# Patient Record
Sex: Female | Born: 1940 | Race: White | Hispanic: No | Smoking: Never smoker
Health system: Southern US, Community
[De-identification: ages and names within clinical notes are randomized; demographics above are authoritative.]

## PROBLEM LIST (undated history)

## (undated) ENCOUNTER — Inpatient Hospital Stay: Admission: EM | Payer: Self-pay

## (undated) DIAGNOSIS — K5790 Diverticulosis of intestine, part unspecified, without perforation or abscess without bleeding: Secondary | ICD-10-CM

## (undated) DIAGNOSIS — R112 Nausea with vomiting, unspecified: Secondary | ICD-10-CM

## (undated) DIAGNOSIS — K219 Gastro-esophageal reflux disease without esophagitis: Secondary | ICD-10-CM

## (undated) DIAGNOSIS — M1990 Unspecified osteoarthritis, unspecified site: Secondary | ICD-10-CM

## (undated) DIAGNOSIS — C50919 Malignant neoplasm of unspecified site of unspecified female breast: Secondary | ICD-10-CM

## (undated) DIAGNOSIS — E039 Hypothyroidism, unspecified: Secondary | ICD-10-CM

## (undated) DIAGNOSIS — Z5189 Encounter for other specified aftercare: Secondary | ICD-10-CM

## (undated) DIAGNOSIS — I700 Atherosclerosis of aorta: Secondary | ICD-10-CM

## (undated) DIAGNOSIS — C55 Malignant neoplasm of uterus, part unspecified: Secondary | ICD-10-CM

## (undated) DIAGNOSIS — T4145XA Adverse effect of unspecified anesthetic, initial encounter: Secondary | ICD-10-CM

## (undated) DIAGNOSIS — E7800 Pure hypercholesterolemia, unspecified: Secondary | ICD-10-CM

## (undated) DIAGNOSIS — Z800 Family history of malignant neoplasm of digestive organs: Secondary | ICD-10-CM

## (undated) DIAGNOSIS — K449 Diaphragmatic hernia without obstruction or gangrene: Secondary | ICD-10-CM

## (undated) DIAGNOSIS — K222 Esophageal obstruction: Secondary | ICD-10-CM

## (undated) DIAGNOSIS — T8859XA Other complications of anesthesia, initial encounter: Secondary | ICD-10-CM

## (undated) DIAGNOSIS — Z9889 Other specified postprocedural states: Secondary | ICD-10-CM

## (undated) DIAGNOSIS — I10 Essential (primary) hypertension: Secondary | ICD-10-CM

## (undated) DIAGNOSIS — H269 Unspecified cataract: Secondary | ICD-10-CM

## (undated) DIAGNOSIS — K649 Unspecified hemorrhoids: Secondary | ICD-10-CM

## (undated) DIAGNOSIS — E781 Pure hyperglyceridemia: Secondary | ICD-10-CM

## (undated) HISTORY — DX: Encounter for other specified aftercare: Z51.89

## (undated) HISTORY — PX: TOTAL KNEE ARTHROPLASTY: SHX125

## (undated) HISTORY — DX: Diverticulosis of intestine, part unspecified, without perforation or abscess without bleeding: K57.90

## (undated) HISTORY — DX: Essential (primary) hypertension: I10

## (undated) HISTORY — DX: Unspecified osteoarthritis, unspecified site: M19.90

## (undated) HISTORY — DX: Pure hyperglyceridemia: E78.1

## (undated) HISTORY — DX: Family history of malignant neoplasm of digestive organs: Z80.0

## (undated) HISTORY — PX: UPPER GASTROINTESTINAL ENDOSCOPY: SHX188

## (undated) HISTORY — DX: Hypothyroidism, unspecified: E03.9

## (undated) HISTORY — PX: OVARY SURGERY: SHX727

## (undated) HISTORY — DX: Esophageal obstruction: K22.2

## (undated) HISTORY — DX: Unspecified cataract: H26.9

## (undated) HISTORY — DX: Malignant neoplasm of unspecified site of unspecified female breast: C50.919

## (undated) HISTORY — DX: Unspecified hemorrhoids: K64.9

## (undated) HISTORY — DX: Diaphragmatic hernia without obstruction or gangrene: K44.9

## (undated) HISTORY — DX: Atherosclerosis of aorta: I70.0

## (undated) HISTORY — PX: FOOT SURGERY: SHX648

## (undated) HISTORY — PX: ROTATOR CUFF REPAIR: SHX139

## (undated) HISTORY — DX: Pure hypercholesterolemia, unspecified: E78.00

## (undated) HISTORY — DX: Malignant neoplasm of uterus, part unspecified: C55

## (undated) HISTORY — PX: COLONOSCOPY: SHX174

---

## 1980-07-15 HISTORY — PX: SIMPLE MASTECTOMY: SHX2409

## 1980-07-15 HISTORY — PX: MASTECTOMY, RADICAL: SHX710

## 1998-06-13 ENCOUNTER — Other Ambulatory Visit: Admission: RE | Admit: 1998-06-13 | Discharge: 1998-06-13 | Payer: Self-pay | Admitting: Obstetrics & Gynecology

## 1999-01-02 ENCOUNTER — Ambulatory Visit (HOSPITAL_BASED_OUTPATIENT_CLINIC_OR_DEPARTMENT_OTHER): Admission: RE | Admit: 1999-01-02 | Discharge: 1999-01-02 | Payer: Self-pay | Admitting: Plastic Surgery

## 1999-08-27 ENCOUNTER — Other Ambulatory Visit: Admission: RE | Admit: 1999-08-27 | Discharge: 1999-08-27 | Payer: Self-pay | Admitting: Obstetrics & Gynecology

## 1999-11-20 ENCOUNTER — Other Ambulatory Visit: Admission: RE | Admit: 1999-11-20 | Discharge: 1999-11-20 | Payer: Self-pay | Admitting: Obstetrics & Gynecology

## 1999-11-20 ENCOUNTER — Encounter (INDEPENDENT_AMBULATORY_CARE_PROVIDER_SITE_OTHER): Payer: Self-pay | Admitting: Specialist

## 2001-01-30 ENCOUNTER — Inpatient Hospital Stay (HOSPITAL_COMMUNITY): Admission: RE | Admit: 2001-01-30 | Discharge: 2001-02-01 | Payer: Self-pay | Admitting: Specialist

## 2001-04-13 ENCOUNTER — Encounter: Payer: Self-pay | Admitting: Emergency Medicine

## 2001-04-13 ENCOUNTER — Emergency Department (HOSPITAL_COMMUNITY): Admission: EM | Admit: 2001-04-13 | Discharge: 2001-04-13 | Payer: Self-pay | Admitting: Emergency Medicine

## 2001-04-27 ENCOUNTER — Other Ambulatory Visit: Admission: RE | Admit: 2001-04-27 | Discharge: 2001-04-27 | Payer: Self-pay | Admitting: Obstetrics & Gynecology

## 2001-07-31 ENCOUNTER — Encounter: Payer: Self-pay | Admitting: Specialist

## 2001-07-31 ENCOUNTER — Observation Stay (HOSPITAL_COMMUNITY): Admission: RE | Admit: 2001-07-31 | Discharge: 2001-08-01 | Payer: Self-pay | Admitting: Specialist

## 2002-12-01 ENCOUNTER — Other Ambulatory Visit: Admission: RE | Admit: 2002-12-01 | Discharge: 2002-12-01 | Payer: Self-pay | Admitting: Obstetrics & Gynecology

## 2004-02-17 ENCOUNTER — Other Ambulatory Visit: Admission: RE | Admit: 2004-02-17 | Discharge: 2004-02-17 | Payer: Self-pay | Admitting: Obstetrics & Gynecology

## 2006-09-03 ENCOUNTER — Ambulatory Visit: Payer: Self-pay | Admitting: Internal Medicine

## 2006-09-04 ENCOUNTER — Ambulatory Visit: Payer: Self-pay | Admitting: Internal Medicine

## 2006-10-21 ENCOUNTER — Ambulatory Visit: Payer: Self-pay | Admitting: Internal Medicine

## 2006-10-23 ENCOUNTER — Ambulatory Visit (HOSPITAL_COMMUNITY): Admission: RE | Admit: 2006-10-23 | Discharge: 2006-10-23 | Payer: Self-pay | Admitting: Gastroenterology

## 2006-10-30 ENCOUNTER — Ambulatory Visit: Payer: Self-pay | Admitting: Gastroenterology

## 2006-11-05 ENCOUNTER — Encounter: Payer: Self-pay | Admitting: Gastroenterology

## 2007-05-07 ENCOUNTER — Encounter: Payer: Self-pay | Admitting: Gastroenterology

## 2007-05-07 ENCOUNTER — Ambulatory Visit (HOSPITAL_COMMUNITY): Admission: RE | Admit: 2007-05-07 | Discharge: 2007-05-07 | Payer: Self-pay | Admitting: Gastroenterology

## 2007-05-15 ENCOUNTER — Ambulatory Visit: Payer: Self-pay | Admitting: Gastroenterology

## 2007-07-27 ENCOUNTER — Ambulatory Visit: Payer: Self-pay | Admitting: Internal Medicine

## 2007-08-06 ENCOUNTER — Ambulatory Visit: Payer: Self-pay | Admitting: Internal Medicine

## 2007-11-04 ENCOUNTER — Inpatient Hospital Stay (HOSPITAL_COMMUNITY): Admission: RE | Admit: 2007-11-04 | Discharge: 2007-11-08 | Payer: Self-pay | Admitting: Orthopedic Surgery

## 2008-09-27 ENCOUNTER — Telehealth (INDEPENDENT_AMBULATORY_CARE_PROVIDER_SITE_OTHER): Payer: Self-pay | Admitting: *Deleted

## 2008-11-21 ENCOUNTER — Inpatient Hospital Stay (HOSPITAL_COMMUNITY): Admission: RE | Admit: 2008-11-21 | Discharge: 2008-11-24 | Payer: Self-pay | Admitting: Orthopedic Surgery

## 2009-08-22 ENCOUNTER — Telehealth: Payer: Self-pay | Admitting: Gastroenterology

## 2009-10-19 ENCOUNTER — Encounter: Payer: Self-pay | Admitting: Gastroenterology

## 2009-10-19 ENCOUNTER — Telehealth: Payer: Self-pay | Admitting: Gastroenterology

## 2009-10-19 ENCOUNTER — Encounter (INDEPENDENT_AMBULATORY_CARE_PROVIDER_SITE_OTHER): Payer: Self-pay | Admitting: *Deleted

## 2009-10-19 DIAGNOSIS — K319 Disease of stomach and duodenum, unspecified: Secondary | ICD-10-CM | POA: Insufficient documentation

## 2009-10-20 ENCOUNTER — Telehealth (INDEPENDENT_AMBULATORY_CARE_PROVIDER_SITE_OTHER): Payer: Self-pay | Admitting: *Deleted

## 2009-11-16 ENCOUNTER — Ambulatory Visit: Payer: Self-pay | Admitting: Gastroenterology

## 2009-11-16 ENCOUNTER — Ambulatory Visit (HOSPITAL_COMMUNITY): Admission: RE | Admit: 2009-11-16 | Discharge: 2009-11-16 | Payer: Self-pay | Admitting: Gastroenterology

## 2010-08-14 NOTE — Miscellaneous (Signed)
Summary: eus  Clinical Lists Changes  Problems: Added new problem of OTHER SPECIFIED DISORDER OF STOMACH AND DUODENUM (ICD-537.89) Orders: Added new Test order of EUS-Upper (EUS-Upper) - Signed

## 2010-08-14 NOTE — Procedures (Signed)
Summary: eus report   EUS  Procedure date:  05/07/2007  Findings:      Location: [facility]   Patient Name: Vicki Cervantes, Vicki Cervantes MRN:  Procedure Procedures: Panendoscopy with EUSCPT: [id number].  Personnel: Endoscopist: Rachael Fee, MD.  Exam Location: Exam performed in Endoscopy Suite. Outpatient  Patient Consent: Procedure, Alternatives, Risks and Benefits discussed, consent obtained, from patient. Consent was obtained by the RN.  Indications Staging: gastric subepithelial lesion, last seen 4/08 (14.21mm by 11.86mm, round, contiguous with MP layer), here to check for interval growth.  History  Current Medications: Patient is not currently taking Coumadin.  Pre-Exam Physical: Performed May 07, 2007. Cardio-pulmonary exam, Abdominal exam, Mental status exam WNL.  Comments: Pt. history reviewed/updated, physical exam performed prior to initiation of sedation? yes Exam Exam: Images were taken.  Patient: ASA Classification: II. Tolerance: good.  Sedation Meds:  ~OBJECTIVE5Sedation Meds Patient assessed and found to be appropriate for moderate (conscious) sedation. Fentanyl 100 mcg. given IV. Versed 10 mg. given IV.  Monitoring: BP and pulse monitoring done. Oximetry was used. Supplemental O2 given.  EUS Scopes: Radial Echoendoscope used   Comments: Endoscopic findings (limited examination): 1. Normal esophagus. 2. Moderate amount of retained solid food in stomach.  1-2cm round, subepithelial lesion in proximal stomach, normal overlying mucosa.  Otherwise normal stomach. 3. Normal duodenum.  EUS findings: 1. 13.57mm by 13.78mm round, well demarcated, hypoechoic, homogeneous lesion that is contiguous with the muscularis propria layer of the proximal gastric wall.  This corresponds with the subepithelial lesion described above. 2. No perigastric adenopathy. 3. Limited views of pancreas, liver were all normal Assessment Upper GI Abnormal examination, see  findings above.  Comments: 13.2mm by 13.27mm subepithelial lesion that is contiguous with the muscularis propria layer of proximal gastric wall.  This is small and has not significantly changed in 6 months and so FNA was not performed (previously measured 14.72mm by 11.49mm).  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 Events  Unplanned Intervention: No intervention was required.  Unplanned Events: There were no complications. Plans Comments: My office will arrange for repeat EUS in 18 months to check for interval change.  If there is no significant growth, I do not recommend further surveillance.    This report was created from the original endoscopy report, which was reviewed and signed by the above listed endoscopist.

## 2010-08-14 NOTE — Progress Notes (Signed)
   Phone Note Outgoing Call   Summary of Call: Message left for pt. to call back NW:[id number] of EUS scheduled for 11/16/09 and need to know where to mail instructions . Initial call taken by: Teryl Lucy RN,  October 20, 2009 9:17 AM  Follow-up for Phone Call        Instructions mailed to pt. Follow-up by: Teryl Lucy RN,  October 24, 2009 10:25 AM

## 2010-08-14 NOTE — Progress Notes (Signed)
Summary: ready to sch proc   Phone Note Call from Patient Call back at [phone]   Caller: Patient Call For: Shabre Kreher Reason for Call: Talk to Nurse Summary of Call: Patient wants to schedule procedure (EUS) Initial call taken by: Tawni Levy,  October 19, 2009 11:34 AM  Follow-up for Phone Call        Message left for pt. that i will discuss with Dr.Jacob's and we will call her back to schedule   .sign .     Appended Document: ready to sch proc Pt. still in [place] until the end of April.Would like to be scheduled for 11/16/09 so told I will get her scheduled and call her back.  Appended Document: ready to sch proc PT. SCHEDULED FOR UPPER EUS ON 11/16/09.iNSTRUCTIONS DISCUSSED WITH PT. ON THE PHONE AND MAILED TO HER.

## 2010-08-14 NOTE — Procedures (Signed)
Summary: EUS   EUS  Procedure date:  11/05/2006  Findings:      Location: [facility]    EUS  Procedure date:  11/05/2006  Findings:      Location: [facility]   Patient Name: Vicki, Cervantes MRN:  Procedure Procedures: Panendoscopy with EUSCPT: [id number].  Personnel: Endoscopist: Rachael Fee, MD.  Exam Location: Exam performed in Endoscopy Suite. Outpatient  Patient Consent: Procedure, Alternatives, Risks and Benefits discussed, consent obtained, from patient. Consent was obtained by the RN.  Indications Staging: gastric subepithelial lesion, last seen 4/08 (14.54mm by 11.41mm, round, contiguous with MP layer), here to check for interval growth.  History  Current Medications: Patient is not currently taking Coumadin.  Pre-Exam Physical: Performed May 07, 2007. Cardio-pulmonary exam, Abdominal exam, Mental status exam WNL.  Comments: Pt. history reviewed/updated, physical exam performed prior to initiation of sedation? yes Exam Exam: Images were taken.  Patient: ASA Classification: II. Tolerance: good.  Sedation Meds:  ~OBJECTIVE5Sedation Meds Patient assessed and found to be appropriate for moderate (conscious) sedation. Fentanyl 100 mcg. given IV. Versed 10 mg. given IV.  Monitoring: BP and pulse monitoring done. Oximetry was used. Supplemental O2 given.  EUS Scopes: Radial Echoendoscope used   Comments: Endoscopic findings (limited examination): 1. Normal esophagus. 2. Moderate amount of retained solid food in stomach.  1-2cm round, subepithelial lesion in proximal stomach, normal overlying mucosa.  Otherwise normal stomach. 3. Normal duodenum.  EUS findings: 1. 13.78mm by 13.38mm round, well demarcated, hypoechoic, homogeneous lesion that is contiguous with the muscularis propria layer of the proximal gastric wall.  This corresponds with the subepithelial lesion described above. 2. No perigastric adenopathy. 3. Limited views  of pancreas, liver were all normal Assessment Upper GI Abnormal examination, see findings above.  Comments: 13.53mm by 13.18mm subepithelial lesion that is contiguous with the muscularis propria layer of proximal gastric wall.  This is small and has not significantly changed in 6 months and so FNA was not performed (previously measured 14.53mm by 11.70mm).  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 Events  Unplanned Intervention: No intervention was required.  Unplanned Events: There were no complications. Plans Comments: My office will arrange for repeat EUS in 18 months to check for interval change.  If there is no significant growth, I do not recommend further surveillance.   CC: Yancey Flemings, MD   This report was created from the original endoscopy report, which was reviewed and signed by the above listed endoscopist.

## 2010-08-14 NOTE — Procedures (Signed)
Summary: Endoscopic Ultrasound  Patient: Vicki Cervantes Note: All result statuses are Final unless otherwise noted.  Tests: (1) Endoscopic Ultrasound (EUS)  EUS Endoscopic Ultrasound                             DONE     [facility]     [address] 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  The  endoscope was introduced through the     mouth and advanced to the distal stomach.     <<PROCEDUREIMAGES>>           Endoscopic findings:     1. Normal esophagus     2. Medium to large amount of retained solid food in stomach,     patent pylorus     3. Previously noted smooth, round, subepithelial lesion in     proximal stomach was noted, appeared unchanged in size           EUS findings:     1. The subepithelial lesion above corresponded with a discrete,     solid, hypoechoic, homogeneous mass that communicates with the     muscularis propria layer of the proximal gastric wall. This     measured 15.63mm by 12.82mm maximally.     2. There was no perigastric or celiac adenopathy.           Impression:     15.58mm by 12.44mm round, discrete subepithelial lesion that may     have increased slightly in size since last EUS 2 1/2 years ago     (was 13.70mm by 13.39mm). This is either a leiomyoma or small GIST.     At this size (<2cm) surgical resection is  not necessary.  Dr.     Christella Hartigan' office will arrange for repeat EUS in 2 years to check for     further interval growth.     Retained solid food in stomach suggests gastroparesis, however she     has not had any signficant UGI symptoms.           ______________________________     Rachael Fee, MD           cc: Yancey Flemings, MD           n.     Rosalie Doctor:   Rachael Fee at 11/16/2009 09:14 AM           [name], [id number]  Note: An exclamation mark (!) indicates  a result that was not dispersed into the flowsheet. Document Creation Date: 11/16/2009 9:15 AM _______________________________________________________________________  (1) Order result status: Final Collection or observation date-time: 11/16/2009 09:03 Requested date-time:  Receipt date-time:  Reported date-time:  Referring Physician:   Ordering Physician: Rob Bunting [phone]) Specimen Source:  Source: Launa Grill Order Number: [id number] Lab site:   Appended Document: Endoscopic Ultrasound patty, she needs recall upper EUS in 2 years  Appended Document: Endoscopic Ultrasound    Clinical Lists Changes  Observations: Added new observation of EUS: 11/2011 (11/16/2009 9:19)

## 2010-08-14 NOTE — Progress Notes (Signed)
Summary: Schedule EUS   Phone Note Outgoing Call Call back at [facility] Phone [phone]   Call placed by: Harlow Mares CMA Duncan Dull),  August 22, 2009 9:34 AM Call placed to: Patient Summary of Call: patient needs EUS, 60 min upper radial +/- linear ( per paper chart recall ). Left message on patients machine to call back.  Initial call taken by: Harlow Mares CMA Duncan Dull),  August 22, 2009 9:34 AM  Follow-up for Phone Call        Left message on patients machine to call back.  Follow-up by: Harlow Mares CMA Duncan Dull),  August 24, 2009 4:14 PM

## 2010-08-14 NOTE — Letter (Signed)
Summary: EGD Instructions  [facility]  [address] [place], [place] [address]   Phone: [phone]  Fax: [phone]       Vicki Cervantes    Nov 09, 1940    MRN: [id number]       Procedure Day /Date:11/16/2009     Arrival Time:7:30 am     Procedure Time:8:30 am     Location of Procedure:                    _  _ [facility] (4th Floor) _ X _ [facility] ( Outpatient Registration) _  _ [facility] (Short Stay on [address]   PREPARATION FOR ENDOSCOPY/ eus   OnThursday 11/16/09 THE DAY OF THE PROCEDURE:  1.   No solid foods, milk or milk products are allowed after midnight the night before your procedure.  2.   Do not drink anything colored red or purple.  Avoid juices with pulp.  No orange juice.  3.  You may drink clear liquids until4:30 a.m. which is 4 hours before your procedure.                                                                                                CLEAR LIQUIDS INCLUDE: Water Jello Ice Popsicles Tea (sugar ok, no milk/cream) Powdered fruit flavored drinks Coffee (sugar ok, no milk/cream) Gatorade Juice: apple, white grape, white cranberry  Lemonade Clear bullion, consomm, broth Carbonated beverages (any kind) Strained chicken noodle soup Hard Candy   MEDICATION INSTRUCTIONS  Unless otherwise instructed, you should take regular prescription medications with a small sip of water as early as possible the morning of your procedure.       OTHER INSTRUCTIONS  You will need a responsible adult at least 71 years of age to accompany you and drive you home.   This person must remain in the waiting room during your procedure.  Wear loose fitting clothing that is easily removed.  Leave jewelry and other valuables at home.  However, you may wish to bring a book to read or an iPod/MP3 player to listen to music as you wait for your procedure to start.  Remove all body piercing jewelry and leave at  home.  Total time from sign-in until discharge is approximately 2-3 hours.  You should go home directly after your procedure and rest.  You can resume normal activities the day after your procedure.  The day of your procedure you should not:   Drive   Make legal decisions   Operate machinery   Drink alcohol   Return to work  You will receive specific instructions about eating, activities and medications before you leave.    The above instructions have been reviewed and explained to me by  Elnita Maxwell -10/19/09-mailed to pt.

## 2010-10-23 LAB — PROTIME-INR
INR: 1.1 (ref 0.00–1.49)
INR: 1.5 (ref 0.00–1.49)
INR: 1.9 — ABNORMAL HIGH (ref 0.00–1.49)
Prothrombin Time: 14 seconds (ref 11.6–15.2)
Prothrombin Time: 15 seconds (ref 11.6–15.2)

## 2010-10-23 LAB — TYPE AND SCREEN
ABO/RH(D): A POS
Antibody Screen: NEGATIVE

## 2010-10-23 LAB — CBC
HCT: 22.6 % — ABNORMAL LOW (ref 36.0–46.0)
HCT: 24.1 % — ABNORMAL LOW (ref 36.0–46.0)
HCT: 27.3 % — ABNORMAL LOW (ref 36.0–46.0)
HCT: 36.2 % (ref 36.0–46.0)
Hemoglobin: 12.6 g/dL (ref 12.0–15.0)
Hemoglobin: 7.9 g/dL — CL (ref 12.0–15.0)
MCHC: 34.7 g/dL (ref 30.0–36.0)
MCHC: 35.2 g/dL (ref 30.0–36.0)
MCHC: 35.5 g/dL (ref 30.0–36.0)
MCV: 90.5 fL (ref 78.0–100.0)
Platelets: 155 10*3/uL (ref 150–400)
Platelets: 158 10*3/uL (ref 150–400)
Platelets: 166 10*3/uL (ref 150–400)
Platelets: 183 10*3/uL (ref 150–400)
RDW: 12.6 % (ref 11.5–15.5)
RDW: 12.6 % (ref 11.5–15.5)
RDW: 12.8 % (ref 11.5–15.5)
RDW: 12.9 % (ref 11.5–15.5)
RDW: 13 % (ref 11.5–15.5)
WBC: 10 10*3/uL (ref 4.0–10.5)
WBC: 6.8 10*3/uL (ref 4.0–10.5)
WBC: 8.4 10*3/uL (ref 4.0–10.5)

## 2010-10-23 LAB — BASIC METABOLIC PANEL
BUN: 11 mg/dL (ref 6–23)
BUN: 6 mg/dL (ref 6–23)
CO2: 29 mEq/L (ref 19–32)
CO2: 29 mEq/L (ref 19–32)
Calcium: 7.9 mg/dL — ABNORMAL LOW (ref 8.4–10.5)
Chloride: 99 mEq/L (ref 96–112)
Creatinine, Ser: 0.94 mg/dL (ref 0.4–1.2)
Creatinine, Ser: 1.04 mg/dL (ref 0.4–1.2)
GFR calc non Af Amer: 59 mL/min — ABNORMAL LOW (ref >60)
Glucose, Bld: 134 mg/dL — ABNORMAL HIGH (ref 70–99)
Glucose, Bld: 148 mg/dL — ABNORMAL HIGH (ref 70–99)

## 2010-10-23 LAB — URINALYSIS, ROUTINE W REFLEX MICROSCOPIC
Bilirubin Urine: NEGATIVE
Glucose, UA: NEGATIVE mg/dL
Hgb urine dipstick: NEGATIVE
Ketones, ur: NEGATIVE mg/dL
Nitrite: NEGATIVE
Protein, ur: NEGATIVE mg/dL
Specific Gravity, Urine: 1.02 (ref 1.005–1.030)
Urobilinogen, UA: 0.2 mg/dL (ref 0.0–1.0)
pH: 6.5 (ref 5.0–8.0)

## 2010-10-23 LAB — COMPREHENSIVE METABOLIC PANEL
Alkaline Phosphatase: 80 U/L (ref 39–117)
BUN: 25 mg/dL — ABNORMAL HIGH (ref 6–23)
Calcium: 9.7 mg/dL (ref 8.4–10.5)
GFR calc non Af Amer: 50 mL/min — ABNORMAL LOW (ref >60)
Glucose, Bld: 105 mg/dL — ABNORMAL HIGH (ref 70–99)
Total Protein: 7 g/dL (ref 6.0–8.3)

## 2010-10-23 LAB — PREPARE RBC (CROSSMATCH)

## 2010-11-27 NOTE — Op Note (Signed)
NAMEREBACCA, VOTAW NO.:  [id number]   MEDICAL RECORD NO.:  [id number]          PATIENT TYPE:  INP   LOCATION:  0002                         FACILITY:  [facility]   PHYSICIAN:  Ollen Gross, M.D.    DATE OF BIRTH:  13-Oct-1940   DATE OF PROCEDURE:  11/04/2007  DATE OF DISCHARGE:                               OPERATIVE REPORT   PREOPERATIVE DIAGNOSIS:  Osteoarthritis right knee.   POSTOPERATIVE DIAGNOSIS:  Osteoarthritis right knee.   PROCEDURE:  Right total knee arthroplasty.   SURGEON:  Ollen Gross, M.D.   ASSISTANT:  Alexzandrew L. Perkins, P.A.C.   ANESTHESIA:  General with postop Marcaine pain pump.   ESTIMATED BLOOD LOSS:  Minimal.   DRAIN:  None.   TOURNIQUET TIME:  29 minutes at 300 mmHg.   COMPLICATIONS:  None.   CONDITION:  Stable to recovery.   BRIEF CLINICAL NOTE:  [name] is a 70 year old female who has end-  stage arthritis of both knees with the right more symptomatic than the  left.  She has had a previous unicompartmental replacement on the left  which has failed.  She has end-stage arthritis on the right and presents  now for right total knee arthroplasty.   PROCEDURE IN DETAIL:  After the successful administration of general  anesthetic, a tourniquet was placed high on the right thigh and the  right lower extremity prepped and draped in the usual sterile fashion.  The extremity was wrapped in Esmarch, knee flexed and the tourniquet  inflated to 300 mmHg.  A midline incision was made with a 10 blade  through subcutaneous tissue to the level of the extensor mechanism.  A  fresh blade was used to make a medial parapatellar arthrotomy.  Soft  tissue over the proximal medial tibia was subperiosteally elevated to  the joint line with a knife and into the semimembranosus bursa with a  Cobb elevator.  The soft tissue laterally was elevated with attention  being paid to avoid any patellar tendon on the tibial tubercle.  The  patella  subluxed laterally.  The knee flexed 90 degrees, ACL and PCL  removed.  A drill was used to create a starting hole in the distal femur  and the canal was thoroughly irrigated.  The 5 degree right valgus  alignment guide was placed and _referencing off the  posterior condyles  rotation is marked and the block pinned to remove 10 mm off the distal  femur.  Distal femoral resection was made with an oscillating saw.  A  sizing block was placed and size 2.5 was the most appropriate.  Rotation  was marked at the epicondylar access and a size 2.5 cutting block  placed.  The anterior, posterior and chamfer cuts were then made.   The tibia was subluxed forward and the menisci removed.  Extramedullary  tibial alignment guide was placed referencing proximally at the medial  aspect of the tibial tubercle and distally along the second metatarsal  access and tibial crest.  The block is pinned to remove 10 mm of the  nondeficient lateral side.  Tibial  resection was made with an  oscillating saw.  A size 2.5 was also the most appropriate tibial  component and the proximal tibia was prepared with a modular drill and  keel punch for a size 2.5.  Femoral preparation was completed with an  intercondylar cut.   The size 2.5 mobile barring tibial trial, size 2.5 posterior stabilized  femoral trial and a 10 mm posterior stabilized rotating platform insert  trial was placed.  With a 10, she slightly hyperextended, so we went to  a 12.5, which allowed for full extension with excellent varus and valgus  balance, as well as anterior-posterior balance throughout full range of  motion.  The patella was then everted and the thickness measured to be  20 mm.  Free hand resection was taken down to 12 mm, 35 template was  placed, lug holes were drilled, trial patella was placed and it tracked  normally.  Osteophytes were removed off the posterior femur with the  trial in place.  All trials were removed and then the cut  down surfaces  were prepared with pulsatile lavage.  Cement was mixed and once ready  for implantation, the size 2.5 mm mobile barring tibial tray, 2.5  posterior stabilized femur and 35 patella were cemented into place and  the patella was held with a clamp.  Trial 12.5 mm insert was placed,  knee held in full extension and all extruded cement removed.  Once the  cement was fully hardened, then FloSeal was injected on the posterior  capsule and the permanent 12.5 mm posterior stabilized rotating platform  insert was placed into the tibial tray.  FloSeal was injected in the  medial and lateral gutters and suprapatellar area.  A moist sponge was  placed and then tourniquet released for a total time of 29 minutes.  The  sponge was held for 2 minutes and then removed.  Minimal bleeding was  encountered.  That which was encountered was stopped with  electrocautery.  The wound was then further irrigated with saline  solution with pulsatile lavage.  The extensor mechanism was then closed  with interrupted #1 PDS.  Flexion against gravity is 140 degrees.  The  subcu was then closed with interrupted 2-0 Vicryl and subcuticular  running 4-0 Monocryl.  The incision was cleaned and dried and Steri-  Strips and a bulky sterile dressing applied.  She was then awakened and  transported to recovery in stable condition.      Ollen Gross, M.D.  Electronically Signed     FA/MEDQ  D:  11/04/2007  T:  11/04/2007  Job:  [id number]

## 2010-11-27 NOTE — Op Note (Signed)
NAMELAYCI, STENGLEIN                ACCOUNT NO.:  [id number]   MEDICAL RECORD NO.:  [id number]          PATIENT TYPE:  INP   LOCATION:  0003                         FACILITY:  [facility]   PHYSICIAN:  Ollen Gross, M.D.    DATE OF BIRTH:  09-10-1940   DATE OF PROCEDURE:  11/21/2008  DATE OF DISCHARGE:                               OPERATIVE REPORT   PREOPERATIVE DIAGNOSIS:  Failed left knee unicompartmental replacement.   POSTOPERATIVE DIAGNOSIS:  Failed left knee unicompartmental replacement.   PROCEDURES:  Revision of left knee unicompartmental replacement to total  knee arthroplasty.   SURGEON:  F. Aluisio, M.D.   ASSISTANT:  Avel Peace PA-C   ANESTHESIA:  Spinal.   ESTIMATED BLOOD LOSS:  Minimal.   DRAIN:  None.   TOURNIQUET TIME:  34 minutes at 300 mmHg.   COMPLICATIONS:  None.   CONDITION:  Stable to recovery room.   BRIEF CLINICAL NOTE:  Ms. Ziemba is a 70 year old female who has had a  painful left unicompartmental knee replacement for several years now.  Radiographs have shown that the tibial component has tilted and  migrated.  She also has compensatory arthritis which has formed  patellofemoral and lateral compartments.  She presents now for revision  to a total knee arthroplasty.   PROCEDURE IN DETAIL:  After successful administration of spinal  anesthetic, a tourniquet is placed on the left thigh and left lower  extremity is prepped and draped in the usual sterile fashion.  Extremities wrapped in Esmarch, knee flexed, tourniquet inflated to 300  mmHg.  Midline incision is made with 10 blade through subcutaneous  tissue to the level of the extensor mechanism.  A fresh blade is used to  make a medial parapatellar arthrotomy.  It is noted that the femoral  component appears somewhat tilted and is not centered on the medial  condyle.  This also growth of spurs overlying the metallic femoral  component.  The soft tissue of the proximal medial tibia  subperiosteally  elevated to the joint line with the knife into the semimembranosus bursa  with a Cobb elevator.  Soft tissue laterally is elevated with attention  being paid to avoid the patellar tendon on tibial tubercle.  Patella  subluxed laterally, knee flexed 90 degrees and PCL removed.  The ACL was  already gone.  I used a quarter inch osteotome to lift the femoral  component off of the bone.  It was removed very easily.  We then used a  drill  to create a starting hole in the distal femur.  The canal is  thoroughly irrigated and the 5-degree left valgus alignment guide is  placed.  We referenced off the posterior condyles for rotation and  pinned block to remove 10 mm off the distal femur.  Distal femoral  resection is made with an oscillating saw.  A sizing block is placed,  size 2 is most appropriate.  We were careful to utilize the epicondylar  axis for rotation as there was a deficiency in the posterior femur which  would have led to too much external rotation if  we referenced off that.  Using the epicondylar axis for rotation, we marked rotation and the  block is pinned.  The anterior-posterior and chamfer cuts were then made  for a size 2.   The tibia is subluxed forward and the menisci are removed.  The tibial  component is somewhat subsided.  We placed the extramedullary cutting  guide referencing at the medial aspect of the tibial tubercle and  distally along the second metatarsal axis and the tibial crest.  I  placed the block to remove about 2 mm below the subsided tibial  component.  Tibial resection is made with an oscillating saw.  We got  below the component and the bone looked great medially.  Size 2 is the  most appropriate tibial component and we prepared the proximal tibia  with the modular drill and keel punch for the MBT revision tray size 2.  We then did the intercondylar cut on the femur for a size 2 and placed  the size 2 posterior stabilized femur.  I  then placed a 15-mm posterior  stabilized rotating platform insert.  With the 15, full extension is  achieved with excellent varus-valgus and anterior-posterior balance  throughout full range of motion.  The patella is then everted and  thickness measured to be 22 mm.  Freehand resection is taken to 12 mm,  35 template is placed, lug holes are drilled, trial patella is placed  and it tracks normally.  Osteophytes are removed off the posterior femur  with the trial in place.  All trials removed and the cut bone surfaces  are prepared with pulsatile lavage.  Cement is mixed and once ready for  implantation, the size 2 MBT revision tray size 2 posterior stabilized  femur and 35 patella are cemented into place.  The patella is held with  the clamp.  Trial 15-mm inserts placed, knee held in full extension and  all extruded cement removed.  When the cement is fully hardened, then  the permanent 15 mm posterior stabilized rotating platform insert is  placed into the tibial tray.  The wound is copiously irrigated with  saline solution and FloSeal injected on the posterior capsule,  mediolateral gutters and suprapatellar area.  A moist sponge is placed  and tourniquet released for a total time of 34 minutes.  Sponges held  for 2 minutes and then removed.  Minimal bleeding is encountered.  The  bleeding that is encountered is stopped with electrocautery.  Wounds  copiously irrigated with saline solution and then the arthrotomy closed  with interrupted #1 PDS.  Flexion against gravity is about 140 degrees.  Subcu is closed with interrupted 2-0 Vicryl and subcuticular running 4-0  Monocryl.  Incisions cleaned and dried and Steri-Strips and a bulky  sterile dressing applied.  She is then placed into a knee immobilizer,  awakened and transferred to recovery in stable condition.      Ollen Gross, M.D.  Electronically Signed     FA/MEDQ  D:  11/21/2008  T:  11/21/2008  Job:  [id number]

## 2010-11-27 NOTE — H&P (Signed)
NAMEKAETLIN, BULLEN                ACCOUNT NO.:  [id number]   MEDICAL RECORD NO.:  [id number]          PATIENT TYPE:  INP   LOCATION:  NA                           FACILITY:  [facility]   PHYSICIAN:  Ollen Gross, M.D.    DATE OF BIRTH:  15-Oct-1940   DATE OF ADMISSION:  11/21/2008  DATE OF DISCHARGE:                              HISTORY & PHYSICAL   DATE OF OFFICE VISIT HISTORY AND PHYSICAL:  Nov 15, 2008.   CHIEF COMPLAINT:  Right knee pain.   HISTORY OF PRESENT ILLNESS:  The patient is a 70 year old female who has  been seen by Dr. Lequita Halt for ongoing left knee pain.  She has known  arthritis that has been progressive in nature.  She has undergone a  right total knee and is felt to benefit from undergoing a left knee.  The risks and benefits have been discussed, she would like to proceed  with surgery.   ALLERGIES:  No known drug allergies.   CURRENT MEDICATIONS:  Levothroid, Celebrex, Evista, Diovan HCT, Lipitor,  Lexapro, omeprazole.   PAST MEDICAL HISTORY:  1. Breast cancer.  2. Insomnia.  3. Hypertension.  4. Hypercholesterolemia.  5. Reflux disease.  6. History of esophageal strictures.  7. Hypothyroidism.  8. Postmenopausal.   PAST SURGICAL HISTORY:  1. Right total knee April 2009.  2. Ovarian cyst excision.  3. Radical left mastectomy and then a right simple mastectomy.  4. Left knee surgery.  5. Shoulder surgery.  6. EGD with esophageal dilatation.   SOCIAL HISTORY:  Married, nonsmoker.  One glass of wine.  She does have  a caregiver lined up.  Two level home with 2 steps entering.   FAMILY HISTORY:  Mother with hypertension.   REVIEW OF SYSTEMS:  GENERAL:  No fevers, chills, night sweats.  NEURO:  No seizures, syncope, paralysis.  RESPIRATORY:  No shortness breath,  productive cough or hemoptysis.  CARDIOVASCULAR:  No chest pain, angina,  orthopnea.  GI:  No nausea, vomiting, diarrhea or constipation.  GU: No  dysuria, hematuria or discharge.   MUSCULOSKELETAL:  Left knee.   PHYSICAL EXAMINATION:  VITAL SIGNS:  Pulse 68, respirations 14, blood  pressure 138/72.  GENERAL:  A 70 year old white female well-nourished, well-developed, no  acute distress.  She is alert and cooperative, pleasant, an excellent  historian.  HEENT:  Normocephalic, atraumatic.  Pupils are round and reactive.  EOMs  intact.  NECK:  Supple.  CHEST:  Clear.  HEART:  Regular rate and rhythm.  No murmur.  ABDOMEN:  Soft, nontender.  Bowel sounds present.  RECTAL/BREAST/GENITALIA:  Not done, not pertinent to present illness.  EXTREMITIES:  Left knee motor function intact, no effusion, moderate  crepitus.   IMPRESSION:  Osteoarthritis, left knee.   PLAN:  The patient admitted to [facility].  She will undergo a  left total knee arthroplasty.  Surgery will be performed by Ollen Gross.      Alexzandrew L. Perkins, P.A.C.      Ollen Gross, M.D.  Electronically Signed    ALP/MEDQ  D:  11/20/2008  T:  11/21/2008  Job:  [id number]   cc:   Ollen Gross, M.D.  Fax: [phone]   Geoffry Paradise, M.D.  Fax: [phone]

## 2010-11-27 NOTE — Discharge Summary (Signed)
Vicki Cervantes, Vicki Cervantes                ACCOUNT NO.:  [id number]   MEDICAL RECORD NO.:  [id number]          PATIENT TYPE:  INP   LOCATION:  1611                         FACILITY:  [facility]   PHYSICIAN:  Ollen Gross, M.D.    DATE OF BIRTH:  23-Feb-1941   DATE OF ADMISSION:  11/21/2008  DATE OF DISCHARGE:  11/24/2008                               DISCHARGE SUMMARY   ADMITTING DIAGNOSES:  1. 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  2. Breast cancer.  3. Insomnia.  4. Hypertension.  5. Hypercholesterolemia.  6. Reflux disease.  7. History of esophageal strictures.  8. Hypothyroidism.  9. Postmenopausal.   DISCHARGE DIAGNOSES:  1. Failed left unicompartmental replacement with osteoarthritis,      status post revision, conversion of left Uni-Knee over to a left      total knee arthroplasty.  2. Postoperative acute blood loss anemia.  3. Status post transfusion without sequelae.  4. Postoperative hyponatremia, improved.  5. Postoperative hypokalemia, improved.   Remaining discharge diagnoses same as admitting diagnoses.   PROCEDURE:  Nov 21, 2008:  Revision of previous left Uni-Knee,  conversion over to left total knee.  Surgeon Dr. Lequita Halt, assistant Avel Peace, PA-C.  Spinal anesthesia.  Tourniquet time 34 minutes.   CONSULTS:  None.   BRIEF HISTORY:  Ms. Halbleib is a 70 year old female with a painful left  unicompartmental replacement for years now.  Radiographs show tibial  component tilted and migrated, she has also developed some  osteoarthritis in the compartments.  Now presents for total knee  arthroplasty.   LABORATORY DATA:  Preop CBC showed hemoglobin 12.6, hematocrit 36.2,  white cell count 6.5, platelets 219, PT/INR preoperative 14 and 1.1 with  PTT of 26.  Chem panel on admission:  Slightly elevated BUN at 25,  remaining Chem panel within normal limits.  Preoperative UA was  negative.  Serial CBCs were followed, hemoglobin dropped down to 9.7 and  8.3, the lowest it got was a hemoglobin of 7.9 with a crit of 22.6, did  receive 1 unit of blood prior to discharge and we will recheck CBC on an  outpatient basis.  Serial ProTimes followed per Coumadin protocol, last  known PT/INR 22.4/1.9.  Chem panel on admission:  Again, only elevated  BUN at 25.  Serial BMETs were followed, sodium dropped from 139 to 134  and back up to 136, potassium dropped 4.7 to 3.3 back up to 4.1, BUN  came down to normal level at 6.   EKG Nov 14, 2008:  Normal sinus rhythm, normal EKG confirmed by Dr.  Nanetta Batty.   X-rays:  She has two-view chest on July 31, 2001, no active disease.   HOSPITAL COURSE:  The patient admitted to [facility], taken to  the OR, underwent above-stated procedure without complication.  The  patient tolerated procedure well, later transferred to the recovery room  orthopedic floor.  Doing pretty well on the morning of day one, she got  a little bit a rest after surgery.  Her hemoglobin was down to 9.7,  asymptomatic with this  and she did have a drop in her sodium and  potassium which was felt to be due to a delusional component.  We  decreased her fluids and she had excellent output.  Started back on her  home medications.  Started getting up with therapy, walked short  distance on day one and got up to over 130 feet on day two.  Dressing  changed, incision looked good.  Hemoglobin was down a little bit further  8.3, she was asymptomatic with this.  Sodium was back up and potassium  was back up, responded to the decrease and discontinuance of fluids on  day two and also the supplementation of potassium.  She was seen on  rounds on morning of day three.  She had a little bit of dizziness on  the evening of day two after walking so we decided give her 1 unit of  blood since hemoglobin was low at 7.9, otherwise she was meeting her  goals and doing well enough with therapy.  Her sodium was back up and  potassium was back  up the day before.  Gave her 1 unit of blood and  discharged her home later that day.   DISCHARGE PLAN:  Patient discharged home on Nov 24, 2008.   DISCHARGE DIAGNOSES:  Please see above.   DISCHARGE MEDICATIONS:  Percocet, Robaxin, Nu-Iron, Coumadin.   FOLLOW-UP:  In 2 weeks.   DISPOSITION:  Home.   ACTIVITY:  Total knee protocol.  Home health PT and home health nursing.   CONDITION UPON DISCHARGE:  Improved.      Alexzandrew L. Perkins, P.A.C.      Ollen Gross, M.D.  Electronically Signed    ALP/MEDQ  D:  11/24/2008  T:  11/24/2008  Job:  [id number]   cc:   Ollen Gross, M.D.  Fax: [phone]   Geoffry Paradise, M.D.  Fax: [phone]

## 2010-11-30 NOTE — Assessment & Plan Note (Signed)
[facility]                         GASTROENTEROLOGY OFFICE NOTE   SHAUGHNESSY, GETHERS                       MRN:          [id number]  DATE:09/03/2006                            DOB:          [birth year]    REFERRING PHYSICIAN:  Geoffry Paradise, M.D.   OFFICE CONSULTATION NOTE   REASON FOR CONSULTATION:  Dysphagia.   HISTORY:  This is a pleasant, 70 year old white female with a history of  hypertension, hyperlipidemia, hypothyroidism, osteoarthritis, and a  remote history of breast cancer.  She is referred through the courtesy  of Dr. Jacky Kindle regarding worsening dysphagia.  The patient was seen in  this facility on one occasion, March 25, 2002, for screening  colonoscopy.  Examination revealed diverticulosis and dimunitive colon  polyps which were removed and found to be hyperplastic.  She has not  been seen since.  She reports, now, a 1-year history of intermittent  progressive, solid food dysphagia.  This on the background of very minor  occasional indigestion and heartburn.  She has had to regurgitate food  on occasion for relief.  She denies odynophagia, melena, or weight loss.  No abdominal pain or other particular complaints.  About 3 weeks ago,  she was placed on Protonix 40 mg daily.  She is not certain, though, she  feels this may have been somewhat helpful.   PAST MEDICAL HISTORY:  As above.   PAST SURGICAL HISTORY:  Status post left radical mastectomy as well as  right simple mastectomy.  It should be noted that the patient did not  receive radiation therapy.   ALLERGIES:  No known drug allergies.   CURRENT MEDICATIONS:  1. Celebrex 20 mg daily.  2. Levothroid 100 mcg daily.  3. Evista 60 mg daily.  4. Diovan 12.5 mg b.i.d.  5. Lipitor 20 mg daily.  6. Protonix 40 mg daily.   FAMILY HISTORY:  No family history of gastrointestinal malignancy.   SOCIAL HISTORY:  The patient is married with 3 children.  She lives with  her  husband, Rosanne Ashing, who is also a patient.  She does not smoke.  She  occasionally uses alcohol.   REVIEW OF SYSTEMS:  Per diagnostic evaluation form.   PHYSICAL EXAM:  Well-appearing female in no acute distress.  Blood pressure is 128/72.  Heart rate is 68 and regular.  Weight is  141.2 pounds.  She is 5 feet 3-1/2 inches in height.  HEENT:  Sclerae are anicteric.  Conjunctivae are pink.  Oral mucosa is  intact.  There is no adenopathy.  LUNGS:  Clear to auscultation and percussion.  HEART:  Regular without murmur.  ABDOMEN:  Soft, nontender, nondistended with good bowel sounds.  No  organomegaly, mass, or hernia.  EXTREMITIES:  Without edema.   IMPRESSION:  This is a 70 year old female with progressive intermittent  solid food dysphagia.  She most likely has an esophageal stricture  secondary to reflux disease.   RECOMMENDATIONS:  1. Continue Protonix.  2. Schedule upper endoscopy with probable esophageal dilation.  The      nature of the procedure as well as the risks,  benefits, and      alternatives were discussed in detail as well as supplemental      literature on the topic and procedure provided.     Wilhemina Bonito. Marina Goodell, MD  Electronically Signed    JNP/MedQ  DD: 09/03/2006  DT: 09/03/2006  Job #: [id number]   cc:   Geoffry Paradise, M.D.

## 2010-11-30 NOTE — H&P (Signed)
NAMEKATISHA, SHIMIZU NO.:  [id number]   MEDICAL RECORD NO.:  [id number]          PATIENT TYPE:  INP   LOCATION:  1621                         FACILITY:  [facility]   PHYSICIAN:  Ollen Gross, M.D.    DATE OF BIRTH:  Sep 05, 1940   DATE OF ADMISSION:  11/04/2007  DATE OF DISCHARGE:  11/08/2007                              HISTORY & PHYSICAL   CHIEF COMPLAINT:  Right knee pain.   HISTORY OF PRESENT ILLNESS:  The patient is a 70 year old female who has  been seen by Dr. Lequita Halt for ongoing knee pain.  She is known to have  advanced degenerative changes in the right knee. The left knee has an  unfortunate failed unicompartmental replacement. Continues to be  progressive pain and dysfunction and it is felt she would benefit from  undergoing surgical intervention.  Risks and benefits discussed, and the  patient was subsequently admitted to the hospital.  She has been seen  preoperatively by Dr. Jacky Kindle and felt to be medically stable.   ALLERGIES:  No known drug allergies.   CURRENT MEDICATIONS:  Levothyroxine, Celebrex, Evista, Diovan, Lipitor,  Lexapro, Protonix, baby aspirin.   PAST MEDICAL HISTORY:  Breast cancer, insomnia, hypercholesterolemia,  hypertension, reflux disease, esophageal strictures, hypothyroidism,  postmenopausal.   PAST SURGICAL HISTORY:  Ovarian cyst removal, benign.  Radical left  mastectomy and then a right simple mastectomy. Left knee surgery,  shoulder surgery, EGD with esophageal dilatation.   SOCIAL HISTORY:  Married, housewife, nonsmoker.  One drink of alcohol  daily.  Three children.   FAMILY HISTORY:  Mother with hypertension.   REVIEW OF SYSTEMS:  GENERAL:  No fevers, chills, night sweats.  NEURO:  No seizures or paralysis.  Respiratory: No shortness of breath,  productive cough, or hemoptysis.  CARDIOVASCULAR:  No chest pain or  orthopnea. GI: No nausea, vomiting, diarrhea, or constipation.  GU: No  dysuria or hematuria or  discharge.  MUSCULOSKELETAL:  Right knee pain.   PHYSICAL EXAMINATION:  VITAL SIGNS: Pulse 88, respirations 16, and blood  pressure 130/76.  GENERAL:  A 70 year old female, well-nourished, well-developed, in no  acute distress.  She is alert, oriented, and cooperative, very pleasant.  HEENT: Normocephalic, atraumatic.  Pupils are equal, round, and  reactive.  Oropharynx is clear.  EOM's intact.  NECK:  Supple.  No bruits.  CHEST: Clear anterior and posterior chest walls.  HEART:  Regular rate and rhythm.  No murmur, S1-S2 noted.  ABDOMEN: Soft, nontender.  Bowel sounds present.  Rectal, breast, and pelvis exams were not done, not pertinent to present  illness.  EXTREMITIES:  Right knee range of motion 5-1/10, marked crepitus, tender  more medial than lateral.   IMPRESSION:  Osteoarthritis right knee.   PLAN:  The patient was admitted to [facility] to undergo a  right total knee replacement and arthroplasty.  Surgery will be  performed by Dr. Ollen Gross.      Alexzandrew L. Perkins, P.A.C.      Ollen Gross, M.D.  Electronically Signed    ALP/MEDQ  D:  11/09/2007  T:  11/09/2007  Job:  [id number]   cc:   Ollen Gross, M.D.  Fax: [phone]   Wilhemina Bonito. Marina Goodell, MD  [address]  [place]  [place] [address]   Geoffry Paradise, MD  Fax: [phone]

## 2010-11-30 NOTE — Op Note (Signed)
[facility]  Patient:    Vicki Cervantes, Vicki Cervantes                       MRN: [id number] Proc. Date: 01/29/01 Adm. Date:  [id number] Attending:  Montez Morita, Philips John                           Operative Report  PREOPERATIVE DIAGNOSIS:  Rotator cuff tear left shoulder.  POSTOPERATIVE DIAGNOSIS:  Rotator cuff tear left shoulder.  PROCEDURE PERFORMED:  Repair.  ANESTHESIA:  General.  SURGEON:  Philips J. Montez Morita, M.D.  ASSISTANT:  Alexzandrew L. Perkins, P.A.-C.  DESCRIPTION OF PROCEDURE:  After suitable general anesthesia, she is positioned on the shoulder table, prepped, and draped routinely.  An incision is made over the anterior acromion and initially into the deltoid.  The anterior deltoid was removed from the anterior acromion along with the coracoacromial ligament.  Using a bevel saw, the anterior acromion is removed beveling the undersurface of the remaining acromion.  The tear included the supraspinatus and all the infraspinatus with a good bit of retraction. Attempt was made to put one metal fastener, but it would not hold in the bone, so a trough was created, with three drill holes in the trough, and using three interrupted sutures pulling the tendons back into bone.  This was with #1 Ethibond.  Some #1 PDS was then used to oversew the areas side-to-side and to reinforce with soft tissue tendon that was left behind overlapping the other than had been pulled into the trough.  Good repair was felt to have achieved. Three drill holes in the acromion to reattach the deltoid with #1 PDS and a couple of sutures of the same in the deltoid fascia that had been split.  A 2-0 Vicryl and 4-0 Monocryl on the skin.  A pain pump was introduced with 10 cc of 5% plain Marcaine subacromially, and then a pain pump to administer 4 cc an hour for the next four days.  She was placed into an abduction sling and goes to recovery in good condition. DD:  01/29/01 TD:   01/30/01 Job: [id number]

## 2010-11-30 NOTE — H&P (Signed)
[facility]  Patient:    Vicki Cervantes, Vicki Cervantes Visit Number: [id number] MRN: [id number]          Service Type: Attending:  Philips J. Montez Morita, M.D. Dictated by:   Druscilla Brownie. Shela Nevin, P.A. Adm. Date:  07/31/01   CC:         Richard A. Jacky Kindle, M.D.   History and Physical  DATE OF BIRTH:  07-May-2041  CHIEF COMPLAINT:  "Pain in my left knee."  HISTORY OF PRESENT ILLNESS:  This 70 year old white female, a patient of Philips J. Montez Morita, M.D., has been seen for continuing progressive problems concerning her left knee.  She has developed bone on bone medial compartmental arthritis to the left medial knee.  She is a very active lady.  She is having more and more difficulties getting about due to this problem.  After much consideration, it is felt she will benefit with left Repicci unicompartmental knee replacement to the left knee.  This is to be done at [facility].  PAST MEDICAL HISTORY:  The patient has recently undergone left shoulder surgery and has done quite well with that.  She has had breast cancer in the past and has had bilateral mastectomies.  She had knee arthroscopy to the left knee in 1998.  Her only medical problems are hypertension and hypothyroidism. Richard A. Jacky Kindle, M.D., is her family physician.  CURRENT MEDICATIONS: 1. Levothroid 1 mg q.d. 2. Celebrex 200 mg one q.d. 3. Evista 60 mg q.d. 4. Paxil CR 25 mg q.d. 5. Diovan 160/HCT one q.d. 6. Norvasc 5 mg one q.d.  ALLERGIES:  No known drug allergies, but she is sensitive to MORPHINE and ANESTHETIC AGENTS.  SOCIAL HISTORY:  The patient is married.  She has no intake of tobacco.  She has two glasses or wine per week.  FAMILY HISTORY:  Positive for hypertension.  REVIEW OF SYSTEMS:  CNS:  No seizure disorder, paralysis, numbness, or double vision.  Respiratory:  No productive cough.  No hemoptysis.  No shortness of breath.  Cardiovascular:  No chest pain.  No angina.  No  orthopnea. Gastrointestinal:  No nausea, vomiting, melena, or bloody stools. Genitourinary:  No discharge, dysuria, or hematuria.  Musculoskeletal: Primarily as in the present illness.  PHYSICAL EXAMINATION:  An alert, cooperative, friendly, delightful, 70 year old, white female.  VITAL SIGNS:  Blood pressure 130/76, pulse 72, respirations 12.  HEENT:  Normocephalic.  PERRLA.  EOM intact.  The oropharynx is clear.  CHEST:  Clear to auscultation.  No rhonchi.  No rales.  HEART:  Regular rate and rhythm.  No murmurs are heard.  ABDOMEN:  Soft and nontender.  Liver and spleen not felt.  GENITALIA:  Not done, not pertinent to present illness.  RECTAL:  Not done, not pertinent to present illness.  PELVIC:  Not done, not pertinent to present illness.  BREASTS:  Not done, not pertinent to present illness.  EXTREMITIES:  The patient has pain and crepitants with range of motion of the left knee.  ADMISSION DIAGNOSES: 1. Unicompartmental osteoarthritis of the left knee. 2. Hypertension. 3. Hypothyroidism.  PLAN:  The patient will undergo Repicci unicompartmental knee replacement to the left knee.  Should we have any medical problems in the hospital, we will certainly ask Richard A. Jacky Kindle, M.D., to follow along with [place] during this patients hospitalization.  Dr. Jacky Kindle is to see her today for her general physical examination. Dictated by:   Druscilla Brownie. Shela Nevin, P.A. Attending:  Philips J. Montez Morita, M.D.  DD:  07/23/01 TD:  07/23/01 Job: [id number]

## 2010-11-30 NOTE — Discharge Summary (Signed)
NAMEALBERTINA, Vicki Cervantes                ACCOUNT NO.:  [id number]   MEDICAL RECORD NO.:  [id number]          PATIENT TYPE:  INP   LOCATION:  1621                         FACILITY:  [facility]   PHYSICIAN:  Ollen Gross, M.D.    DATE OF BIRTH:  1940-12-15   DATE OF ADMISSION:  11/04/2007  DATE OF DISCHARGE:  11/08/2007                               DISCHARGE SUMMARY   ADMITTING DIAGNOSES:  1. Osteoarthritis right knee.  2. History of breast cancer.  3. Insomnia.  4. Hypercholesterolemia.  5. Hypertension.  6. Reflux disease.  7. History of esophageal strictures.  8. Postmenopausal.  9. Hypothyroidism.   DISCHARGE DIAGNOSES:  1. Osteoarthritis right knee status post right total knee replacement      arthroplasty.  2. Acute blood loss post-op anemia.  3. Status post transfusion without sequelae.  4. Post-op hypokalemia improved.  5. History of breast cancer.  6. Insomnia.  7. Hypercholesterolemia.  8. Hypertension.  9. Reflux disease.  10.History of esophageal strictures.  11.Postmenopausal.  12.Hypothyroidism.   PROCEDURE:  November 04, 2007 right total knee.  Surgeon Dr. Lequita Halt.  Assistant [employer].  Anesthesia general.   CONSULTS:  None.   BRIEF HISTORY:  Ms. Grzesiak is a 70 year old female with end-stage  arthritis of both knees right is more symptomatic than left.  Had a  previous unicompartmental replacement on the left which has failed.  She  has end-stage on the right and wants to proceed with a right total knee.   LABORATORY DATA:  Pre-op CBC showed hemoglobin 12.4, hematocrit 36.2,  white cell count 13.7, platelets 254,000.  Post-op CBC showed hemoglobin  down to 8.7, white count down to 11.8.  Drifted a little bit further  down to 8.2 was transfused blood post surgery.  Hemoglobin back up to  9.7 with a hematocrit 28.0. PT/PTT pre-op 12.9 and 24 respectively.  INR  1.0.  Serial Pro Time  followed.  PT/INR 22.3 and 1.9.  Chem panel on  admission slightly elevated  creatinine pre-op of 1.22.  Remaining Chem  panel all within normal limits.  Serial BMET followed.  Creatinine came  down to normal level 1.03, potassium dropped post-op likely due to  dilutional component from 4.2-3.1 back to 4.3 post-op.  Have pre-op UA  trace leukocyte esterase, 32 white cells, rare bacteria was negative.  Blood group type A+.   Chest x-ray dated January 05, 2008 no evidence of interval change of acute  process.   EKG dated January 05, 2008 normal sinus rhythm, normal EKG.   HOSPITAL COURSE:  The patient admitted to [facility] tolerated  seizure well, later transferred to the orthopedic floor started on PC  and p.o. analgesic pain control following surgery and improved well on  morning of day one.  She had a low pressure postoperatively felt to be  due to her anemia and narcotics.  She was given fluids.  Held her blood  pressure meds.  She was started back on her Protonix due to her reflux  esophagitis, low hemoglobin so we started on iron.  Potassium was also  low,  probably due to dilution.  We gave her some potassium supplements.  Despite the low pressure,  she was actually doing pretty well by day  two.  She was feeling better.  Pressure was up to 96 systolic up from  the 80s systolic.  Hemoglobin was down to 8.29 though.  Due to  hypotension and also for the low hemoglobin it was felt she would  benefit from undergoing transfusion.  She was given 2 units of blood.  She received blood and also did fairly well, walking about 50 feet on  post-op day #2.  Dressing change incision looked good.  DC the PCA.  Encouraged p.o. meds by day three.  Pressure was a little bit better up  to a systolic about 107 advancing with her therapy, just needed one more  day and by post-op day #4 she was walking over 100 feet, tolerating her  meds, doing well, was discharged home.   DISCHARGE/PLAN:  1. Was discharged home on November 08, 2007.  2. Discharge diagnoses please see  above.  3. Discharge meds:  Percocet, Robaxin, Nu-Iron, Coumadin.   DIET:  Low-sodium heart-healthy diet.   ACTIVITY:  Weightbearing as tolerated right leg total knee protocol.  Home health PT.  Home health nursing.  Follow up 2 weeks.   DISPOSITION:  Home.   CONDITION ON DISCHARGE:  Improved.      Alexzandrew L. Perkins, P.A.C.      Ollen Gross, M.D.  Electronically Signed    ALP/MEDQ  D:  12/16/2007  T:  12/16/2007  Job:  [id number]   cc:   Ollen Gross, M.D.  Fax: [phone]   Wilhemina Bonito. Marina Goodell, MD  [address]  [place]  [place] [address]   Geoffry Paradise, M.D.  Fax: [phone]

## 2010-11-30 NOTE — H&P (Signed)
[facility]  Patient:    Vicki Cervantes, Vicki Cervantes                         MRN: [id number] Adm. Date:  01/29/01 Attending:  Philips J. Montez Morita, M.D. Dictator:   Irena Cords, P.A.-C CC:         Richard A. Jacky Kindle, M.D.   History and Physical  DATE OF BIRTH:  1940/09/24  SOCIAL SECURITY NUMBER:  [phone]  CHIEF COMPLAINT:  Left shoulder pain.  HISTORY OF PRESENT ILLNESS:  Vicki Cervantes is a 70 year old left-hand-dominant female who had the onset of left shoulder pain back in December of 2001.  She had no specific injury, just the onset of pain.  The shoulder has continued to give her discomfort since that time.  She was seen by Dr. Michael Litter. Montez Morita initially and evaluated and felt to have some impingement syndrome.  She was given a subacromial cortisone injection in that shoulder which really gave her little relief.  She has been on Celebrex without much relief in regard to her shoulder.  She was sent for an MRI study of that shoulder which revealed an anterior downsloping acromion and an infraspinatus rupture and large tear of the posterior portion of the supraspinatus; there is also the possibility of a flap tear of the superior labrum.  Because of her failure to improve with conservative measures and her positive MRI, it was recommended she may benefit from left shoulder rotator cuff repair.  REVIEW OF SYSTEMS:  Patient denies headaches, diplopia or blurred vision. Denies any dizziness.  No rhinorrhea.  She did have a cold last month but that has since resolved.  No cough, shortness of breath or chest pain.  No abdominal pain, nausea, vomiting, diarrhea or constipation.  No dysuria or hematuria.  No bright red blood per rectum.  Normal bowel function.  No numbness or tingling in her extremities.  PAST MEDICAL HISTORY:  Significant for thyroid disease and a history of breast cancer with a mastectomy in 1983 on the left side with axillary  node dissection.  She had no radiation or chemotherapy afterwards and has been cancer-free since.  Hypertension.  PAST SURGICAL HISTORY:  Significant for the mastectomy on the left, hysterectomy, left knee arthroscopy in 2001, appendectomy and tonsillectomy.  ALLERGIES:  No known drug allergies.  CURRENT MEDICATIONS: 1. Levothroid 1 mg p.o. q.d. 2. Celebrex 200 mg p.o. q.d. 3. Evista 60 mg p.o. q.d. 4. Paxil 10 mg p.o. q.d. 5. Diovan 160 mg p.o. q.d.  SOCIAL HISTORY:  She is married and has three children who are alive and well. She is retired.  No tobacco use.  Drinks about two alcoholic beverages per week.  PRIMARY CARE PHYSICIAN:  Her primary physician is Dr. Gerlene Burdock A. Jacky Kindle.  PHYSICAL EXAMINATION:  VITALS:  Temperature 98.0, pulse 104, respiratory rate 12, blood pressure 130/94.  GENERAL:  This is a well-developed, well-nourished female in no acute distress.  HEENT:  Head is atraumatic, normocephalic.  Oropharynx is clear without redness, exudate or lesions.  Pupils are equal, round and reactive to light and extraocular movements are grossly intact.  NECK:  Supple without no cervical lymphadenopathy.  LUNGS:  Clear to auscultation bilaterally with no wheezes or crackles.  HEART:  Regular rate and rhythm without no murmurs, rubs, or gallops.  ABDOMEN:  Soft, nontender, nondistended with no masses.  No hepatosplenomegaly.  BREASTS:  Not examined as not pertinent to present illness.  GENITOURINARY:  Not examined as not pertinent to present illness.  EXTREMITIES:  Positive impingement.  Weak external rotation of that shoulder. A little discomfort on adduction over the [place] joint.  SKIN:  Intact without rash or lesions.  PERIPHERAL PULSES:  She has 2+ radial pulses in both upper extremities.  NEUROLOGIC:  Motor and sensory are grossly intact in the radial, ulnar, median and axillary nerve distributions.  RADIOGRAPHIC STUDIES:  Left shoulder type 2  acromion.  MRI, as noted in the HPI, demonstrates downsloping acromion and a large tear of the supraspinatus as well as ruptured infraspinatus.  IMPRESSION: 1. Torn rotator cuff, left shoulder. 2. Hypertension. 3. Thyroid disorder. 4. History of breast cancer with left mastectomy in 1983 and axillary node    dissection.  PLAN:  Patient will be admitted on July 18th for repair of a torn rotator cuff of left shoulder by Dr. Montez Morita.  Preoperative labs to be obtained as well as a signed surgical consent.  I answered all questions for the patient today. DD:  01/26/01 TD:  01/26/01 Job: [id number]

## 2010-11-30 NOTE — Op Note (Signed)
[facility]  Patient:    Vicki Cervantes, Vicki Cervantes Visit Number: [id number] MRN: [id number]          Service Type: SUR Location: 4W 0484 01 Attending Physician:  Rocky Crafts Dictated by:   Michael Litter. Montez Morita, M.D. Proc. Date: 07/30/01 Admit Date:  07/31/2001 Discharge Date: 08/01/2001                             Operative Report  This may have been previously dictated.  If so, discard this one.  SURGEON:  Philips J. Montez Morita, M.D.  ASSISTANT:  Georges Lynch. Darrelyn Hillock, M.D.  PREOPERATIVE DIAGNOSIS:  Medial arthritis, left knee.  POSTOPERATIVE DIAGNOSIS:  Medial arthritis, left knee.  OPERATIVE PROCEDURE:  Repicci hemiarthroplasty using a 32 mm x 6.5 mm tibial polyethylene component and a left medial 48 mm tibia.  DESCRIPTION OF PROCEDURE:  After suitable general anesthesia, she was positioned on the operating table using the arthroscopy leg holder with a tourniquet up above it so that the knee could easily be flexed 130 degrees. The end of the table was bent.  After exsanguination of the leg, an upper thigh tourniquet was inflated to 350 mmHg.  A medial peripatella incision was made.  Subcutaneous tissue was opened, and the capsule was opened in line with the incision and then extended about an inch at the proximal aspect into the vastus medialis for easier exposure.  The medial facet of the patella was freed up with soft dissection, and a small portion of the medial facet was removed with a saw.  The medial capsule was freed up and a partial medial meniscectomy carried out.  Following which, a drill hole was placed in the central femur which had considerable wear all the way down to hard bone, so a good bit of the femur had already worn away, and a second drill hole at a 45 degree angle to that in the proximal tibia.  The distraction clamp was applied, and the leg was externally rotated.  Prior to doing about, about 6 mm of the posterior aspect of the  femur was removed with a saw, and the underside of the femur was rounded with a bur.  After the insertion of the distraction device, the tibia was then reamed to accept the medium-sized 32 mm prosthesis, and it was reamed until the trial could fit and lay flat, and a nice peripheral rim was available all around. The reaming part of the meniscus was removed with the Bovie as well. Following this, the distraction device was removed and since the femur had worn down considerably, a great deal was not taken from the distal femur, but a bit more rounding was done.  A slot was cut for the fins, a hole drilled for the central portion of the prosthesis, and the femoral component was inserted followed by the tibia.  A trial reduction revealed excellent stability.  No lift-off inflection and full hyperextension.  At this point, the remaining osteophytes on the upper femur were removed to make sure that the stability was good enough, and we did not have to increase the diameter of the 6.5 mm tibial component, and it was felt to be quite stable.  The cement was then mixed, and 1 g of vancomycin was added into it, and the tibia was cemented in first.  Excess cement removed, the femur second, and then the knee was brought into full extension, which squeezed out more cement  underneath the tibial component.  It was held for about 30 seconds and then let go, and then the excess cement was peeled off.  After it had set, there was a small amount of cement posteromedial which was removed.  There was some on the posterolateral which seemed to be out of contact with anything through full motion, and it was felt best left in.  The wound was irrigated.  Routine wound closure with some PDS capsule and 2-0 Vicryl in the subcu and a running Monocryl, Steri-Strips on the skin.  Tourniquet was up right at two hours, let down prior to closure with the Monocryl.  Wound was closed over a Hemovac.  Then 30 cc of 0.25%  Marcaine with adrenalin were injected throughout the tissue. The posterior capsule was injected prior to the cementing so we could get to that area.  She went to recovery in good condition. Dictated by:   [employer]. Montez Morita, M.D. Attending Physician:  Rocky Crafts DD:  08/04/01 TD:  08/05/01 Job: [id number]

## 2010-11-30 NOTE — Assessment & Plan Note (Signed)
[facility]                         GASTROENTEROLOGY OFFICE NOTE   [name], [name]                       MRN:          [id number]  DATE:10/21/2006                            DOB:          Aug 28, 1940    HISTORY:  Vicki Cervantes presents today for followup.  She is a 70 year old  with hypertension, hyperlipidemia, hypothyroidism, osteoarthritis,  remote breast cancer, and colon polyps who was evaluated September 03, 2006 for dysphagia.  See that dictation for details.  She subsequently  underwent upper endoscopy September 04, 2006.  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  The esophagus was dilated with a 54-French Maloney dilator.  She was  also noticed to have a proximal submucosal nodule for which she is  undergoing endoscopic ultrasound, for further characterization, later  this week.  Post procedure she was placed on daily proton pump inhibitor  therapy.  Currently, she is on Protonix 40 mg daily.  Post procedure she  has done well with no recurrent dysphagia, no reflux symptoms.  We  reviewed her endoscopic findings in detail.   CURRENT MEDICATIONS:  1. Celebrex.  2. Levothroid.  3. Evista.  4. Diovan.  5. Lipitor.  6. Protonix.   PHYSICAL EXAMINATION:  A well-appearing female in no acute distress.  Blood pressure 118/68, heart rate is 80, weight is 139.6 pounds.  Her abdomen was not reexamined.   IMPRESSION:  1. Gastroesophageal reflux disease complicated by peptic stricture,      status post upper endoscopy with esophageal dilation.  Currently      asymptomatic post dilation, on Protonix.  2. Incidental submucosal lesion of the proximal stomach, likely benign      based on size.  For endoscopic ultrasonographic characterization      later this week.  3. History of colon polyps.  Due for followup in September of 2008.  4. Incidental diverticulosis.   RECOMMENDATIONS:  1.  Continue daily proton pump inhibitor therapy.  2. Keep endoscopic ultrasound appointment this week.  3. Contact this office for any recurrent problems with dysphagia.  4. Plan on surveillance colonoscopy later this year.  5. Ongoing general medical care with Dr. Jacky Kindle.     Vicki Cervantes. Vicki Goodell, MD  Electronically Signed    JNP/MedQ  DD: 10/21/2006  DT: 10/21/2006  Job #: [id number]   cc:   Geoffry Paradise, M.D.

## 2011-04-03 ENCOUNTER — Encounter: Payer: Self-pay | Admitting: Cardiovascular Disease

## 2011-04-03 ENCOUNTER — Ambulatory Visit (INDEPENDENT_AMBULATORY_CARE_PROVIDER_SITE_OTHER): Payer: Medicare Other | Admitting: Cardiovascular Disease

## 2011-04-03 VITALS — BP 140/86 | HR 86 | Ht 63.0 in | Wt 137.6 lb

## 2011-04-03 DIAGNOSIS — E785 Hyperlipidemia, unspecified: Secondary | ICD-10-CM | POA: Insufficient documentation

## 2011-04-03 NOTE — Progress Notes (Signed)
[employer] Date of Birth  December 14, 1940 [facility] [address]    [address] [place], [place]  [address] [phone]  Fax  [phone]  History of Present Illness:  70 year old female with a history of hypertension and hyperlipidemia. She's here for further evaluation regarding her hyperlipidemia.  She typically walks quite a bit.  She's been a little and with some foot surgery.  She's been active all of her life.  Only recently has she had cut back her exercise because of her orthopedic knee problems.  She generally eats a healthy diet but admits that she eats out a lot.  She may be getting a little bit of extra salt when she eats out at restaurants.  Current Outpatient Prescriptions  Medication Sig Dispense Refill  . atorvastatin (LIPITOR) 20 MG tablet Take 20 mg by mouth daily.        . celecoxib (CELEBREX) 200 MG capsule Take 200 mg by mouth daily.        . citalopram (CELEXA) 20 MG tablet Take 20 mg by mouth daily.        Marland Kitchen levothyroxine (SYNTHROID, LEVOTHROID) 100 MCG tablet Take 100 mcg by mouth daily.        . pantoprazole (PROTONIX) 40 MG tablet Take 40 mg by mouth daily.        . raloxifene (EVISTA) 60 MG tablet Take 60 mg by mouth daily.        . valsartan-hydrochlorothiazide (DIOVAN-HCT) 160-12.5 MG per tablet Take 1 tablet by mouth 2 (two) times daily.           No Known Allergies  Past Medical History  Diagnosis Date  . Hypertension   . High triglycerides   . Hypercholesteremia     Past Surgical History  Procedure Date  . Ovary surgery 1972-1973  . Mastectomy 1982  . Breast surgery 1982  . Rotator cuff repair   . Total knee arthroplasty 2009-2010    x2  . Foot surgery 2009-2010    x2. Surgery in 2011    History  Smoking status  . Never Smoker   Smokeless tobacco  . Not on file    History  Alcohol Use No    Wine Occas.    History reviewed. No pertinent family history.  Reviw of Systems:  Reviewed in the HPI.  All other systems are  negative.  Physical Exam: BP 140/86  Pulse 86  Ht 5\' 3"  (1.6 m)  Wt 137 lb 9.6 oz (62.415 kg)  BMI 24.37 kg/m2 The patient is alert and oriented x 3.  The mood and affect are normal.   Skin: warm and dry.  Color is normal.    HEENT:   the sclera are nonicteric.  The mucous membranes are moist.  The carotids are 2+ without bruits.  There is no thyromegaly.  There is no JVD.    Lungs: clear.  The chest wall is non tender.    Heart: regular rate with a normal S1 and S2.  There are no murmurs, gallops, or rubs. The PMI is not displaced.     Abdomen: good bowel sounds.  There is no guarding or rebound.  There is no hepatosplenomegaly or tenderness.  There are no masses.   Extremities:  no clubbing, cyanosis, or edema.  The legs are without rashes.  The distal pulses are intact.   Neuro:  Cranial nerves II - XII are intact.  Motor and sensory functions are intact.    The gait is  normal.  ECG: At Dr. Lanell Matar office reveals normal sinus rhythm. She has no ST or T wave changes.  Assessment / Plan:

## 2011-04-03 NOTE — Assessment & Plan Note (Signed)
Did not have her lipid levels. We've asked Dr. Lanell Matar office to fax over.  She stated that her main issue was with high triglyceride levels. We discussed the fact that some degree of this may be due to a family history of high triglyceride levels.  We also discussed the fact that she may be getting a little bit more fat and carbohydrates if she is out at restaurants a regular basis. I've asked her to cut her fat intake and to cut her intake of breath and other starchy foods. I've also asked her to exercise on a regular basis. She clearly is limited by her orthopedic problems but encouraged her to work out on the stationary bike or the elliptical machine which should be easier for her.  We will check her lipids again in 6 months. If her triglyceride levels remain elevated I would start her on Trilipix 135 mg a day or Tricor 145 mg a day.

## 2011-04-09 LAB — BASIC METABOLIC PANEL
BUN: 7
BUN: 8
CO2: 28
CO2: 29
Calcium: 7.7 — ABNORMAL LOW
Chloride: 105
Chloride: 108
Chloride: 99
Creatinine, Ser: 1.03
GFR calc Af Amer: 54 — ABNORMAL LOW
Glucose, Bld: 135 — ABNORMAL HIGH
Glucose, Bld: 149 — ABNORMAL HIGH
Glucose, Bld: 149 — ABNORMAL HIGH
Potassium: 3.1 — ABNORMAL LOW
Potassium: 4.3
Sodium: 135
Sodium: 141

## 2011-04-09 LAB — URINALYSIS, ROUTINE W REFLEX MICROSCOPIC
Bilirubin Urine: NEGATIVE
Glucose, UA: NEGATIVE
Hgb urine dipstick: NEGATIVE
Specific Gravity, Urine: 1.016
Urobilinogen, UA: 0.2
pH: 6.5

## 2011-04-09 LAB — CBC
HCT: 23.4 — ABNORMAL LOW
HCT: 24.9 — ABNORMAL LOW
HCT: 28 — ABNORMAL LOW
HCT: 36.2
Hemoglobin: 12.4
Hemoglobin: 8.2 — ABNORMAL LOW
Hemoglobin: 8.7 — ABNORMAL LOW
MCHC: 34.7
MCHC: 35
MCV: 89.2
MCV: 89.6
MCV: 89.9
Platelets: 139 — ABNORMAL LOW
Platelets: 163
Platelets: 254
RBC: 2.61 — ABNORMAL LOW
RDW: 12.8
RDW: 13.4
WBC: 10.5
WBC: 11.8 — ABNORMAL HIGH
WBC: 13.7 — ABNORMAL HIGH

## 2011-04-09 LAB — PROTIME-INR
INR: 1.9 — ABNORMAL HIGH
Prothrombin Time: 23.4 — ABNORMAL HIGH

## 2011-04-09 LAB — TYPE AND SCREEN

## 2011-04-09 LAB — COMPREHENSIVE METABOLIC PANEL
ALT: 21
Albumin: 3.8
Alkaline Phosphatase: 67
Chloride: 102
Potassium: 4.2
Sodium: 140
Total Bilirubin: 0.7
Total Protein: 6.8

## 2011-04-09 LAB — URINE MICROSCOPIC-ADD ON

## 2011-04-09 LAB — ABO/RH: ABO/RH(D): A POS

## 2011-05-14 ENCOUNTER — Telehealth (HOSPITAL_BASED_OUTPATIENT_CLINIC_OR_DEPARTMENT_OTHER): Payer: Self-pay | Admitting: Emergency Medicine

## 2011-06-12 ENCOUNTER — Other Ambulatory Visit: Payer: Self-pay | Admitting: Dermatology

## 2011-10-03 ENCOUNTER — Telehealth: Payer: Self-pay

## 2011-10-03 NOTE — Telephone Encounter (Signed)
Pt needs a recall EUS for gastric mass radial linear WL Left message on machine to call back

## 2011-10-04 NOTE — Telephone Encounter (Signed)
Left message on machine to call back letter mailed. 

## 2012-03-03 ENCOUNTER — Encounter (HOSPITAL_COMMUNITY): Payer: Self-pay | Admitting: Pharmacy Technician

## 2012-03-03 ENCOUNTER — Other Ambulatory Visit: Payer: Self-pay | Admitting: Orthopedic Surgery

## 2012-03-03 ENCOUNTER — Encounter (HOSPITAL_COMMUNITY): Payer: Self-pay | Admitting: *Deleted

## 2012-03-04 ENCOUNTER — Inpatient Hospital Stay (HOSPITAL_COMMUNITY): Payer: Medicare Other

## 2012-03-04 ENCOUNTER — Inpatient Hospital Stay (HOSPITAL_COMMUNITY)
Admission: RE | Admit: 2012-03-04 | Discharge: 2012-03-09 | DRG: 491 | Disposition: A | Discharge status: Home / Self Care | Payer: Medicare Other | Source: Ambulatory Visit | Attending: Orthopedic Surgery | Admitting: Orthopedic Surgery

## 2012-03-04 ENCOUNTER — Encounter (HOSPITAL_COMMUNITY): Payer: Self-pay | Admitting: Certified Registered Nurse Anesthetist

## 2012-03-04 ENCOUNTER — Inpatient Hospital Stay (HOSPITAL_COMMUNITY): Payer: Medicare Other | Admitting: Certified Registered Nurse Anesthetist

## 2012-03-04 ENCOUNTER — Encounter (HOSPITAL_COMMUNITY)
Admission: RE | Disposition: A | Discharge status: Home / Self Care | Payer: Self-pay | Source: Ambulatory Visit | Attending: Orthopedic Surgery

## 2012-03-04 ENCOUNTER — Encounter (HOSPITAL_COMMUNITY): Payer: Self-pay | Admitting: *Deleted

## 2012-03-04 DIAGNOSIS — M48061 Spinal stenosis, lumbar region without neurogenic claudication: Principal | ICD-10-CM | POA: Diagnosis present

## 2012-03-04 DIAGNOSIS — M5126 Other intervertebral disc displacement, lumbar region: Secondary | ICD-10-CM | POA: Diagnosis present

## 2012-03-04 DIAGNOSIS — Z79899 Other long term (current) drug therapy: Secondary | ICD-10-CM

## 2012-03-04 DIAGNOSIS — E7800 Pure hypercholesterolemia, unspecified: Secondary | ICD-10-CM | POA: Diagnosis present

## 2012-03-04 DIAGNOSIS — I10 Essential (primary) hypertension: Secondary | ICD-10-CM | POA: Diagnosis present

## 2012-03-04 DIAGNOSIS — Z9889 Other specified postprocedural states: Secondary | ICD-10-CM

## 2012-03-04 DIAGNOSIS — K219 Gastro-esophageal reflux disease without esophagitis: Secondary | ICD-10-CM | POA: Diagnosis present

## 2012-03-04 DIAGNOSIS — E781 Pure hyperglyceridemia: Secondary | ICD-10-CM | POA: Diagnosis present

## 2012-03-04 DIAGNOSIS — Z96659 Presence of unspecified artificial knee joint: Secondary | ICD-10-CM

## 2012-03-04 HISTORY — DX: Gastro-esophageal reflux disease without esophagitis: K21.9

## 2012-03-04 HISTORY — DX: Other specified postprocedural states: R11.2

## 2012-03-04 HISTORY — DX: Unspecified osteoarthritis, unspecified site: M19.90

## 2012-03-04 HISTORY — DX: Adverse effect of unspecified anesthetic, initial encounter: T41.45XA

## 2012-03-04 HISTORY — DX: Other complications of anesthesia, initial encounter: T88.59XA

## 2012-03-04 HISTORY — DX: Other specified postprocedural states: Z98.890

## 2012-03-04 LAB — SURGICAL PCR SCREEN: Staphylococcus aureus: POSITIVE — AB

## 2012-03-04 SURGERY — DECOMPRESSIVE LUMBAR LAMINECTOMY LEVEL 2
Anesthesia: General | Site: Back | Wound class: Clean

## 2012-03-04 MED ORDER — THROMBIN 5000 UNITS EX SOLR
CUTANEOUS | Status: DC | PRN
  Administered 2012-03-04: 5000 [IU] via TOPICAL

## 2012-03-04 MED ORDER — DEXAMETHASONE SODIUM PHOSPHATE 10 MG/ML IJ SOLN
INTRAMUSCULAR | Status: DC | PRN
  Administered 2012-03-04: 10 mg via INTRAVENOUS

## 2012-03-04 MED ORDER — LIDOCAINE HCL (CARDIAC) 20 MG/ML IV SOLN
INTRAVENOUS | Status: DC | PRN
  Administered 2012-03-04: 70 mg via INTRAVENOUS

## 2012-03-04 MED ORDER — DEXTROSE IN LACTATED RINGERS 5 % IV SOLN
INTRAVENOUS | Status: DC
  Administered 2012-03-04: 23:00:00 via INTRAVENOUS
  Administered 2012-03-05: 20 mL via INTRAVENOUS
  Administered 2012-03-06: 22:00:00 via INTRAVENOUS

## 2012-03-04 MED ORDER — ADULT MULTIVITAMIN W/MINERALS CH
1.0000 | ORAL_TABLET | Freq: Every day | ORAL | Status: DC
  Administered 2012-03-05 – 2012-03-09 (×5): 1 via ORAL
  Filled 2012-03-04 (×5): qty 1

## 2012-03-04 MED ORDER — LACTATED RINGERS IV SOLN
INTRAVENOUS | Status: DC | PRN
  Administered 2012-03-04 (×2): via INTRAVENOUS

## 2012-03-04 MED ORDER — DIPHENHYDRAMINE HCL 50 MG/ML IJ SOLN: 12.5000 mg | Freq: Four times a day (QID) | INTRAMUSCULAR | Status: DC | PRN

## 2012-03-04 MED ORDER — PHENOL 1.4 % MT LIQD: 1.0000 | OROMUCOSAL | Status: DC | PRN

## 2012-03-04 MED ORDER — SODIUM CHLORIDE 0.9 % IV SOLN: 250.0000 mL | INTRAVENOUS | Status: DC

## 2012-03-04 MED ORDER — PANTOPRAZOLE SODIUM 40 MG PO TBEC
40.0000 mg | DELAYED_RELEASE_TABLET | Freq: Every day | ORAL | Status: DC
  Administered 2012-03-05 – 2012-03-09 (×5): 40 mg via ORAL
  Filled 2012-03-04 (×5): qty 1

## 2012-03-04 MED ORDER — ONDANSETRON HCL 4 MG/2ML IJ SOLN
INTRAMUSCULAR | Status: DC | PRN
  Administered 2012-03-04 (×2): 2 mg via INTRAVENOUS

## 2012-03-04 MED ORDER — IRBESARTAN 150 MG PO TABS
150.0000 mg | ORAL_TABLET | Freq: Two times a day (BID) | ORAL | Status: DC
  Administered 2012-03-04 – 2012-03-09 (×10): 150 mg via ORAL
  Filled 2012-03-04 (×11): qty 1

## 2012-03-04 MED ORDER — HYDROCHLOROTHIAZIDE 12.5 MG PO CAPS
12.5000 mg | ORAL_CAPSULE | Freq: Two times a day (BID) | ORAL | Status: DC
  Administered 2012-03-04 – 2012-03-09 (×9): 12.5 mg via ORAL
  Filled 2012-03-04 (×12): qty 1

## 2012-03-04 MED ORDER — NALOXONE HCL 0.4 MG/ML IJ SOLN: 0.4000 mg | INTRAMUSCULAR | Status: DC | PRN

## 2012-03-04 MED ORDER — RALOXIFENE HCL 60 MG PO TABS
60.0000 mg | ORAL_TABLET | Freq: Every day | ORAL | Status: DC
  Administered 2012-03-04 – 2012-03-09 (×6): 60 mg via ORAL
  Filled 2012-03-04 (×6): qty 1

## 2012-03-04 MED ORDER — SODIUM CHLORIDE 0.9 % IJ SOLN: 9.0000 mL | INTRAMUSCULAR | Status: DC | PRN

## 2012-03-04 MED ORDER — HYDROMORPHONE 0.3 MG/ML IV SOLN
INTRAVENOUS | Status: DC
  Administered 2012-03-04: 19:00:00 via INTRAVENOUS
  Administered 2012-03-04: 2.1 mg via INTRAVENOUS
  Administered 2012-03-05: 2.06 mg via INTRAVENOUS
  Administered 2012-03-05: 0.3 mg via INTRAVENOUS
  Administered 2012-03-05: 0.6 mg via INTRAVENOUS
  Administered 2012-03-05: 2.1 mg via INTRAVENOUS
  Administered 2012-03-05 (×2): 1.5 mg via INTRAVENOUS
  Administered 2012-03-06: 1.2 mg via INTRAVENOUS
  Administered 2012-03-06: 1.8 mg via INTRAVENOUS
  Administered 2012-03-06: 06:00:00 via INTRAVENOUS
  Filled 2012-03-04 (×2): qty 25

## 2012-03-04 MED ORDER — ACETAMINOPHEN 10 MG/ML IV SOLN
INTRAVENOUS | Status: DC | PRN
  Administered 2012-03-04: 1000 mg via INTRAVENOUS

## 2012-03-04 MED ORDER — ONDANSETRON HCL 4 MG/2ML IJ SOLN
4.0000 mg | INTRAMUSCULAR | Status: DC | PRN
  Administered 2012-03-06 – 2012-03-07 (×4): 4 mg via INTRAVENOUS
  Filled 2012-03-04 (×4): qty 2

## 2012-03-04 MED ORDER — SUCCINYLCHOLINE CHLORIDE 20 MG/ML IJ SOLN
INTRAMUSCULAR | Status: DC | PRN
  Administered 2012-03-04: 100 mg via INTRAVENOUS

## 2012-03-04 MED ORDER — CEFAZOLIN SODIUM-DEXTROSE 2-3 GM-% IV SOLR
2.0000 g | Freq: Three times a day (TID) | INTRAVENOUS | Status: AC
  Administered 2012-03-04 – 2012-03-05 (×2): 2 g via INTRAVENOUS
  Filled 2012-03-04 (×2): qty 50

## 2012-03-04 MED ORDER — NEOSTIGMINE METHYLSULFATE 1 MG/ML IJ SOLN
INTRAMUSCULAR | Status: DC | PRN
  Administered 2012-03-04: 4 mg via INTRAVENOUS

## 2012-03-04 MED ORDER — KETOROLAC TROMETHAMINE 30 MG/ML IJ SOLN: 15.0000 mg | Freq: Once | INTRAMUSCULAR | Status: DC | PRN

## 2012-03-04 MED ORDER — VALSARTAN-HYDROCHLOROTHIAZIDE 160-12.5 MG PO TABS: 1.0000 | ORAL_TABLET | Freq: Two times a day (BID) | ORAL | Status: DC

## 2012-03-04 MED ORDER — MENTHOL 3 MG MT LOZG: 1.0000 | LOZENGE | OROMUCOSAL | Status: DC | PRN

## 2012-03-04 MED ORDER — LEVOTHYROXINE SODIUM 100 MCG PO TABS
100.0000 ug | ORAL_TABLET | Freq: Every day | ORAL | Status: DC
  Administered 2012-03-05 – 2012-03-09 (×5): 100 ug via ORAL
  Filled 2012-03-04 (×7): qty 1

## 2012-03-04 MED ORDER — ATORVASTATIN CALCIUM 20 MG PO TABS
20.0000 mg | ORAL_TABLET | Freq: Every evening | ORAL | Status: DC
  Administered 2012-03-04 – 2012-03-08 (×5): 20 mg via ORAL
  Filled 2012-03-04 (×6): qty 1

## 2012-03-04 MED ORDER — PROPOFOL 10 MG/ML IV EMUL
INTRAVENOUS | Status: DC | PRN
  Administered 2012-03-04: 200 mg via INTRAVENOUS

## 2012-03-04 MED ORDER — SODIUM CHLORIDE 0.9 % IJ SOLN: 3.0000 mL | INTRAMUSCULAR | Status: DC | PRN

## 2012-03-04 MED ORDER — MUPIROCIN 2 % EX OINT
TOPICAL_OINTMENT | Freq: Two times a day (BID) | CUTANEOUS | Status: DC
  Administered 2012-03-04: 1 via NASAL
  Filled 2012-03-04: qty 22

## 2012-03-04 MED ORDER — METHOCARBAMOL 500 MG PO TABS
500.0000 mg | ORAL_TABLET | Freq: Four times a day (QID) | ORAL | Status: DC | PRN
  Administered 2012-03-05 – 2012-03-09 (×12): 500 mg via ORAL
  Filled 2012-03-04 (×12): qty 1

## 2012-03-04 MED ORDER — ROCURONIUM BROMIDE 100 MG/10ML IV SOLN
INTRAVENOUS | Status: DC | PRN
  Administered 2012-03-04 (×2): 5 mg via INTRAVENOUS
  Administered 2012-03-04: 35 mg via INTRAVENOUS
  Administered 2012-03-04 (×3): 5 mg via INTRAVENOUS

## 2012-03-04 MED ORDER — METHOCARBAMOL 100 MG/ML IJ SOLN
500.0000 mg | Freq: Four times a day (QID) | INTRAVENOUS | Status: DC | PRN
  Administered 2012-03-04: 500 mg via INTRAVENOUS
  Filled 2012-03-04: qty 5

## 2012-03-04 MED ORDER — SODIUM CHLORIDE 0.9 % IJ SOLN: 3.0000 mL | Freq: Two times a day (BID) | INTRAMUSCULAR | Status: DC

## 2012-03-04 MED ORDER — CEFAZOLIN SODIUM-DEXTROSE 2-3 GM-% IV SOLR
2.0000 g | INTRAVENOUS | Status: AC
  Administered 2012-03-04: 2 g via INTRAVENOUS

## 2012-03-04 MED ORDER — CITALOPRAM HYDROBROMIDE 20 MG PO TABS
20.0000 mg | ORAL_TABLET | Freq: Every day | ORAL | Status: DC
  Administered 2012-03-04 – 2012-03-08 (×5): 20 mg via ORAL
  Filled 2012-03-04 (×6): qty 1

## 2012-03-04 MED ORDER — DIPHENHYDRAMINE HCL 12.5 MG/5ML PO ELIX: 12.5000 mg | ORAL_SOLUTION | Freq: Four times a day (QID) | ORAL | Status: DC | PRN

## 2012-03-04 MED ORDER — GLYCOPYRROLATE 0.2 MG/ML IJ SOLN
INTRAMUSCULAR | Status: DC | PRN
  Administered 2012-03-04: 0.6 mg via INTRAVENOUS

## 2012-03-04 MED ORDER — FENTANYL CITRATE 0.05 MG/ML IJ SOLN
INTRAMUSCULAR | Status: DC | PRN
  Administered 2012-03-04: 25 ug via INTRAVENOUS
  Administered 2012-03-04 (×3): 50 ug via INTRAVENOUS
  Administered 2012-03-04: 100 ug via INTRAVENOUS
  Administered 2012-03-04: 50 ug via INTRAVENOUS
  Administered 2012-03-04: 25 ug via INTRAVENOUS

## 2012-03-04 MED ORDER — PROMETHAZINE HCL 25 MG/ML IJ SOLN: 6.2500 mg | INTRAMUSCULAR | Status: DC | PRN

## 2012-03-04 MED ORDER — HYDROMORPHONE HCL PF 1 MG/ML IJ SOLN: 0.2500 mg | INTRAMUSCULAR | Status: DC | PRN

## 2012-03-04 MED ORDER — POVIDONE-IODINE 7.5 % EX SOLN: Freq: Once | CUTANEOUS | Status: DC

## 2012-03-04 MED ORDER — ONDANSETRON HCL 4 MG/2ML IJ SOLN: 4.0000 mg | Freq: Four times a day (QID) | INTRAMUSCULAR | Status: DC | PRN

## 2012-03-04 SURGICAL SUPPLY — 43 items
APL SKNCLS STERI-STRIP NONHPOA (GAUZE/BANDAGES/DRESSINGS) ×1
BAG SPEC THK2 15X12 ZIP CLS (MISCELLANEOUS) ×1
BAG ZIPLOCK 12X15 (MISCELLANEOUS) ×2 IMPLANT
BENZOIN TINCTURE PRP APPL 2/3 (GAUZE/BANDAGES/DRESSINGS) ×2 IMPLANT
BUR EGG ELITE 4.0 (BURR) ×2 IMPLANT
CLEANER TIP ELECTROSURG 2X2 (MISCELLANEOUS) ×2 IMPLANT
CLOTH BEACON ORANGE TIMEOUT ST (SAFETY) ×2 IMPLANT
CONT SPEC 4OZ CLIKSEAL STRL BL (MISCELLANEOUS) ×2 IMPLANT
DRAIN PENROSE 18X1/4 LTX STRL (WOUND CARE) IMPLANT
DRAPE MICROSCOPE LEICA (MISCELLANEOUS) ×2 IMPLANT
DRAPE POUCH INSTRU U-SHP 10X18 (DRAPES) ×2 IMPLANT
DRAPE SURG 17X11 SM STRL (DRAPES) ×2 IMPLANT
DRSG ADAPTIC 3X8 NADH LF (GAUZE/BANDAGES/DRESSINGS) ×2 IMPLANT
DRSG PAD ABDOMINAL 8X10 ST (GAUZE/BANDAGES/DRESSINGS) ×2 IMPLANT
DURAPREP 26ML APPLICATOR (WOUND CARE) ×2 IMPLANT
ELECT BLADE TIP CTD 4 INCH (ELECTRODE) ×2 IMPLANT
ELECT REM PT RETURN 9FT ADLT (ELECTROSURGICAL) ×2
ELECTRODE REM PT RTRN 9FT ADLT (ELECTROSURGICAL) ×1 IMPLANT
GLOVE BIO SURGEON STRL SZ8 (GLOVE) ×4 IMPLANT
GLOVE ECLIPSE 8.0 STRL XLNG CF (GLOVE) ×2 IMPLANT
GLOVE INDICATOR 8.0 STRL GRN (GLOVE) ×4 IMPLANT
GOWN STRL REIN XL XLG (GOWN DISPOSABLE) ×2 IMPLANT
KIT BASIN OR (CUSTOM PROCEDURE TRAY) ×2 IMPLANT
KIT POSITIONING SURG ANDREWS (MISCELLANEOUS) ×2 IMPLANT
MANIFOLD NEPTUNE II (INSTRUMENTS) ×2 IMPLANT
NEEDLE SPNL 18GX3.5 QUINCKE PK (NEEDLE) ×6 IMPLANT
NS IRRIG 1000ML POUR BTL (IV SOLUTION) ×2 IMPLANT
PATTIES SURGICAL .5 X.5 (GAUZE/BANDAGES/DRESSINGS) ×4 IMPLANT
PATTIES SURGICAL .75X.75 (GAUZE/BANDAGES/DRESSINGS) ×2 IMPLANT
PATTIES SURGICAL 1X1 (DISPOSABLE) IMPLANT
POSITIONER SURGICAL ARM (MISCELLANEOUS) ×2 IMPLANT
SPONGE GAUZE 4X4 12PLY (GAUZE/BANDAGES/DRESSINGS) ×2 IMPLANT
SPONGE LAP 4X18 X RAY DECT (DISPOSABLE) ×4 IMPLANT
SPONGE SURGIFOAM ABS GEL 100 (HEMOSTASIS) ×2 IMPLANT
STAPLER VISISTAT 35W (STAPLE) IMPLANT
SUT VIC AB 1 CT1 27 (SUTURE) ×4
SUT VIC AB 1 CT1 27XBRD ANTBC (SUTURE) ×2 IMPLANT
SUT VIC AB 2-0 CT1 27 (SUTURE) ×4
SUT VIC AB 2-0 CT1 27XBRD (SUTURE) ×2 IMPLANT
TAPE CLOTH SURG 4X10 WHT LF (GAUZE/BANDAGES/DRESSINGS) ×2 IMPLANT
TOWEL OR 17X26 10 PK STRL BLUE (TOWEL DISPOSABLE) ×4 IMPLANT
TRAY LAMINECTOMY (CUSTOM PROCEDURE TRAY) ×2 IMPLANT
WATER STERILE IRR 1500ML POUR (IV SOLUTION) ×2 IMPLANT

## 2012-03-04 NOTE — Transfer of Care (Signed)
Immediate Anesthesia Transfer of Care Note  Patient: Vicki Cervantes  Procedure(s) Performed: Procedure(s) (LRB): DECOMPRESSIVE LUMBAR LAMINECTOMY LEVEL 2 (N/A)  Patient Location: PACU  Anesthesia Type: General  Level of Consciousness: awake, alert  and oriented  Airway & Oxygen Therapy: Patient Spontanous Breathing and Patient connected to face mask oxygen  Post-op Assessment: Report given to PACU RN and Post -op Vital signs reviewed and stable  Post vital signs: Reviewed and stable  Complications: No apparent anesthesia complications

## 2012-03-04 NOTE — Anesthesia Preprocedure Evaluation (Signed)
Anesthesia Evaluation  Patient identified by MRN, date of birth, ID band Patient awake    Reviewed: Allergy & Precautions, H&P , NPO status , Patient's Chart, lab work & pertinent test results  Airway Mallampati: II TM Distance: <3 FB Neck ROM: Full    Dental No notable dental hx.    Pulmonary neg pulmonary ROS,  breath sounds clear to auscultation  Pulmonary exam normal       Cardiovascular hypertension, Pt. on medications Rhythm:Regular Rate:Normal     Neuro/Psych negative neurological ROS  negative psych ROS   GI/Hepatic Neg liver ROS, GERD-  Medicated,  Endo/Other  negative endocrine ROS  Renal/GU negative Renal ROS  negative genitourinary   Musculoskeletal negative musculoskeletal ROS (+)   Abdominal   Peds negative pediatric ROS (+)  Hematology negative hematology ROS (+)   Anesthesia Other Findings   Reproductive/Obstetrics negative OB ROS                           Anesthesia Physical Anesthesia Plan  ASA: II  Anesthesia Plan: General   Post-op Pain Management:    Induction: Intravenous  Airway Management Planned: Oral ETT  Additional Equipment:   Intra-op Plan:   Post-operative Plan: Extubation in OR  Informed Consent: I have reviewed the patients History and Physical, chart, labs and discussed the procedure including the risks, benefits and alternatives for the proposed anesthesia with the patient or authorized representative who has indicated his/her understanding and acceptance.   Dental advisory given  Plan Discussed with: CRNA and Surgeon  Anesthesia Plan Comments:         Anesthesia Quick Evaluation  

## 2012-03-04 NOTE — Preoperative (Signed)
Beta Blockers   Reason not to administer Beta Blockers:Not Applicable 

## 2012-03-04 NOTE — Anesthesia Postprocedure Evaluation (Signed)
  Anesthesia Post-op Note  Patient: Vicki Cervantes  Procedure(s) Performed: Procedure(s) (LRB): DECOMPRESSIVE LUMBAR LAMINECTOMY LEVEL 2 (N/A)  Patient Location: PACU  Anesthesia Type: General  Level of Consciousness: awake and alert   Airway and Oxygen Therapy: Patient Spontanous Breathing  Post-op Pain: mild  Post-op Assessment: Post-op Vital signs reviewed, Patient's Cardiovascular Status Stable, Respiratory Function Stable, Patent Airway and No signs of Nausea or vomiting  Post-op Vital Signs: stable  Complications: No apparent anesthesia complications

## 2012-03-04 NOTE — H&P (Signed)
Vicki Cervantes is an 71 y.o. female.   Chief : low back and rt leg pain HPI: abrupt onset of above with MRI demonstrating extruded disc fragment above L5-S1 with spinal stenosis at  L4-5  Past Medical History  Diagnosis Date  . Hypertension   . High triglycerides   . Hypercholesteremia   . Complication of anesthesia   . PONV (postoperative nausea and vomiting)   . Arthritis   . GERD (gastroesophageal reflux disease)   . Cancer     breast cancer  . Spinal stenosis     Past Surgical History  Procedure Date  . Ovary surgery 1972-1973  . Mastectomy 1982  . Breast surgery 1982  . Rotator cuff repair   . Total knee arthroplasty 2009-2010    x2  . Foot surgery 2009-2010    x2. Surgery in 2011    History reviewed. No pertinent family history. Social History:  reports that she has never smoked. She does not have any smokeless tobacco history on file. She reports that she drinks alcohol. She reports that she does not use illicit drugs.  Allergies: No Known Allergies  Medications Prior to Admission  Medication Sig Dispense Refill  . atorvastatin (LIPITOR) 20 MG tablet Take 20 mg by mouth every evening.       . citalopram (CELEXA) 20 MG tablet Take 20 mg by mouth at bedtime.       Marland Kitchen levothyroxine (SYNTHROID, LEVOTHROID) 100 MCG tablet Take 100 mcg by mouth every morning.       . pantoprazole (PROTONIX) 40 MG tablet Take 40 mg by mouth every morning.       . raloxifene (EVISTA) 60 MG tablet Take 60 mg by mouth daily.        . valsartan-hydrochlorothiazide (DIOVAN-HCT) 160-12.5 MG per tablet Take 1 tablet by mouth 2 (two) times daily.        . celecoxib (CELEBREX) 200 MG capsule Take 200 mg by mouth daily at 12 noon.       . Multiple Vitamin (MULTIVITAMIN WITH MINERALS) TABS Take 1 tablet by mouth daily.        No results found for this or any previous visit (from the past 48 hour(s)). No results found.  ROS  Blood pressure 153/83, pulse 78, temperature 98.5 F (36.9 C),  temperature source Oral, resp. rate 18, height 5\' 1"  (1.549 m), weight 61.236 kg (135 lb), SpO2 97.00%. Physical Exam  Constitutional: She is oriented to person, place, and time. She appears well-developed and well-nourished.  HENT:  Head: Normocephalic and atraumatic.  Right Ear: External ear normal.  Left Ear: External ear normal.  Nose: Nose normal.  Mouth/Throat: Oropharynx is clear and moist.  Eyes: Conjunctivae and EOM are normal. Pupils are equal, round, and reactive to light.  Neck: Normal range of motion. Neck supple.  Cardiovascular: Normal rate, regular rhythm, normal heart sounds and intact distal pulses.   Respiratory: Effort normal and breath sounds normal.  GI: Bowel sounds are normal.  Musculoskeletal: Normal range of motion.  Neurological: She is alert and oriented to person, place, and time. She has normal reflexes.  Skin: Skin is warm and dry.  Psychiatric: She has a normal mood and affect. Her behavior is normal. Judgment and thought content normal.     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

## 2012-03-04 NOTE — Brief Op Note (Signed)
03/04/2012  7:02 PM  PATIENT:  Vicki Cervantes  71 y.o. 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 rt  POST-OPERATIVE DIAGNOSIS:  spinal stenosis and HNP L5-S1 rt  PROCEDURE:  Procedure(s) (LRB): DECOMPRESSIVE LUMBAR LAMINECTOMY LEVEL 2 (N/A)and microdiscectomy L5-S1 rt  SURGEON:  Surgeon(s) and Role:    * Drucilla Schmidt, MD - Primary    * Jacki Cones, MD - Assisting  PHYSICIAN ASSISTANT:   ASSISTANTS: nurse  ANESTHESIA:   general  EBL:     BLOOD ADMINISTERED:none  DRAINS: none   LOCAL MEDICATIONS USED:  NONE  SPECIMEN:  Excision  DISPOSITION OF SPECIMEN:  PATHOLOGY  COUNTS:  YES  TOURNIQUET:  * No tourniquets in log *  DICTATION: .Other Dictation: Dictation Number [id number]  PLAN OF CARE: Admit to inpatient   PATIENT DISPOSITION:  PACU - hemodynamically stable.   Delay start of Pharmacological VTE agent (>24hrs) due to surgical blood loss or risk of bleeding: yes

## 2012-03-05 NOTE — Progress Notes (Signed)
CSW consulted for SNF placement. PN reviewed. Pt is planning to return home following hospital d/c. RNCM will assist with d/c needs. CSW is available to assist if d/c plan changes and SNF placement is needed.  Cori Razor LCSW [phone]

## 2012-03-05 NOTE — Evaluation (Signed)
Occupational Therapy Evaluation Patient Details Name: Vicki Cervantes MRN: [id number] DOB: May 30, 1941 Today's Date: 03/05/2012 Time: 4098-1191 OT Time Calculation (min): 23 min  OT Assessment / Plan / Recommendation Clinical Impression  Pt doing well POD 1 DECOMPRESSIVE LUMBAR LAMINECTOMY LEVEL 2 and microdiscectomy L5-S1. All education completed. Pt will have necessary level of A from family upon d/c and has all necessary DME.    OT Assessment  Patient does not need any further OT services    Follow Up Recommendations  No OT follow up    Barriers to Discharge      Equipment Recommendations  None recommended by OT    Recommendations for Other Services    Frequency       Precautions / Restrictions Precautions Precautions: Back   Pertinent Vitals/Pain Reported 4/10 pain. Repositioned for comfort.    ADL  Grooming: Simulated;Supervision/safety Where Assessed - Grooming: Supported standing Upper Body Bathing: Simulated;Set up Where Assessed - Upper Body Bathing: Unsupported sitting Lower Body Bathing: Simulated;Set up Where Assessed - Lower Body Bathing: Supported sit to stand Upper Body Dressing: Simulated;Set up Where Assessed - Upper Body Dressing: Unsupported sitting Lower Body Dressing: Simulated;Set up Where Assessed - Lower Body Dressing: Supported sit to stand Toilet Transfer: Research scientist (life sciences) Method: Sit to Barista: Raised toilet seat with arms (or 3-in-1 over toilet) Toileting - Clothing Manipulation and Hygiene: Simulated;Supervision/safety Where Assessed - Toileting Clothing Manipulation and Hygiene: Sit to stand from 3-in-1 or toilet Tub/Shower Transfer: Performed;Min guard Tub/Shower Transfer Method: Science writer: Walk in shower Equipment Used: Rolling walker Transfers/Ambulation Related to ADLs: Pt ambulated to the bathroom with supervision. Educated pt how to safely step into  and out of shower. ADL Comments: Pt able to recall 3/3 back precautions. Educated pt how to complete LB ADLs by crossing one ankle over the opposite knee.    OT Diagnosis:    OT Problem List:   OT Treatment Interventions:     OT Goals    Visit Information  Last OT Received On: 03/05/12 Assistance Needed: +1    Subjective Data  Subjective: I do play a little bit of golf. Patient Stated Goal: Walking without pain.   Prior Functioning  Vision/Perception  Home Living Lives With: Spouse Available Help at Discharge: Family;Available 24 hours/day;Friend(s) Type of Home: House Home Access: Stairs to enter [employer] of Steps: 3 Entrance Stairs-Rails: None Home Layout: Two level Alternate Level Stairs-Number of Steps: flight + landing + 5 more Alternate Level Stairs-Rails: Right;Left;Can reach both Bathroom Shower/Tub: Health visitor: Handicapped height Home Adaptive Equipment: Shower chair without back;Walker - rolling;Straight cane;Bedside commode/3-in-1 Prior Function Level of Independence: Independent Able to Take Stairs?: Yes Driving: Yes Vocation: Retired Musician: No difficulties Dominant Hand: Right      Cognition  Overall Cognitive Status: Appears within functional limits for tasks assessed/performed Arousal/Alertness: Awake/alert Orientation Level: Appears intact for tasks assessed Behavior During Session: [facility] for tasks performed    Extremity/Trunk Assessment Right Upper Extremity Assessment RUE ROM/Strength/Tone: [facility] for tasks assessed Left Upper Extremity Assessment LUE ROM/Strength/Tone: WFL for tasks assessed   Mobility Transfers Transfers: Sit to Stand;Stand to Sit Sit to Stand: 5: Supervision;4: Min guard;With upper extremity assist;With armrests;From chair/3-in-1 Stand to Sit: 5: Supervision;With upper extremity assist;To chair/3-in-1;With armrests Details for Transfer Assistance: min VCs for hand  placement.   Exercise    Balance    End of Session OT - End of Session Activity Tolerance: Patient tolerated treatment well Patient left:  in chair;with call bell/phone within reach  GO     Faduma Cho A OTR/L [phone] 03/05/2012, 10:37 AM

## 2012-03-05 NOTE — Evaluation (Signed)
Physical Therapy Evaluation Patient Details Name: Vicki Cervantes MRN: [id number] DOB: March 13, 1941 Today's Date: 03/05/2012 Time: 1478-2956 PT Time Calculation (min): 16 min  PT Assessment / Plan / Recommendation Clinical Impression  Pt s/p central decompressive laminectomy at L4-L5 and L5-S1.  Pt would benefit from acute PT services in order to improve independence with transfers, ambulation, and stairs while maintaining back precautions for safe d/c home with spouse.    PT Assessment  Patient needs continued PT services    Follow Up Recommendations  No PT follow up    Barriers to Discharge        Equipment Recommendations  None recommended by OT;None recommended by PT    Recommendations for Other Services     Frequency Min 5X/week    Precautions / Restrictions Precautions Precautions: Back   Pertinent Vitals/Pain 3/10 back pain, repositioned, PCA encouraged.      Mobility  Bed Mobility Bed Mobility: Rolling Left;Left Sidelying to Sit Rolling Left: 4: Min guard;With rail Left Sidelying to Sit: 3: Mod assist;HOB flat;With rails Details for Bed Mobility Assistance: verbal cues for log roll technique, assist for trunk to rise Transfers Transfers: Stand to Sit;Sit to Stand Sit to Stand: 4: Min guard;From bed Stand to Sit: 4: Min guard;To chair/3-in-1 Details for Transfer Assistance: verbal cues for safe technique with RW Ambulation/Gait Ambulation/Gait Assistance: 4: Min guard Ambulation Distance (Feet): 160 Feet Assistive device: Rolling walker Ambulation/Gait Assistance Details: verbal cues for no twisting and safe use of RW Gait Pattern: Step-through pattern;Decreased stride length    Exercises     PT Diagnosis: Difficulty walking;Acute pain  PT Problem List: Decreased activity tolerance;Decreased mobility;Decreased knowledge of precautions;Pain;Decreased knowledge of use of DME PT Treatment Interventions: DME instruction;Gait training;Stair training;Functional  mobility training;Therapeutic activities;Therapeutic exercise;Patient/family education   PT Goals Acute Rehab PT Goals PT Goal Formulation: With patient Time For Goal Achievement: 03/10/12 Potential to Achieve Goals: Good Pt will go Supine/Side to Sit: with supervision PT Goal: Supine/Side to Sit - Progress: Goal set today Pt will go Sit to Supine/Side: with supervision PT Goal: Sit to Supine/Side - Progress: Goal set today Pt will go Sit to Stand: with supervision PT Goal: Sit to Stand - Progress: Goal set today Pt will go Stand to Sit: with supervision PT Goal: Stand to Sit - Progress: Goal set today Pt will Ambulate: with supervision;>150 feet;with least restrictive assistive device PT Goal: Ambulate - Progress: Goal set today Pt will Go Up / Down Stairs: 3-5 stairs;with least restrictive assistive device;with supervision PT Goal: Up/Down Stairs - Progress: Goal set today Additional Goals Additional Goal #1: Pt will verbalize and demonstrate all back precautions independently. PT Goal: Additional Goal #1 - Progress: Goal set today  Visit Information  Last PT Received On: 03/05/12 Assistance Needed: +1    Subjective Data  Subjective: Will come help me back to bed because I want to use the right technique?   Prior Functioning  Home Living Lives With: Spouse Available Help at Discharge: Family;Available 24 hours/day;Friend(s) Type of Home: House Home Access: Stairs to enter [employer] of Steps: 3 Entrance Stairs-Rails: None Home Layout: Two level Alternate Level Stairs-Number of Steps: flight + landing + 5 more Alternate Level Stairs-Rails: Right;Left;Can reach both Bathroom Shower/Tub: Health visitor: Handicapped height Home Adaptive Equipment: Shower chair without back;Walker - rolling;Straight cane;Bedside commode/3-in-1 Prior Function Level of Independence: Independent Able to Take Stairs?: Yes Driving: Yes Vocation:  Retired Musician: No difficulties Dominant Hand: Right    Cognition  Overall Cognitive Status: Appears within functional limits for tasks assessed/performed Arousal/Alertness: Awake/alert Orientation Level: Appears intact for tasks assessed Behavior During Session: [facility] for tasks performed    Extremity/Trunk Assessment Right Upper Extremity Assessment RUE ROM/Strength/Tone: [facility] for tasks assessed Left Upper Extremity Assessment LUE ROM/Strength/Tone: [facility] for tasks assessed Right Lower Extremity Assessment RLE ROM/Strength/Tone: [facility] for tasks assessed Left Lower Extremity Assessment LLE ROM/Strength/Tone: [facility] for tasks assessed   Balance    End of Session PT - End of Session Activity Tolerance: Patient tolerated treatment well Patient left: in chair;with call bell/phone within reach  GP     Suhey Radford,KATHrine E 03/05/2012, 11:29 AM Pager: [phone]

## 2012-03-05 NOTE — Progress Notes (Signed)
Patient ID: Vicki Cervantes, female   DOB: 1941-07-09, 71 y.o.   MRN: [id number] Good night.  Pre-op sx gone.  No numbness or weakness.  Plan to have PT see today, d/c foley, home tomorrow.

## 2012-03-05 NOTE — Progress Notes (Signed)
Physical Therapy Treatment Note   03/05/12 1500  PT Visit Information  Last PT Received On 03/05/12  Assistance Needed +1  PT Time Calculation  PT Start Time 1438  PT Stop Time 1454  PT Time Calculation (min) 16 min  Subjective Data  Subjective Oh, it's nice to get up and walk again.  Precautions  Precautions Back  Cognition  Overall Cognitive Status Appears within functional limits for tasks assessed/performed  Bed Mobility  Bed Mobility Sit to Sidelying Left  Sit to Sidelying Left 4: Min assist;HOB flat  Details for Bed Mobility Assistance verbal cues for log roll technique, assist for feet up to bed  Transfers  Transfers Stand to Sit;Sit to Stand  Sit to Stand 4: Min guard;With upper extremity assist;From chair/3-in-1  Stand to Sit 4: Min guard;With upper extremity assist;To bed  Details for Transfer Assistance verbal cues for safe technique with RW  Ambulation/Gait  Ambulation/Gait Assistance 4: Min guard  Ambulation Distance (Feet) 220 Feet  Assistive device Rolling walker  Ambulation/Gait Assistance Details pt lightly using RW, discussed possibly not using RW tomorrow  Gait Pattern Step-through pattern;Decreased stride length  PT - End of Session  Activity Tolerance Patient tolerated treatment well  Patient left in bed;with call bell/phone within reach  PT - Assessment/Plan  Comments on Treatment Session Pt doing well with mobility.  Pt agreeable to perform stairs and ambulate prior to d/c tomorrow.  Reviewed back precautions with pt.  PT Plan Discharge plan remains appropriate;Frequency remains appropriate  Follow Up Recommendations No PT follow up  Equipment Recommended None recommended by OT;None recommended by PT  Acute Rehab PT Goals  PT Goal: Sit to Supine/Side - Progress Progressing toward goal  PT Goal: Sit to Stand - Progress Progressing toward goal  PT Goal: Stand to Sit - Progress Progressing toward goal  PT Goal: Ambulate - Progress Progressing toward goal   Additional Goals  PT Goal: Additional Goal #1 - Progress Progressing toward goal  PT General Charges  $$ ACUTE PT VISIT 1 Procedure  PT Treatments  $Gait Training 8-22 mins    Zenovia Jarred, PT Pager: [phone]

## 2012-03-05 NOTE — Plan of Care (Signed)
Problem: Consults Goal: Diagnosis - Spinal Surgery Outcome: Completed/Met Date Met:  03/05/12 Decompressing Lumbar Laminectomy and Microdiscectomy

## 2012-03-05 NOTE — Op Note (Signed)
NAMEGABRIANNA, FASSNACHT                ACCOUNT NO.:  [id number]  MEDICAL RECORD NO.:  [id number]  LOCATION:  [facility]                         FACILITY:  [facility]  PHYSICIAN:  Marlowe Kays, M.D.  DATE OF BIRTH:  [birth year]  DATE OF PROCEDURE:  03/04/2012 DATE OF DISCHARGE:                              OPERATIVE REPORT   PREOPERATIVE DIAGNOSES: 1. 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 2. 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  POSTOPERATIVE DIAGNOSES: 1. 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 2. 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  OPERATION: 1. Central decompressive laminectomy at L4-L5 and L5-S1. 2. Microdiskectomy at L5-S1 right.  SURGEON:  Marlowe Kays, M.D.  ASSISTANT:  Georges Lynch. Darrelyn Hillock, M.D.  ANESTHESIA:  General.  PATHOLOGY AND JUSTIFICATION FOR PROCEDURE:  Severe pain primarily in the right leg demonstrating the above diagnoses.  She has no neurologic deficit.  She is here today for the above-mentioned surgery because of the severe pain.  PROCEDURE IN DETAILS:  Prophylactic antibiotics, satisfied general anesthesia, Foley catheter inserted, prone position on rolls.  Back was prepped with DuraPrep, draped in sterile field.  With three spinal needles and lateral x-ray, we tentatively located the L4-L5 and L5-S1 interspaces.  Time-out was performed.  Midline incision with soft tissue dissected off what appeared [place] to be spinous processes at L4 and L5, which we tagged with Kocher clamps, took a lateral x-ray confirming the locations.  We then continued with dissection and placed self-retaining McCullough retractors.  With double-action rongeur, I removed the spinous processes of L5 and portion of L4, and then followed this by partial removal of neural arches with double-action rongeur, and then we moved to use a microscope for final decompression.  She had significant spinal stenosis L4-L5.  We had a little difficulty finding the disk at L5-S1 because what appeared to be a very thin dura and a good bit  of bony overgrowth laterally on the right.  We ultimately identified the S1 nerve root and carefully mobilized the dura and the nerve root medially. The L5-S1 disk was identified and opened with 15 knife blade.  We were able to remove a moderate amount of medially type of disk material, but pathology appeared to be a combination of disk and osteophyte formation. We removed all disk material obtainable with a combination of nerve hook, Epstein curette, and micropituitary.  We then checked and all foramina were patent to hockey-stick.  Wound was irrigated with sterile saline and then closure performed with interrupted #1 Vicryl in the paralumbar muscle and fascia, 2-0 Vicryl and subcutaneous tissue, staples in the skin.  Betadine, Adaptic, dry sterile dressing were applied.  She was gently rolled on her PACU bed and taken in satisfactory condition with no known complications. Estimated blood loss was 175 mL.  No blood replacement.          ______________________________ Marlowe Kays, M.D.     JA/MEDQ  D:  03/04/2012  T:  03/05/2012  Job:  [id number]

## 2012-03-06 MED ORDER — OXYCODONE HCL 5 MG PO TABS
5.0000 mg | ORAL_TABLET | ORAL | Status: DC | PRN
  Administered 2012-03-06 – 2012-03-09 (×14): 10 mg via ORAL
  Filled 2012-03-06 (×14): qty 2

## 2012-03-06 MED ORDER — OXYCODONE-ACETAMINOPHEN 5-325 MG PO TABS
1.0000 | ORAL_TABLET | ORAL | Status: DC | PRN
  Administered 2012-03-06: 2 via ORAL
  Administered 2012-03-06: 1 via ORAL
  Filled 2012-03-06: qty 2
  Filled 2012-03-06: qty 1

## 2012-03-06 MED ORDER — IBUPROFEN 200 MG PO TABS
200.0000 mg | ORAL_TABLET | ORAL | Status: DC | PRN
  Filled 2012-03-06: qty 1

## 2012-03-06 NOTE — Progress Notes (Signed)
Patient states that the percocet she is currently  taking is not controlling her pain very well. Patient states she lingers around a 7 or 8 out of 10 continuously. Patient is planning for D/C tomorrow and PCA was D/C around noon today. Patient stated she is afraid her pain will not be well controlled upon discharge. On call PA Gill paged with orders to give Oxy IR 5-10 mg q3H PRN for moderate-severe pain. Will continue to monitor patient.

## 2012-03-06 NOTE — Care Management Note (Signed)
    Page 1 of 1   03/06/2012     4:22:59 PM   CARE MANAGEMENT NOTE 03/06/2012  Patient:  [facility]   Account Number:  [id number]  Date Initiated:  03/06/2012  Documentation initiated by:  Colleen Can  Subjective/Objective Assessment:   dx spinal stenosis L4-5, central disc herniation L5-S1  Central decompressive laminectomyL4-5, L5-S1, microdiskectomy     Action/Plan:   CM spoke with patient. Plans are for patient to return to her home where she will have caregiver. She already has DME.  If she needs HH will use [facility] or [facility]. No recommendations for [facility] services at this time   Anticipated DC Date:  03/07/2012   Anticipated DC Plan:  HOME/SELF CARE  In-house referral  NA      DC Planning Services  CM consult      PAC Choice  NA   Choice offered to / List presented to:  C-1 Patient   DME arranged  NA      DME agency  NA        Status of service:  Completed, signed off Medicare Important Message given?  NA - LOS <3 / Initial given by admissions (If response is "NO", the following Medicare IM given date fields will be blank) Date Medicare IM given:   Date Additional Medicare IM given:    Discharge Disposition:    Per UR Regulation:  Reviewed for med. necessity/level of care/duration of stay  If discussed at Long Length of Stay Meetings, dates discussed:    Comments:

## 2012-03-06 NOTE — Progress Notes (Signed)
Physical Therapy Treatment Note   03/06/12 1500  PT Visit Information  Last PT Received On 03/06/12  Assistance Needed +1  PT Time Calculation  PT Start Time 1440  PT Stop Time 1451  PT Time Calculation (min) 11 min  Subjective Data  Subjective Oh good.  I was trying to fall asleep and now I can nap in the bed.  Precautions  Precautions Back  Restrictions  Weight Bearing Restrictions No  Cognition  Overall Cognitive Status Appears within functional limits for tasks assessed/performed  Bed Mobility  Bed Mobility Sit to Supine  Sit to Sidelying Left 4: Min assist;HOB flat (chair armest as nightstand)  Details for Bed Mobility Assistance increased time with increased verbal cues for technique, sister present and also educated in log roll technique, also needed assist for LEs to roll onto back into supine, pt reports spouse will be able to help with bed mobility.  Transfers  Transfers Sit to Stand;Stand Pivot Transfers;Stand to Sit  Sit to Stand 4: Min guard;With upper extremity assist;From chair/3-in-1  Stand to Sit 4: Min guard;With upper extremity assist;To bed  Stand Pivot Transfers 4: Min guard  Details for Transfer Assistance verbal cues for hand placement  PT - End of Session  Activity Tolerance Patient tolerated treatment well  Patient left in bed;with call bell/phone within reach;with family/visitor present  PT - Assessment/Plan  Comments on Treatment Session Pt assisted back to bed and reviewed log roll technique with pt and sister.  PT Plan Discharge plan remains appropriate;Frequency remains appropriate  Follow Up Recommendations No PT follow up  Equipment Recommended None recommended by OT;None recommended by PT  Acute Rehab PT Goals  PT Goal: Sit to Supine/Side - Progress Progressing toward goal  PT Goal: Sit to Stand - Progress Progressing toward goal  PT Goal: Stand to Sit - Progress Progressing toward goal  Additional Goals  PT Goal: Additional Goal #1 -  Progress Progressing toward goal  PT General Charges  $$ ACUTE PT VISIT 1 Procedure  PT Treatments  $Therapeutic Activity 8-22 mins    Zenovia Jarred, PT Pager: [phone]

## 2012-03-06 NOTE — Progress Notes (Signed)
Physical Therapy Treatment Patient Details Name: Vicki Cervantes MRN: [id number] DOB: 1940/11/09 Today's Date: 03/06/2012 Time: 4098-1191 PT Time Calculation (min): 19 min  PT Assessment / Plan / Recommendation Comments on Treatment Session  Pt moving slower today and continues to require assist for bed mobility.  Pt agreeable to attempt stairs this afternoon.    Follow Up Recommendations  No PT follow up    Barriers to Discharge        Equipment Recommendations  None recommended by OT;None recommended by PT    Recommendations for Other Services    Frequency     Plan Discharge plan remains appropriate;Frequency remains appropriate    Precautions / Restrictions Precautions Precautions: Back Restrictions Weight Bearing Restrictions: No   Pertinent Vitals/Pain 3/10 back pain, PCA encouraged, repositioned    Mobility  Bed Mobility Bed Mobility: Rolling Left;Left Sidelying to Sit Rolling Left: 4: Min assist;With rail Left Sidelying to Sit: 3: Mod assist;HOB flat Details for Bed Mobility Assistance: increased time due to reviewing log roll technique and pt not coming completely into sidelying Transfers Transfers: Stand to Sit;Sit to Stand Sit to Stand: 4: Min guard;With upper extremity assist;From bed Stand to Sit: 4: Min guard;With upper extremity assist;To chair/3-in-1 Details for Transfer Assistance: verbal cues for hand placement Ambulation/Gait Ambulation/Gait Assistance: 4: Min guard Ambulation Distance (Feet): 60 Feet Assistive device: Rolling walker Ambulation/Gait Assistance Details: pt reporting not feeling as well today (not pain wise but unable to describe feeling) so maintained use of RW Gait Pattern: Step-through pattern;Decreased stride length Gait velocity: decreased    Exercises     PT Diagnosis:    PT Problem List:   PT Treatment Interventions:     PT Goals Acute Rehab PT Goals PT Goal: Supine/Side to Sit - Progress: Progressing toward goal PT Goal:  Sit to Stand - Progress: Progressing toward goal PT Goal: Stand to Sit - Progress: Progressing toward goal PT Goal: Ambulate - Progress: Progressing toward goal Additional Goals PT Goal: Additional Goal #1 - Progress: Progressing toward goal  Visit Information  Last PT Received On: 03/06/12 Assistance Needed: +1    Subjective Data  Subjective: I'm just afraid I'm going to hurt it. (back)   Cognition  Overall Cognitive Status: Appears within functional limits for tasks assessed/performed    Balance     End of Session PT - End of Session Activity Tolerance: Patient tolerated treatment well Patient left: in chair;with call bell/phone within reach;with family/visitor present   GP     Wayne Brunker,KATHrine E 03/06/2012, 12:17 PM Pager: [phone]

## 2012-03-06 NOTE — Progress Notes (Signed)
Patient ID: Vicki Cervantes, female   DOB: 05/29/1941, 71 y.o.   MRN: [id number] POD 2--she doesn't feel well this am with no specific complaints except rt LBP requiring PCA.  She was up twice yesterday.  Will see how she does as day goes along.

## 2012-03-06 NOTE — Progress Notes (Signed)
Physical Therapy Treatment Note   03/06/12 1300  PT Visit Information  Last PT Received On 03/06/12  Assistance Needed +1  PT Time Calculation  PT Start Time 1304  PT Stop Time 1321  PT Time Calculation (min) 17 min  Subjective Data  Subjective I'll try those stairs if you think I'm ready.  Precautions  Precautions Back  Restrictions  Weight Bearing Restrictions No  Cognition  Overall Cognitive Status Appears within functional limits for tasks assessed/performed  Transfers  Transfers Stand to Sit;Sit to Stand  Sit to Stand 4: Min guard;With upper extremity assist;From chair/3-in-1  Stand to Sit 4: Min guard;With upper extremity assist;To chair/3-in-1  Details for Transfer Assistance verbal cues for hand placement  Ambulation/Gait  Ambulation/Gait Assistance 4: Min guard  Ambulation Distance (Feet) 200 Feet  Assistive device Rolling walker  Ambulation/Gait Assistance Details verbal cues for step length  Gait Pattern Step-through pattern;Decreased stride length  Gait velocity decreased  Stairs Yes  Stairs Assistance 4: Min guard  Stairs Assistance Details (indicate cue type and reason) verbal cues for safe technique, performed once with PT then had spouse perform HHA and performed again  Stair Management Technique Step to pattern;Forwards (1 hha)  Number of Stairs 3   PT - End of Session  Activity Tolerance Patient tolerated treatment well  Patient left in chair;with call bell/phone within reach;with family/visitor present  PT - Assessment/Plan  Comments on Treatment Session Pt performed stairs and ambulated in hallway. Pt requests to remain up in chair but have PT come back to assist with log roll bed mobility later.  PT Plan Discharge plan remains appropriate;Frequency remains appropriate  Follow Up Recommendations No PT follow up  Equipment Recommended None recommended by OT;None recommended by PT  Acute Rehab PT Goals  PT Goal: Supine/Side to Sit - Progress Progressing  toward goal  PT Goal: Sit to Stand - Progress Progressing toward goal  PT Goal: Stand to Sit - Progress Progressing toward goal  PT Goal: Ambulate - Progress Progressing toward goal  PT Goal: Up/Down Stairs - Progress Progressing toward goal  Additional Goals  PT Goal: Additional Goal #1 - Progress Progressing toward goal  PT General Charges  $$ ACUTE PT VISIT 1 Procedure  PT Treatments  $Gait Training 8-22 mins    Zenovia Jarred, PT Pager: [phone]

## 2012-03-07 MED ORDER — HYDROMORPHONE HCL 2 MG PO TABS
2.0000 mg | ORAL_TABLET | ORAL | Status: DC | PRN
  Administered 2012-03-07 – 2012-03-09 (×7): 4 mg via ORAL
  Filled 2012-03-07 (×6): qty 2
  Filled 2012-03-07: qty 1
  Filled 2012-03-07: qty 2

## 2012-03-07 NOTE — Progress Notes (Signed)
Physical Therapy Treatment Patient Details Name: Vicki Cervantes MRN: [id number] DOB: 1940-08-02 Today's Date: 03/07/2012 Time: 4098-1191 PT Time Calculation (min): 18 min  PT Assessment / Plan / Recommendation Comments on Treatment Session  Pt able to recall 2/3 back precautions.  Pt still requiring assist for bed mobility but reports spouse will assist her at home.    Follow Up Recommendations  No PT follow up    Barriers to Discharge        Equipment Recommendations  None recommended by OT;None recommended by PT    Recommendations for Other Services    Frequency     Plan Discharge plan remains appropriate;Frequency remains appropriate    Precautions / Restrictions Precautions Precautions: Back Restrictions Weight Bearing Restrictions: No   Pertinent Vitals/Pain 5/10 back pain, premedicated, repositioned    Mobility  Bed Mobility Rolling Left: 5: Supervision Left Sidelying to Sit: 3: Mod assist;HOB flat;With rails Details for Bed Mobility Assistance: pt continues to require verbal cues and assist to raise trunk from sidelying using log roll technique Transfers Transfers: Sit to Stand;Stand to Sit Sit to Stand: 4: Min guard;With upper extremity assist;From bed Stand to Sit: 4: Min guard;With upper extremity assist;To chair/3-in-1 Details for Transfer Assistance: one verbal cue for hand placement Ambulation/Gait Ambulation/Gait Assistance: 4: Min guard Ambulation Distance (Feet): 180 Feet Assistive device: Rolling walker Ambulation/Gait Assistance Details: verbal cue for small steps with turn (no twisting) Gait Pattern: Step-through pattern;Decreased stride length Gait velocity: decreased    Exercises     PT Diagnosis:    PT Problem List:   PT Treatment Interventions:     PT Goals Acute Rehab PT Goals PT Goal: Supine/Side to Sit - Progress: Progressing toward goal PT Goal: Sit to Stand - Progress: Progressing toward goal PT Goal: Stand to Sit - Progress:  Progressing toward goal PT Goal: Ambulate - Progress: Progressing toward goal Additional Goals PT Goal: Additional Goal #1 - Progress: Progressing toward goal  Visit Information  Last PT Received On: 03/07/12 Assistance Needed: +1    Subjective Data  Subjective: I'm ready for you.   Cognition  Overall Cognitive Status: Appears within functional limits for tasks assessed/performed    Balance     End of Session PT - End of Session Activity Tolerance: Patient tolerated treatment well Patient left: in chair;with call bell/phone within reach;with family/visitor present   GP     Montoya Watkin,KATHrine E 03/07/2012, 11:23 AM Pager: [phone]

## 2012-03-07 NOTE — Progress Notes (Signed)
Orthopedics Progress Note  Subjective: Pt states she feels miserable today due to the pain and pressure in the low back  Denies pain or tingling in the legs  Uncomfortable during exam today  Objective:  Filed Vitals:   03/07/12 0623  BP: 112/67  Pulse: 90  Temp: 98.5 F (36.9 C)  Resp: 16    General: Awake and alert  Musculoskeletal: nv intact distally, dressing change per nursing while up with PT Neurovascularly intact   Assessment/Plan: s/p Procedure(s): DECOMPRESSIVE LUMBAR LAMINECTOMY LEVEL 2  Pain control  PT/OT  D/c planning once pain under better control  Viviann Spare R. Ranell Patrick, MD 03/07/2012 7:33 AM

## 2012-03-08 NOTE — Progress Notes (Signed)
Subjective: 4 Days Post-Op Procedure(s) (LRB): DECOMPRESSIVE LUMBAR LAMINECTOMY LEVEL 2 (N/A) Patient reports pain as severe.  Pt doesn't think she can handle the current level of pain at home.  She has been up with PT.  Denies numbness, tingling or weakness in LEs.  Objective: Vital signs in last 24 hours: Temp:  [98.5 F (36.9 C)-101.1 F (38.4 C)] 98.5 F (36.9 C) (08/25 0510) Pulse Rate:  [54-99] 54  (08/25 1038) Resp:  [16-18] 16  (08/25 0510) BP: (90-104)/(43-64) 90/43 mmHg (08/25 1038) SpO2:  [90 %-96 %] 95 % (08/25 0510)  Intake/Output from previous day: 08/24 0701 - 08/25 0700 In: 860 [P.O.:540; I.V.:320] Out: 2700 [Urine:2700] Intake/Output this shift:   Intact pulses distally  ACtive PF  /DF at ankles and toes.  Feels LT rhoguhtou B LEs.  Wound dressed and dry.  Assessment/Plan: 4 Days Post-Op Procedure(s) (LRB): DECOMPRESSIVE LUMBAR LAMINECTOMY LEVEL 2 (N/A) Up with therapy  Continue oral pain meds.  Wean from IV pain meds.  Hopefully d/c home tomorrow.  Winslow Ederer 03/08/2012, 11:01 AM

## 2012-03-08 NOTE — Progress Notes (Signed)
Bp low, HCTZ held. Dr Victorino Dike aware. Kemya Shed, [employer]

## 2012-03-08 NOTE — Progress Notes (Signed)
Physical Therapy Treatment Patient Details Name: Vicki Cervantes MRN: [id number] DOB: 11-16-1940 Today's Date: 03/08/2012 Time: 8295-6213 PT Time Calculation (min): 24 min  PT Assessment / Plan / Recommendation Comments on Treatment Session  Pt continues to have difficulty with side-lying to sit.  Pt recalled 1/3 back precautions at beginning of session.  Reviewed with pt and husband.  At end of session, pt was able to recall 3/3.  Husband educated on how to A pt with mobility    Follow Up Recommendations  Home health PT    Barriers to Discharge        Equipment Recommendations  None recommended by PT    Recommendations for Other Services    Frequency Min 5X/week   Plan Discharge plan remains appropriate;Frequency remains appropriate    Precautions / Restrictions Precautions Precautions: Back   Pertinent Vitals/Pain Pt pre-medicated. Some grogginess.    Mobility  Bed Mobility Bed Mobility: Supine to Sit;Rolling Left Rolling Left: 5: Supervision Left Sidelying to Sit: HOB flat;With rails;3: Mod assist Details for Bed Mobility Assistance: A to get trunk into upright position Transfers Transfers: Sit to Stand;Stand to Sit Sit to Stand: 5: Supervision;From toilet;From bed;With upper extremity assist Stand to Sit: To chair/3-in-1;To toilet;4: Min guard;With upper extremity assist Ambulation/Gait Ambulation/Gait Assistance: 4: Min guard Ambulation Distance (Feet): 100 Feet Assistive device: Rolling walker Ambulation/Gait Assistance Details: very slow with decreased step length. very guarded Gait Pattern: Decreased step length - left;Decreased step length - right Gait velocity: decreased Stairs: Yes Stairs Assistance: 4: Min assist Stairs Assistance Details (indicate cue type and reason): HHA from husband Stair Management Technique: Step to pattern;Forwards Number of Stairs: 3     Exercises     PT Diagnosis:    PT Problem List:   PT Treatment Interventions:     PT  Goals Acute Rehab PT Goals PT Goal: Supine/Side to Sit - Progress: Progressing toward goal PT Goal: Sit to Stand - Progress: Met PT Goal: Stand to Sit - Progress: Progressing toward goal PT Goal: Ambulate - Progress: Progressing toward goal PT Goal: Up/Down Stairs - Progress: Progressing toward goal Additional Goals Additional Goal #1: Pt will verbalize and demonstrate all back precautions independently. PT Goal: Additional Goal #1 - Progress: Progressing toward goal  Visit Information  Last PT Received On: 03/08/12 Assistance Needed: +1    Subjective Data  Subjective: "I may go home today, but I'm not sure.  Let's walk to the bathroom first."   Cognition  Overall Cognitive Status: Appears within functional limits for tasks assessed/performed Arousal/Alertness: Awake/alert Orientation Level: Appears intact for tasks assessed Behavior During Session: [facility] for tasks performed Cognition - Other Comments: Pt awake, but a little gorggy today.  She thinks from meds.    Balance     End of Session PT - End of Session Activity Tolerance: Patient tolerated treatment well Patient left: in chair;with family/visitor present;with call bell/phone within reach Nurse Communication: Mobility status   GP     [facility] LUBECK 03/08/2012, 10:25 AM

## 2012-03-09 MED ORDER — KETOROLAC TROMETHAMINE 10 MG PO TABS: 10.0000 mg | ORAL_TABLET | Freq: Four times a day (QID) | ORAL | Status: AC | PRN

## 2012-03-09 MED ORDER — METHOCARBAMOL 500 MG PO TABS: 500.0000 mg | ORAL_TABLET | Freq: Four times a day (QID) | ORAL | Status: AC | PRN

## 2012-03-09 MED ORDER — IRBESARTAN 150 MG PO TABS: 150.0000 mg | ORAL_TABLET | Freq: Two times a day (BID) | ORAL | Status: DC

## 2012-03-09 MED ORDER — HYDROCHLOROTHIAZIDE 12.5 MG PO CAPS: 12.5000 mg | ORAL_CAPSULE | Freq: Two times a day (BID) | ORAL | Status: DC

## 2012-03-09 MED ORDER — OXYCODONE-ACETAMINOPHEN 7.5-325 MG PO TABS: 1.0000 | ORAL_TABLET | ORAL | Status: AC | PRN

## 2012-03-09 MED ORDER — METHOCARBAMOL 500 MG PO TABS: 500.0000 mg | ORAL_TABLET | Freq: Four times a day (QID) | ORAL | Status: AC

## 2012-03-09 NOTE — Progress Notes (Signed)
Pt for d/c home today. No IV site noted. Dressing changed to back-CDI. Tegaderm dressing applied to base of incision d/t opened blisters. Dressing supplies provided for home use. Discharge instructions & RX given with verbalized understanding. No changes in am assessments today. Husband at bedside to assist with d/c.

## 2012-03-09 NOTE — Progress Notes (Signed)
Physical Therapy Treatment Patient Details Name: Vicki Cervantes MRN: [id number] DOB: 13-Apr-1941 Today's Date: 03/09/2012 Time: 4098-1191 PT Time Calculation (min): 14 min  PT Assessment / Plan / Recommendation Comments on Treatment Session  Pt ambulated in hallway and able to describe all back precautions correctly.  Pt and spouse had no questions/concerns about d/c home.    Follow Up Recommendations  Home health PT    Barriers to Discharge        Equipment Recommendations  None recommended by PT    Recommendations for Other Services    Frequency     Plan Discharge plan remains appropriate;Frequency remains appropriate    Precautions / Restrictions Precautions Precautions: Back Restrictions Weight Bearing Restrictions: No   Pertinent Vitals/Pain 1/10 back pain with ambulation, premedicated    Mobility  Transfers Transfers: Sit to Stand;Stand to Sit Sit to Stand: 5: Supervision;With upper extremity assist;From chair/3-in-1 Stand to Sit: 5: Supervision;With upper extremity assist;To chair/3-in-1 Details for Transfer Assistance: no verbal cues required, increased time Ambulation/Gait Ambulation/Gait Assistance: 5: Supervision Ambulation Distance (Feet): 200 Feet Assistive device: Rolling walker Ambulation/Gait Assistance Details: encouraged increasing step length and faster pace Gait Pattern: Step-through pattern;Decreased stride length Gait velocity: decreased   Also verbally reviewed log roll technique with pt and spouse.  Exercises     PT Diagnosis:    PT Problem List:   PT Treatment Interventions:     PT Goals Acute Rehab PT Goals PT Goal: Sit to Stand - Progress: Met PT Goal: Stand to Sit - Progress: Met PT Goal: Ambulate - Progress: Met Additional Goals PT Goal: Additional Goal #1 - Progress: Partly met  Visit Information  Last PT Received On: 03/09/12 Assistance Needed: +1    Subjective Data  Subjective: No lifting, bending, twisting.  (husband says,  "no arching, you're making up your own BLT")    Cognition  Overall Cognitive Status: Appears within functional limits for tasks assessed/performed    Balance     End of Session PT - End of Session Activity Tolerance: Patient tolerated treatment well Patient left: in chair;with family/visitor present;with call bell/phone within reach   GP     [facility] E 03/09/2012, 11:57 AM Pager: [phone]

## 2012-03-09 NOTE — Progress Notes (Signed)
Patient ID: Vicki Cervantes, female   DOB: Jul 02, 1941, 71 y.o.   MRN: [id number] POD 5--pain is still an issue but in back only..  She is afebrile and incision is dry.  Will disch on toradol as well as percocet and robaxin.  Inst given.

## 2012-03-09 NOTE — Progress Notes (Signed)
Patient states that the skin over her sacrum is itchy and feels uncomfortable. Upon close inspection, after peeling back the inferior portion of tape on the surgical dressing, there is small blistering of the skin noted.  The blistering extends across sacral area and laterally toward both the mid right and mid left buttocks. Blisters are 1-45mm in diameter, and underlying skin is red and has a tough, callused feel to it.  The blisters directly over the inferior portion of the sacrum are seeping a moderate amount of clear thin drainage. Area cleansed with sterile water and 2x2 gauze. Dried area and applied mepilex over sacrum. Barrier cream applied to lateral aspects of reddened skin. Will continue to monitor and will make day shift aware of wound.

## 2012-03-10 ENCOUNTER — Encounter (HOSPITAL_COMMUNITY): Payer: Self-pay

## 2012-03-12 NOTE — Discharge Summary (Signed)
NAMECARLING, LIBERMAN                ACCOUNT NO.:  [id number]  MEDICAL RECORD NO.:  [id number]  LOCATION:  1614                         FACILITY:  [facility]  PHYSICIAN:  Marlowe Kays, M.D.  DATE OF BIRTH:  08/30/1940  DATE OF ADMISSION:  03/04/2012 DATE OF DISCHARGE:  03/09/2012                              DISCHARGE SUMMARY   ADMITTING DIAGNOSES: 1. Spinal stenosis, L4-5. 2. Herniated nucleus pulposus, L5-S1, right.  DISCHARGE DIAGNOSES: 1. Spinal stenosis, L4-5. 2. Herniated nucleus pulposus, L5-S1, right.  OPERATIONS: 1. Decompressive laminectomy at L4-5 and L5-S1. 2. Microdiskectomy, L5-S1, right.  SUMMARY:  She has developed severe acute pain in her back and right leg with an MRI demonstrating the admission diagnoses.  As soon as surgical arrangements could be accomplished, I performed, assisted by Dr. Darrelyn Hillock, the above-mentioned surgery.  The surgery itself went uneventfully and the following day, her right leg pain had completely relieved, but she had significant back pain over the next few days accompanied by headache and nausea.  She ran no fever.  Ambulation and activity were encouraged by Physical Therapy.  However, she was not doing satisfactorily enough to allow discharge until March 09, 2012, with the limiting factors on discharge being pain control and inability to ambulate well.  She was discharged on Norco 7.5/325 and Robaxin 500. Her incision was dry at the time of discharge, and she was afebrile. She was given postlaminectomy instructions and as long as the wound was dry, she could be showering.  Plan was to have her return to my office in 2 weeks from surgery.  Condition on discharge was stable and improved.          ______________________________ Marlowe Kays, M.D.     JA/MEDQ  D:  03/11/2012  T:  03/12/2012  Job:  [id number]

## 2012-10-20 ENCOUNTER — Encounter: Payer: Self-pay | Admitting: Gastroenterology

## 2012-12-30 ENCOUNTER — Telehealth: Payer: Self-pay | Admitting: Gastroenterology

## 2012-12-30 DIAGNOSIS — D214 Benign neoplasm of connective and other soft tissue of abdomen: Secondary | ICD-10-CM

## 2012-12-31 ENCOUNTER — Other Ambulatory Visit: Payer: Self-pay

## 2012-12-31 DIAGNOSIS — D214 Benign neoplasm of connective and other soft tissue of abdomen: Secondary | ICD-10-CM

## 2012-12-31 NOTE — Telephone Encounter (Signed)
The patient has been notified of this information and all questions answered. EUS 01/07/13 10 am pt meds reviewed and instructed, also mailed instructions and pt will call with any questions

## 2012-12-31 NOTE — Telephone Encounter (Signed)
Left message on machine to call back  

## 2013-01-01 ENCOUNTER — Encounter (HOSPITAL_COMMUNITY): Payer: Self-pay | Admitting: *Deleted

## 2013-01-01 HISTORY — PX: LUMBAR DISC SURGERY: SHX700

## 2013-01-07 ENCOUNTER — Encounter (HOSPITAL_COMMUNITY): Payer: Self-pay | Admitting: Anesthesiology

## 2013-01-07 ENCOUNTER — Ambulatory Visit (HOSPITAL_COMMUNITY)
Admission: RE | Admit: 2013-01-07 | Discharge: 2013-01-07 | Disposition: A | Discharge status: Home / Self Care | Payer: MEDICARE | Source: Ambulatory Visit | Attending: Gastroenterology | Admitting: Gastroenterology

## 2013-01-07 ENCOUNTER — Ambulatory Visit (HOSPITAL_COMMUNITY): Payer: MEDICARE | Admitting: Anesthesiology

## 2013-01-07 ENCOUNTER — Encounter (HOSPITAL_COMMUNITY)
Admission: RE | Disposition: A | Discharge status: Home / Self Care | Payer: Self-pay | Source: Ambulatory Visit | Attending: Gastroenterology

## 2013-01-07 ENCOUNTER — Encounter (HOSPITAL_COMMUNITY): Payer: Self-pay | Admitting: Gastroenterology

## 2013-01-07 DIAGNOSIS — D214 Benign neoplasm of connective and other soft tissue of abdomen: Secondary | ICD-10-CM

## 2013-01-07 DIAGNOSIS — Z96659 Presence of unspecified artificial knee joint: Secondary | ICD-10-CM | POA: Insufficient documentation

## 2013-01-07 DIAGNOSIS — E7800 Pure hypercholesterolemia, unspecified: Secondary | ICD-10-CM | POA: Insufficient documentation

## 2013-01-07 DIAGNOSIS — M4800 Spinal stenosis, site unspecified: Secondary | ICD-10-CM | POA: Insufficient documentation

## 2013-01-07 DIAGNOSIS — Z853 Personal history of malignant neoplasm of breast: Secondary | ICD-10-CM | POA: Insufficient documentation

## 2013-01-07 DIAGNOSIS — E781 Pure hyperglyceridemia: Secondary | ICD-10-CM | POA: Insufficient documentation

## 2013-01-07 DIAGNOSIS — K219 Gastro-esophageal reflux disease without esophagitis: Secondary | ICD-10-CM | POA: Insufficient documentation

## 2013-01-07 DIAGNOSIS — M129 Arthropathy, unspecified: Secondary | ICD-10-CM | POA: Insufficient documentation

## 2013-01-07 DIAGNOSIS — K319 Disease of stomach and duodenum, unspecified: Secondary | ICD-10-CM

## 2013-01-07 DIAGNOSIS — I10 Essential (primary) hypertension: Secondary | ICD-10-CM | POA: Insufficient documentation

## 2013-01-07 HISTORY — PX: EUS: SHX5427

## 2013-01-07 SURGERY — UPPER ENDOSCOPIC ULTRASOUND (EUS) LINEAR
Anesthesia: General

## 2013-01-07 MED ORDER — PROPOFOL INFUSION 10 MG/ML OPTIME
INTRAVENOUS | Status: DC | PRN
  Administered 2013-01-07: 100 ug/kg/min via INTRAVENOUS

## 2013-01-07 MED ORDER — KETAMINE HCL 10 MG/ML IJ SOLN
INTRAMUSCULAR | Status: DC | PRN
  Administered 2013-01-07 (×3): 10 mg via INTRAVENOUS

## 2013-01-07 MED ORDER — MIDAZOLAM HCL 5 MG/5ML IJ SOLN
INTRAMUSCULAR | Status: DC | PRN
  Administered 2013-01-07 (×2): 1 mg via INTRAVENOUS

## 2013-01-07 MED ORDER — BUTAMBEN-TETRACAINE-BENZOCAINE 2-2-14 % EX AERO
INHALATION_SPRAY | CUTANEOUS | Status: DC | PRN
  Administered 2013-01-07: 2 via TOPICAL

## 2013-01-07 MED ORDER — SODIUM CHLORIDE 0.9 % IV SOLN: INTRAVENOUS | Status: DC

## 2013-01-07 MED ORDER — LACTATED RINGERS IV SOLN
INTRAVENOUS | Status: DC
  Administered 2013-01-07: 10:00:00 via INTRAVENOUS

## 2013-01-07 NOTE — Op Note (Signed)
[facility] [address] [place], [address]   ENDOSCOPIC ULTRASOUND PROCEDURE REPORT  PATIENT: Vicki, Cervantes  MR#: #[id number] BIRTHDATE: 1941-06-27  GENDER: Female ENDOSCOPIST: Rachael Fee, MD PROCEDURE DATE:  01/07/2013 PROCEDURE:   Upper EUS ASA CLASS:      Class III INDICATIONS:   15.11mm by 12.29mm round, discrete subepithelial lesion that may have increased slightly in size since last EUS in 2008 (was 13.30mm by 13.81mm). Here for interval EUS . MEDICATIONS: MAC sedation, administered by CRNA  DESCRIPTION OF PROCEDURE:   After the risks benefits and alternatives of the procedure were  explained, informed consent was obtained. The patient was then placed in the left, lateral, decubitus postion and IV sedation was administered. Throughout the procedure, the patients blood pressure, pulse and oxygen saturations were monitored continuously.  Under direct visualization, the 110040  endoscope was introduced through the mouth  and advanced to the second portion of the duodenum .  Water was used as necessary to provide an acoustic interface.  Upon completion of the imaging, water was removed and the patient was sent to the recovery room in satisfactory condition.   Endoscopic findings: 1. Round bulging inward of otherwise normal gastric mucosa in proximal stomach. 2. The examination was otherwise normal  EUS findings: 1. The lesion above corresponded with a round, homogeneous, hypoechoic, well circumscribed mass that clear communicates with the muscularis propria layer of the proximal gastric wall.  The mass was 1.7cm maximally. 2. No perigastric adenopathy 3. 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  I would like to repeat surveillance EUS in 2 years and if at that time there  is no signficant change, then she would need no further surveillance testing.  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   _______________________________ eSigned:  Rachael Fee, MD 01/07/2013 10:25 AM    cc: Yancey Flemings, MD; Geoffry Paradise, MD

## 2013-01-07 NOTE — Anesthesia Postprocedure Evaluation (Signed)
  Anesthesia Post-op Note  Patient: Vicki Cervantes  Procedure(s) Performed: Procedure(s) (LRB): UPPER ENDOSCOPIC ULTRASOUND (EUS) LINEAR (N/A)  Patient Location: PACU  Anesthesia Type: MAC  Level of Consciousness: awake and alert   Airway and Oxygen Therapy: Patient Spontanous Breathing  Post-op Pain: mild  Post-op Assessment: Post-op Vital signs reviewed, Patient's Cardiovascular Status Stable, Respiratory Function Stable, Patent Airway and No signs of Nausea or vomiting  Last Vitals:  Filed Vitals:   01/07/13 1050  BP: 119/73  Pulse:   Temp:   Resp: 13    Post-op Vital Signs: stable   Complications: No apparent anesthesia complications

## 2013-01-07 NOTE — Anesthesia Preprocedure Evaluation (Addendum)
Anesthesia Evaluation  Patient identified by MRN, date of birth, ID band Patient awake    Reviewed: Allergy & Precautions, H&P , NPO status , Patient's Chart, lab work & pertinent test results  History of Anesthesia Complications (+) PONV  Airway Mallampati: II TM Distance: >3 FB Neck ROM: Full    Dental no notable dental hx.    Pulmonary neg pulmonary ROS,  breath sounds clear to auscultation  Pulmonary exam normal       Cardiovascular hypertension, Pt. on medications Rhythm:Regular Rate:Normal     Neuro/Psych negative neurological ROS  negative psych ROS   GI/Hepatic negative GI ROS, Neg liver ROS,   Endo/Other  negative endocrine ROS  Renal/GU negative Renal ROS  negative genitourinary   Musculoskeletal negative musculoskeletal ROS (+)   Abdominal   Peds negative pediatric ROS (+)  Hematology negative hematology ROS (+)   Anesthesia Other Findings   Reproductive/Obstetrics negative OB ROS                          Anesthesia Physical Anesthesia Plan  ASA: II  Anesthesia Plan: MAC   Post-op Pain Management:    Induction:   Airway Management Planned:   Additional Equipment:   Intra-op Plan:   Post-operative Plan:   Informed Consent: I have reviewed the patients History and Physical, chart, labs and discussed the procedure including the risks, benefits and alternatives for the proposed anesthesia with the patient or authorized representative who has indicated his/her understanding and acceptance.   Dental advisory given  Plan Discussed with: CRNA  Anesthesia Plan Comments:        Anesthesia Quick Evaluation

## 2013-01-07 NOTE — H&P (Signed)
  HPI: This is a woman here for surveillance EUS of gastric lesion  2011 EUS: 15.27mm by 12.50mm round, discrete subepithelial lesion that may have increased slightly in size since last EUS 2 1/2 years ago (was 13.25mm by 13.63mm). This is either a leiomyoma or small GIST. At this size (<2cm) surgical resection is not necessary.    Past Medical History  Diagnosis Date  . Hypertension   . High triglycerides   . Hypercholesteremia   . Complication of anesthesia   . PONV (postoperative nausea and vomiting)   . Arthritis   . GERD (gastroesophageal reflux disease)   . Spinal stenosis   . Cancer     '82-left breast cancer-surgery only    Past Surgical History  Procedure Laterality Date  . Ovary surgery  1972-1973  . Breast surgery  1982  . Rotator cuff repair Left   . 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 Foot surgery  2009-2010    x2. Surgery in 2011  . Back surgery  01-01-13    lumbar disc surgery  . Mastectomy  1982    left  . Joint replacement      bil TKA    Current Facility-Administered Medications  Medication Dose Route Frequency Provider Last Rate Last Dose  . 0.9 %  sodium chloride infusion   Intravenous Continuous Rachael Fee, MD      . lactated ringers infusion   Intravenous Continuous Rachael Fee, MD        Allergies as of 12/31/2012  . (No Known Allergies)    History reviewed. No pertinent family history.  History   Social History  . Marital Status: Married    Spouse Name: N/A    Number of Children: N/A  . Years of Education: N/A   Occupational History  . Not on file.   Social History Main Topics  . Smoking status: Never Smoker   . Smokeless tobacco: Not on file  . Alcohol Use: Yes     Comment: Wine Occas.  . Drug Use: No  . Sexually Active: Yes   Other Topics Concern  . Not on file   Social History Narrative  . No narrative on file      Physical Exam: BP 125/66  Pulse 75  Temp(Src) 98.1 F (36.7 C) (Oral)  Resp 14  Ht  5\' 4"  (1.626 m)  Wt 140 lb (63.504 kg)  BMI 24.02 kg/m2  SpO2 95% Constitutional: generally well-appearing Psychiatric: alert and oriented x3 Abdomen: soft, nontender, nondistended, no obvious ascites, no peritoneal signs, normal bowel sounds     Assessment and plan: 72 y.o. female with known gastric lesion  Surveillance EUS today.  If no signficant growth, she will not likely need further surveillance

## 2013-01-07 NOTE — Transfer of Care (Signed)
Immediate Anesthesia Transfer of Care Note  Patient: Vicki Cervantes  Procedure(s) Performed: Procedure(s): UPPER ENDOSCOPIC ULTRASOUND (EUS) LINEAR (N/A)  Patient Location: PACU and Endoscopy Unit  Anesthesia Type:MAC  Level of Consciousness: awake, alert , oriented and patient cooperative  Airway & Oxygen Therapy: Patient Spontanous Breathing, Patient connected to nasal cannula oxygen and patient encouraged to cough.  O2 sat up to 97%  Post-op Assessment: Report given to PACU RN and Post -op Vital signs reviewed and stable  Post vital signs: Reviewed and stable  Complications: No apparent anesthesia complications

## 2013-01-11 ENCOUNTER — Encounter (HOSPITAL_COMMUNITY): Payer: Self-pay | Admitting: Gastroenterology

## 2013-06-17 ENCOUNTER — Ambulatory Visit (INDEPENDENT_AMBULATORY_CARE_PROVIDER_SITE_OTHER): Payer: MEDICARE | Admitting: Podiatrist

## 2013-06-17 ENCOUNTER — Encounter: Payer: Self-pay | Admitting: Podiatrist

## 2013-06-17 VITALS — BP 140/68 | HR 73 | Temp 98.0°F | Resp 16 | Ht 63.0 in | Wt 130.0 lb

## 2013-06-17 DIAGNOSIS — S90122A Contusion of left lesser toe(s) without damage to nail, initial encounter: Secondary | ICD-10-CM

## 2013-06-17 DIAGNOSIS — L02619 Cutaneous abscess of unspecified foot: Secondary | ICD-10-CM

## 2013-06-17 DIAGNOSIS — S90129A Contusion of unspecified lesser toe(s) without damage to nail, initial encounter: Secondary | ICD-10-CM

## 2013-06-17 MED ORDER — CEPHALEXIN 500 MG PO CAPS: 500.0000 mg | ORAL_CAPSULE | Freq: Three times a day (TID) | ORAL | Status: DC

## 2013-06-17 NOTE — Progress Notes (Signed)
   Subjective:    Patient ID: Vicki Cervantes, female    DOB: [birth year], 72 y.o.   MRN: [id number]  Miyuki presents today with concern over a large blister on the top of the left great toe. She states it started 2 days ago and it's become filled with fluid. She states it started as a very small blister but has gotten increasingly larger. She has not tried any homecare and she's not tried to pop the lesion. She does not know why it occurred.  HPI  N blister         L left dorsal 1st toe         D 2 days         O worsening         C large blister with redness surrounding         A began as a small blister         T shower and a bandaid     Review of Systems Negative per patient intake form and per patient    Objective:   Physical Exam GENERAL APPEARANCE: Alert, conversant. Appropriately groomed. No acute distress.  VASCULAR: Pedal pulses palpable and strong bilateral.  Capillary refill time is immediate to all digits,  Proximal to distal cooling it warm to warm.  Digital hair growth is present bilateral  NEUROLOGIC: sensation is intact epicritically and protectively to 5.07 monofilament at 5/5 sites bilateral.  Light touch is intact bilateral, vibratory sensation intact bilateral, achilles tendon reflex is intact bilateral.  MUSCULOSKELETAL: acceptable muscle strength, tone and stability bilateral.  Intrinsic muscluature intact bilateral.  Rectus appearance of foot and digits noted bilateral.   DERMATOLOGIC: A large bulla is located on the dorsal aspect of the left great toe. It appears to be filled with a sterile fluid as there is no sign of pus, or. Purulence    Assessment & Plan:  Bulla left great toe with contusion  Plan: I prepped the skin around the blister with iodine and lanced the blister with a sterile #15 blade. I drained the clear fluid from the blister and applied a compressive dressing. I instructed the patient on home care including use of gauze and bandage. Discussed the  skin was most likely slough off in time and this is normal. If she notices any redness, swelling, or any systemic sign of infection she will call.  Marlowe Aschoff DPM

## 2013-06-17 NOTE — Patient Instructions (Signed)
Soak your left foot in warm epsom salt water.  Wear a gauze wrap around your toe to absorb any drainage.  It should heal up and the light skin will slough off in about 3-7 days.  If you notice any redness, swelling or have any concerns, please call.

## 2013-08-09 ENCOUNTER — Ambulatory Visit
Admission: RE | Admit: 2013-08-09 | Discharge: 2013-08-09 | Disposition: A | Discharge status: Home / Self Care | Payer: Medicare Other | Source: Ambulatory Visit | Attending: Internal Medicine | Admitting: Internal Medicine

## 2013-08-09 ENCOUNTER — Other Ambulatory Visit: Payer: Self-pay | Admitting: Internal Medicine

## 2013-08-09 DIAGNOSIS — M545 Low back pain, unspecified: Secondary | ICD-10-CM

## 2014-01-17 ENCOUNTER — Ambulatory Visit (INDEPENDENT_AMBULATORY_CARE_PROVIDER_SITE_OTHER): Payer: MEDICARE | Admitting: Cardiology

## 2014-01-17 ENCOUNTER — Encounter (HOSPITAL_COMMUNITY): Payer: Self-pay | Admitting: *Deleted

## 2014-01-17 ENCOUNTER — Encounter: Payer: Self-pay | Admitting: Cardiology

## 2014-01-17 VITALS — BP 158/84 | HR 82 | Ht 62.5 in | Wt 138.2 lb

## 2014-01-17 DIAGNOSIS — E785 Hyperlipidemia, unspecified: Secondary | ICD-10-CM

## 2014-01-17 DIAGNOSIS — I10 Essential (primary) hypertension: Secondary | ICD-10-CM

## 2014-01-17 DIAGNOSIS — R079 Chest pain, unspecified: Secondary | ICD-10-CM

## 2014-01-17 NOTE — Patient Instructions (Signed)
We will schedule you for a nuclear stress test   

## 2014-01-18 NOTE — Progress Notes (Signed)
Vicki Cervantes Date of Birth: 1941-03-03 Medical Record #[id number]  History of Present Illness: Vicki Cervantes is a very pleasant 73 yo WF referred by Dr. Reynaldo Minium for evaluation of chest pain. She has a history of HTN and hyperlipidemia. She has no history of cardiac disease. She presents now with a 2 week history of pain in her lower anterior chest, sternum, and epigastric area. Her pain is aggravated by lying down and this typically brings on a sharp, severe pain. It is partially relieved with sitting up. The pain goes away but a tightness persists. Occasionally it will radiate to the back. No recent cough. Pain is nonexertional. She states it is sore to touch. She has been on antireflux therapy. 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 Recent abdominal ultrasound was negative for gallstones. 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 She has been under increased stress recently with the passing of her husband from cancer.     Medication List       This list is accurate as of: 01/17/14 11:59 PM.  Always use your most recent med list.               atorvastatin 20 MG tablet  Commonly known as:  LIPITOR  Take 20 mg by mouth every evening.     calcium-vitamin D 500-200 MG-UNIT per tablet  Take 1 tablet by mouth daily.     celecoxib 200 MG capsule  Commonly known as:  CELEBREX  Take 200 mg by mouth daily at 12 noon.     levothyroxine 100 MCG tablet  Commonly known as:  SYNTHROID, LEVOTHROID  Take 100 mcg by mouth every morning.     multivitamin with minerals Tabs tablet  Take 1 tablet by mouth daily.     pantoprazole 40 MG tablet  Commonly known as:  PROTONIX  Take 40 mg by mouth every morning.     raloxifene 60 MG tablet  Commonly known as:  EVISTA  Take 60 mg by mouth daily.     valsartan-hydrochlorothiazide 160-12.5 MG per tablet  Commonly known as:  DIOVAN-HCT  Take 1 tablet by mouth 2 (two) times  daily.        No Known Allergies  Past Medical History  Diagnosis Date  . Hypertension   . High triglycerides   . Hypercholesteremia   . Complication of anesthesia     nausea  . PONV (postoperative nausea and vomiting)   . Arthritis   . GERD (gastroesophageal reflux disease)   . Spinal stenosis   . Cancer     '82-left breast cancer-surgery only    Past Surgical History  Procedure Laterality Date  . Ovary surgery  1972-1973  . Breast surgery  1982  . Rotator cuff repair Left   . 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 Foot surgery  2009-2010    x2. Surgery in 2011  . Back surgery  01-01-13    lumbar disc surgery  . Mastectomy  1982    left  . Joint replacement      bil TKA  . Eus N/A 01/07/2013    Procedure: UPPER ENDOSCOPIC ULTRASOUND (EUS) LINEAR;  Surgeon: Milus Banister, MD;  Location: [facility];  Service: Endoscopy;  Laterality: N/A;    History   Social History  . Marital Status: Married    Spouse Name: N/A  Number of Children: 3  . Years of Education: N/A   Occupational History  . 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 Smoking status: Never Smoker   . Smokeless tobacco: None  . Alcohol Use: Yes     Comment: Wine Occas.  . Drug Use: No  . Sexual Activity: Yes   Other Topics Concern  . None   Social History Narrative  . None    Family History  Problem Relation Age of Onset  . Heart attack Father   . Heart attack Brother     Review of Systems: As noted in HPI.  All other systems were reviewed and are negative.  Physical Exam: BP 158/84  Pulse 82  Ht 5' 2.5" (1.588 m)  Wt 138 lb 3.2 oz (62.687 kg)  BMI 24.86 kg/m2 Filed Weights   01/17/14 1446  Weight: 138 lb 3.2 oz (62.687 kg)  GENERAL:  Well appearing WF in NAD HEENT:  PERRL, EOMI, sclera are clear. Oropharynx is clear. NECK:  No jugular venous distention, carotid upstroke brisk and symmetric, no bruits, no thyromegaly or adenopathy LUNGS:   Clear to auscultation bilaterally CHEST:  There is some tenderness to palpation along the lower sternum and Xiphoid process.  HEART:  RRR,  PMI not displaced or sustained,S1 and S2 within normal limits, no S3, no S4: no clicks, no rubs, no murmurs ABD:  Soft, nontender. BS +, no masses or bruits. No hepatomegaly, no splenomegaly EXT:  2 + pulses throughout, no edema, no cyanosis no clubbing SKIN:  Warm and dry.  No rashes NEURO:  Alert and oriented x 3. Cranial nerves II through XII intact. PSYCH:  Cognitively intact    LABORATORY DATA: CXR 02/26/13: NAD  Ecg: 01/17/14: NSR rate 68, normal.  Labs 02/19/13: Normal chemistry panel and CBC. Cholesterol- 178, Triglycerides- 187, HDL-54, LDL-87. TSH 0.09.  Assessment / Plan: 1. Atypical chest pain. Patient does have cardiac risk factors of HTN, hyperlipidemia, family history of CAD. Her symptoms are quite atypical. Abdominal ultrasound is negative for acute pathology. Based on prior EGD and the fact that she is on antireflux therapy I think this is unlikely to be GERD. She does have tenderness of the Xiphoid process that could be aggravated by supine position. I think it is important to rule out coronary ischemia given her risk factors. We will schedule her for a Leane Call on July 9. She has had bilateral knee replacements in the past and it was recommended by ortho not to walk on a treadmill. If stress test is negative would consider a trial of anti-inflammatory medication and rest. If positive, she would need cardiac cath.   2. HTN  3. Hyperlipidemia.

## 2014-01-19 ENCOUNTER — Telehealth (HOSPITAL_COMMUNITY): Payer: Self-pay

## 2014-01-19 NOTE — Telephone Encounter (Signed)
Encounter complete. 

## 2014-01-20 ENCOUNTER — Ambulatory Visit (HOSPITAL_COMMUNITY)
Admission: RE | Admit: 2014-01-20 | Discharge: 2014-01-20 | Disposition: A | Discharge status: Home / Self Care | Payer: MEDICARE | Source: Ambulatory Visit | Attending: Cardiology | Admitting: Cardiology

## 2014-01-20 DIAGNOSIS — R5383 Other fatigue: Secondary | ICD-10-CM

## 2014-01-20 DIAGNOSIS — R5381 Other malaise: Secondary | ICD-10-CM | POA: Insufficient documentation

## 2014-01-20 DIAGNOSIS — R002 Palpitations: Secondary | ICD-10-CM | POA: Insufficient documentation

## 2014-01-20 DIAGNOSIS — I10 Essential (primary) hypertension: Secondary | ICD-10-CM | POA: Insufficient documentation

## 2014-01-20 DIAGNOSIS — R079 Chest pain, unspecified: Secondary | ICD-10-CM | POA: Insufficient documentation

## 2014-01-20 DIAGNOSIS — R42 Dizziness and giddiness: Secondary | ICD-10-CM | POA: Insufficient documentation

## 2014-01-20 DIAGNOSIS — E785 Hyperlipidemia, unspecified: Secondary | ICD-10-CM

## 2014-01-20 DIAGNOSIS — Z8249 Family history of ischemic heart disease and other diseases of the circulatory system: Secondary | ICD-10-CM | POA: Insufficient documentation

## 2014-01-20 MED ORDER — TECHNETIUM TC 99M SESTAMIBI GENERIC - CARDIOLITE
9.9000 | Freq: Once | INTRAVENOUS | Status: AC | PRN
  Administered 2014-01-20: 10 via INTRAVENOUS

## 2014-01-20 MED ORDER — REGADENOSON 0.4 MG/5ML IV SOLN
0.4000 mg | Freq: Once | INTRAVENOUS | Status: AC
  Administered 2014-01-20: 0.4 mg via INTRAVENOUS

## 2014-01-20 MED ORDER — AMINOPHYLLINE 25 MG/ML IV SOLN
75.0000 mg | Freq: Once | INTRAVENOUS | Status: AC
  Administered 2014-01-20: 75 mg via INTRAVENOUS

## 2014-01-20 MED ORDER — TECHNETIUM TC 99M SESTAMIBI GENERIC - CARDIOLITE
29.9000 | Freq: Once | INTRAVENOUS | Status: AC | PRN
Start: 2014-01-20 — End: 2014-01-20
  Administered 2014-01-20: 29.9 via INTRAVENOUS

## 2014-01-20 NOTE — Procedures (Addendum)
[facility] [address] [place] [address] [phone]  Cardiology Nuclear Med Study  Vicki Cervantes is a 73 y.o. female     MRN : [id number]     DOB: 06/11/1941  Procedure Date: 01/20/2014  Nuclear Med Background Indication for Stress Test:  Evaluation for Ischemia History:  No prior cardiac or respiratory history reported; No prior NUC MPI for comparison; Cardiac Risk Factors: Family History - CAD, Hypertension and Lipids  Symptoms:  Chest Pain, Fatigue, Light-Headedness and Palpitations   Nuclear Pre-Procedure Caffeine/Decaff Intake:  1:00am NPO After: 11am   IV Site: R Forearm  IV 0.9% NS with Angio Cath:  22g  Chest Size (in):  n/a IV Started by: Rolene Course, RN  Height: 5' 2.5" (1.588 m)  Cup Size: B--Bilateral Breast implants s/p Radical left mastectomy and simple Right mastectomy;No sticks left side.  BMI:  Body mass index is 24.82 kg/(m^2). Weight:  138 lb (62.596 kg)   Tech Comments:  NO sticks Left arm    Nuclear Med Study 1 or 2 day study: 1 day  Stress Test Type:  [place] Provider:  Peter Martinique, MD   Resting Radionuclide: Technetium 68m Sestamibi  Resting Radionuclide Dose: 9.9 mCi   Stress Radionuclide:  Technetium 31m Sestamibi  Stress Radionuclide Dose: 29.9 mCi           Stress Protocol Rest HR: 68 Stress HR:111  Rest BP: 144/83 Stress BP: 160/73  Exercise Time (min): n/a METS: n/a          Dose of Adenosine (mg):  n/a Dose of Lexiscan: 0.4 mg  Dose of Atropine (mg): n/a Dose of Dobutamine: n/a mcg/kg/min (at max HR)  Stress Test Technologist: Mellody Memos, CCT Nuclear Technologist: Imagene Riches, CNMT   Rest Procedure:  Myocardial perfusion imaging was performed at rest 45 minutes following the intravenous administration of Technetium 77m Sestamibi. Stress Procedure:  The patient received IV Lexiscan 0.4 mg over 15-seconds.  Technetium 46m Sestamibi injected Iv at  30-seconds.  Patient experienced shortness of breath and was administered 75 mg of Aminophylline IV at 5 minutes. There were no significant changes with Lexiscan.  Quantitative spect images were obtained after a 45 minute delay.  Transient Ischemic Dilatation (Normal <1.22):  1.52  QGS EDV:  50 ml QGS ESV:  19 ml LV Ejection Fraction: 62%  Rest ECG: NSR - Normal EKG  Stress ECG: No significant change from baseline ECG  QPS Raw Data Images:  Normal; no motion artifact; normal heart/lung ratio. Stress Images:  There is decreased uptake in the anterior wall. Rest Images:  Normal homogeneous uptake in all areas of the myocardium. Subtraction (SDS):  No evidence of ischemia.  Impression Exercise Capacity:  Lexiscan with no exercise. BP Response:  Normal blood pressure response. Clinical Symptoms:  There is dyspnea. ECG Impression:  No significant ECG changes with Lexiscan. Comparison with Prior Nuclear Study: No previous nuclear study performed  Overall Impression:  High risk stress nuclear study with moderate-sized, reversible anterior ischemia - SDS 10. Extent 13%.  LV Wall Motion:  NL LV Function; NL Wall Motion; 62%  [name], MD, [place] Certified in Nuclear Cardiology Attending Cardiologist [place], MD  01/20/2014 4:45 PM

## 2014-01-21 ENCOUNTER — Telehealth: Payer: Self-pay

## 2014-01-21 ENCOUNTER — Other Ambulatory Visit: Payer: Self-pay

## 2014-01-21 ENCOUNTER — Encounter (HOSPITAL_COMMUNITY): Payer: Self-pay

## 2014-01-21 ENCOUNTER — Ambulatory Visit
Admission: RE | Admit: 2014-01-21 | Discharge: 2014-01-21 | Disposition: A | Discharge status: Home / Self Care | Payer: MEDICARE | Source: Ambulatory Visit | Attending: Cardiology | Admitting: Cardiology

## 2014-01-21 ENCOUNTER — Other Ambulatory Visit: Payer: Self-pay | Admitting: Cardiology

## 2014-01-21 ENCOUNTER — Telehealth: Payer: Self-pay | Admitting: Cardiology

## 2014-01-21 DIAGNOSIS — R079 Chest pain, unspecified: Secondary | ICD-10-CM

## 2014-01-21 DIAGNOSIS — R9439 Abnormal result of other cardiovascular function study: Secondary | ICD-10-CM

## 2014-01-21 DIAGNOSIS — I10 Essential (primary) hypertension: Secondary | ICD-10-CM

## 2014-01-21 NOTE — Telephone Encounter (Signed)
Returning your call. °

## 2014-01-21 NOTE — Telephone Encounter (Signed)
Received call from patient myoview results given.Cardiac cath scheduled 01/24/14.Advised to come to office today 01/21/14 to pick up cath instructions and have lab work and chest xray.

## 2014-01-21 NOTE — Telephone Encounter (Signed)
Patient called no answer.Left message on personal voice mail to call me back this morning about myoview.

## 2014-01-21 NOTE — Telephone Encounter (Signed)
Returned call to patient no answer.LMTC. 

## 2014-01-21 NOTE — Telephone Encounter (Signed)
Patient never came to office to pick up cath instructions as planned.Patient called stated she came by office and front desk person sent her down stairs to [employer].Cath instructions given to patient over the phone she verbalized understanding.Patient advised to go to [facility] on [address] this afternoon for a cxr.Follow up cath appointment scheduled with Truitt Merle NP at [facility] office Tue 02/22/14.

## 2014-01-22 LAB — CBC WITH DIFFERENTIAL/PLATELET
BASOS ABS: 0.1 10*3/uL (ref 0.0–0.1)
Basophils Relative: 1 % (ref 0–1)
EOS PCT: 4 % (ref 0–5)
Eosinophils Absolute: 0.3 10*3/uL (ref 0.0–0.7)
HEMATOCRIT: 36.2 % (ref 36.0–46.0)
HEMOGLOBIN: 12.6 g/dL (ref 12.0–15.0)
LYMPHS PCT: 25 % (ref 12–46)
Lymphs Abs: 1.6 10*3/uL (ref 0.7–4.0)
MCH: 29.9 pg (ref 26.0–34.0)
MCHC: 34.8 g/dL (ref 30.0–36.0)
MCV: 86 fL (ref 78.0–100.0)
MONO ABS: 0.3 10*3/uL (ref 0.1–1.0)
MONOS PCT: 5 % (ref 3–12)
NEUTROS ABS: 4.2 10*3/uL (ref 1.7–7.7)
Neutrophils Relative %: 65 % (ref 43–77)
Platelets: 236 10*3/uL (ref 150–400)
RBC: 4.21 MIL/uL (ref 3.87–5.11)
RDW: 13.1 % (ref 11.5–15.5)
WBC: 6.4 10*3/uL (ref 4.0–10.5)

## 2014-01-22 LAB — BASIC METABOLIC PANEL
BUN: 26 mg/dL — ABNORMAL HIGH (ref 6–23)
CO2: 26 meq/L (ref 19–32)
Calcium: 9.5 mg/dL (ref 8.4–10.5)
Chloride: 102 mEq/L (ref 96–112)
Creat: 1.29 mg/dL — ABNORMAL HIGH (ref 0.50–1.10)
GLUCOSE: 100 mg/dL — AB (ref 70–99)
POTASSIUM: 3.9 meq/L (ref 3.5–5.3)
SODIUM: 140 meq/L (ref 135–145)

## 2014-01-22 LAB — PROTIME-INR
INR: 1.06 (ref <1.50)
Prothrombin Time: 13.8 seconds (ref 11.6–15.2)

## 2014-01-24 ENCOUNTER — Encounter (HOSPITAL_COMMUNITY)
Admission: RE | Disposition: A | Discharge status: Home / Self Care | Payer: Self-pay | Source: Ambulatory Visit | Attending: Cardiology

## 2014-01-24 ENCOUNTER — Ambulatory Visit (HOSPITAL_COMMUNITY)
Admission: RE | Admit: 2014-01-24 | Discharge: 2014-01-24 | Disposition: A | Discharge status: Home / Self Care | Payer: MEDICARE | Source: Ambulatory Visit | Attending: Cardiology | Admitting: Cardiology

## 2014-01-24 ENCOUNTER — Telehealth: Payer: Self-pay | Admitting: Cardiology

## 2014-01-24 DIAGNOSIS — R9439 Abnormal result of other cardiovascular function study: Secondary | ICD-10-CM | POA: Insufficient documentation

## 2014-01-24 DIAGNOSIS — R0789 Other chest pain: Secondary | ICD-10-CM | POA: Diagnosis not present

## 2014-01-24 DIAGNOSIS — Z9010 Acquired absence of unspecified breast and nipple: Secondary | ICD-10-CM | POA: Insufficient documentation

## 2014-01-24 DIAGNOSIS — I10 Essential (primary) hypertension: Secondary | ICD-10-CM | POA: Insufficient documentation

## 2014-01-24 DIAGNOSIS — Z853 Personal history of malignant neoplasm of breast: Secondary | ICD-10-CM | POA: Insufficient documentation

## 2014-01-24 DIAGNOSIS — Z96659 Presence of unspecified artificial knee joint: Secondary | ICD-10-CM | POA: Insufficient documentation

## 2014-01-24 DIAGNOSIS — E785 Hyperlipidemia, unspecified: Secondary | ICD-10-CM | POA: Diagnosis not present

## 2014-01-24 DIAGNOSIS — K219 Gastro-esophageal reflux disease without esophagitis: Secondary | ICD-10-CM | POA: Insufficient documentation

## 2014-01-24 DIAGNOSIS — M129 Arthropathy, unspecified: Secondary | ICD-10-CM | POA: Insufficient documentation

## 2014-01-24 DIAGNOSIS — E7800 Pure hypercholesterolemia, unspecified: Secondary | ICD-10-CM | POA: Insufficient documentation

## 2014-01-24 DIAGNOSIS — Z8249 Family history of ischemic heart disease and other diseases of the circulatory system: Secondary | ICD-10-CM | POA: Insufficient documentation

## 2014-01-24 DIAGNOSIS — K7689 Other specified diseases of liver: Secondary | ICD-10-CM | POA: Insufficient documentation

## 2014-01-24 DIAGNOSIS — D175 Benign lipomatous neoplasm of intra-abdominal organs: Secondary | ICD-10-CM | POA: Insufficient documentation

## 2014-01-24 DIAGNOSIS — R079 Chest pain, unspecified: Secondary | ICD-10-CM

## 2014-01-24 HISTORY — PX: LEFT HEART CATHETERIZATION WITH CORONARY ANGIOGRAM: SHX5451

## 2014-01-24 SURGERY — LEFT HEART CATHETERIZATION WITH CORONARY ANGIOGRAM
Anesthesia: LOCAL

## 2014-01-24 MED ORDER — ASPIRIN 81 MG PO CHEW
81.0000 mg | CHEWABLE_TABLET | ORAL | Status: AC
  Administered 2014-01-24: 81 mg via ORAL

## 2014-01-24 MED ORDER — HEPARIN SODIUM (PORCINE) 1000 UNIT/ML IJ SOLN
INTRAMUSCULAR | Status: AC
  Filled 2014-01-24: qty 1

## 2014-01-24 MED ORDER — SODIUM CHLORIDE 0.9 % IV SOLN
INTRAVENOUS | Status: DC
  Administered 2014-01-24: 09:00:00 via INTRAVENOUS

## 2014-01-24 MED ORDER — ASPIRIN 81 MG PO CHEW
CHEWABLE_TABLET | ORAL | Status: DC
Start: 2014-01-24 — End: 2014-01-24
  Filled 2014-01-24: qty 1

## 2014-01-24 MED ORDER — SODIUM CHLORIDE 0.9 % IV SOLN: 250.0000 mL | INTRAVENOUS | Status: DC | PRN

## 2014-01-24 MED ORDER — SODIUM CHLORIDE 0.9 % IV SOLN: INTRAVENOUS | Status: DC

## 2014-01-24 MED ORDER — MIDAZOLAM HCL 2 MG/2ML IJ SOLN
INTRAMUSCULAR | Status: AC
  Filled 2014-01-24: qty 2

## 2014-01-24 MED ORDER — FENTANYL CITRATE 0.05 MG/ML IJ SOLN
INTRAMUSCULAR | Status: AC
  Filled 2014-01-24: qty 2

## 2014-01-24 MED ORDER — SODIUM CHLORIDE 0.9 % IJ SOLN: 3.0000 mL | Freq: Two times a day (BID) | INTRAMUSCULAR | Status: DC

## 2014-01-24 MED ORDER — SODIUM CHLORIDE 0.9 % IJ SOLN: 3.0000 mL | INTRAMUSCULAR | Status: DC | PRN

## 2014-01-24 MED ORDER — HYDRALAZINE HCL 20 MG/ML IJ SOLN
10.0000 mg | Freq: Once | INTRAMUSCULAR | Status: AC
  Administered 2014-01-24: 10 mg via INTRAVENOUS

## 2014-01-24 MED ORDER — HYDRALAZINE HCL 20 MG/ML IJ SOLN
INTRAMUSCULAR | Status: AC
  Filled 2014-01-24: qty 1

## 2014-01-24 NOTE — CV Procedure (Signed)
    Cardiac Catheterization Procedure Note  Name: Vicki Cervantes MRN: [id number] DOB: 09-13-1940  Procedure: Left Heart Cath, Selective Coronary Angiography, LV angiography  Indication: 73 yo WF with history of HTN and hyperlipidemia presents with atypical chest pain. Myoview study is abnormal with anteroapical ischemia.   Procedural Details: The right wrist was prepped, draped, and anesthetized with 1% lidocaine. Using the modified Seldinger technique, a 6 French slender sheath was introduced into the right radial artery. 3 mg of verapamil was administered through the sheath, weight-based unfractionated heparin was administered intravenously. Standard Judkins catheters were used for selective coronary angiography and left ventriculography. Catheter exchanges were performed over an exchange length guidewire. There were no immediate procedural complications. A TR band was used for radial hemostasis at the completion of the procedure.  The patient was transferred to the post catheterization recovery area for further monitoring.  Procedural Findings: Hemodynamics: AO 135/63 mean 97 mm Hg LV 133/9 mm Hg  Coronary angiography: Coronary dominance: right  Left mainstem: Normal   Left anterior descending (LAD): There is focal calcification in the proximal vessel without obstruction. Otherwise normal.  Left circumflex (LCx): Normal  Right coronary artery (RCA): Normal.  Left ventriculography: Left ventricular systolic function is normal, LVEF is estimated at 55-65%, there is no significant mitral regurgitation   Final Conclusions:   1. Normal coronary anatomy 2. Normal LV function.  Recommendations: Medical management  Shermaine Brigham Martinique, [place]  01/24/2014, 1:27 PM

## 2014-01-24 NOTE — Discharge Instructions (Signed)
Radial Site Care °Refer to this sheet in the next few weeks. These instructions provide you with information on caring for yourself after your procedure. Your caregiver may also give you more specific instructions. Your treatment has been planned according to current medical practices, but problems sometimes occur. Call your caregiver if you have any problems or questions after your procedure. °HOME CARE INSTRUCTIONS °· You may shower the day after the procedure. Remove the bandage (dressing) and gently wash the site with plain soap and water. Gently pat the site dry. °· Do not apply powder or lotion to the site. °· Do not submerge the affected site in water for 3 to 5 days. °· Inspect the site at least twice daily. °· Do not flex or bend the affected arm for 24 hours. °· No lifting over 5 pounds (2.3 kg) for 5 days after your procedure. °· Do not drive home if you are discharged the same day of the procedure. Have someone else drive you. °· You may drive 24 hours after the procedure unless otherwise instructed by your caregiver. °· Do not operate machinery or power tools for 24 hours. °· A responsible adult should be with you for the first 24 hours after you arrive home. °What to expect: °· Any bruising will usually fade within 1 to 2 weeks. °· Blood that collects in the tissue (hematoma) may be painful to the touch. It should usually decrease in size and tenderness within 1 to 2 weeks. °SEEK IMMEDIATE MEDICAL CARE IF: °· You have unusual pain at the radial site. °· You have redness, warmth, swelling, or pain at the radial site. °· You have drainage (other than a small amount of blood on the dressing). °· You have chills. °· You have a fever or persistent symptoms for more than 72 hours. °· You have a fever and your symptoms suddenly get worse. °· Your arm becomes pale, cool, tingly, or numb. °· You have heavy bleeding from the site. Hold pressure on the site. °Document Released: 08/03/2010 Document Revised:  09/23/2011 Document Reviewed: 08/03/2010 °ExitCare® Patient Information ©2015 [employer]. This information is not intended to replace advice given to you by your health care provider. Make sure you discuss any questions you have with your health care provider. ° °

## 2014-01-24 NOTE — Interval H&P Note (Signed)
History and Physical Interval Note:  01/24/2014 12:52 PM  Vicki Cervantes  has presented today for surgery, with the diagnosis of abnormal myoview  The various methods of treatment have been discussed with the patient and family. After consideration of risks, benefits and other options for treatment, the patient has consented to  Procedure(s): LEFT HEART CATHETERIZATION WITH CORONARY ANGIOGRAM (N/A) as a surgical intervention .  The patient's history has been reviewed, patient examined, no change in status, stable for surgery.  I have reviewed the patient's chart and labs.  Questions were answered to the patient's satisfaction.   Cath Lab Visit (complete for each Cath Lab visit)  Clinical Evaluation Leading to the Procedure:   ACS: No.  Non-ACS:    Anginal Classification: CCS III  Anti-ischemic medical therapy: Minimal Therapy (1 class of medications)  Non-Invasive Test Results: Intermediate-risk stress test findings: cardiac mortality 1-3%/year  Prior CABG: No previous CABG        [name] [facility] 01/24/2014 12:52 PM

## 2014-01-24 NOTE — Telephone Encounter (Signed)
Returned call to patient she stated she had cardiac cath today.Stated she was doing good,wanting to know about appointment tomorrow.Advised appointment 02/22/14 at 8:30 am with Truitt Merle NP at our [facility].

## 2014-01-24 NOTE — H&P (View-Only) (Signed)
Vicki Cervantes Date of Birth: Jul 02, 1941 Medical Record #[id number]  History of Present Illness: Vicki Cervantes is a very pleasant 73 yo WF referred by Dr. Reynaldo Minium for evaluation of chest pain. She has a history of HTN and hyperlipidemia. She has no history of cardiac disease. She presents now with a 2 week history of pain in her lower anterior chest, sternum, and epigastric area. Her pain is aggravated by lying down and this typically brings on a sharp, severe pain. It is partially relieved with sitting up. The pain goes away but a tightness persists. Occasionally it will radiate to the back. No recent cough. Pain is nonexertional. She states it is sore to touch. She has been on antireflux therapy. 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 Recent abdominal ultrasound was negative for gallstones. 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 She has been under increased stress recently with the passing of her husband from cancer.     Medication List       This list is accurate as of: 01/17/14 11:59 PM.  Always use your most recent med list.               atorvastatin 20 MG tablet  Commonly known as:  LIPITOR  Take 20 mg by mouth every evening.     calcium-vitamin D 500-200 MG-UNIT per tablet  Take 1 tablet by mouth daily.     celecoxib 200 MG capsule  Commonly known as:  CELEBREX  Take 200 mg by mouth daily at 12 noon.     levothyroxine 100 MCG tablet  Commonly known as:  SYNTHROID, LEVOTHROID  Take 100 mcg by mouth every morning.     multivitamin with minerals Tabs tablet  Take 1 tablet by mouth daily.     pantoprazole 40 MG tablet  Commonly known as:  PROTONIX  Take 40 mg by mouth every morning.     raloxifene 60 MG tablet  Commonly known as:  EVISTA  Take 60 mg by mouth daily.     valsartan-hydrochlorothiazide 160-12.5 MG per tablet  Commonly known as:  DIOVAN-HCT  Take 1 tablet by mouth 2 (two) times  daily.        No Known Allergies  Past Medical History  Diagnosis Date  . Hypertension   . High triglycerides   . Hypercholesteremia   . Complication of anesthesia     nausea  . PONV (postoperative nausea and vomiting)   . Arthritis   . GERD (gastroesophageal reflux disease)   . Spinal stenosis   . Cancer     '82-left breast cancer-surgery only    Past Surgical History  Procedure Laterality Date  . Ovary surgery  1972-1973  . Breast surgery  1982  . Rotator cuff repair Left   . 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 Foot surgery  2009-2010    x2. Surgery in 2011  . Back surgery  01-01-13    lumbar disc surgery  . Mastectomy  1982    left  . Joint replacement      bil TKA  . Eus N/A 01/07/2013    Procedure: UPPER ENDOSCOPIC ULTRASOUND (EUS) LINEAR;  Surgeon: Milus Banister, MD;  Location: [facility];  Service: Endoscopy;  Laterality: N/A;    History   Social History  . Marital Status: Married    Spouse Name: N/A  Number of Children: 3  . Years of Education: N/A   Occupational History  . 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 Smoking status: Never Smoker   . Smokeless tobacco: None  . Alcohol Use: Yes     Comment: Wine Occas.  . Drug Use: No  . Sexual Activity: Yes   Other Topics Concern  . None   Social History Narrative  . None    Family History  Problem Relation Age of Onset  . Heart attack Father   . Heart attack Brother     Review of Systems: As noted in HPI.  All other systems were reviewed and are negative.  Physical Exam: BP 158/84  Pulse 82  Ht 5' 2.5" (1.588 m)  Wt 138 lb 3.2 oz (62.687 kg)  BMI 24.86 kg/m2 Filed Weights   01/17/14 1446  Weight: 138 lb 3.2 oz (62.687 kg)  GENERAL:  Well appearing WF in NAD HEENT:  PERRL, EOMI, sclera are clear. Oropharynx is clear. NECK:  No jugular venous distention, carotid upstroke brisk and symmetric, no bruits, no thyromegaly or adenopathy LUNGS:   Clear to auscultation bilaterally CHEST:  There is some tenderness to palpation along the lower sternum and Xiphoid process.  HEART:  RRR,  PMI not displaced or sustained,S1 and S2 within normal limits, no S3, no S4: no clicks, no rubs, no murmurs ABD:  Soft, nontender. BS +, no masses or bruits. No hepatomegaly, no splenomegaly EXT:  2 + pulses throughout, no edema, no cyanosis no clubbing SKIN:  Warm and dry.  No rashes NEURO:  Alert and oriented x 3. Cranial nerves II through XII intact. PSYCH:  Cognitively intact    LABORATORY DATA: CXR 02/26/13: NAD  Ecg: 01/17/14: NSR rate 68, normal.  Labs 02/19/13: Normal chemistry panel and CBC. Cholesterol- 178, Triglycerides- 187, HDL-54, LDL-87. TSH 0.09.  Assessment / Plan: 1. Atypical chest pain. Patient does have cardiac risk factors of HTN, hyperlipidemia, family history of CAD. Her symptoms are quite atypical. Abdominal ultrasound is negative for acute pathology. Based on prior EGD and the fact that she is on antireflux therapy I think this is unlikely to be GERD. She does have tenderness of the Xiphoid process that could be aggravated by supine position. I think it is important to rule out coronary ischemia given her risk factors. We will schedule her for a Leane Call on July 9. She has had bilateral knee replacements in the past and it was recommended by ortho not to walk on a treadmill. If stress test is negative would consider a trial of anti-inflammatory medication and rest. If positive, she would need cardiac cath.   2. HTN  3. Hyperlipidemia.

## 2014-01-24 NOTE — Telephone Encounter (Signed)
Please call,concerning her coming in tomorrow morning.

## 2014-02-21 ENCOUNTER — Encounter: Payer: Self-pay | Admitting: Internal Medicine

## 2014-02-22 ENCOUNTER — Ambulatory Visit: Payer: MEDICARE | Admitting: Nurse Practitioner

## 2014-03-25 ENCOUNTER — Ambulatory Visit: Payer: MEDICARE | Admitting: Nurse Practitioner

## 2014-03-28 ENCOUNTER — Encounter: Payer: Self-pay | Admitting: Internal Medicine

## 2014-03-29 ENCOUNTER — Ambulatory Visit: Payer: MEDICARE | Attending: Internal Medicine | Admitting: Rehabilitative and Restorative Service Providers"

## 2014-03-29 DIAGNOSIS — M25559 Pain in unspecified hip: Secondary | ICD-10-CM | POA: Diagnosis not present

## 2014-03-29 DIAGNOSIS — Z96659 Presence of unspecified artificial knee joint: Secondary | ICD-10-CM | POA: Insufficient documentation

## 2014-03-29 DIAGNOSIS — M6281 Muscle weakness (generalized): Secondary | ICD-10-CM | POA: Insufficient documentation

## 2014-03-29 DIAGNOSIS — R269 Unspecified abnormalities of gait and mobility: Secondary | ICD-10-CM | POA: Diagnosis not present

## 2014-03-29 DIAGNOSIS — Z5189 Encounter for other specified aftercare: Secondary | ICD-10-CM | POA: Insufficient documentation

## 2014-04-01 ENCOUNTER — Ambulatory Visit: Payer: MEDICARE | Admitting: Rehabilitative and Restorative Service Providers"

## 2014-04-01 DIAGNOSIS — Z5189 Encounter for other specified aftercare: Secondary | ICD-10-CM | POA: Diagnosis not present

## 2014-04-06 ENCOUNTER — Ambulatory Visit: Payer: MEDICARE | Admitting: Rehabilitative and Restorative Service Providers"

## 2014-04-06 ENCOUNTER — Other Ambulatory Visit: Payer: Self-pay | Admitting: Internal Medicine

## 2014-04-06 DIAGNOSIS — Z5189 Encounter for other specified aftercare: Secondary | ICD-10-CM | POA: Diagnosis not present

## 2014-04-06 DIAGNOSIS — M48061 Spinal stenosis, lumbar region without neurogenic claudication: Secondary | ICD-10-CM

## 2014-04-08 ENCOUNTER — Encounter: Payer: MEDICARE | Admitting: Rehabilitative and Restorative Service Providers"

## 2014-04-11 ENCOUNTER — Ambulatory Visit: Payer: MEDICARE | Admitting: Rehabilitative and Restorative Service Providers"

## 2014-04-11 DIAGNOSIS — Z5189 Encounter for other specified aftercare: Secondary | ICD-10-CM | POA: Diagnosis not present

## 2014-04-12 ENCOUNTER — Other Ambulatory Visit: Payer: Self-pay | Admitting: Internal Medicine

## 2014-04-12 ENCOUNTER — Ambulatory Visit
Admission: RE | Admit: 2014-04-12 | Discharge: 2014-04-12 | Disposition: A | Discharge status: Home / Self Care | Payer: MEDICARE | Source: Ambulatory Visit | Attending: Internal Medicine | Admitting: Internal Medicine

## 2014-04-12 DIAGNOSIS — M48061 Spinal stenosis, lumbar region without neurogenic claudication: Secondary | ICD-10-CM

## 2014-04-12 DIAGNOSIS — M5126 Other intervertebral disc displacement, lumbar region: Secondary | ICD-10-CM

## 2014-04-12 MED ORDER — TRIAMCINOLONE ACETONIDE 40 MG/ML IJ SUSP (RADIOLOGY)
40.0000 mg | Freq: Once | INTRAMUSCULAR | Status: AC
  Administered 2014-04-12: 40 mg

## 2014-04-12 NOTE — Discharge Instructions (Signed)

## 2014-04-13 ENCOUNTER — Ambulatory Visit (INDEPENDENT_AMBULATORY_CARE_PROVIDER_SITE_OTHER): Payer: MEDICARE | Admitting: Nurse Practitioner

## 2014-04-13 ENCOUNTER — Encounter: Payer: Self-pay | Admitting: Nurse Practitioner

## 2014-04-13 VITALS — BP 150/70 | HR 81 | Ht 62.75 in | Wt 141.0 lb

## 2014-04-13 DIAGNOSIS — Z9889 Other specified postprocedural states: Secondary | ICD-10-CM

## 2014-04-13 NOTE — Patient Instructions (Signed)
Stay on your current medicines  See Dr. Martinique back as needed  Follow up with Dr. Reynaldo Minium  Call the [place] office at [phone] if you have any questions, problems or concerns.

## 2014-04-13 NOTE — Progress Notes (Signed)
Vicki Cervantes Date of Birth: 11/26/1940 Medical Record #[id number]  History of Present Illness: Vicki Cervantes is seen back today for a post cath visit. Seen for Vicki Cervantes.  She has a history of HTN and hyperlipidemia. Does have a fatty liver. Evaluated recently for chest pain. Ended up getting a cardiac cath back in July - this was normal.  Comes back today. Here alone. Doing ok. Husband died 3 months ago. Just started back walking. She has had no more chest pain. Now on PPI BID and doing fine. She has not had her medicines today. BP typically great at home.   Current Outpatient Prescriptions  Medication Sig Dispense Refill  . aspirin EC 81 MG tablet Take 81 mg by mouth daily.      Marland Kitchen atorvastatin (LIPITOR) 20 MG tablet Take 20 mg by mouth every evening.       . Calcium Carbonate-Vitamin D (CALCIUM-VITAMIN D) 500-200 MG-UNIT per tablet Take 1 tablet by mouth daily.      . celecoxib (CELEBREX) 200 MG capsule Take 200 mg by mouth daily.       . citalopram (CELEXA) 20 MG tablet Take 20 mg by mouth daily.      Marland Kitchen levothyroxine (SYNTHROID, LEVOTHROID) 100 MCG tablet Take 100 mcg by mouth daily before breakfast.       . Multiple Vitamin (MULTIVITAMIN WITH MINERALS) TABS Take 1 tablet by mouth daily.      . pantoprazole (PROTONIX) 40 MG tablet Take 40 mg by mouth every morning.       . raloxifene (EVISTA) 60 MG tablet Take 60 mg by mouth daily.        . valsartan-hydrochlorothiazide (DIOVAN-HCT) 320-25 MG per tablet Take 1 tablet by mouth daily.       No current facility-administered medications for this visit.    No Known Allergies  Past Medical History  Diagnosis Date  . Hypertension   . High triglycerides   . Hypercholesteremia   . Complication of anesthesia     nausea  . PONV (postoperative nausea and vomiting)   . Arthritis   . GERD (gastroesophageal reflux disease)   . Spinal stenosis   . Cancer     '82-left breast cancer-surgery only    Past Surgical History  Procedure  Laterality Date  . Ovary surgery  1972-1973  . Breast surgery  1982  . Rotator cuff repair Left   . 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 Foot surgery  2009-2010    x2. Surgery in 2011  . Back surgery  01-01-13    lumbar disc surgery  . Mastectomy  1982    left  . Joint replacement      bil TKA  . Eus N/A 01/07/2013    Procedure: UPPER ENDOSCOPIC ULTRASOUND (EUS) LINEAR;  Surgeon: Vicki Banister, MD;  Location: [facility];  Service: Endoscopy;  Laterality: N/A;    History  Smoking status  . Never Smoker   Smokeless tobacco  . Not on file    History  Alcohol Use  . Yes    Comment: Wine Occas.    Family History  Problem Relation Age of Onset  . Heart attack Father   . Heart attack Brother     Review of Systems: The review of systems is per the HPI.  All other systems were reviewed and are negative.  Physical Exam: BP 150/70  Pulse 81  Ht 5' 2.75" (1.594 m)  Wt 141 lb (63.957  kg)  BMI 25.17 kg/m2  SpO2 96% Patient is very pleasant and in no acute distress. Skin is warm and dry. Color is normal.  HEENT is unremarkable. Normocephalic/atraumatic. PERRL. Sclera are nonicteric. Neck is supple. No masses. No JVD. Lungs are clear. Cardiac exam shows a regular rate and rhythm. Abdomen is soft. Extremities are without edema. Gait and ROM are intact. No gross neurologic deficits noted.  Wt Readings from Last 3 Encounters:  04/13/14 141 lb (63.957 kg)  01/24/14 132 lb (59.875 kg)  01/24/14 132 lb (59.875 kg)    LABORATORY DATA/PROCEDURES:  Lab Results  Component Value Date   WBC 6.4 01/21/2014   HGB 12.6 01/21/2014   HCT 36.2 01/21/2014   PLT 236 01/21/2014   GLUCOSE 100* 01/21/2014   ALT 22 11/14/2008   AST 25 11/14/2008   NA 140 01/21/2014   K 3.9 01/21/2014   CL 102 01/21/2014   CREATININE 1.29* 01/21/2014   BUN 26* 01/21/2014   CO2 26 01/21/2014   INR 1.06 01/21/2014    BNP (last 3 results) No results found for this basename: PROBNP,  in the last 8760  hours  Myoview from July 2015 Overall Impression: High risk stress nuclear study with  moderate-sized, reversible anterior ischemia - SDS 10. Extent  13%.  LV Wall Motion: NL LV Function; NL Wall Motion; 62%  Vicki Casino, MD, [facility] Board Certified in Nuclear Cardiology Attending Cardiologist [place], MD  01/20/2014 4:45 PM   Cardiac Catheterization Procedure Note  Name: Vicki Cervantes  MRN: [id number]  DOB: October 18, 1940  Procedure: Left Heart Cath, Selective Coronary Angiography, LV angiography  Indication: 73 yo WF with history of HTN and hyperlipidemia presents with atypical chest pain. Myoview study is abnormal with anteroapical ischemia.  Procedural Details: The right wrist was prepped, draped, and anesthetized with 1% lidocaine. Using the modified Seldinger technique, a 6 French slender sheath was introduced into the right radial artery. 3 mg of verapamil was administered through the sheath, weight-based unfractionated heparin was administered intravenously. Standard Judkins catheters were used for selective coronary angiography and left ventriculography. Catheter exchanges were performed over an exchange length guidewire. There were no immediate procedural complications. A TR band was used for radial hemostasis at the completion of the procedure. The patient was transferred to the post catheterization recovery area for further monitoring.  Procedural Findings:  Hemodynamics:  AO 135/63 mean 97 mm Hg  LV 133/9 mm Hg  Coronary angiography:  Coronary dominance: right  Left mainstem: Normal  Left anterior descending (LAD): There is focal calcification in the proximal vessel without obstruction. Otherwise normal.  Left circumflex (LCx): Normal  Right coronary artery (RCA): Normal.  Left ventriculography: Left ventricular systolic function is normal, LVEF is estimated at 55-65%, there is no significant mitral regurgitation  Final Conclusions:  1. Normal  coronary anatomy  2. Normal LV function.  Recommendations: Medical management  Vicki Cervantes, [place]  01/24/2014, 1:27 PM    Assessment / Plan: 1. Chest pain - normal cath.  2. HTN -  No medicines today.   3. HLD  4. Situational stress - encouraged to get back to her exercise program.   CV risk factor modification encouraged. See back prn.   Patient is agreeable to this plan and will call if any problems develop in the interim.   Burtis Junes, RN, [place] [address] [place], [place]  [id number] [phone]

## 2014-04-14 ENCOUNTER — Ambulatory Visit: Payer: MEDICARE | Attending: Internal Medicine | Admitting: Rehabilitative and Restorative Service Providers"

## 2014-04-14 DIAGNOSIS — M6281 Muscle weakness (generalized): Secondary | ICD-10-CM | POA: Insufficient documentation

## 2014-04-14 DIAGNOSIS — R269 Unspecified abnormalities of gait and mobility: Secondary | ICD-10-CM | POA: Diagnosis not present

## 2014-04-19 ENCOUNTER — Encounter: Payer: MEDICARE | Admitting: Rehabilitative and Restorative Service Providers"

## 2014-04-27 ENCOUNTER — Ambulatory Visit: Payer: MEDICARE | Admitting: Internal Medicine

## 2014-05-24 ENCOUNTER — Ambulatory Visit: Payer: MEDICARE | Admitting: Internal Medicine

## 2014-05-30 ENCOUNTER — Ambulatory Visit: Payer: MEDICARE | Attending: Internal Medicine | Admitting: Rehabilitative and Restorative Service Providers"

## 2014-05-30 ENCOUNTER — Encounter: Payer: Self-pay | Admitting: Rehabilitative and Restorative Service Providers"

## 2014-05-30 DIAGNOSIS — R269 Unspecified abnormalities of gait and mobility: Secondary | ICD-10-CM | POA: Diagnosis present

## 2014-05-30 NOTE — Therapy (Signed)
Physical Therapy Treatment  Patient Details  Name: Vicki Cervantes MRN: [id number] Date of Birth: [birth year]  Encounter Date: 05/30/2014      PT End of Session - 05/30/14 1022    Visit Number 6   Number of Visits 6   Date for PT Re-Evaluation 05/30/14   PT Start Time 0935   PT Stop Time 1015   PT Time Calculation (min) 40 min   Activity Tolerance Patient tolerated treatment well      Past Medical History  Diagnosis Date  . Hypertension   . High triglycerides   . Hypercholesteremia   . Complication of anesthesia     nausea  . PONV (postoperative nausea and vomiting)   . Arthritis   . GERD (gastroesophageal reflux disease)   . Spinal stenosis   . Cancer     '82-left breast cancer-surgery only  . Osteoarthritis   . Hemorrhoids   . Hiatal hernia   . Esophageal stricture   . Diverticulosis     Past Surgical History  Procedure Laterality Date  . Ovary surgery  1972-1973  . Breast surgery  1982  . Rotator cuff repair Left   . 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 Foot surgery  2009-2010    x2. Surgery in 2011  . Back surgery  01-01-13    lumbar disc surgery  . Mastectomy  1982    left  . Joint replacement      bil TKA  . Eus N/A 01/07/2013    Procedure: UPPER ENDOSCOPIC ULTRASOUND (EUS) LINEAR;  Surgeon: Milus Banister, MD;  Location: [facility];  Service: Endoscopy;  Laterality: N/A;    There were no vitals taken for this visit.  Visit Diagnosis:  Abnormality of gait - Plan: PT plan of care cert/re-cert      Subjective Assessment - 05/30/14 0936    Symptoms Patient went on cruise and returned for last PT session today to check on HEP and modify as needed.   Currently in Pain? No/denies  L hip without pain since injection     THERAPEUTIC EXERCISE: Standing heel cord stretch, sidestepping for hip strengthening performed.  NEUROMUSCULAR RE-EDUCATION: Reviewed all HEP written and progressed foot position on partial heel/toe and speed of head  turns. D/c brandt daroff as no symptoms with sit<>bilateral sidelying and educated patient on when to return to that exercise if symptoms develop in the future.       PT Education - 05/30/14 1022    Education provided Yes   Education Details HEP: modified written HEP with partial heel toe more narrow and faster speed of head turns with partial heel/toe.  Also added sidestepping.   Person(s) Educated Patient   Methods Explanation;Demonstration   Comprehension Verbalized understanding;Returned demonstration           PT Long Term Goals - 05/30/14 1025    PT LONG TERM GOAL #1   Title The patient will verbalize understanding of community wellness opportunities.   Baseline The patient is working out at [employer] and doing HEP + walking.   Status Achieved   PT LONG TERM GOAL #2   Title The patient will maintain single limb stance x 10 seconds bilaterally.   Baseline Goal not tested..patient continuing to work on this task via [place].   Status Deferred   PT LONG TERM GOAL #3   Title The patient will return demo HEP for core strength, L hip stretching/strengthening and high level balance.   Baseline  Patient is compliant with HEP and progressing for further balance challenge.   Status Achieved   PT LONG TERM GOAL #4   Title The patient will verbalize understanding of pain management techniques for L hip.   Baseline This goal achieved through medical intervention of injection with no current pain to manage.  PT added hip strengthening for HEP as long as pt remains pain free with activity.   Status Achieved         Plan - 06/12/14 1023    Clinical Impression Statement The patient has met 2/4 long term goals.  She has HEP for post d/c, is working on single limb stance, is working out at [employer], and hip pain addressed through medical intervention of injection with no pain currently reported.   PT Treatment/Interventions --  d/c today   PT Next Visit Plan d/c today.   Recommended Other Services  none   Consulted and Agree with Plan of Care Patient          G-Codes - Jun 12, 2014 1028    Functional Limitation Self care   Self Care Goal Status [phone]) At least 1 percent but less than 20 percent impaired, limited or restricted   Self Care Discharge Status [phone]) At least 1 percent but less than 20 percent impaired, limited or restricted      Problem List Patient Active Problem List   Diagnosis Date Noted  . HTN (hypertension) 01/17/2014  . Chest pain at rest 01/17/2014  . Hyperlipidemia 04/03/2011  . OTHER SPECIFIED DISORDER OF STOMACH AND DUODENUM 10/19/2009     PHYSICAL THERAPY DISCHARGE SUMMARY  Visits from Start of Care: 6  Current functional level related to goals / functional outcomes: See above goals.   Remaining deficits: None per patient report.    Education / Equipment: HEP, community resources for exercise, fall prevention.   Plan: Patient agrees to discharge.  Patient goals were partially met. Patient is being discharged due to meeting the stated rehab goals.  ???  Thank you for the referral of this patient.                                          Arbor Leer 2014/06/12, 10:33 AM Rudell Cobb, MPT [place] [phone]

## 2014-05-30 NOTE — Patient Instructions (Signed)
   Side-Stepping   Walk to left side with eyes open. Take even steps, leading with same foot. Make sure each foot lifts off the floor. Repeat in opposite direction. Repeat for  10 steps to each side.  1 time per day. Can increase to doing 3-4 sets as long as pain does not return in hip.  Continue other PMH per written handouts.  Copyright  VHI. All rights reserved.

## 2014-06-01 ENCOUNTER — Ambulatory Visit: Payer: MEDICARE | Admitting: Internal Medicine

## 2014-06-06 ENCOUNTER — Ambulatory Visit: Payer: MEDICARE | Admitting: Rehabilitative and Restorative Service Providers"

## 2014-06-13 ENCOUNTER — Encounter: Payer: MEDICARE | Admitting: Rehabilitative and Restorative Service Providers"

## 2014-06-23 ENCOUNTER — Encounter (HOSPITAL_COMMUNITY): Payer: Self-pay | Admitting: Cardiology

## 2014-07-22 ENCOUNTER — Encounter: Payer: Self-pay | Admitting: Internal Medicine

## 2014-07-22 ENCOUNTER — Ambulatory Visit (INDEPENDENT_AMBULATORY_CARE_PROVIDER_SITE_OTHER): Payer: BLUE CROSS/BLUE SHIELD | Admitting: Internal Medicine

## 2014-07-22 VITALS — BP 134/80 | HR 76 | Ht 60.75 in | Wt 142.1 lb

## 2014-07-22 DIAGNOSIS — K222 Esophageal obstruction: Secondary | ICD-10-CM

## 2014-07-22 DIAGNOSIS — R0789 Other chest pain: Secondary | ICD-10-CM

## 2014-07-22 DIAGNOSIS — K219 Gastro-esophageal reflux disease without esophagitis: Secondary | ICD-10-CM

## 2014-07-22 DIAGNOSIS — D214 Benign neoplasm of connective and other soft tissue of abdomen: Secondary | ICD-10-CM

## 2014-07-22 NOTE — Progress Notes (Signed)
HISTORY OF PRESENT ILLNESS:  Vicki Cervantes is a 74 y.o. female with past medical history as listed below. She has been followed in this office for GERD complicated by peptic stricture, incidental submucosal gastric lesion being followed by Dr. Ardis Cervantes via endoscopic ultrasound, and colon cancer screening. The patient presents today regarding previous problems with noncardiac chest pain. She was evaluated by her cardiologist, Dr. Peter Cervantes, 01/18/2014 regarding a several week history of chest pain. Description of the chest pain from his note was "pain in her lower anterior chest, sternum, and epigastric area. Her pain is aggravated by lying down and typically brings on a sharp, severe pain. It is particularly with sitting up. The pain goes away but a tightness persists. Occasionally it will radiate into the back. No recent cough. Pain is nonexertional. She states it is sore to touch.". I read this description to the patient. She concurs. She does tell me, upon questioning, that was a slight pleuritic component. She underwent noninvasive cardiac testing which was equivocal. Subsequent cardiac catheterization revealed no significant coronary artery disease. She had increased her PPI to twice daily for a period time. The chest pain resolved shortly thereafter without recurrence. During her problems with chest pain she was having no active GI symptoms. She is currently taking once daily pantoprazole without reflux symptoms or recurrent dysphagia. Her last upper endoscopy with esophageal dilation was performed February 2008. Her last surveillance endoscopic ultrasound was performed June 2014 with follow-up recommended in 2 years. Her last colonoscopy was performed December 2009 with pandiverticulosis. Follow-up in 10 years recommended. Today she has no active GI complaints. She is leaving for [place] this afternoon  REVIEW OF SYSTEMS:  All non-GI ROS negative except for arthritis, back pain, sleeping  problems  Past Medical History  Diagnosis Date  . Hypertension   . High triglycerides   . Hypercholesteremia   . Complication of anesthesia     nausea  . PONV (postoperative nausea and vomiting)   . Arthritis   . GERD (gastroesophageal reflux disease)   . Spinal stenosis   . Breast cancer     NO STICK OR BLOOD PRESSURE CHECKS ON LEFT, '82-left breast cancer-surgery only  . Osteoarthritis   . Hemorrhoids   . Hiatal hernia   . Esophageal stricture   . Diverticulosis   . Hypothyroidism     Past Surgical History  Procedure Laterality Date  . Ovary surgery  1972-1973  . Mastectomy, radical Left 1982  . Rotator cuff repair Left   . Total knee arthroplasty Bilateral 2009-2010    x2  . Foot surgery Left 2009-2010    x2. Surgery in 2011  . Lumbar disc surgery  01-01-13  . Simple mastectomy Right 1982  . Eus N/A 01/07/2013    Procedure: UPPER ENDOSCOPIC ULTRASOUND (EUS) LINEAR;  Surgeon: Vicki Banister, MD;  Location: [facility];  Service: Endoscopy;  Laterality: N/A;  . Left heart catheterization with coronary angiogram N/A 01/24/2014    Procedure: LEFT HEART CATHETERIZATION WITH CORONARY ANGIOGRAM;  Surgeon: Vicki M Martinique, MD;  Location: [facility] CATH LAB;  Service: Cardiovascular;  Laterality: N/A;    Social History Vicki Cervantes  reports that she has never smoked. She has never used smokeless tobacco. She reports that she drinks alcohol. She reports that she does not use illicit drugs.  family history includes Heart attack in her brother and father; Heart disease in her mother.  No Known Allergies     PHYSICAL EXAMINATION: Vital signs: BP 134/80  mmHg  Pulse 76  Ht 5' 0.75" (1.543 m)  Wt 142 lb 2 oz (64.467 kg)  BMI 27.08 kg/m2  Constitutional: generally well-appearing, no acute distress Psychiatric: alert and oriented x3, cooperative Eyes: extraocular movements intact, anicteric, conjunctiva pink Mouth: oral pharynx moist, no lesions Neck: supple no  lymphadenopathy Cardiovascular: heart regular rate and rhythm, no murmur Lungs: clear to auscultation bilaterally Abdomen: soft, nontender, nondistended, no obvious ascites, no peritoneal signs, normal bowel sounds, no organomegaly Rectal: Omitted Extremities: no lower extremity edema bilaterally Skin: no lesions on visible extremities Neuro: No focal deficits.   ASSESSMENT:  #1. Transient problems with chest pain, without recurrence, as described. Negative cardiac workup. The features are not consistent with a GI etiology. They are consistent with musculoskeletal etiology (which is what Dr. Martinique suspected). #2. GERD with a history of peptic stricture status post dilation 2008. Asymptomatic post dilation on PPI #3. 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 Ardis Cervantes via EUS #5. Gen. medical care with Dr. Reynaldo Cervantes   PLAN:  #1. Continue PPI #2. Surveillance EUS with Dr. Ardis Cervantes around June 2016 #3. Surveillance colonoscopy to be considered around 2019. Interval GI follow-up as needed

## 2014-07-22 NOTE — Patient Instructions (Signed)
Please follow up with Dr. Perry as needed 

## 2014-10-14 DEATH — deceased

## 2014-12-14 ENCOUNTER — Other Ambulatory Visit: Payer: Self-pay | Admitting: Internal Medicine

## 2014-12-14 DIAGNOSIS — M7072 Other bursitis of hip, left hip: Secondary | ICD-10-CM

## 2014-12-15 ENCOUNTER — Ambulatory Visit
Admission: RE | Admit: 2014-12-15 | Discharge: 2014-12-15 | Disposition: A | Discharge status: Home / Self Care | Payer: BLUE CROSS/BLUE SHIELD | Source: Ambulatory Visit | Attending: Internal Medicine | Admitting: Internal Medicine

## 2014-12-15 DIAGNOSIS — M7072 Other bursitis of hip, left hip: Secondary | ICD-10-CM

## 2014-12-15 MED ORDER — IOHEXOL 180 MG/ML  SOLN
1.0000 mL | Freq: Once | INTRAMUSCULAR | Status: AC | PRN
  Administered 2014-12-15: 1 mL via INTRA_ARTICULAR

## 2014-12-15 MED ORDER — METHYLPREDNISOLONE ACETATE 40 MG/ML INJ SUSP (RADIOLOG
120.0000 mg | Freq: Once | INTRAMUSCULAR | Status: AC
  Administered 2014-12-15: 120 mg via INTRA_ARTICULAR

## 2014-12-20 ENCOUNTER — Telehealth: Payer: Self-pay

## 2014-12-20 DIAGNOSIS — K3189 Other diseases of stomach and duodenum: Secondary | ICD-10-CM

## 2014-12-21 NOTE — Telephone Encounter (Signed)
Pt has been scheduled for EUS on 01/05/15 1030 am instructions have been mailed to the pt and a message was left for the pt to get verbal instructions

## 2014-12-22 NOTE — Telephone Encounter (Signed)
EUS scheduled, pt instructed and medications reviewed.  Patient instructions mailed to home.  Patient to call with any questions or concerns.  

## 2014-12-30 ENCOUNTER — Encounter (HOSPITAL_COMMUNITY): Payer: Self-pay | Admitting: *Deleted

## 2015-01-05 ENCOUNTER — Encounter (HOSPITAL_COMMUNITY)
Admission: RE | Disposition: A | Discharge status: Home / Self Care | Payer: Self-pay | Source: Ambulatory Visit | Attending: Gastroenterology

## 2015-01-05 ENCOUNTER — Encounter (HOSPITAL_COMMUNITY): Payer: Self-pay

## 2015-01-05 ENCOUNTER — Ambulatory Visit (HOSPITAL_COMMUNITY): Payer: Medicare (Managed Care) | Admitting: Anesthesiology

## 2015-01-05 ENCOUNTER — Ambulatory Visit (HOSPITAL_COMMUNITY)
Admission: RE | Admit: 2015-01-05 | Discharge: 2015-01-05 | Disposition: A | Discharge status: Home / Self Care | Payer: Medicare (Managed Care) | Source: Ambulatory Visit | Attending: Gastroenterology | Admitting: Gastroenterology

## 2015-01-05 DIAGNOSIS — Z96653 Presence of artificial knee joint, bilateral: Secondary | ICD-10-CM | POA: Diagnosis not present

## 2015-01-05 DIAGNOSIS — K3189 Other diseases of stomach and duodenum: Secondary | ICD-10-CM | POA: Diagnosis not present

## 2015-01-05 DIAGNOSIS — K449 Diaphragmatic hernia without obstruction or gangrene: Secondary | ICD-10-CM | POA: Diagnosis not present

## 2015-01-05 DIAGNOSIS — I10 Essential (primary) hypertension: Secondary | ICD-10-CM | POA: Diagnosis not present

## 2015-01-05 DIAGNOSIS — Z9012 Acquired absence of left breast and nipple: Secondary | ICD-10-CM | POA: Diagnosis not present

## 2015-01-05 DIAGNOSIS — Z79899 Other long term (current) drug therapy: Secondary | ICD-10-CM | POA: Insufficient documentation

## 2015-01-05 DIAGNOSIS — D214 Benign neoplasm of connective and other soft tissue of abdomen: Secondary | ICD-10-CM | POA: Diagnosis not present

## 2015-01-05 DIAGNOSIS — K219 Gastro-esophageal reflux disease without esophagitis: Secondary | ICD-10-CM | POA: Insufficient documentation

## 2015-01-05 DIAGNOSIS — E039 Hypothyroidism, unspecified: Secondary | ICD-10-CM | POA: Insufficient documentation

## 2015-01-05 DIAGNOSIS — Z853 Personal history of malignant neoplasm of breast: Secondary | ICD-10-CM | POA: Diagnosis not present

## 2015-01-05 DIAGNOSIS — M1990 Unspecified osteoarthritis, unspecified site: Secondary | ICD-10-CM | POA: Insufficient documentation

## 2015-01-05 HISTORY — PX: EUS: SHX5427

## 2015-01-05 SURGERY — UPPER ENDOSCOPIC ULTRASOUND (EUS) LINEAR
Anesthesia: Monitor Anesthesia Care

## 2015-01-05 MED ORDER — PROPOFOL 10 MG/ML IV BOLUS
INTRAVENOUS | Status: AC
  Filled 2015-01-05: qty 20

## 2015-01-05 MED ORDER — LIDOCAINE HCL (CARDIAC) 20 MG/ML IV SOLN
INTRAVENOUS | Status: AC
  Filled 2015-01-05: qty 5

## 2015-01-05 MED ORDER — ONDANSETRON HCL 4 MG/2ML IJ SOLN
INTRAMUSCULAR | Status: DC | PRN
  Administered 2015-01-05: 4 mg via INTRAVENOUS

## 2015-01-05 MED ORDER — SODIUM CHLORIDE 0.9 % IV SOLN: INTRAVENOUS | Status: DC

## 2015-01-05 MED ORDER — PROPOFOL 10 MG/ML IV BOLUS
INTRAVENOUS | Status: DC | PRN
  Administered 2015-01-05 (×2): 50 mg via INTRAVENOUS

## 2015-01-05 MED ORDER — LACTATED RINGERS IV SOLN
INTRAVENOUS | Status: DC
  Administered 2015-01-05: 1000 mL via INTRAVENOUS

## 2015-01-05 MED ORDER — LIDOCAINE HCL (CARDIAC) 20 MG/ML IV SOLN
INTRAVENOUS | Status: DC | PRN
  Administered 2015-01-05: 50 mg via INTRAVENOUS

## 2015-01-05 MED ORDER — ONDANSETRON HCL 4 MG/2ML IJ SOLN
INTRAMUSCULAR | Status: AC
Start: 2015-01-05 — End: 2015-01-05
  Filled 2015-01-05: qty 2

## 2015-01-05 MED ORDER — PROPOFOL INFUSION 10 MG/ML OPTIME
INTRAVENOUS | Status: DC | PRN
  Administered 2015-01-05: 120 ug/kg/min via INTRAVENOUS

## 2015-01-05 NOTE — Discharge Instructions (Signed)

## 2015-01-05 NOTE — Anesthesia Postprocedure Evaluation (Signed)
  Anesthesia Post-op Note  Patient: Vicki Cervantes  Procedure(s) Performed: Procedure(s) (LRB): UPPER ENDOSCOPIC ULTRASOUND (EUS) LINEAR (N/A)  Patient Location: PACU  Anesthesia Type: MAC  Level of Consciousness: awake and alert   Airway and Oxygen Therapy: Patient Spontanous Breathing  Post-op Pain: mild  Post-op Assessment: Post-op Vital signs reviewed, Patient's Cardiovascular Status Stable, Respiratory Function Stable, Patent Airway and No signs of Nausea or vomiting  Last Vitals:  Filed Vitals:   01/05/15 1050  BP: 150/63  Pulse: 62  Temp:   Resp: 17    Post-op Vital Signs: stable   Complications: No apparent anesthesia complications

## 2015-01-05 NOTE — Transfer of Care (Signed)
Immediate Anesthesia Transfer of Care Note  Patient: Vicki Cervantes  Procedure(s) Performed: Procedure(s): UPPER ENDOSCOPIC ULTRASOUND (EUS) LINEAR (N/A)  Patient Location: PACU  Anesthesia Type:MAC  Level of Consciousness: awake, alert  and oriented  Airway & Oxygen Therapy: Patient Spontanous Breathing and Patient connected to nasal cannula oxygen  Post-op Assessment: Report given to RN and Post -op Vital signs reviewed and stable  Post vital signs: Reviewed and stable  Last Vitals:  Filed Vitals:   01/05/15 0919  BP: 149/63  Pulse: 66  Temp: 36.7 C  Resp: 12    Complications: No apparent anesthesia complications

## 2015-01-05 NOTE — Op Note (Signed)
[facility] [place], [address]   ENDOSCOPIC ULTRASOUND PROCEDURE REPORT  PATIENT: Vicki Cervantes, Vicki Cervantes  MR#: [id number] BIRTHDATE: [birth year]  GENDER: female ENDOSCOPIST: Milus Banister, MD PROCEDURE DATE:  01/05/2015 PROCEDURE:   Upper EUS ASA CLASS:      Class II INDICATIONS:   1.  gastric submucosal lesion noted 2008, last evaluation 2014 EUS Dr.  Ardis Hughs 25mm maximum dimension. MEDICATIONS: Monitored anesthesia care  DESCRIPTION OF PROCEDURE:   After the risks benefits and alternatives of the procedure were  explained, informed consent was obtained. The patient was then placed in the left, lateral, decubitus postion and IV sedation was administered. Throughout the procedure, the patients blood pressure, pulse and oxygen saturations were monitored continuously.  Under direct visualization, the Pentax Radial EUS [id number]  endoscope was introduced through the mouth  and advanced to the pylorus .  Water was used as necessary to provide an acoustic interface.  Upon completion of the imaging, water was removed and the patient was sent to the recovery room in satisfactory condition.  Endoscopic findings: 1. Small hiatal hernia (1-2cm) 2. Proximal gastric submucosal lesion, this appeared unchanged endoscopically compared to last EGD 2 years ago.  EUS findings: 1. The lesion above corresponded with a 16.59mm hyopechoic, round mass that clearly communicates wthe the muscularis propria layer of the proximal gastric wall. 2. No perigastric adenopathy.  ENDOSCOPIC IMPRESSION: The proximal gastric submucosal lesion has not changed in size in the past 2 years, and not signficantly changed in 8 years since first EUS.  At 1.7cm it does not warrant surgical resection.  RECOMMENDATIONS: Follow clinically.  _______________________________ eSignedMilus Banister, MD 01/05/2015 10:37 AM   CC: Scarlette Shorts, MD

## 2015-01-05 NOTE — Anesthesia Preprocedure Evaluation (Signed)
Anesthesia Evaluation  Patient identified by MRN, date of birth, ID band Patient awake    Reviewed: Allergy & Precautions, NPO status , Patient's Chart, lab work & pertinent test results  History of Anesthesia Complications (+) PONV and history of anesthetic complications  Airway Mallampati: II  TM Distance: >3 FB Neck ROM: Full    Dental no notable dental hx.    Pulmonary neg pulmonary ROS,  breath sounds clear to auscultation  Pulmonary exam normal       Cardiovascular Exercise Tolerance: Good hypertension, Pt. on medications Normal cardiovascular examRhythm:Regular Rate:Normal     Neuro/Psych  Neuromuscular disease negative psych ROS   GI/Hepatic Neg liver ROS, hiatal hernia, GERD-  Medicated,  Endo/Other  Hypothyroidism   Renal/GU negative Renal ROS  negative genitourinary   Musculoskeletal  (+) Arthritis -,   Abdominal   Peds negative pediatric ROS (+)  Hematology negative hematology ROS (+)   Anesthesia Other Findings   Reproductive/Obstetrics negative OB ROS                             Anesthesia Physical Anesthesia Plan  ASA: II  Anesthesia Plan: MAC   Post-op Pain Management:    Induction: Intravenous  Airway Management Planned: Natural Airway  Additional Equipment:   Intra-op Plan:   Post-operative Plan:   Informed Consent: I have reviewed the patients History and Physical, chart, labs and discussed the procedure including the risks, benefits and alternatives for the proposed anesthesia with the patient or authorized representative who has indicated his/her understanding and acceptance.   Dental advisory given  Plan Discussed with: CRNA  Anesthesia Plan Comments:         Anesthesia Quick Evaluation

## 2015-01-05 NOTE — H&P (Signed)
  HPI: This is a woman with gastric mass, here for follow up examination  Chief complaint is gastric mass   Past Medical History  Diagnosis Date  . Hypertension   . High triglycerides   . Hypercholesteremia   . Complication of anesthesia     nausea  . PONV (postoperative nausea and vomiting)   . Arthritis   . GERD (gastroesophageal reflux disease)   . Osteoarthritis   . Hemorrhoids   . Hiatal hernia   . Esophageal stricture   . Diverticulosis   . Hypothyroidism   . Breast cancer      LEFT, '82-left breast cancer-surgery only    Past Surgical History  Procedure Laterality Date  . Ovary surgery  1972-1973  . Mastectomy, radical Left 1982  . Rotator cuff repair Left   . Total knee arthroplasty Bilateral 2009-2010    x2  . Foot surgery Left 2009-2010    x2. Surgery in 2011  . Lumbar disc surgery  01-01-13  . Simple mastectomy Left 1982  . Eus N/A 01/07/2013    Procedure: UPPER ENDOSCOPIC ULTRASOUND (EUS) LINEAR;  Surgeon: Milus Banister, MD;  Location: [facility];  Service: Endoscopy;  Laterality: N/A;  . Left heart catheterization with coronary angiogram N/A 01/24/2014    Procedure: LEFT HEART CATHETERIZATION WITH CORONARY ANGIOGRAM;  Surgeon: Peter M Martinique, MD;  Location: [facility] CATH LAB;  Service: Cardiovascular;  Laterality: N/A;    Current Facility-Administered Medications  Medication Dose Route Frequency Provider Last Rate Last Dose  . 0.9 %  sodium chloride infusion   Intravenous Continuous Milus Banister, MD      . lactated ringers infusion   Intravenous Continuous Milus Banister, MD 10 mL/hr at 01/05/15 0939 1,000 mL at 01/05/15 0939    Allergies as of 12/20/2014  . (No Known Allergies)    Family History  Problem Relation Age of Onset  . Heart attack Father   . Heart attack Brother   . Heart disease Mother     History   Social History  . Marital Status: Widowed    Spouse Name: N/A  . Number of Children: 3  . Years of Education: N/A    Occupational History  . 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 Smoking status: Never Smoker   . Smokeless tobacco: Never Used  . Alcohol Use: 0.0 oz/week    0 Standard drinks or equivalent per week     Comment: Wine Occas.  . Drug Use: No  . Sexual Activity: Yes   Other Topics Concern  . Not on file   Social History Narrative     Physical Exam: BP 149/63 mmHg  Pulse 66  Temp(Src) 98.1 F (36.7 C) (Oral)  Resp 12  Ht 5\' 1"  (1.549 m)  Wt 142 lb (64.411 kg)  BMI 26.84 kg/m2  SpO2 97% Constitutional: generally well-appearing Psychiatric: alert and oriented x3 Abdomen: soft, nontender, nondistended, no obvious ascites, no peritoneal signs, normal bowel sounds   Assessment and plan: 74 y.o. female with subepith mass in stomach  For EUS today   Owens Loffler, MD [facility] 01/05/2015, 9:53 AM

## 2015-01-06 ENCOUNTER — Encounter (HOSPITAL_COMMUNITY): Payer: Self-pay | Admitting: Gastroenterology

## 2015-05-11 DIAGNOSIS — H2512 Age-related nuclear cataract, left eye: Secondary | ICD-10-CM | POA: Diagnosis not present

## 2015-05-11 DIAGNOSIS — H25812 Combined forms of age-related cataract, left eye: Secondary | ICD-10-CM | POA: Diagnosis not present

## 2015-06-06 DIAGNOSIS — Z85828 Personal history of other malignant neoplasm of skin: Secondary | ICD-10-CM | POA: Diagnosis not present

## 2015-06-06 DIAGNOSIS — L812 Freckles: Secondary | ICD-10-CM | POA: Diagnosis not present

## 2015-06-06 DIAGNOSIS — D1801 Hemangioma of skin and subcutaneous tissue: Secondary | ICD-10-CM | POA: Diagnosis not present

## 2015-06-06 DIAGNOSIS — L821 Other seborrheic keratosis: Secondary | ICD-10-CM | POA: Diagnosis not present

## 2015-06-06 DIAGNOSIS — L570 Actinic keratosis: Secondary | ICD-10-CM | POA: Diagnosis not present

## 2015-06-13 DIAGNOSIS — H43813 Vitreous degeneration, bilateral: Secondary | ICD-10-CM | POA: Diagnosis not present

## 2015-06-13 DIAGNOSIS — H33193 Other retinoschisis and retinal cysts, bilateral: Secondary | ICD-10-CM | POA: Diagnosis not present

## 2015-06-13 DIAGNOSIS — M7072 Other bursitis of hip, left hip: Secondary | ICD-10-CM | POA: Diagnosis not present

## 2015-06-13 DIAGNOSIS — M25512 Pain in left shoulder: Secondary | ICD-10-CM | POA: Diagnosis not present

## 2015-06-13 DIAGNOSIS — H35372 Puckering of macula, left eye: Secondary | ICD-10-CM | POA: Diagnosis not present

## 2015-06-15 DIAGNOSIS — L570 Actinic keratosis: Secondary | ICD-10-CM | POA: Diagnosis not present

## 2015-06-15 DIAGNOSIS — Z85828 Personal history of other malignant neoplasm of skin: Secondary | ICD-10-CM | POA: Diagnosis not present

## 2015-09-09 DIAGNOSIS — J019 Acute sinusitis, unspecified: Secondary | ICD-10-CM | POA: Diagnosis not present

## 2015-09-15 DIAGNOSIS — N289 Disorder of kidney and ureter, unspecified: Secondary | ICD-10-CM | POA: Diagnosis not present

## 2015-09-15 DIAGNOSIS — I10 Essential (primary) hypertension: Secondary | ICD-10-CM | POA: Diagnosis not present

## 2015-09-15 DIAGNOSIS — R05 Cough: Secondary | ICD-10-CM | POA: Diagnosis not present

## 2015-09-15 DIAGNOSIS — R270 Ataxia, unspecified: Secondary | ICD-10-CM | POA: Diagnosis not present

## 2015-09-15 DIAGNOSIS — E785 Hyperlipidemia, unspecified: Secondary | ICD-10-CM | POA: Diagnosis not present

## 2015-09-15 DIAGNOSIS — Z6825 Body mass index (BMI) 25.0-25.9, adult: Secondary | ICD-10-CM | POA: Diagnosis not present

## 2015-09-26 DIAGNOSIS — R270 Ataxia, unspecified: Secondary | ICD-10-CM | POA: Diagnosis not present

## 2015-09-27 DIAGNOSIS — Z6825 Body mass index (BMI) 25.0-25.9, adult: Secondary | ICD-10-CM | POA: Diagnosis not present

## 2015-09-27 DIAGNOSIS — J019 Acute sinusitis, unspecified: Secondary | ICD-10-CM | POA: Diagnosis not present

## 2015-10-09 DIAGNOSIS — R69 Illness, unspecified: Secondary | ICD-10-CM | POA: Diagnosis not present

## 2015-10-11 DIAGNOSIS — I10 Essential (primary) hypertension: Secondary | ICD-10-CM | POA: Diagnosis not present

## 2015-10-11 DIAGNOSIS — K649 Unspecified hemorrhoids: Secondary | ICD-10-CM | POA: Diagnosis not present

## 2015-10-11 DIAGNOSIS — Z6825 Body mass index (BMI) 25.0-25.9, adult: Secondary | ICD-10-CM | POA: Diagnosis not present

## 2015-10-11 DIAGNOSIS — C50919 Malignant neoplasm of unspecified site of unspecified female breast: Secondary | ICD-10-CM | POA: Diagnosis not present

## 2015-10-11 DIAGNOSIS — R2689 Other abnormalities of gait and mobility: Secondary | ICD-10-CM | POA: Diagnosis not present

## 2015-10-11 DIAGNOSIS — M5126 Other intervertebral disc displacement, lumbar region: Secondary | ICD-10-CM | POA: Diagnosis not present

## 2015-10-11 DIAGNOSIS — E785 Hyperlipidemia, unspecified: Secondary | ICD-10-CM | POA: Diagnosis not present

## 2015-10-11 DIAGNOSIS — Z1389 Encounter for screening for other disorder: Secondary | ICD-10-CM | POA: Diagnosis not present

## 2015-10-11 DIAGNOSIS — E784 Other hyperlipidemia: Secondary | ICD-10-CM | POA: Diagnosis not present

## 2015-10-11 DIAGNOSIS — K219 Gastro-esophageal reflux disease without esophagitis: Secondary | ICD-10-CM | POA: Diagnosis not present

## 2015-10-11 DIAGNOSIS — Z0000 Encounter for general adult medical examination without abnormal findings: Secondary | ICD-10-CM | POA: Diagnosis not present

## 2015-10-11 DIAGNOSIS — M1990 Unspecified osteoarthritis, unspecified site: Secondary | ICD-10-CM | POA: Diagnosis not present

## 2015-11-08 DIAGNOSIS — M25512 Pain in left shoulder: Secondary | ICD-10-CM | POA: Diagnosis not present

## 2015-11-08 DIAGNOSIS — M7552 Bursitis of left shoulder: Secondary | ICD-10-CM | POA: Diagnosis not present

## 2015-11-10 DIAGNOSIS — M25512 Pain in left shoulder: Secondary | ICD-10-CM | POA: Diagnosis not present

## 2015-11-10 DIAGNOSIS — M75112 Incomplete rotator cuff tear or rupture of left shoulder, not specified as traumatic: Secondary | ICD-10-CM | POA: Diagnosis not present

## 2015-11-10 DIAGNOSIS — Z9889 Other specified postprocedural states: Secondary | ICD-10-CM | POA: Diagnosis not present

## 2015-11-10 DIAGNOSIS — S46112A Strain of muscle, fascia and tendon of long head of biceps, left arm, initial encounter: Secondary | ICD-10-CM | POA: Diagnosis not present

## 2015-11-10 DIAGNOSIS — M19012 Primary osteoarthritis, left shoulder: Secondary | ICD-10-CM | POA: Diagnosis not present

## 2015-11-13 DIAGNOSIS — M75112 Incomplete rotator cuff tear or rupture of left shoulder, not specified as traumatic: Secondary | ICD-10-CM | POA: Diagnosis not present

## 2015-12-14 DIAGNOSIS — H33193 Other retinoschisis and retinal cysts, bilateral: Secondary | ICD-10-CM | POA: Diagnosis not present

## 2015-12-14 DIAGNOSIS — R69 Illness, unspecified: Secondary | ICD-10-CM | POA: Diagnosis not present

## 2015-12-19 DIAGNOSIS — L812 Freckles: Secondary | ICD-10-CM | POA: Diagnosis not present

## 2015-12-19 DIAGNOSIS — L720 Epidermal cyst: Secondary | ICD-10-CM | POA: Diagnosis not present

## 2015-12-19 DIAGNOSIS — D1801 Hemangioma of skin and subcutaneous tissue: Secondary | ICD-10-CM | POA: Diagnosis not present

## 2015-12-19 DIAGNOSIS — L821 Other seborrheic keratosis: Secondary | ICD-10-CM | POA: Diagnosis not present

## 2015-12-19 DIAGNOSIS — D485 Neoplasm of uncertain behavior of skin: Secondary | ICD-10-CM | POA: Diagnosis not present

## 2015-12-19 DIAGNOSIS — Z85828 Personal history of other malignant neoplasm of skin: Secondary | ICD-10-CM | POA: Diagnosis not present

## 2015-12-19 DIAGNOSIS — L570 Actinic keratosis: Secondary | ICD-10-CM | POA: Diagnosis not present

## 2016-01-05 DIAGNOSIS — J029 Acute pharyngitis, unspecified: Secondary | ICD-10-CM | POA: Diagnosis not present

## 2016-01-05 DIAGNOSIS — R05 Cough: Secondary | ICD-10-CM | POA: Diagnosis not present

## 2016-01-08 DIAGNOSIS — L570 Actinic keratosis: Secondary | ICD-10-CM | POA: Diagnosis not present

## 2016-01-08 DIAGNOSIS — M7061 Trochanteric bursitis, right hip: Secondary | ICD-10-CM | POA: Diagnosis not present

## 2016-01-08 DIAGNOSIS — M25512 Pain in left shoulder: Secondary | ICD-10-CM | POA: Diagnosis not present

## 2016-01-08 DIAGNOSIS — Z85828 Personal history of other malignant neoplasm of skin: Secondary | ICD-10-CM | POA: Diagnosis not present

## 2016-03-20 DIAGNOSIS — R69 Illness, unspecified: Secondary | ICD-10-CM | POA: Diagnosis not present

## 2016-04-05 DIAGNOSIS — N390 Urinary tract infection, site not specified: Secondary | ICD-10-CM | POA: Diagnosis not present

## 2016-04-05 DIAGNOSIS — R8290 Unspecified abnormal findings in urine: Secondary | ICD-10-CM | POA: Diagnosis not present

## 2016-04-05 DIAGNOSIS — I10 Essential (primary) hypertension: Secondary | ICD-10-CM | POA: Diagnosis not present

## 2016-04-05 DIAGNOSIS — E784 Other hyperlipidemia: Secondary | ICD-10-CM | POA: Diagnosis not present

## 2016-04-12 DIAGNOSIS — M5126 Other intervertebral disc displacement, lumbar region: Secondary | ICD-10-CM | POA: Diagnosis not present

## 2016-04-12 DIAGNOSIS — K219 Gastro-esophageal reflux disease without esophagitis: Secondary | ICD-10-CM | POA: Diagnosis not present

## 2016-04-12 DIAGNOSIS — Z0000 Encounter for general adult medical examination without abnormal findings: Secondary | ICD-10-CM | POA: Diagnosis not present

## 2016-04-12 DIAGNOSIS — C50919 Malignant neoplasm of unspecified site of unspecified female breast: Secondary | ICD-10-CM | POA: Diagnosis not present

## 2016-04-12 DIAGNOSIS — R2689 Other abnormalities of gait and mobility: Secondary | ICD-10-CM | POA: Diagnosis not present

## 2016-04-12 DIAGNOSIS — E784 Other hyperlipidemia: Secondary | ICD-10-CM | POA: Diagnosis not present

## 2016-04-12 DIAGNOSIS — Z23 Encounter for immunization: Secondary | ICD-10-CM | POA: Diagnosis not present

## 2016-04-12 DIAGNOSIS — M1990 Unspecified osteoarthritis, unspecified site: Secondary | ICD-10-CM | POA: Diagnosis not present

## 2016-04-12 DIAGNOSIS — K649 Unspecified hemorrhoids: Secondary | ICD-10-CM | POA: Diagnosis not present

## 2016-04-12 DIAGNOSIS — I10 Essential (primary) hypertension: Secondary | ICD-10-CM | POA: Diagnosis not present

## 2016-04-29 DIAGNOSIS — M47816 Spondylosis without myelopathy or radiculopathy, lumbar region: Secondary | ICD-10-CM | POA: Diagnosis not present

## 2016-04-29 DIAGNOSIS — M5126 Other intervertebral disc displacement, lumbar region: Secondary | ICD-10-CM | POA: Diagnosis not present

## 2016-04-29 DIAGNOSIS — M4316 Spondylolisthesis, lumbar region: Secondary | ICD-10-CM | POA: Diagnosis not present

## 2016-04-29 DIAGNOSIS — M5136 Other intervertebral disc degeneration, lumbar region: Secondary | ICD-10-CM | POA: Diagnosis not present

## 2016-05-16 DIAGNOSIS — M25512 Pain in left shoulder: Secondary | ICD-10-CM | POA: Diagnosis not present

## 2016-06-10 DIAGNOSIS — L821 Other seborrheic keratosis: Secondary | ICD-10-CM | POA: Diagnosis not present

## 2016-06-10 DIAGNOSIS — L570 Actinic keratosis: Secondary | ICD-10-CM | POA: Diagnosis not present

## 2016-06-10 DIAGNOSIS — Z85828 Personal history of other malignant neoplasm of skin: Secondary | ICD-10-CM | POA: Diagnosis not present

## 2016-06-19 DIAGNOSIS — J209 Acute bronchitis, unspecified: Secondary | ICD-10-CM | POA: Diagnosis not present

## 2016-06-19 DIAGNOSIS — R062 Wheezing: Secondary | ICD-10-CM | POA: Diagnosis not present

## 2016-06-25 DIAGNOSIS — J209 Acute bronchitis, unspecified: Secondary | ICD-10-CM | POA: Diagnosis not present

## 2016-06-25 DIAGNOSIS — R062 Wheezing: Secondary | ICD-10-CM | POA: Diagnosis not present

## 2016-07-30 DIAGNOSIS — Z6825 Body mass index (BMI) 25.0-25.9, adult: Secondary | ICD-10-CM | POA: Diagnosis not present

## 2016-07-30 DIAGNOSIS — I10 Essential (primary) hypertension: Secondary | ICD-10-CM | POA: Diagnosis not present

## 2016-07-30 DIAGNOSIS — K219 Gastro-esophageal reflux disease without esophagitis: Secondary | ICD-10-CM | POA: Diagnosis not present

## 2016-07-30 DIAGNOSIS — E785 Hyperlipidemia, unspecified: Secondary | ICD-10-CM | POA: Diagnosis not present

## 2016-08-09 DIAGNOSIS — Z6825 Body mass index (BMI) 25.0-25.9, adult: Secondary | ICD-10-CM | POA: Diagnosis not present

## 2016-08-09 DIAGNOSIS — I10 Essential (primary) hypertension: Secondary | ICD-10-CM | POA: Diagnosis not present

## 2016-08-09 DIAGNOSIS — J309 Allergic rhinitis, unspecified: Secondary | ICD-10-CM | POA: Diagnosis not present

## 2016-08-09 DIAGNOSIS — R270 Ataxia, unspecified: Secondary | ICD-10-CM | POA: Diagnosis not present

## 2016-08-16 DIAGNOSIS — K219 Gastro-esophageal reflux disease without esophagitis: Secondary | ICD-10-CM | POA: Diagnosis not present

## 2016-08-16 DIAGNOSIS — I10 Essential (primary) hypertension: Secondary | ICD-10-CM | POA: Diagnosis not present

## 2016-08-16 DIAGNOSIS — R739 Hyperglycemia, unspecified: Secondary | ICD-10-CM | POA: Diagnosis not present

## 2016-08-16 DIAGNOSIS — E785 Hyperlipidemia, unspecified: Secondary | ICD-10-CM | POA: Diagnosis not present

## 2016-08-16 DIAGNOSIS — E039 Hypothyroidism, unspecified: Secondary | ICD-10-CM | POA: Diagnosis not present

## 2016-08-23 DIAGNOSIS — N950 Postmenopausal bleeding: Secondary | ICD-10-CM | POA: Diagnosis not present

## 2016-08-23 DIAGNOSIS — R8290 Unspecified abnormal findings in urine: Secondary | ICD-10-CM | POA: Diagnosis not present

## 2016-08-26 DIAGNOSIS — N950 Postmenopausal bleeding: Secondary | ICD-10-CM | POA: Diagnosis not present

## 2016-08-29 ENCOUNTER — Other Ambulatory Visit: Payer: Self-pay | Admitting: Obstetrics & Gynecology

## 2016-08-29 DIAGNOSIS — N950 Postmenopausal bleeding: Secondary | ICD-10-CM | POA: Diagnosis not present

## 2016-08-29 DIAGNOSIS — C541 Malignant neoplasm of endometrium: Secondary | ICD-10-CM | POA: Diagnosis not present

## 2016-09-03 ENCOUNTER — Telehealth: Payer: Self-pay

## 2016-09-03 NOTE — Telephone Encounter (Signed)
Pt was offered next available appointment on 3/13, pt declined, [place]. Appointment scheduled 3/19

## 2016-09-11 DIAGNOSIS — Z1212 Encounter for screening for malignant neoplasm of rectum: Secondary | ICD-10-CM | POA: Diagnosis not present

## 2016-09-24 ENCOUNTER — Ambulatory Visit: Payer: Medicare (Managed Care) | Admitting: Gynecologic Oncology

## 2016-09-30 ENCOUNTER — Encounter: Payer: Self-pay | Admitting: Gynecologic Oncology

## 2016-09-30 ENCOUNTER — Other Ambulatory Visit (HOSPITAL_BASED_OUTPATIENT_CLINIC_OR_DEPARTMENT_OTHER): Payer: Medicare HMO

## 2016-09-30 ENCOUNTER — Ambulatory Visit: Payer: Medicare HMO | Attending: Gynecologic Oncology | Admitting: Gynecologic Oncology

## 2016-09-30 VITALS — BP 155/70 | HR 80 | Temp 98.0°F | Resp 18 | Ht 61.0 in | Wt 138.3 lb

## 2016-09-30 DIAGNOSIS — Z7982 Long term (current) use of aspirin: Secondary | ICD-10-CM | POA: Insufficient documentation

## 2016-09-30 DIAGNOSIS — Z79899 Other long term (current) drug therapy: Secondary | ICD-10-CM | POA: Insufficient documentation

## 2016-09-30 DIAGNOSIS — N950 Postmenopausal bleeding: Secondary | ICD-10-CM | POA: Diagnosis not present

## 2016-09-30 DIAGNOSIS — C541 Malignant neoplasm of endometrium: Secondary | ICD-10-CM | POA: Insufficient documentation

## 2016-09-30 LAB — BUN AND CREATININE (CC13)
BUN: 24.1 mg/dL (ref 7.0–26.0)
CREATININE: 1.3 mg/dL — AB (ref 0.6–1.1)
EGFR: 42 mL/min/{1.73_m2} — ABNORMAL LOW (ref >90)

## 2016-09-30 NOTE — Patient Instructions (Addendum)
Preparing for your Surgery  Plan for surgery on April 10 with Dr. Janie Morning  Planned procedure is robotic hysterectomy with bilateral salpingo-oophorectomy and sentinel node biopsy  Pre-operative Testing -You will receive a phone call from presurgical testing at [facility] to arrange for a pre-operative testing appointment before your surgery.  This appointment normally occurs one to two weeks before your scheduled surgery.   -Bring your insurance card, copy of an advanced directive if applicable, medication list  -At that visit, you will be asked to sign a consent for a possible blood transfusion in case a transfusion becomes necessary during surgery.  The need for a blood transfusion is rare but having consent is a necessary part of your care.     -You should not be taking blood thinners or aspirin at least ten days prior to surgery unless instructed by your surgeon.  Day Before Surgery at [place] will be asked to take in a light diet the day before surgery.  Avoid carbonated beverages.  You will be advised to have nothing to eat or drink after midnight the evening before.     Eat a light diet the day before surgery.  Examples including soups, broths,  toast, yogurt, mashed potatoes.  Things to avoid include carbonated beverages  (fizzy beverages), raw fruits and raw vegetables, or beans.    If your bowels are filled with gas, your surgeon will have difficulty  visualizing your pelvic organs which increases your surgical risks.  Your role in recovery Your role is to become active as soon as directed by your doctor, while still giving yourself time to heal.  Rest when you feel tired. You will be asked to do the following in order to speed your recovery:  - Cough and breathe deeply. This helps toclear and expand your lungs and can prevent pneumonia. You may be given a spirometer to practice deep breathing. A staff member will show you how to use the  spirometer. - Do mild physical activity. Walking or moving your legs help your circulation and body functions return to normal. A staff member will help you when you try to walk and will provide you with simple exercises. Do not try to get up or walk alone the first time. - Actively manage your pain. Managing your pain lets you move in comfort. We will ask you to rate your pain on a scale of zero to 10. It is your responsibility to tell your doctor or nurse where and how much you hurt so your pain can be treated.  Special Considerations -If you are diabetic, you may be placed on insulin after surgery to have closer control over your blood sugars to promote healing and recovery.  This does not mean that you will be discharged on insulin.  If applicable, your oral antidiabetics will be resumed when you are tolerating a solid diet.  -Your final pathology results from surgery should be available by the Friday after surgery and the results will be relayed to you when available.

## 2016-09-30 NOTE — Progress Notes (Signed)
Consult Note: Gyn-Onc  Consult was requested by Dr. Stann Cervantes for the evaluation of Vicki Cervantes 76 y.o. female  CC:  Chief Complaint  Patient presents with  . PMB    Assessment/Plan:  [name]  is a 76 y.o.  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  A detailed discussion was held with the patient and her family with regard to to her endometrial cancer diagnosis. We discussed the standard management options for uterine cancer which includes surgery followed possibly by adjuvant therapy depending on the results of surgery. The options for surgical management include a hysterectomy and removal of the tubes and ovaries possibly with removal of pelvic and para-aortic lymph nodes.If feasible, a minimally invasive approach including a robotic hysterectomy or laparoscopic hysterectomy have benefits including shorter hospital stay, recovery time and better wound healing than with open surgery. The patient has been counseled about these surgical options and the risks of surgery in general including infection, bleeding, damage to surrounding structures (including bowel, bladder, ureters, nerves or vessels), and the postoperative risks of PE/ DVT, and lymphedema. I extensively reviewed the additional risks of robotic hysterectomy including possible need for conversion to open laparotomy.  I discussed positioning during surgery of trendelenberg and risks of minor facial swelling and care we take in preoperative positioning.  After counseling and consideration of her options, she desires to proceed with robotic assisted total hysterectomy with bilateral sapingo-oophorectomy and SLN biopsy.  I discussed that she is likely to require adjuvant therapy post-op due to the high grade nature of her disease.   She will be seen by anesthesia for preoperative clearance and discussion of postoperative pain management.  She was given the opportunity to ask questions, which were answered to her satisfaction, and she  is agreement with the above mentioned plan of care.  I have recommended she stop ASA now preop as this increases her bleeding risk.  We will obtain preoperative CT abd/pelvis/chest to evaluate for metastatic disease.  HPI: Vicki Cervantes is a 76 year old parous woman who is seen in consultation at the request of Dr Vicki Cervantes for high grade serous endometrial cancer.  The patient reports post menopausal spotting since December 2017. She reported this to Dr Vicki Cervantes who performed an [place] which showed a normal sized uterus but with a thickened endometrial lining. Endometrial biopsy was taken on 08/29/16 which showed a high grade endometrial cancer, favor serous morphology.  The patient is otherwise fairly healthy. She has had 2 prior vaginal deliveries. She has a remote history of breast cancer at age 74 but received BRCA testing approximately 5 years ago which was negative. Her cancer was treated with surgery but no radiation or chemotherapy.   She has a history of a laparotomy for endometriosis which involved a unilateral salpingo-oophorectomy and possible partial oophorectomy on the contralateral side (>30 years ago). She is unclear which side was removed.    She takes aspirin 81 mg daily.  Current Meds:  Outpatient Encounter Prescriptions as of 09/30/2016  Medication Sig  . aspirin EC 81 MG tablet Take 81 mg by mouth daily.  Marland Kitchen atorvastatin (LIPITOR) 20 MG tablet Take 20 mg by mouth every evening.   . Calcium Carbonate-Vitamin D (CALCIUM-VITAMIN D) 500-200 MG-UNIT per tablet Take 1 tablet by mouth daily.  . celecoxib (CELEBREX) 200 MG capsule Take 200 mg by mouth daily.   . citalopram (CELEXA) 20 MG tablet Take 20 mg by mouth at bedtime.   Marland Kitchen levothyroxine (SYNTHROID, LEVOTHROID) 100 MCG  tablet Take 100 mcg by mouth daily before breakfast.   . Multiple Vitamin (MULTIVITAMIN WITH MINERALS) TABS Take 1 tablet by mouth daily.  . pantoprazole (PROTONIX) 40 MG tablet Take 40 mg by mouth every morning.   .  raloxifene (EVISTA) 60 MG tablet Take 60 mg by mouth daily.    . valsartan-hydrochlorothiazide (DIOVAN-HCT) 320-25 MG per tablet Take 1 tablet by mouth at bedtime.    No facility-administered encounter medications on file as of 09/30/2016.     Allergy: No Known Allergies  Social Hx:   Social History   Social History  . Marital status: Widowed    Spouse name: N/A  . Number of children: 3  . Years of education: N/A   Occupational History  . 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 Smoking status: Never Smoker  . Smokeless tobacco: Never Used  . Alcohol use 0.0 oz/week     Comment: Wine Occas.  . Drug use: No  . Sexual activity: Yes   Other Topics Concern  . Not on file   Social History Narrative  . No narrative on file    Past Surgical Hx:  Past Surgical History:  Procedure Laterality Date  . EUS N/A 01/07/2013   Procedure: UPPER ENDOSCOPIC ULTRASOUND (EUS) LINEAR;  Surgeon: Milus Banister, MD;  Location: [facility];  Service: Endoscopy;  Laterality: N/A;  . EUS N/A 01/05/2015   Procedure: UPPER ENDOSCOPIC ULTRASOUND (EUS) LINEAR;  Surgeon: Milus Banister, MD;  Location: [facility];  Service: Endoscopy;  Laterality: N/A;  . FOOT SURGERY Left 2009-2010   x2. Surgery in 2011  . LEFT HEART CATHETERIZATION WITH CORONARY ANGIOGRAM N/A 01/24/2014   Procedure: LEFT HEART CATHETERIZATION WITH CORONARY ANGIOGRAM;  Surgeon: Peter M Martinique, MD;  Location: [facility] CATH LAB;  Service: Cardiovascular;  Laterality: N/A;  . LUMBAR [place] SURGERY  01-01-13  . MASTECTOMY, RADICAL Left 1982  . OVARY SURGERY  1972-1973  . ROTATOR CUFF REPAIR Left   . SIMPLE MASTECTOMY Left 1982  . TOTAL KNEE ARTHROPLASTY Bilateral 2009-2010   x2    Past Medical Hx:  Past Medical History:  Diagnosis Date  . Arthritis   . Breast cancer ([place])     LEFT, '82-left breast cancer-surgery only  . Complication of anesthesia    nausea  . Diverticulosis   . Esophageal stricture    . GERD (gastroesophageal reflux disease)   . Hemorrhoids   . Hiatal hernia   . High triglycerides   . Hypercholesteremia   . Hypertension   . Hypothyroidism   . Osteoarthritis   . PONV (postoperative nausea and vomiting)     Past Gynecological History:  G2P2 No LMP recorded. Patient is postmenopausal.  Family Hx:  Family History  Problem Relation Age of Onset  . Heart disease Mother   . Heart attack Father   . Heart attack Brother     Review of Systems:  Constitutional  Feels well,    ENT Normal appearing ears and nares bilaterally Skin/Breast  No rash, sores, jaundice, itching, dryness Cardiovascular  No chest pain, shortness of breath, or edema  Pulmonary  No cough or wheeze.  Gastro Intestinal  No nausea, vomitting, or diarrhoea. No bright red blood per rectum, no abdominal pain, change in bowel movement, or constipation.  Genito Urinary  No frequency, urgency, dysuria, +postmenopausal bleeding Musculo Skeletal  No myalgia, arthralgia, joint swelling or pain  Neurologic  No weakness, numbness, change in gait,  Psychology  No depression, anxiety, insomnia.   Vitals:  Blood pressure (!) 155/70, pulse 80, temperature 98 F (36.7 C), temperature source Oral, resp. rate 18, height '5\' 1"'  (1.549 m), weight 138 lb 4.8 oz (62.7 kg), SpO2 97 %.  Physical Exam: WD in NAD Neck  Supple NROM, without any enlargements.  Lymph Node Survey No cervical supraclavicular or inguinal adenopathy Cardiovascular  Pulse normal rate, regularity and rhythm. S1 and S2 normal.  Lungs  Clear to auscultation bilateraly, without wheezes/crackles/rhonchi. Good air movement.  Skin  No rash/lesions/breakdown  Psychiatry  Alert and oriented to person, place, and time  Abdomen  Normoactive bowel sounds, abdomen soft, non-tender and overweight without evidence of hernia.  Back No CVA tenderness Genito Urinary  Vulva/vagina: Normal external female genitalia.  No lesions. No discharge  or bleeding.  Bladder/urethra:  No lesions or masses, well supported bladder  Vagina: normal  Cervix: Normal appearing, no lesions.  Uterus:  Small, mobile, no parametrial involvement or nodularity.  Adnexa: no palpable masses. Rectal  deferred Extremities  No bilateral cyanosis, clubbing or edema.   Donaciano Eva, MD  09/30/2016, 10:02 AM

## 2016-10-01 ENCOUNTER — Telehealth: Payer: Self-pay

## 2016-10-01 NOTE — Telephone Encounter (Signed)
RN called to inform the patient that her CT Scan was scheduled for 10/03/16 at 1:30pm

## 2016-10-03 ENCOUNTER — Ambulatory Visit (HOSPITAL_COMMUNITY)
Admission: RE | Admit: 2016-10-03 | Discharge: 2016-10-03 | Disposition: A | Discharge status: Home / Self Care | Payer: Medicare HMO | Source: Ambulatory Visit | Attending: Gynecologic Oncology | Admitting: Gynecologic Oncology

## 2016-10-03 ENCOUNTER — Encounter (HOSPITAL_COMMUNITY): Payer: Self-pay

## 2016-10-03 DIAGNOSIS — I700 Atherosclerosis of aorta: Secondary | ICD-10-CM | POA: Diagnosis not present

## 2016-10-03 DIAGNOSIS — K7689 Other specified diseases of liver: Secondary | ICD-10-CM | POA: Insufficient documentation

## 2016-10-03 DIAGNOSIS — C541 Malignant neoplasm of endometrium: Secondary | ICD-10-CM

## 2016-10-03 DIAGNOSIS — I2510 Atherosclerotic heart disease of native coronary artery without angina pectoris: Secondary | ICD-10-CM | POA: Insufficient documentation

## 2016-10-03 DIAGNOSIS — R918 Other nonspecific abnormal finding of lung field: Secondary | ICD-10-CM | POA: Diagnosis not present

## 2016-10-03 DIAGNOSIS — K3189 Other diseases of stomach and duodenum: Secondary | ICD-10-CM | POA: Diagnosis not present

## 2016-10-03 MED ORDER — IOPAMIDOL (ISOVUE-300) INJECTION 61%
INTRAVENOUS | Status: AC
  Administered 2016-10-03: 100 mL
  Filled 2016-10-03: qty 100

## 2016-10-04 NOTE — Telephone Encounter (Signed)
Told ms Deam that Dr. Denman George was at a conference and ou of the office until 10-15-16. She would likely get her results after this time.  Pt verbalized understanding.

## 2016-10-15 ENCOUNTER — Telehealth: Payer: Self-pay | Admitting: Gynecologic Oncology

## 2016-10-15 NOTE — Progress Notes (Signed)
04-12-16 EKG on chart

## 2016-10-15 NOTE — Patient Instructions (Addendum)
Vicki Cervantes  10/15/2016   Your procedure is scheduled on: 10-22-16  Report to [facility] Main  Entrance take [facility]  elevators to 3rd floor to  [place] at [place] AM.  Call this number if you have problems the morning of surgery [phone]   Remember: ONLY 1 PERSON MAY GO WITH YOU TO SHORT STAY TO GET  READY MORNING OF [place].  You may eat a Light Diet the day prior to surgery until Midnight. Examples of a light diet would be Soup, Broth, Toast, Yogurt  and Mashed Potatoes.Avoid carbonated beverages, raw fruits and vegetables and beans.  Then nothing by mouth after midnight   Take these medicines the morning of surgery with A SIP OF WATER: Levothyroxine (Synthroid), Pantoprazole (Protonix)                               You may not have any metal on your body including hair pins and              piercings  Do not wear jewelry, make-up, lotions, powders or perfumes, deodorant             Do not wear nail polish.  Do not shave  48 hours prior to surgery.     Do not bring valuables to the hospital. [place].  Contacts, dentures or bridgework may not be worn into surgery.  Leave suitcase in the car. After surgery it may be brought to your room.       Special Instructions: coughing and deep breathing exercises, leg exercises               Please read over the following fact sheets you were given: _____________________________________________________________________ [facility] - Preparing for Surgery Before surgery, you can play an important role.  Because skin is not sterile, your skin needs to be as free of germs as possible.  You can reduce the number of germs on your skin by washing with CHG (chlorahexidine gluconate) soap before surgery.  CHG is an antiseptic cleaner which kills germs and bonds with the skin to continue killing germs even after washing. Please DO NOT use if you have an allergy to  CHG or antibacterial soaps.  If your skin becomes reddened/irritated stop using the CHG and inform your nurse when you arrive at Short Stay. Do not shave (including legs and underarms) for at least 48 hours prior to the first CHG shower.  You may shave your face/neck. Please follow these instructions carefully:  1.  Shower with CHG Soap the night before surgery and the  morning of Surgery.  2.  If you choose to wash your hair, wash your hair first as usual with your  normal  shampoo.  3.  After you shampoo, rinse your hair and body thoroughly to remove the  shampoo.                           4.  Use CHG as you would any other liquid soap.  You can apply chg directly  to the skin and wash  Gently with a scrungie or clean washcloth.  5.  Apply the CHG Soap to your body ONLY FROM THE NECK DOWN.   Do not use on face/ open                           Wound or open sores. Avoid contact with eyes, ears mouth and genitals (private parts).                       Wash face,  Genitals (private parts) with your normal soap.             6.  Wash thoroughly, paying special attention to the area where your surgery  will be performed.  7.  Thoroughly rinse your body with warm water from the neck down.  8.  DO NOT shower/wash with your normal soap after using and rinsing off  the CHG Soap.                9.  Pat yourself dry with a clean towel.            10.  Wear clean pajamas.            11.  Place clean sheets on your bed the night of your first shower and do not  sleep with pets. Day of Surgery : Do not apply any lotions/deodorants the morning of surgery.  Please wear clean clothes to the hospital/surgery center.  FAILURE TO FOLLOW THESE INSTRUCTIONS MAY RESULT IN THE CANCELLATION OF YOUR SURGERY PATIENT SIGNATURE_________________________________  NURSE SIGNATURE__________________________________  ________________________________________________________________________   Adam Phenix  An incentive spirometer is a tool that can help keep your lungs clear and active. This tool measures how well you are filling your lungs with each breath. Taking long deep breaths may help reverse or decrease the chance of developing breathing (pulmonary) problems (especially infection) following:  A long period of time when you are unable to move or be active. BEFORE THE PROCEDURE   If the spirometer includes an indicator to show your best effort, your nurse or respiratory therapist will set it to a desired goal.  If possible, sit up straight or lean slightly forward. Try not to slouch.  Hold the incentive spirometer in an upright position. INSTRUCTIONS FOR USE  1. Sit on the edge of your bed if possible, or sit up as far as you can in bed or on a chair. 2. Hold the incentive spirometer in an upright position. 3. Breathe out normally. 4. Place the mouthpiece in your mouth and seal your lips tightly around it. 5. Breathe in slowly and as deeply as possible, raising the piston or the ball toward the top of the column. 6. Hold your breath for 3-5 seconds or for as long as possible. Allow the piston or ball to fall to the bottom of the column. 7. Remove the mouthpiece from your mouth and breathe out normally. 8. Rest for a few seconds and repeat Steps 1 through 7 at least 10 times every 1-2 hours when you are awake. Take your time and take a few normal breaths between deep breaths. 9. The spirometer may include an indicator to show your best effort. Use the indicator as a goal to work toward during each repetition. 10. After each set of 10 deep breaths, practice coughing to be sure your lungs are clear. If you have an incision (the cut made at the time of surgery),  support your incision when coughing by placing a pillow or rolled up towels firmly against it. Once you are able to get out of bed, walk around indoors and cough well. You may stop using the incentive spirometer when  instructed by your caregiver.  RISKS AND COMPLICATIONS  Take your time so you do not get dizzy or light-headed.  If you are in pain, you may need to take or ask for pain medication before doing incentive spirometry. It is harder to take a deep breath if you are having pain. AFTER USE  Rest and breathe slowly and easily.  It can be helpful to keep track of a log of your progress. Your caregiver can provide you with a simple table to help with this. If you are using the spirometer at home, follow these instructions: [place] IF:   You are having difficultly using the spirometer.  You have trouble using the spirometer as often as instructed.  Your pain medication is not giving enough relief while using the spirometer.  You develop fever of 100.5 F (38.1 C) or higher. SEEK IMMEDIATE MEDICAL CARE IF:   You cough up bloody sputum that had not been present before.  You develop fever of 102 F (38.9 C) or greater.  You develop worsening pain at or near the incision site. MAKE SURE YOU:   Understand these instructions.  Will watch your condition.  Will get help right away if you are not doing well or get worse. Document Released: 11/11/2006 Document Revised: 09/23/2011 Document Reviewed: 01/12/2007 ExitCare Patient Information 2014 ExitCare, [place].   ________________________________________________________________________  WHAT IS A BLOOD TRANSFUSION? Blood Transfusion Information  A transfusion is the replacement of blood or some of its parts. Blood is made up of multiple cells which provide different functions.  Red blood cells carry oxygen and are used for blood loss replacement.  White blood cells fight against infection.  Platelets control bleeding.  Plasma helps clot blood.  Other blood products are available for specialized needs, such as hemophilia or other clotting disorders. BEFORE THE TRANSFUSION  Who gives blood for transfusions?   Healthy  volunteers who are fully evaluated to make sure their blood is safe. This is blood bank blood. Transfusion therapy is the safest it has ever been in the practice of medicine. Before blood is taken from a donor, a complete history is taken to make sure that person has no history of diseases nor engages in risky social behavior (examples are intravenous drug use or sexual activity with multiple partners). The donor's travel history is screened to minimize risk of transmitting infections, such as malaria. The donated blood is tested for signs of infectious diseases, such as HIV and hepatitis. The blood is then tested to be sure it is compatible with you in order to minimize the chance of a transfusion reaction. If you or a relative donates blood, this is often done in anticipation of surgery and is not appropriate for emergency situations. It takes many days to process the donated blood. RISKS AND COMPLICATIONS Although transfusion therapy is very safe and saves many lives, the main dangers of transfusion include:   Getting an infectious disease.  Developing a transfusion reaction. This is an allergic reaction to something in the blood you were given. Every precaution is taken to prevent this. The decision to have a blood transfusion has been considered carefully by your caregiver before blood is given. Blood is not given unless the benefits outweigh the risks. AFTER THE TRANSFUSION  Right after receiving a blood transfusion, you will usually feel much better and more energetic. This is especially true if your red blood cells have gotten low (anemic). The transfusion raises the level of the red blood cells which carry oxygen, and this usually causes an energy increase.  The nurse administering the transfusion will monitor you carefully for complications. HOME CARE INSTRUCTIONS  No special instructions are needed after a transfusion. You may find your energy is better. Speak with your caregiver about any  limitations on activity for underlying diseases you may have. SEEK MEDICAL CARE IF:   Your condition is not improving after your transfusion.  You develop redness or irritation at the intravenous (IV) site. SEEK IMMEDIATE MEDICAL CARE IF:  Any of the following symptoms occur over the next 12 hours:  Shaking chills.  You have a temperature by mouth above 102 F (38.9 C), not controlled by medicine.  Chest, back, or muscle pain.  People around you feel you are not acting correctly or are confused.  Shortness of breath or difficulty breathing.  Dizziness and fainting.  You get a rash or develop hives.  You have a decrease in urine output.  Your urine turns a dark color or changes to pink, red, or brown. Any of the following symptoms occur over the next 10 days:  You have a temperature by mouth above 102 F (38.9 C), not controlled by medicine.  Shortness of breath.  Weakness after normal activity.  The white part of the eye turns yellow (jaundice).  You have a decrease in the amount of urine or are urinating less often.  Your urine turns a dark color or changes to pink, red, or brown. Document Released: 06/28/2000 Document Revised: 09/23/2011 Document Reviewed: 02/15/2008 [facility] Patient Information 2014 [place], [place].  _______________________________________________________________________

## 2016-10-15 NOTE — Telephone Encounter (Signed)
Called and left message to call back.  I will discuss her CT with her.  It showed no signs of cancer spread, but had several incidental findings with recommendations for follow-up scans to monitor things.  Donaciano Eva, MD

## 2016-10-17 ENCOUNTER — Encounter (HOSPITAL_COMMUNITY)
Admission: RE | Admit: 2016-10-17 | Discharge: 2016-10-17 | Disposition: A | Discharge status: Home / Self Care | Payer: Medicare HMO | Source: Ambulatory Visit | Attending: Gynecologic Oncology | Admitting: Gynecologic Oncology

## 2016-10-17 ENCOUNTER — Encounter (HOSPITAL_COMMUNITY): Payer: Self-pay

## 2016-10-17 DIAGNOSIS — Z01818 Encounter for other preprocedural examination: Secondary | ICD-10-CM | POA: Insufficient documentation

## 2016-10-17 DIAGNOSIS — C541 Malignant neoplasm of endometrium: Secondary | ICD-10-CM | POA: Insufficient documentation

## 2016-10-17 DIAGNOSIS — N950 Postmenopausal bleeding: Secondary | ICD-10-CM | POA: Diagnosis not present

## 2016-10-17 LAB — URINALYSIS, ROUTINE W REFLEX MICROSCOPIC
BACTERIA UA: NONE SEEN
Bilirubin Urine: NEGATIVE
GLUCOSE, UA: NEGATIVE mg/dL
KETONES UR: NEGATIVE mg/dL
Nitrite: NEGATIVE
Protein, ur: NEGATIVE mg/dL
Specific Gravity, Urine: 1.017 (ref 1.005–1.030)
pH: 6 (ref 5.0–8.0)

## 2016-10-17 LAB — COMPREHENSIVE METABOLIC PANEL
ALK PHOS: 72 U/L (ref 38–126)
ALT: 27 U/L (ref 14–54)
AST: 36 U/L (ref 15–41)
Albumin: 4.2 g/dL (ref 3.5–5.0)
Anion gap: 7 (ref 5–15)
BUN: 22 mg/dL — AB (ref 6–20)
CALCIUM: 9.2 mg/dL (ref 8.9–10.3)
CO2: 28 mmol/L (ref 22–32)
Chloride: 103 mmol/L (ref 101–111)
Creatinine, Ser: 1.2 mg/dL — ABNORMAL HIGH (ref 0.44–1.00)
GFR, EST AFRICAN AMERICAN: 50 mL/min — AB (ref >60)
GFR, EST NON AFRICAN AMERICAN: 43 mL/min — AB (ref >60)
GLUCOSE: 103 mg/dL — AB (ref 65–99)
Potassium: 3.8 mmol/L (ref 3.5–5.1)
Sodium: 138 mmol/L (ref 135–145)
TOTAL PROTEIN: 7 g/dL (ref 6.5–8.1)
Total Bilirubin: 0.5 mg/dL (ref 0.3–1.2)

## 2016-10-17 LAB — CBC WITH DIFFERENTIAL/PLATELET
Basophils Absolute: 0.1 10*3/uL (ref 0.0–0.1)
Basophils Relative: 2 %
EOS ABS: 0.2 10*3/uL (ref 0.0–0.7)
EOS PCT: 3 %
HCT: 35.9 % — ABNORMAL LOW (ref 36.0–46.0)
Hemoglobin: 12.5 g/dL (ref 12.0–15.0)
LYMPHS ABS: 2 10*3/uL (ref 0.7–4.0)
LYMPHS PCT: 26 %
MCH: 31.2 pg (ref 26.0–34.0)
MCHC: 34.8 g/dL (ref 30.0–36.0)
MCV: 89.5 fL (ref 78.0–100.0)
MONO ABS: 0.5 10*3/uL (ref 0.1–1.0)
MONOS PCT: 7 %
Neutro Abs: 4.9 10*3/uL (ref 1.7–7.7)
Neutrophils Relative %: 62 %
PLATELETS: 246 10*3/uL (ref 150–400)
RBC: 4.01 MIL/uL (ref 3.87–5.11)
RDW: 12.7 % (ref 11.5–15.5)
WBC: 7.8 10*3/uL (ref 4.0–10.5)

## 2016-10-17 NOTE — Progress Notes (Addendum)
10-17-16 CMP and UA results routed to Dr. Skeet Latch for review.

## 2016-10-21 NOTE — Anesthesia Preprocedure Evaluation (Signed)
Anesthesia Evaluation  Patient identified by MRN, date of birth, ID band Patient awake    Reviewed: Allergy & Precautions, H&P , Patient's Chart, lab work & pertinent test results, reviewed documented beta blocker date and time   Airway Mallampati: II  TM Distance: >3 FB Neck ROM: full    Dental no notable dental hx.    Pulmonary    Pulmonary exam normal breath sounds clear to auscultation       Cardiovascular hypertension,  Rhythm:regular Rate:Normal     Neuro/Psych    GI/Hepatic   Endo/Other    Renal/GU      Musculoskeletal   Abdominal   Peds  Hematology   Anesthesia Other Findings   Reproductive/Obstetrics                             Anesthesia Physical Anesthesia Plan  ASA: II  Anesthesia Plan: General   Post-op Pain Management:    Induction: Intravenous  Airway Management Planned: Oral ETT  Additional Equipment:   Intra-op Plan:   Post-operative Plan: Extubation in OR  Informed Consent: I have reviewed the patients History and Physical, chart, labs and discussed the procedure including the risks, benefits and alternatives for the proposed anesthesia with the patient or authorized representative who has indicated his/her understanding and acceptance.   Dental Advisory Given  Plan Discussed with: CRNA and Surgeon  Anesthesia Plan Comments: (  )        Anesthesia Quick Evaluation

## 2016-10-22 ENCOUNTER — Encounter (HOSPITAL_COMMUNITY): Payer: Self-pay | Admitting: *Deleted

## 2016-10-22 ENCOUNTER — Ambulatory Visit (HOSPITAL_COMMUNITY): Payer: Medicare HMO | Admitting: Anesthesiology

## 2016-10-22 ENCOUNTER — Encounter (HOSPITAL_COMMUNITY)
Admission: RE | Disposition: A | Discharge status: Home / Self Care | Payer: Self-pay | Source: Ambulatory Visit | Attending: Obstetrics & Gynecology

## 2016-10-22 ENCOUNTER — Ambulatory Visit (HOSPITAL_COMMUNITY)
Admission: RE | Admit: 2016-10-22 | Discharge: 2016-10-23 | Disposition: A | Discharge status: Home / Self Care | Payer: Medicare HMO | Source: Ambulatory Visit | Attending: Obstetrics & Gynecology | Admitting: Obstetrics & Gynecology

## 2016-10-22 DIAGNOSIS — Z96653 Presence of artificial knee joint, bilateral: Secondary | ICD-10-CM | POA: Diagnosis not present

## 2016-10-22 DIAGNOSIS — C55 Malignant neoplasm of uterus, part unspecified: Secondary | ICD-10-CM | POA: Diagnosis not present

## 2016-10-22 DIAGNOSIS — N840 Polyp of corpus uteri: Secondary | ICD-10-CM | POA: Diagnosis not present

## 2016-10-22 DIAGNOSIS — Z853 Personal history of malignant neoplasm of breast: Secondary | ICD-10-CM | POA: Insufficient documentation

## 2016-10-22 DIAGNOSIS — M1990 Unspecified osteoarthritis, unspecified site: Secondary | ICD-10-CM | POA: Diagnosis not present

## 2016-10-22 DIAGNOSIS — I10 Essential (primary) hypertension: Secondary | ICD-10-CM | POA: Insufficient documentation

## 2016-10-22 DIAGNOSIS — K668 Other specified disorders of peritoneum: Secondary | ICD-10-CM | POA: Diagnosis not present

## 2016-10-22 DIAGNOSIS — E785 Hyperlipidemia, unspecified: Secondary | ICD-10-CM | POA: Diagnosis not present

## 2016-10-22 DIAGNOSIS — C541 Malignant neoplasm of endometrium: Secondary | ICD-10-CM | POA: Diagnosis not present

## 2016-10-22 DIAGNOSIS — E7800 Pure hypercholesterolemia, unspecified: Secondary | ICD-10-CM | POA: Diagnosis not present

## 2016-10-22 DIAGNOSIS — E781 Pure hyperglyceridemia: Secondary | ICD-10-CM | POA: Diagnosis not present

## 2016-10-22 DIAGNOSIS — N888 Other specified noninflammatory disorders of cervix uteri: Secondary | ICD-10-CM | POA: Insufficient documentation

## 2016-10-22 DIAGNOSIS — Z9012 Acquired absence of left breast and nipple: Secondary | ICD-10-CM | POA: Insufficient documentation

## 2016-10-22 DIAGNOSIS — Z7982 Long term (current) use of aspirin: Secondary | ICD-10-CM | POA: Insufficient documentation

## 2016-10-22 DIAGNOSIS — K219 Gastro-esophageal reflux disease without esophagitis: Secondary | ICD-10-CM | POA: Diagnosis not present

## 2016-10-22 DIAGNOSIS — N800 Endometriosis of uterus: Secondary | ICD-10-CM | POA: Insufficient documentation

## 2016-10-22 DIAGNOSIS — N950 Postmenopausal bleeding: Secondary | ICD-10-CM | POA: Diagnosis not present

## 2016-10-22 DIAGNOSIS — E039 Hypothyroidism, unspecified: Secondary | ICD-10-CM | POA: Diagnosis not present

## 2016-10-22 DIAGNOSIS — Z79899 Other long term (current) drug therapy: Secondary | ICD-10-CM | POA: Insufficient documentation

## 2016-10-22 HISTORY — PX: SENTINEL NODE BIOPSY: SHX6608

## 2016-10-22 HISTORY — PX: ROBOTIC ASSISTED TOTAL HYSTERECTOMY WITH BILATERAL SALPINGO OOPHERECTOMY: SHX6086

## 2016-10-22 LAB — TYPE AND SCREEN
ABO/RH(D): A POS
Antibody Screen: NEGATIVE

## 2016-10-22 SURGERY — HYSTERECTOMY, TOTAL, ROBOT-ASSISTED, LAPAROSCOPIC, WITH BILATERAL SALPINGO-OOPHORECTOMY
Anesthesia: General

## 2016-10-22 MED ORDER — SUCCINYLCHOLINE CHLORIDE 200 MG/10ML IV SOSY
PREFILLED_SYRINGE | INTRAVENOUS | Status: AC
  Filled 2016-10-22: qty 10

## 2016-10-22 MED ORDER — ATORVASTATIN CALCIUM 20 MG PO TABS: 20.0000 mg | ORAL_TABLET | Freq: Every evening | ORAL | Status: DC

## 2016-10-22 MED ORDER — VALSARTAN-HYDROCHLOROTHIAZIDE 320-25 MG PO TABS: 1.0000 | ORAL_TABLET | Freq: Every day | ORAL | Status: DC

## 2016-10-22 MED ORDER — CEFAZOLIN SODIUM-DEXTROSE 2-4 GM/100ML-% IV SOLN
INTRAVENOUS | Status: AC
  Filled 2016-10-22: qty 100

## 2016-10-22 MED ORDER — LIDOCAINE 2% (20 MG/ML) 5 ML SYRINGE
INTRAMUSCULAR | Status: AC
  Filled 2016-10-22: qty 5

## 2016-10-22 MED ORDER — FENTANYL CITRATE (PF) 100 MCG/2ML IJ SOLN
25.0000 ug | INTRAMUSCULAR | Status: DC | PRN
  Administered 2016-10-22: 25 ug via INTRAVENOUS
  Administered 2016-10-22: 50 ug via INTRAVENOUS
  Administered 2016-10-22: 25 ug via INTRAVENOUS
  Administered 2016-10-22: 50 ug via INTRAVENOUS

## 2016-10-22 MED ORDER — STERILE WATER FOR IRRIGATION IR SOLN
Status: DC | PRN
  Administered 2016-10-22: 1000 mL

## 2016-10-22 MED ORDER — CITALOPRAM HYDROBROMIDE 20 MG PO TABS
20.0000 mg | ORAL_TABLET | Freq: Every day | ORAL | Status: DC
  Administered 2016-10-22: 20 mg via ORAL
  Filled 2016-10-22: qty 1

## 2016-10-22 MED ORDER — LIDOCAINE 2% (20 MG/ML) 5 ML SYRINGE
INTRAMUSCULAR | Status: DC | PRN
  Administered 2016-10-22: 100 mg via INTRAVENOUS

## 2016-10-22 MED ORDER — ENOXAPARIN SODIUM 40 MG/0.4ML ~~LOC~~ SOLN
40.0000 mg | SUBCUTANEOUS | Status: DC
  Administered 2016-10-23: 40 mg via SUBCUTANEOUS
  Filled 2016-10-22: qty 0.4

## 2016-10-22 MED ORDER — ROCURONIUM BROMIDE 50 MG/5ML IV SOSY
PREFILLED_SYRINGE | INTRAVENOUS | Status: AC
  Filled 2016-10-22: qty 5

## 2016-10-22 MED ORDER — PHENYLEPHRINE 40 MCG/ML (10ML) SYRINGE FOR IV PUSH (FOR BLOOD PRESSURE SUPPORT)
PREFILLED_SYRINGE | INTRAVENOUS | Status: DC | PRN
  Administered 2016-10-22: 80 ug via INTRAVENOUS

## 2016-10-22 MED ORDER — FENTANYL CITRATE (PF) 100 MCG/2ML IJ SOLN
INTRAMUSCULAR | Status: DC | PRN
  Administered 2016-10-22 (×6): 50 ug via INTRAVENOUS

## 2016-10-22 MED ORDER — FENTANYL CITRATE (PF) 100 MCG/2ML IJ SOLN
INTRAMUSCULAR | Status: AC
  Filled 2016-10-22: qty 2

## 2016-10-22 MED ORDER — DEXAMETHASONE SODIUM PHOSPHATE 10 MG/ML IJ SOLN
INTRAMUSCULAR | Status: DC | PRN
  Administered 2016-10-22: 10 mg via INTRAVENOUS

## 2016-10-22 MED ORDER — TRAMADOL HCL 50 MG PO TABS: 100.0000 mg | ORAL_TABLET | Freq: Four times a day (QID) | ORAL | Status: DC | PRN

## 2016-10-22 MED ORDER — SUGAMMADEX SODIUM 200 MG/2ML IV SOLN
INTRAVENOUS | Status: DC | PRN
  Administered 2016-10-22: 130 mg via INTRAVENOUS

## 2016-10-22 MED ORDER — ASPIRIN EC 81 MG PO TBEC
81.0000 mg | DELAYED_RELEASE_TABLET | Freq: Every day | ORAL | Status: DC
  Administered 2016-10-23: 81 mg via ORAL
  Filled 2016-10-22: qty 1

## 2016-10-22 MED ORDER — SUCCINYLCHOLINE CHLORIDE 200 MG/10ML IV SOSY
PREFILLED_SYRINGE | INTRAVENOUS | Status: DC | PRN
  Administered 2016-10-22: 100 mg via INTRAVENOUS

## 2016-10-22 MED ORDER — DEXAMETHASONE SODIUM PHOSPHATE 10 MG/ML IJ SOLN
INTRAMUSCULAR | Status: AC
  Filled 2016-10-22: qty 1

## 2016-10-22 MED ORDER — OXYCODONE-ACETAMINOPHEN 5-325 MG PO TABS: 1.0000 | ORAL_TABLET | ORAL | Status: DC | PRN

## 2016-10-22 MED ORDER — HYDROCHLOROTHIAZIDE 25 MG PO TABS
25.0000 mg | ORAL_TABLET | Freq: Every day | ORAL | Status: DC
  Administered 2016-10-22: 25 mg via ORAL
  Filled 2016-10-22: qty 1

## 2016-10-22 MED ORDER — LABETALOL HCL 5 MG/ML IV SOLN
INTRAVENOUS | Status: DC | PRN
  Administered 2016-10-22 (×4): 5 mg via INTRAVENOUS

## 2016-10-22 MED ORDER — HYDROMORPHONE HCL 2 MG/ML IJ SOLN
0.2000 mg | INTRAMUSCULAR | Status: DC | PRN
  Administered 2016-10-22: 0.6 mg via INTRAVENOUS
  Filled 2016-10-22: qty 1

## 2016-10-22 MED ORDER — PANTOPRAZOLE SODIUM 40 MG PO TBEC
40.0000 mg | DELAYED_RELEASE_TABLET | Freq: Every morning | ORAL | Status: DC
  Administered 2016-10-23: 40 mg via ORAL
  Filled 2016-10-22: qty 1

## 2016-10-22 MED ORDER — ROCURONIUM BROMIDE 10 MG/ML (PF) SYRINGE
PREFILLED_SYRINGE | INTRAVENOUS | Status: DC | PRN
  Administered 2016-10-22: 10 mg via INTRAVENOUS
  Administered 2016-10-22: 40 mg via INTRAVENOUS
  Administered 2016-10-22: 10 mg via INTRAVENOUS

## 2016-10-22 MED ORDER — LABETALOL HCL 5 MG/ML IV SOLN
INTRAVENOUS | Status: AC
  Filled 2016-10-22: qty 4

## 2016-10-22 MED ORDER — KCL IN DEXTROSE-NACL 20-5-0.45 MEQ/L-%-% IV SOLN
INTRAVENOUS | Status: DC
  Administered 2016-10-22: 50 mL/h via INTRAVENOUS
  Filled 2016-10-22 (×3): qty 1000

## 2016-10-22 MED ORDER — IBUPROFEN 800 MG PO TABS
800.0000 mg | ORAL_TABLET | Freq: Three times a day (TID) | ORAL | Status: DC | PRN
  Administered 2016-10-23: 800 mg via ORAL
  Filled 2016-10-22 (×2): qty 1

## 2016-10-22 MED ORDER — PROPOFOL 10 MG/ML IV BOLUS
INTRAVENOUS | Status: DC | PRN
  Administered 2016-10-22: 5 mg via INTRAVENOUS
  Administered 2016-10-22: 150 mg via INTRAVENOUS

## 2016-10-22 MED ORDER — ONDANSETRON HCL 4 MG/2ML IJ SOLN: 4.0000 mg | Freq: Four times a day (QID) | INTRAMUSCULAR | Status: DC | PRN

## 2016-10-22 MED ORDER — ONDANSETRON HCL 4 MG/2ML IJ SOLN
INTRAMUSCULAR | Status: DC | PRN
  Administered 2016-10-22: 4 mg via INTRAVENOUS

## 2016-10-22 MED ORDER — LACTATED RINGERS IV SOLN
INTRAVENOUS | Status: DC | PRN
  Administered 2016-10-22 (×3): via INTRAVENOUS

## 2016-10-22 MED ORDER — CEFAZOLIN SODIUM-DEXTROSE 2-4 GM/100ML-% IV SOLN
2.0000 g | INTRAVENOUS | Status: AC
  Administered 2016-10-22: 2 g via INTRAVENOUS

## 2016-10-22 MED ORDER — ONDANSETRON HCL 4 MG PO TABS: 4.0000 mg | ORAL_TABLET | Freq: Four times a day (QID) | ORAL | Status: DC | PRN

## 2016-10-22 MED ORDER — IRBESARTAN 300 MG PO TABS
300.0000 mg | ORAL_TABLET | Freq: Every day | ORAL | Status: DC
  Administered 2016-10-22: 300 mg via ORAL
  Filled 2016-10-22: qty 1

## 2016-10-22 MED ORDER — LEVOTHYROXINE SODIUM 100 MCG PO TABS
100.0000 ug | ORAL_TABLET | Freq: Every day | ORAL | Status: DC
  Administered 2016-10-23: 100 ug via ORAL
  Filled 2016-10-22: qty 1

## 2016-10-22 MED ORDER — STERILE WATER FOR INJECTION IJ SOLN
INTRAMUSCULAR | Status: AC
  Filled 2016-10-22: qty 10

## 2016-10-22 MED ORDER — PROPOFOL 10 MG/ML IV BOLUS
INTRAVENOUS | Status: AC
  Filled 2016-10-22: qty 20

## 2016-10-22 MED ORDER — GABAPENTIN 300 MG PO CAPS
600.0000 mg | ORAL_CAPSULE | Freq: Every day | ORAL | Status: AC
  Administered 2016-10-22: 600 mg via ORAL
  Filled 2016-10-22: qty 2

## 2016-10-22 MED ORDER — ONDANSETRON HCL 4 MG/2ML IJ SOLN
INTRAMUSCULAR | Status: AC
  Filled 2016-10-22: qty 2

## 2016-10-22 MED ORDER — ENOXAPARIN SODIUM 40 MG/0.4ML ~~LOC~~ SOLN
40.0000 mg | SUBCUTANEOUS | Status: AC
  Administered 2016-10-22: 40 mg via SUBCUTANEOUS
  Filled 2016-10-22: qty 0.4

## 2016-10-22 MED ORDER — RINGERS IRRIGATION IR SOLN
Status: DC | PRN
  Administered 2016-10-22: 1

## 2016-10-22 SURGICAL SUPPLY — 51 items
ADH SKN CLS APL DERMABOND .7 (GAUZE/BANDAGES/DRESSINGS) ×1
BAG SPEC RTRVL LRG 6X4 10 (ENDOMECHANICALS) ×2
CHLORAPREP W/TINT 26ML (MISCELLANEOUS) ×2 IMPLANT
COVER SURGICAL LIGHT HANDLE (MISCELLANEOUS) ×2 IMPLANT
COVER TIP SHEARS 8 DVNC (MISCELLANEOUS) ×1 IMPLANT
COVER TIP SHEARS 8MM DA VINCI (MISCELLANEOUS) ×1
DERMABOND ADVANCED (GAUZE/BANDAGES/DRESSINGS) ×1
DERMABOND ADVANCED .7 DNX12 (GAUZE/BANDAGES/DRESSINGS) ×1 IMPLANT
DRAPE ARM DVNC X/XI (DISPOSABLE) ×4 IMPLANT
DRAPE COLUMN DVNC XI (DISPOSABLE) ×1 IMPLANT
DRAPE DA VINCI XI ARM (DISPOSABLE) ×4
DRAPE DA VINCI XI COLUMN (DISPOSABLE) ×1
DRAPE SHEET LG 3/4 BI-LAMINATE (DRAPES) ×4 IMPLANT
DRAPE SURG IRRIG POUCH 19X23 (DRAPES) ×2 IMPLANT
ELECT REM PT RETURN 15FT ADLT (MISCELLANEOUS) ×2 IMPLANT
GLOVE BIO SURGEON STRL SZ 6.5 (GLOVE) ×4 IMPLANT
GLOVE BIO SURGEON STRL SZ7.5 (GLOVE) ×6 IMPLANT
GLOVE INDICATOR 8.0 STRL GRN (GLOVE) ×4 IMPLANT
GOWN STRL NON-REIN LRG LVL3 (GOWN DISPOSABLE) ×2 IMPLANT
GOWN STRL REUS W/TWL XL LVL3 (GOWN DISPOSABLE) ×4 IMPLANT
HOLDER FOLEY CATH W/STRAP (MISCELLANEOUS) ×2 IMPLANT
IRRIG SUCT STRYKERFLOW 2 WTIP (MISCELLANEOUS) ×2
IRRIGATION SUCT STRKRFLW 2 WTP (MISCELLANEOUS) ×1 IMPLANT
KIT BASIN OR (CUSTOM PROCEDURE TRAY) ×2 IMPLANT
KIT PROCEDURE DA VINCI SI (MISCELLANEOUS) ×1
KIT PROCEDURE DVNC SI (MISCELLANEOUS) ×1 IMPLANT
MANIPULATOR UTERINE 4.5 ZUMI (MISCELLANEOUS) ×2 IMPLANT
MARKER SKIN DUAL TIP RULER LAB (MISCELLANEOUS) ×2 IMPLANT
OCCLUDER COLPOPNEUMO (BALLOONS) ×2 IMPLANT
PAD POSITIONING PINK XL (MISCELLANEOUS) ×2 IMPLANT
PORT ACCESS TROCAR AIRSEAL 12 (TROCAR) ×1 IMPLANT
PORT ACCESS TROCAR AIRSEAL 5M (TROCAR) ×1
POUCH SPECIMEN RETRIEVAL 10MM (ENDOMECHANICALS) ×4 IMPLANT
SEAL CANN UNIV 5-8 DVNC XI (MISCELLANEOUS) ×4 IMPLANT
SEAL XI 5MM-8MM UNIVERSAL (MISCELLANEOUS) ×4
SET TRI-LUMEN FLTR TB AIRSEAL (TUBING) ×2 IMPLANT
SHEET LAVH (DRAPES) ×2 IMPLANT
SOLUTION ELECTROLUBE (MISCELLANEOUS) ×2 IMPLANT
SUT MNCRL AB 4-0 PS2 18 (SUTURE) ×4 IMPLANT
SUT VIC AB 0 CT1 27 (SUTURE) ×2
SUT VIC AB 0 CT1 27XBRD ANTBC (SUTURE) ×1 IMPLANT
SUT VLOC 180 0 9IN  GS21 (SUTURE)
SUT VLOC 180 0 9IN GS21 (SUTURE) IMPLANT
SYR 50ML LL SCALE MARK (SYRINGE) ×2 IMPLANT
TOWEL OR 17X26 10 PK STRL BLUE (TOWEL DISPOSABLE) ×2 IMPLANT
TOWEL OR NON WOVEN STRL DISP B (DISPOSABLE) ×2 IMPLANT
TRAP SPECIMEN MUCOUS 40CC (MISCELLANEOUS) IMPLANT
TRAY LAPAROSCOPIC (CUSTOM PROCEDURE TRAY) ×2 IMPLANT
TROCAR BLADELESS OPT 5 100 (ENDOMECHANICALS) ×2 IMPLANT
UNDERPAD 30X30 INCONTINENT (UNDERPADS AND DIAPERS) ×2 IMPLANT
WATER STERILE IRR 1500ML POUR (IV SOLUTION) ×4 IMPLANT

## 2016-10-22 NOTE — Interval H&P Note (Signed)
History and Physical Interval Note:  10/22/2016 7:28 AM  Vicki Cervantes  has presented today for surgery, with the diagnosis of POSTMENOPAUSAL BLEEDING, ENDOMETRIAL CANCER  The various methods of treatment have been discussed with the patient and family. After consideration of risks, benefits and other options for treatment, the patient has consented to  Procedure(s): XI ROBOTIC ASSISTED TOTAL HYSTERECTOMY WITH BILATERAL SALPINGO OOPHORECTOMY (N/A) SENTINEL NODE BIOPSY (N/A) as a surgical intervention .  The patient's history has been reviewed, patient examined, no change in status, stable for surgery.  I have reviewed the patient's chart and labs.  Questions were answered to the patient's satisfaction.     [place], [facility]

## 2016-10-22 NOTE — H&P (View-Only) (Signed)
Consult Note: Gyn-Onc  Consult was requested by Dr. Stann Mainland for the evaluation of Vicki Cervantes 76 y.o. female  CC:  Chief Complaint  Patient presents with  . PMB    Assessment/Plan:  Vicki Cervantes  is a 76 y.o. 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  A detailed discussion was held with the patient and her family with regard to to her endometrial cancer diagnosis. We discussed the standard management options for uterine cancer which includes surgery followed possibly by adjuvant therapy depending on the results of surgery. The options for surgical management include a hysterectomy and removal of the tubes and ovaries possibly with removal of pelvic and para-aortic lymph nodes.If feasible, a minimally invasive approach including a robotic hysterectomy or laparoscopic hysterectomy have benefits including shorter hospital stay, recovery time and better wound healing than with open surgery. The patient has been counseled about these surgical options and the risks of surgery in general including infection, bleeding, damage to surrounding structures (including bowel, bladder, ureters, nerves or vessels), and the postoperative risks of PE/ DVT, and lymphedema. I extensively reviewed the additional risks of robotic hysterectomy including possible need for conversion to open laparotomy.  I discussed positioning during surgery of trendelenberg and risks of minor facial swelling and care we take in preoperative positioning.  After counseling and consideration of her options, she desires to proceed with robotic assisted total hysterectomy with bilateral sapingo-oophorectomy and SLN biopsy.  I discussed that she is likely to require adjuvant therapy post-op due to the high grade nature of her disease.   She will be seen by anesthesia for preoperative clearance and discussion of postoperative pain management.  She was given the opportunity to ask questions, which were answered to her satisfaction, and she  is agreement with the above mentioned plan of care.  I have recommended she stop ASA now preop as this increases her bleeding risk.  We will obtain preoperative CT abd/pelvis/chest to evaluate for metastatic disease.  HPI: Vicki Cervantes is a 76 year old parous woman who is seen in consultation at the request of Dr Stann Mainland for high grade serous endometrial cancer.  The patient reports post menopausal spotting since December 2017. She reported this to Dr Stann Mainland who performed an [place] which showed a normal sized uterus but with a thickened endometrial lining. Endometrial biopsy was taken on 08/29/16 which showed a high grade endometrial cancer, favor serous morphology.  The patient is otherwise fairly healthy. She has had 2 prior vaginal deliveries. She has a remote history of breast cancer at age 69 but received BRCA testing approximately 5 years ago which was negative. Her cancer was treated with surgery but no radiation or chemotherapy.   She has a history of a laparotomy for endometriosis which involved a unilateral salpingo-oophorectomy and possible partial oophorectomy on the contralateral side (>30 years ago). She is unclear which side was removed.    She takes aspirin 81 mg daily.  Current Meds:  Outpatient Encounter Prescriptions as of 09/30/2016  Medication Sig  . aspirin EC 81 MG tablet Take 81 mg by mouth daily.  Marland Kitchen atorvastatin (LIPITOR) 20 MG tablet Take 20 mg by mouth every evening.   . Calcium Carbonate-Vitamin D (CALCIUM-VITAMIN D) 500-200 MG-UNIT per tablet Take 1 tablet by mouth daily.  . celecoxib (CELEBREX) 200 MG capsule Take 200 mg by mouth daily.   . citalopram (CELEXA) 20 MG tablet Take 20 mg by mouth at bedtime.   Marland Kitchen levothyroxine (SYNTHROID, LEVOTHROID) 100 MCG  tablet Take 100 mcg by mouth daily before breakfast.   . Multiple Vitamin (MULTIVITAMIN WITH MINERALS) TABS Take 1 tablet by mouth daily.  . pantoprazole (PROTONIX) 40 MG tablet Take 40 mg by mouth every morning.   .  raloxifene (EVISTA) 60 MG tablet Take 60 mg by mouth daily.    . valsartan-hydrochlorothiazide (DIOVAN-HCT) 320-25 MG per tablet Take 1 tablet by mouth at bedtime.    No facility-administered encounter medications on file as of 09/30/2016.     Allergy: No Known Allergies  Social Hx:   Social History   Social History  . Marital status: Widowed    Spouse name: N/A  . Number of children: 3  . Years of education: N/A   Occupational History  . 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 Smoking status: Never Smoker  . Smokeless tobacco: Never Used  . Alcohol use 0.0 oz/week     Comment: Wine Occas.  . Drug use: No  . Sexual activity: Yes   Other Topics Concern  . Not on file   Social History Narrative  . No narrative on file    Past Surgical Hx:  Past Surgical History:  Procedure Laterality Date  . EUS N/A 01/07/2013   Procedure: UPPER ENDOSCOPIC ULTRASOUND (EUS) LINEAR;  Surgeon: Milus Banister, MD;  Location: [facility] ENDOSCOPY;  Service: Endoscopy;  Laterality: N/A;  . EUS N/A 01/05/2015   Procedure: UPPER ENDOSCOPIC ULTRASOUND (EUS) LINEAR;  Surgeon: Milus Banister, MD;  Location: [facility] ENDOSCOPY;  Service: Endoscopy;  Laterality: N/A;  . FOOT SURGERY Left 2009-2010   x2. Surgery in 2011  . LEFT HEART CATHETERIZATION WITH CORONARY ANGIOGRAM N/A 01/24/2014   Procedure: LEFT HEART CATHETERIZATION WITH CORONARY ANGIOGRAM;  Surgeon: Peter M Martinique, MD;  Location: [facility] CATH LAB;  Service: Cardiovascular;  Laterality: N/A;  . LUMBAR [place] SURGERY  01-01-13  . MASTECTOMY, RADICAL Left 1982  . OVARY SURGERY  1972-1973  . ROTATOR CUFF REPAIR Left   . SIMPLE MASTECTOMY Left 1982  . TOTAL KNEE ARTHROPLASTY Bilateral 2009-2010   x2    Past Medical Hx:  Past Medical History:  Diagnosis Date  . Arthritis   . Breast cancer ([place])     LEFT, '82-left breast cancer-surgery only  . Complication of anesthesia    nausea  . Diverticulosis   . Esophageal stricture    . GERD (gastroesophageal reflux disease)   . Hemorrhoids   . Hiatal hernia   . High triglycerides   . Hypercholesteremia   . Hypertension   . Hypothyroidism   . Osteoarthritis   . PONV (postoperative nausea and vomiting)     Past Gynecological History:  G2P2 No LMP recorded. Patient is postmenopausal.  Family Hx:  Family History  Problem Relation Age of Onset  . Heart disease Mother   . Heart attack Father   . Heart attack Brother     Review of Systems:  Constitutional  Feels well,    ENT Normal appearing ears and nares bilaterally Skin/Breast  No rash, sores, jaundice, itching, dryness Cardiovascular  No chest pain, shortness of breath, or edema  Pulmonary  No cough or wheeze.  Gastro Intestinal  No nausea, vomitting, or diarrhoea. No bright red blood per rectum, no abdominal pain, change in bowel movement, or constipation.  Genito Urinary  No frequency, urgency, dysuria, +postmenopausal bleeding Musculo Skeletal  No myalgia, arthralgia, joint swelling or pain  Neurologic  No weakness, numbness, change in gait,  Psychology  No depression, anxiety, insomnia.   Vitals:  Blood pressure (!) 155/70, pulse 80, temperature 98 F (36.7 C), temperature source Oral, resp. rate 18, height '5\' 1"'  (1.549 m), weight 138 lb 4.8 oz (62.7 kg), SpO2 97 %.  Physical Exam: WD in NAD Neck  Supple NROM, without any enlargements.  Lymph Node Survey No cervical supraclavicular or inguinal adenopathy Cardiovascular  Pulse normal rate, regularity and rhythm. S1 and S2 normal.  Lungs  Clear to auscultation bilateraly, without wheezes/crackles/rhonchi. Good air movement.  Skin  No rash/lesions/breakdown  Psychiatry  Alert and oriented to person, place, and time  Abdomen  Normoactive bowel sounds, abdomen soft, non-tender and overweight without evidence of hernia.  Back No CVA tenderness Genito Urinary  Vulva/vagina: Normal external female genitalia.  No lesions. No discharge  or bleeding.  Bladder/urethra:  No lesions or masses, well supported bladder  Vagina: normal  Cervix: Normal appearing, no lesions.  Uterus:  Small, mobile, no parametrial involvement or nodularity.  Adnexa: no palpable masses. Rectal  deferred Extremities  No bilateral cyanosis, clubbing or edema.   Donaciano Eva, MD  09/30/2016, 10:02 AM

## 2016-10-22 NOTE — Anesthesia Procedure Notes (Signed)
Procedure Name: Intubation Date/Time: 10/22/2016 7:38 AM Performed by: Lind Covert Pre-anesthesia Checklist: Patient identified, Emergency Drugs available, Suction available, Patient being monitored and Timeout performed Patient Re-evaluated:Patient Re-evaluated prior to inductionOxygen Delivery Method: Circle system utilized Preoxygenation: Pre-oxygenation with 100% oxygen Intubation Type: IV induction Ventilation: Mask ventilation without difficulty Laryngoscope Size: Mac and 3 Grade View: Grade I Tube type: Oral (intubated per [facility] paramedic student) Tube size: 7.0 mm Number of attempts: 1 Airway Equipment and Method: Stylet Placement Confirmation: ETT inserted through vocal cords under direct vision,  positive ETCO2 and breath sounds checked- equal and bilateral Secured at: 21 cm Tube secured with: Tape Dental Injury: Teeth and Oropharynx as per pre-operative assessment

## 2016-10-22 NOTE — Anesthesia Postprocedure Evaluation (Signed)
Anesthesia Post Note  Patient: Vicki Cervantes  Procedure(s) Performed: Procedure(s) (LRB): XI ROBOTIC ASSISTED TOTAL HYSTERECTOMY WITH RIGHT SALPINGO OOPHORECTOMY; INJECTION OF GREEN DYE WITH EXCISION OF BILATERAL PELVIC LYMPH NODES (N/A) SENTINEL NODE BIOPSY (N/A)  Patient location during evaluation: PACU Anesthesia Type: General Level of consciousness: awake and alert Pain management: pain level controlled Vital Signs Assessment: post-procedure vital signs reviewed and stable Respiratory status: spontaneous breathing, nonlabored ventilation, respiratory function stable and patient connected to nasal cannula oxygen Cardiovascular status: blood pressure returned to baseline and stable Postop Assessment: no signs of nausea or vomiting Anesthetic complications: no       Last Vitals:  Vitals:   10/22/16 1437 10/22/16 1538  BP: (!) 150/72 (!) 145/72  Pulse: (!) 103 (!) 105  Resp: 16 16  Temp: 36.9 C 37.2 C    Last Pain:  Vitals:   10/22/16 1538  TempSrc: Oral  PainSc:                  Riccardo Dubin

## 2016-10-22 NOTE — Op Note (Signed)
OPERATIVE REPORT  10/22/16   Preoperative Diagnosis: Uterine serous cancer  Postoperative Diagnosis:Uterine serous cancer  Procedure(s) Performed: Robotic total laparoscopic hysterectomy, Injection of dye for sentinel LND, Right salpingo oophorectomy,  Bilateral pelvic lymph node dissection, adhesiolysis  Anesthesia: GET  Surgeon: Francetta Found.  Skeet Latch, M.D. PhD  Assistant Surgeon: Lahoma Crocker MD.   Specimens: Uterus cervix, right ovary and tube, bilateral pelvic LN, 78mm cul de sac biopsy  Estimated Blood Loss: 18mL.   Complications:None  Indication for Procedure: This is a 76 y.o. who underwent prior EMB  Demonstrating uterine serous endometrial cancer.  Operative Findings:  7cm uterus, absent left adnexa,  Omentum adherent to the anterior abdominal wall, left pelvic adhesive disease, omentum adherent to the right adnexa adnexa.cul de sac adhesions, 44mm nodule versus scar int he cul de sac. Non mapping of B sentinel PLN  Procedure: Patient was taken to the operating room and placed under general endotracheal anesthesia without any difficulty. She is placed in the dorsal lithotomy position and secured to the operative table over the chest with tape.   The patient was prepped and draped and the uterine manipulator placed within the endometrial cavity. The appropriately sized Koh ring was circumferentially around the cervix. The balloon was placed within the vagina. An OG tube was present and functional. At an area on the left in line with the nipple approximately 2 cm below the ribs the area was infiltrated with 1% lidocaine and a 5 mm Optiview inserted under direct visualization. The abdomen was insufflated to 15 mm of mercury and the pressure never deviated above that throughout the remainder of the procedure. Maximum Trendelenburg positioning was obtained. At approximately 22 cm proximal to the symphysis pubis an incision was made just superior to the umbilicus. Incisions were  made 8cm  and 16 cm lateral to this incision.  A10 mm trocar was inserted in the superior umbilicus incision. Millimeter robotic ports were placed in the other 3 incisions.  The omentum was dissected free from the anterior abdominal wall with cautery.  The left upper quadrant port site was replaced with a 10 mm port. This was all completed under direct visualization. The small and large bowel were reflected as much as possible into the upper abdomen. The robot was docked and instruments placed.  The right round ligament was transected and the ureter was identified. The paravesical and para rectal spaces were dissected.  A SNL was not identified. The left pelvic adhesions were dissected.  Similarly on the left the paravesical and pararectal spaces were developed.  SLN was not identified.  The B common iliac vessels were evaluated as was the presacral space and no SLN were identified.   The right infundibulopelvic ligament was cauterized and transected The retroperitoneal space was entered on the right and the peritoneum incised to the level of the vesicouterine ligament anteriorly. The bladder flap was created using Bovie cautery. The peritoneal dissection was continued inferiorly and across the inferior most aspect of the cervix. In this manner the urethra was deflected inferiorly. The bladder flap was further developed. The uterine vessels on the right were skeletonized ligated and transected.  The left ureter was identified. The left gonadal vessels were cauterized and transected. The broad ligament was skeletonized posteriorly to the level of the cervix and the peritoneum dissected free from the cervix and in this fashion the ureter was deflected inferiorly. The anterior peritoneum was further dissected and the bladder flap appropriately developed. The uterine vessels were skeletonized cauterized and  transected. The balloon and the vagina was then maximally insufflated. A colpotomy incision was made  circumferentially and the uterus cervix ovaries and tubes were delivered from the vagina. The Koh ring was removed and the balloon was replaced.  Right pelvic lymph node dissection was then initiated. The superior vesicle artery was identified and the vesicouterine space developed. The obturator nerve was identified. Nodal tissue was removed within the boundaries of the right genitofemoral nerve the right circumflex vein, the ureter and the superior vesicle artery.  The nodal tissue was placed in an Endo Catch bag. The left pelvic lymph node dissection was then initiated. The superior vesicle artery was identified and the vesicouterine space developed. The obturator nerve was identified. Nodal tissue was removed within the boundaries of the left  genitofemoral nerve the left circumflex vein, the ureter and the superior vesicle artery. The nodal tissue was placed in an Endo Catch bag.    The specimens were removed through the vagina. The vaginal balloon was reinserted.  The pelvis was copiously irrigated and drained and hemostasis was assured. The vaginal cuff was closed with a running 0 Vicryl suture ligature. The needle was removed under direct visualization.  A nodule was appreciated deep in the cul de sac and was removed.  The operative site is once again visualized and hemostasis was assured. The instruments were removed from the abdomen and pelvis and the port sites irrigated. The umbilical fascia was closed with an interrupted 0 Vicryl  suture. The subcutaneous tissue of the umbilical left upper quadrant and right lower quadrant subcutaneous tissues were approximated with a single suture. Skin incisions were closed with a subcuticular suture.  The vaginal vault was cleared with a moist sponge stick.  Sponge, lap and needle counts were correct x 3.    The patient had sequential compression devices and preoperative Lovenox for VTE prophylaxis and will receive Lovenox postoperatively.           Disposition: PACU - hemodynamically stable.         Condition:stable Foley draining clear urine.

## 2016-10-22 NOTE — Transfer of Care (Signed)
Immediate Anesthesia Transfer of Care Note  Patient: Vicki Cervantes  Procedure(s) Performed: Procedure(s): XI ROBOTIC ASSISTED TOTAL HYSTERECTOMY WITH RIGHT SALPINGO OOPHORECTOMY; INJECTION OF GREEN DYE WITH EXCISION OF BILATERAL PELVIC LYMPH NODES (N/A) SENTINEL NODE BIOPSY (N/A)  Patient Location: PACU  Anesthesia Type:General  Level of Consciousness: sedated  Airway & Oxygen Therapy: Patient Spontanous Breathing and Patient connected to face mask oxygen  Post-op Assessment: Report given to RN and Post -op Vital signs reviewed and stable  Post vital signs: Reviewed and stable  Last Vitals:  Vitals:   10/22/16 0541  BP: (!) 155/75  Pulse: 84  Resp: 18  Temp: 36.4 C    Last Pain:  Vitals:   10/22/16 0615  TempSrc:   PainSc: 0-No pain      Patients Stated Pain Goal: 3 (86/16/83 7290)  Complications: No apparent anesthesia complications

## 2016-10-23 DIAGNOSIS — N888 Other specified noninflammatory disorders of cervix uteri: Secondary | ICD-10-CM | POA: Diagnosis not present

## 2016-10-23 DIAGNOSIS — N840 Polyp of corpus uteri: Secondary | ICD-10-CM | POA: Diagnosis not present

## 2016-10-23 DIAGNOSIS — Z7982 Long term (current) use of aspirin: Secondary | ICD-10-CM | POA: Diagnosis not present

## 2016-10-23 DIAGNOSIS — C541 Malignant neoplasm of endometrium: Secondary | ICD-10-CM | POA: Diagnosis not present

## 2016-10-23 DIAGNOSIS — Z9012 Acquired absence of left breast and nipple: Secondary | ICD-10-CM | POA: Diagnosis not present

## 2016-10-23 DIAGNOSIS — Z853 Personal history of malignant neoplasm of breast: Secondary | ICD-10-CM | POA: Diagnosis not present

## 2016-10-23 DIAGNOSIS — Z79899 Other long term (current) drug therapy: Secondary | ICD-10-CM | POA: Diagnosis not present

## 2016-10-23 DIAGNOSIS — M1990 Unspecified osteoarthritis, unspecified site: Secondary | ICD-10-CM | POA: Diagnosis not present

## 2016-10-23 DIAGNOSIS — N800 Endometriosis of uterus: Secondary | ICD-10-CM | POA: Diagnosis not present

## 2016-10-23 DIAGNOSIS — Z96653 Presence of artificial knee joint, bilateral: Secondary | ICD-10-CM | POA: Diagnosis not present

## 2016-10-23 LAB — CBC
HCT: 28.4 % — ABNORMAL LOW (ref 36.0–46.0)
Hemoglobin: 9.8 g/dL — ABNORMAL LOW (ref 12.0–15.0)
MCH: 31.1 pg (ref 26.0–34.0)
MCHC: 34.5 g/dL (ref 30.0–36.0)
MCV: 90.2 fL (ref 78.0–100.0)
PLATELETS: 200 10*3/uL (ref 150–400)
RBC: 3.15 MIL/uL — ABNORMAL LOW (ref 3.87–5.11)
RDW: 12.9 % (ref 11.5–15.5)
WBC: 10.7 10*3/uL — ABNORMAL HIGH (ref 4.0–10.5)

## 2016-10-23 LAB — BASIC METABOLIC PANEL
Anion gap: 10 (ref 5–15)
BUN: 16 mg/dL (ref 6–20)
CALCIUM: 8.1 mg/dL — AB (ref 8.9–10.3)
CHLORIDE: 100 mmol/L — AB (ref 101–111)
CO2: 24 mmol/L (ref 22–32)
CREATININE: 1.18 mg/dL — AB (ref 0.44–1.00)
GFR calc Af Amer: 51 mL/min — ABNORMAL LOW (ref >60)
GFR, EST NON AFRICAN AMERICAN: 44 mL/min — AB (ref >60)
Glucose, Bld: 127 mg/dL — ABNORMAL HIGH (ref 65–99)
Potassium: 3.9 mmol/L (ref 3.5–5.1)
SODIUM: 134 mmol/L — AB (ref 135–145)

## 2016-10-23 MED ORDER — OXYCODONE-ACETAMINOPHEN 5-325 MG PO TABS: 1.0000 | ORAL_TABLET | ORAL | 0 refills | Status: DC | PRN

## 2016-10-23 NOTE — Discharge Instructions (Signed)
° °  Activity: 1. Be up and out of the bed during the day.  Take a nap if needed.  You may walk up steps but be careful and use the hand rail.  Stair climbing will tire you more than you think, you may need to stop part way and rest.   2. No lifting or straining for 6 weeks.  3. No driving for 1-2 weeks.  Do Not drive if you are taking narcotic pain medicine.  4. Shower daily.  Use soap and water on your incision and pat dry; don't rub.   5. No sexual activity and nothing in the vagina for 4 weeks.  Diet: 1. Low sodium Heart Healthy Diet is recommended.  2. It is safe to use a laxative if you have difficulty moving your bowels.   Wound Care: 1. Keep clean and dry.  Shower daily.  Reasons to call the Doctor:   Fever - Oral temperature greater than 100.4 degrees Fahrenheit  Foul-smelling vaginal discharge  Difficulty urinating  Nausea and vomiting  Increased pain at the site of the incision that is unrelieved with pain medicine.  Difficulty breathing with or without chest pain  New calf pain especially if only on one side  Sudden, continuing increased vaginal bleeding with or without clots.   Follow-up: 1. See Everitt Amber in 4 weeks.  Contacts: For questions or concerns you should contact:  Dr. Everitt Amber at [phone]  or at [place]

## 2016-10-23 NOTE — Progress Notes (Signed)
Reviewed d/c instruction with pt and daughter at bedside. IV removed; Rx given. Pt given dose of pain medication to help with transport discomfort. Pt and personal belonging wheeled to front entrance.

## 2016-10-23 NOTE — Discharge Summary (Signed)
Physician Discharge Summary  Patient ID: Vicki Cervantes MRN: [id number] DOB/AGE: March 04, 1941 76 y.o.  Admit date: 10/22/2016 Discharge date: 10/23/2016  Admission Diagnoses: Endometrial cancer Discharge Diagnoses:  Active Problems:   Endometrial cancer [facility])   Discharged Condition: good  Hospital Course: On 10/22/2016, the patient underwent the following: Procedure(s): XI ROBOTIC ASSISTED TOTAL HYSTERECTOMY WITH RIGHT SALPINGO OOPHORECTOMY; INJECTION OF GREEN DYE WITH EXCISION OF BILATERAL PELVIC LYMPH NODES SENTINEL NODE BIOPSY.   The postoperative course was uneventful.  She was discharged to home on postoperative day 1 tolerating a regular diet.  Consults: None  Significant Diagnostic Studies: None  Treatments: surgery: see above  Discharge Exam: Blood pressure 110/61, pulse 72, temperature 98.5 F (36.9 C), temperature source Oral, resp. rate 16, height 5\' 2"  (1.575 m), weight 140 lb (63.5 kg), SpO2 98 %. General appearance: alert Resp: clear to auscultation bilaterally Cardio: regular rate and rhythm, S1, S2 normal, no murmur, click, rub or gallop GI: soft, non-tender; bowel sounds normal; no masses,  no organomegaly Extremities: extremities normal, atraumatic, no cyanosis or edema Incision/Wound: C/D/I  Disposition: 01-Home or Self Care  Discharge Instructions    Call MD for:  extreme fatigue    Complete by:  As directed    Call MD for:  persistant dizziness or light-headedness    Complete by:  As directed    Call MD for:  persistant nausea and vomiting    Complete by:  As directed    Call MD for:  redness, tenderness, or signs of infection (pain, swelling, redness, odor or green/yellow discharge around incision site)    Complete by:  As directed    Call MD for:  severe uncontrolled pain    Complete by:  As directed    Call MD for:  temperature >100.4    Complete by:  As directed    Diet - low sodium heart healthy    Complete by:  As directed    Diet Carb  Modified    Complete by:  As directed    Diet general    Complete by:  As directed    Discharge instructions    Complete by:  As directed    Planning for Recovery and Going Home Your Guide to Gynecologic Surgery    In-Hospital Recovery Plan Team Caring for You After Surgery In addition to the nursing staff on the unit, the gynecological surgery team will care for you. This team is led by your surgeon and includes a resident in his last year of training, as well as other residents, medical students and a physician assistant or nurse practitioner. There will be a physician in the hospital 24 hours a day to tend to your needs. The residents and students report directly to your surgeon, who is the one overseeing all of your care.  Pain Relief After Surgery Your pain will be assessed regularly on a scale from 0 to 10. Pain assessment is  necessary to guide your pain relief. It is essential that you are able to take deep breaths, cough and move. Prevention or early treatment of pain is far more effective than trying to treat severe pain. Therefore, we have devised a specialized regimen to stay ahead of your pain and use almost no narcotics, which can slow down your recovery process. If you have an epidural catheter, you will receive a  constant infusion of pain medication through your epidural. If you need additional pain relief, you will be able to push a button to increase  the medication in your epidural. You will also be given acetaminophen and an ibuprofen-like medication to keep your pain under control.  You can always ask for additional pain pills if you are not comfortable. In most cases an anesthesiologist with expertise in pain management will visit you every day and help design your pain management plan.  One Day After Surgery Focus on drinking and walking. You will start drinking clear liquids after surgery. The intravenous fluids will be stopped, and the catheter may be  removed  from your bladder. We expect you to get out of bed, with the nurses' or assistants' help, sit in a chair for six hours and start to move about in the hallways. You will also meet with a case manager to assess your discharge needs, including home nursing. Your physician may order home care to assist with your transition home.  Home nursing visits, which are intermittent, help you get readjusted to home by teaching treatments, monitoring medications, and performing clinical assessment and reporting back to your physician. Other services may include therapy and medical equipment; private duty services are also available. If you are going "home" to a different address upon discharge, please alert [place]. A Home Care Coordinator can visit with you while in the hospital to discuss your options. If you have questions please speak with your case manager. If you need rehabilitation at a facility, a social worker will assist with this. If you need rehabilitation at a facility, a social worker will assist with this. If your procedure was performed in a minimally invasive fashion, you will be discharged to home if your pain is well controlled and you are tolerating a regular diet.     Two Days After Surgery You will start eating a soft diet and change to a more solid diet as you feel up to it. The catheter from your bladder will be removed, if not already done so. If there is a dressing on your wound, it will be removed. The tubing will be disconnected from your IV. We expect you to be out of bed for the majority of the day and walking at least three times in the hallway, with assistance as needed.  You may be discharged at this point if it is felt you are ready.   Three Days After Surgery You continue to eat your low residue diet. You may be ready to go home if you are drinking enough to keep yourself hydrated, your pain is well controlled, you are not belching or nauseated, you are passing gas and  you are able to get around on your own. However, we will not discharge you from the hospital until we are sure you are ready.  Discharge Discharge time is at 10 a.m. You will need to make arrangements for someone to accompany you home. You will not be released without someone present. Please keep in mind that we strive to get patients discharged as quickly as possible, but there may be delays for a variety of reasons. Complications That May Delay Discharge: ? Nausea and vomiting: It is very common to feel sick after your surgery. We give you medication to reduce this. However, if you do feel sick, you should reduce the amount you are taking by mouth. Small, frequent meals or drinks are best in this  situation. As long as you can drink and keep yourself hydrated, the nausea will likely pass.  Ileus: Following surgery, the bowel can be sluggish, making it difficult for food and gas  to pass through the intestines. This is called an ileus. We have designed our care program to do everything possible to reduce the likelihood of an ileus. If you do develop an ileus, it usually only lasts two to three days. However, it may require a small tube down the nose to decompress the stomach. The best way to avoid an ileus is to reduce the amount of narcotic pain medications, get up as much as possible after your surgery, and stimulate the bowel early after surgery with small amounts of food and liquids.  Wound infection: If a wound infection develops, this usually happens three to ten days after surgery.    Urinary retention: This is if you are unable to urinate after the catheter from your bladder is removed. The catheter may need to be reinserted until you are able to urinate on your own. This can be caused by anesthesia, pain medication and decreased activity.    When you are preparing to go home, you will receive:  Detailed discharge instructions, with information about your operation and  medications    All prescriptions for medications you need at home; prescriptions can be filled while you are in the hospital if you would like    You may be prescribed Lovenox. Lovenox is used to reduce the risk of developing a blood clot after surgery. An appointment to see your surgeon or provider one to two weeks after you leave the hospital for follow-up   After Discharge Once you are discharged: Call us at any time if you are worried about your recovery or if you should have any questions. During regular office hours, (8:30 a.m.-4:30 p.m.), and after hours call [phone].  Call us immediately if:  You have a fever higher than 100.4 degrees.   Your wound is red, more painful or has drainage.    You are nauseated, vomiting or can't keep liquids down.    Your pain is worse and not able to be controlled with the regimen you were sent home with.    If you are bleeding heavily or have a lot of fluid coming from your vagina. If you are on narcotics, the goal is to wean you off of them. If you are running low on supply and need more, call the nurse a few days before you will run out.  It is generally easier to reach someone between 8:30 a.m. - 4:30 p.m., so call early if you think something is not right. A nurse or nurse practitioner is available every day to answer your questions. After hours and on the weekends, the calls go to the resident doctors in the hospital. It may take longer for your phone call to be returned during this time. If you have a true emergency, such as severe abdominal pain, chest pain, shortness of breath or any other acute issues, call 911 and go to the local emergency room. Have them contact our team once you are stable.  Concerns After Discharge Bowel Function Following Your Surgery Your bowels will take several weeks to settle down and may be unpredictable at first. Your bowel movements may become loose, or you may be constipated. For the vast  number of patients, this will get back to normal with time. Make sure you eat nutritious meals, drink plenty of fluids and take regular walks during the first two weeks after your operation. Your Guide to Gynecologic Surgery   Abdominal Pain It is not unusual to suffer gripping pains (colic) during the first  week following removal of a portion of your bowel. This pain usually lasts for a few minutes but goes away between spasms. If you have severe pain lasting more than one to two hours or have a fever and feel generally unwell, you should contact us at  the telephone contact numbers listed at the end of this packet. Hysterectomy: You should have pelvic rest for six (6) weeks or as specified by your doctor after surgery. You should have nothing in the vagina (no tampons, douching, intercourse, etc.,) during this time period. If you have some vaginal spotting, this is normal. If you have heavy bleeding or  a lot of fluid from your vagina, this is NOT normal and you should contact your doctor's office or, if after hours, contact the doctor on call.  Diarrhea: Fiber and Imodium (Loperamide) The first step to improving your frequent or loose stools is to bulk up the stool with fiber. Metamucil is the most common type of fiber that is available at any drug store. Start with 1 teaspoon mixed into food, like yogurt or oatmeal, in the morning and evening. Try not to drink any fluid for one hour after you take the fiber. This will allow the fiber to act like a sponge in your intestines, soaking up all the excess water. Continue this for three to five days. You may increase by 1 teaspoon every three to five days until the desired affect, or you are at 1 tablespoon (3 teaspoons) twice a day. If this doesn't work, you may try over-the-counter Loperamide, which is an antidiarrheal medication. You may take one tablet in the morning and evening or 30 minutes before you typically have diarrhea. You  may take up to eight of these tablets daily. It is best to discuss this with [place] prior to using this medication. If you have continuous diarrhea and abdominal cramping, call [phone].  Foley Catheter Your surgeon may recommend you be discharged home with a foley catheter (bladder catheter) for 1 to 2 weeks. Typically this recommendation will be made for patients undergoing surgery to the lower urinary tract. Before you leave the hospital, your nurse should outfit you with a clip on the inner thigh to secure the catheter to prevent pulling as well as a small bag that can be easily worn on the upper leg under loose fitting pants and skirts. Your nurse will teach you how to exchange the large bag that typically comes with the catheter for the small bag. You may find it convenient to attach the small bag when active during the day and then the large bag when sleeping at night. If there is ever a point when you notice the catheter is not draining urine and youbegin to develop pain behind/above the pubic bone, you should report to the clinic or emergency room immediately as the catheter may be kinked or clogged. Kinking or clogging of the catheter prevents urine from draining from your bladder. Urine will quickly build up in the bladder and can cause severe pain as well as seriously disrupt healing if you have undergone surgery on the lower urinary tract. Additionally, pulling on the catheter can result in displacement of the balloon at the end of the catheter from inside of to outside  of the bladder. This also results in severe pain and can cause bleeding. For this reason, secure the catheter to the clip on your inner thigh at all times as the clip prevents against pulling.  Wound Care For the first  few weeks following surgery, your wound may be slightly red and uncomfortable. You may shower and let the soapy water wash over your incision. Avoid soaking in the tub for one month following surgery  or until the wound is well healed. It will take the wound several months to "soften." It is common to have bumpy areas in the wound near the belly  button and at the ends of the incision.  If you have staples, these should be removed when you are seen by your surgeon at the follow-up appointment. You may have a glue-like material on your incision. Do not pick at this. It will come off over time. It is the surgical glue used in surgery to close your incision. You also have sutures inside of you that will dissolve over time  Post-Surgery Diet Attention to good nutrition after surgery is important to your recovery. If you had no dietary restrictions prior to the surgery, you will have no special dietary restrictions after the surgery. However, consuming enough protein, calories, vitamins and minerals is necessary to support healing. Some patients find their appetite is less than normal after surgery. In this case, frequent small meals throughout the day may help. It is not uncommon to lose 10 to 15 pounds after surgery. However, by the fourth to fifth week, your weight loss should stabilize. It is normal that certain foods taste different and certain smells may make you nauseas. Over time, the amount you can comfortably consume will gradually increase. You should try to eat a balanced diet, which includes:  Foods that are soft, moist, and easy to chew and swallow    Canned or soft-cooked fruits and vegetables   Plenty of soft breads, rice, pasta, potatoes and other starchy foods (lowerfiber  varieties may be tolerated better initially)    High-protein foods and beverages, such as meats, eggs, milk, cottage cheese  or a supplemental nutrition drink like Boost or Ensure    Drink plenty of fluids-at least 8 to 10 cups per day. This includes water,  fruit juice, Gatorade, teas/coffee and milk. Drinking plenty is especially important if you have loose stools (diarrhea).   Avoid drinking  a lot of caffeine, since this may dehydrate you.    Avoid fried, greasy and highly seasoned or spicy foods.    Avoid carbonated beverages in the first couple weeks.    Avoid raw fruits and vegetables.   Hobbies/Activities Walking is encouraged after your surgery. You should plan to undertake regular exercise several times a day and gradually increase this during the four weeks following your operation until you are back to your normal level of activity. You may climb stairs. Don't do any heavy lifting greater than 10 pounds or contact sports for the first month after your surgery. Generally, you can return to hobbies and activities soon after your surgery. This will help you recover. It can take up to two to three months to fully recover. It is not unusual to be fatigued and require an afternoon nap for up to six to eight weeks following surgery. Your body is using this energy to heal your wounds. Set small goals for yourself and try to do a little more each day.  Work It is normal to return to work three to six weeks following your operation. If your job involves heavy manual work, then you should wait six weeks. However, you should check with your employer regarding rules, which may be relevant to your return to work. If you need  a return-to-work form for your employer or disability papers, bring them to your follow-up appointment or fax them to our office at [phone].  Driving You may drive when you are off narcotics and pain-free enough to react quickly with your braking foot. For most patients, this occurs at one to four weeks following surgery.   Write down any questions you may have to ask your care team.  Important Contact Numbers: GYN Oncology Office: [phone]   Discharge wound care:    Complete by:  As directed    Keep clean and dry   Driving Restrictions    Complete by:  As directed    No driving for 1- 2 weeks   Increase activity slowly    Complete by:   As directed    Lifting restrictions    Complete by:  As directed    No lifting > 5 lbs for 6 weeks   May shower / Bathe    Complete by:  As directed    No tub baths for 6 weeks   Sexual Activity Restrictions    Complete by:  As directed    No intercourse for  8 weeks     Allergies as of 10/23/2016   No Known Allergies     Medication List    TAKE these medications   aspirin EC 81 MG tablet Take 81 mg by mouth daily.   atorvastatin 20 MG tablet Commonly known as:  LIPITOR Take 20 mg by mouth every evening.   calcium-vitamin D 500-200 MG-UNIT tablet Take 1 tablet by mouth daily.   celecoxib 200 MG capsule Commonly known as:  CELEBREX Take 200 mg by mouth at bedtime.   citalopram 20 MG tablet Commonly known as:  CELEXA Take 20 mg by mouth at bedtime.   levothyroxine 100 MCG tablet Commonly known as:  SYNTHROID, LEVOTHROID Take 100 mcg by mouth daily before breakfast.   multivitamin with minerals Tabs tablet Take 1 tablet by mouth daily.   oxyCODONE-acetaminophen 5-325 MG tablet Commonly known as:  PERCOCET/ROXICET Take 1-2 tablets by mouth every 4 (four) hours as needed for severe pain.   pantoprazole 40 MG tablet Commonly known as:  PROTONIX Take 40 mg by mouth every morning.   raloxifene 60 MG tablet Commonly known as:  EVISTA Take 60 mg by mouth daily.   valsartan-hydrochlorothiazide 320-25 MG tablet Commonly known as:  DIOVAN-HCT Take 1 tablet by mouth at bedtime.        SignedLahoma Crocker A 10/23/2016, 8:59 AM

## 2016-10-25 ENCOUNTER — Telehealth: Payer: Self-pay | Admitting: *Deleted

## 2016-10-25 NOTE — Telephone Encounter (Signed)
I have called and gave the daughter the post op appt for April 30th. Daughter aware of date/time

## 2016-10-28 ENCOUNTER — Telehealth: Payer: Self-pay | Admitting: Gynecologic Oncology

## 2016-10-28 NOTE — Telephone Encounter (Signed)
Path findings shared.  No adjuvant treatment recommended.  No complaints.

## 2016-10-30 ENCOUNTER — Telehealth: Payer: Self-pay

## 2016-10-30 NOTE — Telephone Encounter (Signed)
Ms Urbas has been experiencing  Some light to clear serous drainage from the Incision near the umbilicus.   Afebrile. No redness at site.  Drainage  is getting on clothing and wetting gauze. Drainage is  not copious amounts of fluid. Told Ms Sylvia that Dr. Denman George stated that this can happen with the incision sites.  Resuturing  The site is not indicated. Pt verbalized understanding.  She is to call if she develops a temperature of 101.5 or greater and or with purulent drainage.

## 2016-11-04 ENCOUNTER — Telehealth: Payer: Self-pay | Admitting: *Deleted

## 2016-11-04 NOTE — Telephone Encounter (Signed)
Patient called wanted an appt, patient having some post op issues. Appt given for April 24th at 3:30 pm

## 2016-11-05 ENCOUNTER — Ambulatory Visit: Payer: Medicare HMO | Attending: Gynecologic Oncology | Admitting: Gynecologic Oncology

## 2016-11-05 ENCOUNTER — Encounter: Payer: Self-pay | Admitting: Gynecologic Oncology

## 2016-11-05 VITALS — BP 158/67 | HR 87 | Temp 97.9°F | Resp 17 | Ht 62.0 in | Wt 140.5 lb

## 2016-11-05 DIAGNOSIS — C541 Malignant neoplasm of endometrium: Secondary | ICD-10-CM

## 2016-11-05 DIAGNOSIS — Z79899 Other long term (current) drug therapy: Secondary | ICD-10-CM | POA: Diagnosis not present

## 2016-11-05 DIAGNOSIS — R102 Pelvic and perineal pain: Secondary | ICD-10-CM | POA: Diagnosis not present

## 2016-11-05 DIAGNOSIS — Z7982 Long term (current) use of aspirin: Secondary | ICD-10-CM | POA: Insufficient documentation

## 2016-11-05 NOTE — Patient Instructions (Signed)
Follow up with Dr. Skeet Latch on August 2,2018 at 1:30 pm. Arrive at 1:15 pm.

## 2016-11-05 NOTE — Progress Notes (Signed)
Consult Note: Gyn-Onc  Consult was requested by Dr. Stann Mainland for the evaluation of Vicki Cervantes 76 y.o. female  CC:  Chief Complaint  Patient presents with  . Endometrial cancer    Assessment/Plan:  Vicki Cervantes  is a 76 y.o.  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  Given the very low risk for recurrence (no myometrial invasion and disease restricted to a polyp) we are not recommending adjuvant therapy in accordance with NCCN guidelines.  I am recommending 3-4 monthly surveillance exams for the next 2 years followed by 6 monthly exams in the subsequent 3 years.  We discussed the possibility for recurrence and the most common symptoms associated with that. She was instructed to inform us if this develops.  Drainage from incision - likely lymph drainage. Has resolved. Reinforced incision with dermabond.  Mons pubic tenderness - likely mild lymphedema. Counseled regarding compression garments.  She will return to see Dr Skeet Latch in 39month.   HPI: Vicki Cervantes who is seen in consultation at the request of Dr WStann Mainlandfor high grade serous endometrial cancer.  The patient reports post menopausal spotting since December 2017. She reported this to Dr WStann Mainlandwho performed an [place] showed a normal sized uterus but with a thickened endometrial lining. Endometrial biopsy was taken on 08/29/16 which showed a high grade endometrial cancer, favor serous morphology.  The patient is otherwise fairly healthy. She has had 2 prior vaginal deliveries. She has a remote history of breast cancer at age [age]but received BRCA testing approximately 5 years ago which was negative. Her cancer was treated with surgery but no radiation or chemotherapy.   She has a history of a laparotomy for endometriosis which involved a unilateral salpingo-oophorectomy and possible partial oophorectomy on the contralateral side (>30 years ago). She is unclear which side was  removed.    She takes aspirin 81 mg daily.  Interval Hx: On 10/22/16 she underwent robotic assisted total hysterectomy, BSO and bilateral pelvic lymphadenectomy with Dr BSkeet Latch  Final pathology revealed a 3cm polyp containing serous carcinoma but with no myometrial invasion, no LVSI and negative nodes.  Postoperatively she has developed leakage of serous fluid from the right lower quadrant incision site. This has stopped draining in the past 24 hours.  She feels generally cold, but denies fevers or chills. She has some mons pubic discomfort.  Current Meds:  Outpatient Encounter Prescriptions as of 11/05/2016  Medication Sig  . aspirin EC 81 MG tablet Take 81 mg by mouth daily.  .Marland Kitchenatorvastatin (LIPITOR) 20 MG tablet Take 20 mg by mouth every evening.   . Calcium Carbonate-Vitamin D (CALCIUM-VITAMIN D) 500-200 MG-UNIT per tablet Take 1 tablet by mouth daily.  . celecoxib (CELEBREX) 200 MG capsule Take 200 mg by mouth at bedtime.   . citalopram (CELEXA) 20 MG tablet Take 20 mg by mouth at bedtime.   .Marland Kitchenlevothyroxine (SYNTHROID, LEVOTHROID) 100 MCG tablet Take 100 mcg by mouth daily before breakfast.   . Multiple Vitamin (MULTIVITAMIN WITH MINERALS) TABS Take 1 tablet by mouth daily.  . pantoprazole (PROTONIX) 40 MG tablet Take 40 mg by mouth every morning.   . raloxifene (EVISTA) 60 MG tablet Take 60 mg by mouth daily.    . valsartan-hydrochlorothiazide (DIOVAN-HCT) 320-25 MG per tablet Take 1 tablet by mouth at bedtime.   . [DISCONTINUED] oxyCODONE-acetaminophen (PERCOCET/ROXICET) 5-325 MG tablet Take 1-2 tablets by mouth every 4 (four) hours  as needed for severe pain.   No facility-administered encounter medications on file as of 11/05/2016.     Allergy: No Known Allergies  Social Hx:   Social History   Social History  . Marital status: Widowed    Spouse name: N/A  . Number of children: 3  . Years of education: N/A   Occupational History  . 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 Smoking status: Never Smoker  . Smokeless tobacco: Never Used  . Alcohol use 0.0 oz/week     Comment: Wine Occas.  . Drug use: No  . Sexual activity: No   Other Topics Concern  . Not on file   Social History Narrative  . No narrative on file    Past Surgical Hx:  Past Surgical History:  Procedure Laterality Date  . EUS N/A 01/07/2013   Procedure: UPPER ENDOSCOPIC ULTRASOUND (EUS) LINEAR;  Surgeon: Milus Banister, MD;  Location: [facility];  Service: Endoscopy;  Laterality: N/A;  . EUS N/A 01/05/2015   Procedure: UPPER ENDOSCOPIC ULTRASOUND (EUS) LINEAR;  Surgeon: Milus Banister, MD;  Location: [facility];  Service: Endoscopy;  Laterality: N/A;  . FOOT SURGERY Left 2009-2010   x2. Surgery in 2011  . LEFT HEART CATHETERIZATION WITH CORONARY ANGIOGRAM N/A 01/24/2014   Procedure: LEFT HEART CATHETERIZATION WITH CORONARY ANGIOGRAM;  Surgeon: Peter M Martinique, MD;  Location: [facility] CATH LAB;  Service: Cardiovascular;  Laterality: N/A;  . LUMBAR [place] SURGERY  01-01-13  . MASTECTOMY, RADICAL Left 1982  . OVARY SURGERY  1972-1973  . ROBOTIC ASSISTED TOTAL HYSTERECTOMY WITH BILATERAL SALPINGO OOPHERECTOMY N/A 10/22/2016   Procedure: XI ROBOTIC ASSISTED TOTAL HYSTERECTOMY WITH RIGHT SALPINGO OOPHORECTOMY; INJECTION OF GREEN DYE WITH EXCISION OF BILATERAL PELVIC LYMPH NODES;  Surgeon: Janie Morning, MD;  Location: [facility];  Service: Gynecology;  Laterality: N/A;  . ROTATOR CUFF REPAIR Left   . SENTINEL NODE BIOPSY N/A 10/22/2016   Procedure: SENTINEL NODE BIOPSY;  Surgeon: Janie Morning, MD;  Location: [facility];  Service: Gynecology;  Laterality: N/A;  . SIMPLE MASTECTOMY Left 1982  . TOTAL KNEE ARTHROPLASTY Bilateral 2009-2010   x2    Past Medical Hx:  Past Medical History:  Diagnosis Date  . Arthritis   . Breast cancer ([place])     LEFT, '82-left breast cancer-surgery only  . Complication of anesthesia    nausea  . Diverticulosis   . Esophageal  stricture   . GERD (gastroesophageal reflux disease)   . Hemorrhoids   . Hiatal hernia   . High triglycerides   . Hypercholesteremia   . Hypertension   . Hypothyroidism   . Osteoarthritis   . PONV (postoperative nausea and vomiting)     Past Gynecological History:  G2P2 No LMP recorded. Patient is postmenopausal.  Family Hx:  Family History  Problem Relation Age of Onset  . Heart disease Mother   . Heart attack Father   . Heart attack Brother     Review of Systems:  Constitutional  Feels well,    ENT Normal appearing ears and nares bilaterally Skin/Breast  No rash, sores, jaundice, itching, dryness Cardiovascular  No chest pain, shortness of breath, or edema  Pulmonary  No cough or wheeze.  Gastro Intestinal  No nausea, vomitting, or diarrhoea. No bright red blood per rectum, no abdominal pain, change in bowel movement, or constipation.  Genito Urinary  No frequency, urgency, dysuria,  Musculo Skeletal  No myalgia, arthralgia, joint swelling or pain  Neurologic  No weakness, numbness, change in gait,  Psychology  No depression, anxiety, insomnia.   Vitals:  Blood pressure (!) 158/67, pulse 87, temperature 97.9 F (36.6 C), temperature source Oral, resp. rate 17, height '5\' 2"'  (1.575 m), weight 140 lb 8 oz (63.7 kg), SpO2 97 %.  Physical Exam: WD in NAD Neck  Supple NROM, without any enlargements.  Lymph Node Survey No cervical supraclavicular or inguinal adenopathy Cardiovascular  Pulse normal rate, regularity and rhythm. S1 and S2 normal.  Lungs  Clear to auscultation bilateraly, without wheezes/crackles/rhonchi. Good air movement.  Skin  No rash/lesions/breakdown  Psychiatry  Alert and oriented to person, place, and time  Abdomen  Normoactive bowel sounds, abdomen soft, non-tender and overweight without evidence of hernia. Incisions all closed, none draining, no erythema. Reinforced right lateral incision with dermabond. Back No CVA tenderness Genito  Urinary  Vulva/vagina: Normal external female genitalia.  No lesions. No discharge or bleeding. Vaginal cuff: smooth, well healed, no lesions. Rectal  deferred Extremities  No bilateral cyanosis, clubbing or edema.   30 minutes of direct face to face counseling time was spent with the patient. This included discussion about prognosis, therapy recommendations and postoperative side effects and are beyond the scope of routine postoperative care.   Donaciano Eva, MD  11/05/2016, 6:36 PM

## 2016-11-11 ENCOUNTER — Ambulatory Visit: Payer: Medicare HMO | Admitting: Gynecologic Oncology

## 2016-11-18 DIAGNOSIS — L570 Actinic keratosis: Secondary | ICD-10-CM | POA: Diagnosis not present

## 2016-11-18 DIAGNOSIS — Z85828 Personal history of other malignant neoplasm of skin: Secondary | ICD-10-CM | POA: Diagnosis not present

## 2016-11-18 DIAGNOSIS — L821 Other seborrheic keratosis: Secondary | ICD-10-CM | POA: Diagnosis not present

## 2016-11-27 DIAGNOSIS — I10 Essential (primary) hypertension: Secondary | ICD-10-CM | POA: Diagnosis not present

## 2016-11-27 DIAGNOSIS — E784 Other hyperlipidemia: Secondary | ICD-10-CM | POA: Diagnosis not present

## 2016-11-27 DIAGNOSIS — M1990 Unspecified osteoarthritis, unspecified site: Secondary | ICD-10-CM | POA: Diagnosis not present

## 2016-11-27 DIAGNOSIS — M5126 Other intervertebral disc displacement, lumbar region: Secondary | ICD-10-CM | POA: Diagnosis not present

## 2016-11-27 DIAGNOSIS — Z1389 Encounter for screening for other disorder: Secondary | ICD-10-CM | POA: Diagnosis not present

## 2016-11-27 DIAGNOSIS — R2689 Other abnormalities of gait and mobility: Secondary | ICD-10-CM | POA: Diagnosis not present

## 2016-11-27 DIAGNOSIS — R5383 Other fatigue: Secondary | ICD-10-CM | POA: Diagnosis not present

## 2016-11-27 DIAGNOSIS — K649 Unspecified hemorrhoids: Secondary | ICD-10-CM | POA: Diagnosis not present

## 2016-11-27 DIAGNOSIS — R6883 Chills (without fever): Secondary | ICD-10-CM | POA: Diagnosis not present

## 2016-11-27 DIAGNOSIS — E041 Nontoxic single thyroid nodule: Secondary | ICD-10-CM | POA: Diagnosis not present

## 2016-12-24 DIAGNOSIS — R69 Illness, unspecified: Secondary | ICD-10-CM | POA: Diagnosis not present

## 2016-12-24 DIAGNOSIS — M25512 Pain in left shoulder: Secondary | ICD-10-CM | POA: Diagnosis not present

## 2016-12-24 DIAGNOSIS — G8929 Other chronic pain: Secondary | ICD-10-CM | POA: Diagnosis not present

## 2017-01-06 ENCOUNTER — Ambulatory Visit: Payer: Medicare HMO | Admitting: Physical Therapy

## 2017-01-21 ENCOUNTER — Ambulatory Visit: Payer: Medicare HMO | Admitting: Physical Therapy

## 2017-01-23 DIAGNOSIS — H2511 Age-related nuclear cataract, right eye: Secondary | ICD-10-CM | POA: Diagnosis not present

## 2017-01-23 DIAGNOSIS — H5201 Hypermetropia, right eye: Secondary | ICD-10-CM | POA: Diagnosis not present

## 2017-01-23 DIAGNOSIS — H33193 Other retinoschisis and retinal cysts, bilateral: Secondary | ICD-10-CM | POA: Diagnosis not present

## 2017-01-24 NOTE — Anesthesia Postprocedure Evaluation (Signed)
Anesthesia Post Note  Patient: Vicki Cervantes  Procedure(s) Performed: Procedure(s) (LRB): XI ROBOTIC ASSISTED TOTAL HYSTERECTOMY WITH RIGHT SALPINGO OOPHORECTOMY; INJECTION OF GREEN DYE WITH EXCISION OF BILATERAL PELVIC LYMPH NODES (N/A) SENTINEL NODE BIOPSY (N/A)     Anesthesia Post Evaluation  Last Vitals:  Vitals:   10/23/16 0547 10/23/16 1017  BP: 110/61 (!) 104/39  Pulse: 72 84  Resp: 16 16  Temp: 36.9 C 36.5 C    Last Pain:  Vitals:   10/23/16 1017  TempSrc: Oral  PainSc:                  Riccardo Dubin

## 2017-01-24 NOTE — Addendum Note (Signed)
Addendum  created 01/24/17 1143 by Lyndle Herrlich, MD   Sign clinical note

## 2017-02-11 DIAGNOSIS — H43813 Vitreous degeneration, bilateral: Secondary | ICD-10-CM | POA: Diagnosis not present

## 2017-02-11 DIAGNOSIS — H33193 Other retinoschisis and retinal cysts, bilateral: Secondary | ICD-10-CM | POA: Diagnosis not present

## 2017-02-11 DIAGNOSIS — D485 Neoplasm of uncertain behavior of skin: Secondary | ICD-10-CM | POA: Diagnosis not present

## 2017-02-11 DIAGNOSIS — Z85828 Personal history of other malignant neoplasm of skin: Secondary | ICD-10-CM | POA: Diagnosis not present

## 2017-02-11 DIAGNOSIS — H35372 Puckering of macula, left eye: Secondary | ICD-10-CM | POA: Diagnosis not present

## 2017-02-13 ENCOUNTER — Other Ambulatory Visit (HOSPITAL_BASED_OUTPATIENT_CLINIC_OR_DEPARTMENT_OTHER): Payer: Medicare HMO

## 2017-02-13 ENCOUNTER — Encounter: Payer: Self-pay | Admitting: Gynecologic Oncology

## 2017-02-13 ENCOUNTER — Ambulatory Visit: Payer: Medicare HMO | Attending: Gynecologic Oncology | Admitting: Gynecologic Oncology

## 2017-02-13 VITALS — BP 150/73 | HR 84 | Temp 98.1°F | Resp 18 | Ht 62.0 in | Wt 138.7 lb

## 2017-02-13 DIAGNOSIS — I2510 Atherosclerotic heart disease of native coronary artery without angina pectoris: Secondary | ICD-10-CM | POA: Diagnosis not present

## 2017-02-13 DIAGNOSIS — I10 Essential (primary) hypertension: Secondary | ICD-10-CM | POA: Diagnosis not present

## 2017-02-13 DIAGNOSIS — E781 Pure hyperglyceridemia: Secondary | ICD-10-CM | POA: Insufficient documentation

## 2017-02-13 DIAGNOSIS — N899 Noninflammatory disorder of vagina, unspecified: Secondary | ICD-10-CM

## 2017-02-13 DIAGNOSIS — Z8542 Personal history of malignant neoplasm of other parts of uterus: Secondary | ICD-10-CM | POA: Diagnosis not present

## 2017-02-13 DIAGNOSIS — N898 Other specified noninflammatory disorders of vagina: Secondary | ICD-10-CM | POA: Insufficient documentation

## 2017-02-13 DIAGNOSIS — Z9012 Acquired absence of left breast and nipple: Secondary | ICD-10-CM | POA: Insufficient documentation

## 2017-02-13 DIAGNOSIS — Z90710 Acquired absence of both cervix and uterus: Secondary | ICD-10-CM | POA: Diagnosis not present

## 2017-02-13 DIAGNOSIS — I700 Atherosclerosis of aorta: Secondary | ICD-10-CM | POA: Insufficient documentation

## 2017-02-13 DIAGNOSIS — Z7982 Long term (current) use of aspirin: Secondary | ICD-10-CM | POA: Insufficient documentation

## 2017-02-13 DIAGNOSIS — R918 Other nonspecific abnormal finding of lung field: Secondary | ICD-10-CM | POA: Insufficient documentation

## 2017-02-13 DIAGNOSIS — K449 Diaphragmatic hernia without obstruction or gangrene: Secondary | ICD-10-CM | POA: Diagnosis not present

## 2017-02-13 DIAGNOSIS — K317 Polyp of stomach and duodenum: Secondary | ICD-10-CM | POA: Diagnosis not present

## 2017-02-13 DIAGNOSIS — E041 Nontoxic single thyroid nodule: Secondary | ICD-10-CM | POA: Insufficient documentation

## 2017-02-13 DIAGNOSIS — Z8249 Family history of ischemic heart disease and other diseases of the circulatory system: Secondary | ICD-10-CM | POA: Insufficient documentation

## 2017-02-13 DIAGNOSIS — Z9889 Other specified postprocedural states: Secondary | ICD-10-CM | POA: Insufficient documentation

## 2017-02-13 DIAGNOSIS — K219 Gastro-esophageal reflux disease without esophagitis: Secondary | ICD-10-CM | POA: Diagnosis not present

## 2017-02-13 DIAGNOSIS — Z96653 Presence of artificial knee joint, bilateral: Secondary | ICD-10-CM | POA: Diagnosis not present

## 2017-02-13 DIAGNOSIS — M1990 Unspecified osteoarthritis, unspecified site: Secondary | ICD-10-CM | POA: Diagnosis not present

## 2017-02-13 DIAGNOSIS — Z853 Personal history of malignant neoplasm of breast: Secondary | ICD-10-CM | POA: Insufficient documentation

## 2017-02-13 DIAGNOSIS — E039 Hypothyroidism, unspecified: Secondary | ICD-10-CM | POA: Insufficient documentation

## 2017-02-13 DIAGNOSIS — E042 Nontoxic multinodular goiter: Secondary | ICD-10-CM | POA: Insufficient documentation

## 2017-02-13 DIAGNOSIS — E7800 Pure hypercholesterolemia, unspecified: Secondary | ICD-10-CM | POA: Insufficient documentation

## 2017-02-13 DIAGNOSIS — C541 Malignant neoplasm of endometrium: Secondary | ICD-10-CM

## 2017-02-13 DIAGNOSIS — Z8540 Personal history of malignant neoplasm of unspecified female genital organ: Secondary | ICD-10-CM | POA: Diagnosis not present

## 2017-02-13 LAB — BASIC METABOLIC PANEL
Anion Gap: 8 mEq/L (ref 3–11)
BUN: 25.2 mg/dL (ref 7.0–26.0)
CALCIUM: 10 mg/dL (ref 8.4–10.4)
CO2: 28 meq/L (ref 22–29)
CREATININE: 1.2 mg/dL — AB (ref 0.6–1.1)
Chloride: 102 mEq/L (ref 98–109)
EGFR: 42 mL/min/{1.73_m2} — ABNORMAL LOW (ref >90)
GLUCOSE: 118 mg/dL (ref 70–140)
Potassium: 4.2 mEq/L (ref 3.5–5.1)
SODIUM: 139 meq/L (ref 136–145)

## 2017-02-13 NOTE — Patient Instructions (Signed)
We will call you with the results of your biopsy from today.  Plan to have a repeat CT of the chest.  Plan to follow up with Dr. Skeet Latch in three months or sooner if needed.

## 2017-02-13 NOTE — Progress Notes (Signed)
GYN ONCOLOGY OFFICE VISIT   Referring MD: Dr. Stann Mainland for the evaluation  PCP Nada Libman  CC:  Chief Complaint  Patient presents with  . Endometrial cancer [facility])    Assessment/Plan:  Vicki Cervantes  is a 76 y.o.  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  Given the very low risk for recurrence (no myometrial invasion and disease restricted to a polyp) we are not recommending adjuvant therapy in accordance with NCCN guidelines.  Recommend 3-4 monthly surveillance exams for the next 2 years followed by 6 monthly exams in the subsequent 3 years.  Vaginal nodule Overgrowth at the vaginal cuff removed and sent to path.  D/Dx granulation tissue versus local recurrence.   Will contact [name] with biopsy results.   Gastric mass. Gastric polyp being followed for  The past 6 years.  Followed by Dr. Massie Bougie  Bilateral pulmonary nodules. Incidental finding of numerous bilateral pulmonary nodules <76m at time of imaging for new diagnosis of serous uterine cancer Repeat CT chest with and without contrast.  Thyroid nodule Identified on pre op imaging F/U with PCP - plan for thyroid ultrasound in 05/2017  She will return to see Dr BSkeet Latchin 339month   HPI: Vicki Cervantes a [age]ear old parous woman who is seen in consultation at the request of Dr WeStann Mainlandor high grade serous endometrial cancer.  The patient reports post menopausal spotting since December 2017. She reported this to Dr WeStann Mainlandho performed an [place] showed a normal sized uterus but with a thickened endometrial lining. Endometrial biopsy was taken on 08/29/16 which showed a high grade endometrial cancer, favor serous morphology.  The patient is otherwise fairly healthy. She has had 2 prior vaginal deliveries. She has a remote history of breast cancer at age [age]ut received BRCA testing approximately 5 years ago which was negative. Her cancer was treated with surgery but no radiation or  chemotherapy.   She has a history of a laparotomy for endometriosis which involved a unilateral salpingo-oophorectomy and possible partial oophorectomy on the contralateral side (>30 years ago). She is unclear which side was removed.    She takes aspirin 81 mg daily.  Interval Hx: CT C/A/P 09/2016 IMPRESSION: 1. Thickening of the endometrial canal up to 19 mm in fundus, presumably corresponding to the patient's reported endometrial carcinoma. 2. Multiple tiny pulmonary nodules scattered throughout the lungs bilaterally measuring 4 mm or less in size. Nodules of this size are typically considered statistically likely benign. In the setting of known primary malignancy, metastatic disease to the lungs is not excluded, but is not strongly favored on today's examination. Attention on followup studies is recommended to ensure the stability or resolution of these nodules. 3. Subcentimeter low-attenuation lesion in the central aspect of segment 8 of the liver is too small to characterize. This is statistically likely a tiny cyst, but warrants attention on follow-up studies to exclude the possibility of a solitary hepatic metastasis. 4. 1.5 x 1.5 x 1.7 cm well-circumscribed lesion in the proximal stomach. This is of uncertain etiology and significance, and could represent a benign gastric polyp, however, further evaluation with nonemergent endoscopy is suggested in the near future for further evaluation. 5. **An incidental finding of potential clinical significance has been found. 1.1 x 1.6 cm thyroid nodule in the inferior aspect of the right lobe of the thyroid gland. Follow-up evaluation with nonemergent thyroid ultrasound is recommended in the near future to better  evaluate this finding. This recommendation follows ACR consensuss guidelines: Managing Incidental Thyroid Nodules Detected on Imaging: White Paper of the ACR Incidental Thyroid Findings Committee. J Am Coll Radiol 2015;12(2):143-150.** 6. Aortic  atherosclerosis, in addition to left anterior descending coronary artery disease. Assessment for potential risk factor modification, dietary therapy or pharmacologic therapy may be warranted, if clinically indicated.   On 10/22/16 she underwent robotic assisted total hysterectomy, BSO and bilateral pelvic lymphadenectomy with Dr Brewster.  Final pathology revealed a 3cm polyp containing serous carcinoma but with no myometrial invasion, no LVSI and negative nodes. Denies vaginal bleeding.  Feels well.  Social Hx:   Social History   Social History  . Marital status: Widowed    Spouse name: N/A  . Number of children: 3  . Years of education: N/A   Occupational History  . 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 Smoking status: Never Smoker  . Smokeless tobacco: Never Used  . Alcohol use 0.0 oz/week     Comment: Wine Occas.  . Drug use: No  . Sexual activity: No   Other Topics Concern  . Not on file   Social History Narrative  . No narrative on file    Past Surgical Hx:  Past Surgical History:  Procedure Laterality Date  . EUS N/A 01/07/2013   Procedure: UPPER ENDOSCOPIC ULTRASOUND (EUS) LINEAR;  Surgeon: Daniel P Jacobs, MD;  Location: [facility];  Service: Endoscopy;  Laterality: N/A;  . EUS N/A 01/05/2015   Procedure: UPPER ENDOSCOPIC ULTRASOUND (EUS) LINEAR;  Surgeon: Daniel P Jacobs, MD;  Location: [facility];  Service: Endoscopy;  Laterality: N/A;  . FOOT SURGERY Left 2009-2010   x2. Surgery in 2011  . LEFT HEART CATHETERIZATION WITH CORONARY ANGIOGRAM N/A 01/24/2014   Procedure: LEFT HEART CATHETERIZATION WITH CORONARY ANGIOGRAM;  Surgeon: Peter M Jordan, MD;  Location: [facility] CATH LAB;  Service: Cardiovascular;  Laterality: N/A;  . LUMBAR [place] SURGERY  01-01-13  . MASTECTOMY, RADICAL Left 1982  . OVARY SURGERY  1972-1973  . ROBOTIC ASSISTED TOTAL HYSTERECTOMY WITH BILATERAL SALPINGO OOPHERECTOMY N/A 10/22/2016   Procedure: XI ROBOTIC  ASSISTED TOTAL HYSTERECTOMY WITH RIGHT SALPINGO OOPHORECTOMY; INJECTION OF GREEN DYE WITH EXCISION OF BILATERAL PELVIC LYMPH NODES;  Surgeon: Wendy Brewster, MD;  Location: [facility];  Service: Gynecology;  Laterality: N/A;  . ROTATOR CUFF REPAIR Left   . SENTINEL NODE BIOPSY N/A 10/22/2016   Procedure: SENTINEL NODE BIOPSY;  Surgeon: Wendy Brewster, MD;  Location: [facility];  Service: Gynecology;  Laterality: N/A;  . SIMPLE MASTECTOMY Left 1982  . TOTAL KNEE ARTHROPLASTY Bilateral 2009-2010   x2    Past Medical Hx:  Past Medical History:  Diagnosis Date  . Arthritis   . Breast cancer ([facility])     LEFT, '82-left breast cancer-surgery only  . Complication of anesthesia    nausea  . Diverticulosis   . Esophageal stricture   . GERD (gastroesophageal reflux disease)   . Hemorrhoids   . Hiatal hernia   . High triglycerides   . Hypercholesteremia   . Hypertension   . Hypothyroidism   . Osteoarthritis   . PONV (postoperative nausea and vomiting)     Past Gynecological History:  G2P2 No LMP recorded. Patient is postmenopausal.  Family Hx:  Family History  Problem Relation Age of Onset  . Heart disease Mother   . Heart attack Father   . Heart attack Brother     Review of Systems:  Constitutional  Feels   well,    ENT Normal appearing ears and nares bilaterally Skin/Breast  No rash, sores, jaundice, itching, dryness Cardiovascular  No chest pain, shortness of breath, or edema  Pulmonary  No cough or wheeze.  Gastro Intestinal  No nausea, vomitting, or diarrhoea. No bright red blood per rectum, no abdominal pain, change in bowel movement, or constipation.  Genito Urinary  No frequency, urgency, dysuria, no vaginal bleeding Musculo Skeletal  No myalgia, arthralgia, joint swelling or pain  Neurologic  No weakness, numbness, change in gait,  Psychology  No depression, anxiety, insomnia.   Vitals:  Blood pressure (!) 150/73, pulse 84, temperature 98.1 F (36.7 C), temperature  source Oral, resp. rate 18, height 5' 2" (1.575 m), weight 138 lb 11.2 oz (62.9 kg), SpO2 97 %.. Body mass index is 25.37 kg/m.   Physical Exam: WD in NAD Neck  Supple NROM, without any enlargements.  Lymph Node Survey No cervical supraclavicular or inguinal adenopathy Cardiovascular  Pulse normal rate, regularity and rhythm. S1 and S2 normal.  Lungs  Clear to auscultation bilateraly, without wheezes/crackles/rhonchi. Good air movement.  Skin  No rash/lesions/breakdown  Psychiatry  Alert and oriented to person, place, and time  Abdomen  Normoactive bowel sounds, abdomen soft, non-tender and overweight without evidence of hernia. Incisions all closed, none draining, no erythema. Reinforced right lateral incision with dermabond. Back No CVA tenderness Genito Urinary  Vulva/vagina: Normal external female genitalia.  No lesions. No discharge or bleeding. Vaginal cuff: 3mm nodules at vaginal apex with vaginal mucosal separation.  Lesions excised and sent to pathology.  Silver nitrate placed.   Rectal  Good tone no masses Extremities  No bilateral cyanosis, clubbing or edema.    BREWSTER, WENDY, MD  02/13/2017, 2:26 PM     

## 2017-02-18 ENCOUNTER — Telehealth: Payer: Self-pay

## 2017-02-18 NOTE — Telephone Encounter (Signed)
-----   Message from Dorothyann Gibbs, NP sent at 02/18/2017 10:45 AM EDT ----- Please let the patient know her biopsy showed granulation (healing) tissue and no cancer was seen.  Thank you ----- Message ----- From: Interface, Lab In [facility] Sent: 02/17/2017  11:49 AM To: Dorothyann Gibbs, NP

## 2017-02-18 NOTE — Telephone Encounter (Signed)
Told Vicki Cervantes the results of the biopsy as noted below by Melissa Cross,NP. Discussed using a swimming pool on vacation post Bx.  Instructed her that she could go into the pool 2 weeks post Bx per Melissa.  Pt verbalized understanding.

## 2017-02-20 ENCOUNTER — Ambulatory Visit (HOSPITAL_COMMUNITY)
Admission: RE | Admit: 2017-02-20 | Discharge: 2017-02-20 | Disposition: A | Discharge status: Home / Self Care | Payer: Medicare HMO | Source: Ambulatory Visit | Attending: Gynecologic Oncology | Admitting: Gynecologic Oncology

## 2017-02-20 DIAGNOSIS — C50919 Malignant neoplasm of unspecified site of unspecified female breast: Secondary | ICD-10-CM | POA: Diagnosis not present

## 2017-02-20 DIAGNOSIS — R918 Other nonspecific abnormal finding of lung field: Secondary | ICD-10-CM | POA: Diagnosis not present

## 2017-02-20 DIAGNOSIS — C541 Malignant neoplasm of endometrium: Secondary | ICD-10-CM | POA: Diagnosis present

## 2017-02-20 MED ORDER — IOPAMIDOL (ISOVUE-300) INJECTION 61%
75.0000 mL | Freq: Once | INTRAVENOUS | Status: AC | PRN
  Administered 2017-02-20: 60 mL via INTRAVENOUS

## 2017-02-20 MED ORDER — IOPAMIDOL (ISOVUE-300) INJECTION 61%
INTRAVENOUS | Status: AC
  Filled 2017-02-20: qty 75

## 2017-02-21 ENCOUNTER — Telehealth: Payer: Self-pay | Admitting: Gynecologic Oncology

## 2017-02-21 NOTE — Telephone Encounter (Signed)
Patient informed of CT chest results.  No concerns voiced.  Advised to call for any needs or concerns.

## 2017-03-25 DIAGNOSIS — D485 Neoplasm of uncertain behavior of skin: Secondary | ICD-10-CM | POA: Diagnosis not present

## 2017-03-25 DIAGNOSIS — Z85828 Personal history of other malignant neoplasm of skin: Secondary | ICD-10-CM | POA: Diagnosis not present

## 2017-03-25 DIAGNOSIS — D044 Carcinoma in situ of skin of scalp and neck: Secondary | ICD-10-CM | POA: Diagnosis not present

## 2017-03-25 DIAGNOSIS — L821 Other seborrheic keratosis: Secondary | ICD-10-CM | POA: Diagnosis not present

## 2017-03-25 DIAGNOSIS — L570 Actinic keratosis: Secondary | ICD-10-CM | POA: Diagnosis not present

## 2017-04-10 DIAGNOSIS — H25811 Combined forms of age-related cataract, right eye: Secondary | ICD-10-CM | POA: Diagnosis not present

## 2017-04-10 DIAGNOSIS — H2511 Age-related nuclear cataract, right eye: Secondary | ICD-10-CM | POA: Diagnosis not present

## 2017-04-25 DIAGNOSIS — Z23 Encounter for immunization: Secondary | ICD-10-CM | POA: Diagnosis not present

## 2017-05-16 DIAGNOSIS — Z961 Presence of intraocular lens: Secondary | ICD-10-CM | POA: Diagnosis not present

## 2017-05-21 DIAGNOSIS — R69 Illness, unspecified: Secondary | ICD-10-CM | POA: Diagnosis not present

## 2017-05-26 DIAGNOSIS — D1801 Hemangioma of skin and subcutaneous tissue: Secondary | ICD-10-CM | POA: Diagnosis not present

## 2017-05-26 DIAGNOSIS — E7849 Other hyperlipidemia: Secondary | ICD-10-CM | POA: Diagnosis not present

## 2017-05-26 DIAGNOSIS — I10 Essential (primary) hypertension: Secondary | ICD-10-CM | POA: Diagnosis not present

## 2017-05-26 DIAGNOSIS — L821 Other seborrheic keratosis: Secondary | ICD-10-CM | POA: Diagnosis not present

## 2017-05-26 DIAGNOSIS — Z85828 Personal history of other malignant neoplasm of skin: Secondary | ICD-10-CM | POA: Diagnosis not present

## 2017-05-26 DIAGNOSIS — R82998 Other abnormal findings in urine: Secondary | ICD-10-CM | POA: Diagnosis not present

## 2017-05-26 DIAGNOSIS — E041 Nontoxic single thyroid nodule: Secondary | ICD-10-CM | POA: Diagnosis not present

## 2017-05-26 DIAGNOSIS — L820 Inflamed seborrheic keratosis: Secondary | ICD-10-CM | POA: Diagnosis not present

## 2017-05-26 DIAGNOSIS — L570 Actinic keratosis: Secondary | ICD-10-CM | POA: Diagnosis not present

## 2017-06-02 DIAGNOSIS — E7849 Other hyperlipidemia: Secondary | ICD-10-CM | POA: Diagnosis not present

## 2017-06-02 DIAGNOSIS — K649 Unspecified hemorrhoids: Secondary | ICD-10-CM | POA: Diagnosis not present

## 2017-06-02 DIAGNOSIS — R2689 Other abnormalities of gait and mobility: Secondary | ICD-10-CM | POA: Diagnosis not present

## 2017-06-02 DIAGNOSIS — I10 Essential (primary) hypertension: Secondary | ICD-10-CM | POA: Diagnosis not present

## 2017-06-02 DIAGNOSIS — Z0000 Encounter for general adult medical examination without abnormal findings: Secondary | ICD-10-CM | POA: Diagnosis not present

## 2017-06-02 DIAGNOSIS — C50919 Malignant neoplasm of unspecified site of unspecified female breast: Secondary | ICD-10-CM | POA: Diagnosis not present

## 2017-06-02 DIAGNOSIS — M5126 Other intervertebral disc displacement, lumbar region: Secondary | ICD-10-CM | POA: Diagnosis not present

## 2017-06-02 DIAGNOSIS — M1990 Unspecified osteoarthritis, unspecified site: Secondary | ICD-10-CM | POA: Diagnosis not present

## 2017-06-02 DIAGNOSIS — E041 Nontoxic single thyroid nodule: Secondary | ICD-10-CM | POA: Diagnosis not present

## 2017-06-02 DIAGNOSIS — K219 Gastro-esophageal reflux disease without esophagitis: Secondary | ICD-10-CM | POA: Diagnosis not present

## 2017-06-09 DIAGNOSIS — M4316 Spondylolisthesis, lumbar region: Secondary | ICD-10-CM | POA: Diagnosis not present

## 2017-06-09 DIAGNOSIS — Z9889 Other specified postprocedural states: Secondary | ICD-10-CM | POA: Diagnosis not present

## 2017-06-12 ENCOUNTER — Encounter: Payer: Self-pay | Admitting: Gynecologic Oncology

## 2017-06-12 ENCOUNTER — Ambulatory Visit: Payer: Medicare HMO | Attending: Gynecologic Oncology | Admitting: Gynecologic Oncology

## 2017-06-12 VITALS — BP 180/76 | HR 74 | Temp 97.9°F | Resp 20 | Ht 60.0 in | Wt 140.7 lb

## 2017-06-12 DIAGNOSIS — E7800 Pure hypercholesterolemia, unspecified: Secondary | ICD-10-CM | POA: Insufficient documentation

## 2017-06-12 DIAGNOSIS — M1990 Unspecified osteoarthritis, unspecified site: Secondary | ICD-10-CM | POA: Diagnosis not present

## 2017-06-12 DIAGNOSIS — Z853 Personal history of malignant neoplasm of breast: Secondary | ICD-10-CM | POA: Diagnosis not present

## 2017-06-12 DIAGNOSIS — I10 Essential (primary) hypertension: Secondary | ICD-10-CM | POA: Diagnosis not present

## 2017-06-12 DIAGNOSIS — Z9012 Acquired absence of left breast and nipple: Secondary | ICD-10-CM | POA: Insufficient documentation

## 2017-06-12 DIAGNOSIS — Z8542 Personal history of malignant neoplasm of other parts of uterus: Secondary | ICD-10-CM | POA: Diagnosis not present

## 2017-06-12 DIAGNOSIS — Z9882 Breast implant status: Secondary | ICD-10-CM | POA: Insufficient documentation

## 2017-06-12 DIAGNOSIS — R918 Other nonspecific abnormal finding of lung field: Secondary | ICD-10-CM | POA: Diagnosis not present

## 2017-06-12 DIAGNOSIS — Z96653 Presence of artificial knee joint, bilateral: Secondary | ICD-10-CM | POA: Diagnosis not present

## 2017-06-12 DIAGNOSIS — E039 Hypothyroidism, unspecified: Secondary | ICD-10-CM | POA: Diagnosis not present

## 2017-06-12 DIAGNOSIS — Z90710 Acquired absence of both cervix and uterus: Secondary | ICD-10-CM | POA: Diagnosis not present

## 2017-06-12 DIAGNOSIS — C541 Malignant neoplasm of endometrium: Secondary | ICD-10-CM | POA: Diagnosis not present

## 2017-06-12 DIAGNOSIS — Z780 Asymptomatic menopausal state: Secondary | ICD-10-CM | POA: Insufficient documentation

## 2017-06-12 DIAGNOSIS — K219 Gastro-esophageal reflux disease without esophagitis: Secondary | ICD-10-CM | POA: Insufficient documentation

## 2017-06-12 DIAGNOSIS — E781 Pure hyperglyceridemia: Secondary | ICD-10-CM | POA: Diagnosis not present

## 2017-06-12 NOTE — Progress Notes (Signed)
GYN ONCOLOGY OFFICE VISIT   Referring MD: Dr. Stann Mainland for the evaluation  PCP Nada Libman  CC:  Chief Complaint  Patient presents with  . Endometrial cancer [facility])    Assessment/Plan:  [name]  is a 76 y.o.  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  Given the very low risk for recurrence (no myometrial invasion and disease restricted to a polyp) we are not recommending adjuvant therapy in accordance with NCCN guidelines.  Recommend 3-4 monthly surveillance exams for the next 2 years followed by 6 monthly exams in the subsequent 3 years.  Underwent BRCA testing without counseling by PCP.  Given h/o breast and serous endometrial cancer would recommend genetic counseling and follow their recommendations regarding additional testing eg for Lynch Syndrome.    Bilateral pulmonary nodules. Incidental finding of numerous bilateral pulmonary nodules <65m at time of imaging for new diagnosis of serous uterine cancer Repeat CT chest with and without contrast 02/2017 with stable findings   She will return to see Dr BSkeet Latchin 334month   Referral to breast surgeon for evaluation of bilateral breast implants.  HPI: Vicki Cervantes a [age]ear old parous woman who is seen in consultation at the request of Dr WeStann Mainlandor high grade serous endometrial cancer.  The patient reports post menopausal spotting since December 2017. She reported this to Dr WeStann Mainlandho performed an [place] showed a normal sized uterus but with a thickened endometrial lining. Endometrial biopsy was taken on 08/29/16 which showed a high grade endometrial cancer, favor serous morphology.  She has a remote history of breast cancer at age [age] received BRCA testing approximately 5 years ago which was negative. Her cancer was treated with surgery but no radiation or chemotherapy.   She has a history of a laparotomy for endometriosis which involved a unilateral salpingo-oophorectomy and possible partial  oophorectomy on the contralateral side (>30 years ago).   Interval Hx: CT C/A/P 09/2016 IMPRESSION: 1. Thickening of the endometrial canal up to 19 mm in fundus, presumably corresponding to the patient's reported endometrial carcinoma. 2. Multiple tiny pulmonary nodules scattered throughout the lungs bilaterally measuring 4 mm or less in size. Nodules of this size are typically considered statistically likely benign. In the setting of known primary malignancy, metastatic disease to the lungs is not excluded, but is not strongly favored on today's examination. Attention on followup studies is recommended to ensure the stability or resolution of these nodules. 3. Subcentimeter low-attenuation lesion in the central aspect of segment 8 of the liver is too small to characterize. This is statistically likely a tiny cyst, but warrants attention on follow-up studies to exclude the possibility of a solitary hepatic metastasis. 4. 1.5 x 1.5 x 1.7 cm well-circumscribed lesion in the proximal stomach. This is of uncertain etiology and significance, and could represent a benign gastric polyp, however, further evaluation with nonemergent endoscopy is suggested in the near future for further evaluation. 5. **An incidental finding of potential clinical significance has been found. 1.1 x 1.6 cm thyroid nodule in the inferior aspect of the right lobe of the thyroid gland. Follow-up evaluation with nonemergent thyroid ultrasound is recommended in the near future to better evaluate this finding. This recommendation follows ACR consensuss guidelines: Managing Incidental Thyroid Nodules Detected on Imaging: White Paper of the ACR Incidental Thyroid Findings Committee. J Am Coll Radiol 2015;12(2):143-150.** 6. Aortic atherosclerosis, in addition to left anterior descending coronary artery disease. Assessment for potential risk factor  modification, dietary therapy or pharmacologic therapy may be warranted, if clinically  indicated.   On 10/22/16 she underwent robotic assisted total hysterectomy, BSO and bilateral pelvic lymphadenectomy with Dr Skeet Latch.  Final pathology revealed a 3cm polyp containing serous carcinoma but with no myometrial invasion, no LVSI and negative nodes.  Vaginal nodule identified 02/2017 and removed Bx 02/2017 Vagina, biopsy, vaginal cuff - BENIGN SQUAMOUS AND GRANULATION TYPE TISSUE. - NO MALIGNANCY IDENTIFIED.  CT Chest/Abd/Pelvis to follow pulmonary nodule and gastric mass IMPRESSION: 1. Stable CT of the chest. Small pulmonary nodules are unchanged when compared with previous exam. 2. No new findings identified. 3. Subcentimeter low-attenuation lesions within the liver are remain too small to characterize but are stable from prior exam. 4. Persistent indeterminate low-attenuation structure within the proximal stomach is unchanged measuring 1.4 cm. Correlation with direct visualization is advised  Underwent BRCA testing.  No evidence of mutation.  Fells well and is without complaints. Social Hx:   Social History   Socioeconomic History  . Marital status: Widowed    Spouse name: Not on file  . Number of children: 3  . Years of education: Not on file  . Highest education level: Not on file  Social Needs  . Financial resource strain: Not on file  . Food insecurity - worry: Not on file  . Food insecurity - inability: Not on file  . Transportation needs - medical: Not on file  . Transportation needs - non-medical: Not on file  Occupational History  . 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 Smoking status: Never Smoker  . Smokeless tobacco: Never Used  Substance and Sexual Activity  . Alcohol use: Yes    Alcohol/week: 0.0 oz    Comment: Wine Occas.  . Drug use: No  . Sexual activity: No  Other Topics Concern  . Not on file  Social History Narrative  . Not on file  Will spend Vicki Cervantes in [place] with her  daughter  Past Surgical Hx:  Past Surgical History:  Procedure Laterality Date  . EUS N/A 01/07/2013   Procedure: UPPER ENDOSCOPIC ULTRASOUND (EUS) LINEAR;  Surgeon: Milus Banister, MD;  Location: [facility];  Service: Endoscopy;  Laterality: N/A;  . EUS N/A 01/05/2015   Procedure: UPPER ENDOSCOPIC ULTRASOUND (EUS) LINEAR;  Surgeon: Milus Banister, MD;  Location: [facility];  Service: Endoscopy;  Laterality: N/A;  . FOOT SURGERY Left 2009-2010   x2. Surgery in 2011  . LEFT HEART CATHETERIZATION WITH CORONARY ANGIOGRAM N/A 01/24/2014   Procedure: LEFT HEART CATHETERIZATION WITH CORONARY ANGIOGRAM;  Surgeon: Peter M Martinique, MD;  Location: [facility] CATH LAB;  Service: Cardiovascular;  Laterality: N/A;  . LUMBAR [place] SURGERY  01-01-13  . MASTECTOMY, RADICAL Left 1982  . OVARY SURGERY  1972-1973  . ROBOTIC ASSISTED TOTAL HYSTERECTOMY WITH BILATERAL SALPINGO OOPHERECTOMY N/A 10/22/2016   Procedure: XI ROBOTIC ASSISTED TOTAL HYSTERECTOMY WITH RIGHT SALPINGO OOPHORECTOMY; INJECTION OF GREEN DYE WITH EXCISION OF BILATERAL PELVIC LYMPH NODES;  Surgeon: Janie Morning, MD;  Location: [facility];  Service: Gynecology;  Laterality: N/A;  . ROTATOR CUFF REPAIR Left   . SENTINEL NODE BIOPSY N/A 10/22/2016   Procedure: SENTINEL NODE BIOPSY;  Surgeon: Janie Morning, MD;  Location: [facility];  Service: Gynecology;  Laterality: N/A;  . SIMPLE MASTECTOMY Left 1982  . TOTAL KNEE ARTHROPLASTY Bilateral 2009-2010   x2    Past Medical Hx:  Past Medical History:  Diagnosis Date  . Arthritis   .  Breast cancer ([place])     LEFT, '82-left breast cancer-surgery only  . Complication of anesthesia    nausea  . Diverticulosis   . Esophageal stricture   . GERD (gastroesophageal reflux disease)   . Hemorrhoids   . Hiatal hernia   . High triglycerides   . Hypercholesteremia   . Hypertension   . Hypothyroidism   . Osteoarthritis   . PONV (postoperative nausea and vomiting)     Past Gynecological History:  G2P2 No LMP  recorded. Patient is postmenopausal.  Family Hx:  Family History  Problem Relation Age of Onset  . Heart disease Mother   . Heart attack Father   . Heart attack Brother     Review of Systems:  Constitutional  Feels well,    ENT Normal appearing ears and nares bilaterally Skin/Breast  No rash, sores, jaundice, itching, dryness Cardiovascular  No chest pain, shortness of breath, or edema  Pulmonary  No cough or wheeze.  Gastro Intestinal  No nausea, vomitting, or diarrhoea. No bright red blood per rectum, no abdominal pain, change in bowel movement, or constipation.  Genito Urinary  No frequency, urgency, dysuria, no vaginal bleeding Musculo Skeletal  No myalgia, arthralgia, joint swelling or pain  Neurologic  No weakness, numbness, change in gait,  Psychology  No depression, anxiety, insomnia.   Vitals:  Blood pressure (!) 180/76, pulse 74, temperature 97.9 F (36.6 C), temperature source Oral, resp. rate 20, height 5' (1.524 m), weight 140 lb 11.2 oz (63.8 kg), SpO2 97 %.. Body mass index is 27.48 kg/m.   Physical Exam: WD in NAD Neck  Supple NROM, without any enlargements.  Lymph Node Survey No cervical supraclavicular or inguinal adenopathy Cardiovascular  Pulse normal rate, regularity and rhythm.  Lungs  Clear to auscultation bilateraly. Good air movement.  Skin  No rash/lesions/breakdown  Psychiatry  Alert and oriented to person, place, and time  Abdomen  Normoactive bowel sounds, abdomen soft, non-tender and overweight without evidence of hernia. Back No CVA tenderness Genito Urinary  Vulva/vagina: Normal external female genitalia.  No lesions. No discharge or bleeding. Vaginal cuff: Atrophic vagina NO masses Rectal  Good tone no masses Extremities  No bilateral cyanosis, clubbing or edema.    Janie Morning, MD  06/12/2017, 10:36 AM

## 2017-06-12 NOTE — Patient Instructions (Signed)
Plan on following up in three months or sooner if needed.  You will receive a phone call from the [facility] with an appointment to meet with the Genetics Counselor.  Recommendations for surgeons to follow your breast is Dr. Rolm Bookbinder, Dr. Mamie Laurel, Dr. Lucia Gaskins at [facility].  Their number is [phone].

## 2017-06-16 ENCOUNTER — Telehealth: Payer: Self-pay | Admitting: *Deleted

## 2017-06-16 NOTE — Addendum Note (Signed)
Addended by: Joylene John D on: 06/16/2017 11:23 AM   Modules accepted: Orders

## 2017-06-16 NOTE — Telephone Encounter (Signed)
Called and left a message for the patient to call the office back. Patient needs to be given the appt for genetics

## 2017-06-17 ENCOUNTER — Telehealth: Payer: Self-pay | Admitting: *Deleted

## 2017-06-17 DIAGNOSIS — H33103 Unspecified retinoschisis, bilateral: Secondary | ICD-10-CM | POA: Diagnosis not present

## 2017-06-17 DIAGNOSIS — H35372 Puckering of macula, left eye: Secondary | ICD-10-CM | POA: Diagnosis not present

## 2017-06-17 DIAGNOSIS — H43813 Vitreous degeneration, bilateral: Secondary | ICD-10-CM | POA: Diagnosis not present

## 2017-06-17 NOTE — Telephone Encounter (Signed)
Called and spoke with the patient gave her the genetics appt for December 10th at 2pm.

## 2017-06-22 ENCOUNTER — Telehealth: Payer: Self-pay | Admitting: Genetic Counselor

## 2017-06-22 NOTE — Telephone Encounter (Signed)
Called the patient to let her know that the cancer center will be closed tomorrow.  We will call her back to r/s her appointment.  Patient voiced her understanding.

## 2017-06-23 ENCOUNTER — Encounter: Payer: Medicare HMO | Admitting: Genetics

## 2017-06-23 ENCOUNTER — Other Ambulatory Visit: Payer: Medicare HMO

## 2017-06-24 ENCOUNTER — Encounter: Payer: Medicare HMO | Admitting: Genetics

## 2017-06-25 ENCOUNTER — Ambulatory Visit (HOSPITAL_BASED_OUTPATIENT_CLINIC_OR_DEPARTMENT_OTHER): Payer: Medicare HMO | Admitting: Genetics

## 2017-06-25 ENCOUNTER — Encounter: Payer: Self-pay | Admitting: Genetics

## 2017-06-25 ENCOUNTER — Other Ambulatory Visit: Payer: Medicare HMO

## 2017-06-25 DIAGNOSIS — C541 Malignant neoplasm of endometrium: Secondary | ICD-10-CM

## 2017-06-25 DIAGNOSIS — Z800 Family history of malignant neoplasm of digestive organs: Secondary | ICD-10-CM | POA: Diagnosis not present

## 2017-06-25 DIAGNOSIS — Z853 Personal history of malignant neoplasm of breast: Secondary | ICD-10-CM

## 2017-06-25 DIAGNOSIS — Z7183 Encounter for nonprocreative genetic counseling: Secondary | ICD-10-CM

## 2017-06-25 NOTE — Progress Notes (Signed)
REFERRING PROVIDER: Dorothyann Gibbs, NP [place], [place] [address]  PRIMARY PROVIDER:  Burnard Bunting, MD  PRIMARY REASON FOR VISIT:  1. Endometrial cancer ([place])   2. History of breast cancer      HISTORY OF PRESENT ILLNESS:   Vicki Cervantes, a 76 y.o. female, was seen for a [facility] cancer genetics consultation at the request of Dr. Reynaldo Minium due to a personal and family history of cancer.  Vicki Cervantes presents to clinic today to discuss the possibility of a hereditary predisposition to cancer, genetic testing, and to further clarify her future cancer risks, as well as potential cancer risks for family members.   In 1982, at the age of 80, Vicki Cervantes was diagnosed with left breast cancer of the This was treated with a left mastectomy.  In 2018 at the age of 70 she developed high grade endometrial cancer.  She had a total hysterectomy with right sapilngo-oophorectomy on 10-22-2016.  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  Her results are unavailable for review at this time.    HORMONAL RISK FACTORS:  First live birth at age 64.  Ovaries intact: no.  Hysterectomy: yes. 10/22/2016 Menopausal status: postmenopausal.  Colonoscopy: yes; 2003 hyperplastic polyp.   Past Medical History:  Diagnosis Date  . Arthritis   . Breast cancer ([place])     LEFT, '82-left breast cancer-surgery only  . Complication of anesthesia    nausea  . Diverticulosis   . Esophageal stricture   . GERD (gastroesophageal reflux disease)   . Hemorrhoids   . Hiatal hernia   . High triglycerides   . Hypercholesteremia   . Hypertension   . Hypothyroidism   . Osteoarthritis   . PONV (postoperative nausea and vomiting)     Past Surgical History:  Procedure Laterality Date  .  EUS N/A 01/07/2013   Procedure: UPPER ENDOSCOPIC ULTRASOUND (EUS) LINEAR;  Surgeon: Milus Banister, MD;  [facility];  Service: Endoscopy;  Laterality: N/A;  . EUS N/A 01/05/2015   Procedure: UPPER ENDOSCOPIC ULTRASOUND (EUS) LINEAR;  Surgeon: Milus Banister, MD;  [facility];  Service: Endoscopy;  Laterality: N/A;  . FOOT SURGERY Left 2009-2010   x2. Surgery in 2011  . LEFT HEART CATHETERIZATION WITH CORONARY ANGIOGRAM N/A 01/24/2014   Procedure: LEFT HEART CATHETERIZATION WITH CORONARY ANGIOGRAM;  Surgeon: Peter M Martinique, MD;  Location: [facility] CATH LAB;  Service: Cardiovascular;  Laterality: N/A;  . LUMBAR [place] SURGERY  01-01-13  . MASTECTOMY, RADICAL Left 1982  . OVARY SURGERY  1972-1973  . ROBOTIC ASSISTED TOTAL HYSTERECTOMY WITH BILATERAL SALPINGO OOPHERECTOMY N/A 10/22/2016   Procedure: XI ROBOTIC ASSISTED TOTAL HYSTERECTOMY WITH RIGHT SALPINGO OOPHORECTOMY; INJECTION OF GREEN DYE WITH EXCISION OF BILATERAL PELVIC LYMPH NODES;  Surgeon: Janie Morning, MD;  [facility];  Service: Gynecology;  Laterality: N/A;  . ROTATOR CUFF REPAIR Left   . SENTINEL NODE BIOPSY N/A 10/22/2016   Procedure: SENTINEL NODE BIOPSY;  Surgeon: Janie Morning, MD;  Location: [facility];  Service: Gynecology;  Laterality: N/A;  . SIMPLE MASTECTOMY Left 1982  . TOTAL KNEE ARTHROPLASTY Bilateral 2009-2010   x2    Social History   Socioeconomic History  . Marital status: Widowed  Spouse name: Not on file  . Number of children: 3  . Years of education: Not on file  . Highest education level: Not on file  Social Needs  . Financial resource strain: Not on file  . Food insecurity - worry: Not on file  . Food insecurity - inability: Not on file  . Transportation needs - medical: Not on file  . Transportation needs - non-medical: Not on file  Occupational History  . 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 Smoking status: Never Smoker  . Smokeless  tobacco: Never Used  Substance and Sexual Activity  . Alcohol use: Yes    Alcohol/week: 0.0 oz    Comment: Wine Occas.  . Drug use: No  . Sexual activity: No  Other Topics Concern  . Not on file  Social History Narrative  . Not on file     FAMILY HISTORY:  We obtained a detailed, 4-generation family history.  Significant diagnoses are listed below:  Family History  Problem Relation Age of Onset  . Heart disease Mother   . Other Mother        blood disease  . Heart attack Father   . Heart attack Brother   . Stomach cancer Maternal Aunt    Vicki Cervantes has a 78 year-old daughter and 2 fraternal twin sons who are 38 with no history of cancer.  Vicki Cervantes has 8 grandchildren with no history of cancer. Vicki Cervantes has a brother who is 34 with no history of cancer.  He has 3 daughters with no history of cancer.  Vicki Cervantes has a fraternal twin sister who has no history of cancer and no children.    Vicki Cervantes father died at 39.  He might have had a heart attack, but she reports that he came back from the war with problems and this might have been the cause of death.  She knows that her father had many siblings, but does not know any information about her paternal aunts/uncles/cousins and grandparents.  There has been no family history of cancer she is aware of, but has no contact with these relatives.   Vicki Cervantes mother died at the age of 82 and had heart disease and a blood disease.  She had 7 siblings.  Vicki Cervantes maternal aunt had stomach cancer.  She does not know any other information about her maternal aunts/uncles/cousins and grandparents.  There is no other history of cancer she is aware of, but does not have contact with these relatives.    Vicki Cervantes is unaware of previous family history of genetic testing for hereditary cancer risks. Patient's maternal ancestors are of [place] descent, and paternal ancestors are of [place] descent. There is no reported Ashkenazi Jewish  ancestry. There is no known consanguinity.  GENETIC COUNSELING ASSESSMENT: Vicki Cervantes is a 76 y.o. 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 We, therefore, discussed and recommended the following at today's visit.   DISCUSSION: We reviewed the characteristics, features and inheritance patterns of hereditary cancer syndromes. We also discussed genetic testing, including the appropriate family members to test, the process of testing, insurance coverage and turn-around-time for results. We discussed the implications of a negative, positive and/or variant of uncertain significant result. We recommended Vicki Cervantes pursue genetic testing for the Multi-Cancer gene panel.   We discussed that only 5-10% of cancers are associated with a Hereditary cancer predisposition syndrome.  One of the most common hereditary cancer syndromes that increases breast cancer risk is called Hereditary Breast and Ovarian Cancer (HBOC) syndrome.  This syndrome is caused by mutations in the BRCA1 and BRCA2 genes.  This syndrome increases an individual's lifetime risk to develop breast, ovarian, pancreatic, and other types of cancer.  There are also many other cancer predisposition syndromes caused by mutations in several other genes.  We briefly discussed that mutations in the Lynch Syndrome genes are associated with endometrial, colon, and other types of cancer.   We discussed that if she is found to have a mutation in one of these genes, it may impact future medical management recommendations such as increased cancer screenings and consideration of risk reducing surgeries.  A positive result could also have implications for the patient's family members.  A Negative result would mean we were unable to identify a hereditary component to her cancer, but does not rule out the possibility of a hereditary basis for her cancer.  There could be mutations that are undetectable by  current technology, or in genes not yet tested or identified to increase cancer risk.    We discussed the potential to find a Variant of Uncertain Significance or VUS.  These are variants that have not yet been identified as pathogenic or benign, and it is unknown if this variant is associated with increased cancer risk or if this is a normal finding.  Most VUS's are reclassified to benign or likely benign.   It should not be used to make medical management decisions. With time, we suspect the lab will determine the significance of any VUS's identified if any.   Based on Vicki Cervantes's personal history of cancer, she meets medical criteria for genetic testing. Despite that she meets criteria, she may still have an out of pocket cost. We discussed that if her out of pocket cost for testing is over $100, the laboratory will call and confirm whether she wants to proceed with testing.  If the out of pocket cost of testing is less than $100 she will be billed by the genetic testing laboratory. Typically patients with medicare insurance have $0 out of pocket cost.   PLAN: After considering the risks, benefits, and limitations, Vicki Cervantes  provided informed consent to pursue genetic testing and the blood sample was sent to [facility] for analysis of the Multi-Cancer Panel. Results should be available within approximately 2-3 weeks' time, at which point they will be disclosed by telephone to Vicki Cervantes, as will any additional recommendations warranted by these results. Vicki Cervantes will receive a summary of her genetic counseling visit and a copy of her results once available. This information will also be available in Epic. We encouraged Ms. Dungee to remain in contact with cancer genetics annually so that we can continuously update the family history and inform her of any changes in cancer genetics and testing that may be of benefit for her family. Ms. Hopple questions were answered to her satisfaction  today. Our contact information was provided should additional questions or concerns arise.  Lastly, we encouraged Ms. Cipriani to remain in contact with cancer genetics annually so that we can continuously update the family history and inform her of any changes in cancer genetics and testing that may be of benefit for this family.   Ms.  Lacross questions were answered to her satisfaction today. Our contact information was provided should additional questions or concerns arise. Thank you for the referral and allowing [place] to  share in the care of your patient.   Tana Felts, MS Genetic Counselor [email or web address] phone: [phone]  The patient was seen for a total of 30 minutes in face-to-face genetic counseling.

## 2017-07-07 ENCOUNTER — Telehealth: Payer: Self-pay | Admitting: Genetics

## 2017-07-07 NOTE — Telephone Encounter (Signed)
Revealed negative genetic testing.  Revealed that a VUS in APC was identified.   This normal result is reassuring and indicates that it is unlikely Vicki Cervantes's cancer is due to a hereditary cause.  It is unlikely that there is an increased risk of another cancer due to a mutation in one of these genes.  However, genetic testing is not perfect, and cannot definitively rule out a hereditary cause.  It will be important for her to keep in contact with genetics to learn if any additional testing may be needed in the future.   Women in the family should still get the breast screening recommended to them and are at some increased risk for breast cancer based on the family history.

## 2017-07-14 ENCOUNTER — Encounter: Payer: Self-pay | Admitting: Genetics

## 2017-07-14 ENCOUNTER — Ambulatory Visit: Payer: Self-pay | Admitting: Genetics

## 2017-07-14 DIAGNOSIS — Z1379 Encounter for other screening for genetic and chromosomal anomalies: Secondary | ICD-10-CM

## 2017-07-14 DIAGNOSIS — Z853 Personal history of malignant neoplasm of breast: Secondary | ICD-10-CM

## 2017-07-14 DIAGNOSIS — C541 Malignant neoplasm of endometrium: Secondary | ICD-10-CM

## 2017-07-14 DIAGNOSIS — Z800 Family history of malignant neoplasm of digestive organs: Secondary | ICD-10-CM

## 2017-07-14 NOTE — Progress Notes (Signed)
HPI: Ms. Prine was previously seen in the [place] clinic on 06/25/2017 due to a personal and family history of cancer and concerns regarding a hereditary predisposition to cancer. Please refer to our prior cancer genetics clinic note for more information regarding Ms. Skop's medical, social and family histories, and our assessment and recommendations, at the time. Ms. Cirillo recent genetic test results were disclosed to her, as well as recommendations warranted by these results. These results and recommendations are discussed in more detail below.  CANCER HISTORY:    Endometrial cancer ([place])   09/30/2016 Initial Diagnosis    Endometrial cancer ([place])      07/04/2017 Genetic Testing    The patient had genetic testing due to a personal history of breast and uterine cancer, and a family history of stomach cancer.  The Multi-Cancer Panel was ordered. The Multi-Cancer Panel offered by [facility] includes sequencing and/or deletion duplication testing of the following 83 genes: ALK, APC, ATM, AXIN2,BAP1,  BARD1, BLM, BMPR1A, BRCA1, BRCA2, BRIP1, CASR, CDC73, CDH1, CDK4, CDKN1B, CDKN1C, CDKN2A (p14ARF), CDKN2A (p16INK4a), CEBPA, CHEK2, CTNNA1, DICER1, DIS3L2, EGFR (c.2369C>T, p.Thr790Met variant only), EPCAM (Deletion/duplication testing only), FH, FLCN, GATA2, GPC3, GREM1 (Promoter region deletion/duplication testing only), HOXB13 (c.251G>A, p.Gly84Glu), HRAS, KIT, MAX, MEN1, MET, MITF (c.952G>A, p.Glu318Lys variant only), MLH1, MSH2, MSH3, MSH6, MUTYH, NBN, NF1, NF2, NTHL1, PALB2, PDGFRA, PHOX2B, PMS2, POLD1, POLE, POT1, PRKAR1A, PTCH1, PTEN, RAD50, RAD51C, RAD51D, RB1, RECQL4, RET, RUNX1, SDHAF2, SDHA (sequence changes only), SDHB, SDHC, SDHD, SMAD4, SMARCA4, SMARCB1, SMARCE1, STK11, SUFU, TERC, TERT, TMEM127, TP53, TSC1, TSC2, VHL, WRN and WT1.   Results: No pathogenic variants were identified.  A variant of uncertain significance in the gene APC was identified.  c.791A>G (p.Gln264Arg).   The date of this test report is 07/04/2017.         FAMILY HISTORY:  We obtained a detailed, 4-generation family history.  Significant diagnoses are listed below: Family History  Problem Relation Age of Onset  . Heart disease Mother   . Other Mother        blood disease  . Heart attack Father   . Heart attack Brother   . Stomach cancer Maternal Aunt    Ms. North has a 76 year-old daughter and 2 fraternal twin sons who are 31 with no history of cancer.  Ms. Hubner has 8 grandchildren with no history of cancer. Ms. Novell has a brother who is 9 with no history of cancer.  He has 3 daughters with no history of cancer.  Ms. Ponti has a fraternal twin sister who has no history of cancer and no children.    Ms. Lapenna father died at 87.  He might have had a heart attack, but she reports that he came back from the war with problems and this might have been the cause of death.  She knows that her father had many siblings, but does not know any information about her paternal aunts/uncles/cousins and grandparents.  There has been no family history of cancer she is aware of, but has no contact with these relatives.   Ms. Barrette mother died at the age of 70 and had heart disease and a blood disease.  She had 7 siblings.  Ms. Mcbrien maternal aunt had stomach cancer.  She does not know any other information about her maternal aunts/uncles/cousins and grandparents.  There is no other history of cancer she is aware of, but does not have contact with these relatives.    Ms. Soulliere is unaware  of previous family history of genetic testing for hereditary cancer risks. Patient's maternal ancestors are of [place] descent, and paternal ancestors are of [place] descent. There is no reported Ashkenazi Jewish ancestry. There is no known consanguinity.  GENETIC TEST RESULTS: Genetic testing performed through [facility]'s Multi-Cancer Panel reported out on 07/04/2017 showed no pathogenic mutations. The  Multi-Cancer Panel offered by [facility] includes sequencing and/or deletion duplication testing of the following 83 genes: ALK, APC, ATM, AXIN2,BAP1,  BARD1, BLM, BMPR1A, BRCA1, BRCA2, BRIP1, CASR, CDC73, CDH1, CDK4, CDKN1B, CDKN1C, CDKN2A (p14ARF), CDKN2A (p16INK4a), CEBPA, CHEK2, CTNNA1, DICER1, DIS3L2, EGFR (c.2369C>T, p.Thr790Met variant only), EPCAM (Deletion/duplication testing only), FH, FLCN, GATA2, GPC3, GREM1 (Promoter region deletion/duplication testing only), HOXB13 (c.251G>A, p.Gly84Glu), HRAS, KIT, MAX, MEN1, MET, MITF (c.952G>A, p.Glu318Lys variant only), MLH1, MSH2, MSH3, MSH6, MUTYH, NBN, NF1, NF2, NTHL1, PALB2, PDGFRA, PHOX2B, PMS2, POLD1, POLE, POT1, PRKAR1A, PTCH1, PTEN, RAD50, RAD51C, RAD51D, RB1, RECQL4, RET, RUNX1, SDHAF2, SDHA (sequence changes only), SDHB, SDHC, SDHD, SMAD4, SMARCA4, SMARCB1, SMARCE1, STK11, SUFU, TERC, TERT, TMEM127, TP53, TSC1, TSC2, VHL, WRN and WT1. .  A variant of uncertain significance (VUS) in a gene called APC was also noted. c.791A>G (p.Gln264Arg)  The test report will be scanned into EPIC and will be located under the Molecular Pathology section of the Results Review tab.A portion of the result report is included below for reference.     We discussed with Ms. Illescas that because current genetic testing is not perfect, it is possible there may be a gene mutation in one of these genes that current testing cannot detect, but that chance is small. We also discussed, that there could be another gene that has not yet been discovered, or that we have not yet tested, that is responsible for the cancer diagnoses in the family. It is also possible there is a hereditary cause for the cancer in the family that Ms. Yiu did not inherit and therefore was not identified in her testing.  Therefore, it is important to remain in touch with cancer genetics in the future so that we can continue to offer Ms. Deupree the most up to date genetic testing.   Regarding the VUS in  APC: At this time, it is unknown if this variant is associated with increased cancer risk or if this is a normal finding, but most variants such as this get reclassified to being inconsequential. It should not be used to make medical management decisions. With time, we suspect the lab will determine the significance of this variant, if any. If we do learn more about it, we will try to contact Ms. Daniel to discuss it further. However, it is important to stay in touch with [place] periodically and keep the address and phone number up to date.  ADDITIONAL GENETIC TESTING: We discussed with Ms. Matarazzo that her genetic testing was fairly extensive.  If there are are genes identified to increase cancer risk that can be analyzed in the future, we would be happy to discuss and coordinate this testing at that time.     CANCER SCREENING RECOMMENDATIONS: This normal result indicates that it is unlikely Ms. Matus has an increased risk of cancer due to a mutation in one of these genes.  However genetic testing is not perfect and cannot definitively rule out a hereditary predisposition to cancer.  It is possible there are mutations undetectable by current technology or in genes not yet tested or discovered to increase cancer risk.   Ms. Finnicum was advised to continue following the  cancer screening guidelines provided by her primary healthcare providers. Other factors such as her personal and family history may still affect her cancer risk.    RECOMMENDATIONS FOR FAMILY MEMBERS: Women in this family might be at some increased risk of developing cancer, over the general population risk, simply due to the family history of cancer. We recommended women in this family have a yearly mammogram beginning at age 31, or 74 years younger than the earliest onset of cancer, an annual clinical breast exam, and perform monthly breast self-exams. Women in this family should also have a gynecological exam as recommended by their primary  provider. All family members should have a colonoscopy by age 1.  All family members should inform their physicians about the family history of cancer so their doctors can make the most appropriate screening recommendations for them.   FOLLOW-UP: Lastly, we discussed with Ms. Jain that cancer genetics is a rapidly advancing field and it is possible that new genetic tests will be appropriate for her and/or her family members in the future. We encouraged her to remain in contact with cancer genetics on an annual basis so we can update her personal and family histories and let her know of advances in cancer genetics that may benefit this family.   Our contact number was provided. Ms. Throckmorton questions were answered to her satisfaction, and she knows she is welcome to call us at anytime with additional questions or concerns.   Ferol Luz, MS Genetic Counselor [email or web address]

## 2017-07-16 DIAGNOSIS — S76019A Strain of muscle, fascia and tendon of unspecified hip, initial encounter: Secondary | ICD-10-CM | POA: Diagnosis not present

## 2017-08-14 ENCOUNTER — Telehealth: Payer: Self-pay | Admitting: *Deleted

## 2017-08-14 NOTE — Telephone Encounter (Signed)
Called and moved the patient's appt on February 28th from 9:15am to 10:30am.

## 2017-08-19 DIAGNOSIS — J309 Allergic rhinitis, unspecified: Secondary | ICD-10-CM | POA: Diagnosis not present

## 2017-08-19 DIAGNOSIS — M810 Age-related osteoporosis without current pathological fracture: Secondary | ICD-10-CM | POA: Diagnosis not present

## 2017-08-19 DIAGNOSIS — L309 Dermatitis, unspecified: Secondary | ICD-10-CM | POA: Diagnosis not present

## 2017-08-19 DIAGNOSIS — E039 Hypothyroidism, unspecified: Secondary | ICD-10-CM | POA: Diagnosis not present

## 2017-08-19 DIAGNOSIS — I10 Essential (primary) hypertension: Secondary | ICD-10-CM | POA: Diagnosis not present

## 2017-08-19 DIAGNOSIS — N183 Chronic kidney disease, stage 3 (moderate): Secondary | ICD-10-CM | POA: Diagnosis not present

## 2017-09-10 ENCOUNTER — Encounter: Payer: Self-pay | Admitting: Internal Medicine

## 2017-09-11 ENCOUNTER — Inpatient Hospital Stay: Payer: Medicare HMO | Attending: Gynecologic Oncology | Admitting: Gynecologic Oncology

## 2017-09-11 ENCOUNTER — Encounter: Payer: Self-pay | Admitting: Gynecologic Oncology

## 2017-09-11 VITALS — BP 140/77 | HR 64 | Temp 98.4°F | Resp 18 | Wt 137.6 lb

## 2017-09-11 DIAGNOSIS — Z780 Asymptomatic menopausal state: Secondary | ICD-10-CM | POA: Diagnosis not present

## 2017-09-11 DIAGNOSIS — Z90710 Acquired absence of both cervix and uterus: Secondary | ICD-10-CM | POA: Diagnosis not present

## 2017-09-11 DIAGNOSIS — I700 Atherosclerosis of aorta: Secondary | ICD-10-CM | POA: Diagnosis not present

## 2017-09-11 DIAGNOSIS — C541 Malignant neoplasm of endometrium: Secondary | ICD-10-CM | POA: Insufficient documentation

## 2017-09-11 DIAGNOSIS — Z85828 Personal history of other malignant neoplasm of skin: Secondary | ICD-10-CM | POA: Diagnosis not present

## 2017-09-11 DIAGNOSIS — Z90722 Acquired absence of ovaries, bilateral: Secondary | ICD-10-CM | POA: Insufficient documentation

## 2017-09-11 DIAGNOSIS — L570 Actinic keratosis: Secondary | ICD-10-CM | POA: Diagnosis not present

## 2017-09-11 DIAGNOSIS — E7800 Pure hypercholesterolemia, unspecified: Secondary | ICD-10-CM

## 2017-09-11 DIAGNOSIS — I10 Essential (primary) hypertension: Secondary | ICD-10-CM

## 2017-09-11 DIAGNOSIS — K219 Gastro-esophageal reflux disease without esophagitis: Secondary | ICD-10-CM | POA: Diagnosis not present

## 2017-09-11 DIAGNOSIS — Z9012 Acquired absence of left breast and nipple: Secondary | ICD-10-CM | POA: Insufficient documentation

## 2017-09-11 DIAGNOSIS — R918 Other nonspecific abnormal finding of lung field: Secondary | ICD-10-CM

## 2017-09-11 DIAGNOSIS — M1990 Unspecified osteoarthritis, unspecified site: Secondary | ICD-10-CM | POA: Diagnosis not present

## 2017-09-11 DIAGNOSIS — E039 Hypothyroidism, unspecified: Secondary | ICD-10-CM

## 2017-09-11 DIAGNOSIS — I2510 Atherosclerotic heart disease of native coronary artery without angina pectoris: Secondary | ICD-10-CM | POA: Diagnosis not present

## 2017-09-11 DIAGNOSIS — D485 Neoplasm of uncertain behavior of skin: Secondary | ICD-10-CM | POA: Diagnosis not present

## 2017-09-11 DIAGNOSIS — Z853 Personal history of malignant neoplasm of breast: Secondary | ICD-10-CM | POA: Diagnosis not present

## 2017-09-11 DIAGNOSIS — Z800 Family history of malignant neoplasm of digestive organs: Secondary | ICD-10-CM | POA: Diagnosis not present

## 2017-09-11 DIAGNOSIS — D044 Carcinoma in situ of skin of scalp and neck: Secondary | ICD-10-CM | POA: Diagnosis not present

## 2017-09-11 NOTE — Patient Instructions (Signed)
Plan on following up in three months with lab work prior.  Please call for any questions or concerns.

## 2017-09-12 ENCOUNTER — Other Ambulatory Visit: Payer: Self-pay | Admitting: Gynecologic Oncology

## 2017-09-12 DIAGNOSIS — Z79810 Long term (current) use of selective estrogen receptor modulators (SERMs): Secondary | ICD-10-CM

## 2017-09-12 NOTE — Progress Notes (Signed)
Labs for [employer] monitoring

## 2017-09-16 NOTE — Progress Notes (Signed)
GYN ONCOLOGY OFFICE VISIT   Referring MD: Dr. Stann Mainland  PCP Vicki Cervantes  CC:  Chief Complaint  Patient presents with  . Endometrial cancer [facility])    Assessment/Plan:  [name]  is a 76 y.o.  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  Given the very low risk for recurrence (no myometrial invasion and disease restricted to a polyp) we are not recommending adjuvant therapy in accordance with NCCN guidelines.  Recommend 3-4 monthly surveillance exams for the next 2 years followed by 6 monthly exams in the subsequent 3 years.  Underwent BRCA testing without counseling by PCP.  Given h/o breast and serous endometrial cancer would recommend genetic counseling and follow their recommendations regarding additional testing eg for Lynch Syndrome.    Bilateral pulmonary nodules. Incidental finding of numerous bilateral pulmonary nodules <17m at time of imaging for new diagnosis of serous uterine cancer Repeat CT chest with and without contrast 02/2017 with stable findings   She will return to see Dr BSkeet Latchin 354month    HPI: Vicki Cervantes a [age]ear old parous woman who is seen in consultation at the request of Dr WeStann Mainlandor high grade serous endometrial cancer.  The patient reports post menopausal spotting since December 2017. She reported this to Dr WeStann Mainlandho performed an [place] showed a normal sized uterus but with a thickened endometrial lining. Endometrial biopsy was taken on 08/29/16 which showed a high grade endometrial cancer, favor serous morphology.  She has a remote history of breast cancer at age [age] received BRCA testing approximately 5 years ago which was negative. Her cancer was treated with surgery but no radiation or chemotherapy.   She has a history of a laparotomy for endometriosis which involved a unilateral salpingo-oophorectomy and possible partial oophorectomy on the contralateral side (>30 years ago).   Interval Hx: CT C/A/P  09/2016 IMPRESSION: 1. Thickening of the endometrial canal up to 19 mm in fundus, presumably corresponding to the patient's reported endometrial carcinoma. 2. Multiple tiny pulmonary nodules scattered throughout the lungs bilaterally measuring 4 mm or less in size. Nodules of this size are typically considered statistically likely benign. In the setting of known primary malignancy, metastatic disease to the lungs is not excluded, but is not strongly favored on today's examination. Attention on followup studies is recommended to ensure the stability or resolution of these nodules. 3. Subcentimeter low-attenuation lesion in the central aspect of segment 8 of the liver is too small to characterize. This is statistically likely a tiny cyst, but warrants attention on follow-up studies to exclude the possibility of a solitary hepatic metastasis. 4. 1.5 x 1.5 x 1.7 cm well-circumscribed lesion in the proximal stomach. This is of uncertain etiology and significance, and could represent a benign gastric polyp, however, further evaluation with nonemergent endoscopy is suggested in the near future for further evaluation. 5. **An incidental finding of potential clinical significance has been found. 1.1 x 1.6 cm thyroid nodule in the inferior aspect of the right lobe of the thyroid gland. Follow-up evaluation with nonemergent thyroid ultrasound is recommended in the near future to better evaluate this finding. This recommendation follows ACR consensuss guidelines: Managing Incidental Thyroid Nodules Detected on Imaging: White Paper of the ACR Incidental Thyroid Findings Committee. J Am Coll Radiol 2015;12(2):143-150.** 6. Aortic atherosclerosis, in addition to left anterior descending coronary artery disease. Assessment for potential risk factor modification, dietary therapy or pharmacologic therapy may be warranted, if clinically indicated.  On 10/22/16 she underwent robotic assisted total hysterectomy, BSO and  bilateral pelvic lymphadenectomy with Dr Skeet Latch.  Final pathology revealed a 3cm polyp containing serous carcinoma but with no myometrial invasion, no LVSI and negative nodes.  Vaginal nodule identified 02/2017 and removed Bx 02/2017 Vagina, biopsy, vaginal cuff - BENIGN SQUAMOUS AND GRANULATION TYPE TISSUE. - NO MALIGNANCY IDENTIFIED.  CT Chest/Abd/Pelvis to follow pulmonary nodule and gastric mass IMPRESSION: 1. Stable CT of the chest. Small pulmonary nodules are unchanged when compared with previous exam. 2. No new findings identified. 3. Subcentimeter low-attenuation lesions within the liver are remain too small to characterize but are stable from prior exam. 4. Persistent indeterminate low-attenuation structure within the proximal stomach is unchanged measuring 1.4 cm. Correlation with direct visualization is advised  Underwent BRCA testing.  No evidence of mutation.  Fells well and is without complaints.  Social Hx:   Social History   Socioeconomic History  . Marital status: Widowed    Spouse name: Not on file  . Number of children: 3  . Years of education: Not on file  . Highest education level: Not on file  Social Needs  . Financial resource strain: Not on file  . Food insecurity - worry: Not on file  . Food insecurity - inability: Not on file  . Transportation needs - medical: Not on file  . Transportation needs - non-medical: Not on file  Occupational History  . 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 Smoking status: Never Smoker  . Smokeless tobacco: Never Used  Substance and Sexual Activity  . Alcohol use: Yes    Alcohol/week: 0.0 oz    Comment: Wine Occas.  . Drug use: No  . Sexual activity: No  Other Topics Concern  . Not on file  Social History Narrative  . Not on file  Spends most of  Her time in [place] with her daughter  Past Surgical Hx:  Past Surgical History:  Procedure Laterality Date  . EUS  N/A 01/07/2013   Procedure: UPPER ENDOSCOPIC ULTRASOUND (EUS) LINEAR;  Surgeon: Milus Banister, MD;  Location: [facility] ENDOSCOPY;  Service: Endoscopy;  Laterality: N/A;  . EUS N/A 01/05/2015   Procedure: UPPER ENDOSCOPIC ULTRASOUND (EUS) LINEAR;  Surgeon: Milus Banister, MD;  Location: [facility];  Service: Endoscopy;  Laterality: N/A;  . FOOT SURGERY Left 2009-2010   x2. Surgery in 2011  . LEFT HEART CATHETERIZATION WITH CORONARY ANGIOGRAM N/A 01/24/2014   Procedure: LEFT HEART CATHETERIZATION WITH CORONARY ANGIOGRAM;  Surgeon: Peter M Martinique, MD;  Location: [facility] C[facility] LAB;  Service: Cardiovascular;  Laterality: N/A;  . LUMBAR [place] SURGERY  01-01-13  . MASTECTOMY, RADICAL Left 1982  . OVARY SURGERY  1972-1973  . ROBOTIC ASSISTED TOTAL HYSTERECTOMY WITH BILATERAL SALPINGO OOPHERECTOMY N/A 10/22/2016   Procedure: XI ROBOTIC ASSISTED TOTAL HYSTERECTOMY WITH RIGHT SALPINGO OOPHORECTOMY; INJECTION OF GREEN DYE WITH EXCISION OF BILATERAL PELVIC LYMPH NODES;  Surgeon: Janie Morning, MD;  Location: [facility]S;  Service: Gynecology;  Laterality: N/A;  . ROTATOR CUFF REPAIR Left   . SENTINEL NODE BIOPSY N/A 10/22/2016   Procedure: SENTINEL NODE BIOPSY;  Surgeon: Janie Morning, MD;  Location: [facility];  Service: Gynecology;  Laterality: N/A;  . SIMPLE MASTECTOMY Left 1982  . TOTAL KNEE ARTHROPLASTY Bilateral 2009-2010   x2    Past Medical Hx:  Past Medical History:  Diagnosis Date  . Arthritis   . Breast cancer ([place])     LEFT, '  82-left breast cancer-surgery only  . Complication of anesthesia    nausea  . Diverticulosis   . Esophageal stricture   . Family history of stomach cancer   . GERD (gastroesophageal reflux disease)   . Hemorrhoids   . Hiatal hernia   . High triglycerides   . Hypercholesteremia   . Hypertension   . Hypothyroidism   . Osteoarthritis   . PONV (postoperative nausea and vomiting)     Past Gynecological History:  G2P2 No LMP recorded. Patient is postmenopausal.  Family Hx:   Family History  Problem Relation Age of Onset  . Heart disease Mother   . Other Mother        blood disease  . Heart attack Father   . Heart attack Brother   . Stomach cancer Maternal Aunt     Review of Systems:  Constitutional  Feels well,    ENT Normal appearing ears and nares bilaterally Skin/Breast  No rash, sores, jaundice, itching, dryness Cardiovascular  No chest pain, shortness of breath, or edema  Pulmonary  No cough or wheeze.  Gastro Intestinal  No nausea, vomitting, or diarrhoea. No bright red blood per rectum, no abdominal pain, change in bowel movement, or constipation.  Genito Urinary  No frequency, urgency, dysuria, no vaginal bleeding Musculo Skeletal  No myalgia, arthralgia, joint swelling or pain  Neurologic  No weakness, numbness, change in gait,  Psychology  No depression, anxiety, insomnia.   Vitals:  Blood pressure 140/77, pulse 64, temperature 98.4 F (36.9 C), temperature source Oral, resp. rate 18, weight 137 lb 9.6 oz (62.4 kg), SpO2 100 %.. Body mass index is 26.87 kg/m.   Physical Exam: WD in NAD Neck  Supple NROM, without any enlargements.  Lymph Node Survey No cervical supraclavicular or inguinal adenopathy Cardiovascular  Pulse normal rate, regularity and rhythm.  Lungs  Clear to auscultation bilateraly. Good air movement.  Skin  No rash/lesions/breakdown  Psychiatry  Alert and oriented to person, place, and time  Abdomen  Normoactive bowel sounds, abdomen soft, non-tender and overweight without evidence of hernia. Back No CVA tenderness Genito Urinary  Vulva/vagina: Normal external female genitalia.  No lesions. No discharge or bleeding. Vaginal cuff: Atrophic vagina. No masses Rectal  Good tone no masses Extremities  No bilateral cyanosis, clubbing or edema.    Janie Morning, MD  09/16/2017, 5:33 PM

## 2017-10-15 DIAGNOSIS — M85852 Other specified disorders of bone density and structure, left thigh: Secondary | ICD-10-CM | POA: Diagnosis not present

## 2017-10-15 DIAGNOSIS — N183 Chronic kidney disease, stage 3 (moderate): Secondary | ICD-10-CM | POA: Diagnosis not present

## 2017-10-16 DIAGNOSIS — N183 Chronic kidney disease, stage 3 (moderate): Secondary | ICD-10-CM | POA: Diagnosis not present

## 2017-10-16 DIAGNOSIS — E039 Hypothyroidism, unspecified: Secondary | ICD-10-CM | POA: Diagnosis not present

## 2017-10-21 DIAGNOSIS — Z6825 Body mass index (BMI) 25.0-25.9, adult: Secondary | ICD-10-CM | POA: Diagnosis not present

## 2017-10-21 DIAGNOSIS — R69 Illness, unspecified: Secondary | ICD-10-CM | POA: Diagnosis not present

## 2017-10-21 DIAGNOSIS — I10 Essential (primary) hypertension: Secondary | ICD-10-CM | POA: Diagnosis not present

## 2017-10-21 DIAGNOSIS — E039 Hypothyroidism, unspecified: Secondary | ICD-10-CM | POA: Diagnosis not present

## 2017-10-21 DIAGNOSIS — E785 Hyperlipidemia, unspecified: Secondary | ICD-10-CM | POA: Diagnosis not present

## 2017-10-21 DIAGNOSIS — Z0000 Encounter for general adult medical examination without abnormal findings: Secondary | ICD-10-CM | POA: Diagnosis not present

## 2017-10-21 DIAGNOSIS — N183 Chronic kidney disease, stage 3 (moderate): Secondary | ICD-10-CM | POA: Diagnosis not present

## 2017-10-21 DIAGNOSIS — R270 Ataxia, unspecified: Secondary | ICD-10-CM | POA: Diagnosis not present

## 2017-10-21 DIAGNOSIS — R739 Hyperglycemia, unspecified: Secondary | ICD-10-CM | POA: Diagnosis not present

## 2017-10-21 DIAGNOSIS — J309 Allergic rhinitis, unspecified: Secondary | ICD-10-CM | POA: Diagnosis not present

## 2017-11-25 ENCOUNTER — Inpatient Hospital Stay: Payer: Medicare HMO | Attending: Gynecologic Oncology

## 2017-11-25 DIAGNOSIS — E039 Hypothyroidism, unspecified: Secondary | ICD-10-CM | POA: Diagnosis not present

## 2017-11-25 DIAGNOSIS — Z79899 Other long term (current) drug therapy: Secondary | ICD-10-CM | POA: Diagnosis not present

## 2017-11-25 DIAGNOSIS — K219 Gastro-esophageal reflux disease without esophagitis: Secondary | ICD-10-CM | POA: Diagnosis not present

## 2017-11-25 DIAGNOSIS — I700 Atherosclerosis of aorta: Secondary | ICD-10-CM | POA: Insufficient documentation

## 2017-11-25 DIAGNOSIS — C541 Malignant neoplasm of endometrium: Secondary | ICD-10-CM | POA: Diagnosis not present

## 2017-11-25 DIAGNOSIS — Z90722 Acquired absence of ovaries, bilateral: Secondary | ICD-10-CM | POA: Diagnosis not present

## 2017-11-25 DIAGNOSIS — I2510 Atherosclerotic heart disease of native coronary artery without angina pectoris: Secondary | ICD-10-CM | POA: Diagnosis not present

## 2017-11-25 DIAGNOSIS — M1990 Unspecified osteoarthritis, unspecified site: Secondary | ICD-10-CM | POA: Diagnosis not present

## 2017-11-25 DIAGNOSIS — Z853 Personal history of malignant neoplasm of breast: Secondary | ICD-10-CM | POA: Insufficient documentation

## 2017-11-25 DIAGNOSIS — E7800 Pure hypercholesterolemia, unspecified: Secondary | ICD-10-CM | POA: Diagnosis not present

## 2017-11-25 DIAGNOSIS — Z79810 Long term (current) use of selective estrogen receptor modulators (SERMs): Secondary | ICD-10-CM

## 2017-11-25 DIAGNOSIS — Z90710 Acquired absence of both cervix and uterus: Secondary | ICD-10-CM | POA: Insufficient documentation

## 2017-11-25 DIAGNOSIS — R918 Other nonspecific abnormal finding of lung field: Secondary | ICD-10-CM | POA: Insufficient documentation

## 2017-11-25 LAB — COMPREHENSIVE METABOLIC PANEL
ALBUMIN: 4.1 g/dL (ref 3.5–5.0)
ALK PHOS: 83 U/L (ref 40–150)
ALT: 23 U/L (ref 0–55)
ANION GAP: 9 (ref 3–11)
AST: 25 U/L (ref 5–34)
BILIRUBIN TOTAL: 0.4 mg/dL (ref 0.2–1.2)
BUN: 22 mg/dL (ref 7–26)
CALCIUM: 10 mg/dL (ref 8.4–10.4)
CO2: 28 mmol/L (ref 22–29)
CREATININE: 1.27 mg/dL — AB (ref 0.60–1.10)
Chloride: 103 mmol/L (ref 98–109)
GFR calc Af Amer: 46 mL/min — ABNORMAL LOW (ref >60)
GFR calc non Af Amer: 40 mL/min — ABNORMAL LOW (ref >60)
GLUCOSE: 103 mg/dL (ref 70–140)
Potassium: 3.6 mmol/L (ref 3.5–5.1)
SODIUM: 140 mmol/L (ref 136–145)
TOTAL PROTEIN: 7.3 g/dL (ref 6.4–8.3)

## 2017-11-25 LAB — CBC WITH DIFFERENTIAL (CANCER CENTER ONLY)
BASOS ABS: 0.1 10*3/uL (ref 0.0–0.1)
BASOS PCT: 1 %
EOS PCT: 3 %
Eosinophils Absolute: 0.2 10*3/uL (ref 0.0–0.5)
HEMATOCRIT: 35.7 % (ref 34.8–46.6)
Hemoglobin: 12.3 g/dL (ref 11.6–15.9)
Lymphocytes Relative: 21 %
Lymphs Abs: 1.4 10*3/uL (ref 0.9–3.3)
MCH: 31.2 pg (ref 25.1–34.0)
MCHC: 34.5 g/dL (ref 31.5–36.0)
MCV: 90.6 fL (ref 79.5–101.0)
MONOS PCT: 8 %
Monocytes Absolute: 0.6 10*3/uL (ref 0.1–0.9)
NEUTROS ABS: 4.8 10*3/uL (ref 1.5–6.5)
Neutrophils Relative %: 67 %
Platelet Count: 235 10*3/uL (ref 145–400)
RBC: 3.94 MIL/uL (ref 3.70–5.45)
RDW: 12.6 % (ref 11.2–14.5)
WBC Count: 7 10*3/uL (ref 3.9–10.3)

## 2017-11-27 ENCOUNTER — Other Ambulatory Visit (HOSPITAL_COMMUNITY)
Admission: RE | Admit: 2017-11-27 | Discharge: 2017-11-27 | Disposition: A | Discharge status: Home / Self Care | Payer: Medicare HMO | Source: Ambulatory Visit | Attending: Gynecologic Oncology | Admitting: Gynecologic Oncology

## 2017-11-27 ENCOUNTER — Inpatient Hospital Stay (HOSPITAL_BASED_OUTPATIENT_CLINIC_OR_DEPARTMENT_OTHER): Payer: Medicare HMO | Admitting: Gynecologic Oncology

## 2017-11-27 ENCOUNTER — Encounter: Payer: Self-pay | Admitting: Gynecologic Oncology

## 2017-11-27 VITALS — BP 144/75 | HR 74 | Temp 98.5°F | Resp 20 | Ht 62.0 in | Wt 139.7 lb

## 2017-11-27 DIAGNOSIS — I2510 Atherosclerotic heart disease of native coronary artery without angina pectoris: Secondary | ICD-10-CM | POA: Diagnosis not present

## 2017-11-27 DIAGNOSIS — Z90710 Acquired absence of both cervix and uterus: Secondary | ICD-10-CM

## 2017-11-27 DIAGNOSIS — Z853 Personal history of malignant neoplasm of breast: Secondary | ICD-10-CM | POA: Diagnosis not present

## 2017-11-27 DIAGNOSIS — M1990 Unspecified osteoarthritis, unspecified site: Secondary | ICD-10-CM | POA: Diagnosis not present

## 2017-11-27 DIAGNOSIS — C541 Malignant neoplasm of endometrium: Secondary | ICD-10-CM

## 2017-11-27 DIAGNOSIS — Z90722 Acquired absence of ovaries, bilateral: Secondary | ICD-10-CM | POA: Diagnosis not present

## 2017-11-27 DIAGNOSIS — E039 Hypothyroidism, unspecified: Secondary | ICD-10-CM | POA: Diagnosis not present

## 2017-11-27 DIAGNOSIS — R918 Other nonspecific abnormal finding of lung field: Secondary | ICD-10-CM

## 2017-11-27 DIAGNOSIS — N888 Other specified noninflammatory disorders of cervix uteri: Secondary | ICD-10-CM | POA: Diagnosis not present

## 2017-11-27 DIAGNOSIS — I700 Atherosclerosis of aorta: Secondary | ICD-10-CM | POA: Diagnosis not present

## 2017-11-27 DIAGNOSIS — E7800 Pure hypercholesterolemia, unspecified: Secondary | ICD-10-CM | POA: Diagnosis not present

## 2017-11-27 NOTE — Patient Instructions (Signed)
1) Plan to have a CT scan of the chest, abdomen, and pelvis.  We will contact you with the results. 2) We are also ordered MSI testing on your original tumor removed back in 2018 to see if there may be a genetic abnormality that contributed to the development of your cancer.  A referral to genetics may be warranted if results are abnormal. 3) Plan to follow up in three months or sooner if needed.  Please call for any questions, concerns, or new symptoms.

## 2017-11-27 NOTE — Progress Notes (Signed)
GYN ONCOLOGY OFFICE VISIT   Referring MD: Dr. Stann Mainland  PCP Nada Libman  CC:  Chief Complaint  Patient presents with  . Endometrial cancer [facility])    Assessment/Plan:  Vicki Cervantes  is a 77 y.o.  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  Given the very low risk for recurrence (no myometrial invasion and disease restricted to a polyp) we are not recommending adjuvant therapy in accordance with NCCN guidelines.  Recommend 3-4 monthly surveillance exams for the next 2 years followed by 6 monthly exams in the subsequent 3 years.  Underwent BRCA testing without counseling by PCP.  Given h/o breast and serous endometrial cancer will check endometrial tumor for MSI status.  If MSI high and there is no M LH promoter methylation will refer for genetic counseling and follow their recommendations regarding additional testing eg for Lynch Syndrome.    Bilateral pulmonary nodules. Incidental finding of numerous bilateral pulmonary nodules <84m at time of imaging for new diagnosis of serous uterine cancer Repeat CT chest with and without contrast 02/2018   She will return to see Dr BSkeet Latchin 362month   HPI: Vicki Cervantes a [age].o.  old parous woman who is seen in consultation at the request of Dr WeStann Mainlandor high grade serous endometrial cancer.  The patient reports post menopausal spotting since December 2017. She reported this to Dr WeStann Mainlandho performed an [place] showed a normal sized uterus but with a thickened endometrial lining. Endometrial biopsy was taken on 08/29/16 which showed a high grade endometrial cancer, favor serous morphology.  She has a remote history of breast cancer at age [age]ut received BRCA testing approximately 5 years ago which was negative. Her cancer was treated with surgery but no radiation or chemotherapy.   She has a history of a laparotomy for endometriosis which involved a unilateral salpingo-oophorectomy and possible partial oophorectomy on  the contralateral side (>30 years ago).   Interval Hx: CT C/A/P 09/2016 IMPRESSION: 1. Thickening of the endometrial canal up to 19 mm in fundus, presumably corresponding to the patient's reported endometrial carcinoma. 2. Multiple tiny pulmonary nodules scattered throughout the lungs bilaterally measuring 4 mm or less in size. Nodules of this size are typically considered statistically likely benign. In the setting of known primary malignancy, metastatic disease to the lungs is not excluded, but is not strongly favored on today's examination. Attention on followup studies is recommended to ensure the stability or resolution of these nodules. 3. Subcentimeter low-attenuation lesion in the central aspect of segment 8 of the liver is too small to characterize. This is statistically likely a tiny cyst, but warrants attention on follow-up studies to exclude the possibility of a solitary hepatic metastasis. 4. 1.5 x 1.5 x 1.7 cm well-circumscribed lesion in the proximal stomach. This is of uncertain etiology and significance, and could represent a benign gastric polyp, however, further evaluation with nonemergent endoscopy is suggested in the near future for further evaluation. 5. **An incidental finding of potential clinical significance has been found. 1.1 x 1.6 cm thyroid nodule in the inferior aspect of the right lobe of the thyroid gland. Follow-up evaluation with nonemergent thyroid ultrasound is recommended in the near future to better evaluate this finding. This recommendation follows ACR consensuss guidelines: Managing Incidental Thyroid Nodules Detected on Imaging: White Paper of the ACR Incidental Thyroid Findings Committee. J Am Coll Radiol 2015;12(2):143-150.** 6. Aortic atherosclerosis, in addition to left anterior descending coronary artery disease. Assessment  for potential risk factor modification, dietary therapy or pharmacologic therapy may be warranted, if clinically indicated.   On  10/22/16 she underwent robotic assisted total hysterectomy, BSO and bilateral pelvic lymphadenectomy with Dr Skeet Latch.  Final pathology revealed a 3cm polyp containing serous carcinoma but with no myometrial invasion, no LVSI and negative nodes.  Vaginal nodule identified 02/2017 and removed Bx 02/2017 Vagina, biopsy, vaginal cuff - BENIGN SQUAMOUS AND GRANULATION TYPE TISSUE. - NO MALIGNANCY IDENTIFIED.  CT Chest/Abd/Pelvis to follow pulmonary nodule and gastric mass IMPRESSION: 1. Stable CT of the chest. Small pulmonary nodules are unchanged when compared with previous exam. 2. No new findings identified. 3. Subcentimeter low-attenuation lesions within the liver are remain too small to characterize but are stable from prior exam. 4. Persistent indeterminate low-attenuation structure within the proximal stomach is unchanged measuring 1.4 cm. Correlation with direct visualization is advised  Underwent BRCA testing.  No evidence of mutation.  Fells well and is without complaints.  Social Hx:   Social History   Socioeconomic History  . Marital status: Widowed    Spouse name: Not on file  . Number of children: 3  . Years of education: Not on file  . Highest education level: Not on file  Occupational History  . 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 Financial resource strain: Not on file  . Food insecurity:    Worry: Not on file    Inability: Not on file  . Transportation needs:    Medical: Not on file    Non-medical: Not on file  Tobacco Use  . Smoking status: Never Smoker  . Smokeless tobacco: Never Used  Substance and Sexual Activity  . Alcohol use: Yes    Alcohol/week: 0.0 oz    Comment: Wine Occas.  . Drug use: No  . Sexual activity: Never  Lifestyle  . Physical activity:    Days per week: Not on file    Minutes per session: Not on file  . Stress: Not on file  Relationships  . Social connections:    Talks on phone: Not  on file    Gets together: Not on file    Attends religious service: Not on file    Active member of club or organization: Not on file    Attends meetings of clubs or organizations: Not on file    Relationship status: Not on file  . Intimate partner violence:    Fear of current or ex partner: Not on file    Emotionally abused: Not on file    Physically abused: Not on file    Forced sexual activity: Not on file  Other Topics Concern  . Not on file  Social History Narrative  . Not on file  Spends most of  Her time in [place] with her daughter  Past Surgical Hx:  Past Surgical History:  Procedure Laterality Date  . EUS N/A 01/07/2013   Procedure: UPPER ENDOSCOPIC ULTRASOUND (EUS) LINEAR;  Surgeon: Milus Banister, MD;  Location: [facility];  Service: Endoscopy;  Laterality: N/A;  . EUS N/A 01/05/2015   Procedure: UPPER ENDOSCOPIC ULTRASOUND (EUS) LINEAR;  Surgeon: Milus Banister, MD;  Location: [facility];  Service: Endoscopy;  Laterality: N/A;  . FOOT SURGERY Left 2009-2010   x2. Surgery in 2011  . LEFT HEART CATHETERIZATION WITH CORONARY ANGIOGRAM N/A 01/24/2014   Procedure: LEFT HEART CATHETERIZATION WITH CORONARY ANGIOGRAM;  Surgeon: Peter M Martinique, MD;  Location: [facility] CATH  LAB;  Service: Cardiovascular;  Laterality: N/A;  . [place] SURGERY  01-01-13  . MASTECTOMY, RADICAL Left 1982  . OVARY SURGERY  1972-1973  . ROBOTIC ASSISTED TOTAL HYSTERECTOMY WITH BILATERAL SALPINGO OOPHERECTOMY N/A 10/22/2016   Procedure: XI ROBOTIC ASSISTED TOTAL HYSTERECTOMY WITH RIGHT SALPINGO OOPHORECTOMY; INJECTION OF GREEN DYE WITH EXCISION OF BILATERAL PELVIC LYMPH NODES;  Surgeon: Janie Morning, MD;  Location: [facility] ORS;  Service: Gynecology;  Laterality: N/A;  . ROTATOR CUFF REPAIR Left   . SENTINEL NODE BIOPSY N/A 10/22/2016   Procedure: SENTINEL NODE BIOPSY;  Surgeon: Janie Morning, MD;  Location: [facility] ORS;  Service: Gynecology;  Laterality: N/A;  . SIMPLE MASTECTOMY Left 1982  . TOTAL KNEE  ARTHROPLASTY Bilateral 2009-2010   x2    Past Medical Hx:  Past Medical History:  Diagnosis Date  . Arthritis   . Breast cancer ([place])     LEFT, '82-left breast cancer-surgery only  . Complication of anesthesia    nausea  . Diverticulosis   . Esophageal stricture   . Family history of stomach cancer   . GERD (gastroesophageal reflux disease)   . Hemorrhoids   . Hiatal hernia   . High triglycerides   . Hypercholesteremia   . Hypertension   . Hypothyroidism   . Osteoarthritis   . 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  Plans to spend the winter at her home in [place]  Past Gynecological History:  G2P2 No LMP recorded. Patient is postmenopausal.  Family Hx:  Family History  Problem Relation Age of Onset  . Heart disease Mother   . Other Mother        blood disease  . Heart attack Father   . Heart attack Brother   . Stomach cancer Maternal Aunt     Review of Systems:  Constitutional  Feels well,    ENT Normal appearing ears and nares bilaterally Skin/Breast  No rash, sores, jaundice, itching, dryness.   Cardiovascular  No chest pain, shortness of breath, or edema  Pulmonary  No cough or wheeze.  Gastro Intestinal  No nausea, vomitting, or diarrhoea. No bright red blood per rectum, no abdominal pain, change in bowel movement, or constipation.  Genito Urinary  No frequency, urgency, dysuria, no vaginal bleeding Musculo Skeletal  No myalgia, arthralgia, joint swelling or pain  Neurologic  No weakness, numbness, change in gait,  Psychology  No depression, anxiety, insomnia.   Vitals:  Blood pressure (!) 144/75, pulse 74, temperature 98.5 F (36.9 C), temperature source Oral, resp. rate 20, height _0  (1.575 m), weight 139 lb 11.2 oz (63.4 kg), SpO2 95 %.. Body mass index is 25.55 kg/m.   Physical Exam: -No change WD in NAD Neck  Supple NROM, without any enlargements.  Lymph Node  Survey No cervical supraclavicular or inguinal adenopathy Cardiovascular  Pulse normal rate, regularity and rhythm.  Lungs  Clear to auscultation bilateraly. Good air movement.  Skin  No rash/lesions/breakdown  Psychiatry  Alert and oriented to person, place, and time  Abdomen  Normoactive bowel sounds, abdomen soft, non-tender and overweight without evidence of hernia. Back No CVA tenderness Genito Urinary  Vulva/vagina: Normal external female genitalia.  No lesions. No discharge or bleeding. Vaginal cuff: Atrophic vagina. No masses Rectal  Good tone no masses Extremities  No bilateral cyanosis, clubbing or edema.    Janie Morning, MD  11/27/2017, 9:55 AM

## 2017-11-28 DIAGNOSIS — H02834 Dermatochalasis of left upper eyelid: Secondary | ICD-10-CM | POA: Diagnosis not present

## 2017-11-28 DIAGNOSIS — H02831 Dermatochalasis of right upper eyelid: Secondary | ICD-10-CM | POA: Diagnosis not present

## 2017-12-03 ENCOUNTER — Encounter (HOSPITAL_COMMUNITY): Payer: Self-pay

## 2017-12-03 ENCOUNTER — Other Ambulatory Visit: Payer: Self-pay | Admitting: Gynecologic Oncology

## 2017-12-03 ENCOUNTER — Encounter: Payer: Self-pay | Admitting: Gynecologic Oncology

## 2017-12-03 ENCOUNTER — Telehealth: Payer: Self-pay | Admitting: Internal Medicine

## 2017-12-03 ENCOUNTER — Ambulatory Visit (HOSPITAL_COMMUNITY)
Admission: RE | Admit: 2017-12-03 | Discharge: 2017-12-03 | Disposition: A | Discharge status: Home / Self Care | Payer: Medicare HMO | Source: Ambulatory Visit | Attending: Gynecologic Oncology | Admitting: Gynecologic Oncology

## 2017-12-03 DIAGNOSIS — I700 Atherosclerosis of aorta: Secondary | ICD-10-CM | POA: Diagnosis not present

## 2017-12-03 DIAGNOSIS — R918 Other nonspecific abnormal finding of lung field: Secondary | ICD-10-CM | POA: Diagnosis present

## 2017-12-03 DIAGNOSIS — K5730 Diverticulosis of large intestine without perforation or abscess without bleeding: Secondary | ICD-10-CM | POA: Insufficient documentation

## 2017-12-03 DIAGNOSIS — K319 Disease of stomach and duodenum, unspecified: Secondary | ICD-10-CM

## 2017-12-03 DIAGNOSIS — R911 Solitary pulmonary nodule: Secondary | ICD-10-CM | POA: Diagnosis not present

## 2017-12-03 DIAGNOSIS — C541 Malignant neoplasm of endometrium: Secondary | ICD-10-CM | POA: Diagnosis not present

## 2017-12-03 MED ORDER — IOPAMIDOL (ISOVUE-300) INJECTION 61%
100.0000 mL | Freq: Once | INTRAVENOUS | Status: AC | PRN
  Administered 2017-12-03: 100 mL via INTRAVENOUS

## 2017-12-03 MED ORDER — IOPAMIDOL (ISOVUE-300) INJECTION 61%
INTRAVENOUS | Status: AC
  Filled 2017-12-03: qty 100

## 2017-12-03 NOTE — Telephone Encounter (Signed)
Pt notified of appointment on 12-05-17

## 2017-12-03 NOTE — Progress Notes (Signed)
CT faxed to Dr. Reynaldo Minium at [phone]

## 2017-12-03 NOTE — Telephone Encounter (Signed)
Pt scheduled to see Ellouise Newer PA 12/05/17@2 :15pm, please notify pt of appt.

## 2017-12-03 NOTE — Progress Notes (Signed)
Discussed CT findings in detail with patient.  Given stomach lesion seen on CT, plan for referral to GI.  Copy of CT also mailed to patient with recommendation to follow up with Dr. Reynaldo Minium as well to see if any further evaluation would be needed in relation to the thyroid nodularity.  CT results also faxed to Dr. Reynaldo Minium.

## 2017-12-04 ENCOUNTER — Telehealth: Payer: Self-pay | Admitting: Gynecologic Oncology

## 2017-12-04 NOTE — Telephone Encounter (Signed)
Returned call to patient.  All questions answered.  She had a question about the lesion noted in the stomach.  No concerns voiced at end of call.  She is to see GI tomorrow.  Advised to call for any needs or concerns.

## 2017-12-05 ENCOUNTER — Encounter: Payer: Self-pay | Admitting: Physician Assistant

## 2017-12-05 ENCOUNTER — Ambulatory Visit: Payer: Medicare HMO | Admitting: Physician Assistant

## 2017-12-05 VITALS — BP 128/72 | HR 100 | Ht 60.75 in | Wt 141.1 lb

## 2017-12-05 DIAGNOSIS — Z86018 Personal history of other benign neoplasm: Secondary | ICD-10-CM

## 2017-12-05 DIAGNOSIS — Z1211 Encounter for screening for malignant neoplasm of colon: Secondary | ICD-10-CM | POA: Diagnosis not present

## 2017-12-05 DIAGNOSIS — Z1212 Encounter for screening for malignant neoplasm of rectum: Secondary | ICD-10-CM | POA: Diagnosis not present

## 2017-12-05 NOTE — Progress Notes (Signed)
Chief Complaint: Gastric tumor, colon cancer screening  HPI:    Vicki Cervantes is a 77 year old female with a past medical history as listed below, who follows with Dr. Henrene Pastor and who was referred to me by Burnard Bunting, MD for a complaint of abnormal imaging of the abdomen.      01/05/2015 upper EUS by Dr. Ardis Hughs for gastric submucosal lesion noted 2008, previous evaluation 2014, at that time the gastric submucosal lesion had not changed in size in the past 2 years and had not significantly changed in 8 years since first EUS and did not warrant surgical resection.    11/25/2017 CBC normal, CMP with a creatinine elevated at 1.27 (patient's baseline).    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    Today, patient presents to clinic and explains that this gastric lesion is not new to her, it has been looked at twice with EUS and has not changed in size to her knowledge, but she was diagnosed with endometrial cancer "an aggressive type" within the past year and this was fully treated.  Due to this her physician is suggesting that she have one further look at her gastric tumor as well as a repeat screening colonoscopy.      Overall the patient is in very good health.    Denies fever, chills, blood in her stool, weight loss, anorexia, nausea, vomiting, heartburn, reflux or abdominal pain.  Past Medical History:  Diagnosis Date  . Arthritis   . Breast cancer ([place])     LEFT, '82-left breast cancer-surgery only  . Complication of anesthesia    nausea  . Diverticulosis   . Esophageal stricture   . Family history of stomach cancer   . GERD (gastroesophageal reflux disease)   . Hemorrhoids   . Hiatal hernia   . High triglycerides   . Hypercholesteremia   . Hypertension   . Hypothyroidism   . Osteoarthritis   . PONV  (postoperative nausea and vomiting)     Past Surgical History:  Procedure Laterality Date  . EUS N/A 01/07/2013   Procedure: UPPER ENDOSCOPIC ULTRASOUND (EUS) LINEAR;  Surgeon: Milus Banister, MD;  [facility];  Service: Endoscopy;  Laterality: N/A;  . EUS N/A 01/05/2015   Procedure: UPPER ENDOSCOPIC ULTRASOUND (EUS) LINEAR;  Surgeon: Milus Banister, MD;  [facility];  Service: Endoscopy;  Laterality: N/A;  . FOOT SURGERY Left 2009-2010   x2. Surgery in 2011  . LEFT HEART CATHETERIZATION WITH CORONARY ANGIOGRAM N/A 01/24/2014   Procedure: LEFT HEART CATHETERIZATION WITH CORONARY ANGIOGRAM;  Surgeon: Peter M Martinique, MD;  Location: [facility] CATH LAB;  Service: Cardiovascular;  Laterality: N/A;  . LUMBAR [place] SURGERY  01-01-13  . MASTECTOMY, RADICAL Left 1982  . OVARY SURGERY  1972-1973  . ROBOTIC ASSISTED TOTAL HYSTERECTOMY WITH BILATERAL SALPINGO OOPHERECTOMY N/A 10/22/2016   Procedure: XI ROBOTIC ASSISTED TOTAL HYSTERECTOMY WITH RIGHT SALPINGO OOPHORECTOMY; INJECTION OF GREEN DYE WITH EXCISION OF BILATERAL PELVIC LYMPH NODES;  Surgeon: Janie Morning, MD;  [facility]: [facility];  Service: Gynecology;  Laterality: N/A;  . ROTATOR CUFF REPAIR Left   . SENTINEL NODE BIOPSY N/A 10/22/2016   Procedure: SENTINEL NODE BIOPSY;  Surgeon: Janie Morning, MD;  [facility]: [facility];  Service: Gynecology;  Laterality: N/A;  . SIMPLE MASTECTOMY Left 1982  . TOTAL KNEE ARTHROPLASTY Bilateral 2009-2010   x2  Current Outpatient Medications  Medication Sig Dispense Refill  . aspirin EC 81 MG tablet Take 81 mg by mouth daily.    Marland Kitchen atorvastatin (LIPITOR) 20 MG tablet Take 20 mg by mouth every evening.     . Calcium Carbonate-Vitamin D (CALCIUM-VITAMIN D) 500-200 MG-UNIT per tablet Take 1 tablet by mouth daily.    . celecoxib (CELEBREX) 200 MG capsule Take 200 mg by mouth at bedtime.     . citalopram (CELEXA) 20 MG tablet Take 20 mg by mouth at bedtime.     Marland Kitchen levothyroxine (SYNTHROID, LEVOTHROID)  100 MCG tablet Take 100 mcg by mouth daily before breakfast.     . losartan-hydrochlorothiazide (HYZAAR) 100-25 MG tablet     . Multiple Vitamin (MULTIVITAMIN WITH MINERALS) TABS Take 1 tablet by mouth daily.    . pantoprazole (PROTONIX) 40 MG tablet Take 40 mg by mouth every morning.     . raloxifene (EVISTA) 60 MG tablet Take 60 mg by mouth daily.      . valsartan-hydrochlorothiazide (DIOVAN-HCT) 320-25 MG per tablet Take 1 tablet by mouth at bedtime.      No current facility-administered medications for this visit.     Allergies as of 12/05/2017  . (No Known Allergies)    Family History  Problem Relation Age of Onset  . Heart disease Mother   . Other Mother        blood disease  . Heart attack Father   . Heart attack Brother   . Stomach cancer Maternal Aunt     Social History   Socioeconomic History  . Marital status: Widowed    Spouse name: Not on file  . Number of children: 3  . Years of education: Not on file  . Highest education level: Not on file  Occupational History  . 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 Financial resource strain: Not on file  . Food insecurity:    Worry: Not on file    Inability: Not on file  . Transportation needs:    Medical: Not on file    Non-medical: Not on file  Tobacco Use  . Smoking status: Never Smoker  . Smokeless tobacco: Never Used  Substance and Sexual Activity  . Alcohol use: Yes    Alcohol/week: 0.0 oz    Comment: Wine Occas.  . Drug use: No  . Sexual activity: Never  Lifestyle  . Physical activity:    Days per week: Not on file    Minutes per session: Not on file  . Stress: Not on file  Relationships  . Social connections:    Talks on phone: Not on file    Gets together: Not on file    Attends religious service: Not on file    Active member of club or organization: Not on file    Attends meetings of clubs or organizations: Not on file    Relationship status: Not on  file  . Intimate partner violence:    Fear of current or ex partner: Not on file    Emotionally abused: Not on file    Physically abused: Not on file    Forced sexual activity: Not on file  Other Topics Concern  . Not on file  Social History Narrative  . Not on file    Review of Systems:    Constitutional: No weight loss, fever or chills Skin: No rash  Cardiovascular: No chest pain Respiratory: No SOB  Gastrointestinal: See  HPI and otherwise negative Genitourinary: No dysuria  Neurological: No headache Musculoskeletal: No new muscle or joint pain Hematologic: No bleeding  Psychiatric: No history of depression or anxiety   Physical Exam:  Vital signs: BP 128/72 (BP Location: Left Arm, Patient Position: Sitting, Cuff Size: Normal)   Pulse 100   Ht 5' 0.75" (1.543 m)   Wt 141 lb 2 oz (64 kg)   BMI 26.89 kg/m    Constitutional:   Pleasant Caucasian female appears to be in NAD, Well developed, Well nourished, alert and cooperative Head:  Normocephalic and atraumatic. Eyes:   PEERL, EOMI. No icterus. Conjunctiva pink. Ears:  Normal auditory acuity. Neck:  Supple Throat: Oral cavity and pharynx without inflammation, swelling or lesion.  Respiratory: Respirations even and unlabored. Lungs clear to auscultation bilaterally.   No wheezes, crackles, or rhonchi.  Cardiovascular: Normal S1, S2. No MRG. Regular rate and rhythm. No peripheral edema, cyanosis or pallor.  Gastrointestinal:  Soft, nondistended, nontender. No rebound or guarding. Normal bowel sounds. No appreciable masses or hepatomegaly. Rectal:  Not performed.  Msk:  Symmetrical without gross deformities. Without edema, no deformity or joint abnormality.  Neurologic:  Alert and  oriented x4;  grossly normal neurologically.  Skin:   Dry and intact without significant lesions or rashes. Psychiatric: Demonstrates good judgement and reason without abnormal affect or behaviors.  MOST RECENT LABS AND IMAGING: CBC      Component Value Date/Time   WBC 7.0 11/25/2017 1300   WBC 10.7 (H) 10/23/2016 0457   RBC 3.94 11/25/2017 1300   HGB 12.3 11/25/2017 1300   HCT 35.7 11/25/2017 1300   PLT 235 11/25/2017 1300   MCV 90.6 11/25/2017 1300   MCH 31.2 11/25/2017 1300   MCHC 34.5 11/25/2017 1300   RDW 12.6 11/25/2017 1300   LYMPHSABS 1.4 11/25/2017 1300   MONOABS 0.6 11/25/2017 1300   EOSABS 0.2 11/25/2017 1300   BASOSABS 0.1 11/25/2017 1300    CMP     Component Value Date/Time   NA 140 11/25/2017 1300   NA 139 02/13/2017 1512   K 3.6 11/25/2017 1300   K 4.2 02/13/2017 1512   CL 103 11/25/2017 1300   CO2 28 11/25/2017 1300   CO2 28 02/13/2017 1512   GLUCOSE 103 11/25/2017 1300   GLUCOSE 118 02/13/2017 1512   BUN 22 11/25/2017 1300   BUN 25.2 02/13/2017 1512   CREATININE 1.27 (H) 11/25/2017 1300   CREATININE 1.2 (H) 02/13/2017 1512   CALCIUM 10.0 11/25/2017 1300   CALCIUM 10.0 02/13/2017 1512   PROT 7.3 11/25/2017 1300   ALBUMIN 4.1 11/25/2017 1300   AST 25 11/25/2017 1300   ALT 23 11/25/2017 1300   ALKPHOS 83 11/25/2017 1300   BILITOT 0.4 11/25/2017 1300   GFRNONAA 40 (L) 11/25/2017 1300   GFRAA 46 (L) 11/25/2017 1300    Assessment: 1.  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  Colorectal cancer screening: Last colonoscopy 2009, no polyps, repeat recommended in 10 years, patient is now over 50 but in very good health and requesting one last colonoscopy  Plan: 1.  Briefly discussed case with Dr. Ardis Hughs.  He will need to review patient's records to decipher whether or not she needs another EUS at this time.  He explained that if she does need an EUS he would  be happy to do her colonoscopy at the same time.  If she does not, colonoscopy will need to be scheduled Dr. Henrene Pastor in [facility]. 2.  Discussed above with patient.  Also took time  to discuss the fact that she is over 79 and screening colonoscopies are not usually recommended unless she is having symptoms, but patient just went through a lot with her endometrial cancer and would like to make sure that everything is okay as she is in very good health otherwise. 3.  We will contact patient after Dr. Ardis Hughs reviews chart to decipher next best steps.  Ellouise Newer, PA-C [facility] 12/05/2017, 2:13 PM  Cc: Burnard Bunting, MD

## 2017-12-05 NOTE — Patient Instructions (Addendum)
We will call you after Dr. Ardis Hughs has reviewed your chart with further instructions.

## 2017-12-05 NOTE — Progress Notes (Signed)
[place] and spoke with patient re: recommendations from Dr. Ardis Hughs. She is reassured. She would like to have colonoscopy though. Will have my nurse work on scheduling this with Dr. Henrene Pastor in the [facility].   Patty-please schedule for colo in [place] with Dr. Henrene Pastor. Patient is expecting call re: details next week.   Thanks, Ellouise Newer, PA-C

## 2017-12-05 NOTE — Progress Notes (Signed)
Small submucosal lesion without significant change since first noted in 2008 (comparing most recent CT to all previous EUS reports).  I don't think there is any need for another endoscopic evaluation of this lesion, would reassure her that it is not changing over 11 year interval and is not related to her endometrial cancer.  thanks

## 2017-12-09 NOTE — Progress Notes (Signed)
Above reviewed

## 2017-12-09 NOTE — Telephone Encounter (Signed)
Author: Levin Erp, PA Service: Gastroenterology Author Type: Physician Assistant  Filed: 12/05/2017 4:02 PM Encounter Date: 12/05/2017 Status: Signed  Editor: Levin Erp, PA (Physician Assistant)       Show:Clear all [x] Manual[] Template[] Copied  Added by: [x] Levin Erp, PA   [] Hover for details   [phone] Called and spoke with patient re: recommendations from Dr. Ardis Hughs. She is reassured. She would like to have colonoscopy though. Will have my nurse work on scheduling this with Dr. Henrene Pastor in the [facility].   Mariann Palo-please schedule for colo in [place] with Dr. Henrene Pastor. Patient is expecting call re: details next week.   Thanks, Ellouise Newer, PA-C

## 2017-12-10 ENCOUNTER — Telehealth: Payer: Self-pay | Admitting: Oncology

## 2017-12-10 DIAGNOSIS — L989 Disorder of the skin and subcutaneous tissue, unspecified: Secondary | ICD-10-CM | POA: Diagnosis not present

## 2017-12-10 DIAGNOSIS — I10 Essential (primary) hypertension: Secondary | ICD-10-CM | POA: Diagnosis not present

## 2017-12-10 DIAGNOSIS — K219 Gastro-esophageal reflux disease without esophagitis: Secondary | ICD-10-CM | POA: Diagnosis not present

## 2017-12-10 DIAGNOSIS — K649 Unspecified hemorrhoids: Secondary | ICD-10-CM | POA: Diagnosis not present

## 2017-12-10 DIAGNOSIS — M1990 Unspecified osteoarthritis, unspecified site: Secondary | ICD-10-CM | POA: Diagnosis not present

## 2017-12-10 DIAGNOSIS — M5126 Other intervertebral disc displacement, lumbar region: Secondary | ICD-10-CM | POA: Diagnosis not present

## 2017-12-10 DIAGNOSIS — E7849 Other hyperlipidemia: Secondary | ICD-10-CM | POA: Diagnosis not present

## 2017-12-10 DIAGNOSIS — R2689 Other abnormalities of gait and mobility: Secondary | ICD-10-CM | POA: Diagnosis not present

## 2017-12-10 DIAGNOSIS — C50919 Malignant neoplasm of unspecified site of unspecified female breast: Secondary | ICD-10-CM | POA: Diagnosis not present

## 2017-12-10 DIAGNOSIS — Z1389 Encounter for screening for other disorder: Secondary | ICD-10-CM | POA: Diagnosis not present

## 2017-12-10 NOTE — Telephone Encounter (Addendum)
Called Vicki Cervantes and notified her of MSI stable results on her pathology from 10/22/16.  She verbalized understanding.

## 2017-12-11 DIAGNOSIS — C44529 Squamous cell carcinoma of skin of other part of trunk: Secondary | ICD-10-CM | POA: Diagnosis not present

## 2017-12-11 DIAGNOSIS — Z85828 Personal history of other malignant neoplasm of skin: Secondary | ICD-10-CM | POA: Diagnosis not present

## 2017-12-11 DIAGNOSIS — D485 Neoplasm of uncertain behavior of skin: Secondary | ICD-10-CM | POA: Diagnosis not present

## 2018-01-04 DIAGNOSIS — R0982 Postnasal drip: Secondary | ICD-10-CM | POA: Diagnosis not present

## 2018-01-04 DIAGNOSIS — J069 Acute upper respiratory infection, unspecified: Secondary | ICD-10-CM | POA: Diagnosis not present

## 2018-01-20 DIAGNOSIS — R69 Illness, unspecified: Secondary | ICD-10-CM | POA: Diagnosis not present

## 2018-01-23 ENCOUNTER — Telehealth: Payer: Self-pay | Admitting: *Deleted

## 2018-01-23 NOTE — Telephone Encounter (Signed)
Called and left the patient a message to to call the office back. Need to move appt on 8/29 from 9:45am to 11:15am. Dr. Skeet Latch will be in the OR in the morning.

## 2018-01-26 DIAGNOSIS — Z85828 Personal history of other malignant neoplasm of skin: Secondary | ICD-10-CM | POA: Diagnosis not present

## 2018-01-26 DIAGNOSIS — C44529 Squamous cell carcinoma of skin of other part of trunk: Secondary | ICD-10-CM | POA: Diagnosis not present

## 2018-01-27 ENCOUNTER — Telehealth: Payer: Self-pay | Admitting: *Deleted

## 2018-01-27 NOTE — Telephone Encounter (Signed)
Called the patient, left a message to call the office back. Need to move her appt on 8/29 from 9:45am to 11:15am.

## 2018-01-28 ENCOUNTER — Telehealth: Payer: Self-pay | Admitting: *Deleted

## 2018-01-28 NOTE — Telephone Encounter (Signed)
Moved the patient's appt from 9:45am to 11:15am on 8/29

## 2018-02-02 DIAGNOSIS — Z6824 Body mass index (BMI) 24.0-24.9, adult: Secondary | ICD-10-CM | POA: Diagnosis not present

## 2018-02-02 DIAGNOSIS — S21109A Unspecified open wound of unspecified front wall of thorax without penetration into thoracic cavity, initial encounter: Secondary | ICD-10-CM | POA: Diagnosis not present

## 2018-02-02 DIAGNOSIS — Z4802 Encounter for removal of sutures: Secondary | ICD-10-CM | POA: Diagnosis not present

## 2018-02-10 DIAGNOSIS — Z85828 Personal history of other malignant neoplasm of skin: Secondary | ICD-10-CM | POA: Diagnosis not present

## 2018-02-10 DIAGNOSIS — D044 Carcinoma in situ of skin of scalp and neck: Secondary | ICD-10-CM | POA: Diagnosis not present

## 2018-02-10 DIAGNOSIS — D485 Neoplasm of uncertain behavior of skin: Secondary | ICD-10-CM | POA: Diagnosis not present

## 2018-02-10 DIAGNOSIS — L570 Actinic keratosis: Secondary | ICD-10-CM | POA: Diagnosis not present

## 2018-03-11 ENCOUNTER — Telehealth: Payer: Self-pay

## 2018-03-11 NOTE — Telephone Encounter (Signed)
Incoming call from pt to reschedule tomorrow's appt with Dr Skeet Latch, verbalized due to taking care of family member in [place].  Appt made for Sept 19th at 1:15 pm arrival- pt agreeable- no other needs per her at this time.

## 2018-03-12 ENCOUNTER — Inpatient Hospital Stay: Payer: Medicare HMO | Admitting: Gynecologic Oncology

## 2018-03-18 ENCOUNTER — Encounter: Payer: Self-pay | Admitting: Internal Medicine

## 2018-03-23 ENCOUNTER — Telehealth: Payer: Self-pay | Admitting: *Deleted

## 2018-03-23 NOTE — Telephone Encounter (Signed)
Open by mistake

## 2018-04-02 ENCOUNTER — Inpatient Hospital Stay: Payer: Medicare HMO | Attending: Gynecologic Oncology | Admitting: Gynecologic Oncology

## 2018-04-02 ENCOUNTER — Encounter: Payer: Self-pay | Admitting: Gynecologic Oncology

## 2018-04-02 DIAGNOSIS — Z90710 Acquired absence of both cervix and uterus: Secondary | ICD-10-CM | POA: Diagnosis not present

## 2018-04-02 DIAGNOSIS — C541 Malignant neoplasm of endometrium: Secondary | ICD-10-CM | POA: Diagnosis not present

## 2018-04-02 DIAGNOSIS — Z90722 Acquired absence of ovaries, bilateral: Secondary | ICD-10-CM

## 2018-04-02 NOTE — Progress Notes (Signed)
GYN ONCOLOGY OFFICE VISIT    PCP Nada Libman  CC:  Chief Complaint  Patient presents with  . Endometrial cancer [facility])    Assessment/Plan:  [name]  is a 77 y.o.  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  Given the very low risk for recurrence (no myometrial invasion and disease restricted to a polyp) we are not recommending adjuvant therapy in accordance with NCCN guidelines.  Recommend 3-4 monthly surveillance exams for the next 2 years followed by 6 monthly exams in the subsequent 3 years.  Underwent BRCA testing without counseling by PCP.  Given h/o breast and serous endometrial cancer will check endometrial tumor for MSI status.  If MSI high and there is no M LH promoter methylation will refer for genetic counseling and follow their recommendations regarding additional testing eg for Lynch Syndrome.    Bilateral pulmonary nodules. Incidental finding of numerous bilateral pulmonary nodules <59m at time of imaging for new diagnosis of serous uterine cancer Repeat CT chest with and without contrast 12//2019 before f/u visit  She will return to see Dr BSkeet Latchin 333month   HPI: Vicki Elizarrarazs a [age].o.  old parous woman who is seen in consultation at the request of Dr WeStann Mainlandor high grade serous endometrial cancer.  The patient reports post menopausal spotting since December 2017. She reported this to Dr WeStann Mainlandho performed an [place] showed a normal sized uterus but with a thickened endometrial lining. Endometrial biopsy was taken on 08/29/16 which showed a high grade endometrial cancer, favor serous morphology.  She has a remote history of breast cancer at age [age]ut received BRCA testing approximately 5 years ago which was negative. Her cancer was treated with surgery but no radiation or chemotherapy.   She has a history of a laparotomy for endometriosis which involved a unilateral salpingo-oophorectomy and possible partial oophorectomy on the  contralateral side (>30 years ago).   Interval Hx: CT C/A/P 09/2016 IMPRESSION: 1. Thickening of the endometrial canal up to 19 mm in fundus, presumably corresponding to the patient's reported endometrial carcinoma. 2. Multiple tiny pulmonary nodules scattered throughout the lungs bilaterally measuring 4 mm or less in size. Nodules of this size are typically considered statistically likely benign. In the setting of known primary malignancy, metastatic disease to the lungs is not excluded, but is not strongly favored on today's examination. Attention on followup studies is recommended to ensure the stability or resolution of these nodules. 3. Subcentimeter low-attenuation lesion in the central aspect of segment 8 of the liver is too small to characterize. This is statistically likely a tiny cyst, but warrants attention on follow-up studies to exclude the possibility of a solitary hepatic metastasis. 4. 1.5 x 1.5 x 1.7 cm well-circumscribed lesion in the proximal stomach. This is of uncertain etiology and significance, and could represent a benign gastric polyp, however, further evaluation with nonemergent endoscopy is suggested in the near future for further evaluation. 5. **An incidental finding of potential clinical significance has been found. 1.1 x 1.6 cm thyroid nodule in the inferior aspect of the right lobe of the thyroid gland. Follow-up evaluation with nonemergent thyroid ultrasound is recommended in the near future to better evaluate this finding. This recommendation follows ACR consensuss guidelines: Managing Incidental Thyroid Nodules Detected on Imaging: White Paper of the ACR Incidental Thyroid Findings Committee. J Am Coll Radiol 2015;12(2):143-150.** 6. Aortic atherosclerosis, in addition to left anterior descending coronary artery disease. Assessment for potential  risk factor modification, dietary therapy or pharmacologic therapy may be warranted, if clinically indicated.   On 10/22/16  she underwent robotic assisted total hysterectomy, BSO and bilateral pelvic lymphadenectomy with Dr Skeet Latch.  Final pathology revealed a 3cm polyp containing serous carcinoma but with no myometrial invasion, no LVSI and negative nodes.  Vaginal nodule identified 02/2017 and removed Bx 02/2017 Vagina, biopsy, vaginal cuff - BENIGN SQUAMOUS AND GRANULATION TYPE TISSUE. - NO MALIGNANCY IDENTIFIED.  CT Chest/Abd/Pelvis to follow pulmonary nodule and gastric mass IMPRESSION: 1. Stable CT of the chest. Small pulmonary nodules are unchanged when compared with previous exam. 2. No new findings identified. 3. Subcentimeter low-attenuation lesions within the liver are remain too small to characterize but are stable from prior exam. 4. Persistent indeterminate low-attenuation structure within the proximal stomach is unchanged measuring 1.4 cm. Correlation with direct visualization is advised  Underwent BRCA testing.  No evidence of mutation.  Fells well and is without complaints.  Social Hx:   Social History   Socioeconomic History  . Marital status: Widowed    Spouse name: Not on file  . Number of children: 3  . Years of education: Not on file  . Highest education level: Not on file  Occupational History  . 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 Financial resource strain: Not on file  . Food insecurity:    Worry: Not on file    Inability: Not on file  . Transportation needs:    Medical: Not on file    Non-medical: Not on file  Tobacco Use  . Smoking status: Never Smoker  . Smokeless tobacco: Never Used  Substance and Sexual Activity  . Alcohol use: Yes    Alcohol/week: 0.0 standard drinks    Comment: Wine Occas.  . Drug use: No  . Sexual activity: Never  Lifestyle  . Physical activity:    Days per week: Not on file    Minutes per session: Not on file  . Stress: Not on file  Relationships  . Social connections:    Talks on  phone: Not on file    Gets together: Not on file    Attends religious service: Not on file    Active member of club or organization: Not on file    Attends meetings of clubs or organizations: Not on file    Relationship status: Not on file  . Intimate partner violence:    Fear of current or ex partner: Not on file    Emotionally abused: Not on file    Physically abused: Not on file    Forced sexual activity: Not on file  Other Topics Concern  . Not on file  Social History Narrative  . Not on file  Spends most of  Her time in [place] with her daughter  Past Surgical Hx:  Past Surgical History:  Procedure Laterality Date  . EUS N/A 01/07/2013   Procedure: UPPER ENDOSCOPIC ULTRASOUND (EUS) LINEAR;  Surgeon: Milus Banister, MD;  Location: [facility] ENDOSCOPY;  Service: Endoscopy;  Laterality: N/A;  . EUS N/A 01/05/2015   Procedure: UPPER ENDOSCOPIC ULTRASOUND (EUS) LINEAR;  Surgeon: Milus Banister, MD;  Location: [facility] ENDOSCOPY;  Service: Endoscopy;  Laterality: N/A;  . FOOT SURGERY Left 2009-2010   x2. Surgery in 2011  . LEFT HEART CATHETERIZATION WITH CORONARY ANGIOGRAM N/A 01/24/2014   Procedure: LEFT HEART CATHETERIZATION WITH CORONARY ANGIOGRAM;  Surgeon: Peter M Martinique, MD;  Location: [facility] CATH LAB;  Service: Cardiovascular;  Laterality: N/A;  . LUMBAR [place] SURGERY  01-01-13  . MASTECTOMY, RADICAL Left 1982  . OVARY SURGERY  1972-1973  . ROBOTIC ASSISTED TOTAL HYSTERECTOMY WITH BILATERAL SALPINGO OOPHERECTOMY N/A 10/22/2016   Procedure: XI ROBOTIC ASSISTED TOTAL HYSTERECTOMY WITH RIGHT SALPINGO OOPHORECTOMY; INJECTION OF GREEN DYE WITH EXCISION OF BILATERAL PELVIC LYMPH NODES;  Surgeon: Janie Morning, MD;  Location: [facility] ORS;  Service: Gynecology;  Laterality: N/A;  . ROTATOR CUFF REPAIR Left   . SENTINEL NODE BIOPSY N/A 10/22/2016   Procedure: SENTINEL NODE BIOPSY;  Surgeon: Janie Morning, MD;  Location: [facility] ORS;  Service: Gynecology;  Laterality: N/A;  . SIMPLE MASTECTOMY Left 1982  .  TOTAL KNEE ARTHROPLASTY Bilateral 2009-2010   x2    Past Medical Hx:  Past Medical History:  Diagnosis Date  . Aortic atherosclerosis ([place])   . Arthritis   . Breast cancer ([place])     LEFT, '82-left breast cancer-surgery only  . Complication of anesthesia    nausea  . Diverticulosis   . Esophageal stricture   . Family history of stomach cancer   . GERD (gastroesophageal reflux disease)   . Hemorrhoids   . Hiatal hernia   . High triglycerides   . Hypercholesteremia   . Hypertension   . Hypothyroidism   . Osteoarthritis   . PONV (postoperative nausea and vomiting)   . Uterine cancer [facility])   Social Hx: Has a daughter who lives in [place] a son who lives in [place] and and another son who lives in [place].  Enjoys being in the lives of her children and grandchildren  Past Gynecological History:  G2P2 No LMP recorded. Patient is postmenopausal.  Family Hx:  Family History  Problem Relation Age of Onset  . Heart disease Mother   . Other Mother        blood disease  . Heart attack Father   . Heart attack Brother   . Stomach cancer Maternal Aunt     Review of Systems:  Constitutional  Feels well,    ENT Normal appearing ears and nares bilaterally Skin/Breast  No rash, sores, jaundice, itching, dryness.   Cardiovascular  No chest pain, shortness of breath, or edema  Pulmonary  No cough or wheeze.  Gastro Intestinal  No nausea, vomitting, or diarrhoea. No bright red blood per rectum, no abdominal pain, change in bowel movement, or constipation.  Genito Urinary  No frequency, urgency, dysuria, no vaginal bleeding Musculo Skeletal  No myalgia, arthralgia, joint swelling or pain  Neurologic  No weakness, numbness, change in gait,  Psychology  No depression, anxiety, insomnia.    Physical Exam: -  WD in NAD Neck  Supple NROM, without any enlargements.  Lymph Node Survey No cervical supraclavicular or inguinal adenopathy Cardiovascular  Pulse normal  rate, regularity and rhythm.  Lungs  Clear to auscultation bilateraly. Good air movement.  Skin  No rash/lesions/breakdown  Psychiatry  Alert and oriented to person, place, and time  Abdomen  Normoactive bowel sounds, abdomen soft, non-tender and overweight without evidence of hernia. Back No CVA tenderness Genito Urinary  Vulva/vagina: Normal external female genitalia.  No lesions. No discharge or bleeding. Vaginal cuff: Atrophic vagina. No masses Rectal  Good tone no masses Extremities  No bilateral cyanosis, clubbing or edema.    Janie Morning, MD  04/02/2018, 4:39 PM

## 2018-04-02 NOTE — Patient Instructions (Signed)
Follow up with Dr. Skeet Latch in 3 months as scheduled. Call the office if you have any questions or concerns prior to December visit. Call [phone].

## 2018-04-06 ENCOUNTER — Other Ambulatory Visit: Payer: Self-pay | Admitting: Gynecologic Oncology

## 2018-04-06 DIAGNOSIS — R911 Solitary pulmonary nodule: Secondary | ICD-10-CM

## 2018-04-07 ENCOUNTER — Telehealth: Payer: Self-pay | Admitting: *Deleted

## 2018-04-07 NOTE — Telephone Encounter (Signed)
Patient called back and was given the date/times for 12/17

## 2018-04-07 NOTE — Telephone Encounter (Signed)
Called and left the patient a message to call the office back. Patient needs to be given the date/time for her labs and scan on 12/17.

## 2018-04-21 DIAGNOSIS — M25512 Pain in left shoulder: Secondary | ICD-10-CM | POA: Diagnosis not present

## 2018-04-21 DIAGNOSIS — M19012 Primary osteoarthritis, left shoulder: Secondary | ICD-10-CM | POA: Diagnosis not present

## 2018-04-22 ENCOUNTER — Ambulatory Visit (AMBULATORY_SURGERY_CENTER): Payer: Self-pay | Admitting: *Deleted

## 2018-04-22 VITALS — Ht 62.5 in | Wt 138.2 lb

## 2018-04-22 DIAGNOSIS — Z1211 Encounter for screening for malignant neoplasm of colon: Secondary | ICD-10-CM

## 2018-04-22 MED ORDER — NA SULFATE-K SULFATE-MG SULF 17.5-3.13-1.6 GM/177ML PO SOLN: 1.0000 | Freq: Once | ORAL | 0 refills | Status: AC

## 2018-04-22 NOTE — Progress Notes (Signed)
No egg or soy allergy known to patient   issues with past sedation with any surgeries  or procedures of PONV , no intubation problems  No diet pills per patient No home 02 use per patient  No blood thinners per patient  Pt denies issues with constipation  No A fib or A flutter  EMMI video sent to pt's e mail - pt declined   

## 2018-04-29 ENCOUNTER — Encounter: Payer: Self-pay | Admitting: Internal Medicine

## 2018-05-06 ENCOUNTER — Encounter: Payer: Medicare HMO | Admitting: Internal Medicine

## 2018-05-06 DIAGNOSIS — Z23 Encounter for immunization: Secondary | ICD-10-CM | POA: Diagnosis not present

## 2018-05-13 ENCOUNTER — Ambulatory Visit (AMBULATORY_SURGERY_CENTER): Payer: Medicare HMO | Admitting: Internal Medicine

## 2018-05-13 ENCOUNTER — Encounter: Payer: Self-pay | Admitting: Internal Medicine

## 2018-05-13 VITALS — BP 154/72 | HR 67 | Temp 97.3°F | Resp 19 | Ht 62.0 in | Wt 141.0 lb

## 2018-05-13 DIAGNOSIS — Z1211 Encounter for screening for malignant neoplasm of colon: Secondary | ICD-10-CM | POA: Diagnosis not present

## 2018-05-13 DIAGNOSIS — D122 Benign neoplasm of ascending colon: Secondary | ICD-10-CM | POA: Diagnosis not present

## 2018-05-13 MED ORDER — SODIUM CHLORIDE 0.9 % IV SOLN: 500.0000 mL | Freq: Once | INTRAVENOUS | Status: DC

## 2018-05-13 NOTE — Progress Notes (Signed)
Called to room to assist during endoscopic procedure.  Patient ID and intended procedure confirmed with present staff. Received instructions for my participation in the procedure from the performing physician.  

## 2018-05-13 NOTE — Patient Instructions (Signed)
Handout given on polyps  YOU HAD AN ENDOSCOPIC PROCEDURE TODAY: Refer to the procedure report and other information in the discharge instructions given to you for any specific questions about what was found during the examination. If this information does not answer your questions, please call [place] office at [phone] to clarify.   YOU SHOULD EXPECT: Some feelings of bloating in the abdomen. Passage of more gas than usual. Walking can help get rid of the air that was put into your GI tract during the procedure and reduce the bloating. If you had a lower endoscopy (such as a colonoscopy or flexible sigmoidoscopy) you may notice spotting of blood in your stool or on the toilet paper. Some abdominal soreness may be present for a day or two, also.  DIET: Your first meal following the procedure should be a light meal and then it is ok to progress to your normal diet. A half-sandwich or bowl of soup is an example of a good first meal. Heavy or fried foods are harder to digest and may make you feel nauseous or bloated. Drink plenty of fluids but you should avoid alcoholic beverages for 24 hours. If you had a esophageal dilation, please see attached instructions for diet.    ACTIVITY: Your care partner should take you home directly after the procedure. You should plan to take it easy, moving slowly for the rest of the day. You can resume normal activity the day after the procedure however YOU SHOULD NOT DRIVE, use power tools, machinery or perform tasks that involve climbing or major physical exertion for 24 hours (because of the sedation medicines used during the test).   SYMPTOMS TO REPORT IMMEDIATELY: A gastroenterologist can be reached at any hour. Please call [phone]  for any of the following symptoms:  Following lower endoscopy (colonoscopy, flexible sigmoidoscopy) Excessive amounts of blood in the stool  Significant tenderness, worsening of abdominal pains  Swelling of the abdomen that is  new, acute  Fever of 100 or higher    FOLLOW UP:  If any biopsies were taken you will be contacted by phone or by letter within the next 1-3 weeks. Call [phone]  if you have not heard about the biopsies in 3 weeks.  Please also call with any specific questions about appointments or follow up tests.  

## 2018-05-13 NOTE — Progress Notes (Signed)
A and O x3. Report to RN. Tolerated MAC anesthesia well.

## 2018-05-13 NOTE — Progress Notes (Signed)
Pt's states no medical or surgical changes since previsit or office visit. 

## 2018-05-13 NOTE — Op Note (Signed)
[place] Patient Name: Vicki Cervantes Procedure Date: 05/13/2018 11:40 AM MRN: [id number] Endoscopist: Docia Chuck. Henrene Pastor , MD Age: 77 Referring MD:  Date of Birth: [birth year] Gender: Female Account #: [id number] Procedure:                Colonoscopy with cold snare polypectomy x 1 Indications:              Screening for colorectal malignant neoplasm. Last                            examination 2009 negative for neoplasia Medicines:                Monitored Anesthesia Care Procedure:                Pre-Anesthesia Assessment:                           - Prior to the procedure, a History and Physical                            was performed, and patient medications and                            allergies were reviewed. The patient's tolerance of                            previous anesthesia was also reviewed. The risks                            and benefits of the procedure and the sedation                            options and risks were discussed with the patient.                            All questions were answered, and informed consent                            was obtained. Prior Anticoagulants: The patient has                            taken no previous anticoagulant or antiplatelet                            agents. ASA Grade Assessment: II - A patient with                            mild systemic disease. After reviewing the risks                            and benefits, the patient was deemed in                            satisfactory condition to undergo the procedure.  After obtaining informed consent, the colonoscope                            was passed under direct vision. Throughout the                            procedure, the patient's blood pressure, pulse, and                            oxygen saturations were monitored continuously. The                            Colonoscope was introduced through the anus and            advanced to the the cecum, identified by                            appendiceal orifice and ileocecal valve. The                            ileocecal valve, appendiceal orifice, and rectum                            were photographed. The quality of the bowel                            preparation was good. The colonoscopy was performed                            without difficulty. The patient tolerated the                            procedure well. The bowel preparation used was                            SUPREP. Scope In: 11:53:19 AM Scope Out: 12:09:35 PM Scope Withdrawal Time: 0 hours 9 minutes 33 seconds  Total Procedure Duration: 0 hours 16 minutes 16 seconds  Findings:                 A 6 mm polyp was found in the ascending colon. The                            polyp was sessile. The polyp was removed with a                            cold snare. Resection and retrieval were complete.                           Multiple small and large-mouthed diverticula were                            found in the entire colon.  The exam was otherwise without abnormality on                            direct and retroflexion views. Complications:            No immediate complications. Estimated blood loss:                            None. Estimated Blood Loss:     Estimated blood loss: none. Impression:               - One 6 mm polyp in the ascending colon, removed                            with a cold snare. Resected and retrieved.                           - Diverticulosis in the entire examined colon.                           - The examination was otherwise normal on direct                            and retroflexion views. Recommendation:           - Repeat colonoscopy is not recommended for                            surveillance.                           - Patient has a contact number available for                            emergencies. The signs and  symptoms of potential                            delayed complications were discussed with the                            patient. Return to normal activities tomorrow.                            Written discharge instructions were provided to the                            patient.                           - Resume previous diet.                           - Continue present medications.                           - Await pathology results. Docia Chuck. Henrene Pastor, MD 05/13/2018 12:13:57 PM This report has been signed electronically.

## 2018-05-14 ENCOUNTER — Telehealth: Payer: Self-pay | Admitting: *Deleted

## 2018-05-14 NOTE — Telephone Encounter (Signed)
  Follow up Call-  Call back number 05/13/2018  Post procedure Call Back phone  # [phone]  Permission to leave phone message Yes  Some recent data might be hidden     Patient questions:  Do you have a fever, pain , or abdominal swelling? No. Pain Score  0 *  Have you tolerated food without any problems? Yes.    Have you been able to return to your normal activities? Yes.    Do you have any questions about your discharge instructions: Diet   No. Medications  No. Follow up visit  No.  Do you have questions or concerns about your Care? No.  Actions: * If pain score is 4 or above: No action needed, pain <4.

## 2018-05-15 ENCOUNTER — Encounter: Payer: Self-pay | Admitting: Internal Medicine

## 2018-05-26 DIAGNOSIS — D485 Neoplasm of uncertain behavior of skin: Secondary | ICD-10-CM | POA: Diagnosis not present

## 2018-05-26 DIAGNOSIS — R69 Illness, unspecified: Secondary | ICD-10-CM | POA: Diagnosis not present

## 2018-05-26 DIAGNOSIS — L821 Other seborrheic keratosis: Secondary | ICD-10-CM | POA: Diagnosis not present

## 2018-05-26 DIAGNOSIS — Z85828 Personal history of other malignant neoplasm of skin: Secondary | ICD-10-CM | POA: Diagnosis not present

## 2018-05-26 DIAGNOSIS — L859 Epidermal thickening, unspecified: Secondary | ICD-10-CM | POA: Diagnosis not present

## 2018-05-26 DIAGNOSIS — L570 Actinic keratosis: Secondary | ICD-10-CM | POA: Diagnosis not present

## 2018-06-05 DIAGNOSIS — M5126 Other intervertebral disc displacement, lumbar region: Secondary | ICD-10-CM | POA: Diagnosis not present

## 2018-06-05 DIAGNOSIS — M5136 Other intervertebral disc degeneration, lumbar region: Secondary | ICD-10-CM | POA: Diagnosis not present

## 2018-06-05 DIAGNOSIS — M4316 Spondylolisthesis, lumbar region: Secondary | ICD-10-CM | POA: Diagnosis not present

## 2018-06-05 DIAGNOSIS — M47816 Spondylosis without myelopathy or radiculopathy, lumbar region: Secondary | ICD-10-CM | POA: Diagnosis not present

## 2018-06-05 DIAGNOSIS — Z9889 Other specified postprocedural states: Secondary | ICD-10-CM | POA: Diagnosis not present

## 2018-06-08 DIAGNOSIS — I10 Essential (primary) hypertension: Secondary | ICD-10-CM | POA: Diagnosis not present

## 2018-06-08 DIAGNOSIS — E041 Nontoxic single thyroid nodule: Secondary | ICD-10-CM | POA: Diagnosis not present

## 2018-06-08 DIAGNOSIS — E7849 Other hyperlipidemia: Secondary | ICD-10-CM | POA: Diagnosis not present

## 2018-06-08 DIAGNOSIS — R82998 Other abnormal findings in urine: Secondary | ICD-10-CM | POA: Diagnosis not present

## 2018-06-15 DIAGNOSIS — E038 Other specified hypothyroidism: Secondary | ICD-10-CM | POA: Diagnosis not present

## 2018-06-15 DIAGNOSIS — Z0000 Encounter for general adult medical examination without abnormal findings: Secondary | ICD-10-CM | POA: Diagnosis not present

## 2018-06-15 DIAGNOSIS — E7849 Other hyperlipidemia: Secondary | ICD-10-CM | POA: Diagnosis not present

## 2018-06-15 DIAGNOSIS — Z23 Encounter for immunization: Secondary | ICD-10-CM | POA: Diagnosis not present

## 2018-06-15 DIAGNOSIS — I10 Essential (primary) hypertension: Secondary | ICD-10-CM | POA: Diagnosis not present

## 2018-06-15 DIAGNOSIS — R197 Diarrhea, unspecified: Secondary | ICD-10-CM | POA: Diagnosis not present

## 2018-06-16 DIAGNOSIS — H35372 Puckering of macula, left eye: Secondary | ICD-10-CM | POA: Diagnosis not present

## 2018-06-16 DIAGNOSIS — H33103 Unspecified retinoschisis, bilateral: Secondary | ICD-10-CM | POA: Diagnosis not present

## 2018-06-16 DIAGNOSIS — H43813 Vitreous degeneration, bilateral: Secondary | ICD-10-CM | POA: Diagnosis not present

## 2018-06-30 ENCOUNTER — Ambulatory Visit (HOSPITAL_COMMUNITY)
Admission: RE | Admit: 2018-06-30 | Discharge: 2018-06-30 | Disposition: A | Discharge status: Home / Self Care | Payer: Medicare HMO | Source: Ambulatory Visit | Attending: Gynecologic Oncology | Admitting: Gynecologic Oncology

## 2018-06-30 ENCOUNTER — Inpatient Hospital Stay: Payer: Medicare HMO | Attending: Gynecologic Oncology

## 2018-06-30 DIAGNOSIS — R911 Solitary pulmonary nodule: Secondary | ICD-10-CM | POA: Insufficient documentation

## 2018-06-30 DIAGNOSIS — R918 Other nonspecific abnormal finding of lung field: Secondary | ICD-10-CM | POA: Insufficient documentation

## 2018-06-30 DIAGNOSIS — C541 Malignant neoplasm of endometrium: Secondary | ICD-10-CM | POA: Insufficient documentation

## 2018-06-30 DIAGNOSIS — Z90710 Acquired absence of both cervix and uterus: Secondary | ICD-10-CM | POA: Diagnosis not present

## 2018-06-30 DIAGNOSIS — Z90722 Acquired absence of ovaries, bilateral: Secondary | ICD-10-CM | POA: Diagnosis not present

## 2018-06-30 LAB — BASIC METABOLIC PANEL
Anion gap: 9 (ref 5–15)
BUN: 24 mg/dL — ABNORMAL HIGH (ref 8–23)
CO2: 27 mmol/L (ref 22–32)
CREATININE: 1.3 mg/dL — AB (ref 0.44–1.00)
Calcium: 9.2 mg/dL (ref 8.9–10.3)
Chloride: 105 mmol/L (ref 98–111)
GFR calc non Af Amer: 39 mL/min — ABNORMAL LOW (ref >60)
GFR, EST AFRICAN AMERICAN: 46 mL/min — AB (ref >60)
Glucose, Bld: 113 mg/dL — ABNORMAL HIGH (ref 70–99)
Potassium: 3.9 mmol/L (ref 3.5–5.1)
SODIUM: 141 mmol/L (ref 135–145)

## 2018-06-30 MED ORDER — SODIUM CHLORIDE (PF) 0.9 % IJ SOLN
INTRAMUSCULAR | Status: AC
  Filled 2018-06-30: qty 50

## 2018-06-30 MED ORDER — IOHEXOL 300 MG/ML  SOLN
75.0000 mL | Freq: Once | INTRAMUSCULAR | Status: AC | PRN
  Administered 2018-06-30: 60 mL via INTRAVENOUS

## 2018-07-01 NOTE — Progress Notes (Signed)
GYN ONCOLOGY OFFICE VISIT  PCP Nada Libman  CC:  Serous endometrial cancer surveillance  Assessment/Plan:  [name]  is a 77 y.o.  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  Given the very low risk for recurrence (no myometrial invasion and disease restricted to a polyp) we are not recommending adjuvant therapy in accordance with NCCN guidelines.  Recommend every 6 monthly exams starting April 2020  Bilateral pulmonary nodules. Incidental finding of numerous bilateral pulmonary nodules <42m at time of imaging for new diagnosis of serous uterine cancer Repeat CT chest with 12//2019 shows stable nodules with no new lesions we will follow these lesions until June 2020  Follow-up with Dr BSkeet Latchin 354month   HPI: Vicki Rappleyes a [age].o.  old parous woman who is seen in consultation at the request of Dr WeStann Mainlandor high grade serous endometrial cancer.  The patient reports post menopausal spotting since December 2017. She reported this to Dr WeStann Mainlandho [place] an [place] showed a normal sized uterus but with a thickened endometrial lining. Endometrial biopsy was taken on 08/29/16 which showed a high grade endometrial cancer, favor serous morphology.  She has a remote history of breast cancer at age [age]ut received BRCA testing approximately 5 years ago which was negative. Her cancer was treated with surgery but no radiation or chemotherapy.   She has a history of a laparotomy for endometriosis which involved a unilateral salpingo-oophorectomy and possible partial oophorectomy on the contralateral side (>30 years ago).   Interval Hx: CT C/A/P 09/2016 IMPRESSION: 1. Thickening of the endometrial canal up to 19 mm in fundus, presumably corresponding to the patient's reported endometrial carcinoma. 2. Multiple tiny pulmonary nodules scattered throughout the lungs bilaterally measuring 4 mm or less in size. Nodules of this size are typically considered  statistically likely benign. In the setting of known primary malignancy, metastatic disease to the lungs is not excluded, but is not strongly favored on today's examination. Attention on followup studies is recommended to ensure the stability or resolution of these nodules. 3. Subcentimeter low-attenuation lesion in the central aspect of segment 8 of the liver is too small to characterize. This is statistically likely a tiny cyst, but warrants attention on follow-up studies to exclude the possibility of a solitary hepatic metastasis. 4. 1.5 x 1.5 x 1.7 cm well-circumscribed lesion in the proximal stomach. This is of uncertain etiology and significance, and could represent a benign gastric polyp, however, further evaluation with nonemergent endoscopy is suggested in the near future for further evaluation. 5. **An incidental finding of potential clinical significance has been found. 1.1 x 1.6 cm thyroid nodule in the inferior aspect of the right lobe of the thyroid gland. Follow-up evaluation with nonemergent thyroid ultrasound is recommended in the near future to better evaluate this finding. This recommendation follows ACR consensuss guidelines: Managing Incidental Thyroid Nodules Detected on Imaging: White Paper of the ACR Incidental Thyroid Findings Committee. J Am Coll Radiol 2015;12(2):143-150.** 6. Aortic atherosclerosis, in addition to left anterior descending coronary artery disease. Assessment for potential risk factor modification, dietary therapy or pharmacologic therapy may be warranted, if clinically indicated.   On 10/22/16 she underwent robotic assisted total hysterectomy, BSO and bilateral pelvic lymphadenectomy with Dr BrSkeet Latch Final pathology revealed a 3cm polyp containing serous carcinoma but with no myometrial invasion, no LVSI and negative nodes.  Vaginal nodule identified 02/2017 and removed Bx 02/2017 Vagina, biopsy, vaginal cuff - BENIGN SQUAMOUS AND GRANULATION  TYPE TISSUE. -  NO MALIGNANCY IDENTIFIED.  CT Chest/Abd/Pelvis to follow pulmonary nodule and gastric mass IMPRESSION: 1. Stable CT of the chest. Small pulmonary nodules are unchanged when compared with previous exam. 2. No new findings identified. 3. Subcentimeter low-attenuation lesions within the liver are remain too small to characterize but are stable from prior exam. 4. Persistent indeterminate low-attenuation structure within the proximal stomach is unchanged measuring 1.4 cm. Correlation with direct visualization is advised   CT 06/2018 Lungs/Pleura: Stable scattered sub-cm pulmonary nodules are again seen bilaterally and are stable compared to previous studies. No new or enlarging pulmonary nodules or masses identified. No evidence of pulmonary infiltrate or pleural effusion.  Underwent BRCA testing.  No evidence of mutation.  Fells well and is without complaints.  Social Hx:   Social History   Socioeconomic History  . Marital status: Widowed    Spouse name: Not on file  . Number of children: 3  . Years of education: Not on file  . Highest education level: Not on file  Occupational History  . 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 Financial resource strain: Not on file  . Food insecurity:    Worry: Not on file    Inability: Not on file  . Transportation needs:    Medical: Not on file    Non-medical: Not on file  Tobacco Use  . Smoking status: Never Smoker  . Smokeless tobacco: Never Used  Substance and Sexual Activity  . Alcohol use: Yes    Alcohol/week: 0.0 standard drinks    Comment: Wine Occas.  . Drug use: No  . Sexual activity: Never  Lifestyle  . Physical activity:    Days per week: Not on file    Minutes per session: Not on file  . Stress: Not on file  Relationships  . Social connections:    Talks on phone: Not on file    Gets together: Not on file    Attends religious service: Not on file    Active member of club  or organization: Not on file    Attends meetings of clubs or organizations: Not on file    Relationship status: Not on file  . Intimate partner violence:    Fear of current or ex partner: Not on file    Emotionally abused: Not on file    Physically abused: Not on file    Forced sexual activity: Not on file  Other Topics Concern  . Not on file  Social History Narrative  . Not on file  Spends most of  Her time in [place] with her daughter  Past Surgical Hx:  Past Surgical History:  Procedure Laterality Date  . COLONOSCOPY    . EUS N/A 01/07/2013   Procedure: UPPER ENDOSCOPIC ULTRASOUND (EUS) LINEAR;  Surgeon: Milus Banister, MD;  Location[facility];  Service: Endoscopy;  Laterality: N/A;  . EUS N/A 01/05/2015   Procedure: UPPER ENDOSCOPIC ULTRASOUND (EUS) LINEAR;  Surgeon: Milus Banister, MD;  Location: [facility];  Service: Endoscopy;  Laterality: N/A;  . FOOT SURGERY Left 2009-2010   x2. Surgery in 2011  . LEFT HEART CATHETERIZATION WITH CORONARY ANGIOGRAM N/A 01/24/2014   Procedure: LEFT HEART CATHETERIZATION WITH CORONARY ANGIOGRAM;  Surgeon: Peter M Martinique, MD;  Location: [facility] CATH LAB;  Service: Cardiovascular;  Laterality: N/A;  . LUMBAR [place] SURGERY  01-01-13  . MASTECTOMY, RADICAL Left 1982  . OVARY SURGERY  1972-1973  .  ROBOTIC ASSISTED TOTAL HYSTERECTOMY WITH BILATERAL SALPINGO OOPHERECTOMY N/A 10/22/2016   Procedure: XI ROBOTIC ASSISTED TOTAL HYSTERECTOMY WITH RIGHT SALPINGO OOPHORECTOMY; INJECTION OF GREEN DYE WITH EXCISION OF BILATERAL PELVIC LYMPH NODES;  Surgeon: Janie Morning, MD;  Location: [facility];  Service: Gynecology;  Laterality: N/A;  . ROTATOR CUFF REPAIR Left   . SENTINEL NODE BIOPSY N/A 10/22/2016   Procedure: SENTINEL NODE BIOPSY;  Surgeon: Janie Morning, MD;  Location: [facility];  Service: Gynecology;  Laterality: N/A;  . SIMPLE MASTECTOMY Left 1982  . TOTAL KNEE ARTHROPLASTY Bilateral 2009-2010   x2  . UPPER GASTROINTESTINAL ENDOSCOPY      Past  Medical Hx:  Past Medical History:  Diagnosis Date  . Aortic atherosclerosis ([place])   . Arthritis   . Blood transfusion without reported diagnosis   . Breast cancer ([place])     LEFT, '82-left breast cancer-surgery only  . Cataract    removed both eyes  . Complication of anesthesia    nausea  . Diverticulosis   . Esophageal stricture   . Family history of stomach cancer   . GERD (gastroesophageal reflux disease)   . Hemorrhoids   . Hiatal hernia   . High triglycerides   . Hypercholesteremia   . Hypertension   . Hypothyroidism   . Osteoarthritis   . PONV (postoperative nausea and vomiting)   . Uterine cancer [facility])   Social Hx: Has a daughter who lives in [place] a son who lives in [place] and and another son who lives in [place].  Enjoys being in the lives of her children and grandchildren  Past Gynecological History:  G2P2 No LMP recorded. Patient is postmenopausal.  Family Hx:  Family History  Problem Relation Age of Onset  . Heart disease Mother   . Other Mother        blood disease  . Heart attack Father   . Colon polyps Sister   . Heart attack Brother   . Colon polyps Brother   . Stomach cancer Maternal Aunt   . Colon cancer Neg Hx   . Esophageal cancer Neg Hx   . Rectal cancer Neg Hx   . Pancreatic cancer Neg Hx     Review of Systems:  Constitutional  Feels well,    ENT Normal appearing ears and nares bilaterally Skin/Breast  No rash, sores, jaundice, itching, dryness.   Cardiovascular  No chest pain, shortness of breath, or edema  Pulmonary  No cough or wheeze.  Gastro Intestinal  No nausea, vomitting, or diarrhoea. No bright red blood per rectum, no abdominal pain, change in bowel movement, or constipation.  Genito Urinary  No frequency, urgency, dysuria, no vaginal bleeding Musculo Skeletal  No myalgia, arthralgia, joint swelling or pain  Neurologic  No weakness, numbness, change in gait,  Psychology  No depression, anxiety, insomnia.     Physical Exam: - BP (!) 153/63 (BP Location: Right Arm, Patient Position: Sitting)   Pulse 71   Temp 98 F (36.7 C) (Oral)   Resp 18   Ht 5' 2.5" (1.588 m)   Wt 139 lb (63 kg)   SpO2 96%   BMI 25.02 kg/m  Wt Readings from Last 3 Encounters:  07/02/18 139 lb (63 kg)  05/13/18 141 lb (64 kg)  04/22/18 138 lb 3.2 oz (62.7 kg)   WD in NAD Neck  Supple NROM, without any enlargements.  Lymph Node Survey No cervical supraclavicular or inguinal adenopathy Cardiovascular  Pulse normal rate, regularity and rhythm.  Lungs  Clear to auscultation bilateraly. Good air movement.  Skin  No rash/lesions/breakdown  Psychiatry  Alert and oriented to person, place, and time  Abdomen  Normoactive bowel sounds, abdomen soft, non-tender without evidence of hernia. Back No CVA tenderness Genito Urinary  Vulva/vagina: Normal external female genitalia.  No lesions. No discharge or bleeding. Vaginal cuff: Atrophic vagina. No masses Rectal  Good tone no masses Extremities  No bilateral cyanosis, clubbing or edema.    Janie Morning, MD

## 2018-07-02 ENCOUNTER — Inpatient Hospital Stay: Payer: Medicare HMO | Admitting: Gynecologic Oncology

## 2018-07-02 ENCOUNTER — Encounter: Payer: Self-pay | Admitting: Gynecologic Oncology

## 2018-07-02 ENCOUNTER — Telehealth: Payer: Self-pay | Admitting: *Deleted

## 2018-07-02 VITALS — BP 153/63 | HR 71 | Temp 98.0°F | Resp 18 | Ht 62.5 in | Wt 139.0 lb

## 2018-07-02 DIAGNOSIS — C541 Malignant neoplasm of endometrium: Secondary | ICD-10-CM

## 2018-07-02 DIAGNOSIS — Z90710 Acquired absence of both cervix and uterus: Secondary | ICD-10-CM | POA: Diagnosis not present

## 2018-07-02 DIAGNOSIS — Z90722 Acquired absence of ovaries, bilateral: Secondary | ICD-10-CM

## 2018-07-02 DIAGNOSIS — R918 Other nonspecific abnormal finding of lung field: Secondary | ICD-10-CM

## 2018-07-02 NOTE — Telephone Encounter (Signed)
Called and moved the patient's appt to earlier today

## 2018-08-18 DIAGNOSIS — E876 Hypokalemia: Secondary | ICD-10-CM | POA: Diagnosis not present

## 2018-08-18 DIAGNOSIS — K219 Gastro-esophageal reflux disease without esophagitis: Secondary | ICD-10-CM | POA: Diagnosis not present

## 2018-08-18 DIAGNOSIS — I200 Unstable angina: Secondary | ICD-10-CM | POA: Diagnosis not present

## 2018-08-18 DIAGNOSIS — R079 Chest pain, unspecified: Secondary | ICD-10-CM | POA: Diagnosis not present

## 2018-08-18 DIAGNOSIS — R1013 Epigastric pain: Secondary | ICD-10-CM | POA: Diagnosis not present

## 2018-08-18 DIAGNOSIS — Z743 Need for continuous supervision: Secondary | ICD-10-CM | POA: Diagnosis not present

## 2018-08-18 DIAGNOSIS — I10 Essential (primary) hypertension: Secondary | ICD-10-CM | POA: Diagnosis not present

## 2018-08-18 DIAGNOSIS — R0789 Other chest pain: Secondary | ICD-10-CM | POA: Diagnosis not present

## 2018-08-18 DIAGNOSIS — R740 Nonspecific elevation of levels of transaminase and lactic acid dehydrogenase [LDH]: Secondary | ICD-10-CM | POA: Diagnosis not present

## 2018-08-18 DIAGNOSIS — N183 Chronic kidney disease, stage 3 (moderate): Secondary | ICD-10-CM | POA: Diagnosis not present

## 2018-08-18 DIAGNOSIS — E039 Hypothyroidism, unspecified: Secondary | ICD-10-CM | POA: Diagnosis not present

## 2018-08-18 DIAGNOSIS — R9431 Abnormal electrocardiogram [ECG] [EKG]: Secondary | ICD-10-CM | POA: Diagnosis not present

## 2018-08-18 DIAGNOSIS — I129 Hypertensive chronic kidney disease with stage 1 through stage 4 chronic kidney disease, or unspecified chronic kidney disease: Secondary | ICD-10-CM | POA: Diagnosis not present

## 2018-08-18 DIAGNOSIS — E785 Hyperlipidemia, unspecified: Secondary | ICD-10-CM | POA: Diagnosis not present

## 2018-08-19 DIAGNOSIS — I10 Essential (primary) hypertension: Secondary | ICD-10-CM | POA: Diagnosis not present

## 2018-08-19 DIAGNOSIS — R9431 Abnormal electrocardiogram [ECG] [EKG]: Secondary | ICD-10-CM | POA: Diagnosis not present

## 2018-08-19 DIAGNOSIS — K760 Fatty (change of) liver, not elsewhere classified: Secondary | ICD-10-CM | POA: Diagnosis not present

## 2018-08-19 DIAGNOSIS — R0789 Other chest pain: Secondary | ICD-10-CM | POA: Diagnosis not present

## 2018-08-19 DIAGNOSIS — R1013 Epigastric pain: Secondary | ICD-10-CM | POA: Diagnosis not present

## 2018-08-19 DIAGNOSIS — E876 Hypokalemia: Secondary | ICD-10-CM | POA: Diagnosis not present

## 2018-08-19 DIAGNOSIS — R079 Chest pain, unspecified: Secondary | ICD-10-CM | POA: Diagnosis not present

## 2018-08-19 DIAGNOSIS — E785 Hyperlipidemia, unspecified: Secondary | ICD-10-CM | POA: Diagnosis not present

## 2018-08-20 DIAGNOSIS — R0789 Other chest pain: Secondary | ICD-10-CM | POA: Diagnosis not present

## 2018-08-20 DIAGNOSIS — I10 Essential (primary) hypertension: Secondary | ICD-10-CM | POA: Diagnosis not present

## 2018-08-20 DIAGNOSIS — E876 Hypokalemia: Secondary | ICD-10-CM | POA: Diagnosis not present

## 2018-08-31 ENCOUNTER — Telehealth: Payer: Self-pay | Admitting: *Deleted

## 2018-08-31 NOTE — Telephone Encounter (Signed)
Called and moved the appt from 3/19 to 3/5

## 2018-09-01 ENCOUNTER — Telehealth: Payer: Self-pay | Admitting: *Deleted

## 2018-09-01 DIAGNOSIS — E876 Hypokalemia: Secondary | ICD-10-CM | POA: Diagnosis not present

## 2018-09-01 DIAGNOSIS — R739 Hyperglycemia, unspecified: Secondary | ICD-10-CM | POA: Diagnosis not present

## 2018-09-01 DIAGNOSIS — K219 Gastro-esophageal reflux disease without esophagitis: Secondary | ICD-10-CM | POA: Diagnosis not present

## 2018-09-01 DIAGNOSIS — E871 Hypo-osmolality and hyponatremia: Secondary | ICD-10-CM | POA: Diagnosis not present

## 2018-09-01 DIAGNOSIS — E039 Hypothyroidism, unspecified: Secondary | ICD-10-CM | POA: Diagnosis not present

## 2018-09-01 DIAGNOSIS — R079 Chest pain, unspecified: Secondary | ICD-10-CM | POA: Diagnosis not present

## 2018-09-01 DIAGNOSIS — E785 Hyperlipidemia, unspecified: Secondary | ICD-10-CM | POA: Diagnosis not present

## 2018-09-01 DIAGNOSIS — N183 Chronic kidney disease, stage 3 (moderate): Secondary | ICD-10-CM | POA: Diagnosis not present

## 2018-09-01 NOTE — Telephone Encounter (Signed)
Return the patient's call from last night. Left a message to call the office back.

## 2018-09-01 NOTE — Telephone Encounter (Signed)
Patient called back and confirmed her appt for 3/5

## 2018-09-09 DIAGNOSIS — K219 Gastro-esophageal reflux disease without esophagitis: Secondary | ICD-10-CM | POA: Diagnosis not present

## 2018-09-09 DIAGNOSIS — R0789 Other chest pain: Secondary | ICD-10-CM | POA: Diagnosis not present

## 2018-09-15 DIAGNOSIS — Z85828 Personal history of other malignant neoplasm of skin: Secondary | ICD-10-CM | POA: Diagnosis not present

## 2018-09-15 DIAGNOSIS — D485 Neoplasm of uncertain behavior of skin: Secondary | ICD-10-CM | POA: Diagnosis not present

## 2018-09-15 DIAGNOSIS — D044 Carcinoma in situ of skin of scalp and neck: Secondary | ICD-10-CM | POA: Diagnosis not present

## 2018-09-15 DIAGNOSIS — L570 Actinic keratosis: Secondary | ICD-10-CM | POA: Diagnosis not present

## 2018-09-17 ENCOUNTER — Encounter: Payer: Self-pay | Admitting: Gynecologic Oncology

## 2018-09-17 ENCOUNTER — Inpatient Hospital Stay: Payer: Medicare HMO | Attending: Gynecologic Oncology | Admitting: Gynecologic Oncology

## 2018-09-17 VITALS — BP 152/66 | HR 70 | Resp 18 | Ht 62.5 in | Wt 137.0 lb

## 2018-09-17 DIAGNOSIS — E039 Hypothyroidism, unspecified: Secondary | ICD-10-CM | POA: Insufficient documentation

## 2018-09-17 DIAGNOSIS — E7800 Pure hypercholesterolemia, unspecified: Secondary | ICD-10-CM | POA: Insufficient documentation

## 2018-09-17 DIAGNOSIS — M1990 Unspecified osteoarthritis, unspecified site: Secondary | ICD-10-CM | POA: Diagnosis not present

## 2018-09-17 DIAGNOSIS — I10 Essential (primary) hypertension: Secondary | ICD-10-CM | POA: Diagnosis not present

## 2018-09-17 DIAGNOSIS — Z90722 Acquired absence of ovaries, bilateral: Secondary | ICD-10-CM | POA: Insufficient documentation

## 2018-09-17 DIAGNOSIS — K219 Gastro-esophageal reflux disease without esophagitis: Secondary | ICD-10-CM | POA: Diagnosis not present

## 2018-09-17 DIAGNOSIS — I700 Atherosclerosis of aorta: Secondary | ICD-10-CM | POA: Insufficient documentation

## 2018-09-17 DIAGNOSIS — R918 Other nonspecific abnormal finding of lung field: Secondary | ICD-10-CM

## 2018-09-17 DIAGNOSIS — C541 Malignant neoplasm of endometrium: Secondary | ICD-10-CM

## 2018-09-17 DIAGNOSIS — R9389 Abnormal findings on diagnostic imaging of other specified body structures: Secondary | ICD-10-CM | POA: Insufficient documentation

## 2018-09-17 DIAGNOSIS — R69 Illness, unspecified: Secondary | ICD-10-CM | POA: Diagnosis not present

## 2018-09-17 DIAGNOSIS — Z853 Personal history of malignant neoplasm of breast: Secondary | ICD-10-CM | POA: Insufficient documentation

## 2018-09-17 DIAGNOSIS — Z90710 Acquired absence of both cervix and uterus: Secondary | ICD-10-CM | POA: Diagnosis not present

## 2018-09-17 DIAGNOSIS — C52 Malignant neoplasm of vagina: Secondary | ICD-10-CM | POA: Diagnosis not present

## 2018-09-17 NOTE — Progress Notes (Signed)
GYN ONCOLOGY OFFICE VISIT  PCP Vicki Cervantes  CC:  Serous endometrial cancer surveillance  Assessment/Plan:  [name]  is a 78 y.o.  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  Given the very low risk for recurrence (no myometrial invasion and disease restricted to a polyp) we are not recommending adjuvant therapy in accordance with NCCN guidelines. Vaginal lesion removed.  If  The path is c/w recurrence will schedule PET and will see in clinic 10/01/18 to dicuss treatment plan  Otherwise if this is granulation tissue f/u in 2 month to evaluate that all has been removed.    Recommend every 6 monthly exams starting April 2020  Bilateral pulmonary nodules. Incidental finding of numerous bilateral pulmonary nodules <90m at time of imaging for new diagnosis of serous uterine cancer Repeat CT chest with 12//2019 shows stable nodules with no new lesions we will follow these lesions until June 2020   HPI: Vicki Armasis a 78y.o.  old parous woman who is seen in consultation at the request of Vicki Vicki Mainlandfor high grade serous endometrial cancer.  The patient reports post menopausal spotting since December 2017. She reported this to Vicki Vicki Mainlandwho performed an [place] showed a normal sized uterus but with a thickened endometrial lining. Endometrial biopsy was taken on 08/29/16 which showed a high grade endometrial cancer, favor serous morphology.  She has a remote history of breast cancer at age [age]but received BRCA testing approximately 5 years ago which was negative. Her cancer was treated with surgery but no radiation or chemotherapy.   She has a history of a laparotomy for endometriosis which involved a unilateral salpingo-oophorectomy and possible partial oophorectomy on the contralateral side (>30 years ago).   Interval Hx: CT C/A/P 09/2016 IMPRESSION: 1. Thickening of the endometrial canal up to 19 mm in fundus, presumably corresponding to the patient's  reported endometrial carcinoma. 2. Multiple tiny pulmonary nodules scattered throughout the lungs bilaterally measuring 4 mm or less in size. Nodules of this size are typically considered statistically likely benign. In the setting of known primary malignancy, metastatic disease to the lungs is not excluded, but is not strongly favored on today's examination. Attention on followup studies is recommended to ensure the stability or resolution of these nodules. 3. Subcentimeter low-attenuation lesion in the central aspect of segment 8 of the liver is too small to characterize. This is statistically likely a tiny cyst, but warrants attention on follow-up studies to exclude the possibility of a solitary hepatic metastasis. 4. 1.5 x 1.5 x 1.7 cm well-circumscribed lesion in the proximal stomach. This is of uncertain etiology and significance, and could represent a benign gastric polyp, however, further evaluation with nonemergent endoscopy is suggested in the near future for further evaluation. 5. **An incidental finding of potential clinical significance has been found. 1.1 x 1.6 cm thyroid nodule in the inferior aspect of the right lobe of the thyroid gland. Follow-up evaluation with nonemergent thyroid ultrasound is recommended in the near future to better evaluate this finding. This recommendation follows ACR consensuss guidelines: Managing Incidental Thyroid Nodules Detected on Imaging: White Paper of the ACR Incidental Thyroid Findings Committee. J Am Coll Radiol 2015;12(2):143-150.** 6. Aortic atherosclerosis, in addition to left anterior descending coronary artery disease. Assessment for potential risk factor modification, dietary therapy or pharmacologic therapy may be warranted, if clinically indicated.   On 10/22/16 she underwent robotic assisted total hysterectomy, BSO and bilateral pelvic lymphadenectomy with Vicki Vicki Cervantes  Final pathology revealed a 3cm polyp containing serous carcinoma but with no  myometrial invasion, no LVSI and negative nodes.  Vaginal nodule identified 02/2017 and removed Bx 02/2017 Vagina, biopsy, vaginal cuff - BENIGN SQUAMOUS AND GRANULATION TYPE TISSUE. - NO MALIGNANCY IDENTIFIED.  CT Chest/Abd/Pelvis to follow pulmonary nodule and gastric mass IMPRESSION: 1. Stable CT of the chest. Small pulmonary nodules are unchanged when compared with previous exam. 2. No new findings identified. 3. Subcentimeter low-attenuation lesions within the liver are remain too small to characterize but are stable from prior exam. 4. Persistent indeterminate low-attenuation structure within the proximal stomach is unchanged measuring 1.4 cm. Correlation with direct visualization is advised   CT 06/2018 Lungs/Pleura: Stable scattered sub-cm pulmonary nodules are again seen bilaterally and are stable compared to previous studies. No new or enlarging pulmonary nodules or masses identified. No evidence of pulmonary infiltrate or pleural effusion.  Underwent BRCA testing.  No evidence of mutation.    Reports vaginal discharge ? Urinary incontinence for a few weeks.   Social Hx:   Social History   Socioeconomic History  . Marital status: Widowed    Spouse name: Not on file  . Number of children: 3  . Years of education: Not on file  . Highest education level: Not on file  Occupational History  . 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 Financial resource strain: Not on file  . Food insecurity:    Worry: Not on file    Inability: Not on file  . Transportation needs:    Medical: Not on file    Non-medical: Not on file  Tobacco Use  . Smoking status: Never Smoker  . Smokeless tobacco: Never Used  Substance and Sexual Activity  . Alcohol use: Yes    Alcohol/week: 0.0 standard drinks    Comment: Wine Occas.  . Drug use: No  . Sexual activity: Never  Lifestyle  . Physical activity:    Days per week: Not on file     Minutes per session: Not on file  . Stress: Not on file  Relationships  . Social connections:    Talks on phone: Not on file    Gets together: Not on file    Attends religious service: Not on file    Active member of club or organization: Not on file    Attends meetings of clubs or organizations: Not on file    Relationship status: Not on file  . Intimate partner violence:    Fear of current or ex partner: Not on file    Emotionally abused: Not on file    Physically abused: Not on file    Forced sexual activity: Not on file  Other Topics Concern  . Not on file  Social History Narrative  . Not on file  Spends most of  Her time in [place] with her daughter  Past Surgical Hx:  Past Surgical History:  Procedure Laterality Date  . COLONOSCOPY    . EUS N/A 01/07/2013   Procedure: UPPER ENDOSCOPIC ULTRASOUND (EUS) LINEAR;  Surgeon: Milus Banister, MD;  [facility];  Service: Endoscopy;  Laterality: N/A;  . EUS N/A 01/05/2015   Procedure: UPPER ENDOSCOPIC ULTRASOUND (EUS) LINEAR;  Surgeon: Milus Banister, MD;  [facility];  Service: Endoscopy;  Laterality: N/A;  . FOOT SURGERY Left 2009-2010   x2. Surgery in 2011  . LEFT HEART CATHETERIZATION WITH CORONARY ANGIOGRAM N/A 01/24/2014   Procedure:  LEFT HEART CATHETERIZATION WITH CORONARY ANGIOGRAM;  Surgeon: Peter M Martinique, MD;  Location[facility] CATH LAB;  Service: Cardiovascular;  Laterality: N/A;  . LUMBAR [place] SURGERY  01-01-13  . MASTECTOMY, RADICAL Left 1982  . OVARY SURGERY  1972-1973  . ROBOTIC ASSISTED TOTAL HYSTERECTOMY WITH BILATERAL SALPINGO OOPHERECTOMY N/A 10/22/2016   Procedure: XI ROBOTIC ASSISTED TOTAL HYSTERECTOMY WITH RIGHT SALPINGO OOPHORECTOMY; INJECTION OF GREEN DYE WITH EXCISION OF BILATERAL PELVIC LYMPH NODES;  Surgeon: Janie Morning, MD;  [facility];  Service: Gynecology;  Laterality: N/A;  . ROTATOR CUFF REPAIR Left   . SENTINEL NODE BIOPSY N/A 10/22/2016   Procedure: SENTINEL NODE  BIOPSY;  Surgeon: Janie Morning, MD;  [facility]: [facility];  Service: Gynecology;  Laterality: N/A;  . SIMPLE MASTECTOMY Left 1982  . TOTAL KNEE ARTHROPLASTY Bilateral 2009-2010   x2  . UPPER GASTROINTESTINAL ENDOSCOPY      Past Medical Hx:  Past Medical History:  Diagnosis Date  . Aortic atherosclerosis ([place])   . Arthritis   . Blood transfusion without reported diagnosis   . Breast cancer ([place])     LEFT, '82-left breast cancer-surgery only  . Cataract    removed both eyes  . Complication of anesthesia    nausea  . Diverticulosis   . Esophageal stricture   . Family history of stomach cancer   . GERD (gastroesophageal reflux disease)   . Hemorrhoids   . Hiatal hernia   . High triglycerides   . Hypercholesteremia   . Hypertension   . Hypothyroidism   . Osteoarthritis   . PONV (postoperative nausea and vomiting)   . Uterine cancer [facility])   Social Hx: Has a daughter who lives in [place] a son who lives in [place] and and another son who lives in [place].  Enjoys being in the lives of her children and grandchildren  Past Gynecological History:  G2P2 No LMP recorded. Patient is postmenopausal.  Family Hx:  Family History  Problem Relation Age of Onset  . Heart disease Mother   . Other Mother        blood disease  . Heart attack Father   . Colon polyps Sister   . Heart attack Brother   . Colon polyps Brother   . Stomach cancer Maternal Aunt   . Colon cancer Neg Hx   . Esophageal cancer Neg Hx   . Rectal cancer Neg Hx   . Pancreatic cancer Neg Hx     Review of Systems:  Constitutional  Feels well,    ENT Normal appearing ears and nares bilaterally Skin/Breast  No rash, sores, jaundice, itching, dryness.   Cardiovascular  No chest pain, shortness of breath, or edema  Pulmonary  No cough or wheeze.  Gastro Intestinal  No nausea, vomitting, or diarrhoea. No bright red blood per rectum, no abdominal pain, change in bowel movement, or constipation.   Genito Urinary  No frequency, urgency, dysuria, no vaginal bleeding Musculo Skeletal  No myalgia, arthralgia, joint swelling or pain  Neurologic  No weakness, numbness, change in gait,  Psychology  No depression, anxiety, insomnia.    Physical Exam: - BP (!) 152/66 (BP Location: Right Arm, Patient Position: Sitting)   Pulse 70   Resp 18   Ht 5' 2.5" (1.588 m)   Wt 137 lb (62.1 kg)   SpO2 97% Comment: room air   BMI 24.66 kg/m  Wt Readings from Last 3 Encounters:  09/17/18 137 lb (62.1 kg)  07/02/18 139 lb (63 kg)  05/13/18 141 lb (64 kg)   WD in NAD Neck  Supple NROM, without any enlargements.  Lymph Node Survey No cervical supraclavicular or inguinal adenopathy HEENT:  Four areas of erosion from removal of cancers of the scalp..  Lungs  Clear to auscultation bilateraly. Good air movement.  Skin  No rash/lesions/breakdown  Psychiatry  Alert and oriented to person, place, and time  Abdomen  Normoactive bowel sounds, abdomen soft, non-tender without evidence of hernia. Back No CVA tenderness Genito Urinary  Vulva/vagina: Normal external female genitalia.  No lesions. No discharge or bleeding. Vaginal cuff: Atrophic vagina. 64m lesion at the left vaginal apex. No cul de sac masses Rectal  Good tone no masses Extremities  No bilateral cyanosis, clubbing or edema.   VAGINAL BIOPSY  Procedure explained to patient.  The vaginal apex area area was prepped with betadine.  .  A biopsy was taken from the vaginal apex, the lesion was removed.  There was minimal bleeding that was  controlled with silver nitrate and pressure for 2 minutes.  . Patient tolerated the procedure well.     WJanie Morning MD

## 2018-09-17 NOTE — Patient Instructions (Signed)
We will call you with the results of the biopsy when available. Follow up with Dr. Skeet Latch on 11-05-18 as scheduled.

## 2018-09-21 ENCOUNTER — Telehealth: Payer: Self-pay | Admitting: *Deleted

## 2018-09-21 ENCOUNTER — Telehealth: Payer: Self-pay | Admitting: Gynecologic Oncology

## 2018-09-21 ENCOUNTER — Other Ambulatory Visit: Payer: Self-pay | Admitting: Gynecologic Oncology

## 2018-09-21 DIAGNOSIS — C541 Malignant neoplasm of endometrium: Secondary | ICD-10-CM

## 2018-09-21 NOTE — Telephone Encounter (Signed)
Attempted to call patient with biopsy results.  Left messages on cell phone and home number asking her to please call the office.

## 2018-09-21 NOTE — Telephone Encounter (Signed)
Patient returned call to the office. Informed of biopsy results along with Dr. Leone Brand recommendations for PET scan then follow up in the office on March 19 to discuss results and next steps.  All questions answered. No concerns voiced. Advised she would be contacted with the date and time for her PET scan.

## 2018-09-21 NOTE — Progress Notes (Signed)
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  PET scan per Dr. Skeet Latch to evaluate for metastatic disease.

## 2018-09-21 NOTE — Telephone Encounter (Signed)
Called and spoke with the patient. Gave her the appt date/time of the PET scan on 3/16 at 10am, arrive at 9:30am; nothing to eat 6 hrs before, water only. Also gave her the follow up appt with Dr. Skeet Latch on 3/19 at 12:45pm

## 2018-09-23 DIAGNOSIS — K3189 Other diseases of stomach and duodenum: Secondary | ICD-10-CM | POA: Diagnosis not present

## 2018-09-23 DIAGNOSIS — K219 Gastro-esophageal reflux disease without esophagitis: Secondary | ICD-10-CM | POA: Diagnosis not present

## 2018-09-23 DIAGNOSIS — K2970 Gastritis, unspecified, without bleeding: Secondary | ICD-10-CM | POA: Diagnosis not present

## 2018-09-23 DIAGNOSIS — K2930 Chronic superficial gastritis without bleeding: Secondary | ICD-10-CM | POA: Diagnosis not present

## 2018-09-23 DIAGNOSIS — R0789 Other chest pain: Secondary | ICD-10-CM | POA: Diagnosis not present

## 2018-09-28 ENCOUNTER — Ambulatory Visit (HOSPITAL_COMMUNITY)
Admission: RE | Admit: 2018-09-28 | Discharge: 2018-09-28 | Disposition: A | Discharge status: Home / Self Care | Payer: Medicare HMO | Source: Ambulatory Visit | Attending: Gynecologic Oncology | Admitting: Gynecologic Oncology

## 2018-09-28 ENCOUNTER — Other Ambulatory Visit: Payer: Self-pay

## 2018-09-28 DIAGNOSIS — C549 Malignant neoplasm of corpus uteri, unspecified: Secondary | ICD-10-CM | POA: Diagnosis not present

## 2018-09-28 DIAGNOSIS — C541 Malignant neoplasm of endometrium: Secondary | ICD-10-CM | POA: Insufficient documentation

## 2018-09-28 LAB — GLUCOSE, CAPILLARY: Glucose-Capillary: 114 mg/dL — ABNORMAL HIGH (ref 70–99)

## 2018-09-28 MED ORDER — FLUDEOXYGLUCOSE F - 18 (FDG) INJECTION
6.6700 | Freq: Once | INTRAVENOUS | Status: AC | PRN
  Administered 2018-09-28: 6.67 via INTRAVENOUS

## 2018-09-29 ENCOUNTER — Telehealth: Payer: Self-pay | Admitting: *Deleted

## 2018-09-29 NOTE — Telephone Encounter (Signed)
Per Dr. Skeet Latch I have scheduled the patient's appt to April 23rd and explained that Dr. Skeet Latch will call her with the scan results

## 2018-09-29 NOTE — Telephone Encounter (Signed)
Called and spoke with the patient regarding her appt for Thursday. Explained that Dr. Skeet Latch will still be seeing patients. Patient was asked if she has been sick (cough/fever/sob) and traveled in the last 2 weeks. Patient stated "I got back from [place] last week.

## 2018-10-01 ENCOUNTER — Inpatient Hospital Stay: Payer: Medicare HMO | Admitting: Gynecologic Oncology

## 2018-10-01 ENCOUNTER — Other Ambulatory Visit: Payer: Self-pay | Admitting: Gynecologic Oncology

## 2018-10-01 ENCOUNTER — Telehealth (HOSPITAL_BASED_OUTPATIENT_CLINIC_OR_DEPARTMENT_OTHER): Payer: Medicare HMO | Admitting: Gynecologic Oncology

## 2018-10-01 ENCOUNTER — Ambulatory Visit: Payer: Medicare HMO | Admitting: Gynecologic Oncology

## 2018-10-01 DIAGNOSIS — C541 Malignant neoplasm of endometrium: Secondary | ICD-10-CM

## 2018-10-01 DIAGNOSIS — R9389 Abnormal findings on diagnostic imaging of other specified body structures: Secondary | ICD-10-CM

## 2018-10-01 DIAGNOSIS — R948 Abnormal results of function studies of other organs and systems: Secondary | ICD-10-CM

## 2018-10-01 NOTE — Progress Notes (Signed)
GYN ONCOLOGY OFFICE VISIT  PCP Nada Libman  CC:  Serous endometrial cancer, vaginal recurrence  Assessment/Plan:  [name]  is a 78 y.o.  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  PET imaging notable for hypermetabolic activity and right axillary lymph nodes.  Of note the breast cancer at age 57 was left-sided and a reducing mastectomy was performed on the right.   Will obtain ultrasound-guided biopsy of the right axillary node.  If the right axillary node is positive for metastatic serous endometrial cancer will treat with carboplatin and taxane chemotherapy with consideration for vaginal cuff brachytherapy.   If the right axillary node is nondiagnostic will refer for axillary node excision  HPI: Vicki Cervantes is a 78 y.o.  old parous woman who is seen in consultation at the request of Dr Stann Mainland for high grade serous endometrial cancer. Oncology History     Vicki Cervantes  has a remote history ofl eft  breast cancer at age 15 but received BRCA testing approximately 5 years ago which was negative. Her cancer was treated with surgery but no radiation or chemotherapy.        Endometrial cancer ([place])   09/30/2016 Initial Diagnosis    Endometrial cancer ([place])    09/2016 Imaging    CT C/A/P 09/2016 IMPRESSION: 1. Thickening of the endometrial canal up to 19 mm in fundus, presumably corresponding to the patient's reported endometrial carcinoma. 2. Multiple tiny pulmonary nodules scattered throughout the lungs bilaterally measuring 4 mm or less in size. Nodules of this size are typically considered statistically likely benign. In the setting of known primary malignancy, metastatic disease to the lungs is not excluded, but is not strongly favored on today's examination. Attention on followup studies is recommended to ensure the stability or resolution of these nodules. 3. Subcentimeter low-attenuation lesion in the central aspect of segment 8 of the liver is  too small to characterize. This is statistically likely a tiny cyst, but warrants attention on follow-up studies to exclude the possibility of a solitary hepatic metastasis. 4. 1.5 x 1.5 x 1.7 cm well-circumscribed lesion in the proximal stomach. This is of uncertain etiology and significance, and could represent a benign gastric polyp, however, further evaluation with nonemergent endoscopy is suggested in the near future for further evaluation. 5. **An incidental finding of potential clinical significance has been found. 1.1 x 1.6 cm thyroid nodule in the inferior aspect of the right lobe of the thyroid gland. Follow-up evaluation with nonemergent thyroid ultrasound is recommended in the near future to better evaluate this finding. This recommendation follows ACR consensuss guidelines: Managing Incidental Thyroid Nodules Detected on Imaging: White Paper of the ACR Incidental Thyroid Findings Committee. J Am Coll Radiol 2015;12(2):143-150.** 6. Aortic atherosclerosis, in addition to left anterior descending coronary artery disease    10/22/2016 Surgery    Robotic assisted total hysterectomy, BSO and bilateral pelvic lymphadenectomy  Final pathology revealed a 3cm polyp containing serous carcinoma but with no myometrial invasion, no LVSI and negative nodes.  Stage IA Uterine serous cancer    07/04/2017 Genetic Testing    The patient had genetic testing due to a personal history of breast and uterine cancer, and a family history of stomach cancer.  The Multi-Cancer Panel was ordered. The Multi-Cancer Panel offered by [facility] includes sequencing and/or deletion duplication testing of the following 83 genes: ALK, APC, ATM, AXIN2,BAP1,  BARD1, BLM, BMPR1A, BRCA1, BRCA2, BRIP1, CASR, CDC73, CDH1, CDK4, CDKN1B, CDKN1C, CDKN2A (p14ARF),  CDKN2A (p16INK4a), CEBPA, CHEK2, CTNNA1, DICER1, DIS3L2, EGFR (c.2369C>T, p.Thr790Met variant only), EPCAM (Deletion/duplication testing only), FH, FLCN, GATA2, GPC3, GREM1  (Promoter region deletion/duplication testing only), HOXB13 (c.251G>A, p.Gly84Glu), HRAS, KIT, MAX, MEN1, MET, MITF (c.952G>A, p.Glu318Lys variant only), MLH1, MSH2, MSH3, MSH6, MUTYH, NBN, NF1, NF2, NTHL1, PALB2, PDGFRA, PHOX2B, PMS2, POLD1, POLE, POT1, PRKAR1A, PTCH1, PTEN, RAD50, RAD51C, RAD51D, RB1, RECQL4, RET, RUNX1, SDHAF2, SDHA (sequence changes only), SDHB, SDHC, SDHD, SMAD4, SMARCA4, SMARCB1, SMARCE1, STK11, SUFU, TERC, TERT, TMEM127, TP53, TSC1, TSC2, VHL, WRN and WT1.   Results: No pathogenic variants were identified.  A variant of uncertain significance in the gene APC was identified.  c.791A>G (p.Gln264Arg).  The date of this test report is 07/04/2017.      Genetic Testing    Patient has genetic testing done for MSI. 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     11/2017 Imaging    CT Chest/Abd/Pelvis to follow pulmonary nodule and gastric mass IMPRESSION: 1. Stable CT of the chest. Small pulmonary nodules are unchanged when compared with previous exam. 2. No new findings identified. 3. Subcentimeter low-attenuation lesions within the liver are remain too small to characterize but are stable from prior exam. 4. Persistent indeterminate low-attenuation structure within the proximal stomach is unchanged measuring 1.4 cm. Correlation with direct visualization is advised    06/2018 Imaging     CT CHEST  06/2018 Lungs/Pleura: Stable scattered sub-cm pulmonary nodules are again seen bilaterally and are stable compared to previous studies. No new or enlarging pulmonary nodules or masses identified. No evidence of pulmonary infiltrate or pleural effusion.    09/17/2018 Relapse/Recurrence    Presented with c/o vagina discharge.  Lesion noted at the left vaginal apex 19m lesion removed Path c/w high grade serous cancer    09/28/2018 Imaging    PET IMPRESSION: 1. Two hypermetabolic axial lymph nodes. Unusual site for metastatic endometrial carcinoma  however the activity is more intense than typically seen in reactive adenopathy. Suggest ultrasound-guided percutaneous biopsy of the larger RIGHT axial lymph node 2. No evidence of local recurrence at the vaginal cuff.  3. No metastatic adenopathy in the abdomen or pelvis. 4. Stable small pulmonary nodules     Social Hx:   Social History   Socioeconomic History  . Marital status: Widowed    Spouse name: Not on file  . Number of children: 3  . Years of education: Not on file  . Highest education level: Not on file  Occupational History  . 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 Financial resource strain: Not on file  . Food insecurity:    Worry: Not on file    Inability: Not on file  . Transportation needs:    Medical: Not on file    Non-medical: Not on file  Tobacco Use  . Smoking status: Never Smoker  . Smokeless tobacco: Never Used  Substance and Sexual Activity  . Alcohol use: Yes    Alcohol/week: 0.0 standard drinks    Comment: Wine Occas.  . Drug use: No  . Sexual activity: Never  Lifestyle  . Physical activity:    Days per week: Not on file    Minutes per session: Not on file  . Stress: Not on file  Relationships  . Social connections:    Talks on phone: Not on file    Gets together: Not on file    Attends religious service: Not on file  Active member of club or organization: Not on file    Attends meetings of clubs or organizations: Not on file    Relationship status: Not on file  . Intimate partner violence:    Fear of current or ex partner: Not on file    Emotionally abused: Not on file    Physically abused: Not on file    Forced sexual activity: Not on file  Other Topics Concern  . Not on file  Social History Narrative  . Not on file  Spends most of  Her time in [place] with her daughter  Past Surgical Hx:  Past Surgical History:  Procedure Laterality Date  . COLONOSCOPY    . EUS N/A 01/07/2013    Procedure: UPPER ENDOSCOPIC ULTRASOUND (EUS) LINEAR;  Surgeon: Milus Banister, MD;  Location: [facility] ENDOSCOPY;  Service: Endoscopy;  Laterality: N/A;  . EUS N/A 01/05/2015   Procedure: UPPER ENDOSCOPIC ULTRASOUND (EUS) LINEAR;  Surgeon: Milus Banister, MD;  Location: [facility] ENDOSCOPY;  Service: Endoscopy;  Laterality: N/A;  . FOOT SURGERY Left 2009-2010   x2. Surgery in 2011  . LEFT HEART CATHETERIZATION WITH CORONARY ANGIOGRAM N/A 01/24/2014   Procedure: LEFT HEART CATHETERIZATION WITH CORONARY ANGIOGRAM;  Surgeon: Peter M Martinique, MD;  Location: [facility] CATH LAB;  Service: Cardiovascular;  Laterality: N/A;  . LUMBAR [place] SURGERY  01-01-13  . MASTECTOMY, RADICAL Left 1982  . OVARY SURGERY  1972-1973  . ROBOTIC ASSISTED TOTAL HYSTERECTOMY WITH BILATERAL SALPINGO OOPHERECTOMY N/A 10/22/2016   Procedure: XI ROBOTIC ASSISTED TOTAL HYSTERECTOMY WITH RIGHT SALPINGO OOPHORECTOMY; INJECTION OF GREEN DYE WITH EXCISION OF BILATERAL PELVIC LYMPH NODES;  Surgeon: Janie Morning, MD;  Location: [facility] ORS;  Service: Gynecology;  Laterality: N/A;  . ROTATOR CUFF REPAIR Left   . SENTINEL NODE BIOPSY N/A 10/22/2016   Procedure: SENTINEL NODE BIOPSY;  Surgeon: Janie Morning, MD;  Location: [facility] ORS;  Service: Gynecology;  Laterality: N/A;  . SIMPLE MASTECTOMY Left 1982  . TOTAL KNEE ARTHROPLASTY Bilateral 2009-2010   x2  . UPPER GASTROINTESTINAL ENDOSCOPY      Past Medical Hx:  Past Medical History:  Diagnosis Date  . Aortic atherosclerosis ([place])   . Arthritis   . Blood transfusion without reported diagnosis   . Breast cancer ([place])     LEFT, '82-left breast cancer-surgery only  . Cataract    removed both eyes  . Complication of anesthesia    nausea  . Diverticulosis   . Esophageal stricture   . Family history of stomach cancer   . GERD (gastroesophageal reflux disease)   . Hemorrhoids   . Hiatal hernia   . High triglycerides   . Hypercholesteremia   . Hypertension   . Hypothyroidism   . Osteoarthritis   .  PONV (postoperative nausea and vomiting)   . Uterine cancer [facility])   Social Hx: Has a daughter who lives in [place] a son who lives in [place] and and another son who lives in [place].  Enjoys being in the lives of her children and grandchildren  Past Gynecological History:  G2P2 No LMP recorded. Patient is postmenopausal.  Family Hx:  Family History  Problem Relation Age of Onset  . Heart disease Mother   . Other Mother        blood disease  . Heart attack Father   . Colon polyps Sister   . Heart attack Brother   . Colon polyps Brother   . Stomach cancer Maternal Aunt   . Colon cancer Neg  Hx   . Esophageal cancer Neg Hx   . Rectal cancer Neg Hx   . Pancreatic cancer Neg Hx     Review of Systems:  Constitutional  Feels well,    ENT Normal appearing ears and nares bilaterally Skin/Breast  No rash, sores, jaundice, itching, dryness.   Cardiovascular  No chest pain, shortness of breath, or edema  Pulmonary  No cough or wheeze.  Gastro Intestinal  No nausea, vomitting, or diarrhoea. No bright red blood per rectum, no abdominal pain, change in bowel movement, or constipation.  Genito Urinary  No frequency, urgency, dysuria, no vaginal bleeding Musculo Skeletal  No myalgia, arthralgia, joint swelling or pain  Neurologic  No weakness, numbness, change in gait,  Psychology  No depression, anxiety, insomnia.    Physical Exam: - There were no vitals taken for this visit. Wt Readings from Last 3 Encounters:  09/17/18 137 lb (62.1 kg)  07/02/18 139 lb (63 kg)  05/13/18 141 lb (64 kg)     Janie Morning, MD

## 2018-10-07 ENCOUNTER — Telehealth: Payer: Self-pay

## 2018-10-07 ENCOUNTER — Other Ambulatory Visit: Payer: Self-pay | Admitting: Radiology

## 2018-10-07 NOTE — Telephone Encounter (Signed)
Incoming call from pt - verifying whether she needs to keep upcoming appt for 10-09-2018 and per Joylene John NP- told pt yes very important she keep appt- pt voiced understanding.  I also let her know she could bring 1 visitor but they would have to remain in lobby area. Pt voiced understanding and no other needs per at this time.

## 2018-10-09 ENCOUNTER — Other Ambulatory Visit: Payer: Self-pay

## 2018-10-09 ENCOUNTER — Ambulatory Visit (HOSPITAL_COMMUNITY)
Admission: RE | Admit: 2018-10-09 | Discharge: 2018-10-09 | Disposition: A | Discharge status: Home / Self Care | Payer: Medicare HMO | Source: Ambulatory Visit | Attending: Gynecologic Oncology | Admitting: Gynecologic Oncology

## 2018-10-09 ENCOUNTER — Ambulatory Visit (HOSPITAL_COMMUNITY)
Admission: RE | Admit: 2018-10-09 | Discharge: 2018-10-09 | Disposition: A | Discharge status: Home / Self Care | Payer: Medicare HMO | Source: Ambulatory Visit

## 2018-10-09 ENCOUNTER — Encounter (HOSPITAL_COMMUNITY): Payer: Self-pay

## 2018-10-09 DIAGNOSIS — R948 Abnormal results of function studies of other organs and systems: Secondary | ICD-10-CM | POA: Insufficient documentation

## 2018-10-09 DIAGNOSIS — Z8542 Personal history of malignant neoplasm of other parts of uterus: Secondary | ICD-10-CM | POA: Diagnosis not present

## 2018-10-09 DIAGNOSIS — Z853 Personal history of malignant neoplasm of breast: Secondary | ICD-10-CM | POA: Insufficient documentation

## 2018-10-09 DIAGNOSIS — R590 Localized enlarged lymph nodes: Secondary | ICD-10-CM | POA: Insufficient documentation

## 2018-10-09 DIAGNOSIS — C773 Secondary and unspecified malignant neoplasm of axilla and upper limb lymph nodes: Secondary | ICD-10-CM | POA: Diagnosis not present

## 2018-10-09 DIAGNOSIS — C541 Malignant neoplasm of endometrium: Secondary | ICD-10-CM

## 2018-10-09 DIAGNOSIS — I898 Other specified noninfective disorders of lymphatic vessels and lymph nodes: Secondary | ICD-10-CM | POA: Diagnosis not present

## 2018-10-09 DIAGNOSIS — C801 Malignant (primary) neoplasm, unspecified: Secondary | ICD-10-CM | POA: Diagnosis not present

## 2018-10-09 LAB — CBC WITH DIFFERENTIAL/PLATELET
Abs Immature Granulocytes: 0.02 10*3/uL (ref 0.00–0.07)
Basophils Absolute: 0.1 10*3/uL (ref 0.0–0.1)
Basophils Relative: 1 %
Eosinophils Absolute: 0.2 10*3/uL (ref 0.0–0.5)
Eosinophils Relative: 3 %
HEMATOCRIT: 34.5 % — AB (ref 36.0–46.0)
Hemoglobin: 11.8 g/dL — ABNORMAL LOW (ref 12.0–15.0)
Immature Granulocytes: 0 %
Lymphocytes Relative: 23 %
Lymphs Abs: 1.3 10*3/uL (ref 0.7–4.0)
MCH: 31.3 pg (ref 26.0–34.0)
MCHC: 34.2 g/dL (ref 30.0–36.0)
MCV: 91.5 fL (ref 80.0–100.0)
Monocytes Absolute: 0.5 10*3/uL (ref 0.1–1.0)
Monocytes Relative: 9 %
NRBC: 0 % (ref 0.0–0.2)
Neutro Abs: 3.7 10*3/uL (ref 1.7–7.7)
Neutrophils Relative %: 64 %
Platelets: 227 10*3/uL (ref 150–400)
RBC: 3.77 MIL/uL — ABNORMAL LOW (ref 3.87–5.11)
RDW: 12 % (ref 11.5–15.5)
WBC: 5.8 10*3/uL (ref 4.0–10.5)

## 2018-10-09 LAB — PROTIME-INR
INR: 1 (ref 0.8–1.2)
Prothrombin Time: 13 seconds (ref 11.4–15.2)

## 2018-10-09 LAB — BASIC METABOLIC PANEL
Anion gap: 13 (ref 5–15)
BUN: 26 mg/dL — ABNORMAL HIGH (ref 8–23)
CO2: 25 mmol/L (ref 22–32)
Calcium: 9.3 mg/dL (ref 8.9–10.3)
Chloride: 100 mmol/L (ref 98–111)
Creatinine, Ser: 1.2 mg/dL — ABNORMAL HIGH (ref 0.44–1.00)
GFR calc Af Amer: 50 mL/min — ABNORMAL LOW (ref >60)
GFR calc non Af Amer: 43 mL/min — ABNORMAL LOW (ref >60)
Glucose, Bld: 106 mg/dL — ABNORMAL HIGH (ref 70–99)
Potassium: 3.3 mmol/L — ABNORMAL LOW (ref 3.5–5.1)
Sodium: 138 mmol/L (ref 135–145)

## 2018-10-09 MED ORDER — MIDAZOLAM HCL 2 MG/2ML IJ SOLN
INTRAMUSCULAR | Status: AC | PRN
  Administered 2018-10-09 (×2): 1 mg via INTRAVENOUS

## 2018-10-09 MED ORDER — LIDOCAINE HCL (PF) 1 % IJ SOLN
INTRAMUSCULAR | Status: AC | PRN
  Administered 2018-10-09: 5 mL

## 2018-10-09 MED ORDER — LIDOCAINE HCL 1 % IJ SOLN
INTRAMUSCULAR | Status: AC
  Filled 2018-10-09: qty 20

## 2018-10-09 MED ORDER — MIDAZOLAM HCL 2 MG/2ML IJ SOLN
INTRAMUSCULAR | Status: AC
  Filled 2018-10-09: qty 2

## 2018-10-09 MED ORDER — FENTANYL CITRATE (PF) 100 MCG/2ML IJ SOLN
INTRAMUSCULAR | Status: AC | PRN
  Administered 2018-10-09: 50 ug via INTRAVENOUS

## 2018-10-09 MED ORDER — FENTANYL CITRATE (PF) 100 MCG/2ML IJ SOLN
INTRAMUSCULAR | Status: AC
  Filled 2018-10-09: qty 2

## 2018-10-09 MED ORDER — SODIUM CHLORIDE 0.9 % IV SOLN
INTRAVENOUS | Status: DC
  Administered 2018-10-09: 12:00:00 via INTRAVENOUS

## 2018-10-09 NOTE — Procedures (Signed)
Interventional Radiology Procedure:   Indications: History of left breast cancer and endometrial cancer.  Suspicious right axillary lymph nodes on PET  Procedure: US guided right axillary lymph node biopsy  Findings: Largest right axillary lymph node was biopsied, 7 cores   Complications: None     EBL: Less than 10 ml  Plan: Discharge to home today.     Candice Tobey R. Anselm Pancoast, MD  Pager: [phone]

## 2018-10-09 NOTE — Discharge Instructions (Signed)
Moderate Conscious Sedation, Adult, Care After °These instructions provide you with information about caring for yourself after your procedure. Your health care provider may also give you more specific instructions. Your treatment has been planned according to current medical practices, but problems sometimes occur. Call your health care provider if you have any problems or questions after your procedure. °What can I expect after the procedure? °After your procedure, it is common: °· To feel sleepy for several hours. °· To feel clumsy and have poor balance for several hours. °· To have poor judgment for several hours. °· To vomit if you eat too soon. °Follow these instructions at home: °For at least 24 hours after the procedure: ° °· Do not: °? Participate in activities where you could fall or become injured. °? Drive. °? Use heavy machinery. °? Drink alcohol. °? Take sleeping pills or medicines that cause drowsiness. °? Make important decisions or sign legal documents. °? Take care of children on your own. °· Rest. °Eating and drinking °· Follow the diet recommended by your health care provider. °· If you vomit: °? Drink water, juice, or soup when you can drink without vomiting. °? Make sure you have little or no nausea before eating solid foods. °General instructions °· Have a responsible adult stay with you until you are awake and alert. °· Take over-the-counter and prescription medicines only as told by your health care provider. °· If you smoke, do not smoke without supervision. °· Keep all follow-up visits as told by your health care provider. This is important. °Contact a health care provider if: °· You keep feeling nauseous or you keep vomiting. °· You feel light-headed. °· You develop a rash. °· You have a fever. °Get help right away if: °· You have trouble breathing. °This information is not intended to replace advice given to you by your health care provider. Make sure you discuss any questions you have  with your health care provider. °Document Released: 04/21/2013 Document Revised: 12/04/2015 Document Reviewed: 10/21/2015 °Elsevier Interactive Patient Education © 2019 [employer] ° ° °You may shower and remove your dressing tomorrow. ° °Needle Biopsy, Care After °These instructions tell you how to care for yourself after your procedure. Your doctor may also give you more specific instructions. Call your doctor if you have any problems or questions. °What can I expect after the procedure? °After the procedure, it is common to have: °· Soreness. °· Bruising. °· Mild pain. °Follow these instructions at home: ° °· Return to your normal activities as told by your doctor. Ask your doctor what activities are safe for you. °· Take over-the-counter and prescription medicines only as told by your doctor. °· Wash your hands with soap and water before you change your bandage (dressing). If you cannot use soap and water, use hand sanitizer. °· Follow instructions from your doctor about: °? How to take care of your puncture site. °? When and how to change your bandage. °? When to remove your bandage. °· Check your puncture site every day for signs of infection. Watch for: °? Redness, swelling, or pain. °? Fluid or blood.  °? Pus or a bad smell. °? Warmth. °· Do not take baths, swim, or use a hot tub until your doctor approves. Ask your doctor if you may take showers. You may only be allowed to take sponge baths. °· Keep all follow-up visits as told by your doctor. This is important. °Contact a doctor if you have: °· A fever. °· Redness, swelling, or   pain at the puncture site, and it lasts longer than a few days. °· Fluid, blood, or pus coming from the puncture site. °· Warmth coming from the puncture site. °Get help right away if: °· You have a lot of bleeding from the puncture site. °Summary °· After the procedure, it is common to have soreness, bruising, or mild pain at the puncture site. °· Check your puncture site every  day for signs of infection, such as redness, swelling, or pain. °· Get help right away if you have severe bleeding from your puncture site. °This information is not intended to replace advice given to you by your health care provider. Make sure you discuss any questions you have with your health care provider. °Document Released: 06/13/2008 Document Revised: 07/14/2017 Document Reviewed: 07/14/2017 °Elsevier Interactive Patient Education © 2019 [employer] ° °

## 2018-10-09 NOTE — Consult Note (Signed)
Chief Complaint: Patient was seen in consultation today for image guided right axillary lymph node biopsy  Referring Physician(s): Cross,Melissa D  Supervising Physician: Markus Daft  Patient Status: [facility] - Out-pt  History of Present Illness: Vicki Cervantes is a 78 y.o. female with remote history of left breast cancer and endometrial cancer diagnosed in 2018, status post total hysterectomy, BSO and bilateral pelvic lymphadenectomy.  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  PET scan on 09/28/2018 revealed:  1. Two hypermetabolic axial lymph nodes. Unusual site for metastatic endometrial carcinoma however the activity is more intense than typically seen in reactive adenopathy. Suggest ultrasound-guided percutaneous biopsy of the larger RIGHT axial lymph node 2. No evidence of local recurrence at the vaginal cuff. 3. No metastatic adenopathy in the abdomen or pelvis. 4. Stable small pulmonary nodules  She presents today for image guided right axillary lymph node biopsy for further evaluation.  Past Medical History:  Diagnosis Date   Aortic atherosclerosis ([place])    Arthritis    Blood transfusion without reported diagnosis    Breast cancer ([place])     LEFT, '82-left breast cancer-surgery only   Cataract    removed both eyes   Complication of anesthesia    nausea   Diverticulosis    Esophageal stricture    Family history of stomach cancer    GERD (gastroesophageal reflux disease)    Hemorrhoids    Hiatal hernia    High triglycerides    Hypercholesteremia    Hypertension    Hypothyroidism    Osteoarthritis    PONV (postoperative nausea and vomiting)    Uterine cancer ([place])     Past Surgical History:  Procedure Laterality Date   COLONOSCOPY     EUS N/A 01/07/2013   Procedure: UPPER ENDOSCOPIC ULTRASOUND (EUS) LINEAR;  Surgeon: Milus Banister, MD;  Location: [facility];  Service: Endoscopy;  Laterality: N/A;    EUS N/A 01/05/2015   Procedure: UPPER ENDOSCOPIC ULTRASOUND (EUS) LINEAR;  Surgeon: Milus Banister, MD;  Location: [facility] ENDOS[facility];  Service: Endoscopy;  Laterality: N/A;   FOOT SURGERY Left 2009-2010   x2. Surgery in 2011   [place] N/A 01/24/2014   Procedure: LEFT HEART CATHETERIZATION WITH CORONARY ANGIOGRAM;  Surgeon: Peter M Martinique, MD;  Location: [facility] CATH LAB;  Service: Cardiovascular;  Laterality: N/A;   LUMBAR DISC SURGERY  01-01-13   MASTECTOMY, RADICAL Left 1982   OVARY SURGERY  1972-1973   ROBOTIC ASSISTED TOTAL HYSTERECTOMY WITH BILATERAL SALPINGO OOPHERECTOMY N/A 10/22/2016   Procedure: XI ROBOTIC ASSISTED TOTAL HYSTERECTOMY WITH RIGHT SALPINGO OOPHORECTOMY; INJECTION OF GREEN DYE WITH EXCISION OF BILATERAL PELVIC LYMPH NODES;  Surgeon: Janie Morning, MD;  Location: [facility] ORS;  Service: Gynecology;  Laterality: N/A;   ROTATOR CUFF REPAIR Left    SENTINEL NODE BIOPSY N/A 10/22/2016   Procedure: SENTINEL NODE BIOPSY;  Surgeon: Janie Morning, MD;  Location: [facility];  Service: Gynecology;  Laterality: N/A;   SIMPLE MASTECTOMY Left 1982   TOTAL KNEE ARTHROPLASTY Bilateral 2009-2010   x2   UPPER GASTROINTESTINAL ENDOSCOPY      Allergies: Patient has no known allergies.  Medications: Prior to Admission medications   Medication Sig Start Date End Date Taking? Authorizing Provider  acetaminophen (TYLENOL) 500 MG tablet Take 1,000 mg by mouth every 6 (six) hours as needed.    [provider]  aspirin EC 81 MG tablet Take 81 mg by mouth daily.    [provider]  atorvastatin (LIPITOR) 20 MG tablet Take 20 mg by mouth every evening.     [provider]  Calcium Carbonate-Vitamin D (CALCIUM-VITAMIN D) 500-200 MG-UNIT per tablet Take 1 tablet by mouth daily.    [provider]  citalopram (CELEXA) 20 MG tablet Take 20 mg by mouth at bedtime.     [provider]  levothyroxine (SYNTHROID,  LEVOTHROID) 100 MCG tablet Take 100 mcg by mouth daily before breakfast.     [provider]  losartan-hydrochlorothiazide (HYZAAR) 100-25 MG tablet  08/11/17   [provider]  metoprolol succinate (TOPROL-XL) 25 MG 24 hr tablet Take 25 mg by mouth daily.    [provider]  Multiple Vitamin (MULTIVITAMIN WITH MINERALS) TABS Take 1 tablet by mouth daily.    [provider]  pantoprazole (PROTONIX) 40 MG tablet Take 40 mg by mouth every morning.     [provider]  valACYclovir (VALTREX) 1000 MG tablet valacyclovir 1 gram tablet    [provider]     Family History  Problem Relation Age of Onset   Heart disease Mother    Other Mother        blood disease   Heart attack Father    Colon polyps Sister    Heart attack Brother    Colon polyps Brother    Stomach cancer Maternal Aunt    Colon cancer Neg Hx    Esophageal cancer Neg Hx    Rectal cancer Neg Hx    Pancreatic cancer Neg Hx     Social History   Socioeconomic History   Marital status: Widowed    Spouse name: Not on file   Number of children: 3   Years of education: Not on file   Highest education level: Not on file  Occupational History   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   Drug use: No   Sexual activity: Never  Lifestyle   Physical activity:    Days per week: Not on file    Minutes per session: Not on file   Stress: Not on file  Relationships   Social connections:    Talks on phone: Not on file    Gets together: Not  on file    Attends religious service: Not on file    Active member of club or organization: Not on file    Attends meetings of clubs or organizations: Not on file    Relationship status: Not on file  Other Topics Concern   Not on file  Social History Narrative   Not on file      Review of Systems denies fever, headache, chest pain, dyspnea, cough, abdominal/back pain, nausea, vomiting or bleeding  Vital Signs: BP (!) 149/66    Pulse 68    Temp 97.8 F (36.6 C) (Oral)    Resp 16    SpO2 95%   Physical Exam awake, alert.  Chest clear to auscultation bilaterally.  Heart with regular  rate and rhythm.  Abdomen soft, positive bowel sounds, nontender.  No significant lower extremity edema.  Imaging: Nm Pet Image Initial (pi) Skull Base To Thigh  Result Date: 09/28/2018 CLINICAL DATA:  Initial treatment strategy for endometrial carcinoma. History of endometrial carcinoma status post hysterectomy. Recent vaginal biopsy (09/17/18) revealed high-grade serous carcinoma consistent recurrence. EXAM: NUCLEAR MEDICINE PET SKULL BASE TO THIGH TECHNIQUE: 6.7 mCi F-18 FDG was injected intravenously. Full-ring PET imaging was performed from the skull base to thigh after the radiotracer. CT data was obtained and used for attenuation correction and anatomic localization. Fasting blood glucose: 114 mg/dl COMPARISON:  06/30/2018, CT chest FINDINGS: Mediastinal blood pool activity: SUV max 2.5 NECK: No hypermetabolic lymph nodes in the neck. Incidental CT findings: none CHEST: Two RIGHT axillary lymph nodes have focal hypermetabolic activity. The more superior node measures 10 mm (image 57/4) with SUV max equal 7.9. This node is similar size to 9 mm on CT 12/03/2017 and increased in size from 2018. More inferior node measures 6 mm (image 63/4) with SUV max equal 3.0. This a smaller node is increased in size from 3 mm on 12/03/2017. Both nodes similar to more recent CT 06/30/2018 No additional hypermetabolic  mediastinal hilar nodes. Incidental CT findings: A small 3 mm RIGHT upper lobe nodule (image 23/3) unchanged. Similar 3 mm nodule in the superior aspect LEFT lower lobe (image 25/8 unchanged. ABDOMEN/PELVIS: No abnormal hypermetabolic activity within the liver, pancreas, adrenal glands, or spleen. No hypermetabolic lymph nodes in the abdomen or pelvis. No hypermetabolic activity within the vaginal cuff. No hypermetabolic pelvic lymph nodes. Incidental CT findings: Post hysterectomy.  Sigmoid diverticulosis. SKELETON: No focal hypermetabolic activity to suggest skeletal metastasis. Incidental CT findings: none IMPRESSION: 1. Two hypermetabolic axial lymph nodes. Unusual site for metastatic endometrial carcinoma however the activity is more intense than typically seen in reactive adenopathy. Suggest ultrasound-guided percutaneous biopsy of the larger RIGHT axial lymph node 2. No evidence of local recurrence at the vaginal cuff. 3. No metastatic adenopathy in the abdomen or pelvis. 4. Stable small pulmonary nodules Electronically Signed   By: Suzy Bouchard M.D.   On: 09/28/2018 14:23    Labs:  CBC: Recent Labs    11/25/17 1300  WBC 7.0  HGB 12.3  HCT 35.7  PLT 235    COAGS: No results for input(s): INR, APTT in the last 8760 hours.  BMP: Recent Labs    11/25/17 1300 06/30/18 1011  NA 140 141  K 3.6 3.9  CL 103 105  CO2 28 27  GLUCOSE 103 113*  BUN 22 24*  CALCIUM 10.0 9.2  CREATININE 1.27* 1.30*  GFRNONAA 40* 39*  GFRAA 46* 46*    LIVER FUNCTION TESTS: Recent Labs    11/25/17 1300  BILITOT 0.4  AST 25  ALT 23  ALKPHOS 83  PROT 7.3  ALBUMIN 4.1    TUMOR MARKERS: No results for input(s): AFPTM, CEA, CA199, CHROMGRNA in the last 8760 hours.  Assessment and Plan:  78 y.o. female with remote history of left breast cancer and endometrial cancer diagnosed in 2018, status post total hysterectomy, BSO and bilateral pelvic lymphadenectomy.  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  PET scan on 09/28/2018 revealed:  1. Two hypermetabolic axial lymph nodes. Unusual site for metastatic endometrial carcinoma however the activity is more intense than typically seen in reactive adenopathy. Suggest ultrasound-guided percutaneous biopsy of the larger RIGHT axial lymph node 2. No evidence of local recurrence at  the vaginal cuff. 3. No metastatic adenopathy in the abdomen or pelvis. 4. Stable small pulmonary nodules  She presents today for image guided right axillary lymph node biopsy for further evaluation.Risks and benefits of procedure was discussed with the patient  including, but not limited to bleeding, infection, damage to adjacent structures or low yield requiring additional tests.  All of the questions were answered and there is agreement to proceed.  Consent signed and in chart.     Thank you for this interesting consult.  I greatly enjoyed meeting LAMIYA NAAS and look forward to participating in their care.  A copy of this report was sent to the requesting provider on this date.  Electronically Signed: D. Rowe Robert, PA-C 10/09/2018, 12:03 PM   I spent a total of 20 minutes  in face to face in clinical consultation, greater than 50% of which was counseling/coordinating care for image guided right axillary lymph node biopsy

## 2018-10-13 ENCOUNTER — Telehealth: Payer: Self-pay | Admitting: Oncology

## 2018-10-13 DIAGNOSIS — C541 Malignant neoplasm of endometrium: Secondary | ICD-10-CM

## 2018-10-13 NOTE — Telephone Encounter (Signed)
Called Vicki Cervantes with appointment to see Dr. Alvy Bimler tomorrow, 10/14/18 at 10 am.  Advised her of the no visitor policy and to arrive 30 minutes early to check in.  She verbalized understanding and agreement.

## 2018-10-14 ENCOUNTER — Other Ambulatory Visit: Payer: Self-pay

## 2018-10-14 ENCOUNTER — Inpatient Hospital Stay: Payer: Medicare HMO

## 2018-10-14 ENCOUNTER — Inpatient Hospital Stay: Payer: Medicare HMO | Attending: Gynecologic Oncology | Admitting: Hematology and Oncology

## 2018-10-14 ENCOUNTER — Encounter: Payer: Self-pay | Admitting: Hematology and Oncology

## 2018-10-14 ENCOUNTER — Other Ambulatory Visit: Payer: Self-pay | Admitting: Hematology and Oncology

## 2018-10-14 ENCOUNTER — Telehealth: Payer: Self-pay | Admitting: Hematology and Oncology

## 2018-10-14 ENCOUNTER — Encounter: Payer: Self-pay | Admitting: Oncology

## 2018-10-14 ENCOUNTER — Telehealth: Payer: Self-pay | Admitting: Oncology

## 2018-10-14 VITALS — BP 143/73 | HR 75 | Temp 98.2°F | Resp 18 | Ht 62.5 in | Wt 135.2 lb

## 2018-10-14 DIAGNOSIS — M1990 Unspecified osteoarthritis, unspecified site: Secondary | ICD-10-CM | POA: Insufficient documentation

## 2018-10-14 DIAGNOSIS — I700 Atherosclerosis of aorta: Secondary | ICD-10-CM | POA: Diagnosis not present

## 2018-10-14 DIAGNOSIS — C541 Malignant neoplasm of endometrium: Secondary | ICD-10-CM | POA: Insufficient documentation

## 2018-10-14 DIAGNOSIS — E7800 Pure hypercholesterolemia, unspecified: Secondary | ICD-10-CM | POA: Diagnosis not present

## 2018-10-14 DIAGNOSIS — C779 Secondary and unspecified malignant neoplasm of lymph node, unspecified: Secondary | ICD-10-CM | POA: Insufficient documentation

## 2018-10-14 DIAGNOSIS — N183 Chronic kidney disease, stage 3 unspecified: Secondary | ICD-10-CM

## 2018-10-14 DIAGNOSIS — Z800 Family history of malignant neoplasm of digestive organs: Secondary | ICD-10-CM | POA: Diagnosis not present

## 2018-10-14 DIAGNOSIS — C773 Secondary and unspecified malignant neoplasm of axilla and upper limb lymph nodes: Secondary | ICD-10-CM

## 2018-10-14 DIAGNOSIS — Z7982 Long term (current) use of aspirin: Secondary | ICD-10-CM | POA: Insufficient documentation

## 2018-10-14 DIAGNOSIS — Z7189 Other specified counseling: Secondary | ICD-10-CM

## 2018-10-14 DIAGNOSIS — Z90710 Acquired absence of both cervix and uterus: Secondary | ICD-10-CM | POA: Diagnosis not present

## 2018-10-14 DIAGNOSIS — Z170 Estrogen receptor positive status [ER+]: Secondary | ICD-10-CM | POA: Insufficient documentation

## 2018-10-14 DIAGNOSIS — I129 Hypertensive chronic kidney disease with stage 1 through stage 4 chronic kidney disease, or unspecified chronic kidney disease: Secondary | ICD-10-CM | POA: Diagnosis not present

## 2018-10-14 DIAGNOSIS — Z853 Personal history of malignant neoplasm of breast: Secondary | ICD-10-CM | POA: Diagnosis not present

## 2018-10-14 DIAGNOSIS — I2510 Atherosclerotic heart disease of native coronary artery without angina pectoris: Secondary | ICD-10-CM | POA: Insufficient documentation

## 2018-10-14 DIAGNOSIS — Z79899 Other long term (current) drug therapy: Secondary | ICD-10-CM | POA: Insufficient documentation

## 2018-10-14 DIAGNOSIS — K219 Gastro-esophageal reflux disease without esophagitis: Secondary | ICD-10-CM | POA: Diagnosis not present

## 2018-10-14 DIAGNOSIS — R918 Other nonspecific abnormal finding of lung field: Secondary | ICD-10-CM | POA: Insufficient documentation

## 2018-10-14 DIAGNOSIS — Z9013 Acquired absence of bilateral breasts and nipples: Secondary | ICD-10-CM | POA: Insufficient documentation

## 2018-10-14 DIAGNOSIS — E039 Hypothyroidism, unspecified: Secondary | ICD-10-CM | POA: Diagnosis not present

## 2018-10-14 DIAGNOSIS — Z5112 Encounter for antineoplastic immunotherapy: Secondary | ICD-10-CM | POA: Diagnosis not present

## 2018-10-14 DIAGNOSIS — Z5111 Encounter for antineoplastic chemotherapy: Secondary | ICD-10-CM | POA: Diagnosis not present

## 2018-10-14 LAB — CBC WITH DIFFERENTIAL (CANCER CENTER ONLY)
Abs Immature Granulocytes: 0.03 10*3/uL (ref 0.00–0.07)
Basophils Absolute: 0.1 10*3/uL (ref 0.0–0.1)
Basophils Relative: 1 %
Eosinophils Absolute: 0.2 10*3/uL (ref 0.0–0.5)
Eosinophils Relative: 3 %
HCT: 35.2 % — ABNORMAL LOW (ref 36.0–46.0)
Hemoglobin: 12.1 g/dL (ref 12.0–15.0)
Immature Granulocytes: 0 %
Lymphocytes Relative: 24 %
Lymphs Abs: 1.8 10*3/uL (ref 0.7–4.0)
MCH: 31.2 pg (ref 26.0–34.0)
MCHC: 34.4 g/dL (ref 30.0–36.0)
MCV: 90.7 fL (ref 80.0–100.0)
Monocytes Absolute: 0.7 10*3/uL (ref 0.1–1.0)
Monocytes Relative: 9 %
Neutro Abs: 4.8 10*3/uL (ref 1.7–7.7)
Neutrophils Relative %: 63 %
Platelet Count: 227 10*3/uL (ref 150–400)
RBC: 3.88 MIL/uL (ref 3.87–5.11)
RDW: 11.9 % (ref 11.5–15.5)
WBC Count: 7.6 10*3/uL (ref 4.0–10.5)
nRBC: 0 % (ref 0.0–0.2)

## 2018-10-14 LAB — CMP (CANCER CENTER ONLY)
ALT: 16 U/L (ref 0–44)
AST: 18 U/L (ref 15–41)
Albumin: 4 g/dL (ref 3.5–5.0)
Alkaline Phosphatase: 84 U/L (ref 38–126)
Anion gap: 12 (ref 5–15)
BUN: 18 mg/dL (ref 8–23)
CO2: 27 mmol/L (ref 22–32)
Calcium: 9.6 mg/dL (ref 8.9–10.3)
Chloride: 102 mmol/L (ref 98–111)
Creatinine: 1.22 mg/dL — ABNORMAL HIGH (ref 0.44–1.00)
GFR, Est AFR Am: 49 mL/min — ABNORMAL LOW (ref >60)
GFR, Estimated: 42 mL/min — ABNORMAL LOW (ref >60)
Glucose, Bld: 110 mg/dL — ABNORMAL HIGH (ref 70–99)
Potassium: 3.9 mmol/L (ref 3.5–5.1)
Sodium: 141 mmol/L (ref 135–145)
Total Bilirubin: 0.3 mg/dL (ref 0.3–1.2)
Total Protein: 7.3 g/dL (ref 6.5–8.1)

## 2018-10-14 MED ORDER — LIDOCAINE-PRILOCAINE 2.5-2.5 % EX CREA: TOPICAL_CREAM | CUTANEOUS | 3 refills | Status: DC

## 2018-10-14 MED ORDER — ONDANSETRON HCL 8 MG PO TABS: 8.0000 mg | ORAL_TABLET | Freq: Three times a day (TID) | ORAL | 1 refills | Status: DC | PRN

## 2018-10-14 MED ORDER — PROCHLORPERAZINE MALEATE 10 MG PO TABS: 10.0000 mg | ORAL_TABLET | Freq: Four times a day (QID) | ORAL | 1 refills | Status: DC | PRN

## 2018-10-14 MED ORDER — DEXAMETHASONE 4 MG PO TABS: ORAL_TABLET | ORAL | 0 refills | Status: DC

## 2018-10-14 NOTE — Assessment & Plan Note (Signed)
She has borderline chronic kidney disease stage III I plan reduced dose carboplatin and AUC of 5 and paclitaxel at 135 mg/m

## 2018-10-14 NOTE — Progress Notes (Signed)
Patient was notified of port placement on 10/19/18 with instructions (NPO after 7 am, arrive at 12 pm, provide a number for a dirver).  She verbalized understanding and agreement.

## 2018-10-14 NOTE — Assessment & Plan Note (Signed)
I do not believe this is related to breast cancer The pathology report is consistent with metastatic uterine cancer

## 2018-10-14 NOTE — Assessment & Plan Note (Signed)
The lymph node is not palpable on exam We will follow with serial imaging study after 3 cycles of treatment

## 2018-10-14 NOTE — Assessment & Plan Note (Addendum)
We discussed the goals of care She have stage IV recurrent, metastatic disease Treatment goal is palliative but due to low disease burden, she can still achieve complete response with aggressive treatment My goal, if her pathology report showed HER-2 negative disease, would be to consider adding bevacizumab after we document response to treatment at cycle 4 and beyond that and to continue on maintenance bevacizumab long-term She will bring a copy of her advance directive in her next visit The patient has expressed desire to be DNR She appointed her daughter as her healthcare power of attorney

## 2018-10-14 NOTE — Telephone Encounter (Signed)
Requested Her2/Neu testing on accession [id number] with Rhonda in [facility]. 

## 2018-10-14 NOTE — Assessment & Plan Note (Signed)
I have reviewed imaging studies with the patient and given her a copy of her biopsy report I will get pathologist to add her to testing to see if she will qualify for trastuzumab I will get her scheduled for chemo education class, blood work, port placement and treatment to start as soon as possible  We reviewed the NCCN guidelines We discussed the role of chemotherapy. The intent is of palliative intent.  We discussed some of the risks, benefits, side-effects of carboplatin & Taxol. Treatment is intravenous, every 3 weeks x 6 cycles  Some of the short term side-effects included, though not limited to, including weight loss, life threatening infections, risk of allergic reactions, need for transfusions of blood products, nausea, vomiting, change in bowel habits, loss of hair, admission to hospital for various reasons, and risks of death.   Long term side-effects are also discussed including risks of infertility, permanent damage to nerve function, hearing loss, chronic fatigue, kidney damage with possibility needing hemodialysis, and rare secondary malignancy including bone marrow disorders.  The patient is aware that the response rates discussed earlier is not guaranteed.  After a long discussion, patient made an informed decision to proceed with the prescribed plan of care.   Patient education material was dispensed. We discussed premedication with dexamethasone before chemotherapy. I do not plan to give her prophylactic G-CSF support Due to reduced renal function, I plan to prescribe carboplatin AUC of 5 and paclitaxel at 135 mg per metered squared to start with I recommend 3 cycles of chemotherapy before repeat imaging study

## 2018-10-14 NOTE — Assessment & Plan Note (Signed)
The lung nodules have been present for many years It is likely benign Observe only

## 2018-10-14 NOTE — Progress Notes (Signed)
START ON PATHWAY REGIMEN - Uterine     A cycle is every 21 days:     Paclitaxel      Carboplatin   **Always confirm dose/schedule in your pharmacy ordering system**  Patient Characteristics: Papillary Serous and Clear Cell Histology, Newly Diagnosed, Resected Histology: Papillary Serous and Clear Cell Histology Therapeutic Status: Newly Diagnosed AJCC T Category: T1 AJCC N Category: N0 AJCC M Category: M1 AJCC 8 Stage Grouping: IVB Surgical Status: Resected Intent of Therapy: Non-Curative / Palliative Intent, Discussed with Patient

## 2018-10-14 NOTE — Progress Notes (Signed)
[place] NOTE  Patient Care Team: Burnard Bunting, [name]  ASSESSMENT & PLAN:  Endometrial cancer [facility]) I have reviewed imaging studies with the patient and given her a copy of her biopsy report I will get pathologist to add her to testing to see if she will qualify for trastuzumab I will get her scheduled for chemo education class, blood work, port placement and treatment to start as soon as possible  We reviewed the NCCN guidelines We discussed the role of chemotherapy. The intent is of palliative intent.  We discussed some of the risks, benefits, side-effects of carboplatin & Taxol. Treatment is intravenous, every 3 weeks x 6 cycles  Some of the short term side-effects included, though not limited to, including weight loss, life threatening infections, risk of allergic reactions, need for transfusions of blood products, nausea, vomiting, change in bowel habits, loss of hair, admission to hospital for various reasons, and risks of death.   Long term side-effects are also discussed including risks of infertility, permanent damage to nerve function, hearing loss, chronic fatigue, kidney damage with possibility needing hemodialysis, and rare secondary malignancy including bone marrow disorders.  The patient is aware that the response rates discussed earlier is not guaranteed.  After a long discussion, patient made an informed decision to proceed with the prescribed plan of care.   Patient education material was dispensed. We discussed premedication with dexamethasone before chemotherapy. 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  Metastasis to lymph nodes (HCC) The lymph node is not palpable on exam We will follow with serial imaging study after 3 cycles of treatment  Pulmonary nodules/lesions, multiple The lung nodules have been present for many years It is likely benign Observe only  CKD (chronic kidney disease), stage III ([place]) She has borderline chronic kidney disease stage III I plan reduced dose carboplatin and AUC of 5 and paclitaxel at 135 mg/m  Goals of care, counseling/discussion We discussed the goals of care She have stage IV recurrent, metastatic disease Treatment goal is palliative but due to low disease burden, she can still achieve complete response with aggressive treatment My goal, if her pathology report showed HER-2 negative disease, would be to consider adding bevacizumab after we document response to treatment at cycle 4 and beyond that and to continue on maintenance bevacizumab long-term She will bring a copy of her advance directive in her next visit The patient has expressed desire to be DNR She appointed her daughter as her healthcare power of attorney   Orders Placed This Encounter  Procedures  . IR IMAGING GUIDED PORT INSERTION    Standing Status:   Future    Standing Expiration Date:   12/14/2019    Order Specific Question:   Reason for Exam (SYMPTOM  OR DIAGNOSIS REQUIRED)    Answer:   need port for chemo 4/8    Order Specific Question:   Preferred Imaging Location?    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 female is here because of because of recent diagnosis of recurrent, metastatic uterine cancer. "Vicki Cervantes" is here by herself but her daughter-in-law, Vicki Cervantes and her daughter, Vicki Cervantes were listening to the conversation via phone  conference She had diagnosis of stage I breast cancer at age of 38 and underwent bilateral mastectomy without adjuvant treatment She had genetic testing done in the past that came back negative She was diagnosed with postmenopausal bleeding in 2018 and underwent evaluation and surgery Due to early stage disease, she did not receive adjuvant treatment With her most recent follow-up, she had abnormal vaginal lesion, biopsy proven to be recurrent uterine cancer. PET CT scan reviewed evidence of lymph node metastasis, biopsy-proven to be metastatic uterine cancer as well She is being referred here for chemotherapy  I have reviewed her chart and materials related to her cancer extensively and collaborated history with the patient. Summary of oncologic history is as follows: Oncology History   Vicki Cervantes  has a remote history of left  breast cancer at age 31 but received BRCA testing approximately 5 years ago which was negative. Her cancer was treated with surgery but no radiation or chemotherapy.   Serous endometrial cancer, MSI stable       Endometrial cancer ([place])   08/29/2016 Pathology Results    Endometrium, biopsy - HIGH GRADE ENDOMETRIAL CARCINOMA, SEE COMMENT. Microscopic Comment The sections show multiple fragments of adenocarcinoma displaying glandular and papillary patterns associated with high grade cytomorphology characterized by nuclear pleomorphism, prominent nucleoli and brisk mitosis. Immunohistochemical stains show that the tumor cells are positive for vimentin, p16, p53 with increased Ki-67 expression. Estrogen and progesterone receptor stains show patchy weak positivity. No significant positivity is seen with CEA. The findings are consistent with high grade endometrial carcinoma and the overall morphology and phenotypic features favor serous carcinoma.    08/29/2016 Initial Diagnosis    She presented with postmenopausal bleeding    10/03/2016 Imaging    CT C/A/P  09/2016 IMPRESSION: 1. Thickening of the endometrial canal up to 19 mm in fundus, presumably corresponding to the patient's reported endometrial carcinoma. 2. Multiple tiny pulmonary nodules scattered throughout the lungs bilaterally measuring 4 mm or less in size. Nodules of this size are typically considered statistically likely benign. In the setting of known primary malignancy, metastatic disease to the lungs is not excluded, but is not strongly favored on today's examination. Attention on followup studies is recommended to ensure the stability or resolution of these nodules. 3. Subcentimeter low-attenuation lesion in the central aspect of segment 8 of the liver is too small to characterize. This is statistically likely a tiny cyst, but warrants attention on follow-up studies to exclude the possibility of a solitary hepatic metastasis. 4. 1.5 x 1.5 x 1.7 cm well-circumscribed lesion in the proximal stomach. This is of uncertain etiology and significance, and could represent a benign gastric polyp, however, further evaluation with nonemergent endoscopy is suggested in the near future for further evaluation. 5. **An incidental finding of potential clinical significance has been found. 1.1 x 1.6 cm thyroid nodule in the inferior aspect of the right lobe of the thyroid gland. Follow-up evaluation with nonemergent thyroid ultrasound is recommended in the near future to better evaluate this finding. This recommendation follows ACR consensuss guidelines: Managing Incidental Thyroid Nodules Detected on Imaging: White Paper of the ACR Incidental Thyroid Findings Committee. J Am Coll Radiol 2015;12(2):143-150.** 6. Aortic atherosclerosis, in addition to left anterior descending coronary  artery disease    10/22/2016 Surgery    Robotic assisted total hysterectomy, BSO and bilateral pelvic lymphadenectomy  Final pathology revealed a 3cm polyp containing serous carcinoma but with no myometrial invasion, no LVSI and  negative nodes.  Stage IA Uterine serous cancer    10/22/2016 Pathology Results    1. Lymph nodes, regional resection, right pelvic - SIX BENIGN LYMPH NODES (0/6). 2. Lymph nodes, regional resection, left pelvic - SEVEN BENIGN LYMPH NODES (0/7). 3. Uterus +/- tubes/ovaries, neoplastic, with right ovary and fallopian tube ENDOMYOMETRIUM - SEROUS CARCINOMA ARISING WITHIN AN ENDOMETRIAL POLYP - NO MYOMETRIAL INVASION IDENTIFIED - ADENOMYOSIS - LEIOMYOMA (1 CM) - SEE ONCOLOGY TABLE AND COMMENT CERVIX - CARCINOMA FOCALLY INVOLVES ENDOCERVICAL GLANDS - NABOTHIAN CYSTS RIGHT ADNEXA - BENIGN OVARY AND FALLOPIAN TUBE - NO CARCINOMA IDENTIFIED 4. Cul-de-sac biopsy - MESOTHELIAL HYPERPLASIA Microscopic Comment 3. ONCOLOGY TABLE-UTERUS, CARCINOMA OR CARCINOSARCOMA Specimen: Uterus, right fallopian tube and ovary Procedure: Total hysterectomy and right salpingo-oophorectomy Lymph node sampling performed: Bilateral pelvic regional resection Specimen integrity: Intact Maximum tumor size: 3 cm (polyp) Histologic type: Serous carcinoma Grade: High grade Myometrial invasion: Not identified Cervical stromal involvement: No, focal endocervical gland involvement Extent of involvement of other organs: Not identified Lymph - vascular invasion: Not identified Peritoneal washings: N/A Lymph nodes: Examined: 0 Sentinel 13 Non-sentinel 13 Total Lymph nodes with metastasis: 0 Isolated tumor cells (< 0.2 mm): 0 Micrometastasis: (> 0.2 mm and < 2.0 mm): 0 Macrometastasis: (> 2.0 mm): 0 Extracapsular extension: N/A Pelvic lymph nodes: 0 involved of 13 lymph nodes. Para-aortic lymph nodes: No para-aortic nodes submitted TNM code: pT1a, pNX FIGO Stage (based on pathologic findings, needs clinical correlation): IA Comment: Immunohistochemistry for cytokeratin AE1/AE3 is performed on all of the lymph nodes (parts 1 & 2) and no metastatic carcinoma is identified.    07/04/2017 Genetic Testing    The  patient had genetic testing due to a personal history of breast and uterine cancer, and a family history of stomach cancer.  The Multi-Cancer Panel was ordered. The Multi-Cancer Panel offered by [facility] includes sequencing and/or deletion duplication testing of the following 83 genes: ALK, APC, ATM, AXIN2,BAP1,  BARD1, BLM, BMPR1A, BRCA1, BRCA2, BRIP1, CASR, CDC73, CDH1, CDK4, CDKN1B, CDKN1C, CDKN2A (p14ARF), CDKN2A (p16INK4a), CEBPA, CHEK2, CTNNA1, DICER1, DIS3L2, EGFR (c.2369C>T, p.Thr790Met variant only), EPCAM (Deletion/duplication testing only), FH, FLCN, GATA2, GPC3, GREM1 (Promoter region deletion/duplication testing only), HOXB13 (c.251G>A, p.Gly84Glu), HRAS, KIT, MAX, MEN1, MET, MITF (c.952G>A, p.Glu318Lys variant only), MLH1, MSH2, MSH3, MSH6, MUTYH, NBN, NF1, NF2, NTHL1, PALB2, PDGFRA, PHOX2B, PMS2, POLD1, POLE, POT1, PRKAR1A, PTCH1, PTEN, RAD50, RAD51C, RAD51D, RB1, RECQL4, RET, RUNX1, SDHAF2, SDHA (sequence changes only), SDHB, SDHC, SDHD, SMAD4, SMARCA4, SMARCB1, SMARCE1, STK11, SUFU, TERC, TERT, TMEM127, TP53, TSC1, TSC2, VHL, WRN and WT1.   Results: No pathogenic variants were identified.  A variant of uncertain significance in the gene APC was identified.  c.791A>G (p.Gln264Arg).  The date of this test report is 07/04/2017.      Genetic Testing    Patient has genetic testing done for MSI. 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     12/03/2017 Imaging    CT Chest/Abd/Pelvis to follow pulmonary nodule and gastric mass IMPRESSION: 1. Stable CT of the chest. Small pulmonary nodules are unchanged when compared with previous exam. 2. No new findings identified. 3. Subcentimeter low-attenuation lesions within the liver are remain too small to characterize but are stable from prior exam. 4. Persistent indeterminate low-attenuation structure within the  proximal stomach is unchanged measuring 1.4 cm. Correlation with direct visualization is advised     06/30/2018 Imaging    CT CHEST Lungs/Pleura: Stable scattered sub-cm pulmonary nodules are again seen bilaterally and are stable compared to previous studies. No new or enlarging pulmonary nodules or masses identified. No evidence of pulmonary infiltrate or pleural effusion.    09/17/2018 Relapse/Recurrence    Presented with c/o vagina discharge.  Lesion noted at the left vaginal apex 24m lesion removed Path c/w high grade serous cancer    09/17/2018 Pathology Results    Vagina, biopsy, left apex - HIGH GRADE SEROUS CARCINOMA.    09/28/2018 PET scan    1. Two hypermetabolic axial lymph nodes. Unusual site for metastatic endometrial carcinoma however the activity is more intense than typically seen in reactive adenopathy. Suggest ultrasound-guided percutaneous biopsy of the larger RIGHT axial lymph node 2. No evidence of local recurrence at the vaginal cuff.  3. No metastatic adenopathy in the abdomen or pelvis. 4. Stable small pulmonary nodules    10/09/2018 Pathology Results    Lymph node, needle/core biopsy, right axilla - METASTATIC CARCINOMA, SEE COMMENT. Microscopic Comment The carcinoma appears high grade. Immunohistochemistry is positive for cytokeratin 7, PAX-8, and ER. Cytokeratin 20, CDX-2, PR, and GATA-3 are negative. The findings along with the history are consistent with a gynecologic primary.     10/09/2018 Procedure    Ultrasound-guided core biopsies of a right axillary lymph node.    10/14/2018 Cancer Staging    Staging form: Corpus Uteri - Carcinoma and Carcinosarcoma, AJCC 8th Edition - Clinical: Stage IVB (cT1, cN0, pM1) - Signed by GHeath Lark MD on 10/14/2018     [place]asis [place] lymph nodes [place]   10/14/2018 Initial Diagnosis    [place]asis [place] lymph nodes ([place]    10/21/2018 -  Chemotherapy    The patient had PALONOSETRON HCL INJECTION 0.25 MG/5ML, 0.25 mg, Intravenous,  Once, 0 of 6 cycles pegfilgrastim-cbqv (UDENYCA) injection 6 mg, 6 mg, Subcutaneous, Once, 0 of 6  cycles CARBOplatin (PARAPLATIN) 310 mg in sodium chloride 0.9 % 100 mL chemo infusion, , Intravenous,  Once, 0 of 6 cycles PACLitaxel (TAXOL) 222 mg in sodium chloride 0.9 % 250 mL chemo infusion (> 861mm2), 135 mg/m2, Intravenous,  Once, 0 of 6 cycles FOSAPREPITANT 150MG + DEXAMETHASONE INFUSION [facility], , Intravenous,  Once, 0 of 6 cycles  for chemotherapy treatment.      MEDICAL HISTORY:  Past Medical History:  Diagnosis Date  . Aortic atherosclerosis ([place]  . Arthritis   . Blood transfusion without reported diagnosis   . Breast cancer ([place]    LEFT, '82-left breast cancer-surgery only  . Cataract    removed both eyes  . Complication of anesthesia    nausea  . Diverticulosis   . Esophageal stricture   . Family history of stomach cancer   . GERD (gastroesophageal reflux disease)   . Hemorrhoids   . Hiatal hernia   . High triglycerides   . Hypercholesteremia   . Hypertension   . Hypothyroidism   . Osteoarthritis   . PONV (postoperative nausea and vomiting)   . [facility] ([facility]    SURGICAL HISTORY: Past Surgical History:  Procedure Laterality Date  . COLONOSCOPY    . EUS N/A 01/07/2013   Procedure: UPPER ENDOSCOPIC ULTRASOUND (EUS) LINEAR;  Surgeon: DaMilus BanisterMD;  Location: [facility] ENDOSCOPY;  Service: Endoscopy;  Laterality: N/A;  . EUS N/A 01/05/2015   Procedure: UPPER ENDOSCOPIC ULTRASOUND (EUS)  LINEAR;  Surgeon: Milus Banister, MD;  Location: [facility];  Service: Endoscopy;  Laterality: N/A;  . FOOT SURGERY Left 2009-2010   x2. Surgery in 2011  . LEFT HEART CATHETERIZATION WITH CORONARY ANGIOGRAM N/A 01/24/2014   Procedure: LEFT HEART CATHETERIZATION WITH CORONARY ANGIOGRAM;  Surgeon: Peter M Martinique, MD;  Location[facility] C[facility] LAB;  Service: Cardiovascular;  Laterality: N/A;  . LUMBAR [place] SURGERY  01-01-13  . MASTECTOMY, RADICAL Left 1982  . OVARY SURGERY  1972-1973  . ROBOTIC ASSISTED TOTAL HYSTERECTOMY WITH BILATERAL SALPINGO OOPHERECTOMY N/A 10/22/2016    Procedure: XI ROBOTIC ASSISTED TOTAL HYSTERECTOMY WITH RIGHT SALPINGO OOPHORECTOMY; INJECTION OF GREEN DYE WITH EXCISION OF BILATERAL PELVIC LYMPH NODES;  Surgeon: Janie Morning, MD;  Location: [facility] ORS;  Service: Gynecology;  Laterality: N/A;  . ROTATOR CUFF REPAIR Left   . SENTINEL NODE BIOPSY N/A 10/22/2016   Procedure: SENTINEL NODE BIOPSY;  Surgeon: Janie Morning, MD;  Location: [facility] ORS;  Service: Gynecology;  Laterality: N/A;  . SIMPLE MASTECTOMY Left 1982  . TOTAL KNEE ARTHROPLASTY Bilateral 2009-2010   x2  . UPPER GASTROINTESTINAL ENDOSCOPY      SOCIAL HISTORY: Social History   Socioeconomic History  . Marital status: Widowed    Spouse name: Not on file  . Number of children: 3  . Years of education: Not on file  . Highest education level: Not on file  Occupational History  . 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 Financial resource strain: Not on file  . Food insecurity:    Worry: Not on file    Inability: Not on file  . Transportation needs:    Medical: Not on file    Non-medical: Not on file  Tobacco Use  . Smoking status: Never Smoker  . Smokeless tobacco: Never Used  Substance and Sexual Activity  . Alcohol use: Yes    Alcohol/week: 3.0 standard drinks    Types: 3 Glasses of wine per week    Comment: Wine Occas.  . Drug use: No  . Sexual activity: Never  Lifestyle  . Physical activity:    Days per week: Not on file    Minutes per session: Not on file  . Stress: Not on file  Relationships  . Social connections:    Talks on phone: Not on file    Gets together: Not on file    Attends religious service: Not on file    Active member of club or organization: Not on file    Attends meetings of clubs or organizations: Not on file    Relationship status: Not on file  . Intimate partner violence:    Fear of current or ex partner: Not on file    Emotionally abused: Not on file    Physically abused: Not on file     Forced sexual activity: Not on file  Other Topics Concern  . Not on file  Social History Narrative  . Not on file    FAMILY HISTORY: Family History  Problem Relation Age of Onset  . Heart disease Mother   . Other Mother        blood disease  . Heart attack Father   . Colon polyps Sister   . Heart attack Brother   . Colon polyps Brother   . Stomach cancer Maternal Aunt   . Colon cancer Neg Hx   . Esophageal cancer Neg Hx   . Rectal cancer Neg Hx   .  Pancreatic cancer Neg Hx     ALLERGIES:  has No Known Allergies.  MEDICATIONS:  Current Outpatient Medications  Medication Sig Dispense Refill  . acetaminophen (TYLENOL) 500 MG tablet Take 1,000 mg by mouth every 6 (six) hours as needed.    Marland Kitchen aspirin EC 81 MG tablet Take 81 mg by mouth daily.    Marland Kitchen atorvastatin (LIPITOR) 20 MG tablet Take 20 mg by mouth every evening.     . Calcium Carbonate-Vitamin D (CALCIUM-VITAMIN D) 500-200 MG-UNIT per tablet Take 1 tablet by mouth daily.    . citalopram (CELEXA) 20 MG tablet Take 20 mg by mouth at bedtime.     Marland Kitchen dexamethasone (DECADRON) 4 MG tablet Take 2 tabs at the night before and 2 tabs the morning of chemotherapy, every 3 weeks, by mouth 24 tablet 0  . levothyroxine (SYNTHROID, LEVOTHROID) 100 MCG tablet Take 100 mcg by mouth daily before breakfast.     . lidocaine-prilocaine (EMLA) cream Apply to affected area once 30 g 3  . losartan-hydrochlorothiazide (HYZAAR) 100-25 MG tablet     . metoprolol succinate (TOPROL-XL) 25 MG 24 hr tablet Take 25 mg by mouth daily.    . Multiple Vitamin (MULTIVITAMIN WITH MINERALS) TABS Take 1 tablet by mouth daily.    . ondansetron (ZOFRAN) 8 MG tablet Take 1 tablet (8 mg total) by mouth every 8 (eight) hours as needed. Start on the third day after chemotherapy. 30 tablet 1  . pantoprazole (PROTONIX) 40 MG tablet Take 40 mg by mouth every morning.     . prochlorperazine (COMPAZINE) 10 MG tablet Take 1 tablet (10 mg total) by mouth every 6 (six) hours as  needed (Nausea or vomiting). 30 tablet 1  . valACYclovir (VALTREX) 1000 MG tablet valacyclovir 1 gram tablet     No current facility-administered medications for this visit.     REVIEW OF SYSTEMS:   Constitutional: Denies fevers, chills or abnormal night sweats Eyes: Denies blurriness of vision, double vision or watery eyes Ears, nose, mouth, throat, and face: Denies mucositis or sore throat Respiratory: Denies cough, dyspnea or wheezes Cardiovascular: Denies palpitation, chest discomfort or lower extremity swelling Gastrointestinal:  Denies nausea, heartburn or change in bowel habits Skin: Denies abnormal skin rashes Lymphatics: Denies new lymphadenopathy or easy bruising Neurological:Denies numbness, tingling or new weaknesses Behavioral/Psych: Mood is stable, no new changes  All other systems were reviewed with the patient and are negative.  PHYSICAL EXAMINATION: ECOG PERFORMANCE STATUS: 0 - Asymptomatic  Vitals:   10/14/18 1008  BP: (!) 143/73  Cervantes: 75  Resp: 18  Temp: 98.2 F (36.8 C)  SpO2: 96%   Filed Weights   10/14/18 1008  Weight: 135 lb 3.2 oz (61.3 kg)    GENERAL:alert, no distress and comfortable SKIN: skin color, texture, turgor are normal, no rashes or significant lesions.  Noted skin discoloration from recent biopsy near the axilla EYES: normal, conjunctiva are pink and non-injected, sclera clear OROPHARYNX:no exudate, no erythema and lips, buccal mucosa, and tongue normal  NECK: supple, thyroid normal size, non-tender, without nodularity LYMPH:  no palpable lymphadenopathy in the cervical, axillary or inguinal LUNGS: clear to auscultation and percussion with normal breathing effort HEART: regular rate & rhythm and no murmurs and no lower extremity edema ABDOMEN:abdomen soft, non-tender and normal bowel sounds Musculoskeletal:no cyanosis of digits and no clubbing  PSYCH: alert & oriented x 3 with fluent speech NEURO: no focal motor/sensory  deficits  LABORATORY DATA:  I have reviewed the data as listed  Lab Results  Component Value Date   WBC 7.6 10/14/2018   HGB 12.1 10/14/2018   HCT 35.2 (L) 10/14/2018   MCV 90.7 10/14/2018   PLT 227 10/14/2018   Recent Labs    11/25/17 1300 06/30/18 1011 10/09/18 1159  NA 140 141 138  K 3.6 3.9 3.3*  CL 103 105 100  CO2 '28 27 25  ' GLUCOSE 103 113* 106*  BUN 22 24* 26*  CREATININE 1.27* 1.30* 1.20*  CALCIUM 10.0 9.2 9.3  GFRNONAA 40* 39* 43*  GFRAA 46* 46* 50*  PROT 7.3  --   --   ALBUMIN 4.1  --   --   AST 25  --   --   ALT 23  --   --   ALKPHOS 83  --   --   BILITOT 0.4  --   --     RADIOGRAPHIC STUDIES: I have personally reviewed the radiological images as listed and agreed with the findings in the report. Nm Pet Image Initial (pi) Skull Base To Thigh  Result Date: 09/28/2018 CLINICAL DATA:  Initial treatment strategy for endometrial carcinoma. History of endometrial carcinoma status post hysterectomy. Recent vaginal biopsy (09/17/18) revealed high-grade serous carcinoma consistent recurrence. EXAM: NUCLEAR MEDICINE PET SKULL BASE TO THIGH TECHNIQUE: 6.7 mCi F-18 FDG was injected intravenously. Full-ring PET imaging was performed from the skull base to thigh after the radiotracer. CT data was obtained and used for attenuation correction and anatomic localization. Fasting blood glucose: 114 mg/dl COMPARISON:  06/30/2018, CT chest FINDINGS: Mediastinal blood pool activity: SUV max 2.5 NECK: No hypermetabolic lymph nodes in the neck. Incidental CT findings: none CHEST: Two RIGHT axillary lymph nodes have focal hypermetabolic activity. The more superior node measures 10 mm (image 57/4) with SUV max equal 7.9. This node is similar size to 9 mm on CT 12/03/2017 and increased in size from 2018. More inferior node measures 6 mm (image 63/4) with SUV max equal 3.0. This a smaller node is increased in size from 3 mm on 12/03/2017. Both nodes similar to more recent CT 06/30/2018 No  additional hypermetabolic mediastinal hilar nodes. Incidental CT findings: A small 3 mm RIGHT upper lobe nodule (image 23/3) unchanged. Similar 3 mm nodule in the superior aspect LEFT lower lobe (image 25/8 unchanged. ABDOMEN/PELVIS: No abnormal hypermetabolic activity within the liver, pancreas, adrenal glands, or spleen. No hypermetabolic lymph nodes in the abdomen or pelvis. No hypermetabolic activity within the vaginal cuff. No hypermetabolic pelvic lymph nodes. Incidental CT findings: Post hysterectomy.  Sigmoid diverticulosis. SKELETON: No focal hypermetabolic activity to suggest skeletal metastasis. Incidental CT findings: none IMPRESSION: 1. Two hypermetabolic axial lymph nodes. Unusual site for metastatic endometrial carcinoma however the activity is more intense than typically seen in reactive adenopathy. Suggest ultrasound-guided percutaneous biopsy of the larger RIGHT axial lymph node 2. No evidence of local recurrence at the vaginal cuff. 3. No metastatic adenopathy in the abdomen or pelvis. 4. Stable small pulmonary nodules Electronically Signed   By: Suzy Bouchard M.D.   On: 09/28/2018 14:23   [place] Core Biopsy (lymph Nodes)  Result Date: 10/09/2018 INDICATION: 78 year old with history of endometrial cancer. Suspicious right axillary lymph nodes on recent PET-CT. EXAM: ULTRASOUND-GUIDED RIGHT AXILLARY LYMPH NODE BIOPSY MEDICATIONS: None. ANESTHESIA/SEDATION: Moderate (conscious) sedation was employed during this procedure. A total of Versed 2.0 mg and Fentanyl 50 mcg was administered intravenously. Moderate Sedation Time: 17 minutes. The patient's level of consciousness and vital signs were monitored continuously by radiology nursing throughout the procedure  under my direct supervision. FLUOROSCOPY TIME:  None COMPLICATIONS: None immediate. PROCEDURE: Informed written consent was obtained from the patient after a thorough discussion of the procedural risks, benefits and alternatives. All questions  were addressed. A timeout was performed prior to the initiation of the procedure. Right axilla was evaluated with ultrasound. Two hypoechoic lymph nodes were identified that correspond with the hypermetabolic lymph nodes on recent PET-CT. The largest lymph node was targeted for biopsy. Right axilla was prepped with chlorhexidine and sterile field was created. Skin was anesthetized with 1% lidocaine. Using ultrasound guidance, 18 gauge core needle was directed into the largest right axillary lymph node. Total of 7 core biopsies were obtained. Specimens placed in saline. Bandage placed over the puncture site. FINDINGS: Hypoechoic lymph nodes in the right axilla. Largest lymph node measures 1.5 x 0.7 x 1.4 cm. This lymph node corresponds with hypermetabolic activity on recent PET-CT. Core biopsy needle was confirmed within the lymph node. No significant bleeding or hematoma formation. IMPRESSION: Ultrasound-guided core biopsies of a right axillary lymph node. Electronically Signed   By: Markus Daft M.D.   On: 10/09/2018 14:47    I spent 60 minutes counseling the patient face to face. The total time spent in the appointment was 80 minutes and more than 50% was on counseling.  All questions were answered. The patient knows to call the clinic with any problems, questions or concerns.  [name], MD 10/14/2018 11:44 AM

## 2018-10-14 NOTE — Telephone Encounter (Signed)
No 4/1 los °

## 2018-10-15 ENCOUNTER — Telehealth: Payer: Self-pay | Admitting: Hematology and Oncology

## 2018-10-15 ENCOUNTER — Other Ambulatory Visit: Payer: Self-pay | Admitting: Hematology and Oncology

## 2018-10-15 NOTE — Telephone Encounter (Signed)
Scheduled appt per 4/1 sch message- unable to reach patient - left message with appt date and time

## 2018-10-16 ENCOUNTER — Other Ambulatory Visit: Payer: Self-pay | Admitting: Physician Assistant

## 2018-10-19 ENCOUNTER — Other Ambulatory Visit: Payer: Self-pay

## 2018-10-19 ENCOUNTER — Ambulatory Visit (HOSPITAL_COMMUNITY)
Admission: RE | Admit: 2018-10-19 | Discharge: 2018-10-19 | Disposition: A | Discharge status: Home / Self Care | Payer: Medicare HMO | Source: Ambulatory Visit | Attending: Hematology and Oncology | Admitting: Hematology and Oncology

## 2018-10-19 ENCOUNTER — Encounter (HOSPITAL_COMMUNITY): Payer: Self-pay

## 2018-10-19 DIAGNOSIS — Z800 Family history of malignant neoplasm of digestive organs: Secondary | ICD-10-CM | POA: Insufficient documentation

## 2018-10-19 DIAGNOSIS — E7800 Pure hypercholesterolemia, unspecified: Secondary | ICD-10-CM | POA: Insufficient documentation

## 2018-10-19 DIAGNOSIS — Z452 Encounter for adjustment and management of vascular access device: Secondary | ICD-10-CM | POA: Diagnosis not present

## 2018-10-19 DIAGNOSIS — Z853 Personal history of malignant neoplasm of breast: Secondary | ICD-10-CM | POA: Insufficient documentation

## 2018-10-19 DIAGNOSIS — Z79899 Other long term (current) drug therapy: Secondary | ICD-10-CM | POA: Diagnosis not present

## 2018-10-19 DIAGNOSIS — M1990 Unspecified osteoarthritis, unspecified site: Secondary | ICD-10-CM | POA: Diagnosis not present

## 2018-10-19 DIAGNOSIS — C773 Secondary and unspecified malignant neoplasm of axilla and upper limb lymph nodes: Secondary | ICD-10-CM | POA: Diagnosis not present

## 2018-10-19 DIAGNOSIS — K219 Gastro-esophageal reflux disease without esophagitis: Secondary | ICD-10-CM | POA: Insufficient documentation

## 2018-10-19 DIAGNOSIS — Z90710 Acquired absence of both cervix and uterus: Secondary | ICD-10-CM | POA: Diagnosis not present

## 2018-10-19 DIAGNOSIS — I10 Essential (primary) hypertension: Secondary | ICD-10-CM | POA: Insufficient documentation

## 2018-10-19 DIAGNOSIS — K449 Diaphragmatic hernia without obstruction or gangrene: Secondary | ICD-10-CM | POA: Insufficient documentation

## 2018-10-19 DIAGNOSIS — E039 Hypothyroidism, unspecified: Secondary | ICD-10-CM | POA: Insufficient documentation

## 2018-10-19 DIAGNOSIS — C541 Malignant neoplasm of endometrium: Secondary | ICD-10-CM | POA: Diagnosis not present

## 2018-10-19 DIAGNOSIS — Z7982 Long term (current) use of aspirin: Secondary | ICD-10-CM | POA: Diagnosis not present

## 2018-10-19 HISTORY — PX: IR IMAGING GUIDED PORT INSERTION: IMG5740

## 2018-10-19 LAB — CBC
HCT: 33.2 % — ABNORMAL LOW (ref 36.0–46.0)
Hemoglobin: 11.3 g/dL — ABNORMAL LOW (ref 12.0–15.0)
MCH: 31 pg (ref 26.0–34.0)
MCHC: 34 g/dL (ref 30.0–36.0)
MCV: 91.2 fL (ref 80.0–100.0)
Platelets: 217 10*3/uL (ref 150–400)
RBC: 3.64 MIL/uL — ABNORMAL LOW (ref 3.87–5.11)
RDW: 11.9 % (ref 11.5–15.5)
WBC: 5.8 10*3/uL (ref 4.0–10.5)
nRBC: 0 % (ref 0.0–0.2)

## 2018-10-19 LAB — PROTIME-INR
INR: 1 (ref 0.8–1.2)
Prothrombin Time: 13.4 seconds (ref 11.4–15.2)

## 2018-10-19 MED ORDER — FENTANYL CITRATE (PF) 100 MCG/2ML IJ SOLN
INTRAMUSCULAR | Status: AC | PRN
  Administered 2018-10-19 (×2): 25 ug via INTRAVENOUS
  Administered 2018-10-19: 50 ug via INTRAVENOUS

## 2018-10-19 MED ORDER — FENTANYL CITRATE (PF) 100 MCG/2ML IJ SOLN
INTRAMUSCULAR | Status: AC
  Filled 2018-10-19: qty 4

## 2018-10-19 MED ORDER — MIDAZOLAM HCL 2 MG/2ML IJ SOLN
INTRAMUSCULAR | Status: AC
  Filled 2018-10-19: qty 4

## 2018-10-19 MED ORDER — CEFAZOLIN SODIUM-DEXTROSE 2-4 GM/100ML-% IV SOLN
INTRAVENOUS | Status: AC
  Filled 2018-10-19: qty 100

## 2018-10-19 MED ORDER — MIDAZOLAM HCL 2 MG/2ML IJ SOLN
INTRAMUSCULAR | Status: AC | PRN
  Administered 2018-10-19 (×3): 1 mg via INTRAVENOUS

## 2018-10-19 MED ORDER — LIDOCAINE HCL 1 % IJ SOLN
INTRAMUSCULAR | Status: AC
  Filled 2018-10-19: qty 20

## 2018-10-19 MED ORDER — HEPARIN SOD (PORK) LOCK FLUSH 100 UNIT/ML IV SOLN
INTRAVENOUS | Status: AC
  Filled 2018-10-19: qty 5

## 2018-10-19 MED ORDER — CEFAZOLIN SODIUM-DEXTROSE 2-4 GM/100ML-% IV SOLN
2.0000 g | Freq: Once | INTRAVENOUS | Status: AC
  Administered 2018-10-19: 2 g via INTRAVENOUS

## 2018-10-19 MED ORDER — SODIUM CHLORIDE 0.9 % IV SOLN
INTRAVENOUS | Status: DC
  Administered 2018-10-19 (×2): via INTRAVENOUS

## 2018-10-19 MED ORDER — LIDOCAINE HCL 1 % IJ SOLN
INTRAMUSCULAR | Status: AC | PRN
  Administered 2018-10-19: 10 mL

## 2018-10-19 NOTE — Discharge Instructions (Signed)
Moderate Conscious Sedation, Adult, Care After These instructions provide you with information about caring for yourself after your procedure. Your health care provider may also give you more specific instructions. Your treatment has been planned according to current medical practices, but problems sometimes occur. Call your health care provider if you have any problems or questions after your procedure. What can I expect after the procedure? After your procedure, it is common:  To feel sleepy for several hours.  To feel clumsy and have poor balance for several hours.  To have poor judgment for several hours.  To vomit if you eat too soon. Follow these instructions at home: For at least 24 hours after the procedure:   Do not: ? Participate in activities where you could fall or become injured. ? Drive. ? Use heavy machinery. ? Drink alcohol. ? Take sleeping pills or medicines that cause drowsiness. ? Make important decisions or sign legal documents. ? Take care of children on your own.  Rest. Eating and drinking  Follow the diet recommended by your health care provider.  If you vomit: ? Drink water, juice, or soup when you can drink without vomiting. ? Make sure you have little or no nausea before eating solid foods. General instructions  Have a responsible adult stay with you until you are awake and alert.  Take over-the-counter and prescription medicines only as told by your health care provider.  If you smoke, do not smoke without supervision.  Keep all follow-up visits as told by your health care provider. This is important. Contact a health care provider if:  You keep feeling nauseous or you keep vomiting.  You feel light-headed.  You develop a rash.  You have a fever. Get help right away if:  You have trouble breathing. This information is not intended to replace advice given to you by your health care provider. Make sure you discuss any questions you have  with your health care provider. Document Released: 04/21/2013 Document Revised: 12/04/2015 Document Reviewed: 10/21/2015 Elsevier Interactive Patient Education  2019 [place] may shower and remove your dressing tomorrow.  DO NOT use EMLA cream for 2 weeks after port placement as this cream will remove the surgical glue on your incision.   Implanted Port Insertion, Care After This sheet gives you information about how to care for yourself after your procedure. Your health care provider may also give you more specific instructions. If you have problems or questions, contact your health care provider. What can I expect after the procedure? After the procedure, it is common to have:  Discomfort at the port insertion site.  Bruising on the skin over the port. This should improve over 3-4 days. Follow these instructions at home: [facility] care  After your port is placed, you will get a manufacturer's information card. The card has information about your port. Keep this card with you at all times.  Take care of the port as told by your health care provider. Ask your health care provider if you or a family member can get training for taking care of the port at home. A home health care nurse may also take care of the port.  Make sure to remember what type of port you have. Incision care      Follow instructions from your health care provider about how to take care of your port insertion site. Make sure you: ? Wash your hands with soap and water before and after you change your bandage (dressing).  If soap and water are not available, use hand sanitizer. ? Change your dressing as told by your health care provider. ? Leave stitches (sutures), skin glue, or adhesive strips in place. These skin closures may need to stay in place for 2 weeks or longer. If adhesive strip edges start to loosen and curl up, you may trim the loose edges. Do not remove adhesive strips completely unless your  health care provider tells you to do that.  Check your port insertion site every day for signs of infection. Check for: ? Redness, swelling, or pain. ? Fluid or blood. ? Warmth. ? Pus or a bad smell. Activity  Return to your normal activities as told by your health care provider. Ask your health care provider what activities are safe for you.  Do not lift anything that is heavier than 10 lb (4.5 kg), or the limit that you are told, until your health care provider says that it is safe. General instructions  Take over-the-counter and prescription medicines only as told by your health care provider.  Do not take baths, swim, or use a hot tub until your health care provider approves. Ask your health care provider if you may take showers. You may only be allowed to take sponge baths.  Do not drive for 24 hours if you were given a sedative during your procedure.  Wear a medical alert bracelet in case of an emergency. This will tell any health care providers that you have a port.  Keep all follow-up visits as told by your health care provider. This is important. Contact a health care provider if:  You cannot flush your port with saline as directed, or you cannot draw blood from the port.  You have a fever or chills.  You have redness, swelling, or pain around your port insertion site.  You have fluid or blood coming from your port insertion site.  Your port insertion site feels warm to the touch.  You have pus or a bad smell coming from the port insertion site. Get help right away if:  You have chest pain or shortness of breath.  You have bleeding from your port that you cannot control. Summary  Take care of the port as told by your health care provider. Keep the manufacturer's information card with you at all times.  Change your dressing as told by your health care provider.  Contact a health care provider if you have a fever or chills or if you have redness, swelling, or  pain around your port insertion site.  Keep all follow-up visits as told by your health care provider. This information is not intended to replace advice given to you by your health care provider. Make sure you discuss any questions you have with your health care provider. Document Released: 04/21/2013 Document Revised: 01/27/2018 Document Reviewed: 01/27/2018 Elsevier Interactive Patient Education  [employer].

## 2018-10-19 NOTE — Procedures (Signed)
Interventional Radiology Procedure Note  Procedure: Single Lumen Power Port Placement    Access:  Right IJ vein.  Findings: Catheter tip positioned at SVC/RA junction. Port is ready for immediate use.   Complications: None  EBL: < 10 mL  Recommendations:  - Ok to shower in 24 hours - Do not submerge for 7 days - Routine line care   Kammi Hechler T. Stephonie Wilcoxen, M.D Pager:  [phone]   

## 2018-10-19 NOTE — H&P (Signed)
Referring Physician(s): [name]  Supervising Physician: Aletta Edouard  Patient Status:  [facility] OP  Chief Complaint:  "I'm getting a port a cath"  Subjective: Patient familiar to IR service from right axillary lymph node biopsy on 10/09/2018.  She has a remote history of left breast cancer and endometrial cancer diagnosed in 2018, status post total hysterectomy, BSO and bilateral pelvic lymphadenectomy.  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 She denies fever, headache, chest pain, dyspnea, cough, abdominal/back pain, nausea, vomiting or bleeding.  Past Medical History:  Diagnosis Date  . Aortic atherosclerosis ([place])   . Arthritis   . Blood transfusion without reported diagnosis   . Breast cancer ([place])     LEFT, '82-left breast cancer-surgery only  . Cataract    removed both eyes  . Complication of anesthesia    nausea  . Diverticulosis   . Esophageal stricture   . Family history of stomach cancer   . GERD (gastroesophageal reflux disease)   . Hemorrhoids   . Hiatal hernia   . High triglycerides   . Hypercholesteremia   . Hypertension   . Hypothyroidism   . Osteoarthritis   . PONV (postoperative nausea and vomiting)   . Uterine cancer [facility])    Past Surgical History:  Procedure Laterality Date  . COLONOSCOPY    . EUS N/A 01/07/2013   Procedure: UPPER ENDOSCOPIC ULTRASOUND (EUS) LINEAR;  Surgeon: Milus Banister, MD;  Location: [facility] ENDOSCOPY;  Service: Endoscopy;  Laterality: N/A;  . EUS N/A 01/05/2015   Procedure: UPPER ENDOSCOPIC ULTRASOUND (EUS) LINEAR;  Surgeon: Milus Banister, MD;  Location: [facility] ENDOSCOPY;  Service: Endoscopy;  Laterality: N/A;  . FOOT SURGERY Left 2009-2010   x2. Surgery in 2011  . LEFT HEART CATHETERIZATION WITH CORONARY ANGIOGRAM N/A 01/24/2014   Procedure: LEFT HEART CATHETERIZATION WITH CORONARY ANGIOGRAM;  Surgeon: Peter M Martinique, MD;   Location[facility] CATH LAB;  Service: Cardiovascular;  Laterality: N/A;  . LUMBAR [place] SURGERY  01-01-13  . MASTECTOMY, RADICAL Left 1982  . OVARY SURGERY  1972-1973  . ROBOTIC ASSISTED TOTAL HYSTERECTOMY WITH BILATERAL SALPINGO OOPHERECTOMY N/A 10/22/2016   Procedure: XI ROBOTIC ASSISTED TOTAL HYSTERECTOMY WITH RIGHT SALPINGO OOPHORECTOMY; INJECTION OF GREEN DYE WITH EXCISION OF BILATERAL PELVIC LYMPH NODES;  Surgeon: Janie Morning, MD;  Location: [facility] ORS;  Service: Gynecology;  Laterality: N/A;  . ROTATOR CUFF REPAIR Left   . SENTINEL NODE BIOPSY N/A 10/22/2016   Procedure: SENTINEL NODE BIOPSY;  Surgeon: Janie Morning, MD;  Location: [facility] ORS;  Service: Gynecology;  Laterality: N/A;  . SIMPLE MASTECTOMY Left 1982  . TOTAL KNEE ARTHROPLASTY Bilateral 2009-2010   x2  . UPPER GASTROINTESTINAL ENDOSCOPY         Allergies: Patient has no known allergies.  Medications: Prior to Admission medications   Medication Sig Start Date End Date Taking? Authorizing Provider  acetaminophen (TYLENOL) 500 MG tablet Take 1,000 mg by mouth every 6 (six) hours as needed.   Yes [provider]  aspirin EC 81 MG tablet Take 81 mg by mouth daily.   Yes [provider]  atorvastatin (LIPITOR) 20 MG tablet Take 20 mg by mouth every evening.    Yes [provider]  Calcium Carbonate-Vitamin D (CALCIUM-VITAMIN D) 500-200 MG-UNIT per tablet Take 1 tablet by mouth daily.   Yes [provider]  citalopram (CELEXA) 20 MG tablet Take 20 mg by mouth at bedtime.  Yes [provider]  dexamethasone (DECADRON) 4 MG tablet Take 2 tabs at the night before and 2 tabs the morning of chemotherapy, every 3 weeks, by mouth 10/14/18  Yes Gorsuch, Ni, MD  levothyroxine (SYNTHROID, LEVOTHROID) 100 MCG tablet Take 100 mcg by mouth daily before breakfast.    Yes [provider]  losartan-hydrochlorothiazide (HYZAAR) 100-25 MG tablet  08/11/17  Yes [provider]  metoprolol  succinate (TOPROL-XL) 25 MG 24 hr tablet Take 25 mg by mouth daily.   Yes [provider]  Multiple Vitamin (MULTIVITAMIN WITH MINERALS) TABS Take 1 tablet by mouth daily.   Yes [provider]  pantoprazole (PROTONIX) 40 MG tablet Take 40 mg by mouth every morning.    Yes [provider]  prochlorperazine (COMPAZINE) 10 MG tablet Take 1 tablet (10 mg total) by mouth every 6 (six) hours as needed (Nausea or vomiting). 10/14/18  Yes Gorsuch, Ernst Spell, MD  valACYclovir (VALTREX) 1000 MG tablet valacyclovir 1 gram tablet   Yes [provider]  lidocaine-prilocaine (EMLA) cream Apply to affected area once 10/14/18   [name], MD  ondansetron (ZOFRAN) 8 MG tablet Take 1 tablet (8 mg total) by mouth every 8 (eight) hours as needed. Start on the third day after chemotherapy. 10/14/18   [name], MD     Vital Signs: Blood pressure 151/76, heart rate 71, temperature 98.5, respirations 18, O2 sat 96% room air Ht 5' 2.5" (1.588 m)   Wt 135 lb 3.2 oz (61.3 kg)   BMI 24.33 kg/m   Physical Exam awake, alert.  Chest clear to auscultation bilaterally.  Heart with regular rate and rhythm.  Abdomen soft, positive bowel sounds, nontender.  No significant lower extremity edema.  Imaging: No results found.  Labs:  CBC: Recent Labs    11/25/17 1300 10/09/18 1159 10/14/18 1131 10/19/18 1230  WBC 7.0 5.8 7.6 5.8  HGB 12.3 11.8* 12.1 11.3*  HCT 35.7 34.5* 35.2* 33.2*  PLT 235 227 227 217    COAGS: Recent Labs    10/09/18 1159 10/19/18 1230  INR 1.0 1.0    BMP: Recent Labs    11/25/17 1300 06/30/18 1011 10/09/18 1159 10/14/18 1131  NA 140 141 138 141  K 3.6 3.9 3.3* 3.9  CL 103 105 100 102  CO2 28 27 25 27   GLUCOSE 103 113* 106* 110*  BUN 22 24* 26* 18  CALCIUM 10.0 9.2 9.3 9.6  CREATININE 1.27* 1.30* 1.20* 1.22*  GFRNONAA 40* 39* 43* 42*  GFRAA 46* 46* 50* 49*    LIVER FUNCTION TESTS: Recent Labs    11/25/17 1300 10/14/18 1131  BILITOT 0.4 0.3   AST 25 18  ALT 23 16  ALKPHOS 83 84  PROT 7.3 7.3  ALBUMIN 4.1 4.0    Assessment and Plan: Pt with history of metastatic endometrial cancer; presents today for Port-A-Cath placement for palliative chemotherapy.Risks and benefits of image guided port-a-catheter placement was discussed with the patient including, but not limited to bleeding, infection, pneumothorax, or fibrin sheath development and need for additional procedures.  All of the patient's questions were answered, patient is agreeable to proceed. Consent signed and in chart.     Electronically Signed: D. Rowe Robert, PA-C 10/19/2018, 1:38 PM   I spent a total of 25 minutes at the the patient's bedside AND on the patient's hospital floor or unit, greater than 50% of which was counseling/coordinating care for port a  cath placement

## 2018-10-20 ENCOUNTER — Telehealth: Payer: Self-pay | Admitting: Oncology

## 2018-10-20 ENCOUNTER — Other Ambulatory Visit: Payer: Self-pay | Admitting: Hematology and Oncology

## 2018-10-20 DIAGNOSIS — C541 Malignant neoplasm of endometrium: Secondary | ICD-10-CM

## 2018-10-20 DIAGNOSIS — Z5111 Encounter for antineoplastic chemotherapy: Secondary | ICD-10-CM | POA: Insufficient documentation

## 2018-10-20 NOTE — Telephone Encounter (Signed)
Called Vicki Cervantes and notified her of appointment for Echo tomorrow at 1 pm.  Advised her to check in at [facility] admitting after chemotherapy.  Also discussed that Dr. Alvy Bimler will see her on 11/10/18 before her next cycle of chemo.  She verbalized understanding and agreement.

## 2018-10-20 NOTE — Telephone Encounter (Signed)
Alvita called and thinks she had an echo done on 08/16/18 at [facility] in [place], [place].  She thinks she has the report and will bring it with her to chemotherapy tomorrow.  Advised her that we can fill out a form and request records from [facility] if it is not the correct report.  She verbalized agreement.

## 2018-10-20 NOTE — Telephone Encounter (Signed)
Called Vicki Cervantes and discussed Her2/Neu positive results and that Dr. Alvy Bimler will be adding [name] to her 2nd cycle of chemotherapy.  Also discussed that she will need an echocardiogram and that we will call her back with the appointment.  We will also schedule an appointment with Dr. Alvy Bimler next Tuesday.  She verbalized agreement and understanding.

## 2018-10-21 ENCOUNTER — Inpatient Hospital Stay: Payer: Medicare HMO

## 2018-10-21 ENCOUNTER — Other Ambulatory Visit: Payer: Self-pay

## 2018-10-21 ENCOUNTER — Other Ambulatory Visit (HOSPITAL_COMMUNITY): Payer: Medicare HMO

## 2018-10-21 ENCOUNTER — Encounter: Payer: Self-pay | Admitting: Hematology and Oncology

## 2018-10-21 VITALS — BP 165/81 | HR 62 | Temp 98.0°F | Resp 18

## 2018-10-21 DIAGNOSIS — C779 Secondary and unspecified malignant neoplasm of lymph node, unspecified: Secondary | ICD-10-CM | POA: Diagnosis not present

## 2018-10-21 DIAGNOSIS — Z853 Personal history of malignant neoplasm of breast: Secondary | ICD-10-CM | POA: Diagnosis not present

## 2018-10-21 DIAGNOSIS — C541 Malignant neoplasm of endometrium: Secondary | ICD-10-CM | POA: Diagnosis not present

## 2018-10-21 DIAGNOSIS — Z5112 Encounter for antineoplastic immunotherapy: Secondary | ICD-10-CM | POA: Diagnosis not present

## 2018-10-21 DIAGNOSIS — Z5111 Encounter for antineoplastic chemotherapy: Secondary | ICD-10-CM | POA: Diagnosis not present

## 2018-10-21 DIAGNOSIS — R918 Other nonspecific abnormal finding of lung field: Secondary | ICD-10-CM | POA: Diagnosis not present

## 2018-10-21 DIAGNOSIS — Z90710 Acquired absence of both cervix and uterus: Secondary | ICD-10-CM | POA: Diagnosis not present

## 2018-10-21 DIAGNOSIS — I129 Hypertensive chronic kidney disease with stage 1 through stage 4 chronic kidney disease, or unspecified chronic kidney disease: Secondary | ICD-10-CM | POA: Diagnosis not present

## 2018-10-21 DIAGNOSIS — C773 Secondary and unspecified malignant neoplasm of axilla and upper limb lymph nodes: Secondary | ICD-10-CM

## 2018-10-21 DIAGNOSIS — Z7189 Other specified counseling: Secondary | ICD-10-CM

## 2018-10-21 DIAGNOSIS — Z9013 Acquired absence of bilateral breasts and nipples: Secondary | ICD-10-CM | POA: Diagnosis not present

## 2018-10-21 DIAGNOSIS — Z170 Estrogen receptor positive status [ER+]: Secondary | ICD-10-CM | POA: Diagnosis not present

## 2018-10-21 MED ORDER — SODIUM CHLORIDE 0.9 % IV SOLN
Freq: Once | INTRAVENOUS | Status: AC
  Administered 2018-10-21: 09:00:00 via INTRAVENOUS
  Filled 2018-10-21: qty 5

## 2018-10-21 MED ORDER — SODIUM CHLORIDE 0.9 % IV SOLN
Freq: Once | INTRAVENOUS | Status: AC
  Administered 2018-10-21: 09:00:00 via INTRAVENOUS
  Filled 2018-10-21: qty 250

## 2018-10-21 MED ORDER — PALONOSETRON HCL INJECTION 0.25 MG/5ML
INTRAVENOUS | Status: AC
  Filled 2018-10-21: qty 5

## 2018-10-21 MED ORDER — SODIUM CHLORIDE 0.9 % IV SOLN
20.0000 mg | Freq: Once | INTRAVENOUS | Status: AC
  Administered 2018-10-21: 20 mg via INTRAVENOUS
  Filled 2018-10-21: qty 2

## 2018-10-21 MED ORDER — PALONOSETRON HCL INJECTION 0.25 MG/5ML
0.2500 mg | Freq: Once | INTRAVENOUS | Status: AC
  Administered 2018-10-21: 0.25 mg via INTRAVENOUS

## 2018-10-21 MED ORDER — DIPHENHYDRAMINE HCL 50 MG/ML IJ SOLN
INTRAMUSCULAR | Status: AC
  Filled 2018-10-21: qty 1

## 2018-10-21 MED ORDER — DIPHENHYDRAMINE HCL 50 MG/ML IJ SOLN
50.0000 mg | Freq: Once | INTRAMUSCULAR | Status: AC
  Administered 2018-10-21: 50 mg via INTRAVENOUS

## 2018-10-21 MED ORDER — SODIUM CHLORIDE 0.9 % IV SOLN
311.5000 mg | Freq: Once | INTRAVENOUS | Status: AC
  Administered 2018-10-21: 310 mg via INTRAVENOUS
  Filled 2018-10-21: qty 31

## 2018-10-21 MED ORDER — HEPARIN SOD (PORK) LOCK FLUSH 100 UNIT/ML IV SOLN
500.0000 [IU] | Freq: Once | INTRAVENOUS | Status: AC | PRN
  Administered 2018-10-21: 500 [IU]
  Filled 2018-10-21: qty 5

## 2018-10-21 MED ORDER — SODIUM CHLORIDE 0.9% FLUSH
10.0000 mL | INTRAVENOUS | Status: DC | PRN
  Administered 2018-10-21: 10 mL
  Filled 2018-10-21: qty 10

## 2018-10-21 MED ORDER — SODIUM CHLORIDE 0.9 % IV SOLN
135.0000 mg/m2 | Freq: Once | INTRAVENOUS | Status: AC
  Administered 2018-10-21: 222 mg via INTRAVENOUS
  Filled 2018-10-21: qty 37

## 2018-10-21 NOTE — Telephone Encounter (Signed)
Faxed authorization form to [place] requesting echocardiogram report to [phone].

## 2018-10-21 NOTE — Progress Notes (Signed)
Went to registration to introduce myself as Arboriculturist and to offer available resources.  Discussed one-time $1000 Radio broadcast assistant to assist with personal expenses while going through treatment.  Gave her my card if interested in applying and for any additional financial questions or concerns.

## 2018-10-21 NOTE — Patient Instructions (Signed)
[facility] Discharge Instructions for Patients Receiving Chemotherapy  Today you received the following chemotherapy agents Taxol; Carboplatin  To help prevent nausea and vomiting after your treatment, we encourage you to take your nausea medication as directed   If you develop nausea and vomiting that is not controlled by your nausea medication, call the clinic.   BELOW ARE SYMPTOMS THAT SHOULD BE REPORTED IMMEDIATELY:  *FEVER GREATER THAN 100.5 F  *CHILLS WITH OR WITHOUT FEVER  NAUSEA AND VOMITING THAT IS NOT CONTROLLED WITH YOUR NAUSEA MEDICATION  *UNUSUAL SHORTNESS OF BREATH  *UNUSUAL BRUISING OR BLEEDING  TENDERNESS IN MOUTH AND THROAT WITH OR WITHOUT PRESENCE OF ULCERS  *URINARY PROBLEMS  *BOWEL PROBLEMS  UNUSUAL RASH Items with * indicate a potential emergency and should be followed up as soon as possible.  Feel free to call the clinic should you have any questions or concerns. The clinic phone number is [phone].  Please show the [facility] at check-in to the Emergency Department and triage nurse.  Paclitaxel injection What is this medicine? PACLITAXEL (PAK li TAX el) is a chemotherapy drug. It targets fast dividing cells, like cancer cells, and causes these cells to die. This medicine is used to treat ovarian cancer, breast cancer, lung cancer, Kaposi's sarcoma, and other cancers. This medicine may be used for other purposes; ask your health care provider or pharmacist if you have questions. COMMON BRAND NAME(S): Onxol, Taxol What should I tell my health care provider before I take this medicine? They need to know if you have any of these conditions: -history of irregular heartbeat -liver disease -low blood counts, like low white cell, platelet, or red cell counts -lung or breathing disease, like asthma -tingling of the fingers or toes, or other nerve disorder -an unusual or allergic reaction to paclitaxel, alcohol, polyoxyethylated  castor oil, other chemotherapy, other medicines, foods, dyes, or preservatives -pregnant or trying to get pregnant -breast-feeding How should I use this medicine? This drug is given as an infusion into a vein. It is administered in a hospital or clinic by a specially trained health care professional. Talk to your pediatrician regarding the use of this medicine in children. Special care may be needed. Overdosage: If you think you have taken too much of this medicine contact a poison control center or emergency room at once. NOTE: This medicine is only for you. Do not share this medicine with others. What if I miss a dose? It is important not to miss your dose. Call your doctor or health care professional if you are unable to keep an appointment. What may interact with this medicine? Do not take this medicine with any of the following medications: -disulfiram -metronidazole This medicine may also interact with the following medications: -antiviral medicines for hepatitis, HIV or AIDS -certain antibiotics like erythromycin and clarithromycin -certain medicines for fungal infections like ketoconazole and itraconazole -certain medicines for seizures like carbamazepine, phenobarbital, phenytoin -gemfibrozil -nefazodone -rifampin -St. John's wort This list may not describe all possible interactions. Give your health care provider a list of all the medicines, herbs, non-prescription drugs, or dietary supplements you use. Also tell them if you smoke, drink alcohol, or use illegal drugs. Some items may interact with your medicine. What should I watch for while using this medicine? Your condition will be monitored carefully while you are receiving this medicine. You will need important blood work done while you are taking this medicine. This medicine can cause serious allergic reactions. To reduce your  risk you will need to take other medicine(s) before treatment with this medicine. If you experience  allergic reactions like skin rash, itching or hives, swelling of the face, lips, or tongue, tell your doctor or health care professional right away. In some cases, you may be given additional medicines to help with side effects. Follow all directions for their use. This drug may make you feel generally unwell. This is not uncommon, as chemotherapy can affect healthy cells as well as cancer cells. Report any side effects. Continue your course of treatment even though you feel ill unless your doctor tells you to stop. Call your doctor or health care professional for advice if you get a fever, chills or sore throat, or other symptoms of a cold or flu. Do not treat yourself. This drug decreases your body's ability to fight infections. Try to avoid being around people who are sick. This medicine may increase your risk to bruise or bleed. Call your doctor or health care professional if you notice any unusual bleeding. Be careful brushing and flossing your teeth or using a toothpick because you may get an infection or bleed more easily. If you have any dental work done, tell your dentist you are receiving this medicine. Avoid taking products that contain aspirin, acetaminophen, ibuprofen, naproxen, or ketoprofen unless instructed by your doctor. These medicines may hide a fever. Do not become pregnant while taking this medicine. Women should inform their doctor if they wish to become pregnant or think they might be pregnant. There is a potential for serious side effects to an unborn child. Talk to your health care professional or pharmacist for more information. Do not breast-feed an infant while taking this medicine. Men are advised not to father a child while receiving this medicine. This product may contain alcohol. Ask your pharmacist or healthcare provider if this medicine contains alcohol. Be sure to tell all healthcare providers you are taking this medicine. Certain medicines, like metronidazole and  disulfiram, can cause an unpleasant reaction when taken with alcohol. The reaction includes flushing, headache, nausea, vomiting, sweating, and increased thirst. The reaction can last from 30 minutes to several hours. What side effects may I notice from receiving this medicine? Side effects that you should report to your doctor or health care professional as soon as possible: -allergic reactions like skin rash, itching or hives, swelling of the face, lips, or tongue -breathing problems -changes in vision -fast, irregular heartbeat -high or low blood pressure -mouth sores -pain, tingling, numbness in the hands or feet -signs of decreased platelets or bleeding - bruising, pinpoint red spots on the skin, black, tarry stools, blood in the urine -signs of decreased red blood cells - unusually weak or tired, feeling faint or lightheaded, falls -signs of infection - fever or chills, cough, sore throat, pain or difficulty passing urine -signs and symptoms of liver injury like dark yellow or brown urine; general ill feeling or flu-like symptoms; light-colored stools; loss of appetite; nausea; right upper belly pain; unusually weak or tired; yellowing of the eyes or skin -swelling of the ankles, feet, hands -unusually slow heartbeat Side effects that usually do not require medical attention (report to your doctor or health care professional if they continue or are bothersome): -diarrhea -hair loss -loss of appetite -muscle or joint pain -nausea, vomiting -pain, redness, or irritation at site where injected -tiredness This list may not describe all possible side effects. Call your doctor for medical advice about side effects. You may report side effects to  FDA at 1-800-FDA-1088. Where should I keep my medicine? This drug is given in a hospital or clinic and will not be stored at home. NOTE: This sheet is a summary. It may not cover all possible information. If you have questions about this medicine,  talk to your doctor, pharmacist, or health care provider.  2019 [employer]/Gold Standard (2017-03-04 13:14:55)   Carboplatin injection What is this medicine? CARBOPLATIN (KAR boe pla tin) is a chemotherapy drug. It targets fast dividing cells, like cancer cells, and causes these cells to die. This medicine is used to treat ovarian cancer and many other cancers. This medicine may be used for other purposes; ask your health care provider or pharmacist if you have questions. COMMON BRAND NAME(S): Paraplatin What should I tell my health care provider before I take this medicine? They need to know if you have any of these conditions: -blood disorders -hearing problems -kidney disease -recent or ongoing radiation therapy -an unusual or allergic reaction to carboplatin, cisplatin, other chemotherapy, other medicines, foods, dyes, or preservatives -pregnant or trying to get pregnant -breast-feeding How should I use this medicine? This drug is usually given as an infusion into a vein. It is administered in a hospital or clinic by a specially trained health care professional. Talk to your pediatrician regarding the use of this medicine in children. Special care may be needed. Overdosage: If you think you have taken too much of this medicine contact a poison control center or emergency room at once. NOTE: This medicine is only for you. Do not share this medicine with others. What if I miss a dose? It is important not to miss a dose. Call your doctor or health care professional if you are unable to keep an appointment. What may interact with this medicine? -medicines for seizures -medicines to increase blood counts like filgrastim, pegfilgrastim, sargramostim -some antibiotics like amikacin, gentamicin, neomycin, streptomycin, tobramycin -vaccines Talk to your doctor or health care professional before taking any of these medicines: -acetaminophen -aspirin -ibuprofen -ketoprofen -naproxen This  list may not describe all possible interactions. Give your health care provider a list of all the medicines, herbs, non-prescription drugs, or dietary supplements you use. Also tell them if you smoke, drink alcohol, or use illegal drugs. Some items may interact with your medicine. What should I watch for while using this medicine? Your condition will be monitored carefully while you are receiving this medicine. You will need important blood work done while you are taking this medicine. This drug may make you feel generally unwell. This is not uncommon, as chemotherapy can affect healthy cells as well as cancer cells. Report any side effects. Continue your course of treatment even though you feel ill unless your doctor tells you to stop. In some cases, you may be given additional medicines to help with side effects. Follow all directions for their use. Call your doctor or health care professional for advice if you get a fever, chills or sore throat, or other symptoms of a cold or flu. Do not treat yourself. This drug decreases your body's ability to fight infections. Try to avoid being around people who are sick. This medicine may increase your risk to bruise or bleed. Call your doctor or health care professional if you notice any unusual bleeding. Be careful brushing and flossing your teeth or using a toothpick because you may get an infection or bleed more easily. If you have any dental work done, tell your dentist you are receiving this medicine. Avoid taking products  that contain aspirin, acetaminophen, ibuprofen, naproxen, or ketoprofen unless instructed by your doctor. These medicines may hide a fever. Do not become pregnant while taking this medicine. Women should inform their doctor if they wish to become pregnant or think they might be pregnant. There is a potential for serious side effects to an unborn child. Talk to your health care professional or pharmacist for more information. Do not  breast-feed an infant while taking this medicine. What side effects may I notice from receiving this medicine? Side effects that you should report to your doctor or health care professional as soon as possible: -allergic reactions like skin rash, itching or hives, swelling of the face, lips, or tongue -signs of infection - fever or chills, cough, sore throat, pain or difficulty passing urine -signs of decreased platelets or bleeding - bruising, pinpoint red spots on the skin, black, tarry stools, nosebleeds -signs of decreased red blood cells - unusually weak or tired, fainting spells, lightheadedness -breathing problems -changes in hearing -changes in vision -chest pain -high blood pressure -low blood counts - This drug may decrease the number of white blood cells, red blood cells and platelets. You may be at increased risk for infections and bleeding. -nausea and vomiting -pain, swelling, redness or irritation at the injection site -pain, tingling, numbness in the hands or feet -problems with balance, talking, walking -trouble passing urine or change in the amount of urine Side effects that usually do not require medical attention (report to your doctor or health care professional if they continue or are bothersome): -hair loss -loss of appetite -metallic taste in the mouth or changes in taste This list may not describe all possible side effects. Call your doctor for medical advice about side effects. You may report side effects to FDA at 1-800-FDA-1088. Where should I keep my medicine? This drug is given in a hospital or clinic and will not be stored at home. NOTE: This sheet is a summary. It may not cover all possible information. If you have questions about this medicine, talk to your doctor, pharmacist, or health care provider.  2019 Elsevier/Gold Standard (2007-10-06 14:38:05)

## 2018-10-21 NOTE — Telephone Encounter (Signed)
Left a message for [facility] to check status of request for echo.

## 2018-10-22 ENCOUNTER — Other Ambulatory Visit: Payer: Self-pay | Admitting: Hematology and Oncology

## 2018-10-22 ENCOUNTER — Encounter: Payer: Self-pay | Admitting: Oncology

## 2018-10-22 ENCOUNTER — Telehealth: Payer: Self-pay | Admitting: Emergency Medicine

## 2018-10-22 NOTE — Telephone Encounter (Signed)
First time chemo f/u call.  Pt denies any questions or concerns at this time.  VU to call back as needed.

## 2018-10-22 NOTE — Progress Notes (Signed)
ON PATHWAY REGIMEN - Uterine  No Change  Continue With Treatment as Ordered.     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

## 2018-10-22 NOTE — Telephone Encounter (Signed)
-----   Message from Priscille Loveless, RN sent at 10/21/2018  8:38 AM EDT ----- Regarding: First Chemo Follow up [place] Follow up Alvy Bimler

## 2018-10-23 ENCOUNTER — Telehealth: Payer: Self-pay | Admitting: Hematology and Oncology

## 2018-10-23 NOTE — Telephone Encounter (Signed)
Scheduled per sch msg. Called and spoke with patient. Confirmed dates and times  °

## 2018-10-26 ENCOUNTER — Telehealth: Payer: Self-pay | Admitting: Oncology

## 2018-10-26 NOTE — Telephone Encounter (Signed)
Called Vicki Cervantes and let her know that Dr. Alvy Bimler has received her ECHO report from [place] and has canceled the order for the ECHO to be done here.  Vicki Cervantes verbalized understanding and agreement.

## 2018-11-05 ENCOUNTER — Ambulatory Visit: Payer: Medicare HMO | Admitting: Gynecologic Oncology

## 2018-11-10 ENCOUNTER — Other Ambulatory Visit: Payer: Self-pay

## 2018-11-10 ENCOUNTER — Inpatient Hospital Stay: Payer: Medicare HMO

## 2018-11-10 ENCOUNTER — Inpatient Hospital Stay (HOSPITAL_BASED_OUTPATIENT_CLINIC_OR_DEPARTMENT_OTHER): Payer: Medicare HMO | Admitting: Hematology and Oncology

## 2018-11-10 VITALS — BP 156/70 | HR 69 | Temp 97.8°F | Resp 18 | Ht 62.5 in | Wt 133.4 lb

## 2018-11-10 DIAGNOSIS — C541 Malignant neoplasm of endometrium: Secondary | ICD-10-CM | POA: Diagnosis not present

## 2018-11-10 DIAGNOSIS — Z7982 Long term (current) use of aspirin: Secondary | ICD-10-CM

## 2018-11-10 DIAGNOSIS — N183 Chronic kidney disease, stage 3 unspecified: Secondary | ICD-10-CM

## 2018-11-10 DIAGNOSIS — Z79899 Other long term (current) drug therapy: Secondary | ICD-10-CM

## 2018-11-10 DIAGNOSIS — I2510 Atherosclerotic heart disease of native coronary artery without angina pectoris: Secondary | ICD-10-CM

## 2018-11-10 DIAGNOSIS — Z170 Estrogen receptor positive status [ER+]: Secondary | ICD-10-CM | POA: Diagnosis not present

## 2018-11-10 DIAGNOSIS — E039 Hypothyroidism, unspecified: Secondary | ICD-10-CM

## 2018-11-10 DIAGNOSIS — I129 Hypertensive chronic kidney disease with stage 1 through stage 4 chronic kidney disease, or unspecified chronic kidney disease: Secondary | ICD-10-CM | POA: Diagnosis not present

## 2018-11-10 DIAGNOSIS — Z800 Family history of malignant neoplasm of digestive organs: Secondary | ICD-10-CM

## 2018-11-10 DIAGNOSIS — C779 Secondary and unspecified malignant neoplasm of lymph node, unspecified: Secondary | ICD-10-CM | POA: Diagnosis not present

## 2018-11-10 DIAGNOSIS — Z90710 Acquired absence of both cervix and uterus: Secondary | ICD-10-CM | POA: Diagnosis not present

## 2018-11-10 DIAGNOSIS — R918 Other nonspecific abnormal finding of lung field: Secondary | ICD-10-CM

## 2018-11-10 DIAGNOSIS — C773 Secondary and unspecified malignant neoplasm of axilla and upper limb lymph nodes: Secondary | ICD-10-CM

## 2018-11-10 DIAGNOSIS — Z9013 Acquired absence of bilateral breasts and nipples: Secondary | ICD-10-CM | POA: Diagnosis not present

## 2018-11-10 DIAGNOSIS — M898X9 Other specified disorders of bone, unspecified site: Secondary | ICD-10-CM | POA: Insufficient documentation

## 2018-11-10 DIAGNOSIS — E7800 Pure hypercholesterolemia, unspecified: Secondary | ICD-10-CM

## 2018-11-10 DIAGNOSIS — I700 Atherosclerosis of aorta: Secondary | ICD-10-CM

## 2018-11-10 DIAGNOSIS — Z853 Personal history of malignant neoplasm of breast: Secondary | ICD-10-CM

## 2018-11-10 DIAGNOSIS — Z5111 Encounter for antineoplastic chemotherapy: Secondary | ICD-10-CM | POA: Diagnosis not present

## 2018-11-10 DIAGNOSIS — K219 Gastro-esophageal reflux disease without esophagitis: Secondary | ICD-10-CM

## 2018-11-10 DIAGNOSIS — Z5112 Encounter for antineoplastic immunotherapy: Secondary | ICD-10-CM | POA: Diagnosis not present

## 2018-11-10 DIAGNOSIS — Z7189 Other specified counseling: Secondary | ICD-10-CM

## 2018-11-10 DIAGNOSIS — M1990 Unspecified osteoarthritis, unspecified site: Secondary | ICD-10-CM

## 2018-11-10 LAB — CBC WITH DIFFERENTIAL (CANCER CENTER ONLY)
Abs Immature Granulocytes: 0.07 10*3/uL (ref 0.00–0.07)
Basophils Absolute: 0.1 10*3/uL (ref 0.0–0.1)
Basophils Relative: 1 %
Eosinophils Absolute: 0.1 10*3/uL (ref 0.0–0.5)
Eosinophils Relative: 2 %
HCT: 33.4 % — ABNORMAL LOW (ref 36.0–46.0)
Hemoglobin: 11.6 g/dL — ABNORMAL LOW (ref 12.0–15.0)
Immature Granulocytes: 1 %
Lymphocytes Relative: 20 %
Lymphs Abs: 1.3 10*3/uL (ref 0.7–4.0)
MCH: 30.9 pg (ref 26.0–34.0)
MCHC: 34.7 g/dL (ref 30.0–36.0)
MCV: 88.8 fL (ref 80.0–100.0)
Monocytes Absolute: 0.7 10*3/uL (ref 0.1–1.0)
Monocytes Relative: 11 %
Neutro Abs: 4.2 10*3/uL (ref 1.7–7.7)
Neutrophils Relative %: 65 %
Platelet Count: 181 10*3/uL (ref 150–400)
RBC: 3.76 MIL/uL — ABNORMAL LOW (ref 3.87–5.11)
RDW: 12.4 % (ref 11.5–15.5)
WBC Count: 6.5 10*3/uL (ref 4.0–10.5)
nRBC: 0 % (ref 0.0–0.2)

## 2018-11-10 LAB — CMP (CANCER CENTER ONLY)
ALT: 17 U/L (ref 0–44)
AST: 23 U/L (ref 15–41)
Albumin: 4 g/dL (ref 3.5–5.0)
Alkaline Phosphatase: 93 U/L (ref 38–126)
Anion gap: 12 (ref 5–15)
BUN: 18 mg/dL (ref 8–23)
CO2: 27 mmol/L (ref 22–32)
Calcium: 9 mg/dL (ref 8.9–10.3)
Chloride: 97 mmol/L — ABNORMAL LOW (ref 98–111)
Creatinine: 1.28 mg/dL — ABNORMAL HIGH (ref 0.44–1.00)
GFR, Est AFR Am: 46 mL/min — ABNORMAL LOW (ref >60)
GFR, Estimated: 40 mL/min — ABNORMAL LOW (ref >60)
Glucose, Bld: 106 mg/dL — ABNORMAL HIGH (ref 70–99)
Potassium: 3.9 mmol/L (ref 3.5–5.1)
Sodium: 136 mmol/L (ref 135–145)
Total Bilirubin: 0.4 mg/dL (ref 0.3–1.2)
Total Protein: 7.1 g/dL (ref 6.5–8.1)

## 2018-11-10 MED ORDER — OXYCODONE HCL 5 MG PO TABS: 5.0000 mg | ORAL_TABLET | Freq: Four times a day (QID) | ORAL | 0 refills | Status: DC | PRN

## 2018-11-11 ENCOUNTER — Other Ambulatory Visit: Payer: Medicare HMO

## 2018-11-11 ENCOUNTER — Ambulatory Visit: Payer: Medicare HMO

## 2018-11-11 ENCOUNTER — Inpatient Hospital Stay: Payer: Medicare HMO

## 2018-11-11 ENCOUNTER — Encounter: Payer: Self-pay | Admitting: Hematology and Oncology

## 2018-11-11 ENCOUNTER — Ambulatory Visit: Payer: Medicare HMO | Admitting: Hematology and Oncology

## 2018-11-11 ENCOUNTER — Other Ambulatory Visit: Payer: Self-pay

## 2018-11-11 VITALS — BP 123/79 | HR 90 | Temp 97.9°F | Resp 18

## 2018-11-11 DIAGNOSIS — Z7189 Other specified counseling: Secondary | ICD-10-CM

## 2018-11-11 DIAGNOSIS — Z9013 Acquired absence of bilateral breasts and nipples: Secondary | ICD-10-CM | POA: Diagnosis not present

## 2018-11-11 DIAGNOSIS — C541 Malignant neoplasm of endometrium: Secondary | ICD-10-CM

## 2018-11-11 DIAGNOSIS — Z170 Estrogen receptor positive status [ER+]: Secondary | ICD-10-CM | POA: Diagnosis not present

## 2018-11-11 DIAGNOSIS — R918 Other nonspecific abnormal finding of lung field: Secondary | ICD-10-CM | POA: Diagnosis not present

## 2018-11-11 DIAGNOSIS — Z5111 Encounter for antineoplastic chemotherapy: Secondary | ICD-10-CM | POA: Diagnosis not present

## 2018-11-11 DIAGNOSIS — Z5112 Encounter for antineoplastic immunotherapy: Secondary | ICD-10-CM | POA: Diagnosis not present

## 2018-11-11 DIAGNOSIS — I129 Hypertensive chronic kidney disease with stage 1 through stage 4 chronic kidney disease, or unspecified chronic kidney disease: Secondary | ICD-10-CM | POA: Diagnosis not present

## 2018-11-11 DIAGNOSIS — C779 Secondary and unspecified malignant neoplasm of lymph node, unspecified: Secondary | ICD-10-CM | POA: Diagnosis not present

## 2018-11-11 DIAGNOSIS — C773 Secondary and unspecified malignant neoplasm of axilla and upper limb lymph nodes: Secondary | ICD-10-CM

## 2018-11-11 DIAGNOSIS — Z90710 Acquired absence of both cervix and uterus: Secondary | ICD-10-CM | POA: Diagnosis not present

## 2018-11-11 DIAGNOSIS — Z853 Personal history of malignant neoplasm of breast: Secondary | ICD-10-CM | POA: Diagnosis not present

## 2018-11-11 MED ORDER — DIPHENHYDRAMINE HCL 50 MG/ML IJ SOLN
50.0000 mg | Freq: Once | INTRAMUSCULAR | Status: AC
  Administered 2018-11-11: 50 mg via INTRAVENOUS

## 2018-11-11 MED ORDER — HEPARIN SOD (PORK) LOCK FLUSH 100 UNIT/ML IV SOLN
500.0000 [IU] | Freq: Once | INTRAVENOUS | Status: AC | PRN
  Administered 2018-11-11: 500 [IU]
  Filled 2018-11-11: qty 5

## 2018-11-11 MED ORDER — TRASTUZUMAB CHEMO 150 MG IV SOLR
450.0000 mg | Freq: Once | INTRAVENOUS | Status: AC
  Administered 2018-11-11: 12:00:00 450 mg via INTRAVENOUS
  Filled 2018-11-11: qty 21.43

## 2018-11-11 MED ORDER — PALONOSETRON HCL INJECTION 0.25 MG/5ML
0.2500 mg | Freq: Once | INTRAVENOUS | Status: AC
  Administered 2018-11-11: 11:00:00 0.25 mg via INTRAVENOUS

## 2018-11-11 MED ORDER — FAMOTIDINE IN NACL 20-0.9 MG/50ML-% IV SOLN
INTRAVENOUS | Status: AC
  Filled 2018-11-11: qty 50

## 2018-11-11 MED ORDER — PALONOSETRON HCL INJECTION 0.25 MG/5ML
INTRAVENOUS | Status: AC
  Filled 2018-11-11: qty 5

## 2018-11-11 MED ORDER — ACETAMINOPHEN 325 MG PO TABS
650.0000 mg | ORAL_TABLET | Freq: Once | ORAL | Status: AC
  Administered 2018-11-11: 650 mg via ORAL

## 2018-11-11 MED ORDER — SODIUM CHLORIDE 0.9 % IV SOLN
Freq: Once | INTRAVENOUS | Status: DC
  Filled 2018-11-11: qty 250

## 2018-11-11 MED ORDER — SODIUM CHLORIDE 0.9 % IV SOLN
Freq: Once | INTRAVENOUS | Status: AC
  Administered 2018-11-11: 11:00:00 via INTRAVENOUS
  Filled 2018-11-11: qty 250

## 2018-11-11 MED ORDER — ACETAMINOPHEN 325 MG PO TABS
ORAL_TABLET | ORAL | Status: AC
  Filled 2018-11-11: qty 2

## 2018-11-11 MED ORDER — FAMOTIDINE IN NACL 20-0.9 MG/50ML-% IV SOLN
20.0000 mg | Freq: Once | INTRAVENOUS | Status: AC
  Administered 2018-11-11: 11:00:00 20 mg via INTRAVENOUS

## 2018-11-11 MED ORDER — DIPHENHYDRAMINE HCL 50 MG/ML IJ SOLN
INTRAMUSCULAR | Status: AC
  Filled 2018-11-11: qty 1

## 2018-11-11 MED ORDER — SODIUM CHLORIDE 0.9 % IV SOLN
135.0000 mg/m2 | Freq: Once | INTRAVENOUS | Status: AC
  Administered 2018-11-11: 222 mg via INTRAVENOUS
  Filled 2018-11-11: qty 37

## 2018-11-11 MED ORDER — SODIUM CHLORIDE 0.9 % IV SOLN
Freq: Once | INTRAVENOUS | Status: AC
  Administered 2018-11-11: 11:00:00 via INTRAVENOUS
  Filled 2018-11-11: qty 5

## 2018-11-11 MED ORDER — SODIUM CHLORIDE 0.9% FLUSH
10.0000 mL | INTRAVENOUS | Status: DC | PRN
  Administered 2018-11-11: 10 mL
  Filled 2018-11-11: qty 10

## 2018-11-11 MED ORDER — SODIUM CHLORIDE 0.9 % IV SOLN
302.5000 mg | Freq: Once | INTRAVENOUS | Status: AC
  Administered 2018-11-11: 300 mg via INTRAVENOUS
  Filled 2018-11-11: qty 30

## 2018-11-11 NOTE — Patient Instructions (Signed)
[facility] Discharge Instructions for Patients Receiving Chemotherapy  Today you received the following chemotherapy agents: [name], Taxol, Carboplatin  To help prevent nausea and vomiting after your treatment, we encourage you to take your nausea medication as directed   If you develop nausea and vomiting that is not controlled by your nausea medication, call the clinic.   BELOW ARE SYMPTOMS THAT SHOULD BE REPORTED IMMEDIATELY:  *FEVER GREATER THAN 100.5 F  *CHILLS WITH OR WITHOUT FEVER  NAUSEA AND VOMITING THAT IS NOT CONTROLLED WITH YOUR NAUSEA MEDICATION  *UNUSUAL SHORTNESS OF BREATH  *UNUSUAL BRUISING OR BLEEDING  TENDERNESS IN MOUTH AND THROAT WITH OR WITHOUT PRESENCE OF ULCERS  *URINARY PROBLEMS  *BOWEL PROBLEMS  UNUSUAL RASH Items with * indicate a potential emergency and should be followed up as soon as possible.  Feel free to call the clinic should you have any questions or concerns. The clinic phone number is [phone].  Please show the [facility] at check-in to the Emergency Department and triage nurse.  Paclitaxel injection What is this medicine? PACLITAXEL (PAK li TAX el) is a chemotherapy drug. It targets fast dividing cells, like cancer cells, and causes these cells to die. This medicine is used to treat ovarian cancer, breast cancer, lung cancer, Kaposi's sarcoma, and other cancers. This medicine may be used for other purposes; ask your health care provider or pharmacist if you have questions. COMMON BRAND NAME(S): Onxol, Taxol What should I tell my health care provider before I take this medicine? They need to know if you have any of these conditions: -history of irregular heartbeat -liver disease -low blood counts, like low white cell, platelet, or red cell counts -lung or breathing disease, like asthma -tingling of the fingers or toes, or other nerve disorder -an unusual or allergic reaction to paclitaxel, alcohol,  polyoxyethylated castor oil, other chemotherapy, other medicines, foods, dyes, or preservatives -pregnant or trying to get pregnant -breast-feeding How should I use this medicine? This drug is given as an infusion into a vein. It is administered in a hospital or clinic by a specially trained health care professional. Talk to your pediatrician regarding the use of this medicine in children. Special care may be needed. Overdosage: If you think you have taken too much of this medicine contact a poison control center or emergency room at once. NOTE: This medicine is only for you. Do not share this medicine with others. What if I miss a dose? It is important not to miss your dose. Call your doctor or health care professional if you are unable to keep an appointment. What may interact with this medicine? Do not take this medicine with any of the following medications: -disulfiram -metronidazole This medicine may also interact with the following medications: -antiviral medicines for hepatitis, HIV or AIDS -certain antibiotics like erythromycin and clarithromycin -certain medicines for fungal infections like ketoconazole and itraconazole -certain medicines for seizures like carbamazepine, phenobarbital, phenytoin -gemfibrozil -nefazodone -rifampin -St. John's wort This list may not describe all possible interactions. Give your health care provider a list of all the medicines, herbs, non-prescription drugs, or dietary supplements you use. Also tell them if you smoke, drink alcohol, or use illegal drugs. Some items may interact with your medicine. What should I watch for while using this medicine? Your condition will be monitored carefully while you are receiving this medicine. You will need important blood work done while you are taking this medicine. This medicine can cause serious allergic reactions. To reduce  your risk you will need to take other medicine(s) before treatment with this medicine.  If you experience allergic reactions like skin rash, itching or hives, swelling of the face, lips, or tongue, tell your doctor or health care professional right away. In some cases, you may be given additional medicines to help with side effects. Follow all directions for their use. This drug may make you feel generally unwell. This is not uncommon, as chemotherapy can affect healthy cells as well as cancer cells. Report any side effects. Continue your course of treatment even though you feel ill unless your doctor tells you to stop. Call your doctor or health care professional for advice if you get a fever, chills or sore throat, or other symptoms of a cold or flu. Do not treat yourself. This drug decreases your body's ability to fight infections. Try to avoid being around people who are sick. This medicine may increase your risk to bruise or bleed. Call your doctor or health care professional if you notice any unusual bleeding. Be careful brushing and flossing your teeth or using a toothpick because you may get an infection or bleed more easily. If you have any dental work done, tell your dentist you are receiving this medicine. Avoid taking products that contain aspirin, acetaminophen, ibuprofen, naproxen, or ketoprofen unless instructed by your doctor. These medicines may hide a fever. Do not become pregnant while taking this medicine. Women should inform their doctor if they wish to become pregnant or think they might be pregnant. There is a potential for serious side effects to an unborn child. Talk to your health care professional or pharmacist for more information. Do not breast-feed an infant while taking this medicine. Men are advised not to father a child while receiving this medicine. This product may contain alcohol. Ask your pharmacist or healthcare provider if this medicine contains alcohol. Be sure to tell all healthcare providers you are taking this medicine. Certain medicines, like  metronidazole and disulfiram, can cause an unpleasant reaction when taken with alcohol. The reaction includes flushing, headache, nausea, vomiting, sweating, and increased thirst. The reaction can last from 30 minutes to several hours. What side effects may I notice from receiving this medicine? Side effects that you should report to your doctor or health care professional as soon as possible: -allergic reactions like skin rash, itching or hives, swelling of the face, lips, or tongue -breathing problems -changes in vision -fast, irregular heartbeat -high or low blood pressure -mouth sores -pain, tingling, numbness in the hands or feet -signs of decreased platelets or bleeding - bruising, pinpoint red spots on the skin, black, tarry stools, blood in the urine -signs of decreased red blood cells - unusually weak or tired, feeling faint or lightheaded, falls -signs of infection - fever or chills, cough, sore throat, pain or difficulty passing urine -signs and symptoms of liver injury like dark yellow or brown urine; general ill feeling or flu-like symptoms; light-colored stools; loss of appetite; nausea; right upper belly pain; unusually weak or tired; yellowing of the eyes or skin -swelling of the ankles, feet, hands -unusually slow heartbeat Side effects that usually do not require medical attention (report to your doctor or health care professional if they continue or are bothersome): -diarrhea -hair loss -loss of appetite -muscle or joint pain -nausea, vomiting -pain, redness, or irritation at site where injected -tiredness This list may not describe all possible side effects. Call your doctor for medical advice about side effects. You may report side effects   to FDA at 1-800-FDA-1088. Where should I keep my medicine? This drug is given in a hospital or clinic and will not be stored at home. NOTE: This sheet is a summary. It may not cover all possible information. If you have questions  about this medicine, talk to your doctor, pharmacist, or health care provider.  2019 [employer] Standard (2017-03-04 13:14:55)   Carboplatin injection What is this medicine? CARBOPLATIN (KAR boe pla tin) is a chemotherapy drug. It targets fast dividing cells, like cancer cells, and causes these cells to die. This medicine is used to treat ovarian cancer and many other cancers. This medicine may be used for other purposes; ask your health care provider or pharmacist if you have questions. COMMON BRAND NAME(S): Paraplatin What should I tell my health care provider before I take this medicine? They need to know if you have any of these conditions: -blood disorders -hearing problems -kidney disease -recent or ongoing radiation therapy -an unusual or allergic reaction to carboplatin, cisplatin, other chemotherapy, other medicines, foods, dyes, or preservatives -pregnant or trying to get pregnant -breast-feeding How should I use this medicine? This drug is usually given as an infusion into a vein. It is administered in a hospital or clinic by a specially trained health care professional. Talk to your pediatrician regarding the use of this medicine in children. Special care may be needed. Overdosage: If you think you have taken too much of this medicine contact a poison control center or emergency room at once. NOTE: This medicine is only for you. Do not share this medicine with others. What if I miss a dose? It is important not to miss a dose. Call your doctor or health care professional if you are unable to keep an appointment. What may interact with this medicine? -medicines for seizures -medicines to increase blood counts like filgrastim, pegfilgrastim, sargramostim -some antibiotics like amikacin, gentamicin, neomycin, streptomycin, tobramycin -vaccines Talk to your doctor or health care professional before taking any of these  medicines: -acetaminophen -aspirin -ibuprofen -ketoprofen -naproxen This list may not describe all possible interactions. Give your health care provider a list of all the medicines, herbs, non-prescription drugs, or dietary supplements you use. Also tell them if you smoke, drink alcohol, or use illegal drugs. Some items may interact with your medicine. What should I watch for while using this medicine? Your condition will be monitored carefully while you are receiving this medicine. You will need important blood work done while you are taking this medicine. This drug may make you feel generally unwell. This is not uncommon, as chemotherapy can affect healthy cells as well as cancer cells. Report any side effects. Continue your course of treatment even though you feel ill unless your doctor tells you to stop. In some cases, you may be given additional medicines to help with side effects. Follow all directions for their use. Call your doctor or health care professional for advice if you get a fever, chills or sore throat, or other symptoms of a cold or flu. Do not treat yourself. This drug decreases your body's ability to fight infections. Try to avoid being around people who are sick. This medicine may increase your risk to bruise or bleed. Call your doctor or health care professional if you notice any unusual bleeding. Be careful brushing and flossing your teeth or using a toothpick because you may get an infection or bleed more easily. If you have any dental work done, tell your dentist you are receiving this medicine. Avoid taking  products that contain aspirin, acetaminophen, ibuprofen, naproxen, or ketoprofen unless instructed by your doctor. These medicines may hide a fever. Do not become pregnant while taking this medicine. Women should inform their doctor if they wish to become pregnant or think they might be pregnant. There is a potential for serious side effects to an unborn child. Talk to  your health care professional or pharmacist for more information. Do not breast-feed an infant while taking this medicine. What side effects may I notice from receiving this medicine? Side effects that you should report to your doctor or health care professional as soon as possible: -allergic reactions like skin rash, itching or hives, swelling of the face, lips, or tongue -signs of infection - fever or chills, cough, sore throat, pain or difficulty passing urine -signs of decreased platelets or bleeding - bruising, pinpoint red spots on the skin, black, tarry stools, nosebleeds -signs of decreased red blood cells - unusually weak or tired, fainting spells, lightheadedness -breathing problems -changes in hearing -changes in vision -chest pain -high blood pressure -low blood counts - This drug may decrease the number of white blood cells, red blood cells and platelets. You may be at increased risk for infections and bleeding. -nausea and vomiting -pain, swelling, redness or irritation at the injection site -pain, tingling, numbness in the hands or feet -problems with balance, talking, walking -trouble passing urine or change in the amount of urine Side effects that usually do not require medical attention (report to your doctor or health care professional if they continue or are bothersome): -hair loss -loss of appetite -metallic taste in the mouth or changes in taste This list may not describe all possible side effects. Call your doctor for medical advice about side effects. You may report side effects to FDA at 1-800-FDA-1088. Where should I keep my medicine? This drug is given in a hospital or clinic and will not be stored at home. NOTE: This sheet is a summary. It may not cover all possible information. If you have questions about this medicine, talk to your doctor, pharmacist, or health care provider.  2019 [employer]/Gold Standard (2007-10-06 14:38:05)

## 2018-11-11 NOTE — Assessment & Plan Note (Signed)
Her renal function is stable Continue to observe and adjust medication as tolerated

## 2018-11-11 NOTE — Assessment & Plan Note (Signed)
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

## 2018-11-11 NOTE — Assessment & Plan Note (Signed)
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

## 2018-11-11 NOTE — Progress Notes (Signed)
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 We were able to find a copy of her baseline echocardiogram from February We discussed the risk, benefits, side effects of [name], in addition to her treatment that she has received with carboplatin and Taxol She is willing to proceed My plan would be 3 cycles of chemotherapy before repeat imaging study for objective assessment of response to therapy So far, she tolerated carboplatin and Taxol well without major side effects except for just minor bone pain from treatment  Bone pain She has significant bone pain from Taxol I recommend over-the-counter analgesics and oxycodone as needed for severe pain We discussed risk of sedation, nausea and constipation with oxycodone We discussed narcotic refill policy  CKD (chronic kidney disease), stage III ([place]) Her renal function is stable Continue to observe and adjust medication as tolerated   No orders of the defined types were placed in this encounter.   INTERVAL HISTORY: Please see below for problem oriented charting. She returns to be seen prior to cycle 2 of chemotherapy With cycle 1, she developed significant bone pain No peripheral neuropathy Denies significant nausea or constipation No recent fever or chills with treatment She is interested to add [name] to her treatment based on recent available data and would like to know more about it  SUMMARY OF ONCOLOGIC HISTORY: Oncology History   Vicki Cervantes  has a remote history of left  breast cancer at age 62 but received BRCA testing approximately 5 years ago which was negative. Her cancer was treated with surgery but no radiation or chemotherapy.   Serous endometrial cancer, MSI stable ER  positive, PR neg, Her2/neu 3+        Endometrial cancer ([place])   08/29/2016 Pathology Results    Endometrium, biopsy - HIGH GRADE ENDOMETRIAL CARCINOMA, SEE COMMENT. Microscopic Comment The sections show multiple fragments of adenocarcinoma displaying glandular and papillary patterns associated with high grade cytomorphology characterized by nuclear pleomorphism, prominent nucleoli and brisk mitosis. Immunohistochemical stains show that the tumor cells are positive for vimentin, p16, p53 with increased Ki-67 expression. Estrogen and progesterone receptor stains show patchy weak positivity. No significant positivity is seen with CEA. The findings are consistent with high grade endometrial carcinoma and the overall morphology and phenotypic features favor serous carcinoma.    08/29/2016 Initial Diagnosis    She presented with postmenopausal bleeding    10/03/2016 Imaging    CT C/A/P 09/2016 IMPRESSION: 1. Thickening of the endometrial canal up to 19 mm in fundus, presumably corresponding to the patient's reported endometrial carcinoma. 2. Multiple tiny pulmonary nodules scattered throughout the lungs bilaterally measuring 4 mm or less in size. Nodules of this size are typically considered statistically likely benign. In the setting of known primary malignancy, metastatic disease to the lungs is not excluded, but is not strongly favored on today's examination. Attention on followup studies is recommended to ensure the stability or resolution of these nodules. 3. Subcentimeter low-attenuation lesion in the central aspect of segment 8 of the liver is too small to characterize. This is statistically likely a tiny cyst, but warrants attention on follow-up studies to exclude the possibility of a solitary hepatic metastasis. 4. 1.5 x 1.5 x 1.7 cm well-circumscribed lesion in the proximal stomach. This is of uncertain etiology and  significance, and could represent a benign gastric polyp, however, further  evaluation with nonemergent endoscopy is suggested in the near future for further evaluation. 5. **An incidental finding of potential clinical significance has been found. 1.1 x 1.6 cm thyroid nodule in the inferior aspect of the right lobe of the thyroid gland. Follow-up evaluation with nonemergent thyroid ultrasound is recommended in the near future to better evaluate this finding. This recommendation follows ACR consensuss guidelines: Managing Incidental Thyroid Nodules Detected on Imaging: White Paper of the ACR Incidental Thyroid Findings Committee. J Am Coll Radiol 2015;12(2):143-150.** 6. Aortic atherosclerosis, in addition to left anterior descending coronary artery disease    10/22/2016 Surgery    Robotic assisted total hysterectomy, BSO and bilateral pelvic lymphadenectomy  Final pathology revealed a 3cm polyp containing serous carcinoma but with no myometrial invasion, no LVSI and negative nodes.  Stage IA Uterine serous cancer    10/22/2016 Pathology Results    1. Lymph nodes, regional resection, right pelvic - SIX BENIGN LYMPH NODES (0/6). 2. Lymph nodes, regional resection, left pelvic - SEVEN BENIGN LYMPH NODES (0/7). 3. Uterus +/- tubes/ovaries, neoplastic, with right ovary and fallopian tube ENDOMYOMETRIUM - SEROUS CARCINOMA ARISING WITHIN AN ENDOMETRIAL POLYP - NO MYOMETRIAL INVASION IDENTIFIED - ADENOMYOSIS - LEIOMYOMA (1 CM) - SEE ONCOLOGY TABLE AND COMMENT CERVIX - CARCINOMA FOCALLY INVOLVES ENDOCERVICAL GLANDS - NABOTHIAN CYSTS RIGHT ADNEXA - BENIGN OVARY AND FALLOPIAN TUBE - NO CARCINOMA IDENTIFIED 4. Cul-de-sac biopsy - MESOTHELIAL HYPERPLASIA Microscopic Comment 3. ONCOLOGY TABLE-UTERUS, CARCINOMA OR CARCINOSARCOMA Specimen: Uterus, right fallopian tube and ovary Procedure: Total hysterectomy and right salpingo-oophorectomy Lymph node sampling performed: Bilateral pelvic regional resection Specimen integrity: Intact Maximum tumor size: 3 cm  (polyp) Histologic type: Serous carcinoma Grade: High grade Myometrial invasion: Not identified Cervical stromal involvement: No, focal endocervical gland involvement Extent of involvement of other organs: Not identified Lymph - vascular invasion: Not identified Peritoneal washings: N/A Lymph nodes: Examined: 0 Sentinel 13 Non-sentinel 13 Total Lymph nodes with metastasis: 0 Isolated tumor cells (< 0.2 mm): 0 Micrometastasis: (> 0.2 mm and < 2.0 mm): 0 Macrometastasis: (> 2.0 mm): 0 Extracapsular extension: N/A Pelvic lymph nodes: 0 involved of 13 lymph nodes. Para-aortic lymph nodes: No para-aortic nodes submitted TNM code: pT1a, pNX FIGO Stage (based on pathologic findings, needs clinical correlation): IA Comment: Immunohistochemistry for cytokeratin AE1/AE3 is performed on all of the lymph nodes (parts 1 & 2) and no metastatic carcinoma is identified.    07/04/2017 Genetic Testing    The patient had genetic testing due to a personal history of breast and uterine cancer, and a family history of stomach cancer.  The Multi-Cancer Panel was ordered. The Multi-Cancer Panel offered by [facility] includes sequencing and/or deletion duplication testing of the following 83 genes: ALK, APC, ATM, AXIN2,BAP1,  BARD1, BLM, BMPR1A, BRCA1, BRCA2, BRIP1, CASR, CDC73, CDH1, CDK4, CDKN1B, CDKN1C, CDKN2A (p14ARF), CDKN2A (p16INK4a), CEBPA, CHEK2, CTNNA1, DICER1, DIS3L2, EGFR (c.2369C>T, p.Thr790Met variant only), EPCAM (Deletion/duplication testing only), FH, FLCN, GATA2, GPC3, GREM1 (Promoter region deletion/duplication testing only), HOXB13 (c.251G>A, p.Gly84Glu), HRAS, KIT, MAX, MEN1, MET, MITF (c.952G>A, p.Glu318Lys variant only), MLH1, MSH2, MSH3, MSH6, MUTYH, NBN, NF1, NF2, NTHL1, PALB2, PDGFRA, PHOX2B, PMS2, POLD1, POLE, POT1, PRKAR1A, PTCH1, PTEN, RAD50, RAD51C, RAD51D, RB1, RECQL4, RET, RUNX1, SDHAF2, SDHA (sequence changes only), SDHB, SDHC, SDHD, SMAD4, SMARCA4, SMARCB1, SMARCE1, STK11, SUFU, TERC,  TERT, TMEM127, TP53, TSC1, TSC2, VHL, WRN and WT1.   Results: No pathogenic variants were identified.  A variant of uncertain significance in the gene APC was  identified.  c.791A>G (p.Gln264Arg).  The date of this test report is 07/04/2017.      Genetic Testing    Patient has genetic testing done for MSI. 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     12/03/2017 Imaging    CT Chest/Abd/Pelvis to follow pulmonary nodule and gastric mass IMPRESSION: 1. Stable CT of the chest. Small pulmonary nodules are unchanged when compared with previous exam. 2. No new findings identified. 3. Subcentimeter low-attenuation lesions within the liver are remain too small to characterize but are stable from prior exam. 4. Persistent indeterminate low-attenuation structure within the proximal stomach is unchanged measuring 1.4 cm. Correlation with direct visualization is advised    06/30/2018 Imaging    CT CHEST Lungs/Pleura: Stable scattered sub-cm pulmonary nodules are again seen bilaterally and are stable compared to previous studies. No new or enlarging pulmonary nodules or masses identified. No evidence of pulmonary infiltrate or pleural effusion.    08/19/2018 Echocardiogram    [place] is done in [place]: EF 60-65%. Mild impaired relaxation. (report scanned)    09/17/2018 Relapse/Recurrence    Presented with c/o vagina discharge.  Lesion noted at the left vaginal apex 26m lesion removed Path c/w high grade serous cancer    09/17/2018 Pathology Results    Vagina, biopsy, left apex - HIGH GRADE SEROUS CARCINOMA.    09/28/2018 PET scan    1. Two hypermetabolic axial lymph nodes. Unusual site for metastatic endometrial carcinoma however the activity is more intense than typically seen in reactive adenopathy. Suggest ultrasound-guided percutaneous biopsy of the larger RIGHT axial lymph node 2. No evidence of local recurrence at the vaginal cuff.  3. No metastatic adenopathy in the  abdomen or pelvis. 4. Stable small pulmonary nodules    10/09/2018 Pathology Results    Lymph node, needle/core biopsy, right axilla - METASTATIC CARCINOMA, SEE COMMENT. Microscopic Comment The carcinoma appears high grade. Immunohistochemistry is positive for cytokeratin 7, PAX-8, and ER. Cytokeratin 20, CDX-2, PR, and GATA-3 are negative. The findings along with the history are consistent with a gynecologic primary.     10/09/2018 Procedure    Ultrasound-guided core biopsies of a right axillary lymph node.    10/14/2018 Cancer Staging    Staging form: Corpus Uteri - Carcinoma and Carcinosarcoma, AJCC 8th Edition - Clinical: Stage IVB (cT1, cN0, pM1) - Signed by GHeath Lark MD on 10/14/2018    10/19/2018 Imaging    Placement of single lumen port a cath via right internal jugular vein. The catheter tip lies at the cavo-atrial junction. A power injectable port a cath was placed and is ready for immediate use.     10/21/2018 -  Chemotherapy    The patient had carboplatin and taxol for treatment    11/11/2018 -  Chemotherapy    The patient had trastuzumab ([name]) 483 mg in sodium chloride 0.9 % 250 mL chemo infusion, 8 mg/kg = 483 mg, Intravenous,  Once, 0 of 5 cycles  for chemotherapy treatment.      [place]   10/14/2018 Initial Diagnosis    [place]    10/21/2018 -  Chemotherapy    The patient had palonosetron (ALOXI) injection 0.25 mg, 0.25 mg, Intravenous,  Once, 1 of 6 cycles Administration: 0.25 mg (10/21/2018) CARBOplatin (PARAPLATIN) 310 mg in sodium chloride 0.9 % 250 mL chemo infusion, 310 mg, Intravenous,  Once, 1 of 6 cycles Administration: 310 mg (10/21/2018) PACLitaxel (TAXOL) 222 mg in sodium  chloride 0.9 % 250 mL chemo infusion (> 48m/m2), 135 mg/m2 = 222 mg, Intravenous,  Once, 1 of 6 cycles Administration: 222 mg (10/21/2018) fosaprepitant (EMEND) 150 mg, dexamethasone (DECADRON) 12 mg in sodium chloride 0.9 % 145 mL IVPB, ,  Intravenous,  Once, 1 of 6 cycles Administration:  (10/21/2018)  for chemotherapy treatment.      REVIEW OF SYSTEMS:   Constitutional: Denies fevers, chills or abnormal weight loss Eyes: Denies blurriness of vision Ears, nose, mouth, throat, and face: Denies mucositis or sore throat Respiratory: Denies cough, dyspnea or wheezes Cardiovascular: Denies palpitation, chest discomfort or lower extremity swelling Gastrointestinal:  Denies nausea, heartburn or change in bowel habits Skin: Denies abnormal skin rashes Lymphatics: Denies new lymphadenopathy or easy bruising Neurological:Denies numbness, tingling or new weaknesses Behavioral/Psych: Mood is stable, no new changes  All other systems were reviewed with the patient and are negative.  I have reviewed the past medical history, past surgical history, social history and family history with the patient and they are unchanged from previous note.  ALLERGIES:  has No Known Allergies.  MEDICATIONS:  Current Outpatient Medications  Medication Sig Dispense Refill  . acetaminophen (TYLENOL) 500 MG tablet Take 1,000 mg by mouth every 6 (six) hours as needed.    .Marland Kitchenaspirin EC 81 MG tablet Take 81 mg by mouth daily.    .Marland Kitchenatorvastatin (LIPITOR) 20 MG tablet Take 20 mg by mouth every evening.     . Calcium Carbonate-Vitamin D (CALCIUM-VITAMIN D) 500-200 MG-UNIT per tablet Take 1 tablet by mouth daily.    . citalopram (CELEXA) 20 MG tablet Take 20 mg by mouth at bedtime.     .Marland Kitchendexamethasone (DECADRON) 4 MG tablet Take 2 tabs at the night before and 2 tabs the morning of chemotherapy, every 3 weeks, by mouth 24 tablet 0  . levothyroxine (SYNTHROID, LEVOTHROID) 100 MCG tablet Take 100 mcg by mouth daily before breakfast.     . lidocaine-prilocaine (EMLA) cream Apply to affected area once 30 g 3  . losartan-hydrochlorothiazide (HYZAAR) 100-25 MG tablet     . metoprolol succinate (TOPROL-XL) 25 MG 24 hr tablet Take 25 mg by mouth daily.    . Multiple  Vitamin (MULTIVITAMIN WITH MINERALS) TABS Take 1 tablet by mouth daily.    . ondansetron (ZOFRAN) 8 MG tablet Take 1 tablet (8 mg total) by mouth every 8 (eight) hours as needed. Start on the third day after chemotherapy. 30 tablet 1  . oxyCODONE (OXY IR/ROXICODONE) 5 MG immediate release tablet Take 1 tablet (5 mg total) by mouth every 6 (six) hours as needed for severe pain. 30 tablet 0  . pantoprazole (PROTONIX) 40 MG tablet Take 40 mg by mouth every morning.     . prochlorperazine (COMPAZINE) 10 MG tablet Take 1 tablet (10 mg total) by mouth every 6 (six) hours as needed (Nausea or vomiting). 30 tablet 1  . valACYclovir (VALTREX) 1000 MG tablet valacyclovir 1 gram tablet     No current facility-administered medications for this visit.     PHYSICAL EXAMINATION: ECOG PERFORMANCE STATUS: 1 - Symptomatic but completely ambulatory  Vitals:   11/10/18 1320  BP: (!) 156/70  Pulse: 69  Resp: 18  Temp: 97.8 F (36.6 C)  SpO2: 95%   Filed Weights   11/10/18 1320  Weight: 133 lb 6.4 oz (60.5 kg)    GENERAL:alert, no distress and comfortable SKIN: skin color, texture, turgor are normal, no rashes or significant lesions EYES: normal, Conjunctiva are pink and  non-injected, sclera clear OROPHARYNX:no exudate, no erythema and lips, buccal mucosa, and tongue normal  NECK: supple, thyroid normal size, non-tender, without nodularity LYMPH:  no palpable lymphadenopathy in the cervical, axillary or inguinal LUNGS: clear to auscultation and percussion with normal breathing effort HEART: regular rate & rhythm and no murmurs and no lower extremity edema ABDOMEN:abdomen soft, non-tender and normal bowel sounds Musculoskeletal:no cyanosis of digits and no clubbing  NEURO: alert & oriented x 3 with fluent speech, no focal motor/sensory deficits  LABORATORY DATA:  I have reviewed the data as listed    Component Value Date/Time   NA 136 11/10/2018 1226   NA 139 02/13/2017 1512   K 3.9 11/10/2018  1226   K 4.2 02/13/2017 1512   CL 97 (L) 11/10/2018 1226   CO2 27 11/10/2018 1226   CO2 28 02/13/2017 1512   GLUCOSE 106 (H) 11/10/2018 1226   GLUCOSE 118 02/13/2017 1512   BUN 18 11/10/2018 1226   BUN 25.2 02/13/2017 1512   CREATININE 1.28 (H) 11/10/2018 1226   CREATININE 1.2 (H) 02/13/2017 1512   CALCIUM 9.0 11/10/2018 1226   CALCIUM 10.0 02/13/2017 1512   PROT 7.1 11/10/2018 1226   ALBUMIN 4.0 11/10/2018 1226   AST 23 11/10/2018 1226   ALT 17 11/10/2018 1226   ALKPHOS 93 11/10/2018 1226   BILITOT 0.4 11/10/2018 1226   GFRNONAA 40 (L) 11/10/2018 1226   GFRAA 46 (L) 11/10/2018 1226    No results found for: SPEP, UPEP  Lab Results  Component Value Date   WBC 6.5 11/10/2018   NEUTROABS 4.2 11/10/2018   HGB 11.6 (L) 11/10/2018   HCT 33.4 (L) 11/10/2018   MCV 88.8 11/10/2018   PLT 181 11/10/2018      Chemistry      Component Value Date/Time   NA 136 11/10/2018 1226   NA 139 02/13/2017 1512   K 3.9 11/10/2018 1226   K 4.2 02/13/2017 1512   CL 97 (L) 11/10/2018 1226   CO2 27 11/10/2018 1226   CO2 28 02/13/2017 1512   BUN 18 11/10/2018 1226   BUN 25.2 02/13/2017 1512   CREATININE 1.28 (H) 11/10/2018 1226   CREATININE 1.2 (H) 02/13/2017 1512      Component Value Date/Time   CALCIUM 9.0 11/10/2018 1226   CALCIUM 10.0 02/13/2017 1512   ALKPHOS 93 11/10/2018 1226   AST 23 11/10/2018 1226   ALT 17 11/10/2018 1226   BILITOT 0.4 11/10/2018 1226       RADIOGRAPHIC STUDIES: I have personally reviewed the radiological images as listed and agreed with the findings in the report. Ir Imaging Guided Port Insertion  Result Date: 10/19/2018 CLINICAL DATA:  Recurrent metastatic endometrial carcinoma and need for porta cath for chemotherapy. EXAM: IMPLANTED PORT A CATH PLACEMENT WITH ULTRASOUND AND FLUOROSCOPIC GUIDANCE ANESTHESIA/SEDATION: 3.0 mg IV Versed; 100 mcg IV Fentanyl Total Moderate Sedation Time:  32 minutes The patient's level of consciousness and physiologic  status were continuously monitored during the procedure by Radiology nursing. Additional Medications: 2 g IV Ancef. FLUOROSCOPY TIME:  30 seconds.  3.6 mGy. PROCEDURE: The procedure, risks, benefits, and alternatives were explained to the patient. Questions regarding the procedure were encouraged and answered. The patient understands and consents to the procedure. A time-out was performed prior to initiating the procedure. Ultrasound was utilized to confirm patency of the right internal jugular vein. The right neck and chest were prepped with chlorhexidine in a sterile fashion, and a sterile drape was applied covering the operative  field. Maximum barrier sterile technique with sterile gowns and gloves were used for the procedure. Local anesthesia was provided with 1% lidocaine. After creating a small venotomy incision, a 21 gauge needle was advanced into the right internal jugular vein under direct, real-time ultrasound guidance. Ultrasound image documentation was performed. After securing guidewire access, an 8 Fr dilator was placed. A J-wire was kinked to measure appropriate catheter length. A subcutaneous port pocket was then created along the upper chest wall utilizing sharp and blunt dissection. Portable cautery was utilized. The pocket was irrigated with sterile saline. A single lumen power injectable port was chosen for placement. The 8 Fr catheter was tunneled from the port pocket site to the venotomy incision. The port was placed in the pocket. External catheter was trimmed to appropriate length based on guidewire measurement. At the venotomy, an 8 Fr peel-away sheath was placed over a guidewire. The catheter was then placed through the sheath and the sheath removed. Final catheter positioning was confirmed and documented with a fluoroscopic spot image. The port was accessed with a needle and aspirated and flushed with heparinized saline. The access needle was removed. The venotomy and port pocket incisions  were closed with subcutaneous 3-0 Monocryl and subcuticular 4-0 Vicryl. Dermabond was applied to both incisions. COMPLICATIONS: COMPLICATIONS None FINDINGS: After catheter placement, the tip lies at the cavo-atrial junction. The catheter aspirates normally and is ready for immediate use. IMPRESSION: Placement of single lumen port a cath via right internal jugular vein. The catheter tip lies at the cavo-atrial junction. A power injectable port a cath was placed and is ready for immediate use. Electronically Signed   By: Aletta Edouard M.D.   On: 10/19/2018 15:37    All questions were answered. The patient knows to call the clinic with any problems, questions or concerns. No barriers to learning was detected.  I spent 25 minutes counseling the patient face to face. The total time spent in the appointment was 40 minutes and more than 50% was on counseling and review of test results  [name], MD 11/11/2018 10:27 AM

## 2018-11-12 ENCOUNTER — Telehealth: Payer: Self-pay

## 2018-11-12 NOTE — Telephone Encounter (Signed)
She called and left a message. She forgot to ask at appt. She is having a hard time sleeping and takes Citalopram at night and has started taking 2 tabs help her sleep. She is requesting some thing to help her sleep or advice. She is having some mild facial flushing today from the steroids.

## 2018-11-12 NOTE — Telephone Encounter (Signed)
1) Nothing to be done about facial flushing with steroids. It should resolve over next few days 2) Citalopram generally does not help with sleep. I recommend trial of Tylenol pm OTC or melatonin. Again, likely due to side-effects of steroids. If sleep not improved by next week, call back again and we can try prescription sleep medicine

## 2018-11-12 NOTE — Telephone Encounter (Signed)
Called and given below message. She verbalized understanding. Instructed to call if sleep issues continue.

## 2018-11-30 DIAGNOSIS — L858 Other specified epidermal thickening: Secondary | ICD-10-CM | POA: Diagnosis not present

## 2018-11-30 DIAGNOSIS — Z85828 Personal history of other malignant neoplasm of skin: Secondary | ICD-10-CM | POA: Diagnosis not present

## 2018-12-01 ENCOUNTER — Other Ambulatory Visit: Payer: Self-pay

## 2018-12-01 ENCOUNTER — Inpatient Hospital Stay (HOSPITAL_BASED_OUTPATIENT_CLINIC_OR_DEPARTMENT_OTHER): Payer: Medicare HMO | Admitting: Hematology and Oncology

## 2018-12-01 ENCOUNTER — Inpatient Hospital Stay: Payer: Medicare HMO | Attending: Gynecologic Oncology

## 2018-12-01 VITALS — BP 140/65 | HR 73 | Temp 98.7°F | Resp 18 | Ht 62.5 in | Wt 134.6 lb

## 2018-12-01 DIAGNOSIS — Z79899 Other long term (current) drug therapy: Secondary | ICD-10-CM | POA: Insufficient documentation

## 2018-12-01 DIAGNOSIS — Z7982 Long term (current) use of aspirin: Secondary | ICD-10-CM | POA: Diagnosis not present

## 2018-12-01 DIAGNOSIS — Z853 Personal history of malignant neoplasm of breast: Secondary | ICD-10-CM | POA: Diagnosis not present

## 2018-12-01 DIAGNOSIS — Z90710 Acquired absence of both cervix and uterus: Secondary | ICD-10-CM | POA: Insufficient documentation

## 2018-12-01 DIAGNOSIS — N183 Chronic kidney disease, stage 3 unspecified: Secondary | ICD-10-CM

## 2018-12-01 DIAGNOSIS — Z5112 Encounter for antineoplastic immunotherapy: Secondary | ICD-10-CM | POA: Insufficient documentation

## 2018-12-01 DIAGNOSIS — R918 Other nonspecific abnormal finding of lung field: Secondary | ICD-10-CM | POA: Insufficient documentation

## 2018-12-01 DIAGNOSIS — Z5111 Encounter for antineoplastic chemotherapy: Secondary | ICD-10-CM | POA: Diagnosis not present

## 2018-12-01 DIAGNOSIS — I700 Atherosclerosis of aorta: Secondary | ICD-10-CM

## 2018-12-01 DIAGNOSIS — I2510 Atherosclerotic heart disease of native coronary artery without angina pectoris: Secondary | ICD-10-CM

## 2018-12-01 DIAGNOSIS — Z170 Estrogen receptor positive status [ER+]: Secondary | ICD-10-CM

## 2018-12-01 DIAGNOSIS — C541 Malignant neoplasm of endometrium: Secondary | ICD-10-CM

## 2018-12-01 DIAGNOSIS — Z7189 Other specified counseling: Secondary | ICD-10-CM

## 2018-12-01 DIAGNOSIS — C779 Secondary and unspecified malignant neoplasm of lymph node, unspecified: Secondary | ICD-10-CM | POA: Insufficient documentation

## 2018-12-01 DIAGNOSIS — C773 Secondary and unspecified malignant neoplasm of axilla and upper limb lymph nodes: Secondary | ICD-10-CM

## 2018-12-01 LAB — CBC WITH DIFFERENTIAL (CANCER CENTER ONLY)
Abs Immature Granulocytes: 0.07 10*3/uL (ref 0.00–0.07)
Basophils Absolute: 0.1 10*3/uL (ref 0.0–0.1)
Basophils Relative: 1 %
Eosinophils Absolute: 0.1 10*3/uL (ref 0.0–0.5)
Eosinophils Relative: 2 %
HCT: 30.3 % — ABNORMAL LOW (ref 36.0–46.0)
Hemoglobin: 10.4 g/dL — ABNORMAL LOW (ref 12.0–15.0)
Immature Granulocytes: 1 %
Lymphocytes Relative: 26 %
Lymphs Abs: 1.5 10*3/uL (ref 0.7–4.0)
MCH: 31.8 pg (ref 26.0–34.0)
MCHC: 34.3 g/dL (ref 30.0–36.0)
MCV: 92.7 fL (ref 80.0–100.0)
Monocytes Absolute: 0.6 10*3/uL (ref 0.1–1.0)
Monocytes Relative: 10 %
Neutro Abs: 3.6 10*3/uL (ref 1.7–7.7)
Neutrophils Relative %: 60 %
Platelet Count: 174 10*3/uL (ref 150–400)
RBC: 3.27 MIL/uL — ABNORMAL LOW (ref 3.87–5.11)
RDW: 13.6 % (ref 11.5–15.5)
WBC Count: 6 10*3/uL (ref 4.0–10.5)
nRBC: 0 % (ref 0.0–0.2)

## 2018-12-01 LAB — CMP (CANCER CENTER ONLY)
ALT: 20 U/L (ref 0–44)
AST: 22 U/L (ref 15–41)
Albumin: 3.8 g/dL (ref 3.5–5.0)
Alkaline Phosphatase: 87 U/L (ref 38–126)
Anion gap: 12 (ref 5–15)
BUN: 21 mg/dL (ref 8–23)
CO2: 26 mmol/L (ref 22–32)
Calcium: 8.7 mg/dL — ABNORMAL LOW (ref 8.9–10.3)
Chloride: 103 mmol/L (ref 98–111)
Creatinine: 1.3 mg/dL — ABNORMAL HIGH (ref 0.44–1.00)
GFR, Est AFR Am: 46 mL/min — ABNORMAL LOW (ref >60)
GFR, Estimated: 39 mL/min — ABNORMAL LOW (ref >60)
Glucose, Bld: 101 mg/dL — ABNORMAL HIGH (ref 70–99)
Potassium: 3.4 mmol/L — ABNORMAL LOW (ref 3.5–5.1)
Sodium: 141 mmol/L (ref 135–145)
Total Bilirubin: 0.3 mg/dL (ref 0.3–1.2)
Total Protein: 6.9 g/dL (ref 6.5–8.1)

## 2018-12-02 ENCOUNTER — Encounter: Payer: Self-pay | Admitting: Hematology and Oncology

## 2018-12-02 ENCOUNTER — Inpatient Hospital Stay: Payer: Medicare HMO

## 2018-12-02 ENCOUNTER — Other Ambulatory Visit: Payer: Self-pay

## 2018-12-02 VITALS — BP 156/77 | HR 92 | Temp 98.1°F | Resp 20

## 2018-12-02 DIAGNOSIS — Z5111 Encounter for antineoplastic chemotherapy: Secondary | ICD-10-CM | POA: Diagnosis not present

## 2018-12-02 DIAGNOSIS — C773 Secondary and unspecified malignant neoplasm of axilla and upper limb lymph nodes: Secondary | ICD-10-CM

## 2018-12-02 DIAGNOSIS — R918 Other nonspecific abnormal finding of lung field: Secondary | ICD-10-CM | POA: Diagnosis not present

## 2018-12-02 DIAGNOSIS — N183 Chronic kidney disease, stage 3 (moderate): Secondary | ICD-10-CM | POA: Diagnosis not present

## 2018-12-02 DIAGNOSIS — Z5112 Encounter for antineoplastic immunotherapy: Secondary | ICD-10-CM | POA: Diagnosis not present

## 2018-12-02 DIAGNOSIS — Z90710 Acquired absence of both cervix and uterus: Secondary | ICD-10-CM | POA: Diagnosis not present

## 2018-12-02 DIAGNOSIS — Z7189 Other specified counseling: Secondary | ICD-10-CM

## 2018-12-02 DIAGNOSIS — Z170 Estrogen receptor positive status [ER+]: Secondary | ICD-10-CM | POA: Diagnosis not present

## 2018-12-02 DIAGNOSIS — I2510 Atherosclerotic heart disease of native coronary artery without angina pectoris: Secondary | ICD-10-CM | POA: Diagnosis not present

## 2018-12-02 DIAGNOSIS — C779 Secondary and unspecified malignant neoplasm of lymph node, unspecified: Secondary | ICD-10-CM | POA: Diagnosis not present

## 2018-12-02 DIAGNOSIS — C541 Malignant neoplasm of endometrium: Secondary | ICD-10-CM | POA: Diagnosis not present

## 2018-12-02 DIAGNOSIS — Z853 Personal history of malignant neoplasm of breast: Secondary | ICD-10-CM | POA: Diagnosis not present

## 2018-12-02 MED ORDER — DIPHENHYDRAMINE HCL 50 MG/ML IJ SOLN
50.0000 mg | Freq: Once | INTRAMUSCULAR | Status: AC
  Administered 2018-12-02: 09:00:00 50 mg via INTRAVENOUS

## 2018-12-02 MED ORDER — TRASTUZUMAB CHEMO 150 MG IV SOLR
6.0000 mg/kg | Freq: Once | INTRAVENOUS | Status: AC
  Administered 2018-12-02: 378 mg via INTRAVENOUS
  Filled 2018-12-02: qty 18

## 2018-12-02 MED ORDER — PALONOSETRON HCL INJECTION 0.25 MG/5ML
INTRAVENOUS | Status: AC
  Filled 2018-12-02: qty 5

## 2018-12-02 MED ORDER — ACETAMINOPHEN 325 MG PO TABS
ORAL_TABLET | ORAL | Status: AC
  Filled 2018-12-02: qty 2

## 2018-12-02 MED ORDER — FAMOTIDINE IN NACL 20-0.9 MG/50ML-% IV SOLN
20.0000 mg | Freq: Once | INTRAVENOUS | Status: AC
  Administered 2018-12-02: 20 mg via INTRAVENOUS

## 2018-12-02 MED ORDER — DIPHENHYDRAMINE HCL 50 MG/ML IJ SOLN
INTRAMUSCULAR | Status: AC
  Filled 2018-12-02: qty 1

## 2018-12-02 MED ORDER — HEPARIN SOD (PORK) LOCK FLUSH 100 UNIT/ML IV SOLN
500.0000 [IU] | Freq: Once | INTRAVENOUS | Status: AC | PRN
  Administered 2018-12-02: 500 [IU]
  Filled 2018-12-02: qty 5

## 2018-12-02 MED ORDER — SODIUM CHLORIDE 0.9 % IV SOLN
135.0000 mg/m2 | Freq: Once | INTRAVENOUS | Status: AC
  Administered 2018-12-02: 222 mg via INTRAVENOUS
  Filled 2018-12-02: qty 37

## 2018-12-02 MED ORDER — SODIUM CHLORIDE 0.9% FLUSH
10.0000 mL | INTRAVENOUS | Status: DC | PRN
  Administered 2018-12-02: 15:00:00 10 mL
  Filled 2018-12-02: qty 10

## 2018-12-02 MED ORDER — ACETAMINOPHEN 325 MG PO TABS
650.0000 mg | ORAL_TABLET | Freq: Once | ORAL | Status: AC
  Administered 2018-12-02: 09:00:00 650 mg via ORAL

## 2018-12-02 MED ORDER — FAMOTIDINE IN NACL 20-0.9 MG/50ML-% IV SOLN
INTRAVENOUS | Status: AC
  Filled 2018-12-02: qty 50

## 2018-12-02 MED ORDER — SODIUM CHLORIDE 0.9 % IV SOLN
Freq: Once | INTRAVENOUS | Status: AC
  Administered 2018-12-02: 09:00:00 via INTRAVENOUS
  Filled 2018-12-02: qty 250

## 2018-12-02 MED ORDER — PALONOSETRON HCL INJECTION 0.25 MG/5ML
0.2500 mg | Freq: Once | INTRAVENOUS | Status: AC
  Administered 2018-12-02: 0.25 mg via INTRAVENOUS

## 2018-12-02 MED ORDER — SODIUM CHLORIDE 0.9 % IV SOLN
Freq: Once | INTRAVENOUS | Status: AC
  Administered 2018-12-02: 09:00:00 via INTRAVENOUS
  Filled 2018-12-02: qty 5

## 2018-12-02 MED ORDER — SODIUM CHLORIDE 0.9 % IV SOLN
300.0000 mg | Freq: Once | INTRAVENOUS | Status: AC
  Administered 2018-12-02: 300 mg via INTRAVENOUS
  Filled 2018-12-02: qty 30

## 2018-12-02 NOTE — Assessment & Plan Note (Signed)
So far, she tolerated chemo well without major side effects We will proceed with treatment without delay I will order a PET CT scan to be done before her next cycle of treatment If she has positive response to therapy We will continue 3 more cycles and I will order a follow-up echocardiogram. She agreed with the plan of care

## 2018-12-02 NOTE — Assessment & Plan Note (Signed)
Renal function is stable.  We will continue to adjust dose of her chemotherapy

## 2018-12-02 NOTE — Assessment & Plan Note (Signed)
She is not symptomatic.  We will reassess with PET CT scan

## 2018-12-02 NOTE — Assessment & Plan Note (Signed)
She is not symptomatic.  We will reassess with PET scan

## 2018-12-02 NOTE — Patient Instructions (Signed)
[place] Discharge Instructions for Patients Receiving Chemotherapy  Today you received the following chemotherapy agents Paclitaxel (TAXOL), Carboplatin (PARAPLATIN) & Trastuzumab ([name]).  To help prevent nausea and vomiting after your treatment, we encourage you to take your nausea medication as prescribed.   If you develop nausea and vomiting that is not controlled by your nausea medication, call the clinic.   BELOW ARE SYMPTOMS THAT SHOULD BE REPORTED IMMEDIATELY:  *FEVER GREATER THAN 100.5 F  *CHILLS WITH OR WITHOUT FEVER  NAUSEA AND VOMITING THAT IS NOT CONTROLLED WITH YOUR NAUSEA MEDICATION  *UNUSUAL SHORTNESS OF BREATH  *UNUSUAL BRUISING OR BLEEDING  TENDERNESS IN MOUTH AND THROAT WITH OR WITHOUT PRESENCE OF ULCERS  *URINARY PROBLEMS  *BOWEL PROBLEMS  UNUSUAL RASH Items with * indicate a potential emergency and should be followed up as soon as possible.  Feel free to call the clinic should you have any questions or concerns. The clinic phone number is [phone].  Please show the [place] at check-in to the Emergency Department and triage nurse.  Coronavirus (COVID-19) Are you at risk?  Are you at risk for the Coronavirus (COVID-19)?  To be considered HIGH RISK for Coronavirus (COVID-19), you have to meet the following criteria:  . Traveled to [place], [place], [place], [place] or [place]; or in the [place] to [place], [place], [place], or [place]; and have fever, cough, and shortness of breath within the last 2 weeks of travel OR . Been in close contact with a person diagnosed with COVID-19 within the last 2 weeks and have fever, cough, and shortness of breath . IF YOU DO NOT MEET THESE CRITERIA, YOU ARE CONSIDERED LOW RISK FOR COVID-19.  What to do if you are HIGH RISK for COVID-19?  Marland Kitchen If you are having a medical emergency, call 911. . Seek medical care right away. Before you go to a doctor's office, urgent care or  emergency department, call ahead and tell them about your recent travel, contact with someone diagnosed with COVID-19, and your symptoms. You should receive instructions from your physician's office regarding next steps of care.  . When you arrive at healthcare provider, tell the healthcare staff immediately you have returned from visiting [place], [place], [place], [place] or [place]; or traveled in the [place] to [place], [place], [place], or [place]; in the last two weeks or you have been in close contact with a person diagnosed with COVID-19 in the last 2 weeks.   . Tell the health care staff about your symptoms: fever, cough and shortness of breath. . After you have been seen by a medical provider, you will be either: o Tested for (COVID-19) and discharged home on quarantine except to seek medical care if symptoms worsen, and asked to  - Stay home and avoid contact with others until you get your results (4-5 days)  - Avoid travel on public transportation if possible (such as bus, train, or airplane) or o Sent to the Emergency Department by EMS for evaluation, COVID-19 testing, and possible admission depending on your condition and test results.  What to do if you are LOW RISK for COVID-19?  Reduce your risk of any infection by using the same precautions used for avoiding the common cold or flu:  Marland Kitchen Wash your hands often with soap and warm water for at least 20 seconds.  If soap and water are not readily available, use an alcohol-based hand sanitizer with at least 60% alcohol.  Marland Kitchen  If coughing or sneezing, cover your mouth and nose by coughing or sneezing into the elbow areas of your shirt or coat, into a tissue or into your sleeve (not your hands). . Avoid shaking hands with others and consider head nods or verbal greetings only. . Avoid touching your eyes, nose, or mouth with unwashed hands.  . Avoid close contact with people who are sick. . Avoid places or events with large numbers  of people in one location, like concerts or sporting events. . Carefully consider travel plans you have or are making. . If you are planning any travel outside or inside the [place], visit the CDC's Travelers' Health webpage for the latest health notices. . If you have some symptoms but not all symptoms, continue to monitor at home and seek medical attention if your symptoms worsen. . If you are having a medical emergency, call 911.   [place] / e-Visit: [email or web address]         [facility]: [facility]: [phone]                   [facility]: [phone]

## 2018-12-02 NOTE — Progress Notes (Signed)
[place] OFFICE PROGRESS NOTE  Patient Care Team: Burnard Bunting, [name]  ASSESSMENT & PLAN:  Endometrial cancer [facility]) So far, she tolerated chemo well without major side effects We will proceed with treatment without delay I will order a PET CT scan to be done before her next cycle of treatment If she has positive response to therapy We will continue 3 more cycles and I will order a follow-up echocardiogram. She agreed with the plan of care  Pulmonary nodules/lesions, multiple She is not symptomatic.  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  We will reassess with PET scan  CKD (chronic kidney disease), stage III ([place]) Renal function is stable.  We will continue to adjust dose of her chemotherapy   Orders Placed This Encounter  Procedures  . NM PET Image Restag (PS) Skull Base To Thigh    Standing Status:   Future    Standing Expiration Date:   12/02/2019    Order Specific Question:   If indicated for the ordered procedure, I authorize the administration of a radiopharmaceutical per Radiology protocol    Answer:   Yes    Order Specific Question:   Preferred imaging location?    Answer:   [facility]    Order Specific Question:   Radiology Contrast Protocol - do NOT remove file path    Answer:   [email or web address]    INTERVAL HISTORY: Please see below for problem oriented charting. 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 Her cancer was treated with surgery but no radiation or chemotherapy.    Serous endometrial cancer, MSI stable ER positive, PR neg, Her2/neu 3+        Endometrial cancer ([place])   08/29/2016 Pathology Results    Endometrium, biopsy - HIGH GRADE ENDOMETRIAL CARCINOMA, SEE COMMENT. Microscopic Comment The sections show multiple fragments of adenocarcinoma displaying glandular and papillary patterns associated with high grade cytomorphology characterized by nuclear pleomorphism, prominent nucleoli and brisk mitosis. Immunohistochemical stains show that the tumor cells are positive for vimentin, p16, p53 with increased Ki-67 expression. Estrogen and progesterone receptor stains show patchy weak positivity. No significant positivity is seen with CEA. The findings are consistent with high grade endometrial carcinoma and the overall morphology and phenotypic features favor serous carcinoma.    08/29/2016 Initial Diagnosis    She presented with postmenopausal bleeding    10/03/2016 Imaging    CT C/A/P 09/2016 IMPRESSION: 1. Thickening of the endometrial canal up to 19 mm in fundus, presumably corresponding to the patient's reported endometrial carcinoma. 2. Multiple tiny pulmonary nodules scattered throughout the lungs bilaterally measuring 4 mm or less in size. Nodules of this size are typically considered statistically likely benign. In the setting of known primary malignancy, metastatic disease to the lungs is not excluded, but is not strongly favored on today's examination. Attention on followup studies is recommended to ensure the stability or resolution of these nodules. 3. Subcentimeter low-attenuation lesion in the central aspect of segment 8 of the liver is too small to characterize. This is statistically likely a  tiny cyst, but warrants attention on follow-up studies to exclude the possibility of a solitary hepatic metastasis. 4. 1.5 x 1.5 x 1.7 cm well-circumscribed lesion in the proximal stomach. This is of uncertain etiology and significance, and could represent  a benign gastric polyp, however, further evaluation with nonemergent endoscopy is suggested in the near future for further evaluation. 5. **An incidental finding of potential clinical significance has been found. 1.1 x 1.6 cm thyroid nodule in the inferior aspect of the right lobe of the thyroid gland. Follow-up evaluation with nonemergent thyroid ultrasound is recommended in the near future to better evaluate this finding. This recommendation follows ACR consensuss guidelines: Managing Incidental Thyroid Nodules Detected on Imaging: White Paper of the ACR Incidental Thyroid Findings Committee. J Am Coll Radiol 2015;12(2):143-150.** 6. Aortic atherosclerosis, in addition to left anterior descending coronary artery disease    10/22/2016 Surgery    Robotic assisted total hysterectomy, BSO and bilateral pelvic lymphadenectomy  Final pathology revealed a 3cm polyp containing serous carcinoma but with no myometrial invasion, no LVSI and negative nodes.  Stage IA Uterine serous cancer    10/22/2016 Pathology Results    1. Lymph nodes, regional resection, right pelvic - SIX BENIGN LYMPH NODES (0/6). 2. Lymph nodes, regional resection, left pelvic - SEVEN BENIGN LYMPH NODES (0/7). 3. Uterus +/- tubes/ovaries, neoplastic, with right ovary and fallopian tube ENDOMYOMETRIUM - SEROUS CARCINOMA ARISING WITHIN AN ENDOMETRIAL POLYP - NO MYOMETRIAL INVASION IDENTIFIED - ADENOMYOSIS - LEIOMYOMA (1 CM) - SEE ONCOLOGY TABLE AND COMMENT CERVIX - CARCINOMA FOCALLY INVOLVES ENDOCERVICAL GLANDS - NABOTHIAN CYSTS RIGHT ADNEXA - BENIGN OVARY AND FALLOPIAN TUBE - NO CARCINOMA IDENTIFIED 4. Cul-de-sac biopsy - MESOTHELIAL HYPERPLASIA Microscopic Comment 3. ONCOLOGY TABLE-UTERUS, CARCINOMA OR CARCINOSARCOMA Specimen: Uterus, right fallopian tube and ovary Procedure: Total hysterectomy and right salpingo-oophorectomy Lymph node sampling performed: Bilateral pelvic regional resection Specimen integrity:  Intact Maximum tumor size: 3 cm (polyp) Histologic type: Serous carcinoma Grade: High grade Myometrial invasion: Not identified Cervical stromal involvement: No, focal endocervical gland involvement Extent of involvement of other organs: Not identified Lymph - vascular invasion: Not identified Peritoneal washings: N/A Lymph nodes: Examined: 0 Sentinel 13 Non-sentinel 13 Total Lymph nodes with metastasis: 0 Isolated tumor cells (< 0.2 mm): 0 Micrometastasis: (> 0.2 mm and < 2.0 mm): 0 Macrometastasis: (> 2.0 mm): 0 Extracapsular extension: N/A Pelvic lymph nodes: 0 involved of 13 lymph nodes. Para-aortic lymph nodes: No para-aortic nodes submitted TNM code: pT1a, pNX FIGO Stage (based on pathologic findings, needs clinical correlation): IA Comment: Immunohistochemistry for cytokeratin AE1/AE3 is performed on all of the lymph nodes (parts 1 & 2) and no metastatic carcinoma is identified.    07/04/2017 Genetic Testing    The patient had genetic testing due to a personal history of breast and uterine cancer, and a family history of stomach cancer.  The Multi-Cancer Panel was ordered. The Multi-Cancer Panel offered by [facility] includes sequencing and/or deletion duplication testing of the following 83 genes: ALK, APC, ATM, AXIN2,BAP1,  BARD1, BLM, BMPR1A, BRCA1, BRCA2, BRIP1, CASR, CDC73, CDH1, CDK4, CDKN1B, CDKN1C, CDKN2A (p14ARF), CDKN2A (p16INK4a), CEBPA, CHEK2, CTNNA1, DICER1, DIS3L2, EGFR (c.2369C>T, p.Thr790Met variant only), EPCAM (Deletion/duplication testing only), FH, FLCN, GATA2, GPC3, GREM1 (Promoter region deletion/duplication testing only), HOXB13 (c.251G>A, p.Gly84Glu), HRAS, KIT, MAX, MEN1, MET, MITF (c.952G>A, p.Glu318Lys variant only), MLH1, MSH2, MSH3, MSH6, MUTYH, NBN, NF1, NF2, NTHL1, PALB2, PDGFRA, PHOX2B, PMS2, POLD1, POLE, POT1, PRKAR1A, PTCH1, PTEN, RAD50, RAD51C, RAD51D, RB1, RECQL4, RET, RUNX1, SDHAF2, SDHA (sequence changes only), SDHB, SDHC, SDHD,  SMAD4, SMARCA4,  SMARCB1, SMARCE1, STK11, SUFU, TERC, TERT, TMEM127, TP53, TSC1, TSC2, VHL, WRN and WT1.   Results: No pathogenic variants were identified.  A variant of uncertain significance in the gene APC was identified.  c.791A>G (p.Gln264Arg).  The date of this test report is 07/04/2017.      Genetic Testing    Patient has genetic testing done for MSI. 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     12/03/2017 Imaging    CT Chest/Abd/Pelvis to follow pulmonary nodule and gastric mass IMPRESSION: 1. Stable CT of the chest. Small pulmonary nodules are unchanged when compared with previous exam. 2. No new findings identified. 3. Subcentimeter low-attenuation lesions within the liver are remain too small to characterize but are stable from prior exam. 4. Persistent indeterminate low-attenuation structure within the proximal stomach is unchanged measuring 1.4 cm. Correlation with direct visualization is advised    06/30/2018 Imaging    CT CHEST Lungs/Pleura: Stable scattered sub-cm pulmonary nodules are again seen bilaterally and are stable compared to previous studies. No new or enlarging pulmonary nodules or masses identified. No evidence of pulmonary infiltrate or pleural effusion.    08/19/2018 Echocardiogram    [place] done in [place]: EF 60-65%. Mild impaired relaxation. (report scanned)    09/17/2018 Relapse/Recurrence    Presented with c/o vagina discharge.  Lesion noted at the left vaginal apex 98m lesion removed Path c/w high grade serous cancer    09/17/2018 Pathology Results    Vagina, biopsy, left apex - HIGH GRADE SEROUS CARCINOMA.    09/28/2018 PET scan    1. Two hypermetabolic axial lymph nodes. Unusual site for metastatic endometrial carcinoma however the activity is more intense than typically seen in reactive adenopathy. Suggest ultrasound-guided percutaneous biopsy of the larger RIGHT axial lymph node 2. No evidence of local recurrence at the vaginal cuff.   3. No metastatic adenopathy in the abdomen or pelvis. 4. Stable small pulmonary nodules    10/09/2018 Pathology Results    Lymph node, needle/core biopsy, right axilla - METASTATIC CARCINOMA, SEE COMMENT. Microscopic Comment The carcinoma appears high grade. Immunohistochemistry is positive for cytokeratin 7, PAX-8, and ER. Cytokeratin 20, CDX-2, PR, and GATA-3 are negative. The findings along with the history are consistent with a gynecologic primary.     10/09/2018 Procedure    Ultrasound-guided core biopsies of a right axillary lymph node.    10/14/2018 Cancer Staging    Staging form: Corpus Uteri - Carcinoma and Carcinosarcoma, AJCC 8th Edition - Clinical: Stage IVB (cT1, cN0, pM1) - Signed by GHeath Lark MD on 10/14/2018    10/19/2018 Imaging    Placement of single lumen port a cath via right internal jugular vein. The catheter tip lies at the cavo-atrial junction. A power injectable port a cath was placed and is ready for immediate use.     10/21/2018 -  Chemotherapy    The patient had carboplatin and taxol for treatment    11/11/2018 -  Chemotherapy    The patient had trastuzumab ([name]) 450 mg in sodium chloride 0.9 % 250 mL chemo infusion, 483 mg, Intravenous,  Once, 2 of 5 cycles Administration: 450 mg (11/11/2018)  for chemotherapy treatment.      [place]   10/14/2018 Initial Diagnosis    [place]    10/21/2018 -  Chemotherapy    The patient had palonosetron (ALOXI) injection 0.25 mg, 0.25 mg, Intravenous,  Once, 3 of 6  cycles Administration: 0.25 mg (10/21/2018), 0.25 mg (11/11/2018) CARBOplatin (PARAPLATIN) 310 mg in sodium chloride 0.9 % 250 mL chemo infusion, 310 mg, Intravenous,  Once, 3 of 6 cycles Administration: 310 mg (10/21/2018), 300 mg (11/11/2018) PACLitaxel (TAXOL) 222 mg in sodium chloride 0.9 % 250 mL chemo infusion (> 79m/m2), 135 mg/m2 = 222 mg, Intravenous,  Once, 3 of 6 cycles Administration: 222 mg (10/21/2018), 222  mg (11/11/2018) fosaprepitant (EMEND) 150 mg, dexamethasone (DECADRON) 12 mg in sodium chloride 0.9 % 145 mL IVPB, , Intravenous,  Once, 3 of 6 cycles Administration:  (10/21/2018),  (11/11/2018)  for chemotherapy treatment.      REVIEW OF SYSTEMS:   Constitutional: Denies fevers, chills or abnormal weight loss Eyes: Denies blurriness of vision Ears, nose, mouth, throat, and face: Denies mucositis or sore throat Respiratory: Denies cough, dyspnea or wheezes Cardiovascular: Denies palpitation, chest discomfort or lower extremity swelling Gastrointestinal:  Denies nausea, heartburn or change in bowel habits Skin: Denies abnormal skin rashes Lymphatics: Denies new lymphadenopathy or easy bruising Neurological:Denies numbness, tingling or new weaknesses Behavioral/Psych: Mood is stable, no new changes  All other systems were reviewed with the patient and are negative.  I have reviewed the past medical history, past surgical history, social history and family history with the patient and they are unchanged from previous note.  ALLERGIES:  has No Known Allergies.  MEDICATIONS:  Current Outpatient Medications  Medication Sig Dispense Refill  . acetaminophen (TYLENOL) 500 MG tablet Take 1,000 mg by mouth every 6 (six) hours as needed.    .Marland Kitchenaspirin EC 81 MG tablet Take 81 mg by mouth daily.    .Marland Kitchenatorvastatin (LIPITOR) 20 MG tablet Take 20 mg by mouth every evening.     . Calcium Carbonate-Vitamin D (CALCIUM-VITAMIN D) 500-200 MG-UNIT per tablet Take 1 tablet by mouth daily.    . citalopram (CELEXA) 20 MG tablet Take 20 mg by mouth at bedtime.     .Marland Kitchendexamethasone (DECADRON) 4 MG tablet Take 2 tabs at the night before and 2 tabs the morning of chemotherapy, every 3 weeks, by mouth 24 tablet 0  . levothyroxine (SYNTHROID, LEVOTHROID) 100 MCG tablet Take 100 mcg by mouth daily before breakfast.     . lidocaine-prilocaine (EMLA) cream Apply to affected area once 30 g 3  .  losartan-hydrochlorothiazide (HYZAAR) 100-25 MG tablet     . metoprolol succinate (TOPROL-XL) 25 MG 24 hr tablet Take 25 mg by mouth daily.    . Multiple Vitamin (MULTIVITAMIN WITH MINERALS) TABS Take 1 tablet by mouth daily.    . ondansetron (ZOFRAN) 8 MG tablet Take 1 tablet (8 mg total) by mouth every 8 (eight) hours as needed. Start on the third day after chemotherapy. 30 tablet 1  . oxyCODONE (OXY IR/ROXICODONE) 5 MG immediate release tablet Take 1 tablet (5 mg total) by mouth every 6 (six) hours as needed for severe pain. 30 tablet 0  . pantoprazole (PROTONIX) 40 MG tablet Take 40 mg by mouth every morning.     . prochlorperazine (COMPAZINE) 10 MG tablet Take 1 tablet (10 mg total) by mouth every 6 (six) hours as needed (Nausea or vomiting). 30 tablet 1  . valACYclovir (VALTREX) 1000 MG tablet valacyclovir 1 gram tablet     No current facility-administered medications for this visit.    Facility-Administered Medications Ordered in Other Visits  Medication Dose Route Frequency Provider Last Rate Last Dose  . CARBOplatin (PARAPLATIN) 300 mg in sodium chloride 0.9 % 250 mL chemo infusion  300 mg Intravenous Once Alvy Bimler, Shontia Gillooly, MD      . heparin lock flush 100 unit/mL  500 Units Intracatheter Once PRN Alvy Bimler, Mackenzy Grumbine, MD      . PACLitaxel (TAXOL) 222 mg in sodium chloride 0.9 % 250 mL chemo infusion (> 42m/m2)  135 mg/m2 (Treatment Plan Recorded) Intravenous Once GAlvy Bimler Xitlalic Maslin, MD 96 mL/hr at 12/02/18 1054 222 mg at 12/02/18 1054  . sodium chloride flush (NS) 0.9 % injection 10 mL  10 mL Intracatheter PRN GAlvy Bimler Zykee Avakian, MD        PHYSICAL EXAMINATION: ECOG PERFORMANCE STATUS: 1 - Symptomatic but completely ambulatory  Vitals:   12/01/18 0839  BP: 140/65  Pulse: 73  Resp: 18  Temp: 98.7 F (37.1 C)  SpO2: 96%   Filed Weights   12/01/18 0839  Weight: 134 lb 9.6 oz (61.1 kg)    GENERAL:alert, no distress and comfortable SKIN: skin color, texture, turgor are normal, no rashes or significant  lesions EYES: normal, Conjunctiva are pink and non-injected, sclera clear OROPHARYNX:no exudate, no erythema and lips, buccal mucosa, and tongue normal  NECK: supple, thyroid normal size, non-tender, without nodularity LYMPH:  no palpable lymphadenopathy in the cervical, axillary or inguinal LUNGS: clear to auscultation and percussion with normal breathing effort HEART: regular rate & rhythm and no murmurs and no lower extremity edema ABDOMEN:abdomen soft, non-tender and normal bowel sounds Musculoskeletal:no cyanosis of digits and no clubbing  NEURO: alert & oriented x 3 with fluent speech, no focal motor/sensory deficits  LABORATORY DATA:  I have reviewed the data as listed    Component Value Date/Time   NA 141 12/01/2018 0824   NA 139 02/13/2017 1512   K 3.4 (L) 12/01/2018 0824   K 4.2 02/13/2017 1512   CL 103 12/01/2018 0824   CO2 26 12/01/2018 0824   CO2 28 02/13/2017 1512   GLUCOSE 101 (H) 12/01/2018 0824   GLUCOSE 118 02/13/2017 1512   BUN 21 12/01/2018 0824   BUN 25.2 02/13/2017 1512   CREATININE 1.30 (H) 12/01/2018 0824   CREATININE 1.2 (H) 02/13/2017 1512   CALCIUM 8.7 (L) 12/01/2018 0824   CALCIUM 10.0 02/13/2017 1512   PROT 6.9 12/01/2018 0824   ALBUMIN 3.8 12/01/2018 0824   AST 22 12/01/2018 0824   ALT 20 12/01/2018 0824   ALKPHOS 87 12/01/2018 0824   BILITOT 0.3 12/01/2018 0824   GFRNONAA 39 (L) 12/01/2018 0824   GFRAA 46 (L) 12/01/2018 0824    No results found for: SPEP, UPEP  Lab Results  Component Value Date   WBC 6.0 12/01/2018   NEUTROABS 3.6 12/01/2018   HGB 10.4 (L) 12/01/2018   HCT 30.3 (L) 12/01/2018   MCV 92.7 12/01/2018   PLT 174 12/01/2018      Chemistry      Component Value Date/Time   NA 141 12/01/2018 0824   NA 139 02/13/2017 1512   K 3.4 (L) 12/01/2018 0824   K 4.2 02/13/2017 1512   CL 103 12/01/2018 0824   CO2 26 12/01/2018 0824   CO2 28 02/13/2017 1512   BUN 21 12/01/2018 0824   BUN 25.2 02/13/2017 1512   CREATININE 1.30  (H) 12/01/2018 0824   CREATININE 1.2 (H) 02/13/2017 1512      Component Value Date/Time   CALCIUM 8.7 (L) 12/01/2018 0824   CALCIUM 10.0 02/13/2017 1512   ALKPHOS 87 12/01/2018 0824   AST 22 12/01/2018 0824   ALT 20 12/01/2018 0824   BILITOT 0.3 12/01/2018 0824  All questions were answered. The patient knows to call the clinic with any problems, questions or concerns. No barriers to learning was detected.  I spent 20 minutes counseling the patient face to face. The total time spent in the appointment was 30 minutes and more than 50% was on counseling and review of test results  [name], MD 12/02/2018 12:37 PM

## 2018-12-16 DIAGNOSIS — C7800 Secondary malignant neoplasm of unspecified lung: Secondary | ICD-10-CM | POA: Diagnosis not present

## 2018-12-16 DIAGNOSIS — Z5111 Encounter for antineoplastic chemotherapy: Secondary | ICD-10-CM | POA: Diagnosis not present

## 2018-12-16 DIAGNOSIS — E785 Hyperlipidemia, unspecified: Secondary | ICD-10-CM | POA: Diagnosis not present

## 2018-12-16 DIAGNOSIS — E041 Nontoxic single thyroid nodule: Secondary | ICD-10-CM | POA: Diagnosis not present

## 2018-12-16 DIAGNOSIS — C541 Malignant neoplasm of endometrium: Secondary | ICD-10-CM | POA: Diagnosis not present

## 2018-12-16 DIAGNOSIS — M5126 Other intervertebral disc displacement, lumbar region: Secondary | ICD-10-CM | POA: Diagnosis not present

## 2018-12-16 DIAGNOSIS — Z1331 Encounter for screening for depression: Secondary | ICD-10-CM | POA: Diagnosis not present

## 2018-12-16 DIAGNOSIS — E039 Hypothyroidism, unspecified: Secondary | ICD-10-CM | POA: Diagnosis not present

## 2018-12-16 DIAGNOSIS — D849 Immunodeficiency, unspecified: Secondary | ICD-10-CM | POA: Diagnosis not present

## 2018-12-16 DIAGNOSIS — C50919 Malignant neoplasm of unspecified site of unspecified female breast: Secondary | ICD-10-CM | POA: Diagnosis not present

## 2018-12-16 DIAGNOSIS — I10 Essential (primary) hypertension: Secondary | ICD-10-CM | POA: Diagnosis not present

## 2018-12-21 ENCOUNTER — Telehealth: Payer: Self-pay | Admitting: *Deleted

## 2018-12-21 ENCOUNTER — Inpatient Hospital Stay: Payer: Medicare HMO | Attending: Gynecologic Oncology

## 2018-12-21 ENCOUNTER — Ambulatory Visit (HOSPITAL_COMMUNITY)
Admission: RE | Admit: 2018-12-21 | Discharge: 2018-12-21 | Disposition: A | Discharge status: Home / Self Care | Payer: Medicare HMO | Source: Ambulatory Visit | Attending: Hematology and Oncology | Admitting: Hematology and Oncology

## 2018-12-21 ENCOUNTER — Other Ambulatory Visit: Payer: Self-pay

## 2018-12-21 DIAGNOSIS — Z90710 Acquired absence of both cervix and uterus: Secondary | ICD-10-CM | POA: Insufficient documentation

## 2018-12-21 DIAGNOSIS — N183 Chronic kidney disease, stage 3 (moderate): Secondary | ICD-10-CM | POA: Insufficient documentation

## 2018-12-21 DIAGNOSIS — Z5112 Encounter for antineoplastic immunotherapy: Secondary | ICD-10-CM | POA: Diagnosis present

## 2018-12-21 DIAGNOSIS — D630 Anemia in neoplastic disease: Secondary | ICD-10-CM | POA: Diagnosis not present

## 2018-12-21 DIAGNOSIS — K5730 Diverticulosis of large intestine without perforation or abscess without bleeding: Secondary | ICD-10-CM | POA: Insufficient documentation

## 2018-12-21 DIAGNOSIS — I700 Atherosclerosis of aorta: Secondary | ICD-10-CM | POA: Insufficient documentation

## 2018-12-21 DIAGNOSIS — C773 Secondary and unspecified malignant neoplasm of axilla and upper limb lymph nodes: Secondary | ICD-10-CM | POA: Diagnosis not present

## 2018-12-21 DIAGNOSIS — Z79899 Other long term (current) drug therapy: Secondary | ICD-10-CM | POA: Insufficient documentation

## 2018-12-21 DIAGNOSIS — C779 Secondary and unspecified malignant neoplasm of lymph node, unspecified: Secondary | ICD-10-CM | POA: Diagnosis not present

## 2018-12-21 DIAGNOSIS — C541 Malignant neoplasm of endometrium: Secondary | ICD-10-CM

## 2018-12-21 DIAGNOSIS — R918 Other nonspecific abnormal finding of lung field: Secondary | ICD-10-CM | POA: Insufficient documentation

## 2018-12-21 DIAGNOSIS — Z7982 Long term (current) use of aspirin: Secondary | ICD-10-CM | POA: Insufficient documentation

## 2018-12-21 DIAGNOSIS — I2510 Atherosclerotic heart disease of native coronary artery without angina pectoris: Secondary | ICD-10-CM | POA: Insufficient documentation

## 2018-12-21 DIAGNOSIS — Z5111 Encounter for antineoplastic chemotherapy: Secondary | ICD-10-CM | POA: Insufficient documentation

## 2018-12-21 DIAGNOSIS — Z7189 Other specified counseling: Secondary | ICD-10-CM

## 2018-12-21 LAB — CBC WITH DIFFERENTIAL (CANCER CENTER ONLY)
Abs Immature Granulocytes: 0.05 10*3/uL (ref 0.00–0.07)
Basophils Absolute: 0.1 10*3/uL (ref 0.0–0.1)
Basophils Relative: 1 %
Eosinophils Absolute: 0.1 10*3/uL (ref 0.0–0.5)
Eosinophils Relative: 2 %
HCT: 27.2 % — ABNORMAL LOW (ref 36.0–46.0)
Hemoglobin: 9.3 g/dL — ABNORMAL LOW (ref 12.0–15.0)
Immature Granulocytes: 1 %
Lymphocytes Relative: 29 %
Lymphs Abs: 1.4 10*3/uL (ref 0.7–4.0)
MCH: 32.3 pg (ref 26.0–34.0)
MCHC: 34.2 g/dL (ref 30.0–36.0)
MCV: 94.4 fL (ref 80.0–100.0)
Monocytes Absolute: 0.6 10*3/uL (ref 0.1–1.0)
Monocytes Relative: 12 %
Neutro Abs: 2.6 10*3/uL (ref 1.7–7.7)
Neutrophils Relative %: 55 %
Platelet Count: 155 10*3/uL (ref 150–400)
RBC: 2.88 MIL/uL — ABNORMAL LOW (ref 3.87–5.11)
RDW: 14.4 % (ref 11.5–15.5)
WBC Count: 4.7 10*3/uL (ref 4.0–10.5)
nRBC: 0 % (ref 0.0–0.2)

## 2018-12-21 LAB — CMP (CANCER CENTER ONLY)
ALT: 15 U/L (ref 0–44)
AST: 21 U/L (ref 15–41)
Albumin: 3.7 g/dL (ref 3.5–5.0)
Alkaline Phosphatase: 88 U/L (ref 38–126)
Anion gap: 14 (ref 5–15)
BUN: 23 mg/dL (ref 8–23)
CO2: 24 mmol/L (ref 22–32)
Calcium: 8.1 mg/dL — ABNORMAL LOW (ref 8.9–10.3)
Chloride: 102 mmol/L (ref 98–111)
Creatinine: 1.25 mg/dL — ABNORMAL HIGH (ref 0.44–1.00)
GFR, Est AFR Am: 48 mL/min — ABNORMAL LOW (ref >60)
GFR, Estimated: 41 mL/min — ABNORMAL LOW (ref >60)
Glucose, Bld: 106 mg/dL — ABNORMAL HIGH (ref 70–99)
Potassium: 3.5 mmol/L (ref 3.5–5.1)
Sodium: 140 mmol/L (ref 135–145)
Total Bilirubin: 0.3 mg/dL (ref 0.3–1.2)
Total Protein: 6.8 g/dL (ref 6.5–8.1)

## 2018-12-21 LAB — GLUCOSE, CAPILLARY: Glucose-Capillary: 104 mg/dL — ABNORMAL HIGH (ref 70–99)

## 2018-12-21 MED ORDER — FLUDEOXYGLUCOSE F - 18 (FDG) INJECTION
6.6900 | Freq: Once | INTRAVENOUS | Status: AC | PRN
  Administered 2018-12-21: 6.69 via INTRAVENOUS

## 2018-12-21 NOTE — Telephone Encounter (Signed)
MR faxed to [employer] - Release:  [id number]

## 2018-12-22 DIAGNOSIS — D630 Anemia in neoplastic disease: Secondary | ICD-10-CM | POA: Insufficient documentation

## 2018-12-22 NOTE — Assessment & Plan Note (Signed)
The axillary lymphadenopathy has resolved but she have new lymphadenopathy near the groin region She is not symptomatic Observe only for now with repeat imaging study in a few months

## 2018-12-22 NOTE — Assessment & Plan Note (Signed)
I have reviewed imaging studies with the patient She has good response to therapy and overall tolerated treatment well except for mild progressive anemia We will proceed with chemotherapy as scheduled I will order repeat echocardiogram to follow. I plan to repeat PET CT scan again in 3 to 6 months.

## 2018-12-22 NOTE — Assessment & Plan Note (Signed)
The pulmonary nodules are stable We will continue to observe with serial imaging study.

## 2018-12-22 NOTE — Assessment & Plan Note (Signed)

## 2018-12-22 NOTE — Assessment & Plan Note (Signed)
Renal function is stable.  We will continue to adjust dose of her chemotherapy

## 2018-12-23 ENCOUNTER — Other Ambulatory Visit: Payer: Self-pay | Admitting: Hematology and Oncology

## 2018-12-23 ENCOUNTER — Inpatient Hospital Stay (HOSPITAL_BASED_OUTPATIENT_CLINIC_OR_DEPARTMENT_OTHER): Payer: Medicare HMO | Admitting: Hematology and Oncology

## 2018-12-23 ENCOUNTER — Encounter: Payer: Self-pay | Admitting: Hematology and Oncology

## 2018-12-23 ENCOUNTER — Other Ambulatory Visit: Payer: Self-pay

## 2018-12-23 ENCOUNTER — Telehealth: Payer: Self-pay

## 2018-12-23 ENCOUNTER — Inpatient Hospital Stay: Payer: Medicare HMO

## 2018-12-23 VITALS — HR 64

## 2018-12-23 VITALS — BP 149/82 | HR 101 | Temp 98.0°F | Resp 16 | Ht 62.5 in | Wt 134.4 lb

## 2018-12-23 DIAGNOSIS — C541 Malignant neoplasm of endometrium: Secondary | ICD-10-CM

## 2018-12-23 DIAGNOSIS — D630 Anemia in neoplastic disease: Secondary | ICD-10-CM | POA: Diagnosis not present

## 2018-12-23 DIAGNOSIS — C773 Secondary and unspecified malignant neoplasm of axilla and upper limb lymph nodes: Secondary | ICD-10-CM

## 2018-12-23 DIAGNOSIS — N183 Chronic kidney disease, stage 3 unspecified: Secondary | ICD-10-CM

## 2018-12-23 DIAGNOSIS — R918 Other nonspecific abnormal finding of lung field: Secondary | ICD-10-CM | POA: Diagnosis not present

## 2018-12-23 DIAGNOSIS — Z7982 Long term (current) use of aspirin: Secondary | ICD-10-CM

## 2018-12-23 DIAGNOSIS — Z5112 Encounter for antineoplastic immunotherapy: Secondary | ICD-10-CM | POA: Diagnosis not present

## 2018-12-23 DIAGNOSIS — Z79899 Other long term (current) drug therapy: Secondary | ICD-10-CM

## 2018-12-23 DIAGNOSIS — Z7189 Other specified counseling: Secondary | ICD-10-CM

## 2018-12-23 DIAGNOSIS — Z5111 Encounter for antineoplastic chemotherapy: Secondary | ICD-10-CM | POA: Diagnosis not present

## 2018-12-23 DIAGNOSIS — Z90710 Acquired absence of both cervix and uterus: Secondary | ICD-10-CM

## 2018-12-23 MED ORDER — FAMOTIDINE IN NACL 20-0.9 MG/50ML-% IV SOLN
INTRAVENOUS | Status: AC
  Filled 2018-12-23: qty 50

## 2018-12-23 MED ORDER — PALONOSETRON HCL INJECTION 0.25 MG/5ML
0.2500 mg | Freq: Once | INTRAVENOUS | Status: AC
  Administered 2018-12-23: 09:00:00 0.25 mg via INTRAVENOUS

## 2018-12-23 MED ORDER — DIPHENHYDRAMINE HCL 50 MG/ML IJ SOLN
50.0000 mg | Freq: Once | INTRAMUSCULAR | Status: AC
  Administered 2018-12-23: 09:00:00 50 mg via INTRAVENOUS

## 2018-12-23 MED ORDER — SODIUM CHLORIDE 0.9 % IV SOLN
Freq: Once | INTRAVENOUS | Status: AC
  Filled 2018-12-23: qty 250

## 2018-12-23 MED ORDER — FAMOTIDINE IN NACL 20-0.9 MG/50ML-% IV SOLN
20.0000 mg | Freq: Once | INTRAVENOUS | Status: AC
  Administered 2018-12-23: 20 mg via INTRAVENOUS

## 2018-12-23 MED ORDER — PALONOSETRON HCL INJECTION 0.25 MG/5ML
INTRAVENOUS | Status: AC
  Filled 2018-12-23: qty 5

## 2018-12-23 MED ORDER — TRASTUZUMAB CHEMO 150 MG IV SOLR
6.0000 mg/kg | Freq: Once | INTRAVENOUS | Status: AC
  Administered 2018-12-23: 378 mg via INTRAVENOUS
  Filled 2018-12-23: qty 18

## 2018-12-23 MED ORDER — HEPARIN SOD (PORK) LOCK FLUSH 100 UNIT/ML IV SOLN
500.0000 [IU] | Freq: Once | INTRAVENOUS | Status: AC | PRN
  Administered 2018-12-23: 500 [IU]
  Filled 2018-12-23: qty 5

## 2018-12-23 MED ORDER — DIPHENHYDRAMINE HCL 50 MG/ML IJ SOLN
INTRAMUSCULAR | Status: AC
  Filled 2018-12-23: qty 1

## 2018-12-23 MED ORDER — SODIUM CHLORIDE 0.9 % IV SOLN
Freq: Once | INTRAVENOUS | Status: AC
  Administered 2018-12-23: 09:00:00 via INTRAVENOUS
  Filled 2018-12-23: qty 250

## 2018-12-23 MED ORDER — ACETAMINOPHEN 325 MG PO TABS
650.0000 mg | ORAL_TABLET | Freq: Once | ORAL | Status: AC
  Administered 2018-12-23: 09:00:00 650 mg via ORAL

## 2018-12-23 MED ORDER — SODIUM CHLORIDE 0.9 % IV SOLN
300.0000 mg | Freq: Once | INTRAVENOUS | Status: AC
  Administered 2018-12-23: 300 mg via INTRAVENOUS
  Filled 2018-12-23: qty 30

## 2018-12-23 MED ORDER — SODIUM CHLORIDE 0.9% FLUSH
10.0000 mL | INTRAVENOUS | Status: DC | PRN
  Administered 2018-12-23: 10 mL
  Filled 2018-12-23: qty 10

## 2018-12-23 MED ORDER — ACETAMINOPHEN 325 MG PO TABS
ORAL_TABLET | ORAL | Status: AC
  Filled 2018-12-23: qty 2

## 2018-12-23 MED ORDER — SODIUM CHLORIDE 0.9 % IV SOLN
135.0000 mg/m2 | Freq: Once | INTRAVENOUS | Status: AC
  Administered 2018-12-23: 222 mg via INTRAVENOUS
  Filled 2018-12-23: qty 37

## 2018-12-23 MED ORDER — SODIUM CHLORIDE 0.9 % IV SOLN
Freq: Once | INTRAVENOUS | Status: AC
  Administered 2018-12-23: 10:00:00 via INTRAVENOUS
  Filled 2018-12-23: qty 5

## 2018-12-23 NOTE — Patient Instructions (Signed)
[facility] Discharge Instructions for Patients Receiving Chemotherapy  Today you received the following chemotherapy agents Paclitaxel (TAXOL), Carboplatin (PARAPLATIN) & Trastuzumab ([name]).  To help prevent nausea and vomiting after your treatment, we encourage you to take your nausea medication as prescribed.   If you develop nausea and vomiting that is not controlled by your nausea medication, call the clinic.   BELOW ARE SYMPTOMS THAT SHOULD BE REPORTED IMMEDIATELY:  *FEVER GREATER THAN 100.5 F  *CHILLS WITH OR WITHOUT FEVER  NAUSEA AND VOMITING THAT IS NOT CONTROLLED WITH YOUR NAUSEA MEDICATION  *UNUSUAL SHORTNESS OF BREATH  *UNUSUAL BRUISING OR BLEEDING  TENDERNESS IN MOUTH AND THROAT WITH OR WITHOUT PRESENCE OF ULCERS  *URINARY PROBLEMS  *BOWEL PROBLEMS  UNUSUAL RASH Items with * indicate a potential emergency and should be followed up as soon as possible.  Feel free to call the clinic should you have any questions or concerns. The clinic phone number is [phone].  Please show the [facility] at check-in to the Emergency Department and triage nurse.  Coronavirus (COVID-19) Are you at risk?  Are you at risk for the Coronavirus (COVID-19)?  To be considered HIGH RISK for Coronavirus (COVID-19), you have to meet the following criteria:  . Traveled to [place], [place], [place], [place] or [place]; or in the [place] to [place], [place], [place], or [place]; and have fever, cough, and shortness of breath within the last 2 weeks of travel OR . Been in close contact with a person diagnosed with COVID-19 within the last 2 weeks and have fever, cough, and shortness of breath . IF YOU DO NOT MEET THESE CRITERIA, YOU ARE CONSIDERED LOW RISK FOR COVID-19.  What to do if you are HIGH RISK for COVID-19?  Marland Kitchen If you are having a medical emergency, call 911. . Seek medical care right away. Before you go to a doctor's office, urgent care or  emergency department, call ahead and tell them about your recent travel, contact with someone diagnosed with COVID-19, and your symptoms. You should receive instructions from your physician's office regarding next steps of care.  . When you arrive at healthcare provider, tell the healthcare staff immediately you have returned from visiting [place], [place], [place], [place] or [place]; or traveled in the [place] to [place], [place], [place], or [place]; in the last two weeks or you have been in close contact with a person diagnosed with COVID-19 in the last 2 weeks.   . Tell the health care staff about your symptoms: fever, cough and shortness of breath. . After you have been seen by a medical provider, you will be either: o Tested for (COVID-19) and discharged home on quarantine except to seek medical care if symptoms worsen, and asked to  - Stay home and avoid contact with others until you get your results (4-5 days)  - Avoid travel on public transportation if possible (such as bus, train, or airplane) or o Sent to the Emergency Department by EMS for evaluation, COVID-19 testing, and possible admission depending on your condition and test results.  What to do if you are LOW RISK for COVID-19?  Reduce your risk of any infection by using the same precautions used for avoiding the common cold or flu:  Marland Kitchen Wash your hands often with soap and warm water for at least 20 seconds.  If soap and water are not readily available, use an alcohol-based hand sanitizer with at least 60% alcohol.  Marland Kitchen  If coughing or sneezing, cover your mouth and nose by coughing or sneezing into the elbow areas of your shirt or coat, into a tissue or into your sleeve (not your hands). . Avoid shaking hands with others and consider head nods or verbal greetings only. . Avoid touching your eyes, nose, or mouth with unwashed hands.  . Avoid close contact with people who are sick. . Avoid places or events with large numbers  of people in one location, like concerts or sporting events. . Carefully consider travel plans you have or are making. . If you are planning any travel outside or inside the [place], visit the CDC's Travelers' Health webpage for the latest health notices. . If you have some symptoms but not all symptoms, continue to monitor at home and seek medical attention if your symptoms worsen. . If you are having a medical emergency, call 911.   [place] / e-Visit: [email or web address]         [facility]: [facility]: [phone]                   [facility]: [phone]

## 2018-12-23 NOTE — Telephone Encounter (Signed)
-----   Message from [name], MD sent at 12/23/2018  8:23 AM EDT ----- Regarding: schedule echo Can be done any day before next treatment in 3 weeks

## 2018-12-23 NOTE — Telephone Encounter (Signed)
Called and given appt date and time of echo on 6/15 at 10 am, arrive at 0945 at [facility]. She verbalized understanding.

## 2018-12-23 NOTE — Progress Notes (Signed)
[name] OFFICE PROGRESS NOTE  Patient Care Team: Burnard Bunting, [name]  ASSESSMENT & PLAN:  Endometrial cancer [facility]) I have reviewed imaging studies with the patient She has good response to therapy and overall tolerated treatment well except for mild progressive anemia We will proceed with chemotherapy as scheduled I will order repeat echocardiogram to follow. I plan to repeat PET CT scan again in 3 to 6 months.  Metastasis to lymph nodes (HCC) The axillary lymphadenopathy has resolved but she have new lymphadenopathy near the groin region She is not symptomatic Observe only for now with repeat imaging study in a few months  CKD (chronic kidney disease), stage III ([place]) Renal function is stable.  We will continue to adjust dose of her chemotherapy  Anemia in neoplastic disease This is likely due to recent treatment. The patient denies recent history of bleeding such as epistaxis, hematuria or hematochezia. She is asymptomatic from the anemia. I will observe for now.  She does not require transfusion now. I will continue the chemotherapy at current dose without dosage adjustment.  If the anemia gets progressive worse in the future, I might have to delay her treatment or adjust the chemotherapy dose.   Pulmonary nodules/lesions, multiple The pulmonary nodules are stable We will continue to observe with serial imaging study.   Orders Placed This Encounter  Procedures  . ECHOCARDIOGRAM COMPLETE    Standing Status:   Future    Standing Expiration Date:   79/03/2020    Order Specific Question:   Where should this test be performed    Answer:   [place]    Order Specific Question:   Perflutren DEFINITY (image enhancing agent) should be administered unless hypersensitivity or allergy exist    Answer:   Administer Perflutren    Order Specific Question:   Reason for exam-Echo    Answer:   Chemotherapy evaluation  v87.41 / v58.11     INTERVAL HISTORY: Please see below for problem oriented charting. She returns for cycle 4 of chemotherapy. Her daughter is also available on the phone to collaborate her history She tolerated her treatment well except for some mild fatigue So far, with no nausea, changes in bowel habits or peripheral neuropathy She denies infection, fever or chills The patient denies any recent signs or symptoms of bleeding such as spontaneous epistaxis, hematuria or hematochezia. She has no signs or symptoms to suggest congestive heart failure  SUMMARY OF ONCOLOGIC HISTORY: Oncology History   Vicki Cervantes  has a remote history of left  breast cancer at age 78 but received BRCA testing approximately 5 years ago which was negative. Her cancer was treated with surgery but no radiation or chemotherapy.   Serous endometrial cancer, MSI stable ER positive, PR neg, Her2/neu 3+        Endometrial cancer ([place])   08/29/2016 Pathology Results    Endometrium, biopsy - HIGH GRADE ENDOMETRIAL CARCINOMA, SEE COMMENT. Microscopic Comment The sections show multiple fragments of adenocarcinoma displaying glandular and papillary patterns associated with high grade cytomorphology characterized by nuclear pleomorphism, prominent nucleoli and brisk mitosis. Immunohistochemical stains show that the tumor cells are positive for vimentin, p16, p53 with increased Ki-67 expression. Estrogen and progesterone receptor stains show patchy weak positivity. No significant positivity is seen with CEA. The findings are consistent with high grade endometrial carcinoma and the overall morphology and phenotypic features favor serous carcinoma.    08/29/2016 Initial Diagnosis    She  presented with postmenopausal bleeding    10/03/2016 Imaging    CT C/A/P 09/2016 IMPRESSION: 1. Thickening of the endometrial canal up to 19 mm in fundus, presumably corresponding to the patient's reported endometrial carcinoma. 2. Multiple tiny  pulmonary nodules scattered throughout the lungs bilaterally measuring 4 mm or less in size. Nodules of this size are typically considered statistically likely benign. In the setting of known primary malignancy, metastatic disease to the lungs is not excluded, but is not strongly favored on today's examination. Attention on followup studies is recommended to ensure the stability or resolution of these nodules. 3. Subcentimeter low-attenuation lesion in the central aspect of segment 8 of the liver is too small to characterize. This is statistically likely a tiny cyst, but warrants attention on follow-up studies to exclude the possibility of a solitary hepatic metastasis. 4. 1.5 x 1.5 x 1.7 cm well-circumscribed lesion in the proximal stomach. This is of uncertain etiology and significance, and could represent a benign gastric polyp, however, further evaluation with nonemergent endoscopy is suggested in the near future for further evaluation. 5. **An incidental finding of potential clinical significance has been found. 1.1 x 1.6 cm thyroid nodule in the inferior aspect of the right lobe of the thyroid gland. Follow-up evaluation with nonemergent thyroid ultrasound is recommended in the near future to better evaluate this finding. This recommendation follows ACR consensuss guidelines: Managing Incidental Thyroid Nodules Detected on Imaging: White Paper of the ACR Incidental Thyroid Findings Committee. J Am Coll Radiol 2015;12(2):143-150.** 6. Aortic atherosclerosis, in addition to left anterior descending coronary artery disease    10/22/2016 Surgery    Robotic assisted total hysterectomy, BSO and bilateral pelvic lymphadenectomy  Final pathology revealed a 3cm polyp containing serous carcinoma but with no myometrial invasion, no LVSI and negative nodes.  Stage IA Uterine serous cancer    10/22/2016 Pathology Results    1. Lymph nodes, regional resection, right pelvic - SIX BENIGN LYMPH NODES (0/6). 2.  Lymph nodes, regional resection, left pelvic - SEVEN BENIGN LYMPH NODES (0/7). 3. Uterus +/- tubes/ovaries, neoplastic, with right ovary and fallopian tube ENDOMYOMETRIUM - SEROUS CARCINOMA ARISING WITHIN AN ENDOMETRIAL POLYP - NO MYOMETRIAL INVASION IDENTIFIED - ADENOMYOSIS - LEIOMYOMA (1 CM) - SEE ONCOLOGY TABLE AND COMMENT CERVIX - CARCINOMA FOCALLY INVOLVES ENDOCERVICAL GLANDS - NABOTHIAN CYSTS RIGHT ADNEXA - BENIGN OVARY AND FALLOPIAN TUBE - NO CARCINOMA IDENTIFIED 4. Cul-de-sac biopsy - MESOTHELIAL HYPERPLASIA Microscopic Comment 3. ONCOLOGY TABLE-UTERUS, CARCINOMA OR CARCINOSARCOMA Specimen: Uterus, right fallopian tube and ovary Procedure: Total hysterectomy and right salpingo-oophorectomy Lymph node sampling performed: Bilateral pelvic regional resection Specimen integrity: Intact Maximum tumor size: 3 cm (polyp) Histologic type: Serous carcinoma Grade: High grade Myometrial invasion: Not identified Cervical stromal involvement: No, focal endocervical gland involvement Extent of involvement of other organs: Not identified Lymph - vascular invasion: Not identified Peritoneal washings: N/A Lymph nodes: Examined: 0 Sentinel 13 Non-sentinel 13 Total Lymph nodes with metastasis: 0 Isolated tumor cells (< 0.2 mm): 0 Micrometastasis: (> 0.2 mm and < 2.0 mm): 0 Macrometastasis: (> 2.0 mm): 0 Extracapsular extension: N/A Pelvic lymph nodes: 0 involved of 13 lymph nodes. Para-aortic lymph nodes: No para-aortic nodes submitted TNM code: pT1a, pNX FIGO Stage (based on pathologic findings, needs clinical correlation): IA Comment: Immunohistochemistry for cytokeratin AE1/AE3 is performed on all of the lymph nodes (parts 1 & 2) and no metastatic carcinoma is identified.    07/04/2017 Genetic Testing    The patient had genetic testing due to a personal history of breast  and uterine cancer, and a family history of stomach cancer.  The Multi-Cancer Panel was ordered. The  Multi-Cancer Panel offered by [facility] includes sequencing and/or deletion duplication testing of the following 83 genes: ALK, APC, ATM, AXIN2,BAP1,  BARD1, BLM, BMPR1A, BRCA1, BRCA2, BRIP1, CASR, CDC73, CDH1, CDK4, CDKN1B, CDKN1C, CDKN2A (p14ARF), CDKN2A (p16INK4a), CEBPA, CHEK2, CTNNA1, DICER1, DIS3L2, EGFR (c.2369C>T, p.Thr790Met variant only), EPCAM (Deletion/duplication testing only), FH, FLCN, GATA2, GPC3, GREM1 (Promoter region deletion/duplication testing only), HOXB13 (c.251G>A, p.Gly84Glu), HRAS, KIT, MAX, MEN1, MET, MITF (c.952G>A, p.Glu318Lys variant only), MLH1, MSH2, MSH3, MSH6, MUTYH, NBN, NF1, NF2, NTHL1, PALB2, PDGFRA, PHOX2B, PMS2, POLD1, POLE, POT1, PRKAR1A, PTCH1, PTEN, RAD50, RAD51C, RAD51D, RB1, RECQL4, RET, RUNX1, SDHAF2, SDHA (sequence changes only), SDHB, SDHC, SDHD, SMAD4, SMARCA4, SMARCB1, SMARCE1, STK11, SUFU, TERC, TERT, TMEM127, TP53, TSC1, TSC2, VHL, WRN and WT1.   Results: No pathogenic variants were identified.  A variant of uncertain significance in the gene APC was identified.  c.791A>G (p.Gln264Arg).  The date of this test report is 07/04/2017.      Genetic Testing    Patient has genetic testing done for MSI. 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     12/03/2017 Imaging    CT Chest/Abd/Pelvis to follow pulmonary nodule and gastric mass IMPRESSION: 1. Stable CT of the chest. Small pulmonary nodules are unchanged when compared with previous exam. 2. No new findings identified. 3. Subcentimeter low-attenuation lesions within the liver are remain too small to characterize but are stable from prior exam. 4. Persistent indeterminate low-attenuation structure within the proximal stomach is unchanged measuring 1.4 cm. Correlation with direct visualization is advised    06/30/2018 Imaging    CT CHEST Lungs/Pleura: Stable scattered sub-cm pulmonary nodules are again seen bilaterally and are stable compared to previous studies. No new  or enlarging pulmonary nodules or masses identified. No evidence of pulmonary infiltrate or pleural effusion.    08/19/2018 Echocardiogram    [place] is done in [place]: EF 60-65%. Mild impaired relaxation. (report scanned)    09/17/2018 Relapse/Recurrence    Presented with c/o vagina discharge.  Lesion noted at the left vaginal apex 43m lesion removed Path c/w high grade serous cancer    09/17/2018 Pathology Results    Vagina, biopsy, left apex - HIGH GRADE SEROUS CARCINOMA.    09/28/2018 PET scan    1. Two hypermetabolic axial lymph nodes. Unusual site for metastatic endometrial carcinoma however the activity is more intense than typically seen in reactive adenopathy. Suggest ultrasound-guided percutaneous biopsy of the larger RIGHT axial lymph node 2. No evidence of local recurrence at the vaginal cuff.  3. No metastatic adenopathy in the abdomen or pelvis. 4. Stable small pulmonary nodules    10/09/2018 Pathology Results    Lymph node, needle/core biopsy, right axilla - METASTATIC CARCINOMA, SEE COMMENT. Microscopic Comment The carcinoma appears high grade. Immunohistochemistry is positive for cytokeratin 7, PAX-8, and ER. Cytokeratin 20, CDX-2, PR, and GATA-3 are negative. The findings along with the history are consistent with a gynecologic primary.     10/09/2018 Procedure    Ultrasound-guided core biopsies of a right axillary lymph node.    10/14/2018 Cancer Staging    Staging form: Corpus Uteri - Carcinoma and Carcinosarcoma, AJCC 8th Edition - Clinical: Stage IVB (cT1, cN0, pM1) - Signed by GHeath Lark MD on 10/14/2018    10/19/2018 Imaging    Placement of single lumen port a cath via right internal jugular vein. The catheter tip lies at the cavo-atrial junction.  A power injectable port a cath was placed and is ready for immediate use.     10/21/2018 -  Chemotherapy    The patient had carboplatin and taxol for treatment    11/11/2018 -  Chemotherapy    The patient had trastuzumab ([name])  450 mg in sodium chloride 0.9 % 250 mL chemo infusion, 483 mg, Intravenous,  Once, 2 of 5 cycles Administration: 450 mg (11/11/2018), 378 mg (12/02/2018)  for chemotherapy treatment.     12/21/2018 PET scan    1. Interval resolution of hypermetabolic right axillary lymph nodes. No metabolic findings highly suspicious for recurrent metastatic disease. 2. New mild hypermetabolism within a borderline prominent portacaval lymph node, nonspecific. While a reactive node is favored, a lymph node metastasis cannot be entirely excluded. Suggest attention to this lymph node on follow-up PET-CT in 3-6 months. 3. Nonspecific new hypermetabolism at the ileocecal valve, more likely physiologic given absence of CT correlate. 4. Scattered subcentimeter pulmonary nodules are all stable and below PET resolution, more likely benign, continued CT surveillance advised. 5. Chronic findings include: Aortic Atherosclerosis (ICD10-I70.0). Marked diffuse colonic diverticulosis. Coronary atherosclerosis.      Metastasis to lymph nodes ([place])   10/14/2018 Initial Diagnosis    Metastasis to lymph nodes ([place])    10/21/2018 -  Chemotherapy    The patient had palonosetron (ALOXI) injection 0.25 mg, 0.25 mg, Intravenous,  Once, 3 of 6 cycles Administration: 0.25 mg (10/21/2018), 0.25 mg (11/11/2018), 0.25 mg (12/02/2018) CARBOplatin (PARAPLATIN) 310 mg in sodium chloride 0.9 % 250 mL chemo infusion, 310 mg, Intravenous,  Once, 3 of 6 cycles Dose modification: 300 mg (original dose 311.5 mg, Cycle 4, Reason: Dose not tolerated) Administration: 310 mg (10/21/2018), 300 mg (11/11/2018), 300 mg (12/02/2018) PACLitaxel (TAXOL) 222 mg in sodium chloride 0.9 % 250 mL chemo infusion (> 49m/m2), 135 mg/m2 = 222 mg, Intravenous,  Once, 3 of 6 cycles Administration: 222 mg (10/21/2018), 222 mg (11/11/2018), 222 mg (12/02/2018) fosaprepitant (EMEND) 150 mg, dexamethasone (DECADRON) 12 mg in sodium chloride 0.9 % 145 mL IVPB, , Intravenous,  Once, 3 of 6  cycles Administration:  (10/21/2018),  (11/11/2018),  (12/02/2018)  for chemotherapy treatment.      REVIEW OF SYSTEMS:   Constitutional: Denies fevers, chills or abnormal weight loss Eyes: Denies blurriness of vision Ears, nose, mouth, throat, and face: Denies mucositis or sore throat Respiratory: Denies cough, dyspnea or wheezes Cardiovascular: Denies palpitation, chest discomfort or lower extremity swelling Gastrointestinal:  Denies nausea, heartburn or change in bowel habits Skin: Denies abnormal skin rashes Lymphatics: Denies new lymphadenopathy or easy bruising Neurological:Denies numbness, tingling or new weaknesses Behavioral/Psych: Mood is stable, no new changes  All other systems were reviewed with the patient and are negative.  I have reviewed the past medical history, past surgical history, social history and family history with the patient and they are unchanged from previous note.  ALLERGIES:  has No Known Allergies.  MEDICATIONS:  Current Outpatient Medications  Medication Sig Dispense Refill  . acetaminophen (TYLENOL) 500 MG tablet Take 1,000 mg by mouth every 6 (six) hours as needed.    .Marland Kitchenaspirin EC 81 MG tablet Take 81 mg by mouth daily.    .Marland Kitchenatorvastatin (LIPITOR) 20 MG tablet Take 20 mg by mouth every evening.     . Calcium Carbonate-Vitamin D (CALCIUM-VITAMIN D) 500-200 MG-UNIT per tablet Take 1 tablet by mouth daily.    . citalopram (CELEXA) 20 MG tablet Take 20 mg by mouth at bedtime.     .Marland Kitchen  dexamethasone (DECADRON) 4 MG tablet Take 2 tabs at the night before and 2 tabs the morning of chemotherapy, every 3 weeks, by mouth 24 tablet 0  . levothyroxine (SYNTHROID, LEVOTHROID) 100 MCG tablet Take 100 mcg by mouth daily before breakfast.     . lidocaine-prilocaine (EMLA) cream Apply to affected area once 30 g 3  . losartan-hydrochlorothiazide (HYZAAR) 100-25 MG tablet     . metoprolol succinate (TOPROL-XL) 25 MG 24 hr tablet Take 25 mg by mouth daily.    . Multiple  Vitamin (MULTIVITAMIN WITH MINERALS) TABS Take 1 tablet by mouth daily.    . ondansetron (ZOFRAN) 8 MG tablet Take 1 tablet (8 mg total) by mouth every 8 (eight) hours as needed. Start on the third day after chemotherapy. 30 tablet 1  . oxyCODONE (OXY IR/ROXICODONE) 5 MG immediate release tablet Take 1 tablet (5 mg total) by mouth every 6 (six) hours as needed for severe pain. 30 tablet 0  . pantoprazole (PROTONIX) 40 MG tablet Take 40 mg by mouth every morning.     . prochlorperazine (COMPAZINE) 10 MG tablet Take 1 tablet (10 mg total) by mouth every 6 (six) hours as needed (Nausea or vomiting). 30 tablet 1  . valACYclovir (VALTREX) 1000 MG tablet valacyclovir 1 gram tablet     No current facility-administered medications for this visit.     PHYSICAL EXAMINATION: ECOG PERFORMANCE STATUS: 1 - Symptomatic but completely ambulatory  Vitals:   12/23/18 0803  BP: (!) 149/82  Pulse: (!) 101  Resp: 16  Temp: 98 F (36.7 C)  SpO2: 96%   Filed Weights   12/23/18 0803  Weight: 134 lb 6.4 oz (61 kg)    GENERAL:alert, no distress and comfortable SKIN: skin color, texture, turgor are normal, no rashes or significant lesions EYES: normal, Conjunctiva are pink and non-injected, sclera clear OROPHARYNX:no exudate, no erythema and lips, buccal mucosa, and tongue normal  NECK: supple, thyroid normal size, non-tender, without nodularity LYMPH:  no palpable lymphadenopathy in the cervical, axillary or inguinal LUNGS: clear to auscultation and percussion with normal breathing effort HEART: regular rate & rhythm and no murmurs and no lower extremity edema ABDOMEN:abdomen soft, non-tender and normal bowel sounds Musculoskeletal:no cyanosis of digits and no clubbing  NEURO: alert & oriented x 3 with fluent speech, no focal motor/sensory deficits  LABORATORY DATA:  I have reviewed the data as listed    Component Value Date/Time   NA 140 12/21/2018 0829   NA 139 02/13/2017 1512   K 3.5 12/21/2018  0829   K 4.2 02/13/2017 1512   CL 102 12/21/2018 0829   CO2 24 12/21/2018 0829   CO2 28 02/13/2017 1512   GLUCOSE 106 (H) 12/21/2018 0829   GLUCOSE 118 02/13/2017 1512   BUN 23 12/21/2018 0829   BUN 25.2 02/13/2017 1512   CREATININE 1.25 (H) 12/21/2018 0829   CREATININE 1.2 (H) 02/13/2017 1512   CALCIUM 8.1 (L) 12/21/2018 0829   CALCIUM 10.0 02/13/2017 1512   PROT 6.8 12/21/2018 0829   ALBUMIN 3.7 12/21/2018 0829   AST 21 12/21/2018 0829   ALT 15 12/21/2018 0829   ALKPHOS 88 12/21/2018 0829   BILITOT 0.3 12/21/2018 0829   GFRNONAA 41 (L) 12/21/2018 0829   GFRAA 48 (L) 12/21/2018 0829    No results found for: SPEP, UPEP  Lab Results  Component Value Date   WBC 4.7 12/21/2018   NEUTROABS 2.6 12/21/2018   HGB 9.3 (L) 12/21/2018   HCT 27.2 (L) 12/21/2018  MCV 94.4 12/21/2018   PLT 155 12/21/2018      Chemistry      Component Value Date/Time   NA 140 12/21/2018 0829   NA 139 02/13/2017 1512   K 3.5 12/21/2018 0829   K 4.2 02/13/2017 1512   CL 102 12/21/2018 0829   CO2 24 12/21/2018 0829   CO2 28 02/13/2017 1512   BUN 23 12/21/2018 0829   BUN 25.2 02/13/2017 1512   CREATININE 1.25 (H) 12/21/2018 0829   CREATININE 1.2 (H) 02/13/2017 1512      Component Value Date/Time   CALCIUM 8.1 (L) 12/21/2018 0829   CALCIUM 10.0 02/13/2017 1512   ALKPHOS 88 12/21/2018 0829   AST 21 12/21/2018 0829   ALT 15 12/21/2018 0829   BILITOT 0.3 12/21/2018 0829       RADIOGRAPHIC STUDIES: I have reviewed multiple imaging studies with the patient and her daughter and took multiple pictures with her permission I have personally reviewed the radiological images as listed and agreed with the findings in the report. Nm Pet Image Restag (ps) Skull Base To Thigh  Result Date: 12/21/2018 CLINICAL DATA:  Subsequent treatment strategy for endometrial carcinoma with recurrence on 09/17/2023 vaginal biopsy. Biopsy-proven metastasis to right axillary lymph node on 10/09/2018 biopsy. Interval  chemotherapy. EXAM: NUCLEAR MEDICINE PET SKULL BASE TO THIGH TECHNIQUE: 6.7 mCi F-18 FDG was injected intravenously. Full-ring PET imaging was performed from the skull base to thigh after the radiotracer. CT data was obtained and used for attenuation correction and anatomic localization. Fasting blood glucose: 104 mg/dl COMPARISON:  09/28/2018 PET-CT. FINDINGS: Mediastinal blood pool activity: SUV max 3.0 Liver activity: SUV max NA NECK: No hypermetabolic lymph nodes in the neck. Incidental CT findings: Right internal jugular Port-A-Cath terminates at the cavoatrial junction. CHEST: No enlarged or hypermetabolic axillary, mediastinal or hilar lymph nodes. Previously visualized hypermetabolic right axillary lymph nodes have resolved. No hypermetabolic pulmonary findings. Incidental CT findings: Left anterior descending coronary atherosclerosis. Atherosclerotic nonaneurysmal thoracic aorta. Intact appearing bilateral breast prostheses. Scattered small solid pulmonary nodules in both lungs measuring up to 4 mm in the right middle lobe (series 8/image 35) are all stable and below resolution. No acute consolidative airspace disease, lung masses or new significant pulmonary nodules. ABDOMEN/PELVIS: No abnormal hypermetabolic activity within the liver, pancreas, adrenal glands, or spleen. Borderline prominent 1.0 cm portacaval lymph node demonstrates new mild hypermetabolism with max SUV 4.2 (series 4/image 91). Otherwise no enlarged or hypermetabolic lymph nodes in the abdomen or pelvis. New nonspecific hypermetabolism at the ileocecal valve without CT correlate. No hypermetabolism at the vaginal cuff or in the deep pelvis. Incidental CT findings: Atherosclerotic nonaneurysmal abdominal aorta. Marked diffuse colonic diverticulosis. Hysterectomy. SKELETON: No focal hypermetabolic activity to suggest skeletal metastasis. Incidental CT findings: none IMPRESSION: 1. Interval resolution of hypermetabolic right axillary lymph  nodes. No metabolic findings highly suspicious for recurrent metastatic disease. 2. New mild hypermetabolism within a borderline prominent portacaval lymph node, nonspecific. While a reactive node is favored, a lymph node metastasis cannot be entirely excluded. Suggest attention to this lymph node on follow-up PET-CT in 3-6 months. 3. Nonspecific new hypermetabolism at the ileocecal valve, more likely physiologic given absence of CT correlate. 4. Scattered subcentimeter pulmonary nodules are all stable and below PET resolution, more likely benign, continued CT surveillance advised. 5. Chronic findings include: Aortic Atherosclerosis (ICD10-I70.0). Marked diffuse colonic diverticulosis. Coronary atherosclerosis. Electronically Signed   By: Ilona Sorrel M.D.   On: 12/21/2018 10:45    All questions were answered.  The patient knows to call the clinic with any problems, questions or concerns. No barriers to learning was detected.  I spent 30 minutes counseling the patient face to face. The total time spent in the appointment was 40 minutes and more than 50% was on counseling and review of test results  [name], MD 12/23/2018 8:28 AM

## 2018-12-24 ENCOUNTER — Telehealth: Payer: Self-pay | Admitting: Hematology and Oncology

## 2018-12-24 NOTE — Telephone Encounter (Signed)
I talk with patient regarding schedule  

## 2018-12-28 ENCOUNTER — Ambulatory Visit (HOSPITAL_COMMUNITY)
Admission: RE | Admit: 2018-12-28 | Discharge: 2018-12-28 | Disposition: A | Discharge status: Home / Self Care | Payer: Medicare HMO | Source: Ambulatory Visit | Attending: Hematology and Oncology | Admitting: Hematology and Oncology

## 2018-12-28 ENCOUNTER — Other Ambulatory Visit: Payer: Self-pay

## 2018-12-28 ENCOUNTER — Telehealth: Payer: Self-pay | Admitting: *Deleted

## 2018-12-28 DIAGNOSIS — I083 Combined rheumatic disorders of mitral, aortic and tricuspid valves: Secondary | ICD-10-CM | POA: Insufficient documentation

## 2018-12-28 DIAGNOSIS — C541 Malignant neoplasm of endometrium: Secondary | ICD-10-CM | POA: Insufficient documentation

## 2018-12-28 DIAGNOSIS — Z853 Personal history of malignant neoplasm of breast: Secondary | ICD-10-CM | POA: Diagnosis not present

## 2018-12-28 DIAGNOSIS — E785 Hyperlipidemia, unspecified: Secondary | ICD-10-CM | POA: Diagnosis not present

## 2018-12-28 DIAGNOSIS — I10 Essential (primary) hypertension: Secondary | ICD-10-CM | POA: Diagnosis not present

## 2018-12-28 NOTE — Telephone Encounter (Signed)
Called and scheduled an appt for the patient to see Dr. Skeet Latch on 7/2

## 2018-12-28 NOTE — Progress Notes (Signed)
  Echocardiogram 2D Echocardiogram has been performed.  Jannett Celestine 12/28/2018, 11:06 AM

## 2019-01-12 ENCOUNTER — Other Ambulatory Visit: Payer: Self-pay

## 2019-01-12 ENCOUNTER — Inpatient Hospital Stay: Payer: Medicare HMO

## 2019-01-12 ENCOUNTER — Inpatient Hospital Stay (HOSPITAL_BASED_OUTPATIENT_CLINIC_OR_DEPARTMENT_OTHER): Payer: Medicare HMO | Admitting: Hematology and Oncology

## 2019-01-12 DIAGNOSIS — C541 Malignant neoplasm of endometrium: Secondary | ICD-10-CM

## 2019-01-12 DIAGNOSIS — Z90710 Acquired absence of both cervix and uterus: Secondary | ICD-10-CM

## 2019-01-12 DIAGNOSIS — C773 Secondary and unspecified malignant neoplasm of axilla and upper limb lymph nodes: Secondary | ICD-10-CM

## 2019-01-12 DIAGNOSIS — Z79899 Other long term (current) drug therapy: Secondary | ICD-10-CM

## 2019-01-12 DIAGNOSIS — D630 Anemia in neoplastic disease: Secondary | ICD-10-CM | POA: Diagnosis not present

## 2019-01-12 DIAGNOSIS — R918 Other nonspecific abnormal finding of lung field: Secondary | ICD-10-CM | POA: Diagnosis not present

## 2019-01-12 DIAGNOSIS — N183 Chronic kidney disease, stage 3 unspecified: Secondary | ICD-10-CM

## 2019-01-12 DIAGNOSIS — Z7982 Long term (current) use of aspirin: Secondary | ICD-10-CM | POA: Diagnosis not present

## 2019-01-12 DIAGNOSIS — Z5112 Encounter for antineoplastic immunotherapy: Secondary | ICD-10-CM | POA: Diagnosis not present

## 2019-01-12 DIAGNOSIS — Z7189 Other specified counseling: Secondary | ICD-10-CM

## 2019-01-12 DIAGNOSIS — Z5111 Encounter for antineoplastic chemotherapy: Secondary | ICD-10-CM | POA: Diagnosis not present

## 2019-01-12 LAB — CMP (CANCER CENTER ONLY)
ALT: 19 U/L (ref 0–44)
AST: 25 U/L (ref 15–41)
Albumin: 4 g/dL (ref 3.5–5.0)
Alkaline Phosphatase: 79 U/L (ref 38–126)
Anion gap: 12 (ref 5–15)
BUN: 21 mg/dL (ref 8–23)
CO2: 27 mmol/L (ref 22–32)
Calcium: 8.6 mg/dL — ABNORMAL LOW (ref 8.9–10.3)
Chloride: 100 mmol/L (ref 98–111)
Creatinine: 1.3 mg/dL — ABNORMAL HIGH (ref 0.44–1.00)
GFR, Est AFR Am: 45 mL/min — ABNORMAL LOW (ref >60)
GFR, Estimated: 39 mL/min — ABNORMAL LOW (ref >60)
Glucose, Bld: 97 mg/dL (ref 70–99)
Potassium: 3.7 mmol/L (ref 3.5–5.1)
Sodium: 139 mmol/L (ref 135–145)
Total Bilirubin: 0.3 mg/dL (ref 0.3–1.2)
Total Protein: 7 g/dL (ref 6.5–8.1)

## 2019-01-12 LAB — CBC WITH DIFFERENTIAL (CANCER CENTER ONLY)
Abs Immature Granulocytes: 0.07 10*3/uL (ref 0.00–0.07)
Basophils Absolute: 0 10*3/uL (ref 0.0–0.1)
Basophils Relative: 1 %
Eosinophils Absolute: 0.1 10*3/uL (ref 0.0–0.5)
Eosinophils Relative: 2 %
HCT: 28.7 % — ABNORMAL LOW (ref 36.0–46.0)
Hemoglobin: 9.9 g/dL — ABNORMAL LOW (ref 12.0–15.0)
Immature Granulocytes: 1 %
Lymphocytes Relative: 31 %
Lymphs Abs: 1.5 10*3/uL (ref 0.7–4.0)
MCH: 33 pg (ref 26.0–34.0)
MCHC: 34.5 g/dL (ref 30.0–36.0)
MCV: 95.7 fL (ref 80.0–100.0)
Monocytes Absolute: 0.6 10*3/uL (ref 0.1–1.0)
Monocytes Relative: 13 %
Neutro Abs: 2.5 10*3/uL (ref 1.7–7.7)
Neutrophils Relative %: 52 %
Platelet Count: 169 10*3/uL (ref 150–400)
RBC: 3 MIL/uL — ABNORMAL LOW (ref 3.87–5.11)
RDW: 14.9 % (ref 11.5–15.5)
WBC Count: 4.8 10*3/uL (ref 4.0–10.5)
nRBC: 0 % (ref 0.0–0.2)

## 2019-01-13 ENCOUNTER — Encounter: Payer: Self-pay | Admitting: Hematology and Oncology

## 2019-01-13 ENCOUNTER — Inpatient Hospital Stay: Payer: Medicare HMO | Attending: Gynecologic Oncology

## 2019-01-13 ENCOUNTER — Other Ambulatory Visit: Payer: Self-pay

## 2019-01-13 VITALS — BP 144/77 | HR 84 | Temp 98.5°F | Resp 18

## 2019-01-13 DIAGNOSIS — Z452 Encounter for adjustment and management of vascular access device: Secondary | ICD-10-CM | POA: Diagnosis not present

## 2019-01-13 DIAGNOSIS — E7800 Pure hypercholesterolemia, unspecified: Secondary | ICD-10-CM | POA: Insufficient documentation

## 2019-01-13 DIAGNOSIS — N183 Chronic kidney disease, stage 3 (moderate): Secondary | ICD-10-CM | POA: Insufficient documentation

## 2019-01-13 DIAGNOSIS — Z90710 Acquired absence of both cervix and uterus: Secondary | ICD-10-CM | POA: Diagnosis not present

## 2019-01-13 DIAGNOSIS — I700 Atherosclerosis of aorta: Secondary | ICD-10-CM | POA: Insufficient documentation

## 2019-01-13 DIAGNOSIS — K219 Gastro-esophageal reflux disease without esophagitis: Secondary | ICD-10-CM | POA: Insufficient documentation

## 2019-01-13 DIAGNOSIS — Z5111 Encounter for antineoplastic chemotherapy: Secondary | ICD-10-CM | POA: Diagnosis present

## 2019-01-13 DIAGNOSIS — Z5112 Encounter for antineoplastic immunotherapy: Secondary | ICD-10-CM | POA: Insufficient documentation

## 2019-01-13 DIAGNOSIS — D61818 Other pancytopenia: Secondary | ICD-10-CM | POA: Diagnosis not present

## 2019-01-13 DIAGNOSIS — Z7189 Other specified counseling: Secondary | ICD-10-CM

## 2019-01-13 DIAGNOSIS — K449 Diaphragmatic hernia without obstruction or gangrene: Secondary | ICD-10-CM | POA: Insufficient documentation

## 2019-01-13 DIAGNOSIS — C541 Malignant neoplasm of endometrium: Secondary | ICD-10-CM | POA: Diagnosis present

## 2019-01-13 DIAGNOSIS — I10 Essential (primary) hypertension: Secondary | ICD-10-CM | POA: Diagnosis not present

## 2019-01-13 DIAGNOSIS — Z853 Personal history of malignant neoplasm of breast: Secondary | ICD-10-CM | POA: Insufficient documentation

## 2019-01-13 DIAGNOSIS — E039 Hypothyroidism, unspecified: Secondary | ICD-10-CM | POA: Diagnosis not present

## 2019-01-13 DIAGNOSIS — M1990 Unspecified osteoarthritis, unspecified site: Secondary | ICD-10-CM | POA: Diagnosis not present

## 2019-01-13 DIAGNOSIS — C773 Secondary and unspecified malignant neoplasm of axilla and upper limb lymph nodes: Secondary | ICD-10-CM | POA: Insufficient documentation

## 2019-01-13 DIAGNOSIS — C7982 Secondary malignant neoplasm of genital organs: Secondary | ICD-10-CM | POA: Diagnosis not present

## 2019-01-13 DIAGNOSIS — Z90722 Acquired absence of ovaries, bilateral: Secondary | ICD-10-CM | POA: Diagnosis not present

## 2019-01-13 DIAGNOSIS — Z7982 Long term (current) use of aspirin: Secondary | ICD-10-CM | POA: Diagnosis not present

## 2019-01-13 MED ORDER — ACETAMINOPHEN 325 MG PO TABS
ORAL_TABLET | ORAL | Status: AC
  Filled 2019-01-13: qty 2

## 2019-01-13 MED ORDER — SODIUM CHLORIDE 0.9 % IV SOLN
Freq: Once | INTRAVENOUS | Status: AC
  Administered 2019-01-13: 09:00:00 via INTRAVENOUS
  Filled 2019-01-13: qty 250

## 2019-01-13 MED ORDER — SODIUM CHLORIDE 0.9 % IV SOLN
135.0000 mg/m2 | Freq: Once | INTRAVENOUS | Status: AC
  Administered 2019-01-13: 222 mg via INTRAVENOUS
  Filled 2019-01-13: qty 37

## 2019-01-13 MED ORDER — PALONOSETRON HCL INJECTION 0.25 MG/5ML
INTRAVENOUS | Status: AC
  Filled 2019-01-13: qty 5

## 2019-01-13 MED ORDER — SODIUM CHLORIDE 0.9 % IV SOLN
Freq: Once | INTRAVENOUS | Status: AC
  Administered 2019-01-13: 10:00:00 via INTRAVENOUS
  Filled 2019-01-13: qty 5

## 2019-01-13 MED ORDER — FAMOTIDINE IN NACL 20-0.9 MG/50ML-% IV SOLN
20.0000 mg | Freq: Once | INTRAVENOUS | Status: AC
  Administered 2019-01-13: 20 mg via INTRAVENOUS

## 2019-01-13 MED ORDER — SODIUM CHLORIDE 0.9% FLUSH
10.0000 mL | INTRAVENOUS | Status: DC | PRN
  Administered 2019-01-13: 10 mL
  Filled 2019-01-13: qty 10

## 2019-01-13 MED ORDER — DIPHENHYDRAMINE HCL 50 MG/ML IJ SOLN
50.0000 mg | Freq: Once | INTRAMUSCULAR | Status: AC
  Administered 2019-01-13: 09:00:00 50 mg via INTRAVENOUS

## 2019-01-13 MED ORDER — HEPARIN SOD (PORK) LOCK FLUSH 100 UNIT/ML IV SOLN
500.0000 [IU] | Freq: Once | INTRAVENOUS | Status: AC | PRN
  Administered 2019-01-13: 500 [IU]
  Filled 2019-01-13: qty 5

## 2019-01-13 MED ORDER — DIPHENHYDRAMINE HCL 50 MG/ML IJ SOLN
INTRAMUSCULAR | Status: AC
  Filled 2019-01-13: qty 1

## 2019-01-13 MED ORDER — SODIUM CHLORIDE 0.9 % IV SOLN
300.0000 mg | Freq: Once | INTRAVENOUS | Status: AC
  Administered 2019-01-13: 15:00:00 300 mg via INTRAVENOUS
  Filled 2019-01-13: qty 30

## 2019-01-13 MED ORDER — TRASTUZUMAB CHEMO 150 MG IV SOLR
6.0000 mg/kg | Freq: Once | INTRAVENOUS | Status: AC
  Administered 2019-01-13: 378 mg via INTRAVENOUS
  Filled 2019-01-13: qty 18

## 2019-01-13 MED ORDER — ACETAMINOPHEN 325 MG PO TABS
650.0000 mg | ORAL_TABLET | Freq: Once | ORAL | Status: AC
  Administered 2019-01-13: 650 mg via ORAL

## 2019-01-13 MED ORDER — PALONOSETRON HCL INJECTION 0.25 MG/5ML
0.2500 mg | Freq: Once | INTRAVENOUS | Status: AC
  Administered 2019-01-13: 0.25 mg via INTRAVENOUS

## 2019-01-13 MED ORDER — FAMOTIDINE IN NACL 20-0.9 MG/50ML-% IV SOLN
INTRAVENOUS | Status: AC
  Filled 2019-01-13: qty 50

## 2019-01-13 NOTE — Patient Instructions (Signed)
[place] Discharge Instructions for Patients Receiving Chemotherapy  Today you received the following chemotherapy agents Trastuzumab ([name]), Paclitaxel (TAXOL) & Carboplatin (PARAPLATIN).  To help prevent nausea and vomiting after your treatment, we encourage you to take your nausea medication as prescribed.   If you develop nausea and vomiting that is not controlled by your nausea medication, call the clinic.   BELOW ARE SYMPTOMS THAT SHOULD BE REPORTED IMMEDIATELY:  *FEVER GREATER THAN 100.5 F  *CHILLS WITH OR WITHOUT FEVER  NAUSEA AND VOMITING THAT IS NOT CONTROLLED WITH YOUR NAUSEA MEDICATION  *UNUSUAL SHORTNESS OF BREATH  *UNUSUAL BRUISING OR BLEEDING  TENDERNESS IN MOUTH AND THROAT WITH OR WITHOUT PRESENCE OF ULCERS  *URINARY PROBLEMS  *BOWEL PROBLEMS  UNUSUAL RASH Items with * indicate a potential emergency and should be followed up as soon as possible.  Feel free to call the clinic should you have any questions or concerns. The clinic phone number is [phone].  Please show the [facility] at check-in to the Emergency Department and triage nurse.  Coronavirus (COVID-19) Are you at risk?  Are you at risk for the Coronavirus (COVID-19)?  To be considered HIGH RISK for Coronavirus (COVID-19), you have to meet the following criteria:  . Traveled to [place], [place], [place], [place] or [place]; or in the [place] to [place], [place], [place], or [place]; and have fever, cough, and shortness of breath within the last 2 weeks of travel OR . Been in close contact with a person diagnosed with COVID-19 within the last 2 weeks and have fever, cough, and shortness of breath . IF YOU DO NOT MEET THESE CRITERIA, YOU ARE CONSIDERED LOW RISK FOR COVID-19.  What to do if you are HIGH RISK for COVID-19?  Marland Kitchen If you are having a medical emergency, call 911. . Seek medical care right away. Before you go to a doctor's office, urgent care or  emergency department, call ahead and tell them about your recent travel, contact with someone diagnosed with COVID-19, and your symptoms. You should receive instructions from your physician's office regarding next steps of care.  . When you arrive at healthcare provider, tell the healthcare staff immediately you have returned from visiting [place], [place], [place], [place] or [place]; or traveled in the [place] to [place], [place], [place], or [place]; in the last two weeks or you have been in close contact with a person diagnosed with COVID-19 in the last 2 weeks.   . Tell the health care staff about your symptoms: fever, cough and shortness of breath. . After you have been seen by a medical provider, you will be either: o Tested for (COVID-19) and discharged home on quarantine except to seek medical care if symptoms worsen, and asked to  - Stay home and avoid contact with others until you get your results (4-5 days)  - Avoid travel on public transportation if possible (such as bus, train, or airplane) or o Sent to the Emergency Department by EMS for evaluation, COVID-19 testing, and possible admission depending on your condition and test results.  What to do if you are LOW RISK for COVID-19?  Reduce your risk of any infection by using the same precautions used for avoiding the common cold or flu:  Marland Kitchen Wash your hands often with soap and warm water for at least 20 seconds.  If soap and water are not readily available, use an alcohol-based hand sanitizer with at least 60% alcohol.  Marland Kitchen  If coughing or sneezing, cover your mouth and nose by coughing or sneezing into the elbow areas of your shirt or coat, into a tissue or into your sleeve (not your hands). . Avoid shaking hands with others and consider head nods or verbal greetings only. . Avoid touching your eyes, nose, or mouth with unwashed hands.  . Avoid close contact with people who are sick. . Avoid places or events with large numbers  of people in one location, like concerts or sporting events. . Carefully consider travel plans you have or are making. . If you are planning any travel outside or inside the [place], visit the CDC's Travelers' Health webpage for the latest health notices. . If you have some symptoms but not all symptoms, continue to monitor at home and seek medical attention if your symptoms worsen. . If you are having a medical emergency, call 911.   [place] / e-Visit: [email or web address]         [facility]: [facility]: [phone]                   [facility]: [phone]

## 2019-01-13 NOTE — Assessment & Plan Note (Signed)
So far, she tolerated chemotherapy very well with minimum side effects apart from anemia only We will repeat another imaging study in September In the meantime, we will continue chemotherapy as scheduled Her recent echocardiogram showed no evidence of cardiomyopathy

## 2019-01-13 NOTE — Assessment & Plan Note (Signed)
Renal function is stable.  We will continue to adjust dose of her chemotherapy as needed

## 2019-01-13 NOTE — Progress Notes (Signed)
Vicki Cervantes OFFICE PROGRESS NOTE  Patient Care Team: Burnard Bunting, [name]  ASSESSMENT & PLAN:  Endometrial cancer [facility]) So far, she tolerated chemotherapy very well with minimum side effects apart from anemia only We will repeat another imaging study in September In the meantime, we will continue chemotherapy as scheduled Her recent echocardiogram showed no evidence of cardiomyopathy  Anemia in neoplastic disease This is likely due to recent treatment. The patient denies recent history of bleeding such as epistaxis, hematuria or hematochezia. She is asymptomatic from the anemia. I will observe for now.  She does not require transfusion now. I will continue the chemotherapy at current dose without dosage adjustment.  If the anemia gets progressive worse in the future, I might have to delay her treatment or adjust the chemotherapy dose.   CKD (chronic kidney disease), stage III ([place]) Renal function is stable.  We will continue to adjust dose of her chemotherapy as needed   No orders of the defined types were placed in this encounter.   INTERVAL HISTORY: Please see below for problem oriented charting. She returns for further follow-up She tolerated recent chemotherapy well No recent infection, fever or chills Denies peripheral neuropathy   SUMMARY OF ONCOLOGIC HISTORY: Oncology History Overview Note  Vicki Cervantes  has a remote history of left  breast cancer at age 59 but received BRCA testing approximately 5 years ago which was negative. Her cancer was treated with surgery but no radiation or chemotherapy.   Serous endometrial cancer, MSI stable ER positive, PR neg, Her2/neu 3+      Endometrial cancer ([place])  08/29/2016 Pathology Results   Endometrium, biopsy - HIGH GRADE ENDOMETRIAL CARCINOMA, SEE COMMENT. Microscopic Comment The sections show multiple fragments of adenocarcinoma displaying glandular and papillary patterns  associated with high grade cytomorphology characterized by nuclear pleomorphism, prominent nucleoli and brisk mitosis. Immunohistochemical stains show that the tumor cells are positive for vimentin, p16, p53 with increased Ki-67 expression. Estrogen and progesterone receptor stains show patchy weak positivity. No significant positivity is seen with CEA. The findings are consistent with high grade endometrial carcinoma and the overall morphology and phenotypic features favor serous carcinoma.   08/29/2016 Initial Diagnosis   She presented with postmenopausal bleeding   10/03/2016 Imaging   CT C/A/P 09/2016 IMPRESSION: 1. Thickening of the endometrial canal up to 19 mm in fundus, presumably corresponding to the patient's reported endometrial carcinoma. 2. Multiple tiny pulmonary nodules scattered throughout the lungs bilaterally measuring 4 mm or less in size. Nodules of this size are typically considered statistically likely benign. In the setting of known primary malignancy, metastatic disease to the lungs is not excluded, but is not strongly favored on today's examination. Attention on followup studies is recommended to ensure the stability or resolution of these nodules. 3. Subcentimeter low-attenuation lesion in the central aspect of segment 8 of the liver is too small to characterize. This is statistically likely a tiny cyst, but warrants attention on follow-up studies to exclude the possibility of a solitary hepatic metastasis. 4. 1.5 x 1.5 x 1.7 cm well-circumscribed lesion in the proximal stomach. This is of uncertain etiology and significance, and could represent a benign gastric polyp, however, further evaluation with nonemergent endoscopy is suggested in the near future for further evaluation. 5. **An incidental finding of potential clinical significance has been found. 1.1 x 1.6 cm thyroid nodule in the inferior aspect of the right lobe of the thyroid gland. Follow-up evaluation with  nonemergent  thyroid ultrasound is recommended in the near future to better evaluate this finding. This recommendation follows ACR consensuss guidelines: Managing Incidental Thyroid Nodules Detected on Imaging: White Paper of the ACR Incidental Thyroid Findings Committee. J Am Coll Radiol 2015;12(2):143-150.** 6. Aortic atherosclerosis, in addition to left anterior descending coronary artery disease   10/22/2016 Surgery   Robotic assisted total hysterectomy, BSO and bilateral pelvic lymphadenectomy  Final pathology revealed a 3cm polyp containing serous carcinoma but with no myometrial invasion, no LVSI and negative nodes.  Stage IA Uterine serous cancer   10/22/2016 Pathology Results   1. Lymph nodes, regional resection, right pelvic - SIX BENIGN LYMPH NODES (0/6). 2. Lymph nodes, regional resection, left pelvic - SEVEN BENIGN LYMPH NODES (0/7). 3. Uterus +/- tubes/ovaries, neoplastic, with right ovary and fallopian tube ENDOMYOMETRIUM - SEROUS CARCINOMA ARISING WITHIN AN ENDOMETRIAL POLYP - NO MYOMETRIAL INVASION IDENTIFIED - ADENOMYOSIS - LEIOMYOMA (1 CM) - SEE ONCOLOGY TABLE AND COMMENT CERVIX - CARCINOMA FOCALLY INVOLVES ENDOCERVICAL GLANDS - NABOTHIAN CYSTS RIGHT ADNEXA - BENIGN OVARY AND FALLOPIAN TUBE - NO CARCINOMA IDENTIFIED 4. Cul-de-sac biopsy - MESOTHELIAL HYPERPLASIA Microscopic Comment 3. ONCOLOGY TABLE-UTERUS, CARCINOMA OR CARCINOSARCOMA Specimen: Uterus, right fallopian tube and ovary Procedure: Total hysterectomy and right salpingo-oophorectomy Lymph node sampling performed: Bilateral pelvic regional resection Specimen integrity: Intact Maximum tumor size: 3 cm (polyp) Histologic type: Serous carcinoma Grade: High grade Myometrial invasion: Not identified Cervical stromal involvement: No, focal endocervical gland involvement Extent of involvement of other organs: Not identified Lymph - vascular invasion: Not identified Peritoneal washings: N/A Lymph nodes: Examined: 0  Sentinel 13 Non-sentinel 13 Total Lymph nodes with metastasis: 0 Isolated tumor cells (< 0.2 mm): 0 Micrometastasis: (> 0.2 mm and < 2.0 mm): 0 Macrometastasis: (> 2.0 mm): 0 Extracapsular extension: N/A Pelvic lymph nodes: 0 involved of 13 lymph nodes. Para-aortic lymph nodes: No para-aortic nodes submitted TNM code: pT1a, pNX FIGO Stage (based on pathologic findings, needs clinical correlation): IA Comment: Immunohistochemistry for cytokeratin AE1/AE3 is performed on all of the lymph nodes (parts 1 & 2) and no metastatic carcinoma is identified.   07/04/2017 Genetic Testing   The patient had genetic testing due to a personal history of breast and uterine cancer, and a family history of stomach cancer.  The Multi-Cancer Panel was ordered. The Multi-Cancer Panel offered by [facility] includes sequencing and/or deletion duplication testing of the following 83 genes: ALK, APC, ATM, AXIN2,BAP1,  BARD1, BLM, BMPR1A, BRCA1, BRCA2, BRIP1, CASR, CDC73, CDH1, CDK4, CDKN1B, CDKN1C, CDKN2A (p14ARF), CDKN2A (p16INK4a), CEBPA, CHEK2, CTNNA1, DICER1, DIS3L2, EGFR (c.2369C>T, p.Thr790Met variant only), EPCAM (Deletion/duplication testing only), FH, FLCN, GATA2, GPC3, GREM1 (Promoter region deletion/duplication testing only), HOXB13 (c.251G>A, p.Gly84Glu), HRAS, KIT, MAX, MEN1, MET, MITF (c.952G>A, p.Glu318Lys variant only), MLH1, MSH2, MSH3, MSH6, MUTYH, NBN, NF1, NF2, NTHL1, PALB2, PDGFRA, PHOX2B, PMS2, POLD1, POLE, POT1, PRKAR1A, PTCH1, PTEN, RAD50, RAD51C, RAD51D, RB1, RECQL4, RET, RUNX1, SDHAF2, SDHA (sequence changes only), SDHB, SDHC, SDHD, SMAD4, SMARCA4, SMARCB1, SMARCE1, STK11, SUFU, TERC, TERT, TMEM127, TP53, TSC1, TSC2, VHL, WRN and WT1.   Results: No pathogenic variants were identified.  A variant of uncertain significance in the gene APC was identified.  c.791A>G (p.Gln264Arg).  The date of this test report is 07/04/2017.     Genetic Testing   Patient has genetic testing done for MSI. Results  revealed patient is MSI stable on surgical pathology from 10/22/2016.    12/03/2017 Imaging   CT Chest/Abd/Pelvis to follow pulmonary nodule and gastric mass IMPRESSION: 1. Stable CT of the  chest. Small pulmonary nodules are unchanged when compared with previous exam. 2. No new findings identified. 3. Subcentimeter low-attenuation lesions within the liver are remain too small to characterize but are stable from prior exam. 4. Persistent indeterminate low-attenuation structure within the proximal stomach is unchanged measuring 1.4 cm. Correlation with direct visualization is advised   06/30/2018 Imaging   CT CHEST Lungs/Pleura: Stable scattered sub-cm pulmonary nodules are again seen bilaterally and are stable compared to previous studies. No new or enlarging pulmonary nodules or masses identified. No evidence of pulmonary infiltrate or pleural effusion.   08/19/2018 Echocardiogram   E[place] is done in [place]: EF 60-65%. Mild impaired relaxation. (report scanned)   09/17/2018 Relapse/Recurrence   Presented with c/o vagina discharge.  Lesion noted at the left vaginal apex 1m lesion removed Path c/w high grade serous cancer   09/17/2018 Pathology Results   Vagina, biopsy, left apex - HIGH GRADE SEROUS CARCINOMA.   09/28/2018 PET scan   1. Two hypermetabolic axial lymph nodes. Unusual site for metastatic endometrial carcinoma however the activity is more intense than typically seen in reactive adenopathy. Suggest ultrasound-guided percutaneous biopsy of the larger RIGHT axial lymph node 2. No evidence of local recurrence at the vaginal cuff.  3. No metastatic adenopathy in the abdomen or pelvis. 4. Stable small pulmonary nodules   10/09/2018 Pathology Results   Lymph node, needle/core biopsy, right axilla - METASTATIC CARCINOMA, SEE COMMENT. Microscopic Comment The carcinoma appears high grade. Immunohistochemistry is positive for cytokeratin 7, PAX-8, and ER. Cytokeratin 20, CDX-2, PR, and GATA-3  are negative. The findings along with the history are consistent with a gynecologic primary.    10/09/2018 Procedure   Ultrasound-guided core biopsies of a right axillary lymph node.   10/14/2018 Cancer Staging   Staging form: Corpus Uteri - Carcinoma and Carcinosarcoma, AJCC 8th Edition - Clinical: Stage IVB (cT1, cN0, pM1) - Signed by GHeath Lark MD on 10/14/2018   10/19/2018 Imaging   Placement of single lumen port a cath via right internal jugular vein. The catheter tip lies at the cavo-atrial junction. A power injectable port a cath was placed and is ready for immediate use.    10/21/2018 -  Chemotherapy   The patient had carboplatin and taxol for treatment   11/11/2018 -  Chemotherapy   The patient had trastuzumab ([name]) 450 mg in sodium chloride 0.9 % 250 mL chemo infusion, 483 mg, Intravenous,  Once, 3 of 5 cycles Administration: 450 mg (11/11/2018), 378 mg (12/02/2018), 378 mg (12/23/2018)  for chemotherapy treatment.    12/21/2018 PET scan   1. Interval resolution of hypermetabolic right axillary lymph nodes. No metabolic findings highly suspicious for recurrent metastatic disease. 2. New mild hypermetabolism within a borderline prominent portacaval lymph node, nonspecific. While a reactive node is favored, a lymph node metastasis cannot be entirely excluded. Suggest attention to this lymph node on follow-up PET-CT in 3-6 months. 3. Nonspecific new hypermetabolism at the ileocecal valve, more likely physiologic given absence of CT correlate. 4. Scattered subcentimeter pulmonary nodules are all stable and below PET resolution, more likely benign, continued CT surveillance advised. 5. Chronic findings include: Aortic Atherosclerosis (ICD10-I70.0). Marked diffuse colonic diverticulosis. Coronary atherosclerosis.    12/28/2018 Echocardiogram   1. The left ventricle has normal systolic function with an ejection fraction of 60-65%. The cavity size was normal. Left ventricular diastolic  Doppler parameters are consistent with impaired relaxation.  2. GLS recorded as -10.7 but LV appears hyperdynamic and tracking of endocardium appears poor.  3. The right ventricle has normal systolic function. The cavity was normal. There is no increase in right ventricular wall thickness.  4. Mild thickening of the mitral valve leaflet.  5. The aortic valve was not well visualized. Mild thickening of the aortic valve. Aortic valve regurgitation is trivial by color flow Doppler.   Metastasis to lymph nodes ([place])  10/14/2018 Initial Diagnosis   Metastasis to lymph nodes ([place])   10/21/2018 -  Chemotherapy   The patient had palonosetron (ALOXI) injection 0.25 mg, 0.25 mg, Intravenous,  Once, 4 of 6 cycles Administration: 0.25 mg (10/21/2018), 0.25 mg (11/11/2018), 0.25 mg (12/02/2018), 0.25 mg (12/23/2018) CARBOplatin (PARAPLATIN) 310 mg in sodium chloride 0.9 % 250 mL chemo infusion, 310 mg, Intravenous,  Once, 4 of 6 cycles Dose modification: 300 mg (original dose 311.5 mg, Cycle 4, Reason: Dose not tolerated) Administration: 310 mg (10/21/2018), 300 mg (11/11/2018), 300 mg (12/02/2018), 300 mg (12/23/2018) PACLitaxel (TAXOL) 222 mg in sodium chloride 0.9 % 250 mL chemo infusion (> 65m/m2), 135 mg/m2 = 222 mg, Intravenous,  Once, 4 of 6 cycles Administration: 222 mg (10/21/2018), 222 mg (11/11/2018), 222 mg (12/02/2018), 222 mg (12/23/2018) fosaprepitant (EMEND) 150 mg, dexamethasone (DECADRON) 12 mg in sodium chloride 0.9 % 145 mL IVPB, , Intravenous,  Once, 4 of 6 cycles Administration:  (10/21/2018),  (11/11/2018),  (12/02/2018),  (12/23/2018)  for chemotherapy treatment.      REVIEW OF SYSTEMS:   Constitutional: Denies fevers, chills or abnormal weight loss Eyes: Denies blurriness of vision Ears, nose, mouth, throat, and face: Denies mucositis or sore throat Respiratory: Denies cough, dyspnea or wheezes Cardiovascular: Denies palpitation, chest discomfort or lower extremity swelling Gastrointestinal:   Denies nausea, heartburn or change in bowel habits Skin: Denies abnormal skin rashes Lymphatics: Denies new lymphadenopathy or easy bruising Neurological:Denies numbness, tingling or new weaknesses Behavioral/Psych: Mood is stable, no new changes  All other systems were reviewed with the patient and are negative.  I have reviewed the past medical history, past surgical history, social history and family history with the patient and they are unchanged from previous note.  ALLERGIES:  has No Known Allergies.  MEDICATIONS:  Current Outpatient Medications  Medication Sig Dispense Refill  . acetaminophen (TYLENOL) 500 MG tablet Take 1,000 mg by mouth every 6 (six) hours as needed.    .Marland Kitchenaspirin EC 81 MG tablet Take 81 mg by mouth daily.    .Marland Kitchenatorvastatin (LIPITOR) 20 MG tablet Take 20 mg by mouth every evening.     . Calcium Carbonate-Vitamin D (CALCIUM-VITAMIN D) 500-200 MG-UNIT per tablet Take 1 tablet by mouth daily.    . citalopram (CELEXA) 20 MG tablet Take 20 mg by mouth at bedtime.     .Marland Kitchendexamethasone (DECADRON) 4 MG tablet Take 2 tabs at the night before and 2 tabs the morning of chemotherapy, every 3 weeks, by mouth 24 tablet 0  . levothyroxine (SYNTHROID, LEVOTHROID) 100 MCG tablet Take 100 mcg by mouth daily before breakfast.     . lidocaine-prilocaine (EMLA) cream Apply to affected area once 30 g 3  . losartan-hydrochlorothiazide (HYZAAR) 100-25 MG tablet     . metoprolol succinate (TOPROL-XL) 25 MG 24 hr tablet Take 25 mg by mouth daily.    . Multiple Vitamin (MULTIVITAMIN WITH MINERALS) TABS Take 1 tablet by mouth daily.    . ondansetron (ZOFRAN) 8 MG tablet Take 1 tablet (8 mg total) by mouth every 8 (eight) hours as needed. Start on the third day after chemotherapy. 30 tablet  1  . oxyCODONE (OXY IR/ROXICODONE) 5 MG immediate release tablet Take 1 tablet (5 mg total) by mouth every 6 (six) hours as needed for severe pain. 30 tablet 0  . pantoprazole (PROTONIX) 40 MG tablet Take 40  mg by mouth every morning.     . prochlorperazine (COMPAZINE) 10 MG tablet Take 1 tablet (10 mg total) by mouth every 6 (six) hours as needed (Nausea or vomiting). 30 tablet 1  . valACYclovir (VALTREX) 1000 MG tablet valacyclovir 1 gram tablet     No current facility-administered medications for this visit.     PHYSICAL EXAMINATION: ECOG PERFORMANCE STATUS: 1 - Symptomatic but completely ambulatory  Vitals:   01/12/19 1014  BP: (!) 145/60  Pulse: 87  Resp: 18  Temp: 98.3 F (36.8 C)  SpO2: 100%   Filed Weights   01/12/19 1014  Weight: 134 lb (60.8 kg)    GENERAL:alert, no distress and comfortable SKIN: skin color, texture, turgor are normal, no rashes or significant lesions EYES: normal, Conjunctiva are pink and non-injected, sclera clear OROPHARYNX:no exudate, no erythema and lips, buccal mucosa, and tongue normal  NECK: supple, thyroid normal size, non-tender, without nodularity LYMPH:  no palpable lymphadenopathy in the cervical, axillary or inguinal LUNGS: clear to auscultation and percussion with normal breathing effort HEART: regular rate & rhythm and no murmurs and no lower extremity edema ABDOMEN:abdomen soft, non-tender and normal bowel sounds Musculoskeletal:no cyanosis of digits and no clubbing  NEURO: alert & oriented x 3 with fluent speech, no focal motor/sensory deficits  LABORATORY DATA:  I have reviewed the data as listed    Component Value Date/Time   NA 139 01/12/2019 0931   NA 139 02/13/2017 1512   K 3.7 01/12/2019 0931   K 4.2 02/13/2017 1512   CL 100 01/12/2019 0931   CO2 27 01/12/2019 0931   CO2 28 02/13/2017 1512   GLUCOSE 97 01/12/2019 0931   GLUCOSE 118 02/13/2017 1512   BUN 21 01/12/2019 0931   BUN 25.2 02/13/2017 1512   CREATININE 1.30 (H) 01/12/2019 0931   CREATININE 1.2 (H) 02/13/2017 1512   CALCIUM 8.6 (L) 01/12/2019 0931   CALCIUM 10.0 02/13/2017 1512   PROT 7.0 01/12/2019 0931   ALBUMIN 4.0 01/12/2019 0931   AST 25 01/12/2019  0931   ALT 19 01/12/2019 0931   ALKPHOS 79 01/12/2019 0931   BILITOT 0.3 01/12/2019 0931   GFRNONAA 39 (L) 01/12/2019 0931   GFRAA 45 (L) 01/12/2019 0931    No results found for: SPEP, UPEP  Lab Results  Component Value Date   WBC 4.8 01/12/2019   NEUTROABS 2.5 01/12/2019   HGB 9.9 (L) 01/12/2019   HCT 28.7 (L) 01/12/2019   MCV 95.7 01/12/2019   PLT 169 01/12/2019      Chemistry      Component Value Date/Time   NA 139 01/12/2019 0931   NA 139 02/13/2017 1512   K 3.7 01/12/2019 0931   K 4.2 02/13/2017 1512   CL 100 01/12/2019 0931   CO2 27 01/12/2019 0931   CO2 28 02/13/2017 1512   BUN 21 01/12/2019 0931   BUN 25.2 02/13/2017 1512   CREATININE 1.30 (H) 01/12/2019 0931   CREATININE 1.2 (H) 02/13/2017 1512      Component Value Date/Time   CALCIUM 8.6 (L) 01/12/2019 0931   CALCIUM 10.0 02/13/2017 1512   ALKPHOS 79 01/12/2019 0931   AST 25 01/12/2019 0931   ALT 19 01/12/2019 0931   BILITOT 0.3 01/12/2019 0931  RADIOGRAPHIC STUDIES: I have personally reviewed the radiological images as listed and agreed with the findings in the report. Nm Pet Image Restag (ps) Skull Base To Thigh  Result Date: 12/21/2018 CLINICAL DATA:  Subsequent treatment strategy for endometrial carcinoma with recurrence on 09/17/2023 vaginal biopsy. Biopsy-proven metastasis to right axillary lymph node on 10/09/2018 biopsy. Interval chemotherapy. EXAM: NUCLEAR MEDICINE PET SKULL BASE TO THIGH TECHNIQUE: 6.7 mCi F-18 FDG was injected intravenously. Full-ring PET imaging was performed from the skull base to thigh after the radiotracer. CT data was obtained and used for attenuation correction and anatomic localization. Fasting blood glucose: 104 mg/dl COMPARISON:  09/28/2018 PET-CT. FINDINGS: Mediastinal blood pool activity: SUV max 3.0 Liver activity: SUV max NA NECK: No hypermetabolic lymph nodes in the neck. Incidental CT findings: Right internal jugular Port-A-Cath terminates at the cavoatrial  junction. CHEST: No enlarged or hypermetabolic axillary, mediastinal or hilar lymph nodes. Previously visualized hypermetabolic right axillary lymph nodes have resolved. No hypermetabolic pulmonary findings. Incidental CT findings: Left anterior descending coronary atherosclerosis. Atherosclerotic nonaneurysmal thoracic aorta. Intact appearing bilateral breast prostheses. Scattered small solid pulmonary nodules in both lungs measuring up to 4 mm in the right middle lobe (series 8/image 35) are all stable and below resolution. No acute consolidative airspace disease, lung masses or new significant pulmonary nodules. ABDOMEN/PELVIS: No abnormal hypermetabolic activity within the liver, pancreas, adrenal glands, or spleen. Borderline prominent 1.0 cm portacaval lymph node demonstrates new mild hypermetabolism with max SUV 4.2 (series 4/image 91). Otherwise no enlarged or hypermetabolic lymph nodes in the abdomen or pelvis. New nonspecific hypermetabolism at the ileocecal valve without CT correlate. No hypermetabolism at the vaginal cuff or in the deep pelvis. Incidental CT findings: Atherosclerotic nonaneurysmal abdominal aorta. Marked diffuse colonic diverticulosis. Hysterectomy. SKELETON: No focal hypermetabolic activity to suggest skeletal metastasis. Incidental CT findings: none IMPRESSION: 1. Interval resolution of hypermetabolic right axillary lymph nodes. No metabolic findings highly suspicious for recurrent metastatic disease. 2. New mild hypermetabolism within a borderline prominent portacaval lymph node, nonspecific. While a reactive node is favored, a lymph node metastasis cannot be entirely excluded. Suggest attention to this lymph node on follow-up PET-CT in 3-6 months. 3. Nonspecific new hypermetabolism at the ileocecal valve, more likely physiologic given absence of CT correlate. 4. Scattered subcentimeter pulmonary nodules are all stable and below PET resolution, more likely benign, continued CT  surveillance advised. 5. Chronic findings include: Aortic Atherosclerosis (ICD10-I70.0). Marked diffuse colonic diverticulosis. Coronary atherosclerosis. Electronically Signed   By: Ilona Sorrel M.D.   On: 12/21/2018 10:45    All questions were answered. The patient knows to call the clinic with any problems, questions or concerns. No barriers to learning was detected.  I spent 15 minutes counseling the patient face to face. The total time spent in the appointment was 20 minutes and more than 50% was on counseling and review of test results  [name], MD 01/13/2019 8:18 AM

## 2019-01-13 NOTE — Assessment & Plan Note (Signed)

## 2019-01-13 NOTE — Progress Notes (Signed)
GYN ONCOLOGY OFFICE VISIT  PCP Nada Libman  Referring Physician    Dr Stann Mainland  CC:  Serous endometrial cancer, vaginal and axillary recurrence  Assessment/Plan:  [name]  is a 78 y.o.  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  PET imaging notable for hypermetabolic activity and right axillary lymph nodes.  Of note the breast cancer at age 62 was left-sided and a reducing mastectomy was performed on the right.   Ultrasound-guided biopsy of the right axillary node was positive for metastatic uterine serous cancer.   Plan to continue Taxol/Carbo [name] for an additional 3 more cycles, then will tx with maintenance herceptin  Follow-up in 3 months  HPI: Vicki Cervantes is a 78 y.o.  old parous woman with  high grade serous endometrial cancer. Oncology History Overview Note  Vicki Cervantes  has a remote history of left  breast cancer at age 52 but received BRCA testing approximately 5 years ago which was negative. Her cancer was treated with surgery but no radiation or chemotherapy.   Serous endometrial cancer, MSI stable ER positive, PR neg, Her2/neu 3+      Endometrial cancer ([place])  08/29/2016 Pathology Results   Endometrium, biopsy - HIGH GRADE ENDOMETRIAL CARCINOMA, SEE COMMENT. Microscopic Comment The sections show multiple fragments of adenocarcinoma displaying glandular and papillary patterns associated with high grade cytomorphology characterized by nuclear pleomorphism, prominent nucleoli and brisk mitosis. Immunohistochemical stains show that the tumor cells are positive for vimentin, p16, p53 with increased Ki-67 expression. Estrogen and progesterone receptor stains show patchy weak positivity. No significant positivity is seen with CEA. The findings are consistent with high grade endometrial carcinoma and the overall morphology and phenotypic features favor serous carcinoma.   08/29/2016 Initial Diagnosis   She presented with postmenopausal  bleeding   10/03/2016 Imaging   CT C/A/P 09/2016 IMPRESSION: 1. Thickening of the endometrial canal up to 19 mm in fundus, presumably corresponding to the patient's reported endometrial carcinoma. 2. Multiple tiny pulmonary nodules scattered throughout the lungs bilaterally measuring 4 mm or less in size. Nodules of this size are typically considered statistically likely benign. In the setting of known primary malignancy, metastatic disease to the lungs is not excluded, but is not strongly favored on today's examination. Attention on followup studies is recommended to ensure the stability or resolution of these nodules. 3. Subcentimeter low-attenuation lesion in the central aspect of segment 8 of the liver is too small to characterize. This is statistically likely a tiny cyst, but warrants attention on follow-up studies to exclude the possibility of a solitary hepatic metastasis. 4. 1.5 x 1.5 x 1.7 cm well-circumscribed lesion in the proximal stomach. This is of uncertain etiology and significance, and could represent a benign gastric polyp, however, further evaluation with nonemergent endoscopy is suggested in the near future for further evaluation. 5. **An incidental finding of potential clinical significance has been found. 1.1 x 1.6 cm thyroid nodule in the inferior aspect of the right lobe of the thyroid gland. Follow-up evaluation with nonemergent thyroid ultrasound is recommended in the near future to better evaluate this finding. This recommendation follows ACR consensuss guidelines: Managing Incidental Thyroid Nodules Detected on Imaging: White Paper of the ACR Incidental Thyroid Findings Committee. J Am Coll Radiol 2015;12(2):143-150.** 6. Aortic atherosclerosis, in addition to left anterior descending coronary artery disease   10/22/2016 Surgery   Robotic assisted total hysterectomy, BSO and bilateral pelvic lymphadenectomy  Final pathology revealed a 3cm polyp containing serous  carcinoma but  with no myometrial invasion, no LVSI and negative nodes.  Stage IA Uterine serous cancer   10/22/2016 Pathology Results   1. Lymph nodes, regional resection, right pelvic - SIX BENIGN LYMPH NODES (0/6). 2. Lymph nodes, regional resection, left pelvic - SEVEN BENIGN LYMPH NODES (0/7). 3. Uterus +/- tubes/ovaries, neoplastic, with right ovary and fallopian tube ENDOMYOMETRIUM - SEROUS CARCINOMA ARISING WITHIN AN ENDOMETRIAL POLYP - NO MYOMETRIAL INVASION IDENTIFIED - ADENOMYOSIS - LEIOMYOMA (1 CM) - SEE ONCOLOGY TABLE AND COMMENT CERVIX - CARCINOMA FOCALLY INVOLVES ENDOCERVICAL GLANDS - NABOTHIAN CYSTS RIGHT ADNEXA - BENIGN OVARY AND FALLOPIAN TUBE - NO CARCINOMA IDENTIFIED 4. Cul-de-sac biopsy - MESOTHELIAL HYPERPLASIA Microscopic Comment 3. ONCOLOGY TABLE-UTERUS, CARCINOMA OR CARCINOSARCOMA Specimen: Uterus, right fallopian tube and ovary Procedure: Total hysterectomy and right salpingo-oophorectomy Lymph node sampling performed: Bilateral pelvic regional resection Specimen integrity: Intact Maximum tumor size: 3 cm (polyp) Histologic type: Serous carcinoma Grade: High grade Myometrial invasion: Not identified Cervical stromal involvement: No, focal endocervical gland involvement Extent of involvement of other organs: Not identified Lymph - vascular invasion: Not identified Peritoneal washings: N/A Lymph nodes: Examined: 0 Sentinel 13 Non-sentinel 13 Total Lymph nodes with metastasis: 0 Isolated tumor cells (< 0.2 mm): 0 Micrometastasis: (> 0.2 mm and < 2.0 mm): 0 Macrometastasis: (> 2.0 mm): 0 Extracapsular extension: N/A Pelvic lymph nodes: 0 involved of 13 lymph nodes. Para-aortic lymph nodes: No para-aortic nodes submitted TNM code: pT1a, pNX FIGO Stage (based on pathologic findings, needs clinical correlation): IA Comment: Immunohistochemistry for cytokeratin AE1/AE3 is performed on all of the lymph nodes (parts 1 & 2) and no metastatic carcinoma is  identified.   07/04/2017 Genetic Testing   The patient had genetic testing due to a personal history of breast and uterine cancer, and a family history of stomach cancer.  The Multi-Cancer Panel was ordered. The Multi-Cancer Panel offered by [facility] includes sequencing and/or deletion duplication testing of the following 83 genes: ALK, APC, ATM, AXIN2,BAP1,  BARD1, BLM, BMPR1A, BRCA1, BRCA2, BRIP1, CASR, CDC73, CDH1, CDK4, CDKN1B, CDKN1C, CDKN2A (p14ARF), CDKN2A (p16INK4a), CEBPA, CHEK2, CTNNA1, DICER1, DIS3L2, EGFR (c.2369C>T, p.Thr790Met variant only), EPCAM (Deletion/duplication testing only), FH, FLCN, GATA2, GPC3, GREM1 (Promoter region deletion/duplication testing only), HOXB13 (c.251G>A, p.Gly84Glu), HRAS, KIT, MAX, MEN1, MET, MITF (c.952G>A, p.Glu318Lys variant only), MLH1, MSH2, MSH3, MSH6, MUTYH, NBN, NF1, NF2, NTHL1, PALB2, PDGFRA, PHOX2B, PMS2, POLD1, POLE, POT1, PRKAR1A, PTCH1, PTEN, RAD50, RAD51C, RAD51D, RB1, RECQL4, RET, RUNX1, SDHAF2, SDHA (sequence changes only), SDHB, SDHC, SDHD, SMAD4, SMARCA4, SMARCB1, SMARCE1, STK11, SUFU, TERC, TERT, TMEM127, TP53, TSC1, TSC2, VHL, WRN and WT1.   Results: No pathogenic variants were identified.  A variant of uncertain significance in the gene APC was identified.  c.791A>G (p.Gln264Arg).  The date of this test report is 07/04/2017.     Genetic Testing   Patient has genetic testing done for MSI. 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    12/03/2017 Imaging   CT Chest/Abd/Pelvis to follow pulmonary nodule and gastric mass IMPRESSION: 1. Stable CT of the chest. Small pulmonary nodules are unchanged when compared with previous exam. 2. No new findings identified. 3. Subcentimeter low-attenuation lesions within the liver are remain too small to characterize but are stable from prior exam. 4. Persistent indeterminate low-attenuation structure within the proximal stomach is unchanged measuring 1.4 cm. Correlation  with direct visualization is advised   06/30/2018 Imaging   CT CHEST Lungs/Pleura: Stable scattered sub-cm pulmonary nodules are again seen bilaterally and are stable compared  to previous studies. No new or enlarging pulmonary nodules or masses identified. No evidence of pulmonary infiltrate or pleural effusion.   08/19/2018 Echocardiogram   [place] is done in [place]: EF 60-65%. Mild impaired relaxation. (report scanned)   09/17/2018 Relapse/Recurrence   Presented with c/o vagina discharge.  Lesion noted at the left vaginal apex 34m lesion removed Path c/w high grade serous cancer   09/17/2018 Pathology Results   Vagina, biopsy, left apex - HIGH GRADE SEROUS CARCINOMA.   09/28/2018 PET scan   1. Two hypermetabolic axial lymph nodes. Unusual site for metastatic endometrial carcinoma however the activity is more intense than typically seen in reactive adenopathy. Suggest ultrasound-guided percutaneous biopsy of the larger RIGHT axial lymph node 2. No evidence of local recurrence at the vaginal cuff.  3. No metastatic adenopathy in the abdomen or pelvis. 4. Stable small pulmonary nodules   10/09/2018 Pathology Results   Lymph node, needle/core biopsy, right axilla - METASTATIC CARCINOMA, SEE COMMENT. Microscopic Comment The carcinoma appears high grade. Immunohistochemistry is positive for cytokeratin 7, PAX-8, and ER. Cytokeratin 20, CDX-2, PR, and GATA-3 are negative. The findings along with the history are consistent with a gynecologic primary.    10/09/2018 Procedure   Ultrasound-guided core biopsies of a right axillary lymph node.   10/14/2018 Cancer Staging   Staging form: Corpus Uteri - Carcinoma and Carcinosarcoma, AJCC 8th Edition - Clinical: Stage IVB (cT1, cN0, pM1) - Signed by GHeath Lark MD on 10/14/2018   10/19/2018 Imaging   Placement of single lumen port a cath via right internal jugular vein. The catheter tip lies at the cavo-atrial junction. A power injectable port a cath was placed  and is ready for immediate use.    10/21/2018 -  Chemotherapy   The patient had carboplatin and taxol for treatment   11/11/2018 -  Chemotherapy   The patient had trastuzumab ([name]) 450 mg in sodium chloride 0.9 % 250 mL chemo infusion, 483 mg, Intravenous,  Once, 4 of 5 cycles Administration: 450 mg (11/11/2018), 378 mg (12/02/2018), 378 mg (12/23/2018), 378 mg (01/13/2019)  for chemotherapy treatment.    12/21/2018 PET scan   1. Interval resolution of hypermetabolic right axillary lymph nodes. No metabolic findings highly suspicious for recurrent metastatic disease. 2. New mild hypermetabolism within a borderline prominent portacaval lymph node, nonspecific. While a reactive node is favored, a lymph node metastasis cannot be entirely excluded. Suggest attention to this lymph node on follow-up PET-CT in 3-6 months. 3. Nonspecific new hypermetabolism at the ileocecal valve, more likely physiologic given absence of CT correlate. 4. Scattered subcentimeter pulmonary nodules are all stable and below PET resolution, more likely benign, continued CT surveillance advised. 5. Chronic findings include: Aortic Atherosclerosis (ICD10-I70.0). Marked diffuse colonic diverticulosis. Coronary atherosclerosis.    12/28/2018 Echocardiogram   1. The left ventricle has normal systolic function with an ejection fraction of 60-65%. The cavity size was normal. Left ventricular diastolic Doppler parameters are consistent with impaired relaxation.  2. GLS recorded as -10.7 but LV appears hyperdynamic and tracking of endocardium appears poor.  3. The right ventricle has normal systolic function. The cavity was normal. There is no increase in right ventricular wall thickness.  4. Mild thickening of the mitral valve leaflet.  5. The aortic valve was not well visualized. Mild thickening of the aortic valve. Aortic valve regurgitation is trivial by color flow Doppler.   Met[place]asis to [place] nodes ([place]  10/14/2018 Initial  Diagnosis   [place] nodes ([place]  10/21/2018 -  Chemotherapy   The patient had palonosetron (ALOXI) injection 0.25 mg, 0.25 mg, Intravenous,  Once, 5 of 6 cycles Administration: 0.25 mg (10/21/2018), 0.25 mg (11/11/2018), 0.25 mg (12/02/2018), 0.25 mg (12/23/2018), 0.25 mg (01/13/2019) CARBOplatin (PARAPLATIN) 310 mg in sodium chloride 0.9 % 250 mL chemo infusion, 310 mg, Intravenous,  Once, 5 of 6 cycles Dose modification: 300 mg (original dose 311.5 mg, Cycle 4, Reason: Dose not tolerated) Administration: 310 mg (10/21/2018), 300 mg (11/11/2018), 300 mg (12/02/2018), 300 mg (12/23/2018), 300 mg (01/13/2019) PACLitaxel (TAXOL) 222 mg in sodium chloride 0.9 % 250 mL chemo infusion (> 60m/m2), 135 mg/m2 = 222 mg, Intravenous,  Once, 5 of 6 cycles Administration: 222 mg (10/21/2018), 222 mg (11/11/2018), 222 mg (12/02/2018), 222 mg (12/23/2018), 222 mg (01/13/2019) fosaprepitant (EMEND) 150 mg, dexamethasone (DECADRON) 12 mg in sodium chloride 0.9 % 145 mL IVPB, , Intravenous,  Once, 5 of 6 cycles Administration:  (10/21/2018),  (11/11/2018),  (12/02/2018),  (12/23/2018),  (01/13/2019)  for chemotherapy treatment.    INTERVAL HX:  PET 6/20 IMPRESSION: 1. Interval resolution of hypermetabolic right axillary lymph nodes. No metabolic findings highly suspicious for recurrent metastatic disease. 2. New mild hypermetabolism within a borderline prominent portacaval lymph node, nonspecific. While a reactive node is favored, a lymph node metastasis cannot be entirely excluded. Suggest attention to this lymph node on follow-up PET-CT in 3-6 months. 3. Nonspecific new hypermetabolism at the ileocecal valve, more likely physiologic given absence of CT correlate. 4. Scattered subcentimeter pulmonary nodules are all stable and below PET resolution, more likely benign, continued CT surveillance advised. 5. Chronic findings include: Aortic Atherosclerosis (ICD10-I70.0). Marked diffuse colonic diverticulosis. Coronary  atherosclerosis  S/P cycle 5 Taxol/Carbo/herceptin 01/13/19.   Social Hx:   Social History   Socioeconomic History  . Marital status: Widowed    Spouse name: Not on file  . Number of children: 3  . Years of education: Not on file  . Highest education level: Not on file  Occupational History  . 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 Financial resource strain: Not on file  . Food insecurity    Worry: Not on file    Inability: Not on file  . Transportation needs    Medical: Not on file    Non-medical: Not on file  Tobacco Use  . Smoking status: Never Smoker  . Smokeless tobacco: Never Used  Substance and Sexual Activity  . Alcohol use: Yes    Alcohol/week: 3.0 standard drinks    Types: 3 Glasses of wine per week    Comment: Wine Occas.  . Drug use: No  . Sexual activity: Never  Lifestyle  . Physical activity    Days per week: Not on file    Minutes per session: Not on file  . Stress: Not on file  Relationships  . Social [place] phone: Not on file    Gets together: Not on file    Attends religious service: Not on file    Active member of club or organization: Not on file    Attends meetings of clubs or organizations: Not on file    Relationship status: Not on file  . Intimate partner violence    Fear of current or ex partner: Not on file    Emotionally abused: Not on file    Physically abused: Not on file    Forced sexual activity: Not on file  Other Topics Concern  .  Not on file  Social History Narrative  . Not on file  Spends most of  Her time in [place] with her daughter  Past Surgical Hx:  Past Surgical History:  Procedure Laterality Date  . COLONOSCOPY    . EUS N/A 01/07/2013   Procedure: UPPER ENDOSCOPIC ULTRASOUND (EUS) LINEAR;  Surgeon: Milus Banister, MD;  Location: [facility] ENDOSCOPY;  Service: Endoscopy;  Laterality: N/A;  . EUS N/A 01/05/2015   Procedure: UPPER ENDOSCOPIC ULTRASOUND (EUS)  LINEAR;  Surgeon: Milus Banister, MD;  Location: [facility] ENDOSCOPY;  Service: Endoscopy;  Laterality: N/A;  . FOOT SURGERY Left 2009-2010   x2. Surgery in 2011  . IR IMAGING GUIDED PORT INSERTION  10/19/2018  . LEFT HEART CATHETERIZATION WITH CORONARY ANGIOGRAM N/A 01/24/2014   Procedure: LEFT HEART CATHETERIZATION WITH CORONARY ANGIOGRAM;  Surgeon: Peter M Martinique, MD;  Location[facility] CATH LAB;  Service: Cardiovascular;  Laterality: N/A;  . LUMBAR [place] SURGERY  01-01-13  . MASTECTOMY, RADICAL Left 1982  . OVARY SURGERY  1972-1973  . ROBOTIC ASSISTED TOTAL HYSTERECTOMY WITH BILATERAL SALPINGO OOPHERECTOMY N/A 10/22/2016   Procedure: XI ROBOTIC ASSISTED TOTAL HYSTERECTOMY WITH RIGHT SALPINGO OOPHORECTOMY; INJECTION OF GREEN DYE WITH EXCISION OF BILATERAL PELVIC LYMPH NODES;  Surgeon: Janie Morning, MD;  Location: [facility] ORS;  Service: Gynecology;  Laterality: N/A;  . ROTATOR CUFF REPAIR Left   . SENTINEL NODE BIOPSY N/A 10/22/2016   Procedure: SENTINEL NODE BIOPSY;  Surgeon: Janie Morning, MD;  Location: [facility] ORS;  Service: Gynecology;  Laterality: N/A;  . SIMPLE MASTECTOMY Left 1982  . TOTAL KNEE ARTHROPLASTY Bilateral 2009-2010   x2  . UPPER GASTROINTESTINAL ENDOSCOPY      Past Medical Hx:  Past Medical History:  Diagnosis Date  . Aortic atherosclerosis ([place])   . Arthritis   . Blood transfusion without reported diagnosis   . Breast cancer ([place])     LEFT, '82-left breast cancer-surgery only  . Cataract    removed both eyes  . Complication of anesthesia    nausea  . Diverticulosis   . Esophageal stricture   . Family history of stomach cancer   . GERD (gastroesophageal reflux disease)   . Hemorrhoids   . Hiatal hernia   . High triglycerides   . Hypercholesteremia   . Hypertension   . Hypothyroidism   . Osteoarthritis   . PONV (postoperative nausea and vomiting)   . Uterine cancer [facility])   Social Hx: Has a daughter who lives in [place] a son who lives in [place] and and another son  who lives in [place].  Enjoys being in the lives of her children and grandchildren  Past Gynecological History:  G2P2 No LMP recorded. Patient is postmenopausal.  Family Hx:  Family History  Problem Relation Age of Onset  . Heart disease Mother   . Other Mother        blood disease  . Heart attack Father   . Colon polyps Sister   . Heart attack Brother   . Colon polyps Brother   . Stomach cancer Maternal Aunt   . Colon cancer Neg Hx   . Esophageal cancer Neg Hx   . Rectal cancer Neg Hx   . Pancreatic cancer Neg Hx     Review of Systems:  Constitutional  Feels well,    ENT Normal appearing ears and nares bilaterally Skin/Breast  No rash, sores, jaundice, itching, dryness.   Cardiovascular  No chest pain, shortness of breath, or edema  Pulmonary  No  cough or wheeze.  Gastro Intestinal  No nausea, vomitting, or diarrhoea. No bright red blood per rectum, no abdominal pain, change in bowel movement, or constipation.  Genito Urinary  No frequency, urgency, dysuria, no vaginal bleeding Musculo Skeletal  No myalgia, arthralgia, joint swelling or pain  Neurologic  No weakness, numbness, change in gait,  Psychology  No depression, anxiety, insomnia.    Physical Exam: - There were no vitals taken for this visit. Wt Readings from Last 3 Encounters:  01/12/19 134 lb (60.8 kg)  12/23/18 134 lb 6.4 oz (61 kg)  12/01/18 134 lb 9.6 oz (61.1 kg)   Physical Exam Constitutional:      Appearance: Normal appearance.  Genitourinary:     Vulva, inguinal canal, urethra, bladder and vagina normal.     Vulval Bartholin's cyst present.     No vulval lesion, tenderness, ulcerations or rash noted.     No posterior fourchette injury present.     No inguinal adenopathy present in the right or left side.    No lesions in the vagina.     Vaginal atrophy, atrophic mucosa and prolapse present.     No vaginal discharge, erythema or bleeding.     Cervix is absent.     Uterus is absent.   HENT:     Head: Normocephalic.  Eyes:     General: No scleral icterus.    Extraocular Movements: Extraocular movements intact.     Pupils: Pupils are equal, round, and reactive to light.  Neck:     Musculoskeletal: Normal range of motion and neck supple.  Cardiovascular:     Rate and Rhythm: Normal rate and regular rhythm.     Pulses: Normal pulses.     Heart sounds: Normal heart sounds.  Pulmonary:     Effort: Pulmonary effort is normal.     Breath sounds: Normal breath sounds.  Abdominal:     General: Abdomen is flat. There is no distension.     Palpations: Abdomen is soft. There is no mass.     Tenderness: There is no abdominal tenderness. There is no right CVA tenderness, left CVA tenderness, guarding or rebound.     Hernia: No hernia is present. There is no hernia in the left inguinal area or right inguinal area.  Lymphadenopathy:     Lower Body: No right inguinal adenopathy. No left inguinal adenopathy.  Neurological:     General: No focal deficit present.     Mental Status: She is alert.  Skin:    General: Skin is warm and dry.  Psychiatric:        Mood and Affect: Mood normal.        Thought Content: Thought content normal.        Judgment: Judgment normal.      Janie Morning, MD

## 2019-01-14 ENCOUNTER — Other Ambulatory Visit: Payer: Self-pay

## 2019-01-14 ENCOUNTER — Inpatient Hospital Stay (HOSPITAL_BASED_OUTPATIENT_CLINIC_OR_DEPARTMENT_OTHER): Payer: Medicare HMO | Admitting: Gynecologic Oncology

## 2019-01-14 VITALS — BP 138/69 | HR 79 | Temp 98.3°F | Resp 18 | Ht 62.5 in | Wt 136.2 lb

## 2019-01-14 DIAGNOSIS — Z853 Personal history of malignant neoplasm of breast: Secondary | ICD-10-CM | POA: Diagnosis not present

## 2019-01-14 DIAGNOSIS — Z90710 Acquired absence of both cervix and uterus: Secondary | ICD-10-CM

## 2019-01-14 DIAGNOSIS — N183 Chronic kidney disease, stage 3 (moderate): Secondary | ICD-10-CM | POA: Diagnosis not present

## 2019-01-14 DIAGNOSIS — Z90722 Acquired absence of ovaries, bilateral: Secondary | ICD-10-CM

## 2019-01-14 DIAGNOSIS — C7982 Secondary malignant neoplasm of genital organs: Secondary | ICD-10-CM

## 2019-01-14 DIAGNOSIS — Z5112 Encounter for antineoplastic immunotherapy: Secondary | ICD-10-CM | POA: Diagnosis not present

## 2019-01-14 DIAGNOSIS — C773 Secondary and unspecified malignant neoplasm of axilla and upper limb lymph nodes: Secondary | ICD-10-CM

## 2019-01-14 DIAGNOSIS — C541 Malignant neoplasm of endometrium: Secondary | ICD-10-CM | POA: Diagnosis not present

## 2019-01-14 DIAGNOSIS — D61818 Other pancytopenia: Secondary | ICD-10-CM | POA: Diagnosis not present

## 2019-01-14 DIAGNOSIS — Z5111 Encounter for antineoplastic chemotherapy: Secondary | ICD-10-CM | POA: Diagnosis not present

## 2019-01-14 NOTE — Patient Instructions (Signed)
Great to see you!  Follow-up in 3 months    Thank you very much [name] for allowing me to provide care for you today.  I appreciate your confidence in choosing our Gynecologic Oncology team.  If you have any questions about your visit today please call our office and we will get back to you as soon as possible.  Please consider using the website Medlineplus.gov as an Geneticist, molecular.   Vicki Found. Donathan Buller MD., PhD Gynecologic Oncology

## 2019-01-29 ENCOUNTER — Telehealth: Payer: Self-pay

## 2019-01-29 NOTE — Telephone Encounter (Signed)
Called and gave pt below msg. Pt verbalizes understanding to wait on vaccine until chemo is complete.

## 2019-01-29 NOTE — Telephone Encounter (Signed)
-----   Message from [name], MD sent at 01/29/2019 11:52 AM EDT ----- Regarding: RE: can she get shingrix vax I am afraid she may not benefit from the vaccine if given while she is on chemo I plan to stop carbo/taxol in September and then switch her to maintenance herceptin only Maybe she can delay her next dose until then ----- Message ----- From: Paulla Dolly, RN Sent: 01/29/2019  11:50 AM EDT To: [name], MD Subject: can she get shingrix vax                       Pt gets chemo next Wed 7/22 and is also due for second Shingrix vax.  Wants to know if OK to get the vax.

## 2019-02-02 ENCOUNTER — Other Ambulatory Visit: Payer: Self-pay

## 2019-02-02 ENCOUNTER — Inpatient Hospital Stay: Payer: Medicare HMO

## 2019-02-02 ENCOUNTER — Inpatient Hospital Stay (HOSPITAL_BASED_OUTPATIENT_CLINIC_OR_DEPARTMENT_OTHER): Payer: Medicare HMO | Admitting: Hematology and Oncology

## 2019-02-02 DIAGNOSIS — Z7189 Other specified counseling: Secondary | ICD-10-CM

## 2019-02-02 DIAGNOSIS — Z79899 Other long term (current) drug therapy: Secondary | ICD-10-CM

## 2019-02-02 DIAGNOSIS — Z853 Personal history of malignant neoplasm of breast: Secondary | ICD-10-CM | POA: Diagnosis not present

## 2019-02-02 DIAGNOSIS — C541 Malignant neoplasm of endometrium: Secondary | ICD-10-CM

## 2019-02-02 DIAGNOSIS — C7982 Secondary malignant neoplasm of genital organs: Secondary | ICD-10-CM | POA: Diagnosis not present

## 2019-02-02 DIAGNOSIS — E039 Hypothyroidism, unspecified: Secondary | ICD-10-CM

## 2019-02-02 DIAGNOSIS — I10 Essential (primary) hypertension: Secondary | ICD-10-CM

## 2019-02-02 DIAGNOSIS — I700 Atherosclerosis of aorta: Secondary | ICD-10-CM

## 2019-02-02 DIAGNOSIS — K219 Gastro-esophageal reflux disease without esophagitis: Secondary | ICD-10-CM

## 2019-02-02 DIAGNOSIS — C773 Secondary and unspecified malignant neoplasm of axilla and upper limb lymph nodes: Secondary | ICD-10-CM

## 2019-02-02 DIAGNOSIS — N183 Chronic kidney disease, stage 3 unspecified: Secondary | ICD-10-CM

## 2019-02-02 DIAGNOSIS — M1990 Unspecified osteoarthritis, unspecified site: Secondary | ICD-10-CM

## 2019-02-02 DIAGNOSIS — K449 Diaphragmatic hernia without obstruction or gangrene: Secondary | ICD-10-CM

## 2019-02-02 DIAGNOSIS — Z90722 Acquired absence of ovaries, bilateral: Secondary | ICD-10-CM | POA: Diagnosis not present

## 2019-02-02 DIAGNOSIS — Z90710 Acquired absence of both cervix and uterus: Secondary | ICD-10-CM

## 2019-02-02 DIAGNOSIS — Z5111 Encounter for antineoplastic chemotherapy: Secondary | ICD-10-CM | POA: Diagnosis not present

## 2019-02-02 DIAGNOSIS — Z7982 Long term (current) use of aspirin: Secondary | ICD-10-CM

## 2019-02-02 DIAGNOSIS — E7800 Pure hypercholesterolemia, unspecified: Secondary | ICD-10-CM

## 2019-02-02 DIAGNOSIS — D61818 Other pancytopenia: Secondary | ICD-10-CM

## 2019-02-02 DIAGNOSIS — Z5112 Encounter for antineoplastic immunotherapy: Secondary | ICD-10-CM | POA: Diagnosis not present

## 2019-02-02 LAB — CMP (CANCER CENTER ONLY)
ALT: 15 U/L (ref 0–44)
AST: 21 U/L (ref 15–41)
Albumin: 3.9 g/dL (ref 3.5–5.0)
Alkaline Phosphatase: 78 U/L (ref 38–126)
Anion gap: 12 (ref 5–15)
BUN: 23 mg/dL (ref 8–23)
CO2: 26 mmol/L (ref 22–32)
Calcium: 9.1 mg/dL (ref 8.9–10.3)
Chloride: 102 mmol/L (ref 98–111)
Creatinine: 1.24 mg/dL — ABNORMAL HIGH (ref 0.44–1.00)
GFR, Est AFR Am: 48 mL/min — ABNORMAL LOW (ref >60)
GFR, Estimated: 41 mL/min — ABNORMAL LOW (ref >60)
Glucose, Bld: 108 mg/dL — ABNORMAL HIGH (ref 70–99)
Potassium: 3.9 mmol/L (ref 3.5–5.1)
Sodium: 140 mmol/L (ref 135–145)
Total Bilirubin: 0.3 mg/dL (ref 0.3–1.2)
Total Protein: 6.9 g/dL (ref 6.5–8.1)

## 2019-02-02 LAB — CBC WITH DIFFERENTIAL (CANCER CENTER ONLY)
Abs Immature Granulocytes: 0.04 10*3/uL (ref 0.00–0.07)
Basophils Absolute: 0.1 10*3/uL (ref 0.0–0.1)
Basophils Relative: 1 %
Eosinophils Absolute: 0.1 10*3/uL (ref 0.0–0.5)
Eosinophils Relative: 2 %
HCT: 26.4 % — ABNORMAL LOW (ref 36.0–46.0)
Hemoglobin: 9.4 g/dL — ABNORMAL LOW (ref 12.0–15.0)
Immature Granulocytes: 1 %
Lymphocytes Relative: 27 %
Lymphs Abs: 1.4 10*3/uL (ref 0.7–4.0)
MCH: 34.7 pg — ABNORMAL HIGH (ref 26.0–34.0)
MCHC: 35.6 g/dL (ref 30.0–36.0)
MCV: 97.4 fL (ref 80.0–100.0)
Monocytes Absolute: 0.7 10*3/uL (ref 0.1–1.0)
Monocytes Relative: 12 %
Neutro Abs: 3 10*3/uL (ref 1.7–7.7)
Neutrophils Relative %: 57 %
Platelet Count: 130 10*3/uL — ABNORMAL LOW (ref 150–400)
RBC: 2.71 MIL/uL — ABNORMAL LOW (ref 3.87–5.11)
RDW: 14.2 % (ref 11.5–15.5)
WBC Count: 5.2 10*3/uL (ref 4.0–10.5)
nRBC: 0 % (ref 0.0–0.2)

## 2019-02-02 MED ORDER — DEXAMETHASONE 4 MG PO TABS: ORAL_TABLET | ORAL | 0 refills | Status: DC

## 2019-02-03 ENCOUNTER — Other Ambulatory Visit: Payer: Self-pay

## 2019-02-03 ENCOUNTER — Encounter: Payer: Self-pay | Admitting: Hematology and Oncology

## 2019-02-03 ENCOUNTER — Inpatient Hospital Stay: Payer: Medicare HMO

## 2019-02-03 VITALS — BP 150/79 | HR 100 | Temp 97.1°F | Resp 16

## 2019-02-03 DIAGNOSIS — C7982 Secondary malignant neoplasm of genital organs: Secondary | ICD-10-CM | POA: Diagnosis not present

## 2019-02-03 DIAGNOSIS — Z5111 Encounter for antineoplastic chemotherapy: Secondary | ICD-10-CM | POA: Diagnosis not present

## 2019-02-03 DIAGNOSIS — C773 Secondary and unspecified malignant neoplasm of axilla and upper limb lymph nodes: Secondary | ICD-10-CM | POA: Diagnosis not present

## 2019-02-03 DIAGNOSIS — C541 Malignant neoplasm of endometrium: Secondary | ICD-10-CM | POA: Diagnosis not present

## 2019-02-03 DIAGNOSIS — D61818 Other pancytopenia: Secondary | ICD-10-CM | POA: Insufficient documentation

## 2019-02-03 DIAGNOSIS — Z90722 Acquired absence of ovaries, bilateral: Secondary | ICD-10-CM | POA: Diagnosis not present

## 2019-02-03 DIAGNOSIS — Z5112 Encounter for antineoplastic immunotherapy: Secondary | ICD-10-CM | POA: Diagnosis not present

## 2019-02-03 DIAGNOSIS — Z90710 Acquired absence of both cervix and uterus: Secondary | ICD-10-CM | POA: Diagnosis not present

## 2019-02-03 DIAGNOSIS — Z7189 Other specified counseling: Secondary | ICD-10-CM

## 2019-02-03 DIAGNOSIS — Z853 Personal history of malignant neoplasm of breast: Secondary | ICD-10-CM | POA: Diagnosis not present

## 2019-02-03 DIAGNOSIS — N183 Chronic kidney disease, stage 3 (moderate): Secondary | ICD-10-CM | POA: Diagnosis not present

## 2019-02-03 MED ORDER — DIPHENHYDRAMINE HCL 50 MG/ML IJ SOLN
INTRAMUSCULAR | Status: AC
  Filled 2019-02-03: qty 1

## 2019-02-03 MED ORDER — ACETAMINOPHEN 325 MG PO TABS
ORAL_TABLET | ORAL | Status: AC
  Filled 2019-02-03: qty 2

## 2019-02-03 MED ORDER — ACETAMINOPHEN 325 MG PO TABS
650.0000 mg | ORAL_TABLET | Freq: Once | ORAL | Status: AC
  Administered 2019-02-03: 650 mg via ORAL

## 2019-02-03 MED ORDER — PALONOSETRON HCL INJECTION 0.25 MG/5ML
INTRAVENOUS | Status: AC
  Filled 2019-02-03: qty 5

## 2019-02-03 MED ORDER — SODIUM CHLORIDE 0.9 % IV SOLN
135.0000 mg/m2 | Freq: Once | INTRAVENOUS | Status: AC
  Administered 2019-02-03: 222 mg via INTRAVENOUS
  Filled 2019-02-03: qty 37

## 2019-02-03 MED ORDER — SODIUM CHLORIDE 0.9 % IV SOLN
Freq: Once | INTRAVENOUS | Status: AC
  Administered 2019-02-03: 09:00:00 via INTRAVENOUS
  Filled 2019-02-03: qty 5

## 2019-02-03 MED ORDER — FAMOTIDINE IN NACL 20-0.9 MG/50ML-% IV SOLN
20.0000 mg | Freq: Once | INTRAVENOUS | Status: AC
  Administered 2019-02-03: 20 mg via INTRAVENOUS

## 2019-02-03 MED ORDER — PALONOSETRON HCL INJECTION 0.25 MG/5ML
0.2500 mg | Freq: Once | INTRAVENOUS | Status: AC
  Administered 2019-02-03: 0.25 mg via INTRAVENOUS

## 2019-02-03 MED ORDER — FAMOTIDINE IN NACL 20-0.9 MG/50ML-% IV SOLN
INTRAVENOUS | Status: AC
  Filled 2019-02-03: qty 50

## 2019-02-03 MED ORDER — SODIUM CHLORIDE 0.9% FLUSH
10.0000 mL | INTRAVENOUS | Status: DC | PRN
  Administered 2019-02-03: 10 mL
  Filled 2019-02-03: qty 10

## 2019-02-03 MED ORDER — SODIUM CHLORIDE 0.9 % IV SOLN
300.0000 mg | Freq: Once | INTRAVENOUS | Status: AC
  Administered 2019-02-03: 300 mg via INTRAVENOUS
  Filled 2019-02-03: qty 30

## 2019-02-03 MED ORDER — TRASTUZUMAB CHEMO 150 MG IV SOLR
6.0000 mg/kg | Freq: Once | INTRAVENOUS | Status: AC
  Administered 2019-02-03: 09:00:00 378 mg via INTRAVENOUS
  Filled 2019-02-03: qty 18

## 2019-02-03 MED ORDER — SODIUM CHLORIDE 0.9 % IV SOLN
Freq: Once | INTRAVENOUS | Status: AC
  Administered 2019-02-03: 09:00:00 via INTRAVENOUS
  Filled 2019-02-03: qty 250

## 2019-02-03 MED ORDER — HEPARIN SOD (PORK) LOCK FLUSH 100 UNIT/ML IV SOLN
500.0000 [IU] | Freq: Once | INTRAVENOUS | Status: AC | PRN
  Administered 2019-02-03: 500 [IU]
  Filled 2019-02-03: qty 5

## 2019-02-03 MED ORDER — DIPHENHYDRAMINE HCL 50 MG/ML IJ SOLN
50.0000 mg | Freq: Once | INTRAMUSCULAR | Status: AC
  Administered 2019-02-03: 09:00:00 50 mg via INTRAVENOUS

## 2019-02-03 NOTE — Assessment & Plan Note (Signed)
This is due to recent treatment She is not symptomatic Observe only 

## 2019-02-03 NOTE — Patient Instructions (Signed)
[facility] Discharge Instructions for Patients Receiving Chemotherapy  Today you received the following chemotherapy agents:  [name], Taxol, and Carboplatin.  To help prevent nausea and vomiting after your treatment, we encourage you to take your nausea medication as directed.   If you develop nausea and vomiting that is not controlled by your nausea medication, call the clinic.   BELOW ARE SYMPTOMS THAT SHOULD BE REPORTED IMMEDIATELY:  *FEVER GREATER THAN 100.5 F  *CHILLS WITH OR WITHOUT FEVER  NAUSEA AND VOMITING THAT IS NOT CONTROLLED WITH YOUR NAUSEA MEDICATION  *UNUSUAL SHORTNESS OF BREATH  *UNUSUAL BRUISING OR BLEEDING  TENDERNESS IN MOUTH AND THROAT WITH OR WITHOUT PRESENCE OF ULCERS  *URINARY PROBLEMS  *BOWEL PROBLEMS  UNUSUAL RASH Items with * indicate a potential emergency and should be followed up as soon as possible.  Feel free to call the clinic should you have any questions or concerns. The clinic phone number is [phone].  Please show the [facility] at check-in to the Emergency Department and triage nurse.

## 2019-02-03 NOTE — Assessment & Plan Note (Signed)
Renal function is stable.  We will continue to adjust dose of her chemotherapy as needed

## 2019-02-03 NOTE — Assessment & Plan Note (Signed)
So far, she tolerated chemotherapy very well with minimum side effects apart from anemia only We will repeat another imaging study in September In the meantime, we will continue chemotherapy as scheduled Her recent echocardiogram showed no evidence of cardiomyopathy

## 2019-02-03 NOTE — Progress Notes (Signed)
[place] OFFICE PROGRESS NOTE  Patient Care Team: Burnard Bunting, [name]  ASSESSMENT & PLAN:  Endometrial cancer [facility]) So far, she tolerated chemotherapy very well with minimum side effects apart from anemia only We will repeat another imaging study in September In the meantime, we will continue chemotherapy as scheduled Her recent echocardiogram showed no evidence of cardiomyopathy  Pancytopenia, acquired ([place]) This is due to recent treatment She is not symptomatic Observe only  CKD (chronic kidney disease), stage III ([place]) Renal function is stable.  We will continue to adjust dose of her chemotherapy as needed   No orders of the defined types were placed in this encounter.   INTERVAL HISTORY: Please see below for problem oriented charting. 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 Her cancer was treated with surgery but no radiation or chemotherapy.   Serous endometrial cancer, MSI stable ER positive, PR neg, Her2/neu 3+      Endometrial cancer ([place])  08/29/2016 Pathology Results   Endometrium, biopsy - HIGH GRADE ENDOMETRIAL CARCINOMA, SEE COMMENT. Microscopic Comment The sections show multiple fragments of adenocarcinoma displaying glandular and papillary patterns associated with high grade cytomorphology characterized by nuclear pleomorphism, prominent nucleoli and brisk mitosis. Immunohistochemical stains show that the tumor cells are positive for vimentin, p16, p53 with increased Ki-67 expression. Estrogen and progesterone receptor stains show patchy weak positivity. No  significant positivity is seen with CEA. The findings are consistent with high grade endometrial carcinoma and the overall morphology and phenotypic features favor serous carcinoma.   08/29/2016 Initial Diagnosis   She presented with postmenopausal bleeding   10/03/2016 Imaging   CT C/A/P 09/2016 IMPRESSION: 1. Thickening of the endometrial canal up to 19 mm in fundus, presumably corresponding to the patient's reported endometrial carcinoma. 2. Multiple tiny pulmonary nodules scattered throughout the lungs bilaterally measuring 4 mm or less in size. Nodules of this size are typically considered statistically likely benign. In the setting of known primary malignancy, metastatic disease to the lungs is not excluded, but is not strongly favored on today's examination. Attention on followup studies is recommended to ensure the stability or resolution of these nodules. 3. Subcentimeter low-attenuation lesion in the central aspect of segment 8 of the liver is too small to characterize. This is statistically likely a tiny cyst, but warrants attention on follow-up studies to exclude the possibility of a solitary hepatic metastasis. 4. 1.5 x 1.5 x 1.7 cm well-circumscribed lesion in the proximal stomach. This is of uncertain etiology and significance, and could represent a benign gastric polyp, however, further evaluation with nonemergent endoscopy is suggested in the near future for further evaluation. 5. **An incidental finding of potential clinical significance has been found. 1.1 x 1.6 cm thyroid nodule in the inferior aspect of the right lobe of the thyroid gland. Follow-up evaluation with nonemergent thyroid ultrasound is recommended in the near future to better evaluate this finding. This recommendation follows ACR consensuss guidelines: Managing Incidental Thyroid Nodules Detected on Imaging: White Paper of the ACR Incidental Thyroid Findings Committee. J Am Coll Radiol 2015;12(2):143-150.** 6. Aortic  atherosclerosis, in addition to left anterior descending coronary  artery disease   10/22/2016 Surgery   Robotic assisted total hysterectomy, BSO and bilateral pelvic lymphadenectomy  Final pathology revealed a 3cm polyp containing serous carcinoma but with no myometrial invasion, no LVSI and negative nodes.  Stage IA Uterine serous cancer   10/22/2016 Pathology Results   1. Lymph nodes, regional resection, right pelvic - SIX BENIGN LYMPH NODES (0/6). 2. Lymph nodes, regional resection, left pelvic - SEVEN BENIGN LYMPH NODES (0/7). 3. Uterus +/- tubes/ovaries, neoplastic, with right ovary and fallopian tube ENDOMYOMETRIUM - SEROUS CARCINOMA ARISING WITHIN AN ENDOMETRIAL POLYP - NO MYOMETRIAL INVASION IDENTIFIED - ADENOMYOSIS - LEIOMYOMA (1 CM) - SEE ONCOLOGY TABLE AND COMMENT CERVIX - CARCINOMA FOCALLY INVOLVES ENDOCERVICAL GLANDS - NABOTHIAN CYSTS RIGHT ADNEXA - BENIGN OVARY AND FALLOPIAN TUBE - NO CARCINOMA IDENTIFIED 4. Cul-de-sac biopsy - MESOTHELIAL HYPERPLASIA Microscopic Comment 3. ONCOLOGY TABLE-UTERUS, CARCINOMA OR CARCINOSARCOMA Specimen: Uterus, right fallopian tube and ovary Procedure: Total hysterectomy and right salpingo-oophorectomy Lymph node sampling performed: Bilateral pelvic regional resection Specimen integrity: Intact Maximum tumor size: 3 cm (polyp) Histologic type: Serous carcinoma Grade: High grade Myometrial invasion: Not identified Cervical stromal involvement: No, focal endocervical gland involvement Extent of involvement of other organs: Not identified Lymph - vascular invasion: Not identified Peritoneal washings: N/A Lymph nodes: Examined: 0 Sentinel 13 Non-sentinel 13 Total Lymph nodes with metastasis: 0 Isolated tumor cells (< 0.2 mm): 0 Micrometastasis: (> 0.2 mm and < 2.0 mm): 0 Macrometastasis: (> 2.0 mm): 0 Extracapsular extension: N/A Pelvic lymph nodes: 0 involved of 13 lymph nodes. Para-aortic lymph nodes: No para-aortic nodes  submitted TNM code: pT1a, pNX FIGO Stage (based on pathologic findings, needs clinical correlation): IA Comment: Immunohistochemistry for cytokeratin AE1/AE3 is performed on all of the lymph nodes (parts 1 & 2) and no metastatic carcinoma is identified.   07/04/2017 Genetic Testing   The patient had genetic testing due to a personal history of breast and uterine cancer, and a family history of stomach cancer.  The Multi-Cancer Panel was ordered. The Multi-Cancer Panel offered by [facility] includes sequencing and/or deletion duplication testing of the following 83 genes: ALK, APC, ATM, AXIN2,BAP1,  BARD1, BLM, BMPR1A, BRCA1, BRCA2, BRIP1, CASR, CDC73, CDH1, CDK4, CDKN1B, CDKN1C, CDKN2A (p14ARF), CDKN2A (p16INK4a), CEBPA, CHEK2, CTNNA1, DICER1, DIS3L2, EGFR (c.2369C>T, p.Thr790Met variant only), EPCAM (Deletion/duplication testing only), FH, FLCN, GATA2, GPC3, GREM1 (Promoter region deletion/duplication testing only), HOXB13 (c.251G>A, p.Gly84Glu), HRAS, KIT, MAX, MEN1, MET, MITF (c.952G>A, p.Glu318Lys variant only), MLH1, MSH2, MSH3, MSH6, MUTYH, NBN, NF1, NF2, NTHL1, PALB2, PDGFRA, PHOX2B, PMS2, POLD1, POLE, POT1, PRKAR1A, PTCH1, PTEN, RAD50, RAD51C, RAD51D, RB1, RECQL4, RET, RUNX1, SDHAF2, SDHA (sequence changes only), SDHB, SDHC, SDHD, SMAD4, SMARCA4, SMARCB1, SMARCE1, STK11, SUFU, TERC, TERT, TMEM127, TP53, TSC1, TSC2, VHL, WRN and WT1.   Results: No pathogenic variants were identified.  A variant of uncertain significance in the gene APC was identified.  c.791A>G (p.Gln264Arg).  The date of this test report is 07/04/2017.     Genetic Testing   Patient has genetic testing done for MSI. 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    12/03/2017 Imaging   CT Chest/Abd/Pelvis to follow pulmonary nodule and gastric mass IMPRESSION: 1. Stable CT of the chest. Small pulmonary nodules are unchanged when compared with previous exam. 2. No new findings identified. 3.  Subcentimeter low-attenuation lesions within the liver are remain too small to characterize but are stable from prior exam. 4. Persistent indeterminate low-attenuation structure within the proximal stomach is unchanged measuring 1.4 cm. Correlation with direct  visualization is advised   06/30/2018 Imaging   CT CHEST Lungs/Pleura: Stable scattered sub-cm pulmonary nodules are again seen bilaterally and are stable compared to previous studies. No new or enlarging pulmonary nodules or masses identified. No evidence of pulmonary infiltrate or pleural effusion.   08/19/2018 Echocardiogram   [place] done in [place]: EF 60-65%. Mild impaired relaxation. (report scanned)   09/17/2018 Relapse/Recurrence   Presented with c/o vagina discharge.  Lesion noted at the left vaginal apex 43m lesion removed Path c/w high grade serous cancer   09/17/2018 Pathology Results   Vagina, biopsy, left apex - HIGH GRADE SEROUS CARCINOMA.   09/28/2018 PET scan   1. Two hypermetabolic axial lymph nodes. Unusual site for metastatic endometrial carcinoma however the activity is more intense than typically seen in reactive adenopathy. Suggest ultrasound-guided percutaneous biopsy of the larger RIGHT axial lymph node 2. No evidence of local recurrence at the vaginal cuff.  3. No metastatic adenopathy in the abdomen or pelvis. 4. Stable small pulmonary nodules   10/09/2018 Pathology Results   Lymph node, needle/core biopsy, right axilla - METASTATIC CARCINOMA, SEE COMMENT. Microscopic Comment The carcinoma appears high grade. Immunohistochemistry is positive for cytokeratin 7, PAX-8, and ER. Cytokeratin 20, CDX-2, PR, and GATA-3 are negative. The findings along with the history are consistent with a gynecologic primary.    10/09/2018 Procedure   Ultrasound-guided core biopsies of a right axillary lymph node.   10/14/2018 Cancer Staging   Staging form: Corpus Uteri - Carcinoma and Carcinosarcoma, AJCC 8th Edition - Clinical: Stage  IVB (cT1, cN0, pM1) - Signed by GHeath Lark MD on 10/14/2018   10/19/2018 Imaging   Placement of single lumen port a cath via right internal jugular vein. The catheter tip lies at the cavo-atrial junction. A power injectable port a cath was placed and is ready for immediate use.    10/21/2018 -  Chemotherapy   The patient had carboplatin and taxol for treatment   11/11/2018 -  Chemotherapy   The patient had trastuzumab ([name]) 450 mg in sodium chloride 0.9 % 250 mL chemo infusion, 483 mg, Intravenous,  Once, 5 of 5 cycles Administration: 450 mg (11/11/2018), 378 mg (12/02/2018), 378 mg (02/03/2019), 378 mg (12/23/2018), 378 mg (01/13/2019)  for chemotherapy treatment.    12/21/2018 PET scan   1. Interval resolution of hypermetabolic right axillary lymph nodes. No metabolic findings highly suspicious for recurrent metastatic disease. 2. New mild hypermetabolism within a borderline prominent portacaval lymph node, nonspecific. While a reactive node is favored, a lymph node metastasis cannot be entirely excluded. Suggest attention to this lymph node on follow-up PET-CT in 3-6 months. 3. Nonspecific new hypermetabolism at the ileocecal valve, more likely physiologic given absence of CT correlate. 4. Scattered subcentimeter pulmonary nodules are all stable and below PET resolution, more likely benign, continued CT surveillance advised. 5. Chronic findings include: Aortic Atherosclerosis (ICD10-I70.0). Marked diffuse colonic diverticulosis. Coronary atherosclerosis.    12/28/2018 Echocardiogram   1. The left ventricle has normal systolic function with an ejection fraction of 60-65%. The cavity size was normal. Left ventricular diastolic Doppler parameters are consistent with impaired relaxation.  2. GLS recorded as -10.7 but LV appears hyperdynamic and tracking of endocardium appears poor.  3. The right ventricle has normal systolic function. The cavity was normal. There is no increase in right ventricular  wall thickness.  4. Mild thickening of the mitral valve leaflet.  5. The aortic valve was not well visualized. Mild thickening of the aortic valve. Aortic  valve regurgitation is trivial by color flow Doppler.   Metastasis to lymph nodes ([place])  10/14/2018 Initial Diagnosis   Metastasis to lymph nodes ([place])   10/21/2018 -  Chemotherapy   The patient had palonosetron (ALOXI) injection 0.25 mg, 0.25 mg, Intravenous,  Once, 6 of 6 cycles Administration: 0.25 mg (10/21/2018), 0.25 mg (11/11/2018), 0.25 mg (12/02/2018), 0.25 mg (12/23/2018), 0.25 mg (01/13/2019), 0.25 mg (02/03/2019) CARBOplatin (PARAPLATIN) 310 mg in sodium chloride 0.9 % 250 mL chemo infusion, 310 mg, Intravenous,  Once, 6 of 6 cycles Dose modification: 300 mg (original dose 311.5 mg, Cycle 4, Reason: Dose not tolerated) Administration: 310 mg (10/21/2018), 300 mg (11/11/2018), 300 mg (12/02/2018), 300 mg (12/23/2018), 300 mg (01/13/2019) PACLitaxel (TAXOL) 222 mg in sodium chloride 0.9 % 250 mL chemo infusion (> 72m/m2), 135 mg/m2 = 222 mg, Intravenous,  Once, 6 of 6 cycles Administration: 222 mg (10/21/2018), 222 mg (11/11/2018), 222 mg (12/02/2018), 222 mg (12/23/2018), 222 mg (01/13/2019), 222 mg (02/03/2019) fosaprepitant (EMEND) 150 mg, dexamethasone (DECADRON) 12 mg in sodium chloride 0.9 % 145 mL IVPB, , Intravenous,  Once, 6 of 6 cycles Administration:  (10/21/2018),  (11/11/2018),  (12/02/2018),  (12/23/2018),  (01/13/2019),  (02/03/2019)  for chemotherapy treatment.      REVIEW OF SYSTEMS:   Constitutional: Denies fevers, chills or abnormal weight loss Eyes: Denies blurriness of vision Ears, nose, mouth, throat, and face: Denies mucositis or sore throat Respiratory: Denies cough, dyspnea or wheezes Cardiovascular: Denies palpitation, chest discomfort or lower extremity swelling Gastrointestinal:  Denies nausea, heartburn or change in bowel habits Skin: Denies abnormal skin rashes Lymphatics: Denies new lymphadenopathy or easy  bruising Neurological:Denies numbness, tingling or new weaknesses Behavioral/Psych: Mood is stable, no new changes  All other systems were reviewed with the patient and are negative.  I have reviewed the past medical history, past surgical history, social history and family history with the patient and they are unchanged from previous note.  ALLERGIES:  has No Known Allergies.  MEDICATIONS:  Current Outpatient Medications  Medication Sig Dispense Refill  . acetaminophen (TYLENOL) 500 MG tablet Take 1,000 mg by mouth every 6 (six) hours as needed.    .Marland Kitchenaspirin EC 81 MG tablet Take 81 mg by mouth daily.    .Marland Kitchenatorvastatin (LIPITOR) 20 MG tablet Take 20 mg by mouth every evening.     . Calcium Carbonate-Vitamin D (CALCIUM-VITAMIN D) 500-200 MG-UNIT per tablet Take 1 tablet by mouth daily.    . citalopram (CELEXA) 20 MG tablet Take 20 mg by mouth at bedtime.     .Marland Kitchendexamethasone (DECADRON) 4 MG tablet Take 2 tabs at the night before and 2 tabs the morning of chemotherapy, every 3 weeks, by mouth 12 tablet 0  . levothyroxine (SYNTHROID, LEVOTHROID) 100 MCG tablet Take 100 mcg by mouth daily before breakfast.     . lidocaine-prilocaine (EMLA) cream Apply to affected area once 30 g 3  . losartan-hydrochlorothiazide (HYZAAR) 100-25 MG tablet     . metoprolol succinate (TOPROL-XL) 25 MG 24 hr tablet Take 25 mg by mouth daily.    . Multiple Vitamin (MULTIVITAMIN WITH MINERALS) TABS Take 1 tablet by mouth daily.    . ondansetron (ZOFRAN) 8 MG tablet Take 1 tablet (8 mg total) by mouth every 8 (eight) hours as needed. Start on the third day after chemotherapy. 30 tablet 1  . oxyCODONE (OXY IR/ROXICODONE) 5 MG immediate release tablet Take 1 tablet (5 mg total) by mouth every 6 (six) hours as needed for severe  pain. 30 tablet 0  . pantoprazole (PROTONIX) 40 MG tablet Take 40 mg by mouth every morning.     . prochlorperazine (COMPAZINE) 10 MG tablet Take 1 tablet (10 mg total) by mouth every 6 (six) hours  as needed (Nausea or vomiting). 30 tablet 1  . valACYclovir (VALTREX) 1000 MG tablet valacyclovir 1 gram tablet     No current facility-administered medications for this visit.    Facility-Administered Medications Ordered in Other Visits  Medication Dose Route Frequency Provider Last Rate Last Dose  . CARBOplatin (PARAPLATIN) 300 mg in sodium chloride 0.9 % 250 mL chemo infusion  300 mg Intravenous Once [name], MD 560 mL/hr at 02/03/19 1319 300 mg at 02/03/19 1319  . heparin lock flush 100 unit/mL  500 Units Intracatheter Once PRN Alvy Bimler, Zahniya Zellars, MD      . sodium chloride flush (NS) 0.9 % injection 10 mL  10 mL Intracatheter PRN Alvy Bimler, Glennice Marcos, MD        PHYSICAL EXAMINATION: ECOG PERFORMANCE STATUS: 1 - Symptomatic but completely ambulatory  Vitals:   02/02/19 0951  BP: (!) 143/63  Pulse: 76  Resp: 18  Temp: 98.7 F (37.1 C)   Filed Weights   02/02/19 0951  Weight: 134 lb 6.4 oz (61 kg)    GENERAL:alert, no distress and comfortable SKIN: skin color, texture, turgor are normal, no rashes or significant lesions EYES: normal, Conjunctiva are pink and non-injected, sclera clear OROPHARYNX:no exudate, no erythema and lips, buccal mucosa, and tongue normal  NECK: supple, thyroid normal size, non-tender, without nodularity LYMPH:  no palpable lymphadenopathy in the cervical, axillary or inguinal LUNGS: clear to auscultation and percussion with normal breathing effort HEART: regular rate & rhythm and no murmurs and no lower extremity edema ABDOMEN:abdomen soft, non-tender and normal bowel sounds Musculoskeletal:no cyanosis of digits and no clubbing  NEURO: alert & oriented x 3 with fluent speech, no focal motor/sensory deficits  LABORATORY DATA:  I have reviewed the data as listed    Component Value Date/Time   NA 140 02/02/2019 0912   NA 139 02/13/2017 1512   K 3.9 02/02/2019 0912   K 4.2 02/13/2017 1512   CL 102 02/02/2019 0912   CO2 26 02/02/2019 0912   CO2 28 02/13/2017  1512   GLUCOSE 108 (H) 02/02/2019 0912   GLUCOSE 118 02/13/2017 1512   BUN 23 02/02/2019 0912   BUN 25.2 02/13/2017 1512   CREATININE 1.24 (H) 02/02/2019 0912   CREATININE 1.2 (H) 02/13/2017 1512   CALCIUM 9.1 02/02/2019 0912   CALCIUM 10.0 02/13/2017 1512   PROT 6.9 02/02/2019 0912   ALBUMIN 3.9 02/02/2019 0912   AST 21 02/02/2019 0912   ALT 15 02/02/2019 0912   ALKPHOS 78 02/02/2019 0912   BILITOT 0.3 02/02/2019 0912   GFRNONAA 41 (L) 02/02/2019 0912   GFRAA 48 (L) 02/02/2019 0912    No results found for: SPEP, UPEP  Lab Results  Component Value Date   WBC 5.2 02/02/2019   NEUTROABS 3.0 02/02/2019   HGB 9.4 (L) 02/02/2019   HCT 26.4 (L) 02/02/2019   MCV 97.4 02/02/2019   PLT 130 (L) 02/02/2019      Chemistry      Component Value Date/Time   NA 140 02/02/2019 0912   NA 139 02/13/2017 1512   K 3.9 02/02/2019 0912   K 4.2 02/13/2017 1512   CL 102 02/02/2019 0912   CO2 26 02/02/2019 0912   CO2 28 02/13/2017 1512   BUN 23 02/02/2019  0912   BUN 25.2 02/13/2017 1512   CREATININE 1.24 (H) 02/02/2019 0912   CREATININE 1.2 (H) 02/13/2017 1512      Component Value Date/Time   CALCIUM 9.1 02/02/2019 0912   CALCIUM 10.0 02/13/2017 1512   ALKPHOS 78 02/02/2019 0912   AST 21 02/02/2019 0912   ALT 15 02/02/2019 0912   BILITOT 0.3 02/02/2019 0912     All questions were answered. The patient knows to call the clinic with any problems, questions or concerns. No barriers to learning was detected.  I spent 15 minutes counseling the patient face to face. The total time spent in the appointment was 20 minutes and more than 50% was on counseling and review of test results  [name], MD 02/03/2019 1:42 PM

## 2019-02-23 ENCOUNTER — Encounter: Payer: Self-pay | Admitting: Hematology and Oncology

## 2019-02-23 ENCOUNTER — Inpatient Hospital Stay: Payer: Medicare HMO | Attending: Gynecologic Oncology

## 2019-02-23 ENCOUNTER — Inpatient Hospital Stay (HOSPITAL_BASED_OUTPATIENT_CLINIC_OR_DEPARTMENT_OTHER): Payer: Medicare HMO | Admitting: Hematology and Oncology

## 2019-02-23 ENCOUNTER — Other Ambulatory Visit: Payer: Self-pay

## 2019-02-23 VITALS — BP 145/77 | HR 89 | Temp 98.0°F | Resp 18 | Ht 62.2 in | Wt 134.4 lb

## 2019-02-23 DIAGNOSIS — Z5112 Encounter for antineoplastic immunotherapy: Secondary | ICD-10-CM | POA: Diagnosis not present

## 2019-02-23 DIAGNOSIS — Z170 Estrogen receptor positive status [ER+]: Secondary | ICD-10-CM | POA: Insufficient documentation

## 2019-02-23 DIAGNOSIS — Z5111 Encounter for antineoplastic chemotherapy: Secondary | ICD-10-CM | POA: Insufficient documentation

## 2019-02-23 DIAGNOSIS — D61818 Other pancytopenia: Secondary | ICD-10-CM | POA: Insufficient documentation

## 2019-02-23 DIAGNOSIS — C773 Secondary and unspecified malignant neoplasm of axilla and upper limb lymph nodes: Secondary | ICD-10-CM

## 2019-02-23 DIAGNOSIS — R918 Other nonspecific abnormal finding of lung field: Secondary | ICD-10-CM

## 2019-02-23 DIAGNOSIS — C541 Malignant neoplasm of endometrium: Secondary | ICD-10-CM | POA: Diagnosis not present

## 2019-02-23 DIAGNOSIS — Z7189 Other specified counseling: Secondary | ICD-10-CM

## 2019-02-23 DIAGNOSIS — Z79899 Other long term (current) drug therapy: Secondary | ICD-10-CM | POA: Diagnosis not present

## 2019-02-23 DIAGNOSIS — Z90710 Acquired absence of both cervix and uterus: Secondary | ICD-10-CM | POA: Diagnosis not present

## 2019-02-23 DIAGNOSIS — N183 Chronic kidney disease, stage 3 unspecified: Secondary | ICD-10-CM

## 2019-02-23 DIAGNOSIS — Z7982 Long term (current) use of aspirin: Secondary | ICD-10-CM | POA: Insufficient documentation

## 2019-02-23 DIAGNOSIS — Z853 Personal history of malignant neoplasm of breast: Secondary | ICD-10-CM | POA: Diagnosis not present

## 2019-02-23 LAB — CBC WITH DIFFERENTIAL (CANCER CENTER ONLY)
Abs Immature Granulocytes: 0.05 10*3/uL (ref 0.00–0.07)
Basophils Absolute: 0 10*3/uL (ref 0.0–0.1)
Basophils Relative: 1 %
Eosinophils Absolute: 0.1 10*3/uL (ref 0.0–0.5)
Eosinophils Relative: 1 %
HCT: 26.5 % — ABNORMAL LOW (ref 36.0–46.0)
Hemoglobin: 9.3 g/dL — ABNORMAL LOW (ref 12.0–15.0)
Immature Granulocytes: 1 %
Lymphocytes Relative: 37 %
Lymphs Abs: 1.6 10*3/uL (ref 0.7–4.0)
MCH: 35.4 pg — ABNORMAL HIGH (ref 26.0–34.0)
MCHC: 35.1 g/dL (ref 30.0–36.0)
MCV: 100.8 fL — ABNORMAL HIGH (ref 80.0–100.0)
Monocytes Absolute: 0.6 10*3/uL (ref 0.1–1.0)
Monocytes Relative: 13 %
Neutro Abs: 2 10*3/uL (ref 1.7–7.7)
Neutrophils Relative %: 47 %
Platelet Count: 125 10*3/uL — ABNORMAL LOW (ref 150–400)
RBC: 2.63 MIL/uL — ABNORMAL LOW (ref 3.87–5.11)
RDW: 13.8 % (ref 11.5–15.5)
WBC Count: 4.4 10*3/uL (ref 4.0–10.5)
nRBC: 0 % (ref 0.0–0.2)

## 2019-02-23 LAB — CMP (CANCER CENTER ONLY)
ALT: 22 U/L (ref 0–44)
AST: 24 U/L (ref 15–41)
Albumin: 4.2 g/dL (ref 3.5–5.0)
Alkaline Phosphatase: 71 U/L (ref 38–126)
Anion gap: 12 (ref 5–15)
BUN: 21 mg/dL (ref 8–23)
CO2: 27 mmol/L (ref 22–32)
Calcium: 8.3 mg/dL — ABNORMAL LOW (ref 8.9–10.3)
Chloride: 101 mmol/L (ref 98–111)
Creatinine: 1.2 mg/dL — ABNORMAL HIGH (ref 0.44–1.00)
GFR, Est AFR Am: 50 mL/min — ABNORMAL LOW (ref >60)
GFR, Estimated: 43 mL/min — ABNORMAL LOW (ref >60)
Glucose, Bld: 118 mg/dL — ABNORMAL HIGH (ref 70–99)
Potassium: 3.3 mmol/L — ABNORMAL LOW (ref 3.5–5.1)
Sodium: 140 mmol/L (ref 135–145)
Total Bilirubin: 0.7 mg/dL (ref 0.3–1.2)
Total Protein: 7.2 g/dL (ref 6.5–8.1)

## 2019-02-23 NOTE — Assessment & Plan Note (Signed)
Renal function is stable.  We will continue to adjust dose of her chemotherapy as needed

## 2019-02-23 NOTE — Assessment & Plan Note (Signed)
She is getting progressively pancytopenic with treatment and she is symptomatic We will proceed with chemotherapy tomorrow as scheduled After that, I plan to repeat PET CT scan early September for objective assessment of response to therapy If she has no evidence of residual disease, we will switch her to maintenance [name] only The patient would like an extra week of break before I see her back and I think it is reasonable

## 2019-02-23 NOTE — Assessment & Plan Note (Signed)
The pulmonary nodules are stable We will continue to observe with serial imaging study.

## 2019-02-23 NOTE — Assessment & Plan Note (Signed)
She has mild progressive pancytopenia due to side effects of treatment She is mildly symptomatic but does not require blood transfusion support After her current treatment, I recommend repeat imaging study as above We will discontinue chemotherapy after tomorrow's dose if she have positive response to therapy

## 2019-02-23 NOTE — Progress Notes (Signed)
[place] OFFICE PROGRESS NOTE  Patient Care Team: Burnard Bunting, [name]  ASSESSMENT & PLAN:  Endometrial cancer [facility]) She is getting progressively pancytopenic with treatment and she is symptomatic We will proceed with chemotherapy tomorrow as scheduled After that, I plan to repeat PET CT scan early September for objective assessment of response to therapy If she has no evidence of residual disease, we will switch her to maintenance [name] only The patient would like an extra week of break before I see her back and I think it is reasonable   Pancytopenia, acquired ([place]) She has mild progressive pancytopenia due to side effects of treatment She is mildly symptomatic but does not require blood transfusion support After her current treatment, I recommend repeat imaging study as above We will discontinue chemotherapy after tomorrow's dose if she have positive response to therapy  CKD (chronic kidney disease), stage III ([place]) Renal function is stable.  We will continue to adjust dose of her chemotherapy as needed  Pulmonary nodules/lesions, multiple The pulmonary nodules are stable We will continue to observe with serial imaging study.   Orders Placed This Encounter  Procedures  . NM PET Image Restag (PS) Skull Base To Thigh    Standing Status:   Future    Standing Expiration Date:   02/23/2020    Order Specific Question:   If indicated for the ordered procedure, I authorize the administration of a radiopharmaceutical per Radiology protocol    Answer:   Yes    Order Specific Question:   Preferred imaging location?    Answer:   [facility]    Order Specific Question:   Radiology Contrast Protocol - do NOT remove file path    Answer:   [email or web address]    INTERVAL HISTORY: Please see below for problem oriented charting. She returns for cycle 7 of chemotherapy She complains of progressive  fatigue with each dose of treatment Denies neuropathy No recent changes in bowel habits Denies recent cough, chest pain or shortness of breath The patient denies any recent signs or symptoms of bleeding such as spontaneous epistaxis, hematuria or hematochezia.   SUMMARY OF ONCOLOGIC HISTORY: Oncology History Overview Note  AEMILIA DEDRICK  has a remote history of left  breast cancer at age 50 but received BRCA testing approximately 5 years ago which was negative. Her cancer was treated with surgery but no radiation or chemotherapy.   Serous endometrial cancer, MSI stable ER positive, PR neg, Her2/neu 3+      Endometrial cancer ([place])  08/29/2016 Pathology Results   Endometrium, biopsy - HIGH GRADE ENDOMETRIAL CARCINOMA, SEE COMMENT. Microscopic Comment The sections show multiple fragments of adenocarcinoma displaying glandular and papillary patterns associated with high grade cytomorphology characterized by nuclear pleomorphism, prominent nucleoli and brisk mitosis. Immunohistochemical stains show that the tumor cells are positive for vimentin, p16, p53 with increased Ki-67 expression. Estrogen and progesterone receptor stains show patchy weak positivity. No significant positivity is seen with CEA. The findings are consistent with high grade endometrial carcinoma and the overall morphology and phenotypic features favor serous carcinoma.   08/29/2016 Initial Diagnosis   She presented with postmenopausal bleeding   10/03/2016 Imaging   CT C/A/P 09/2016 IMPRESSION: 1. Thickening of the endometrial canal up to 19 mm in fundus, presumably corresponding to the patient's reported endometrial carcinoma. 2. Multiple tiny pulmonary nodules scattered throughout the lungs bilaterally measuring 4 mm or less in size. Nodules of this size are typically  considered statistically likely benign. In the setting of known primary malignancy, metastatic disease to the lungs is not excluded, but is not strongly  favored on today's examination. Attention on followup studies is recommended to ensure the stability or resolution of these nodules. 3. Subcentimeter low-attenuation lesion in the central aspect of segment 8 of the liver is too small to characterize. This is statistically likely a tiny cyst, but warrants attention on follow-up studies to exclude the possibility of a solitary hepatic metastasis. 4. 1.5 x 1.5 x 1.7 cm well-circumscribed lesion in the proximal stomach. This is of uncertain etiology and significance, and could represent a benign gastric polyp, however, further evaluation with nonemergent endoscopy is suggested in the near future for further evaluation. 5. **An incidental finding of potential clinical significance has been found. 1.1 x 1.6 cm thyroid nodule in the inferior aspect of the right lobe of the thyroid gland. Follow-up evaluation with nonemergent thyroid ultrasound is recommended in the near future to better evaluate this finding. This recommendation follows ACR consensuss guidelines: Managing Incidental Thyroid Nodules Detected on Imaging: White Paper of the ACR Incidental Thyroid Findings Committee. J Am Coll Radiol 2015;12(2):143-150.** 6. Aortic atherosclerosis, in addition to left anterior descending coronary artery disease   10/22/2016 Surgery   Robotic assisted total hysterectomy, BSO and bilateral pelvic lymphadenectomy  Final pathology revealed a 3cm polyp containing serous carcinoma but with no myometrial invasion, no LVSI and negative nodes.  Stage IA Uterine serous cancer   10/22/2016 Pathology Results   1. Lymph nodes, regional resection, right pelvic - SIX BENIGN LYMPH NODES (0/6). 2. Lymph nodes, regional resection, left pelvic - SEVEN BENIGN LYMPH NODES (0/7). 3. Uterus +/- tubes/ovaries, neoplastic, with right ovary and fallopian tube ENDOMYOMETRIUM - SEROUS CARCINOMA ARISING WITHIN AN ENDOMETRIAL POLYP - NO MYOMETRIAL INVASION IDENTIFIED - ADENOMYOSIS -  LEIOMYOMA (1 CM) - SEE ONCOLOGY TABLE AND COMMENT CERVIX - CARCINOMA FOCALLY INVOLVES ENDOCERVICAL GLANDS - NABOTHIAN CYSTS RIGHT ADNEXA - BENIGN OVARY AND FALLOPIAN TUBE - NO CARCINOMA IDENTIFIED 4. Cul-de-sac biopsy - MESOTHELIAL HYPERPLASIA Microscopic Comment 3. ONCOLOGY TABLE-UTERUS, CARCINOMA OR CARCINOSARCOMA Specimen: Uterus, right fallopian tube and ovary Procedure: Total hysterectomy and right salpingo-oophorectomy Lymph node sampling performed: Bilateral pelvic regional resection Specimen integrity: Intact Maximum tumor size: 3 cm (polyp) Histologic type: Serous carcinoma Grade: High grade Myometrial invasion: Not identified Cervical stromal involvement: No, focal endocervical gland involvement Extent of involvement of other organs: Not identified Lymph - vascular invasion: Not identified Peritoneal washings: N/A Lymph nodes: Examined: 0 Sentinel 13 Non-sentinel 13 Total Lymph nodes with metastasis: 0 Isolated tumor cells (< 0.2 mm): 0 Micrometastasis: (> 0.2 mm and < 2.0 mm): 0 Macrometastasis: (> 2.0 mm): 0 Extracapsular extension: N/A Pelvic lymph nodes: 0 involved of 13 lymph nodes. Para-aortic lymph nodes: No para-aortic nodes submitted TNM code: pT1a, pNX FIGO Stage (based on pathologic findings, needs clinical correlation): IA Comment: Immunohistochemistry for cytokeratin AE1/AE3 is performed on all of the lymph nodes (parts 1 & 2) and no metastatic carcinoma is identified.   07/04/2017 Genetic Testing   The patient had genetic testing due to a personal history of breast and uterine cancer, and a family history of stomach cancer.  The Multi-Cancer Panel was ordered. The Multi-Cancer Panel offered by [facility] includes sequencing and/or deletion duplication testing of the following 83 genes: ALK, APC, ATM, AXIN2,BAP1,  BARD1, BLM, BMPR1A, BRCA1, BRCA2, BRIP1, CASR, CDC73, CDH1, CDK4, CDKN1B, CDKN1C, CDKN2A (p14ARF), CDKN2A (p16INK4a), CEBPA, CHEK2, CTNNA1,  DICER1, DIS3L2, EGFR (c.2369C>T, p.Thr790Met variant only),  EPCAM (Deletion/duplication testing only), FH, FLCN, GATA2, GPC3, GREM1 (Promoter region deletion/duplication testing only), HOXB13 (c.251G>A, p.Gly84Glu), HRAS, KIT, MAX, MEN1, MET, MITF (c.952G>A, p.Glu318Lys variant only), MLH1, MSH2, MSH3, MSH6, MUTYH, NBN, NF1, NF2, NTHL1, PALB2, PDGFRA, PHOX2B, PMS2, POLD1, POLE, POT1, PRKAR1A, PTCH1, PTEN, RAD50, RAD51C, RAD51D, RB1, RECQL4, RET, RUNX1, SDHAF2, SDHA (sequence changes only), SDHB, SDHC, SDHD, SMAD4, SMARCA4, SMARCB1, SMARCE1, STK11, SUFU, TERC, TERT, TMEM127, TP53, TSC1, TSC2, VHL, WRN and WT1.   Results: No pathogenic variants were identified.  A variant of uncertain significance in the gene APC was identified.  c.791A>G (p.Gln264Arg).  The date of this test report is 07/04/2017.     Genetic Testing   Patient has genetic testing done for MSI. 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    12/03/2017 Imaging   CT Chest/Abd/Pelvis to follow pulmonary nodule and gastric mass IMPRESSION: 1. Stable CT of the chest. Small pulmonary nodules are unchanged when compared with previous exam. 2. No new findings identified. 3. Subcentimeter low-attenuation lesions within the liver are remain too small to characterize but are stable from prior exam. 4. Persistent indeterminate low-attenuation structure within the proximal stomach is unchanged measuring 1.4 cm. Correlation with direct visualization is advised   06/30/2018 Imaging   CT CHEST Lungs/Pleura: Stable scattered sub-cm pulmonary nodules are again seen bilaterally and are stable compared to previous studies. No new or enlarging pulmonary nodules or masses identified. No evidence of pulmonary infiltrate or pleural effusion.   08/19/2018 Echocardiogram   [place] done in [place]: EF 60-65%. Mild impaired relaxation. (report scanned)   09/17/2018 Relapse/Recurrence   Presented with c/o vagina discharge.  Lesion  noted at the left vaginal apex 33m lesion removed Path c/w high grade serous cancer   09/17/2018 Pathology Results   Vagina, biopsy, left apex - HIGH GRADE SEROUS CARCINOMA.   09/28/2018 PET scan   1. Two hypermetabolic axial lymph nodes. Unusual site for metastatic endometrial carcinoma however the activity is more intense than typically seen in reactive adenopathy. Suggest ultrasound-guided percutaneous biopsy of the larger RIGHT axial lymph node 2. No evidence of local recurrence at the vaginal cuff.  3. No metastatic adenopathy in the abdomen or pelvis. 4. Stable small pulmonary nodules   10/09/2018 Pathology Results   Lymph node, needle/core biopsy, right axilla - METASTATIC CARCINOMA, SEE COMMENT. Microscopic Comment The carcinoma appears high grade. Immunohistochemistry is positive for cytokeratin 7, PAX-8, and ER. Cytokeratin 20, CDX-2, PR, and GATA-3 are negative. The findings along with the history are consistent with a gynecologic primary.    10/09/2018 Procedure   Ultrasound-guided core biopsies of a right axillary lymph node.   10/14/2018 Cancer Staging   Staging form: Corpus Uteri - Carcinoma and Carcinosarcoma, AJCC 8th Edition - Clinical: Stage IVB (cT1, cN0, pM1) - Signed by GHeath Lark MD on 10/14/2018   10/19/2018 Imaging   Placement of single lumen port a cath via right internal jugular vein. The catheter tip lies at the cavo-atrial junction. A power injectable port a cath was placed and is ready for immediate use.    10/21/2018 -  Chemotherapy   The patient had carboplatin and taxol for treatment   11/11/2018 -  Chemotherapy   The patient had trastuzumab ([name]) 450 mg in sodium chloride 0.9 % 250 mL chemo infusion, 483 mg, Intravenous,  Once, 5 of 7 cycles Administration: 450 mg (11/11/2018), 378 mg (12/02/2018), 378 mg (02/03/2019), 378 mg (12/23/2018), 378 mg (01/13/2019)  for chemotherapy treatment.  12/21/2018 PET scan   1. Interval resolution of hypermetabolic right  axillary lymph nodes. No metabolic findings highly suspicious for recurrent metastatic disease. 2. New mild hypermetabolism within a borderline prominent portacaval lymph node, nonspecific. While a reactive node is favored, a lymph node metastasis cannot be entirely excluded. Suggest attention to this lymph node on follow-up PET-CT in 3-6 months. 3. Nonspecific new hypermetabolism at the ileocecal valve, more likely physiologic given absence of CT correlate. 4. Scattered subcentimeter pulmonary nodules are all stable and below PET resolution, more likely benign, continued CT surveillance advised. 5. Chronic findings include: Aortic Atherosclerosis (ICD10-I70.0). Marked diffuse colonic diverticulosis. Coronary atherosclerosis.    12/28/2018 Echocardiogram   1. The left ventricle has normal systolic function with an ejection fraction of 60-65%. The cavity size was normal. Left ventricular diastolic Doppler parameters are consistent with impaired relaxation.  2. GLS recorded as -10.7 but LV appears hyperdynamic and tracking of endocardium appears poor.  3. The right ventricle has normal systolic function. The cavity was normal. There is no increase in right ventricular wall thickness.  4. Mild thickening of the mitral valve leaflet.  5. The aortic valve was not well visualized. Mild thickening of the aortic valve. Aortic valve regurgitation is trivial by color flow Doppler.   Metastasis to lymph nodes ([place])  10/14/2018 Initial Diagnosis   Metastasis to lymph nodes ([place])   10/21/2018 -  Chemotherapy   The patient had palonosetron (ALOXI) injection 0.25 mg, 0.25 mg, Intravenous,  Once, 6 of 7 cycles Administration: 0.25 mg (10/21/2018), 0.25 mg (11/11/2018), 0.25 mg (12/02/2018), 0.25 mg (12/23/2018), 0.25 mg (01/13/2019), 0.25 mg (02/03/2019) CARBOplatin (PARAPLATIN) 310 mg in sodium chloride 0.9 % 250 mL chemo infusion, 310 mg, Intravenous,  Once, 6 of 7 cycles Dose modification: 300 mg (original dose 311.5  mg, Cycle 4, Reason: Dose not tolerated) Administration: 310 mg (10/21/2018), 300 mg (11/11/2018), 300 mg (12/02/2018), 300 mg (12/23/2018), 300 mg (01/13/2019), 300 mg (02/03/2019) PACLitaxel (TAXOL) 222 mg in sodium chloride 0.9 % 250 mL chemo infusion (> 5m/m2), 135 mg/m2 = 222 mg, Intravenous,  Once, 6 of 7 cycles Administration: 222 mg (10/21/2018), 222 mg (11/11/2018), 222 mg (12/02/2018), 222 mg (12/23/2018), 222 mg (01/13/2019), 222 mg (02/03/2019) fosaprepitant (EMEND) 150 mg, dexamethasone (DECADRON) 12 mg in sodium chloride 0.9 % 145 mL IVPB, , Intravenous,  Once, 6 of 7 cycles Administration:  (10/21/2018),  (11/11/2018),  (12/02/2018),  (12/23/2018),  (01/13/2019),  (02/03/2019)  for chemotherapy treatment.      REVIEW OF SYSTEMS:   Constitutional: Denies fevers, chills or abnormal weight loss Eyes: Denies blurriness of vision Ears, nose, mouth, throat, and face: Denies mucositis or sore throat Respiratory: Denies cough, dyspnea or wheezes Cardiovascular: Denies palpitation, chest discomfort or lower extremity swelling Gastrointestinal:  Denies nausea, heartburn or change in bowel habits Skin: Denies abnormal skin rashes Lymphatics: Denies new lymphadenopathy or easy bruising Neurological:Denies numbness, tingling or new weaknesses Behavioral/Psych: Mood is stable, no new changes  All other systems were reviewed with the patient and are negative.  I have reviewed the past medical history, past surgical history, social history and family history with the patient and they are unchanged from previous note.  ALLERGIES:  has No Known Allergies.  MEDICATIONS:  Current Outpatient Medications  Medication Sig Dispense Refill  . acetaminophen (TYLENOL) 500 MG tablet Take 1,000 mg by mouth every 6 (six) hours as needed.    .Marland Kitchenaspirin EC 81 MG tablet Take 81 mg by mouth daily.    .Marland Kitchenatorvastatin (  LIPITOR) 20 MG tablet Take 20 mg by mouth every evening.     . Calcium Carbonate-Vitamin D (CALCIUM-VITAMIN D)  500-200 MG-UNIT per tablet Take 1 tablet by mouth daily.    . citalopram (CELEXA) 20 MG tablet Take 20 mg by mouth at bedtime.     Marland Kitchen dexamethasone (DECADRON) 4 MG tablet Take 2 tabs at the night before and 2 tabs the morning of chemotherapy, every 3 weeks, by mouth 12 tablet 0  . levothyroxine (SYNTHROID, LEVOTHROID) 100 MCG tablet Take 100 mcg by mouth daily before breakfast.     . lidocaine-prilocaine (EMLA) cream Apply to affected area once 30 g 3  . losartan-hydrochlorothiazide (HYZAAR) 100-25 MG tablet     . metoprolol succinate (TOPROL-XL) 25 MG 24 hr tablet Take 25 mg by mouth daily.    . Multiple Vitamin (MULTIVITAMIN WITH MINERALS) TABS Take 1 tablet by mouth daily.    . ondansetron (ZOFRAN) 8 MG tablet Take 1 tablet (8 mg total) by mouth every 8 (eight) hours as needed. Start on the third day after chemotherapy. 30 tablet 1  . oxyCODONE (OXY IR/ROXICODONE) 5 MG immediate release tablet Take 1 tablet (5 mg total) by mouth every 6 (six) hours as needed for severe pain. 30 tablet 0  . pantoprazole (PROTONIX) 40 MG tablet Take 40 mg by mouth every morning.     . prochlorperazine (COMPAZINE) 10 MG tablet Take 1 tablet (10 mg total) by mouth every 6 (six) hours as needed (Nausea or vomiting). 30 tablet 1  . valACYclovir (VALTREX) 1000 MG tablet valacyclovir 1 gram tablet     No current facility-administered medications for this visit.     PHYSICAL EXAMINATION: ECOG PERFORMANCE STATUS: 1 - Symptomatic but completely ambulatory  Vitals:   02/23/19 0943  BP: (!) 145/77  Pulse: 89  Resp: 18  Temp: 98 F (36.7 C)  SpO2: 98%   Filed Weights   02/23/19 0943  Weight: 134 lb 6.4 oz (61 kg)    GENERAL:alert, no distress and comfortable SKIN: skin color, texture, turgor are normal, no rashes or significant lesions EYES: normal, Conjunctiva are pink and non-injected, sclera clear OROPHARYNX:no exudate, no erythema and lips, buccal mucosa, and tongue normal  NECK: supple, thyroid normal  size, non-tender, without nodularity LYMPH:  no palpable lymphadenopathy in the cervical, axillary or inguinal LUNGS: clear to auscultation and percussion with normal breathing effort HEART: regular rate & rhythm and no murmurs and no lower extremity edema ABDOMEN:abdomen soft, non-tender and normal bowel sounds Musculoskeletal:no cyanosis of digits and no clubbing  NEURO: alert & oriented x 3 with fluent speech, no focal motor/sensory deficits  LABORATORY DATA:  I have reviewed the data as listed    Component Value Date/Time   NA 140 02/23/2019 0912   NA 139 02/13/2017 1512   K 3.3 (L) 02/23/2019 0912   K 4.2 02/13/2017 1512   CL 101 02/23/2019 0912   CO2 27 02/23/2019 0912   CO2 28 02/13/2017 1512   GLUCOSE 118 (H) 02/23/2019 0912   GLUCOSE 118 02/13/2017 1512   BUN 21 02/23/2019 0912   BUN 25.2 02/13/2017 1512   CREATININE 1.20 (H) 02/23/2019 0912   CREATININE 1.2 (H) 02/13/2017 1512   CALCIUM 8.3 (L) 02/23/2019 0912   CALCIUM 10.0 02/13/2017 1512   PROT 7.2 02/23/2019 0912   ALBUMIN 4.2 02/23/2019 0912   AST 24 02/23/2019 0912   ALT 22 02/23/2019 0912   ALKPHOS 71 02/23/2019 0912   BILITOT 0.7 02/23/2019 0912  GFRNONAA 43 (L) 02/23/2019 0912   GFRAA 50 (L) 02/23/2019 0912    No results found for: SPEP, UPEP  Lab Results  Component Value Date   WBC 4.4 02/23/2019   NEUTROABS 2.0 02/23/2019   HGB 9.3 (L) 02/23/2019   HCT 26.5 (L) 02/23/2019   MCV 100.8 (H) 02/23/2019   PLT 125 (L) 02/23/2019      Chemistry      Component Value Date/Time   NA 140 02/23/2019 0912   NA 139 02/13/2017 1512   K 3.3 (L) 02/23/2019 0912   K 4.2 02/13/2017 1512   CL 101 02/23/2019 0912   CO2 27 02/23/2019 0912   CO2 28 02/13/2017 1512   BUN 21 02/23/2019 0912   BUN 25.2 02/13/2017 1512   CREATININE 1.20 (H) 02/23/2019 0912   CREATININE 1.2 (H) 02/13/2017 1512      Component Value Date/Time   CALCIUM 8.3 (L) 02/23/2019 0912   CALCIUM 10.0 02/13/2017 1512   ALKPHOS 71  02/23/2019 0912   AST 24 02/23/2019 0912   ALT 22 02/23/2019 0912   BILITOT 0.7 02/23/2019 0912       All questions were answered. The patient knows to call the clinic with any problems, questions or concerns. No barriers to learning was detected.  I spent 25 minutes counseling the patient face to face. The total time spent in the appointment was 30 minutes and more than 50% was on counseling and review of test results  [name], MD 02/23/2019 5:13 PM

## 2019-02-24 ENCOUNTER — Other Ambulatory Visit: Payer: Self-pay

## 2019-02-24 ENCOUNTER — Inpatient Hospital Stay: Payer: Medicare HMO

## 2019-02-24 VITALS — BP 143/73 | HR 92 | Temp 97.8°F | Resp 18

## 2019-02-24 DIAGNOSIS — Z5112 Encounter for antineoplastic immunotherapy: Secondary | ICD-10-CM | POA: Diagnosis not present

## 2019-02-24 DIAGNOSIS — D61818 Other pancytopenia: Secondary | ICD-10-CM | POA: Diagnosis not present

## 2019-02-24 DIAGNOSIS — Z5111 Encounter for antineoplastic chemotherapy: Secondary | ICD-10-CM | POA: Diagnosis not present

## 2019-02-24 DIAGNOSIS — C541 Malignant neoplasm of endometrium: Secondary | ICD-10-CM | POA: Diagnosis not present

## 2019-02-24 DIAGNOSIS — R918 Other nonspecific abnormal finding of lung field: Secondary | ICD-10-CM | POA: Diagnosis not present

## 2019-02-24 DIAGNOSIS — N183 Chronic kidney disease, stage 3 (moderate): Secondary | ICD-10-CM | POA: Diagnosis not present

## 2019-02-24 DIAGNOSIS — Z170 Estrogen receptor positive status [ER+]: Secondary | ICD-10-CM | POA: Diagnosis not present

## 2019-02-24 DIAGNOSIS — Z79899 Other long term (current) drug therapy: Secondary | ICD-10-CM | POA: Diagnosis not present

## 2019-02-24 DIAGNOSIS — Z7982 Long term (current) use of aspirin: Secondary | ICD-10-CM | POA: Diagnosis not present

## 2019-02-24 DIAGNOSIS — C773 Secondary and unspecified malignant neoplasm of axilla and upper limb lymph nodes: Secondary | ICD-10-CM

## 2019-02-24 DIAGNOSIS — Z853 Personal history of malignant neoplasm of breast: Secondary | ICD-10-CM | POA: Diagnosis not present

## 2019-02-24 DIAGNOSIS — Z7189 Other specified counseling: Secondary | ICD-10-CM

## 2019-02-24 MED ORDER — SODIUM CHLORIDE 0.9 % IV SOLN
Freq: Once | INTRAVENOUS | Status: AC
  Administered 2019-02-24: 09:00:00 via INTRAVENOUS
  Filled 2019-02-24: qty 250

## 2019-02-24 MED ORDER — DIPHENHYDRAMINE HCL 50 MG/ML IJ SOLN
50.0000 mg | Freq: Once | INTRAMUSCULAR | Status: AC
  Administered 2019-02-24: 50 mg via INTRAVENOUS

## 2019-02-24 MED ORDER — PALONOSETRON HCL INJECTION 0.25 MG/5ML
INTRAVENOUS | Status: AC
  Filled 2019-02-24: qty 5

## 2019-02-24 MED ORDER — SODIUM CHLORIDE 0.9 % IV SOLN
300.0000 mg | Freq: Once | INTRAVENOUS | Status: AC
  Administered 2019-02-24: 300 mg via INTRAVENOUS
  Filled 2019-02-24: qty 30

## 2019-02-24 MED ORDER — DIPHENHYDRAMINE HCL 50 MG/ML IJ SOLN
INTRAMUSCULAR | Status: AC
  Filled 2019-02-24: qty 1

## 2019-02-24 MED ORDER — SODIUM CHLORIDE 0.9% FLUSH
10.0000 mL | INTRAVENOUS | Status: DC | PRN
  Administered 2019-02-24: 10 mL
  Filled 2019-02-24: qty 10

## 2019-02-24 MED ORDER — ACETAMINOPHEN 325 MG PO TABS
650.0000 mg | ORAL_TABLET | Freq: Once | ORAL | Status: AC
  Administered 2019-02-24: 650 mg via ORAL

## 2019-02-24 MED ORDER — TRASTUZUMAB CHEMO 150 MG IV SOLR
6.0000 mg/kg | Freq: Once | INTRAVENOUS | Status: AC
  Administered 2019-02-24: 378 mg via INTRAVENOUS
  Filled 2019-02-24: qty 18

## 2019-02-24 MED ORDER — HEPARIN SOD (PORK) LOCK FLUSH 100 UNIT/ML IV SOLN
500.0000 [IU] | Freq: Once | INTRAVENOUS | Status: AC | PRN
  Administered 2019-02-24: 500 [IU]
  Filled 2019-02-24: qty 5

## 2019-02-24 MED ORDER — ACETAMINOPHEN 325 MG PO TABS
ORAL_TABLET | ORAL | Status: AC
  Filled 2019-02-24: qty 2

## 2019-02-24 MED ORDER — FAMOTIDINE IN NACL 20-0.9 MG/50ML-% IV SOLN
INTRAVENOUS | Status: AC
  Filled 2019-02-24: qty 50

## 2019-02-24 MED ORDER — SODIUM CHLORIDE 0.9 % IV SOLN
135.0000 mg/m2 | Freq: Once | INTRAVENOUS | Status: AC
  Administered 2019-02-24: 222 mg via INTRAVENOUS
  Filled 2019-02-24: qty 37

## 2019-02-24 MED ORDER — PALONOSETRON HCL INJECTION 0.25 MG/5ML
0.2500 mg | Freq: Once | INTRAVENOUS | Status: AC
  Administered 2019-02-24: 0.25 mg via INTRAVENOUS

## 2019-02-24 MED ORDER — SODIUM CHLORIDE 0.9 % IV SOLN
Freq: Once | INTRAVENOUS | Status: AC
  Administered 2019-02-24: 10:00:00 via INTRAVENOUS
  Filled 2019-02-24: qty 5

## 2019-02-24 MED ORDER — FAMOTIDINE IN NACL 20-0.9 MG/50ML-% IV SOLN
20.0000 mg | Freq: Once | INTRAVENOUS | Status: AC
  Administered 2019-02-24: 20 mg via INTRAVENOUS

## 2019-02-24 NOTE — Progress Notes (Signed)
Ice applied to hands & feet with initiation of taxol.

## 2019-02-24 NOTE — Patient Instructions (Signed)
[facility] Discharge Instructions for Patients Receiving Chemotherapy  Today you received the following chemotherapy agents:  [name], Taxol, and Carboplatin.  To help prevent nausea and vomiting after your treatment, we encourage you to take your nausea medication as directed.   If you develop nausea and vomiting that is not controlled by your nausea medication, call the clinic.   BELOW ARE SYMPTOMS THAT SHOULD BE REPORTED IMMEDIATELY:  *FEVER GREATER THAN 100.5 F  *CHILLS WITH OR WITHOUT FEVER  NAUSEA AND VOMITING THAT IS NOT CONTROLLED WITH YOUR NAUSEA MEDICATION  *UNUSUAL SHORTNESS OF BREATH  *UNUSUAL BRUISING OR BLEEDING  TENDERNESS IN MOUTH AND THROAT WITH OR WITHOUT PRESENCE OF ULCERS  *URINARY PROBLEMS  *BOWEL PROBLEMS  UNUSUAL RASH Items with * indicate a potential emergency and should be followed up as soon as possible.  Feel free to call the clinic should you have any questions or concerns. The clinic phone number is [phone].  Please show the [facility] [facility] at check-in to the Emergency Department and triage nurse.

## 2019-03-08 NOTE — Progress Notes (Signed)
The following biosimilar Ogivri (trastuzumab-dkst) has been selected for use in this patient.  Demetrius Charity, PharmD, [facility] Pharmac[facility]acy Phone: [phone] 03/08/2019

## 2019-03-23 ENCOUNTER — Ambulatory Visit (HOSPITAL_COMMUNITY)
Admission: RE | Admit: 2019-03-23 | Discharge: 2019-03-23 | Disposition: A | Discharge status: Home / Self Care | Payer: Medicare HMO | Source: Ambulatory Visit | Attending: Hematology and Oncology | Admitting: Hematology and Oncology

## 2019-03-23 ENCOUNTER — Other Ambulatory Visit: Payer: Self-pay

## 2019-03-23 DIAGNOSIS — C541 Malignant neoplasm of endometrium: Secondary | ICD-10-CM | POA: Insufficient documentation

## 2019-03-23 DIAGNOSIS — R918 Other nonspecific abnormal finding of lung field: Secondary | ICD-10-CM | POA: Diagnosis not present

## 2019-03-23 DIAGNOSIS — I2510 Atherosclerotic heart disease of native coronary artery without angina pectoris: Secondary | ICD-10-CM | POA: Insufficient documentation

## 2019-03-23 DIAGNOSIS — I700 Atherosclerosis of aorta: Secondary | ICD-10-CM | POA: Diagnosis not present

## 2019-03-23 LAB — GLUCOSE, CAPILLARY: Glucose-Capillary: 103 mg/dL — ABNORMAL HIGH (ref 70–99)

## 2019-03-23 IMAGING — PT NM PET TUM IMG RESTAG (PS) SKULL BASE T - THIGH
1 series · 4 of 4 positions shown · non-contrast
Comparison: 12/21/2018

CLINICAL DATA: Subsequent treatment strategy for restaging of
endometrial cancer.

EXAM:
NUCLEAR MEDICINE PET SKULL BASE TO THIGH
TECHNIQUE: 6.9 mCi F-18 FDG was injected intravenously. Full-ring PET imaging
was performed from the skull base to thigh after the radiotracer. CT
data was obtained and used for attenuation correction and anatomic
localization.
Fasting blood glucose: 103 mg/dl

[Series 1121: results mm oncology reading · 3.0mm · 1.10mm/px · 4 of 4 slices shown]
[im 1/4]
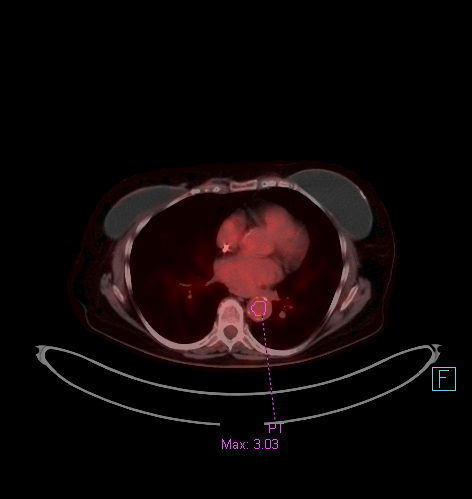
[im 2/4]
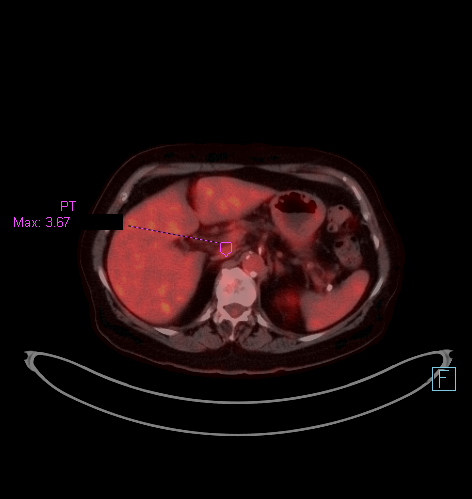
[im 3/4]
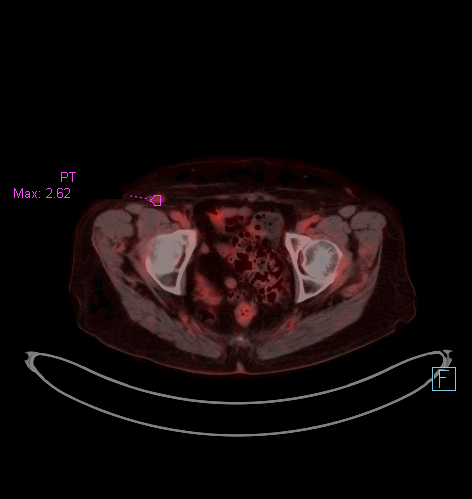
[im 4/4]
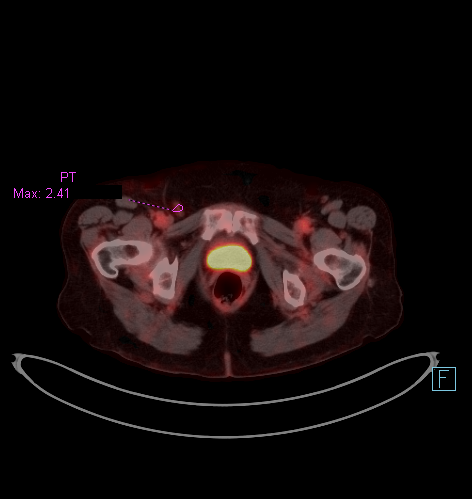

[4 of 4 positions shown; findings below may reference images not displayed]

FINDINGS: Mediastinal blood pool activity: SUV max

Liver activity: SUV max NA

NECK: No areas of abnormal hypermetabolism.

Incidental CT findings: No cervical adenopathy. Left carotid
atherosclerosis.

CHEST: No pulmonary parenchymal or thoracic nodal hypermetabolism.

Incidental CT findings: Right Port-A-Cath tip at high right atrium.
Bilateral breast implants. Left axillary node dissection. Lad
coronary artery atherosclerosis. Scattered small pulmonary nodules
are similar, including in the right middle lobe on 40/8 and 3 mm in
the posterior right upper lobe on [DATE].

ABDOMEN/PELVIS: Portal caval node measures 6 mm and a S.U.V. max of
3.7 on image 94/4. 7 mm and a S.U.V. max of 4.2 on the prior.

Redemonstration of hypermetabolism about the ileocecal valve,
without well-defined mass.

Right inguinal nodes of maximally 7 mm and a S.U.V. max of 2.6 today
versus similar in size and a S.U.V. max of 2.2 on the prior.

Incidental CT findings: Abdominal aortic atherosclerosis. Mild
hepatic steatosis. Normal adrenal glands. Hysterectomy. Scattered
colonic diverticula. Mild pelvic floor laxity.

SKELETON: No abnormal marrow activity.

Incidental CT findings: Remote fracture of the inferior right pubic
ramus. Degenerate changes of the symphysis pubis.
IMPRESSION: 1. No findings of hypermetabolic residual/recurrent or metastatic
disease.
2. Similar low-level hypermetabolism within normal sized portocaval
and right inguinal nodes, favored to be reactive.
3. Ongoing stability of small bilateral pulmonary nodules, favored
to be benign. Below PET resolution.
4. Coronary artery atherosclerosis. Aortic Atherosclerosis
([id number]-BX3.3).

## 2019-03-23 MED ORDER — FLUDEOXYGLUCOSE F - 18 (FDG) INJECTION
6.9000 | Freq: Once | INTRAVENOUS | Status: AC | PRN
  Administered 2019-03-23: 6.9 via INTRAVENOUS

## 2019-03-24 ENCOUNTER — Other Ambulatory Visit: Payer: Self-pay | Admitting: Hematology and Oncology

## 2019-03-24 ENCOUNTER — Inpatient Hospital Stay: Payer: Medicare HMO

## 2019-03-24 ENCOUNTER — Other Ambulatory Visit: Payer: Self-pay

## 2019-03-24 ENCOUNTER — Inpatient Hospital Stay (HOSPITAL_BASED_OUTPATIENT_CLINIC_OR_DEPARTMENT_OTHER): Payer: Medicare HMO | Admitting: Hematology and Oncology

## 2019-03-24 ENCOUNTER — Encounter: Payer: Self-pay | Admitting: Hematology and Oncology

## 2019-03-24 ENCOUNTER — Inpatient Hospital Stay: Payer: Medicare HMO | Attending: Gynecologic Oncology

## 2019-03-24 VITALS — BP 144/61 | HR 77 | Temp 98.0°F | Resp 18 | Ht 62.2 in | Wt 134.4 lb

## 2019-03-24 DIAGNOSIS — C541 Malignant neoplasm of endometrium: Secondary | ICD-10-CM | POA: Insufficient documentation

## 2019-03-24 DIAGNOSIS — Z5111 Encounter for antineoplastic chemotherapy: Secondary | ICD-10-CM

## 2019-03-24 DIAGNOSIS — Z23 Encounter for immunization: Secondary | ICD-10-CM | POA: Insufficient documentation

## 2019-03-24 DIAGNOSIS — C773 Secondary and unspecified malignant neoplasm of axilla and upper limb lymph nodes: Secondary | ICD-10-CM | POA: Diagnosis not present

## 2019-03-24 DIAGNOSIS — R5381 Other malaise: Secondary | ICD-10-CM | POA: Diagnosis not present

## 2019-03-24 DIAGNOSIS — D539 Nutritional anemia, unspecified: Secondary | ICD-10-CM

## 2019-03-24 DIAGNOSIS — D61818 Other pancytopenia: Secondary | ICD-10-CM

## 2019-03-24 DIAGNOSIS — E876 Hypokalemia: Secondary | ICD-10-CM | POA: Diagnosis not present

## 2019-03-24 DIAGNOSIS — Z79899 Other long term (current) drug therapy: Secondary | ICD-10-CM | POA: Diagnosis not present

## 2019-03-24 DIAGNOSIS — Z7982 Long term (current) use of aspirin: Secondary | ICD-10-CM | POA: Insufficient documentation

## 2019-03-24 DIAGNOSIS — R918 Other nonspecific abnormal finding of lung field: Secondary | ICD-10-CM | POA: Insufficient documentation

## 2019-03-24 DIAGNOSIS — R531 Weakness: Secondary | ICD-10-CM | POA: Diagnosis not present

## 2019-03-24 DIAGNOSIS — Z7189 Other specified counseling: Secondary | ICD-10-CM

## 2019-03-24 DIAGNOSIS — Z90710 Acquired absence of both cervix and uterus: Secondary | ICD-10-CM | POA: Diagnosis not present

## 2019-03-24 DIAGNOSIS — Z5112 Encounter for antineoplastic immunotherapy: Secondary | ICD-10-CM | POA: Insufficient documentation

## 2019-03-24 LAB — CBC WITH DIFFERENTIAL (CANCER CENTER ONLY)
Abs Immature Granulocytes: 0.03 10*3/uL (ref 0.00–0.07)
Basophils Absolute: 0 10*3/uL (ref 0.0–0.1)
Basophils Relative: 1 %
Eosinophils Absolute: 0.1 10*3/uL (ref 0.0–0.5)
Eosinophils Relative: 2 %
HCT: 25.8 % — ABNORMAL LOW (ref 36.0–46.0)
Hemoglobin: 9.3 g/dL — ABNORMAL LOW (ref 12.0–15.0)
Immature Granulocytes: 1 %
Lymphocytes Relative: 28 %
Lymphs Abs: 1.2 10*3/uL (ref 0.7–4.0)
MCH: 36.5 pg — ABNORMAL HIGH (ref 26.0–34.0)
MCHC: 36 g/dL (ref 30.0–36.0)
MCV: 101.2 fL — ABNORMAL HIGH (ref 80.0–100.0)
Monocytes Absolute: 0.5 10*3/uL (ref 0.1–1.0)
Monocytes Relative: 12 %
Neutro Abs: 2.5 10*3/uL (ref 1.7–7.7)
Neutrophils Relative %: 56 %
Platelet Count: 123 10*3/uL — ABNORMAL LOW (ref 150–400)
RBC: 2.55 MIL/uL — ABNORMAL LOW (ref 3.87–5.11)
RDW: 12.9 % (ref 11.5–15.5)
WBC Count: 4.4 10*3/uL (ref 4.0–10.5)
nRBC: 0 % (ref 0.0–0.2)

## 2019-03-24 LAB — CMP (CANCER CENTER ONLY)
ALT: 17 U/L (ref 0–44)
AST: 23 U/L (ref 15–41)
Albumin: 4 g/dL (ref 3.5–5.0)
Alkaline Phosphatase: 76 U/L (ref 38–126)
Anion gap: 14 (ref 5–15)
BUN: 19 mg/dL (ref 8–23)
CO2: 24 mmol/L (ref 22–32)
Calcium: 8.3 mg/dL — ABNORMAL LOW (ref 8.9–10.3)
Chloride: 101 mmol/L (ref 98–111)
Creatinine: 1.15 mg/dL — ABNORMAL HIGH (ref 0.44–1.00)
GFR, Est AFR Am: 52 mL/min — ABNORMAL LOW (ref >60)
GFR, Estimated: 45 mL/min — ABNORMAL LOW (ref >60)
Glucose, Bld: 132 mg/dL — ABNORMAL HIGH (ref 70–99)
Potassium: 2.9 mmol/L — CL (ref 3.5–5.1)
Sodium: 139 mmol/L (ref 135–145)
Total Bilirubin: 0.3 mg/dL (ref 0.3–1.2)
Total Protein: 6.7 g/dL (ref 6.5–8.1)

## 2019-03-24 LAB — VITAMIN B12: Vitamin B-12: 686 pg/mL (ref 180–914)

## 2019-03-24 LAB — MAGNESIUM: Magnesium: 0.5 mg/dL — CL (ref 1.7–2.4)

## 2019-03-24 MED ORDER — SODIUM CHLORIDE 0.9 % IV SOLN
Freq: Once | INTRAVENOUS | Status: AC
  Administered 2019-03-24: 10:00:00 via INTRAVENOUS
  Filled 2019-03-24: qty 250

## 2019-03-24 MED ORDER — MAGNESIUM OXIDE 400 (241.3 MG) MG PO TABS: 400.0000 mg | ORAL_TABLET | Freq: Every day | ORAL | 11 refills | Status: DC

## 2019-03-24 MED ORDER — INFLUENZA VAC A&B SA ADJ QUAD 0.5 ML IM PRSY
0.5000 mL | PREFILLED_SYRINGE | Freq: Once | INTRAMUSCULAR | Status: AC
  Administered 2019-03-24: 13:00:00 0.5 mL via INTRAMUSCULAR
  Filled 2019-03-24: qty 0.5

## 2019-03-24 MED ORDER — HEPARIN SOD (PORK) LOCK FLUSH 100 UNIT/ML IV SOLN
500.0000 [IU] | Freq: Once | INTRAVENOUS | Status: AC | PRN
  Administered 2019-03-24: 13:00:00 500 [IU]
  Filled 2019-03-24: qty 5

## 2019-03-24 MED ORDER — SODIUM CHLORIDE 0.9 % IV SOLN
4.0000 g | Freq: Once | INTRAVENOUS | Status: AC
  Administered 2019-03-24: 11:00:00 4 g via INTRAVENOUS
  Filled 2019-03-24: qty 8

## 2019-03-24 MED ORDER — DIPHENHYDRAMINE HCL 25 MG PO CAPS
ORAL_CAPSULE | ORAL | Status: AC
  Filled 2019-03-24: qty 1

## 2019-03-24 MED ORDER — SODIUM CHLORIDE 0.9% FLUSH
10.0000 mL | INTRAVENOUS | Status: DC | PRN
  Administered 2019-03-24: 10 mL
  Filled 2019-03-24: qty 10

## 2019-03-24 MED ORDER — ACETAMINOPHEN 325 MG PO TABS
650.0000 mg | ORAL_TABLET | Freq: Once | ORAL | Status: AC
  Administered 2019-03-24: 650 mg via ORAL

## 2019-03-24 MED ORDER — TRASTUZUMAB-DKST CHEMO 150 MG IV SOLR
6.0000 mg/kg | Freq: Once | INTRAVENOUS | Status: AC
  Administered 2019-03-24: 11:00:00 378 mg via INTRAVENOUS
  Filled 2019-03-24: qty 18

## 2019-03-24 MED ORDER — ACETAMINOPHEN 325 MG PO TABS
ORAL_TABLET | ORAL | Status: AC
  Filled 2019-03-24: qty 2

## 2019-03-24 MED ORDER — DIPHENHYDRAMINE HCL 25 MG PO TABS
25.0000 mg | ORAL_TABLET | Freq: Once | ORAL | Status: AC
  Administered 2019-03-24: 25 mg via ORAL
  Filled 2019-03-24: qty 1

## 2019-03-24 NOTE — Assessment & Plan Note (Signed)
She has significant weakness and debility due to recent chemo I recommend referral to cancer rehab and she agreed

## 2019-03-24 NOTE — Assessment & Plan Note (Signed)
PET CT scan showed no evidence of residual disease We will follow-up with CT imaging in the future Her next imaging study would be end of December I will order echocardiogram next week We will continue [name] maintenance every 3 weeks, indefinitely

## 2019-03-24 NOTE — Assessment & Plan Note (Signed)
She has severe hypomagnesemia and hypokalemia She will receive IV magnesium replacement therapy and oral replacement therapy I recommend potassium rich diet

## 2019-03-24 NOTE — Assessment & Plan Note (Signed)
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

## 2019-03-24 NOTE — Progress Notes (Signed)
[place] OFFICE PROGRESS NOTE  Patient Care Team: Burnard Bunting, [name]  ASSESSMENT & PLAN:  Endometrial cancer [facility]) PET CT scan showed no evidence of residual disease We will follow-up with CT imaging in the future Her next imaging study would be end of December I will order echocardiogram next week We will continue [name] maintenance every 3 weeks, indefinitely  Pancytopenia, acquired ([place]) She has persistent pancytopenia, likely secondary to recent chemotherapy side effects She is not symptomatic Serum vitamin B12 level is adequate  Hypomagnesemia She has severe hypomagnesemia and hypokalemia She will receive IV magnesium replacement therapy and oral replacement therapy I recommend potassium rich diet  Physical debility She has significant weakness and debility due to recent chemo I recommend referral to cancer rehab and she agreed  We discussed the importance of preventive care and reviewed the vaccination programs. She does not have any prior allergic reactions to influenza vaccination. She agrees to proceed with influenza vaccination today and we will administer it today at the clinic.   Orders Placed This Encounter  Procedures  . Vitamin B12    Standing Status:   Future    Number of Occurrences:   1    Standing Expiration Date:   04/27/2020  . Magnesium    Standing Status:   Future    Number of Occurrences:   1    Standing Expiration Date:   03/23/2020  . Magnesium    Standing Status:   Standing    Number of Occurrences:   3    Standing Expiration Date:   03/23/2020  . Ambulatory referral to Physical Therapy    Referral Priority:   Routine    Referral Type:   Physical Medicine    Referral Reason:   Specialty Services Required    Requested Specialty:   Physical Therapy    Number of Visits Requested:   1  . ECHOCARDIOGRAM COMPLETE    Standing Status:   Future    Standing Expiration Date:   06/22/2020     Order Specific Question:   Where should this test be performed    Answer:   [place]    Order Specific Question:   Perflutren DEFINITY (image enhancing agent) should be administered unless hypersensitivity or allergy exist    Answer:   Administer Perflutren    Order Specific Question:   Reason for exam-Echo    Answer:   Chemotherapy evaluation  v87.41 / v58.11    INTERVAL HISTORY: Please see below for problem oriented charting. She returns today to review test results and to continue maintenance treatment She complains of fatigue Denies recent bleeding No recent fever or chills Her appetite is fair Her daughter is also available over the phone to collaborate her history  SUMMARY OF ONCOLOGIC HISTORY: Oncology History Overview Note  Vicki Cervantes  has a remote history of left  breast cancer at age 78 but received BRCA testing approximately 5 years ago which was negative. Her cancer was treated with surgery but no radiation or chemotherapy.   Serous endometrial cancer, MSI stable ER positive, PR neg, Her2/neu 3+      Endometrial cancer ([place])  08/29/2016 Pathology Results   Endometrium, biopsy - HIGH GRADE ENDOMETRIAL CARCINOMA, SEE COMMENT. Microscopic Comment The sections show multiple fragments of adenocarcinoma displaying glandular and papillary patterns associated with high grade cytomorphology characterized by nuclear pleomorphism, prominent nucleoli and brisk mitosis. Immunohistochemical stains show that the tumor cells are positive  for vimentin, p16, p53 with increased Ki-67 expression. Estrogen and progesterone receptor stains show patchy weak positivity. No significant positivity is seen with CEA. The findings are consistent with high grade endometrial carcinoma and the overall morphology and phenotypic features favor serous carcinoma.   08/29/2016 Initial Diagnosis   She presented with postmenopausal bleeding   10/03/2016 Imaging   CT C/A/P 09/2016 IMPRESSION: 1.  Thickening of the endometrial canal up to 19 mm in fundus, presumably corresponding to the patient's reported endometrial carcinoma. 2. Multiple tiny pulmonary nodules scattered throughout the lungs bilaterally measuring 4 mm or less in size. Nodules of this size are typically considered statistically likely benign. In the setting of known primary malignancy, metastatic disease to the lungs is not excluded, but is not strongly favored on today's examination. Attention on followup studies is recommended to ensure the stability or resolution of these nodules. 3. Subcentimeter low-attenuation lesion in the central aspect of segment 8 of the liver is too small to characterize. This is statistically likely a tiny cyst, but warrants attention on follow-up studies to exclude the possibility of a solitary hepatic metastasis. 4. 1.5 x 1.5 x 1.7 cm well-circumscribed lesion in the proximal stomach. This is of uncertain etiology and significance, and could represent a benign gastric polyp, however, further evaluation with nonemergent endoscopy is suggested in the near future for further evaluation. 5. **An incidental finding of potential clinical significance has been found. 1.1 x 1.6 cm thyroid nodule in the inferior aspect of the right lobe of the thyroid gland. Follow-up evaluation with nonemergent thyroid ultrasound is recommended in the near future to better evaluate this finding. This recommendation follows ACR consensuss guidelines: Managing Incidental Thyroid Nodules Detected on Imaging: White Paper of the ACR Incidental Thyroid Findings Committee. J Am Coll Radiol 2015;12(2):143-150.** 6. Aortic atherosclerosis, in addition to left anterior descending coronary artery disease   10/22/2016 Surgery   Robotic assisted total hysterectomy, BSO and bilateral pelvic lymphadenectomy  Final pathology revealed a 3cm polyp containing serous carcinoma but with no myometrial invasion, no LVSI and negative nodes.  Stage  IA Uterine serous cancer   10/22/2016 Pathology Results   1. Lymph nodes, regional resection, right pelvic - SIX BENIGN LYMPH NODES (0/6). 2. Lymph nodes, regional resection, left pelvic - SEVEN BENIGN LYMPH NODES (0/7). 3. Uterus +/- tubes/ovaries, neoplastic, with right ovary and fallopian tube ENDOMYOMETRIUM - SEROUS CARCINOMA ARISING WITHIN AN ENDOMETRIAL POLYP - NO MYOMETRIAL INVASION IDENTIFIED - ADENOMYOSIS - LEIOMYOMA (1 CM) - SEE ONCOLOGY TABLE AND COMMENT CERVIX - CARCINOMA FOCALLY INVOLVES ENDOCERVICAL GLANDS - NABOTHIAN CYSTS RIGHT ADNEXA - BENIGN OVARY AND FALLOPIAN TUBE - NO CARCINOMA IDENTIFIED 4. Cul-de-sac biopsy - MESOTHELIAL HYPERPLASIA Microscopic Comment 3. ONCOLOGY TABLE-UTERUS, CARCINOMA OR CARCINOSARCOMA Specimen: Uterus, right fallopian tube and ovary Procedure: Total hysterectomy and right salpingo-oophorectomy Lymph node sampling performed: Bilateral pelvic regional resection Specimen integrity: Intact Maximum tumor size: 3 cm (polyp) Histologic type: Serous carcinoma Grade: High grade Myometrial invasion: Not identified Cervical stromal involvement: No, focal endocervical gland involvement Extent of involvement of other organs: Not identified Lymph - vascular invasion: Not identified Peritoneal washings: N/A Lymph nodes: Examined: 0 Sentinel 13 Non-sentinel 13 Total Lymph nodes with metastasis: 0 Isolated tumor cells (< 0.2 mm): 0 Micrometastasis: (> 0.2 mm and < 2.0 mm): 0 Macrometastasis: (> 2.0 mm): 0 Extracapsular extension: N/A Pelvic lymph nodes: 0 involved of 13 lymph nodes. Para-aortic lymph nodes: No para-aortic nodes submitted TNM code: pT1a, pNX FIGO Stage (based on pathologic findings, needs clinical correlation):  IA Comment: Immunohistochemistry for cytokeratin AE1/AE3 is performed on all of the lymph nodes (parts 1 & 2) and no metastatic carcinoma is identified.   07/04/2017 Genetic Testing   The patient had genetic testing  due to a personal history of breast and uterine cancer, and a family history of stomach cancer.  The Multi-Cancer Panel was ordered. The Multi-Cancer Panel offered by [facility] includes sequencing and/or deletion duplication testing of the following 83 genes: ALK, APC, ATM, AXIN2,BAP1,  BARD1, BLM, BMPR1A, BRCA1, BRCA2, BRIP1, CASR, CDC73, CDH1, CDK4, CDKN1B, CDKN1C, CDKN2A (p14ARF), CDKN2A (p16INK4a), CEBPA, CHEK2, CTNNA1, DICER1, DIS3L2, EGFR (c.2369C>T, p.Thr790Met variant only), EPCAM (Deletion/duplication testing only), FH, FLCN, GATA2, GPC3, GREM1 (Promoter region deletion/duplication testing only), HOXB13 (c.251G>A, p.Gly84Glu), HRAS, KIT, MAX, MEN1, MET, MITF (c.952G>A, p.Glu318Lys variant only), MLH1, MSH2, MSH3, MSH6, MUTYH, NBN, NF1, NF2, NTHL1, PALB2, PDGFRA, PHOX2B, PMS2, POLD1, POLE, POT1, PRKAR1A, PTCH1, PTEN, RAD50, RAD51C, RAD51D, RB1, RECQL4, RET, RUNX1, SDHAF2, SDHA (sequence changes only), SDHB, SDHC, SDHD, SMAD4, SMARCA4, SMARCB1, SMARCE1, STK11, SUFU, TERC, TERT, TMEM127, TP53, TSC1, TSC2, VHL, WRN and WT1.   Results: No pathogenic variants were identified.  A variant of uncertain significance in the gene APC was identified.  c.791A>G (p.Gln264Arg).  The date of this test report is 07/04/2017.     Genetic Testing   Patient has genetic testing done for MSI. 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    12/03/2017 Imaging   CT Chest/Abd/Pelvis to follow pulmonary nodule and gastric mass IMPRESSION: 1. Stable CT of the chest. Small pulmonary nodules are unchanged when compared with previous exam. 2. No new findings identified. 3. Subcentimeter low-attenuation lesions within the liver are remain too small to characterize but are stable from prior exam. 4. Persistent indeterminate low-attenuation structure within the proximal stomach is unchanged measuring 1.4 cm. Correlation with direct visualization is advised   06/30/2018 Imaging   CT  CHEST Lungs/Pleura: Stable scattered sub-cm pulmonary nodules are again seen bilaterally and are stable compared to previous studies. No new or enlarging pulmonary nodules or masses identified. No evidence of pulmonary infiltrate or pleural effusion.   08/19/2018 Echocardiogram   [place] done in [place]: EF 60-65%. Mild impaired relaxation. (report scanned)   09/17/2018 Relapse/Recurrence   Presented with c/o vagina discharge.  Lesion noted at the left vaginal apex 57m lesion removed Path c/w high grade serous cancer   09/17/2018 Pathology Results   Vagina, biopsy, left apex - HIGH GRADE SEROUS CARCINOMA.   09/28/2018 PET scan   1. Two hypermetabolic axial lymph nodes. Unusual site for metastatic endometrial carcinoma however the activity is more intense than typically seen in reactive adenopathy. Suggest ultrasound-guided percutaneous biopsy of the larger RIGHT axial lymph node 2. No evidence of local recurrence at the vaginal cuff.  3. No metastatic adenopathy in the abdomen or pelvis. 4. Stable small pulmonary nodules   10/09/2018 Pathology Results   Lymph node, needle/core biopsy, right axilla - METASTATIC CARCINOMA, SEE COMMENT. Microscopic Comment The carcinoma appears high grade. Immunohistochemistry is positive for cytokeratin 7, PAX-8, and ER. Cytokeratin 20, CDX-2, PR, and GATA-3 are negative. The findings along with the history are consistent with a gynecologic primary.    10/09/2018 Procedure   Ultrasound-guided core biopsies of a right axillary lymph node.   10/14/2018 Cancer Staging   Staging form: Corpus Uteri - Carcinoma and Carcinosarcoma, AJCC 8th Edition - Clinical: Stage IVB (cT1, cN0, pM1) - Signed by GHeath Lark MD on 10/14/2018   10/19/2018 Imaging  Placement of single lumen port a cath via right internal jugular vein. The catheter tip lies at the cavo-atrial junction. A power injectable port a cath was placed and is ready for immediate use.    10/21/2018 -  Chemotherapy    The patient had carboplatin and taxol for treatment   11/11/2018 -  Chemotherapy   The patient had trastuzumab ([name]) 450 mg in sodium chloride 0.9 % 250 mL chemo infusion, 483 mg, Intravenous,  Once, 6 of 6 cycles Administration: 450 mg (11/11/2018), 378 mg (12/02/2018), 378 mg (02/03/2019), 378 mg (12/23/2018), 378 mg (01/13/2019), 378 mg (02/24/2019) trastuzumab-dkst (OGIVRI) 378 mg in sodium chloride 0.9 % 250 mL chemo infusion, 6 mg/kg = 378 mg (100 % of original dose 6 mg/kg), Intravenous,  Once, 1 of 3 cycles Dose modification: 6 mg/kg (original dose 6 mg/kg, Cycle 7, Reason: Other (see comments)) Administration: 378 mg (03/24/2019)  for chemotherapy treatment.    12/21/2018 PET scan   1. Interval resolution of hypermetabolic right axillary lymph nodes. No metabolic findings highly suspicious for recurrent metastatic disease. 2. New mild hypermetabolism within a borderline prominent portacaval lymph node, nonspecific. While a reactive node is favored, a lymph node metastasis cannot be entirely excluded. Suggest attention to this lymph node on follow-up PET-CT in 3-6 months. 3. Nonspecific new hypermetabolism at the ileocecal valve, more likely physiologic given absence of CT correlate. 4. Scattered subcentimeter pulmonary nodules are all stable and below PET resolution, more likely benign, continued CT surveillance advised. 5. Chronic findings include: Aortic Atherosclerosis (ICD10-I70.0). Marked diffuse colonic diverticulosis. Coronary atherosclerosis.    12/28/2018 Echocardiogram   1. The left ventricle has normal systolic function with an ejection fraction of 60-65%. The cavity size was normal. Left ventricular diastolic Doppler parameters are consistent with impaired relaxation.  2. GLS recorded as -10.7 but LV appears hyperdynamic and tracking of endocardium appears poor.  3. The right ventricle has normal systolic function. The cavity was normal. There is no increase in right ventricular  wall thickness.  4. Mild thickening of the mitral valve leaflet.  5. The aortic valve was not well visualized. Mild thickening of the aortic valve. Aortic valve regurgitation is trivial by color flow Doppler.   03/24/2019 PET scan   1. No findings of hypermetabolic residual/recurrent or metastatic disease. 2. Similar low-level hypermetabolism within normal sized portocaval and right inguinal nodes, favored to be reactive. 3. Ongoing stability of small bilateral pulmonary nodules, favored to be benign. Below PET resolution. 4. Coronary artery atherosclerosis. Aortic Atherosclerosis (ICD10-I70.0).   Metastasis to lymph nodes ([place])  10/14/2018 Initial Diagnosis   Metastasis to lymph nodes ([place])   10/21/2018 -  Chemotherapy   The patient had palonosetron (ALOXI) injection 0.25 mg, 0.25 mg, Intravenous,  Once, 7 of 7 cycles Administration: 0.25 mg (10/21/2018), 0.25 mg (11/11/2018), 0.25 mg (12/02/2018), 0.25 mg (12/23/2018), 0.25 mg (01/13/2019), 0.25 mg (02/03/2019), 0.25 mg (02/24/2019) CARBOplatin (PARAPLATIN) 310 mg in sodium chloride 0.9 % 250 mL chemo infusion, 310 mg, Intravenous,  Once, 7 of 7 cycles Dose modification: 300 mg (original dose 311.5 mg, Cycle 4, Reason: Dose not tolerated) Administration: 310 mg (10/21/2018), 300 mg (11/11/2018), 300 mg (12/02/2018), 300 mg (12/23/2018), 300 mg (01/13/2019), 300 mg (02/03/2019), 300 mg (02/24/2019) PACLitaxel (TAXOL) 222 mg in sodium chloride 0.9 % 250 mL chemo infusion (> 39m/m2), 135 mg/m2 = 222 mg, Intravenous,  Once, 7 of 7 cycles Administration: 222 mg (10/21/2018), 222 mg (11/11/2018), 222 mg (12/02/2018), 222 mg (12/23/2018), 222 mg (01/13/2019), 222 mg (  02/03/2019), 222 mg (02/24/2019) fosaprepitant (EMEND) 150 mg, dexamethasone (DECADRON) 12 mg in sodium chloride 0.9 % 145 mL IVPB, , Intravenous,  Once, 7 of 7 cycles Administration:  (10/21/2018),  (11/11/2018),  (12/02/2018),  (12/23/2018),  (01/13/2019),  (02/03/2019),  (02/24/2019)  for chemotherapy treatment.       REVIEW OF SYSTEMS:   Constitutional: Denies fevers, chills or abnormal weight loss Eyes: Denies blurriness of vision Ears, nose, mouth, throat, and face: Denies mucositis or sore throat Respiratory: Denies cough, dyspnea or wheezes Cardiovascular: Denies palpitation, chest discomfort or lower extremity swelling Gastrointestinal:  Denies nausea, heartburn or change in bowel habits Skin: Denies abnormal skin rashes Lymphatics: Denies new lymphadenopathy or easy bruising Neurological:Denies numbness, tingling or new weaknesses Behavioral/Psych: Mood is stable, no new changes  All other systems were reviewed with the patient and are negative.  I have reviewed the past medical history, past surgical history, social history and family history with the patient and they are unchanged from previous note.  ALLERGIES:  has No Known Allergies.  MEDICATIONS:  Current Outpatient Medications  Medication Sig Dispense Refill  . acetaminophen (TYLENOL) 500 MG tablet Take 1,000 mg by mouth every 6 (six) hours as needed.    Marland Kitchen aspirin EC 81 MG tablet Take 81 mg by mouth daily.    Marland Kitchen atorvastatin (LIPITOR) 20 MG tablet Take 20 mg by mouth every evening.     . Calcium Carbonate-Vitamin D (CALCIUM-VITAMIN D) 500-200 MG-UNIT per tablet Take 1 tablet by mouth daily.    . citalopram (CELEXA) 20 MG tablet Take 20 mg by mouth at bedtime.     Marland Kitchen dexamethasone (DECADRON) 4 MG tablet Take 2 tabs at the night before and 2 tabs the morning of chemotherapy, every 3 weeks, by mouth 12 tablet 0  . levothyroxine (SYNTHROID, LEVOTHROID) 100 MCG tablet Take 100 mcg by mouth daily before breakfast.     . lidocaine-prilocaine (EMLA) cream Apply to affected area once 30 g 3  . losartan-hydrochlorothiazide (HYZAAR) 100-25 MG tablet     . magnesium oxide (MAG-OX) 400 (241.3 Mg) MG tablet Take 1 tablet (400 mg total) by mouth daily. 30 tablet 11  . metoprolol succinate (TOPROL-XL) 25 MG 24 hr tablet Take 25 mg by mouth daily.     . Multiple Vitamin (MULTIVITAMIN WITH MINERALS) TABS Take 1 tablet by mouth daily.    . ondansetron (ZOFRAN) 8 MG tablet Take 1 tablet (8 mg total) by mouth every 8 (eight) hours as needed. Start on the third day after chemotherapy. 30 tablet 1  . oxyCODONE (OXY IR/ROXICODONE) 5 MG immediate release tablet Take 1 tablet (5 mg total) by mouth every 6 (six) hours as needed for severe pain. 30 tablet 0  . pantoprazole (PROTONIX) 40 MG tablet Take 40 mg by mouth every morning.     . prochlorperazine (COMPAZINE) 10 MG tablet Take 1 tablet (10 mg total) by mouth every 6 (six) hours as needed (Nausea or vomiting). 30 tablet 1  . valACYclovir (VALTREX) 1000 MG tablet valacyclovir 1 gram tablet     No current facility-administered medications for this visit.    Facility-Administered Medications Ordered in Other Visits  Medication Dose Route Frequency Provider Last Rate Last Dose  . influenza vaccine adjuvanted (FLUAD) injection 0.5 mL  0.5 mL Intramuscular Once Emelie Newsom, MD      . magnesium sulfate 4 g in sodium chloride 0.9 % 500 mL  4 g Intravenous Once Alvy Bimler, Antwione Picotte, MD 254 mL/hr at 03/24/19 1109 4 g at  03/24/19 1109    PHYSICAL EXAMINATION: ECOG PERFORMANCE STATUS: 1 - Symptomatic but completely ambulatory  Vitals:   03/24/19 0843  BP: (!) 144/61  Pulse: 77  Resp: 18  Temp: 98 F (36.7 C)  SpO2: 98%   Filed Weights   03/24/19 0843  Weight: 134 lb 6.4 oz (61 kg)    GENERAL:alert, no distress and comfortable SKIN: skin color, texture, turgor are normal, no rashes or significant lesions EYES: normal, Conjunctiva are pink and non-injected, sclera clear OROPHARYNX:no exudate, no erythema and lips, buccal mucosa, and tongue normal  NECK: supple, thyroid normal size, non-tender, without nodularity LYMPH:  no palpable lymphadenopathy in the cervical, axillary or inguinal LUNGS: clear to auscultation and percussion with normal breathing effort HEART: regular rate & rhythm and no murmurs  and no lower extremity edema ABDOMEN:abdomen soft, non-tender and normal bowel sounds Musculoskeletal:no cyanosis of digits and no clubbing  NEURO: alert & oriented x 3 with fluent speech, no focal motor/sensory deficits  LABORATORY DATA:  I have reviewed the data as listed    Component Value Date/Time   NA 139 03/24/2019 0815   NA 139 02/13/2017 1512   K 2.9 (LL) 03/24/2019 0815   K 4.2 02/13/2017 1512   CL 101 03/24/2019 0815   CO2 24 03/24/2019 0815   CO2 28 02/13/2017 1512   GLUCOSE 132 (H) 03/24/2019 0815   GLUCOSE 118 02/13/2017 1512   BUN 19 03/24/2019 0815   BUN 25.2 02/13/2017 1512   CREATININE 1.15 (H) 03/24/2019 0815   CREATININE 1.2 (H) 02/13/2017 1512   CALCIUM 8.3 (L) 03/24/2019 0815   CALCIUM 10.0 02/13/2017 1512   PROT 6.7 03/24/2019 0815   ALBUMIN 4.0 03/24/2019 0815   AST 23 03/24/2019 0815   ALT 17 03/24/2019 0815   ALKPHOS 76 03/24/2019 0815   BILITOT 0.3 03/24/2019 0815   GFRNONAA 45 (L) 03/24/2019 0815   GFRAA 52 (L) 03/24/2019 0815    No results found for: SPEP, UPEP  Lab Results  Component Value Date   WBC 4.4 03/24/2019   NEUTROABS 2.5 03/24/2019   HGB 9.3 (L) 03/24/2019   HCT 25.8 (L) 03/24/2019   MCV 101.2 (H) 03/24/2019   PLT 123 (L) 03/24/2019      Chemistry      Component Value Date/Time   NA 139 03/24/2019 0815   NA 139 02/13/2017 1512   K 2.9 (LL) 03/24/2019 0815   K 4.2 02/13/2017 1512   CL 101 03/24/2019 0815   CO2 24 03/24/2019 0815   CO2 28 02/13/2017 1512   BUN 19 03/24/2019 0815   BUN 25.2 02/13/2017 1512   CREATININE 1.15 (H) 03/24/2019 0815   CREATININE 1.2 (H) 02/13/2017 1512      Component Value Date/Time   CALCIUM 8.3 (L) 03/24/2019 0815   CALCIUM 10.0 02/13/2017 1512   ALKPHOS 76 03/24/2019 0815   AST 23 03/24/2019 0815   ALT 17 03/24/2019 0815   BILITOT 0.3 03/24/2019 0815       RADIOGRAPHIC STUDIES: I have reviewed multiple imaging studies with the patient I have personally reviewed the radiological  images as listed and agreed with the findings in the report. Nm Pet Image Restag (ps) Skull Base To Thigh  Result Date: 03/24/2019 CLINICAL DATA:  Subsequent treatment strategy for restaging of endometrial cancer. EXAM: NUCLEAR MEDICINE PET SKULL BASE TO THIGH TECHNIQUE: 6.9 mCi F-18 FDG was injected intravenously. Full-ring PET imaging was performed from the skull base to thigh after the radiotracer. CT data  was obtained and used for attenuation correction and anatomic localization. Fasting blood glucose: 103 mg/dl COMPARISON:  12/21/2018 FINDINGS: Mediastinal blood pool activity: SUV max 3.0 Liver activity: SUV max NA NECK: No areas of abnormal hypermetabolism. Incidental CT findings: No cervical adenopathy. Left carotid atherosclerosis. CHEST: No pulmonary parenchymal or thoracic nodal hypermetabolism. Incidental CT findings: Right Port-A-Cath tip at high right atrium. Bilateral breast implants. Left axillary node dissection. Lad coronary artery atherosclerosis. Scattered small pulmonary nodules are similar, including in the right middle lobe on 40/8 and 3 mm in the posterior right upper lobe on 27/8. ABDOMEN/PELVIS: Portal caval node measures 6 mm and a S.U.V. max of 3.7 on image 94/4. 7 mm and a S.U.V. max of 4.2 on the prior. Redemonstration of hypermetabolism about the ileocecal valve, without well-defined mass. Right inguinal nodes of maximally 7 mm and a S.U.V. max of 2.6 today versus similar in size and a S.U.V. max of 2.2 on the prior. Incidental CT findings: Abdominal aortic atherosclerosis. Mild hepatic steatosis. Normal adrenal glands. Hysterectomy. Scattered colonic diverticula. Mild pelvic floor laxity. SKELETON: No abnormal marrow activity. Incidental CT findings: Remote fracture of the inferior right pubic ramus. Degenerate changes of the symphysis pubis. IMPRESSION: 1. No findings of hypermetabolic residual/recurrent or metastatic disease. 2. Similar low-level hypermetabolism within normal sized  portocaval and right inguinal nodes, favored to be reactive. 3. Ongoing stability of small bilateral pulmonary nodules, favored to be benign. Below PET resolution. 4. Coronary artery atherosclerosis. Aortic Atherosclerosis (ICD10-I70.0). Electronically Signed   By: Abigail Miyamoto M.D.   On: 03/24/2019 09:28    All questions were answered. The patient knows to call the clinic with any problems, questions or concerns. No barriers to learning was detected.  I spent 25 minutes counseling the patient face to face. The total time spent in the appointment was 30 minutes and more than 50% was on counseling and review of test results  [name], MD 03/24/2019 12:34 PM

## 2019-03-24 NOTE — Patient Instructions (Signed)
[facility] Discharge Instructions for Patients Receiving Chemotherapy  Today you received the following chemotherapy agents: trastuzumab Madalyn Rob)  To help prevent nausea and vomiting after your treatment, we encourage you to take your nausea medication as directed.   If you develop nausea and vomiting that is not controlled by your nausea medication, call the clinic.   BELOW ARE SYMPTOMS THAT SHOULD BE REPORTED IMMEDIATELY:  *FEVER GREATER THAN 100.5 F  *CHILLS WITH OR WITHOUT FEVER  NAUSEA AND VOMITING THAT IS NOT CONTROLLED WITH YOUR NAUSEA MEDICATION  *UNUSUAL SHORTNESS OF BREATH  *UNUSUAL BRUISING OR BLEEDING  TENDERNESS IN MOUTH AND THROAT WITH OR WITHOUT PRESENCE OF ULCERS  *URINARY PROBLEMS  *BOWEL PROBLEMS  UNUSUAL RASH Items with * indicate a potential emergency and should be followed up as soon as possible.  Feel free to call the clinic should you have any questions or concerns. The clinic phone number is [phone].  Please show the [place] at check-in to the Emergency Department and triage nurse.  Influenza Virus Vaccine injection (Fluarix) What is this medicine? INFLUENZA VIRUS VACCINE (in floo EN zuh VAHY ruhs vak SEEN) helps to reduce the risk of getting influenza also known as the flu. This medicine may be used for other purposes; ask your health care provider or pharmacist if you have questions. COMMON BRAND NAME(S): Fluarix, Fluzone What should I tell my health care provider before I take this medicine? They need to know if you have any of these conditions: bleeding disorder like hemophilia fever or infection Guillain-Barre syndrome or other neurological problems immune system problems infection with the human immunodeficiency virus (HIV) or AIDS low blood platelet counts multiple sclerosis an unusual or allergic reaction to influenza virus vaccine, eggs, chicken proteins, latex, gentamicin, other medicines, foods, dyes or  preservatives pregnant or trying to get pregnant breast-feeding How should I use this medicine? This vaccine is for injection into a muscle. It is given by a health care professional. A copy of Vaccine Information Statements will be given before each vaccination. Read this sheet carefully each time. The sheet may change frequently. Talk to your pediatrician regarding the use of this medicine in children. Special care may be needed. Overdosage: If you think you have taken too much of this medicine contact a poison control center or emergency room at once. NOTE: This medicine is only for you. Do not share this medicine with others. What if I miss a dose? This does not apply. What may interact with this medicine? chemotherapy or radiation therapy medicines that lower your immune system like etanercept, anakinra, infliximab, and adalimumab medicines that treat or prevent blood clots like warfarin phenytoin steroid medicines like prednisone or cortisone theophylline vaccines This list may not describe all possible interactions. Give your health care provider a list of all the medicines, herbs, non-prescription drugs, or dietary supplements you use. Also tell them if you smoke, drink alcohol, or use illegal drugs. Some items may interact with your medicine. What should I watch for while using this medicine? Report any side effects that do not go away within 3 days to your doctor or health care professional. Call your health care provider if any unusual symptoms occur within 6 weeks of receiving this vaccine. You may still catch the flu, but the illness is not usually as bad. You cannot get the flu from the vaccine. The vaccine will not protect against colds or other illnesses that may cause fever. The vaccine is needed every year. What  side effects may I notice from receiving this medicine? Side effects that you should report to your doctor or health care professional as soon as possible: allergic  reactions like skin rash, itching or hives, swelling of the face, lips, or tongue Side effects that usually do not require medical attention (report to your doctor or health care professional if they continue or are bothersome): fever headache muscle aches and pains pain, tenderness, redness, or swelling at site where injected weak or tired This list may not describe all possible side effects. Call your doctor for medical advice about side effects. You may report side effects to FDA at 1-800-FDA-1088. Where should I keep my medicine? This vaccine is only given in a clinic, pharmacy, doctor's office, or other health care setting and will not be stored at home. NOTE: This sheet is a summary. It may not cover all possible information. If you have questions about this medicine, talk to your doctor, pharmacist, or health care provider.  2020 Elsevier/Gold Standard (2008-01-27 09:30:40)  Magnesium Sulfate injection What is this medicine? MAGNESIUM SULFATE (mag NEE zee um SUL fate) is an electrolyte injection commonly used to treat low magnesium levels in your blood. It is also used to prevent or control seizures in women with preeclampsia or eclampsia. This medicine may be used for other purposes; ask your health care provider or pharmacist if you have questions. What should I tell my health care provider before I take this medicine? They need to know if you have any of these conditions:  heart disease  history of irregular heart beat  kidney disease  an unusual or allergic reaction to magnesium sulfate, medicines, foods, dyes, or preservatives  pregnant or trying to get pregnant  breast-feeding How should I use this medicine? This medicine is for infusion into a vein. It is given by a health care professional in a hospital or clinic setting. Talk to your pediatrician regarding the use of this medicine in children. While this drug may be prescribed for selected conditions, precautions do  apply. Overdosage: If you think you have taken too much of this medicine contact a poison control center or emergency room at once. NOTE: This medicine is only for you. Do not share this medicine with others. What if I miss a dose? This does not apply. What may interact with this medicine? This medicine may interact with the following medications:  certain medicines for anxiety or sleep  certain medicines for seizures like phenobarbital  digoxin  medicines that relax muscles for surgery  narcotic medicines for pain This list may not describe all possible interactions. Give your health care provider a list of all the medicines, herbs, non-prescription drugs, or dietary supplements you use. Also tell them if you smoke, drink alcohol, or use illegal drugs. Some items may interact with your medicine. What should I watch for while using this medicine? Your condition will be monitored carefully while you are receiving this medicine. You may need blood work done while you are receiving this medicine. What side effects may I notice from receiving this medicine? Side effects that you should report to your doctor or health care professional as soon as possible:  allergic reactions like skin rash, itching or hives, swelling of the face, lips, or tongue  facial flushing  muscle weakness  signs and symptoms of low blood pressure like dizziness; feeling faint or lightheaded, falls; unusually weak or tired  signs and symptoms of a dangerous change in heartbeat or heart rhythm like chest  pain; dizziness; fast or irregular heartbeat; palpitations; breathing problems  sweating This list may not describe all possible side effects. Call your doctor for medical advice about side effects. You may report side effects to FDA at 1-800-FDA-1088. Where should I keep my medicine? This drug is given in a hospital or clinic and will not be stored at home. NOTE: This sheet is a summary. It may not cover all  possible information. If you have questions about this medicine, talk to your doctor, pharmacist, or health care provider.  2020 Elsevier/Gold Standard (2016-01-17 12:31:42)

## 2019-03-25 ENCOUNTER — Telehealth: Payer: Self-pay | Admitting: Hematology and Oncology

## 2019-03-25 NOTE — Telephone Encounter (Signed)
I talk with patient regarding schedule  

## 2019-03-26 ENCOUNTER — Telehealth: Payer: Self-pay

## 2019-03-26 ENCOUNTER — Ambulatory Visit (HOSPITAL_COMMUNITY)
Admission: RE | Admit: 2019-03-26 | Discharge: 2019-03-26 | Disposition: A | Discharge status: Home / Self Care | Payer: Medicare HMO | Source: Ambulatory Visit | Attending: Hematology and Oncology | Admitting: Hematology and Oncology

## 2019-03-26 ENCOUNTER — Other Ambulatory Visit: Payer: Self-pay

## 2019-03-26 DIAGNOSIS — E785 Hyperlipidemia, unspecified: Secondary | ICD-10-CM | POA: Insufficient documentation

## 2019-03-26 DIAGNOSIS — D539 Nutritional anemia, unspecified: Secondary | ICD-10-CM | POA: Insufficient documentation

## 2019-03-26 DIAGNOSIS — Z5111 Encounter for antineoplastic chemotherapy: Secondary | ICD-10-CM | POA: Diagnosis not present

## 2019-03-26 DIAGNOSIS — I080 Rheumatic disorders of both mitral and aortic valves: Secondary | ICD-10-CM | POA: Diagnosis not present

## 2019-03-26 DIAGNOSIS — I10 Essential (primary) hypertension: Secondary | ICD-10-CM | POA: Insufficient documentation

## 2019-03-26 DIAGNOSIS — C50919 Malignant neoplasm of unspecified site of unspecified female breast: Secondary | ICD-10-CM | POA: Insufficient documentation

## 2019-03-26 NOTE — Telephone Encounter (Signed)
-----   Message from [name], MD sent at 03/26/2019  1:01 PM EDT ----- Regarding: echo is normal pls let her know

## 2019-03-26 NOTE — Progress Notes (Signed)
  Echocardiogram 2D Echocardiogram has been performed.  Vicki Cervantes 03/26/2019, 11:50 AM

## 2019-03-26 NOTE — Telephone Encounter (Signed)
Called and left below message. Ask her to call the office for questions. ?

## 2019-04-05 ENCOUNTER — Ambulatory Visit: Payer: Medicare HMO | Attending: Hematology and Oncology

## 2019-04-05 ENCOUNTER — Other Ambulatory Visit: Payer: Self-pay

## 2019-04-05 DIAGNOSIS — R2689 Other abnormalities of gait and mobility: Secondary | ICD-10-CM | POA: Diagnosis not present

## 2019-04-05 DIAGNOSIS — R5381 Other malaise: Secondary | ICD-10-CM | POA: Insufficient documentation

## 2019-04-05 DIAGNOSIS — M6281 Muscle weakness (generalized): Secondary | ICD-10-CM | POA: Insufficient documentation

## 2019-04-05 DIAGNOSIS — C541 Malignant neoplasm of endometrium: Secondary | ICD-10-CM | POA: Diagnosis not present

## 2019-04-05 NOTE — Therapy (Signed)
[place], [place], [address] Phone: [phone]   Fax:  [phone]  Physical Therapy Evaluation  Patient Details  Name: Vicki Cervantes MRN: [id number] Date of Birth: Feb 20, 1941 Referring Provider (PT): [name]   Encounter Date: 04/05/2019  PT End of Session - 04/05/19 1227    Visit Number  1    Number of Visits  17    Date for PT Re-Evaluation  06/07/19    PT Start Time  3903    PT Stop Time  1100    PT Time Calculation (min)  45 min    Activity Tolerance  Patient tolerated treatment well    Behavior During Therapy  [facility] for tasks assessed/performed       Past Medical History:  Diagnosis Date  . Aortic atherosclerosis ([place])   . Arthritis   . Blood transfusion without reported diagnosis   . Breast cancer ([place])     LEFT, '82-left breast cancer-surgery only  . Cataract    removed both eyes  . Complication of anesthesia    nausea  . Diverticulosis   . Esophageal stricture   . Family history of stomach cancer   . GERD (gastroesophageal reflux disease)   . Hemorrhoids   . Hiatal hernia   . High triglycerides   . Hypercholesteremia   . Hypertension   . Hypothyroidism   . Osteoarthritis   . PONV (postoperative nausea and vomiting)   . Uterine cancer [facility])     Past Surgical History:  Procedure Laterality Date  . COLONOSCOPY    . EUS N/A 01/07/2013   Procedure: UPPER ENDOSCOPIC ULTRASOUND (EUS) LINEAR;  Surgeon: Milus Banister, MD;  Location: [facility] ENDOSCOPY;  Service: Endoscopy;  Laterality: N/A;  . EUS N/A 01/05/2015   Procedure: UPPER ENDOSCOPIC ULTRASOUND (EUS) LINEAR;  Surgeon: Milus Banister, MD;  Location: [facility] ENDOSCOPY;  Service: Endoscopy;  Laterality: N/A;  . FOOT SURGERY Left 2009-2010   x2. Surgery in 2011  . IR IMAGING GUIDED PORT INSERTION  10/19/2018  . LEFT HEART CATHETERIZATION WITH CORONARY ANGIOGRAM N/A 01/24/2014   Procedure: LEFT HEART CATHETERIZATION WITH CORONARY  ANGIOGRAM;  Surgeon: Peter M Martinique, MD;  Location: [facility] CATH LAB;  Service: Cardiovascular;  Laterality: N/A;  . LUMBAR [place] SURGERY  01-01-13  . MASTECTOMY, RADICAL Left 1982  . OVARY SURGERY  1972-1973  . ROBOTIC ASSISTED TOTAL HYSTERECTOMY WITH BILATERAL SALPINGO OOPHERECTOMY N/A 10/22/2016   Procedure: XI ROBOTIC ASSISTED TOTAL HYSTERECTOMY WITH RIGHT SALPINGO OOPHORECTOMY; INJECTION OF GREEN DYE WITH EXCISION OF BILATERAL PELVIC LYMPH NODES;  Surgeon: Janie Morning, MD;  Location: [facility] ORS;  Service: Gynecology;  Laterality: N/A;  . ROTATOR CUFF REPAIR Left   . SENTINEL NODE BIOPSY N/A 10/22/2016   Procedure: SENTINEL NODE BIOPSY;  Surgeon: Janie Morning, MD;  Location: [facility] ORS;  Service: Gynecology;  Laterality: N/A;  . SIMPLE MASTECTOMY Left 1982  . TOTAL KNEE ARTHROPLASTY Bilateral 2009-2010   x2  . UPPER GASTROINTESTINAL ENDOSCOPY      There were no vitals filed for this visit.   Subjective Assessment - 04/05/19 1025    Subjective  Pt reports that she was noticing that she has a lot of fatigue and her balance has gotten worse over the past few months. Her fatigue has gotten worse since she was taking chemotherapy for her endometrial cancer diagnosis which she finished 3 days ago. Pt has edema in B edema in her LE with the L>R due to 8 screws in  her L ankle.    Pertinent History  Pt has a history of L radical mastectomy when she was 41 and R simple mastectomy. No previous signs of edema in her B arms. Pt had a hysterectomy 2 years ago due to endometrial cancer which returned this year.         [facility] PT Assessment - 04/05/19 0001      Assessment   Medical Diagnosis  Endometrial Cancer    Referring Provider (PT)  ni Gorsuch    Hand Dominance  Left    Next MD Visit  05/2019    Prior Therapy  Pt has had prior phyiscal therapy but not for this issue.       Precautions   Precautions  Other (comment)   Cancer     Balance Screen   Has the patient fallen in the past 6 months  No     Has the patient had a decrease in activity level because of a fear of falling?   Yes    Is the patient reluctant to leave their home because of a fear of falling?   No      Home Environment   Living Environment  Private residence    Living Arrangements  Alone    Available Help at Discharge  Family    Type of [place] Access  Level entry      Prior Function   Level of [place]  Retired    Leisure  I like to walk      Cognition   Overall Cognitive Status  Within Functional Limits for tasks assessed      Observation/Other Assessments   Skin Integrity  Skin warm, dry and intact.       Balance   Balance Assessed  Yes      Standardized Balance Assessment   Standardized Balance Assessment  Berg Balance Test      Berg Balance Test   Sit to Stand  Able to stand  independently using hands  (Pended)     Standing Unsupported  Able to stand safely 2 minutes    Sitting with Back Unsupported but Feet Supported on Floor or Stool  Able to sit safely and securely 2 minutes    Stand to Sit  Controls descent by using hands    Transfers  Able to transfer safely, definite need of hands    Standing Unsupported with Eyes Closed  Able to stand 10 seconds with supervision  (Pended)     Standing Unsupported with Feet Together  Able to place feet together independently and stand for 1 minute with supervision  (Pended)     From Standing, Reach Forward with Outstretched Arm  Reaches forward but needs supervision    From Standing Position, Pick up Object from [place] to pick up shoe safely and easily    From Standing Position, Turn to Look Behind Over each Shoulder  Needs supervision when turning    Turn 360 Degrees  Able to turn 360 degrees safely in 4 seconds or less    Standing Unsupported, Alternately Place Feet on Step/Stool  Able to stand independently and safely and complete 8 steps in 20 seconds  (Pended)     Standing Unsupported, One Foot in Front  Needs  help to step but can hold 15 seconds    Standing on One Leg  Tries to lift leg/unable to hold 3 seconds but remains standing  independently    Total Score  39  (Pended)         LYMPHEDEMA/ONCOLOGY QUESTIONNAIRE - 04/05/19 1038      Treatment   Active Chemotherapy Treatment  No    Past Chemotherapy Treatment  Yes    Date  04/02/19    Current Hormone Treatment  No    Past Hormone Therapy  No      What other symptoms do you have   Are you Having Heaviness or Tightness  No    Are you having Pain  No    Are you having pitting edema  No    Is it Hard or Difficult finding clothes that fit  No    Do you have infections  No    Is there Decreased scar mobility  No      Lymphedema Assessments   Lymphedema Assessments  Upper extremities;Lower extremities      Right Upper Extremity Lymphedema   10 cm Proximal to Olecranon Process  26.2 cm    Olecranon Process  22.3 cm    15 cm Proximal to Ulnar Styloid Process  21 cm    10 cm Proximal to Ulnar Styloid Process  19.5 cm    Just Proximal to Ulnar Styloid Process  14.8 cm    Across Hand at [employer]  17 cm    At [place] of 2nd Digit  7 cm      Left Upper Extremity Lymphedema   10 cm Proximal to Olecranon Process  26.7 cm    Olecranon Process  25 cm    15 cm Proximal to Ulnar Styloid Process  23 cm    10 cm Proximal to Ulnar Styloid Process  20 cm    Just Proximal to Ulnar Styloid Process  15.9 cm    Across Hand at [employer]  17.9 cm    At [place] of 2nd Digit  7 cm      Right Lower Extremity Lymphedema   30 cm Proximal to Floor at Lateral Plantar Foot  34.5 cm    20 cm Proximal to Floor at Lateral Plantar Foot  27._0 cm Proximal to Floor at Lateral Malleoli  20.8 cm    5 cm Proximal to 1st MTP Joint  22.8 cm    Across MTP Joint  21 cm    Around Proximal Great Toe  7.5 cm      Left Lower Extremity Lymphedema   30 cm Proximal to Floor at Lateral Plantar Foot  32.8 cm    20 cm Proximal to Floor at Lateral Plantar Foot   26.5 cm    10 cm Proximal to Floor at Lateral Malleoli  21 cm    5 cm Proximal to 1st MTP Joint  22.4 cm    Across MTP Joint  21.6 cm    Around Proximal Great Toe  8.5 cm             Objective measurements completed on examination: See above findings.                PT Short Term Goals - 04/05/19 1239      PT SHORT TERM GOAL #1   Title  Pt will be able to state precautions and risk factors for lymphedema within 4 weeks in order to decrease risk for lymphedema in the future related to lymphnode removal and surgery.    Baseline  Pt is able to name 2 lymphedema  facts.    Time  4    Period  Weeks    Status  New    Target Date  05/10/19        PT Long Term Goals - 04/05/19 1240      PT LONG TERM GOAL #1   Title  Pt will perform 52/56 on the BERG balance test in order to demonstrate a decrease risk for falls.    Baseline  39/56    Time  8    Period  Weeks    Status  New    Target Date  06/07/19      PT LONG TERM GOAL #2   Title  The patient will maintain single limb stance x 10 seconds bilaterally.    Baseline  Pt is able to hold her RLE up for 5 seconds or less and LLE for 3 seconds.    Time  8    Period  Weeks    Status  New    Target Date  06/07/19      PT LONG TERM GOAL #3   Title  The patient will return demo HEP for core strength, L hip stretching/strengthening and high level balance.    Baseline  Pt is working on HEP at home.    Time  8    Period  Weeks    Status  New    Target Date  06/07/19             Plan - 04/05/19 1227    Clinical Impression Statement  Pt presents to physical therapy following her last session of chemotherapy 3 days ago for diagnosis of serous endometrial cancer, MSI stable, ER positive, PR neg, Her2/neu 3+. This is the second time this cancer has occured. She had a total hysterectomy with oophorectomy in 2018. Pt had a radical mastectomy on the L and a simple mastectomy of the R  over 30 years ago. She reports no  changes in her arm but does have slight swelling in the L compared to the R which is not surprising due to pt history. Pt presents today with impaired balance as demonstrated by 39/56 on the BERG balance and impaired endurance. Pt was previously walking 2 miles but now is ambulating about 10-15 min at a time. Pt will benefit from skilled physical therapy services in order to address the above limitations.    Personal Factors and Comorbidities  Comorbidity 3+    Comorbidities  CKD stage III, hx breast cancer, endometrial cancer    Examination-Activity Limitations  Other;Locomotion Level    Stability/Clinical Decision Making  Stable/Uncomplicated    Clinical Decision Making  Low    Rehab Potential  Good    PT Frequency  2x / week    PT Duration  8 weeks    PT Treatment/Interventions  ADLs/Self Care Home Management;Electrical Stimulation;Gait training;Stair training;Functional mobility training;Therapeutic activities;Neuromuscular re-education;Balance training;Therapeutic exercise;Patient/family education;Manual techniques;Manual lymph drainage;Compression bandaging;Passive range of motion;Taping;Vasopneumatic Device;Joint Manipulations    PT Next Visit Plan  Check vestibular system, provide balance exercises and begin balance programs.    Consulted and Agree with Plan of Care  Patient       Patient will benefit from skilled therapeutic intervention in order to improve the following deficits and impairments:  Abnormal gait, Decreased balance, Decreased endurance, Dizziness, Decreased activity tolerance, Improper body mechanics  Visit Diagnosis: Endometrial cancer ([place])  Other abnormalities of gait and mobility  Physical debility  Muscle weakness (generalized)     Problem List  Patient Active Problem List   Diagnosis Date Noted  . Deficiency anemia 03/24/2019  . Hypokalemia due to inadequate potassium intake 03/24/2019  . Hypomagnesemia 03/24/2019  . Physical debility 03/24/2019  .  Pancytopenia, acquired ([place]) 02/03/2019  . 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 Bone pain 11/10/2018  . Encounter for antineoplastic chemotherapy 10/20/2018  . 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 Goals of care, counseling/discussion 10/14/2018  . CKD (chronic kidney disease), stage III ([place]) 10/14/2018  . Genetic testing 07/14/2017  . History of breast cancer 06/25/2017  . Family history of stomach cancer   . Vaginal mass 02/13/2017  . Thyroid nodule 02/13/2017  . Pulmonary nodules/lesions, multiple 02/13/2017  . Endometrial cancer ([place]) 09/30/2016  . HTN (hypertension) 01/17/2014  . Chest pain at rest 01/17/2014  . Hyperlipidemia 04/03/2011  . OTHER SPECIFIED DISORDER OF STOMACH AND DUODENUM 10/19/2009    Ander Purpura, PT 04/05/2019, 12:43 PM  [place], [place], [address] Phone: [phone]   Fax:  [phone]  Name: DREA JUREWICZ MRN: [id number] Date of Birth: July 29, 1940

## 2019-04-09 ENCOUNTER — Ambulatory Visit: Payer: Medicare HMO

## 2019-04-09 ENCOUNTER — Other Ambulatory Visit: Payer: Self-pay

## 2019-04-09 DIAGNOSIS — C541 Malignant neoplasm of endometrium: Secondary | ICD-10-CM

## 2019-04-09 DIAGNOSIS — R5381 Other malaise: Secondary | ICD-10-CM | POA: Diagnosis not present

## 2019-04-09 DIAGNOSIS — R2689 Other abnormalities of gait and mobility: Secondary | ICD-10-CM | POA: Diagnosis not present

## 2019-04-09 DIAGNOSIS — M6281 Muscle weakness (generalized): Secondary | ICD-10-CM

## 2019-04-09 NOTE — Therapy (Signed)
[place], [place], [address] Phone: [phone]   Fax:  [phone]  Physical Therapy Treatment  Patient Details  Name: Vicki Cervantes MRN: [id number] Date of Birth: Aug 16, 1940 Referring Provider (PT): [name]   Encounter Date: 04/09/2019  PT End of Session - 04/09/19 1106    Visit Number  2    Number of Visits  17    Date for PT Re-Evaluation  05/05/19    PT Start Time  1100    PT Stop Time  1157    PT Time Calculation (min)  57 min    Activity Tolerance  Patient tolerated treatment well    Behavior During Therapy  [facility] for tasks assessed/performed       Past Medical History:  Diagnosis Date  . Aortic atherosclerosis ([place])   . Arthritis   . Blood transfusion without reported diagnosis   . Breast cancer ([place])     LEFT, '82-left breast cancer-surgery only  . Cataract    removed both eyes  . Complication of anesthesia    nausea  . Diverticulosis   . Esophageal stricture   . Family history of stomach cancer   . GERD (gastroesophageal reflux disease)   . Hemorrhoids   . Hiatal hernia   . High triglycerides   . Hypercholesteremia   . Hypertension   . Hypothyroidism   . Osteoarthritis   . PONV (postoperative nausea and vomiting)   . Uterine cancer [facility])     Past Surgical History:  Procedure Laterality Date  . COLONOSCOPY    . EUS N/A 01/07/2013   Procedure: UPPER ENDOSCOPIC ULTRASOUND (EUS) LINEAR;  Surgeon: Milus Banister, MD;  Location: [facility] ENDOSCOPY;  Service: Endoscopy;  Laterality: N/A;  . EUS N/A 01/05/2015   Procedure: UPPER ENDOSCOPIC ULTRASOUND (EUS) LINEAR;  Surgeon: Milus Banister, MD;  Location: [facility] ENDOSCOPY;  Service: Endoscopy;  Laterality: N/A;  . FOOT SURGERY Left 2009-2010   x2. Surgery in 2011  . IR IMAGING GUIDED PORT INSERTION  10/19/2018  . LEFT HEART CATHETERIZATION WITH CORONARY ANGIOGRAM N/A 01/24/2014   Procedure: LEFT HEART CATHETERIZATION WITH CORONARY  ANGIOGRAM;  Surgeon: Peter M Martinique, MD;  Location: [facility] CATH LAB;  Service: Cardiovascular;  Laterality: N/A;  . LUMBAR [place] SURGERY  01-01-13  . MASTECTOMY, RADICAL Left 1982  . OVARY SURGERY  1972-1973  . ROBOTIC ASSISTED TOTAL HYSTERECTOMY WITH BILATERAL SALPINGO OOPHERECTOMY N/A 10/22/2016   Procedure: XI ROBOTIC ASSISTED TOTAL HYSTERECTOMY WITH RIGHT SALPINGO OOPHORECTOMY; INJECTION OF GREEN DYE WITH EXCISION OF BILATERAL PELVIC LYMPH NODES;  Surgeon: Janie Morning, MD;  Location: [facility] ORS;  Service: Gynecology;  Laterality: N/A;  . ROTATOR CUFF REPAIR Left   . SENTINEL NODE BIOPSY N/A 10/22/2016   Procedure: SENTINEL NODE BIOPSY;  Surgeon: Janie Morning, MD;  Location: [facility] ORS;  Service: Gynecology;  Laterality: N/A;  . SIMPLE MASTECTOMY Left 1982  . TOTAL KNEE ARTHROPLASTY Bilateral 2009-2010   x2  . UPPER GASTROINTESTINAL ENDOSCOPY      There were no vitals filed for this visit.  Subjective Assessment - 04/09/19 1103    Subjective  Pt reports that she has felt pretty good after her last session. She denies falls. Pt denies significant changes since her last session. Pt denies vertigo episodes since her last session.    Pertinent History  Pt has a history of L radical mastectomy when she was 41 and R simple mastectomy. No previous signs of edema in her B arms. Pt  had a hysterectomy 2 years ago due to endometrial cancer which returned this year.    Currently in Pain?  No/denies                       [facility] Adult PT Treatment/Exercise - 04/09/19 0001      Balance   Balance Assessed  Yes      Dynamic Standing Balance   Dynamic Standing - Balance Support  Left upper extremity supported    Dynamic Standing - Level of Assistance  5: Stand by assistance    Lateral lean/weight shifting comments:  Hip 3-way with toe tap with unilateral UE support 2x 10 B     Forward lean/weight shifting comments:  Ankle pendulums 10x     Alternating foot traps comments:  Foot taps single leg  working toward alternate on wgt on floor 2x 10 B with intermittent unilateral UE support. VC for slowing down and controlled movements.     Step ups comments:  Step up on foam bad R/L 2x 10 w/Unilateral UE support. Side stepping up onto foam pad 10x B w/1x Ue support then no UE for 9 reps and SBA. Pt educated to take movements slow and to think about foot placement in order to improve balance.     Dynamic Standing - Comments  side stepping 4 laps and tandem 4 laps at plinth table with intermittent UE support. VC for taking time and focusing on stepping for improved balance.       Standardized Balance Assessment   Standardized Balance Assessment  Vestibular Evaluation      Exercises   Exercises  Knee/Hip;Ankle      Knee/Hip Exercises: Standing   Heel Raises  Both;1 set;10 reps    Heel Raises Limitations  VC for glute activation    Other Standing Knee Exercises  Toe raises 2x 10 VC for UE support and slow movement        Visual Fields, Oculomotor Function, and VOR Assessment:  Visual fields Intact   Smooth Pursuits mild nystagmus on the R  Saccades Nystagmus to the R  Convergence WNL  Slow VOR Mild nystagmus on the R  Fast VOR-head thrust Negative   Spontaneous Nystagmus     With fixation Not present  Spontaneous Nystagmus     Frenzels Not performed  Dynamic Visual Acuity Not performed   VOR Cancellation WNL          PT Short Term Goals - 04/05/19 1239      PT SHORT TERM GOAL #1   Title  Pt will be able to state precautions and risk factors for lymphedema within 4 weeks in order to decrease risk for lymphedema in the future related to lymphnode removal and surgery.    Baseline  Pt is able to name 2 lymphedema facts.    Time  4    Period  Weeks    Status  New    Target Date  05/10/19        PT Long Term Goals - 04/05/19 1240      PT LONG TERM GOAL #1   Title  Pt will perform 52/56 on the BERG balance test in order to demonstrate a decrease risk for falls.     Baseline  39/56    Time  8    Period  Weeks    Status  New    Target Date  06/07/19      PT LONG TERM GOAL #2  Title  The patient will maintain single limb stance x 10 seconds bilaterally.    Baseline  Pt is able to hold her RLE up for 5 seconds or less and LLE for 3 seconds.    Time  8    Period  Weeks    Status  New    Target Date  06/07/19      PT LONG TERM GOAL #3   Title  The patient will return demo HEP for core strength, L hip stretching/strengthening and high level balance.    Baseline  Pt is working on HEP at home.    Time  8    Period  Weeks    Status  New    Target Date  06/07/19            Plan - 04/09/19 1119    Clinical Impression Statement  Pt presents to physical therapy this session stating that she has not had any falls or significant episodes of vertigo since her last session. Pt was given balance exercises for home with UE support at the counter. Pt requires SBA for higher level activities especially activities on compliant surfaces due to poor balance. Pt vestibular system was checked this session and was negative for BPPV but does demonstrate difficulty with VOR activities and will benefit from VOR exercises to assist with improving balance. Pt will benefit from continued POC.    Personal Factors and Comorbidities  Comorbidity 3+    Comorbidities  CKD stage III, hx breast cancer, endometrial cancer    Examination-Activity Limitations  Other;Locomotion Level    PT Frequency  2x / week    PT Duration  8 weeks    PT Treatment/Interventions  ADLs/Self Care Home Management;Electrical Stimulation;Gait training;Stair training;Functional mobility training;Therapeutic activities;Neuromuscular re-education;Balance training;Therapeutic exercise;Patient/family education;Manual techniques;Manual lymph drainage;Compression bandaging;Passive range of motion;Taping;Vasopneumatic Device;Joint Manipulations    PT Next Visit Plan  Continue with balance exercises and add VOR  activities. Try foam step over broken up next session and retro gait.    PT Home Exercise Plan  Access Code: [email or web address]    Consulted and Agree with Plan of Care  Patient       Patient will benefit from skilled therapeutic intervention in order to improve the following deficits and impairments:  Abnormal gait, Decreased balance, Decreased endurance, Dizziness, Decreased activity tolerance, Improper body mechanics  Visit Diagnosis: Endometrial cancer ([place])  Other abnormalities of gait and mobility  Physical debility  Muscle weakness (generalized)     Problem List Patient Active Problem List   Diagnosis Date Noted  . Deficiency anemia 03/24/2019  . Hypokalemia due to inadequate potassium intake 03/24/2019  . Hypomagnesemia 03/24/2019  . Physical debility 03/24/2019  . Pancytopenia, acquired ([place]) 02/03/2019  . 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 Bone pain 11/10/2018  . Encounter for antineoplastic chemotherapy 10/20/2018  . 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 Goals of care, counseling/discussion 10/14/2018  . CKD (chronic kidney disease), stage III ([place]) 10/14/2018  . Genetic testing 07/14/2017  . History of breast cancer 06/25/2017  . Family history of stomach cancer   . Vaginal mass 02/13/2017  . Thyroid nodule 02/13/2017  . Pulmonary nodules/lesions, multiple 02/13/2017  . Endometrial cancer ([place]) 09/30/2016  . HTN (hypertension) 01/17/2014  . Chest pain at rest 01/17/2014  . Hyperlipidemia 04/03/2011  . OTHER SPECIFIED DISORDER OF STOMACH AND DUODENUM 10/19/2009    Ander Purpura, PT 04/09/2019, 12:01 PM  [place]  [place], [place], [address] Phone: [phone]   Fax:  [phone]  Name: LAMETRIA BIGALKE MRN: [id number] Date of Birth: [birth year]

## 2019-04-12 DIAGNOSIS — H33103 Unspecified retinoschisis, bilateral: Secondary | ICD-10-CM | POA: Diagnosis not present

## 2019-04-12 DIAGNOSIS — H35372 Puckering of macula, left eye: Secondary | ICD-10-CM | POA: Diagnosis not present

## 2019-04-12 DIAGNOSIS — H59812 Chorioretinal scars after surgery for detachment, left eye: Secondary | ICD-10-CM | POA: Diagnosis not present

## 2019-04-12 DIAGNOSIS — H338 Other retinal detachments: Secondary | ICD-10-CM | POA: Diagnosis not present

## 2019-04-12 DIAGNOSIS — H35363 Drusen (degenerative) of macula, bilateral: Secondary | ICD-10-CM | POA: Diagnosis not present

## 2019-04-12 DIAGNOSIS — H31091 Other chorioretinal scars, right eye: Secondary | ICD-10-CM | POA: Diagnosis not present

## 2019-04-14 ENCOUNTER — Inpatient Hospital Stay: Payer: Medicare HMO

## 2019-04-14 ENCOUNTER — Encounter: Payer: Self-pay | Admitting: Hematology and Oncology

## 2019-04-14 ENCOUNTER — Other Ambulatory Visit: Payer: Self-pay | Admitting: Hematology and Oncology

## 2019-04-14 ENCOUNTER — Inpatient Hospital Stay (HOSPITAL_BASED_OUTPATIENT_CLINIC_OR_DEPARTMENT_OTHER): Payer: Medicare HMO | Admitting: Hematology and Oncology

## 2019-04-14 ENCOUNTER — Other Ambulatory Visit: Payer: Self-pay

## 2019-04-14 DIAGNOSIS — Z5112 Encounter for antineoplastic immunotherapy: Secondary | ICD-10-CM | POA: Diagnosis not present

## 2019-04-14 DIAGNOSIS — C773 Secondary and unspecified malignant neoplasm of axilla and upper limb lymph nodes: Secondary | ICD-10-CM | POA: Diagnosis not present

## 2019-04-14 DIAGNOSIS — Z7189 Other specified counseling: Secondary | ICD-10-CM

## 2019-04-14 DIAGNOSIS — R918 Other nonspecific abnormal finding of lung field: Secondary | ICD-10-CM | POA: Diagnosis not present

## 2019-04-14 DIAGNOSIS — R531 Weakness: Secondary | ICD-10-CM | POA: Diagnosis not present

## 2019-04-14 DIAGNOSIS — Z23 Encounter for immunization: Secondary | ICD-10-CM | POA: Diagnosis not present

## 2019-04-14 DIAGNOSIS — D630 Anemia in neoplastic disease: Secondary | ICD-10-CM

## 2019-04-14 DIAGNOSIS — C541 Malignant neoplasm of endometrium: Secondary | ICD-10-CM

## 2019-04-14 DIAGNOSIS — D61818 Other pancytopenia: Secondary | ICD-10-CM | POA: Diagnosis not present

## 2019-04-14 DIAGNOSIS — E876 Hypokalemia: Secondary | ICD-10-CM | POA: Diagnosis not present

## 2019-04-14 DIAGNOSIS — R5381 Other malaise: Secondary | ICD-10-CM | POA: Diagnosis not present

## 2019-04-14 LAB — CBC WITH DIFFERENTIAL (CANCER CENTER ONLY)
Abs Immature Granulocytes: 0.03 10*3/uL (ref 0.00–0.07)
Basophils Absolute: 0.1 10*3/uL (ref 0.0–0.1)
Basophils Relative: 1 %
Eosinophils Absolute: 0.2 10*3/uL (ref 0.0–0.5)
Eosinophils Relative: 3 %
HCT: 28.9 % — ABNORMAL LOW (ref 36.0–46.0)
Hemoglobin: 10.2 g/dL — ABNORMAL LOW (ref 12.0–15.0)
Immature Granulocytes: 1 %
Lymphocytes Relative: 25 %
Lymphs Abs: 1.6 10*3/uL (ref 0.7–4.0)
MCH: 35.4 pg — ABNORMAL HIGH (ref 26.0–34.0)
MCHC: 35.3 g/dL (ref 30.0–36.0)
MCV: 100.3 fL — ABNORMAL HIGH (ref 80.0–100.0)
Monocytes Absolute: 0.7 10*3/uL (ref 0.1–1.0)
Monocytes Relative: 12 %
Neutro Abs: 3.8 10*3/uL (ref 1.7–7.7)
Neutrophils Relative %: 58 %
Platelet Count: 169 10*3/uL (ref 150–400)
RBC: 2.88 MIL/uL — ABNORMAL LOW (ref 3.87–5.11)
RDW: 11.9 % (ref 11.5–15.5)
WBC Count: 6.3 10*3/uL (ref 4.0–10.5)
nRBC: 0 % (ref 0.0–0.2)

## 2019-04-14 LAB — CMP (CANCER CENTER ONLY)
ALT: 15 U/L (ref 0–44)
AST: 21 U/L (ref 15–41)
Albumin: 4.1 g/dL (ref 3.5–5.0)
Alkaline Phosphatase: 77 U/L (ref 38–126)
Anion gap: 13 (ref 5–15)
BUN: 26 mg/dL — ABNORMAL HIGH (ref 8–23)
CO2: 24 mmol/L (ref 22–32)
Calcium: 9.3 mg/dL (ref 8.9–10.3)
Chloride: 102 mmol/L (ref 98–111)
Creatinine: 1.26 mg/dL — ABNORMAL HIGH (ref 0.44–1.00)
GFR, Est AFR Am: 47 mL/min — ABNORMAL LOW (ref >60)
GFR, Estimated: 40 mL/min — ABNORMAL LOW (ref >60)
Glucose, Bld: 100 mg/dL — ABNORMAL HIGH (ref 70–99)
Potassium: 3.9 mmol/L (ref 3.5–5.1)
Sodium: 139 mmol/L (ref 135–145)
Total Bilirubin: 0.4 mg/dL (ref 0.3–1.2)
Total Protein: 7 g/dL (ref 6.5–8.1)

## 2019-04-14 LAB — MAGNESIUM: Magnesium: 0.8 mg/dL — CL (ref 1.7–2.4)

## 2019-04-14 MED ORDER — TRASTUZUMAB-DKST CHEMO 150 MG IV SOLR
6.0000 mg/kg | Freq: Once | INTRAVENOUS | Status: AC
  Administered 2019-04-14: 378 mg via INTRAVENOUS
  Filled 2019-04-14: qty 18

## 2019-04-14 MED ORDER — SODIUM CHLORIDE 0.9 % IV SOLN: 4.0000 g | Freq: Once | INTRAVENOUS | Status: DC

## 2019-04-14 MED ORDER — SODIUM CHLORIDE 0.9% FLUSH
10.0000 mL | Freq: Once | INTRAVENOUS | Status: AC
  Administered 2019-04-14: 10 mL
  Filled 2019-04-14: qty 10

## 2019-04-14 MED ORDER — ACETAMINOPHEN 325 MG PO TABS
650.0000 mg | ORAL_TABLET | Freq: Once | ORAL | Status: AC
  Administered 2019-04-14: 650 mg via ORAL

## 2019-04-14 MED ORDER — MAGNESIUM SULFATE 4 GM/100ML IV SOLN
4.0000 g | Freq: Once | INTRAVENOUS | Status: AC
  Administered 2019-04-14: 4 g via INTRAVENOUS
  Filled 2019-04-14: qty 100

## 2019-04-14 MED ORDER — ACETAMINOPHEN 325 MG PO TABS
ORAL_TABLET | ORAL | Status: AC
  Filled 2019-04-14: qty 2

## 2019-04-14 MED ORDER — HEPARIN SOD (PORK) LOCK FLUSH 100 UNIT/ML IV SOLN
500.0000 [IU] | Freq: Once | INTRAVENOUS | Status: AC | PRN
  Administered 2019-04-14: 500 [IU]
  Filled 2019-04-14: qty 5

## 2019-04-14 MED ORDER — SODIUM CHLORIDE 0.9% FLUSH
10.0000 mL | INTRAVENOUS | Status: DC | PRN
  Administered 2019-04-14: 10 mL
  Filled 2019-04-14: qty 10

## 2019-04-14 MED ORDER — DIPHENHYDRAMINE HCL 25 MG PO CAPS
ORAL_CAPSULE | ORAL | Status: AC
  Filled 2019-04-14: qty 1

## 2019-04-14 MED ORDER — SODIUM CHLORIDE 0.9 % IV SOLN
Freq: Once | INTRAVENOUS | Status: AC
  Administered 2019-04-14: 10:00:00 via INTRAVENOUS
  Filled 2019-04-14: qty 250

## 2019-04-14 MED ORDER — DIPHENHYDRAMINE HCL 25 MG PO TABS
25.0000 mg | ORAL_TABLET | Freq: Once | ORAL | Status: AC
  Administered 2019-04-14: 25 mg via ORAL
  Filled 2019-04-14: qty 1

## 2019-04-14 NOTE — Progress Notes (Signed)
[name] OFFICE PROGRESS NOTE  Patient Care Team: Burnard Bunting, [name]  ASSESSMENT & PLAN:  Endometrial cancer [facility]) Her recent echocardiogram is within normal limits She have no side effects from recent treatment so far Her blood counts are improving She will continue therapy/rehab for deconditioning We will continue treatment every 3 weeks She will be due for repeat imaging study at the end of the year  Anemia in neoplastic disease Her pancytopenia is resolving Anemia is improving She is not symptomatic Observe only  Hypomagnesemia She has severe hypomagnesemia  She will receive IV magnesium replacement therapy and oral replacement therapy   No orders of the defined types were placed in this encounter.   INTERVAL HISTORY: Please see below for problem oriented charting. She returns for further follow-up She feels well Overall, her stamina is improving Denies recent cough, chest pain no shortness of breath No leg swelling  SUMMARY OF ONCOLOGIC HISTORY: Oncology History Overview Note  Vicki Cervantes  has a remote history of left  breast cancer at age 44 but received BRCA testing approximately 5 years ago which was negative. Her cancer was treated with surgery but no radiation or chemotherapy.   Serous endometrial cancer, MSI stable ER positive, PR neg, Her2/neu 3+      Endometrial cancer ([place])  08/29/2016 Pathology Results   Endometrium, biopsy - HIGH GRADE ENDOMETRIAL CARCINOMA, SEE COMMENT. Microscopic Comment The sections show multiple fragments of adenocarcinoma displaying glandular and papillary patterns associated with high grade cytomorphology characterized by nuclear pleomorphism, prominent nucleoli and brisk mitosis. Immunohistochemical stains show that the tumor cells are positive for vimentin, p16, p53 with increased Ki-67 expression. Estrogen and progesterone receptor stains show patchy weak positivity.  No significant positivity is seen with CEA. The findings are consistent with high grade endometrial carcinoma and the overall morphology and phenotypic features favor serous carcinoma.   08/29/2016 Initial Diagnosis   She presented with postmenopausal bleeding   10/03/2016 Imaging   CT C/A/P 09/2016 IMPRESSION: 1. Thickening of the endometrial canal up to 19 mm in fundus, presumably corresponding to the patient's reported endometrial carcinoma. 2. Multiple tiny pulmonary nodules scattered throughout the lungs bilaterally measuring 4 mm or less in size. Nodules of this size are typically considered statistically likely benign. In the setting of known primary malignancy, metastatic disease to the lungs is not excluded, but is not strongly favored on today's examination. Attention on followup studies is recommended to ensure the stability or resolution of these nodules. 3. Subcentimeter low-attenuation lesion in the central aspect of segment 8 of the liver is too small to characterize. This is statistically likely a tiny cyst, but warrants attention on follow-up studies to exclude the possibility of a solitary hepatic metastasis. 4. 1.5 x 1.5 x 1.7 cm well-circumscribed lesion in the proximal stomach. This is of uncertain etiology and significance, and could represent a benign gastric polyp, however, further evaluation with nonemergent endoscopy is suggested in the near future for further evaluation. 5. **An incidental finding of potential clinical significance has been found. 1.1 x 1.6 cm thyroid nodule in the inferior aspect of the right lobe of the thyroid gland. Follow-up evaluation with nonemergent thyroid ultrasound is recommended in the near future to better evaluate this finding. This recommendation follows ACR consensuss guidelines: Managing Incidental Thyroid Nodules Detected on Imaging: White Paper of the ACR Incidental Thyroid Findings Committee. J Am Coll Radiol 2015;12(2):143-150.** 6. Aortic  atherosclerosis, in addition to left anterior descending  coronary artery disease   10/22/2016 Surgery   Robotic assisted total hysterectomy, BSO and bilateral pelvic lymphadenectomy  Final pathology revealed a 3cm polyp containing serous carcinoma but with no myometrial invasion, no LVSI and negative nodes.  Stage IA Uterine serous cancer   10/22/2016 Pathology Results   1. Lymph nodes, regional resection, right pelvic - SIX BENIGN LYMPH NODES (0/6). 2. Lymph nodes, regional resection, left pelvic - SEVEN BENIGN LYMPH NODES (0/7). 3. Uterus +/- tubes/ovaries, neoplastic, with right ovary and fallopian tube ENDOMYOMETRIUM - SEROUS CARCINOMA ARISING WITHIN AN ENDOMETRIAL POLYP - NO MYOMETRIAL INVASION IDENTIFIED - ADENOMYOSIS - LEIOMYOMA (1 CM) - SEE ONCOLOGY TABLE AND COMMENT CERVIX - CARCINOMA FOCALLY INVOLVES ENDOCERVICAL GLANDS - NABOTHIAN CYSTS RIGHT ADNEXA - BENIGN OVARY AND FALLOPIAN TUBE - NO CARCINOMA IDENTIFIED 4. Cul-de-sac biopsy - MESOTHELIAL HYPERPLASIA Microscopic Comment 3. ONCOLOGY TABLE-UTERUS, CARCINOMA OR CARCINOSARCOMA Specimen: Uterus, right fallopian tube and ovary Procedure: Total hysterectomy and right salpingo-oophorectomy Lymph node sampling performed: Bilateral pelvic regional resection Specimen integrity: Intact Maximum tumor size: 3 cm (polyp) Histologic type: Serous carcinoma Grade: High grade Myometrial invasion: Not identified Cervical stromal involvement: No, focal endocervical gland involvement Extent of involvement of other organs: Not identified Lymph - vascular invasion: Not identified Peritoneal washings: N/A Lymph nodes: Examined: 0 Sentinel 13 Non-sentinel 13 Total Lymph nodes with metastasis: 0 Isolated tumor cells (< 0.2 mm): 0 Micrometastasis: (> 0.2 mm and < 2.0 mm): 0 Macrometastasis: (> 2.0 mm): 0 Extracapsular extension: N/A Pelvic lymph nodes: 0 involved of 13 lymph nodes. Para-aortic lymph nodes: No para-aortic nodes  submitted TNM code: pT1a, pNX FIGO Stage (based on pathologic findings, needs clinical correlation): IA Comment: Immunohistochemistry for cytokeratin AE1/AE3 is performed on all of the lymph nodes (parts 1 & 2) and no metastatic carcinoma is identified.   07/04/2017 Genetic Testing   The patient had genetic testing due to a personal history of breast and uterine cancer, and a family history of stomach cancer.  The Multi-Cancer Panel was ordered. The Multi-Cancer Panel offered by [facility] includes sequencing and/or deletion duplication testing of the following 83 genes: ALK, APC, ATM, AXIN2,BAP1,  BARD1, BLM, BMPR1A, BRCA1, BRCA2, BRIP1, CASR, CDC73, CDH1, CDK4, CDKN1B, CDKN1C, CDKN2A (p14ARF), CDKN2A (p16INK4a), CEBPA, CHEK2, CTNNA1, DICER1, DIS3L2, EGFR (c.2369C>T, p.Thr790Met variant only), EPCAM (Deletion/duplication testing only), FH, FLCN, GATA2, GPC3, GREM1 (Promoter region deletion/duplication testing only), HOXB13 (c.251G>A, p.Gly84Glu), HRAS, KIT, MAX, MEN1, MET, MITF (c.952G>A, p.Glu318Lys variant only), MLH1, MSH2, MSH3, MSH6, MUTYH, NBN, NF1, NF2, NTHL1, PALB2, PDGFRA, PHOX2B, PMS2, POLD1, POLE, POT1, PRKAR1A, PTCH1, PTEN, RAD50, RAD51C, RAD51D, RB1, RECQL4, RET, RUNX1, SDHAF2, SDHA (sequence changes only), SDHB, SDHC, SDHD, SMAD4, SMARCA4, SMARCB1, SMARCE1, STK11, SUFU, TERC, TERT, TMEM127, TP53, TSC1, TSC2, VHL, WRN and WT1.   Results: No pathogenic variants were identified.  A variant of uncertain significance in the gene APC was identified.  c.791A>G (p.Gln264Arg).  The date of this test report is 07/04/2017.     Genetic Testing   Patient has genetic testing done for MSI. 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    12/03/2017 Imaging   CT Chest/Abd/Pelvis to follow pulmonary nodule and gastric mass IMPRESSION: 1. Stable CT of the chest. Small pulmonary nodules are unchanged when compared with previous exam. 2. No new findings identified. 3.  Subcentimeter low-attenuation lesions within the liver are remain too small to characterize but are stable from prior exam. 4. Persistent indeterminate low-attenuation structure within the proximal stomach is unchanged measuring 1.4 cm. Correlation with  direct visualization is advised   06/30/2018 Imaging   CT CHEST Lungs/Pleura: Stable scattered sub-cm pulmonary nodules are again seen bilaterally and are stable compared to previous studies. No new or enlarging pulmonary nodules or masses identified. No evidence of pulmonary infiltrate or pleural effusion.   08/19/2018 Echocardiogram   [place] is done in [place]: EF 60-65%. Mild impaired relaxation. (report scanned)   09/17/2018 Relapse/Recurrence   Presented with c/o vagina discharge.  Lesion noted at the left vaginal apex 70m lesion removed Path c/w high grade serous cancer   09/17/2018 Pathology Results   Vagina, biopsy, left apex - HIGH GRADE SEROUS CARCINOMA.   09/28/2018 PET scan   1. Two hypermetabolic axial lymph nodes. Unusual site for metastatic endometrial carcinoma however the activity is more intense than typically seen in reactive adenopathy. Suggest ultrasound-guided percutaneous biopsy of the larger RIGHT axial lymph node 2. No evidence of local recurrence at the vaginal cuff.  3. No metastatic adenopathy in the abdomen or pelvis. 4. Stable small pulmonary nodules   10/09/2018 Pathology Results   Lymph node, needle/core biopsy, right axilla - METASTATIC CARCINOMA, SEE COMMENT. Microscopic Comment The carcinoma appears high grade. Immunohistochemistry is positive for cytokeratin 7, PAX-8, and ER. Cytokeratin 20, CDX-2, PR, and GATA-3 are negative. The findings along with the history are consistent with a gynecologic primary.    10/09/2018 Procedure   Ultrasound-guided core biopsies of a right axillary lymph node.   10/14/2018 Cancer Staging   Staging form: Corpus Uteri - Carcinoma and Carcinosarcoma, AJCC 8th Edition - Clinical: Stage  IVB (cT1, cN0, pM1) - Signed by GHeath Lark MD on 10/14/2018   10/19/2018 Imaging   Placement of single lumen port a cath via right internal jugular vein. The catheter tip lies at the cavo-atrial junction. A power injectable port a cath was placed and is ready for immediate use.    10/21/2018 -  Chemotherapy   The patient had carboplatin and taxol for treatment   11/11/2018 -  Chemotherapy   The patient had trastuzumab ([name]) 450 mg in sodium chloride 0.9 % 250 mL chemo infusion, 483 mg, Intravenous,  Once, 6 of 6 cycles Administration: 450 mg (11/11/2018), 378 mg (12/02/2018), 378 mg (02/03/2019), 378 mg (12/23/2018), 378 mg (01/13/2019), 378 mg (02/24/2019) trastuzumab-dkst (OGIVRI) 378 mg in sodium chloride 0.9 % 250 mL chemo infusion, 6 mg/kg = 378 mg (100 % of original dose 6 mg/kg), Intravenous,  Once, 2 of 3 cycles Dose modification: 6 mg/kg (original dose 6 mg/kg, Cycle 7, Reason: Other (see comments)) Administration: 378 mg (03/24/2019)  for chemotherapy treatment.    12/21/2018 PET scan   1. Interval resolution of hypermetabolic right axillary lymph nodes. No metabolic findings highly suspicious for recurrent metastatic disease. 2. New mild hypermetabolism within a borderline prominent portacaval lymph node, nonspecific. While a reactive node is favored, a lymph node metastasis cannot be entirely excluded. Suggest attention to this lymph node on follow-up PET-CT in 3-6 months. 3. Nonspecific new hypermetabolism at the ileocecal valve, more likely physiologic given absence of CT correlate. 4. Scattered subcentimeter pulmonary nodules are all stable and below PET resolution, more likely benign, continued CT surveillance advised. 5. Chronic findings include: Aortic Atherosclerosis (ICD10-I70.0). Marked diffuse colonic diverticulosis. Coronary atherosclerosis.    12/28/2018 Echocardiogram   1. The left ventricle has normal systolic function with an ejection fraction of 60-65%. The cavity size was  normal. Left ventricular diastolic Doppler parameters are consistent with impaired relaxation.  2. GLS recorded as -10.7 but LV appears  hyperdynamic and tracking of endocardium appears poor.  3. The right ventricle has normal systolic function. The cavity was normal. There is no increase in right ventricular wall thickness.  4. Mild thickening of the mitral valve leaflet.  5. The aortic valve was not well visualized. Mild thickening of the aortic valve. Aortic valve regurgitation is trivial by color flow Doppler.   03/24/2019 PET scan   1. No findings of hypermetabolic residual/recurrent or metastatic disease. 2. Similar low-level hypermetabolism within normal sized portocaval and right inguinal nodes, favored to be reactive. 3. Ongoing stability of small bilateral pulmonary nodules, favored to be benign. Below PET resolution. 4. Coronary artery atherosclerosis. Aortic Atherosclerosis (ICD10-I70.0).   03/26/2019 Echocardiogram   1. The left ventricle has normal systolic function, with an ejection fraction of 55-60%. The cavity size was normal. Left ventricular diastolic Doppler parameters are consistent with impaired relaxation.  2. The right ventricle has normal systolic function. The cavity was normal.  3. The mitral valve is abnormal. Mild thickening of the mitral valve leaflet. There is mild mitral annular calcification present.  4. The tricuspid valve is grossly normal.  5. The aortic valve is tricuspid. Mild calcification of the aortic valve. No stenosis of the aortic valve.  6. The aorta is normal unless otherwise noted.  7. Normal LV systolic function; grade 1 diastolic dysfunction; ZOX-09.6%.   Metastasis to lymph nodes ([place])  10/14/2018 Initial Diagnosis   Metastasis to lymph nodes ([place])   10/21/2018 - 03/16/2019 Chemotherapy   The patient had palonosetron (ALOXI) injection 0.25 mg, 0.25 mg, Intravenous,  Once, 7 of 7 cycles Administration: 0.25 mg (10/21/2018), 0.25 mg (11/11/2018), 0.25 mg  (12/02/2018), 0.25 mg (12/23/2018), 0.25 mg (01/13/2019), 0.25 mg (02/03/2019), 0.25 mg (02/24/2019) CARBOplatin (PARAPLATIN) 310 mg in sodium chloride 0.9 % 250 mL chemo infusion, 310 mg, Intravenous,  Once, 7 of 7 cycles Dose modification: 300 mg (original dose 311.5 mg, Cycle 4, Reason: Dose not tolerated) Administration: 310 mg (10/21/2018), 300 mg (11/11/2018), 300 mg (12/02/2018), 300 mg (12/23/2018), 300 mg (01/13/2019), 300 mg (02/03/2019), 300 mg (02/24/2019) PACLitaxel (TAXOL) 222 mg in sodium chloride 0.9 % 250 mL chemo infusion (> 16m/m2), 135 mg/m2 = 222 mg, Intravenous,  Once, 7 of 7 cycles Administration: 222 mg (10/21/2018), 222 mg (11/11/2018), 222 mg (12/02/2018), 222 mg (12/23/2018), 222 mg (01/13/2019), 222 mg (02/03/2019), 222 mg (02/24/2019) fosaprepitant (EMEND) 150 mg, dexamethasone (DECADRON) 12 mg in sodium chloride 0.9 % 145 mL IVPB, , Intravenous,  Once, 7 of 7 cycles Administration:  (10/21/2018),  (11/11/2018),  (12/02/2018),  (12/23/2018),  (01/13/2019),  (02/03/2019),  (02/24/2019)  for chemotherapy treatment.      REVIEW OF SYSTEMS:   Constitutional: Denies fevers, chills or abnormal weight loss Eyes: Denies blurriness of vision Ears, nose, mouth, throat, and face: Denies mucositis or sore throat Respiratory: Denies cough, dyspnea or wheezes Cardiovascular: Denies palpitation, chest discomfort or lower extremity swelling Gastrointestinal:  Denies nausea, heartburn or change in bowel habits Skin: Denies abnormal skin rashes Lymphatics: Denies new lymphadenopathy or easy bruising Neurological:Denies numbness, tingling or new weaknesses Behavioral/Psych: Mood is stable, no new changes  All other systems were reviewed with the patient and are negative.  I have reviewed the past medical history, past surgical history, social history and family history with the patient and they are unchanged from previous note.  ALLERGIES:  has No Known Allergies.  MEDICATIONS:  Current Outpatient  Medications  Medication Sig Dispense Refill  . acetaminophen (TYLENOL) 500 MG tablet Take 1,000 mg by  mouth every 6 (six) hours as needed.    Marland Kitchen aspirin EC 81 MG tablet Take 81 mg by mouth daily.    [place] Kitchen atorvastatin (LIPITOR) 20 MG tablet Take 20 mg by mouth every evening.     . Calcium Carbonate-Vitamin D (CALCIUM-VITAMIN D) 500-200 MG-UNIT per tablet Take 1 tablet by mouth daily.    . citalopram (CELEXA) 20 MG tablet Take 20 mg by mouth at bedtime.     Mar[place] Kitchen levothyroxine (SYNTHROID, LEVOTHROID) 100 MCG tablet Take 100 mcg by mouth daily before breakfast.     . lidocaine-prilocaine (EMLA) cream Apply to affected area once 30 g 3  . losartan-hydrochlorothiazide (HYZAAR) 100-25 MG tablet     . magnesium oxide (MAG-OX) 400 (241.3 Mg) MG tablet Take 1 tablet (400 mg total) by mouth daily. 30 tablet 11  . metoprolol succinate (TOPROL-XL) 25 MG 24 hr tablet Take 25 mg by mouth daily.    . Multiple Vitamin (MULTIVITAMIN WITH MINERALS) TABS Take 1 tablet by mouth daily.    . ondansetron (ZOFRAN) 8 MG tablet Take 1 tablet (8 mg total) by mouth every 8 (eight) hours as needed. Start on the third day after chemotherapy. 30 tablet 1  . oxyCODONE (OXY IR/ROXICODONE) 5 MG immediate release tablet Take 1 tablet (5 mg total) by mouth every 6 (six) hours as needed for severe pain. (Patient not taking: Reported on 04/05/2019) 30 tablet 0  . pantoprazole (PROTONIX) 40 MG tablet Take 40 mg by mouth every morning.     . prochlorperazine (COMPAZINE) 10 MG tablet Take 1 tablet (10 mg total) by mouth every 6 (six) hours as needed (Nausea or vomiting). (Patient not taking: Reported on 04/05/2019) 30 tablet 1  . valACYclovir (VALTREX) 1000 MG tablet valacyclovir 1 gram tablet     No current facility-administered medications for this visit.    Facility-Administered Medications Ordered in Other Visits  Medication Dose Route Frequency Provider Last Rate Last Dose  . heparin lock flush 100 unit/mL  500 Units Intracatheter Once  PRN Alvy Bimler, Dinesh Ulysse, MD      . sodium chloride flush (NS) 0.9 % injection 10 mL  10 mL Intracatheter PRN Alvy Bimler, Terri Malerba, MD      . trastuzumab-dkst (OGIVRI) 378 mg in sodium chloride 0.9 % 250 mL chemo infusion  6 mg/kg (Treatment Plan Recorded) Intravenous Once [name], MD        PHYSICAL EXAMINATION: ECOG PERFORMANCE STATUS: 1 - Symptomatic but completely ambulatory  Vitals:   04/14/19 0937  BP: (!) 143/64  Pulse: 78  Resp: 18  Temp: 98 F (36.7 C)  SpO2: 96%   Filed Weights   04/14/19 0937  Weight: 137 lb 3.2 oz (62.2 kg)    GENERAL:alert, no distress and comfortable SKIN: skin color, texture, turgor are normal, no rashes or significant lesions EYES: normal, Conjunctiva are pink and non-injected, sclera clear OROPHARYNX:no exudate, no erythema and lips, buccal mucosa, and tongue normal  NECK: supple, thyroid normal size, non-tender, without nodularity LYMPH:  no palpable lymphadenopathy in the cervical, axillary or inguinal LUNGS: clear to auscultation and percussion with normal breathing effort HEART: regular rate & rhythm and no murmurs and no lower extremity edema ABDOMEN:abdomen soft, non-tender and normal bowel sounds Musculoskeletal:no cyanosis of digits and no clubbing  NEURO: alert & oriented x 3 with fluent speech, no focal motor/sensory deficits  LABORATORY DATA:  I have reviewed the data as listed    Component Value Date/Time   NA 139 04/14/2019 0901   NA 139  02/13/2017 1512   K 3.9 04/14/2019 0901   K 4.2 02/13/2017 1512   CL 102 04/14/2019 0901   CO2 24 04/14/2019 0901   CO2 28 02/13/2017 1512   GLUCOSE 100 (H) 04/14/2019 0901   GLUCOSE 118 02/13/2017 1512   BUN 26 (H) 04/14/2019 0901   BUN 25.2 02/13/2017 1512   CREATININE 1.26 (H) 04/14/2019 0901   CREATININE 1.2 (H) 02/13/2017 1512   CALCIUM 9.3 04/14/2019 0901   CALCIUM 10.0 02/13/2017 1512   PROT 7.0 04/14/2019 0901   ALBUMIN 4.1 04/14/2019 0901   AST 21 04/14/2019 0901   ALT 15 04/14/2019  0901   ALKPHOS 77 04/14/2019 0901   BILITOT 0.4 04/14/2019 0901   GFRNONAA 40 (L) 04/14/2019 0901   GFRAA 47 (L) 04/14/2019 0901    No results found for: SPEP, UPEP  Lab Results  Component Value Date   WBC 6.3 04/14/2019   NEUTROABS 3.8 04/14/2019   HGB 10.2 (L) 04/14/2019   HCT 28.9 (L) 04/14/2019   MCV 100.3 (H) 04/14/2019   PLT 169 04/14/2019      Chemistry      Component Value Date/Time   NA 139 04/14/2019 0901   NA 139 02/13/2017 1512   K 3.9 04/14/2019 0901   K 4.2 02/13/2017 1512   CL 102 04/14/2019 0901   CO2 24 04/14/2019 0901   CO2 28 02/13/2017 1512   BUN 26 (H) 04/14/2019 0901   BUN 25.2 02/13/2017 1512   CREATININE 1.26 (H) 04/14/2019 0901   CREATININE 1.2 (H) 02/13/2017 1512      Component Value Date/Time   CALCIUM 9.3 04/14/2019 0901   CALCIUM 10.0 02/13/2017 1512   ALKPHOS 77 04/14/2019 0901   AST 21 04/14/2019 0901   ALT 15 04/14/2019 0901   BILITOT 0.4 04/14/2019 0901       RADIOGRAPHIC STUDIES: I have personally reviewed the radiological images as listed and agreed with the findings in the report. Nm Pet Image Restag (ps) Skull Base To Thigh  Result Date: 03/24/2019 CLINICAL DATA:  Subsequent treatment strategy for restaging of endometrial cancer. EXAM: NUCLEAR MEDICINE PET SKULL BASE TO THIGH TECHNIQUE: 6.9 mCi F-18 FDG was injected intravenously. Full-ring PET imaging was performed from the skull base to thigh after the radiotracer. CT data was obtained and used for attenuation correction and anatomic localization. Fasting blood glucose: 103 mg/dl COMPARISON:  12/21/2018 FINDINGS: Mediastinal blood pool activity: SUV max 3.0 Liver activity: SUV max NA NECK: No areas of abnormal hypermetabolism. Incidental CT findings: No cervical adenopathy. Left carotid atherosclerosis. CHEST: No pulmonary parenchymal or thoracic nodal hypermetabolism. Incidental CT findings: Right Port-A-Cath tip at high right atrium. Bilateral breast implants. Left axillary node  dissection. Lad coronary artery atherosclerosis. Scattered small pulmonary nodules are similar, including in the right middle lobe on 40/8 and 3 mm in the posterior right upper lobe on 27/8. ABDOMEN/PELVIS: Portal caval node measures 6 mm and a S.U.V. max of 3.7 on image 94/4. 7 mm and a S.U.V. max of 4.2 on the prior. Redemonstration of hypermetabolism about the ileocecal valve, without well-defined mass. Right inguinal nodes of maximally 7 mm and a S.U.V. max of 2.6 today versus similar in size and a S.U.V. max of 2.2 on the prior. Incidental CT findings: Abdominal aortic atherosclerosis. Mild hepatic steatosis. Normal adrenal glands. Hysterectomy. Scattered colonic diverticula. Mild pelvic floor laxity. SKELETON: No abnormal marrow activity. Incidental CT findings: Remote fracture of the inferior right pubic ramus. Degenerate changes of the symphysis pubis. IMPRESSION:  1. No findings of hypermetabolic residual/recurrent or metastatic disease. 2. Similar low-level hypermetabolism within normal sized portocaval and right inguinal nodes, favored to be reactive. 3. Ongoing stability of small bilateral pulmonary nodules, favored to be benign. Below PET resolution. 4. Coronary artery atherosclerosis. Aortic Atherosclerosis (ICD10-I70.0). Electronically Signed   By: Abigail Miyamoto M.D.   On: 03/24/2019 09:28    All questions were answered. The patient knows to call the clinic with any problems, questions or concerns. No barriers to learning was detected.  I spent 15 minutes counseling the patient face to face. The total time spent in the appointment was 20 minutes and more than 50% was on counseling and review of test results  [name], MD 04/14/2019 10:20 AM

## 2019-04-14 NOTE — Assessment & Plan Note (Signed)
Her recent echocardiogram is within normal limits She have no side effects from recent treatment so far Her blood counts are improving She will continue therapy/rehab for deconditioning We will continue treatment every 3 weeks She will be due for repeat imaging study at the end of the year

## 2019-04-14 NOTE — Assessment & Plan Note (Signed)
Her pancytopenia is resolving Anemia is improving She is not symptomatic Observe only

## 2019-04-14 NOTE — Patient Instructions (Addendum)
[facility] Discharge Instructions for Patients Receiving Chemotherapy  Today you received the following chemotherapy agents: trastuzumab.  To help prevent nausea and vomiting after your treatment, we encourage you to take your nausea medication as directed.   If you develop nausea and vomiting that is not controlled by your nausea medication, call the clinic.   BELOW ARE SYMPTOMS THAT SHOULD BE REPORTED IMMEDIATELY:  *FEVER GREATER THAN 100.5 F  *CHILLS WITH OR WITHOUT FEVER  NAUSEA AND VOMITING THAT IS NOT CONTROLLED WITH YOUR NAUSEA MEDICATION  *UNUSUAL SHORTNESS OF BREATH  *UNUSUAL BRUISING OR BLEEDING  TENDERNESS IN MOUTH AND THROAT WITH OR WITHOUT PRESENCE OF ULCERS  *URINARY PROBLEMS  *BOWEL PROBLEMS  UNUSUAL RASH Items with * indicate a potential emergency and should be followed up as soon as possible.  Feel free to call the clinic should you have any questions or concerns. The clinic phone number is [phone].  Please show the [place] at check-in to the Emergency Department and triage nurse.   Hypomagnesemia Hypomagnesemia is a condition in which the level of magnesium in the blood is low. Magnesium is a mineral that is found in many foods. It is used in many different processes in the body. Hypomagnesemia can affect every organ in the body. In severe cases, it can cause life-threatening problems. What are the causes? This condition may be caused by:  Not getting enough magnesium in your diet.  Malnutrition.  Problems with absorbing magnesium from the intestines.  Dehydration.  Alcohol abuse.  Vomiting.  Severe or chronic diarrhea.  Some medicines, including medicines that make you urinate more (diuretics).  Certain diseases, such as kidney disease, diabetes, celiac disease, and overactive thyroid. What are the signs or symptoms? Symptoms of this condition include:  Loss of appetite.  Nausea and  vomiting.  Involuntary shaking or trembling of a body part (tremor).  Muscle weakness.  Tingling in the arms and legs.  Sudden tightening of muscles (muscle spasms).  Confusion.  Psychiatric issues, such as depression, irritability, or psychosis.  A feeling of fluttering of the heart.  Seizures. These symptoms are more severe if magnesium levels drop suddenly. How is this diagnosed? This condition may be diagnosed based on:  Your symptoms and medical history.  A physical exam.  Blood and urine tests. How is this treated? Treatment depends on the cause and the severity of the condition. It may be treated with:  A magnesium supplement. This can be taken in pill form. If the condition is severe, magnesium is usually given through an IV.  Changes to your diet. You may be directed to eat foods that have a lot of magnesium, such as green leafy vegetables, peas, beans, and nuts.  Stopping any intake of alcohol. Follow these instructions at home:      Make sure that your diet includes foods with magnesium. Foods that have a lot of magnesium in them include: ? Green leafy vegetables, such as spinach and broccoli. ? Beans and peas. ? Nuts and seeds, such as almonds and sunflower seeds. ? Whole grains, such as whole grain bread and fortified cereals.  Take magnesium supplements if your health care provider tells you to do that. Take them as directed.  Take over-the-counter and prescription medicines only as told by your health care provider.  Have your magnesium levels monitored as told by your health care provider.  When you are active, drink fluids that contain electrolytes.  Avoid drinking alcohol.  Keep all follow-up  visits as told by your health care provider. This is important. Contact a health care provider if:  You get worse instead of better.  Your symptoms return. Get help right away if you:  Develop severe muscle weakness.  Have trouble  breathing.  Feel that your heart is racing. Summary  Hypomagnesemia is a condition in which the level of magnesium in the blood is low.  Hypomagnesemia can affect every organ in the body.  Treatment may include eating more foods that contain magnesium, taking magnesium supplements, and not drinking alcohol.  Have your magnesium levels monitored as told by your health care provider. This information is not intended to replace advice given to you by your health care provider. Make sure you discuss any questions you have with your health care provider. Document Released: 03/27/2005 Document Revised: 06/13/2017 Document Reviewed: 06/02/2017 Elsevier Patient Education  2020 [employer].

## 2019-04-14 NOTE — Assessment & Plan Note (Signed)
She has severe hypomagnesemia  She will receive IV magnesium replacement therapy and oral replacement therapy

## 2019-04-15 DIAGNOSIS — L570 Actinic keratosis: Secondary | ICD-10-CM | POA: Diagnosis not present

## 2019-04-15 DIAGNOSIS — Z85828 Personal history of other malignant neoplasm of skin: Secondary | ICD-10-CM | POA: Diagnosis not present

## 2019-04-15 DIAGNOSIS — L821 Other seborrheic keratosis: Secondary | ICD-10-CM | POA: Diagnosis not present

## 2019-04-16 ENCOUNTER — Ambulatory Visit: Payer: Medicare HMO | Attending: Hematology and Oncology

## 2019-04-16 ENCOUNTER — Other Ambulatory Visit: Payer: Self-pay

## 2019-04-16 DIAGNOSIS — M6281 Muscle weakness (generalized): Secondary | ICD-10-CM | POA: Diagnosis not present

## 2019-04-16 DIAGNOSIS — R5381 Other malaise: Secondary | ICD-10-CM | POA: Diagnosis not present

## 2019-04-16 DIAGNOSIS — C541 Malignant neoplasm of endometrium: Secondary | ICD-10-CM | POA: Diagnosis not present

## 2019-04-16 DIAGNOSIS — R2689 Other abnormalities of gait and mobility: Secondary | ICD-10-CM | POA: Diagnosis not present

## 2019-04-16 NOTE — Patient Instructions (Signed)
Access Code: [email or web address]  URL: [email or web address]  Date: 04/16/2019  Prepared by: Marmarth with Unilateral Counter Support - 10 reps - 1 sets - 1x daily - 7x weekly Heel Toe Raises with Counter Support - 10 reps - 1 sets - 1x daily - 7x weekly Side Stepping with Counter Support - 4 reps - 1x daily - 7x weekly Tandem Walking with Counter Support - 4 reps - 3 sets - 1x daily - 7x weekly Seated Gaze Stabilization with Head Rotation - 1 reps - 3 sets - 1 minuted timed head rotations resting to your baseline before beginning the next minute hold - 1x daily - 7x weekly Seated Horizontal Saccades - 1 reps - 3 sets - 1 minute then rest and let symptoms completely resolve before beginning the next minute. hold - 1x daily - 7x weekly

## 2019-04-16 NOTE — Therapy (Signed)
[place], [place], [address] Phone: [phone]   Fax:  [phone]  Physical Therapy Treatment  Patient Details  Name: Vicki Cervantes MRN: [id number] Date of Birth: 1941/06/09 Referring Provider (PT): [name]   Encounter Date: 04/16/2019  PT End of Session - 04/16/19 1007    Visit Number  3    Number of Visits  17    Date for PT Re-Evaluation  05/05/19    PT Start Time  1000    PT Stop Time  1100    PT Time Calculation (min)  60 min    Activity Tolerance  Patient tolerated treatment well    Behavior During Therapy  [facility] for tasks assessed/performed       Past Medical History:  Diagnosis Date  . Aortic atherosclerosis ([place])   . Arthritis   . Blood transfusion without reported diagnosis   . Breast cancer ([place])     LEFT, '82-left breast cancer-surgery only  . Cataract    removed both eyes  . Complication of anesthesia    nausea  . Diverticulosis   . Esophageal stricture   . Family history of stomach cancer   . GERD (gastroesophageal reflux disease)   . Hemorrhoids   . Hiatal hernia   . High triglycerides   . Hypercholesteremia   . Hypertension   . Hypothyroidism   . Osteoarthritis   . PONV (postoperative nausea and vomiting)   . Uterine cancer [facility])     Past Surgical History:  Procedure Laterality Date  . COLONOSCOPY    . EUS N/A 01/07/2013   Procedure: UPPER ENDOSCOPIC ULTRASOUND (EUS) LINEAR;  Surgeon: Milus Banister, MD;  Location: [facility] ENDOSCOPY;  Service: Endoscopy;  Laterality: N/A;  . EUS N/A 01/05/2015   Procedure: UPPER ENDOSCOPIC ULTRASOUND (EUS) LINEAR;  Surgeon: Milus Banister, MD;  Location: [facility] ENDOSCOPY;  Service: Endoscopy;  Laterality: N/A;  . FOOT SURGERY Left 2009-2010   x2. Surgery in 2011  . IR IMAGING GUIDED PORT INSERTION  10/19/2018  . LEFT HEART CATHETERIZATION WITH CORONARY ANGIOGRAM N/A 01/24/2014   Procedure: LEFT HEART CATHETERIZATION WITH CORONARY  ANGIOGRAM;  Surgeon: Peter M Martinique, MD;  Location: [facility] CATH LAB;  Service: Cardiovascular;  Laterality: N/A;  . LUMBAR [place] SURGERY  01-01-13  . MASTECTOMY, RADICAL Left 1982  . OVARY SURGERY  1972-1973  . ROBOTIC ASSISTED TOTAL HYSTERECTOMY WITH BILATERAL SALPINGO OOPHERECTOMY N/A 10/22/2016   Procedure: XI ROBOTIC ASSISTED TOTAL HYSTERECTOMY WITH RIGHT SALPINGO OOPHORECTOMY; INJECTION OF GREEN DYE WITH EXCISION OF BILATERAL PELVIC LYMPH NODES;  Surgeon: Janie Morning, MD;  Location: [facility] ORS;  Service: Gynecology;  Laterality: N/A;  . ROTATOR CUFF REPAIR Left   . SENTINEL NODE BIOPSY N/A 10/22/2016   Procedure: SENTINEL NODE BIOPSY;  Surgeon: Janie Morning, MD;  Location: [facility] ORS;  Service: Gynecology;  Laterality: N/A;  . SIMPLE MASTECTOMY Left 1982  . TOTAL KNEE ARTHROPLASTY Bilateral 2009-2010   x2  . UPPER GASTROINTESTINAL ENDOSCOPY      There were no vitals filed for this visit.  Subjective Assessment - 04/16/19 1005    Subjective  Pt states that she is confused by the pendulum exercise. She has been doing her exercises at home. She states that she has been very social this week. Pt denies falls and LOB since her last session.    Pertinent History  Pt has a history of L radical mastectomy when she was 41 and R simple mastectomy. No previous signs of  edema in her B arms. Pt had a hysterectomy 2 years ago due to endometrial cancer which returned this year.    Currently in Pain?  No/denies                       [facility] Adult PT Treatment/Exercise - 04/16/19 0001      Dynamic Standing Balance   Forward lean/weight shifting comments:  Ankle pendulums 10x  w/VC for correct form including slight bend at the knees in order to decrease hamstring stretch.     Step ups comments:  foam step overs using counter support for first 2 then CGA for remaining on BLE performed 8x. Pt is limited by muscle fatigue in the quad. She requires tactile input for improved performance due to poor  proprioception at this time.     Dynamic Standing - Comments  retrogait for 5 laps in the room first lap with counter support. Pt requires VC intermittent to prevent scissoring and SBA/CGA to prevent LOB      Neuro Re-ed    Neuro Re-ed Details   VOR 3x 1 min VC to slow if she has significant increase in symptoms and speed up if no replication of symptoms in order to adequately stimulate the VOR.       Knee/Hip Exercises: Supine   Bridges  Both;Strengthening;15 reps    Bridges Limitations  VC for correct LE alignment and glute squeeze on ascension    Straight Leg Raises  Strengthening;10 reps    Straight Leg Raises Limitations  VC for correct form      Knee/Hip Exercises: Sidelying   Hip ABduction  Strengthening;10 reps;Both    Hip ABduction Limitations  Pt requires tactile cueing and VC in order to prevent compensation due to weakness w/posterior pelvic rotation and LE hip flexion during hip abd.       Ankle Exercises: Standing   Toe Raise  10 reps;2 seconds;Other (comment)   VC for the correct form to decrease compensation w/hip sway            PT Education - 04/16/19 1032    Education Details  Pt educated to add VOR to current HEP at this time. Educated to not start next set with VOR unless symptoms have completely resolved in order to not overstimulate her VOR.    Person(s) Educated  Patient    Methods  Explanation;Demonstration;Verbal cues;Handout    Comprehension  Verbalized understanding;Returned demonstration       PT Short Term Goals - 04/05/19 1239      PT SHORT TERM GOAL #1   Title  Pt will be able to state precautions and risk factors for lymphedema within 4 weeks in order to decrease risk for lymphedema in the future related to lymphnode removal and surgery.    Baseline  Pt is able to name 2 lymphedema facts.    Time  4    Period  Weeks    Status  New    Target Date  05/10/19        PT Long Term Goals - 04/05/19 1240      PT LONG TERM GOAL #1   Title   Pt will perform 52/56 on the BERG balance test in order to demonstrate a decrease risk for falls.    Baseline  39/56    Time  8    Period  Weeks    Status  New    Target Date  06/07/19  PT LONG TERM GOAL #2   Title  The patient will maintain single limb stance x 10 seconds bilaterally.    Baseline  Pt is able to hold her RLE up for 5 seconds or less and LLE for 3 seconds.    Time  8    Period  Weeks    Status  New    Target Date  06/07/19      PT LONG TERM GOAL #3   Title  The patient will return demo HEP for core strength, L hip stretching/strengthening and high level balance.    Baseline  Pt is working on HEP at home.    Time  8    Period  Weeks    Status  New    Target Date  06/07/19            Plan - 04/16/19 1007    Clinical Impression Statement  Pt presents to physical therapy today reporting that she has been performing her exercises at home. She reports difficulty with performing ankle pendulums and was reviewed with good results this session including explanation of how balance strategies and muscles involved. Pt was able to tolerate increase in difficulty with pelvic stablization ther-ex this session with significant weakness noted in her R hip. Balance exercises were increased w/foam step over demonstrating good proprioceptive response with improvement throughout repetitions and less need for CGA. Pt performed VOR and saccade activities this session with slight increase in dizzy symptoms that went down after a short period of time over 3 consecutive repetitions. VOR activities were added to HEP. Pt will benefit from continued POC.    Personal Factors and Comorbidities  Comorbidity 3+    Comorbidities  CKD stage III, hx breast cancer, endometrial cancer    Examination-Activity Limitations  Other;Locomotion Level    PT Frequency  2x / week    PT Duration  8 weeks    PT Treatment/Interventions  ADLs/Self Care Home Management;Electrical Stimulation;Gait training;Stair  training;Functional mobility training;Therapeutic activities;Neuromuscular re-education;Balance training;Therapeutic exercise;Patient/family education;Manual techniques;Manual lymph drainage;Compression bandaging;Passive range of motion;Taping;Vasopneumatic Device;Joint Manipulations    Consulted and Agree with Plan of Care  Patient       Patient will benefit from skilled therapeutic intervention in order to improve the following deficits and impairments:  Abnormal gait, Decreased balance, Decreased endurance, Dizziness, Decreased activity tolerance, Improper body mechanics  Visit Diagnosis: Endometrial cancer ([place])  Other abnormalities of gait and mobility  Physical debility  Muscle weakness (generalized)     Problem List Patient Active Problem List   Diagnosis Date Noted  . Deficiency anemia 03/24/2019  . Hypokalemia due to inadequate potassium intake 03/24/2019  . Hypomagnesemia 03/24/2019  . Physical debility 03/24/2019  . Pancytopenia, acquired ([place]) 02/03/2019  . 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 Bone pain 11/10/2018  . Encounter for antineoplastic chemotherapy 10/20/2018  . 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 Goals of care, counseling/discussion 10/14/2018  . CKD (chronic kidney disease), stage III 10/14/2018  . Genetic testing 07/14/2017  . History of breast cancer 06/25/2017  . Family history of stomach cancer   . Vaginal mass 02/13/2017  . Thyroid nodule 02/13/2017  . Pulmonary nodules/lesions, multiple 02/13/2017  . Endometrial cancer ([place]) 09/30/2016  . HTN (hypertension) 01/17/2014  . Chest pain at rest 01/17/2014  . Hyperlipidemia 04/03/2011  . OTHER SPECIFIED DISORDER OF STOMACH AND DUODENUM 10/19/2009    Vicki Cervantes, PT 04/16/2019, 12:12 PM  Vicki Cervantes  [place], [place], [address] Phone: [phone]   Fax:  [phone]  Name: Vicki Cervantes MRN:  [id number] Date of Birth: September 01, 1940

## 2019-04-23 ENCOUNTER — Ambulatory Visit: Payer: Medicare HMO

## 2019-04-23 ENCOUNTER — Other Ambulatory Visit: Payer: Self-pay

## 2019-04-23 DIAGNOSIS — M6281 Muscle weakness (generalized): Secondary | ICD-10-CM | POA: Diagnosis not present

## 2019-04-23 DIAGNOSIS — R5381 Other malaise: Secondary | ICD-10-CM | POA: Diagnosis not present

## 2019-04-23 DIAGNOSIS — R2689 Other abnormalities of gait and mobility: Secondary | ICD-10-CM

## 2019-04-23 DIAGNOSIS — C541 Malignant neoplasm of endometrium: Secondary | ICD-10-CM | POA: Diagnosis not present

## 2019-04-23 NOTE — Therapy (Signed)
[place], [place], [address] Phone: [phone]   Fax:  [phone]  Physical Therapy Treatment  Patient Details  Name: Vicki Cervantes MRN: [id number] Date of Birth: 1941/03/03 Referring Provider (PT): [name]   Encounter Date: 04/23/2019  PT End of Session - 04/23/19 1001    Visit Number  4    Number of Visits  17    Date for PT Re-Evaluation  05/05/19    PT Start Time  1000    PT Stop Time  1055    PT Time Calculation (min)  55 min    Activity Tolerance  Patient tolerated treatment well    Behavior During Therapy  [facility] for tasks assessed/performed       Past Medical History:  Diagnosis Date  . Aortic atherosclerosis ([place])   . Arthritis   . Blood transfusion without reported diagnosis   . Breast cancer ([place])     LEFT, '82-left breast cancer-surgery only  . Cataract    removed both eyes  . Complication of anesthesia    nausea  . Diverticulosis   . Esophageal stricture   . Family history of stomach cancer   . GERD (gastroesophageal reflux disease)   . Hemorrhoids   . Hiatal hernia   . High triglycerides   . Hypercholesteremia   . Hypertension   . Hypothyroidism   . Osteoarthritis   . PONV (postoperative nausea and vomiting)   . Uterine cancer [facility])     Past Surgical History:  Procedure Laterality Date  . COLONOSCOPY    . EUS N/A 01/07/2013   Procedure: UPPER ENDOSCOPIC ULTRASOUND (EUS) LINEAR;  Surgeon: Milus Banister, MD;  Location: [facility];  Service: Endoscopy;  Laterality: N/A;  . EUS N/A 01/05/2015   Procedure: UPPER ENDOSCOPIC ULTRASOUND (EUS) LINEAR;  Surgeon: Milus Banister, MD;  Location: [facility];  Service: Endoscopy;  Laterality: N/A;  . FOOT SURGERY Left 2009-2010   x2. Surgery in 2011  . IR IMAGING GUIDED PORT INSERTION  10/19/2018  . LEFT HEART CATHETERIZATION WITH CORONARY ANGIOGRAM N/A 01/24/2014   Procedure: LEFT HEART CATHETERIZATION WITH CORONARY  ANGIOGRAM;  Surgeon: Peter M Martinique, MD;  Location: [facility] CATH LAB;  Service: Cardiovascular;  Laterality: N/A;  . LUMBAR [place] SURGERY  01-01-13  . MASTECTOMY, RADICAL Left 1982  . OVARY SURGERY  1972-1973  . ROBOTIC ASSISTED TOTAL HYSTERECTOMY WITH BILATERAL SALPINGO OOPHERECTOMY N/A 10/22/2016   Procedure: XI ROBOTIC ASSISTED TOTAL HYSTERECTOMY WITH RIGHT SALPINGO OOPHORECTOMY; INJECTION OF GREEN DYE WITH EXCISION OF BILATERAL PELVIC LYMPH NODES;  Surgeon: Janie Morning, MD;  [facility]: [facility];  Service: Gynecology;  Laterality: N/A;  . ROTATOR CUFF REPAIR Left   . SENTINEL NODE BIOPSY N/A 10/22/2016   Procedure: SENTINEL NODE BIOPSY;  Surgeon: Janie Morning, MD;  Location: [facility];  Service: Gynecology;  Laterality: N/A;  . SIMPLE MASTECTOMY Left 1982  . TOTAL KNEE ARTHROPLASTY Bilateral 2009-2010   x2  . UPPER GASTROINTESTINAL ENDOSCOPY      There were no vitals filed for this visit.  Subjective Assessment - 04/23/19 1000    Subjective  Pt states that she has been performing her exercises at home and that the VOR activities are getting easy. She states that she went to the grocery store for the first time this week and it went really well.    Pertinent History  Pt has a history of L radical mastectomy when she was 41 and R simple mastectomy. No previous  signs of edema in her B arms. Pt had a hysterectomy 2 years ago due to endometrial cancer which returned this year.    Currently in Pain?  No/denies                       [facility] Adult PT Treatment/Exercise - 04/23/19 0001      Dynamic Standing Balance   Forward lean/weight shifting comments:  Ankle pendulums 10x  significant improvement with fluidity since initial evaluation    Step ups comments:  Foam step overs w/intermittent UE support and SBA w/occasional CGA 10x B VC for focusing on the planted foot in order to improve proprioceptive input.     Dynamic Standing - Comments  side stepping with yellow TB at knees for  increased difficulty. Discussed safety using the band with hand support nearby to decrease risk for falls. Tandem gait w/education on COM over BOS to answer pt questions on wgt distribution at the feet in heels or toes, .       Neuro Re-ed    Neuro Re-ed Details   VOR 3x 1 min VC to increase speed due to less symptoms today. Standing VOR activities initiated with good results pt had no LOB slight increase in symptoms with increased speed that decreased with repetitions.       Knee/Hip Exercises: Standing   Heel Raises  Both;1 set;10 reps    Heel Raises Limitations  VC for getting greater height w/more ROM for challenge    Other Standing Knee Exercises  Toe raises 2x 10 demonstration for correct movement and to decreae compensation with hip sway       Knee/Hip Exercises: Supine   Bridges  Both;Strengthening;15 reps;2 sets    Bridges Limitations  yellow TB at the knees for glute activation, 1x VC for glute activation on ascending.       Knee/Hip Exercises: Sidelying   Hip ABduction  Strengthening;10 reps;Both    Hip ABduction Limitations  Pt requires tactile cueing and VC in order to prevent compensation due to weakness w/posterior pelvic rotation and LE hip flexion during hip abd yellow therband for resistance with clamshell ( 2 sets) and hip abd (1 set)             PT Education - 04/23/19 1017    Education Details  Pt educated to continue with current HEP at this time. Discussed correct way to perform VOR exercise due to pt was performing saccades away from paper instead of VOR and then was performing saccades correctly using sticky notes on the wall. Provided w/yellow theraband to add to side stepping on HEP    Person(s) Educated  Patient    Methods  Explanation;Demonstration    Comprehension  Verbalized understanding;Returned demonstration       PT Short Term Goals - 04/05/19 1239      PT SHORT TERM GOAL #1   Title  Pt will be able to state precautions and risk factors for  lymphedema within 4 weeks in order to decrease risk for lymphedema in the future related to lymphnode removal and surgery.    Baseline  Pt is able to name 2 lymphedema facts.    Time  4    Period  Weeks    Status  New    Target Date  05/10/19        PT Long Term Goals - 04/05/19 1240      PT LONG TERM GOAL #1   Title  Pt will  perform 52/56 on the BERG balance test in order to demonstrate a decrease risk for falls.    Baseline  39/56    Time  8    Period  Weeks    Status  New    Target Date  06/07/19      PT LONG TERM GOAL #2   Title  The patient will maintain single limb stance x 10 seconds bilaterally.    Baseline  Pt is able to hold her RLE up for 5 seconds or less and LLE for 3 seconds.    Time  8    Period  Weeks    Status  New    Target Date  06/07/19      PT LONG TERM GOAL #3   Title  The patient will return demo HEP for core strength, L hip stretching/strengthening and high level balance.    Baseline  Pt is working on HEP at home.    Time  8    Period  Weeks    Status  New    Target Date  06/07/19            Plan - 04/23/19 1002    Clinical Impression Statement  Pt presents to physical therapy today with significant improvements in her balance since her initial evaluation as demonstrated by improved fluidity and no LOB during ankle pendulums, increased ROM w/heel raises/toe raises and less guarding during gait activiites with SBA/occasional CGA and intermittent UE support on counter. Resistance added to side stepping at home due to improved functional strength and side-lying hip strengthening progressed; pt continues to require tactile cueing at pelvis to prevent compensation w/posterior rotation of the pelvis. Pt was able to perform VOR activity with significant decrease in dizziness from her last session. Standing VOR activities initiated and increased speed with good signs of habituation including slight increase in symptoms that decreased with repetitions and no  LOB in standing. Pt will benefit from continued POC.    Personal Factors and Comorbidities  Comorbidity 3+    Comorbidities  CKD stage III, hx breast cancer, endometrial cancer    Examination-Activity Limitations  Other;Locomotion Level    PT Treatment/Interventions  ADLs/Self Care Home Management;Electrical Stimulation;Gait training;Stair training;Functional mobility training;Therapeutic activities;Neuromuscular re-education;Balance training;Therapeutic exercise;Patient/family education;Manual techniques;Manual lymph drainage;Compression bandaging;Passive range of motion;Taping;Vasopneumatic Device;Joint Manipulations    PT Next Visit Plan  Continue with balance exercises and add VOR  activities with ball and on foam pad.    PT Home Exercise Plan  Access Code: [email or web address]    Consulted and Agree with Plan of Care  Patient       Patient will benefit from skilled therapeutic intervention in order to improve the following deficits and impairments:  Abnormal gait, Decreased balance, Decreased endurance, Dizziness, Decreased activity tolerance, Improper body mechanics  Visit Diagnosis: Endometrial cancer ([place])  Other abnormalities of gait and mobility  Physical debility  Muscle weakness (generalized)     Problem List Patient Active Problem List   Diagnosis Date Noted  . Deficiency anemia 03/24/2019  . Hypokalemia due to inadequate potassium intake 03/24/2019  . Hypomagnesemia 03/24/2019  . Physical debility 03/24/2019  . Pancytopenia, acquired ([place]) 02/03/2019  . 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 Bone pain 11/10/2018  . Encounter for antineoplastic chemotherapy 10/20/2018  . 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 Goals of care, counseling/discussion 10/14/2018  . CKD (chronic kidney disease), stage III 10/14/2018  . Genetic testing 07/14/2017  . History of breast cancer 06/25/2017  .  Family history of stomach cancer   . Vaginal mass 02/13/2017  . Thyroid nodule  02/13/2017  . Pulmonary nodules/lesions, multiple 02/13/2017  . Endometrial cancer ([place]) 09/30/2016  . HTN (hypertension) 01/17/2014  . Chest pain at rest 01/17/2014  . Hyperlipidemia 04/03/2011  . OTHER SPECIFIED DISORDER OF STOMACH AND DUODENUM 10/19/2009    Ander Purpura, PT 04/23/2019, 10:58 AM  [place], [place], [address] Phone: [phone]   Fax:  [phone]  Name: KASYN APFELBAUM MRN: [id number] Date of Birth: 01-22-1941

## 2019-04-26 DIAGNOSIS — H26491 Other secondary cataract, right eye: Secondary | ICD-10-CM | POA: Diagnosis not present

## 2019-04-26 DIAGNOSIS — H35372 Puckering of macula, left eye: Secondary | ICD-10-CM | POA: Diagnosis not present

## 2019-04-30 ENCOUNTER — Ambulatory Visit: Payer: Medicare HMO

## 2019-04-30 DIAGNOSIS — R5381 Other malaise: Secondary | ICD-10-CM

## 2019-04-30 DIAGNOSIS — R2689 Other abnormalities of gait and mobility: Secondary | ICD-10-CM

## 2019-04-30 DIAGNOSIS — M6281 Muscle weakness (generalized): Secondary | ICD-10-CM

## 2019-04-30 DIAGNOSIS — C541 Malignant neoplasm of endometrium: Secondary | ICD-10-CM | POA: Diagnosis not present

## 2019-04-30 NOTE — Patient Instructions (Signed)
Access Code: [email or web address]  URL: [email or web address]  Date: 04/30/2019  Prepared by: Tomma Rakers   Exercises Side Stepping with Counter Support - 4 reps - 1x daily - 7x weekly Tandem Walking with Counter Support - 4 reps - 3 sets - 1x daily - 7x weekly Supine Bridge with Resistance Band - 15 reps - 2 sets - 1x daily - 7x weekly Sidelying Hip Abduction - 10 reps - 2 sets - 1x daily - 7x weekly Seated Gaze Stabilization with Head Rotation - 1 reps - 3 sets - 1 minuted timed head rotations resting to your baseline before beginning the next minute hold - 1x daily - 7x weekly Diona Foley Toss with Eye Tracking - 20 reps - 3 sets - 1x daily - 7x weekly

## 2019-04-30 NOTE — Therapy (Signed)
[place], [place], [address] Phone: [phone]   Fax:  [phone]  Physical Therapy Treatment  Patient Details  Name: Vicki Cervantes MRN: [id number] Date of Birth: 09-02-1940 Referring Provider (PT): [name]   Encounter Date: 04/30/2019  PT End of Session - 04/30/19 1010    Visit Number  5    Number of Visits  17    Date for PT Re-Evaluation  05/05/19    PT Start Time  1008    PT Stop Time  1053    PT Time Calculation (min)  45 min    Activity Tolerance  Patient tolerated treatment well    Behavior During Therapy  [facility] for tasks assessed/performed       Past Medical History:  Diagnosis Date  . Aortic atherosclerosis ([place])   . Arthritis   . Blood transfusion without reported diagnosis   . Breast cancer ([place])     LEFT, '82-left breast cancer-surgery only  . Cataract    removed both eyes  . Complication of anesthesia    nausea  . Diverticulosis   . Esophageal stricture   . Family history of stomach cancer   . GERD (gastroesophageal reflux disease)   . Hemorrhoids   . Hiatal hernia   . High triglycerides   . Hypercholesteremia   . Hypertension   . Hypothyroidism   . Osteoarthritis   . PONV (postoperative nausea and vomiting)   . Uterine cancer [facility])     Past Surgical History:  Procedure Laterality Date  . COLONOSCOPY    . EUS N/A 01/07/2013   Procedure: UPPER ENDOSCOPIC ULTRASOUND (EUS) LINEAR;  Surgeon: Milus Banister, MD;  [facility];  Service: Endoscopy;  Laterality: N/A;  . EUS N/A 01/05/2015   Procedure: UPPER ENDOSCOPIC ULTRASOUND (EUS) LINEAR;  Surgeon: Milus Banister, MD;  [facility];  Service: Endoscopy;  Laterality: N/A;  . FOOT SURGERY Left 2009-2010   x2. Surgery in 2011  . IR IMAGING GUIDED PORT INSERTION  10/19/2018  . LEFT HEART CATHETERIZATION WITH CORONARY ANGIOGRAM N/A 01/24/2014   Procedure: LEFT HEART CATHETERIZATION WITH CORONARY  ANGIOGRAM;  Surgeon: Peter M Martinique, MD;  Location: [facility] CATH LAB;  Service: Cardiovascular;  Laterality: N/A;  . LUMBAR [place] SURGERY  01-01-13  . MASTECTOMY, RADICAL Left 1982  . OVARY SURGERY  1972-1973  . ROBOTIC ASSISTED TOTAL HYSTERECTOMY WITH BILATERAL SALPINGO OOPHERECTOMY N/A 10/22/2016   Procedure: XI ROBOTIC ASSISTED TOTAL HYSTERECTOMY WITH RIGHT SALPINGO OOPHORECTOMY; INJECTION OF GREEN DYE WITH EXCISION OF BILATERAL PELVIC LYMPH NODES;  Surgeon: Janie Morning, MD;  [facility];  Service: Gynecology;  Laterality: N/A;  . ROTATOR CUFF REPAIR Left   . SENTINEL NODE BIOPSY N/A 10/22/2016   Procedure: SENTINEL NODE BIOPSY;  Surgeon: Janie Morning, MD;  [facility];  Service: Gynecology;  Laterality: N/A;  . SIMPLE MASTECTOMY Left 1982  . TOTAL KNEE ARTHROPLASTY Bilateral 2009-2010   x2  . UPPER GASTROINTESTINAL ENDOSCOPY      There were no vitals filed for this visit.  Subjective Assessment - 04/30/19 1011    Subjective  Pt reports that her exercises have been going well at home. She states that she has no pain. She is still experiencing trouble performing tandem gait. pt states she is experiencing difficulty w/placement of band during side stepping activity.    Pertinent History  Pt has a history of L radical mastectomy when she was 41 and R simple mastectomy. No previous  signs of edema in her B arms. Pt had a hysterectomy 2 years ago due to endometrial cancer which returned this year.    Currently in Pain?  No/denies                       [facility] Adult PT Treatment/Exercise - 04/30/19 0001      Dynamic Standing Balance   Step ups comments:  sit to stand 2x 10  from slightly elevated surface, VC to prevent knee valgus Bil.       Neuro Re-ed    Neuro Re-ed Details   Ball toss in standing w/therapist throwing ball 2x 20 in corner for safety standing on airex, Ball toss VOR activity standing on airex vertical w/close SBA in [place] for safety pt touched wall 1x  on the L 3x for 20 tosses initially would miss 2-3 then improve demonstrating improved proprioception/VOR. Seated ball bounce with gaze stabilization for 30 seconds with slight increase in vertigo reported that decreased after ~10 seconds. VC to keep hands on thighs during ball activity.       Knee/Hip Exercises: Supine   Bridges  Both;Strengthening;2 sets;15 reps    Bridges Limitations  Green TB at the knee for glute activation. Improved ROM demonstrating improved strength.     Other Supine Knee/Hip Exercises  Supine hip abd 20x VC for foot placement. and slow movement      Knee/Hip Exercises: Sidelying   Hip ABduction  Strengthening;10 reps;Both;2 sets    Hip ABduction Limitations  2 sets Bil. Pt continues to require tactile cueing and VC in order to prevent compensation due to weakness w/posterior pelvic rotation and LE hip flexion             PT Education - 04/30/19 1059    Education Details  Pt educated where to place the band during side stepping activity. Pt will perform her new HEP e-mailed due to printer was not working. Dynamic VOR activities increase and re-iterated education on letting symptoms comletely subside before beginning next repetition.    Person(s) Educated  Patient    Methods  Explanation;Demonstration    Comprehension  Verbalized understanding       PT Short Term Goals - 04/05/19 1239      PT SHORT TERM GOAL #1   Title  Pt will be able to state precautions and risk factors for lymphedema within 4 weeks in order to decrease risk for lymphedema in the future related to lymphnode removal and surgery.    Baseline  Pt is able to name 2 lymphedema facts.    Time  4    Period  Weeks    Status  New    Target Date  05/10/19        PT Long Term Goals - 04/05/19 1240      PT LONG TERM GOAL #1   Title  Pt will perform 52/56 on the BERG balance test in order to demonstrate a decrease risk for falls.    Baseline  39/56    Time  8    Period  Weeks    Status   New    Target Date  06/07/19      PT LONG TERM GOAL #2   Title  The patient will maintain single limb stance x 10 seconds bilaterally.    Baseline  Pt is able to hold her RLE up for 5 seconds or less and LLE for 3 seconds.    Time  8  Period  Weeks    Status  New    Target Date  06/07/19      PT LONG TERM GOAL #3   Title  The patient will return demo HEP for core strength, L hip stretching/strengthening and high level balance.    Baseline  Pt is working on HEP at home.    Time  8    Period  Weeks    Status  New    Target Date  06/07/19            Plan - 04/30/19 1010    Clinical Impression Statement  Pt presents to physical therapy with improved balance since her initial evaluation as demonstrated by ability to stand on airex and catch ball in all directions w/only 1x loss of balance. Pt was able to tolerate an increase in difficulty w/ther-ex this session w/decreased compensation this session. Pt demonstrated continued habituation w/VOR as demonstrated by symptoms of dizziness decreased after 10 seconds following slight increase from VOR activities. Pt HEP was updated for pelvic strengthening this session and dynamic VOR activities in standing. Pt will benefit form continued POC.    Personal Factors and Comorbidities  Comorbidity 3+    Comorbidities  CKD stage III, hx breast cancer, endometrial cancer    Examination-Activity Limitations  Other;Locomotion Level    PT Frequency  2x / week    PT Duration  8 weeks    PT Treatment/Interventions  ADLs/Self Care Home Management;Electrical Stimulation;Gait training;Stair training;Functional mobility training;Therapeutic activities;Neuromuscular re-education;Balance training;Therapeutic exercise;Patient/family education;Manual techniques;Manual lymph drainage;Compression bandaging;Passive range of motion;Taping;Vasopneumatic Device;Joint Manipulations    PT Next Visit Plan  Continue with balance exercises and add VOR  activities with ball  and on foam pad. Progress note next session.    PT Home Exercise Plan  Access Code: [email or web address]    Consulted and Agree with Plan of Care  Patient       Patient will benefit from skilled therapeutic intervention in order to improve the following deficits and impairments:  Abnormal gait, Decreased balance, Decreased endurance, Dizziness, Decreased activity tolerance, Improper body mechanics  Visit Diagnosis: Endometrial cancer ([place])  Muscle weakness (generalized)  Other abnormalities of gait and mobility  Physical debility     Problem List Patient Active Problem List   Diagnosis Date Noted  . Deficiency anemia 03/24/2019  . Hypokalemia due to inadequate potassium intake 03/24/2019  . Hypomagnesemia 03/24/2019  . Physical debility 03/24/2019  . Pancytopenia, acquired ([place]) 02/03/2019  . 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 Bone pain 11/10/2018  . Encounter for antineoplastic chemotherapy 10/20/2018  . 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 Goals of care, counseling/discussion 10/14/2018  . CKD (chronic kidney disease), stage III 10/14/2018  . Genetic testing 07/14/2017  . History of breast cancer 06/25/2017  . Family history of stomach cancer   . Vaginal mass 02/13/2017  . Thyroid nodule 02/13/2017  . Pulmonary nodules/lesions, multiple 02/13/2017  . Endometrial cancer ([place]) 09/30/2016  . HTN (hypertension) 01/17/2014  . Chest pain at rest 01/17/2014  . Hyperlipidemia 04/03/2011  . OTHER SPECIFIED DISORDER OF STOMACH AND DUODENUM 10/19/2009    Ander Purpura, PT 04/30/2019, 12:05 PM  [place], [place], [address] Phone: [phone]   Fax:  [phone]  Name: Vicki Cervantes MRN: [id number] Date of Birth: 01/01/1941

## 2019-05-04 ENCOUNTER — Ambulatory Visit: Payer: Medicare HMO

## 2019-05-04 ENCOUNTER — Other Ambulatory Visit: Payer: Self-pay

## 2019-05-04 ENCOUNTER — Other Ambulatory Visit: Payer: Self-pay | Admitting: Hematology and Oncology

## 2019-05-04 DIAGNOSIS — M6281 Muscle weakness (generalized): Secondary | ICD-10-CM

## 2019-05-04 DIAGNOSIS — C541 Malignant neoplasm of endometrium: Secondary | ICD-10-CM | POA: Diagnosis not present

## 2019-05-04 DIAGNOSIS — R2689 Other abnormalities of gait and mobility: Secondary | ICD-10-CM

## 2019-05-04 DIAGNOSIS — R5381 Other malaise: Secondary | ICD-10-CM | POA: Diagnosis not present

## 2019-05-04 NOTE — Therapy (Addendum)
[place], [place], [address] Phone: [phone]   Fax:  [phone]  Physical Therapy Progress Note  Dates of Service: 04/05/19 initial Evaluation                              05/04/19 Progress Note   Patient Details  Name: RICHLYNN GAN MRN: [id number] Date of Birth: [birth year] Referring Provider (PT): [name]   Encounter Date: 05/04/2019  PT End of Session - 05/04/19 1107    Visit Number  6    Number of Visits  17    Date for PT Re-Evaluation  06/01/19    PT Start Time  1104    PT Stop Time  1157    PT Time Calculation (min)  53 min    Activity Tolerance  Patient tolerated treatment well    Behavior During Therapy  [facility] for tasks assessed/performed       Past Medical History:  Diagnosis Date  . Aortic atherosclerosis ([place])   . Arthritis   . Blood transfusion without reported diagnosis   . Breast cancer ([place])     LEFT, '82-left breast cancer-surgery only  . Cataract    removed both eyes  . Complication of anesthesia    nausea  . Diverticulosis   . Esophageal stricture   . Family history of stomach cancer   . GERD (gastroesophageal reflux disease)   . Hemorrhoids   . Hiatal hernia   . High triglycerides   . Hypercholesteremia   . Hypertension   . Hypothyroidism   . Osteoarthritis   . PONV (postoperative nausea and vomiting)   . Uterine cancer [facility])     Past Surgical History:  Procedure Laterality Date  . COLONOSCOPY    . EUS N/A 01/07/2013   Procedure: UPPER ENDOSCOPIC ULTRASOUND (EUS) LINEAR;  Surgeon: Milus Banister, MD;  Location: [facility] ENDOSCOPY;  Service: Endoscopy;  Laterality: N/A;  . EUS N/A 01/05/2015   Procedure: UPPER ENDOSCOPIC ULTRASOUND (EUS) LINEAR;  Surgeon: Milus Banister, MD;  Location: [facility] ENDOSCOPY;  Service: Endoscopy;  Laterality: N/A;  . FOOT SURGERY Left 2009-2010   x2. Surgery in 2011  . IR IMAGING GUIDED PORT INSERTION  10/19/2018  . LEFT HEART  CATHETERIZATION WITH CORONARY ANGIOGRAM N/A 01/24/2014   Procedure: LEFT HEART CATHETERIZATION WITH CORONARY ANGIOGRAM;  Surgeon: Peter M Martinique, MD;  Location: [facility] CATH LAB;  Service: Cardiovascular;  Laterality: N/A;  . LUMBAR [place] SURGERY  01-01-13  . MASTECTOMY, RADICAL Left 1982  . OVARY SURGERY  1972-1973  . ROBOTIC ASSISTED TOTAL HYSTERECTOMY WITH BILATERAL SALPINGO OOPHERECTOMY N/A 10/22/2016   Procedure: XI ROBOTIC ASSISTED TOTAL HYSTERECTOMY WITH RIGHT SALPINGO OOPHORECTOMY; INJECTION OF GREEN DYE WITH EXCISION OF BILATERAL PELVIC LYMPH NODES;  Surgeon: Janie Morning, MD;  Location: [facility] ORS;  Service: Gynecology;  Laterality: N/A;  . ROTATOR CUFF REPAIR Left   . SENTINEL NODE BIOPSY N/A 10/22/2016   Procedure: SENTINEL NODE BIOPSY;  Surgeon: Janie Morning, MD;  Location: [facility] ORS;  Service: Gynecology;  Laterality: N/A;  . SIMPLE MASTECTOMY Left 1982  . TOTAL KNEE ARTHROPLASTY Bilateral 2009-2010   x2  . UPPER GASTROINTESTINAL ENDOSCOPY      There were no vitals filed for this visit.  Subjective Assessment - 05/04/19 1105    Subjective  Pt reports that she was cooking a lot for family and is a little sore in her calves. She states that other  than that she does not have any pain and has been working on her exercises at home.    Pertinent History  Pt has a history of L radical mastectomy when she was 41 and R simple mastectomy. No previous signs of edema in her B arms. Pt had a hysterectomy 2 years ago due to endometrial cancer which returned this year.    Currently in Pain?  No/denies                       [facility] Adult PT Treatment/Exercise - 05/04/19 0001      Balance   Balance Assessed  Yes      Dynamic Standing Balance   Step ups comments:  sit to stand 2x 10 on airex from slightly elevated surface, VC to prevent knee valgus Bil.       Standardized Balance Assessment   Standardized Balance Assessment  Berg Balance Test      Berg Balance Test   Sit to Stand   Able to stand without using hands and stabilize independently    Standing Unsupported  Able to stand safely 2 minutes    Sitting with Back Unsupported but Feet Supported on Floor or Stool  Able to sit safely and securely 2 minutes    Stand to Sit  Sits safely with minimal use of hands    Transfers  Able to transfer safely, minor use of hands    Standing Unsupported with Eyes Closed  Able to stand 10 seconds safely    Standing Ubsupported with Feet Together  Able to place feet together independently and stand 1 minute safely    From Standing, Reach Forward with Outstretched Arm  Can reach forward >12 cm safely (5")    From Standing Position, Pick up Object from Floor  Able to pick up shoe safely and easily    From Standing Position, Turn to Look Behind Over each Shoulder  Looks behind from both sides and weight shifts well    Turn 360 Degrees  Able to turn 360 degrees safely in 4 seconds or less    Standing Unsupported, Alternately Place Feet on Step/Stool  Able to complete 4 steps without aid or supervision    Standing Unsupported, One Foot in Front  Able to plae foot ahead of the other independently and hold 30 seconds    Standing on One Leg  Able to lift leg independently and hold equal to or more than 3 seconds    Total Score  50      Neuro Re-ed    Neuro Re-ed Details   Stepping up onto BOSU with 3 second hold unilateral UE support and SBA-CGA 10x Bil, Flat side up BOSU lateral wgt shifts/fwd back wgt shifts 20x each direction with SBA-CGA and intermittent UE support. Improvement with continued repetitions demonstrating improved proprioceptive input. VOR eye tracking with tennis ball on airex in corner for safety with supervision 20x improved tracking since last session pt had less anticipation of direction of the ball. Ball bouncing on airex in corner for safety with good accuracy 5x miss with physical therapist assist reaching outside BOS.       Knee/Hip Exercises: Supine   Bridges   Both;Strengthening;2 sets;15 reps    Bridges Limitations  heel closer to glutes with greater ROM than previously VC for keeping knees/feet apart to adequately activate glutes      Knee/Hip Exercises: Sidelying   Hip ABduction  Strengthening;10 reps;Both;2 sets    Hip  ABduction Limitations  2 sets Bil. Pt continues to require tactile cueing and VC in order to prevent compensation due to weakness w/posterior pelvic rotation and LE hip flexion             PT Education - 05/04/19 1154    Education Details  Pt will continue with current HEP. Re-iterated educationon signs/symptoms of lymphedema and risk reduction practices including avoiding tight clothing, soaking in hot tubs and stayingout in hot weather for long periods of time. Discussed heaviness, aching and increased swelling in one or both legs to alert MD.    Person(s) Educated  Patient    Methods  Explanation    Comprehension  Verbalized understanding       PT Short Term Goals - 05/04/19 1108      PT SHORT TERM GOAL #1   Title  Pt will be able to state precautions and risk factors for lymphedema within 4 weeks in order to decrease risk for lymphedema in the future related to lymphnode removal and surgery.    Baseline  Pt remembers what lymphedema is and is able to ask if leggings are an appropriate clothing for lymphedema prevention    Time  4    Period  Weeks    Status  On-going    Target Date  05/10/19        PT Long Term Goals - 05/04/19 1110      PT LONG TERM GOAL #1   Title  Pt will perform 52/56 on the BERG balance test in order to demonstrate a decrease risk for falls.    Baseline  50/56    Time  4    Period  Weeks    Status  On-going    Target Date  06/08/19      PT LONG TERM GOAL #2   Title  The patient will maintain single limb stance x 10 seconds bilaterally.    Baseline  RLE 7 seconds, LLE 2 seconds with multiple attempts    Time  4    Period  Weeks    Status  On-going    Target Date  06/08/19       PT LONG TERM GOAL #3   Title  The patient will return demo HEP for core strength, L hip stretching/strengthening and high level balance.    Baseline  Pt is working on HEP at home.    Time  4    Period  Weeks    Status  On-going    Target Date  06/08/19            Plan - 05/04/19 1107    Clinical Impression Statement  Pt presents to physical therapy this session with significant improvement in her overall balance since her initial evaluation as demonstrated by scoring 50/56 from 39/56 on the BERG balance score bringing her up from a high fall risk to a moderate fall risk. She is continuing to make progress in all of her other goals; re-iterated education on lymphedema due to pt has difficulty reporting signs to look for to alert MD of possible lymphedema. She was able to tolerate a significant increase in difficulty with ther-ex/balance activities this session w/o an increase in pain or LOB. Pt reports decreased dizziness and demontsrated less LOB with VOR activities this session. Pt will benefit from continued physical therapy 2x/week for 4 more weeks in order to address the above limitations.    Personal Factors and Comorbidities  Comorbidity 3+    Comorbidities  CKD stage III, hx breast cancer, endometrial cancer    Examination-Activity Limitations  Other;Locomotion Level    PT Frequency  2x / week    PT Duration  4 weeks    PT Treatment/Interventions  ADLs/Self Care Home Management;Electrical Stimulation;Gait training;Stair training;Functional mobility training;Therapeutic activities;Neuromuscular re-education;Balance training;Therapeutic exercise;Patient/family education;Manual techniques;Manual lymph drainage;Compression bandaging;Passive range of motion;Taping;Vasopneumatic Device;Joint Manipulations    PT Home Exercise Plan  Access Code: [email or web address]    Consulted and Agree with Plan of Care  Patient       Patient will benefit from skilled therapeutic intervention in order to improve  the following deficits and impairments:  Abnormal gait, Decreased balance, Decreased endurance, Dizziness, Decreased activity tolerance, Improper body mechanics  Visit Diagnosis: Endometrial cancer ([place])  Muscle weakness (generalized)  Other abnormalities of gait and mobility  Physical debility     Problem List Patient Active Problem List   Diagnosis Date Noted  . Deficiency anemia 03/24/2019  . Hypokalemia due to inadequate potassium intake 03/24/2019  . Hypomagnesemia 03/24/2019  . Physical debility 03/24/2019  . Pancytopenia, acquired ([place]) 02/03/2019  . 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 Bone pain 11/10/2018  . Encounter for antineoplastic chemotherapy 10/20/2018  . 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 Goals of care, counseling/discussion 10/14/2018  . CKD (chronic kidney disease), stage III 10/14/2018  . Genetic testing 07/14/2017  . History of breast cancer 06/25/2017  . Family history of stomach cancer   . Vaginal mass 02/13/2017  . Thyroid nodule 02/13/2017  . Pulmonary nodules/lesions, multiple 02/13/2017  . Endometrial cancer ([place]) 09/30/2016  . HTN (hypertension) 01/17/2014  . Chest pain at rest 01/17/2014  . Hyperlipidemia 04/03/2011  . OTHER SPECIFIED DISORDER OF STOMACH AND DUODENUM 10/19/2009     Ander Purpura, PT 05/04/2019, 12:02 PM  [place], [place], [address] Phone: [phone]   Fax:  [phone]  Name: KHRYSTINA ALSBROOK MRN: [id number] Date of Birth: 1940/11/07

## 2019-05-04 NOTE — Addendum Note (Signed)
Addended by: Ander Purpura on: 05/04/2019 04:12 PM   Modules accepted: Orders

## 2019-05-05 ENCOUNTER — Inpatient Hospital Stay: Payer: Medicare HMO

## 2019-05-05 ENCOUNTER — Inpatient Hospital Stay (HOSPITAL_BASED_OUTPATIENT_CLINIC_OR_DEPARTMENT_OTHER): Payer: Medicare HMO | Admitting: Hematology and Oncology

## 2019-05-05 ENCOUNTER — Inpatient Hospital Stay: Payer: Medicare HMO | Attending: Gynecologic Oncology

## 2019-05-05 ENCOUNTER — Other Ambulatory Visit: Payer: Self-pay

## 2019-05-05 DIAGNOSIS — Z5112 Encounter for antineoplastic immunotherapy: Secondary | ICD-10-CM | POA: Diagnosis present

## 2019-05-05 DIAGNOSIS — N183 Chronic kidney disease, stage 3 unspecified: Secondary | ICD-10-CM | POA: Insufficient documentation

## 2019-05-05 DIAGNOSIS — Z90722 Acquired absence of ovaries, bilateral: Secondary | ICD-10-CM | POA: Insufficient documentation

## 2019-05-05 DIAGNOSIS — Z9079 Acquired absence of other genital organ(s): Secondary | ICD-10-CM | POA: Diagnosis not present

## 2019-05-05 DIAGNOSIS — Z90710 Acquired absence of both cervix and uterus: Secondary | ICD-10-CM | POA: Diagnosis not present

## 2019-05-05 DIAGNOSIS — C541 Malignant neoplasm of endometrium: Secondary | ICD-10-CM

## 2019-05-05 DIAGNOSIS — D631 Anemia in chronic kidney disease: Secondary | ICD-10-CM | POA: Diagnosis not present

## 2019-05-05 DIAGNOSIS — Z79899 Other long term (current) drug therapy: Secondary | ICD-10-CM | POA: Diagnosis not present

## 2019-05-05 DIAGNOSIS — Z7982 Long term (current) use of aspirin: Secondary | ICD-10-CM | POA: Diagnosis not present

## 2019-05-05 DIAGNOSIS — D630 Anemia in neoplastic disease: Secondary | ICD-10-CM

## 2019-05-05 DIAGNOSIS — Z7189 Other specified counseling: Secondary | ICD-10-CM

## 2019-05-05 DIAGNOSIS — C773 Secondary and unspecified malignant neoplasm of axilla and upper limb lymph nodes: Secondary | ICD-10-CM | POA: Diagnosis not present

## 2019-05-05 DIAGNOSIS — Z170 Estrogen receptor positive status [ER+]: Secondary | ICD-10-CM | POA: Insufficient documentation

## 2019-05-05 DIAGNOSIS — M1990 Unspecified osteoarthritis, unspecified site: Secondary | ICD-10-CM | POA: Insufficient documentation

## 2019-05-05 DIAGNOSIS — E7800 Pure hypercholesterolemia, unspecified: Secondary | ICD-10-CM | POA: Insufficient documentation

## 2019-05-05 DIAGNOSIS — Z9012 Acquired absence of left breast and nipple: Secondary | ICD-10-CM | POA: Insufficient documentation

## 2019-05-05 DIAGNOSIS — R918 Other nonspecific abnormal finding of lung field: Secondary | ICD-10-CM | POA: Insufficient documentation

## 2019-05-05 DIAGNOSIS — I129 Hypertensive chronic kidney disease with stage 1 through stage 4 chronic kidney disease, or unspecified chronic kidney disease: Secondary | ICD-10-CM | POA: Insufficient documentation

## 2019-05-05 DIAGNOSIS — Z853 Personal history of malignant neoplasm of breast: Secondary | ICD-10-CM | POA: Insufficient documentation

## 2019-05-05 DIAGNOSIS — E039 Hypothyroidism, unspecified: Secondary | ICD-10-CM | POA: Insufficient documentation

## 2019-05-05 LAB — CMP (CANCER CENTER ONLY)
ALT: 15 U/L (ref 0–44)
AST: 19 U/L (ref 15–41)
Albumin: 3.9 g/dL (ref 3.5–5.0)
Alkaline Phosphatase: 69 U/L (ref 38–126)
Anion gap: 12 (ref 5–15)
BUN: 24 mg/dL — ABNORMAL HIGH (ref 8–23)
CO2: 26 mmol/L (ref 22–32)
Calcium: 9.4 mg/dL (ref 8.9–10.3)
Chloride: 101 mmol/L (ref 98–111)
Creatinine: 1.13 mg/dL — ABNORMAL HIGH (ref 0.44–1.00)
GFR, Est AFR Am: 54 mL/min — ABNORMAL LOW (ref >60)
GFR, Estimated: 46 mL/min — ABNORMAL LOW (ref >60)
Glucose, Bld: 101 mg/dL — ABNORMAL HIGH (ref 70–99)
Potassium: 3.4 mmol/L — ABNORMAL LOW (ref 3.5–5.1)
Sodium: 139 mmol/L (ref 135–145)
Total Bilirubin: 0.3 mg/dL (ref 0.3–1.2)
Total Protein: 6.6 g/dL (ref 6.5–8.1)

## 2019-05-05 LAB — CBC WITH DIFFERENTIAL (CANCER CENTER ONLY)
Abs Immature Granulocytes: 0.02 10*3/uL (ref 0.00–0.07)
Basophils Absolute: 0.1 10*3/uL (ref 0.0–0.1)
Basophils Relative: 1 %
Eosinophils Absolute: 0.1 10*3/uL (ref 0.0–0.5)
Eosinophils Relative: 3 %
HCT: 28.3 % — ABNORMAL LOW (ref 36.0–46.0)
Hemoglobin: 10 g/dL — ABNORMAL LOW (ref 12.0–15.0)
Immature Granulocytes: 0 %
Lymphocytes Relative: 30 %
Lymphs Abs: 1.5 10*3/uL (ref 0.7–4.0)
MCH: 34.1 pg — ABNORMAL HIGH (ref 26.0–34.0)
MCHC: 35.3 g/dL (ref 30.0–36.0)
MCV: 96.6 fL (ref 80.0–100.0)
Monocytes Absolute: 0.6 10*3/uL (ref 0.1–1.0)
Monocytes Relative: 12 %
Neutro Abs: 2.7 10*3/uL (ref 1.7–7.7)
Neutrophils Relative %: 54 %
Platelet Count: 157 10*3/uL (ref 150–400)
RBC: 2.93 MIL/uL — ABNORMAL LOW (ref 3.87–5.11)
RDW: 11.5 % (ref 11.5–15.5)
WBC Count: 5 10*3/uL (ref 4.0–10.5)
nRBC: 0 % (ref 0.0–0.2)

## 2019-05-05 LAB — MAGNESIUM: Magnesium: 0.9 mg/dL — CL (ref 1.7–2.4)

## 2019-05-05 MED ORDER — ACETAMINOPHEN 325 MG PO TABS
650.0000 mg | ORAL_TABLET | Freq: Once | ORAL | Status: AC
  Administered 2019-05-05: 650 mg via ORAL

## 2019-05-05 MED ORDER — DIPHENHYDRAMINE HCL 25 MG PO CAPS
25.0000 mg | ORAL_CAPSULE | Freq: Once | ORAL | Status: AC
  Administered 2019-05-05: 25 mg via ORAL

## 2019-05-05 MED ORDER — SODIUM CHLORIDE 0.9 % IV SOLN
6.0000 g | Freq: Once | INTRAVENOUS | Status: AC
  Administered 2019-05-05: 6 g via INTRAVENOUS
  Filled 2019-05-05: qty 12

## 2019-05-05 MED ORDER — SODIUM CHLORIDE 0.9% FLUSH
10.0000 mL | INTRAVENOUS | Status: DC | PRN
  Administered 2019-05-05: 10 mL
  Filled 2019-05-05: qty 10

## 2019-05-05 MED ORDER — DIPHENHYDRAMINE HCL 25 MG PO CAPS
ORAL_CAPSULE | ORAL | Status: AC
  Filled 2019-05-05: qty 1

## 2019-05-05 MED ORDER — HEPARIN SOD (PORK) LOCK FLUSH 100 UNIT/ML IV SOLN
500.0000 [IU] | Freq: Once | INTRAVENOUS | Status: AC | PRN
  Administered 2019-05-05: 500 [IU]
  Filled 2019-05-05: qty 5

## 2019-05-05 MED ORDER — ACETAMINOPHEN 325 MG PO TABS
ORAL_TABLET | ORAL | Status: AC
  Filled 2019-05-05: qty 2

## 2019-05-05 MED ORDER — SODIUM CHLORIDE 0.9% FLUSH
10.0000 mL | Freq: Once | INTRAVENOUS | Status: AC
  Administered 2019-05-05: 10 mL
  Filled 2019-05-05: qty 10

## 2019-05-05 MED ORDER — TRASTUZUMAB-DKST CHEMO 150 MG IV SOLR
6.0000 mg/kg | Freq: Once | INTRAVENOUS | Status: AC
  Administered 2019-05-05: 378 mg via INTRAVENOUS
  Filled 2019-05-05: qty 18

## 2019-05-05 MED ORDER — SODIUM CHLORIDE 0.9 % IV SOLN
Freq: Once | INTRAVENOUS | Status: AC
  Administered 2019-05-05: 10:00:00 via INTRAVENOUS
  Filled 2019-05-05: qty 250

## 2019-05-05 NOTE — Patient Instructions (Signed)

## 2019-05-05 NOTE — Patient Instructions (Addendum)
[facility] Discharge Instructions for Patients Receiving Chemotherapy  Today you received the following chemotherapy agents: trastuzumab.  To help prevent nausea and vomiting after your treatment, we encourage you to take your nausea medication as directed.   If you develop nausea and vomiting that is not controlled by your nausea medication, call the clinic.   BELOW ARE SYMPTOMS THAT SHOULD BE REPORTED IMMEDIATELY:  *FEVER GREATER THAN 100.5 F  *CHILLS WITH OR WITHOUT FEVER  NAUSEA AND VOMITING THAT IS NOT CONTROLLED WITH YOUR NAUSEA MEDICATION  *UNUSUAL SHORTNESS OF BREATH  *UNUSUAL BRUISING OR BLEEDING  TENDERNESS IN MOUTH AND THROAT WITH OR WITHOUT PRESENCE OF ULCERS  *URINARY PROBLEMS  *BOWEL PROBLEMS  UNUSUAL RASH Items with * indicate a potential emergency and should be followed up as soon as possible.  Feel free to call the clinic should you have any questions or concerns. The clinic phone number is [phone].  Please show the [facility] at check-in to the Emergency Department and triage nurse.  Hypomagnesemia  Hypomagnesemia is a condition in which the level of magnesium in the blood is low. Magnesium is a mineral that is found in many foods. It is used in many different processes in the body. Hypomagnesemia can affect every organ in the body. In severe cases, it can cause life-threatening problems. What are the causes? This condition may be caused by:  Not getting enough magnesium in your diet.  Malnutrition.  Problems with absorbing magnesium from the intestines.  Dehydration.  Alcohol abuse.  Vomiting.  Severe or chronic diarrhea.  Some medicines, including medicines that make you urinate more (diuretics).  Certain diseases, such as kidney disease, diabetes, celiac disease, and overactive thyroid. What are the signs or symptoms? Symptoms of this condition include:  Loss of appetite.  Nausea and  vomiting.  Involuntary shaking or trembling of a body part (tremor).  Muscle weakness.  Tingling in the arms and legs.  Sudden tightening of muscles (muscle spasms).  Confusion.  Psychiatric issues, such as depression, irritability, or psychosis.  A feeling of fluttering of the heart.  Seizures. These symptoms are more severe if magnesium levels drop suddenly. How is this diagnosed? This condition may be diagnosed based on:  Your symptoms and medical history.  A physical exam.  Blood and urine tests. How is this treated? Treatment depends on the cause and the severity of the condition. It may be treated with:  A magnesium supplement. This can be taken in pill form. If the condition is severe, magnesium is usually given through an IV.  Changes to your diet. You may be directed to eat foods that have a lot of magnesium, such as green leafy vegetables, peas, beans, and nuts.  Stopping any intake of alcohol. Follow these instructions at home:      Make sure that your diet includes foods with magnesium. Foods that have a lot of magnesium in them include: ? Green leafy vegetables, such as spinach and broccoli. ? Beans and peas. ? Nuts and seeds, such as almonds and sunflower seeds. ? Whole grains, such as whole grain bread and fortified cereals.  Take magnesium supplements if your health care provider tells you to do that. Take them as directed.  Take over-the-counter and prescription medicines only as told by your health care provider.  Have your magnesium levels monitored as told by your health care provider.  When you are active, drink fluids that contain electrolytes.  Avoid drinking alcohol.  Keep all follow-up  visits as told by your health care provider. This is important. Contact a health care provider if:  You get worse instead of better.  Your symptoms return. Get help right away if you:  Develop severe muscle weakness.  Have trouble  breathing.  Feel that your heart is racing. Summary  Hypomagnesemia is a condition in which the level of magnesium in the blood is low.  Hypomagnesemia can affect every organ in the body.  Treatment may include eating more foods that contain magnesium, taking magnesium supplements, and not drinking alcohol.  Have your magnesium levels monitored as told by your health care provider. This information is not intended to replace advice given to you by your health care provider. Make sure you discuss any questions you have with your health care provider. Document Released: 03/27/2005 Document Revised: 06/13/2017 Document Reviewed: 06/02/2017 Elsevier Patient Education  2020 [employer].

## 2019-05-06 ENCOUNTER — Telehealth: Payer: Self-pay | Admitting: Hematology and Oncology

## 2019-05-06 ENCOUNTER — Encounter: Payer: Self-pay | Admitting: Hematology and Oncology

## 2019-05-06 NOTE — Progress Notes (Signed)
[place] OFFICE PROGRESS NOTE  Patient Care Team: Burnard Bunting, [name]  ASSESSMENT & PLAN:  Endometrial cancer [facility]) Her recent echocardiogram is within normal limits She have no side effects from recent treatment so far Her blood counts are improving She will continue therapy/rehab for deconditioning We will continue treatment every 3 weeks She will be due for repeat imaging study at the end of the year  CKD (chronic kidney disease), stage III ([place]) Renal function is stable. Monitor closely  Hypomagnesemia She has severe hypomagnesemia  She will receive IV magnesium replacement therapy and oral replacement therapy This is likely residual side effects from prior treatment  Anemia in neoplastic disease She has multifactorial anemia, anemia secondary to prior chemotherapy as well as chronic kidney disease Anemia is improving/stable Recent vitamin B12 level was adequate She is not symptomatic Observe only   Orders Placed This Encounter  Procedures  . Magnesium    Standing Status:   Standing    Number of Occurrences:   8    Standing Expiration Date:   05/04/2020    INTERVAL HISTORY: Please see below for problem oriented charting. She returns for further follow-up She feels well She felt stronger with IV magnesium as well as oral magnesium replacement therapy She is doing well with physical therapy and rehab No side effects from treatment so far  SUMMARY OF ONCOLOGIC HISTORY: Oncology History Overview Note  ALLANNAH KEMPEN  has a remote history of left  breast cancer at age 55 but received BRCA testing approximately 5 years ago which was negative. Her cancer was treated with surgery but no radiation or chemotherapy.   Serous endometrial cancer, MSI stable ER positive, PR neg, Her2/neu 3+      Endometrial cancer ([place])  08/29/2016 Pathology Results   Endometrium, biopsy - HIGH GRADE ENDOMETRIAL CARCINOMA, SEE  COMMENT. Microscopic Comment The sections show multiple fragments of adenocarcinoma displaying glandular and papillary patterns associated with high grade cytomorphology characterized by nuclear pleomorphism, prominent nucleoli and brisk mitosis. Immunohistochemical stains show that the tumor cells are positive for vimentin, p16, p53 with increased Ki-67 expression. Estrogen and progesterone receptor stains show patchy weak positivity. No significant positivity is seen with CEA. The findings are consistent with high grade endometrial carcinoma and the overall morphology and phenotypic features favor serous carcinoma.   08/29/2016 Initial Diagnosis   She presented with postmenopausal bleeding   10/03/2016 Imaging   CT C/A/P 09/2016 IMPRESSION: 1. Thickening of the endometrial canal up to 19 mm in fundus, presumably corresponding to the patient's reported endometrial carcinoma. 2. Multiple tiny pulmonary nodules scattered throughout the lungs bilaterally measuring 4 mm or less in size. Nodules of this size are typically considered statistically likely benign. In the setting of known primary malignancy, metastatic disease to the lungs is not excluded, but is not strongly favored on today's examination. Attention on followup studies is recommended to ensure the stability or resolution of these nodules. 3. Subcentimeter low-attenuation lesion in the central aspect of segment 8 of the liver is too small to characterize. This is statistically likely a tiny cyst, but warrants attention on follow-up studies to exclude the possibility of a solitary hepatic metastasis. 4. 1.5 x 1.5 x 1.7 cm well-circumscribed lesion in the proximal stomach. This is of uncertain etiology and significance, and could represent a benign gastric polyp, however, further evaluation with nonemergent endoscopy is suggested in the near future for further evaluation. 5. **An incidental finding of potential  clinical significance has been found.  1.1 x 1.6 cm thyroid nodule in the inferior aspect of the right lobe of the thyroid gland. Follow-up evaluation with nonemergent thyroid ultrasound is recommended in the near future to better evaluate this finding. This recommendation follows ACR consensuss guidelines: Managing Incidental Thyroid Nodules Detected on Imaging: White Paper of the ACR Incidental Thyroid Findings Committee. J Am Coll Radiol 2015;12(2):143-150.** 6. Aortic atherosclerosis, in addition to left anterior descending coronary artery disease   10/22/2016 Surgery   Robotic assisted total hysterectomy, BSO and bilateral pelvic lymphadenectomy  Final pathology revealed a 3cm polyp containing serous carcinoma but with no myometrial invasion, no LVSI and negative nodes.  Stage IA Uterine serous cancer   10/22/2016 Pathology Results   1. Lymph nodes, regional resection, right pelvic - SIX BENIGN LYMPH NODES (0/6). 2. Lymph nodes, regional resection, left pelvic - SEVEN BENIGN LYMPH NODES (0/7). 3. Uterus +/- tubes/ovaries, neoplastic, with right ovary and fallopian tube ENDOMYOMETRIUM - SEROUS CARCINOMA ARISING WITHIN AN ENDOMETRIAL POLYP - NO MYOMETRIAL INVASION IDENTIFIED - ADENOMYOSIS - LEIOMYOMA (1 CM) - SEE ONCOLOGY TABLE AND COMMENT CERVIX - CARCINOMA FOCALLY INVOLVES ENDOCERVICAL GLANDS - NABOTHIAN CYSTS RIGHT ADNEXA - BENIGN OVARY AND FALLOPIAN TUBE - NO CARCINOMA IDENTIFIED 4. Cul-de-sac biopsy - MESOTHELIAL HYPERPLASIA Microscopic Comment 3. ONCOLOGY TABLE-UTERUS, CARCINOMA OR CARCINOSARCOMA Specimen: Uterus, right fallopian tube and ovary Procedure: Total hysterectomy and right salpingo-oophorectomy Lymph node sampling performed: Bilateral pelvic regional resection Specimen integrity: Intact Maximum tumor size: 3 cm (polyp) Histologic type: Serous carcinoma Grade: High grade Myometrial invasion: Not identified Cervical stromal involvement: No, focal endocervical gland involvement Extent of involvement  of other organs: Not identified Lymph - vascular invasion: Not identified Peritoneal washings: N/A Lymph nodes: Examined: 0 Sentinel 13 Non-sentinel 13 Total Lymph nodes with metastasis: 0 Isolated tumor cells (< 0.2 mm): 0 Micrometastasis: (> 0.2 mm and < 2.0 mm): 0 Macrometastasis: (> 2.0 mm): 0 Extracapsular extension: N/A Pelvic lymph nodes: 0 involved of 13 lymph nodes. Para-aortic lymph nodes: No para-aortic nodes submitted TNM code: pT1a, pNX FIGO Stage (based on pathologic findings, needs clinical correlation): IA Comment: Immunohistochemistry for cytokeratin AE1/AE3 is performed on all of the lymph nodes (parts 1 & 2) and no metastatic carcinoma is identified.   07/04/2017 Genetic Testing   The patient had genetic testing due to a personal history of breast and uterine cancer, and a family history of stomach cancer.  The Multi-Cancer Panel was ordered. The Multi-Cancer Panel offered by [facility] includes sequencing and/or deletion duplication testing of the following 83 genes: ALK, APC, ATM, AXIN2,BAP1,  BARD1, BLM, BMPR1A, BRCA1, BRCA2, BRIP1, CASR, CDC73, CDH1, CDK4, CDKN1B, CDKN1C, CDKN2A (p14ARF), CDKN2A (p16INK4a), CEBPA, CHEK2, CTNNA1, DICER1, DIS3L2, EGFR (c.2369C>T, p.Thr790Met variant only), EPCAM (Deletion/duplication testing only), FH, FLCN, GATA2, GPC3, GREM1 (Promoter region deletion/duplication testing only), HOXB13 (c.251G>A, p.Gly84Glu), HRAS, KIT, MAX, MEN1, MET, MITF (c.952G>A, p.Glu318Lys variant only), MLH1, MSH2, MSH3, MSH6, MUTYH, NBN, NF1, NF2, NTHL1, PALB2, PDGFRA, PHOX2B, PMS2, POLD1, POLE, POT1, PRKAR1A, PTCH1, PTEN, RAD50, RAD51C, RAD51D, RB1, RECQL4, RET, RUNX1, SDHAF2, SDHA (sequence changes only), SDHB, SDHC, SDHD, SMAD4, SMARCA4, SMARCB1, SMARCE1, STK11, SUFU, TERC, TERT, TMEM127, TP53, TSC1, TSC2, VHL, WRN and WT1.   Results: No pathogenic variants were identified.  A variant of uncertain significance in the gene APC was identified.  c.791A>G (p.Gln264Arg).   The date of this test report is 07/04/2017.     Genetic Testing   Patient has genetic testing done for MSI. Results revealed patient is MSI stable on  surgical pathology from 10/22/2016.    12/03/2017 Imaging   CT Chest/Abd/Pelvis to follow pulmonary nodule and gastric mass IMPRESSION: 1. Stable CT of the chest. Small pulmonary nodules are unchanged when compared with previous exam. 2. No new findings identified. 3. Subcentimeter low-attenuation lesions within the liver are remain too small to characterize but are stable from prior exam. 4. Persistent indeterminate low-attenuation structure within the proximal stomach is unchanged measuring 1.4 cm. Correlation with direct visualization is advised   06/30/2018 Imaging   CT CHEST Lungs/Pleura: Stable scattered sub-cm pulmonary nodules are again seen bilaterally and are stable compared to previous studies. No new or enlarging pulmonary nodules or masses identified. No evidence of pulmonary infiltrate or pleural effusion.   08/19/2018 Echocardiogram   ECHO is done in [place]: EF 60-65%. Mild impaired relaxation. (report scanned)   09/17/2018 Relapse/Recurrence   Presented with c/o vagina discharge.  Lesion noted at the left vaginal apex 68m lesion removed Path c/w high grade serous cancer   09/17/2018 Pathology Results   Vagina, biopsy, left apex - HIGH GRADE SEROUS CARCINOMA.   09/28/2018 PET scan   1. Two hypermetabolic axial lymph nodes. Unusual site for metastatic endometrial carcinoma however the activity is more intense than typically seen in reactive adenopathy. Suggest ultrasound-guided percutaneous biopsy of the larger RIGHT axial lymph node 2. No evidence of local recurrence at the vaginal cuff.  3. No metastatic adenopathy in the abdomen or pelvis. 4. Stable small pulmonary nodules   10/09/2018 Pathology Results   Lymph node, needle/core biopsy, right axilla - METASTATIC CARCINOMA, SEE COMMENT. Microscopic Comment The carcinoma  appears high grade. Immunohistochemistry is positive for cytokeratin 7, PAX-8, and ER. Cytokeratin 20, CDX-2, PR, and GATA-3 are negative. The findings along with the history are consistent with a gynecologic primary.    10/09/2018 Procedure   Ultrasound-guided core biopsies of a right axillary lymph node.   10/14/2018 Cancer Staging   Staging form: Corpus Uteri - Carcinoma and Carcinosarcoma, AJCC 8th Edition - Clinical: Stage IVB (cT1, cN0, pM1) - Signed by GHeath Lark MD on 10/14/2018   10/19/2018 Imaging   Placement of single lumen port a cath via right internal jugular vein. The catheter tip lies at the cavo-atrial junction. A power injectable port a cath was placed and is ready for immediate use.    10/21/2018 -  Chemotherapy   The patient had carboplatin and taxol for treatment   11/11/2018 -  Chemotherapy   The patient had trastuzumab ([name]) 450 mg in sodium chloride 0.9 % 250 mL chemo infusion, 483 mg, Intravenous,  Once, 6 of 6 cycles Administration: 450 mg (11/11/2018), 378 mg (12/02/2018), 378 mg (02/03/2019), 378 mg (12/23/2018), 378 mg (01/13/2019), 378 mg (02/24/2019) trastuzumab-dkst (OGIVRI) 378 mg in sodium chloride 0.9 % 250 mL chemo infusion, 6 mg/kg = 378 mg (100 % of original dose 6 mg/kg), Intravenous,  Once, 3 of 6 cycles Dose modification: 6 mg/kg (original dose 6 mg/kg, Cycle 7, Reason: Other (see comments)) Administration: 378 mg (03/24/2019), 378 mg (04/14/2019), 378 mg (05/05/2019)  for chemotherapy treatment.    12/21/2018 PET scan   1. Interval resolution of hypermetabolic right axillary lymph nodes. No metabolic findings highly suspicious for recurrent metastatic disease. 2. New mild hypermetabolism within a borderline prominent portacaval lymph node, nonspecific. While a reactive node is favored, a lymph node metastasis cannot be entirely excluded. Suggest attention to this lymph node on follow-up PET-CT in 3-6 months. 3. Nonspecific new hypermetabolism at the ileocecal  valve, more likely  physiologic given absence of CT correlate. 4. Scattered subcentimeter pulmonary nodules are all stable and below PET resolution, more likely benign, continued CT surveillance advised. 5. Chronic findings include: Aortic Atherosclerosis (ICD10-I70.0). Marked diffuse colonic diverticulosis. Coronary atherosclerosis.    12/28/2018 Echocardiogram   1. The left ventricle has normal systolic function with an ejection fraction of 60-65%. The cavity size was normal. Left ventricular diastolic Doppler parameters are consistent with impaired relaxation.  2. GLS recorded as -10.7 but LV appears hyperdynamic and tracking of endocardium appears poor.  3. The right ventricle has normal systolic function. The cavity was normal. There is no increase in right ventricular wall thickness.  4. Mild thickening of the mitral valve leaflet.  5. The aortic valve was not well visualized. Mild thickening of the aortic valve. Aortic valve regurgitation is trivial by color flow Doppler.   03/24/2019 PET scan   1. No findings of hypermetabolic residual/recurrent or metastatic disease. 2. Similar low-level hypermetabolism within normal sized portocaval and right inguinal nodes, favored to be reactive. 3. Ongoing stability of small bilateral pulmonary nodules, favored to be benign. Below PET resolution. 4. Coronary artery atherosclerosis. Aortic Atherosclerosis (ICD10-I70.0).   03/26/2019 Echocardiogram   1. The left ventricle has normal systolic function, with an ejection fraction of 55-60%. The cavity size was normal. Left ventricular diastolic Doppler parameters are consistent with impaired relaxation.  2. The right ventricle has normal systolic function. The cavity was normal.  3. The mitral valve is abnormal. Mild thickening of the mitral valve leaflet. There is mild mitral annular calcification present.  4. The tricuspid valve is grossly normal.  5. The aortic valve is tricuspid. Mild calcification of  the aortic valve. No stenosis of the aortic valve.  6. The aorta is normal unless otherwise noted.  7. Normal LV systolic function; grade 1 diastolic dysfunction; AYT-01.6%.   Metastasis to lymph nodes ([place])  10/14/2018 Initial Diagnosis   Metastasis to lymph nodes ([place])   10/21/2018 - 03/16/2019 Chemotherapy   The patient had palonosetron (ALOXI) injection 0.25 mg, 0.25 mg, Intravenous,  Once, 7 of 7 cycles Administration: 0.25 mg (10/21/2018), 0.25 mg (11/11/2018), 0.25 mg (12/02/2018), 0.25 mg (12/23/2018), 0.25 mg (01/13/2019), 0.25 mg (02/03/2019), 0.25 mg (02/24/2019) CARBOplatin (PARAPLATIN) 310 mg in sodium chloride 0.9 % 250 mL chemo infusion, 310 mg, Intravenous,  Once, 7 of 7 cycles Dose modification: 300 mg (original dose 311.5 mg, Cycle 4, Reason: Dose not tolerated) Administration: 310 mg (10/21/2018), 300 mg (11/11/2018), 300 mg (12/02/2018), 300 mg (12/23/2018), 300 mg (01/13/2019), 300 mg (02/03/2019), 300 mg (02/24/2019) PACLitaxel (TAXOL) 222 mg in sodium chloride 0.9 % 250 mL chemo infusion (> 68m/m2), 135 mg/m2 = 222 mg, Intravenous,  Once, 7 of 7 cycles Administration: 222 mg (10/21/2018), 222 mg (11/11/2018), 222 mg (12/02/2018), 222 mg (12/23/2018), 222 mg (01/13/2019), 222 mg (02/03/2019), 222 mg (02/24/2019) fosaprepitant (EMEND) 150 mg, dexamethasone (DECADRON) 12 mg in sodium chloride 0.9 % 145 mL IVPB, , Intravenous,  Once, 7 of 7 cycles Administration:  (10/21/2018),  (11/11/2018),  (12/02/2018),  (12/23/2018),  (01/13/2019),  (02/03/2019),  (02/24/2019)  for chemotherapy treatment.      REVIEW OF SYSTEMS:   Constitutional: Denies fevers, chills or abnormal weight loss Eyes: Denies blurriness of vision Ears, nose, mouth, throat, and face: Denies mucositis or sore throat Respiratory: Denies cough, dyspnea or wheezes Cardiovascular: Denies palpitation, chest discomfort or lower extremity swelling Gastrointestinal:  Denies nausea, heartburn or change in bowel habits Skin: Denies abnormal skin  rashes Lymphatics: Denies new lymphadenopathy  or easy bruising Neurological:Denies numbness, tingling or new weaknesses Behavioral/Psych: Mood is stable, no new changes  All other systems were reviewed with the patient and are negative.  I have reviewed the past medical history, past surgical history, social history and family history with the patient and they are unchanged from previous note.  ALLERGIES:  has No Known Allergies.  MEDICATIONS:  Current Outpatient Medications  Medication Sig Dispense Refill  . acetaminophen (TYLENOL) 500 MG tablet Take 1,000 mg by mouth every 6 (six) hours as needed.    Marland Kitchen aspirin EC 81 MG tablet Take 81 mg by mouth daily.    Marland Kitchen atorvastatin (LIPITOR) 20 MG tablet Take 20 mg by mouth every evening.     . Calcium Carbonate-Vitamin D (CALCIUM-VITAMIN D) 500-200 MG-UNIT per tablet Take 1 tablet by mouth daily.    . citalopram (CELEXA) 20 MG tablet Take 20 mg by mouth at bedtime.     Marland Kitchen levothyroxine (SYNTHROID, LEVOTHROID) 100 MCG tablet Take 100 mcg by mouth daily before breakfast.     . lidocaine-prilocaine (EMLA) cream Apply to affected area once 30 g 3  . losartan-hydrochlorothiazide (HYZAAR) 100-25 MG tablet     . magnesium oxide (MAG-OX) 400 (241.3 Mg) MG tablet Take 1 tablet (400 mg total) by mouth daily. 30 tablet 11  . metoprolol succinate (TOPROL-XL) 25 MG 24 hr tablet Take 25 mg by mouth daily.    . Multiple Vitamin (MULTIVITAMIN WITH MINERALS) TABS Take 1 tablet by mouth daily.    . ondansetron (ZOFRAN) 8 MG tablet Take 1 tablet (8 mg total) by mouth every 8 (eight) hours as needed. Start on the third day after chemotherapy. 30 tablet 1  . oxyCODONE (OXY IR/ROXICODONE) 5 MG immediate release tablet Take 1 tablet (5 mg total) by mouth every 6 (six) hours as needed for severe pain. (Patient not taking: Reported on 04/05/2019) 30 tablet 0  . pantoprazole (PROTONIX) 40 MG tablet Take 40 mg by mouth every morning.     . prochlorperazine (COMPAZINE) 10 MG  tablet Take 1 tablet (10 mg total) by mouth every 6 (six) hours as needed (Nausea or vomiting). (Patient not taking: Reported on 04/05/2019) 30 tablet 1  . valACYclovir (VALTREX) 1000 MG tablet valacyclovir 1 gram tablet     No current facility-administered medications for this visit.     PHYSICAL EXAMINATION: ECOG PERFORMANCE STATUS: 1 - Symptomatic but completely ambulatory  Vitals:   05/05/19 1002  BP: (!) 149/72  Pulse: 72  Resp: 17  Temp: 98 F (36.7 C)  SpO2: 98%   Filed Weights   05/05/19 1002  Weight: 133 lb 11.2 oz (60.6 kg)    GENERAL:alert, no distress and comfortable SKIN: skin color, texture, turgor are normal, no rashes or significant lesions EYES: normal, Conjunctiva are pink and non-injected, sclera clear OROPHARYNX:no exudate, no erythema and lips, buccal mucosa, and tongue normal  NECK: supple, thyroid normal size, non-tender, without nodularity LYMPH:  no palpable lymphadenopathy in the cervical, axillary or inguinal LUNGS: clear to auscultation and percussion with normal breathing effort HEART: regular rate & rhythm and no murmurs and no lower extremity edema ABDOMEN:abdomen soft, non-tender and normal bowel sounds Musculoskeletal:no cyanosis of digits and no clubbing  NEURO: alert & oriented x 3 with fluent speech, no focal motor/sensory deficits  LABORATORY DATA:  I have reviewed the data as listed    Component Value Date/Time   NA 139 05/05/2019 0952   NA 139 02/13/2017 1512   K 3.4 (L) 05/05/2019  0952   K 4.2 02/13/2017 1512   CL 101 05/05/2019 0952   CO2 26 05/05/2019 0952   CO2 28 02/13/2017 1512   GLUCOSE 101 (H) 05/05/2019 0952   GLUCOSE 118 02/13/2017 1512   BUN 24 (H) 05/05/2019 0952   BUN 25.2 02/13/2017 1512   CREATININE 1.13 (H) 05/05/2019 0952   CREATININE 1.2 (H) 02/13/2017 1512   CALCIUM 9.4 05/05/2019 0952   CALCIUM 10.0 02/13/2017 1512   PROT 6.6 05/05/2019 0952   ALBUMIN 3.9 05/05/2019 0952   AST 19 05/05/2019 0952   ALT  15 05/05/2019 0952   ALKPHOS 69 05/05/2019 0952   BILITOT 0.3 05/05/2019 0952   GFRNONAA 46 (L) 05/05/2019 0952   GFRAA 54 (L) 05/05/2019 0952    No results found for: SPEP, UPEP  Lab Results  Component Value Date   WBC 5.0 05/05/2019   NEUTROABS 2.7 05/05/2019   HGB 10.0 (L) 05/05/2019   HCT 28.3 (L) 05/05/2019   MCV 96.6 05/05/2019   PLT 157 05/05/2019      Chemistry      Component Value Date/Time   NA 139 05/05/2019 0952   NA 139 02/13/2017 1512   K 3.4 (L) 05/05/2019 0952   K 4.2 02/13/2017 1512   CL 101 05/05/2019 0952   CO2 26 05/05/2019 0952   CO2 28 02/13/2017 1512   BUN 24 (H) 05/05/2019 0952   BUN 25.2 02/13/2017 1512   CREATININE 1.13 (H) 05/05/2019 0952   CREATININE 1.2 (H) 02/13/2017 1512      Component Value Date/Time   CALCIUM 9.4 05/05/2019 0952   CALCIUM 10.0 02/13/2017 1512   ALKPHOS 69 05/05/2019 0952   AST 19 05/05/2019 0952   ALT 15 05/05/2019 0952   BILITOT 0.3 05/05/2019 0952       All questions were answered. The patient knows to call the clinic with any problems, questions or concerns. No barriers to learning was detected.  I spent 15 minutes counseling the patient face to face. The total time spent in the appointment was 20 minutes and more than 50% was on counseling and review of test results  [name], MD 05/06/2019 8:17 AM

## 2019-05-06 NOTE — Assessment & Plan Note (Signed)
Renal function is stable. Monitor closely 

## 2019-05-06 NOTE — Assessment & Plan Note (Signed)
She has severe hypomagnesemia  She will receive IV magnesium replacement therapy and oral replacement therapy This is likely residual side effects from prior treatment

## 2019-05-06 NOTE — Assessment & Plan Note (Signed)
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 

## 2019-05-06 NOTE — Telephone Encounter (Signed)
Scheduled appt per 10/21 sch message - called pt . Unable to reach pt . Left message with appt date and times. Mailed reminder letter

## 2019-05-06 NOTE — Assessment & Plan Note (Signed)
Her recent echocardiogram is within normal limits She have no side effects from recent treatment so far Her blood counts are improving She will continue therapy/rehab for deconditioning We will continue treatment every 3 weeks She will be due for repeat imaging study at the end of the year

## 2019-05-07 ENCOUNTER — Other Ambulatory Visit: Payer: Self-pay

## 2019-05-07 ENCOUNTER — Ambulatory Visit: Payer: Medicare HMO

## 2019-05-07 DIAGNOSIS — C541 Malignant neoplasm of endometrium: Secondary | ICD-10-CM

## 2019-05-07 DIAGNOSIS — M6281 Muscle weakness (generalized): Secondary | ICD-10-CM | POA: Diagnosis not present

## 2019-05-07 DIAGNOSIS — R5381 Other malaise: Secondary | ICD-10-CM | POA: Diagnosis not present

## 2019-05-07 DIAGNOSIS — R2689 Other abnormalities of gait and mobility: Secondary | ICD-10-CM

## 2019-05-07 NOTE — Therapy (Signed)
[place], [place], [address] Phone: [phone]   Fax:  [phone]  Physical Therapy Treatment  Patient Details  Name: Vicki Cervantes MRN: [id number] Date of Birth: [birth year] Referring Provider (PT): [name]   Encounter Date: 05/07/2019  PT End of Session - 05/07/19 1109    Visit Number  7    Number of Visits  17    Date for PT Re-Evaluation  06/01/19    PT Start Time  1105    PT Stop Time  1158    PT Time Calculation (min)  53 min    Activity Tolerance  Patient tolerated treatment well    Behavior During Therapy  [facility] for tasks assessed/performed       Past Medical History:  Diagnosis Date  . Aortic atherosclerosis ([place])   . Arthritis   . Blood transfusion without reported diagnosis   . Breast cancer ([place])     LEFT, '82-left breast cancer-surgery only  . Cataract    removed both eyes  . Complication of anesthesia    nausea  . Diverticulosis   . Esophageal stricture   . Family history of stomach cancer   . GERD (gastroesophageal reflux disease)   . Hemorrhoids   . Hiatal hernia   . High triglycerides   . Hypercholesteremia   . Hypertension   . Hypothyroidism   . Osteoarthritis   . PONV (postoperative nausea and vomiting)   . Uterine cancer [facility])     Past Surgical History:  Procedure Laterality Date  . COLONOSCOPY    . EUS N/A 01/07/2013   Procedure: UPPER ENDOSCOPIC ULTRASOUND (EUS) LINEAR;  Surgeon: Milus Banister, MD;  [facility] ENDOS[facility];  Service: Endoscopy;  Laterality: N/A;  . EUS N/A 01/05/2015   Procedure: UPPER ENDOSCOPIC ULTRASOUND (EUS) LINEAR;  Surgeon: Milus Banister, MD;  Location[facility] ENDOS[facility];  Service: Endoscopy;  Laterality: N/A;  . FOOT SURGERY Left 2009-2010   x2. Surgery in 2011  . IR IMAGING GUIDED PORT INSERTION  10/19/2018  . LEFT HEART CATHETERIZATION WITH CORONARY ANGIOGRAM N/A 01/24/2014   Procedure: LEFT HEART CATHETERIZATION WITH CORONARY  ANGIOGRAM;  Surgeon: Peter M Martinique, MD;  Location: [facility] CATH LAB;  Service: Cardiovascular;  Laterality: N/A;  . LUMBAR [place] SURGERY  01-01-13  . MASTECTOMY, RADICAL Left 1982  . OVARY SURGERY  1972-1973  . ROBOTIC ASSISTED TOTAL HYSTERECTOMY WITH BILATERAL SALPINGO OOPHERECTOMY N/A 10/22/2016   Procedure: XI ROBOTIC ASSISTED TOTAL HYSTERECTOMY WITH RIGHT SALPINGO OOPHORECTOMY; INJECTION OF GREEN DYE WITH EXCISION OF BILATERAL PELVIC LYMPH NODES;  Surgeon: Janie Morning, MD;  [facility]: [facility] ORS;  Service: Gynecology;  Laterality: N/A;  . ROTATOR CUFF REPAIR Left   . SENTINEL NODE BIOPSY N/A 10/22/2016   Procedure: SENTINEL NODE BIOPSY;  Surgeon: Janie Morning, MD;  Location: [facility] ORS;  Service: Gynecology;  Laterality: N/A;  . SIMPLE MASTECTOMY Left 1982  . TOTAL KNEE ARTHROPLASTY Bilateral 2009-2010   x2  . UPPER GASTROINTESTINAL ENDOSCOPY      There were no vitals filed for this visit.  Subjective Assessment - 05/07/19 1106    Subjective  Pt states that she was not doing the exercises in supine since we only had one day between coming to physical therapy. She states that overall she feels pretty.    Pertinent History  Pt has a history of L radical mastectomy when she was 41 and R simple mastectomy. No previous signs of edema in her B arms. Pt had a  hysterectomy 2 years ago due to endometrial cancer which returned this year.    Currently in Pain?  No/denies                       [facility] Adult PT Treatment/Exercise - 05/07/19 0001      Neuro Re-ed    Neuro Re-ed Details   Stepping up onto BOSU w/3 second hold unilateral Ue suppoert and SBA-CGA 10x Bil, Sit to stand from standard chair height 10x red theraband at the knees for tactile input in order to decrease knee valgus on sitting, Flat side up BOSU lateral wgt shifts/fwd/back wgt shifts 20x each direction with SBA/CGA and no UE support demonstrating improvement from her last session. Squats on flat side BOSU w/o UE support, CGA  provided and VC to keep knees apart as well as decrease anterior tibial progression. Stepping up onto BOSU with 3 second hold unilateral intermittent UE support and CGA 10x Bil, VOR tracking in vertical with ball bounce while walking for 2 laps in the hallway followed by ball throwing while walking for 2 laps in the hallways. VC occasionally for tracking pt had difficulty looking down at her hands when the ball was in them. Close supervision/SBA pt tends to reach for ball outside BOS and needs reminder to let that go.        Knee/Hip Exercises: Standing   Lateral Step Up  Right;Left;1 set;10 reps    Lateral Step Up Limitations  VC and demonstration to avoid knee valgus, unilateral UE support.     Other Standing Knee Exercises  Gastroc stretch 2x 30 seconds Bil on 4" step .       Knee/Hip Exercises: Supine   Bridges  Both;Strengthening;2 sets;15 reps    Bridges Limitations  heel closer to glutes with greater ROM than previously VC for keeping knees/feet apart to adequately activate glutes 10x w/feet hip width apart and 10x with feet sider for greater glute activation.     Straight Leg Raises  Strengthening;10 reps    Straight Leg Raises Limitations  Pt demonstrates good form throughout.     Straight Leg Raise with External Rotation  Strengthening;Right;Left;10 reps    Straight Leg Raise with External Rotation Limitations  VC for correct movement including pointing toe out.       Knee/Hip Exercises: Sidelying   Hip ABduction  Strengthening;10 reps;Both;2 sets    Hip ABduction Limitations  2 sets Bil. decreased compensation this session light tactlie cueing at pelvis.              PT Education - 05/07/19 1157    Education Details  Pt will continue with current HEP. Pt was educated to not performing the walking/throwing/bouncing activities together due to risk for falls.    Person(s) Educated  Patient    Methods  Explanation    Comprehension  Verbalized understanding       PT Short  Term Goals - 05/04/19 1108      PT SHORT TERM GOAL #1   Title  Pt will be able to state precautions and risk factors for lymphedema within 4 weeks in order to decrease risk for lymphedema in the future related to lymphnode removal and surgery.    Baseline  Pt remembers what lymphedema is and is able to ask if leggings are an appropriate clothing for lymphedema prevention    Time  4    Period  Weeks    Status  On-going    Target Date  05/10/19        PT Long Term Goals - 05/04/19 1110      PT LONG TERM GOAL #1   Title  Pt will perform 52/56 on the BERG balance test in order to demonstrate a decrease risk for falls.    Baseline  50/56    Time  4    Period  Weeks    Status  On-going    Target Date  06/08/19      PT LONG TERM GOAL #2   Title  The patient will maintain single limb stance x 10 seconds bilaterally.    Baseline  RLE 7 seconds, LLE 2 seconds with multiple attempts    Time  4    Period  Weeks    Status  On-going    Target Date  06/08/19      PT LONG TERM GOAL #3   Title  The patient will return demo HEP for core strength, L hip stretching/strengthening and high level balance.    Baseline  Pt is working on HEP at home.    Time  4    Period  Weeks    Status  On-going    Target Date  06/08/19            Plan - 05/07/19 1107    Clinical Impression Statement  Pt presents to physical therapy this session continuing to improve. She was able to tolerate dynamic VOR activities this session w/throwing/catching ball during ambulation. Close SBA/CGA and education to just let the random balls fall to prevent falls from reaching for objects outside BOS. Discussed how this is also important at home if objects fall. Increased strengthening was performing this session and she was able to tolerate sit<>stand from standard height w/o UE support. Theraband was used as tactile input to decrease knee valgus with good results. Pt will benefit from continued POC.    Personal Factors  and Comorbidities  Comorbidity 3+    Comorbidities  CKD stage III, hx breast cancer, endometrial cancer    Examination-Activity Limitations  Other;Locomotion Level    Stability/Clinical Decision Making  Stable/Uncomplicated    Rehab Potential  Good    PT Frequency  2x / week    PT Duration  4 weeks    PT Treatment/Interventions  ADLs/Self Care Home Management;Electrical Stimulation;Gait training;Stair training;Functional mobility training;Therapeutic activities;Neuromuscular re-education;Balance training;Therapeutic exercise;Patient/family education;Manual techniques;Manual lymph drainage;Compression bandaging;Passive range of motion;Taping;Vasopneumatic Device;Joint Manipulations    PT Next Visit Plan  Continue with VOR and balance activities progress these and strengthening as needed. Next session update HEP    PT Home Exercise Plan  Access Code: [email or web address]    Consulted and Agree with Plan of Care  Patient       Patient will benefit from skilled therapeutic intervention in order to improve the following deficits and impairments:  Abnormal gait, Decreased balance, Decreased endurance, Dizziness, Decreased activity tolerance, Improper body mechanics  Visit Diagnosis: Endometrial cancer ([place])  Muscle weakness (generalized)  Other abnormalities of gait and mobility  Physical debility     Problem List Patient Active Problem List   Diagnosis Date Noted  . Deficiency anemia 03/24/2019  . Hypokalemia due to inadequate potassium intake 03/24/2019  . Hypomagnesemia 03/24/2019  . Physical debility 03/24/2019  . Pancytopenia, acquired ([place]) 02/03/2019  . 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 Bone pain 11/10/2018  . Encounter for antineoplastic chemotherapy 10/20/2018  . 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 Goals of care, counseling/discussion 10/14/2018  .  CKD (chronic kidney disease), stage III 10/14/2018  . Genetic testing 07/14/2017  . History of breast cancer  06/25/2017  . Family history of stomach cancer   . Vaginal mass 02/13/2017  . Thyroid nodule 02/13/2017  . Pulmonary nodules/lesions, multiple 02/13/2017  . Endometrial cancer ([place]) 09/30/2016  . HTN (hypertension) 01/17/2014  . Chest pain at rest 01/17/2014  . Hyperlipidemia 04/03/2011  . OTHER SPECIFIED DISORDER OF STOMACH AND DUODENUM 10/19/2009    Ander Purpura, PT 05/07/2019, 12:01 PM  [place], [place], [address] Phone: [phone]   Fax:  [phone]  Name: Vicki Cervantes MRN: [id number] Date of Birth: [birth year]

## 2019-05-11 ENCOUNTER — Ambulatory Visit: Payer: Medicare HMO

## 2019-05-11 ENCOUNTER — Other Ambulatory Visit: Payer: Self-pay

## 2019-05-11 DIAGNOSIS — R5381 Other malaise: Secondary | ICD-10-CM | POA: Diagnosis not present

## 2019-05-11 DIAGNOSIS — C541 Malignant neoplasm of endometrium: Secondary | ICD-10-CM

## 2019-05-11 DIAGNOSIS — R2689 Other abnormalities of gait and mobility: Secondary | ICD-10-CM

## 2019-05-11 DIAGNOSIS — M6281 Muscle weakness (generalized): Secondary | ICD-10-CM | POA: Diagnosis not present

## 2019-05-11 NOTE — Therapy (Signed)
[place], [place], [address] Phone: [phone]   Fax:  [phone]  Physical Therapy Treatment  Patient Details  Name: Vicki Cervantes MRN: [id number] Date of Birth: 10/21/1940 Referring Provider (PT): [name]   Encounter Date: 05/11/2019  PT End of Session - 05/11/19 1103    Visit Number  8    Number of Visits  17    Date for PT Re-Evaluation  06/01/19    PT Start Time  1100    PT Stop Time  1153    PT Time Calculation (min)  53 min    Activity Tolerance  Patient tolerated treatment well    Behavior During Therapy  [facility] for tasks assessed/performed       Past Medical History:  Diagnosis Date  . Aortic atherosclerosis ([place])   . Arthritis   . Blood transfusion without reported diagnosis   . Breast cancer ([place])     LEFT, '82-left breast cancer-surgery only  . Cataract    removed both eyes  . Complication of anesthesia    nausea  . Diverticulosis   . Esophageal stricture   . Family history of stomach cancer   . GERD (gastroesophageal reflux disease)   . Hemorrhoids   . Hiatal hernia   . High triglycerides   . Hypercholesteremia   . Hypertension   . Hypothyroidism   . Osteoarthritis   . PONV (postoperative nausea and vomiting)   . Uterine cancer [facility])     Past Surgical History:  Procedure Laterality Date  . COLONOSCOPY    . EUS N/A 01/07/2013   Procedure: UPPER ENDOSCOPIC ULTRASOUND (EUS) LINEAR;  Surgeon: Milus Banister, MD;  [facility];  Service: Endoscopy;  Laterality: N/A;  . EUS N/A 01/05/2015   Procedure: UPPER ENDOSCOPIC ULTRASOUND (EUS) LINEAR;  Surgeon: Milus Banister, MD;  [facility];  Service: Endoscopy;  Laterality: N/A;  . FOOT SURGERY Left 2009-2010   x2. Surgery in 2011  . IR IMAGING GUIDED PORT INSERTION  10/19/2018  . LEFT HEART CATHETERIZATION WITH CORONARY ANGIOGRAM N/A 01/24/2014   Procedure: LEFT HEART CATHETERIZATION WITH CORONARY  ANGIOGRAM;  Surgeon: Peter M Martinique, MD;  Location: [facility] CATH LAB;  Service: Cardiovascular;  Laterality: N/A;  . LUMBAR [place] SURGERY  01-01-13  . MASTECTOMY, RADICAL Left 1982  . OVARY SURGERY  1972-1973  . ROBOTIC ASSISTED TOTAL HYSTERECTOMY WITH BILATERAL SALPINGO OOPHERECTOMY N/A 10/22/2016   Procedure: XI ROBOTIC ASSISTED TOTAL HYSTERECTOMY WITH RIGHT SALPINGO OOPHORECTOMY; INJECTION OF GREEN DYE WITH EXCISION OF BILATERAL PELVIC LYMPH NODES;  Surgeon: Janie Morning, MD;  [facility];  Service: Gynecology;  Laterality: N/A;  . ROTATOR CUFF REPAIR Left   . SENTINEL NODE BIOPSY N/A 10/22/2016   Procedure: SENTINEL NODE BIOPSY;  Surgeon: Janie Morning, MD;  [facility];  Service: Gynecology;  Laterality: N/A;  . SIMPLE MASTECTOMY Left 1982  . TOTAL KNEE ARTHROPLASTY Bilateral 2009-2010   x2  . UPPER GASTROINTESTINAL ENDOSCOPY      There were no vitals filed for this visit.  Subjective Assessment - 05/11/19 1103    Subjective  Pt states that she didn't do as many exercises this weekend but she did do some. She states she was a little tired after her last session but overall felt fine.    Pertinent History  Pt has a history of L radical mastectomy when she was 41 and R simple mastectomy. No previous signs of edema in her B arms. Pt  had a hysterectomy 2 years ago due to endometrial cancer which returned this year.    Currently in Pain?  No/denies                       [facility] Adult PT Treatment/Exercise - 05/11/19 0001      Neuro Re-ed    Neuro Re-ed Details   3x20 seconds tandem standing w/intermittent UE support Bil. SLS Bil 5x 10 seconds w/intermittent UE support using raised plinth table for balance. sit to stand 10x onto airex with close SBA, balance R/L in frontal plane and fwd/back in the saggital plane 20x each on flat side of BOSU working on ankle/hip strategy. 10x squat on flat side BOSU w/intermittent UE support and close SBA. Step up into SLS for 1-2 seconds  on rounded side of BOSU 10x Bil with CGA 1x LOB w/Min support to maintain upright stance. 2 laps each of ambulating with head horizontal head turns, vertical head turns and retro gait w/close SBA and VC for slowing or speeding depending on feelings of stability. Discussed if she feels dizzy to let symptoms decrease before starting the next lap.       Knee/Hip Exercises: Standing   Other Standing Knee Exercises  Sit to stand 10x w/band at knees demonstration and tactile cueing for R foot placement for correct form.              PT Education - 05/11/19 1111    Education Details  Access Code: [email or web address], updated HEP discussed getting good at tandem stance before progressing to tandem gait. pt was educated on exericse programs following her discharge from physical therapy including discussed [employer] program and [employer], [employer] (consulting).    Person(s) Educated  Patient    Methods  Explanation;Demonstration;Verbal cues    Comprehension  Verbalized understanding;Returned demonstration       PT Short Term Goals - 05/04/19 1108      PT SHORT TERM GOAL #1   Title  Pt will be able to state precautions and risk factors for lymphedema within 4 weeks in order to decrease risk for lymphedema in the future related to lymphnode removal and surgery.    Baseline  Pt remembers what lymphedema is and is able to ask if leggings are an appropriate clothing for lymphedema prevention    Time  4    Period  Weeks    Status  On-going    Target Date  05/10/19        PT Long Term Goals - 05/04/19 1110      PT LONG TERM GOAL #1   Title  Pt will perform 52/56 on the BERG balance test in order to demonstrate a decrease risk for falls.    Baseline  50/56    Time  4    Period  Weeks    Status  On-going    Target Date  06/08/19      PT LONG TERM GOAL #2   Title  The patient will maintain single limb stance x 10 seconds bilaterally.    Baseline  RLE 7 seconds, LLE 2 seconds with multiple attempts    Time  4     Period  Weeks    Status  On-going    Target Date  06/08/19      PT LONG TERM GOAL #3   Title  The patient will return demo HEP for core strength, L hip stretching/strengthening and high level balance.    Baseline  Pt  is working on HEP at home.    Time  4    Period  Weeks    Status  On-going    Target Date  06/08/19            Plan - 05/11/19 1103    Clinical Impression Statement  Pt presents to physicla therapy continuing to improve with balance deficits as demonstrated by ability to tolerate progression of HEP this session w/dynamic activities. Discussed safety using a narrow hall so she can reach the halls if experiences instability. She reports slight increased in dizziness with VOR gait acitvities that improved with repetitions. She continues to require SBA and occasional CGA during higher level balance activities but is using less UE support. Pt will benefit from continued POC.    Personal Factors and Comorbidities  Comorbidity 3+    Comorbidities  CKD stage III, hx breast cancer, endometrial cancer    Examination-Activity Limitations  Other;Locomotion Level    Stability/Clinical Decision Making  Stable/Uncomplicated    Rehab Potential  Good    PT Frequency  2x / week    PT Duration  4 weeks    PT Treatment/Interventions  ADLs/Self Care Home Management;Electrical Stimulation;Gait training;Stair training;Functional mobility training;Therapeutic activities;Neuromuscular re-education;Balance training;Therapeutic exercise;Patient/family education;Manual techniques;Manual lymph drainage;Compression bandaging;Passive range of motion;Taping;Vasopneumatic Device;Joint Manipulations    PT Next Visit Plan  Continue with VOR and balance activities progress these and strengthening as needed. Assess HEP    PT Home Exercise Plan  Access Code: [email or web address]    Consulted and Agree with Plan of Care  Patient       Patient will benefit from skilled therapeutic intervention in order to improve  the following deficits and impairments:  Abnormal gait, Decreased balance, Decreased endurance, Dizziness, Decreased activity tolerance, Improper body mechanics  Visit Diagnosis: Endometrial cancer ([place])  Muscle weakness (generalized)  Other abnormalities of gait and mobility  Physical debility     Problem List Patient Active Problem List   Diagnosis Date Noted  . Deficiency anemia 03/24/2019  . Hypokalemia due to inadequate potassium intake 03/24/2019  . Hypomagnesemia 03/24/2019  . Physical debility 03/24/2019  . Pancytopenia, acquired ([place]) 02/03/2019  . 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 Bone pain 11/10/2018  . Encounter for antineoplastic chemotherapy 10/20/2018  . 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 Goals of care, counseling/discussion 10/14/2018  . CKD (chronic kidney disease), stage III 10/14/2018  . Genetic testing 07/14/2017  . History of breast cancer 06/25/2017  . Family history of stomach cancer   . Vaginal mass 02/13/2017  . Thyroid nodule 02/13/2017  . Pulmonary nodules/lesions, multiple 02/13/2017  . Endometrial cancer ([place]) 09/30/2016  . HTN (hypertension) 01/17/2014  . Chest pain at rest 01/17/2014  . Hyperlipidemia 04/03/2011  . OTHER SPECIFIED DISORDER OF STOMACH AND DUODENUM 10/19/2009    Ander Purpura, PT 05/11/2019, 11:56 AM  [place], [place], [address] Phone: [phone]   Fax:  [phone]  Name: CHUA SAVOY MRN: [id number] Date of Birth: 03-12-1941

## 2019-05-11 NOTE — Patient Instructions (Signed)
Access Code: [email or web address]  URL: [email or web address]  Date: 05/11/2019  Prepared by: Tomma Rakers   Exercises Standing Tandem Balance with Counter Support - 3 reps - 1 sets - 20 seconds 3x on each side stand nearacounter so you can use your hand if you need it. hold - 1x daily - 7x weekly Standing Single Leg Stance with Counter Support - 5 reps - 1 sets                   - perform 5x on each leg with 10 second holds hold - 1x daily - 7x weekly Sit to Stand with Resistance Around Legs - 10 reps - 1 sets - tiethe band around your knees with feet together then put your feet about hip width apart and keep your knees apart do not let them fall together. hold - 1x daily - 7x weekly Walking with Head Rotation - 1 reps - 1 sets - 2laps in the hall hold - 1x daily - 7x weekly Walking with Head Nod - 1 reps - 1 sets - 2 laps in the hall hold - 1x daily - 7x weekly Backwards Walking - 1 reps - 1 sets - 2laps in the hall hold - 1x daily - 7x weekly

## 2019-05-12 NOTE — Progress Notes (Signed)
GYN ONCOLOGY OFFICE VISIT  PCP Nada Libman  Referring Physician    Dr Stann Mainland  CC:  Serous endometrial cancer, vaginal and axillary recurrence  Assessment/Plan:  [name]  is a 78 y.o.  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  PET imaging notable for hypermetabolic activity and right axillary lymph nodes.  Of note the breast cancer at age 60 was left-sided and a reducing mastectomy was performed on the right.   Ultrasound-guided biopsy of the right axillary node was positive for metastatic uterine serous cancer.  S/p six cycles taxol/carbo herceptin Plan to continue with maintenance herceptin  Follow-up in 3 months  HPI: Vicki Cervantes is a 78 y.o.  old parous woman with  high grade serous endometrial cancer. Oncology History Overview Note  Vicki Cervantes  has a remote history of left  breast cancer at age 52 but received BRCA testing approximately 5 years ago which was negative. Her cancer was treated with surgery but no radiation or chemotherapy.   Serous endometrial cancer, MSI stable ER positive, PR neg, Her2/neu 3+      Endometrial cancer ([place])  08/29/2016 Pathology Results   Endometrium, biopsy - HIGH GRADE ENDOMETRIAL CARCINOMA, SEE COMMENT. Microscopic Comment The sections show multiple fragments of adenocarcinoma displaying glandular and papillary patterns associated with high grade cytomorphology characterized by nuclear pleomorphism, prominent nucleoli and brisk mitosis. Immunohistochemical stains show that the tumor cells are positive for vimentin, p16, p53 with increased Ki-67 expression. Estrogen and progesterone receptor stains show patchy weak positivity. No significant positivity is seen with CEA. The findings are consistent with high grade endometrial carcinoma and the overall morphology and phenotypic features favor serous carcinoma.   08/29/2016 Initial Diagnosis   She presented with postmenopausal bleeding   10/03/2016 Imaging    CT C/A/P 09/2016 IMPRESSION: 1. Thickening of the endometrial canal up to 19 mm in fundus, presumably corresponding to the patient's reported endometrial carcinoma. 2. Multiple tiny pulmonary nodules scattered throughout the lungs bilaterally measuring 4 mm or less in size. Nodules of this size are typically considered statistically likely benign. In the setting of known primary malignancy, metastatic disease to the lungs is not excluded, but is not strongly favored on today's examination. Attention on followup studies is recommended to ensure the stability or resolution of these nodules. 3. Subcentimeter low-attenuation lesion in the central aspect of segment 8 of the liver is too small to characterize. This is statistically likely a tiny cyst, but warrants attention on follow-up studies to exclude the possibility of a solitary hepatic metastasis. 4. 1.5 x 1.5 x 1.7 cm well-circumscribed lesion in the proximal stomach. This is of uncertain etiology and significance, and could represent a benign gastric polyp, however, further evaluation with nonemergent endoscopy is suggested in the near future for further evaluation. 5. **An incidental finding of potential clinical significance has been found. 1.1 x 1.6 cm thyroid nodule in the inferior aspect of the right lobe of the thyroid gland. Follow-up evaluation with nonemergent thyroid ultrasound is recommended in the near future to better evaluate this finding. This recommendation follows ACR consensuss guidelines: Managing Incidental Thyroid Nodules Detected on Imaging: White Paper of the ACR Incidental Thyroid Findings Committee. J Am Coll Radiol 2015;12(2):143-150.** 6. Aortic atherosclerosis, in addition to left anterior descending coronary artery disease   10/22/2016 Surgery   Robotic assisted total hysterectomy, BSO and bilateral pelvic lymphadenectomy  Final pathology revealed a 3cm polyp containing serous carcinoma but with no myometrial invasion, no  LVSI and negative nodes.  Stage IA Uterine serous cancer   10/22/2016 Pathology Results   1. Lymph nodes, regional resection, right pelvic - SIX BENIGN LYMPH NODES (0/6). 2. Lymph nodes, regional resection, left pelvic - SEVEN BENIGN LYMPH NODES (0/7). 3. Uterus +/- tubes/ovaries, neoplastic, with right ovary and fallopian tube ENDOMYOMETRIUM - SEROUS CARCINOMA ARISING WITHIN AN ENDOMETRIAL POLYP - NO MYOMETRIAL INVASION IDENTIFIED - ADENOMYOSIS - LEIOMYOMA (1 CM) - SEE ONCOLOGY TABLE AND COMMENT CERVIX - CARCINOMA FOCALLY INVOLVES ENDOCERVICAL GLANDS - NABOTHIAN CYSTS RIGHT ADNEXA - BENIGN OVARY AND FALLOPIAN TUBE - NO CARCINOMA IDENTIFIED 4. Cul-de-sac biopsy - MESOTHELIAL HYPERPLASIA Microscopic Comment 3. ONCOLOGY TABLE-UTERUS, CARCINOMA OR CARCINOSARCOMA Specimen: Uterus, right fallopian tube and ovary Procedure: Total hysterectomy and right salpingo-oophorectomy Lymph node sampling performed: Bilateral pelvic regional resection Specimen integrity: Intact Maximum tumor size: 3 cm (polyp) Histologic type: Serous carcinoma Grade: High grade Myometrial invasion: Not identified Cervical stromal involvement: No, focal endocervical gland involvement Extent of involvement of other organs: Not identified Lymph - vascular invasion: Not identified Peritoneal washings: N/A Lymph nodes: Examined: 0 Sentinel 13 Non-sentinel 13 Total Lymph nodes with metastasis: 0 Isolated tumor cells (< 0.2 mm): 0 Micrometastasis: (> 0.2 mm and < 2.0 mm): 0 Macrometastasis: (> 2.0 mm): 0 Extracapsular extension: N/A Pelvic lymph nodes: 0 involved of 13 lymph nodes. Para-aortic lymph nodes: No para-aortic nodes submitted TNM code: pT1a, pNX FIGO Stage (based on pathologic findings, needs clinical correlation): IA Comment: Immunohistochemistry for cytokeratin AE1/AE3 is performed on all of the lymph nodes (parts 1 & 2) and no metastatic carcinoma is identified.   07/04/2017 Genetic Testing    The patient had genetic testing due to a personal history of breast and uterine cancer, and a family history of stomach cancer.  The Multi-Cancer Panel was ordered. The Multi-Cancer Panel offered by [facility] includes sequencing and/or deletion duplication testing of the following 83 genes: ALK, APC, ATM, AXIN2,BAP1,  BARD1, BLM, BMPR1A, BRCA1, BRCA2, BRIP1, CASR, CDC73, CDH1, CDK4, CDKN1B, CDKN1C, CDKN2A (p14ARF), CDKN2A (p16INK4a), CEBPA, CHEK2, CTNNA1, DICER1, DIS3L2, EGFR (c.2369C>T, p.Thr790Met variant only), EPCAM (Deletion/duplication testing only), FH, FLCN, GATA2, GPC3, GREM1 (Promoter region deletion/duplication testing only), HOXB13 (c.251G>A, p.Gly84Glu), HRAS, KIT, MAX, MEN1, MET, MITF (c.952G>A, p.Glu318Lys variant only), MLH1, MSH2, MSH3, MSH6, MUTYH, NBN, NF1, NF2, NTHL1, PALB2, PDGFRA, PHOX2B, PMS2, POLD1, POLE, POT1, PRKAR1A, PTCH1, PTEN, RAD50, RAD51C, RAD51D, RB1, RECQL4, RET, RUNX1, SDHAF2, SDHA (sequence changes only), SDHB, SDHC, SDHD, SMAD4, SMARCA4, SMARCB1, SMARCE1, STK11, SUFU, TERC, TERT, TMEM127, TP53, TSC1, TSC2, VHL, WRN and WT1.   Results: No pathogenic variants were identified.  A variant of uncertain significance in the gene APC was identified.  c.791A>G (p.Gln264Arg).  The date of this test report is 07/04/2017.     Genetic Testing   Patient has genetic testing done for MSI. 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    12/03/2017 Imaging   CT Chest/Abd/Pelvis to follow pulmonary nodule and gastric mass IMPRESSION: 1. Stable CT of the chest. Small pulmonary nodules are unchanged when compared with previous exam. 2. No new findings identified. 3. Subcentimeter low-attenuation lesions within the liver are remain too small to characterize but are stable from prior exam. 4. Persistent indeterminate low-attenuation structure within the proximal stomach is unchanged measuring 1.4 cm. Correlation with direct visualization is advised    06/30/2018 Imaging   CT CHEST Lungs/Pleura: Stable scattered sub-cm pulmonary nodules are again seen bilaterally and are stable compared to previous studies. No new or enlarging pulmonary  nodules or masses identified. No evidence of pulmonary infiltrate or pleural effusion.   08/19/2018 Echocardiogram   [place] is done in [place]: EF 60-65%. Mild impaired relaxation. (report scanned)   09/17/2018 Relapse/Recurrence   Presented with c/o vagina discharge.  Lesion noted at the left vaginal apex 28m lesion removed Path c/w high grade serous cancer   09/17/2018 Pathology Results   Vagina, biopsy, left apex - HIGH GRADE SEROUS CARCINOMA.   09/28/2018 PET scan   1. Two hypermetabolic axial lymph nodes. Unusual site for metastatic endometrial carcinoma however the activity is more intense than typically seen in reactive adenopathy. Suggest ultrasound-guided percutaneous biopsy of the larger RIGHT axial lymph node 2. No evidence of local recurrence at the vaginal cuff.  3. No metastatic adenopathy in the abdomen or pelvis. 4. Stable small pulmonary nodules   10/09/2018 Pathology Results   Lymph node, needle/core biopsy, right axilla - METASTATIC CARCINOMA, SEE COMMENT. Microscopic Comment The carcinoma appears high grade. Immunohistochemistry is positive for cytokeratin 7, PAX-8, and ER. Cytokeratin 20, CDX-2, PR, and GATA-3 are negative. The findings along with the history are consistent with a gynecologic primary.    10/09/2018 Procedure   Ultrasound-guided core biopsies of a right axillary lymph node.   10/14/2018 Cancer Staging   Staging form: Corpus Uteri - Carcinoma and Carcinosarcoma, AJCC 8th Edition - Clinical: Stage IVB (cT1, cN0, pM1) - Signed by GHeath Lark MD on 10/14/2018   10/19/2018 Imaging   Placement of single lumen port a cath via right internal jugular vein. The catheter tip lies at the cavo-atrial junction. A power injectable port a cath was placed and is ready for immediate use.     10/21/2018 -  Chemotherapy   The patient had carboplatin and taxol for treatment   11/11/2018 -  Chemotherapy   The patient had trastuzumab ([name]) 450 mg in sodium chloride 0.9 % 250 mL chemo infusion, 483 mg, Intravenous,  Once, 6 of 6 cycles Administration: 450 mg (11/11/2018), 378 mg (12/02/2018), 378 mg (02/03/2019), 378 mg (12/23/2018), 378 mg (01/13/2019), 378 mg (02/24/2019) trastuzumab-dkst (OGIVRI) 378 mg in sodium chloride 0.9 % 250 mL chemo infusion, 6 mg/kg = 378 mg (100 % of original dose 6 mg/kg), Intravenous,  Once, 3 of 6 cycles Dose modification: 6 mg/kg (original dose 6 mg/kg, Cycle 7, Reason: Other (see comments)) Administration: 378 mg (03/24/2019), 378 mg (04/14/2019), 378 mg (05/05/2019)  for chemotherapy treatment.    12/21/2018 PET scan   1. Interval resolution of hypermetabolic right axillary lymph nodes. No metabolic findings highly suspicious for recurrent metastatic disease. 2. New mild hypermetabolism within a borderline prominent portacaval lymph node, nonspecific. While a reactive node is favored, a lymph node metastasis cannot be entirely excluded. Suggest attention to this lymph node on follow-up PET-CT in 3-6 months. 3. Nonspecific new hypermetabolism at the ileocecal valve, more likely physiologic given absence of CT correlate. 4. Scattered subcentimeter pulmonary nodules are all stable and below PET resolution, more likely benign, continued CT surveillance advised. 5. Chronic findings include: Aortic Atherosclerosis (ICD10-I70.0). Marked diffuse colonic diverticulosis. Coronary atherosclerosis.    12/28/2018 Echocardiogram   1. The left ventricle has normal systolic function with an ejection fraction of 60-65%. The cavity size was normal. Left ventricular diastolic Doppler parameters are consistent with impaired relaxation.  2. GLS recorded as -10.7 but LV appears hyperdynamic and tracking of endocardium appears poor.  3. The right ventricle has normal systolic  function. The cavity was normal. There is no increase in right ventricular wall  thickness.  4. Mild thickening of the mitral valve leaflet.  5. The aortic valve was not well visualized. Mild thickening of the aortic valve. Aortic valve regurgitation is trivial by color flow Doppler.   03/23/2019 Imaging   PET 1. No findings of hypermetabolic residual/recurrent or metastatic disease. 2. Similar low-level hypermetabolism within normal sized portocaval and right inguinal nodes, favored to be reactive. 3. Ongoing stability of small bilateral pulmonary nodules, favored to be benign. Below PET resolution. 4. Coronary artery atherosclerosis. Aortic Atherosclerosis   03/26/2019 Echocardiogram   1. The left ventricle has normal systolic function, with an ejection fraction of 55-60%. The cavity size was normal. Left ventricular diastolic Doppler parameters are consistent with impaired relaxation.  2. The right ventricle has normal systolic function. The cavity was normal.  3. The mitral valve is abnormal. Mild thickening of the mitral valve leaflet. There is mild mitral annular calcification present.  4. The tricuspid valve is grossly normal.  5. The aortic valve is tricuspid. Mild calcification of the aortic valve. No stenosis of the aortic valve.  6. The aorta is normal unless otherwise noted.  7. Normal LV systolic function; grade 1 diastolic dysfunction; MVV-61.2%.   Metastasis to lymph nodes ([place])  10/14/2018 Initial Diagnosis   Metastasis to lymph nodes ([place])   10/21/2018 - 03/16/2019 Chemotherapy   The patient had palonosetron (ALOXI) injection 0.25 mg, 0.25 mg, Intravenous,  Once, 7 of 7 cycles Administration: 0.25 mg (10/21/2018), 0.25 mg (11/11/2018), 0.25 mg (12/02/2018), 0.25 mg (12/23/2018), 0.25 mg (01/13/2019), 0.25 mg (02/03/2019), 0.25 mg (02/24/2019) CARBOplatin (PARAPLATIN) 310 mg in sodium chloride 0.9 % 250 mL chemo infusion, 310 mg, Intravenous,  Once, 7 of 7 cycles Dose modification: 300 mg  (original dose 311.5 mg, Cycle 4, Reason: Dose not tolerated) Administration: 310 mg (10/21/2018), 300 mg (11/11/2018), 300 mg (12/02/2018), 300 mg (12/23/2018), 300 mg (01/13/2019), 300 mg (02/03/2019), 300 mg (02/24/2019) PACLitaxel (TAXOL) 222 mg in sodium chloride 0.9 % 250 mL chemo infusion (> 12m/m2), 135 mg/m2 = 222 mg, Intravenous,  Once, 7 of 7 cycles Administration: 222 mg (10/21/2018), 222 mg (11/11/2018), 222 mg (12/02/2018), 222 mg (12/23/2018), 222 mg (01/13/2019), 222 mg (02/03/2019), 222 mg (02/24/2019) fosaprepitant (EMEND) 150 mg, dexamethasone (DECADRON) 12 mg in sodium chloride 0.9 % 145 mL IVPB, , Intravenous,  Once, 7 of 7 cycles Administration:  (10/21/2018),  (11/11/2018),  (12/02/2018),  (12/23/2018),  (01/13/2019),  (02/03/2019),  (02/24/2019)  for chemotherapy treatment.    INTERVAL HX:   Review of Systems  Constitutional: Negative for appetite change, chills, fatigue and fever.  HENT:  Negative.   Eyes: Negative for icterus.  Respiratory: Negative for chest tightness, cough and hemoptysis.   Cardiovascular: Negative.   Gastrointestinal: Negative.  Negative for abdominal distention, abdominal pain, rectal pain and vomiting.  Genitourinary: Negative.    Musculoskeletal: Negative.   Skin: Negative.   Neurological: Negative.   Hematological: Negative.      Social Hx:   Social History   Socioeconomic History  . Marital status: Widowed    Spouse name: Not on file  . Number of children: 3  . Years of education: Not on file  . Highest education level: Not on file  Occupational History  . 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 Financial resource strain: Not on file  . Food insecurity    Worry: Not on file    Inability: Not on file  . Transportation needs    Medical: Not  on file    Non-medical: Not on file  Tobacco Use  . Smoking status: Never Smoker  . Smokeless tobacco: Never Used  Substance and Sexual Activity  . Alcohol use: Yes     Alcohol/week: 3.0 standard drinks    Types: 3 Glasses of wine per week    Comment: Wine Occas.  . Drug use: No  . Sexual activity: Never  Lifestyle  . Physical activity    Days per week: Not on file    Minutes per session: Not on file  . Stress: Not on file  Relationships  . Social Herbalist on phone: Not on file    Gets together: Not on file    Attends religious service: Not on file    Active member of club or organization: Not on file    Attends meetings of clubs or organizations: Not on file    Relationship status: Not on file  . Intimate partner violence    Fear of current or ex partner: Not on file    Emotionally abused: Not on file    Physically abused: Not on file    Forced sexual activity: Not on file  Other Topics Concern  . Not on file  Social History Narrative  . Not on file  Spends most of  Her time in [place] with her daughter  Past Surgical Hx:  Past Surgical History:  Procedure Laterality Date  . COLONOSCOPY    . EUS N/A 01/07/2013   Procedure: UPPER ENDOSCOPIC ULTRASOUND (EUS) LINEAR;  Surgeon: Milus Banister, MD;  Location: [facility] ENDOSCOPY;  Service: Endoscopy;  Laterality: N/A;  . EUS N/A 01/05/2015   Procedure: UPPER ENDOSCOPIC ULTRASOUND (EUS) LINEAR;  Surgeon: Milus Banister, MD;  Location: [facility] ENDOSCOPY;  Service: Endoscopy;  Laterality: N/A;  . FOOT SURGERY Left 2009-2010   x2. Surgery in 2011  . IR IMAGING GUIDED PORT INSERTION  10/19/2018  . LEFT HEART CATHETERIZATION WITH CORONARY ANGIOGRAM N/A 01/24/2014   Procedure: LEFT HEART CATHETERIZATION WITH CORONARY ANGIOGRAM;  Surgeon: Peter M Martinique, MD;  Location: [facility] CATH LAB;  Service: Cardiovascular;  Laterality: N/A;  . LUMBAR [place] SURGERY  01-01-13  . MASTECTOMY, RADICAL Left 1982  . OVARY SURGERY  1972-1973  . ROBOTIC ASSISTED TOTAL HYSTERECTOMY WITH BILATERAL SALPINGO OOPHERECTOMY N/A 10/22/2016   Procedure: XI ROBOTIC ASSISTED TOTAL HYSTERECTOMY WITH RIGHT SALPINGO OOPHORECTOMY;  INJECTION OF GREEN DYE WITH EXCISION OF BILATERAL PELVIC LYMPH NODES;  Surgeon: Janie Morning, MD;  Location: [facility] ORS;  Service: Gynecology;  Laterality: N/A;  . ROTATOR CUFF REPAIR Left   . SENTINEL NODE BIOPSY N/A 10/22/2016   Procedure: SENTINEL NODE BIOPSY;  Surgeon: Janie Morning, MD;  Location: [facility] ORS;  Service: Gynecology;  Laterality: N/A;  . SIMPLE MASTECTOMY Left 1982  . TOTAL KNEE ARTHROPLASTY Bilateral 2009-2010   x2  . UPPER GASTROINTESTINAL ENDOSCOPY      Past Medical Hx:  Past Medical History:  Diagnosis Date  . Aortic atherosclerosis ([place])   . Arthritis   . Blood transfusion without reported diagnosis   . Breast cancer ([place])     LEFT, '82-left breast cancer-surgery only  . Cataract    removed both eyes  . Complication of anesthesia    nausea  . Diverticulosis   . Esophageal stricture   . Family history of stomach cancer   . GERD (gastroesophageal reflux disease)   . Hemorrhoids   . Hiatal hernia   . High triglycerides   . Hypercholesteremia   .  Hypertension   . Hypothyroidism   . Osteoarthritis   . PONV (postoperative nausea and vomiting)   . Uterine cancer [facility])   Social Hx: Has a daughter who lives in [place] a son who lives in [place] and and another son who lives in [place].  Enjoys being in the lives of her children and grandchildren  Past Gynecological History:  G2P2 No LMP recorded. Patient is postmenopausal.  Family Hx:  Family History  Problem Relation Age of Onset  . Heart disease Mother   . Other Mother        blood disease  . Heart attack Father   . Colon polyps Sister   . Heart attack Brother   . Colon polyps Brother   . Stomach cancer Maternal Aunt   . Colon cancer Neg Hx   . Esophageal cancer Neg Hx   . Rectal cancer Neg Hx   . Pancreatic cancer Neg Hx    Physical Exam: - There were no vitals taken for this visit. Wt Readings from Last 3 Encounters:  05/05/19 133 lb 11.2 oz (60.6 kg)  04/14/19 137 lb 3.2 oz (62.2  kg)  03/24/19 134 lb 6.4 oz (61 kg)   Physical Exam Constitutional:      Appearance: Normal appearance.  Genitourinary:     Vulva, inguinal canal, urethra, bladder and vagina normal.     Vulval Bartholin's cyst present.     No vulval lesion, tenderness, ulcerations or rash noted.     No posterior fourchette injury present.     No lesions in the vagina.     Vaginal atrophy, atrophic mucosa and prolapse present.     No vaginal discharge, erythema or bleeding.     Cervix is absent.     Uterus is absent.  HENT:     Head: Normocephalic.  Eyes:     General: No scleral icterus.    Extraocular Movements: Extraocular movements intact.     Pupils: Pupils are equal, round, and reactive to light.  Neck:     Musculoskeletal: Normal range of motion and neck supple.  Cardiovascular:     Rate and Rhythm: Normal rate and regular rhythm.     Pulses: Normal pulses.     Heart sounds: Normal heart sounds.  Pulmonary:     Effort: Pulmonary effort is normal.     Breath sounds: Normal breath sounds.  Abdominal:     General: Abdomen is flat. There is no distension.     Palpations: Abdomen is soft. There is no mass.     Tenderness: There is no abdominal tenderness. There is no right CVA tenderness, left CVA tenderness, guarding or rebound.     Hernia: No hernia is present. There is no hernia in the left inguinal area or right inguinal area.  Lymphadenopathy:     Lower Body: No right inguinal adenopathy. No left inguinal adenopathy.  Neurological:     General: No focal deficit present.     Mental Status: She is alert.  Skin:    General: Skin is warm and dry.  Psychiatric:        Mood and Affect: Mood normal.        Thought Content: Thought content normal.        Judgment: Judgment normal.      Janie Morning, MD

## 2019-05-13 ENCOUNTER — Other Ambulatory Visit: Payer: Self-pay

## 2019-05-13 ENCOUNTER — Inpatient Hospital Stay (HOSPITAL_BASED_OUTPATIENT_CLINIC_OR_DEPARTMENT_OTHER): Payer: Medicare HMO | Admitting: Gynecologic Oncology

## 2019-05-13 DIAGNOSIS — Z79899 Other long term (current) drug therapy: Secondary | ICD-10-CM | POA: Diagnosis not present

## 2019-05-13 DIAGNOSIS — R918 Other nonspecific abnormal finding of lung field: Secondary | ICD-10-CM | POA: Diagnosis not present

## 2019-05-13 DIAGNOSIS — C773 Secondary and unspecified malignant neoplasm of axilla and upper limb lymph nodes: Secondary | ICD-10-CM

## 2019-05-13 DIAGNOSIS — C7982 Secondary malignant neoplasm of genital organs: Secondary | ICD-10-CM | POA: Diagnosis not present

## 2019-05-13 DIAGNOSIS — N183 Chronic kidney disease, stage 3 unspecified: Secondary | ICD-10-CM | POA: Diagnosis not present

## 2019-05-13 DIAGNOSIS — C541 Malignant neoplasm of endometrium: Secondary | ICD-10-CM

## 2019-05-13 DIAGNOSIS — D631 Anemia in chronic kidney disease: Secondary | ICD-10-CM | POA: Diagnosis not present

## 2019-05-13 DIAGNOSIS — Z90710 Acquired absence of both cervix and uterus: Secondary | ICD-10-CM | POA: Diagnosis not present

## 2019-05-13 DIAGNOSIS — Z5112 Encounter for antineoplastic immunotherapy: Secondary | ICD-10-CM | POA: Diagnosis not present

## 2019-05-13 DIAGNOSIS — Z7982 Long term (current) use of aspirin: Secondary | ICD-10-CM | POA: Diagnosis not present

## 2019-05-13 NOTE — Patient Instructions (Signed)
Follow up in 3 months at [facility] Stay well!! Hugs  Thank you very much [name] for allowing me to provide care for you today.  I appreciate your confidence in choosing our Gynecologic Oncology team.  If you have any questions about your visit today please call our office and we will get back to you as soon as possible.  Please consider using the website Medlineplus.gov as an Geneticist, molecular.   Francetta Found. Maximilien Hayashi MD., PhD Gynecologic Oncology

## 2019-05-14 ENCOUNTER — Ambulatory Visit: Payer: Medicare HMO

## 2019-05-14 DIAGNOSIS — R5381 Other malaise: Secondary | ICD-10-CM

## 2019-05-14 DIAGNOSIS — M6281 Muscle weakness (generalized): Secondary | ICD-10-CM | POA: Diagnosis not present

## 2019-05-14 DIAGNOSIS — C541 Malignant neoplasm of endometrium: Secondary | ICD-10-CM

## 2019-05-14 DIAGNOSIS — R2689 Other abnormalities of gait and mobility: Secondary | ICD-10-CM

## 2019-05-14 NOTE — Therapy (Signed)
[place], [place], [address] Phone: [phone]   Fax:  [phone]  Physical Therapy Treatment  Patient Details  Name: Vicki Cervantes MRN: [id number] Date of Birth: 1940-09-26 Referring Provider (PT): [name]   Encounter Date: 05/14/2019  PT End of Session - 05/14/19 1108    Visit Number  9    Number of Visits  17    Date for PT Re-Evaluation  06/01/19    PT Start Time  1107    PT Stop Time  1145    PT Time Calculation (min)  38 min    Activity Tolerance  Patient tolerated treatment well    Behavior During Therapy  [facility] for tasks assessed/performed       Past Medical History:  Diagnosis Date  . Aortic atherosclerosis ([place])   . Arthritis   . Blood transfusion without reported diagnosis   . Breast cancer ([place])     LEFT, '82-left breast cancer-surgery only  . Cataract    removed both eyes  . Complication of anesthesia    nausea  . Diverticulosis   . Esophageal stricture   . Family history of stomach cancer   . GERD (gastroesophageal reflux disease)   . Hemorrhoids   . Hiatal hernia   . High triglycerides   . Hypercholesteremia   . Hypertension   . Hypothyroidism   . Osteoarthritis   . PONV (postoperative nausea and vomiting)   . Uterine cancer [facility])     Past Surgical History:  Procedure Laterality Date  . COLONOSCOPY    . EUS N/A 01/07/2013   Procedure: UPPER ENDOSCOPIC ULTRASOUND (EUS) LINEAR;  Surgeon: Milus Banister, MD;  [facility] ENDOSCOPY;  Service: Endoscopy;  Laterality: N/A;  . EUS N/A 01/05/2015   Procedure: UPPER ENDOSCOPIC ULTRASOUND (EUS) LINEAR;  Surgeon: Milus Banister, MD;  Location: [facility] ENDOSCOPY;  Service: Endoscopy;  Laterality: N/A;  . FOOT SURGERY Left 2009-2010   x2. Surgery in 2011  . IR IMAGING GUIDED PORT INSERTION  10/19/2018  . LEFT HEART CATHETERIZATION WITH CORONARY ANGIOGRAM N/A 01/24/2014   Procedure: LEFT HEART CATHETERIZATION WITH CORONARY  ANGIOGRAM;  Surgeon: Peter M Martinique, MD;  Location: [facility] CATH LAB;  Service: Cardiovascular;  Laterality: N/A;  . LUMBAR [place] SURGERY  01-01-13  . MASTECTOMY, RADICAL Left 1982  . OVARY SURGERY  1972-1973  . ROBOTIC ASSISTED TOTAL HYSTERECTOMY WITH BILATERAL SALPINGO OOPHERECTOMY N/A 10/22/2016   Procedure: XI ROBOTIC ASSISTED TOTAL HYSTERECTOMY WITH RIGHT SALPINGO OOPHORECTOMY; INJECTION OF GREEN DYE WITH EXCISION OF BILATERAL PELVIC LYMPH NODES;  Surgeon: Janie Morning, MD;  Location: [facility] ORS;  Service: Gynecology;  Laterality: N/A;  . ROTATOR CUFF REPAIR Left   . SENTINEL NODE BIOPSY N/A 10/22/2016   Procedure: SENTINEL NODE BIOPSY;  Surgeon: Janie Morning, MD;  Location: [facility] ORS;  Service: Gynecology;  Laterality: N/A;  . SIMPLE MASTECTOMY Left 1982  . TOTAL KNEE ARTHROPLASTY Bilateral 2009-2010   x2  . UPPER GASTROINTESTINAL ENDOSCOPY      There were no vitals filed for this visit.  Subjective Assessment - 05/14/19 1109    Subjective  Pt reports that she was running a little bit late because her son called her and she needs to leave 15 min early. She has been performing her HEp at home and states that walking backwards is hard.    Pertinent History  Pt has a history of L radical mastectomy when she was 41 and R simple mastectomy. No previous signs  of edema in her B arms. Pt had a hysterectomy 2 years ago due to endometrial cancer which returned this year.    Currently in Pain?  No/denies                       [facility] Adult PT Treatment/Exercise - 05/14/19 0001      Neuro Re-ed    Neuro Re-ed Details   foam step over with SBA and unlateral UE support for first few steps then pt improved significantly from initially trying this activity demonstrating improvement in proprioception/balance and strength 10x Bil, SLS on airex 5x 10 seconds w/intermittent UE support, Tandem stance on airex w/SBA and intermittent UE support/VC for glute squeeze,  wgt shifting on flat side BOSu  fwd/back/R/L with close SBA  no UE support and improve mobility VC for more fwd shifting, Squat on flat side BOSU with close SBA and no UE suppose/VC to keep knees apart, Step up with pause on rounded side of BOSU 10x Bil with CGA  and UE support consistent w/LLE and intermittent w/RLE, 4" step up with pause inititally UE assist required VC to slow down and focus on bottom of foot then no UE assist required and SBA.       Knee/Hip Exercises: Standing   Heel Raises  Both;1 set;20 reps    Heel Raises Limitations  VC for glute squeeze    Other Standing Knee Exercises  Sit to stand 15x visual input w/mirror and VC to keep knees apart.     Other Standing Knee Exercises  side stepping 4 laps at counter.              PT Education - 05/14/19 1146    Education Details  Continue with current HEP    Person(s) Educated  Patient    Methods  Explanation    Comprehension  Verbalized understanding       PT Short Term Goals - 05/04/19 1108      PT SHORT TERM GOAL #1   Title  Pt will be able to state precautions and risk factors for lymphedema within 4 weeks in order to decrease risk for lymphedema in the future related to lymphnode removal and surgery.    Baseline  Pt remembers what lymphedema is and is able to ask if leggings are an appropriate clothing for lymphedema prevention    Time  4    Period  Weeks    Status  On-going    Target Date  05/10/19        PT Long Term Goals - 05/04/19 1110      PT LONG TERM GOAL #1   Title  Pt will perform 52/56 on the BERG balance test in order to demonstrate a decrease risk for falls.    Baseline  50/56    Time  4    Period  Weeks    Status  On-going    Target Date  06/08/19      PT LONG TERM GOAL #2   Title  The patient will maintain single limb stance x 10 seconds bilaterally.    Baseline  RLE 7 seconds, LLE 2 seconds with multiple attempts    Time  4    Period  Weeks    Status  On-going    Target Date  06/08/19      PT LONG TERM GOAL #3    Title  The patient will return demo HEP for core strength, L hip stretching/strengthening and  high level balance.    Baseline  Pt is working on HEP at home.    Time  4    Period  Weeks    Status  On-going    Target Date  06/08/19            Plan - 05/14/19 1108    Clinical Impression Statement  Pt continues to improve and tolerate increased difficulty with ther-ex and neuromusuclar rehablitation. Pt continues with decrease awareness of the foot and decreased proprioception that improves significant with VC to pay attention to the bottom of the foot and pressures. Pt requires more UE support with LLE activities than RLE and requires less CGA this session than previously with high level balance activities. Close SBA/SBA is needed due to continued insecurity with high level balance activities. Pt demonstrates improvement in endurance as demonstrated by pt was only able to perform 8x foam step over due to fatigue on Bil LE but is now able to perform multiple step up/over activities on Bil w/o rest breaks, shortness or breathe or impairement in form. Pt will benefit from continued POC.    Personal Factors and Comorbidities  Comorbidity 3+    Comorbidities  CKD stage III, hx breast cancer, endometrial cancer    Examination-Activity Limitations  Other;Locomotion Level    Stability/Clinical Decision Making  Stable/Uncomplicated    Rehab Potential  Good    PT Frequency  2x / week    PT Duration  4 weeks    PT Treatment/Interventions  ADLs/Self Care Home Management;Electrical Stimulation;Gait training;Stair training;Functional mobility training;Therapeutic activities;Neuromuscular re-education;Balance training;Therapeutic exercise;Patient/family education;Manual techniques;Manual lymph drainage;Compression bandaging;Passive range of motion;Taping;Vasopneumatic Device;Joint Manipulations    PT Next Visit Plan  update HEP with more walking activities/balance and add side step ups    PT Home Exercise  Plan  Access Code: [email or web address]    Consulted and Agree with Plan of Care  Patient       Patient will benefit from skilled therapeutic intervention in order to improve the following deficits and impairments:  Abnormal gait, Decreased balance, Decreased endurance, Dizziness, Decreased activity tolerance, Improper body mechanics  Visit Diagnosis: Endometrial cancer ([place])  Muscle weakness (generalized)  Other abnormalities of gait and mobility  Physical debility     Problem List Patient Active Problem List   Diagnosis Date Noted  . Deficiency anemia 03/24/2019  . Hypokalemia due to inadequate potassium intake 03/24/2019  . Hypomagnesemia 03/24/2019  . Physical debility 03/24/2019  . Pancytopenia, acquired ([place]) 02/03/2019  . 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 Bone pain 11/10/2018  . Encounter for antineoplastic chemotherapy 10/20/2018  . 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 Goals of care, counseling/discussion 10/14/2018  . CKD (chronic kidney disease), stage III 10/14/2018  . Genetic testing 07/14/2017  . History of breast cancer 06/25/2017  . Family history of stomach cancer   . Vaginal mass 02/13/2017  . Thyroid nodule 02/13/2017  . Pulmonary nodules/lesions, multiple 02/13/2017  . Endometrial cancer ([place]) 09/30/2016  . HTN (hypertension) 01/17/2014  . Chest pain at rest 01/17/2014  . Hyperlipidemia 04/03/2011  . OTHER SPECIFIED DISORDER OF STOMACH AND DUODENUM 10/19/2009    Ander Purpura, PT 05/14/2019, 11:51 AM  [place], [place], [address] Phone: [phone]   Fax:  [phone]  Name: Vicki Cervantes MRN: [id number] Date of Birth: September 18, 1940

## 2019-05-17 ENCOUNTER — Ambulatory Visit: Payer: Medicare HMO | Attending: Hematology and Oncology

## 2019-05-17 ENCOUNTER — Other Ambulatory Visit: Payer: Self-pay

## 2019-05-17 DIAGNOSIS — M6281 Muscle weakness (generalized): Secondary | ICD-10-CM | POA: Insufficient documentation

## 2019-05-17 DIAGNOSIS — R2689 Other abnormalities of gait and mobility: Secondary | ICD-10-CM | POA: Diagnosis not present

## 2019-05-17 DIAGNOSIS — C541 Malignant neoplasm of endometrium: Secondary | ICD-10-CM | POA: Insufficient documentation

## 2019-05-17 DIAGNOSIS — R5381 Other malaise: Secondary | ICD-10-CM | POA: Diagnosis not present

## 2019-05-17 NOTE — Therapy (Signed)
[place], [place], [address] Phone: [phone]   Fax:  [phone]  Physical Therapy Treatment  Patient Details  Name: Vicki Cervantes MRN: [id number] Date of Birth: 1940/10/27 Referring Provider (PT): [name]   Encounter Date: 05/17/2019  PT End of Session - 05/17/19 1101    Visit Number  10    Number of Visits  17    Date for PT Re-Evaluation  06/01/19    PT Start Time  1100    PT Stop Time  1156    PT Time Calculation (min)  56 min    Activity Tolerance  Patient tolerated treatment well    Behavior During Therapy  [facility] for tasks assessed/performed       Past Medical History:  Diagnosis Date  . Aortic atherosclerosis ([place])   . Arthritis   . Blood transfusion without reported diagnosis   . Breast cancer ([place])     LEFT, '82-left breast cancer-surgery only  . Cataract    removed both eyes  . Complication of anesthesia    nausea  . Diverticulosis   . Esophageal stricture   . Family history of stomach cancer   . GERD (gastroesophageal reflux disease)   . Hemorrhoids   . Hiatal hernia   . High triglycerides   . Hypercholesteremia   . Hypertension   . Hypothyroidism   . Osteoarthritis   . PONV (postoperative nausea and vomiting)   . Uterine cancer [facility])     Past Surgical History:  Procedure Laterality Date  . COLONOSCOPY    . EUS N/A 01/07/2013   Procedure: UPPER ENDOSCOPIC ULTRASOUND (EUS) LINEAR;  Surgeon: Milus Banister, MD;  [facility];  Service: Endoscopy;  Laterality: N/A;  . EUS N/A 01/05/2015   Procedure: UPPER ENDOSCOPIC ULTRASOUND (EUS) LINEAR;  Surgeon: Milus Banister, MD;  [facility]OS[facility];  Service: Endoscopy;  Laterality: N/A;  . FOOT SURGERY Left 2009-2010   x2. Surgery in 2011  . IR IMAGING GUIDED PORT INSERTION  10/19/2018  . LEFT HEART CATHETERIZATION WITH CORONARY ANGIOGRAM N/A 01/24/2014   Procedure: LEFT HEART CATHETERIZATION WITH CORONARY  ANGIOGRAM;  Surgeon: Peter M Martinique, MD;  Location: [facility] CATH LAB;  Service: Cardiovascular;  Laterality: N/A;  . LUMBAR [place] SURGERY  01-01-13  . MASTECTOMY, RADICAL Left 1982  . OVARY SURGERY  1972-1973  . ROBOTIC ASSISTED TOTAL HYSTERECTOMY WITH BILATERAL SALPINGO OOPHERECTOMY N/A 10/22/2016   Procedure: XI ROBOTIC ASSISTED TOTAL HYSTERECTOMY WITH RIGHT SALPINGO OOPHORECTOMY; INJECTION OF GREEN DYE WITH EXCISION OF BILATERAL PELVIC LYMPH NODES;  Surgeon: Janie Morning, MD;  [facility]S;  Service: Gynecology;  Laterality: N/A;  . ROTATOR CUFF REPAIR Left   . SENTINEL NODE BIOPSY N/A 10/22/2016   Procedure: SENTINEL NODE BIOPSY;  Surgeon: Janie Morning, MD;  [facility]S;  Service: Gynecology;  Laterality: N/A;  . SIMPLE MASTECTOMY Left 1982  . TOTAL KNEE ARTHROPLASTY Bilateral 2009-2010   x2  . UPPER GASTROINTESTINAL ENDOSCOPY      There were no vitals filed for this visit.  Subjective Assessment - 05/17/19 1104    Subjective  Pt reports that she is doing well today. She has been working on her exercises at home. Pt reports a little fatigue after her last session with no significant increase in muscle soreness.    Pertinent History  Pt has a history of L radical mastectomy when she was 41 and R simple mastectomy. No previous signs of edema in her B  arms. Pt had a hysterectomy 2 years ago due to endometrial cancer which returned this year.    Currently in Pain?  No/denies                       [facility] Adult PT Treatment/Exercise - 05/17/19 0001      Neuro Re-ed    Neuro Re-ed Details   Flat side of BOSU wgt shifting R/L/Fwd/Back 20x each, Staggered stance w/alt feet fwd 20x each on flat side of BOSU, 10x squat on BOSU w/VC for posterior placement of the hips behind the ankles. 4" step up w/pause on box 2 sets 10 reps Bil w/intermittent UE assist on counter that improved with repetitions greater need for assist on the L than the R, VC to not rush activity and focus on  sensations in the feet in order to improve proprioceptive input. Lateral step down touch with rest on top of the step between reps 10x Bil w/CGA, Side stepping over 4" box with alt leg tap 10x Bil w/SBA-CGA and intermittent UE support on raised plinth table. karoke walking in the hall 1/2 hall length 3 laps R/L w/CGA and intermittent VC for instruction with demonstration initially. SLSairex 5x 10 seconds Bil, 3x 20 seconds tandem stance on airex w/intermittent UE support.        Knee/Hip Exercises: Supine   Bridges  Strengthening;Both;20 reps    Bridges Limitations  tactile cueing for correct foot placement to prevent uneven foot placement and feet hip width apart.     Straight Leg Raises  Strengthening;10 reps;Right;Left;2 sets    Straight Leg Raises Limitations  VC for transverse abdominus activation and toes to nose to keep ankle at 90 degrees throughout movement.     Straight Leg Raise with External Rotation  Strengthening;Right;Left;10 reps;2 sets    Straight Leg Raise with External Rotation Limitations  tactile cueing for external rotation at Bil LE, initially tetany present due to weakness which improved with repetitions demonstrating inrcreased muscle recruitment.              PT Education - 05/17/19 1151    Education Details  Continue with current HEP    Person(s) Educated  Patient    Methods  Explanation    Comprehension  Verbalized understanding       PT Short Term Goals - 05/04/19 1108      PT SHORT TERM GOAL #1   Title  Pt will be able to state precautions and risk factors for lymphedema within 4 weeks in order to decrease risk for lymphedema in the future related to lymphnode removal and surgery.    Baseline  Pt remembers what lymphedema is and is able to ask if leggings are an appropriate clothing for lymphedema prevention    Time  4    Period  Weeks    Status  On-going    Target Date  05/10/19        PT Long Term Goals - 05/04/19 1110      PT LONG TERM GOAL #1    Title  Pt will perform 52/56 on the BERG balance test in order to demonstrate a decrease risk for falls.    Baseline  50/56    Time  4    Period  Weeks    Status  On-going    Target Date  06/08/19      PT LONG TERM GOAL #2   Title  The patient will maintain single limb stance x 10  seconds bilaterally.    Baseline  RLE 7 seconds, LLE 2 seconds with multiple attempts    Time  4    Period  Weeks    Status  On-going    Target Date  06/08/19      PT LONG TERM GOAL #3   Title  The patient will return demo HEP for core strength, L hip stretching/strengthening and high level balance.    Baseline  Pt is working on HEP at home.    Time  4    Period  Weeks    Status  On-going    Target Date  06/08/19            Plan - 05/17/19 1100    Clinical Impression Statement  Pt was able to tolerate an increase in side stepping pelvic stabilization ther-ex this session w/o compensation. Pt requires only SBA on the BOSU for fwd back and R/L wgt shifting. She requires CGA for gait activities and only uses intermittent UE asisst w/balance activities on the airex demonstrating improved ankle/hip strategy for maintaining balance. Pt had difficulty with coordination activities including side step over 4" box and karaoke walking in the hall. Pt will benefit from continued POC.    Personal Factors and Comorbidities  Comorbidity 3+    Comorbidities  CKD stage III, hx breast cancer, endometrial cancer    Stability/Clinical Decision Making  Stable/Uncomplicated    Rehab Potential  Good    PT Frequency  2x / week    PT Duration  4 weeks    PT Treatment/Interventions  ADLs/Self Care Home Management;Electrical Stimulation;Gait training;Stair training;Functional mobility training;Therapeutic activities;Neuromuscular re-education;Balance training;Therapeutic exercise;Patient/family education;Manual techniques;Manual lymph drainage;Compression bandaging;Passive range of motion;Taping;Vasopneumatic Device;Joint  Manipulations    PT Home Exercise Plan  Access Code: [email or web address]    Consulted and Agree with Plan of Care  Patient       Patient will benefit from skilled therapeutic intervention in order to improve the following deficits and impairments:  Abnormal gait, Decreased balance, Decreased endurance, Dizziness, Decreased activity tolerance, Improper body mechanics  Visit Diagnosis: Endometrial cancer ([place])  Muscle weakness (generalized)  Other abnormalities of gait and mobility  Physical debility     Problem List Patient Active Problem List   Diagnosis Date Noted  . Deficiency anemia 03/24/2019  . Hypokalemia due to inadequate potassium intake 03/24/2019  . Hypomagnesemia 03/24/2019  . Physical debility 03/24/2019  . Pancytopenia, acquired ([place]) 02/03/2019  . 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 Bone pain 11/10/2018  . Encounter for antineoplastic chemotherapy 10/20/2018  . 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 Goals of care, counseling/discussion 10/14/2018  . CKD (chronic kidney disease), stage III 10/14/2018  . Genetic testing 07/14/2017  . History of breast cancer 06/25/2017  . Family history of stomach cancer   . Vaginal mass 02/13/2017  . Thyroid nodule 02/13/2017  . Pulmonary nodules/lesions, multiple 02/13/2017  . Endometrial cancer ([place]) 09/30/2016  . HTN (hypertension) 01/17/2014  . Chest pain at rest 01/17/2014  . Hyperlipidemia 04/03/2011  . OTHER SPECIFIED DISORDER OF STOMACH AND DUODENUM 10/19/2009    Ander Purpura, PT 05/17/2019, 11:57 AM  [place], [place], [address] Phone: [phone]   Fax:  [phone]  Name: Vicki Cervantes MRN: [id number] Date of Birth: 08/12/1940

## 2019-05-18 ENCOUNTER — Telehealth: Payer: Self-pay | Admitting: *Deleted

## 2019-05-18 NOTE — Telephone Encounter (Signed)
Called and scheduled the patient for an appt at [facility] with Dr. Skeet Latch. The appt is 08/03/2019 at 8:45 am. Called and gave the patient th appt.

## 2019-05-25 ENCOUNTER — Ambulatory Visit: Payer: Medicare HMO

## 2019-05-25 ENCOUNTER — Other Ambulatory Visit: Payer: Self-pay

## 2019-05-25 DIAGNOSIS — C541 Malignant neoplasm of endometrium: Secondary | ICD-10-CM

## 2019-05-25 DIAGNOSIS — R2689 Other abnormalities of gait and mobility: Secondary | ICD-10-CM | POA: Diagnosis not present

## 2019-05-25 DIAGNOSIS — M6281 Muscle weakness (generalized): Secondary | ICD-10-CM | POA: Diagnosis not present

## 2019-05-25 DIAGNOSIS — R5381 Other malaise: Secondary | ICD-10-CM

## 2019-05-25 NOTE — Patient Instructions (Signed)
Access Code: [email or web address]  URL: [email or web address]  Date: 05/25/2019  Prepared by: Tomma Rakers   Exercises Walking with Head Rotation - 1 reps - 1 sets - 2laps in the hall hold - 1x daily - 7x weekly Walking with Head Nod - 1 reps - 1 sets - 2 laps in the hall hold - 1x daily - 7x weekly Braided Sidestepping - 1 reps - 1 sets - 2 laps in hallway take your time. hold - 1x daily - 7x weekly Step Up - 10 reps - 1 sets - 10 times on each leg, have the railings nearby with your hands over them and try to pause on the leg that you are stepping up with for 1-2 seconds before putting the other foot on the step. hold - 1x daily - 7x weekly Backwards Walking - 1 reps - 1 sets - 2laps in the hall hold - 1x daily - 7x weekly Lateral Step Up - 10 reps - 1 sets - do not pause with this one just step up and down and have the railings nearby for safety. hold - 1x daily - 7x weekly

## 2019-05-25 NOTE — Therapy (Signed)
[place], [place], [address] Phone: [phone]   Fax:  [phone]  Physical Therapy Treatment  Patient Details  Name: Vicki Cervantes MRN: [id number] Date of Birth: [birth year] Referring Provider (PT): [name]   Encounter Date: 05/25/2019  PT End of Session - 05/25/19 1113    Visit Number  11    Number of Visits  17    Date for PT Re-Evaluation  06/01/19    PT Start Time  1108    PT Stop Time  1153    PT Time Calculation (min)  45 min    Activity Tolerance  Patient tolerated treatment well    Behavior During Therapy  [facility] for tasks assessed/performed       Past Medical History:  Diagnosis Date  . Aortic atherosclerosis ([place])   . Arthritis   . Blood transfusion without reported diagnosis   . Breast cancer ([place])     LEFT, '82-left breast cancer-surgery only  . Cataract    removed both eyes  . Complication of anesthesia    nausea  . Diverticulosis   . Esophageal stricture   . Family history of stomach cancer   . GERD (gastroesophageal reflux disease)   . Hemorrhoids   . Hiatal hernia   . High triglycerides   . Hypercholesteremia   . Hypertension   . Hypothyroidism   . Osteoarthritis   . PONV (postoperative nausea and vomiting)   . Uterine cancer [facility])     Past Surgical History:  Procedure Laterality Date  . COLONOSCOPY    . EUS N/A 01/07/2013   Procedure: UPPER ENDOSCOPIC ULTRASOUND (EUS) LINEAR;  Surgeon: Milus Banister, MD;  Location: [facility] ENDOS[facility];  Service: Endoscopy;  Laterality: N/A;  . EUS N/A 01/05/2015   Procedure: UPPER ENDOSCOPIC ULTRASOUND (EUS) LINEAR;  Surgeon: Milus Banister, MD;  Location: [facility];  Service: Endoscopy;  Laterality: N/A;  . FOOT SURGERY Left 2009-2010   x2. Surgery in 2011  . IR IMAGING GUIDED PORT INSERTION  10/19/2018  . LEFT HEART CATHETERIZATION WITH CORONARY ANGIOGRAM N/A 01/24/2014   Procedure: LEFT HEART CATHETERIZATION WITH CORONARY  ANGIOGRAM;  Surgeon: Peter M Martinique, MD;  Location: [facility] CATH LAB;  Service: Cardiovascular;  Laterality: N/A;  . LUMBAR [place] SURGERY  01-01-13  . MASTECTOMY, RADICAL Left 1982  . OVARY SURGERY  1972-1973  . ROBOTIC ASSISTED TOTAL HYSTERECTOMY WITH BILATERAL SALPINGO OOPHERECTOMY N/A 10/22/2016   Procedure: XI ROBOTIC ASSISTED TOTAL HYSTERECTOMY WITH RIGHT SALPINGO OOPHORECTOMY; INJECTION OF GREEN DYE WITH EXCISION OF BILATERAL PELVIC LYMPH NODES;  Surgeon: Janie Morning, MD;  Location: [facility] ORS;  Service: Gynecology;  Laterality: N/A;  . ROTATOR CUFF REPAIR Left   . SENTINEL NODE BIOPSY N/A 10/22/2016   Procedure: SENTINEL NODE BIOPSY;  Surgeon: Janie Morning, MD;  Location: [facility];  Service: Gynecology;  Laterality: N/A;  . SIMPLE MASTECTOMY Left 1982  . TOTAL KNEE ARTHROPLASTY Bilateral 2009-2010   x2  . UPPER GASTROINTESTINAL ENDOSCOPY      There were no vitals filed for this visit.  Subjective Assessment - 05/25/19 1111    Subjective  Pt reports that she is still performing her exercises at home. She states that she feels like her SLS activity fluctuates in how well she does but overall feels that her balance is better.    Pertinent History  Pt has a history of L radical mastectomy when she was 41 and R simple mastectomy. No previous signs of edema in her  B arms. Pt had a hysterectomy 2 years ago due to endometrial cancer which returned this year.    Currently in Pain?  No/denies                       [facility] Adult PT Treatment/Exercise - 05/25/19 0001      Neuro Re-ed    Neuro Re-ed Details   Quick stepping up on 4" step with SBA and no UE support 10x Bil LE, Quick side stepping completely across 4" step w/close SBA no UE support starting slow and increasing speed, tandem stance 1x 20 seconds Bil w/o UE support, Rocking on flat side BOSU side/side and fwd/back with SBA and no UE support, Squats on flat side BOSU 10x with SBA and only 3x UE assist to maintain balance.        Knee/Hip Exercises: Standing   Lateral Step Up  Right;Left;1 set;10 reps    Lateral Step Up Limitations  6 inch step, VC for keeping knee aligned with second toes on stepping up. No pause    Forward Step Up  Right;Left;1 set;10 reps    Forward Step Up Limitations  6" step VC to keep knee aligned with second toe during step up small 1-2 second pause right after stepping up for balance w/intemrittent UE support.     Other Standing Knee Exercises  Heel raises 20x w/glute squeeze and no UE support.       Knee/Hip Exercises: Supine   Bridges  Strengthening;Both;20 reps    Bridges Limitations  VC to squeeze glutes.     Straight Leg Raises  Strengthening;10 reps;Right;Left;2 sets    Straight Leg Raises Limitations  VC for correct movement and for transverse abdominus activation    Straight Leg Raise with External Rotation  Strengthening;Right;Left;10 reps;2 sets    Straight Leg Raise with External Rotation Limitations  VC for correct movement. VC for transverse abdominus activation             PT Education - 05/25/19 1138    Education Details  Access Code: [email or web address], Discussed the importance of safety and having something solid close by such as stair railings when performing exercises on the stairs. As well as taking her time to make sure that she has her balance.    Person(s) Educated  Patient    Methods  Explanation;Demonstration;Verbal cues;Handout    Comprehension  Verbalized understanding;Returned demonstration       PT Short Term Goals - 05/04/19 1108      PT SHORT TERM GOAL #1   Title  Pt will be able to state precautions and risk factors for lymphedema within 4 weeks in order to decrease risk for lymphedema in the future related to lymphnode removal and surgery.    Baseline  Pt remembers what lymphedema is and is able to ask if leggings are an appropriate clothing for lymphedema prevention    Time  4    Period  Weeks    Status  On-going    Target Date  05/10/19        PT  Long Term Goals - 05/04/19 1110      PT LONG TERM GOAL #1   Title  Pt will perform 52/56 on the BERG balance test in order to demonstrate a decrease risk for falls.    Baseline  50/56    Time  4    Period  Weeks    Status  On-going    Target Date  06/08/19  PT LONG TERM GOAL #2   Title  The patient will maintain single limb stance x 10 seconds bilaterally.    Baseline  RLE 7 seconds, LLE 2 seconds with multiple attempts    Time  4    Period  Weeks    Status  On-going    Target Date  06/08/19      PT LONG TERM GOAL #3   Title  The patient will return demo HEP for core strength, L hip stretching/strengthening and high level balance.    Baseline  Pt is working on HEP at home.    Time  4    Period  Weeks    Status  On-going    Target Date  06/08/19            Plan - 05/25/19 1112    Clinical Impression Statement  Pt was able to tolerate an increased difficulty with LE strengthening this session w/o an increase in pain or need from assist for baseline. Pt was able to perform squats on flat side of BOSU w/o CGA this session and only 3x UE support on raised plinth table to regain balance. Pt HEP was updated this session with greater difficulty with LE activities/balance activities and gait activities this session for home. Discussed safety and making sure that she has something nearby for stability if needed. Pt will benefit from continued POC.    Personal Factors and Comorbidities  Comorbidity 3+    Comorbidities  CKD stage III, hx breast cancer, endometrial cancer    Examination-Activity Limitations  Other;Locomotion Level    PT Frequency  2x / week    PT Duration  4 weeks    PT Treatment/Interventions  ADLs/Self Care Home Management;Electrical Stimulation;Gait training;Stair training;Functional mobility training;Therapeutic activities;Neuromuscular re-education;Balance training;Therapeutic exercise;Patient/family education;Manual techniques;Manual lymph drainage;Compression  bandaging;Passive range of motion;Taping;Vasopneumatic Device;Joint Manipulations    PT Next Visit Plan  Continue with increased difficulty w/gait and dynamic balance activities    PT Home Exercise Plan  Access Code: [email or web address]    Consulted and Agree with Plan of Care  Patient       Patient will benefit from skilled therapeutic intervention in order to improve the following deficits and impairments:  Abnormal gait, Decreased balance, Decreased endurance, Dizziness, Decreased activity tolerance, Improper body mechanics  Visit Diagnosis: Endometrial cancer ([place])  Muscle weakness (generalized)  Other abnormalities of gait and mobility  Physical debility     Problem List Patient Active Problem List   Diagnosis Date Noted  . Deficiency anemia 03/24/2019  . Hypokalemia due to inadequate potassium intake 03/24/2019  . Hypomagnesemia 03/24/2019  . Physical debility 03/24/2019  . Pancytopenia, acquired ([place]) 02/03/2019  . 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 Bone pain 11/10/2018  . Encounter for antineoplastic chemotherapy 10/20/2018  . 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 Goals of care, counseling/discussion 10/14/2018  . CKD (chronic kidney disease), stage III 10/14/2018  . Genetic testing 07/14/2017  . History of breast cancer 06/25/2017  . Family history of stomach cancer   . Vaginal mass 02/13/2017  . Thyroid nodule 02/13/2017  . Pulmonary nodules/lesions, multiple 02/13/2017  . Endometrial cancer ([place]) 09/30/2016  . HTN (hypertension) 01/17/2014  . Chest pain at rest 01/17/2014  . Hyperlipidemia 04/03/2011  . OTHER SPECIFIED DISORDER OF STOMACH AND DUODENUM 10/19/2009    Ander Purpura, PT 05/25/2019, 11:55 AM  [place], [place], [address] Phone: [phone]   Fax:  [phone]  Name: Vicki Cervantes MRN: [id number] Date of Birth: 1940-09-10

## 2019-05-26 ENCOUNTER — Inpatient Hospital Stay (HOSPITAL_BASED_OUTPATIENT_CLINIC_OR_DEPARTMENT_OTHER): Payer: Medicare HMO | Admitting: Hematology and Oncology

## 2019-05-26 ENCOUNTER — Inpatient Hospital Stay: Payer: Medicare HMO | Attending: Gynecologic Oncology

## 2019-05-26 ENCOUNTER — Inpatient Hospital Stay: Payer: Medicare HMO

## 2019-05-26 ENCOUNTER — Other Ambulatory Visit: Payer: Self-pay | Admitting: Hematology and Oncology

## 2019-05-26 ENCOUNTER — Other Ambulatory Visit: Payer: Self-pay

## 2019-05-26 ENCOUNTER — Telehealth: Payer: Self-pay | Admitting: *Deleted

## 2019-05-26 ENCOUNTER — Encounter: Payer: Self-pay | Admitting: Hematology and Oncology

## 2019-05-26 DIAGNOSIS — E042 Nontoxic multinodular goiter: Secondary | ICD-10-CM | POA: Diagnosis not present

## 2019-05-26 DIAGNOSIS — Z5112 Encounter for antineoplastic immunotherapy: Secondary | ICD-10-CM | POA: Insufficient documentation

## 2019-05-26 DIAGNOSIS — Z79899 Other long term (current) drug therapy: Secondary | ICD-10-CM | POA: Insufficient documentation

## 2019-05-26 DIAGNOSIS — C541 Malignant neoplasm of endometrium: Secondary | ICD-10-CM | POA: Diagnosis not present

## 2019-05-26 DIAGNOSIS — N189 Chronic kidney disease, unspecified: Secondary | ICD-10-CM | POA: Diagnosis not present

## 2019-05-26 DIAGNOSIS — R918 Other nonspecific abnormal finding of lung field: Secondary | ICD-10-CM | POA: Insufficient documentation

## 2019-05-26 DIAGNOSIS — C773 Secondary and unspecified malignant neoplasm of axilla and upper limb lymph nodes: Secondary | ICD-10-CM

## 2019-05-26 DIAGNOSIS — D630 Anemia in neoplastic disease: Secondary | ICD-10-CM | POA: Diagnosis not present

## 2019-05-26 DIAGNOSIS — Z7982 Long term (current) use of aspirin: Secondary | ICD-10-CM | POA: Diagnosis not present

## 2019-05-26 DIAGNOSIS — D631 Anemia in chronic kidney disease: Secondary | ICD-10-CM | POA: Insufficient documentation

## 2019-05-26 DIAGNOSIS — Z7189 Other specified counseling: Secondary | ICD-10-CM

## 2019-05-26 LAB — CBC WITH DIFFERENTIAL (CANCER CENTER ONLY)
Abs Immature Granulocytes: 0.02 10*3/uL (ref 0.00–0.07)
Basophils Absolute: 0 10*3/uL (ref 0.0–0.1)
Basophils Relative: 1 %
Eosinophils Absolute: 0.1 10*3/uL (ref 0.0–0.5)
Eosinophils Relative: 3 %
HCT: 29.9 % — ABNORMAL LOW (ref 36.0–46.0)
Hemoglobin: 10.6 g/dL — ABNORMAL LOW (ref 12.0–15.0)
Immature Granulocytes: 0 %
Lymphocytes Relative: 28 %
Lymphs Abs: 1.5 10*3/uL (ref 0.7–4.0)
MCH: 33.7 pg (ref 26.0–34.0)
MCHC: 35.5 g/dL (ref 30.0–36.0)
MCV: 94.9 fL (ref 80.0–100.0)
Monocytes Absolute: 0.5 10*3/uL (ref 0.1–1.0)
Monocytes Relative: 10 %
Neutro Abs: 3.1 10*3/uL (ref 1.7–7.7)
Neutrophils Relative %: 58 %
Platelet Count: 171 10*3/uL (ref 150–400)
RBC: 3.15 MIL/uL — ABNORMAL LOW (ref 3.87–5.11)
RDW: 11.5 % (ref 11.5–15.5)
WBC Count: 5.2 10*3/uL (ref 4.0–10.5)
nRBC: 0 % (ref 0.0–0.2)

## 2019-05-26 LAB — CMP (CANCER CENTER ONLY)
ALT: 15 U/L (ref 0–44)
AST: 19 U/L (ref 15–41)
Albumin: 4 g/dL (ref 3.5–5.0)
Alkaline Phosphatase: 72 U/L (ref 38–126)
Anion gap: 12 (ref 5–15)
BUN: 29 mg/dL — ABNORMAL HIGH (ref 8–23)
CO2: 26 mmol/L (ref 22–32)
Calcium: 9.3 mg/dL (ref 8.9–10.3)
Chloride: 99 mmol/L (ref 98–111)
Creatinine: 1.36 mg/dL — ABNORMAL HIGH (ref 0.44–1.00)
GFR, Est AFR Am: 43 mL/min — ABNORMAL LOW (ref >60)
GFR, Estimated: 37 mL/min — ABNORMAL LOW (ref >60)
Glucose, Bld: 117 mg/dL — ABNORMAL HIGH (ref 70–99)
Potassium: 3.3 mmol/L — ABNORMAL LOW (ref 3.5–5.1)
Sodium: 137 mmol/L (ref 135–145)
Total Bilirubin: 0.3 mg/dL (ref 0.3–1.2)
Total Protein: 6.9 g/dL (ref 6.5–8.1)

## 2019-05-26 LAB — MAGNESIUM: Magnesium: 0.8 mg/dL — CL (ref 1.7–2.4)

## 2019-05-26 MED ORDER — SODIUM CHLORIDE 0.9% FLUSH
10.0000 mL | INTRAVENOUS | Status: DC | PRN
  Administered 2019-05-26: 10 mL
  Filled 2019-05-26: qty 10

## 2019-05-26 MED ORDER — SODIUM CHLORIDE 0.9% FLUSH
10.0000 mL | Freq: Once | INTRAVENOUS | Status: AC
  Administered 2019-05-26: 10 mL
  Filled 2019-05-26: qty 10

## 2019-05-26 MED ORDER — TRASTUZUMAB-DKST CHEMO 150 MG IV SOLR
6.0000 mg/kg | Freq: Once | INTRAVENOUS | Status: AC
  Administered 2019-05-26: 378 mg via INTRAVENOUS
  Filled 2019-05-26: qty 18

## 2019-05-26 MED ORDER — HEPARIN SOD (PORK) LOCK FLUSH 100 UNIT/ML IV SOLN
500.0000 [IU] | Freq: Once | INTRAVENOUS | Status: AC | PRN
  Administered 2019-05-26: 500 [IU]
  Filled 2019-05-26: qty 5

## 2019-05-26 MED ORDER — DIPHENHYDRAMINE HCL 25 MG PO TABS
25.0000 mg | ORAL_TABLET | Freq: Once | ORAL | Status: AC
  Administered 2019-05-26: 25 mg via ORAL
  Filled 2019-05-26: qty 1

## 2019-05-26 MED ORDER — SODIUM CHLORIDE 0.9 % IV SOLN
4.0000 g | Freq: Once | INTRAVENOUS | Status: AC
  Administered 2019-05-26: 4 g via INTRAVENOUS
  Filled 2019-05-26: qty 8

## 2019-05-26 MED ORDER — SODIUM CHLORIDE 0.9 % IV SOLN
Freq: Once | INTRAVENOUS | Status: AC
  Administered 2019-05-26: 11:00:00 via INTRAVENOUS
  Filled 2019-05-26: qty 250

## 2019-05-26 MED ORDER — DIPHENHYDRAMINE HCL 25 MG PO CAPS
ORAL_CAPSULE | ORAL | Status: AC
  Filled 2019-05-26: qty 1

## 2019-05-26 MED ORDER — ACETAMINOPHEN 325 MG PO TABS
650.0000 mg | ORAL_TABLET | Freq: Once | ORAL | Status: AC
  Administered 2019-05-26: 650 mg via ORAL

## 2019-05-26 MED ORDER — ACETAMINOPHEN 325 MG PO TABS
ORAL_TABLET | ORAL | Status: AC
  Filled 2019-05-26: qty 2

## 2019-05-26 NOTE — Assessment & Plan Note (Signed)
She have no side effects from recent treatment so far Her blood counts are improving She will continue therapy/rehab for deconditioning We will continue treatment every 3 weeks She will be due for repeat imaging study at the end of the year

## 2019-05-26 NOTE — Assessment & Plan Note (Signed)
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 

## 2019-05-26 NOTE — Patient Instructions (Addendum)
Trastuzumab injection for infusion What is this medicine? TRASTUZUMAB (tras TOO zoo mab) is a monoclonal antibody. It is used to treat breast cancer and stomach cancer. This medicine may be used for other purposes; ask your health care provider or pharmacist if you have questions. COMMON BRAND NAME(S): [name], Herzuma, KANJINTI, Ogivri, Ontruzant, Trazimera What should I tell my health care provider before I take this medicine? They need to know if you have any of these conditions:  heart disease  heart failure  lung or breathing disease, like asthma  an unusual or allergic reaction to trastuzumab, benzyl alcohol, or other medications, foods, dyes, or preservatives  pregnant or trying to get pregnant  breast-feeding How should I use this medicine? This drug is given as an infusion into a vein. It is administered in a hospital or clinic by a specially trained health care professional. Talk to your pediatrician regarding the use of this medicine in children. This medicine is not approved for use in children. Overdosage: If you think you have taken too much of this medicine contact a poison control center or emergency room at once. NOTE: This medicine is only for you. Do not share this medicine with others. What if I miss a dose? It is important not to miss a dose. Call your doctor or health care professional if you are unable to keep an appointment. What may interact with this medicine? This medicine may interact with the following medications:  certain types of chemotherapy, such as daunorubicin, doxorubicin, epirubicin, and idarubicin This list may not describe all possible interactions. Give your health care provider a list of all the medicines, herbs, non-prescription drugs, or dietary supplements you use. Also tell them if you smoke, drink alcohol, or use illegal drugs. Some items may interact with your medicine. What should I watch for while using this medicine? Visit your  doctor for checks on your progress. Report any side effects. Continue your course of treatment even though you feel ill unless your doctor tells you to stop. Call your doctor or health care professional for advice if you get a fever, chills or sore throat, or other symptoms of a cold or flu. Do not treat yourself. Try to avoid being around people who are sick. You may experience fever, chills and shaking during your first infusion. These effects are usually mild and can be treated with other medicines. Report any side effects during the infusion to your health care professional. Fever and chills usually do not happen with later infusions. Do not become pregnant while taking this medicine or for 7 months after stopping it. Women should inform their doctor if they wish to become pregnant or think they might be pregnant. Women of child-bearing potential will need to have a negative pregnancy test before starting this medicine. There is a potential for serious side effects to an unborn child. Talk to your health care professional or pharmacist for more information. Do not breast-feed an infant while taking this medicine or for 7 months after stopping it. Women must use effective birth control with this medicine. What side effects may I notice from receiving this medicine? Side effects that you should report to your doctor or health care professional as soon as possible:  allergic reactions like skin rash, itching or hives, swelling of the face, lips, or tongue  chest pain or palpitations  cough  dizziness  feeling faint or lightheaded, falls  fever  general ill feeling or flu-like symptoms  signs of worsening heart failure like   breathing problems; swelling in your legs and feet  unusually weak or tired Side effects that usually do not require medical attention (report to your doctor or health care professional if they continue or are bothersome):  bone pain  changes in  taste  diarrhea  joint pain  nausea/vomiting  weight loss This list may not describe all possible side effects. Call your doctor for medical advice about side effects. You may report side effects to FDA at 1-800-FDA-1088. Where should I keep my medicine? This drug is given in a hospital or clinic and will not be stored at home. NOTE: This sheet is a summary. It may not cover all possible information. If you have questions about this medicine, talk to your doctor, pharmacist, or health care provider.  2020 Elsevier/Gold Standard (2016-06-25 14:37:52)  Hypomagnesemia Hypomagnesemia is a condition in which the level of magnesium in the blood is low. Magnesium is a mineral that is found in many foods. It is used in many different processes in the body. Hypomagnesemia can affect every organ in the body. In severe cases, it can cause life-threatening problems. What are the causes? This condition may be caused by:  Not getting enough magnesium in your diet.  Malnutrition.  Problems with absorbing magnesium from the intestines.  Dehydration.  Alcohol abuse.  Vomiting.  Severe or chronic diarrhea.  Some medicines, including medicines that make you urinate more (diuretics).  Certain diseases, such as kidney disease, diabetes, celiac disease, and overactive thyroid. What are the signs or symptoms? Symptoms of this condition include:  Loss of appetite.  Nausea and vomiting.  Involuntary shaking or trembling of a body part (tremor).  Muscle weakness.  Tingling in the arms and legs.  Sudden tightening of muscles (muscle spasms).  Confusion.  Psychiatric issues, such as depression, irritability, or psychosis.  A feeling of fluttering of the heart.  Seizures. These symptoms are more severe if magnesium levels drop suddenly. How is this diagnosed? This condition may be diagnosed based on:  Your symptoms and medical history.  A physical exam.  Blood and urine  tests. How is this treated? Treatment depends on the cause and the severity of the condition. It may be treated with:  A magnesium supplement. This can be taken in pill form. If the condition is severe, magnesium is usually given through an IV.  Changes to your diet. You may be directed to eat foods that have a lot of magnesium, such as green leafy vegetables, peas, beans, and nuts.  Stopping any intake of alcohol. Follow these instructions at home:      Make sure that your diet includes foods with magnesium. Foods that have a lot of magnesium in them include: ? Green leafy vegetables, such as spinach and broccoli. ? Beans and peas. ? Nuts and seeds, such as almonds and sunflower seeds. ? Whole grains, such as whole grain bread and fortified cereals.  Take magnesium supplements if your health care provider tells you to do that. Take them as directed.  Take over-the-counter and prescription medicines only as told by your health care provider.  Have your magnesium levels monitored as told by your health care provider.  When you are active, drink fluids that contain electrolytes.  Avoid drinking alcohol.  Keep all follow-up visits as told by your health care provider. This is important. Contact a health care provider if:  You get worse instead of better.  Your symptoms return. Get help right away if you:  Develop severe muscle weakness.  Have trouble breathing.  Feel that your heart is racing. Summary  Hypomagnesemia is a condition in which the level of magnesium in the blood is low.  Hypomagnesemia can affect every organ in the body.  Treatment may include eating more foods that contain magnesium, taking magnesium supplements, and not drinking alcohol.  Have your magnesium levels monitored as told by your health care provider. This information is not intended to replace advice given to you by your health care provider. Make sure you discuss any questions you have  with your health care provider. Document Released: 03/27/2005 Document Revised: 06/13/2017 Document Reviewed: 06/02/2017 Elsevier Patient Education  2020 [place] (COVID-19) Are you at risk?  Are you at risk for the Coronavirus (COVID-19)?  To be considered HIGH RISK for Coronavirus (COVID-19), you have to meet the following criteria:  . Traveled to [place], [place], [place], [place] or [place]; or in the [place] to [place], [place], [place], or [place]; and have fever, cough, and shortness of breath within the last 2 weeks of travel OR . Been in close contact with a person diagnosed with COVID-19 within the last 2 weeks and have fever, cough, and shortness of breath . IF YOU DO NOT MEET THESE CRITERIA, YOU ARE CONSIDERED LOW RISK FOR COVID-19.  What to do if you are HIGH RISK for COVID-19?  Marland Kitchen If you are having a medical emergency, call 911. . Seek medical care right away. Before you go to a doctor's office, urgent care or emergency department, call ahead and tell them about your recent travel, contact with someone diagnosed with COVID-19, and your symptoms. You should receive instructions from your physician's office regarding next steps of care.  . When you arrive at healthcare provider, tell the healthcare staff immediately you have returned from visiting [place], [place], [place], [place] or [place]; or traveled in the [place] to [place], [place], [place], or [place]; in the last two weeks or you have been in close contact with a person diagnosed with COVID-19 in the last 2 weeks.   . Tell the health care staff about your symptoms: fever, cough and shortness of breath. . After you have been seen by a medical provider, you will be either: o Tested for (COVID-19) and discharged home on quarantine except to seek medical care if symptoms worsen, and asked to  - Stay home and avoid contact with others until you get your results (4-5 days)  - Avoid  travel on public transportation if possible (such as bus, train, or airplane) or o Sent to the Emergency Department by EMS for evaluation, COVID-19 testing, and possible admission depending on your condition and test results.  What to do if you are LOW RISK for COVID-19?  Reduce your risk of any infection by using the same precautions used for avoiding the common cold or flu:  Marland Kitchen Wash your hands often with soap and warm water for at least 20 seconds.  If soap and water are not readily available, use an alcohol-based hand sanitizer with at least 60% alcohol.  . If coughing or sneezing, cover your mouth and nose by coughing or sneezing into the elbow areas of your shirt or coat, into a tissue or into your sleeve (not your hands). . Avoid shaking hands with others and consider head nods or verbal greetings only. . Avoid touching your eyes, nose, or mouth with unwashed hands.  . Avoid close contact with people who are sick. Marland Kitchen  Avoid places or events with large numbers of people in one location, like concerts or sporting events. . Carefully consider travel plans you have or are making. . If you are planning any travel outside or inside the [place], visit the CDC's Travelers' Health webpage for the latest health notices. . If you have some symptoms but not all symptoms, continue to monitor at home and seek medical attention if your symptoms worsen. . If you are having a medical emergency, call 911.   [place] / e-Visit: [email or web address]         [facility]: [facility]: [id number]                   [facility]: [phone]

## 2019-05-26 NOTE — Patient Instructions (Addendum)

## 2019-05-26 NOTE — Assessment & Plan Note (Signed)
She has recurrent, severe hypomagnesemia  She will receive IV magnesium replacement therapy and oral replacement therapy This is likely residual side effects from prior treatment We discussed oral magnesium replacement therapy of which she is not consistently taking it due to GI side effects but she will try to change the time she takes the magnesium replacement to see if that would help without major side effects

## 2019-05-26 NOTE — Telephone Encounter (Signed)
Per [facility], called with critical value Magnesium 0.8. Dr. Alvy Bimler made aware.

## 2019-05-26 NOTE — Progress Notes (Signed)
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, anemia secondary to prior chemotherapy as well as chronic kidney disease Anemia is improving/stable Recent vitamin B12 level was adequate She is not symptomatic Observe only  Hypomagnesemia She has recurrent, severe hypomagnesemia  She will receive IV magnesium replacement therapy and oral replacement therapy This is likely residual side effects from prior treatment We discussed oral magnesium replacement therapy of which she is not consistently taking it due to GI side effects but she will try to change the time she takes the magnesium replacement to see if that would help without major side effects   No orders of the defined types were placed in this encounter.   INTERVAL HISTORY: Please see below for problem oriented charting. She returns for [name] treatment She is doing well No recent infusion reaction Denies cough, chest pain or shortness of breath No leg swelling Her appetite is stable She is doing well with physical therapy and rehab She is not taking oral magnesium consistently due to diarrhea  SUMMARY OF ONCOLOGIC HISTORY: Oncology History Overview Note  MAGUADALUPE LATA  has a remote history of left  breast cancer at age 78 but received BRCA testing approximately 5 years ago which was negative. Her cancer was treated with surgery but no radiation or chemotherapy.   Serous endometrial cancer, MSI stable ER positive, PR neg, Her2/neu 3+      Endometrial cancer ([place])  08/29/2016 Pathology  Results   Endometrium, biopsy - HIGH GRADE ENDOMETRIAL CARCINOMA, SEE COMMENT. Microscopic Comment The sections show multiple fragments of adenocarcinoma displaying glandular and papillary patterns associated with high grade cytomorphology characterized by nuclear pleomorphism, prominent nucleoli and brisk mitosis. Immunohistochemical stains show that the tumor cells are positive for vimentin, p16, p53 with increased Ki-67 expression. Estrogen and progesterone receptor stains show patchy weak positivity. No significant positivity is seen with CEA. The findings are consistent with high grade endometrial carcinoma and the overall morphology and phenotypic features favor serous carcinoma.   08/29/2016 Initial Diagnosis   She presented with postmenopausal bleeding   10/03/2016 Imaging   CT C/A/P 09/2016 IMPRESSION: 1. Thickening of the endometrial canal up to 19 mm in fundus, presumably corresponding to the patient's reported endometrial carcinoma. 2. Multiple tiny pulmonary nodules scattered throughout the lungs bilaterally measuring 4 mm or less in size. Nodules of this size are typically considered statistically likely benign. In the setting of known primary malignancy, metastatic disease to the lungs is not excluded, but is not strongly favored on today's examination. Attention on followup studies is recommended to ensure the stability or resolution of these nodules. 3. Subcentimeter low-attenuation lesion in the central aspect of segment 8 of the liver is too small to characterize. This is statistically likely a tiny cyst, but warrants attention on follow-up studies to exclude the possibility of a solitary hepatic metastasis. 4. 1.5 x 1.5 x 1.7 cm well-circumscribed lesion in the proximal stomach. This is of uncertain etiology and significance, and could represent a benign gastric polyp, however, further evaluation with nonemergent endoscopy is suggested in the near future for further  evaluation. 5.  **An incidental finding of potential clinical significance has been found. 1.1 x 1.6 cm thyroid nodule in the inferior aspect of the right lobe of the thyroid gland. Follow-up evaluation with nonemergent thyroid ultrasound is recommended in the near future to better evaluate this finding. This recommendation follows ACR consensuss guidelines: Managing Incidental Thyroid Nodules Detected on Imaging: White Paper of the ACR Incidental Thyroid Findings Committee. J Am Coll Radiol 2015;12(2):143-150.** 6. Aortic atherosclerosis, in addition to left anterior descending coronary artery disease   10/22/2016 Surgery   Robotic assisted total hysterectomy, BSO and bilateral pelvic lymphadenectomy  Final pathology revealed a 3cm polyp containing serous carcinoma but with no myometrial invasion, no LVSI and negative nodes.  Stage IA Uterine serous cancer   10/22/2016 Pathology Results   1. Lymph nodes, regional resection, right pelvic - SIX BENIGN LYMPH NODES (0/6). 2. Lymph nodes, regional resection, left pelvic - SEVEN BENIGN LYMPH NODES (0/7). 3. Uterus +/- tubes/ovaries, neoplastic, with right ovary and fallopian tube ENDOMYOMETRIUM - SEROUS CARCINOMA ARISING WITHIN AN ENDOMETRIAL POLYP - NO MYOMETRIAL INVASION IDENTIFIED - ADENOMYOSIS - LEIOMYOMA (1 CM) - SEE ONCOLOGY TABLE AND COMMENT CERVIX - CARCINOMA FOCALLY INVOLVES ENDOCERVICAL GLANDS - NABOTHIAN CYSTS RIGHT ADNEXA - BENIGN OVARY AND FALLOPIAN TUBE - NO CARCINOMA IDENTIFIED 4. Cul-de-sac biopsy - MESOTHELIAL HYPERPLASIA Microscopic Comment 3. ONCOLOGY TABLE-UTERUS, CARCINOMA OR CARCINOSARCOMA Specimen: Uterus, right fallopian tube and ovary Procedure: Total hysterectomy and right salpingo-oophorectomy Lymph node sampling performed: Bilateral pelvic regional resection Specimen integrity: Intact Maximum tumor size: 3 cm (polyp) Histologic type: Serous carcinoma Grade: High grade Myometrial invasion: Not identified Cervical stromal  involvement: No, focal endocervical gland involvement Extent of involvement of other organs: Not identified Lymph - vascular invasion: Not identified Peritoneal washings: N/A Lymph nodes: Examined: 0 Sentinel 13 Non-sentinel 13 Total Lymph nodes with metastasis: 0 Isolated tumor cells (< 0.2 mm): 0 Micrometastasis: (> 0.2 mm and < 2.0 mm): 0 Macrometastasis: (> 2.0 mm): 0 Extracapsular extension: N/A Pelvic lymph nodes: 0 involved of 13 lymph nodes. Para-aortic lymph nodes: No para-aortic nodes submitted TNM code: pT1a, pNX FIGO Stage (based on pathologic findings, needs clinical correlation): IA Comment: Immunohistochemistry for cytokeratin AE1/AE3 is performed on all of the lymph nodes (parts 1 & 2) and no metastatic carcinoma is identified.   07/04/2017 Genetic Testing   The patient had genetic testing due to a personal history of breast and uterine cancer, and a family history of stomach cancer.  The Multi-Cancer Panel was ordered. The Multi-Cancer Panel offered by [facility] includes sequencing and/or deletion duplication testing of the following 83 genes: ALK, APC, ATM, AXIN2,BAP1,  BARD1, BLM, BMPR1A, BRCA1, BRCA2, BRIP1, CASR, CDC73, CDH1, CDK4, CDKN1B, CDKN1C, CDKN2A (p14ARF), CDKN2A (p16INK4a), CEBPA, CHEK2, CTNNA1, DICER1, DIS3L2, EGFR (c.2369C>T, p.Thr790Met variant only), EPCAM (Deletion/duplication testing only), FH, FLCN, GATA2, GPC3, GREM1 (Promoter region deletion/duplication testing only), HOXB13 (c.251G>A, p.Gly84Glu), HRAS, KIT, MAX, MEN1, MET, MITF (c.952G>A, p.Glu318Lys variant only), MLH1, MSH2, MSH3, MSH6, MUTYH, NBN, NF1, NF2, NTHL1, PALB2, PDGFRA, PHOX2B, PMS2, POLD1, POLE, POT1, PRKAR1A, PTCH1, PTEN, RAD50, RAD51C, RAD51D, RB1, RECQL4, RET, RUNX1, SDHAF2, SDHA (sequence changes only), SDHB, SDHC, SDHD, SMAD4, SMARCA4, SMARCB1, SMARCE1, STK11, SUFU, TERC, TERT, TMEM127, TP53, TSC1, TSC2, VHL, WRN and WT1.   Results: No pathogenic variants were identified.  A variant of  uncertain significance in the gene APC was identified.  c.791A>G (p.Gln264Arg).  The date of this test report is 07/04/2017.     Genetic Testing   Patient has genetic testing done for MSI.  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    12/03/2017 Imaging   CT Chest/Abd/Pelvis to follow pulmonary nodule and gastric mass IMPRESSION: 1. Stable CT of the chest. Small pulmonary nodules are unchanged when compared with previous exam. 2. No new findings identified. 3. Subcentimeter low-attenuation lesions within the liver are remain too small to characterize but are stable from prior exam. 4. Persistent indeterminate low-attenuation structure within the proximal stomach is unchanged measuring 1.4 cm. Correlation with direct visualization is advised   06/30/2018 Imaging   CT CHEST Lungs/Pleura: Stable scattered sub-cm pulmonary nodules are again seen bilaterally and are stable compared to previous studies. No new or enlarging pulmonary nodules or masses identified. No evidence of pulmonary infiltrate or pleural effusion.   08/19/2018 Echocardiogram   E[place] is done in [place]: EF 60-65%. Mild impaired relaxation. (report scanned)   09/17/2018 Relapse/Recurrence   Presented with c/o vagina discharge.  Lesion noted at the left vaginal apex 21m lesion removed Path c/w high grade serous cancer   09/17/2018 Pathology Results   Vagina, biopsy, left apex - HIGH GRADE SEROUS CARCINOMA.   09/28/2018 PET scan   1. Two hypermetabolic axial lymph nodes. Unusual site for metastatic endometrial carcinoma however the activity is more intense than typically seen in reactive adenopathy. Suggest ultrasound-guided percutaneous biopsy of the larger RIGHT axial lymph node 2. No evidence of local recurrence at the vaginal cuff.  3. No metastatic adenopathy in the abdomen or pelvis. 4. Stable small pulmonary nodules   10/09/2018 Pathology Results   Lymph node, needle/core biopsy, right  axilla - METASTATIC CARCINOMA, SEE COMMENT. Microscopic Comment The carcinoma appears high grade. Immunohistochemistry is positive for cytokeratin 7, PAX-8, and ER. Cytokeratin 20, CDX-2, PR, and GATA-3 are negative. The findings along with the history are consistent with a gynecologic primary.    10/09/2018 Procedure   Ultrasound-guided core biopsies of a right axillary lymph node.   10/14/2018 Cancer Staging   Staging form: Corpus Uteri - Carcinoma and Carcinosarcoma, AJCC 8th Edition - Clinical: Stage IVB (cT1, cN0, pM1) - Signed by GHeath Lark MD on 10/14/2018   10/19/2018 Imaging   Placement of single lumen port a cath via right internal jugular vein. The catheter tip lies at the cavo-atrial junction. A power injectable port a cath was placed and is ready for immediate use.    10/21/2018 -  Chemotherapy   The patient had carboplatin and taxol for treatment   11/11/2018 -  Chemotherapy   The patient had trastuzumab ([name]) 450 mg in sodium chloride 0.9 % 250 mL chemo infusion, 483 mg, Intravenous,  Once, 6 of 6 cycles Administration: 450 mg (11/11/2018), 378 mg (12/02/2018), 378 mg (02/03/2019), 378 mg (12/23/2018), 378 mg (01/13/2019), 378 mg (02/24/2019) trastuzumab-dkst (OGIVRI) 378 mg in sodium chloride 0.9 % 250 mL chemo infusion, 6 mg/kg = 378 mg (100 % of original dose 6 mg/kg), Intravenous,  Once, 3 of 6 cycles Dose modification: 6 mg/kg (original dose 6 mg/kg, Cycle 7, Reason: Other (see comments)) Administration: 378 mg (03/24/2019), 378 mg (04/14/2019), 378 mg (05/05/2019)  for chemotherapy treatment.    12/21/2018 PET scan   1. Interval resolution of hypermetabolic right axillary lymph nodes. No metabolic findings highly suspicious for recurrent metastatic disease. 2. New mild hypermetabolism within a borderline prominent portacaval lymph node, nonspecific. While a reactive node is favored, a lymph node metastasis cannot be entirely excluded. Suggest attention to this lymph node on  follow-up PET-CT in 3-6 months. 3. Nonspecific new  hypermetabolism at the ileocecal valve, more likely physiologic given absence of CT correlate. 4. Scattered subcentimeter pulmonary nodules are all stable and below PET resolution, more likely benign, continued CT surveillance advised. 5. Chronic findings include: Aortic Atherosclerosis (ICD10-I70.0). Marked diffuse colonic diverticulosis. Coronary atherosclerosis.    12/28/2018 Echocardiogram   1. The left ventricle has normal systolic function with an ejection fraction of 60-65%. The cavity size was normal. Left ventricular diastolic Doppler parameters are consistent with impaired relaxation.  2. GLS recorded as -10.7 but LV appears hyperdynamic and tracking of endocardium appears poor.  3. The right ventricle has normal systolic function. The cavity was normal. There is no increase in right ventricular wall thickness.  4. Mild thickening of the mitral valve leaflet.  5. The aortic valve was not well visualized. Mild thickening of the aortic valve. Aortic valve regurgitation is trivial by color flow Doppler.   03/23/2019 Imaging   PET 1. No findings of hypermetabolic residual/recurrent or metastatic disease. 2. Similar low-level hypermetabolism within normal sized portocaval and right inguinal nodes, favored to be reactive. 3. Ongoing stability of small bilateral pulmonary nodules, favored to be benign. Below PET resolution. 4. Coronary artery atherosclerosis. Aortic Atherosclerosis   03/26/2019 Echocardiogram   1. The left ventricle has normal systolic function, with an ejection fraction of 55-60%. The cavity size was normal. Left ventricular diastolic Doppler parameters are consistent with impaired relaxation.  2. The right ventricle has normal systolic function. The cavity was normal.  3. The mitral valve is abnormal. Mild thickening of the mitral valve leaflet. There is mild mitral annular calcification present.  4. The tricuspid valve  is grossly normal.  5. The aortic valve is tricuspid. Mild calcification of the aortic valve. No stenosis of the aortic valve.  6. The aorta is normal unless otherwise noted.  7. Normal LV systolic function; grade 1 diastolic dysfunction; XBM-84.1%.   Metastasis to lymph nodes ([place])  10/14/2018 Initial Diagnosis   Metastasis to lymph nodes ([place])   10/21/2018 - 03/16/2019 Chemotherapy   The patient had palonosetron (ALOXI) injection 0.25 mg, 0.25 mg, Intravenous,  Once, 7 of 7 cycles Administration: 0.25 mg (10/21/2018), 0.25 mg (11/11/2018), 0.25 mg (12/02/2018), 0.25 mg (12/23/2018), 0.25 mg (01/13/2019), 0.25 mg (02/03/2019), 0.25 mg (02/24/2019) CARBOplatin (PARAPLATIN) 310 mg in sodium chloride 0.9 % 250 mL chemo infusion, 310 mg, Intravenous,  Once, 7 of 7 cycles Dose modification: 300 mg (original dose 311.5 mg, Cycle 4, Reason: Dose not tolerated) Administration: 310 mg (10/21/2018), 300 mg (11/11/2018), 300 mg (12/02/2018), 300 mg (12/23/2018), 300 mg (01/13/2019), 300 mg (02/03/2019), 300 mg (02/24/2019) PACLitaxel (TAXOL) 222 mg in sodium chloride 0.9 % 250 mL chemo infusion (> 46m/m2), 135 mg/m2 = 222 mg, Intravenous,  Once, 7 of 7 cycles Administration: 222 mg (10/21/2018), 222 mg (11/11/2018), 222 mg (12/02/2018), 222 mg (12/23/2018), 222 mg (01/13/2019), 222 mg (02/03/2019), 222 mg (02/24/2019) fosaprepitant (EMEND) 150 mg, dexamethasone (DECADRON) 12 mg in sodium chloride 0.9 % 145 mL IVPB, , Intravenous,  Once, 7 of 7 cycles Administration:  (10/21/2018),  (11/11/2018),  (12/02/2018),  (12/23/2018),  (01/13/2019),  (02/03/2019),  (02/24/2019)  for chemotherapy treatment.      REVIEW OF SYSTEMS:   Constitutional: Denies fevers, chills or abnormal weight loss Eyes: Denies blurriness of vision Ears, nose, mouth, throat, and face: Denies mucositis or sore throat Respiratory: Denies cough, dyspnea or wheezes Cardiovascular: Denies palpitation, chest discomfort or lower extremity swelling Gastrointestinal:  Denies  nausea, heartburn or change in bowel habits Skin: Denies abnormal  skin rashes Lymphatics: Denies new lymphadenopathy or easy bruising Neurological:Denies numbness, tingling or new weaknesses Behavioral/Psych: Mood is stable, no new changes  All other systems were reviewed with the patient and are negative.  I have reviewed the past medical history, past surgical history, social history and family history with the patient and they are unchanged from previous note.  ALLERGIES:  has No Known Allergies.  MEDICATIONS:  Current Outpatient Medications  Medication Sig Dispense Refill  . acetaminophen (TYLENOL) 500 MG tablet Take 1,000 mg by mouth every 6 (six) hours as needed.    Marland Kitchen aspirin EC 81 MG tablet Take 81 mg by mouth daily.    Marland Kitchen atorvastatin (LIPITOR) 20 MG tablet Take 20 mg by mouth every evening.     . Calcium Carbonate-Vitamin D (CALCIUM-VITAMIN D) 500-200 MG-UNIT per tablet Take 1 tablet by mouth daily.    . citalopram (CELEXA) 20 MG tablet Take 20 mg by mouth at bedtime.     Marland Kitchen levothyroxine (SYNTHROID, LEVOTHROID) 100 MCG tablet Take 100 mcg by mouth daily before breakfast.     . lidocaine-prilocaine (EMLA) cream Apply to affected area once 30 g 3  . losartan-hydrochlorothiazide (HYZAAR) 100-25 MG tablet     . magnesium oxide (MAG-OX) 400 (241.3 Mg) MG tablet Take 1 tablet (400 mg total) by mouth daily. 30 tablet 11  . metoprolol succinate (TOPROL-XL) 25 MG 24 hr tablet Take 25 mg by mouth daily.    . Multiple Vitamin (MULTIVITAMIN WITH MINERALS) TABS Take 1 tablet by mouth daily.    . ondansetron (ZOFRAN) 8 MG tablet Take 1 tablet (8 mg total) by mouth every 8 (eight) hours as needed. Start on the third day after chemotherapy. 30 tablet 1  . pantoprazole (PROTONIX) 40 MG tablet Take 40 mg by mouth every morning.     . valACYclovir (VALTREX) 1000 MG tablet valacyclovir 1 gram tablet     No current facility-administered medications for this visit.     PHYSICAL EXAMINATION: ECOG  PERFORMANCE STATUS: 1 - Symptomatic but completely ambulatory  Vitals:   05/26/19 0956  BP: 130/66  Pulse: 74  Resp: 16  Temp: 98 F (36.7 C)  SpO2: 97%   Filed Weights   05/26/19 0956  Weight: 133 lb 4.8 oz (60.5 kg)    GENERAL:alert, no distress and comfortable SKIN: skin color, texture, turgor are normal, no rashes or significant lesions EYES: normal, Conjunctiva are pink and non-injected, sclera clear OROPHARYNX:no exudate, no erythema and lips, buccal mucosa, and tongue normal  NECK: supple, thyroid normal size, non-tender, without nodularity LYMPH:  no palpable lymphadenopathy in the cervical, axillary or inguinal LUNGS: clear to auscultation and percussion with normal breathing effort HEART: regular rate & rhythm and no murmurs and no lower extremity edema ABDOMEN:abdomen soft, non-tender and normal bowel sounds Musculoskeletal:no cyanosis of digits and no clubbing  NEURO: alert & oriented x 3 with fluent speech, no focal motor/sensory deficits  LABORATORY DATA:  I have reviewed the data as listed    Component Value Date/Time   NA 139 05/05/2019 0952   NA 139 02/13/2017 1512   K 3.4 (L) 05/05/2019 0952   K 4.2 02/13/2017 1512   CL 101 05/05/2019 0952   CO2 26 05/05/2019 0952   CO2 28 02/13/2017 1512   GLUCOSE 101 (H) 05/05/2019 0952   GLUCOSE 118 02/13/2017 1512   BUN 24 (H) 05/05/2019 0952   BUN 25.2 02/13/2017 1512   CREATININE 1.13 (H) 05/05/2019 0952   CREATININE 1.2 (H) 02/13/2017  1512   CALCIUM 9.4 05/05/2019 0952   CALCIUM 10.0 02/13/2017 1512   PROT 6.6 05/05/2019 0952   ALBUMIN 3.9 05/05/2019 0952   AST 19 05/05/2019 0952   ALT 15 05/05/2019 0952   ALKPHOS 69 05/05/2019 0952   BILITOT 0.3 05/05/2019 0952   GFRNONAA 46 (L) 05/05/2019 0952   GFRAA 54 (L) 05/05/2019 0952    No results found for: SPEP, UPEP  Lab Results  Component Value Date   WBC 5.2 05/26/2019   NEUTROABS 3.1 05/26/2019   HGB 10.6 (L) 05/26/2019   HCT 29.9 (L) 05/26/2019    MCV 94.9 05/26/2019   PLT 171 05/26/2019      Chemistry      Component Value Date/Time   NA 139 05/05/2019 0952   NA 139 02/13/2017 1512   K 3.4 (L) 05/05/2019 0952   K 4.2 02/13/2017 1512   CL 101 05/05/2019 0952   CO2 26 05/05/2019 0952   CO2 28 02/13/2017 1512   BUN 24 (H) 05/05/2019 0952   BUN 25.2 02/13/2017 1512   CREATININE 1.13 (H) 05/05/2019 0952   CREATININE 1.2 (H) 02/13/2017 1512      Component Value Date/Time   CALCIUM 9.4 05/05/2019 0952   CALCIUM 10.0 02/13/2017 1512   ALKPHOS 69 05/05/2019 0952   AST 19 05/05/2019 0952   ALT 15 05/05/2019 0952   BILITOT 0.3 05/05/2019 0952      All questions were answered. The patient knows to call the clinic with any problems, questions or concerns. No barriers to learning was detected.  I spent 15 minutes counseling the patient face to face. The total time spent in the appointment was 20 minutes and more than 50% was on counseling and review of test results  [name], MD 05/26/2019 10:02 AM

## 2019-05-27 ENCOUNTER — Ambulatory Visit: Payer: Medicare HMO

## 2019-05-27 DIAGNOSIS — C541 Malignant neoplasm of endometrium: Secondary | ICD-10-CM | POA: Diagnosis not present

## 2019-05-27 DIAGNOSIS — R2689 Other abnormalities of gait and mobility: Secondary | ICD-10-CM | POA: Diagnosis not present

## 2019-05-27 DIAGNOSIS — R5381 Other malaise: Secondary | ICD-10-CM

## 2019-05-27 DIAGNOSIS — M6281 Muscle weakness (generalized): Secondary | ICD-10-CM

## 2019-05-27 NOTE — Therapy (Signed)
[place], [place], [address] Phone: [phone]   Fax:  [phone]  Physical Therapy Treatment  Patient Details  Name: Vicki Cervantes MRN: [id number] Date of Birth: 1940-09-02 Referring Provider (PT): [name]   Encounter Date: 05/27/2019  PT End of Session - 05/27/19 1107    Visit Number  12    Number of Visits  17    Date for PT Re-Evaluation  06/01/19    PT Start Time  1103    PT Stop Time  1156    PT Time Calculation (min)  53 min    Activity Tolerance  Patient tolerated treatment well    Behavior During Therapy  [facility] for tasks assessed/performed       Past Medical History:  Diagnosis Date  . Aortic atherosclerosis ([place])   . Arthritis   . Blood transfusion without reported diagnosis   . Breast cancer ([place])     LEFT, '82-left breast cancer-surgery only  . Cataract    removed both eyes  . Complication of anesthesia    nausea  . Diverticulosis   . Esophageal stricture   . Family history of stomach cancer   . GERD (gastroesophageal reflux disease)   . Hemorrhoids   . Hiatal hernia   . High triglycerides   . Hypercholesteremia   . Hypertension   . Hypothyroidism   . Osteoarthritis   . PONV (postoperative nausea and vomiting)   . Uterine cancer [facility])     Past Surgical History:  Procedure Laterality Date  . COLONOSCOPY    . EUS N/A 01/07/2013   Procedure: UPPER ENDOSCOPIC ULTRASOUND (EUS) LINEAR;  Surgeon: Milus Banister, MD;  Location: [facility] ENDOSCOPY;  Service: Endoscopy;  Laterality: N/A;  . EUS N/A 01/05/2015   Procedure: UPPER ENDOSCOPIC ULTRASOUND (EUS) LINEAR;  Surgeon: Milus Banister, MD;  Location: [facility] ENDOSCOPY;  Service: Endoscopy;  Laterality: N/A;  . FOOT SURGERY Left 2009-2010   x2. Surgery in 2011  . IR IMAGING GUIDED PORT INSERTION  10/19/2018  . LEFT HEART CATHETERIZATION WITH CORONARY ANGIOGRAM N/A 01/24/2014   Procedure: LEFT HEART CATHETERIZATION WITH CORONARY  ANGIOGRAM;  Surgeon: Peter M Martinique, MD;  Location: [facility] CATH LAB;  Service: Cardiovascular;  Laterality: N/A;  . LUMBAR [place] SURGERY  01-01-13  . MASTECTOMY, RADICAL Left 1982  . OVARY SURGERY  1972-1973  . ROBOTIC ASSISTED TOTAL HYSTERECTOMY WITH BILATERAL SALPINGO OOPHERECTOMY N/A 10/22/2016   Procedure: XI ROBOTIC ASSISTED TOTAL HYSTERECTOMY WITH RIGHT SALPINGO OOPHORECTOMY; INJECTION OF GREEN DYE WITH EXCISION OF BILATERAL PELVIC LYMPH NODES;  Surgeon: Janie Morning, MD;  Location: [facility] ORS;  Service: Gynecology;  Laterality: N/A;  . ROTATOR CUFF REPAIR Left   . SENTINEL NODE BIOPSY N/A 10/22/2016   Procedure: SENTINEL NODE BIOPSY;  Surgeon: Janie Morning, MD;  Location: [facility] ORS;  Service: Gynecology;  Laterality: N/A;  . SIMPLE MASTECTOMY Left 1982  . TOTAL KNEE ARTHROPLASTY Bilateral 2009-2010   x2  . UPPER GASTROINTESTINAL ENDOSCOPY      There were no vitals filed for this visit.  Subjective Assessment - 05/27/19 1108    Subjective  Pt states that she had to get her treatment yesterday and the benadryl made her really tired so she was unable to perform her new HEP at this time.    Pertinent History  Pt has a history of L radical mastectomy when she was 41 and R simple mastectomy. No previous signs of edema in her B arms. Pt had a  hysterectomy 2 years ago due to endometrial cancer which returned this year.    Currently in Pain?  No/denies                       [facility] Adult PT Treatment/Exercise - 05/27/19 0001      Neuro Re-ed    Neuro Re-ed Details   gait with arm swing alt/arm leg during gait for 2 laps in the hall, alt arm swing with foot tap on 6" step, Step up on 6" step with pause 2-3 seconds 10x Bil with UE support only on the L side , side step on 6" step with out UE support, standing SLS w/CGA and occasional Min A from physical therapist with swinging alt/arm leg with VC for a focus on the stance foot for proprioceptive input, rocking fwd/back and R/L on the flat  side BOSU w/SBA and squats on flat side BOSU w/o UE supportand with SBA this session.       Knee/Hip Exercises: Seated   Other Seated Knee/Hip Exercises  seated on the swiss ball with pelvic tilt and back arch to activate transverse abdominus and glutes alteranting then R/L with lateral pelvic shift to decrease one plane movement when walking prior to walking activities with alt arm swing.       Knee/Hip Exercises: Supine   Bridges  Strengthening;Both;20 reps    Bridges Limitations  legs wide for increased glute activation this session w/VC for correct foot/knee position.     Straight Leg Raise with External Rotation  Strengthening;Right;Left;10 reps;2 sets    Straight Leg Raise with External Rotation Limitations  pt had good form this session.     Other Supine Knee/Hip Exercises  Modified dead but with alt foot down during other leg movement VC for tight transverse abdominus throughout and for alt legs 20x alternating for 2 sets with occsaional VC for transverse abdominus activation which pt was able to correct.              PT Education - 05/27/19 1158    Education Details  pt will continue with current HEP    Person(s) Educated  Patient    Methods  Explanation    Comprehension  Verbalized understanding       PT Short Term Goals - 05/04/19 1108      PT SHORT TERM GOAL #1   Title  Pt will be able to state precautions and risk factors for lymphedema within 4 weeks in order to decrease risk for lymphedema in the future related to lymphnode removal and surgery.    Baseline  Pt remembers what lymphedema is and is able to ask if leggings are an appropriate clothing for lymphedema prevention    Time  4    Period  Weeks    Status  On-going    Target Date  05/10/19        PT Long Term Goals - 05/04/19 1110      PT LONG TERM GOAL #1   Title  Pt will perform 52/56 on the BERG balance test in order to demonstrate a decrease risk for falls.    Baseline  50/56    Time  4    Period   Weeks    Status  On-going    Target Date  06/08/19      PT LONG TERM GOAL #2   Title  The patient will maintain single limb stance x 10 seconds bilaterally.    Baseline  RLE  7 seconds, LLE 2 seconds with multiple attempts    Time  4    Period  Weeks    Status  On-going    Target Date  06/08/19      PT LONG TERM GOAL #3   Title  The patient will return demo HEP for core strength, L hip stretching/strengthening and high level balance.    Baseline  Pt is working on HEP at home.    Time  4    Period  Weeks    Status  On-going    Target Date  06/08/19            Plan - 05/27/19 1107    Clinical Impression Statement  Pt continues to tolerate an increase in difficulty with gait/balance activities in order to decrease fall risk. She is requiring less and less assist for high level balance activities. Focus this session on alt arm swing with heel strike during gait in order to balance out COM during gait and to decrease stiffness at the trunk/hips during gait for improved balance reactions. Pt will benefit from continued POC.    Personal Factors and Comorbidities  Comorbidity 3+    Comorbidities  CKD stage III, hx breast cancer, endometrial cancer    Examination-Activity Limitations  Other;Locomotion Level    PT Frequency  2x / week    PT Duration  4 weeks    PT Treatment/Interventions  ADLs/Self Care Home Management;Electrical Stimulation;Gait training;Stair training;Functional mobility training;Therapeutic activities;Neuromuscular re-education;Balance training;Therapeutic exercise;Patient/family education;Manual techniques;Manual lymph drainage;Compression bandaging;Passive range of motion;Taping;Vasopneumatic Device;Joint Manipulations    PT Next Visit Plan  Progress note next session, Continue with increased difficulty w/gait and dynamic balance activities    PT Home Exercise Plan  Access Code: [email or web address]       Patient will benefit from skilled therapeutic intervention in order to  improve the following deficits and impairments:  Abnormal gait, Decreased balance, Decreased endurance, Dizziness, Decreased activity tolerance, Improper body mechanics  Visit Diagnosis: Endometrial cancer ([place])  Muscle weakness (generalized)  Other abnormalities of gait and mobility  Physical debility     Problem List Patient Active Problem List   Diagnosis Date Noted  . Deficiency anemia 03/24/2019  . Hypokalemia due to inadequate potassium intake 03/24/2019  . Hypomagnesemia 03/24/2019  . Physical debility 03/24/2019  . Pancytopenia, acquired ([place]) 02/03/2019  . 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 Bone pain 11/10/2018  . Encounter for antineoplastic chemotherapy 10/20/2018  . 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 Goals of care, counseling/discussion 10/14/2018  . CKD (chronic kidney disease), stage III 10/14/2018  . Genetic testing 07/14/2017  . History of breast cancer 06/25/2017  . Family history of stomach cancer   . Vaginal mass 02/13/2017  . Thyroid nodule 02/13/2017  . Pulmonary nodules/lesions, multiple 02/13/2017  . Endometrial cancer ([place]) 09/30/2016  . HTN (hypertension) 01/17/2014  . Chest pain at rest 01/17/2014  . Hyperlipidemia 04/03/2011  . OTHER SPECIFIED DISORDER OF STOMACH AND DUODENUM 10/19/2009    Ander Purpura, PT 05/27/2019, 12:01 PM  [place], [place], [address] Phone: [phone]   Fax:  [phone]  Name: Vicki Cervantes MRN: [id number] Date of Birth: May 10, 1941

## 2019-06-01 ENCOUNTER — Other Ambulatory Visit: Payer: Self-pay

## 2019-06-01 ENCOUNTER — Ambulatory Visit: Payer: Medicare HMO

## 2019-06-01 DIAGNOSIS — C541 Malignant neoplasm of endometrium: Secondary | ICD-10-CM

## 2019-06-01 DIAGNOSIS — R5381 Other malaise: Secondary | ICD-10-CM | POA: Diagnosis not present

## 2019-06-01 DIAGNOSIS — R2689 Other abnormalities of gait and mobility: Secondary | ICD-10-CM | POA: Diagnosis not present

## 2019-06-01 DIAGNOSIS — M6281 Muscle weakness (generalized): Secondary | ICD-10-CM

## 2019-06-01 NOTE — Therapy (Signed)
[place], [place], [address] Phone: [phone]   Fax:  [phone]  Physical Therapy Treatment  Patient Details  Name: Vicki Cervantes MRN: [id number] Date of Birth: 12-Nov-1940 Referring Provider (PT): [name]   Encounter Date: 06/01/2019  PT End of Session - 06/01/19 1059    Visit Number  13    Number of Visits  17    Date for PT Re-Evaluation  06/01/19    PT Start Time  1100    PT Stop Time  1156    PT Time Calculation (min)  56 min    Activity Tolerance  Patient tolerated treatment well    Behavior During Therapy  [facility] for tasks assessed/performed       Past Medical History:  Diagnosis Date  . Aortic atherosclerosis ([place])   . Arthritis   . Blood transfusion without reported diagnosis   . Breast cancer ([place])     LEFT, '82-left breast cancer-surgery only  . Cataract    removed both eyes  . Complication of anesthesia    nausea  . Diverticulosis   . Esophageal stricture   . Family history of stomach cancer   . GERD (gastroesophageal reflux disease)   . Hemorrhoids   . Hiatal hernia   . High triglycerides   . Hypercholesteremia   . Hypertension   . Hypothyroidism   . Osteoarthritis   . PONV (postoperative nausea and vomiting)   . Uterine cancer [facility])     Past Surgical History:  Procedure Laterality Date  . COLONOSCOPY    . EUS N/A 01/07/2013   Procedure: UPPER ENDOSCOPIC ULTRASOUND (EUS) LINEAR;  Surgeon: Milus Banister, MD;  [facility];  Service: Endoscopy;  Laterality: N/A;  . EUS N/A 01/05/2015   Procedure: UPPER ENDOSCOPIC ULTRASOUND (EUS) LINEAR;  Surgeon: Milus Banister, MD;  [facility];  Service: Endoscopy;  Laterality: N/A;  . FOOT SURGERY Left 2009-2010   x2. Surgery in 2011  . IR IMAGING GUIDED PORT INSERTION  10/19/2018  . LEFT HEART CATHETERIZATION WITH CORONARY ANGIOGRAM N/A 01/24/2014   Procedure: LEFT HEART CATHETERIZATION WITH CORONARY  ANGIOGRAM;  Surgeon: Peter M Martinique, MD;  Location: [facility] CATH LAB;  Service: Cardiovascular;  Laterality: N/A;  . LUMBAR [place] SURGERY  01-01-13  . MASTECTOMY, RADICAL Left 1982  . OVARY SURGERY  1972-1973  . ROBOTIC ASSISTED TOTAL HYSTERECTOMY WITH BILATERAL SALPINGO OOPHERECTOMY N/A 10/22/2016   Procedure: XI ROBOTIC ASSISTED TOTAL HYSTERECTOMY WITH RIGHT SALPINGO OOPHORECTOMY; INJECTION OF GREEN DYE WITH EXCISION OF BILATERAL PELVIC LYMPH NODES;  Surgeon: Janie Morning, MD;  [facility];  Service: Gynecology;  Laterality: N/A;  . ROTATOR CUFF REPAIR Left   . SENTINEL NODE BIOPSY N/A 10/22/2016   Procedure: SENTINEL NODE BIOPSY;  Surgeon: Janie Morning, MD;  [facility];  Service: Gynecology;  Laterality: N/A;  . SIMPLE MASTECTOMY Left 1982  . TOTAL KNEE ARTHROPLASTY Bilateral 2009-2010   x2  . UPPER GASTROINTESTINAL ENDOSCOPY      There were no vitals filed for this visit.  Subjective Assessment - 06/01/19 1100    Subjective  Pt states that she has been doing her exercises and feels that she is getting better at standing on one leg but she would like to be better.    Pertinent History  Pt has a history of L radical mastectomy when she was 41 and R simple mastectomy. No previous signs of edema in her B arms. Pt had a hysterectomy  2 years ago due to endometrial cancer which returned this year.    Currently in Pain?  No/denies    Pain Score  0-No pain         [facility] PT Assessment - 06/01/19 0001      Standardized Balance Assessment   Standardized Balance Assessment  Berg Balance Test      Berg Balance Test   Sit to Stand  Able to stand without using hands and stabilize independently    Standing Unsupported  Able to stand safely 2 minutes    Sitting with Back Unsupported but Feet Supported on Floor or Stool  Able to sit safely and securely 2 minutes    Stand to Sit  Sits safely with minimal use of hands    Transfers  Able to transfer safely, minor use of hands    Standing  Unsupported with Eyes Closed  Able to stand 10 seconds with supervision    Standing Unsupported with Feet Together  Able to place feet together independently and stand 1 minute safely    From Standing, Reach Forward with Outstretched Arm  Can reach confidently >25 cm (10")    From Standing Position, Pick up Object from Floor  Able to pick up shoe safely and easily    From Standing Position, Turn to Look Behind Over each Shoulder  Looks behind from both sides and weight shifts well    Turn 360 Degrees  Able to turn 360 degrees safely in 4 seconds or less    Standing Unsupported, Alternately Place Feet on Step/Stool  Able to complete 4 steps without aid or supervision    Standing Unsupported, One Foot in Front  Able to take small step independently and hold 30 seconds    Standing on One Leg  Able to lift leg independently and hold 5-10 seconds    Total Score  50                   [facility] Adult PT Treatment/Exercise - 06/01/19 0001      Neuro Re-ed    Neuro Re-ed Details   standing rhomber on airex 3x 30 seconds, significant loss of balance inititally that improved with repetitions/time and light tactile input form physical therapist, Pt then performed 30 seconds with eyes closed on the solid surface independently for 30 seconds with significant decrease in sway.       Knee/Hip Exercises: Supine   Bridges  Strengthening;Both;20 reps    Bridges Limitations  legs wide for increased glute activation this session w/VC for correct foot/knee position.     Straight Leg Raise with External Rotation  Strengthening;Right;Left;10 reps;2 sets    Other Supine Knee/Hip Exercises  Modified dead but with alt foot down during other leg movement VC for tight transverse abdominus throughout and for alt legs 20x alternating for 1 set, Just LE dead bug with legs in table top position 20x alt                PT Short Term Goals - 06/01/19 1104      PT SHORT TERM GOAL #1   Title  Pt will be able  to state precautions and risk factors for lymphedema within 4 weeks in order to decrease risk for lymphedema in the future related to lymphnode removal and surgery.    Baseline  Pt was able to state that she would elevate her legs to help decrease her risk    Time  2    Period  Weeks    Status  On-going        PT Long Term Goals - 06/01/19 1107      PT LONG TERM GOAL #1   Title  Pt will perform 52/56 on the BERG balance test in order to demonstrate a decrease risk for falls.    Baseline  50/56 pt has difficulty with tandem standing, altnate stepping and standing with eyes closed.    Time  4    Period  Weeks    Status  On-going    Target Date  06/29/19      PT LONG TERM GOAL #2   Baseline  RLE 7 seconds with 1 attempt, LLE 10 seconds continuously with 5 attempts    Time  4    Period  Weeks    Status  New    Target Date  06/29/19      PT LONG TERM GOAL #3   Title  The patient will return demo HEP for core strength, L hip stretching/strengthening and high level balance.    Baseline  Pt is working on HEP at home.    Time  4    Period  Weeks    Status  On-going    Target Date  06/29/19            Plan - 06/01/19 1059    Clinical Impression Statement  Pt presents to physical therapy this session and is continuing to progress though slower than from her initial evaluation to today. She has met most of her goals but is at 50/56 on the BERG keeping her at Moderate fall risk. She lost points mostly with tandem stance due to she used UE support even with cueing to get into full position but is able to perform partial tandem and get into this position w/o UE support. She also had difficulty with standing with eyes closed. She demonstrated significant improvement in kinesthetic awareness following practice on unstable surface with eyes closed to stable surface with eyes closed. Pt significantly improved reaching and single leg stance activities form her last progress note. Pt will  benefit from 1x/week for 4 more weeks of physical therapy to address noted deficits to decrease risk for falls.    Personal Factors and Comorbidities  Comorbidity 3+    Comorbidities  CKD stage III, hx breast cancer, endometrial cancer    Examination-Activity Limitations  Other;Locomotion Level    PT Frequency  1x / week    PT Duration  4 weeks    PT Treatment/Interventions  ADLs/Self Care Home Management;Electrical Stimulation;Gait training;Stair training;Functional mobility training;Therapeutic activities;Neuromuscular re-education;Balance training;Therapeutic exercise;Patient/family education;Manual techniques;Manual lymph drainage;Compression bandaging;Passive range of motion;Taping;Vasopneumatic Device;Joint Manipulations    PT Next Visit Plan  Perform eyes closed activities, progress core. Continue with increased difficulty w/gait and dynamic balance activities    PT Home Exercise Plan  Access Code: [email or web address]    Consulted and Agree with Plan of Care  Patient       Patient will benefit from skilled therapeutic intervention in order to improve the following deficits and impairments:  Abnormal gait, Decreased balance, Decreased endurance, Dizziness, Decreased activity tolerance, Improper body mechanics  Visit Diagnosis: Muscle weakness (generalized)  Endometrial cancer ([facility])  Other abnormalities of gait and mobility  Physical debility     Problem List Patient Active Problem List   Diagnosis Date Noted  . Deficiency anemia 03/24/2019  . Hypokalemia due to inadequate potassium intake 03/24/2019  . Hypomagnesemia 03/24/2019  . Physical debility 03/24/2019  .  Pancytopenia, acquired ([place]) 02/03/2019  . 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 Bone pain 11/10/2018  . Encounter for antineoplastic chemotherapy 10/20/2018  . 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 Goals of care, counseling/discussion 10/14/2018  . CKD (chronic kidney disease), stage III 10/14/2018  . Genetic  testing 07/14/2017  . History of breast cancer 06/25/2017  . Family history of stomach cancer   . Vaginal mass 02/13/2017  . Thyroid nodule 02/13/2017  . Pulmonary nodules/lesions, multiple 02/13/2017  . Endometrial cancer ([place]) 09/30/2016  . HTN (hypertension) 01/17/2014  . Chest pain at rest 01/17/2014  . Hyperlipidemia 04/03/2011  . OTHER SPECIFIED DISORDER OF STOMACH AND DUODENUM 10/19/2009    Ander Purpura, PT 06/01/2019, 12:01 PM  [place], [place], [address] Phone: [phone]   Fax:  [phone]  Name: Vicki Cervantes MRN: [id number] Date of Birth: [birth year]

## 2019-06-03 ENCOUNTER — Telehealth: Payer: Self-pay

## 2019-06-03 DIAGNOSIS — H26491 Other secondary cataract, right eye: Secondary | ICD-10-CM | POA: Diagnosis not present

## 2019-06-03 NOTE — Telephone Encounter (Signed)
Called pt and let her know that she missed her visit at 11 am today.   Tomma Rakers DPT

## 2019-06-15 DIAGNOSIS — E038 Other specified hypothyroidism: Secondary | ICD-10-CM | POA: Diagnosis not present

## 2019-06-15 DIAGNOSIS — E7849 Other hyperlipidemia: Secondary | ICD-10-CM | POA: Diagnosis not present

## 2019-06-16 ENCOUNTER — Encounter: Payer: Self-pay | Admitting: Hematology and Oncology

## 2019-06-16 ENCOUNTER — Inpatient Hospital Stay: Payer: Medicare HMO

## 2019-06-16 ENCOUNTER — Inpatient Hospital Stay (HOSPITAL_BASED_OUTPATIENT_CLINIC_OR_DEPARTMENT_OTHER): Payer: Medicare HMO | Admitting: Hematology and Oncology

## 2019-06-16 ENCOUNTER — Other Ambulatory Visit: Payer: Self-pay

## 2019-06-16 ENCOUNTER — Telehealth: Payer: Self-pay | Admitting: Hematology and Oncology

## 2019-06-16 ENCOUNTER — Inpatient Hospital Stay: Payer: Medicare HMO | Attending: Gynecologic Oncology

## 2019-06-16 VITALS — BP 132/68 | HR 72 | Temp 97.8°F | Resp 18 | Ht 62.2 in | Wt 131.5 lb

## 2019-06-16 DIAGNOSIS — C541 Malignant neoplasm of endometrium: Secondary | ICD-10-CM | POA: Insufficient documentation

## 2019-06-16 DIAGNOSIS — Z9079 Acquired absence of other genital organ(s): Secondary | ICD-10-CM | POA: Diagnosis not present

## 2019-06-16 DIAGNOSIS — Z79899 Other long term (current) drug therapy: Secondary | ICD-10-CM | POA: Insufficient documentation

## 2019-06-16 DIAGNOSIS — Z5112 Encounter for antineoplastic immunotherapy: Secondary | ICD-10-CM | POA: Diagnosis present

## 2019-06-16 DIAGNOSIS — R918 Other nonspecific abnormal finding of lung field: Secondary | ICD-10-CM | POA: Diagnosis not present

## 2019-06-16 DIAGNOSIS — Z5111 Encounter for antineoplastic chemotherapy: Secondary | ICD-10-CM | POA: Diagnosis not present

## 2019-06-16 DIAGNOSIS — E876 Hypokalemia: Secondary | ICD-10-CM

## 2019-06-16 DIAGNOSIS — C773 Secondary and unspecified malignant neoplasm of axilla and upper limb lymph nodes: Secondary | ICD-10-CM | POA: Insufficient documentation

## 2019-06-16 DIAGNOSIS — Z853 Personal history of malignant neoplasm of breast: Secondary | ICD-10-CM | POA: Insufficient documentation

## 2019-06-16 DIAGNOSIS — N183 Chronic kidney disease, stage 3 unspecified: Secondary | ICD-10-CM | POA: Insufficient documentation

## 2019-06-16 DIAGNOSIS — D630 Anemia in neoplastic disease: Secondary | ICD-10-CM

## 2019-06-16 DIAGNOSIS — Z9221 Personal history of antineoplastic chemotherapy: Secondary | ICD-10-CM | POA: Diagnosis not present

## 2019-06-16 DIAGNOSIS — Z90710 Acquired absence of both cervix and uterus: Secondary | ICD-10-CM | POA: Insufficient documentation

## 2019-06-16 DIAGNOSIS — Z90722 Acquired absence of ovaries, bilateral: Secondary | ICD-10-CM | POA: Insufficient documentation

## 2019-06-16 DIAGNOSIS — I10 Essential (primary) hypertension: Secondary | ICD-10-CM | POA: Insufficient documentation

## 2019-06-16 DIAGNOSIS — Z7982 Long term (current) use of aspirin: Secondary | ICD-10-CM | POA: Diagnosis not present

## 2019-06-16 DIAGNOSIS — D631 Anemia in chronic kidney disease: Secondary | ICD-10-CM | POA: Insufficient documentation

## 2019-06-16 DIAGNOSIS — E785 Hyperlipidemia, unspecified: Secondary | ICD-10-CM | POA: Diagnosis not present

## 2019-06-16 DIAGNOSIS — Z7189 Other specified counseling: Secondary | ICD-10-CM

## 2019-06-16 LAB — CBC WITH DIFFERENTIAL (CANCER CENTER ONLY)
Abs Immature Granulocytes: 0.02 10*3/uL (ref 0.00–0.07)
Basophils Absolute: 0.1 10*3/uL (ref 0.0–0.1)
Basophils Relative: 1 %
Eosinophils Absolute: 0.1 10*3/uL (ref 0.0–0.5)
Eosinophils Relative: 2 %
HCT: 31.2 % — ABNORMAL LOW (ref 36.0–46.0)
Hemoglobin: 10.9 g/dL — ABNORMAL LOW (ref 12.0–15.0)
Immature Granulocytes: 0 %
Lymphocytes Relative: 21 %
Lymphs Abs: 1.1 10*3/uL (ref 0.7–4.0)
MCH: 33.1 pg (ref 26.0–34.0)
MCHC: 34.9 g/dL (ref 30.0–36.0)
MCV: 94.8 fL (ref 80.0–100.0)
Monocytes Absolute: 0.5 10*3/uL (ref 0.1–1.0)
Monocytes Relative: 9 %
Neutro Abs: 3.6 10*3/uL (ref 1.7–7.7)
Neutrophils Relative %: 67 %
Platelet Count: 168 10*3/uL (ref 150–400)
RBC: 3.29 MIL/uL — ABNORMAL LOW (ref 3.87–5.11)
RDW: 11.7 % (ref 11.5–15.5)
WBC Count: 5.3 10*3/uL (ref 4.0–10.5)
nRBC: 0 % (ref 0.0–0.2)

## 2019-06-16 LAB — CMP (CANCER CENTER ONLY)
ALT: 18 U/L (ref 0–44)
AST: 23 U/L (ref 15–41)
Albumin: 4.1 g/dL (ref 3.5–5.0)
Alkaline Phosphatase: 79 U/L (ref 38–126)
Anion gap: 15 (ref 5–15)
BUN: 22 mg/dL (ref 8–23)
CO2: 25 mmol/L (ref 22–32)
Calcium: 9.4 mg/dL (ref 8.9–10.3)
Chloride: 99 mmol/L (ref 98–111)
Creatinine: 1.36 mg/dL — ABNORMAL HIGH (ref 0.44–1.00)
GFR, Est AFR Am: 43 mL/min — ABNORMAL LOW (ref >60)
GFR, Estimated: 37 mL/min — ABNORMAL LOW (ref >60)
Glucose, Bld: 125 mg/dL — ABNORMAL HIGH (ref 70–99)
Potassium: 3.1 mmol/L — ABNORMAL LOW (ref 3.5–5.1)
Sodium: 139 mmol/L (ref 135–145)
Total Bilirubin: 0.4 mg/dL (ref 0.3–1.2)
Total Protein: 7 g/dL (ref 6.5–8.1)

## 2019-06-16 LAB — MAGNESIUM: Magnesium: 0.8 mg/dL — CL (ref 1.7–2.4)

## 2019-06-16 MED ORDER — DIPHENHYDRAMINE HCL 25 MG PO CAPS
ORAL_CAPSULE | ORAL | Status: AC
  Filled 2019-06-16: qty 1

## 2019-06-16 MED ORDER — TRASTUZUMAB-DKST CHEMO 150 MG IV SOLR
6.0000 mg/kg | Freq: Once | INTRAVENOUS | Status: AC
  Administered 2019-06-16: 378 mg via INTRAVENOUS
  Filled 2019-06-16: qty 18

## 2019-06-16 MED ORDER — SODIUM CHLORIDE 0.9% FLUSH
10.0000 mL | Freq: Once | INTRAVENOUS | Status: AC
  Administered 2019-06-16: 10 mL
  Filled 2019-06-16: qty 10

## 2019-06-16 MED ORDER — MAGNESIUM SULFATE 2 GM/50ML IV SOLN
2.0000 g | Freq: Once | INTRAVENOUS | Status: AC
  Administered 2019-06-16: 2 g via INTRAVENOUS

## 2019-06-16 MED ORDER — SODIUM CHLORIDE 0.9 % IV SOLN
Freq: Once | INTRAVENOUS | Status: AC
  Administered 2019-06-16: 12:00:00 via INTRAVENOUS
  Filled 2019-06-16: qty 250

## 2019-06-16 MED ORDER — ACETAMINOPHEN 325 MG PO TABS
650.0000 mg | ORAL_TABLET | Freq: Once | ORAL | Status: AC
  Administered 2019-06-16: 650 mg via ORAL

## 2019-06-16 MED ORDER — SODIUM CHLORIDE 0.9% FLUSH
10.0000 mL | INTRAVENOUS | Status: DC | PRN
  Administered 2019-06-16: 10 mL
  Filled 2019-06-16: qty 10

## 2019-06-16 MED ORDER — DIPHENHYDRAMINE HCL 25 MG PO TABS
25.0000 mg | ORAL_TABLET | Freq: Once | ORAL | Status: AC
  Administered 2019-06-16: 25 mg via ORAL
  Filled 2019-06-16: qty 1

## 2019-06-16 MED ORDER — HEPARIN SOD (PORK) LOCK FLUSH 100 UNIT/ML IV SOLN
500.0000 [IU] | Freq: Once | INTRAVENOUS | Status: AC | PRN
  Administered 2019-06-16: 500 [IU]
  Filled 2019-06-16: qty 5

## 2019-06-16 MED ORDER — ACETAMINOPHEN 325 MG PO TABS
ORAL_TABLET | ORAL | Status: AC
  Filled 2019-06-16: qty 2

## 2019-06-16 MED ORDER — MAGNESIUM SULFATE 2 GM/50ML IV SOLN
INTRAVENOUS | Status: AC
  Filled 2019-06-16: qty 50

## 2019-06-16 NOTE — Telephone Encounter (Signed)
Added infusion 12/4 per 12/2 schedule message. Patient taken schedule in infusion.

## 2019-06-16 NOTE — Assessment & Plan Note (Signed)
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 

## 2019-06-16 NOTE — Patient Instructions (Signed)

## 2019-06-16 NOTE — Patient Instructions (Addendum)
[facility] [facility] Discharge Instructions for Patients Receiving Chemotherapy  Today you received the following chemotherapy agents: trastuzumab and magnesium  To help prevent nausea and vomiting after your treatment, we encourage you to take your nausea medication as directed.   If you develop nausea and vomiting that is not controlled by your nausea medication, call the clinic.   BELOW ARE SYMPTOMS THAT SHOULD BE REPORTED IMMEDIATELY:  *FEVER GREATER THAN 100.5 F  *CHILLS WITH OR WITHOUT FEVER  NAUSEA AND VOMITING THAT IS NOT CONTROLLED WITH YOUR NAUSEA MEDICATION  *UNUSUAL SHORTNESS OF BREATH  *UNUSUAL BRUISING OR BLEEDING  TENDERNESS IN MOUTH AND THROAT WITH OR WITHOUT PRESENCE OF ULCERS  *URINARY PROBLEMS  *BOWEL PROBLEMS  UNUSUAL RASH Items with * indicate a potential emergency and should be followed up as soon as possible.  Feel free to call the clinic should you have any questions or concerns. The clinic phone number is [phone].  Please show the [place] at check-in to the Emergency Department and triage nurse.

## 2019-06-16 NOTE — Assessment & Plan Note (Signed)
She have no side effects from recent treatment so far Her blood counts are improving She will continue therapy/rehab for deconditioning We will continue treatment every 3 weeks She will be due for repeat imaging study in 2 weeks, along with repeat echocardiogram

## 2019-06-17 DIAGNOSIS — K219 Gastro-esophageal reflux disease without esophagitis: Secondary | ICD-10-CM | POA: Diagnosis not present

## 2019-06-17 DIAGNOSIS — C7800 Secondary malignant neoplasm of unspecified lung: Secondary | ICD-10-CM | POA: Diagnosis not present

## 2019-06-17 DIAGNOSIS — Z5111 Encounter for antineoplastic chemotherapy: Secondary | ICD-10-CM | POA: Diagnosis not present

## 2019-06-17 DIAGNOSIS — Z0000 Encounter for general adult medical examination without abnormal findings: Secondary | ICD-10-CM | POA: Diagnosis not present

## 2019-06-17 DIAGNOSIS — E039 Hypothyroidism, unspecified: Secondary | ICD-10-CM | POA: Diagnosis not present

## 2019-06-17 DIAGNOSIS — C541 Malignant neoplasm of endometrium: Secondary | ICD-10-CM | POA: Diagnosis not present

## 2019-06-17 DIAGNOSIS — C50919 Malignant neoplasm of unspecified site of unspecified female breast: Secondary | ICD-10-CM | POA: Diagnosis not present

## 2019-06-17 DIAGNOSIS — E785 Hyperlipidemia, unspecified: Secondary | ICD-10-CM | POA: Diagnosis not present

## 2019-06-17 DIAGNOSIS — M1990 Unspecified osteoarthritis, unspecified site: Secondary | ICD-10-CM | POA: Diagnosis not present

## 2019-06-17 DIAGNOSIS — I10 Essential (primary) hypertension: Secondary | ICD-10-CM | POA: Diagnosis not present

## 2019-06-17 DIAGNOSIS — E876 Hypokalemia: Secondary | ICD-10-CM | POA: Insufficient documentation

## 2019-06-17 NOTE — Progress Notes (Signed)
[place] OFFICE PROGRESS NOTE  Patient Care Team: Burnard Bunting, [name]  ASSESSMENT & PLAN:  Endometrial cancer [facility]) She have no side effects from recent treatment so far Her blood counts are improving She will continue therapy/rehab for deconditioning We will continue treatment every 3 weeks She will be due for repeat imaging study in 2 weeks, along with repeat echocardiogram  Anemia in neoplastic disease She has multifactorial anemia, anemia secondary to prior chemotherapy as well as chronic kidney disease Anemia is improving/stable Recent vitamin B12 level was adequate She is not symptomatic Observe only  Hypomagnesemia She continues to have severe hypomagnesemia and magnesium losing state despite aggressive IV magnesium and oral magnesium replacement therapy I recommend further IV magnesium infusion today and at the end of the week So far, she is not symptomatic  CKD (chronic kidney disease), stage III ([place]) Renal function is stable. Monitor closely  Hypokalemia due to excessive renal loss of potassium She has mild hypokalemia secondary to magnesium losing state I will replace magnesium first She will continue on potassium rich diet   Orders Placed This Encounter  Procedures  . CT Chest W Contrast    Standing Status:   Future    Standing Expiration Date:   06/15/2020    Order Specific Question:   If indicated for the ordered procedure, I authorize the administration of contrast media per Radiology protocol    Answer:   Yes    Order Specific Question:   Preferred imaging location?    Answer:   [facility]    Order Specific Question:   Radiology Contrast Protocol - do NOT remove file path    Answer:   [email or web address]  . CT Abdomen Pelvis W Contrast    Standing Status:   Future    Standing Expiration Date:   06/15/2020    Order Specific Question:   If indicated for the ordered  procedure, I authorize the administration of contrast media per Radiology protocol    Answer:   Yes    Order Specific Question:   Preferred imaging location?    Answer:   [facility]    Order Specific Question:   Radiology Contrast Protocol - do NOT remove file path    Answer:   [email or web address]  . ECHOCARDIOGRAM COMPLETE    Standing Status:   Future    Standing Expiration Date:   09/13/2020    Order Specific Question:   Where should this test be performed    Answer:   [place]    Order Specific Question:   Perflutren DEFINITY (image enhancing agent) should be administered unless hypersensitivity or allergy exist    Answer:   Administer Perflutren    Order Specific Question:   Reason for exam-Echo    Answer:   Chemo  V67.2 / Z09    INTERVAL HISTORY: Please see below for problem oriented charting. She returns for further follow-up She is doing well She have no symptoms of abdominal cramping from low magnesium She denies recent diarrhea Overall, she tolerated treatment very well without side effects No recent signs or symptoms of congestive heart failure  SUMMARY OF ONCOLOGIC HISTORY: Oncology History Overview Note  LETITIA SABALA  has a remote history of left  breast cancer at age 25 but received BRCA testing approximately 5 years ago which was negative. Her cancer was treated with surgery but no radiation or chemotherapy.   Serous endometrial cancer, MSI stable ER  positive, PR neg, Her2/neu 3+      Endometrial cancer ([place])  08/29/2016 Pathology Results   Endometrium, biopsy - HIGH GRADE ENDOMETRIAL CARCINOMA, SEE COMMENT. Microscopic Comment The sections show multiple fragments of adenocarcinoma displaying glandular and papillary patterns associated with high grade cytomorphology characterized by nuclear pleomorphism, prominent nucleoli and brisk mitosis. Immunohistochemical stains show that the tumor cells are positive for vimentin, p16,  p53 with increased Ki-67 expression. Estrogen and progesterone receptor stains show patchy weak positivity. No significant positivity is seen with CEA. The findings are consistent with high grade endometrial carcinoma and the overall morphology and phenotypic features favor serous carcinoma.   08/29/2016 Initial Diagnosis   She presented with postmenopausal bleeding   10/03/2016 Imaging   CT C/A/P 09/2016 IMPRESSION: 1. Thickening of the endometrial canal up to 19 mm in fundus, presumably corresponding to the patient's reported endometrial carcinoma. 2. Multiple tiny pulmonary nodules scattered throughout the lungs bilaterally measuring 4 mm or less in size. Nodules of this size are typically considered statistically likely benign. In the setting of known primary malignancy, metastatic disease to the lungs is not excluded, but is not strongly favored on today's examination. Attention on followup studies is recommended to ensure the stability or resolution of these nodules. 3. Subcentimeter low-attenuation lesion in the central aspect of segment 8 of the liver is too small to characterize. This is statistically likely a tiny cyst, but warrants attention on follow-up studies to exclude the possibility of a solitary hepatic metastasis. 4. 1.5 x 1.5 x 1.7 cm well-circumscribed lesion in the proximal stomach. This is of uncertain etiology and significance, and could represent a benign gastric polyp, however, further evaluation with nonemergent endoscopy is suggested in the near future for further evaluation. 5. **An incidental finding of potential clinical significance has been found. 1.1 x 1.6 cm thyroid nodule in the inferior aspect of the right lobe of the thyroid gland. Follow-up evaluation with nonemergent thyroid ultrasound is recommended in the near future to better evaluate this finding. This recommendation follows ACR consensuss guidelines: Managing Incidental Thyroid Nodules Detected on Imaging: White  Paper of the ACR Incidental Thyroid Findings Committee. J Am Coll Radiol 2015;12(2):143-150.** 6. Aortic atherosclerosis, in addition to left anterior descending coronary artery disease   10/22/2016 Surgery   Robotic assisted total hysterectomy, BSO and bilateral pelvic lymphadenectomy  Final pathology revealed a 3cm polyp containing serous carcinoma but with no myometrial invasion, no LVSI and negative nodes.  Stage IA Uterine serous cancer   10/22/2016 Pathology Results   1. Lymph nodes, regional resection, right pelvic - SIX BENIGN LYMPH NODES (0/6). 2. Lymph nodes, regional resection, left pelvic - SEVEN BENIGN LYMPH NODES (0/7). 3. Uterus +/- tubes/ovaries, neoplastic, with right ovary and fallopian tube ENDOMYOMETRIUM - SEROUS CARCINOMA ARISING WITHIN AN ENDOMETRIAL POLYP - NO MYOMETRIAL INVASION IDENTIFIED - ADENOMYOSIS - LEIOMYOMA (1 CM) - SEE ONCOLOGY TABLE AND COMMENT CERVIX - CARCINOMA FOCALLY INVOLVES ENDOCERVICAL GLANDS - NABOTHIAN CYSTS RIGHT ADNEXA - BENIGN OVARY AND FALLOPIAN TUBE - NO CARCINOMA IDENTIFIED 4. Cul-de-sac biopsy - MESOTHELIAL HYPERPLASIA Microscopic Comment 3. ONCOLOGY TABLE-UTERUS, CARCINOMA OR CARCINOSARCOMA Specimen: Uterus, right fallopian tube and ovary Procedure: Total hysterectomy and right salpingo-oophorectomy Lymph node sampling performed: Bilateral pelvic regional resection Specimen integrity: Intact Maximum tumor size: 3 cm (polyp) Histologic type: Serous carcinoma Grade: High grade Myometrial invasion: Not identified Cervical stromal involvement: No, focal endocervical gland involvement Extent of involvement of other organs: Not identified Lymph - vascular invasion: Not identified Peritoneal washings: N/A Lymph nodes:  Examined: 0 Sentinel 13 Non-sentinel 13 Total Lymph nodes with metastasis: 0 Isolated tumor cells (< 0.2 mm): 0 Micrometastasis: (> 0.2 mm and < 2.0 mm): 0 Macrometastasis: (> 2.0 mm): 0 Extracapsular  extension: N/A Pelvic lymph nodes: 0 involved of 13 lymph nodes. Para-aortic lymph nodes: No para-aortic nodes submitted TNM code: pT1a, pNX FIGO Stage (based on pathologic findings, needs clinical correlation): IA Comment: Immunohistochemistry for cytokeratin AE1/AE3 is performed on all of the lymph nodes (parts 1 & 2) and no metastatic carcinoma is identified.   07/04/2017 Genetic Testing   The patient had genetic testing due to a personal history of breast and uterine cancer, and a family history of stomach cancer.  The Multi-Cancer Panel was ordered. The Multi-Cancer Panel offered by [facility] includes sequencing and/or deletion duplication testing of the following 83 genes: ALK, APC, ATM, AXIN2,BAP1,  BARD1, BLM, BMPR1A, BRCA1, BRCA2, BRIP1, CASR, CDC73, CDH1, CDK4, CDKN1B, CDKN1C, CDKN2A (p14ARF), CDKN2A (p16INK4a), CEBPA, CHEK2, CTNNA1, DICER1, DIS3L2, EGFR (c.2369C>T, p.Thr790Met variant only), EPCAM (Deletion/duplication testing only), FH, FLCN, GATA2, GPC3, GREM1 (Promoter region deletion/duplication testing only), HOXB13 (c.251G>A, p.Gly84Glu), HRAS, KIT, MAX, MEN1, MET, MITF (c.952G>A, p.Glu318Lys variant only), MLH1, MSH2, MSH3, MSH6, MUTYH, NBN, NF1, NF2, NTHL1, PALB2, PDGFRA, PHOX2B, PMS2, POLD1, POLE, POT1, PRKAR1A, PTCH1, PTEN, RAD50, RAD51C, RAD51D, RB1, RECQL4, RET, RUNX1, SDHAF2, SDHA (sequence changes only), SDHB, SDHC, SDHD, SMAD4, SMARCA4, SMARCB1, SMARCE1, STK11, SUFU, TERC, TERT, TMEM127, TP53, TSC1, TSC2, VHL, WRN and WT1.   Results: No pathogenic variants were identified.  A variant of uncertain significance in the gene APC was identified.  c.791A>G (p.Gln264Arg).  The date of this test report is 07/04/2017.     Genetic Testing   Patient has genetic testing done for MSI. 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    12/03/2017 Imaging   CT Chest/Abd/Pelvis to follow pulmonary nodule and gastric mass IMPRESSION: 1. Stable CT of the chest.  Small pulmonary nodules are unchanged when compared with previous exam. 2. No new findings identified. 3. Subcentimeter low-attenuation lesions within the liver are remain too small to characterize but are stable from prior exam. 4. Persistent indeterminate low-attenuation structure within the proximal stomach is unchanged measuring 1.4 cm. Correlation with direct visualization is advised   06/30/2018 Imaging   CT CHEST Lungs/Pleura: Stable scattered sub-cm pulmonary nodules are again seen bilaterally and are stable compared to previous studies. No new or enlarging pulmonary nodules or masses identified. No evidence of pulmonary infiltrate or pleural effusion.   08/19/2018 Echocardiogram   [place] is done in [place]: EF 60-65%. Mild impaired relaxation. (report scanned)   09/17/2018 Relapse/Recurrence   Presented with c/o vagina discharge.  Lesion noted at the left vaginal apex 1m lesion removed Path c/w high grade serous cancer   09/17/2018 Pathology Results   Vagina, biopsy, left apex - HIGH GRADE SEROUS CARCINOMA.   09/28/2018 PET scan   1. Two hypermetabolic axial lymph nodes. Unusual site for metastatic endometrial carcinoma however the activity is more intense than typically seen in reactive adenopathy. Suggest ultrasound-guided percutaneous biopsy of the larger RIGHT axial lymph node 2. No evidence of local recurrence at the vaginal cuff.  3. No metastatic adenopathy in the abdomen or pelvis. 4. Stable small pulmonary nodules   10/09/2018 Pathology Results   Lymph node, needle/core biopsy, right axilla - METASTATIC CARCINOMA, SEE COMMENT. Microscopic Comment The carcinoma appears high grade. Immunohistochemistry is positive for cytokeratin 7, PAX-8, and ER. Cytokeratin 20, CDX-2, PR, and GATA-3 are negative. The  findings along with the history are consistent with a gynecologic primary.    10/09/2018 Procedure   Ultrasound-guided core biopsies of a right axillary lymph node.   10/14/2018  Cancer Staging   Staging form: Corpus Uteri - Carcinoma and Carcinosarcoma, AJCC 8th Edition - Clinical: Stage IVB (cT1, cN0, pM1) - Signed by [name], MD on 10/14/2018   10/19/2018 Imaging   Placement of single lumen port a cath via right internal jugular vein. The catheter tip lies at the cavo-atrial junction. A power injectable port a cath was placed and is ready for immediate use.    10/21/2018 -  Chemotherapy   The patient had carboplatin and taxol for treatment   11/11/2018 -  Chemotherapy   The patient had trastuzumab ([name]) 450 mg in sodium chloride 0.9 % 250 mL chemo infusion, 483 mg, Intravenous,  Once, 6 of 6 cycles Administration: 450 mg (11/11/2018), 378 mg (12/02/2018), 378 mg (02/03/2019), 378 mg (12/23/2018), 378 mg (01/13/2019), 378 mg (02/24/2019) trastuzumab-dkst (OGIVRI) 378 mg in sodium chloride 0.9 % 250 mL chemo infusion, 6 mg/kg = 378 mg (100 % of original dose 6 mg/kg), Intravenous,  Once, 5 of 6 cycles Dose modification: 6 mg/kg (original dose 6 mg/kg, Cycle 7, Reason: Other (see comments)) Administration: 378 mg (03/24/2019), 378 mg (04/14/2019), 378 mg (05/05/2019), 378 mg (05/26/2019), 378 mg (06/16/2019)  for chemotherapy treatment.    12/21/2018 PET scan   1. Interval resolution of hypermetabolic right axillary lymph nodes. No metabolic findings highly suspicious for recurrent metastatic disease. 2. New mild hypermetabolism within a borderline prominent portacaval lymph node, nonspecific. While a reactive node is favored, a lymph node metastasis cannot be entirely excluded. Suggest attention to this lymph node on follow-up PET-CT in 3-6 months. 3. Nonspecific new hypermetabolism at the ileocecal valve, more likely physiologic given absence of CT correlate. 4. Scattered subcentimeter pulmonary nodules are all stable and below PET resolution, more likely benign, continued CT surveillance advised. 5. Chronic findings include: Aortic Atherosclerosis (ICD10-I70.0). Marked  diffuse colonic diverticulosis. Coronary atherosclerosis.    12/28/2018 Echocardiogram   1. The left ventricle has normal systolic function with an ejection fraction of 60-65%. The cavity size was normal. Left ventricular diastolic Doppler parameters are consistent with impaired relaxation.  2. GLS recorded as -10.7 but LV appears hyperdynamic and tracking of endocardium appears poor.  3. The right ventricle has normal systolic function. The cavity was normal. There is no increase in right ventricular wall thickness.  4. Mild thickening of the mitral valve leaflet.  5. The aortic valve was not well visualized. Mild thickening of the aortic valve. Aortic valve regurgitation is trivial by color flow Doppler.   03/23/2019 Imaging   PET 1. No findings of hypermetabolic residual/recurrent or metastatic disease. 2. Similar low-level hypermetabolism within normal sized portocaval and right inguinal nodes, favored to be reactive. 3. Ongoing stability of small bilateral pulmonary nodules, favored to be benign. Below PET resolution. 4. Coronary artery atherosclerosis. Aortic Atherosclerosis   03/26/2019 Echocardiogram   1. The left ventricle has normal systolic function, with an ejection fraction of 55-60%. The cavity size was normal. Left ventricular diastolic Doppler parameters are consistent with impaired relaxation.  2. The right ventricle has normal systolic function. The cavity was normal.  3. The mitral valve is abnormal. Mild thickening of the mitral valve leaflet. There is mild mitral annular calcification present.  4. The tricuspid valve is grossly normal.  5. The aortic valve is tricuspid. Mild calcification of the aortic valve. No  stenosis of the aortic valve.  6. The aorta is normal unless otherwise noted.  7. Normal LV systolic function; grade 1 diastolic dysfunction; ZSM-27.0%.   Metastasis to lymph nodes ([place])  10/14/2018 Initial Diagnosis   Metastasis to lymph nodes ([place])    10/21/2018 - 03/16/2019 Chemotherapy   The patient had palonosetron (ALOXI) injection 0.25 mg, 0.25 mg, Intravenous,  Once, 7 of 7 cycles Administration: 0.25 mg (10/21/2018), 0.25 mg (11/11/2018), 0.25 mg (12/02/2018), 0.25 mg (12/23/2018), 0.25 mg (01/13/2019), 0.25 mg (02/03/2019), 0.25 mg (02/24/2019) CARBOplatin (PARAPLATIN) 310 mg in sodium chloride 0.9 % 250 mL chemo infusion, 310 mg, Intravenous,  Once, 7 of 7 cycles Dose modification: 300 mg (original dose 311.5 mg, Cycle 4, Reason: Dose not tolerated) Administration: 310 mg (10/21/2018), 300 mg (11/11/2018), 300 mg (12/02/2018), 300 mg (12/23/2018), 300 mg (01/13/2019), 300 mg (02/03/2019), 300 mg (02/24/2019) PACLitaxel (TAXOL) 222 mg in sodium chloride 0.9 % 250 mL chemo infusion (> 34m/m2), 135 mg/m2 = 222 mg, Intravenous,  Once, 7 of 7 cycles Administration: 222 mg (10/21/2018), 222 mg (11/11/2018), 222 mg (12/02/2018), 222 mg (12/23/2018), 222 mg (01/13/2019), 222 mg (02/03/2019), 222 mg (02/24/2019) fosaprepitant (EMEND) 150 mg, dexamethasone (DECADRON) 12 mg in sodium chloride 0.9 % 145 mL IVPB, , Intravenous,  Once, 7 of 7 cycles Administration:  (10/21/2018),  (11/11/2018),  (12/02/2018),  (12/23/2018),  (01/13/2019),  (02/03/2019),  (02/24/2019)  for chemotherapy treatment.      REVIEW OF SYSTEMS:   Constitutional: Denies fevers, chills or abnormal weight loss Eyes: Denies blurriness of vision Ears, nose, mouth, throat, and face: Denies mucositis or sore throat Respiratory: Denies cough, dyspnea or wheezes Cardiovascular: Denies palpitation, chest discomfort or lower extremity swelling Gastrointestinal:  Denies nausea, heartburn or change in bowel habits Skin: Denies abnormal skin rashes Lymphatics: Denies new lymphadenopathy or easy bruising Neurological:Denies numbness, tingling or new weaknesses Behavioral/Psych: Mood is stable, no new changes  All other systems were reviewed with the patient and are negative.  I have reviewed the past medical history,  past surgical history, social history and family history with the patient and they are unchanged from previous note.  ALLERGIES:  has No Known Allergies.  MEDICATIONS:  Current Outpatient Medications  Medication Sig Dispense Refill  . acetaminophen (TYLENOL) 500 MG tablet Take 1,000 mg by mouth every 6 (six) hours as needed.    .Marland Kitchenaspirin EC 81 MG tablet Take 81 mg by mouth daily.    .Marland Kitchenatorvastatin (LIPITOR) 20 MG tablet Take 20 mg by mouth every evening.     . Calcium Carbonate-Vitamin D (CALCIUM-VITAMIN D) 500-200 MG-UNIT per tablet Take 1 tablet by mouth daily.    . citalopram (CELEXA) 20 MG tablet Take 20 mg by mouth at bedtime.     .Marland Kitchenlevothyroxine (SYNTHROID, LEVOTHROID) 100 MCG tablet Take 100 mcg by mouth daily before breakfast.     . lidocaine-prilocaine (EMLA) cream Apply to affected area once 30 g 3  . losartan-hydrochlorothiazide (HYZAAR) 100-25 MG tablet     . magnesium oxide (MAG-OX) 400 (241.3 Mg) MG tablet Take 1 tablet (400 mg total) by mouth daily. 30 tablet 11  . metoprolol succinate (TOPROL-XL) 25 MG 24 hr tablet Take 25 mg by mouth daily.    . Multiple Vitamin (MULTIVITAMIN WITH MINERALS) TABS Take 1 tablet by mouth daily.    . ondansetron (ZOFRAN) 8 MG tablet Take 1 tablet (8 mg total) by mouth every 8 (eight) hours as needed. Start on the third day after chemotherapy. 30 tablet 1  .  pantoprazole (PROTONIX) 40 MG tablet Take 40 mg by mouth every morning.     . valACYclovir (VALTREX) 1000 MG tablet valacyclovir 1 gram tablet     No current facility-administered medications for this visit.     PHYSICAL EXAMINATION: ECOG PERFORMANCE STATUS: 1 - Symptomatic but completely ambulatory  Vitals:   06/16/19 1046  BP: 132/68  Pulse: 72  Resp: 18  Temp: 97.8 F (36.6 C)  SpO2: 97%   Filed Weights   06/16/19 1046  Weight: 131 lb 8 oz (59.6 kg)    GENERAL:alert, no distress and comfortable SKIN: skin color, texture, turgor are normal, no rashes or significant  lesions EYES: normal, Conjunctiva are pink and non-injected, sclera clear OROPHARYNX:no exudate, no erythema and lips, buccal mucosa, and tongue normal  NECK: supple, thyroid normal size, non-tender, without nodularity LYMPH:  no palpable lymphadenopathy in the cervical, axillary or inguinal LUNGS: clear to auscultation and percussion with normal breathing effort HEART: regular rate & rhythm and no murmurs and no lower extremity edema ABDOMEN:abdomen soft, non-tender and normal bowel sounds Musculoskeletal:no cyanosis of digits and no clubbing  NEURO: alert & oriented x 3 with fluent speech, no focal motor/sensory deficits  LABORATORY DATA:  I have reviewed the data as listed    Component Value Date/Time   NA 139 06/16/2019 1020   NA 139 02/13/2017 1512   K 3.1 (L) 06/16/2019 1020   K 4.2 02/13/2017 1512   CL 99 06/16/2019 1020   CO2 25 06/16/2019 1020   CO2 28 02/13/2017 1512   GLUCOSE 125 (H) 06/16/2019 1020   GLUCOSE 118 02/13/2017 1512   BUN 22 06/16/2019 1020   BUN 25.2 02/13/2017 1512   CREATININE 1.36 (H) 06/16/2019 1020   CREATININE 1.2 (H) 02/13/2017 1512   CALCIUM 9.4 06/16/2019 1020   CALCIUM 10.0 02/13/2017 1512   PROT 7.0 06/16/2019 1020   ALBUMIN 4.1 06/16/2019 1020   AST 23 06/16/2019 1020   ALT 18 06/16/2019 1020   ALKPHOS 79 06/16/2019 1020   BILITOT 0.4 06/16/2019 1020   GFRNONAA 37 (L) 06/16/2019 1020   GFRAA 43 (L) 06/16/2019 1020    No results found for: SPEP, UPEP  Lab Results  Component Value Date   WBC 5.3 06/16/2019   NEUTROABS 3.6 06/16/2019   HGB 10.9 (L) 06/16/2019   HCT 31.2 (L) 06/16/2019   MCV 94.8 06/16/2019   PLT 168 06/16/2019      Chemistry      Component Value Date/Time   NA 139 06/16/2019 1020   NA 139 02/13/2017 1512   K 3.1 (L) 06/16/2019 1020   K 4.2 02/13/2017 1512   CL 99 06/16/2019 1020   CO2 25 06/16/2019 1020   CO2 28 02/13/2017 1512   BUN 22 06/16/2019 1020   BUN 25.2 02/13/2017 1512   CREATININE 1.36 (H)  06/16/2019 1020   CREATININE 1.2 (H) 02/13/2017 1512      Component Value Date/Time   CALCIUM 9.4 06/16/2019 1020   CALCIUM 10.0 02/13/2017 1512   ALKPHOS 79 06/16/2019 1020   AST 23 06/16/2019 1020   ALT 18 06/16/2019 1020   BILITOT 0.4 06/16/2019 1020      All questions were answered. The patient knows to call the clinic with any problems, questions or concerns. No barriers to learning was detected.  I spent 25 minutes counseling the patient face to face. The total time spent in the appointment was 30 minutes and more than 50% was on counseling and review of  test results  [name], MD 06/17/2019 8:06 AM

## 2019-06-17 NOTE — Assessment & Plan Note (Signed)
Renal function is stable. Monitor closely 

## 2019-06-17 NOTE — Assessment & Plan Note (Signed)
She continues to have severe hypomagnesemia and magnesium losing state despite aggressive IV magnesium and oral magnesium replacement therapy I recommend further IV magnesium infusion today and at the end of the week So far, she is not symptomatic

## 2019-06-17 NOTE — Assessment & Plan Note (Signed)
She has mild hypokalemia secondary to magnesium losing state I will replace magnesium first She will continue on potassium rich diet

## 2019-06-18 ENCOUNTER — Inpatient Hospital Stay: Payer: Medicare HMO

## 2019-06-18 ENCOUNTER — Telehealth: Payer: Self-pay | Admitting: Hematology and Oncology

## 2019-06-18 ENCOUNTER — Ambulatory Visit: Payer: Medicare HMO

## 2019-06-18 ENCOUNTER — Other Ambulatory Visit: Payer: Self-pay

## 2019-06-18 VITALS — BP 158/69 | HR 78 | Temp 98.2°F | Resp 17

## 2019-06-18 DIAGNOSIS — Z5112 Encounter for antineoplastic immunotherapy: Secondary | ICD-10-CM | POA: Diagnosis not present

## 2019-06-18 DIAGNOSIS — D631 Anemia in chronic kidney disease: Secondary | ICD-10-CM | POA: Diagnosis not present

## 2019-06-18 DIAGNOSIS — R918 Other nonspecific abnormal finding of lung field: Secondary | ICD-10-CM | POA: Diagnosis not present

## 2019-06-18 DIAGNOSIS — N183 Chronic kidney disease, stage 3 unspecified: Secondary | ICD-10-CM | POA: Diagnosis not present

## 2019-06-18 DIAGNOSIS — Z9221 Personal history of antineoplastic chemotherapy: Secondary | ICD-10-CM | POA: Diagnosis not present

## 2019-06-18 DIAGNOSIS — Z95828 Presence of other vascular implants and grafts: Secondary | ICD-10-CM

## 2019-06-18 DIAGNOSIS — C773 Secondary and unspecified malignant neoplasm of axilla and upper limb lymph nodes: Secondary | ICD-10-CM | POA: Diagnosis not present

## 2019-06-18 DIAGNOSIS — E876 Hypokalemia: Secondary | ICD-10-CM | POA: Diagnosis not present

## 2019-06-18 DIAGNOSIS — C541 Malignant neoplasm of endometrium: Secondary | ICD-10-CM

## 2019-06-18 DIAGNOSIS — E785 Hyperlipidemia, unspecified: Secondary | ICD-10-CM | POA: Diagnosis not present

## 2019-06-18 MED ORDER — SODIUM CHLORIDE 0.9% FLUSH
10.0000 mL | INTRAVENOUS | Status: DC | PRN
  Administered 2019-06-18: 10 mL via INTRAVENOUS
  Filled 2019-06-18: qty 10

## 2019-06-18 MED ORDER — SODIUM CHLORIDE 0.9 % IV SOLN
4.0000 g | Freq: Once | INTRAVENOUS | Status: AC
  Administered 2019-06-18: 4 g via INTRAVENOUS
  Filled 2019-06-18: qty 8

## 2019-06-18 MED ORDER — HEPARIN SOD (PORK) LOCK FLUSH 100 UNIT/ML IV SOLN
500.0000 [IU] | Freq: Once | INTRAVENOUS | Status: AC
  Administered 2019-06-18: 500 [IU] via INTRAVENOUS
  Filled 2019-06-18: qty 5

## 2019-06-18 MED ORDER — SODIUM CHLORIDE 0.9 % IV SOLN
INTRAVENOUS | Status: DC
  Administered 2019-06-18: 15:00:00 via INTRAVENOUS
  Filled 2019-06-18: qty 250

## 2019-06-18 NOTE — Patient Instructions (Signed)
Magnesium Sulfate injection What is this medicine? MAGNESIUM SULFATE (mag NEE zee um SUL fate) is an electrolyte injection commonly used to treat low magnesium levels in your blood. It is also used to prevent or control seizures in women with preeclampsia or eclampsia. This medicine may be used for other purposes; ask your health care provider or pharmacist if you have questions. What should I tell my health care provider before I take this medicine? They need to know if you have any of these conditions:  heart disease  history of irregular heart beat  kidney disease  an unusual or allergic reaction to magnesium sulfate, medicines, foods, dyes, or preservatives  pregnant or trying to get pregnant  breast-feeding How should I use this medicine? This medicine is for infusion into a vein. It is given by a health care professional in a hospital or clinic setting. Talk to your pediatrician regarding the use of this medicine in children. While this drug may be prescribed for selected conditions, precautions do apply. Overdosage: If you think you have taken too much of this medicine contact a poison control center or emergency room at once. NOTE: This medicine is only for you. Do not share this medicine with others. What if I miss a dose? This does not apply. What may interact with this medicine? This medicine may interact with the following medications:  certain medicines for anxiety or sleep  certain medicines for seizures like phenobarbital  digoxin  medicines that relax muscles for surgery  narcotic medicines for pain This list may not describe all possible interactions. Give your health care provider a list of all the medicines, herbs, non-prescription drugs, or dietary supplements you use. Also tell them if you smoke, drink alcohol, or use illegal drugs. Some items may interact with your medicine. What should I watch for while using this medicine? Your condition will be  monitored carefully while you are receiving this medicine. You may need blood work done while you are receiving this medicine. What side effects may I notice from receiving this medicine? Side effects that you should report to your doctor or health care professional as soon as possible:  allergic reactions like skin rash, itching or hives, swelling of the face, lips, or tongue  facial flushing  muscle weakness  signs and symptoms of low blood pressure like dizziness; feeling faint or lightheaded, falls; unusually weak or tired  signs and symptoms of a dangerous change in heartbeat or heart rhythm like chest pain; dizziness; fast or irregular heartbeat; palpitations; breathing problems  sweating This list may not describe all possible side effects. Call your doctor for medical advice about side effects. You may report side effects to FDA at 1-800-FDA-1088. Where should I keep my medicine? This drug is given in a hospital or clinic and will not be stored at home. NOTE: This sheet is a summary. It may not cover all possible information. If you have questions about this medicine, talk to your doctor, pharmacist, or health care provider.  2020 Elsevier/Gold Standard (2016-01-17 12:31:42)  

## 2019-06-18 NOTE — Telephone Encounter (Signed)
Confirmed December/January appointments with patient.  °

## 2019-06-22 ENCOUNTER — Ambulatory Visit: Payer: Medicare HMO | Attending: Hematology and Oncology

## 2019-06-22 ENCOUNTER — Other Ambulatory Visit: Payer: Self-pay

## 2019-06-22 DIAGNOSIS — R2689 Other abnormalities of gait and mobility: Secondary | ICD-10-CM | POA: Diagnosis not present

## 2019-06-22 DIAGNOSIS — M6281 Muscle weakness (generalized): Secondary | ICD-10-CM | POA: Insufficient documentation

## 2019-06-22 DIAGNOSIS — R5381 Other malaise: Secondary | ICD-10-CM | POA: Diagnosis not present

## 2019-06-22 DIAGNOSIS — C541 Malignant neoplasm of endometrium: Secondary | ICD-10-CM | POA: Diagnosis not present

## 2019-06-22 NOTE — Therapy (Signed)
[place], [place], [address] Phone: [phone]   Fax:  [phone]  Physical Therapy Progress Note  Patient Details  Name: Vicki Cervantes MRN: [id number] Date of Birth: [birth year] Referring Provider (PT): [name]   Encounter Date: 06/22/2019  PT End of Session - 06/22/19 1108    Visit Number  14    Number of Visits  17    Date for PT Re-Evaluation  08/15/19    PT Start Time  1105    PT Stop Time  1200    PT Time Calculation (min)  55 min    Activity Tolerance  Patient tolerated treatment well    Behavior During Therapy  [facility] for tasks assessed/performed       Past Medical History:  Diagnosis Date  . Aortic atherosclerosis ([place])   . Arthritis   . Blood transfusion without reported diagnosis   . Breast cancer ([place])     LEFT, '82-left breast cancer-surgery only  . Cataract    removed both eyes  . Complication of anesthesia    nausea  . Diverticulosis   . Esophageal stricture   . Family history of stomach cancer   . GERD (gastroesophageal reflux disease)   . Hemorrhoids   . Hiatal hernia   . High triglycerides   . Hypercholesteremia   . Hypertension   . Hypothyroidism   . Osteoarthritis   . PONV (postoperative nausea and vomiting)   . Uterine cancer [facility])     Past Surgical History:  Procedure Laterality Date  . COLONOSCOPY    . EUS N/A 01/07/2013   Procedure: UPPER ENDOSCOPIC ULTRASOUND (EUS) LINEAR;  Surgeon: Milus Banister, MD;  Location: [facility] ENDOSCOPY;  Service: Endoscopy;  Laterality: N/A;  . EUS N/A 01/05/2015   Procedure: UPPER ENDOSCOPIC ULTRASOUND (EUS) LINEAR;  Surgeon: Milus Banister, MD;  Location: [facility] ENDOSCOPY;  Service: Endoscopy;  Laterality: N/A;  . FOOT SURGERY Left 2009-2010   x2. Surgery in 2011  . IR IMAGING GUIDED PORT INSERTION  10/19/2018  . LEFT HEART CATHETERIZATION WITH CORONARY ANGIOGRAM N/A 01/24/2014   Procedure: LEFT HEART CATHETERIZATION WITH CORONARY  ANGIOGRAM;  Surgeon: Peter M Martinique, MD;  Location: [facility] CATH LAB;  Service: Cardiovascular;  Laterality: N/A;  . LUMBAR [place] SURGERY  01-01-13  . MASTECTOMY, RADICAL Left 1982  . OVARY SURGERY  1972-1973  . ROBOTIC ASSISTED TOTAL HYSTERECTOMY WITH BILATERAL SALPINGO OOPHERECTOMY N/A 10/22/2016   Procedure: XI ROBOTIC ASSISTED TOTAL HYSTERECTOMY WITH RIGHT SALPINGO OOPHORECTOMY; INJECTION OF GREEN DYE WITH EXCISION OF BILATERAL PELVIC LYMPH NODES;  Surgeon: Janie Morning, MD;  Location: [facility] ORS;  Service: Gynecology;  Laterality: N/A;  . ROTATOR CUFF REPAIR Left   . SENTINEL NODE BIOPSY N/A 10/22/2016   Procedure: SENTINEL NODE BIOPSY;  Surgeon: Janie Morning, MD;  Location: [facility] ORS;  Service: Gynecology;  Laterality: N/A;  . SIMPLE MASTECTOMY Left 1982  . TOTAL KNEE ARTHROPLASTY Bilateral 2009-2010   x2  . UPPER GASTROINTESTINAL ENDOSCOPY      There were no vitals filed for this visit.  Subjective Assessment - 06/22/19 1112    Subjective  Pt states that she has not been able to come due to her sister passed away suddenly. She reports that she would like to take a break from therapy and then start back in January at the beginning of the year.    Pertinent History  Pt has a history of L radical mastectomy when she was 41 and R simple  mastectomy. No previous signs of edema in her B arms. Pt had a hysterectomy 2 years ago due to endometrial cancer which returned this year.    Currently in Pain?  No/denies    Pain Score  0-No pain                       [facility] Adult PT Treatment/Exercise - 06/22/19 0001      Exercises   Exercises  Other Exercises    Other Exercises   S/L horizontal abd with 2#wgt for postural/core stabilization 2x 10 on the R and 1x 10 2# on the L 1x 10 1# on the L due to previous shoulder surgery and weakness. VC for scapular activation.       Knee/Hip Exercises: Supine   Bridges  Strengthening;Both;20 reps    Bridges Limitations  feet lined up at pelvis 20x      Straight Leg Raises  Strengthening;Right;Left;2 sets;10 reps    Straight Leg Raises Limitations  Good form no extension lag pt core activation is improving with no fatigue during SLR    Straight Leg Raise with External Rotation  Strengthening;Right;Left;10 reps;2 sets    Straight Leg Raise with External Rotation Limitations  Good form this session no difficulty     Knee Extension  Strengthening;10 reps    Knee Extension Limitations  supine with transverse abdominus bracing, ball squeeze and hook lying pt performed knee extension with demonsration and VC for abdominal bracing throughout movement.     Other Supine Knee/Hip Exercises  Supine transverse abdominus bracing with table top and red theraband at knees, VC and tactile cueing to keep knees apart for pelvic stabilization and heel taps 2 sets of 10 alternating LE.     Other Supine Knee/Hip Exercises  Supine hip abd with red theraband 20x       Knee/Hip Exercises: Sidelying   Clams  S/L clamshell with red theraband, tactile cueing at the foot to prevent compensation with hip abductors or flexion 2x 10 Bil      Ankle Exercises: Aerobic   Stationary Bike  lvl 2 6 minutes pt was slightly short of breathe following bike exercises but recovered in less than 1 minute after termination of activity.              PT Education - 06/22/19 1158    Education Details  Discussed POC with patient. She states that her twin sister just passed away and her cancer treatments are making her tired. She does her exercises at home as she is able. She would like to take a break and work on her exercises at home for now but does want to come back at the beginning of the year to finish physical therapy. She will continue working on her HEP at home until returning.    Person(s) Educated  Patient    Methods  Explanation    Comprehension  Verbalized understanding       PT Short Term Goals - 06/22/19 1111      PT SHORT TERM GOAL #1   Title  Pt will be able  to state precautions and risk factors for lymphedema within 4 weeks in order to decrease risk for lymphedema in the future related to lymphnode removal and surgery.    Target Date  08/15/19        PT Long Term Goals - 06/22/19 1109      PT LONG TERM GOAL #1   Title  Pt will perform  52/56 on the BERG balance test in order to demonstrate a decrease risk for falls.    Time  4    Period  Weeks    Status  On-going    Target Date  08/15/19      PT LONG TERM GOAL #2   Title  The patient will maintain single limb stance x 10 seconds bilaterally.    Time  4    Period  Weeks    Status  On-going    Target Date  08/15/19      PT LONG TERM GOAL #3   Title  The patient will return demo HEP for core strength, L hip stretching/strengthening and high level balance.    Baseline  Pt is working on HEP at home.    Time  4    Period  Weeks    Status  On-going    Target Date  08/15/19            Plan - 06/22/19 1107    Clinical Impression Statement  Pt presents to physicla therapy after a break due to cateract surgery and her sister passing away. She continues at the same level she was at her last progress note staying at a Moderate Risk for falls. Light exercise was performed today due to fatigue with most exercises in supine. Pt was able to tolerate slight increase in difficulty with core stabilization ther-ex. On pt request she will work on her HEP at home and return at the beginning of the new year to contiue to work on balance to decrease risk for falls. Pt will work on her HEP at home during the interim. Pt will benefit from continued physical therapy for 4 more visits 1x/week starting in january after a small break due to personal and health reasons in order to decrease risk for falls.    Personal Factors and Comorbidities  Comorbidity 3+    Comorbidities  CKD stage III, hx breast cancer, endometrial cancer    Examination-Activity Limitations  Other;Locomotion Level    Stability/Clinical  Decision Making  Stable/Uncomplicated    PT Frequency  1x / week    PT Duration  4 weeks    PT Treatment/Interventions  ADLs/Self Care Home Management;Electrical Stimulation;Gait training;Stair training;Functional mobility training;Therapeutic activities;Neuromuscular re-education;Balance training;Therapeutic exercise;Patient/family education;Manual techniques;Manual lymph drainage;Compression bandaging;Passive range of motion;Taping;Vasopneumatic Device;Joint Manipulations    PT Next Visit Plan  Perform eyes closed activities, progress core. Continue with increased difficulty w/gait and dynamic balance activities    PT Home Exercise Plan  Access Code: [email or web address]    Consulted and Agree with Plan of Care  Patient       Patient will benefit from skilled therapeutic intervention in order to improve the following deficits and impairments:  Abnormal gait, Decreased balance, Decreased endurance, Dizziness, Decreased activity tolerance, Improper body mechanics  Visit Diagnosis: Muscle weakness (generalized)  Endometrial cancer ([facility])  Other abnormalities of gait and mobility  Physical debility     Problem List Patient Active Problem List   Diagnosis Date Noted  . Hypokalemia due to excessive renal loss of potassium 06/17/2019  . Deficiency anemia 03/24/2019  . Hypokalemia due to inadequate potassium intake 03/24/2019  . Hypomagnesemia 03/24/2019  . Physical debility 03/24/2019  . Pancytopenia, acquired ([place]) 02/03/2019  . 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 Bone pain 11/10/2018  . Encounter for antineoplastic chemotherapy 10/20/2018  . 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 Goals of care, counseling/discussion 10/14/2018  . CKD (chronic kidney disease), stage  III 10/14/2018  . Genetic testing 07/14/2017  . History of breast cancer 06/25/2017  . Family history of stomach cancer   . Vaginal mass 02/13/2017  . Thyroid nodule 02/13/2017  . Pulmonary nodules/lesions,  multiple 02/13/2017  . Endometrial cancer ([place]) 09/30/2016  . HTN (hypertension) 01/17/2014  . Chest pain at rest 01/17/2014  . Hyperlipidemia 04/03/2011  . OTHER SPECIFIED DISORDER OF STOMACH AND DUODENUM 10/19/2009    Ander Purpura, PT 06/22/2019, 12:06 PM  [place], [place], [address] Phone: [phone]   Fax:  [phone]  Name: WINNELL CALDERARO MRN: [id number] Date of Birth: [birth year]

## 2019-06-30 ENCOUNTER — Ambulatory Visit (HOSPITAL_COMMUNITY)
Admission: RE | Admit: 2019-06-30 | Discharge: 2019-06-30 | Disposition: A | Discharge status: Home / Self Care | Payer: Medicare HMO | Source: Ambulatory Visit | Attending: Hematology and Oncology | Admitting: Hematology and Oncology

## 2019-06-30 ENCOUNTER — Other Ambulatory Visit: Payer: Self-pay

## 2019-06-30 DIAGNOSIS — R918 Other nonspecific abnormal finding of lung field: Secondary | ICD-10-CM | POA: Diagnosis not present

## 2019-06-30 DIAGNOSIS — E785 Hyperlipidemia, unspecified: Secondary | ICD-10-CM | POA: Diagnosis not present

## 2019-06-30 DIAGNOSIS — Z5111 Encounter for antineoplastic chemotherapy: Secondary | ICD-10-CM | POA: Diagnosis not present

## 2019-06-30 DIAGNOSIS — Z01818 Encounter for other preprocedural examination: Secondary | ICD-10-CM | POA: Insufficient documentation

## 2019-06-30 DIAGNOSIS — I10 Essential (primary) hypertension: Secondary | ICD-10-CM | POA: Diagnosis not present

## 2019-06-30 DIAGNOSIS — I700 Atherosclerosis of aorta: Secondary | ICD-10-CM | POA: Diagnosis not present

## 2019-06-30 DIAGNOSIS — I082 Rheumatic disorders of both aortic and tricuspid valves: Secondary | ICD-10-CM | POA: Diagnosis not present

## 2019-06-30 DIAGNOSIS — C541 Malignant neoplasm of endometrium: Secondary | ICD-10-CM | POA: Insufficient documentation

## 2019-06-30 DIAGNOSIS — C50912 Malignant neoplasm of unspecified site of left female breast: Secondary | ICD-10-CM | POA: Diagnosis not present

## 2019-06-30 MED ORDER — IOHEXOL 300 MG/ML  SOLN
75.0000 mL | Freq: Once | INTRAMUSCULAR | Status: AC | PRN
  Administered 2019-06-30: 75 mL via INTRAVENOUS

## 2019-06-30 MED ORDER — SODIUM CHLORIDE (PF) 0.9 % IJ SOLN
INTRAMUSCULAR | Status: AC
  Filled 2019-06-30: qty 50

## 2019-06-30 NOTE — Progress Notes (Signed)
*  PRELIMINARY RESULTS* Echocardiogram 2D Echocardiogram has been performed.  Vicki Cervantes 06/30/2019, 11:17 AM

## 2019-07-01 ENCOUNTER — Encounter: Payer: Self-pay | Admitting: Hematology and Oncology

## 2019-07-01 ENCOUNTER — Inpatient Hospital Stay (HOSPITAL_BASED_OUTPATIENT_CLINIC_OR_DEPARTMENT_OTHER): Payer: Medicare HMO | Admitting: Hematology and Oncology

## 2019-07-01 ENCOUNTER — Other Ambulatory Visit: Payer: Self-pay

## 2019-07-01 DIAGNOSIS — E876 Hypokalemia: Secondary | ICD-10-CM | POA: Diagnosis not present

## 2019-07-01 DIAGNOSIS — E785 Hyperlipidemia, unspecified: Secondary | ICD-10-CM | POA: Diagnosis not present

## 2019-07-01 DIAGNOSIS — C541 Malignant neoplasm of endometrium: Secondary | ICD-10-CM | POA: Diagnosis not present

## 2019-07-01 DIAGNOSIS — Z9221 Personal history of antineoplastic chemotherapy: Secondary | ICD-10-CM | POA: Diagnosis not present

## 2019-07-01 DIAGNOSIS — R918 Other nonspecific abnormal finding of lung field: Secondary | ICD-10-CM | POA: Diagnosis not present

## 2019-07-01 DIAGNOSIS — D630 Anemia in neoplastic disease: Secondary | ICD-10-CM

## 2019-07-01 DIAGNOSIS — C773 Secondary and unspecified malignant neoplasm of axilla and upper limb lymph nodes: Secondary | ICD-10-CM | POA: Diagnosis not present

## 2019-07-01 DIAGNOSIS — N183 Chronic kidney disease, stage 3 unspecified: Secondary | ICD-10-CM | POA: Diagnosis not present

## 2019-07-01 DIAGNOSIS — Z5112 Encounter for antineoplastic immunotherapy: Secondary | ICD-10-CM | POA: Diagnosis not present

## 2019-07-01 DIAGNOSIS — D631 Anemia in chronic kidney disease: Secondary | ICD-10-CM | POA: Diagnosis not present

## 2019-07-01 NOTE — Progress Notes (Signed)
Vicki Cervantes OFFICE PROGRESS NOTE  Patient Care Team: Burnard Bunting, [name]  ASSESSMENT & PLAN:  Endometrial cancer [facility]) I have reviewed multiple CT imaging and previous imaging studies with the patient She had achieved complete response to therapy She has virtually no side effects from treatment so far Echocardiogram is within normal limits We discussed the risk and benefits of continuing [name] as a form of maintenance treatment and she agreed to proceed I will see her every other visit We will continue close monitoring of echocardiogram every 3 months and CT imaging every 3 to 6 months  Hypomagnesemia She continues to have severe hypomagnesemia and magnesium losing state despite aggressive IV magnesium and oral magnesium replacement therapy  So far, she is not symptomatic The cause is unknown at this point  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   INTERVAL HISTORY: Please see below for problem oriented charting. She returns for follow-up to review imaging study She tolerated recent treatment well without side effects She is not symptomatic from recent low magnesium  SUMMARY OF ONCOLOGIC HISTORY: Oncology History Overview Note  Vicki Cervantes  has a remote history of left  breast cancer at age 78 but received BRCA testing approximately 5 years ago which was negative. Her cancer was treated with surgery but no radiation or chemotherapy.   Serous endometrial cancer, MSI stable ER positive, PR neg, Her2/neu 3+      Endometrial cancer ([place])  08/29/2016 Pathology Results   Endometrium, biopsy - HIGH GRADE ENDOMETRIAL CARCINOMA, SEE COMMENT. Microscopic  Comment The sections show multiple fragments of adenocarcinoma displaying glandular and papillary patterns associated with high grade cytomorphology characterized by nuclear pleomorphism, prominent nucleoli and brisk mitosis. Immunohistochemical stains show that the tumor cells are positive for vimentin, p16, p53 with increased Ki-67 expression. Estrogen and progesterone receptor stains show patchy weak positivity. No significant positivity is seen with CEA. The findings are consistent with high grade endometrial carcinoma and the overall morphology and phenotypic features favor serous carcinoma.   08/29/2016 Initial Diagnosis   She presented with postmenopausal bleeding   10/03/2016 Imaging   CT C/A/P 09/2016 IMPRESSION: 1. Thickening of the endometrial canal up to 19 mm in fundus, presumably corresponding to the patient's reported endometrial carcinoma. 2. Multiple tiny pulmonary nodules scattered throughout the lungs bilaterally measuring 4 mm or less in size. Nodules of this size are typically considered statistically likely benign. In the setting of known primary malignancy, metastatic disease to the lungs is not excluded, but is not strongly favored on today's examination. Attention on followup studies is recommended to ensure the stability or resolution of these nodules. 3. Subcentimeter low-attenuation lesion in the central aspect of segment 8 of the liver is too small to characterize. This is statistically likely a tiny cyst, but warrants attention on follow-up studies to exclude the possibility of a solitary hepatic metastasis. 4. 1.5 x 1.5 x 1.7 cm well-circumscribed lesion in the proximal stomach. This is of uncertain etiology and significance, and could represent a benign gastric polyp, however, further evaluation with nonemergent endoscopy is suggested in the near future for further evaluation. 5. **An incidental finding of potential clinical significance has been found. 1.1 x 1.6 cm thyroid  nodule in  the inferior aspect of the right lobe of the thyroid gland. Follow-up evaluation with nonemergent thyroid ultrasound is recommended in the near future to better evaluate this finding. This recommendation follows ACR consensuss guidelines: Managing Incidental Thyroid Nodules Detected on Imaging: White Paper of the ACR Incidental Thyroid Findings Committee. J Am Coll Radiol 2015;12(2):143-150.** 6. Aortic atherosclerosis, in addition to left anterior descending coronary artery disease   10/22/2016 Surgery   Robotic assisted total hysterectomy, BSO and bilateral pelvic lymphadenectomy  Final pathology revealed a 3cm polyp containing serous carcinoma but with no myometrial invasion, no LVSI and negative nodes.  Stage IA Uterine serous cancer   10/22/2016 Pathology Results   1. Lymph nodes, regional resection, right pelvic - SIX BENIGN LYMPH NODES (0/6). 2. Lymph nodes, regional resection, left pelvic - SEVEN BENIGN LYMPH NODES (0/7). 3. Uterus +/- tubes/ovaries, neoplastic, with right ovary and fallopian tube ENDOMYOMETRIUM - SEROUS CARCINOMA ARISING WITHIN AN ENDOMETRIAL POLYP - NO MYOMETRIAL INVASION IDENTIFIED - ADENOMYOSIS - LEIOMYOMA (1 CM) - SEE ONCOLOGY TABLE AND COMMENT CERVIX - CARCINOMA FOCALLY INVOLVES ENDOCERVICAL GLANDS - NABOTHIAN CYSTS RIGHT ADNEXA - BENIGN OVARY AND FALLOPIAN TUBE - NO CARCINOMA IDENTIFIED 4. Cul-de-sac biopsy - MESOTHELIAL HYPERPLASIA Microscopic Comment 3. ONCOLOGY TABLE-UTERUS, CARCINOMA OR CARCINOSARCOMA Specimen: Uterus, right fallopian tube and ovary Procedure: Total hysterectomy and right salpingo-oophorectomy Lymph node sampling performed: Bilateral pelvic regional resection Specimen integrity: Intact Maximum tumor size: 3 cm (polyp) Histologic type: Serous carcinoma Grade: High grade Myometrial invasion: Not identified Cervical stromal involvement: No, focal endocervical gland involvement Extent of involvement of other organs: Not  identified Lymph - vascular invasion: Not identified Peritoneal washings: N/A Lymph nodes: Examined: 0 Sentinel 13 Non-sentinel 13 Total Lymph nodes with metastasis: 0 Isolated tumor cells (< 0.2 mm): 0 Micrometastasis: (> 0.2 mm and < 2.0 mm): 0 Macrometastasis: (> 2.0 mm): 0 Extracapsular extension: N/A Pelvic lymph nodes: 0 involved of 13 lymph nodes. Para-aortic lymph nodes: No para-aortic nodes submitted TNM code: pT1a, pNX FIGO Stage (based on pathologic findings, needs clinical correlation): IA Comment: Immunohistochemistry for cytokeratin AE1/AE3 is performed on all of the lymph nodes (parts 1 & 2) and no metastatic carcinoma is identified.   07/04/2017 Genetic Testing   The patient had genetic testing due to a personal history of breast and uterine cancer, and a family history of stomach cancer.  The Multi-Cancer Panel was ordered. The Multi-Cancer Panel offered by [facility] includes sequencing and/or deletion duplication testing of the following 83 genes: ALK, APC, ATM, AXIN2,BAP1,  BARD1, BLM, BMPR1A, BRCA1, BRCA2, BRIP1, CASR, CDC73, CDH1, CDK4, CDKN1B, CDKN1C, CDKN2A (p14ARF), CDKN2A (p16INK4a), CEBPA, CHEK2, CTNNA1, DICER1, DIS3L2, EGFR (c.2369C>T, p.Thr790Met variant only), EPCAM (Deletion/duplication testing only), FH, FLCN, GATA2, GPC3, GREM1 (Promoter region deletion/duplication testing only), HOXB13 (c.251G>A, p.Gly84Glu), HRAS, KIT, MAX, MEN1, MET, MITF (c.952G>A, p.Glu318Lys variant only), MLH1, MSH2, MSH3, MSH6, MUTYH, NBN, NF1, NF2, NTHL1, PALB2, PDGFRA, PHOX2B, PMS2, POLD1, POLE, POT1, PRKAR1A, PTCH1, PTEN, RAD50, RAD51C, RAD51D, RB1, RECQL4, RET, RUNX1, SDHAF2, SDHA (sequence changes only), SDHB, SDHC, SDHD, SMAD4, SMARCA4, SMARCB1, SMARCE1, STK11, SUFU, TERC, TERT, TMEM127, TP53, TSC1, TSC2, VHL, WRN and WT1.   Results: No pathogenic variants were identified.  A variant of uncertain significance in the gene APC was identified.  c.791A>G (p.Gln264Arg).  The date of this  test report is 07/04/2017.     Genetic Testing   Patient has genetic testing done for MSI. 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    12/03/2017 Imaging   CT Chest/Abd/Pelvis  to follow pulmonary nodule and gastric mass IMPRESSION: 1. Stable CT of the chest. Small pulmonary nodules are unchanged when compared with previous exam. 2. No new findings identified. 3. Subcentimeter low-attenuation lesions within the liver are remain too small to characterize but are stable from prior exam. 4. Persistent indeterminate low-attenuation structure within the proximal stomach is unchanged measuring 1.4 cm. Correlation with direct visualization is advised   06/30/2018 Imaging   CT CHEST Lungs/Pleura: Stable scattered sub-cm pulmonary nodules are again seen bilaterally and are stable compared to previous studies. No new or enlarging pulmonary nodules or masses identified. No evidence of pulmonary infiltrate or pleural effusion.   08/19/2018 Echocardiogram   E[place] is done in [place]: EF 60-65%. Mild impaired relaxation. (report scanned)   09/17/2018 Relapse/Recurrence   Presented with c/o vagina discharge.  Lesion noted at the left vaginal apex 47m lesion removed Path c/w high grade serous cancer   09/17/2018 Pathology Results   Vagina, biopsy, left apex - HIGH GRADE SEROUS CARCINOMA.   09/28/2018 PET scan   1. Two hypermetabolic axial lymph nodes. Unusual site for metastatic endometrial carcinoma however the activity is more intense than typically seen in reactive adenopathy. Suggest ultrasound-guided percutaneous biopsy of the larger RIGHT axial lymph node 2. No evidence of local recurrence at the vaginal cuff.  3. No metastatic adenopathy in the abdomen or pelvis. 4. Stable small pulmonary nodules   10/09/2018 Pathology Results   Lymph node, needle/core biopsy, right axilla - METASTATIC CARCINOMA, SEE COMMENT. Microscopic Comment The carcinoma appears high grade.  Immunohistochemistry is positive for cytokeratin 7, PAX-8, and ER. Cytokeratin 20, CDX-2, PR, and GATA-3 are negative. The findings along with the history are consistent with a gynecologic primary.    10/09/2018 Procedure   Ultrasound-guided core biopsies of a right axillary lymph node.   10/14/2018 Cancer Staging   Staging form: Corpus Uteri - Carcinoma and Carcinosarcoma, AJCC 8th Edition - Clinical: Stage IVB (cT1, cN0, pM1) - Signed by GHeath Lark MD on 10/14/2018   10/19/2018 Imaging   Placement of single lumen port a cath via right internal jugular vein. The catheter tip lies at the cavo-atrial junction. A power injectable port a cath was placed and is ready for immediate use.    10/21/2018 -  Chemotherapy   The patient had carboplatin and taxol for treatment   11/11/2018 -  Chemotherapy   The patient had trastuzumab ([name]) 450 mg in sodium chloride 0.9 % 250 mL chemo infusion, 483 mg, Intravenous,  Once, 6 of 6 cycles Administration: 450 mg (11/11/2018), 378 mg (12/02/2018), 378 mg (02/03/2019), 378 mg (12/23/2018), 378 mg (01/13/2019), 378 mg (02/24/2019) trastuzumab-dkst (OGIVRI) 378 mg in sodium chloride 0.9 % 250 mL chemo infusion, 6 mg/kg = 378 mg (100 % of original dose 6 mg/kg), Intravenous,  Once, 5 of 7 cycles Dose modification: 6 mg/kg (original dose 6 mg/kg, Cycle 7, Reason: Other (see comments)) Administration: 378 mg (03/24/2019), 378 mg (04/14/2019), 378 mg (05/05/2019), 378 mg (05/26/2019), 378 mg (06/16/2019)  for chemotherapy treatment.    12/21/2018 PET scan   1. Interval resolution of hypermetabolic right axillary lymph nodes. No metabolic findings highly suspicious for recurrent metastatic disease. 2. New mild hypermetabolism within a borderline prominent portacaval lymph node, nonspecific. While a reactive node is favored, a lymph node metastasis cannot be entirely excluded. Suggest attention to this lymph node on follow-up PET-CT in 3-6 months. 3. Nonspecific new hypermetabolism  at the ileocecal valve, more likely physiologic given absence of CT correlate. 4.  Scattered subcentimeter pulmonary nodules are all stable and below PET resolution, more likely benign, continued CT surveillance advised. 5. Chronic findings include: Aortic Atherosclerosis (ICD10-I70.0). Marked diffuse colonic diverticulosis. Coronary atherosclerosis.    12/28/2018 Echocardiogram   1. The left ventricle has normal systolic function with an ejection fraction of 60-65%. The cavity size was normal. Left ventricular diastolic Doppler parameters are consistent with impaired relaxation.  2. GLS recorded as -10.7 but LV appears hyperdynamic and tracking of endocardium appears poor.  3. The right ventricle has normal systolic function. The cavity was normal. There is no increase in right ventricular wall thickness.  4. Mild thickening of the mitral valve leaflet.  5. The aortic valve was not well visualized. Mild thickening of the aortic valve. Aortic valve regurgitation is trivial by color flow Doppler.   03/23/2019 Imaging   PET 1. No findings of hypermetabolic residual/recurrent or metastatic disease. 2. Similar low-level hypermetabolism within normal sized portocaval and right inguinal nodes, favored to be reactive. 3. Ongoing stability of small bilateral pulmonary nodules, favored to be benign. Below PET resolution. 4. Coronary artery atherosclerosis. Aortic Atherosclerosis   03/26/2019 Echocardiogram   1. The left ventricle has normal systolic function, with an ejection fraction of 55-60%. The cavity size was normal. Left ventricular diastolic Doppler parameters are consistent with impaired relaxation.  2. The right ventricle has normal systolic function. The cavity was normal.  3. The mitral valve is abnormal. Mild thickening of the mitral valve leaflet. There is mild mitral annular calcification present.  4. The tricuspid valve is grossly normal.  5. The aortic valve is tricuspid. Mild  calcification of the aortic valve. No stenosis of the aortic valve.  6. The aorta is normal unless otherwise noted.  7. Normal LV systolic function; grade 1 diastolic dysfunction; DJT-70.1%.   06/30/2019 Imaging   Chest Impression:   1. No evidence thoracic metastasis. 2. Stable small benign-appearing pulmonary nodules   Abdomen / Pelvis Impression:   1. No evidence of metastatic disease in the abdomen pelvis. 2.  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 3.  Aortic Atherosclerosis (ICD10-I70.0).   06/30/2019 Echocardiogram    1. Left ventricular ejection fraction, by visual estimation, is 60 to 65%. The left ventricle has normal function. There is no left ventricular hypertrophy.  2. Left ventricular diastolic parameters are consistent with Grade I diastolic dysfunction (impaired relaxation).  3. The left ventricle has no regional wall motion abnormalities.  4. Global right ventricle has normal systolic function.The right ventricular size is normal. No increase in right ventricular wall thickness.  5. Left atrial size was mild-moderately dilated.  6. Right atrial size was normal.  7. The mitral valve is normal in structure. Trivial mitral valve regurgitation.  8. The tricuspid valve is normal in structure. Tricuspid valve regurgitation is trivial.  9. The aortic valve is normal in structure. Aortic valve regurgitation is trivial. No evidence of aortic valve sclerosis or stenosis. 10. The pulmonic valve was grossly normal. Pulmonic valve regurgitation is not visualized. 11. Mildly elevated pulmonary artery systolic pressure. 12. The inferior vena cava is normal in size with greater than 50% respiratory variability, suggesting right atrial pressure of 3 mmHg. 13. The average left ventricular global longitudinal strain is -12.5 %. GLS underestimated due to poor endocardial tracking.     Metastasis to lymph nodes ([place])  10/14/2018 Initial Diagnosis   Metastasis to lymph  nodes ([place])   10/21/2018 - 03/16/2019 Chemotherapy   The patient had palonosetron (ALOXI) injection 0.25 mg,  0.25 mg, Intravenous,  Once, 7 of 7 cycles Administration: 0.25 mg (10/21/2018), 0.25 mg (11/11/2018), 0.25 mg (12/02/2018), 0.25 mg (12/23/2018), 0.25 mg (01/13/2019), 0.25 mg (02/03/2019), 0.25 mg (02/24/2019) CARBOplatin (PARAPLATIN) 310 mg in sodium chloride 0.9 % 250 mL chemo infusion, 310 mg, Intravenous,  Once, 7 of 7 cycles Dose modification: 300 mg (original dose 311.5 mg, Cycle 4, Reason: Dose not tolerated) Administration: 310 mg (10/21/2018), 300 mg (11/11/2018), 300 mg (12/02/2018), 300 mg (12/23/2018), 300 mg (01/13/2019), 300 mg (02/03/2019), 300 mg (02/24/2019) PACLitaxel (TAXOL) 222 mg in sodium chloride 0.9 % 250 mL chemo infusion (> 32m/m2), 135 mg/m2 = 222 mg, Intravenous,  Once, 7 of 7 cycles Administration: 222 mg (10/21/2018), 222 mg (11/11/2018), 222 mg (12/02/2018), 222 mg (12/23/2018), 222 mg (01/13/2019), 222 mg (02/03/2019), 222 mg (02/24/2019) fosaprepitant (EMEND) 150 mg, dexamethasone (DECADRON) 12 mg in sodium chloride 0.9 % 145 mL IVPB, , Intravenous,  Once, 7 of 7 cycles Administration:  (10/21/2018),  (11/11/2018),  (12/02/2018),  (12/23/2018),  (01/13/2019),  (02/03/2019),  (02/24/2019)  for chemotherapy treatment.      REVIEW OF SYSTEMS:   Constitutional: Denies fevers, chills or abnormal weight loss Eyes: Denies blurriness of vision Ears, nose, mouth, throat, and face: Denies mucositis or sore throat Respiratory: Denies cough, dyspnea or wheezes Cardiovascular: Denies palpitation, chest discomfort or lower extremity swelling Gastrointestinal:  Denies nausea, heartburn or change in bowel habits Skin: Denies abnormal skin rashes Lymphatics: Denies new lymphadenopathy or easy bruising Neurological:Denies numbness, tingling or new weaknesses Behavioral/Psych: Mood is stable, no new changes  All other systems were reviewed with the patient and are negative.  I have reviewed the past  medical history, past surgical history, social history and family history with the patient and they are unchanged from previous note.  ALLERGIES:  has No Known Allergies.  MEDICATIONS:  Current Outpatient Medications  Medication Sig Dispense Refill  . acetaminophen (TYLENOL) 500 MG tablet Take 1,000 mg by mouth every 6 (six) hours as needed.    .Marland Kitchenaspirin EC 81 MG tablet Take 81 mg by mouth daily.    .Marland Kitchenatorvastatin (LIPITOR) 20 MG tablet Take 20 mg by mouth every evening.     . Calcium Carbonate-Vitamin D (CALCIUM-VITAMIN D) 500-200 MG-UNIT per tablet Take 1 tablet by mouth daily.    . citalopram (CELEXA) 20 MG tablet Take 20 mg by mouth at bedtime.     .Marland Kitchenlevothyroxine (SYNTHROID, LEVOTHROID) 100 MCG tablet Take 100 mcg by mouth daily before breakfast.     . lidocaine-prilocaine (EMLA) cream Apply to affected area once 30 g 3  . losartan-hydrochlorothiazide (HYZAAR) 100-25 MG tablet     . magnesium oxide (MAG-OX) 400 (241.3 Mg) MG tablet Take 1 tablet (400 mg total) by mouth daily. 30 tablet 11  . metoprolol succinate (TOPROL-XL) 25 MG 24 hr tablet Take 25 mg by mouth daily.    . Multiple Vitamin (MULTIVITAMIN WITH MINERALS) TABS Take 1 tablet by mouth daily.    . ondansetron (ZOFRAN) 8 MG tablet Take 1 tablet (8 mg total) by mouth every 8 (eight) hours as needed. Start on the third day after chemotherapy. 30 tablet 1  . pantoprazole (PROTONIX) 40 MG tablet Take 40 mg by mouth every morning.     . valACYclovir (VALTREX) 1000 MG tablet valacyclovir 1 gram tablet     No current facility-administered medications for this visit.    PHYSICAL EXAMINATION: ECOG PERFORMANCE STATUS: 0 - Asymptomatic  Vitals:   07/01/19 1131  BP: 126/63  Pulse:  78  Resp: 16  Temp: 98 F (36.7 C)  SpO2: 97%   Filed Weights   07/01/19 1131  Weight: 130 lb 6.4 oz (59.1 kg)    GENERAL:alert, no distress and comfortable Musculoskeletal:no cyanosis of digits and no clubbing  NEURO: alert & oriented x 3  with fluent speech, no focal motor/sensory deficits  LABORATORY DATA:  I have reviewed the data as listed    Component Value Date/Time   NA 139 06/16/2019 1020   NA 139 02/13/2017 1512   K 3.1 (L) 06/16/2019 1020   K 4.2 02/13/2017 1512   CL 99 06/16/2019 1020   CO2 25 06/16/2019 1020   CO2 28 02/13/2017 1512   GLUCOSE 125 (H) 06/16/2019 1020   GLUCOSE 118 02/13/2017 1512   BUN 22 06/16/2019 1020   BUN 25.2 02/13/2017 1512   CREATININE 1.36 (H) 06/16/2019 1020   CREATININE 1.2 (H) 02/13/2017 1512   CALCIUM 9.4 06/16/2019 1020   CALCIUM 10.0 02/13/2017 1512   PROT 7.0 06/16/2019 1020   ALBUMIN 4.1 06/16/2019 1020   AST 23 06/16/2019 1020   ALT 18 06/16/2019 1020   ALKPHOS 79 06/16/2019 1020   BILITOT 0.4 06/16/2019 1020   GFRNONAA 37 (L) 06/16/2019 1020   GFRAA 43 (L) 06/16/2019 1020    No results found for: SPEP, UPEP  Lab Results  Component Value Date   WBC 5.3 06/16/2019   NEUTROABS 3.6 06/16/2019   HGB 10.9 (L) 06/16/2019   HCT 31.2 (L) 06/16/2019   MCV 94.8 06/16/2019   PLT 168 06/16/2019      Chemistry      Component Value Date/Time   NA 139 06/16/2019 1020   NA 139 02/13/2017 1512   K 3.1 (L) 06/16/2019 1020   K 4.2 02/13/2017 1512   CL 99 06/16/2019 1020   CO2 25 06/16/2019 1020   CO2 28 02/13/2017 1512   BUN 22 06/16/2019 1020   BUN 25.2 02/13/2017 1512   CREATININE 1.36 (H) 06/16/2019 1020   CREATININE 1.2 (H) 02/13/2017 1512      Component Value Date/Time   CALCIUM 9.4 06/16/2019 1020   CALCIUM 10.0 02/13/2017 1512   ALKPHOS 79 06/16/2019 1020   AST 23 06/16/2019 1020   ALT 18 06/16/2019 1020   BILITOT 0.4 06/16/2019 1020       RADIOGRAPHIC STUDIES: I have reviewed multiple imaging studies with the patient I have personally reviewed the radiological images as listed and agreed with the findings in the report. CT Chest W Contrast  Result Date: 06/30/2019 CLINICAL DATA:  Patient diagnosed with LT breast cancer 1982, LT mastectomy with  LN removal; RT breast removed for precaution; Endometrial cancer 3/17; completed hysterectomy; patient completed chemo 10/20; Neoplasm: Endometrial carcinoma, rx monitor or follow up EXAM: CT CHEST, ABDOMEN, AND PELVIS WITH CONTRAST TECHNIQUE: Multidetector CT imaging of the chest, abdomen and pelvis was performed following the standard protocol during bolus administration of intravenous contrast. CONTRAST:  65m OMNIPAQUE IOHEXOL 300 MG/ML  SOLN COMPARISON:  PET-CT 03/23/2019 FINDINGS: CT CHEST FINDINGS Cardiovascular: Port in the anterior chest wall with tip in distal SVC. Mediastinum/Nodes: No axillary supraclavicular adenopathy nodule seen from the thyroid isthmus most concerning 14 mm unchanged no mediastinal hilar adenopathy. No pericardial effusion. Esophagus normal Lungs/Pleura: Small upper lobe nodule in the RIGHT anterior to the fissure (image 46/4) is unchanged. Angular nodule in the RIGHT middle lobe (image 80/4) is also unchanged. Small subpleural nodule in the LEFT lower lobe (image 80/4) is unchanged. No  new or suspicious pulmonary nodules. Musculoskeletal: No aggressive osseous lesion. Bilateral subglandular breast implants CT ABDOMEN AND PELVIS FINDINGS Hepatobiliary: Stable hepatic cyst in the RIGHT hepatic lobe. No suspicious lesion. Gallbladder normal. Pancreas: Pancreas is normal. No ductal dilatation. No pancreatic inflammation. Spleen: Normal spleen Adrenals/urinary tract: Adrenal glands and kidneys are normal. The ureters and bladder normal. Stomach/Bowel: Stomach, small-bowel and cecum are normal. The appendix is not identified but there is no pericecal inflammation to suggest appendicitis. Multiple diverticula of the descending colon and sigmoid colon without acute inflammation. Vascular/Lymphatic: Abdominal aorta is normal caliber with atherosclerotic calcification. There is no retroperitoneal or periportal lymphadenopathy. No pelvic lymphadenopathy. Reproductive: Post hysterectomy. No  evidence of pelvic sidewall nodularity. Other: No peritoneal nodularity. Musculoskeletal: Multiple levels of endplate sclerosis and Schmorl's nodes. No aggressive osseous lesion identified. IMPRESSION: Chest Impression: 1. No evidence thoracic metastasis. 2. Stable small benign-appearing pulmonary nodules Abdomen / Pelvis Impression: 1. No evidence of metastatic disease in the abdomen pelvis. 2.  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 3.  Aortic Atherosclerosis (ICD10-I70.0). Electronically Signed   By: Suzy Bouchard M.D.   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 CONTRAST:  58m OMNIPAQUE IOHEXOL 300 MG/ML  SOLN COMPARISON:  PET-CT 03/23/2019 FINDINGS: CT CHEST FINDINGS Cardiovascular: Port in the anterior chest wall with tip in distal SVC. Mediastinum/Nodes: No axillary supraclavicular adenopathy nodule seen from the thyroid isthmus most concerning 14 mm unchanged no mediastinal hilar adenopathy. No pericardial effusion. Esophagus normal Lungs/Pleura: Small upper lobe nodule in the RIGHT anterior to the fissure (image 46/4) is unchanged. Angular nodule in the RIGHT middle lobe (image 80/4) is also unchanged. Small subpleural nodule in the LEFT lower lobe (image 80/4) is unchanged. No new or suspicious pulmonary nodules. Musculoskeletal: No aggressive osseous lesion. Bilateral subglandular breast implants CT ABDOMEN AND PELVIS FINDINGS Hepatobiliary: Stable hepatic cyst in the RIGHT  hepatic lobe. No suspicious lesion. Gallbladder normal. Pancreas: Pancreas is normal. No ductal dilatation. No pancreatic inflammation. Spleen: Normal spleen Adrenals/urinary tract: Adrenal glands and kidneys are normal. The ureters and bladder normal. Stomach/Bowel: Stomach, small-bowel and cecum are normal. The appendix is not identified but there is no pericecal inflammation to suggest appendicitis. Multiple diverticula of the descending colon and sigmoid colon without acute inflammation. Vascular/Lymphatic: Abdominal aorta is normal caliber with atherosclerotic calcification. There is no retroperitoneal or periportal lymphadenopathy. No pelvic lymphadenopathy. Reproductive: Post hysterectomy. No evidence of pelvic sidewall nodularity. Other: No peritoneal nodularity. Musculoskeletal: Multiple levels of endplate sclerosis and Schmorl's nodes. No aggressive osseous lesion identified. IMPRESSION: Chest Impression: 1. No evidence thoracic metastasis. 2. Stable small benign-appearing pulmonary nodules Abdomen / Pelvis Impression: 1. No evidence of metastatic disease in the abdomen pelvis. 2.  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 3.  Aortic Atherosclerosis (ICD10-I70.0). Electronically Signed   By: SSuzy BouchardM.D.   On: 06/30/2019 14:05   ECHOCARDIOGRAM COMPLETE  Result Date: 06/30/2019   ECHOCARDIOGRAM REPORT   Patient Name:   SFrances MaywoodDate of Exam: 06/30/2019 Medical Rec #:  [id number]     Height:  62.2 in Accession #:    [id number]     Weight:       131.5 lb Date of Birth:  1940/10/04      BSA:          1.60 m Patient Age:    78 years       BP:           158/69 mmHg Patient Gender: F              HR:           78 bpm. Exam Location:  Outpatient Procedure: 2D Echo, Cardiac Doppler and Color Doppler Indications:    Chemo evaluation  History:        Patient has prior history of Echocardiogram examinations, most                 recent 03/26/2019. Risk Factors:Hypertension and  Dyslipidemia.                 Endometrial cancer, breast cancer, chemo.  Sonographer:    Dustin Flock Referring Phys: [phone] Bluma Buresh Ebro  1. Left ventricular ejection fraction, by visual estimation, is 60 to 65%. The left ventricle has normal function. There is no left ventricular hypertrophy.  2. Left ventricular diastolic parameters are consistent with Grade I diastolic dysfunction (impaired relaxation).  3. The left ventricle has no regional wall motion abnormalities.  4. Global right ventricle has normal systolic function.The right ventricular size is normal. No increase in right ventricular wall thickness.  5. Left atrial size was mild-moderately dilated.  6. Right atrial size was normal.  7. The mitral valve is normal in structure. Trivial mitral valve regurgitation.  8. The tricuspid valve is normal in structure. Tricuspid valve regurgitation is trivial.  9. The aortic valve is normal in structure. Aortic valve regurgitation is trivial. No evidence of aortic valve sclerosis or stenosis. 10. The pulmonic valve was grossly normal. Pulmonic valve regurgitation is not visualized. 11. Mildly elevated pulmonary artery systolic pressure. 12. The inferior vena cava is normal in size with greater than 50% respiratory variability, suggesting right atrial pressure of 3 mmHg. 13. The average left ventricular global longitudinal strain is -12.5 %. GLS underestimated due to poor endocardial tracking. FINDINGS  Left Ventricle: Left ventricular ejection fraction, by visual estimation, is 60 to 65%. The left ventricle has normal function. The average left ventricular global longitudinal strain is -12.5 %. The left ventricle has no regional wall motion abnormalities. There is no left ventricular hypertrophy. Left ventricular diastolic parameters are consistent with Grade I diastolic dysfunction (impaired relaxation). Right Ventricle: The right ventricular size is normal. No increase in right ventricular wall  thickness. Global RV systolic function is has normal systolic function. The tricuspid regurgitant velocity is 2.96 m/s, and with an assumed right atrial pressure  of 3 mmHg, the estimated right ventricular systolic pressure is mildly elevated at 38.0 mmHg. Left Atrium: Left atrial size was mild-moderately dilated. Right Atrium: Right atrial size was normal in size Pericardium: There is no evidence of pericardial effusion. Mitral Valve: The mitral valve is normal in structure. Trivial mitral valve regurgitation. Tricuspid Valve: The tricuspid valve is normal in structure. Tricuspid valve regurgitation is trivial. Aortic Valve: The aortic valve is normal in structure. Aortic valve regurgitation is trivial. Aortic regurgitation PHT measures 420 msec. The aortic valve is structurally normal, with no evidence of sclerosis or stenosis. Pulmonic Valve: The pulmonic valve was grossly normal. Pulmonic valve regurgitation is not visualized. Pulmonic  regurgitation is not visualized. Aorta: The aortic root and ascending aorta are structurally normal, with no evidence of dilitation. Venous: The inferior vena cava is normal in size with greater than 50% respiratory variability, suggesting right atrial pressure of 3 mmHg. IAS/Shunts: No atrial level shunt detected by color flow Doppler.  LEFT VENTRICLE PLAX 2D LVIDd:         3.60 cm  Diastology LVIDs:         2.60 cm  LV e' lateral:   5.22 cm/s LV PW:         0.90 cm  LV E/e' lateral: 11.8 LV IVS:        0.90 cm  LV e' medial:    5.66 cm/s LVOT diam:     2.00 cm  LV E/e' medial:  10.9 LV SV:         30 ml LV SV Index:   18.33    2D Longitudinal Strain LVOT Area:     3.14 cm 2D Strain GLS Avg:     -12.5 %  RIGHT VENTRICLE RV Basal diam:  2.40 cm RV S prime:     9.32 cm/s TAPSE (M-mode): 2.6 cm LEFT ATRIUM           Index       RIGHT ATRIUM           Index LA diam:      3.20 cm 2.00 cm/m  RA Area:     10.20 cm LA Vol (A2C): 23.8 ml 14.84 ml/m RA Volume:   22.20 ml  13.85 ml/m  LA Vol (A4C): 49.2 ml 30.69 ml/m  AORTIC VALVE LVOT Vmax:   56.10 cm/s LVOT Vmean:  39.200 cm/s LVOT VTI:    0.131 m AI PHT:      420 msec  AORTA Ao Root diam: 2.90 cm MITRAL VALVE                        TRICUSPID VALVE MV Area (PHT): 3.53 cm             TR Peak grad:   35.0 mmHg MV PHT:        62.35 msec           TR Vmax:        296.00 cm/s MV Decel Time: 215 msec MV E velocity: 61.70 cm/s 103 cm/s  SHUNTS MV A velocity: 77.10 cm/s 70.3 cm/s Systemic VTI:  0.13 m MV E/A ratio:  0.80       1.5       Systemic Diam: 2.00 cm  Glori Bickers MD Electronically signed by Glori Bickers MD Signature Date/Time: 06/30/2019/6:12:25 PM    Final     All questions were answered. The patient knows to call the clinic with any problems, questions or concerns. No barriers to learning was detected.  I spent 15 minutes counseling the patient face to face. The total time spent in the appointment was 20 minutes and more than 50% was on counseling and review of test results  [name], MD 07/01/2019 12:59 PM

## 2019-07-01 NOTE — Assessment & Plan Note (Signed)
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 

## 2019-07-01 NOTE — Assessment & Plan Note (Addendum)
She continues to have severe hypomagnesemia and magnesium losing state despite aggressive IV magnesium and oral magnesium replacement therapy  So far, she is not symptomatic The cause is unknown at this point

## 2019-07-01 NOTE — Assessment & Plan Note (Signed)
I have reviewed multiple CT imaging and previous imaging studies with the patient She had achieved complete response to therapy She has virtually no side effects from treatment so far Echocardiogram is within normal limits We discussed the risk and benefits of continuing [name] as a form of maintenance treatment and she agreed to proceed I will see her every other visit We will continue close monitoring of echocardiogram every 3 months and CT imaging every 3 to 6 months

## 2019-07-06 ENCOUNTER — Inpatient Hospital Stay: Payer: Medicare HMO

## 2019-07-06 ENCOUNTER — Other Ambulatory Visit: Payer: Self-pay

## 2019-07-06 VITALS — BP 109/90 | HR 76 | Temp 98.0°F | Resp 16

## 2019-07-06 DIAGNOSIS — Z7189 Other specified counseling: Secondary | ICD-10-CM

## 2019-07-06 DIAGNOSIS — N183 Chronic kidney disease, stage 3 unspecified: Secondary | ICD-10-CM | POA: Diagnosis not present

## 2019-07-06 DIAGNOSIS — C541 Malignant neoplasm of endometrium: Secondary | ICD-10-CM | POA: Diagnosis not present

## 2019-07-06 DIAGNOSIS — R918 Other nonspecific abnormal finding of lung field: Secondary | ICD-10-CM | POA: Diagnosis not present

## 2019-07-06 DIAGNOSIS — E785 Hyperlipidemia, unspecified: Secondary | ICD-10-CM | POA: Diagnosis not present

## 2019-07-06 DIAGNOSIS — Z5112 Encounter for antineoplastic immunotherapy: Secondary | ICD-10-CM | POA: Diagnosis not present

## 2019-07-06 DIAGNOSIS — C773 Secondary and unspecified malignant neoplasm of axilla and upper limb lymph nodes: Secondary | ICD-10-CM

## 2019-07-06 DIAGNOSIS — E876 Hypokalemia: Secondary | ICD-10-CM | POA: Diagnosis not present

## 2019-07-06 DIAGNOSIS — Z9221 Personal history of antineoplastic chemotherapy: Secondary | ICD-10-CM | POA: Diagnosis not present

## 2019-07-06 DIAGNOSIS — D631 Anemia in chronic kidney disease: Secondary | ICD-10-CM | POA: Diagnosis not present

## 2019-07-06 LAB — CBC WITH DIFFERENTIAL (CANCER CENTER ONLY)
Abs Immature Granulocytes: 0.03 10*3/uL (ref 0.00–0.07)
Basophils Absolute: 0.1 10*3/uL (ref 0.0–0.1)
Basophils Relative: 1 %
Eosinophils Absolute: 0.2 10*3/uL (ref 0.0–0.5)
Eosinophils Relative: 3 %
HCT: 31.7 % — ABNORMAL LOW (ref 36.0–46.0)
Hemoglobin: 11.1 g/dL — ABNORMAL LOW (ref 12.0–15.0)
Immature Granulocytes: 0 %
Lymphocytes Relative: 19 %
Lymphs Abs: 1.4 10*3/uL (ref 0.7–4.0)
MCH: 32.7 pg (ref 26.0–34.0)
MCHC: 35 g/dL (ref 30.0–36.0)
MCV: 93.5 fL (ref 80.0–100.0)
Monocytes Absolute: 0.6 10*3/uL (ref 0.1–1.0)
Monocytes Relative: 9 %
Neutro Abs: 5.1 10*3/uL (ref 1.7–7.7)
Neutrophils Relative %: 68 %
Platelet Count: 190 10*3/uL (ref 150–400)
RBC: 3.39 MIL/uL — ABNORMAL LOW (ref 3.87–5.11)
RDW: 11.7 % (ref 11.5–15.5)
WBC Count: 7.4 10*3/uL (ref 4.0–10.5)
nRBC: 0 % (ref 0.0–0.2)

## 2019-07-06 LAB — CMP (CANCER CENTER ONLY)
ALT: 15 U/L (ref 0–44)
AST: 22 U/L (ref 15–41)
Albumin: 4.2 g/dL (ref 3.5–5.0)
Alkaline Phosphatase: 81 U/L (ref 38–126)
Anion gap: 14 (ref 5–15)
BUN: 30 mg/dL — ABNORMAL HIGH (ref 8–23)
CO2: 25 mmol/L (ref 22–32)
Calcium: 9.2 mg/dL (ref 8.9–10.3)
Chloride: 101 mmol/L (ref 98–111)
Creatinine: 1.39 mg/dL — ABNORMAL HIGH (ref 0.44–1.00)
GFR, Est AFR Am: 42 mL/min — ABNORMAL LOW (ref >60)
GFR, Estimated: 36 mL/min — ABNORMAL LOW (ref >60)
Glucose, Bld: 105 mg/dL — ABNORMAL HIGH (ref 70–99)
Potassium: 3.5 mmol/L (ref 3.5–5.1)
Sodium: 140 mmol/L (ref 135–145)
Total Bilirubin: 0.4 mg/dL (ref 0.3–1.2)
Total Protein: 7.1 g/dL (ref 6.5–8.1)

## 2019-07-06 LAB — MAGNESIUM: Magnesium: 0.8 mg/dL — CL (ref 1.7–2.4)

## 2019-07-06 MED ORDER — ACETAMINOPHEN 325 MG PO TABS
ORAL_TABLET | ORAL | Status: AC
  Filled 2019-07-06: qty 2

## 2019-07-06 MED ORDER — SODIUM CHLORIDE 0.9% FLUSH
10.0000 mL | INTRAVENOUS | Status: DC | PRN
  Administered 2019-07-06: 10 mL
  Filled 2019-07-06: qty 10

## 2019-07-06 MED ORDER — HEPARIN SOD (PORK) LOCK FLUSH 100 UNIT/ML IV SOLN
500.0000 [IU] | Freq: Once | INTRAVENOUS | Status: AC | PRN
  Administered 2019-07-06: 500 [IU]
  Filled 2019-07-06: qty 5

## 2019-07-06 MED ORDER — DIPHENHYDRAMINE HCL 25 MG PO TABS
25.0000 mg | ORAL_TABLET | Freq: Once | ORAL | Status: AC
  Administered 2019-07-06: 15:00:00 25 mg via ORAL
  Filled 2019-07-06: qty 1

## 2019-07-06 MED ORDER — ACETAMINOPHEN 325 MG PO TABS
650.0000 mg | ORAL_TABLET | Freq: Once | ORAL | Status: AC
  Administered 2019-07-06: 650 mg via ORAL

## 2019-07-06 MED ORDER — MAGNESIUM SULFATE 2 GM/50ML IV SOLN
2.0000 g | INTRAVENOUS | Status: AC
  Administered 2019-07-06 (×2): 2 g via INTRAVENOUS

## 2019-07-06 MED ORDER — DIPHENHYDRAMINE HCL 25 MG PO CAPS
ORAL_CAPSULE | ORAL | Status: AC
  Filled 2019-07-06: qty 1

## 2019-07-06 MED ORDER — SODIUM CHLORIDE 0.9% FLUSH
10.0000 mL | Freq: Once | INTRAVENOUS | Status: AC
  Administered 2019-07-06: 10 mL
  Filled 2019-07-06: qty 10

## 2019-07-06 MED ORDER — SODIUM CHLORIDE 0.9 % IV SOLN
Freq: Once | INTRAVENOUS | Status: AC
  Filled 2019-07-06: qty 250

## 2019-07-06 MED ORDER — SODIUM CHLORIDE 0.9 % IV SOLN: 4.0000 g | Freq: Once | INTRAVENOUS | Status: DC

## 2019-07-06 MED ORDER — MAGNESIUM SULFATE 2 GM/50ML IV SOLN
INTRAVENOUS | Status: AC
  Filled 2019-07-06: qty 100

## 2019-07-06 MED ORDER — TRASTUZUMAB-DKST CHEMO 150 MG IV SOLR
6.0000 mg/kg | Freq: Once | INTRAVENOUS | Status: AC
  Administered 2019-07-06: 16:00:00 378 mg via INTRAVENOUS
  Filled 2019-07-06: qty 18

## 2019-07-06 NOTE — Patient Instructions (Signed)

## 2019-07-06 NOTE — Patient Instructions (Signed)
Trastuzumab injection for infusion What is this medicine? TRASTUZUMAB (tras TOO zoo mab) is a monoclonal antibody. It is used to treat breast cancer and stomach cancer. This medicine may be used for other purposes; ask your health care provider or pharmacist if you have questions. COMMON BRAND NAME(S): [name], Herzuma, KANJINTI, Ogivri, Ontruzant, Trazimera What should I tell my health care provider before I take this medicine? They need to know if you have any of these conditions:  heart disease  heart failure  lung or breathing disease, like asthma  an unusual or allergic reaction to trastuzumab, benzyl alcohol, or other medications, foods, dyes, or preservatives  pregnant or trying to get pregnant  breast-feeding How should I use this medicine? This drug is given as an infusion into a vein. It is administered in a hospital or clinic by a specially trained health care professional. Talk to your pediatrician regarding the use of this medicine in children. This medicine is not approved for use in children. Overdosage: If you think you have taken too much of this medicine contact a poison control center or emergency room at once. NOTE: This medicine is only for you. Do not share this medicine with others. What if I miss a dose? It is important not to miss a dose. Call your doctor or health care professional if you are unable to keep an appointment. What may interact with this medicine? This medicine may interact with the following medications:  certain types of chemotherapy, such as daunorubicin, doxorubicin, epirubicin, and idarubicin This list may not describe all possible interactions. Give your health care provider a list of all the medicines, herbs, non-prescription drugs, or dietary supplements you use. Also tell them if you smoke, drink alcohol, or use illegal drugs. Some items may interact with your medicine. What should I watch for while using this medicine? Visit your  doctor for checks on your progress. Report any side effects. Continue your course of treatment even though you feel ill unless your doctor tells you to stop. Call your doctor or health care professional for advice if you get a fever, chills or sore throat, or other symptoms of a cold or flu. Do not treat yourself. Try to avoid being around people who are sick. You may experience fever, chills and shaking during your first infusion. These effects are usually mild and can be treated with other medicines. Report any side effects during the infusion to your health care professional. Fever and chills usually do not happen with later infusions. Do not become pregnant while taking this medicine or for 7 months after stopping it. Women should inform their doctor if they wish to become pregnant or think they might be pregnant. Women of child-bearing potential will need to have a negative pregnancy test before starting this medicine. There is a potential for serious side effects to an unborn child. Talk to your health care professional or pharmacist for more information. Do not breast-feed an infant while taking this medicine or for 7 months after stopping it. Women must use effective birth control with this medicine. What side effects may I notice from receiving this medicine? Side effects that you should report to your doctor or health care professional as soon as possible:  allergic reactions like skin rash, itching or hives, swelling of the face, lips, or tongue  chest pain or palpitations  cough  dizziness  feeling faint or lightheaded, falls  fever  general ill feeling or flu-like symptoms  signs of worsening heart failure like   breathing problems; swelling in your legs and feet  unusually weak or tired Side effects that usually do not require medical attention (report to your doctor or health care professional if they continue or are bothersome):  bone pain  changes in  taste  diarrhea  joint pain  nausea/vomiting  weight loss This list may not describe all possible side effects. Call your doctor for medical advice about side effects. You may report side effects to FDA at 1-800-FDA-1088. Where should I keep my medicine? This drug is given in a hospital or clinic and will not be stored at home. NOTE: This sheet is a summary. It may not cover all possible information. If you have questions about this medicine, talk to your doctor, pharmacist, or health care provider.  2020 Elsevier/Gold Standard (2016-06-25 14:37:52)  Hypomagnesemia Hypomagnesemia is a condition in which the level of magnesium in the blood is low. Magnesium is a mineral that is found in many foods. It is used in many different processes in the body. Hypomagnesemia can affect every organ in the body. In severe cases, it can cause life-threatening problems. What are the causes? This condition may be caused by:  Not getting enough magnesium in your diet.  Malnutrition.  Problems with absorbing magnesium from the intestines.  Dehydration.  Alcohol abuse.  Vomiting.  Severe or chronic diarrhea.  Some medicines, including medicines that make you urinate more (diuretics).  Certain diseases, such as kidney disease, diabetes, celiac disease, and overactive thyroid. What are the signs or symptoms? Symptoms of this condition include:  Loss of appetite.  Nausea and vomiting.  Involuntary shaking or trembling of a body part (tremor).  Muscle weakness.  Tingling in the arms and legs.  Sudden tightening of muscles (muscle spasms).  Confusion.  Psychiatric issues, such as depression, irritability, or psychosis.  A feeling of fluttering of the heart.  Seizures. These symptoms are more severe if magnesium levels drop suddenly. How is this diagnosed? This condition may be diagnosed based on:  Your symptoms and medical history.  A physical exam.  Blood and urine  tests. How is this treated? Treatment depends on the cause and the severity of the condition. It may be treated with:  A magnesium supplement. This can be taken in pill form. If the condition is severe, magnesium is usually given through an IV.  Changes to your diet. You may be directed to eat foods that have a lot of magnesium, such as green leafy vegetables, peas, beans, and nuts.  Stopping any intake of alcohol. Follow these instructions at home:      Make sure that your diet includes foods with magnesium. Foods that have a lot of magnesium in them include: ? Green leafy vegetables, such as spinach and broccoli. ? Beans and peas. ? Nuts and seeds, such as almonds and sunflower seeds. ? Whole grains, such as whole grain bread and fortified cereals.  Take magnesium supplements if your health care provider tells you to do that. Take them as directed.  Take over-the-counter and prescription medicines only as told by your health care provider.  Have your magnesium levels monitored as told by your health care provider.  When you are active, drink fluids that contain electrolytes.  Avoid drinking alcohol.  Keep all follow-up visits as told by your health care provider. This is important. Contact a health care provider if:  You get worse instead of better.  Your symptoms return. Get help right away if you:  Develop severe muscle weakness.  Have trouble breathing.  Feel that your heart is racing. Summary  Hypomagnesemia is a condition in which the level of magnesium in the blood is low.  Hypomagnesemia can affect every organ in the body.  Treatment may include eating more foods that contain magnesium, taking magnesium supplements, and not drinking alcohol.  Have your magnesium levels monitored as told by your health care provider. This information is not intended to replace advice given to you by your health care provider. Make sure you discuss any questions you have  with your health care provider. Document Released: 03/27/2005 Document Revised: 06/13/2017 Document Reviewed: 06/02/2017 Elsevier Patient Education  2020 [place] (COVID-19) Are you at risk?  Are you at risk for the Coronavirus (COVID-19)?  To be considered HIGH RISK for Coronavirus (COVID-19), you have to meet the following criteria:  . Traveled to [place], [place], [place], [place] or [place]; or in the [place] to [place], [place], [place], or [place]; and have fever, cough, and shortness of breath within the last 2 weeks of travel OR . Been in close contact with a person diagnosed with COVID-19 within the last 2 weeks and have fever, cough, and shortness of breath . IF YOU DO NOT MEET THESE CRITERIA, YOU ARE CONSIDERED LOW RISK FOR COVID-19.  What to do if you are HIGH RISK for COVID-19?  Marland Kitchen If you are having a medical emergency, call 911. . Seek medical care right away. Before you go to a doctor's office, urgent care or emergency department, call ahead and tell them about your recent travel, contact with someone diagnosed with COVID-19, and your symptoms. You should receive instructions from your physician's office regarding next steps of care.  . When you arrive at healthcare provider, tell the healthcare staff immediately you have returned from visiting [place], [place], [place], [place] or [place]; or traveled in the [place] to [place], [place], [place], or [place]; in the last two weeks or you have been in close contact with a person diagnosed with COVID-19 in the last 2 weeks.   . Tell the health care staff about your symptoms: fever, cough and shortness of breath. . After you have been seen by a medical provider, you will be either: o Tested for (COVID-19) and discharged home on quarantine except to seek medical care if symptoms worsen, and asked to  - Stay home and avoid contact with others until you get your results (4-5 days)  - Avoid  travel on public transportation if possible (such as bus, train, or airplane) or o Sent to the Emergency Department by EMS for evaluation, COVID-19 testing, and possible admission depending on your condition and test results.  What to do if you are LOW RISK for COVID-19?  Reduce your risk of any infection by using the same precautions used for avoiding the common cold or flu:  Marland Kitchen Wash your hands often with soap and warm water for at least 20 seconds.  If soap and water are not readily available, use an alcohol-based hand sanitizer with at least 60% alcohol.  . If coughing or sneezing, cover your mouth and nose by coughing or sneezing into the elbow areas of your shirt or coat, into a tissue or into your sleeve (not your hands). . Avoid shaking hands with others and consider head nods or verbal greetings only. . Avoid touching your eyes, nose, or mouth with unwashed hands.  . Avoid close contact with people who are sick. Marland Kitchen  Avoid places or events with large numbers of people in one location, like concerts or sporting events. . Carefully consider travel plans you have or are making. . If you are planning any travel outside or inside the [place], visit the CDC's Travelers' Health webpage for the latest health notices. . If you have some symptoms but not all symptoms, continue to monitor at home and seek medical attention if your symptoms worsen. . If you are having a medical emergency, call 911.   [place] / e-Visit: [email or web address]         [facility]: [facility]: [id number]                   [facility]: [phone]

## 2019-07-07 ENCOUNTER — Other Ambulatory Visit: Payer: Medicare HMO

## 2019-07-07 ENCOUNTER — Ambulatory Visit: Payer: Medicare HMO

## 2019-07-22 ENCOUNTER — Telehealth: Payer: Self-pay | Admitting: Oncology

## 2019-07-22 NOTE — Telephone Encounter (Signed)
Called Vicki Cervantes and advised her that she should be able to get the Covid vaccine per Dr. Alvy Bimler.

## 2019-07-27 ENCOUNTER — Other Ambulatory Visit: Payer: Self-pay | Admitting: Hematology and Oncology

## 2019-07-28 ENCOUNTER — Encounter: Payer: Self-pay | Admitting: Hematology and Oncology

## 2019-07-28 ENCOUNTER — Other Ambulatory Visit: Payer: Self-pay

## 2019-07-28 ENCOUNTER — Inpatient Hospital Stay: Payer: Medicare HMO

## 2019-07-28 ENCOUNTER — Inpatient Hospital Stay: Payer: Medicare HMO | Attending: Gynecologic Oncology | Admitting: Hematology and Oncology

## 2019-07-28 ENCOUNTER — Other Ambulatory Visit: Payer: Self-pay | Admitting: Hematology and Oncology

## 2019-07-28 ENCOUNTER — Telehealth: Payer: Self-pay | Admitting: Hematology and Oncology

## 2019-07-28 DIAGNOSIS — N183 Chronic kidney disease, stage 3 unspecified: Secondary | ICD-10-CM

## 2019-07-28 DIAGNOSIS — Z853 Personal history of malignant neoplasm of breast: Secondary | ICD-10-CM | POA: Insufficient documentation

## 2019-07-28 DIAGNOSIS — Z90710 Acquired absence of both cervix and uterus: Secondary | ICD-10-CM | POA: Insufficient documentation

## 2019-07-28 DIAGNOSIS — C541 Malignant neoplasm of endometrium: Secondary | ICD-10-CM

## 2019-07-28 DIAGNOSIS — Z7982 Long term (current) use of aspirin: Secondary | ICD-10-CM | POA: Insufficient documentation

## 2019-07-28 DIAGNOSIS — Z9012 Acquired absence of left breast and nipple: Secondary | ICD-10-CM | POA: Insufficient documentation

## 2019-07-28 DIAGNOSIS — R918 Other nonspecific abnormal finding of lung field: Secondary | ICD-10-CM | POA: Insufficient documentation

## 2019-07-28 DIAGNOSIS — Z5112 Encounter for antineoplastic immunotherapy: Secondary | ICD-10-CM | POA: Diagnosis not present

## 2019-07-28 DIAGNOSIS — D6481 Anemia due to antineoplastic chemotherapy: Secondary | ICD-10-CM | POA: Diagnosis not present

## 2019-07-28 DIAGNOSIS — Z170 Estrogen receptor positive status [ER+]: Secondary | ICD-10-CM | POA: Diagnosis not present

## 2019-07-28 DIAGNOSIS — I700 Atherosclerosis of aorta: Secondary | ICD-10-CM | POA: Diagnosis not present

## 2019-07-28 DIAGNOSIS — D631 Anemia in chronic kidney disease: Secondary | ICD-10-CM | POA: Diagnosis not present

## 2019-07-28 DIAGNOSIS — I129 Hypertensive chronic kidney disease with stage 1 through stage 4 chronic kidney disease, or unspecified chronic kidney disease: Secondary | ICD-10-CM | POA: Insufficient documentation

## 2019-07-28 DIAGNOSIS — Z9079 Acquired absence of other genital organ(s): Secondary | ICD-10-CM | POA: Insufficient documentation

## 2019-07-28 DIAGNOSIS — Z9221 Personal history of antineoplastic chemotherapy: Secondary | ICD-10-CM | POA: Diagnosis not present

## 2019-07-28 DIAGNOSIS — T451X5A Adverse effect of antineoplastic and immunosuppressive drugs, initial encounter: Secondary | ICD-10-CM | POA: Diagnosis not present

## 2019-07-28 DIAGNOSIS — C773 Secondary and unspecified malignant neoplasm of axilla and upper limb lymph nodes: Secondary | ICD-10-CM | POA: Insufficient documentation

## 2019-07-28 DIAGNOSIS — Z90722 Acquired absence of ovaries, bilateral: Secondary | ICD-10-CM | POA: Diagnosis not present

## 2019-07-28 DIAGNOSIS — D630 Anemia in neoplastic disease: Secondary | ICD-10-CM

## 2019-07-28 DIAGNOSIS — Z79899 Other long term (current) drug therapy: Secondary | ICD-10-CM | POA: Diagnosis not present

## 2019-07-28 DIAGNOSIS — Z7189 Other specified counseling: Secondary | ICD-10-CM

## 2019-07-28 LAB — CMP (CANCER CENTER ONLY)
ALT: 13 U/L (ref 0–44)
AST: 17 U/L (ref 15–41)
Albumin: 4 g/dL (ref 3.5–5.0)
Alkaline Phosphatase: 75 U/L (ref 38–126)
Anion gap: 14 (ref 5–15)
BUN: 32 mg/dL — ABNORMAL HIGH (ref 8–23)
CO2: 23 mmol/L (ref 22–32)
Calcium: 8.7 mg/dL — ABNORMAL LOW (ref 8.9–10.3)
Chloride: 101 mmol/L (ref 98–111)
Creatinine: 1.4 mg/dL — ABNORMAL HIGH (ref 0.44–1.00)
GFR, Est AFR Am: 41 mL/min — ABNORMAL LOW (ref >60)
GFR, Estimated: 36 mL/min — ABNORMAL LOW (ref >60)
Glucose, Bld: 96 mg/dL (ref 70–99)
Potassium: 3.3 mmol/L — ABNORMAL LOW (ref 3.5–5.1)
Sodium: 138 mmol/L (ref 135–145)
Total Bilirubin: 0.5 mg/dL (ref 0.3–1.2)
Total Protein: 6.9 g/dL (ref 6.5–8.1)

## 2019-07-28 LAB — CBC WITH DIFFERENTIAL (CANCER CENTER ONLY)
Abs Immature Granulocytes: 0.03 10*3/uL (ref 0.00–0.07)
Basophils Absolute: 0.1 10*3/uL (ref 0.0–0.1)
Basophils Relative: 1 %
Eosinophils Absolute: 0.2 10*3/uL (ref 0.0–0.5)
Eosinophils Relative: 4 %
HCT: 31.1 % — ABNORMAL LOW (ref 36.0–46.0)
Hemoglobin: 10.8 g/dL — ABNORMAL LOW (ref 12.0–15.0)
Immature Granulocytes: 1 %
Lymphocytes Relative: 28 %
Lymphs Abs: 1.7 10*3/uL (ref 0.7–4.0)
MCH: 32.3 pg (ref 26.0–34.0)
MCHC: 34.7 g/dL (ref 30.0–36.0)
MCV: 93.1 fL (ref 80.0–100.0)
Monocytes Absolute: 0.6 10*3/uL (ref 0.1–1.0)
Monocytes Relative: 11 %
Neutro Abs: 3.3 10*3/uL (ref 1.7–7.7)
Neutrophils Relative %: 55 %
Platelet Count: 175 10*3/uL (ref 150–400)
RBC: 3.34 MIL/uL — ABNORMAL LOW (ref 3.87–5.11)
RDW: 12 % (ref 11.5–15.5)
WBC Count: 5.9 10*3/uL (ref 4.0–10.5)
nRBC: 0 % (ref 0.0–0.2)

## 2019-07-28 LAB — MAGNESIUM: Magnesium: 0.8 mg/dL — CL (ref 1.7–2.4)

## 2019-07-28 MED ORDER — ACETAMINOPHEN 325 MG PO TABS
ORAL_TABLET | ORAL | Status: AC
  Filled 2019-07-28: qty 2

## 2019-07-28 MED ORDER — HEPARIN SOD (PORK) LOCK FLUSH 100 UNIT/ML IV SOLN
500.0000 [IU] | Freq: Once | INTRAVENOUS | Status: AC | PRN
  Administered 2019-07-28: 500 [IU]
  Filled 2019-07-28: qty 5

## 2019-07-28 MED ORDER — ACETAMINOPHEN 325 MG PO TABS
650.0000 mg | ORAL_TABLET | Freq: Once | ORAL | Status: AC
  Administered 2019-07-28: 650 mg via ORAL

## 2019-07-28 MED ORDER — SODIUM CHLORIDE 0.9 % IV SOLN
6.0000 g | Freq: Once | INTRAVENOUS | Status: AC
  Administered 2019-07-28: 10:00:00 6 g via INTRAVENOUS
  Filled 2019-07-28: qty 12

## 2019-07-28 MED ORDER — SODIUM CHLORIDE 0.9 % IV SOLN
Freq: Once | INTRAVENOUS | Status: AC
  Filled 2019-07-28: qty 250

## 2019-07-28 MED ORDER — TRASTUZUMAB-DKST CHEMO 150 MG IV SOLR
6.0000 mg/kg | Freq: Once | INTRAVENOUS | Status: AC
  Administered 2019-07-28: 357 mg via INTRAVENOUS
  Filled 2019-07-28: qty 17

## 2019-07-28 MED ORDER — SODIUM CHLORIDE 0.9% FLUSH
10.0000 mL | INTRAVENOUS | Status: DC | PRN
  Administered 2019-07-28: 14:00:00 10 mL
  Filled 2019-07-28: qty 10

## 2019-07-28 MED ORDER — DIPHENHYDRAMINE HCL 25 MG PO CAPS
ORAL_CAPSULE | ORAL | Status: AC
  Filled 2019-07-28: qty 1

## 2019-07-28 MED ORDER — DIPHENHYDRAMINE HCL 25 MG PO CAPS
25.0000 mg | ORAL_CAPSULE | Freq: Once | ORAL | Status: AC
  Administered 2019-07-28: 25 mg via ORAL

## 2019-07-28 MED ORDER — SODIUM CHLORIDE 0.9% FLUSH
10.0000 mL | Freq: Once | INTRAVENOUS | Status: AC
  Administered 2019-07-28: 10 mL
  Filled 2019-07-28: qty 10

## 2019-07-28 NOTE — Patient Instructions (Signed)
Trastuzumab injection for infusion What is this medicine? TRASTUZUMAB (tras TOO zoo mab) is a monoclonal antibody. It is used to treat breast cancer and stomach cancer. This medicine may be used for other purposes; ask your health care provider or pharmacist if you have questions. COMMON BRAND NAME(S): [name], Herzuma, KANJINTI, Ogivri, Ontruzant, Trazimera What should I tell my health care provider before I take this medicine? They need to know if you have any of these conditions:  heart disease  heart failure  lung or breathing disease, like asthma  an unusual or allergic reaction to trastuzumab, benzyl alcohol, or other medications, foods, dyes, or preservatives  pregnant or trying to get pregnant  breast-feeding How should I use this medicine? This drug is given as an infusion into a vein. It is administered in a hospital or clinic by a specially trained health care professional. Talk to your pediatrician regarding the use of this medicine in children. This medicine is not approved for use in children. Overdosage: If you think you have taken too much of this medicine contact a poison control center or emergency room at once. NOTE: This medicine is only for you. Do not share this medicine with others. What if I miss a dose? It is important not to miss a dose. Call your doctor or health care professional if you are unable to keep an appointment. What may interact with this medicine? This medicine may interact with the following medications:  certain types of chemotherapy, such as daunorubicin, doxorubicin, epirubicin, and idarubicin This list may not describe all possible interactions. Give your health care provider a list of all the medicines, herbs, non-prescription drugs, or dietary supplements you use. Also tell them if you smoke, drink alcohol, or use illegal drugs. Some items may interact with your medicine. What should I watch for while using this medicine? Visit your  doctor for checks on your progress. Report any side effects. Continue your course of treatment even though you feel ill unless your doctor tells you to stop. Call your doctor or health care professional for advice if you get a fever, chills or sore throat, or other symptoms of a cold or flu. Do not treat yourself. Try to avoid being around people who are sick. You may experience fever, chills and shaking during your first infusion. These effects are usually mild and can be treated with other medicines. Report any side effects during the infusion to your health care professional. Fever and chills usually do not happen with later infusions. Do not become pregnant while taking this medicine or for 7 months after stopping it. Women should inform their doctor if they wish to become pregnant or think they might be pregnant. Women of child-bearing potential will need to have a negative pregnancy test before starting this medicine. There is a potential for serious side effects to an unborn child. Talk to your health care professional or pharmacist for more information. Do not breast-feed an infant while taking this medicine or for 7 months after stopping it. Women must use effective birth control with this medicine. What side effects may I notice from receiving this medicine? Side effects that you should report to your doctor or health care professional as soon as possible:  allergic reactions like skin rash, itching or hives, swelling of the face, lips, or tongue  chest pain or palpitations  cough  dizziness  feeling faint or lightheaded, falls  fever  general ill feeling or flu-like symptoms  signs of worsening heart failure like   breathing problems; swelling in your legs and feet  unusually weak or tired Side effects that usually do not require medical attention (report to your doctor or health care professional if they continue or are bothersome):  bone pain  changes in  taste  diarrhea  joint pain  nausea/vomiting  weight loss This list may not describe all possible side effects. Call your doctor for medical advice about side effects. You may report side effects to FDA at 1-800-FDA-1088. Where should I keep my medicine? This drug is given in a hospital or clinic and will not be stored at home. NOTE: This sheet is a summary. It may not cover all possible information. If you have questions about this medicine, talk to your doctor, pharmacist, or health care provider.  2020 Elsevier/Gold Standard (2016-06-25 14:37:52)  Hypomagnesemia Hypomagnesemia is a condition in which the level of magnesium in the blood is low. Magnesium is a mineral that is found in many foods. It is used in many different processes in the body. Hypomagnesemia can affect every organ in the body. In severe cases, it can cause life-threatening problems. What are the causes? This condition may be caused by:  Not getting enough magnesium in your diet.  Malnutrition.  Problems with absorbing magnesium from the intestines.  Dehydration.  Alcohol abuse.  Vomiting.  Severe or chronic diarrhea.  Some medicines, including medicines that make you urinate more (diuretics).  Certain diseases, such as kidney disease, diabetes, celiac disease, and overactive thyroid. What are the signs or symptoms? Symptoms of this condition include:  Loss of appetite.  Nausea and vomiting.  Involuntary shaking or trembling of a body part (tremor).  Muscle weakness.  Tingling in the arms and legs.  Sudden tightening of muscles (muscle spasms).  Confusion.  Psychiatric issues, such as depression, irritability, or psychosis.  A feeling of fluttering of the heart.  Seizures. These symptoms are more severe if magnesium levels drop suddenly. How is this diagnosed? This condition may be diagnosed based on:  Your symptoms and medical history.  A physical exam.  Blood and urine  tests. How is this treated? Treatment depends on the cause and the severity of the condition. It may be treated with:  A magnesium supplement. This can be taken in pill form. If the condition is severe, magnesium is usually given through an IV.  Changes to your diet. You may be directed to eat foods that have a lot of magnesium, such as green leafy vegetables, peas, beans, and nuts.  Stopping any intake of alcohol. Follow these instructions at home:      Make sure that your diet includes foods with magnesium. Foods that have a lot of magnesium in them include: ? Green leafy vegetables, such as spinach and broccoli. ? Beans and peas. ? Nuts and seeds, such as almonds and sunflower seeds. ? Whole grains, such as whole grain bread and fortified cereals.  Take magnesium supplements if your health care provider tells you to do that. Take them as directed.  Take over-the-counter and prescription medicines only as told by your health care provider.  Have your magnesium levels monitored as told by your health care provider.  When you are active, drink fluids that contain electrolytes.  Avoid drinking alcohol.  Keep all follow-up visits as told by your health care provider. This is important. Contact a health care provider if:  You get worse instead of better.  Your symptoms return. Get help right away if you:  Develop severe muscle weakness.  Have trouble breathing.  Feel that your heart is racing. Summary  Hypomagnesemia is a condition in which the level of magnesium in the blood is low.  Hypomagnesemia can affect every organ in the body.  Treatment may include eating more foods that contain magnesium, taking magnesium supplements, and not drinking alcohol.  Have your magnesium levels monitored as told by your health care provider. This information is not intended to replace advice given to you by your health care provider. Make sure you discuss any questions you have  with your health care provider. Document Revised: 06/13/2017 Document Reviewed: 06/02/2017 Elsevier Patient Education  2020 [place] (COVID-19) Are you at risk?  Are you at risk for the Coronavirus (COVID-19)?  To be considered HIGH RISK for Coronavirus (COVID-19), you have to meet the following criteria:  . Traveled to [place], [place], [place], [place] or [place]; or in the [place] to [place], [place], [place], or [place]; and have fever, cough, and shortness of breath within the last 2 weeks of travel OR . Been in close contact with a person diagnosed with COVID-19 within the last 2 weeks and have fever, cough, and shortness of breath . IF YOU DO NOT MEET THESE CRITERIA, YOU ARE CONSIDERED LOW RISK FOR COVID-19.  What to do if you are HIGH RISK for COVID-19?  Marland Kitchen If you are having a medical emergency, call 911. . Seek medical care right away. Before you go to a doctor's office, urgent care or emergency department, call ahead and tell them about your recent travel, contact with someone diagnosed with COVID-19, and your symptoms. You should receive instructions from your physician's office regarding next steps of care.  . When you arrive at healthcare provider, tell the healthcare staff immediately you have returned from visiting [place], [place], [place], [place] or [place]; or traveled in the [place] to [place], [place], [place], or [place]; in the last two weeks or you have been in close contact with a person diagnosed with COVID-19 in the last 2 weeks.   . Tell the health care staff about your symptoms: fever, cough and shortness of breath. . After you have been seen by a medical provider, you will be either: o Tested for (COVID-19) and discharged home on quarantine except to seek medical care if symptoms worsen, and asked to  - Stay home and avoid contact with others until you get your results (4-5 days)  - Avoid travel on public transportation if  possible (such as bus, train, or airplane) or o Sent to the Emergency Department by EMS for evaluation, COVID-19 testing, and possible admission depending on your condition and test results.  What to do if you are LOW RISK for COVID-19?  Reduce your risk of any infection by using the same precautions used for avoiding the common cold or flu:  Marland Kitchen Wash your hands often with soap and warm water for at least 20 seconds.  If soap and water are not readily available, use an alcohol-based hand sanitizer with at least 60% alcohol.  . If coughing or sneezing, cover your mouth and nose by coughing or sneezing into the elbow areas of your shirt or coat, into a tissue or into your sleeve (not your hands). . Avoid shaking hands with others and consider head nods or verbal greetings only. . Avoid touching your eyes, nose, or mouth with unwashed hands.  . Avoid close contact with people who are sick. . Avoid places or  events with large numbers of people in one location, like concerts or sporting events. . Carefully consider travel plans you have or are making. . If you are planning any travel outside or inside the [place], visit the CDC's Travelers' Health webpage for the latest health notices. . If you have some symptoms but not all symptoms, continue to monitor at home and seek medical attention if your symptoms worsen. . If you are having a medical emergency, call 911.   [place] / e-Visit: [email or web address]         [facility]: [facility]: [phone]                   [facility]: [phone]

## 2019-07-28 NOTE — Progress Notes (Signed)
[place] OFFICE PROGRESS NOTE  Patient Care Team: Burnard Bunting, [name]  ASSESSMENT & PLAN:  Endometrial cancer [facility]) She has virtually no side effects from treatment so far Echocardiogram is within normal limits We discussed the risk and benefits of continuing [name] as a form of maintenance treatment and she agreed to proceed I will see her every other visit We will continue close monitoring of echocardiogram every 3 months, due in March and CT imaging every 6 months, due in June  CKD (chronic kidney disease), stage III ([place]) Renal function is stable. Monitor closely  Anemia in neoplastic disease She has multifactorial anemia, anemia secondary to prior chemotherapy as well as chronic kidney disease Anemia is improving/stable Recent vitamin B12 level was adequate She is not symptomatic Observe only  Hypomagnesemia She continues to have severe hypomagnesemia and magnesium losing state despite aggressive IV magnesium and oral magnesium replacement therapy  So far, she is not symptomatic The cause is unknown at this point She will continue high-dose IV magnesium replacement with each visit   No orders of the defined types were placed in this encounter.   All questions were answered. The patient knows to call the clinic with any problems, questions or concerns. The total time spent in the appointment was 20 minutes encounter with patients including review of chart and various tests results, discussions about plan of care and coordination of care plan   [name], MD 07/28/2019 9:54 AM  INTERVAL HISTORY: Please see below for problem oriented charting. She returns for treatment and follow-up She denies new lymphadenopathy No infusion reaction No recent signs or symptoms of congestive heart failure such as chest pain or shortness of breath Denies leg cramps or sensation of irregular heartbeat with low magnesium  SUMMARY  OF ONCOLOGIC HISTORY: Oncology History Overview Note  Vicki Cervantes  has a remote history of left  breast cancer at age 79 but received BRCA testing approximately 5 years ago which was negative. Her cancer was treated with surgery but no radiation or chemotherapy.   Serous endometrial cancer, MSI stable ER positive, PR neg, Her2/neu 3+      Endometrial cancer ([place])  08/29/2016 Pathology Results   Endometrium, biopsy - HIGH GRADE ENDOMETRIAL CARCINOMA, SEE COMMENT. Microscopic Comment The sections show multiple fragments of adenocarcinoma displaying glandular and papillary patterns associated with high grade cytomorphology characterized by nuclear pleomorphism, prominent nucleoli and brisk mitosis. Immunohistochemical stains show that the tumor cells are positive for vimentin, p16, p53 with increased Ki-67 expression. Estrogen and progesterone receptor stains show patchy weak positivity. No significant positivity is seen with CEA. The findings are consistent with high grade endometrial carcinoma and the overall morphology and phenotypic features favor serous carcinoma.   08/29/2016 Initial Diagnosis   She presented with postmenopausal bleeding   10/03/2016 Imaging   CT C/A/P 09/2016 IMPRESSION: 1. Thickening of the endometrial canal up to 19 mm in fundus, presumably corresponding to the patient's reported endometrial carcinoma. 2. Multiple tiny pulmonary nodules scattered throughout the lungs bilaterally measuring 4 mm or less in size. Nodules of this size are typically considered statistically likely benign. In the setting of known primary malignancy, metastatic disease to the lungs is not excluded, but is not strongly favored on today's examination. Attention on followup studies is recommended to ensure the stability or resolution of these nodules. 3. Subcentimeter low-attenuation lesion in the central aspect of segment 8 of the liver is too small to characterize. This  is statistically likely a  tiny cyst, but warrants attention on follow-up studies to exclude the possibility of a solitary hepatic metastasis. 4. 1.5 x 1.5 x 1.7 cm well-circumscribed lesion in the proximal stomach. This is of uncertain etiology and significance, and could represent a benign gastric polyp, however, further evaluation with nonemergent endoscopy is suggested in the near future for further evaluation. 5. **An incidental finding of potential clinical significance has been found. 1.1 x 1.6 cm thyroid nodule in the inferior aspect of the right lobe of the thyroid gland. Follow-up evaluation with nonemergent thyroid ultrasound is recommended in the near future to better evaluate this finding. This recommendation follows ACR consensuss guidelines: Managing Incidental Thyroid Nodules Detected on Imaging: White Paper of the ACR Incidental Thyroid Findings Committee. J Am Coll Radiol 2015;12(2):143-150.** 6. Aortic atherosclerosis, in addition to left anterior descending coronary artery disease   10/22/2016 Surgery   Robotic assisted total hysterectomy, BSO and bilateral pelvic lymphadenectomy  Final pathology revealed a 3cm polyp containing serous carcinoma but with no myometrial invasion, no LVSI and negative nodes.  Stage IA Uterine serous cancer   10/22/2016 Pathology Results   1. Lymph nodes, regional resection, right pelvic - SIX BENIGN LYMPH NODES (0/6). 2. Lymph nodes, regional resection, left pelvic - SEVEN BENIGN LYMPH NODES (0/7). 3. Uterus +/- tubes/ovaries, neoplastic, with right ovary and fallopian tube ENDOMYOMETRIUM - SEROUS CARCINOMA ARISING WITHIN AN ENDOMETRIAL POLYP - NO MYOMETRIAL INVASION IDENTIFIED - ADENOMYOSIS - LEIOMYOMA (1 CM) - SEE ONCOLOGY TABLE AND COMMENT CERVIX - CARCINOMA FOCALLY INVOLVES ENDOCERVICAL GLANDS - NABOTHIAN CYSTS RIGHT ADNEXA - BENIGN OVARY AND FALLOPIAN TUBE - NO CARCINOMA IDENTIFIED 4. Cul-de-sac biopsy - MESOTHELIAL HYPERPLASIA Microscopic Comment 3.  ONCOLOGY TABLE-UTERUS, CARCINOMA OR CARCINOSARCOMA Specimen: Uterus, right fallopian tube and ovary Procedure: Total hysterectomy and right salpingo-oophorectomy Lymph node sampling performed: Bilateral pelvic regional resection Specimen integrity: Intact Maximum tumor size: 3 cm (polyp) Histologic type: Serous carcinoma Grade: High grade Myometrial invasion: Not identified Cervical stromal involvement: No, focal endocervical gland involvement Extent of involvement of other organs: Not identified Lymph - vascular invasion: Not identified Peritoneal washings: N/A Lymph nodes: Examined: 0 Sentinel 13 Non-sentinel 13 Total Lymph nodes with metastasis: 0 Isolated tumor cells (< 0.2 mm): 0 Micrometastasis: (> 0.2 mm and < 2.0 mm): 0 Macrometastasis: (> 2.0 mm): 0 Extracapsular extension: N/A Pelvic lymph nodes: 0 involved of 13 lymph nodes. Para-aortic lymph nodes: No para-aortic nodes submitted TNM code: pT1a, pNX FIGO Stage (based on pathologic findings, needs clinical correlation): IA Comment: Immunohistochemistry for cytokeratin AE1/AE3 is performed on all of the lymph nodes (parts 1 & 2) and no metastatic carcinoma is identified.   07/04/2017 Genetic Testing   The patient had genetic testing due to a personal history of breast and uterine cancer, and a family history of stomach cancer.  The Multi-Cancer Panel was ordered. The Multi-Cancer Panel offered by [facility] includes sequencing and/or deletion duplication testing of the following 83 genes: ALK, APC, ATM, AXIN2,BAP1,  BARD1, BLM, BMPR1A, BRCA1, BRCA2, BRIP1, CASR, CDC73, CDH1, CDK4, CDKN1B, CDKN1C, CDKN2A (p14ARF), CDKN2A (p16INK4a), CEBPA, CHEK2, CTNNA1, DICER1, DIS3L2, EGFR (c.2369C>T, p.Thr790Met variant only), EPCAM (Deletion/duplication testing only), FH, FLCN, GATA2, GPC3, GREM1 (Promoter region deletion/duplication testing only), HOXB13 (c.251G>A, p.Gly84Glu), HRAS, KIT, MAX, MEN1, MET, MITF (c.952G>A, p.Glu318Lys variant  only), MLH1, MSH2, MSH3, MSH6, MUTYH, NBN, NF1, NF2, NTHL1, PALB2, PDGFRA, PHOX2B, PMS2, POLD1, POLE, POT1, PRKAR1A, PTCH1, PTEN, RAD50, RAD51C, RAD51D, RB1, RECQL4, RET, RUNX1, SDHAF2, SDHA (sequence changes only), SDHB, SDHC, SDHD, SMAD4,  SMARCA4, SMARCB1, SMARCE1, STK11, SUFU, TERC, TERT, TMEM127, TP53, TSC1, TSC2, VHL, WRN and WT1.   Results: No pathogenic variants were identified.  A variant of uncertain significance in the gene APC was identified.  c.791A>G (p.Gln264Arg).  The date of this test report is 07/04/2017.     Genetic Testing   Patient has genetic testing done for MSI. 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    12/03/2017 Imaging   CT Chest/Abd/Pelvis to follow pulmonary nodule and gastric mass IMPRESSION: 1. Stable CT of the chest. Small pulmonary nodules are unchanged when compared with previous exam. 2. No new findings identified. 3. Subcentimeter low-attenuation lesions within the liver are remain too small to characterize but are stable from prior exam. 4. Persistent indeterminate low-attenuation structure within the proximal stomach is unchanged measuring 1.4 cm. Correlation with direct visualization is advised   06/30/2018 Imaging   CT CHEST Lungs/Pleura: Stable scattered sub-cm pulmonary nodules are again seen bilaterally and are stable compared to previous studies. No new or enlarging pulmonary nodules or masses identified. No evidence of pulmonary infiltrate or pleural effusion.   08/19/2018 Echocardiogram   [place] is done in [place]: EF 60-65%. Mild impaired relaxation. (report scanned)   09/17/2018 Relapse/Recurrence   Presented with c/o vagina discharge.  Lesion noted at the left vaginal apex 22m lesion removed Path c/w high grade serous cancer   09/17/2018 Pathology Results   Vagina, biopsy, left apex - HIGH GRADE SEROUS CARCINOMA.   09/28/2018 PET scan   1. Two hypermetabolic axial lymph nodes. Unusual site for metastatic  endometrial carcinoma however the activity is more intense than typically seen in reactive adenopathy. Suggest ultrasound-guided percutaneous biopsy of the larger RIGHT axial lymph node 2. No evidence of local recurrence at the vaginal cuff.  3. No metastatic adenopathy in the abdomen or pelvis. 4. Stable small pulmonary nodules   10/09/2018 Pathology Results   Lymph node, needle/core biopsy, right axilla - METASTATIC CARCINOMA, SEE COMMENT. Microscopic Comment The carcinoma appears high grade. Immunohistochemistry is positive for cytokeratin 7, PAX-8, and ER. Cytokeratin 20, CDX-2, PR, and GATA-3 are negative. The findings along with the history are consistent with a gynecologic primary.    10/09/2018 Procedure   Ultrasound-guided core biopsies of a right axillary lymph node.   10/14/2018 Cancer Staging   Staging form: Corpus Uteri - Carcinoma and Carcinosarcoma, AJCC 8th Edition - Clinical: Stage IVB (cT1, cN0, pM1) - Signed by GHeath Lark MD on 10/14/2018   10/19/2018 Imaging   Placement of single lumen port a cath via right internal jugular vein. The catheter tip lies at the cavo-atrial junction. A power injectable port a cath was placed and is ready for immediate use.    10/21/2018 -  Chemotherapy   The patient had carboplatin and taxol for treatment   11/11/2018 -  Chemotherapy   The patient had trastuzumab ([name]) 450 mg in sodium chloride 0.9 % 250 mL chemo infusion, 483 mg, Intravenous,  Once, 6 of 6 cycles Administration: 450 mg (11/11/2018), 378 mg (12/02/2018), 378 mg (02/03/2019), 378 mg (12/23/2018), 378 mg (01/13/2019), 378 mg (02/24/2019) trastuzumab-dkst (OGIVRI) 378 mg in sodium chloride 0.9 % 250 mL chemo infusion, 6 mg/kg = 378 mg (100 % of original dose 6 mg/kg), Intravenous,  Once, 7 of 9 cycles Dose modification: 6 mg/kg (original dose 6 mg/kg, Cycle 7, Reason: Other (see comments)) Administration: 378 mg (03/24/2019), 378 mg (04/14/2019), 378 mg (05/05/2019), 378 mg  (05/26/2019), 378 mg (06/16/2019), 378 mg (07/06/2019)  for chemotherapy treatment.    12/21/2018 PET scan   1. Interval resolution of hypermetabolic right axillary lymph nodes. No metabolic findings highly suspicious for recurrent metastatic disease. 2. New mild hypermetabolism within a borderline prominent portacaval lymph node, nonspecific. While a reactive node is favored, a lymph node metastasis cannot be entirely excluded. Suggest attention to this lymph node on follow-up PET-CT in 3-6 months. 3. Nonspecific new hypermetabolism at the ileocecal valve, more likely physiologic given absence of CT correlate. 4. Scattered subcentimeter pulmonary nodules are all stable and below PET resolution, more likely benign, continued CT surveillance advised. 5. Chronic findings include: Aortic Atherosclerosis (ICD10-I70.0). Marked diffuse colonic diverticulosis. Coronary atherosclerosis.    12/28/2018 Echocardiogram   1. The left ventricle has normal systolic function with an ejection fraction of 60-65%. The cavity size was normal. Left ventricular diastolic Doppler parameters are consistent with impaired relaxation.  2. GLS recorded as -10.7 but LV appears hyperdynamic and tracking of endocardium appears poor.  3. The right ventricle has normal systolic function. The cavity was normal. There is no increase in right ventricular wall thickness.  4. Mild thickening of the mitral valve leaflet.  5. The aortic valve was not well visualized. Mild thickening of the aortic valve. Aortic valve regurgitation is trivial by color flow Doppler.   03/23/2019 Imaging   PET 1. No findings of hypermetabolic residual/recurrent or metastatic disease. 2. Similar low-level hypermetabolism within normal sized portocaval and right inguinal nodes, favored to be reactive. 3. Ongoing stability of small bilateral pulmonary nodules, favored to be benign. Below PET resolution. 4. Coronary artery atherosclerosis. Aortic  Atherosclerosis   03/26/2019 Echocardiogram   1. The left ventricle has normal systolic function, with an ejection fraction of 55-60%. The cavity size was normal. Left ventricular diastolic Doppler parameters are consistent with impaired relaxation.  2. The right ventricle has normal systolic function. The cavity was normal.  3. The mitral valve is abnormal. Mild thickening of the mitral valve leaflet. There is mild mitral annular calcification present.  4. The tricuspid valve is grossly normal.  5. The aortic valve is tricuspid. Mild calcification of the aortic valve. No stenosis of the aortic valve.  6. The aorta is normal unless otherwise noted.  7. Normal LV systolic function; grade 1 diastolic dysfunction; BWI-20.3%.   06/30/2019 Imaging   Chest Impression:   1. No evidence thoracic metastasis. 2. Stable small benign-appearing pulmonary nodules   Abdomen / Pelvis Impression:   1. No evidence of metastatic disease in the abdomen pelvis. 2.  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 3.  Aortic Atherosclerosis (ICD10-I70.0).   06/30/2019 Echocardiogram    1. Left ventricular ejection fraction, by visual estimation, is 60 to 65%. The left ventricle has normal function. There is no left ventricular hypertrophy.  2. Left ventricular diastolic parameters are consistent with Grade I diastolic dysfunction (impaired relaxation).  3. The left ventricle has no regional wall motion abnormalities.  4. Global right ventricle has normal systolic function.The right ventricular size is normal. No increase in right ventricular wall thickness.  5. Left atrial size was mild-moderately dilated.  6. Right atrial size was normal.  7. The mitral valve is normal in structure. Trivial mitral valve regurgitation.  8. The tricuspid valve is normal in structure. Tricuspid valve regurgitation is trivial.  9. The aortic valve is normal in structure. Aortic valve regurgitation is trivial. No evidence  of aortic valve sclerosis or stenosis. 10. The pulmonic valve was grossly normal. Pulmonic valve regurgitation is not  visualized. 11. Mildly elevated pulmonary artery systolic pressure. 12. The inferior vena cava is normal in size with greater than 50% respiratory variability, suggesting right atrial pressure of 3 mmHg. 13. The average left ventricular global longitudinal strain is -12.5 %. GLS underestimated due to poor endocardial tracking.     Metastasis to lymph nodes ([place])  10/14/2018 Initial Diagnosis   Metastasis to lymph nodes ([place])   10/21/2018 - 03/16/2019 Chemotherapy   The patient had palonosetron (ALOXI) injection 0.25 mg, 0.25 mg, Intravenous,  Once, 7 of 7 cycles Administration: 0.25 mg (10/21/2018), 0.25 mg (11/11/2018), 0.25 mg (12/02/2018), 0.25 mg (12/23/2018), 0.25 mg (01/13/2019), 0.25 mg (02/03/2019), 0.25 mg (02/24/2019) CARBOplatin (PARAPLATIN) 310 mg in sodium chloride 0.9 % 250 mL chemo infusion, 310 mg, Intravenous,  Once, 7 of 7 cycles Dose modification: 300 mg (original dose 311.5 mg, Cycle 4, Reason: Dose not tolerated) Administration: 310 mg (10/21/2018), 300 mg (11/11/2018), 300 mg (12/02/2018), 300 mg (12/23/2018), 300 mg (01/13/2019), 300 mg (02/03/2019), 300 mg (02/24/2019) PACLitaxel (TAXOL) 222 mg in sodium chloride 0.9 % 250 mL chemo infusion (> '80mg'$ /m2), 135 mg/m2 = 222 mg, Intravenous,  Once, 7 of 7 cycles Administration: 222 mg (10/21/2018), 222 mg (11/11/2018), 222 mg (12/02/2018), 222 mg (12/23/2018), 222 mg (01/13/2019), 222 mg (02/03/2019), 222 mg (02/24/2019) fosaprepitant (EMEND) 150 mg, dexamethasone (DECADRON) 12 mg in sodium chloride 0.9 % 145 mL IVPB, , Intravenous,  Once, 7 of 7 cycles Administration:  (10/21/2018),  (11/11/2018),  (12/02/2018),  (12/23/2018),  (01/13/2019),  (02/03/2019),  (02/24/2019)  for chemotherapy treatment.      REVIEW OF SYSTEMS:   Constitutional: Denies fevers, chills or abnormal weight loss Eyes: Denies blurriness of vision Ears, nose, mouth, throat,  and face: Denies mucositis or sore throat Respiratory: Denies cough, dyspnea or wheezes Cardiovascular: Denies palpitation, chest discomfort or lower extremity swelling Gastrointestinal:  Denies nausea, heartburn or change in bowel habits Skin: Denies abnormal skin rashes Lymphatics: Denies new lymphadenopathy or easy bruising Neurological:Denies numbness, tingling or new weaknesses Behavioral/Psych: Mood is stable, no new changes  All other systems were reviewed with the patient and are negative.  I have reviewed the past medical history, past surgical history, social history and family history with the patient and they are unchanged from previous note.  ALLERGIES:  has No Known Allergies.  MEDICATIONS:  Current Outpatient Medications  Medication Sig Dispense Refill  . acetaminophen (TYLENOL) 500 MG tablet Take 1,000 mg by mouth every 6 (six) hours as needed.    Marland Kitchen aspirin EC 81 MG tablet Take 81 mg by mouth daily.    Marland Kitchen atorvastatin (LIPITOR) 20 MG tablet Take 20 mg by mouth every evening.     . Calcium Carbonate-Vitamin D (CALCIUM-VITAMIN D) 500-200 MG-UNIT per tablet Take 1 tablet by mouth daily.    . citalopram (CELEXA) 20 MG tablet Take 20 mg by mouth at bedtime.     Marland Kitchen levothyroxine (SYNTHROID, LEVOTHROID) 100 MCG tablet Take 100 mcg by mouth daily before breakfast.     . lidocaine-prilocaine (EMLA) cream Apply to affected area once 30 g 3  . losartan-hydrochlorothiazide (HYZAAR) 100-25 MG tablet     . magnesium oxide (MAG-OX) 400 (241.3 Mg) MG tablet Take 1 tablet (400 mg total) by mouth daily. 30 tablet 11  . metoprolol succinate (TOPROL-XL) 25 MG 24 hr tablet Take 25 mg by mouth daily.    . Multiple Vitamin (MULTIVITAMIN WITH MINERALS) TABS Take 1 tablet by mouth daily.    . ondansetron (ZOFRAN) 8 MG tablet Take  1 tablet (8 mg total) by mouth every 8 (eight) hours as needed. Start on the third day after chemotherapy. 30 tablet 1  . pantoprazole (PROTONIX) 40 MG tablet Take 40 mg  by mouth every morning.     . valACYclovir (VALTREX) 1000 MG tablet valacyclovir 1 gram tablet     No current facility-administered medications for this visit.   Facility-Administered Medications Ordered in Other Visits  Medication Dose Route Frequency Provider Last Rate Last Admin  . heparin lock flush 100 unit/mL  500 Units Intracatheter Once PRN Alvy Bimler, Ashlley Booher, MD      . magnesium sulfate 6 g in sodium chloride 0.9 % 500 mL  6 g Intravenous Once Precious Gilchrest, MD      . sodium chloride flush (NS) 0.9 % injection 10 mL  10 mL Intracatheter PRN Lyza Houseworth, MD      . trastuzumab-dkst (OGIVRI) 357 mg in sodium chloride 0.9 % 250 mL chemo infusion  6 mg/kg (Treatment Plan Recorded) Intravenous Once [name], MD        PHYSICAL EXAMINATION: ECOG PERFORMANCE STATUS: 1 - Symptomatic but completely ambulatory  Vitals:   07/28/19 0850  BP: 140/70  Pulse: 68  Resp: 18  Temp: 97.8 F (36.6 C)  SpO2: 97%   Filed Weights   07/28/19 0850  Weight: 131 lb (59.4 kg)    GENERAL:alert, no distress and comfortable SKIN: skin color, texture, turgor are normal, no rashes or significant lesions EYES: normal, Conjunctiva are pink and non-injected, sclera clear OROPHARYNX:no exudate, no erythema and lips, buccal mucosa, and tongue normal  NECK: supple, thyroid normal size, non-tender, without nodularity LYMPH:  no palpable lymphadenopathy in the cervical, axillary or inguinal LUNGS: clear to auscultation and percussion with normal breathing effort HEART: regular rate & rhythm and no murmurs and no lower extremity edema ABDOMEN:abdomen soft, non-tender and normal bowel sounds Musculoskeletal:no cyanosis of digits and no clubbing  NEURO: alert & oriented x 3 with fluent speech, no focal motor/sensory deficits  LABORATORY DATA:  I have reviewed the data as listed    Component Value Date/Time   NA 138 07/28/2019 0845   NA 139 02/13/2017 1512   K 3.3 (L) 07/28/2019 0845   K 4.2 02/13/2017 1512    CL 101 07/28/2019 0845   CO2 23 07/28/2019 0845   CO2 28 02/13/2017 1512   GLUCOSE 96 07/28/2019 0845   GLUCOSE 118 02/13/2017 1512   BUN 32 (H) 07/28/2019 0845   BUN 25.2 02/13/2017 1512   CREATININE 1.40 (H) 07/28/2019 0845   CREATININE 1.2 (H) 02/13/2017 1512   CALCIUM 8.7 (L) 07/28/2019 0845   CALCIUM 10.0 02/13/2017 1512   PROT 6.9 07/28/2019 0845   ALBUMIN 4.0 07/28/2019 0845   AST 17 07/28/2019 0845   ALT 13 07/28/2019 0845   ALKPHOS 75 07/28/2019 0845   BILITOT 0.5 07/28/2019 0845   GFRNONAA 36 (L) 07/28/2019 0845   GFRAA 41 (L) 07/28/2019 0845    No results found for: SPEP, UPEP  Lab Results  Component Value Date   WBC 5.9 07/28/2019   NEUTROABS 3.3 07/28/2019   HGB 10.8 (L) 07/28/2019   HCT 31.1 (L) 07/28/2019   MCV 93.1 07/28/2019   PLT 175 07/28/2019      Chemistry      Component Value Date/Time   NA 138 07/28/2019 0845   NA 139 02/13/2017 1512   K 3.3 (L) 07/28/2019 0845   K 4.2 02/13/2017 1512   CL 101 07/28/2019 0845  CO2 23 07/28/2019 0845   CO2 28 02/13/2017 1512   BUN 32 (H) 07/28/2019 0845   BUN 25.2 02/13/2017 1512   CREATININE 1.40 (H) 07/28/2019 0845   CREATININE 1.2 (H) 02/13/2017 1512      Component Value Date/Time   CALCIUM 8.7 (L) 07/28/2019 0845   CALCIUM 10.0 02/13/2017 1512   ALKPHOS 75 07/28/2019 0845   AST 17 07/28/2019 0845   ALT 13 07/28/2019 0845   BILITOT 0.5 07/28/2019 0845       RADIOGRAPHIC STUDIES: I have personally reviewed the radiological images as listed and agreed with the findings in the report. CT Chest W Contrast  Result Date: 06/30/2019 CLINICAL DATA:  Patient diagnosed with LT breast cancer 1982, LT mastectomy with LN removal; RT breast removed for precaution; Endometrial cancer 3/17; completed hysterectomy; patient completed chemo 10/20; Neoplasm: Endometrial carcinoma, rx monitor or follow up EXAM: CT CHEST, ABDOMEN, AND PELVIS WITH CONTRAST TECHNIQUE: Multidetector CT imaging of the chest, abdomen and  pelvis was performed following the standard protocol during bolus administration of intravenous contrast. CONTRAST:  72m OMNIPAQUE IOHEXOL 300 MG/ML  SOLN COMPARISON:  PET-CT 03/23/2019 FINDINGS: CT CHEST FINDINGS Cardiovascular: Port in the anterior chest wall with tip in distal SVC. Mediastinum/Nodes: No axillary supraclavicular adenopathy nodule seen from the thyroid isthmus most concerning 14 mm unchanged no mediastinal hilar adenopathy. No pericardial effusion. Esophagus normal Lungs/Pleura: Small upper lobe nodule in the RIGHT anterior to the fissure (image 46/4) is unchanged. Angular nodule in the RIGHT middle lobe (image 80/4) is also unchanged. Small subpleural nodule in the LEFT lower lobe (image 80/4) is unchanged. No new or suspicious pulmonary nodules. Musculoskeletal: No aggressive osseous lesion. Bilateral subglandular breast implants CT ABDOMEN AND PELVIS FINDINGS Hepatobiliary: Stable hepatic cyst in the RIGHT hepatic lobe. No suspicious lesion. Gallbladder normal. Pancreas: Pancreas is normal. No ductal dilatation. No pancreatic inflammation. Spleen: Normal spleen Adrenals/urinary tract: Adrenal glands and kidneys are normal. The ureters and bladder normal. Stomach/Bowel: Stomach, small-bowel and cecum are normal. The appendix is not identified but there is no pericecal inflammation to suggest appendicitis. Multiple diverticula of the descending colon and sigmoid colon without acute inflammation. Vascular/Lymphatic: Abdominal aorta is normal caliber with atherosclerotic calcification. There is no retroperitoneal or periportal lymphadenopathy. No pelvic lymphadenopathy. Reproductive: Post hysterectomy. No evidence of pelvic sidewall nodularity. Other: No peritoneal nodularity. Musculoskeletal: Multiple levels of endplate sclerosis and Schmorl's nodes. No aggressive osseous lesion identified. IMPRESSION: Chest Impression: 1. No evidence thoracic metastasis. 2. Stable small benign-appearing pulmonary  nodules Abdomen / Pelvis Impression: 1. No evidence of metastatic disease in the abdomen pelvis. 2.  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 3.  Aortic Atherosclerosis (ICD10-I70.0). Electronically Signed   By: SSuzy BouchardM.D.   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 CONTRAST:  777mOMNIPAQUE IOHEXOL 300 MG/ML  SOLN COMPARISON:  PET-CT 03/23/2019 FINDINGS: CT CHEST FINDINGS Cardiovascular: Port in the anterior chest wall with tip in distal SVC. Mediastinum/Nodes: No axillary supraclavicular adenopathy nodule seen from the thyroid isthmus most concerning 14 mm unchanged no mediastinal hilar adenopathy.  No pericardial effusion. Esophagus normal Lungs/Pleura: Small upper lobe nodule in the RIGHT anterior to the fissure (image 46/4) is unchanged. Angular nodule in the RIGHT middle lobe (image 80/4) is also unchanged. Small subpleural nodule in the LEFT lower lobe (image 80/4) is unchanged. No new or suspicious pulmonary nodules. Musculoskeletal: No aggressive osseous lesion. Bilateral subglandular breast implants CT ABDOMEN AND PELVIS FINDINGS Hepatobiliary: Stable hepatic cyst in the RIGHT hepatic lobe. No suspicious lesion. Gallbladder normal. Pancreas: Pancreas is normal. No ductal dilatation. No pancreatic inflammation. Spleen: Normal spleen Adrenals/urinary tract: Adrenal glands and kidneys are normal. The ureters and bladder normal. Stomach/Bowel: Stomach, small-bowel and cecum  are normal. The appendix is not identified but there is no pericecal inflammation to suggest appendicitis. Multiple diverticula of the descending colon and sigmoid colon without acute inflammation. Vascular/Lymphatic: Abdominal aorta is normal caliber with atherosclerotic calcification. There is no retroperitoneal or periportal lymphadenopathy. No pelvic lymphadenopathy. Reproductive: Post hysterectomy. No evidence of pelvic sidewall nodularity. Other: No peritoneal nodularity. Musculoskeletal: Multiple levels of endplate sclerosis and Schmorl's nodes. No aggressive osseous lesion identified. IMPRESSION: Chest Impression: 1. No evidence thoracic metastasis. 2. Stable small benign-appearing pulmonary nodules Abdomen / Pelvis Impression: 1. No evidence of metastatic disease in the abdomen pelvis. 2.  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 3.  Aortic Atherosclerosis (ICD10-I70.0). Electronically Signed   By: Suzy Bouchard M.D.   On: 06/30/2019 14:05   ECHOCARDIOGRAM COMPLETE  Result Date: 06/30/2019   ECHOCARDIOGRAM REPORT   Patient Name:   Vicki Cervantes Date of Exam: 06/30/2019 Medical Rec #:  [id number]      Height:       62.2 in Accession #:    [id number]     Weight:       131.5 lb Date of Birth:  [birth year]      BSA:          1.60 m Patient Age:    79 years       BP:           158/69 mmHg Patient Gender: F              HR:           78 bpm. Exam Location:  Outpatient Procedure: 2D Echo, Cardiac Doppler and Color Doppler Indications:    Chemo evaluation  History:        Patient has prior history of Echocardiogram examinations, most                 recent 03/26/2019. Risk Factors:Hypertension and Dyslipidemia.                 Endometrial cancer, breast cancer, chemo.  Sonographer:    Dustin Flock Referring Phys: [phone] [place]  1. Left ventricular ejection fraction, by visual estimation, is 60 to 65%. The left ventricle has normal function. There is no left ventricular  hypertrophy.  2. Left ventricular diastolic parameters are consistent with Grade I diastolic dysfunction (impaired relaxation).  3. The left ventricle has no regional wall motion abnormalities.  4. Global right ventricle has normal systolic function.The right ventricular size is normal. No increase in right ventricular wall thickness.  5. Left atrial size was mild-moderately dilated.  6. Right atrial size was normal.  7. The mitral valve is normal in structure. Trivial mitral valve regurgitation.  8. The tricuspid valve is normal in structure. Tricuspid valve regurgitation is trivial.  9. The aortic valve is normal in structure. Aortic valve regurgitation is  trivial. No evidence of aortic valve sclerosis or stenosis. 10. The pulmonic valve was grossly normal. Pulmonic valve regurgitation is not visualized. 11. Mildly elevated pulmonary artery systolic pressure. 12. The inferior vena cava is normal in size with greater than 50% respiratory variability, suggesting right atrial pressure of 3 mmHg. 13. The average left ventricular global longitudinal strain is -12.5 %. GLS underestimated due to poor endocardial tracking. FINDINGS  Left Ventricle: Left ventricular ejection fraction, by visual estimation, is 60 to 65%. The left ventricle has normal function. The average left ventricular global longitudinal strain is -12.5 %. The left ventricle has no regional wall motion abnormalities. There is no left ventricular hypertrophy. Left ventricular diastolic parameters are consistent with Grade I diastolic dysfunction (impaired relaxation). Right Ventricle: The right ventricular size is normal. No increase in right ventricular wall thickness. Global RV systolic function is has normal systolic function. The tricuspid regurgitant velocity is 2.96 m/s, and with an assumed right atrial pressure  of 3 mmHg, the estimated right ventricular systolic pressure is mildly elevated at 38.0 mmHg. Left Atrium: Left atrial size was  mild-moderately dilated. Right Atrium: Right atrial size was normal in size Pericardium: There is no evidence of pericardial effusion. Mitral Valve: The mitral valve is normal in structure. Trivial mitral valve regurgitation. Tricuspid Valve: The tricuspid valve is normal in structure. Tricuspid valve regurgitation is trivial. Aortic Valve: The aortic valve is normal in structure. Aortic valve regurgitation is trivial. Aortic regurgitation PHT measures 420 msec. The aortic valve is structurally normal, with no evidence of sclerosis or stenosis. Pulmonic Valve: The pulmonic valve was grossly normal. Pulmonic valve regurgitation is not visualized. Pulmonic regurgitation is not visualized. Aorta: The aortic root and ascending aorta are structurally normal, with no evidence of dilitation. Venous: The inferior vena cava is normal in size with greater than 50% respiratory variability, suggesting right atrial pressure of 3 mmHg. IAS/Shunts: No atrial level shunt detected by color flow Doppler.  LEFT VENTRICLE PLAX 2D LVIDd:         3.60 cm  Diastology LVIDs:         2.60 cm  LV e' lateral:   5.22 cm/s LV PW:         0.90 cm  LV E/e' lateral: 11.8 LV IVS:        0.90 cm  LV e' medial:    5.66 cm/s LVOT diam:     2.00 cm  LV E/e' medial:  10.9 LV SV:         30 ml LV SV Index:   18.33    2D Longitudinal Strain LVOT Area:     3.14 cm 2D Strain GLS Avg:     -12.5 %  RIGHT VENTRICLE RV Basal diam:  2.40 cm RV S prime:     9.32 cm/s TAPSE (M-mode): 2.6 cm LEFT ATRIUM           Index       RIGHT ATRIUM           Index LA diam:      3.20 cm 2.00 cm/m  RA Area:     10.20 cm LA Vol (A2C): 23.8 ml 14.84 ml/m RA Volume:   22.20 ml  13.85 ml/m LA Vol (A4C): 49.2 ml 30.69 ml/m  AORTIC VALVE LVOT Vmax:   56.10 cm/s LVOT Vmean:  39.200 cm/s LVOT VTI:    0.131 m AI PHT:      420 msec  AORTA Ao Root diam: 2.90 cm MITRAL VALVE  TRICUSPID VALVE MV Area (PHT): 3.53 cm             TR Peak grad:   35.0 mmHg MV PHT:         62.35 msec           TR Vmax:        296.00 cm/s MV Decel Time: 215 msec MV E velocity: 61.70 cm/s 103 cm/s  SHUNTS MV A velocity: 77.10 cm/s 70.3 cm/s Systemic VTI:  0.13 m MV E/A ratio:  0.80       1.5       Systemic Diam: 2.00 cm  Glori Bickers MD Electronically signed by Glori Bickers MD Signature Date/Time: 06/30/2019/6:12:25 PM    Final

## 2019-07-28 NOTE — Assessment & Plan Note (Signed)
Renal function is stable. Monitor closely 

## 2019-07-28 NOTE — Telephone Encounter (Signed)
Scheduled appt per 1/13 sch message - pt to get an updated schedule in chemo . Pt RN aware to give new print out

## 2019-07-28 NOTE — Assessment & Plan Note (Signed)
She has virtually no side effects from treatment so far Echocardiogram is within normal limits We discussed the risk and benefits of continuing [name] as a form of maintenance treatment and she agreed to proceed I will see her every other visit We will continue close monitoring of echocardiogram every 3 months, due in March and CT imaging every 6 months, due in June

## 2019-07-28 NOTE — Assessment & Plan Note (Signed)
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 

## 2019-07-28 NOTE — Assessment & Plan Note (Signed)
She continues to have severe hypomagnesemia and magnesium losing state despite aggressive IV magnesium and oral magnesium replacement therapy  So far, she is not symptomatic The cause is unknown at this point She will continue high-dose IV magnesium replacement with each visit

## 2019-07-28 NOTE — Progress Notes (Signed)
LATE ENTRY- At 9:45 am, Diane from lab called with a critical magnesium level of 0.8.  Walked over to the desk nurse for Dr. Alvy Bimler, Hassan Rowan, to deliver this value and she acknowledged receipt. Gardiner Rhyme

## 2019-07-29 ENCOUNTER — Ambulatory Visit: Payer: Medicare Other | Attending: Internal Medicine

## 2019-07-29 DIAGNOSIS — Z23 Encounter for immunization: Secondary | ICD-10-CM | POA: Insufficient documentation

## 2019-07-29 NOTE — Progress Notes (Signed)
   Covid-19 Vaccination Clinic  Name:  Vicki Cervantes    MRN: [id number] DOB: 1941-01-29  07/29/2019  [name] was observed post Covid-19 immunization for 15 minutes without incidence. She was provided with Vaccine Information Sheet and instruction to access the V-Safe system.   [name] was instructed to call 911 with any severe reactions post vaccine: Marland Kitchen Difficulty breathing  . Swelling of your face and throat  . A fast heartbeat  . A bad rash all over your body  . Dizziness and weakness    Immunizations Administered    Name Date Dose VIS Date Route   Pfizer COVID-19 Vaccine 07/29/2019 10:09 AM 0.3 mL 06/25/2019 Intramuscular   Manufacturer: [employer]   Lot: [id number]   [place]: [id number]

## 2019-07-30 ENCOUNTER — Ambulatory Visit: Payer: Medicare HMO

## 2019-08-03 DIAGNOSIS — C541 Malignant neoplasm of endometrium: Secondary | ICD-10-CM | POA: Diagnosis not present

## 2019-08-16 ENCOUNTER — Ambulatory Visit: Payer: Medicare HMO | Attending: Internal Medicine

## 2019-08-16 ENCOUNTER — Ambulatory Visit: Payer: Medicare HMO

## 2019-08-16 DIAGNOSIS — Z23 Encounter for immunization: Secondary | ICD-10-CM | POA: Insufficient documentation

## 2019-08-16 NOTE — Progress Notes (Signed)
   Covid-19 Vaccination Clinic  Name:  Vicki Cervantes    MRN: [id number] DOB: 23-May-1941  08/16/2019  Vicki Cervantes was observed post Covid-19 immunization for 15 minutes without incidence. She was provided with Vaccine Information Sheet and instruction to access the V-Safe system.   Vicki Cervantes was instructed to call 911 with any severe reactions post vaccine: Marland Kitchen Difficulty breathing  . Swelling of your face and throat  . A fast heartbeat  . A bad rash all over your body  . Dizziness and weakness    Immunizations Administered    Name Date Dose VIS Date Route   Pfizer COVID-19 Vaccine 08/16/2019 11:08 AM 0.3 mL 06/25/2019 Intramuscular   Manufacturer: [place]   Lot: [id number]   [place]: [id number]

## 2019-08-18 ENCOUNTER — Inpatient Hospital Stay: Payer: Medicare HMO

## 2019-08-18 ENCOUNTER — Other Ambulatory Visit: Payer: Self-pay

## 2019-08-18 ENCOUNTER — Inpatient Hospital Stay: Payer: Medicare HMO | Attending: Gynecologic Oncology

## 2019-08-18 VITALS — BP 132/66 | HR 68 | Temp 97.5°F | Resp 18 | Wt 129.8 lb

## 2019-08-18 DIAGNOSIS — N183 Chronic kidney disease, stage 3 unspecified: Secondary | ICD-10-CM | POA: Diagnosis not present

## 2019-08-18 DIAGNOSIS — C541 Malignant neoplasm of endometrium: Secondary | ICD-10-CM | POA: Diagnosis present

## 2019-08-18 DIAGNOSIS — Z79899 Other long term (current) drug therapy: Secondary | ICD-10-CM | POA: Insufficient documentation

## 2019-08-18 DIAGNOSIS — Z5112 Encounter for antineoplastic immunotherapy: Secondary | ICD-10-CM | POA: Insufficient documentation

## 2019-08-18 DIAGNOSIS — D631 Anemia in chronic kidney disease: Secondary | ICD-10-CM | POA: Insufficient documentation

## 2019-08-18 DIAGNOSIS — C773 Secondary and unspecified malignant neoplasm of axilla and upper limb lymph nodes: Secondary | ICD-10-CM | POA: Diagnosis not present

## 2019-08-18 DIAGNOSIS — Z90722 Acquired absence of ovaries, bilateral: Secondary | ICD-10-CM | POA: Diagnosis not present

## 2019-08-18 DIAGNOSIS — Z90710 Acquired absence of both cervix and uterus: Secondary | ICD-10-CM | POA: Insufficient documentation

## 2019-08-18 DIAGNOSIS — Z7982 Long term (current) use of aspirin: Secondary | ICD-10-CM | POA: Diagnosis not present

## 2019-08-18 DIAGNOSIS — Z7189 Other specified counseling: Secondary | ICD-10-CM

## 2019-08-18 DIAGNOSIS — R918 Other nonspecific abnormal finding of lung field: Secondary | ICD-10-CM | POA: Diagnosis not present

## 2019-08-18 DIAGNOSIS — Z9079 Acquired absence of other genital organ(s): Secondary | ICD-10-CM | POA: Insufficient documentation

## 2019-08-18 LAB — CBC WITH DIFFERENTIAL (CANCER CENTER ONLY)
Abs Immature Granulocytes: 0.02 10*3/uL (ref 0.00–0.07)
Basophils Absolute: 0.1 10*3/uL (ref 0.0–0.1)
Basophils Relative: 1 %
Eosinophils Absolute: 0.1 10*3/uL (ref 0.0–0.5)
Eosinophils Relative: 3 %
HCT: 31 % — ABNORMAL LOW (ref 36.0–46.0)
Hemoglobin: 10.8 g/dL — ABNORMAL LOW (ref 12.0–15.0)
Immature Granulocytes: 0 %
Lymphocytes Relative: 21 %
Lymphs Abs: 1 10*3/uL (ref 0.7–4.0)
MCH: 31.9 pg (ref 26.0–34.0)
MCHC: 34.8 g/dL (ref 30.0–36.0)
MCV: 91.4 fL (ref 80.0–100.0)
Monocytes Absolute: 0.6 10*3/uL (ref 0.1–1.0)
Monocytes Relative: 12 %
Neutro Abs: 2.9 10*3/uL (ref 1.7–7.7)
Neutrophils Relative %: 63 %
Platelet Count: 155 10*3/uL (ref 150–400)
RBC: 3.39 MIL/uL — ABNORMAL LOW (ref 3.87–5.11)
RDW: 11.9 % (ref 11.5–15.5)
WBC Count: 4.7 10*3/uL (ref 4.0–10.5)
nRBC: 0 % (ref 0.0–0.2)

## 2019-08-18 LAB — MAGNESIUM: Magnesium: 0.8 mg/dL — CL (ref 1.7–2.4)

## 2019-08-18 LAB — CMP (CANCER CENTER ONLY)
ALT: 14 U/L (ref 0–44)
AST: 21 U/L (ref 15–41)
Albumin: 4 g/dL (ref 3.5–5.0)
Alkaline Phosphatase: 73 U/L (ref 38–126)
Anion gap: 11 (ref 5–15)
BUN: 22 mg/dL (ref 8–23)
CO2: 27 mmol/L (ref 22–32)
Calcium: 9.3 mg/dL (ref 8.9–10.3)
Chloride: 101 mmol/L (ref 98–111)
Creatinine: 1.31 mg/dL — ABNORMAL HIGH (ref 0.44–1.00)
GFR, Est AFR Am: 45 mL/min — ABNORMAL LOW (ref >60)
GFR, Estimated: 39 mL/min — ABNORMAL LOW (ref >60)
Glucose, Bld: 111 mg/dL — ABNORMAL HIGH (ref 70–99)
Potassium: 3.5 mmol/L (ref 3.5–5.1)
Sodium: 139 mmol/L (ref 135–145)
Total Bilirubin: 0.3 mg/dL (ref 0.3–1.2)
Total Protein: 6.9 g/dL (ref 6.5–8.1)

## 2019-08-18 MED ORDER — DIPHENHYDRAMINE HCL 25 MG PO CAPS
25.0000 mg | ORAL_CAPSULE | Freq: Once | ORAL | Status: AC
  Administered 2019-08-18: 25 mg via ORAL

## 2019-08-18 MED ORDER — ACETAMINOPHEN 325 MG PO TABS
650.0000 mg | ORAL_TABLET | Freq: Once | ORAL | Status: AC
  Administered 2019-08-18: 650 mg via ORAL

## 2019-08-18 MED ORDER — TRASTUZUMAB-DKST CHEMO 150 MG IV SOLR
6.0000 mg/kg | Freq: Once | INTRAVENOUS | Status: AC
  Administered 2019-08-18: 357 mg via INTRAVENOUS
  Filled 2019-08-18: qty 17

## 2019-08-18 MED ORDER — DIPHENHYDRAMINE HCL 25 MG PO CAPS
ORAL_CAPSULE | ORAL | Status: AC
  Filled 2019-08-18: qty 1

## 2019-08-18 MED ORDER — HEPARIN SOD (PORK) LOCK FLUSH 100 UNIT/ML IV SOLN
500.0000 [IU] | Freq: Once | INTRAVENOUS | Status: AC | PRN
  Administered 2019-08-18: 500 [IU]
  Filled 2019-08-18: qty 5

## 2019-08-18 MED ORDER — SODIUM CHLORIDE 0.9% FLUSH
10.0000 mL | Freq: Once | INTRAVENOUS | Status: AC
  Administered 2019-08-18: 10 mL
  Filled 2019-08-18: qty 10

## 2019-08-18 MED ORDER — ACETAMINOPHEN 325 MG PO TABS
ORAL_TABLET | ORAL | Status: AC
  Filled 2019-08-18: qty 2

## 2019-08-18 MED ORDER — SODIUM CHLORIDE 0.9 % IV SOLN
6.0000 g | Freq: Once | INTRAVENOUS | Status: AC
  Administered 2019-08-18: 6 g via INTRAVENOUS
  Filled 2019-08-18: qty 12

## 2019-08-18 MED ORDER — SODIUM CHLORIDE 0.9% FLUSH
10.0000 mL | INTRAVENOUS | Status: DC | PRN
  Administered 2019-08-18: 10 mL
  Filled 2019-08-18: qty 10

## 2019-08-18 MED ORDER — SODIUM CHLORIDE 0.9 % IV SOLN
Freq: Once | INTRAVENOUS | Status: AC
  Filled 2019-08-18: qty 250

## 2019-08-18 NOTE — Patient Instructions (Signed)
[facility] Discharge Instructions for Patients Receiving Chemotherapy  Today you received the following chemotherapy agents: trastuzumab and magnesium  To help prevent nausea and vomiting after your treatment, we encourage you to take your nausea medication as directed.   If you develop nausea and vomiting that is not controlled by your nausea medication, call the clinic.   BELOW ARE SYMPTOMS THAT SHOULD BE REPORTED IMMEDIATELY:  *FEVER GREATER THAN 100.5 F  *CHILLS WITH OR WITHOUT FEVER  NAUSEA AND VOMITING THAT IS NOT CONTROLLED WITH YOUR NAUSEA MEDICATION  *UNUSUAL SHORTNESS OF BREATH  *UNUSUAL BRUISING OR BLEEDING  TENDERNESS IN MOUTH AND THROAT WITH OR WITHOUT PRESENCE OF ULCERS  *URINARY PROBLEMS  *BOWEL PROBLEMS  UNUSUAL RASH Items with * indicate a potential emergency and should be followed up as soon as possible.  Feel free to call the clinic should you have any questions or concerns. The clinic phone number is [phone].  Please show the [facility] at check-in to the Emergency Department and triage nurse.

## 2019-08-19 ENCOUNTER — Ambulatory Visit: Payer: Medicare HMO

## 2019-08-19 ENCOUNTER — Other Ambulatory Visit: Payer: Medicare HMO

## 2019-08-24 DIAGNOSIS — Z85828 Personal history of other malignant neoplasm of skin: Secondary | ICD-10-CM | POA: Diagnosis not present

## 2019-08-24 DIAGNOSIS — L570 Actinic keratosis: Secondary | ICD-10-CM | POA: Diagnosis not present

## 2019-08-24 DIAGNOSIS — D485 Neoplasm of uncertain behavior of skin: Secondary | ICD-10-CM | POA: Diagnosis not present

## 2019-08-24 DIAGNOSIS — D044 Carcinoma in situ of skin of scalp and neck: Secondary | ICD-10-CM | POA: Diagnosis not present

## 2019-09-01 DIAGNOSIS — R69 Illness, unspecified: Secondary | ICD-10-CM | POA: Diagnosis not present

## 2019-09-07 ENCOUNTER — Encounter: Payer: Self-pay | Admitting: Hematology and Oncology

## 2019-09-07 ENCOUNTER — Inpatient Hospital Stay (HOSPITAL_BASED_OUTPATIENT_CLINIC_OR_DEPARTMENT_OTHER): Payer: Medicare HMO | Admitting: Hematology and Oncology

## 2019-09-07 ENCOUNTER — Inpatient Hospital Stay: Payer: Medicare HMO

## 2019-09-07 ENCOUNTER — Other Ambulatory Visit: Payer: Self-pay

## 2019-09-07 ENCOUNTER — Inpatient Hospital Stay: Payer: Medicare HMO | Admitting: Hematology and Oncology

## 2019-09-07 ENCOUNTER — Telehealth: Payer: Self-pay

## 2019-09-07 VITALS — BP 129/66 | HR 72 | Temp 97.8°F | Resp 16 | Wt 129.0 lb

## 2019-09-07 DIAGNOSIS — N183 Chronic kidney disease, stage 3 unspecified: Secondary | ICD-10-CM | POA: Diagnosis not present

## 2019-09-07 DIAGNOSIS — R918 Other nonspecific abnormal finding of lung field: Secondary | ICD-10-CM | POA: Diagnosis not present

## 2019-09-07 DIAGNOSIS — C773 Secondary and unspecified malignant neoplasm of axilla and upper limb lymph nodes: Secondary | ICD-10-CM

## 2019-09-07 DIAGNOSIS — Z7189 Other specified counseling: Secondary | ICD-10-CM

## 2019-09-07 DIAGNOSIS — Z5112 Encounter for antineoplastic immunotherapy: Secondary | ICD-10-CM | POA: Diagnosis not present

## 2019-09-07 DIAGNOSIS — C541 Malignant neoplasm of endometrium: Secondary | ICD-10-CM

## 2019-09-07 DIAGNOSIS — D630 Anemia in neoplastic disease: Secondary | ICD-10-CM

## 2019-09-07 DIAGNOSIS — Z90710 Acquired absence of both cervix and uterus: Secondary | ICD-10-CM | POA: Diagnosis not present

## 2019-09-07 DIAGNOSIS — D631 Anemia in chronic kidney disease: Secondary | ICD-10-CM | POA: Diagnosis not present

## 2019-09-07 DIAGNOSIS — Z7982 Long term (current) use of aspirin: Secondary | ICD-10-CM | POA: Diagnosis not present

## 2019-09-07 DIAGNOSIS — Z79899 Other long term (current) drug therapy: Secondary | ICD-10-CM | POA: Diagnosis not present

## 2019-09-07 LAB — CBC WITH DIFFERENTIAL (CANCER CENTER ONLY)
Abs Immature Granulocytes: 0.03 10*3/uL (ref 0.00–0.07)
Basophils Absolute: 0.1 10*3/uL (ref 0.0–0.1)
Basophils Relative: 1 %
Eosinophils Absolute: 0.2 10*3/uL (ref 0.0–0.5)
Eosinophils Relative: 3 %
HCT: 30.6 % — ABNORMAL LOW (ref 36.0–46.0)
Hemoglobin: 10.6 g/dL — ABNORMAL LOW (ref 12.0–15.0)
Immature Granulocytes: 1 %
Lymphocytes Relative: 21 %
Lymphs Abs: 1.4 10*3/uL (ref 0.7–4.0)
MCH: 31.8 pg (ref 26.0–34.0)
MCHC: 34.6 g/dL (ref 30.0–36.0)
MCV: 91.9 fL (ref 80.0–100.0)
Monocytes Absolute: 0.7 10*3/uL (ref 0.1–1.0)
Monocytes Relative: 10 %
Neutro Abs: 4.2 10*3/uL (ref 1.7–7.7)
Neutrophils Relative %: 64 %
Platelet Count: 196 10*3/uL (ref 150–400)
RBC: 3.33 MIL/uL — ABNORMAL LOW (ref 3.87–5.11)
RDW: 11.9 % (ref 11.5–15.5)
WBC Count: 6.5 10*3/uL (ref 4.0–10.5)
nRBC: 0 % (ref 0.0–0.2)

## 2019-09-07 LAB — MAGNESIUM: Magnesium: 0.9 mg/dL — CL (ref 1.7–2.4)

## 2019-09-07 LAB — CMP (CANCER CENTER ONLY)
ALT: 13 U/L (ref 0–44)
AST: 19 U/L (ref 15–41)
Albumin: 4 g/dL (ref 3.5–5.0)
Alkaline Phosphatase: 66 U/L (ref 38–126)
Anion gap: 11 (ref 5–15)
BUN: 29 mg/dL — ABNORMAL HIGH (ref 8–23)
CO2: 26 mmol/L (ref 22–32)
Calcium: 9.2 mg/dL (ref 8.9–10.3)
Chloride: 102 mmol/L (ref 98–111)
Creatinine: 1.4 mg/dL — ABNORMAL HIGH (ref 0.44–1.00)
GFR, Est AFR Am: 41 mL/min — ABNORMAL LOW (ref >60)
GFR, Estimated: 36 mL/min — ABNORMAL LOW (ref >60)
Glucose, Bld: 109 mg/dL — ABNORMAL HIGH (ref 70–99)
Potassium: 3.5 mmol/L (ref 3.5–5.1)
Sodium: 139 mmol/L (ref 135–145)
Total Bilirubin: 0.4 mg/dL (ref 0.3–1.2)
Total Protein: 6.8 g/dL (ref 6.5–8.1)

## 2019-09-07 MED ORDER — SODIUM CHLORIDE 0.9 % IV SOLN
Freq: Once | INTRAVENOUS | Status: AC
  Filled 2019-09-07: qty 250

## 2019-09-07 MED ORDER — SODIUM CHLORIDE 0.9% FLUSH
10.0000 mL | Freq: Once | INTRAVENOUS | Status: AC
  Administered 2019-09-07: 10 mL
  Filled 2019-09-07: qty 10

## 2019-09-07 MED ORDER — ACETAMINOPHEN 325 MG PO TABS
ORAL_TABLET | ORAL | Status: AC
  Filled 2019-09-07: qty 2

## 2019-09-07 MED ORDER — HEPARIN SOD (PORK) LOCK FLUSH 100 UNIT/ML IV SOLN
500.0000 [IU] | Freq: Once | INTRAVENOUS | Status: AC
  Administered 2019-09-07: 500 [IU]
  Filled 2019-09-07: qty 5

## 2019-09-07 MED ORDER — TRASTUZUMAB-DKST CHEMO 150 MG IV SOLR
6.0000 mg/kg | Freq: Once | INTRAVENOUS | Status: AC
  Administered 2019-09-07: 357 mg via INTRAVENOUS
  Filled 2019-09-07: qty 17

## 2019-09-07 MED ORDER — DIPHENHYDRAMINE HCL 25 MG PO TABS
25.0000 mg | ORAL_TABLET | Freq: Once | ORAL | Status: AC
  Administered 2019-09-07: 25 mg via ORAL
  Filled 2019-09-07: qty 1

## 2019-09-07 MED ORDER — DIPHENHYDRAMINE HCL 25 MG PO CAPS
ORAL_CAPSULE | ORAL | Status: AC
  Filled 2019-09-07: qty 1

## 2019-09-07 MED ORDER — SODIUM CHLORIDE 0.9 % IV SOLN
6.0000 g | Freq: Once | INTRAVENOUS | Status: AC
  Administered 2019-09-07: 6 g via INTRAVENOUS
  Filled 2019-09-07: qty 12

## 2019-09-07 MED ORDER — ACETAMINOPHEN 325 MG PO TABS
650.0000 mg | ORAL_TABLET | Freq: Once | ORAL | Status: AC
  Administered 2019-09-07: 650 mg via ORAL

## 2019-09-07 NOTE — Assessment & Plan Note (Signed)
Renal function is stable. Monitor closely 

## 2019-09-07 NOTE — Assessment & Plan Note (Signed)
She continues to have severe hypomagnesemia and magnesium losing state despite aggressive IV magnesium and oral magnesium replacement therapy  So far, she is not symptomatic The cause is unknown at this point She will continue high-dose IV magnesium replacement with each visit

## 2019-09-07 NOTE — Assessment & Plan Note (Signed)
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 

## 2019-09-07 NOTE — Assessment & Plan Note (Signed)
She has virtually no side effects from treatment so far Echocardiogram is within normal limits We discussed the risk and benefits of continuing [name] as a form of maintenance treatment and she agreed to proceed I will see her every other visit I plan to repeat imaging study next month If imaging studies show good response to treatment, I will repeat echocardiogram

## 2019-09-07 NOTE — Telephone Encounter (Signed)
Per MD recommendations - reschedule appointments from 2/23 to 3/2 due to patient not showing.    RN left message with patient to call back re: appointments.

## 2019-09-07 NOTE — Patient Instructions (Signed)
[facility] Discharge Instructions for Patients Receiving Chemotherapy  Today you received the following chemotherapy agents trastuzumab.  To help prevent nausea and vomiting after your treatment, we encourage you to take your nausea medication as directed.    If you develop nausea and vomiting that is not controlled by your nausea medication, call the clinic.   BELOW ARE SYMPTOMS THAT SHOULD BE REPORTED IMMEDIATELY:  *FEVER GREATER THAN 100.5 F  *CHILLS WITH OR WITHOUT FEVER  NAUSEA AND VOMITING THAT IS NOT CONTROLLED WITH YOUR NAUSEA MEDICATION  *UNUSUAL SHORTNESS OF BREATH  *UNUSUAL BRUISING OR BLEEDING  TENDERNESS IN MOUTH AND THROAT WITH OR WITHOUT PRESENCE OF ULCERS  *URINARY PROBLEMS  *BOWEL PROBLEMS  UNUSUAL RASH Items with * indicate a potential emergency and should be followed up as soon as possible.  Feel free to call the clinic should you have any questions or concerns. The clinic phone number is [phone].  Please show the CHEMO ALERT CARD at check-in to the Emergency Department and triage nurse.   

## 2019-09-07 NOTE — Progress Notes (Signed)
[place] OFFICE PROGRESS NOTE  Patient Care Team: Vicki Bunting, [name]  ASSESSMENT & PLAN:  Endometrial cancer [facility]) She has virtually no side effects from treatment so far Echocardiogram is within normal limits We discussed the risk and benefits of continuing [name] as a form of maintenance treatment and she agreed to proceed I will see her every other visit I plan to repeat imaging study next month If imaging studies show good response to treatment, I will repeat echocardiogram  Anemia in neoplastic disease She has multifactorial anemia, anemia secondary to prior chemotherapy as well as chronic kidney disease Anemia is improving/stable Recent vitamin B12 level was adequate She is not symptomatic Observe only  CKD (chronic kidney disease), stage III ([place]) Renal function is stable. Monitor closely  Hypomagnesemia She continues to have severe hypomagnesemia and magnesium losing state despite aggressive IV magnesium and oral magnesium replacement therapy  So far, she is not symptomatic The cause is unknown at this point She will continue high-dose IV magnesium replacement with each visit   Orders Placed This Encounter  Procedures  . CT CHEST W CONTRAST    Standing Status:   Future    Standing Expiration Date:   09/06/2020    Order Specific Question:   If indicated for the ordered procedure, I authorize the administration of contrast media per Radiology protocol    Answer:   Yes    Order Specific Question:   Preferred imaging location?    Answer:   [facility]    Order Specific Question:   Radiology Contrast Protocol - do NOT remove file path    Answer:   \\[email or web address]  . CT ABDOMEN PELVIS W CONTRAST    Standing Status:   Future    Standing Expiration Date:   09/06/2020    Order Specific Question:   If indicated for the ordered procedure, I authorize the administration of contrast  media per Radiology protocol    Answer:   Yes    Order Specific Question:   Preferred imaging location?    Answer:   [facility]    Order Specific Question:   Radiology Contrast Protocol - do NOT remove file path    Answer:   \\[email or web address]    All questions were answered. The patient knows to call the clinic with any problems, questions or concerns. The total time spent in the appointment was 20 minutes encounter with patients including review of chart and various tests results, discussions about plan of care and coordination of care plan   [name], MD 09/07/2019 2:07 PM  INTERVAL HISTORY: Please see below for problem oriented charting. She returns for further follow-up She is doing well No side effects from treatment so far She is not symptomatic from low magnesium Denies abdominal pain, swelling, nausea or recent changes in bowel habits No recent signs or symptoms of congestive heart failure  SUMMARY OF ONCOLOGIC HISTORY: Oncology History Overview Note  Vicki Cervantes  has a remote history of left  breast cancer at age 48 but received BRCA testing approximately 5 years ago which was negative. Her cancer was treated with surgery but no radiation or chemotherapy.   Serous endometrial cancer, MSI stable ER positive, PR neg, Her2/neu 3+      Endometrial cancer ([place])  08/29/2016 Pathology Results   Endometrium, biopsy - HIGH GRADE ENDOMETRIAL CARCINOMA, SEE COMMENT. Microscopic Comment The sections show multiple fragments of adenocarcinoma displaying glandular and papillary patterns  associated with high grade cytomorphology characterized by nuclear pleomorphism, prominent nucleoli and brisk mitosis. Immunohistochemical stains show that the tumor cells are positive for vimentin, p16, p53 with increased Ki-67 expression. Estrogen and progesterone receptor stains show patchy weak positivity. No significant positivity is seen with CEA. The  findings are consistent with high grade endometrial carcinoma and the overall morphology and phenotypic features favor serous carcinoma.   08/29/2016 Initial Diagnosis   She presented with postmenopausal bleeding   10/03/2016 Imaging   CT C/A/P 09/2016 IMPRESSION: 1. Thickening of the endometrial canal up to 19 mm in fundus, presumably corresponding to the patient's reported endometrial carcinoma. 2. Multiple tiny pulmonary nodules scattered throughout the lungs bilaterally measuring 4 mm or less in size. Nodules of this size are typically considered statistically likely benign. In the setting of known primary malignancy, metastatic disease to the lungs is not excluded, but is not strongly favored on today's examination. Attention on followup studies is recommended to ensure the stability or resolution of these nodules. 3. Subcentimeter low-attenuation lesion in the central aspect of segment 8 of the liver is too small to characterize. This is statistically likely a tiny cyst, but warrants attention on follow-up studies to exclude the possibility of a solitary hepatic metastasis. 4. 1.5 x 1.5 x 1.7 cm well-circumscribed lesion in the proximal stomach. This is of uncertain etiology and significance, and could represent a benign gastric polyp, however, further evaluation with nonemergent endoscopy is suggested in the near future for further evaluation. 5. **An incidental finding of potential clinical significance has been found. 1.1 x 1.6 cm thyroid nodule in the inferior aspect of the right lobe of the thyroid gland. Follow-up evaluation with nonemergent thyroid ultrasound is recommended in the near future to better evaluate this finding. This recommendation follows ACR consensuss guidelines: Managing Incidental Thyroid Nodules Detected on Imaging: White Paper of the ACR Incidental Thyroid Findings Committee. J Am Coll Radiol 2015;12(2):143-150.** 6. Aortic atherosclerosis, in addition to left anterior  descending coronary artery disease   10/22/2016 Surgery   Robotic assisted total hysterectomy, BSO and bilateral pelvic lymphadenectomy  Final pathology revealed a 3cm polyp containing serous carcinoma but with no myometrial invasion, no LVSI and negative nodes.  Stage IA Uterine serous cancer   10/22/2016 Pathology Results   1. Lymph nodes, regional resection, right pelvic - SIX BENIGN LYMPH NODES (0/6). 2. Lymph nodes, regional resection, left pelvic - SEVEN BENIGN LYMPH NODES (0/7). 3. Uterus +/- tubes/ovaries, neoplastic, with right ovary and fallopian tube ENDOMYOMETRIUM - SEROUS CARCINOMA ARISING WITHIN AN ENDOMETRIAL POLYP - NO MYOMETRIAL INVASION IDENTIFIED - ADENOMYOSIS - LEIOMYOMA (1 CM) - SEE ONCOLOGY TABLE AND COMMENT CERVIX - CARCINOMA FOCALLY INVOLVES ENDOCERVICAL GLANDS - NABOTHIAN CYSTS RIGHT ADNEXA - BENIGN OVARY AND FALLOPIAN TUBE - NO CARCINOMA IDENTIFIED 4. Cul-de-sac biopsy - MESOTHELIAL HYPERPLASIA Microscopic Comment 3. ONCOLOGY TABLE-UTERUS, CARCINOMA OR CARCINOSARCOMA Specimen: Uterus, right fallopian tube and ovary Procedure: Total hysterectomy and right salpingo-oophorectomy Lymph node sampling performed: Bilateral pelvic regional resection Specimen integrity: Intact Maximum tumor size: 3 cm (polyp) Histologic type: Serous carcinoma Grade: High grade Myometrial invasion: Not identified Cervical stromal involvement: No, focal endocervical gland involvement Extent of involvement of other organs: Not identified Lymph - vascular invasion: Not identified Peritoneal washings: N/A Lymph nodes: Examined: 0 Sentinel 13 Non-sentinel 13 Total Lymph nodes with metastasis: 0 Isolated tumor cells (< 0.2 mm): 0 Micrometastasis: (> 0.2 mm and < 2.0 mm): 0 Macrometastasis: (> 2.0 mm): 0 Extracapsular extension: N/A Pelvic lymph nodes: 0 involved of  13 lymph nodes. Para-aortic lymph nodes: No para-aortic nodes submitted TNM code: pT1a, pNX FIGO Stage  (based on pathologic findings, needs clinical correlation): IA Comment: Immunohistochemistry for cytokeratin AE1/AE3 is performed on all of the lymph nodes (parts 1 & 2) and no metastatic carcinoma is identified.   07/04/2017 Genetic Testing   The patient had genetic testing due to a personal history of breast and uterine cancer, and a family history of stomach cancer.  The Multi-Cancer Panel was ordered. The Multi-Cancer Panel offered by [facility] includes sequencing and/or deletion duplication testing of the following 83 genes: ALK, APC, ATM, AXIN2,BAP1,  BARD1, BLM, BMPR1A, BRCA1, BRCA2, BRIP1, CASR, CDC73, CDH1, CDK4, CDKN1B, CDKN1C, CDKN2A (p14ARF), CDKN2A (p16INK4a), CEBPA, CHEK2, CTNNA1, DICER1, DIS3L2, EGFR (c.2369C>T, p.Thr790Met variant only), EPCAM (Deletion/duplication testing only), FH, FLCN, GATA2, GPC3, GREM1 (Promoter region deletion/duplication testing only), HOXB13 (c.251G>A, p.Gly84Glu), HRAS, KIT, MAX, MEN1, MET, MITF (c.952G>A, p.Glu318Lys variant only), MLH1, MSH2, MSH3, MSH6, MUTYH, NBN, NF1, NF2, NTHL1, PALB2, PDGFRA, PHOX2B, PMS2, POLD1, POLE, POT1, PRKAR1A, PTCH1, PTEN, RAD50, RAD51C, RAD51D, RB1, RECQL4, RET, RUNX1, SDHAF2, SDHA (sequence changes only), SDHB, SDHC, SDHD, SMAD4, SMARCA4, SMARCB1, SMARCE1, STK11, SUFU, TERC, TERT, TMEM127, TP53, TSC1, TSC2, VHL, WRN and WT1.   Results: No pathogenic variants were identified.  A variant of uncertain significance in the gene APC was identified.  c.791A>G (p.Gln264Arg).  The date of this test report is 07/04/2017.     Genetic Testing   Patient has genetic testing done for MSI. 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    12/03/2017 Imaging   CT Chest/Abd/Pelvis to follow pulmonary nodule and gastric mass IMPRESSION: 1. Stable CT of the chest. Small pulmonary nodules are unchanged when compared with previous exam. 2. No new findings identified. 3. Subcentimeter low-attenuation lesions within the  liver are remain too small to characterize but are stable from prior exam. 4. Persistent indeterminate low-attenuation structure within the proximal stomach is unchanged measuring 1.4 cm. Correlation with direct visualization is advised   06/30/2018 Imaging   CT CHEST Lungs/Pleura: Stable scattered sub-cm pulmonary nodules are again seen bilaterally and are stable compared to previous studies. No new or enlarging pulmonary nodules or masses identified. No evidence of pulmonary infiltrate or pleural effusion.   08/19/2018 Echocardiogram   [place] done in [place]: EF 60-65%. Mild impaired relaxation. (report scanned)   09/17/2018 Relapse/Recurrence   Presented with c/o vagina discharge.  Lesion noted at the left vaginal apex 75m lesion removed Path c/w high grade serous cancer   09/17/2018 Pathology Results   Vagina, biopsy, left apex - HIGH GRADE SEROUS CARCINOMA.   09/28/2018 PET scan   1. Two hypermetabolic axial lymph nodes. Unusual site for metastatic endometrial carcinoma however the activity is more intense than typically seen in reactive adenopathy. Suggest ultrasound-guided percutaneous biopsy of the larger RIGHT axial lymph node 2. No evidence of local recurrence at the vaginal cuff.  3. No metastatic adenopathy in the abdomen or pelvis. 4. Stable small pulmonary nodules   10/09/2018 Pathology Results   Lymph node, needle/core biopsy, right axilla - METASTATIC CARCINOMA, SEE COMMENT. Microscopic Comment The carcinoma appears high grade. Immunohistochemistry is positive for cytokeratin 7, PAX-8, and ER. Cytokeratin 20, CDX-2, PR, and GATA-3 are negative. The findings along with the history are consistent with a gynecologic primary.    10/09/2018 Procedure   Ultrasound-guided core biopsies of a right axillary lymph node.   10/14/2018 Cancer Staging   Staging form: Corpus Uteri - Carcinoma and Carcinosarcoma,  AJCC 8th Edition - Clinical: Stage IVB (cT1, cN0, pM1) - Signed by [name], MD  on 10/14/2018   10/19/2018 Imaging   Placement of single lumen port a cath via right internal jugular vein. The catheter tip lies at the cavo-atrial junction. A power injectable port a cath was placed and is ready for immediate use.    10/21/2018 -  Chemotherapy   The patient had carboplatin and taxol for treatment   11/11/2018 -  Chemotherapy   The patient had trastuzumab ([name]) 450 mg in sodium chloride 0.9 % 250 mL chemo infusion, 483 mg, Intravenous,  Once, 6 of 6 cycles Administration: 450 mg (11/11/2018), 378 mg (12/02/2018), 378 mg (02/03/2019), 378 mg (12/23/2018), 378 mg (01/13/2019), 378 mg (02/24/2019) trastuzumab-dkst (OGIVRI) 378 mg in sodium chloride 0.9 % 250 mL chemo infusion, 6 mg/kg = 378 mg (100 % of original dose 6 mg/kg), Intravenous,  Once, 9 of 10 cycles Dose modification: 6 mg/kg (original dose 6 mg/kg, Cycle 7, Reason: Other (see comments)) Administration: 378 mg (03/24/2019), 378 mg (04/14/2019), 378 mg (05/05/2019), 378 mg (05/26/2019), 378 mg (06/16/2019), 378 mg (07/06/2019), 357 mg (07/28/2019), 357 mg (08/18/2019)  for chemotherapy treatment.    12/21/2018 PET scan   1. Interval resolution of hypermetabolic right axillary lymph nodes. No metabolic findings highly suspicious for recurrent metastatic disease. 2. New mild hypermetabolism within a borderline prominent portacaval lymph node, nonspecific. While a reactive node is favored, a lymph node metastasis cannot be entirely excluded. Suggest attention to this lymph node on follow-up PET-CT in 3-6 months. 3. Nonspecific new hypermetabolism at the ileocecal valve, more likely physiologic given absence of CT correlate. 4. Scattered subcentimeter pulmonary nodules are all stable and below PET resolution, more likely benign, continued CT surveillance advised. 5. Chronic findings include: Aortic Atherosclerosis (ICD10-I70.0). Marked diffuse colonic diverticulosis. Coronary atherosclerosis.    12/28/2018 Echocardiogram   1. The left  ventricle has normal systolic function with an ejection fraction of 60-65%. The cavity size was normal. Left ventricular diastolic Doppler parameters are consistent with impaired relaxation.  2. GLS recorded as -10.7 but LV appears hyperdynamic and tracking of endocardium appears poor.  3. The right ventricle has normal systolic function. The cavity was normal. There is no increase in right ventricular wall thickness.  4. Mild thickening of the mitral valve leaflet.  5. The aortic valve was not well visualized. Mild thickening of the aortic valve. Aortic valve regurgitation is trivial by color flow Doppler.   03/23/2019 Imaging   PET 1. No findings of hypermetabolic residual/recurrent or metastatic disease. 2. Similar low-level hypermetabolism within normal sized portocaval and right inguinal nodes, favored to be reactive. 3. Ongoing stability of small bilateral pulmonary nodules, favored to be benign. Below PET resolution. 4. Coronary artery atherosclerosis. Aortic Atherosclerosis   03/26/2019 Echocardiogram   1. The left ventricle has normal systolic function, with an ejection fraction of 55-60%. The cavity size was normal. Left ventricular diastolic Doppler parameters are consistent with impaired relaxation.  2. The right ventricle has normal systolic function. The cavity was normal.  3. The mitral valve is abnormal. Mild thickening of the mitral valve leaflet. There is mild mitral annular calcification present.  4. The tricuspid valve is grossly normal.  5. The aortic valve is tricuspid. Mild calcification of the aortic valve. No stenosis of the aortic valve.  6. The aorta is normal unless otherwise noted.  7. Normal LV systolic function; grade 1 diastolic dysfunction; ZOX-09.6%.   06/30/2019 Imaging   Chest Impression:  1. No evidence thoracic metastasis. 2. Stable small benign-appearing pulmonary nodules   Abdomen / Pelvis Impression:   1. No evidence of metastatic disease in the  abdomen pelvis. 2.  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 3.  Aortic Atherosclerosis (ICD10-I70.0).   06/30/2019 Echocardiogram    1. Left ventricular ejection fraction, by visual estimation, is 60 to 65%. The left ventricle has normal function. There is no left ventricular hypertrophy.  2. Left ventricular diastolic parameters are consistent with Grade I diastolic dysfunction (impaired relaxation).  3. The left ventricle has no regional wall motion abnormalities.  4. Global right ventricle has normal systolic function.The right ventricular size is normal. No increase in right ventricular wall thickness.  5. Left atrial size was mild-moderately dilated.  6. Right atrial size was normal.  7. The mitral valve is normal in structure. Trivial mitral valve regurgitation.  8. The tricuspid valve is normal in structure. Tricuspid valve regurgitation is trivial.  9. The aortic valve is normal in structure. Aortic valve regurgitation is trivial. No evidence of aortic valve sclerosis or stenosis. 10. The pulmonic valve was grossly normal. Pulmonic valve regurgitation is not visualized. 11. Mildly elevated pulmonary artery systolic pressure. 12. The inferior vena cava is normal in size with greater than 50% respiratory variability, suggesting right atrial pressure of 3 mmHg. 13. The average left ventricular global longitudinal strain is -12.5 %. GLS underestimated due to poor endocardial tracking.     Metastasis to lymph nodes ([place])  10/14/2018 Initial Diagnosis   Metastasis to lymph nodes ([place])   10/21/2018 - 03/16/2019 Chemotherapy   The patient had palonosetron (ALOXI) injection 0.25 mg, 0.25 mg, Intravenous,  Once, 7 of 7 cycles Administration: 0.25 mg (10/21/2018), 0.25 mg (11/11/2018), 0.25 mg (12/02/2018), 0.25 mg (12/23/2018), 0.25 mg (01/13/2019), 0.25 mg (02/03/2019), 0.25 mg (02/24/2019) CARBOplatin (PARAPLATIN) 310 mg in sodium chloride 0.9 % 250 mL chemo infusion, 310 mg, Intravenous,   Once, 7 of 7 cycles Dose modification: 300 mg (original dose 311.5 mg, Cycle 4, Reason: Dose not tolerated) Administration: 310 mg (10/21/2018), 300 mg (11/11/2018), 300 mg (12/02/2018), 300 mg (12/23/2018), 300 mg (01/13/2019), 300 mg (02/03/2019), 300 mg (02/24/2019) PACLitaxel (TAXOL) 222 mg in sodium chloride 0.9 % 250 mL chemo infusion (> 59m/m2), 135 mg/m2 = 222 mg, Intravenous,  Once, 7 of 7 cycles Administration: 222 mg (10/21/2018), 222 mg (11/11/2018), 222 mg (12/02/2018), 222 mg (12/23/2018), 222 mg (01/13/2019), 222 mg (02/03/2019), 222 mg (02/24/2019) fosaprepitant (EMEND) 150 mg, dexamethasone (DECADRON) 12 mg in sodium chloride 0.9 % 145 mL IVPB, , Intravenous,  Once, 7 of 7 cycles Administration:  (10/21/2018),  (11/11/2018),  (12/02/2018),  (12/23/2018),  (01/13/2019),  (02/03/2019),  (02/24/2019)  for chemotherapy treatment.      REVIEW OF SYSTEMS:   Constitutional: Denies fevers, chills or abnormal weight loss Eyes: Denies blurriness of vision Ears, nose, mouth, throat, and face: Denies mucositis or sore throat Respiratory: Denies cough, dyspnea or wheezes Cardiovascular: Denies palpitation, chest discomfort or lower extremity swelling Gastrointestinal:  Denies nausea, heartburn or change in bowel habits Skin: Denies abnormal skin rashes Lymphatics: Denies new lymphadenopathy or easy bruising Neurological:Denies numbness, tingling or new weaknesses Behavioral/Psych: Mood is stable, no new changes  All other systems were reviewed with the patient and are negative.  I have reviewed the past medical history, past surgical history, social history and family history with the patient and they are unchanged from previous note.  ALLERGIES:  has No Known Allergies.  MEDICATIONS:  Current Outpatient Medications  Medication Sig Dispense Refill  . acetaminophen (TYLENOL) 500 MG tablet Take 1,000 mg by mouth every 6 (six) hours as needed.    Marland Kitchen aspirin EC 81 MG tablet Take 81 mg by mouth daily.    Marland Kitchen  atorvastatin (LIPITOR) 20 MG tablet Take 20 mg by mouth every evening.     . Calcium Carbonate-Vitamin D (CALCIUM-VITAMIN D) 500-200 MG-UNIT per tablet Take 1 tablet by mouth daily.    . citalopram (CELEXA) 20 MG tablet Take 20 mg by mouth at bedtime.     . gabapentin (NEURONTIN) 600 MG tablet Take 600 mg by mouth at bedtime as needed.    Marland Kitchen levothyroxine (SYNTHROID, LEVOTHROID) 100 MCG tablet Take 100 mcg by mouth daily before breakfast.     . lidocaine-prilocaine (EMLA) cream Apply to affected area once 30 g 3  . losartan-hydrochlorothiazide (HYZAAR) 100-25 MG tablet     . magnesium oxide (MAG-OX) 400 (241.3 Mg) MG tablet Take 1 tablet (400 mg total) by mouth daily. 30 tablet 11  . metoprolol succinate (TOPROL-XL) 25 MG 24 hr tablet Take 25 mg by mouth daily.    . Multiple Vitamin (MULTIVITAMIN WITH MINERALS) TABS Take 1 tablet by mouth daily.    . ondansetron (ZOFRAN) 8 MG tablet Take 1 tablet (8 mg total) by mouth every 8 (eight) hours as needed. Start on the third day after chemotherapy. 30 tablet 1  . pantoprazole (PROTONIX) 40 MG tablet Take 40 mg by mouth every morning.     . valACYclovir (VALTREX) 1000 MG tablet valacyclovir 1 gram tablet     No current facility-administered medications for this visit.   Facility-Administered Medications Ordered in Other Visits  Medication Dose Route Frequency Provider Last Rate Last Admin  . magnesium sulfate 6 g in sodium chloride 0.9 % 500 mL  6 g Intravenous Once [name], MD 171 mL/hr at 09/07/19 1331 6 g at 09/07/19 1331    PHYSICAL EXAMINATION: ECOG PERFORMANCE STATUS: 0 - Asymptomatic GENERAL:alert, no distress and comfortable SKIN: skin color, texture, turgor are normal, no rashes or significant lesions EYES: normal, Conjunctiva are pink and non-injected, sclera clear OROPHARYNX:no exudate, no erythema and lips, buccal mucosa, and tongue normal  NECK: supple, thyroid normal size, non-tender, without nodularity LYMPH:  no palpable  lymphadenopathy in the cervical, axillary or inguinal LUNGS: clear to auscultation and percussion with normal breathing effort HEART: regular rate & rhythm and no murmurs and no lower extremity edema ABDOMEN:abdomen soft, non-tender and normal bowel sounds Musculoskeletal:no cyanosis of digits and no clubbing  NEURO: alert & oriented x 3 with fluent speech, no focal motor/sensory deficits  LABORATORY DATA:  I have reviewed the data as listed    Component Value Date/Time   NA 139 09/07/2019 1210   NA 139 02/13/2017 1512   K 3.5 09/07/2019 1210   K 4.2 02/13/2017 1512   CL 102 09/07/2019 1210   CO2 26 09/07/2019 1210   CO2 28 02/13/2017 1512   GLUCOSE 109 (H) 09/07/2019 1210   GLUCOSE 118 02/13/2017 1512   BUN 29 (H) 09/07/2019 1210   BUN 25.2 02/13/2017 1512   CREATININE 1.40 (H) 09/07/2019 1210   CREATININE 1.2 (H) 02/13/2017 1512   CALCIUM 9.2 09/07/2019 1210   CALCIUM 10.0 02/13/2017 1512   PROT 6.8 09/07/2019 1210   ALBUMIN 4.0 09/07/2019 1210   AST 19 09/07/2019 1210   ALT 13 09/07/2019 1210   ALKPHOS 66 09/07/2019 1210   BILITOT 0.4 09/07/2019 1210   GFRNONAA 36 (L)  09/07/2019 1210   GFRAA 41 (L) 09/07/2019 1210    No results found for: SPEP, UPEP  Lab Results  Component Value Date   WBC 6.5 09/07/2019   NEUTROABS 4.2 09/07/2019   HGB 10.6 (L) 09/07/2019   HCT 30.6 (L) 09/07/2019   MCV 91.9 09/07/2019   PLT 196 09/07/2019      Chemistry      Component Value Date/Time   NA 139 09/07/2019 1210   NA 139 02/13/2017 1512   K 3.5 09/07/2019 1210   K 4.2 02/13/2017 1512   CL 102 09/07/2019 1210   CO2 26 09/07/2019 1210   CO2 28 02/13/2017 1512   BUN 29 (H) 09/07/2019 1210   BUN 25.2 02/13/2017 1512   CREATININE 1.40 (H) 09/07/2019 1210   CREATININE 1.2 (H) 02/13/2017 1512      Component Value Date/Time   CALCIUM 9.2 09/07/2019 1210   CALCIUM 10.0 02/13/2017 1512   ALKPHOS 66 09/07/2019 1210   AST 19 09/07/2019 1210   ALT 13 09/07/2019 1210   BILITOT  0.4 09/07/2019 1210

## 2019-09-09 ENCOUNTER — Telehealth: Payer: Self-pay | Admitting: Hematology and Oncology

## 2019-09-09 ENCOUNTER — Other Ambulatory Visit: Payer: Medicare HMO

## 2019-09-09 ENCOUNTER — Ambulatory Visit: Payer: Medicare HMO

## 2019-09-09 ENCOUNTER — Ambulatory Visit: Payer: Medicare HMO | Admitting: Hematology and Oncology

## 2019-09-09 NOTE — Telephone Encounter (Signed)
Scheduled appt per 2/23 sch message - unable to reach pt.  Left message and mailed letter.

## 2019-09-15 DIAGNOSIS — K219 Gastro-esophageal reflux disease without esophagitis: Secondary | ICD-10-CM | POA: Diagnosis not present

## 2019-09-15 DIAGNOSIS — M810 Age-related osteoporosis without current pathological fracture: Secondary | ICD-10-CM | POA: Diagnosis not present

## 2019-09-15 DIAGNOSIS — Z0000 Encounter for general adult medical examination without abnormal findings: Secondary | ICD-10-CM | POA: Diagnosis not present

## 2019-09-15 DIAGNOSIS — I10 Essential (primary) hypertension: Secondary | ICD-10-CM | POA: Diagnosis not present

## 2019-09-15 DIAGNOSIS — J309 Allergic rhinitis, unspecified: Secondary | ICD-10-CM | POA: Diagnosis not present

## 2019-09-15 DIAGNOSIS — E785 Hyperlipidemia, unspecified: Secondary | ICD-10-CM | POA: Diagnosis not present

## 2019-09-24 ENCOUNTER — Telehealth: Payer: Self-pay

## 2019-09-24 NOTE — Telephone Encounter (Signed)
Called and left a message asking her to call the office back today.

## 2019-09-24 NOTE — Telephone Encounter (Signed)
Called back after she left a message. Told her appts are correct for lab/flush prior to CT. Insurance has approved CT scans for Monday. Ask her to call the office back if needed.

## 2019-09-27 ENCOUNTER — Other Ambulatory Visit: Payer: Self-pay

## 2019-09-27 ENCOUNTER — Inpatient Hospital Stay: Payer: Medicare HMO

## 2019-09-27 ENCOUNTER — Inpatient Hospital Stay: Payer: Medicare HMO | Attending: Gynecologic Oncology

## 2019-09-27 ENCOUNTER — Ambulatory Visit (HOSPITAL_COMMUNITY): Payer: Medicare HMO

## 2019-09-27 ENCOUNTER — Ambulatory Visit (HOSPITAL_COMMUNITY)
Admission: RE | Admit: 2019-09-27 | Discharge: 2019-09-27 | Disposition: A | Discharge status: Home / Self Care | Payer: Medicare HMO | Source: Ambulatory Visit | Attending: Hematology and Oncology | Admitting: Hematology and Oncology

## 2019-09-27 DIAGNOSIS — C541 Malignant neoplasm of endometrium: Secondary | ICD-10-CM | POA: Diagnosis present

## 2019-09-27 DIAGNOSIS — Z79899 Other long term (current) drug therapy: Secondary | ICD-10-CM | POA: Diagnosis not present

## 2019-09-27 DIAGNOSIS — Z5112 Encounter for antineoplastic immunotherapy: Secondary | ICD-10-CM | POA: Insufficient documentation

## 2019-09-27 DIAGNOSIS — Z9221 Personal history of antineoplastic chemotherapy: Secondary | ICD-10-CM | POA: Insufficient documentation

## 2019-09-27 DIAGNOSIS — Z7189 Other specified counseling: Secondary | ICD-10-CM

## 2019-09-27 DIAGNOSIS — C539 Malignant neoplasm of cervix uteri, unspecified: Secondary | ICD-10-CM | POA: Diagnosis not present

## 2019-09-27 DIAGNOSIS — Z7982 Long term (current) use of aspirin: Secondary | ICD-10-CM | POA: Insufficient documentation

## 2019-09-27 DIAGNOSIS — C773 Secondary and unspecified malignant neoplasm of axilla and upper limb lymph nodes: Secondary | ICD-10-CM | POA: Insufficient documentation

## 2019-09-27 DIAGNOSIS — Z9079 Acquired absence of other genital organ(s): Secondary | ICD-10-CM | POA: Diagnosis not present

## 2019-09-27 DIAGNOSIS — Z90710 Acquired absence of both cervix and uterus: Secondary | ICD-10-CM | POA: Diagnosis not present

## 2019-09-27 DIAGNOSIS — N183 Chronic kidney disease, stage 3 unspecified: Secondary | ICD-10-CM | POA: Diagnosis not present

## 2019-09-27 DIAGNOSIS — R918 Other nonspecific abnormal finding of lung field: Secondary | ICD-10-CM | POA: Diagnosis not present

## 2019-09-27 DIAGNOSIS — Z90722 Acquired absence of ovaries, bilateral: Secondary | ICD-10-CM | POA: Insufficient documentation

## 2019-09-27 DIAGNOSIS — D631 Anemia in chronic kidney disease: Secondary | ICD-10-CM | POA: Insufficient documentation

## 2019-09-27 DIAGNOSIS — C641 Malignant neoplasm of right kidney, except renal pelvis: Secondary | ICD-10-CM | POA: Insufficient documentation

## 2019-09-27 LAB — CBC WITH DIFFERENTIAL (CANCER CENTER ONLY)
Abs Immature Granulocytes: 0.03 10*3/uL (ref 0.00–0.07)
Basophils Absolute: 0.1 10*3/uL (ref 0.0–0.1)
Basophils Relative: 1 %
Eosinophils Absolute: 0.2 10*3/uL (ref 0.0–0.5)
Eosinophils Relative: 4 %
HCT: 32.4 % — ABNORMAL LOW (ref 36.0–46.0)
Hemoglobin: 11.6 g/dL — ABNORMAL LOW (ref 12.0–15.0)
Immature Granulocytes: 1 %
Lymphocytes Relative: 29 %
Lymphs Abs: 1.6 10*3/uL (ref 0.7–4.0)
MCH: 32.5 pg (ref 26.0–34.0)
MCHC: 35.8 g/dL (ref 30.0–36.0)
MCV: 90.8 fL (ref 80.0–100.0)
Monocytes Absolute: 0.6 10*3/uL (ref 0.1–1.0)
Monocytes Relative: 10 %
Neutro Abs: 3.1 10*3/uL (ref 1.7–7.7)
Neutrophils Relative %: 55 %
Platelet Count: 204 10*3/uL (ref 150–400)
RBC: 3.57 MIL/uL — ABNORMAL LOW (ref 3.87–5.11)
RDW: 11.9 % (ref 11.5–15.5)
WBC Count: 5.6 10*3/uL (ref 4.0–10.5)
nRBC: 0 % (ref 0.0–0.2)

## 2019-09-27 LAB — CMP (CANCER CENTER ONLY)
ALT: 14 U/L (ref 0–44)
AST: 21 U/L (ref 15–41)
Albumin: 4.2 g/dL (ref 3.5–5.0)
Alkaline Phosphatase: 70 U/L (ref 38–126)
Anion gap: 15 (ref 5–15)
BUN: 28 mg/dL — ABNORMAL HIGH (ref 8–23)
CO2: 25 mmol/L (ref 22–32)
Calcium: 9.3 mg/dL (ref 8.9–10.3)
Chloride: 96 mmol/L — ABNORMAL LOW (ref 98–111)
Creatinine: 1.27 mg/dL — ABNORMAL HIGH (ref 0.44–1.00)
GFR, Est AFR Am: 46 mL/min — ABNORMAL LOW (ref >60)
GFR, Estimated: 40 mL/min — ABNORMAL LOW (ref >60)
Glucose, Bld: 96 mg/dL (ref 70–99)
Potassium: 3.4 mmol/L — ABNORMAL LOW (ref 3.5–5.1)
Sodium: 136 mmol/L (ref 135–145)
Total Bilirubin: 0.4 mg/dL (ref 0.3–1.2)
Total Protein: 7.3 g/dL (ref 6.5–8.1)

## 2019-09-27 LAB — MAGNESIUM: Magnesium: 0.8 mg/dL — CL (ref 1.7–2.4)

## 2019-09-27 MED ORDER — IOHEXOL 300 MG/ML  SOLN
75.0000 mL | Freq: Once | INTRAMUSCULAR | Status: AC | PRN
  Administered 2019-09-27: 75 mL via INTRAVENOUS

## 2019-09-27 MED ORDER — SODIUM CHLORIDE (PF) 0.9 % IJ SOLN
INTRAMUSCULAR | Status: AC
  Filled 2019-09-27: qty 50

## 2019-09-27 MED ORDER — HEPARIN SOD (PORK) LOCK FLUSH 100 UNIT/ML IV SOLN
INTRAVENOUS | Status: AC
  Filled 2019-09-27: qty 5

## 2019-09-27 MED ORDER — HEPARIN SOD (PORK) LOCK FLUSH 100 UNIT/ML IV SOLN: 500.0000 [IU] | Freq: Once | INTRAVENOUS | Status: DC

## 2019-09-27 NOTE — Patient Instructions (Signed)

## 2019-09-28 ENCOUNTER — Ambulatory Visit: Payer: Medicare HMO | Admitting: Hematology and Oncology

## 2019-09-28 ENCOUNTER — Other Ambulatory Visit: Payer: Self-pay

## 2019-09-28 ENCOUNTER — Encounter: Payer: Self-pay | Admitting: Hematology and Oncology

## 2019-09-28 ENCOUNTER — Inpatient Hospital Stay: Payer: Medicare HMO

## 2019-09-28 ENCOUNTER — Telehealth: Payer: Self-pay

## 2019-09-28 ENCOUNTER — Inpatient Hospital Stay: Payer: Medicare HMO | Admitting: Hematology and Oncology

## 2019-09-28 VITALS — BP 138/69 | HR 69 | Temp 98.2°F | Resp 18 | Ht 62.2 in | Wt 131.2 lb

## 2019-09-28 DIAGNOSIS — Z5111 Encounter for antineoplastic chemotherapy: Secondary | ICD-10-CM

## 2019-09-28 DIAGNOSIS — C541 Malignant neoplasm of endometrium: Secondary | ICD-10-CM

## 2019-09-28 DIAGNOSIS — Z5112 Encounter for antineoplastic immunotherapy: Secondary | ICD-10-CM | POA: Diagnosis not present

## 2019-09-28 DIAGNOSIS — D630 Anemia in neoplastic disease: Secondary | ICD-10-CM | POA: Diagnosis not present

## 2019-09-28 DIAGNOSIS — N183 Chronic kidney disease, stage 3 unspecified: Secondary | ICD-10-CM | POA: Diagnosis not present

## 2019-09-28 MED ORDER — DIPHENHYDRAMINE HCL 25 MG PO CAPS
25.0000 mg | ORAL_CAPSULE | Freq: Once | ORAL | Status: AC
  Administered 2019-09-28: 25 mg via ORAL

## 2019-09-28 MED ORDER — SODIUM CHLORIDE 0.9 % IV SOLN
6.0000 g | Freq: Once | INTRAVENOUS | Status: AC
  Administered 2019-09-28: 6 g via INTRAVENOUS
  Filled 2019-09-28: qty 12

## 2019-09-28 MED ORDER — TRASTUZUMAB-DKST CHEMO 150 MG IV SOLR
6.0000 mg/kg | Freq: Once | INTRAVENOUS | Status: AC
  Administered 2019-09-28: 357 mg via INTRAVENOUS
  Filled 2019-09-28: qty 17

## 2019-09-28 MED ORDER — ACETAMINOPHEN 325 MG PO TABS
650.0000 mg | ORAL_TABLET | Freq: Once | ORAL | Status: AC
Start: 2019-09-28 — End: 2019-09-28
  Administered 2019-09-28: 650 mg via ORAL

## 2019-09-28 MED ORDER — SODIUM CHLORIDE 0.9% FLUSH
10.0000 mL | INTRAVENOUS | Status: DC | PRN
  Administered 2019-09-28: 10 mL
  Filled 2019-09-28: qty 10

## 2019-09-28 MED ORDER — HEPARIN SOD (PORK) LOCK FLUSH 100 UNIT/ML IV SOLN
500.0000 [IU] | Freq: Once | INTRAVENOUS | Status: AC | PRN
  Administered 2019-09-28: 500 [IU]
  Filled 2019-09-28: qty 5

## 2019-09-28 MED ORDER — DIPHENHYDRAMINE HCL 25 MG PO CAPS
ORAL_CAPSULE | ORAL | Status: AC
  Filled 2019-09-28: qty 1

## 2019-09-28 MED ORDER — ACETAMINOPHEN 325 MG PO TABS
ORAL_TABLET | ORAL | Status: AC
  Filled 2019-09-28: qty 2

## 2019-09-28 MED ORDER — SODIUM CHLORIDE 0.9 % IV SOLN
Freq: Once | INTRAVENOUS | Status: AC
Start: 2019-09-28 — End: 2019-09-28
  Filled 2019-09-28: qty 250

## 2019-09-28 NOTE — Assessment & Plan Note (Signed)
The cause of the low magnesium is unknown [name] is not known to cause low magnesium We discussed nephrology referral and she agreed She will receive IV magnesium replacement today

## 2019-09-28 NOTE — Assessment & Plan Note (Signed)
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 

## 2019-09-28 NOTE — Assessment & Plan Note (Signed)
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

## 2019-09-28 NOTE — Patient Instructions (Signed)
[facility] Discharge Instructions for Patients Receiving Chemotherapy  Today you received the following chemotherapy agents: trastuzumab (Ogivri).  To help prevent nausea and vomiting after your treatment, we encourage you to take your nausea medication as directed.   If you develop nausea and vomiting that is not controlled by your nausea medication, call the clinic.   BELOW ARE SYMPTOMS THAT SHOULD BE REPORTED IMMEDIATELY:  *FEVER GREATER THAN 100.5 F  *CHILLS WITH OR WITHOUT FEVER  NAUSEA AND VOMITING THAT IS NOT CONTROLLED WITH YOUR NAUSEA MEDICATION  *UNUSUAL SHORTNESS OF BREATH  *UNUSUAL BRUISING OR BLEEDING  TENDERNESS IN MOUTH AND THROAT WITH OR WITHOUT PRESENCE OF ULCERS  *URINARY PROBLEMS  *BOWEL PROBLEMS  UNUSUAL RASH Items with * indicate a potential emergency and should be followed up as soon as possible.  Feel free to call the clinic should you have any questions or concerns. The clinic phone number is [phone].  Please show the [facility] at check-in to the Emergency Department and triage nurse.  Hypomagnesemia Hypomagnesemia is a condition in which the level of magnesium in the blood is low. Magnesium is a mineral that is found in many foods. It is used in many different processes in the body. Hypomagnesemia can affect every organ in the body. In severe cases, it can cause life-threatening problems. What are the causes? This condition may be caused by:  Not getting enough magnesium in your diet.  Malnutrition.  Problems with absorbing magnesium from the intestines.  Dehydration.  Alcohol abuse.  Vomiting.  Severe or chronic diarrhea.  Some medicines, including medicines that make you urinate more (diuretics).  Certain diseases, such as kidney disease, diabetes, celiac disease, and overactive thyroid. What are the signs or symptoms? Symptoms of this condition include:  Loss of appetite.  Nausea and vomiting.   Involuntary shaking or trembling of a body part (tremor).  Muscle weakness.  Tingling in the arms and legs.  Sudden tightening of muscles (muscle spasms).  Confusion.  Psychiatric issues, such as depression, irritability, or psychosis.  A feeling of fluttering of the heart.  Seizures. These symptoms are more severe if magnesium levels drop suddenly. How is this diagnosed? This condition may be diagnosed based on:  Your symptoms and medical history.  A physical exam.  Blood and urine tests. How is this treated? Treatment depends on the cause and the severity of the condition. It may be treated with:  A magnesium supplement. This can be taken in pill form. If the condition is severe, magnesium is usually given through an IV.  Changes to your diet. You may be directed to eat foods that have a lot of magnesium, such as green leafy vegetables, peas, beans, and nuts.  Stopping any intake of alcohol. Follow these instructions at home:      Make sure that your diet includes foods with magnesium. Foods that have a lot of magnesium in them include: ? Green leafy vegetables, such as spinach and broccoli. ? Beans and peas. ? Nuts and seeds, such as almonds and sunflower seeds. ? Whole grains, such as whole grain bread and fortified cereals.  Take magnesium supplements if your health care provider tells you to do that. Take them as directed.  Take over-the-counter and prescription medicines only as told by your health care provider.  Have your magnesium levels monitored as told by your health care provider.  When you are active, drink fluids that contain electrolytes.  Avoid drinking alcohol.  Keep all follow-up  visits as told by your health care provider. This is important. Contact a health care provider if:  You get worse instead of better.  Your symptoms return. Get help right away if you:  Develop severe muscle weakness.  Have trouble breathing.  Feel that your  heart is racing. Summary  Hypomagnesemia is a condition in which the level of magnesium in the blood is low.  Hypomagnesemia can affect every organ in the body.  Treatment may include eating more foods that contain magnesium, taking magnesium supplements, and not drinking alcohol.  Have your magnesium levels monitored as told by your health care provider. This information is not intended to replace advice given to you by your health care provider. Make sure you discuss any questions you have with your health care provider. Document Revised: 06/13/2017 Document Reviewed: 06/02/2017 Elsevier Patient Education  2020 [employer].

## 2019-09-28 NOTE — Assessment & Plan Note (Signed)
Renal function is stable. Monitor closely We discussed nephrology consult and she agreed to proceed

## 2019-09-28 NOTE — Progress Notes (Signed)
V[facility] Cerv[facility] OFFICE PROGRESS NOTE  Patient Care Team: Burnard Bunting, [name]  ASSESSMENT & PLAN:  Endometrial cancer [facility]) I have reviewed multiple CT imaging studies with the patient and her daughter She has 1 new lung nodule seen We will follow-up with repeat CT scan in 6 months, around September of this year In the meantime, we will continue [name] treatment indefinitely She is due for echocardiogram and I will try to schedule her echo this week before she leaves town for [place]  Anemia in neoplastic disease She has multifactorial anemia, anemia secondary to prior chemotherapy as well as chronic kidney disease Anemia is improving/stable Recent vitamin B12 level was adequate She is not symptomatic Observe only  CKD (chronic kidney disease), stage III ([place]) Renal function is stable. Monitor closely We discussed nephrology consult and she agreed to proceed  Hypomagnesemia The cause of the low magnesium is unknown [name] is not known to cause low magnesium We discussed nephrology referral and she agreed She will receive IV magnesium replacement today   Orders Placed This Encounter  Procedures  . Ambulatory referral to Nephrology    Referral Priority:   Routine    Referral Type:   Consultation    Referral Reason:   Specialty Services Required    Referred to Provider:   Rosita Fire, MD    Requested Specialty:   Nephrology    Number of Visits Requested:   1  . ECHOCARDIOGRAM COMPLETE    Standing Status:   Future    Standing Expiration Date:   12/28/2020    Order Specific Question:   Where should this test be performed    Answer:   [facility]    Order Specific Question:   Perflutren DEFINITY (image enhancing agent) should be administered unless hypersensitivity or allergy exist    Answer:   Administer Perflutren    Order Specific Question:   Reason for exam-Echo    Answer:   Chemotherapy evaluation  v87.41 /  v58.11    All questions were answered. The patient knows to call the clinic with any problems, questions or concerns. The total time spent in the appointment was 30 minutes encounter with patients including review of chart and various tests results, discussions about plan of care and coordination of care plan   [name], MD 09/28/2019 4:34 PM  INTERVAL HISTORY: Please see below for problem oriented charting. She returns for treatment and follow-up on imaging studies Her daughter, Juliann Pulse is also available over the phone She is doing well No recent side effects from [name] such as shortness of breath, fluid retention or signs or symptoms of congestive heart failure Her appetite is good No new concerns  SUMMARY OF ONCOLOGIC HISTORY: Oncology History Overview Note  Vicki Cervantes  has a remote history of left  breast cancer at age 61 but received BRCA testing approximately 5 years ago which was negative. Her cancer was treated with surgery but no radiation or chemotherapy.   Serous endometrial cancer, MSI stable ER positive, PR neg, Her2/neu 3+      Endometrial cancer ([place])  08/29/2016 Pathology Results   Endometrium, biopsy - HIGH GRADE ENDOMETRIAL CARCINOMA, SEE COMMENT. Microscopic Comment The sections show multiple fragments of adenocarcinoma displaying glandular and papillary patterns associated with high grade cytomorphology characterized by nuclear pleomorphism, prominent nucleoli and brisk mitosis. Immunohistochemical stains show that the tumor cells are positive for vimentin, p16, p53 with increased Ki-67 expression. Estrogen and progesterone  receptor stains show patchy weak positivity. No significant positivity is seen with CEA. The findings are consistent with high grade endometrial carcinoma and the overall morphology and phenotypic features favor serous carcinoma.   08/29/2016 Initial Diagnosis   She presented with postmenopausal bleeding   10/03/2016 Imaging   CT C/A/P  09/2016 IMPRESSION: 1. Thickening of the endometrial canal up to 19 mm in fundus, presumably corresponding to the patient's reported endometrial carcinoma. 2. Multiple tiny pulmonary nodules scattered throughout the lungs bilaterally measuring 4 mm or less in size. Nodules of this size are typically considered statistically likely benign. In the setting of known primary malignancy, metastatic disease to the lungs is not excluded, but is not strongly favored on today's examination. Attention on followup studies is recommended to ensure the stability or resolution of these nodules. 3. Subcentimeter low-attenuation lesion in the central aspect of segment 8 of the liver is too small to characterize. This is statistically likely a tiny cyst, but warrants attention on follow-up studies to exclude the possibility of a solitary hepatic metastasis. 4. 1.5 x 1.5 x 1.7 cm well-circumscribed lesion in the proximal stomach. This is of uncertain etiology and significance, and could represent a benign gastric polyp, however, further evaluation with nonemergent endoscopy is suggested in the near future for further evaluation. 5. **An incidental finding of potential clinical significance has been found. 1.1 x 1.6 cm thyroid nodule in the inferior aspect of the right lobe of the thyroid gland. Follow-up evaluation with nonemergent thyroid ultrasound is recommended in the near future to better evaluate this finding. This recommendation follows ACR consensuss guidelines: Managing Incidental Thyroid Nodules Detected on Imaging: White Paper of the ACR Incidental Thyroid Findings Committee. J Am Coll Radiol 2015;12(2):143-150.** 6. Aortic atherosclerosis, in addition to left anterior descending coronary artery disease   10/22/2016 Surgery   Robotic assisted total hysterectomy, BSO and bilateral pelvic lymphadenectomy  Final pathology revealed a 3cm polyp containing serous carcinoma but with no myometrial invasion, no LVSI and  negative nodes.  Stage IA Uterine serous cancer   10/22/2016 Pathology Results   1. Lymph nodes, regional resection, right pelvic - SIX BENIGN LYMPH NODES (0/6). 2. Lymph nodes, regional resection, left pelvic - SEVEN BENIGN LYMPH NODES (0/7). 3. Uterus +/- tubes/ovaries, neoplastic, with right ovary and fallopian tube ENDOMYOMETRIUM - SEROUS CARCINOMA ARISING WITHIN AN ENDOMETRIAL POLYP - NO MYOMETRIAL INVASION IDENTIFIED - ADENOMYOSIS - LEIOMYOMA (1 CM) - SEE ONCOLOGY TABLE AND COMMENT CERVIX - CARCINOMA FOCALLY INVOLVES ENDOCERVICAL GLANDS - NABOTHIAN CYSTS RIGHT ADNEXA - BENIGN OVARY AND FALLOPIAN TUBE - NO CARCINOMA IDENTIFIED 4. Cul-de-sac biopsy - MESOTHELIAL HYPERPLASIA Microscopic Comment 3. ONCOLOGY TABLE-UTERUS, CARCINOMA OR CARCINOSARCOMA Specimen: Uterus, right fallopian tube and ovary Procedure: Total hysterectomy and right salpingo-oophorectomy Lymph node sampling performed: Bilateral pelvic regional resection Specimen integrity: Intact Maximum tumor size: 3 cm (polyp) Histologic type: Serous carcinoma Grade: High grade Myometrial invasion: Not identified Cervical stromal involvement: No, focal endocervical gland involvement Extent of involvement of other organs: Not identified Lymph - vascular invasion: Not identified Peritoneal washings: N/A Lymph nodes: Examined: 0 Sentinel 13 Non-sentinel 13 Total Lymph nodes with metastasis: 0 Isolated tumor cells (< 0.2 mm): 0 Micrometastasis: (> 0.2 mm and < 2.0 mm): 0 Macrometastasis: (> 2.0 mm): 0 Extracapsular extension: N/A Pelvic lymph nodes: 0 involved of 13 lymph nodes. Para-aortic lymph nodes: No para-aortic nodes submitted TNM code: pT1a, pNX FIGO Stage (based on pathologic findings, needs clinical correlation): IA Comment: Immunohistochemistry for cytokeratin AE1/AE3 is performed on all of  the lymph nodes (parts 1 & 2) and no metastatic carcinoma is identified.   07/04/2017 Genetic Testing   The  patient had genetic testing due to a personal history of breast and uterine cancer, and a family history of stomach cancer.  The Multi-Cancer Panel was ordered. The Multi-Cancer Panel offered by [facility] includes sequencing and/or deletion duplication testing of the following 83 genes: ALK, APC, ATM, AXIN2,BAP1,  BARD1, BLM, BMPR1A, BRCA1, BRCA2, BRIP1, CASR, CDC73, CDH1, CDK4, CDKN1B, CDKN1C, CDKN2A (p14ARF), CDKN2A (p16INK4a), CEBPA, CHEK2, CTNNA1, DICER1, DIS3L2, EGFR (c.2369C>T, p.Thr790Met variant only), EPCAM (Deletion/duplication testing only), FH, FLCN, GATA2, GPC3, GREM1 (Promoter region deletion/duplication testing only), HOXB13 (c.251G>A, p.Gly84Glu), HRAS, KIT, MAX, MEN1, MET, MITF (c.952G>A, p.Glu318Lys variant only), MLH1, MSH2, MSH3, MSH6, MUTYH, NBN, NF1, NF2, NTHL1, PALB2, PDGFRA, PHOX2B, PMS2, POLD1, POLE, POT1, PRKAR1A, PTCH1, PTEN, RAD50, RAD51C, RAD51D, RB1, RECQL4, RET, RUNX1, SDHAF2, SDHA (sequence changes only), SDHB, SDHC, SDHD, SMAD4, SMARCA4, SMARCB1, SMARCE1, STK11, SUFU, TERC, TERT, TMEM127, TP53, TSC1, TSC2, VHL, WRN and WT1.   Results: No pathogenic variants were identified.  A variant of uncertain significance in the gene APC was identified.  c.791A>G (p.Gln264Arg).  The date of this test report is 07/04/2017.     Genetic Testing   Patient has genetic testing done for MSI. 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    12/03/2017 Imaging   CT Chest/Abd/Pelvis to follow pulmonary nodule and gastric mass IMPRESSION: 1. Stable CT of the chest. Small pulmonary nodules are unchanged when compared with previous exam. 2. No new findings identified. 3. Subcentimeter low-attenuation lesions within the liver are remain too small to characterize but are stable from prior exam. 4. Persistent indeterminate low-attenuation structure within the proximal stomach is unchanged measuring 1.4 cm. Correlation with direct visualization is advised    06/30/2018 Imaging   CT CHEST Lungs/Pleura: Stable scattered sub-cm pulmonary nodules are again seen bilaterally and are stable compared to previous studies. No new or enlarging pulmonary nodules or masses identified. No evidence of pulmonary infiltrate or pleural effusion.   08/19/2018 Echocardiogram   E[place] is done in [place]: EF 60-65%. Mild impaired relaxation. (report scanned)   09/17/2018 Relapse/Recurrence   Presented with c/o vagina discharge.  Lesion noted at the left vaginal apex 51m lesion removed Path c/w high grade serous cancer   09/17/2018 Pathology Results   Vagina, biopsy, left apex - HIGH GRADE SEROUS CARCINOMA.   09/28/2018 PET scan   1. Two hypermetabolic axial lymph nodes. Unusual site for metastatic endometrial carcinoma however the activity is more intense than typically seen in reactive adenopathy. Suggest ultrasound-guided percutaneous biopsy of the larger RIGHT axial lymph node 2. No evidence of local recurrence at the vaginal cuff.  3. No metastatic adenopathy in the abdomen or pelvis. 4. Stable small pulmonary nodules   10/09/2018 Pathology Results   Lymph node, needle/core biopsy, right axilla - METASTATIC CARCINOMA, SEE COMMENT. Microscopic Comment The carcinoma appears high grade. Immunohistochemistry is positive for cytokeratin 7, PAX-8, and ER. Cytokeratin 20, CDX-2, PR, and GATA-3 are negative. The findings along with the history are consistent with a gynecologic primary.    10/09/2018 Procedure   Ultrasound-guided core biopsies of a right axillary lymph node.   10/14/2018 Cancer Staging   Staging form: Corpus Uteri - Carcinoma and Carcinosarcoma, AJCC 8th Edition - Clinical: Stage IVB (cT1, cN0, pM1) - Signed by GHeath Lark MD on 10/14/2018   10/19/2018 Imaging   Placement of single lumen port a cath via right internal  jugular vein. The catheter tip lies at the cavo-atrial junction. A power injectable port a cath was placed and is ready for immediate use.     10/21/2018 -  Chemotherapy   The patient had carboplatin and taxol for treatment   11/11/2018 -  Chemotherapy   The patient had trastuzumab ([name]) 450 mg in sodium chloride 0.9 % 250 mL chemo infusion, 483 mg, Intravenous,  Once, 6 of 6 cycles Administration: 450 mg (11/11/2018), 378 mg (12/02/2018), 378 mg (02/03/2019), 378 mg (12/23/2018), 378 mg (01/13/2019), 378 mg (02/24/2019) trastuzumab-dkst (OGIVRI) 378 mg in sodium chloride 0.9 % 250 mL chemo infusion, 6 mg/kg = 378 mg (100 % of original dose 6 mg/kg), Intravenous,  Once, 10 of 11 cycles Dose modification: 6 mg/kg (original dose 6 mg/kg, Cycle 7, Reason: Other (see comments)) Administration: 378 mg (03/24/2019), 378 mg (04/14/2019), 378 mg (05/05/2019), 378 mg (05/26/2019), 378 mg (06/16/2019), 378 mg (07/06/2019), 357 mg (07/28/2019), 357 mg (08/18/2019), 357 mg (09/07/2019), 357 mg (09/28/2019)  for chemotherapy treatment.    12/21/2018 PET scan   1. Interval resolution of hypermetabolic right axillary lymph nodes. No metabolic findings highly suspicious for recurrent metastatic disease. 2. New mild hypermetabolism within a borderline prominent portacaval lymph node, nonspecific. While a reactive node is favored, a lymph node metastasis cannot be entirely excluded. Suggest attention to this lymph node on follow-up PET-CT in 3-6 months. 3. Nonspecific new hypermetabolism at the ileocecal valve, more likely physiologic given absence of CT correlate. 4. Scattered subcentimeter pulmonary nodules are all stable and below PET resolution, more likely benign, continued CT surveillance advised. 5. Chronic findings include: Aortic Atherosclerosis (ICD10-I70.0). Marked diffuse colonic diverticulosis. Coronary atherosclerosis.    12/28/2018 Echocardiogram   1. The left ventricle has normal systolic function with an ejection fraction of 60-65%. The cavity size was normal. Left ventricular diastolic Doppler parameters are consistent with impaired relaxation.   2. GLS recorded as -10.7 but LV appears hyperdynamic and tracking of endocardium appears poor.  3. The right ventricle has normal systolic function. The cavity was normal. There is no increase in right ventricular wall thickness.  4. Mild thickening of the mitral valve leaflet.  5. The aortic valve was not well visualized. Mild thickening of the aortic valve. Aortic valve regurgitation is trivial by color flow Doppler.   03/23/2019 Imaging   PET 1. No findings of hypermetabolic residual/recurrent or metastatic disease. 2. Similar low-level hypermetabolism within normal sized portocaval and right inguinal nodes, favored to be reactive. 3. Ongoing stability of small bilateral pulmonary nodules, favored to be benign. Below PET resolution. 4. Coronary artery atherosclerosis. Aortic Atherosclerosis   03/26/2019 Echocardiogram   1. The left ventricle has normal systolic function, with an ejection fraction of 55-60%. The cavity size was normal. Left ventricular diastolic Doppler parameters are consistent with impaired relaxation.  2. The right ventricle has normal systolic function. The cavity was normal.  3. The mitral valve is abnormal. Mild thickening of the mitral valve leaflet. There is mild mitral annular calcification present.  4. The tricuspid valve is grossly normal.  5. The aortic valve is tricuspid. Mild calcification of the aortic valve. No stenosis of the aortic valve.  6. The aorta is normal unless otherwise noted.  7. Normal LV systolic function; grade 1 diastolic dysfunction; FBP-10.2%.   06/30/2019 Imaging   Chest Impression:   1. No evidence thoracic metastasis. 2. Stable small benign-appearing pulmonary nodules   Abdomen / Pelvis Impression:   1. No evidence of metastatic disease in  the abdomen pelvis. 2.  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 3.  Aortic Atherosclerosis (ICD10-I70.0).   06/30/2019 Echocardiogram    1. Left ventricular ejection fraction,  by visual estimation, is 60 to 65%. The left ventricle has normal function. There is no left ventricular hypertrophy.  2. Left ventricular diastolic parameters are consistent with Grade I diastolic dysfunction (impaired relaxation).  3. The left ventricle has no regional wall motion abnormalities.  4. Global right ventricle has normal systolic function.The right ventricular size is normal. No increase in right ventricular wall thickness.  5. Left atrial size was mild-moderately dilated.  6. Right atrial size was normal.  7. The mitral valve is normal in structure. Trivial mitral valve regurgitation.  8. The tricuspid valve is normal in structure. Tricuspid valve regurgitation is trivial.  9. The aortic valve is normal in structure. Aortic valve regurgitation is trivial. No evidence of aortic valve sclerosis or stenosis. 10. The pulmonic valve was grossly normal. Pulmonic valve regurgitation is not visualized. 11. Mildly elevated pulmonary artery systolic pressure. 12. The inferior vena cava is normal in size with greater than 50% respiratory variability, suggesting right atrial pressure of 3 mmHg. 13. The average left ventricular global longitudinal strain is -12.5 %. GLS underestimated due to poor endocardial tracking.     09/27/2019 Imaging   1. Multiple small, nonspecific pulmonary nodules are again noted. The previously noted lung nodules are unchanged in size from previous exam. There is a new lung nodule within the medial right lower lobe which is nonspecific measure 4 mm. Attention at follow-up imaging is recommended. 2. No evidence of metastatic disease within the abdomen or pelvis. 3. Coronary artery calcifications.  The 4.  Aortic Atherosclerosis (ICD10-I70.0).   Metastasis to lymph nodes ([place])  10/14/2018 Initial Diagnosis   Metastasis to lymph nodes ([place])   10/21/2018 - 03/16/2019 Chemotherapy   The patient had palonosetron (ALOXI) injection 0.25 mg, 0.25 mg, Intravenous,  Once, 7 of 7  cycles Administration: 0.25 mg (10/21/2018), 0.25 mg (11/11/2018), 0.25 mg (12/02/2018), 0.25 mg (12/23/2018), 0.25 mg (01/13/2019), 0.25 mg (02/03/2019), 0.25 mg (02/24/2019) CARBOplatin (PARAPLATIN) 310 mg in sodium chloride 0.9 % 250 mL chemo infusion, 310 mg, Intravenous,  Once, 7 of 7 cycles Dose modification: 300 mg (original dose 311.5 mg, Cycle 4, Reason: Dose not tolerated) Administration: 310 mg (10/21/2018), 300 mg (11/11/2018), 300 mg (12/02/2018), 300 mg (12/23/2018), 300 mg (01/13/2019), 300 mg (02/03/2019), 300 mg (02/24/2019) PACLitaxel (TAXOL) 222 mg in sodium chloride 0.9 % 250 mL chemo infusion (> 10m/m2), 135 mg/m2 = 222 mg, Intravenous,  Once, 7 of 7 cycles Administration: 222 mg (10/21/2018), 222 mg (11/11/2018), 222 mg (12/02/2018), 222 mg (12/23/2018), 222 mg (01/13/2019), 222 mg (02/03/2019), 222 mg (02/24/2019) fosaprepitant (EMEND) 150 mg, dexamethasone (DECADRON) 12 mg in sodium chloride 0.9 % 145 mL IVPB, , Intravenous,  Once, 7 of 7 cycles Administration:  (10/21/2018),  (11/11/2018),  (12/02/2018),  (12/23/2018),  (01/13/2019),  (02/03/2019),  (02/24/2019)  for chemotherapy treatment.      REVIEW OF SYSTEMS:   Constitutional: Denies fevers, chills or abnormal weight loss Eyes: Denies blurriness of vision Ears, nose, mouth, throat, and face: Denies mucositis or sore throat Respiratory: Denies cough, dyspnea or wheezes Cardiovascular: Denies palpitation, chest discomfort or lower extremity swelling Gastrointestinal:  Denies nausea, heartburn or change in bowel habits Skin: Denies abnormal skin rashes Lymphatics: Denies new lymphadenopathy or easy bruising Neurological:Denies numbness, tingling or new weaknesses Behavioral/Psych: Mood is stable, no new changes  All other systems were  reviewed with the patient and are negative.  I have reviewed the past medical history, past surgical history, social history and family history with the patient and they are unchanged from previous note.  ALLERGIES:   has No Known Allergies.  MEDICATIONS:  Current Outpatient Medications  Medication Sig Dispense Refill  . acetaminophen (TYLENOL) 500 MG tablet Take 1,000 mg by mouth every 6 (six) hours as needed.    Marland Kitchen aspirin EC 81 MG tablet Take 81 mg by mouth daily.    Marland Kitchen atorvastatin (LIPITOR) 20 MG tablet Take 20 mg by mouth every evening.     . Calcium Carbonate-Vitamin D (CALCIUM-VITAMIN D) 500-200 MG-UNIT per tablet Take 1 tablet by mouth daily.    . citalopram (CELEXA) 20 MG tablet Take 20 mg by mouth at bedtime.     . gabapentin (NEURONTIN) 600 MG tablet Take 600 mg by mouth at bedtime as needed.    Marland Kitchen levothyroxine (SYNTHROID, LEVOTHROID) 100 MCG tablet Take 100 mcg by mouth daily before breakfast.     . lidocaine-prilocaine (EMLA) cream Apply to affected area once 30 g 3  . losartan-hydrochlorothiazide (HYZAAR) 100-25 MG tablet     . magnesium oxide (MAG-OX) 400 (241.3 Mg) MG tablet Take 1 tablet (400 mg total) by mouth daily. 30 tablet 11  . metoprolol succinate (TOPROL-XL) 25 MG 24 hr tablet Take 25 mg by mouth daily.    . Multiple Vitamin (MULTIVITAMIN WITH MINERALS) TABS Take 1 tablet by mouth daily.    . ondansetron (ZOFRAN) 8 MG tablet Take 1 tablet (8 mg total) by mouth every 8 (eight) hours as needed. Start on the third day after chemotherapy. 30 tablet 1  . pantoprazole (PROTONIX) 40 MG tablet Take 40 mg by mouth every morning.     . valACYclovir (VALTREX) 1000 MG tablet valacyclovir 1 gram tablet     No current facility-administered medications for this visit.   Facility-Administered Medications Ordered in Other Visits  Medication Dose Route Frequency Provider Last Rate Last Admin  . sodium chloride flush (NS) 0.9 % injection 10 mL  10 mL Intracatheter PRN Alvy Bimler, Treson Laura, MD   10 mL at 09/28/19 1613    PHYSICAL EXAMINATION: ECOG PERFORMANCE STATUS: 1 - Symptomatic but completely ambulatory  Vitals:   09/28/19 1131  BP: 138/69  Pulse: 69  Resp: 18  Temp: 98.2 F (36.8 C)  SpO2:  98%   Filed Weights   09/28/19 1131  Weight: 131 lb 3.2 oz (59.5 kg)    GENERAL:alert, no distress and comfortable Musculoskeletal:no cyanosis of digits and no clubbing  NEURO: alert & oriented x 3 with fluent speech, no focal motor/sensory deficits  LABORATORY DATA:  I have reviewed the data as listed    Component Value Date/Time   NA 136 09/27/2019 1048   NA 139 02/13/2017 1512   K 3.4 (L) 09/27/2019 1048   K 4.2 02/13/2017 1512   CL 96 (L) 09/27/2019 1048   CO2 25 09/27/2019 1048   CO2 28 02/13/2017 1512   GLUCOSE 96 09/27/2019 1048   GLUCOSE 118 02/13/2017 1512   BUN 28 (H) 09/27/2019 1048   BUN 25.2 02/13/2017 1512   CREATININE 1.27 (H) 09/27/2019 1048   CREATININE 1.2 (H) 02/13/2017 1512   CALCIUM 9.3 09/27/2019 1048   CALCIUM 10.0 02/13/2017 1512   PROT 7.3 09/27/2019 1048   ALBUMIN 4.2 09/27/2019 1048   AST 21 09/27/2019 1048   ALT 14 09/27/2019 1048   ALKPHOS 70 09/27/2019 1048   BILITOT 0.4  09/27/2019 1048   GFRNONAA 40 (L) 09/27/2019 1048   GFRAA 46 (L) 09/27/2019 1048    No results found for: SPEP, UPEP  Lab Results  Component Value Date   WBC 5.6 09/27/2019   NEUTROABS 3.1 09/27/2019   HGB 11.6 (L) 09/27/2019   HCT 32.4 (L) 09/27/2019   MCV 90.8 09/27/2019   PLT 204 09/27/2019      Chemistry      Component Value Date/Time   NA 136 09/27/2019 1048   NA 139 02/13/2017 1512   K 3.4 (L) 09/27/2019 1048   K 4.2 02/13/2017 1512   CL 96 (L) 09/27/2019 1048   CO2 25 09/27/2019 1048   CO2 28 02/13/2017 1512   BUN 28 (H) 09/27/2019 1048   BUN 25.2 02/13/2017 1512   CREATININE 1.27 (H) 09/27/2019 1048   CREATININE 1.2 (H) 02/13/2017 1512      Component Value Date/Time   CALCIUM 9.3 09/27/2019 1048   CALCIUM 10.0 02/13/2017 1512   ALKPHOS 70 09/27/2019 1048   AST 21 09/27/2019 1048   ALT 14 09/27/2019 1048   BILITOT 0.4 09/27/2019 1048       RADIOGRAPHIC STUDIES: I have reviewed multiple imaging studies with the patient I have personally  reviewed the radiological images as listed and agreed with the findings in the report. CT CHEST W CONTRAST  Result Date: 09/27/2019 CLINICAL DATA:  Uterine/cervical cancer.  Restaging. EXAM: CT CHEST, ABDOMEN, AND PELVIS WITH CONTRAST TECHNIQUE: Multidetector CT imaging of the chest, abdomen and pelvis was performed following the standard protocol during bolus administration of intravenous contrast. CONTRAST:  30m OMNIPAQUE IOHEXOL 300 MG/ML  SOLN COMPARISON:  06/30/2019 FINDINGS: CT CHEST FINDINGS Cardiovascular: The heart size appears normal. No pericardial effusion identified. Aortic atherosclerosis. Lad coronary artery calcification. Mediastinum/Nodes: Unchanged low-attenuation thyroid nodules measuring up to 9 mm. The trachea appears patent and is midline. Normal appearance of the esophagus. No mediastinal, hilar, axillary, or supraclavicular adenopathy. Lungs/Pleura: No pleural effusion identified. Multiple scattered tiny lung nodules are again noted in both lungs: -Unchanged 3 mm posterior right upper lobe lung nodule, image 50/6. -Anterior right middle lobe lung nodule is unchanged measuring 5 mm, image 83/6. -3 mm left lower lobe lung nodule is stable, image 81/6. -2 mm posterolateral left upper lobe lung nodule is unchanged, image 74/6. -4 mm right lower lobe lung nodule is new from previous exam, image 112/6. Musculoskeletal: No chest wall mass or suspicious bone lesions identified. CT ABDOMEN PELVIS FINDINGS Hepatobiliary: Unchanged 8 mm low-density structure within segment 8, image 48/2. Too small to reliably characterize. No new suspicious liver abnormality. Gallbladder unremarkable. No biliary dilatation. Pancreas: Unremarkable. No pancreatic ductal dilatation or surrounding inflammatory changes. Spleen: Normal in size without focal abnormality. Adrenals/Urinary Tract: Normal appearance of the adrenal glands. No kidney mass or hydronephrosis identified. Urinary bladder is unremarkable.  Stomach/Bowel: Stomach is nondistended. No bowel wall thickening, inflammation or distension. Scattered colonic diverticula noted without acute inflammation. Vascular/Lymphatic: Aortic atherosclerosis. No aneurysm. No abdominopelvic adenopathy identified. No pelvic or inguinal adenopathy. Reproductive: Status post hysterectomy. No adnexal masses. Other: No ascites or focal fluid collections. No peritoneal nodules or mass. Musculoskeletal: No acute or significant osseous findings. 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 Anterolisthesis of L4 on L5, and L5 on S1 noted. Bilateral facet hypertrophy and degenerative change. IMPRESSION: 1. Multiple small, nonspecific pulmonary nodules are again noted. The previously noted lung nodules are unchanged in size from previous exam. There is a new lung nodule  within the medial right lower lobe which is nonspecific measure 4 mm. Attention at follow-up imaging is recommended. 2. No evidence of metastatic disease within the abdomen or pelvis. 3. Coronary artery calcifications.  The 4.  Aortic Atherosclerosis (ICD10-I70.0). Electronically Signed   By: Kerby Moors M.D.   On: 09/27/2019 13:16   CT ABDOMEN PELVIS W CONTRAST  Result Date: 09/27/2019 CLINICAL DATA:  Uterine/cervical cancer.  Restaging. EXAM: CT CHEST, ABDOMEN, AND PELVIS WITH CONTRAST TECHNIQUE: Multidetector CT imaging of the chest, abdomen and pelvis was performed following the standard protocol during bolus administration of intravenous contrast. CONTRAST:  2m OMNIPAQUE IOHEXOL 300 MG/ML  SOLN COMPARISON:  06/30/2019 FINDINGS: CT CHEST FINDINGS Cardiovascular: The heart size appears normal. No pericardial effusion identified. Aortic atherosclerosis. Lad coronary artery calcification. Mediastinum/Nodes: Unchanged low-attenuation thyroid nodules measuring up to 9 mm. The trachea appears patent and is midline. Normal appearance of the esophagus. No mediastinal, hilar,  axillary, or supraclavicular adenopathy. Lungs/Pleura: No pleural effusion identified. Multiple scattered tiny lung nodules are again noted in both lungs: -Unchanged 3 mm posterior right upper lobe lung nodule, image 50/6. -Anterior right middle lobe lung nodule is unchanged measuring 5 mm, image 83/6. -3 mm left lower lobe lung nodule is stable, image 81/6. -2 mm posterolateral left upper lobe lung nodule is unchanged, image 74/6. -4 mm right lower lobe lung nodule is new from previous exam, image 112/6. Musculoskeletal: No chest wall mass or suspicious bone lesions identified. CT ABDOMEN PELVIS FINDINGS Hepatobiliary: Unchanged 8 mm low-density structure within segment 8, image 48/2. Too small to reliably characterize. No new suspicious liver abnormality. Gallbladder unremarkable. No biliary dilatation. Pancreas: Unremarkable. No pancreatic ductal dilatation or surrounding inflammatory changes. Spleen: Normal in size without focal abnormality. Adrenals/Urinary Tract: Normal appearance of the adrenal glands. No kidney mass or hydronephrosis identified. Urinary bladder is unremarkable. Stomach/Bowel: Stomach is nondistended. No bowel wall thickening, inflammation or distension. Scattered colonic diverticula noted without acute inflammation. Vascular/Lymphatic: Aortic atherosclerosis. No aneurysm. No abdominopelvic adenopathy identified. No pelvic or inguinal adenopathy. Reproductive: Status post hysterectomy. No adnexal masses. Other: No ascites or focal fluid collections. No peritoneal nodules or mass. Musculoskeletal: No acute or significant osseous findings. 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 Anterolisthesis of L4 on L5, and L5 on S1 noted. Bilateral facet hypertrophy and degenerative change. IMPRESSION: 1. Multiple small, nonspecific pulmonary nodules are again noted. The previously noted lung nodules are unchanged in size from previous exam. There is a new lung nodule  within the medial right lower lobe which is nonspecific measure 4 mm. Attention at follow-up imaging is recommended. 2. No evidence of metastatic disease within the abdomen or pelvis. 3. Coronary artery calcifications.  The 4.  Aortic Atherosclerosis (ICD10-I70.0). Electronically Signed   By: TKerby MoorsM.D.   On: 09/27/2019 13:16

## 2019-09-28 NOTE — Telephone Encounter (Signed)
Given echo appt for 3/18 at 1100, instructed to arrive at 1045. She verbalized understanding.

## 2019-09-29 ENCOUNTER — Telehealth: Payer: Self-pay | Admitting: Hematology and Oncology

## 2019-09-29 DIAGNOSIS — M79642 Pain in left hand: Secondary | ICD-10-CM | POA: Diagnosis not present

## 2019-09-29 DIAGNOSIS — M25512 Pain in left shoulder: Secondary | ICD-10-CM | POA: Diagnosis not present

## 2019-09-29 DIAGNOSIS — M19012 Primary osteoarthritis, left shoulder: Secondary | ICD-10-CM | POA: Diagnosis not present

## 2019-09-29 DIAGNOSIS — M79672 Pain in left foot: Secondary | ICD-10-CM | POA: Diagnosis not present

## 2019-09-29 DIAGNOSIS — M79671 Pain in right foot: Secondary | ICD-10-CM | POA: Diagnosis not present

## 2019-09-29 NOTE — Telephone Encounter (Signed)
Scheduled per 3/16 sch msg. Called and spoke with pt, confirmed 4/6 appt

## 2019-09-30 ENCOUNTER — Other Ambulatory Visit: Payer: Self-pay

## 2019-09-30 ENCOUNTER — Ambulatory Visit (HOSPITAL_COMMUNITY)
Admission: RE | Admit: 2019-09-30 | Discharge: 2019-09-30 | Disposition: A | Discharge status: Home / Self Care | Payer: Medicare HMO | Source: Ambulatory Visit | Attending: Hematology and Oncology | Admitting: Hematology and Oncology

## 2019-09-30 DIAGNOSIS — Z79899 Other long term (current) drug therapy: Secondary | ICD-10-CM | POA: Insufficient documentation

## 2019-09-30 DIAGNOSIS — I119 Hypertensive heart disease without heart failure: Secondary | ICD-10-CM | POA: Diagnosis not present

## 2019-09-30 DIAGNOSIS — Z5181 Encounter for therapeutic drug level monitoring: Secondary | ICD-10-CM | POA: Insufficient documentation

## 2019-09-30 DIAGNOSIS — Z853 Personal history of malignant neoplasm of breast: Secondary | ICD-10-CM | POA: Diagnosis not present

## 2019-09-30 DIAGNOSIS — K219 Gastro-esophageal reflux disease without esophagitis: Secondary | ICD-10-CM | POA: Diagnosis not present

## 2019-09-30 DIAGNOSIS — Z5111 Encounter for antineoplastic chemotherapy: Secondary | ICD-10-CM | POA: Diagnosis not present

## 2019-09-30 DIAGNOSIS — E785 Hyperlipidemia, unspecified: Secondary | ICD-10-CM | POA: Insufficient documentation

## 2019-09-30 NOTE — Progress Notes (Signed)
  Echocardiogram 2D Echocardiogram has been performed.  Vicki Cervantes M 09/30/2019, 11:38 AM

## 2019-10-01 ENCOUNTER — Telehealth: Payer: Self-pay

## 2019-10-01 NOTE — Telephone Encounter (Signed)
Called Dr. Carolin Sicks office and left referral information on voicemail. Faxed referral information to [phone]. Received confirmation.

## 2019-10-04 ENCOUNTER — Telehealth: Payer: Self-pay

## 2019-10-04 NOTE — Telephone Encounter (Signed)
Called and left below message. Ask her to call the office for questions. ?

## 2019-10-04 NOTE — Telephone Encounter (Signed)
-----   Message from [name], MD sent at 10/04/2019  8:42 AM EDT ----- Regarding: pls let her know [facility] is ok

## 2019-10-13 NOTE — Progress Notes (Signed)
Pharmacist Chemotherapy Monitoring - Follow Up Assessment    I verify that I have reviewed each item in the below checklist:  . Regimen for the patient is scheduled for the appropriate day and plan matches scheduled date. Marland Kitchen Appropriate non-routine labs are ordered dependent on drug ordered. . If applicable, additional medications reviewed and ordered per protocol based on lifetime cumulative doses and/or treatment regimen.   Plan for follow-up and/or issues identified: No . I-vent associated with next due treatment: No . MD and/or nursing notified: No  Britt Boozer 10/13/2019 11:30 AM

## 2019-10-19 ENCOUNTER — Other Ambulatory Visit: Payer: Self-pay

## 2019-10-19 ENCOUNTER — Inpatient Hospital Stay: Payer: Medicare HMO | Attending: Gynecologic Oncology

## 2019-10-19 ENCOUNTER — Inpatient Hospital Stay: Payer: Medicare HMO

## 2019-10-19 ENCOUNTER — Inpatient Hospital Stay: Payer: Medicare HMO | Admitting: Hematology and Oncology

## 2019-10-19 ENCOUNTER — Other Ambulatory Visit: Payer: Self-pay | Admitting: Hematology and Oncology

## 2019-10-19 ENCOUNTER — Telehealth: Payer: Self-pay

## 2019-10-19 ENCOUNTER — Encounter: Payer: Self-pay | Admitting: Hematology and Oncology

## 2019-10-19 DIAGNOSIS — R918 Other nonspecific abnormal finding of lung field: Secondary | ICD-10-CM | POA: Diagnosis not present

## 2019-10-19 DIAGNOSIS — C541 Malignant neoplasm of endometrium: Secondary | ICD-10-CM

## 2019-10-19 DIAGNOSIS — Z7189 Other specified counseling: Secondary | ICD-10-CM

## 2019-10-19 DIAGNOSIS — N183 Chronic kidney disease, stage 3 unspecified: Secondary | ICD-10-CM | POA: Insufficient documentation

## 2019-10-19 DIAGNOSIS — C773 Secondary and unspecified malignant neoplasm of axilla and upper limb lymph nodes: Secondary | ICD-10-CM

## 2019-10-19 DIAGNOSIS — Z5112 Encounter for antineoplastic immunotherapy: Secondary | ICD-10-CM | POA: Diagnosis present

## 2019-10-19 DIAGNOSIS — E042 Nontoxic multinodular goiter: Secondary | ICD-10-CM | POA: Insufficient documentation

## 2019-10-19 DIAGNOSIS — Z90710 Acquired absence of both cervix and uterus: Secondary | ICD-10-CM | POA: Insufficient documentation

## 2019-10-19 DIAGNOSIS — D630 Anemia in neoplastic disease: Secondary | ICD-10-CM | POA: Diagnosis not present

## 2019-10-19 DIAGNOSIS — E785 Hyperlipidemia, unspecified: Secondary | ICD-10-CM | POA: Diagnosis not present

## 2019-10-19 DIAGNOSIS — Z9079 Acquired absence of other genital organ(s): Secondary | ICD-10-CM | POA: Insufficient documentation

## 2019-10-19 DIAGNOSIS — Z90722 Acquired absence of ovaries, bilateral: Secondary | ICD-10-CM | POA: Insufficient documentation

## 2019-10-19 DIAGNOSIS — I119 Hypertensive heart disease without heart failure: Secondary | ICD-10-CM | POA: Insufficient documentation

## 2019-10-19 DIAGNOSIS — Z7982 Long term (current) use of aspirin: Secondary | ICD-10-CM | POA: Insufficient documentation

## 2019-10-19 DIAGNOSIS — Z170 Estrogen receptor positive status [ER+]: Secondary | ICD-10-CM | POA: Diagnosis not present

## 2019-10-19 DIAGNOSIS — D631 Anemia in chronic kidney disease: Secondary | ICD-10-CM | POA: Diagnosis not present

## 2019-10-19 DIAGNOSIS — Z79899 Other long term (current) drug therapy: Secondary | ICD-10-CM | POA: Diagnosis not present

## 2019-10-19 LAB — CMP (CANCER CENTER ONLY)
ALT: 15 U/L (ref 0–44)
AST: 20 U/L (ref 15–41)
Albumin: 3.8 g/dL (ref 3.5–5.0)
Alkaline Phosphatase: 72 U/L (ref 38–126)
Anion gap: 11 (ref 5–15)
BUN: 26 mg/dL — ABNORMAL HIGH (ref 8–23)
CO2: 25 mmol/L (ref 22–32)
Calcium: 8.8 mg/dL — ABNORMAL LOW (ref 8.9–10.3)
Chloride: 99 mmol/L (ref 98–111)
Creatinine: 1.24 mg/dL — ABNORMAL HIGH (ref 0.44–1.00)
GFR, Est AFR Am: 48 mL/min — ABNORMAL LOW (ref >60)
GFR, Estimated: 41 mL/min — ABNORMAL LOW (ref >60)
Glucose, Bld: 98 mg/dL (ref 70–99)
Potassium: 3.6 mmol/L (ref 3.5–5.1)
Sodium: 135 mmol/L (ref 135–145)
Total Bilirubin: 0.4 mg/dL (ref 0.3–1.2)
Total Protein: 6.7 g/dL (ref 6.5–8.1)

## 2019-10-19 LAB — CBC WITH DIFFERENTIAL (CANCER CENTER ONLY)
Abs Immature Granulocytes: 0.03 10*3/uL (ref 0.00–0.07)
Basophils Absolute: 0.1 10*3/uL (ref 0.0–0.1)
Basophils Relative: 2 %
Eosinophils Absolute: 0.2 10*3/uL (ref 0.0–0.5)
Eosinophils Relative: 3 %
HCT: 30.6 % — ABNORMAL LOW (ref 36.0–46.0)
Hemoglobin: 10.5 g/dL — ABNORMAL LOW (ref 12.0–15.0)
Immature Granulocytes: 1 %
Lymphocytes Relative: 24 %
Lymphs Abs: 1.3 10*3/uL (ref 0.7–4.0)
MCH: 31.7 pg (ref 26.0–34.0)
MCHC: 34.3 g/dL (ref 30.0–36.0)
MCV: 92.4 fL (ref 80.0–100.0)
Monocytes Absolute: 0.5 10*3/uL (ref 0.1–1.0)
Monocytes Relative: 10 %
Neutro Abs: 3.3 10*3/uL (ref 1.7–7.7)
Neutrophils Relative %: 60 %
Platelet Count: 173 10*3/uL (ref 150–400)
RBC: 3.31 MIL/uL — ABNORMAL LOW (ref 3.87–5.11)
RDW: 12.1 % (ref 11.5–15.5)
WBC Count: 5.4 10*3/uL (ref 4.0–10.5)
nRBC: 0 % (ref 0.0–0.2)

## 2019-10-19 LAB — MAGNESIUM: Magnesium: 0.9 mg/dL — CL (ref 1.7–2.4)

## 2019-10-19 MED ORDER — TRASTUZUMAB-DKST CHEMO 150 MG IV SOLR
6.0000 mg/kg | Freq: Once | INTRAVENOUS | Status: AC
  Administered 2019-10-19: 357 mg via INTRAVENOUS
  Filled 2019-10-19: qty 17

## 2019-10-19 MED ORDER — HEPARIN SOD (PORK) LOCK FLUSH 100 UNIT/ML IV SOLN
500.0000 [IU] | Freq: Once | INTRAVENOUS | Status: AC | PRN
  Administered 2019-10-19: 500 [IU]
  Filled 2019-10-19: qty 5

## 2019-10-19 MED ORDER — SODIUM CHLORIDE 0.9% FLUSH
10.0000 mL | Freq: Once | INTRAVENOUS | Status: AC
  Administered 2019-10-19: 10 mL
  Filled 2019-10-19: qty 10

## 2019-10-19 MED ORDER — DIPHENHYDRAMINE HCL 25 MG PO CAPS
ORAL_CAPSULE | ORAL | Status: AC
  Filled 2019-10-19: qty 1

## 2019-10-19 MED ORDER — ACETAMINOPHEN 325 MG PO TABS
ORAL_TABLET | ORAL | Status: AC
  Filled 2019-10-19: qty 2

## 2019-10-19 MED ORDER — SODIUM CHLORIDE 0.9 % IV SOLN
6.0000 g | Freq: Once | INTRAVENOUS | Status: AC
  Administered 2019-10-19: 6 g via INTRAVENOUS
  Filled 2019-10-19: qty 12

## 2019-10-19 MED ORDER — SODIUM CHLORIDE 0.9% FLUSH
10.0000 mL | INTRAVENOUS | Status: DC | PRN
  Administered 2019-10-19: 10 mL
  Filled 2019-10-19: qty 10

## 2019-10-19 MED ORDER — SODIUM CHLORIDE 0.9 % IV SOLN
Freq: Once | INTRAVENOUS | Status: AC
  Filled 2019-10-19: qty 250

## 2019-10-19 MED ORDER — DIPHENHYDRAMINE HCL 25 MG PO CAPS
25.0000 mg | ORAL_CAPSULE | Freq: Once | ORAL | Status: AC
  Administered 2019-10-19: 11:00:00 25 mg via ORAL

## 2019-10-19 MED ORDER — ACETAMINOPHEN 325 MG PO TABS
650.0000 mg | ORAL_TABLET | Freq: Once | ORAL | Status: AC
  Administered 2019-10-19: 650 mg via ORAL

## 2019-10-19 NOTE — Telephone Encounter (Signed)
Called [facility] to check on referral. They will be calling patient to schedule appt in July, 2021.

## 2019-10-19 NOTE — Progress Notes (Signed)
[place] OFFICE PROGRESS NOTE  Patient Care Team: Burnard Bunting, [name]  ASSESSMENT & PLAN:  Endometrial cancer [facility]) We will follow-up with repeat CT scan in a few months, around September of this year In the meantime, we will continue [name] treatment indefinitely   Hypomagnesemia The cause of the low magnesium is unknown [name] is not known to cause low magnesium We discussed nephrology referral and she agreed She will receive IV magnesium replacement today  CKD (chronic kidney disease), stage III ([place]) Renal function is stable. Monitor closely We discussed nephrology consult and she agreed to proceed  Anemia in neoplastic disease She has multifactorial anemia, anemia secondary to prior chemotherapy as well as chronic kidney disease Anemia is improving/stable Recent vitamin B12 level was adequate She is not symptomatic Observe only   Orders Placed This Encounter  Procedures  . Magnesium    Standing Status:   Standing    Number of Occurrences:   22    Standing Expiration Date:   10/18/2020    All questions were answered. The patient knows to call the clinic with any problems, questions or concerns. The total time spent in the appointment was 20 minutes encounter with patients including review of chart and various tests results, discussions about plan of care and coordination of care plan   [name], MD 10/19/2019 3:50 PM  INTERVAL HISTORY: Please see below for problem oriented charting. She returns for treatment and follow-up She is doing well She had excellent time with family in [place] No recent signs or symptoms of congestive heart failure She has chronic mild bilateral lower extremity edema, stable No recent new lymphadenopathy  SUMMARY OF ONCOLOGIC HISTORY: Oncology History Overview Note  AANSHI Cervantes  has a remote history of left  breast cancer at age 79 but received BRCA testing approximately 5  years ago which was negative. Her cancer was treated with surgery but no radiation or chemotherapy.   Serous endometrial cancer, MSI stable ER positive, PR neg, Her2/neu 3+      Endometrial cancer ([place])  08/29/2016 Pathology Results   Endometrium, biopsy - HIGH GRADE ENDOMETRIAL CARCINOMA, SEE COMMENT. Microscopic Comment The sections show multiple fragments of adenocarcinoma displaying glandular and papillary patterns associated with high grade cytomorphology characterized by nuclear pleomorphism, prominent nucleoli and brisk mitosis. Immunohistochemical stains show that the tumor cells are positive for vimentin, p16, p53 with increased Ki-67 expression. Estrogen and progesterone receptor stains show patchy weak positivity. No significant positivity is seen with CEA. The findings are consistent with high grade endometrial carcinoma and the overall morphology and phenotypic features favor serous carcinoma.   08/29/2016 Initial Diagnosis   She presented with postmenopausal bleeding   10/03/2016 Imaging   CT C/A/P 09/2016 IMPRESSION: 1. Thickening of the endometrial canal up to 19 mm in fundus, presumably corresponding to the patient's reported endometrial carcinoma. 2. Multiple tiny pulmonary nodules scattered throughout the lungs bilaterally measuring 4 mm or less in size. Nodules of this size are typically considered statistically likely benign. In the setting of known primary malignancy, metastatic disease to the lungs is not excluded, but is not strongly favored on today's examination. Attention on followup studies is recommended to ensure the stability or resolution of these nodules. 3. Subcentimeter low-attenuation lesion in the central aspect of segment 8 of the liver is too small to characterize. This is statistically likely a tiny cyst, but warrants attention on follow-up studies to exclude the possibility of a  solitary hepatic metastasis. 4. 1.5 x 1.5 x 1.7 cm well-circumscribed lesion in  the proximal stomach. This is of uncertain etiology and significance, and could represent a benign gastric polyp, however, further evaluation with nonemergent endoscopy is suggested in the near future for further evaluation. 5. **An incidental finding of potential clinical significance has been found. 1.1 x 1.6 cm thyroid nodule in the inferior aspect of the right lobe of the thyroid gland. Follow-up evaluation with nonemergent thyroid ultrasound is recommended in the near future to better evaluate this finding. This recommendation follows ACR consensuss guidelines: Managing Incidental Thyroid Nodules Detected on Imaging: White Paper of the ACR Incidental Thyroid Findings Committee. J Am Coll Radiol 2015;12(2):143-150.** 6. Aortic atherosclerosis, in addition to left anterior descending coronary artery disease   10/22/2016 Surgery   Robotic assisted total hysterectomy, BSO and bilateral pelvic lymphadenectomy  Final pathology revealed a 3cm polyp containing serous carcinoma but with no myometrial invasion, no LVSI and negative nodes.  Stage IA Uterine serous cancer   10/22/2016 Pathology Results   1. Lymph nodes, regional resection, right pelvic - SIX BENIGN LYMPH NODES (0/6). 2. Lymph nodes, regional resection, left pelvic - SEVEN BENIGN LYMPH NODES (0/7). 3. Uterus +/- tubes/ovaries, neoplastic, with right ovary and fallopian tube ENDOMYOMETRIUM - SEROUS CARCINOMA ARISING WITHIN AN ENDOMETRIAL POLYP - NO MYOMETRIAL INVASION IDENTIFIED - ADENOMYOSIS - LEIOMYOMA (1 CM) - SEE ONCOLOGY TABLE AND COMMENT CERVIX - CARCINOMA FOCALLY INVOLVES ENDOCERVICAL GLANDS - NABOTHIAN CYSTS RIGHT ADNEXA - BENIGN OVARY AND FALLOPIAN TUBE - NO CARCINOMA IDENTIFIED 4. Cul-de-sac biopsy - MESOTHELIAL HYPERPLASIA Microscopic Comment 3. ONCOLOGY TABLE-UTERUS, CARCINOMA OR CARCINOSARCOMA Specimen: Uterus, right fallopian tube and ovary Procedure: Total hysterectomy and right salpingo-oophorectomy Lymph node  sampling performed: Bilateral pelvic regional resection Specimen integrity: Intact Maximum tumor size: 3 cm (polyp) Histologic type: Serous carcinoma Grade: High grade Myometrial invasion: Not identified Cervical stromal involvement: No, focal endocervical gland involvement Extent of involvement of other organs: Not identified Lymph - vascular invasion: Not identified Peritoneal washings: N/A Lymph nodes: Examined: 0 Sentinel 13 Non-sentinel 13 Total Lymph nodes with metastasis: 0 Isolated tumor cells (< 0.2 mm): 0 Micrometastasis: (> 0.2 mm and < 2.0 mm): 0 Macrometastasis: (> 2.0 mm): 0 Extracapsular extension: N/A Pelvic lymph nodes: 0 involved of 13 lymph nodes. Para-aortic lymph nodes: No para-aortic nodes submitted TNM code: pT1a, pNX FIGO Stage (based on pathologic findings, needs clinical correlation): IA Comment: Immunohistochemistry for cytokeratin AE1/AE3 is performed on all of the lymph nodes (parts 1 & 2) and no metastatic carcinoma is identified.   07/04/2017 Genetic Testing   The patient had genetic testing due to a personal history of breast and uterine cancer, and a family history of stomach cancer.  The Multi-Cancer Panel was ordered. The Multi-Cancer Panel offered by [facility] includes sequencing and/or deletion duplication testing of the following 83 genes: ALK, APC, ATM, AXIN2,BAP1,  BARD1, BLM, BMPR1A, BRCA1, BRCA2, BRIP1, CASR, CDC73, CDH1, CDK4, CDKN1B, CDKN1C, CDKN2A (p14ARF), CDKN2A (p16INK4a), CEBPA, CHEK2, CTNNA1, DICER1, DIS3L2, EGFR (c.2369C>T, p.Thr790Met variant only), EPCAM (Deletion/duplication testing only), FH, FLCN, GATA2, GPC3, GREM1 (Promoter region deletion/duplication testing only), HOXB13 (c.251G>A, p.Gly84Glu), HRAS, KIT, MAX, MEN1, MET, MITF (c.952G>A, p.Glu318Lys variant only), MLH1, MSH2, MSH3, MSH6, MUTYH, NBN, NF1, NF2, NTHL1, PALB2, PDGFRA, PHOX2B, PMS2, POLD1, POLE, POT1, PRKAR1A, PTCH1, PTEN, RAD50, RAD51C, RAD51D, RB1, RECQL4, RET, RUNX1,  SDHAF2, SDHA (sequence changes only), SDHB, SDHC, SDHD, SMAD4, SMARCA4, SMARCB1, SMARCE1, STK11, SUFU, TERC, TERT, TMEM127, TP53, TSC1, TSC2, VHL, WRN and WT1.   Results:  No pathogenic variants were identified.  A variant of uncertain significance in the gene APC was identified.  c.791A>G (p.Gln264Arg).  The date of this test report is 07/04/2017.     Genetic Testing   Patient has genetic testing done for MSI. 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    12/03/2017 Imaging   CT Chest/Abd/Pelvis to follow pulmonary nodule and gastric mass IMPRESSION: 1. Stable CT of the chest. Small pulmonary nodules are unchanged when compared with previous exam. 2. No new findings identified. 3. Subcentimeter low-attenuation lesions within the liver are remain too small to characterize but are stable from prior exam. 4. Persistent indeterminate low-attenuation structure within the proximal stomach is unchanged measuring 1.4 cm. Correlation with direct visualization is advised   06/30/2018 Imaging   CT CHEST Lungs/Pleura: Stable scattered sub-cm pulmonary nodules are again seen bilaterally and are stable compared to previous studies. No new or enlarging pulmonary nodules or masses identified. No evidence of pulmonary infiltrate or pleural effusion.   08/19/2018 Echocardiogram   [place] is done in [place]: EF 60-65%. Mild impaired relaxation. (report scanned)   09/17/2018 Relapse/Recurrence   Presented with c/o vagina discharge.  Lesion noted at the left vaginal apex 82m lesion removed Path c/w high grade serous cancer   09/17/2018 Pathology Results   Vagina, biopsy, left apex - HIGH GRADE SEROUS CARCINOMA.   09/28/2018 PET scan   1. Two hypermetabolic axial lymph nodes. Unusual site for metastatic endometrial carcinoma however the activity is more intense than typically seen in reactive adenopathy. Suggest ultrasound-guided percutaneous biopsy of the larger RIGHT axial lymph  node 2. No evidence of local recurrence at the vaginal cuff.  3. No metastatic adenopathy in the abdomen or pelvis. 4. Stable small pulmonary nodules   10/09/2018 Pathology Results   Lymph node, needle/core biopsy, right axilla - METASTATIC CARCINOMA, SEE COMMENT. Microscopic Comment The carcinoma appears high grade. Immunohistochemistry is positive for cytokeratin 7, PAX-8, and ER. Cytokeratin 20, CDX-2, PR, and GATA-3 are negative. The findings along with the history are consistent with a gynecologic primary.    10/09/2018 Procedure   Ultrasound-guided core biopsies of a right axillary lymph node.   10/14/2018 Cancer Staging   Staging form: Corpus Uteri - Carcinoma and Carcinosarcoma, AJCC 8th Edition - Clinical: Stage IVB (cT1, cN0, pM1) - Signed by GHeath Lark MD on 10/14/2018   10/19/2018 Imaging   Placement of single lumen port a cath via right internal jugular vein. The catheter tip lies at the cavo-atrial junction. A power injectable port a cath was placed and is ready for immediate use.    10/21/2018 -  Chemotherapy   The patient had carboplatin and taxol for treatment   11/11/2018 -  Chemotherapy   The patient had trastuzumab ([name]) 450 mg in sodium chloride 0.9 % 250 mL chemo infusion, 483 mg, Intravenous,  Once, 6 of 6 cycles Administration: 450 mg (11/11/2018), 378 mg (12/02/2018), 378 mg (02/03/2019), 378 mg (12/23/2018), 378 mg (01/13/2019), 378 mg (02/24/2019) trastuzumab-dkst (OGIVRI) 378 mg in sodium chloride 0.9 % 250 mL chemo infusion, 6 mg/kg = 378 mg (100 % of original dose 6 mg/kg), Intravenous,  Once, 11 of 13 cycles Dose modification: 6 mg/kg (original dose 6 mg/kg, Cycle 7, Reason: Other (see comments)) Administration: 378 mg (03/24/2019), 378 mg (04/14/2019), 378 mg (05/05/2019), 378 mg (05/26/2019), 378 mg (06/16/2019), 378 mg (07/06/2019), 357 mg (07/28/2019), 357 mg (08/18/2019), 357 mg (09/07/2019), 357 mg (09/28/2019), 357 mg (10/19/2019)  for chemotherapy treatment.  12/21/2018 PET scan   1. Interval resolution of hypermetabolic right axillary lymph nodes. No metabolic findings highly suspicious for recurrent metastatic disease. 2. New mild hypermetabolism within a borderline prominent portacaval lymph node, nonspecific. While a reactive node is favored, a lymph node metastasis cannot be entirely excluded. Suggest attention to this lymph node on follow-up PET-CT in 3-6 months. 3. Nonspecific new hypermetabolism at the ileocecal valve, more likely physiologic given absence of CT correlate. 4. Scattered subcentimeter pulmonary nodules are all stable and below PET resolution, more likely benign, continued CT surveillance advised. 5. Chronic findings include: Aortic Atherosclerosis (ICD10-I70.0). Marked diffuse colonic diverticulosis. Coronary atherosclerosis.    12/28/2018 Echocardiogram   1. The left ventricle has normal systolic function with an ejection fraction of 60-65%. The cavity size was normal. Left ventricular diastolic Doppler parameters are consistent with impaired relaxation.  2. GLS recorded as -10.7 but LV appears hyperdynamic and tracking of endocardium appears poor.  3. The right ventricle has normal systolic function. The cavity was normal. There is no increase in right ventricular wall thickness.  4. Mild thickening of the mitral valve leaflet.  5. The aortic valve was not well visualized. Mild thickening of the aortic valve. Aortic valve regurgitation is trivial by color flow Doppler.   03/23/2019 Imaging   PET 1. No findings of hypermetabolic residual/recurrent or metastatic disease. 2. Similar low-level hypermetabolism within normal sized portocaval and right inguinal nodes, favored to be reactive. 3. Ongoing stability of small bilateral pulmonary nodules, favored to be benign. Below PET resolution. 4. Coronary artery atherosclerosis. Aortic Atherosclerosis   03/26/2019 Echocardiogram   1. The left ventricle has normal systolic  function, with an ejection fraction of 55-60%. The cavity size was normal. Left ventricular diastolic Doppler parameters are consistent with impaired relaxation.  2. The right ventricle has normal systolic function. The cavity was normal.  3. The mitral valve is abnormal. Mild thickening of the mitral valve leaflet. There is mild mitral annular calcification present.  4. The tricuspid valve is grossly normal.  5. The aortic valve is tricuspid. Mild calcification of the aortic valve. No stenosis of the aortic valve.  6. The aorta is normal unless otherwise noted.  7. Normal LV systolic function; grade 1 diastolic dysfunction; QJF-35.4%.   06/30/2019 Imaging   Chest Impression:   1. No evidence thoracic metastasis. 2. Stable small benign-appearing pulmonary nodules   Abdomen / Pelvis Impression:   1. No evidence of metastatic disease in the abdomen pelvis. 2.  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 3.  Aortic Atherosclerosis (ICD10-I70.0).   06/30/2019 Echocardiogram    1. Left ventricular ejection fraction, by visual estimation, is 60 to 65%. The left ventricle has normal function. There is no left ventricular hypertrophy.  2. Left ventricular diastolic parameters are consistent with Grade I diastolic dysfunction (impaired relaxation).  3. The left ventricle has no regional wall motion abnormalities.  4. Global right ventricle has normal systolic function.The right ventricular size is normal. No increase in right ventricular wall thickness.  5. Left atrial size was mild-moderately dilated.  6. Right atrial size was normal.  7. The mitral valve is normal in structure. Trivial mitral valve regurgitation.  8. The tricuspid valve is normal in structure. Tricuspid valve regurgitation is trivial.  9. The aortic valve is normal in structure. Aortic valve regurgitation is trivial. No evidence of aortic valve sclerosis or stenosis. 10. The pulmonic valve was grossly normal.  Pulmonic valve regurgitation is not visualized. 11. Mildly elevated pulmonary artery  systolic pressure. 12. The inferior vena cava is normal in size with greater than 50% respiratory variability, suggesting right atrial pressure of 3 mmHg. 13. The average left ventricular global longitudinal strain is -12.5 %. GLS underestimated due to poor endocardial tracking.     09/27/2019 Imaging   1. Multiple small, nonspecific pulmonary nodules are again noted. The previously noted lung nodules are unchanged in size from previous exam. There is a new lung nodule within the medial right lower lobe which is nonspecific measure 4 mm. Attention at follow-up imaging is recommended. 2. No evidence of metastatic disease within the abdomen or pelvis. 3. Coronary artery calcifications.  The 4.  Aortic Atherosclerosis (ICD10-I70.0).   09/30/2019 Echocardiogram    1. Left ventricular ejection fraction, by estimation, is 60 to 65%. The left ventricle has normal function. The left ventricle has no regional wall motion abnormalities. Left ventricular diastolic parameters are consistent with Grade I diastolic dysfunction (impaired relaxation).  2. Right ventricular systolic function is normal. The right ventricular size is normal. There is normal pulmonary artery systolic pressure. The estimated right ventricular systolic pressure is 03.4 mmHg.  3. The mitral valve is normal in structure. No evidence of mitral valve regurgitation. No evidence of mitral stenosis.  4. The aortic valve is normal in structure. Aortic valve regurgitation is trivial. No aortic stenosis is present.  5. The inferior vena cava is normal in size with greater than 50% respiratory variability, suggesting right atrial pressure of 3 mmHg.     Metastasis to lymph nodes ([place])  10/14/2018 Initial Diagnosis   Metastasis to lymph nodes ([place])   10/21/2018 - 03/16/2019 Chemotherapy   The patient had palonosetron (ALOXI) injection 0.25 mg, 0.25 mg, Intravenous,   Once, 7 of 7 cycles Administration: 0.25 mg (10/21/2018), 0.25 mg (11/11/2018), 0.25 mg (12/02/2018), 0.25 mg (12/23/2018), 0.25 mg (01/13/2019), 0.25 mg (02/03/2019), 0.25 mg (02/24/2019) CARBOplatin (PARAPLATIN) 310 mg in sodium chloride 0.9 % 250 mL chemo infusion, 310 mg, Intravenous,  Once, 7 of 7 cycles Dose modification: 300 mg (original dose 311.5 mg, Cycle 4, Reason: Dose not tolerated) Administration: 310 mg (10/21/2018), 300 mg (11/11/2018), 300 mg (12/02/2018), 300 mg (12/23/2018), 300 mg (01/13/2019), 300 mg (02/03/2019), 300 mg (02/24/2019) PACLitaxel (TAXOL) 222 mg in sodium chloride 0.9 % 250 mL chemo infusion (> 35m/m2), 135 mg/m2 = 222 mg, Intravenous,  Once, 7 of 7 cycles Administration: 222 mg (10/21/2018), 222 mg (11/11/2018), 222 mg (12/02/2018), 222 mg (12/23/2018), 222 mg (01/13/2019), 222 mg (02/03/2019), 222 mg (02/24/2019) fosaprepitant (EMEND) 150 mg, dexamethasone (DECADRON) 12 mg in sodium chloride 0.9 % 145 mL IVPB, , Intravenous,  Once, 7 of 7 cycles Administration:  (10/21/2018),  (11/11/2018),  (12/02/2018),  (12/23/2018),  (01/13/2019),  (02/03/2019),  (02/24/2019)  for chemotherapy treatment.      REVIEW OF SYSTEMS:   Constitutional: Denies fevers, chills or abnormal weight loss Eyes: Denies blurriness of vision Ears, nose, mouth, throat, and face: Denies mucositis or sore throat Respiratory: Denies cough, dyspnea or wheezes Cardiovascular: Denies palpitation, chest discomfort  Gastrointestinal:  Denies nausea, heartburn or change in bowel habits Skin: Denies abnormal skin rashes Lymphatics: Denies new lymphadenopathy or easy bruising Neurological:Denies numbness, tingling or new weaknesses Behavioral/Psych: Mood is stable, no new changes  All other systems were reviewed with the patient and are negative.  I have reviewed the past medical history, past surgical history, social history and family history with the patient and they are unchanged from previous note.  ALLERGIES:  has No Known  Allergies.  MEDICATIONS:  Current Outpatient Medications  Medication Sig Dispense Refill  . acetaminophen (TYLENOL) 500 MG tablet Take 1,000 mg by mouth every 6 (six) hours as needed.    Marland Kitchen aspirin EC 81 MG tablet Take 81 mg by mouth daily.    Marland Kitchen atorvastatin (LIPITOR) 20 MG tablet Take 20 mg by mouth every evening.     . Calcium Carbonate-Vitamin D (CALCIUM-VITAMIN D) 500-200 MG-UNIT per tablet Take 1 tablet by mouth daily.    . citalopram (CELEXA) 20 MG tablet Take 20 mg by mouth at bedtime.     . gabapentin (NEURONTIN) 600 MG tablet Take 600 mg by mouth at bedtime as needed.    Marland Kitchen levothyroxine (SYNTHROID, LEVOTHROID) 100 MCG tablet Take 100 mcg by mouth daily before breakfast.     . lidocaine-prilocaine (EMLA) cream Apply to affected area once 30 g 3  . losartan-hydrochlorothiazide (HYZAAR) 100-25 MG tablet     . magnesium oxide (MAG-OX) 400 (241.3 Mg) MG tablet Take 1 tablet (400 mg total) by mouth daily. 30 tablet 11  . metoprolol succinate (TOPROL-XL) 25 MG 24 hr tablet Take 25 mg by mouth daily.    . Multiple Vitamin (MULTIVITAMIN WITH MINERALS) TABS Take 1 tablet by mouth daily.    . ondansetron (ZOFRAN) 8 MG tablet Take 1 tablet (8 mg total) by mouth every 8 (eight) hours as needed. Start on the third day after chemotherapy. 30 tablet 1  . pantoprazole (PROTONIX) 40 MG tablet Take 40 mg by mouth every morning.     . valACYclovir (VALTREX) 1000 MG tablet valacyclovir 1 gram tablet     No current facility-administered medications for this visit.   Facility-Administered Medications Ordered in Other Visits  Medication Dose Route Frequency Provider Last Rate Last Admin  . sodium chloride flush (NS) 0.9 % injection 10 mL  10 mL Intracatheter PRN Alvy Bimler, Maurina Fawaz, MD   10 mL at 10/19/19 1501    PHYSICAL EXAMINATION: ECOG PERFORMANCE STATUS: 1 - Symptomatic but completely ambulatory  Vitals:   10/19/19 1037  BP: (!) 158/65  Pulse: 66  Resp: 18  Temp: 98.3 F (36.8 C)  SpO2: 99%    Filed Weights   10/19/19 1037  Weight: 133 lb (60.3 kg)    GENERAL:alert, no distress and comfortable SKIN: skin color, texture, turgor are normal, no rashes or significant lesions EYES: normal, Conjunctiva are pink and non-injected, sclera clear OROPHARYNX:no exudate, no erythema and lips, buccal mucosa, and tongue normal  NECK: supple, thyroid normal size, non-tender, without nodularity LYMPH:  no palpable lymphadenopathy in the cervical, axillary or inguinal LUNGS: clear to auscultation and percussion with normal breathing effort HEART: regular rate & rhythm and no murmurs with mild bilateral lower extremity edema ABDOMEN:abdomen soft, non-tender and normal bowel sounds Musculoskeletal:no cyanosis of digits and no clubbing  NEURO: alert & oriented x 3 with fluent speech, no focal motor/sensory deficits  LABORATORY DATA:  I have reviewed the data as listed    Component Value Date/Time   NA 135 10/19/2019 1010   NA 139 02/13/2017 1512   K 3.6 10/19/2019 1010   K 4.2 02/13/2017 1512   CL 99 10/19/2019 1010   CO2 25 10/19/2019 1010   CO2 28 02/13/2017 1512   GLUCOSE 98 10/19/2019 1010   GLUCOSE 118 02/13/2017 1512   BUN 26 (H) 10/19/2019 1010   BUN 25.2 02/13/2017 1512   CREATININE 1.24 (H) 10/19/2019 1010   CREATININE 1.2 (H) 02/13/2017 1512   CALCIUM 8.8 (L) 10/19/2019 1010  CALCIUM 10.0 02/13/2017 1512   PROT 6.7 10/19/2019 1010   ALBUMIN 3.8 10/19/2019 1010   AST 20 10/19/2019 1010   ALT 15 10/19/2019 1010   ALKPHOS 72 10/19/2019 1010   BILITOT 0.4 10/19/2019 1010   GFRNONAA 41 (L) 10/19/2019 1010   GFRAA 48 (L) 10/19/2019 1010    No results found for: SPEP, UPEP  Lab Results  Component Value Date   WBC 5.4 10/19/2019   NEUTROABS 3.3 10/19/2019   HGB 10.5 (L) 10/19/2019   HCT 30.6 (L) 10/19/2019   MCV 92.4 10/19/2019   PLT 173 10/19/2019      Chemistry      Component Value Date/Time   NA 135 10/19/2019 1010   NA 139 02/13/2017 1512   K 3.6  10/19/2019 1010   K 4.2 02/13/2017 1512   CL 99 10/19/2019 1010   CO2 25 10/19/2019 1010   CO2 28 02/13/2017 1512   BUN 26 (H) 10/19/2019 1010   BUN 25.2 02/13/2017 1512   CREATININE 1.24 (H) 10/19/2019 1010   CREATININE 1.2 (H) 02/13/2017 1512      Component Value Date/Time   CALCIUM 8.8 (L) 10/19/2019 1010   CALCIUM 10.0 02/13/2017 1512   ALKPHOS 72 10/19/2019 1010   AST 20 10/19/2019 1010   ALT 15 10/19/2019 1010   BILITOT 0.4 10/19/2019 1010       RADIOGRAPHIC STUDIES: I have personally reviewed the radiological images as listed and agreed with the findings in the report. CT CHEST W CONTRAST  Result Date: 09/27/2019 CLINICAL DATA:  Uterine/cervical cancer.  Restaging. EXAM: CT CHEST, ABDOMEN, AND PELVIS WITH CONTRAST TECHNIQUE: Multidetector CT imaging of the chest, abdomen and pelvis was performed following the standard protocol during bolus administration of intravenous contrast. CONTRAST:  46m OMNIPAQUE IOHEXOL 300 MG/ML  SOLN COMPARISON:  06/30/2019 FINDINGS: CT CHEST FINDINGS Cardiovascular: The heart size appears normal. No pericardial effusion identified. Aortic atherosclerosis. Lad coronary artery calcification. Mediastinum/Nodes: Unchanged low-attenuation thyroid nodules measuring up to 9 mm. The trachea appears patent and is midline. Normal appearance of the esophagus. No mediastinal, hilar, axillary, or supraclavicular adenopathy. Lungs/Pleura: No pleural effusion identified. Multiple scattered tiny lung nodules are again noted in both lungs: -Unchanged 3 mm posterior right upper lobe lung nodule, image 50/6. -Anterior right middle lobe lung nodule is unchanged measuring 5 mm, image 83/6. -3 mm left lower lobe lung nodule is stable, image 81/6. -2 mm posterolateral left upper lobe lung nodule is unchanged, image 74/6. -4 mm right lower lobe lung nodule is new from previous exam, image 112/6. Musculoskeletal: No chest wall mass or suspicious bone lesions identified. CT ABDOMEN  PELVIS FINDINGS Hepatobiliary: Unchanged 8 mm low-density structure within segment 8, image 48/2. Too small to reliably characterize. No new suspicious liver abnormality. Gallbladder unremarkable. No biliary dilatation. Pancreas: Unremarkable. No pancreatic ductal dilatation or surrounding inflammatory changes. Spleen: Normal in size without focal abnormality. Adrenals/Urinary Tract: Normal appearance of the adrenal glands. No kidney mass or hydronephrosis identified. Urinary bladder is unremarkable. Stomach/Bowel: Stomach is nondistended. No bowel wall thickening, inflammation or distension. Scattered colonic diverticula noted without acute inflammation. Vascular/Lymphatic: Aortic atherosclerosis. No aneurysm. No abdominopelvic adenopathy identified. No pelvic or inguinal adenopathy. Reproductive: Status post hysterectomy. No adnexal masses. Other: No ascites or focal fluid collections. No peritoneal nodules or mass. Musculoskeletal: No acute or significant osseous findings. 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 Anterolisthesis of L4 on L5, and L5 on S1 noted. Bilateral facet hypertrophy and degenerative change.  IMPRESSION: 1. Multiple small, nonspecific pulmonary nodules are again noted. The previously noted lung nodules are unchanged in size from previous exam. There is a new lung nodule within the medial right lower lobe which is nonspecific measure 4 mm. Attention at follow-up imaging is recommended. 2. No evidence of metastatic disease within the abdomen or pelvis. 3. Coronary artery calcifications.  The 4.  Aortic Atherosclerosis (ICD10-I70.0). Electronically Signed   By: Kerby Moors M.D.   On: 09/27/2019 13:16   CT ABDOMEN PELVIS W CONTRAST  Result Date: 09/27/2019 CLINICAL DATA:  Uterine/cervical cancer.  Restaging. EXAM: CT CHEST, ABDOMEN, AND PELVIS WITH CONTRAST TECHNIQUE: Multidetector CT imaging of the chest, abdomen and pelvis was performed following the  standard protocol during bolus administration of intravenous contrast. CONTRAST:  26m OMNIPAQUE IOHEXOL 300 MG/ML  SOLN COMPARISON:  06/30/2019 FINDINGS: CT CHEST FINDINGS Cardiovascular: The heart size appears normal. No pericardial effusion identified. Aortic atherosclerosis. Lad coronary artery calcification. Mediastinum/Nodes: Unchanged low-attenuation thyroid nodules measuring up to 9 mm. The trachea appears patent and is midline. Normal appearance of the esophagus. No mediastinal, hilar, axillary, or supraclavicular adenopathy. Lungs/Pleura: No pleural effusion identified. Multiple scattered tiny lung nodules are again noted in both lungs: -Unchanged 3 mm posterior right upper lobe lung nodule, image 50/6. -Anterior right middle lobe lung nodule is unchanged measuring 5 mm, image 83/6. -3 mm left lower lobe lung nodule is stable, image 81/6. -2 mm posterolateral left upper lobe lung nodule is unchanged, image 74/6. -4 mm right lower lobe lung nodule is new from previous exam, image 112/6. Musculoskeletal: No chest wall mass or suspicious bone lesions identified. CT ABDOMEN PELVIS FINDINGS Hepatobiliary: Unchanged 8 mm low-density structure within segment 8, image 48/2. Too small to reliably characterize. No new suspicious liver abnormality. Gallbladder unremarkable. No biliary dilatation. Pancreas: Unremarkable. No pancreatic ductal dilatation or surrounding inflammatory changes. Spleen: Normal in size without focal abnormality. Adrenals/Urinary Tract: Normal appearance of the adrenal glands. No kidney mass or hydronephrosis identified. Urinary bladder is unremarkable. Stomach/Bowel: Stomach is nondistended. No bowel wall thickening, inflammation or distension. Scattered colonic diverticula noted without acute inflammation. Vascular/Lymphatic: Aortic atherosclerosis. No aneurysm. No abdominopelvic adenopathy identified. No pelvic or inguinal adenopathy. Reproductive: Status post hysterectomy. No adnexal  masses. Other: No ascites or focal fluid collections. No peritoneal nodules or mass. Musculoskeletal: No acute or significant osseous findings. 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 Anterolisthesis of L4 on L5, and L5 on S1 noted. Bilateral facet hypertrophy and degenerative change. IMPRESSION: 1. Multiple small, nonspecific pulmonary nodules are again noted. The previously noted lung nodules are unchanged in size from previous exam. There is a new lung nodule within the medial right lower lobe which is nonspecific measure 4 mm. Attention at follow-up imaging is recommended. 2. No evidence of metastatic disease within the abdomen or pelvis. 3. Coronary artery calcifications.  The 4.  Aortic Atherosclerosis (ICD10-I70.0). Electronically Signed   By: TKerby MoorsM.D.   On: 09/27/2019 13:16   ECHOCARDIOGRAM COMPLETE  Result Date: 10/01/2019    ECHOCARDIOGRAM REPORT   Patient Name:   SFrances MaywoodDate of Exam: 09/30/2019 Medical Rec #:  [id number]     Height:       62.2 in Accession #:    [id number]    Weight:       131.2 lb Date of Birth:  [birth year]     BSA:          1.602 m Patient Age:  78 years       BP:           138/69 mmHg Patient Gender: F              HR:           76 bpm. Exam Location:  Outpatient Procedure: 2D Echo and Strain Analysis Indications:    Chemotherapy evaluation v87.41 / v58.11  History:        Patient has prior history of Echocardiogram examinations, most                 recent 06/30/2019. Risk Factors:Hypertension and Dyslipidemia.                 GERD. Breast Cancer.  Sonographer:    Darlina Sicilian RDCS Referring Phys: [id number] Laniah Grimm [place]  1. Left ventricular ejection fraction, by estimation, is 60 to 65%. The left ventricle has normal function. The left ventricle has no regional wall motion abnormalities. Left ventricular diastolic parameters are consistent with Grade I diastolic dysfunction (impaired relaxation).  2. Right  ventricular systolic function is normal. The right ventricular size is normal. There is normal pulmonary artery systolic pressure. The estimated right ventricular systolic pressure is 81.8 mmHg.  3. The mitral valve is normal in structure. No evidence of mitral valve regurgitation. No evidence of mitral stenosis.  4. The aortic valve is normal in structure. Aortic valve regurgitation is trivial. No aortic stenosis is present.  5. The inferior vena cava is normal in size with greater than 50% respiratory variability, suggesting right atrial pressure of 3 mmHg. FINDINGS  Left Ventricle: Global longitudinal strain performed but not reported due to suboptimal tracking. Left ventricular ejection fraction, by estimation, is 60 to 65%. The left ventricle has normal function. The left ventricle has no regional wall motion abnormalities. The left ventricular internal cavity size was normal in size. There is borderline asymmetric left ventricular hypertrophy of the basal-septal segment. Left ventricular diastolic parameters are consistent with Grade I diastolic dysfunction (impaired relaxation). Right Ventricle: The right ventricular size is normal. No increase in right ventricular wall thickness. Right ventricular systolic function is normal. There is normal pulmonary artery systolic pressure. The tricuspid regurgitant velocity is 2.30 m/s, and  with an assumed right atrial pressure of 3 mmHg, the estimated right ventricular systolic pressure is 40.3 mmHg. Left Atrium: Left atrial size was normal in size. Right Atrium: Right atrial size was normal in size. Pericardium: There is no evidence of pericardial effusion. Mitral Valve: The mitral valve is normal in structure. Normal mobility of the mitral valve leaflets. No evidence of mitral valve regurgitation. No evidence of mitral valve stenosis. Tricuspid Valve: The tricuspid valve is normal in structure. Tricuspid valve regurgitation is trivial. No evidence of tricuspid  stenosis. Aortic Valve: The aortic valve is normal in structure. Aortic valve regurgitation is trivial. No aortic stenosis is present. Pulmonic Valve: The pulmonic valve was normal in structure. Pulmonic valve regurgitation is not visualized. No evidence of pulmonic stenosis. Aorta: The aortic root is normal in size and structure. Venous: The inferior vena cava is normal in size with greater than 50% respiratory variability, suggesting right atrial pressure of 3 mmHg. IAS/Shunts: No atrial level shunt detected by color flow Doppler.  LEFT VENTRICLE PLAX 2D LVIDd:         3.40 cm  Diastology LVIDs:         2.70 cm  LV e' lateral:   6.96 cm/s LV PW:  0.80 cm  LV E/e' lateral: 11.7 LV IVS:        1.00 cm  LV e' medial:    6.42 cm/s LVOT diam:     1.70 cm  LV E/e' medial:  12.7 LV SV:         47 LV SV Index:   29 LVOT Area:     2.27 cm  RIGHT VENTRICLE RV S prime:     11.30 cm/s TAPSE (M-mode): 2.3 cm LEFT ATRIUM             Index       RIGHT ATRIUM           Index LA diam:        3.30 cm 2.06 cm/m  RA Area:     11.20 cm LA Vol (A2C):   61.4 ml 38.33 ml/m RA Volume:   23.50 ml  14.67 ml/m LA Vol (A4C):   31.9 ml 19.92 ml/m LA Biplane Vol: 45.0 ml 28.09 ml/m  AORTIC VALVE LVOT Vmax:   99.90 cm/s LVOT Vmean:  67.900 cm/s LVOT VTI:    0.208 m  AORTA Ao Root diam: 2.80 cm MITRAL VALVE               TRICUSPID VALVE MV Area (PHT): 3.54 cm    TR Peak grad:   21.2 mmHg MV Decel Time: 214 msec    TR Vmax:        230.00 cm/s MV E velocity: 81.40 cm/s MV A velocity: 90.80 cm/s  SHUNTS MV E/A ratio:  0.90        Systemic VTI:  0.21 m                            Systemic Diam: 1.70 cm Cherlynn Kaiser MD Electronically signed by Cherlynn Kaiser MD Signature Date/Time: 10/01/2019/6:32:59 PM    Final

## 2019-10-19 NOTE — Assessment & Plan Note (Signed)
We will follow-up with repeat CT scan in a few months, around September of this year In the meantime, we will continue [name] treatment indefinitely

## 2019-10-19 NOTE — Assessment & Plan Note (Signed)
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 

## 2019-10-19 NOTE — Progress Notes (Signed)
Reported lab of Magnesium 0.9 to Dr. Alvy Bimler.

## 2019-10-19 NOTE — Telephone Encounter (Signed)
Ok They told the patient last week they never received the referral Please let her know, she is still in infusion room

## 2019-10-19 NOTE — Telephone Encounter (Signed)
Critical Value:  Magnesium 0.9  Hassan Rowan in Dr. Calton Dach office notified.

## 2019-10-19 NOTE — Patient Instructions (Signed)
[facility] Discharge Instructions for Patients Receiving Chemotherapy  Today you received the following chemotherapy agents: trastuzumab (Ogivri).  To help prevent nausea and vomiting after your treatment, we encourage you to take your nausea medication as directed.   If you develop nausea and vomiting that is not controlled by your nausea medication, call the clinic.   BELOW ARE SYMPTOMS THAT SHOULD BE REPORTED IMMEDIATELY:  *FEVER GREATER THAN 100.5 F  *CHILLS WITH OR WITHOUT FEVER  NAUSEA AND VOMITING THAT IS NOT CONTROLLED WITH YOUR NAUSEA MEDICATION  *UNUSUAL SHORTNESS OF BREATH  *UNUSUAL BRUISING OR BLEEDING  TENDERNESS IN MOUTH AND THROAT WITH OR WITHOUT PRESENCE OF ULCERS  *URINARY PROBLEMS  *BOWEL PROBLEMS  UNUSUAL RASH Items with * indicate a potential emergency and should be followed up as soon as possible.  Feel free to call the clinic should you have any questions or concerns. The clinic phone number is [phone].  Please show the [facility] at check-in to the Emergency Department and triage nurse.  Hypomagnesemia Hypomagnesemia is a condition in which the level of magnesium in the blood is low. Magnesium is a mineral that is found in many foods. It is used in many different processes in the body. Hypomagnesemia can affect every organ in the body. In severe cases, it can cause life-threatening problems. What are the causes? This condition may be caused by:  Not getting enough magnesium in your diet.  Malnutrition.  Problems with absorbing magnesium from the intestines.  Dehydration.  Alcohol abuse.  Vomiting.  Severe or chronic diarrhea.  Some medicines, including medicines that make you urinate more (diuretics).  Certain diseases, such as kidney disease, diabetes, celiac disease, and overactive thyroid. What are the signs or symptoms? Symptoms of this condition include:  Loss of appetite.  Nausea and  vomiting.  Involuntary shaking or trembling of a body part (tremor).  Muscle weakness.  Tingling in the arms and legs.  Sudden tightening of muscles (muscle spasms).  Confusion.  Psychiatric issues, such as depression, irritability, or psychosis.  A feeling of fluttering of the heart.  Seizures. These symptoms are more severe if magnesium levels drop suddenly. How is this diagnosed? This condition may be diagnosed based on:  Your symptoms and medical history.  A physical exam.  Blood and urine tests. How is this treated? Treatment depends on the cause and the severity of the condition. It may be treated with:  A magnesium supplement. This can be taken in pill form. If the condition is severe, magnesium is usually given through an IV.  Changes to your diet. You may be directed to eat foods that have a lot of magnesium, such as green leafy vegetables, peas, beans, and nuts.  Stopping any intake of alcohol. Follow these instructions at home:      Make sure that your diet includes foods with magnesium. Foods that have a lot of magnesium in them include: ? Green leafy vegetables, such as spinach and broccoli. ? Beans and peas. ? Nuts and seeds, such as almonds and sunflower seeds. ? Whole grains, such as whole grain bread and fortified cereals.  Take magnesium supplements if your health care provider tells you to do that. Take them as directed.  Take over-the-counter and prescription medicines only as told by your health care provider.  Have your magnesium levels monitored as told by your health care provider.  When you are active, drink fluids that contain electrolytes.  Avoid drinking alcohol.  Keep all follow-up  visits as told by your health care provider. This is important. Contact a health care provider if:  You get worse instead of better.  Your symptoms return. Get help right away if you:  Develop severe muscle weakness.  Have trouble  breathing.  Feel that your heart is racing. Summary  Hypomagnesemia is a condition in which the level of magnesium in the blood is low.  Hypomagnesemia can affect every organ in the body.  Treatment may include eating more foods that contain magnesium, taking magnesium supplements, and not drinking alcohol.  Have your magnesium levels monitored as told by your health care provider. This information is not intended to replace advice given to you by your health care provider. Make sure you discuss any questions you have with your health care provider. Document Revised: 06/13/2017 Document Reviewed: 06/02/2017 Elsevier Patient Education  2020 [employer].

## 2019-10-19 NOTE — Assessment & Plan Note (Signed)
Renal function is stable. Monitor closely We discussed nephrology consult and she agreed to proceed

## 2019-10-19 NOTE — Patient Instructions (Signed)

## 2019-10-19 NOTE — Telephone Encounter (Signed)
Called and given below message about appt in July. Told her [facility] would call her to schedule. She verbalized understanding.

## 2019-10-19 NOTE — Assessment & Plan Note (Signed)
The cause of the low magnesium is unknown [name] is not known to cause low magnesium We discussed nephrology referral and she agreed She will receive IV magnesium replacement today

## 2019-10-22 ENCOUNTER — Telehealth: Payer: Self-pay | Admitting: Hematology and Oncology

## 2019-10-22 NOTE — Telephone Encounter (Signed)
Scheduled appt per 4/6 sch message - pt is aware of appt date and time

## 2019-11-03 NOTE — Progress Notes (Signed)
Pharmacist Chemotherapy Monitoring - Follow Up Assessment    I verify that I have reviewed each item in the below checklist:  . Regimen for the patient is scheduled for the appropriate day and plan matches scheduled date. Marland Kitchen Appropriate non-routine labs are ordered dependent on drug ordered. . If applicable, additional medications reviewed and ordered per protocol based on lifetime cumulative doses and/or treatment regimen.   Plan for follow-up and/or issues identified: No . I-vent associated with next due treatment: No . MD and/or nursing notified: No  Romualdo Bolk [facility] 11/03/2019 11:36 AM

## 2019-11-04 DIAGNOSIS — Z96652 Presence of left artificial knee joint: Secondary | ICD-10-CM | POA: Diagnosis not present

## 2019-11-04 DIAGNOSIS — M25562 Pain in left knee: Secondary | ICD-10-CM | POA: Diagnosis not present

## 2019-11-09 ENCOUNTER — Inpatient Hospital Stay: Payer: Medicare HMO

## 2019-11-09 ENCOUNTER — Other Ambulatory Visit: Payer: Self-pay

## 2019-11-09 VITALS — BP 148/66 | HR 64 | Temp 98.0°F | Resp 18 | Wt 132.0 lb

## 2019-11-09 DIAGNOSIS — Z5112 Encounter for antineoplastic immunotherapy: Secondary | ICD-10-CM | POA: Diagnosis not present

## 2019-11-09 DIAGNOSIS — C541 Malignant neoplasm of endometrium: Secondary | ICD-10-CM

## 2019-11-09 DIAGNOSIS — Z7189 Other specified counseling: Secondary | ICD-10-CM

## 2019-11-09 DIAGNOSIS — C773 Secondary and unspecified malignant neoplasm of axilla and upper limb lymph nodes: Secondary | ICD-10-CM

## 2019-11-09 LAB — CBC WITH DIFFERENTIAL (CANCER CENTER ONLY)
Abs Immature Granulocytes: 0.04 10*3/uL (ref 0.00–0.07)
Basophils Absolute: 0.1 10*3/uL (ref 0.0–0.1)
Basophils Relative: 2 %
Eosinophils Absolute: 0.2 10*3/uL (ref 0.0–0.5)
Eosinophils Relative: 3 %
HCT: 29.3 % — ABNORMAL LOW (ref 36.0–46.0)
Hemoglobin: 10.1 g/dL — ABNORMAL LOW (ref 12.0–15.0)
Immature Granulocytes: 1 %
Lymphocytes Relative: 27 %
Lymphs Abs: 1.5 10*3/uL (ref 0.7–4.0)
MCH: 31.8 pg (ref 26.0–34.0)
MCHC: 34.5 g/dL (ref 30.0–36.0)
MCV: 92.1 fL (ref 80.0–100.0)
Monocytes Absolute: 0.6 10*3/uL (ref 0.1–1.0)
Monocytes Relative: 10 %
Neutro Abs: 3.1 10*3/uL (ref 1.7–7.7)
Neutrophils Relative %: 57 %
Platelet Count: 196 10*3/uL (ref 150–400)
RBC: 3.18 MIL/uL — ABNORMAL LOW (ref 3.87–5.11)
RDW: 11.9 % (ref 11.5–15.5)
WBC Count: 5.5 10*3/uL (ref 4.0–10.5)
nRBC: 0 % (ref 0.0–0.2)

## 2019-11-09 LAB — CMP (CANCER CENTER ONLY)
ALT: 16 U/L (ref 0–44)
AST: 21 U/L (ref 15–41)
Albumin: 3.7 g/dL (ref 3.5–5.0)
Alkaline Phosphatase: 65 U/L (ref 38–126)
Anion gap: 12 (ref 5–15)
BUN: 19 mg/dL (ref 8–23)
CO2: 26 mmol/L (ref 22–32)
Calcium: 8.7 mg/dL — ABNORMAL LOW (ref 8.9–10.3)
Chloride: 100 mmol/L (ref 98–111)
Creatinine: 1.1 mg/dL — ABNORMAL HIGH (ref 0.44–1.00)
GFR, Est AFR Am: 55 mL/min — ABNORMAL LOW (ref >60)
GFR, Estimated: 48 mL/min — ABNORMAL LOW (ref >60)
Glucose, Bld: 93 mg/dL (ref 70–99)
Potassium: 3.7 mmol/L (ref 3.5–5.1)
Sodium: 138 mmol/L (ref 135–145)
Total Bilirubin: 0.3 mg/dL (ref 0.3–1.2)
Total Protein: 6.4 g/dL — ABNORMAL LOW (ref 6.5–8.1)

## 2019-11-09 LAB — MAGNESIUM: Magnesium: 0.8 mg/dL — CL (ref 1.7–2.4)

## 2019-11-09 MED ORDER — DIPHENHYDRAMINE HCL 25 MG PO CAPS
ORAL_CAPSULE | ORAL | Status: AC
  Filled 2019-11-09: qty 1

## 2019-11-09 MED ORDER — SODIUM CHLORIDE 0.9 % IV SOLN
Freq: Once | INTRAVENOUS | Status: AC
  Filled 2019-11-09: qty 250

## 2019-11-09 MED ORDER — HEPARIN SOD (PORK) LOCK FLUSH 100 UNIT/ML IV SOLN
500.0000 [IU] | Freq: Once | INTRAVENOUS | Status: AC | PRN
  Administered 2019-11-09: 500 [IU]
  Filled 2019-11-09: qty 5

## 2019-11-09 MED ORDER — SODIUM CHLORIDE 0.9% FLUSH
10.0000 mL | INTRAVENOUS | Status: DC | PRN
  Administered 2019-11-09: 10 mL
  Filled 2019-11-09: qty 10

## 2019-11-09 MED ORDER — ACETAMINOPHEN 325 MG PO TABS
650.0000 mg | ORAL_TABLET | Freq: Once | ORAL | Status: AC
  Administered 2019-11-09: 650 mg via ORAL

## 2019-11-09 MED ORDER — SODIUM CHLORIDE 0.9 % IV SOLN
6.0000 g | Freq: Once | INTRAVENOUS | Status: AC
  Administered 2019-11-09: 6 g via INTRAVENOUS
  Filled 2019-11-09: qty 10

## 2019-11-09 MED ORDER — SODIUM CHLORIDE 0.9% FLUSH
10.0000 mL | Freq: Once | INTRAVENOUS | Status: AC
  Administered 2019-11-09: 10 mL
  Filled 2019-11-09: qty 10

## 2019-11-09 MED ORDER — DIPHENHYDRAMINE HCL 25 MG PO TABS
25.0000 mg | ORAL_TABLET | Freq: Once | ORAL | Status: AC
  Administered 2019-11-09: 25 mg via ORAL
  Filled 2019-11-09: qty 1

## 2019-11-09 MED ORDER — TRASTUZUMAB-DKST CHEMO 150 MG IV SOLR
6.0000 mg/kg | Freq: Once | INTRAVENOUS | Status: AC
  Administered 2019-11-09: 357 mg via INTRAVENOUS
  Filled 2019-11-09: qty 17

## 2019-11-09 MED ORDER — ACETAMINOPHEN 325 MG PO TABS
ORAL_TABLET | ORAL | Status: AC
  Filled 2019-11-09: qty 2

## 2019-11-09 NOTE — Patient Instructions (Signed)
[facility] Discharge Instructions for Patients Receiving Chemotherapy  Today you received the following chemotherapy agents: trastuzumab (Ogivri).  To help prevent nausea and vomiting after your treatment, we encourage you to take your nausea medication as directed.   If you develop nausea and vomiting that is not controlled by your nausea medication, call the clinic.   BELOW ARE SYMPTOMS THAT SHOULD BE REPORTED IMMEDIATELY:  *FEVER GREATER THAN 100.5 F  *CHILLS WITH OR WITHOUT FEVER  NAUSEA AND VOMITING THAT IS NOT CONTROLLED WITH YOUR NAUSEA MEDICATION  *UNUSUAL SHORTNESS OF BREATH  *UNUSUAL BRUISING OR BLEEDING  TENDERNESS IN MOUTH AND THROAT WITH OR WITHOUT PRESENCE OF ULCERS  *URINARY PROBLEMS  *BOWEL PROBLEMS  UNUSUAL RASH Items with * indicate a potential emergency and should be followed up as soon as possible.  Feel free to call the clinic should you have any questions or concerns. The clinic phone number is [phone].  Please show the [facility] at check-in to the Emergency Department and triage nurse.  Hypomagnesemia Hypomagnesemia is a condition in which the level of magnesium in the blood is low. Magnesium is a mineral that is found in many foods. It is used in many different processes in the body. Hypomagnesemia can affect every organ in the body. In severe cases, it can cause life-threatening problems. What are the causes? This condition may be caused by:  Not getting enough magnesium in your diet.  Malnutrition.  Problems with absorbing magnesium from the intestines.  Dehydration.  Alcohol abuse.  Vomiting.  Severe or chronic diarrhea.  Some medicines, including medicines that make you urinate more (diuretics).  Certain diseases, such as kidney disease, diabetes, celiac disease, and overactive thyroid. What are the signs or symptoms? Symptoms of this condition include:  Loss of appetite.  Nausea and  vomiting.  Involuntary shaking or trembling of a body part (tremor).  Muscle weakness.  Tingling in the arms and legs.  Sudden tightening of muscles (muscle spasms).  Confusion.  Psychiatric issues, such as depression, irritability, or psychosis.  A feeling of fluttering of the heart.  Seizures. These symptoms are more severe if magnesium levels drop suddenly. How is this diagnosed? This condition may be diagnosed based on:  Your symptoms and medical history.  A physical exam.  Blood and urine tests. How is this treated? Treatment depends on the cause and the severity of the condition. It may be treated with:  A magnesium supplement. This can be taken in pill form. If the condition is severe, magnesium is usually given through an IV.  Changes to your diet. You may be directed to eat foods that have a lot of magnesium, such as green leafy vegetables, peas, beans, and nuts.  Stopping any intake of alcohol. Follow these instructions at home:      Make sure that your diet includes foods with magnesium. Foods that have a lot of magnesium in them include: ? Green leafy vegetables, such as spinach and broccoli. ? Beans and peas. ? Nuts and seeds, such as almonds and sunflower seeds. ? Whole grains, such as whole grain bread and fortified cereals.  Take magnesium supplements if your health care provider tells you to do that. Take them as directed.  Take over-the-counter and prescription medicines only as told by your health care provider.  Have your magnesium levels monitored as told by your health care provider.  When you are active, drink fluids that contain electrolytes.  Avoid drinking alcohol.  Keep all follow-up  visits as told by your health care provider. This is important. Contact a health care provider if:  You get worse instead of better.  Your symptoms return. Get help right away if you:  Develop severe muscle weakness.  Have trouble  breathing.  Feel that your heart is racing. Summary  Hypomagnesemia is a condition in which the level of magnesium in the blood is low.  Hypomagnesemia can affect every organ in the body.  Treatment may include eating more foods that contain magnesium, taking magnesium supplements, and not drinking alcohol.  Have your magnesium levels monitored as told by your health care provider. This information is not intended to replace advice given to you by your health care provider. Make sure you discuss any questions you have with your health care provider. Document Revised: 06/13/2017 Document Reviewed: 06/02/2017 Elsevier Patient Education  2020 [employer].

## 2019-11-10 DIAGNOSIS — L570 Actinic keratosis: Secondary | ICD-10-CM | POA: Diagnosis not present

## 2019-11-10 DIAGNOSIS — M19071 Primary osteoarthritis, right ankle and foot: Secondary | ICD-10-CM | POA: Diagnosis not present

## 2019-11-10 DIAGNOSIS — M79671 Pain in right foot: Secondary | ICD-10-CM | POA: Diagnosis not present

## 2019-11-10 DIAGNOSIS — L308 Other specified dermatitis: Secondary | ICD-10-CM | POA: Diagnosis not present

## 2019-11-10 DIAGNOSIS — Z85828 Personal history of other malignant neoplasm of skin: Secondary | ICD-10-CM | POA: Diagnosis not present

## 2019-11-11 DIAGNOSIS — I2510 Atherosclerotic heart disease of native coronary artery without angina pectoris: Secondary | ICD-10-CM | POA: Diagnosis not present

## 2019-11-11 DIAGNOSIS — M1990 Unspecified osteoarthritis, unspecified site: Secondary | ICD-10-CM | POA: Diagnosis not present

## 2019-11-11 DIAGNOSIS — E039 Hypothyroidism, unspecified: Secondary | ICD-10-CM | POA: Diagnosis not present

## 2019-11-11 DIAGNOSIS — M810 Age-related osteoporosis without current pathological fracture: Secondary | ICD-10-CM | POA: Diagnosis not present

## 2019-11-11 DIAGNOSIS — G8929 Other chronic pain: Secondary | ICD-10-CM | POA: Diagnosis not present

## 2019-11-11 DIAGNOSIS — I10 Essential (primary) hypertension: Secondary | ICD-10-CM | POA: Diagnosis not present

## 2019-11-11 DIAGNOSIS — B009 Herpesviral infection, unspecified: Secondary | ICD-10-CM | POA: Diagnosis not present

## 2019-11-11 DIAGNOSIS — K219 Gastro-esophageal reflux disease without esophagitis: Secondary | ICD-10-CM | POA: Diagnosis not present

## 2019-11-11 DIAGNOSIS — C541 Malignant neoplasm of endometrium: Secondary | ICD-10-CM | POA: Diagnosis not present

## 2019-11-11 DIAGNOSIS — E785 Hyperlipidemia, unspecified: Secondary | ICD-10-CM | POA: Diagnosis not present

## 2019-11-24 NOTE — Progress Notes (Signed)

## 2019-11-30 ENCOUNTER — Inpatient Hospital Stay: Payer: Medicare HMO | Attending: Gynecologic Oncology

## 2019-11-30 ENCOUNTER — Other Ambulatory Visit: Payer: Self-pay

## 2019-11-30 ENCOUNTER — Encounter: Payer: Self-pay | Admitting: Hematology and Oncology

## 2019-11-30 ENCOUNTER — Inpatient Hospital Stay: Payer: Medicare HMO | Admitting: Hematology and Oncology

## 2019-11-30 ENCOUNTER — Inpatient Hospital Stay: Payer: Medicare HMO

## 2019-11-30 VITALS — BP 156/56 | HR 72 | Temp 98.3°F | Resp 18 | Ht 62.2 in | Wt 130.8 lb

## 2019-11-30 VITALS — BP 459/64 | HR 64

## 2019-11-30 DIAGNOSIS — C541 Malignant neoplasm of endometrium: Secondary | ICD-10-CM | POA: Insufficient documentation

## 2019-11-30 DIAGNOSIS — Z9079 Acquired absence of other genital organ(s): Secondary | ICD-10-CM | POA: Diagnosis not present

## 2019-11-30 DIAGNOSIS — R918 Other nonspecific abnormal finding of lung field: Secondary | ICD-10-CM | POA: Insufficient documentation

## 2019-11-30 DIAGNOSIS — N183 Chronic kidney disease, stage 3 unspecified: Secondary | ICD-10-CM | POA: Insufficient documentation

## 2019-11-30 DIAGNOSIS — Z170 Estrogen receptor positive status [ER+]: Secondary | ICD-10-CM | POA: Diagnosis not present

## 2019-11-30 DIAGNOSIS — Z853 Personal history of malignant neoplasm of breast: Secondary | ICD-10-CM | POA: Insufficient documentation

## 2019-11-30 DIAGNOSIS — Z79899 Other long term (current) drug therapy: Secondary | ICD-10-CM | POA: Insufficient documentation

## 2019-11-30 DIAGNOSIS — Z90710 Acquired absence of both cervix and uterus: Secondary | ICD-10-CM | POA: Diagnosis not present

## 2019-11-30 DIAGNOSIS — Z5112 Encounter for antineoplastic immunotherapy: Secondary | ICD-10-CM | POA: Diagnosis present

## 2019-11-30 DIAGNOSIS — Z7982 Long term (current) use of aspirin: Secondary | ICD-10-CM | POA: Diagnosis not present

## 2019-11-30 DIAGNOSIS — Z7189 Other specified counseling: Secondary | ICD-10-CM

## 2019-11-30 DIAGNOSIS — Z9221 Personal history of antineoplastic chemotherapy: Secondary | ICD-10-CM | POA: Insufficient documentation

## 2019-11-30 DIAGNOSIS — Z90722 Acquired absence of ovaries, bilateral: Secondary | ICD-10-CM | POA: Diagnosis not present

## 2019-11-30 DIAGNOSIS — C773 Secondary and unspecified malignant neoplasm of axilla and upper limb lymph nodes: Secondary | ICD-10-CM

## 2019-11-30 DIAGNOSIS — C779 Secondary and unspecified malignant neoplasm of lymph node, unspecified: Secondary | ICD-10-CM | POA: Insufficient documentation

## 2019-11-30 DIAGNOSIS — Z5111 Encounter for antineoplastic chemotherapy: Secondary | ICD-10-CM | POA: Diagnosis not present

## 2019-11-30 LAB — CBC WITH DIFFERENTIAL (CANCER CENTER ONLY)
Abs Immature Granulocytes: 0.04 10*3/uL (ref 0.00–0.07)
Basophils Absolute: 0.1 10*3/uL (ref 0.0–0.1)
Basophils Relative: 1 %
Eosinophils Absolute: 0.2 10*3/uL (ref 0.0–0.5)
Eosinophils Relative: 3 %
HCT: 33.4 % — ABNORMAL LOW (ref 36.0–46.0)
Hemoglobin: 11.5 g/dL — ABNORMAL LOW (ref 12.0–15.0)
Immature Granulocytes: 1 %
Lymphocytes Relative: 17 %
Lymphs Abs: 1.2 10*3/uL (ref 0.7–4.0)
MCH: 31.8 pg (ref 26.0–34.0)
MCHC: 34.4 g/dL (ref 30.0–36.0)
MCV: 92.3 fL (ref 80.0–100.0)
Monocytes Absolute: 0.7 10*3/uL (ref 0.1–1.0)
Monocytes Relative: 9 %
Neutro Abs: 5.1 10*3/uL (ref 1.7–7.7)
Neutrophils Relative %: 69 %
Platelet Count: 234 10*3/uL (ref 150–400)
RBC: 3.62 MIL/uL — ABNORMAL LOW (ref 3.87–5.11)
RDW: 12 % (ref 11.5–15.5)
WBC Count: 7.3 10*3/uL (ref 4.0–10.5)
nRBC: 0 % (ref 0.0–0.2)

## 2019-11-30 LAB — CMP (CANCER CENTER ONLY)
ALT: 14 U/L (ref 0–44)
AST: 20 U/L (ref 15–41)
Albumin: 4 g/dL (ref 3.5–5.0)
Alkaline Phosphatase: 74 U/L (ref 38–126)
Anion gap: 13 (ref 5–15)
BUN: 27 mg/dL — ABNORMAL HIGH (ref 8–23)
CO2: 26 mmol/L (ref 22–32)
Calcium: 9.2 mg/dL (ref 8.9–10.3)
Chloride: 100 mmol/L (ref 98–111)
Creatinine: 1.31 mg/dL — ABNORMAL HIGH (ref 0.44–1.00)
GFR, Est AFR Am: 45 mL/min — ABNORMAL LOW (ref >60)
GFR, Estimated: 39 mL/min — ABNORMAL LOW (ref >60)
Glucose, Bld: 105 mg/dL — ABNORMAL HIGH (ref 70–99)
Potassium: 3.8 mmol/L (ref 3.5–5.1)
Sodium: 139 mmol/L (ref 135–145)
Total Bilirubin: 0.4 mg/dL (ref 0.3–1.2)
Total Protein: 7.2 g/dL (ref 6.5–8.1)

## 2019-11-30 LAB — MAGNESIUM: Magnesium: 0.9 mg/dL — CL (ref 1.7–2.4)

## 2019-11-30 MED ORDER — SODIUM CHLORIDE 0.9% FLUSH
10.0000 mL | INTRAVENOUS | Status: DC | PRN
  Administered 2019-11-30: 10 mL
  Filled 2019-11-30: qty 10

## 2019-11-30 MED ORDER — SODIUM CHLORIDE 0.9 % IV SOLN
6.0000 g | Freq: Once | INTRAVENOUS | Status: AC
  Administered 2019-11-30: 6 g via INTRAVENOUS
  Filled 2019-11-30: qty 12

## 2019-11-30 MED ORDER — ACETAMINOPHEN 325 MG PO TABS
650.0000 mg | ORAL_TABLET | Freq: Once | ORAL | Status: AC
  Administered 2019-11-30: 650 mg via ORAL

## 2019-11-30 MED ORDER — DIPHENHYDRAMINE HCL 25 MG PO TABS
25.0000 mg | ORAL_TABLET | Freq: Once | ORAL | Status: AC
  Administered 2019-11-30: 25 mg via ORAL
  Filled 2019-11-30: qty 1

## 2019-11-30 MED ORDER — ACETAMINOPHEN 325 MG PO TABS
ORAL_TABLET | ORAL | Status: AC
  Filled 2019-11-30: qty 2

## 2019-11-30 MED ORDER — HEPARIN SOD (PORK) LOCK FLUSH 100 UNIT/ML IV SOLN
500.0000 [IU] | Freq: Once | INTRAVENOUS | Status: AC | PRN
  Administered 2019-11-30: 500 [IU]
  Filled 2019-11-30: qty 5

## 2019-11-30 MED ORDER — TRASTUZUMAB-DKST CHEMO 150 MG IV SOLR
6.0000 mg/kg | Freq: Once | INTRAVENOUS | Status: AC
  Administered 2019-11-30: 357 mg via INTRAVENOUS
  Filled 2019-11-30: qty 17

## 2019-11-30 MED ORDER — SODIUM CHLORIDE 0.9 % IV SOLN
Freq: Once | INTRAVENOUS | Status: AC
  Filled 2019-11-30: qty 250

## 2019-11-30 MED ORDER — DIPHENHYDRAMINE HCL 25 MG PO CAPS
ORAL_CAPSULE | ORAL | Status: AC
  Filled 2019-11-30: qty 1

## 2019-11-30 MED ORDER — SODIUM CHLORIDE 0.9% FLUSH
10.0000 mL | Freq: Once | INTRAVENOUS | Status: AC
  Administered 2019-11-30: 10 mL
  Filled 2019-11-30: qty 10

## 2019-11-30 NOTE — Assessment & Plan Note (Signed)
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

## 2019-11-30 NOTE — Progress Notes (Signed)
Reported Magnesium 0.9 to Dr. Alvy Bimler.

## 2019-11-30 NOTE — Progress Notes (Signed)
Per MD Alvy Bimler, ok to proceed with Trastuzumab and IV magnesium today with lab results

## 2019-11-30 NOTE — Progress Notes (Signed)
[name] OFFICE PROGRESS NOTE  Patient Care Team: Burnard Bunting, [name]  ASSESSMENT & PLAN:  Endometrial cancer [facility]) She is doing well, no side-effects from treatment Exam is benign We will follow-up with repeat CT scan in a few months, around September of this year In the meantime, we will continue [name] treatment indefinitely   Hypomagnesemia The cause of the low magnesium is unknown [name] is not known to cause low magnesium We discussed nephrology referral and she agreed, appointment pending She will receive IV magnesium replacement today  CKD (chronic kidney disease), stage III ([place]) Renal function is stable. Monitor closely We discussed nephrology consult and she agreed to proceed   Orders Placed This Encounter  Procedures  . ECHOCARDIOGRAM COMPLETE    Standing Status:   Future    Standing Expiration Date:   03/01/2021    Order Specific Question:   Where should this test be performed    Answer:   [facility]    Order Specific Question:   Perflutren DEFINITY (image enhancing agent) should be administered unless hypersensitivity or allergy exist    Answer:   Administer Perflutren    Order Specific Question:   Reason for exam-Echo    Answer:   Chemotherapy evaluation  v87.41 / v58.11    All questions were answered. The patient knows to call the clinic with any problems, questions or concerns. The total time spent in the appointment was 20 minutes encounter with patients including review of chart and various tests results, discussions about plan of care and coordination of care plan   Vicki Lark, MD 11/30/2019 7:13 PM  INTERVAL HISTORY: Please see below for problem oriented charting.  She returns for treatment and follow-up She is doing well She had excellent time with family in [place] No recent signs or symptoms of congestive heart failure She has chronic mild bilateral lower extremity edema, stable No  recent new lymphadenopathy She is scheduled to see nephrologist in July  SUMMARY OF ONCOLOGIC HISTORY: Oncology History Overview Note  Vicki Cervantes  has a remote history of left  breast cancer at age 21 but received BRCA testing approximately 5 years ago which was negative. Her cancer was treated with surgery but no radiation or chemotherapy.   Serous endometrial cancer, MSI stable ER positive, PR neg, Her2/neu 3+      Endometrial cancer ([place])  08/29/2016 Pathology Results   Endometrium, biopsy - HIGH GRADE ENDOMETRIAL CARCINOMA, SEE COMMENT. Microscopic Comment The sections show multiple fragments of adenocarcinoma displaying glandular and papillary patterns associated with high grade cytomorphology characterized by nuclear pleomorphism, prominent nucleoli and brisk mitosis. Immunohistochemical stains show that the tumor cells are positive for vimentin, p16, p53 with increased Ki-67 expression. Estrogen and progesterone receptor stains show patchy weak positivity. No significant positivity is seen with CEA. The findings are consistent with high grade endometrial carcinoma and the overall morphology and phenotypic features favor serous carcinoma.   08/29/2016 Initial Diagnosis   She presented with postmenopausal bleeding   10/03/2016 Imaging   CT C/A/P 09/2016 IMPRESSION: 1. Thickening of the endometrial canal up to 19 mm in fundus, presumably corresponding to the patient's reported endometrial carcinoma. 2. Multiple tiny pulmonary nodules scattered throughout the lungs bilaterally measuring 4 mm or less in size. Nodules of this size are typically considered statistically likely benign. In the setting of known primary malignancy, metastatic disease to the lungs is not excluded, but is not strongly favored on today's examination.  Attention on followup studies is recommended to ensure the stability or resolution of these nodules. 3. Subcentimeter low-attenuation lesion in the central aspect of  segment 8 of the liver is too small to characterize. This is statistically likely a tiny cyst, but warrants attention on follow-up studies to exclude the possibility of a solitary hepatic metastasis. 4. 1.5 x 1.5 x 1.7 cm well-circumscribed lesion in the proximal stomach. This is of uncertain etiology and significance, and could represent a benign gastric polyp, however, further evaluation with nonemergent endoscopy is suggested in the near future for further evaluation. 5. **An incidental finding of potential clinical significance has been found. 1.1 x 1.6 cm thyroid nodule in the inferior aspect of the right lobe of the thyroid gland. Follow-up evaluation with nonemergent thyroid ultrasound is recommended in the near future to better evaluate this finding. This recommendation follows ACR consensuss guidelines: Managing Incidental Thyroid Nodules Detected on Imaging: White Paper of the ACR Incidental Thyroid Findings Committee. J Am Coll Radiol 2015;12(2):143-150.** 6. Aortic atherosclerosis, in addition to left anterior descending coronary artery disease   10/22/2016 Surgery   Robotic assisted total hysterectomy, BSO and bilateral pelvic lymphadenectomy  Final pathology revealed a 3cm polyp containing serous carcinoma but with no myometrial invasion, no LVSI and negative nodes.  Stage IA Uterine serous cancer   10/22/2016 Pathology Results   1. Lymph nodes, regional resection, right pelvic - SIX BENIGN LYMPH NODES (0/6). 2. Lymph nodes, regional resection, left pelvic - SEVEN BENIGN LYMPH NODES (0/7). 3. Uterus +/- tubes/ovaries, neoplastic, with right ovary and fallopian tube ENDOMYOMETRIUM - SEROUS CARCINOMA ARISING WITHIN AN ENDOMETRIAL POLYP - NO MYOMETRIAL INVASION IDENTIFIED - ADENOMYOSIS - LEIOMYOMA (1 CM) - SEE ONCOLOGY TABLE AND COMMENT CERVIX - CARCINOMA FOCALLY INVOLVES ENDOCERVICAL GLANDS - NABOTHIAN CYSTS RIGHT ADNEXA - BENIGN OVARY AND FALLOPIAN TUBE - NO CARCINOMA  IDENTIFIED 4. Cul-de-sac biopsy - MESOTHELIAL HYPERPLASIA Microscopic Comment 3. ONCOLOGY TABLE-UTERUS, CARCINOMA OR CARCINOSARCOMA Specimen: Uterus, right fallopian tube and ovary Procedure: Total hysterectomy and right salpingo-oophorectomy Lymph node sampling performed: Bilateral pelvic regional resection Specimen integrity: Intact Maximum tumor size: 3 cm (polyp) Histologic type: Serous carcinoma Grade: High grade Myometrial invasion: Not identified Cervical stromal involvement: No, focal endocervical gland involvement Extent of involvement of other organs: Not identified Lymph - vascular invasion: Not identified Peritoneal washings: N/A Lymph nodes: Examined: 0 Sentinel 13 Non-sentinel 13 Total Lymph nodes with metastasis: 0 Isolated tumor cells (< 0.2 mm): 0 Micrometastasis: (> 0.2 mm and < 2.0 mm): 0 Macrometastasis: (> 2.0 mm): 0 Extracapsular extension: N/A Pelvic lymph nodes: 0 involved of 13 lymph nodes. Para-aortic lymph nodes: No para-aortic nodes submitted TNM code: pT1a, pNX FIGO Stage (based on pathologic findings, needs clinical correlation): IA Comment: Immunohistochemistry for cytokeratin AE1/AE3 is performed on all of the lymph nodes (parts 1 & 2) and no metastatic carcinoma is identified.   07/04/2017 Genetic Testing   The patient had genetic testing due to a personal history of breast and uterine cancer, and a family history of stomach cancer.  The Multi-Cancer Panel was ordered. The Multi-Cancer Panel offered by [facility] includes sequencing and/or deletion duplication testing of the following 83 genes: ALK, APC, ATM, AXIN2,BAP1,  BARD1, BLM, BMPR1A, BRCA1, BRCA2, BRIP1, CASR, CDC73, CDH1, CDK4, CDKN1B, CDKN1C, CDKN2A (p14ARF), CDKN2A (p16INK4a), CEBPA, CHEK2, CTNNA1, DICER1, DIS3L2, EGFR (c.2369C>T, p.Thr790Met variant only), EPCAM (Deletion/duplication testing only), FH, FLCN, GATA2, GPC3, GREM1 (Promoter region deletion/duplication testing only), HOXB13  (c.251G>A, p.Gly84Glu), HRAS, KIT, MAX, MEN1, MET, MITF (c.952G>A, p.Glu318Lys variant only),  MLH1, MSH2, MSH3, MSH6, MUTYH, NBN, NF1, NF2, NTHL1, PALB2, PDGFRA, PHOX2B, PMS2, POLD1, POLE, POT1, PRKAR1A, PTCH1, PTEN, RAD50, RAD51C, RAD51D, RB1, RECQL4, RET, RUNX1, SDHAF2, SDHA (sequence changes only), SDHB, SDHC, SDHD, SMAD4, SMARCA4, SMARCB1, SMARCE1, STK11, SUFU, TERC, TERT, TMEM127, TP53, TSC1, TSC2, VHL, WRN and WT1.   Results: No pathogenic variants were identified.  A variant of uncertain significance in the gene APC was identified.  c.791A>G (p.Gln264Arg).  The date of this test report is 07/04/2017.     Genetic Testing   Patient has genetic testing done for MSI. 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    12/03/2017 Imaging   CT Chest/Abd/Pelvis to follow pulmonary nodule and gastric mass IMPRESSION: 1. Stable CT of the chest. Small pulmonary nodules are unchanged when compared with previous exam. 2. No new findings identified. 3. Subcentimeter low-attenuation lesions within the liver are remain too small to characterize but are stable from prior exam. 4. Persistent indeterminate low-attenuation structure within the proximal stomach is unchanged measuring 1.4 cm. Correlation with direct visualization is advised   06/30/2018 Imaging   CT CHEST Lungs/Pleura: Stable scattered sub-cm pulmonary nodules are again seen bilaterally and are stable compared to previous studies. No new or enlarging pulmonary nodules or masses identified. No evidence of pulmonary infiltrate or pleural effusion.   08/19/2018 Echocardiogram   [place] is done in [place]: EF 60-65%. Mild impaired relaxation. (report scanned)   09/17/2018 Relapse/Recurrence   Presented with c/o vagina discharge.  Lesion noted at the left vaginal apex 4m lesion removed Path c/w high grade serous cancer   09/17/2018 Pathology Results   Vagina, biopsy, left apex - HIGH GRADE SEROUS CARCINOMA.   09/28/2018  PET scan   1. Two hypermetabolic axial lymph nodes. Unusual site for metastatic endometrial carcinoma however the activity is more intense than typically seen in reactive adenopathy. Suggest ultrasound-guided percutaneous biopsy of the larger RIGHT axial lymph node 2. No evidence of local recurrence at the vaginal cuff.  3. No metastatic adenopathy in the abdomen or pelvis. 4. Stable small pulmonary nodules   10/09/2018 Pathology Results   Lymph node, needle/core biopsy, right axilla - METASTATIC CARCINOMA, SEE COMMENT. Microscopic Comment The carcinoma appears high grade. Immunohistochemistry is positive for cytokeratin 7, PAX-8, and ER. Cytokeratin 20, CDX-2, PR, and GATA-3 are negative. The findings along with the history are consistent with a gynecologic primary.    10/09/2018 Procedure   Ultrasound-guided core biopsies of a right axillary lymph node.   10/14/2018 Cancer Staging   Staging form: Corpus Uteri - Carcinoma and Carcinosarcoma, AJCC 8th Edition - Clinical: Stage IVB (cT1, cN0, pM1) - Signed by GHeath Lark MD on 10/14/2018   10/19/2018 Imaging   Placement of single lumen port a cath via right internal jugular vein. The catheter tip lies at the cavo-atrial junction. A power injectable port a cath was placed and is ready for immediate use.    10/21/2018 -  Chemotherapy   The patient had carboplatin and taxol for treatment   11/11/2018 -  Chemotherapy   The patient had trastuzumab ([name]) 450 mg in sodium chloride 0.9 % 250 mL chemo infusion, 483 mg, Intravenous,  Once, 6 of 6 cycles Administration: 450 mg (11/11/2018), 378 mg (12/02/2018), 378 mg (02/03/2019), 378 mg (12/23/2018), 378 mg (01/13/2019), 378 mg (02/24/2019) trastuzumab-dkst (OGIVRI) 378 mg in sodium chloride 0.9 % 250 mL chemo infusion, 6 mg/kg = 378 mg (100 % of original dose 6 mg/kg), Intravenous,  Once, 13 of 15  cycles Dose modification: 6 mg/kg (original dose 6 mg/kg, Cycle 7, Reason: Other (see  comments)) Administration: 378 mg (03/24/2019), 378 mg (04/14/2019), 378 mg (05/05/2019), 378 mg (05/26/2019), 378 mg (06/16/2019), 378 mg (07/06/2019), 357 mg (07/28/2019), 357 mg (08/18/2019), 357 mg (09/07/2019), 357 mg (09/28/2019), 357 mg (10/19/2019), 357 mg (11/09/2019), 357 mg (11/30/2019)  for chemotherapy treatment.    12/21/2018 PET scan   1. Interval resolution of hypermetabolic right axillary lymph nodes. No metabolic findings highly suspicious for recurrent metastatic disease. 2. New mild hypermetabolism within a borderline prominent portacaval lymph node, nonspecific. While a reactive node is favored, a lymph node metastasis cannot be entirely excluded. Suggest attention to this lymph node on follow-up PET-CT in 3-6 months. 3. Nonspecific new hypermetabolism at the ileocecal valve, more likely physiologic given absence of CT correlate. 4. Scattered subcentimeter pulmonary nodules are all stable and below PET resolution, more likely benign, continued CT surveillance advised. 5. Chronic findings include: Aortic Atherosclerosis (ICD10-I70.0). Marked diffuse colonic diverticulosis. Coronary atherosclerosis.    12/28/2018 Echocardiogram   1. The left ventricle has normal systolic function with an ejection fraction of 60-65%. The cavity size was normal. Left ventricular diastolic Doppler parameters are consistent with impaired relaxation.  2. GLS recorded as -10.7 but LV appears hyperdynamic and tracking of endocardium appears poor.  3. The right ventricle has normal systolic function. The cavity was normal. There is no increase in right ventricular wall thickness.  4. Mild thickening of the mitral valve leaflet.  5. The aortic valve was not well visualized. Mild thickening of the aortic valve. Aortic valve regurgitation is trivial by color flow Doppler.   03/23/2019 Imaging   PET 1. No findings of hypermetabolic residual/recurrent or metastatic disease. 2. Similar low-level hypermetabolism within  normal sized portocaval and right inguinal nodes, favored to be reactive. 3. Ongoing stability of small bilateral pulmonary nodules, favored to be benign. Below PET resolution. 4. Coronary artery atherosclerosis. Aortic Atherosclerosis   03/26/2019 Echocardiogram   1. The left ventricle has normal systolic function, with an ejection fraction of 55-60%. The cavity size was normal. Left ventricular diastolic Doppler parameters are consistent with impaired relaxation.  2. The right ventricle has normal systolic function. The cavity was normal.  3. The mitral valve is abnormal. Mild thickening of the mitral valve leaflet. There is mild mitral annular calcification present.  4. The tricuspid valve is grossly normal.  5. The aortic valve is tricuspid. Mild calcification of the aortic valve. No stenosis of the aortic valve.  6. The aorta is normal unless otherwise noted.  7. Normal LV systolic function; grade 1 diastolic dysfunction; UYQ-03.4%.   06/30/2019 Imaging   Chest Impression:   1. No evidence thoracic metastasis. 2. Stable small benign-appearing pulmonary nodules   Abdomen / Pelvis Impression:   1. No evidence of metastatic disease in the abdomen pelvis. 2.  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 3.  Aortic Atherosclerosis (ICD10-I70.0).   06/30/2019 Echocardiogram    1. Left ventricular ejection fraction, by visual estimation, is 60 to 65%. The left ventricle has normal function. There is no left ventricular hypertrophy.  2. Left ventricular diastolic parameters are consistent with Grade I diastolic dysfunction (impaired relaxation).  3. The left ventricle has no regional wall motion abnormalities.  4. Global right ventricle has normal systolic function.The right ventricular size is normal. No increase in right ventricular wall thickness.  5. Left atrial size was mild-moderately dilated.  6. Right atrial size was normal.  7. The mitral valve  is normal in structure.  Trivial mitral valve regurgitation.  8. The tricuspid valve is normal in structure. Tricuspid valve regurgitation is trivial.  9. The aortic valve is normal in structure. Aortic valve regurgitation is trivial. No evidence of aortic valve sclerosis or stenosis. 10. The pulmonic valve was grossly normal. Pulmonic valve regurgitation is not visualized. 11. Mildly elevated pulmonary artery systolic pressure. 12. The inferior vena cava is normal in size with greater than 50% respiratory variability, suggesting right atrial pressure of 3 mmHg. 13. The average left ventricular global longitudinal strain is -12.5 %. GLS underestimated due to poor endocardial tracking.     09/27/2019 Imaging   1. Multiple small, nonspecific pulmonary nodules are again noted. The previously noted lung nodules are unchanged in size from previous exam. There is a new lung nodule within the medial right lower lobe which is nonspecific measure 4 mm. Attention at follow-up imaging is recommended. 2. No evidence of metastatic disease within the abdomen or pelvis. 3. Coronary artery calcifications.  The 4.  Aortic Atherosclerosis (ICD10-I70.0).   09/30/2019 Echocardiogram    1. Left ventricular ejection fraction, by estimation, is 60 to 65%. The left ventricle has normal function. The left ventricle has no regional wall motion abnormalities. Left ventricular diastolic parameters are consistent with Grade I diastolic dysfunction (impaired relaxation).  2. Right ventricular systolic function is normal. The right ventricular size is normal. There is normal pulmonary artery systolic pressure. The estimated right ventricular systolic pressure is 16.1 mmHg.  3. The mitral valve is normal in structure. No evidence of mitral valve regurgitation. No evidence of mitral stenosis.  4. The aortic valve is normal in structure. Aortic valve regurgitation is trivial. No aortic stenosis is present.  5. The inferior vena cava is normal in size with  greater than 50% respiratory variability, suggesting right atrial pressure of 3 mmHg.     Metastasis to lymph nodes ([place])  10/14/2018 Initial Diagnosis   Metastasis to lymph nodes ([place])   10/21/2018 - 03/16/2019 Chemotherapy   The patient had palonosetron (ALOXI) injection 0.25 mg, 0.25 mg, Intravenous,  Once, 7 of 7 cycles Administration: 0.25 mg (10/21/2018), 0.25 mg (11/11/2018), 0.25 mg (12/02/2018), 0.25 mg (12/23/2018), 0.25 mg (01/13/2019), 0.25 mg (02/03/2019), 0.25 mg (02/24/2019) CARBOplatin (PARAPLATIN) 310 mg in sodium chloride 0.9 % 250 mL chemo infusion, 310 mg, Intravenous,  Once, 7 of 7 cycles Dose modification: 300 mg (original dose 311.5 mg, Cycle 4, Reason: Dose not tolerated) Administration: 310 mg (10/21/2018), 300 mg (11/11/2018), 300 mg (12/02/2018), 300 mg (12/23/2018), 300 mg (01/13/2019), 300 mg (02/03/2019), 300 mg (02/24/2019) PACLitaxel (TAXOL) 222 mg in sodium chloride 0.9 % 250 mL chemo infusion (> 37m/m2), 135 mg/m2 = 222 mg, Intravenous,  Once, 7 of 7 cycles Administration: 222 mg (10/21/2018), 222 mg (11/11/2018), 222 mg (12/02/2018), 222 mg (12/23/2018), 222 mg (01/13/2019), 222 mg (02/03/2019), 222 mg (02/24/2019) fosaprepitant (EMEND) 150 mg, dexamethasone (DECADRON) 12 mg in sodium chloride 0.9 % 145 mL IVPB, , Intravenous,  Once, 7 of 7 cycles Administration:  (10/21/2018),  (11/11/2018),  (12/02/2018),  (12/23/2018),  (01/13/2019),  (02/03/2019),  (02/24/2019)  for chemotherapy treatment.      REVIEW OF SYSTEMS:   Constitutional: Denies fevers, chills or abnormal weight loss Eyes: Denies blurriness of vision Ears, nose, mouth, throat, and face: Denies mucositis or sore throat Respiratory: Denies cough, dyspnea or wheezes Cardiovascular: Denies palpitation, chest discomfort or lower extremity swelling Gastrointestinal:  Denies nausea, heartburn or change in bowel habits Skin: Denies abnormal skin rashes  Lymphatics: Denies new lymphadenopathy or easy bruising Neurological:Denies numbness,  tingling or new weaknesses Behavioral/Psych: Mood is stable, no new changes  All other systems were reviewed with the patient and are negative.  I have reviewed the past medical history, past surgical history, social history and family history with the patient and they are unchanged from previous note.  ALLERGIES:  has No Known Allergies.  MEDICATIONS:  Current Outpatient Medications  Medication Sig Dispense Refill  . acetaminophen (TYLENOL) 500 MG tablet Take 1,000 mg by mouth every 6 (six) hours as needed.    Marland Kitchen aspirin EC 81 MG tablet Take 81 mg by mouth daily.    Marland Kitchen atorvastatin (LIPITOR) 20 MG tablet Take 20 mg by mouth every evening.     . Calcium Carbonate-Vitamin D (CALCIUM-VITAMIN D) 500-200 MG-UNIT per tablet Take 1 tablet by mouth daily.    . citalopram (CELEXA) 20 MG tablet Take 20 mg by mouth at bedtime.     . gabapentin (NEURONTIN) 600 MG tablet Take 600 mg by mouth at bedtime as needed.    Marland Kitchen levothyroxine (SYNTHROID, LEVOTHROID) 100 MCG tablet Take 100 mcg by mouth daily before breakfast.     . lidocaine-prilocaine (EMLA) cream Apply to affected area once 30 g 3  . losartan-hydrochlorothiazide (HYZAAR) 100-25 MG tablet     . magnesium oxide (MAG-OX) 400 (241.3 Mg) MG tablet Take 1 tablet (400 mg total) by mouth daily. 30 tablet 11  . metoprolol succinate (TOPROL-XL) 25 MG 24 hr tablet Take 25 mg by mouth daily.    . Multiple Vitamin (MULTIVITAMIN WITH MINERALS) TABS Take 1 tablet by mouth daily.    . ondansetron (ZOFRAN) 8 MG tablet Take 1 tablet (8 mg total) by mouth every 8 (eight) hours as needed. Start on the third day after chemotherapy. 30 tablet 1  . pantoprazole (PROTONIX) 40 MG tablet Take 40 mg by mouth every morning.     . valACYclovir (VALTREX) 1000 MG tablet valacyclovir 1 gram tablet     No current facility-administered medications for this visit.   Facility-Administered Medications Ordered in Other Visits  Medication Dose Route Frequency Provider Last Rate  Last Admin  . sodium chloride flush (NS) 0.9 % injection 10 mL  10 mL Intracatheter PRN Alvy Bimler, Aalijah Lanphere, MD   10 mL at 11/30/19 1600    PHYSICAL EXAMINATION: ECOG PERFORMANCE STATUS: 0 - Asymptomatic  Vitals:   11/30/19 1106  BP: (!) 156/56  Pulse: 72  Resp: 18  Temp: 98.3 F (36.8 C)  SpO2: 98%   Filed Weights   11/30/19 1106  Weight: 130 lb 12.8 oz (59.3 kg)    GENERAL:alert, no distress and comfortable SKIN: skin color, texture, turgor are normal, no rashes or significant lesions EYES: normal, Conjunctiva are pink and non-injected, sclera clear OROPHARYNX:no exudate, no erythema and lips, buccal mucosa, and tongue normal  NECK: supple, thyroid normal size, non-tender, without nodularity LYMPH:  no palpable lymphadenopathy in the cervical, axillary or inguinal LUNGS: clear to auscultation and percussion with normal breathing effort HEART: regular rate & rhythm and no murmurs and no lower extremity edema ABDOMEN:abdomen soft, non-tender and normal bowel sounds Musculoskeletal:no cyanosis of digits and no clubbing  NEURO: alert & oriented x 3 with fluent speech, no focal motor/sensory deficits  LABORATORY DATA:  I have reviewed the data as listed    Component Value Date/Time   NA 139 11/30/2019 1030   NA 139 02/13/2017 1512   K 3.8 11/30/2019 1030   K 4.2 02/13/2017 1512  CL 100 11/30/2019 1030   CO2 26 11/30/2019 1030   CO2 28 02/13/2017 1512   GLUCOSE 105 (H) 11/30/2019 1030   GLUCOSE 118 02/13/2017 1512   BUN 27 (H) 11/30/2019 1030   BUN 25.2 02/13/2017 1512   CREATININE 1.31 (H) 11/30/2019 1030   CREATININE 1.2 (H) 02/13/2017 1512   CALCIUM 9.2 11/30/2019 1030   CALCIUM 10.0 02/13/2017 1512   PROT 7.2 11/30/2019 1030   ALBUMIN 4.0 11/30/2019 1030   AST 20 11/30/2019 1030   ALT 14 11/30/2019 1030   ALKPHOS 74 11/30/2019 1030   BILITOT 0.4 11/30/2019 1030   GFRNONAA 39 (L) 11/30/2019 1030   GFRAA 45 (L) 11/30/2019 1030    No results found for: SPEP,  UPEP  Lab Results  Component Value Date   WBC 7.3 11/30/2019   NEUTROABS 5.1 11/30/2019   HGB 11.5 (L) 11/30/2019   HCT 33.4 (L) 11/30/2019   MCV 92.3 11/30/2019   PLT 234 11/30/2019      Chemistry      Component Value Date/Time   NA 139 11/30/2019 1030   NA 139 02/13/2017 1512   K 3.8 11/30/2019 1030   K 4.2 02/13/2017 1512   CL 100 11/30/2019 1030   CO2 26 11/30/2019 1030   CO2 28 02/13/2017 1512   BUN 27 (H) 11/30/2019 1030   BUN 25.2 02/13/2017 1512   CREATININE 1.31 (H) 11/30/2019 1030   CREATININE 1.2 (H) 02/13/2017 1512      Component Value Date/Time   CALCIUM 9.2 11/30/2019 1030   CALCIUM 10.0 02/13/2017 1512   ALKPHOS 74 11/30/2019 1030   AST 20 11/30/2019 1030   ALT 14 11/30/2019 1030   BILITOT 0.4 11/30/2019 1030

## 2019-11-30 NOTE — Patient Instructions (Signed)

## 2019-11-30 NOTE — Patient Instructions (Signed)
[facility] Discharge Instructions for Patients Receiving Chemotherapy  Today you received the following chemotherapy agents Trastuzumab  To help prevent nausea and vomiting after your treatment, we encourage you to take your nausea medication as directed.    If you develop nausea and vomiting that is not controlled by your nausea medication, call the clinic.   BELOW ARE SYMPTOMS THAT SHOULD BE REPORTED IMMEDIATELY:  *FEVER GREATER THAN 100.5 F  *CHILLS WITH OR WITHOUT FEVER  NAUSEA AND VOMITING THAT IS NOT CONTROLLED WITH YOUR NAUSEA MEDICATION  *UNUSUAL SHORTNESS OF BREATH  *UNUSUAL BRUISING OR BLEEDING  TENDERNESS IN MOUTH AND THROAT WITH OR WITHOUT PRESENCE OF ULCERS  *URINARY PROBLEMS  *BOWEL PROBLEMS  UNUSUAL RASH Items with * indicate a potential emergency and should be followed up as soon as possible.  Feel free to call the clinic should you have any questions or concerns. The clinic phone number is [phone].  Please show the CHEMO ALERT CARD at check-in to the Emergency Department and triage nurse.   Hypomagnesemia Hypomagnesemia is a condition in which the level of magnesium in the blood is low. Magnesium is a mineral that is found in many foods. It is used in many different processes in the body. Hypomagnesemia can affect every organ in the body. In severe cases, it can cause life-threatening problems. What are the causes? This condition may be caused by:  Not getting enough magnesium in your diet.  Malnutrition.  Problems with absorbing magnesium from the intestines.  Dehydration.  Alcohol abuse.  Vomiting.  Severe or chronic diarrhea.  Some medicines, including medicines that make you urinate more (diuretics).  Certain diseases, such as kidney disease, diabetes, celiac disease, and overactive thyroid. What are the signs or symptoms? Symptoms of this condition include:  Loss of appetite.  Nausea and vomiting.  Involuntary  shaking or trembling of a body part (tremor).  Muscle weakness.  Tingling in the arms and legs.  Sudden tightening of muscles (muscle spasms).  Confusion.  Psychiatric issues, such as depression, irritability, or psychosis.  A feeling of fluttering of the heart.  Seizures. These symptoms are more severe if magnesium levels drop suddenly. How is this diagnosed? This condition may be diagnosed based on:  Your symptoms and medical history.  A physical exam.  Blood and urine tests. How is this treated? Treatment depends on the cause and the severity of the condition. It may be treated with:  A magnesium supplement. This can be taken in pill form. If the condition is severe, magnesium is usually given through an IV.  Changes to your diet. You may be directed to eat foods that have a lot of magnesium, such as green leafy vegetables, peas, beans, and nuts.  Stopping any intake of alcohol. Follow these instructions at home:      Make sure that your diet includes foods with magnesium. Foods that have a lot of magnesium in them include: ? Green leafy vegetables, such as spinach and broccoli. ? Beans and peas. ? Nuts and seeds, such as almonds and sunflower seeds. ? Whole grains, such as whole grain bread and fortified cereals.  Take magnesium supplements if your health care provider tells you to do that. Take them as directed.  Take over-the-counter and prescription medicines only as told by your health care provider.  Have your magnesium levels monitored as told by your health care provider.  When you are active, drink fluids that contain electrolytes.  Avoid drinking alcohol.  Keep all   follow-up visits as told by your health care provider. This is important. Contact a health care provider if:  You get worse instead of better.  Your symptoms return. Get help right away if you:  Develop severe muscle weakness.  Have trouble breathing.  Feel that your heart is  racing. Summary  Hypomagnesemia is a condition in which the level of magnesium in the blood is low.  Hypomagnesemia can affect every organ in the body.  Treatment may include eating more foods that contain magnesium, taking magnesium supplements, and not drinking alcohol.  Have your magnesium levels monitored as told by your health care provider. This information is not intended to replace advice given to you by your health care provider. Make sure you discuss any questions you have with your health care provider. Document Revised: 06/13/2017 Document Reviewed: 06/02/2017 Elsevier Patient Education  2020 [employer].

## 2019-11-30 NOTE — Assessment & Plan Note (Signed)
The cause of the low magnesium is unknown [name] is not known to cause low magnesium We discussed nephrology referral and she agreed, appointment pending She will receive IV magnesium replacement today 

## 2019-11-30 NOTE — Assessment & Plan Note (Signed)
Renal function is stable. Monitor closely We discussed nephrology consult and she agreed to proceed

## 2019-12-01 ENCOUNTER — Telehealth: Payer: Self-pay | Admitting: Hematology and Oncology

## 2019-12-01 NOTE — Telephone Encounter (Signed)
Scheduled per 5/18 sch message. Pt aware of appts on 6/8. Noted to give pt updated calendar.

## 2019-12-14 ENCOUNTER — Telehealth: Payer: Self-pay

## 2019-12-14 ENCOUNTER — Telehealth: Payer: Self-pay | Admitting: Hematology and Oncology

## 2019-12-14 NOTE — Telephone Encounter (Signed)
Pt called to ask if she could have her infusion schedule changed on 12/21/19 to an earlier appt d/t grand daughter's graduation being this day in the afternoon. Per Charge RN, Lauren it is OK to change appt to infusion at 0900 12/21/19. Pt understands she needs to get here at 0800 for registration for 0830 lab, 0845 flush and 0900 infusion.Message was sent to scheduling to change this. Pt verbalized thanks and understanding.

## 2019-12-14 NOTE — Telephone Encounter (Signed)
Scheduled appt per 6/1 sch message - pt is aware of appts

## 2019-12-16 NOTE — Progress Notes (Signed)
Pharmacist Chemotherapy Monitoring - Follow Up Assessment    I verify that I have reviewed each item in the below checklist:  . Regimen for the patient is scheduled for the appropriate day and plan matches scheduled date. Marland Kitchen Appropriate non-routine labs are ordered dependent on drug ordered. . If applicable, additional medications reviewed and ordered per protocol based on lifetime cumulative doses and/or treatment regimen.   Plan for follow-up and/or issues identified: No . I-vent associated with next due treatment: No . MD and/or nursing notified: No  Vicki Cervantes D 12/16/2019 8:02 AM

## 2019-12-21 ENCOUNTER — Ambulatory Visit: Payer: Medicare HMO

## 2019-12-21 ENCOUNTER — Other Ambulatory Visit: Payer: Self-pay

## 2019-12-21 ENCOUNTER — Inpatient Hospital Stay: Payer: Medicare HMO

## 2019-12-21 ENCOUNTER — Other Ambulatory Visit: Payer: Medicare HMO

## 2019-12-21 ENCOUNTER — Inpatient Hospital Stay: Payer: Medicare HMO | Attending: Gynecologic Oncology

## 2019-12-21 ENCOUNTER — Other Ambulatory Visit: Payer: Self-pay | Admitting: Hematology and Oncology

## 2019-12-21 VITALS — BP 129/57 | HR 72 | Temp 97.8°F | Resp 17

## 2019-12-21 DIAGNOSIS — Z5112 Encounter for antineoplastic immunotherapy: Secondary | ICD-10-CM | POA: Diagnosis present

## 2019-12-21 DIAGNOSIS — C541 Malignant neoplasm of endometrium: Secondary | ICD-10-CM

## 2019-12-21 DIAGNOSIS — C779 Secondary and unspecified malignant neoplasm of lymph node, unspecified: Secondary | ICD-10-CM | POA: Diagnosis present

## 2019-12-21 LAB — CBC WITH DIFFERENTIAL (CANCER CENTER ONLY)
Abs Immature Granulocytes: 0.02 10*3/uL (ref 0.00–0.07)
Basophils Absolute: 0.1 10*3/uL (ref 0.0–0.1)
Basophils Relative: 1 %
Eosinophils Absolute: 0.2 10*3/uL (ref 0.0–0.5)
Eosinophils Relative: 5 %
HCT: 32.3 % — ABNORMAL LOW (ref 36.0–46.0)
Hemoglobin: 11.1 g/dL — ABNORMAL LOW (ref 12.0–15.0)
Immature Granulocytes: 0 %
Lymphocytes Relative: 30 %
Lymphs Abs: 1.5 10*3/uL (ref 0.7–4.0)
MCH: 31.3 pg (ref 26.0–34.0)
MCHC: 34.4 g/dL (ref 30.0–36.0)
MCV: 91 fL (ref 80.0–100.0)
Monocytes Absolute: 0.6 10*3/uL (ref 0.1–1.0)
Monocytes Relative: 12 %
Neutro Abs: 2.6 10*3/uL (ref 1.7–7.7)
Neutrophils Relative %: 52 %
Platelet Count: 191 10*3/uL (ref 150–400)
RBC: 3.55 MIL/uL — ABNORMAL LOW (ref 3.87–5.11)
RDW: 11.8 % (ref 11.5–15.5)
WBC Count: 5 10*3/uL (ref 4.0–10.5)
nRBC: 0 % (ref 0.0–0.2)

## 2019-12-21 LAB — CMP (CANCER CENTER ONLY)
ALT: 13 U/L (ref 0–44)
AST: 19 U/L (ref 15–41)
Albumin: 3.8 g/dL (ref 3.5–5.0)
Alkaline Phosphatase: 76 U/L (ref 38–126)
Anion gap: 14 (ref 5–15)
BUN: 23 mg/dL (ref 8–23)
CO2: 24 mmol/L (ref 22–32)
Calcium: 9.4 mg/dL (ref 8.9–10.3)
Chloride: 101 mmol/L (ref 98–111)
Creatinine: 1.37 mg/dL — ABNORMAL HIGH (ref 0.44–1.00)
GFR, Est AFR Am: 42 mL/min — ABNORMAL LOW (ref >60)
GFR, Estimated: 37 mL/min — ABNORMAL LOW (ref >60)
Glucose, Bld: 117 mg/dL — ABNORMAL HIGH (ref 70–99)
Potassium: 3.4 mmol/L — ABNORMAL LOW (ref 3.5–5.1)
Sodium: 139 mmol/L (ref 135–145)
Total Bilirubin: 0.4 mg/dL (ref 0.3–1.2)
Total Protein: 6.9 g/dL (ref 6.5–8.1)

## 2019-12-21 LAB — MAGNESIUM: Magnesium: 0.8 mg/dL — CL (ref 1.7–2.4)

## 2019-12-21 MED ORDER — SODIUM CHLORIDE 0.9 % IV SOLN
6.0000 g | Freq: Once | INTRAVENOUS | Status: DC
  Filled 2019-12-21: qty 12

## 2019-12-21 MED ORDER — DIPHENHYDRAMINE HCL 25 MG PO CAPS
ORAL_CAPSULE | ORAL | Status: AC
  Filled 2019-12-21: qty 1

## 2019-12-21 MED ORDER — SODIUM CHLORIDE 0.9% FLUSH
10.0000 mL | INTRAVENOUS | Status: DC | PRN
  Administered 2019-12-21: 10 mL
  Filled 2019-12-21: qty 10

## 2019-12-21 MED ORDER — SODIUM CHLORIDE 0.9 % IV SOLN
Freq: Once | INTRAVENOUS | Status: AC
  Filled 2019-12-21: qty 250

## 2019-12-21 MED ORDER — SODIUM CHLORIDE 0.9% FLUSH
10.0000 mL | Freq: Once | INTRAVENOUS | Status: AC
  Administered 2019-12-21: 10 mL
  Filled 2019-12-21: qty 10

## 2019-12-21 MED ORDER — DIPHENHYDRAMINE HCL 25 MG PO TABS
25.0000 mg | ORAL_TABLET | Freq: Once | ORAL | Status: AC
  Administered 2019-12-21: 25 mg via ORAL
  Filled 2019-12-21: qty 1

## 2019-12-21 MED ORDER — ACETAMINOPHEN 325 MG PO TABS
650.0000 mg | ORAL_TABLET | Freq: Once | ORAL | Status: AC
  Administered 2019-12-21: 650 mg via ORAL

## 2019-12-21 MED ORDER — ACETAMINOPHEN 325 MG PO TABS
ORAL_TABLET | ORAL | Status: AC
  Filled 2019-12-21: qty 2

## 2019-12-21 MED ORDER — TRASTUZUMAB-DKST CHEMO 150 MG IV SOLR
6.0000 mg/kg | Freq: Once | INTRAVENOUS | Status: AC
  Administered 2019-12-21: 357 mg via INTRAVENOUS
  Filled 2019-12-21: qty 17

## 2019-12-21 MED ORDER — SODIUM CHLORIDE 0.9 % IV SOLN
6.0000 g | Freq: Once | INTRAVENOUS | Status: AC
  Administered 2019-12-21: 6 g via INTRAVENOUS
  Filled 2019-12-21: qty 12

## 2019-12-21 MED ORDER — HEPARIN SOD (PORK) LOCK FLUSH 100 UNIT/ML IV SOLN
500.0000 [IU] | Freq: Once | INTRAVENOUS | Status: AC | PRN
  Administered 2019-12-21: 500 [IU]
  Filled 2019-12-21: qty 5

## 2019-12-21 NOTE — Patient Instructions (Signed)
[facility] Discharge Instructions for Patients Receiving Chemotherapy  Today you received the following chemotherapy agents Trastuzumab  To help prevent nausea and vomiting after your treatment, we encourage you to take your nausea medication as directed.    If you develop nausea and vomiting that is not controlled by your nausea medication, call the clinic.   BELOW ARE SYMPTOMS THAT SHOULD BE REPORTED IMMEDIATELY:  *FEVER GREATER THAN 100.5 F  *CHILLS WITH OR WITHOUT FEVER  NAUSEA AND VOMITING THAT IS NOT CONTROLLED WITH YOUR NAUSEA MEDICATION  *UNUSUAL SHORTNESS OF BREATH  *UNUSUAL BRUISING OR BLEEDING  TENDERNESS IN MOUTH AND THROAT WITH OR WITHOUT PRESENCE OF ULCERS  *URINARY PROBLEMS  *BOWEL PROBLEMS  UNUSUAL RASH Items with * indicate a potential emergency and should be followed up as soon as possible.  Feel free to call the clinic should you have any questions or concerns. The clinic phone number is [phone].  Please show the [place] at check-in to the Emergency Department and triage nurse.   Hypomagnesemia Hypomagnesemia is a condition in which the level of magnesium in the blood is low. Magnesium is a mineral that is found in many foods. It is used in many different processes in the body. Hypomagnesemia can affect every organ in the body. In severe cases, it can cause life-threatening problems. What are the causes? This condition may be caused by:  Not getting enough magnesium in your diet.  Malnutrition.  Problems with absorbing magnesium from the intestines.  Dehydration.  Alcohol abuse.  Vomiting.  Severe or chronic diarrhea.  Some medicines, including medicines that make you urinate more (diuretics).  Certain diseases, such as kidney disease, diabetes, celiac disease, and overactive thyroid. What are the signs or symptoms? Symptoms of this condition include:  Loss of appetite.  Nausea and vomiting.  Involuntary  shaking or trembling of a body part (tremor).  Muscle weakness.  Tingling in the arms and legs.  Sudden tightening of muscles (muscle spasms).  Confusion.  Psychiatric issues, such as depression, irritability, or psychosis.  A feeling of fluttering of the heart.  Seizures. These symptoms are more severe if magnesium levels drop suddenly. How is this diagnosed? This condition may be diagnosed based on:  Your symptoms and medical history.  A physical exam.  Blood and urine tests. How is this treated? Treatment depends on the cause and the severity of the condition. It may be treated with:  A magnesium supplement. This can be taken in pill form. If the condition is severe, magnesium is usually given through an IV.  Changes to your diet. You may be directed to eat foods that have a lot of magnesium, such as green leafy vegetables, peas, beans, and nuts.  Stopping any intake of alcohol. Follow these instructions at home:      Make sure that your diet includes foods with magnesium. Foods that have a lot of magnesium in them include: ? Green leafy vegetables, such as spinach and broccoli. ? Beans and peas. ? Nuts and seeds, such as almonds and sunflower seeds. ? Whole grains, such as whole grain bread and fortified cereals.  Take magnesium supplements if your health care provider tells you to do that. Take them as directed.  Take over-the-counter and prescription medicines only as told by your health care provider.  Have your magnesium levels monitored as told by your health care provider.  When you are active, drink fluids that contain electrolytes.  Avoid drinking alcohol.  Keep all  follow-up visits as told by your health care provider. This is important. Contact a health care provider if:  You get worse instead of better.  Your symptoms return. Get help right away if you:  Develop severe muscle weakness.  Have trouble breathing.  Feel that your heart is  racing. Summary  Hypomagnesemia is a condition in which the level of magnesium in the blood is low.  Hypomagnesemia can affect every organ in the body.  Treatment may include eating more foods that contain magnesium, taking magnesium supplements, and not drinking alcohol.  Have your magnesium levels monitored as told by your health care provider. This information is not intended to replace advice given to you by your health care provider. Make sure you discuss any questions you have with your health care provider. Document Revised: 06/13/2017 Document Reviewed: 06/02/2017 Elsevier Patient Education  2020 [employer].

## 2019-12-23 ENCOUNTER — Telehealth: Payer: Self-pay

## 2019-12-23 NOTE — Telephone Encounter (Signed)
TC from Pt stating she would like to see if she could change her appointment for Infusion because she has a family vacation scheduled starting June 27 and will be gone for a week. Discussed with Dr. Alvy Bimler who stated Pt. Can push her treatment back but will also have to reschedule her Echo. Left Pt a VM message to return the call to see what she wanted to do.

## 2019-12-24 ENCOUNTER — Telehealth: Payer: Self-pay

## 2019-12-24 DIAGNOSIS — C541 Malignant neoplasm of endometrium: Secondary | ICD-10-CM | POA: Diagnosis not present

## 2019-12-24 NOTE — Telephone Encounter (Signed)
She called and left a message regarding leaving on vacation on 6/27 for 1 week.  Rescheduled echo appt to 6/25 at 10 am, arrive at 0945. Sent scheduling message to move all 6/29 appts to 7/8 per Dr. Alvy Bimler.  Called back and left the above message. Ask her to call for questions.

## 2019-12-27 ENCOUNTER — Telehealth: Payer: Self-pay | Admitting: Hematology and Oncology

## 2019-12-27 NOTE — Telephone Encounter (Signed)
R/s per 6/11 sch message. Pt aware of appts r/s from 6/29 to 7/8.

## 2020-01-05 DIAGNOSIS — R69 Illness, unspecified: Secondary | ICD-10-CM | POA: Diagnosis not present

## 2020-01-06 DIAGNOSIS — Z85828 Personal history of other malignant neoplasm of skin: Secondary | ICD-10-CM | POA: Diagnosis not present

## 2020-01-06 DIAGNOSIS — L131 Subcorneal pustular dermatitis: Secondary | ICD-10-CM | POA: Diagnosis not present

## 2020-01-06 DIAGNOSIS — L570 Actinic keratosis: Secondary | ICD-10-CM | POA: Diagnosis not present

## 2020-01-07 ENCOUNTER — Ambulatory Visit (HOSPITAL_COMMUNITY)
Admission: RE | Admit: 2020-01-07 | Discharge: 2020-01-07 | Disposition: A | Discharge status: Home / Self Care | Payer: Medicare HMO | Source: Ambulatory Visit | Attending: Hematology and Oncology | Admitting: Hematology and Oncology

## 2020-01-07 ENCOUNTER — Other Ambulatory Visit: Payer: Self-pay

## 2020-01-07 DIAGNOSIS — E785 Hyperlipidemia, unspecified: Secondary | ICD-10-CM | POA: Diagnosis not present

## 2020-01-07 DIAGNOSIS — I10 Essential (primary) hypertension: Secondary | ICD-10-CM | POA: Insufficient documentation

## 2020-01-07 DIAGNOSIS — Z853 Personal history of malignant neoplasm of breast: Secondary | ICD-10-CM | POA: Insufficient documentation

## 2020-01-07 DIAGNOSIS — I351 Nonrheumatic aortic (valve) insufficiency: Secondary | ICD-10-CM | POA: Insufficient documentation

## 2020-01-07 DIAGNOSIS — C541 Malignant neoplasm of endometrium: Secondary | ICD-10-CM | POA: Insufficient documentation

## 2020-01-07 DIAGNOSIS — Z5111 Encounter for antineoplastic chemotherapy: Secondary | ICD-10-CM | POA: Diagnosis not present

## 2020-01-07 NOTE — Progress Notes (Signed)
  Echocardiogram 2D Echocardiogram has been performed.  Vicki Cervantes 01/07/2020, 11:12 AM

## 2020-01-10 ENCOUNTER — Other Ambulatory Visit (HOSPITAL_COMMUNITY): Payer: Medicare HMO

## 2020-01-11 ENCOUNTER — Ambulatory Visit: Payer: Medicare HMO | Admitting: Hematology and Oncology

## 2020-01-11 ENCOUNTER — Other Ambulatory Visit: Payer: Medicare HMO

## 2020-01-11 ENCOUNTER — Ambulatory Visit: Payer: Medicare HMO

## 2020-01-20 ENCOUNTER — Encounter: Payer: Self-pay | Admitting: Hematology and Oncology

## 2020-01-20 ENCOUNTER — Inpatient Hospital Stay: Payer: Medicare HMO

## 2020-01-20 ENCOUNTER — Telehealth: Payer: Self-pay | Admitting: Hematology and Oncology

## 2020-01-20 ENCOUNTER — Inpatient Hospital Stay: Payer: Medicare HMO | Admitting: Hematology and Oncology

## 2020-01-20 ENCOUNTER — Inpatient Hospital Stay: Payer: Medicare HMO | Attending: Gynecologic Oncology

## 2020-01-20 ENCOUNTER — Other Ambulatory Visit: Payer: Self-pay

## 2020-01-20 DIAGNOSIS — Z90710 Acquired absence of both cervix and uterus: Secondary | ICD-10-CM | POA: Diagnosis not present

## 2020-01-20 DIAGNOSIS — Z79899 Other long term (current) drug therapy: Secondary | ICD-10-CM | POA: Insufficient documentation

## 2020-01-20 DIAGNOSIS — Z853 Personal history of malignant neoplasm of breast: Secondary | ICD-10-CM | POA: Diagnosis not present

## 2020-01-20 DIAGNOSIS — D630 Anemia in neoplastic disease: Secondary | ICD-10-CM | POA: Diagnosis not present

## 2020-01-20 DIAGNOSIS — C541 Malignant neoplasm of endometrium: Secondary | ICD-10-CM

## 2020-01-20 DIAGNOSIS — Z7982 Long term (current) use of aspirin: Secondary | ICD-10-CM | POA: Insufficient documentation

## 2020-01-20 DIAGNOSIS — C779 Secondary and unspecified malignant neoplasm of lymph node, unspecified: Secondary | ICD-10-CM | POA: Insufficient documentation

## 2020-01-20 DIAGNOSIS — Z5112 Encounter for antineoplastic immunotherapy: Secondary | ICD-10-CM | POA: Diagnosis not present

## 2020-01-20 DIAGNOSIS — D631 Anemia in chronic kidney disease: Secondary | ICD-10-CM | POA: Insufficient documentation

## 2020-01-20 DIAGNOSIS — N761 Subacute and chronic vaginitis: Secondary | ICD-10-CM

## 2020-01-20 DIAGNOSIS — N760 Acute vaginitis: Secondary | ICD-10-CM | POA: Insufficient documentation

## 2020-01-20 DIAGNOSIS — N183 Chronic kidney disease, stage 3 unspecified: Secondary | ICD-10-CM | POA: Diagnosis not present

## 2020-01-20 LAB — COMPREHENSIVE METABOLIC PANEL
ALT: 19 U/L (ref 0–44)
AST: 24 U/L (ref 15–41)
Albumin: 3.8 g/dL (ref 3.5–5.0)
Alkaline Phosphatase: 73 U/L (ref 38–126)
Anion gap: 12 (ref 5–15)
BUN: 23 mg/dL (ref 8–23)
CO2: 25 mmol/L (ref 22–32)
Calcium: 8.9 mg/dL (ref 8.9–10.3)
Chloride: 102 mmol/L (ref 98–111)
Creatinine, Ser: 1.3 mg/dL — ABNORMAL HIGH (ref 0.44–1.00)
GFR calc Af Amer: 45 mL/min — ABNORMAL LOW (ref >60)
GFR calc non Af Amer: 39 mL/min — ABNORMAL LOW (ref >60)
Glucose, Bld: 102 mg/dL — ABNORMAL HIGH (ref 70–99)
Potassium: 3.8 mmol/L (ref 3.5–5.1)
Sodium: 139 mmol/L (ref 135–145)
Total Bilirubin: 0.4 mg/dL (ref 0.3–1.2)
Total Protein: 6.8 g/dL (ref 6.5–8.1)

## 2020-01-20 LAB — CBC WITH DIFFERENTIAL/PLATELET
Abs Immature Granulocytes: 0.02 10*3/uL (ref 0.00–0.07)
Basophils Absolute: 0.1 10*3/uL (ref 0.0–0.1)
Basophils Relative: 1 %
Eosinophils Absolute: 0.2 10*3/uL (ref 0.0–0.5)
Eosinophils Relative: 5 %
HCT: 31.6 % — ABNORMAL LOW (ref 36.0–46.0)
Hemoglobin: 10.8 g/dL — ABNORMAL LOW (ref 12.0–15.0)
Immature Granulocytes: 0 %
Lymphocytes Relative: 28 %
Lymphs Abs: 1.4 10*3/uL (ref 0.7–4.0)
MCH: 31.5 pg (ref 26.0–34.0)
MCHC: 34.2 g/dL (ref 30.0–36.0)
MCV: 92.1 fL (ref 80.0–100.0)
Monocytes Absolute: 0.6 10*3/uL (ref 0.1–1.0)
Monocytes Relative: 12 %
Neutro Abs: 2.7 10*3/uL (ref 1.7–7.7)
Neutrophils Relative %: 54 %
Platelets: 191 10*3/uL (ref 150–400)
RBC: 3.43 MIL/uL — ABNORMAL LOW (ref 3.87–5.11)
RDW: 12 % (ref 11.5–15.5)
WBC: 5 10*3/uL (ref 4.0–10.5)
nRBC: 0 % (ref 0.0–0.2)

## 2020-01-20 LAB — MAGNESIUM: Magnesium: 0.9 mg/dL — CL (ref 1.7–2.4)

## 2020-01-20 MED ORDER — DIPHENHYDRAMINE HCL 25 MG PO TABS
25.0000 mg | ORAL_TABLET | Freq: Once | ORAL | Status: AC
  Administered 2020-01-20: 25 mg via ORAL
  Filled 2020-01-20: qty 1

## 2020-01-20 MED ORDER — ACETAMINOPHEN 325 MG PO TABS
650.0000 mg | ORAL_TABLET | Freq: Once | ORAL | Status: AC
  Administered 2020-01-20: 650 mg via ORAL

## 2020-01-20 MED ORDER — SODIUM CHLORIDE 0.9% FLUSH
10.0000 mL | Freq: Once | INTRAVENOUS | Status: AC
  Administered 2020-01-20: 10 mL
  Filled 2020-01-20: qty 10

## 2020-01-20 MED ORDER — HEPARIN SOD (PORK) LOCK FLUSH 100 UNIT/ML IV SOLN
500.0000 [IU] | Freq: Once | INTRAVENOUS | Status: AC
  Administered 2020-01-20: 500 [IU]
  Filled 2020-01-20: qty 5

## 2020-01-20 MED ORDER — TRASTUZUMAB-DKST CHEMO 150 MG IV SOLR
6.0000 mg/kg | Freq: Once | INTRAVENOUS | Status: AC
  Administered 2020-01-20: 357 mg via INTRAVENOUS
  Filled 2020-01-20: qty 17

## 2020-01-20 MED ORDER — ESTRADIOL 0.1 MG/GM VA CREA
1.0000 | TOPICAL_CREAM | VAGINAL | 12 refills | Status: DC
Start: 2020-01-21 — End: 2021-04-24

## 2020-01-20 MED ORDER — SODIUM CHLORIDE 0.9 % IV SOLN
Freq: Once | INTRAVENOUS | Status: AC
  Filled 2020-01-20: qty 250

## 2020-01-20 MED ORDER — SODIUM CHLORIDE 0.9 % IV SOLN
6.0000 g | Freq: Once | INTRAVENOUS | Status: AC
  Administered 2020-01-20: 6 g via INTRAVENOUS
  Filled 2020-01-20: qty 12

## 2020-01-20 NOTE — Progress Notes (Signed)
[place] OFFICE PROGRESS NOTE  Patient Care Team: Burnard Bunting, [name]  ASSESSMENT & PLAN:  Endometrial cancer [facility]) She is doing well, no side-effects from treatment Exam is benign We will follow-up with repeat CT scan in a few months, around September of this year In the meantime, we will continue [name] treatment indefinitely Recent echocardiogram showed preserved ejection fraction  Hypomagnesemia The cause of the low magnesium is unknown [name] is not known to cause low magnesium We discussed nephrology referral and she agreed, appointment pending She will receive IV magnesium replacement today  CKD (chronic kidney disease), stage III ([place]) Renal function is stable. Monitor closely Nephrology consult is pending  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   All questions were answered. The patient knows to call the clinic with any problems, questions or concerns. The total time spent in the appointment was 20 minutes encounter with patients including review of chart and various tests results, discussions about plan of care and coordination of care plan   [name], MD 01/20/2020 10:45 AM  INTERVAL HISTORY: Please see below for problem oriented charting. She returns for treatment and follow-up She denies side effects from treatment Nephrology consult is still pending She is enjoying visits with her family No new lumps or bumps No recent infection, fever or chills No signs or symptoms of congestive heart failure SUMMARY OF ONCOLOGIC HISTORY: Oncology History Overview Note  ARDYTHE KLUTE  has a remote history of left  breast cancer at age 60 but received  BRCA testing approximately 5 years ago which was negative. Her cancer was treated with surgery but no radiation or chemotherapy.   Serous endometrial cancer, MSI stable ER positive, PR neg, Her2/neu 3+      Endometrial cancer ([place])  08/29/2016 Pathology Results   Endometrium, biopsy - HIGH GRADE ENDOMETRIAL CARCINOMA, SEE COMMENT. Microscopic Comment The sections show multiple fragments of adenocarcinoma displaying glandular and papillary patterns associated with high grade cytomorphology characterized by nuclear pleomorphism, prominent nucleoli and brisk mitosis. Immunohistochemical stains show that the tumor cells are positive for vimentin, p16, p53 with increased Ki-67 expression. Estrogen and progesterone receptor stains show patchy weak positivity. No significant positivity is seen with CEA. The findings are consistent with high grade endometrial carcinoma and the overall morphology and phenotypic features favor serous carcinoma.   08/29/2016 Initial Diagnosis   She presented with postmenopausal bleeding   10/03/2016 Imaging   CT C/A/P 09/2016 IMPRESSION: 1. Thickening of the endometrial canal up to 19 mm in fundus, presumably corresponding to the patient's reported endometrial carcinoma. 2. Multiple tiny pulmonary nodules scattered throughout the lungs bilaterally measuring 4 mm or less in size. Nodules of this size are typically considered statistically likely benign. In the setting of known primary malignancy, metastatic disease to the lungs is not excluded, but is not strongly favored on today's examination. Attention on followup studies is recommended to ensure the stability or resolution of these nodules. 3. Subcentimeter low-attenuation lesion in the central aspect of segment 8 of the liver is too small to characterize. This is statistically likely a tiny cyst, but warrants attention on follow-up studies to exclude the possibility of a solitary hepatic metastasis. 4. 1.5 x 1.5 x 1.7 cm  well-circumscribed lesion in the proximal stomach. This is of uncertain etiology and significance, and could represent a benign gastric polyp, however, further evaluation with nonemergent endoscopy is suggested in the near future for further evaluation. 5. **An incidental finding of potential clinical significance has been found. 1.1 x 1.6 cm thyroid nodule in the inferior aspect of the right lobe of the thyroid gland. Follow-up evaluation with nonemergent thyroid ultrasound is recommended in the near future to better evaluate this finding. This recommendation follows ACR consensuss guidelines: Managing Incidental Thyroid Nodules Detected on Imaging: White Paper of the ACR Incidental Thyroid Findings Committee. J Am Coll Radiol 2015;12(2):143-150.** 6. Aortic atherosclerosis, in addition to left anterior descending coronary artery disease   10/22/2016 Surgery   Robotic assisted total hysterectomy, BSO and bilateral pelvic lymphadenectomy  Final pathology revealed a 3cm polyp containing serous carcinoma but with no myometrial invasion, no LVSI and negative nodes.  Stage IA Uterine serous cancer   10/22/2016 Pathology Results   1. Lymph nodes, regional resection, right pelvic - SIX BENIGN LYMPH NODES (0/6). 2. Lymph nodes, regional resection, left pelvic - SEVEN BENIGN LYMPH NODES (0/7). 3. Uterus +/- tubes/ovaries, neoplastic, with right ovary and fallopian tube ENDOMYOMETRIUM - SEROUS CARCINOMA ARISING WITHIN AN ENDOMETRIAL POLYP - NO MYOMETRIAL INVASION IDENTIFIED - ADENOMYOSIS - LEIOMYOMA (1 CM) - SEE ONCOLOGY TABLE AND COMMENT CERVIX - CARCINOMA FOCALLY INVOLVES ENDOCERVICAL GLANDS - NABOTHIAN CYSTS RIGHT ADNEXA - BENIGN OVARY AND FALLOPIAN TUBE - NO CARCINOMA IDENTIFIED 4. Cul-de-sac biopsy - MESOTHELIAL HYPERPLASIA Microscopic Comment 3. ONCOLOGY TABLE-UTERUS, CARCINOMA OR CARCINOSARCOMA Specimen: Uterus, right fallopian tube and ovary Procedure: Total hysterectomy and right  salpingo-oophorectomy Lymph node sampling performed: Bilateral pelvic regional resection Specimen integrity: Intact Maximum tumor size: 3 cm (polyp) Histologic type: Serous carcinoma Grade: High grade Myometrial invasion: Not identified Cervical stromal involvement: No, focal endocervical gland involvement Extent of involvement of other organs: Not identified Lymph - vascular invasion: Not identified Peritoneal washings: N/A Lymph nodes: Examined: 0 Sentinel 13 Non-sentinel 13 Total Lymph nodes with metastasis: 0 Isolated tumor cells (< 0.2 mm): 0 Micrometastasis: (> 0.2 mm and < 2.0 mm): 0 Macrometastasis: (> 2.0 mm): 0 Extracapsular extension: N/A Pelvic lymph nodes: 0 involved of 13 lymph nodes. Para-aortic lymph nodes: No para-aortic nodes submitted TNM code: pT1a, pNX FIGO Stage (based on pathologic findings, needs clinical correlation): IA Comment: Immunohistochemistry for cytokeratin AE1/AE3 is performed on all of the lymph nodes (parts 1 & 2) and no metastatic carcinoma is identified.   07/04/2017 Genetic Testing   The patient had genetic testing due to a personal history of breast and uterine cancer, and a family history of stomach cancer.  The Multi-Cancer Panel was ordered. The Multi-Cancer Panel offered by [facility] includes sequencing and/or deletion duplication testing of the following 83 genes: ALK, APC, ATM, AXIN2,BAP1,  BARD1, BLM, BMPR1A, BRCA1, BRCA2, BRIP1, CASR, CDC73, CDH1, CDK4, CDKN1B, CDKN1C, CDKN2A (p14ARF), CDKN2A (p16INK4a), CEBPA, CHEK2, CTNNA1, DICER1, DIS3L2, EGFR (c.2369C>T, p.Thr790Met variant only), EPCAM (Deletion/duplication testing only), FH, FLCN, GATA2, GPC3, GREM1 (Promoter region deletion/duplication testing only), HOXB13 (c.251G>A, p.Gly84Glu), HRAS, KIT, MAX, MEN1, MET, MITF (c.952G>A, p.Glu318Lys variant only), MLH1, MSH2, MSH3, MSH6, MUTYH, NBN, NF1, NF2, NTHL1, PALB2, PDGFRA, PHOX2B, PMS2, POLD1, POLE, POT1, PRKAR1A, PTCH1, PTEN, RAD50, RAD51C,  RAD51D, RB1, RECQL4, RET, RUNX1, SDHAF2, SDHA (sequence changes only), SDHB, SDHC, SDHD, SMAD4, SMARCA4, SMARCB1, SMARCE1, STK11, SUFU, TERC, TERT, TMEM127, TP53, TSC1, TSC2, VHL, WRN and WT1.   Results: No pathogenic variants were identified.  A variant of uncertain significance  in the gene APC was identified.  c.791A>G (p.Gln264Arg).  The date of this test report is 07/04/2017.     Genetic Testing   Patient has genetic testing done for MSI. 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    12/03/2017 Imaging   CT Chest/Abd/Pelvis to follow pulmonary nodule and gastric mass IMPRESSION: 1. Stable CT of the chest. Small pulmonary nodules are unchanged when compared with previous exam. 2. No new findings identified. 3. Subcentimeter low-attenuation lesions within the liver are remain too small to characterize but are stable from prior exam. 4. Persistent indeterminate low-attenuation structure within the proximal stomach is unchanged measuring 1.4 cm. Correlation with direct visualization is advised   06/30/2018 Imaging   CT CHEST Lungs/Pleura: Stable scattered sub-cm pulmonary nodules are again seen bilaterally and are stable compared to previous studies. No new or enlarging pulmonary nodules or masses identified. No evidence of pulmonary infiltrate or pleural effusion.   08/19/2018 Echocardiogram   E[place] is done in [place]: EF 60-65%. Mild impaired relaxation. (report scanned)   09/17/2018 Relapse/Recurrence   Presented with c/o vagina discharge.  Lesion noted at the left vaginal apex 44m lesion removed Path c/w high grade serous cancer   09/17/2018 Pathology Results   Vagina, biopsy, left apex - HIGH GRADE SEROUS CARCINOMA.   09/28/2018 PET scan   1. Two hypermetabolic axial lymph nodes. Unusual site for metastatic endometrial carcinoma however the activity is more intense than typically seen in reactive adenopathy. Suggest ultrasound-guided percutaneous biopsy of  the larger RIGHT axial lymph node 2. No evidence of local recurrence at the vaginal cuff.  3. No metastatic adenopathy in the abdomen or pelvis. 4. Stable small pulmonary nodules   10/09/2018 Pathology Results   Lymph node, needle/core biopsy, right axilla - METASTATIC CARCINOMA, SEE COMMENT. Microscopic Comment The carcinoma appears high grade. Immunohistochemistry is positive for cytokeratin 7, PAX-8, and ER. Cytokeratin 20, CDX-2, PR, and GATA-3 are negative. The findings along with the history are consistent with a gynecologic primary.    10/09/2018 Procedure   Ultrasound-guided core biopsies of a right axillary lymph node.   10/14/2018 Cancer Staging   Staging form: Corpus Uteri - Carcinoma and Carcinosarcoma, AJCC 8th Edition - Clinical: Stage IVB (cT1, cN0, pM1) - Signed by GHeath Lark MD on 10/14/2018   10/19/2018 Imaging   Placement of single lumen port a cath via right internal jugular vein. The catheter tip lies at the cavo-atrial junction. A power injectable port a cath was placed and is ready for immediate use.    10/21/2018 -  Chemotherapy   The patient had carboplatin and taxol for treatment   11/11/2018 -  Chemotherapy   The patient had trastuzumab ([name]) 450 mg in sodium chloride 0.9 % 250 mL chemo infusion, 483 mg, Intravenous,  Once, 6 of 6 cycles Administration: 450 mg (11/11/2018), 378 mg (12/02/2018), 378 mg (02/03/2019), 378 mg (12/23/2018), 378 mg (01/13/2019), 378 mg (02/24/2019) trastuzumab-dkst (OGIVRI) 378 mg in sodium chloride 0.9 % 250 mL chemo infusion, 6 mg/kg = 378 mg (100 % of original dose 6 mg/kg), Intravenous,  Once, 14 of 17 cycles Dose modification: 6 mg/kg (original dose 6 mg/kg, Cycle 7, Reason: Other (see comments)) Administration: 378 mg (03/24/2019), 378 mg (04/14/2019), 378 mg (05/05/2019), 378 mg (05/26/2019), 378 mg (06/16/2019), 378 mg (07/06/2019), 357 mg (07/28/2019), 357 mg (08/18/2019), 357 mg (09/07/2019), 357 mg (09/28/2019), 357 mg (10/19/2019), 357 mg  (11/09/2019), 357 mg (11/30/2019), 357 mg (12/21/2019)  for chemotherapy treatment.  12/21/2018 PET scan   1. Interval resolution of hypermetabolic right axillary lymph nodes. No metabolic findings highly suspicious for recurrent metastatic disease. 2. New mild hypermetabolism within a borderline prominent portacaval lymph node, nonspecific. While a reactive node is favored, a lymph node metastasis cannot be entirely excluded. Suggest attention to this lymph node on follow-up PET-CT in 3-6 months. 3. Nonspecific new hypermetabolism at the ileocecal valve, more likely physiologic given absence of CT correlate. 4. Scattered subcentimeter pulmonary nodules are all stable and below PET resolution, more likely benign, continued CT surveillance advised. 5. Chronic findings include: Aortic Atherosclerosis (ICD10-I70.0). Marked diffuse colonic diverticulosis. Coronary atherosclerosis.    12/28/2018 Echocardiogram   1. The left ventricle has normal systolic function with an ejection fraction of 60-65%. The cavity size was normal. Left ventricular diastolic Doppler parameters are consistent with impaired relaxation.  2. GLS recorded as -10.7 but LV appears hyperdynamic and tracking of endocardium appears poor.  3. The right ventricle has normal systolic function. The cavity was normal. There is no increase in right ventricular wall thickness.  4. Mild thickening of the mitral valve leaflet.  5. The aortic valve was not well visualized. Mild thickening of the aortic valve. Aortic valve regurgitation is trivial by color flow Doppler.   03/23/2019 Imaging   PET 1. No findings of hypermetabolic residual/recurrent or metastatic disease. 2. Similar low-level hypermetabolism within normal sized portocaval and right inguinal nodes, favored to be reactive. 3. Ongoing stability of small bilateral pulmonary nodules, favored to be benign. Below PET resolution. 4. Coronary artery atherosclerosis. Aortic  Atherosclerosis   03/26/2019 Echocardiogram   1. The left ventricle has normal systolic function, with an ejection fraction of 55-60%. The cavity size was normal. Left ventricular diastolic Doppler parameters are consistent with impaired relaxation.  2. The right ventricle has normal systolic function. The cavity was normal.  3. The mitral valve is abnormal. Mild thickening of the mitral valve leaflet. There is mild mitral annular calcification present.  4. The tricuspid valve is grossly normal.  5. The aortic valve is tricuspid. Mild calcification of the aortic valve. No stenosis of the aortic valve.  6. The aorta is normal unless otherwise noted.  7. Normal LV systolic function; grade 1 diastolic dysfunction; YME-15.8%.   06/30/2019 Imaging   Chest Impression:   1. No evidence thoracic metastasis. 2. Stable small benign-appearing pulmonary nodules   Abdomen / Pelvis Impression:   1. No evidence of metastatic disease in the abdomen pelvis. 2.  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 3.  Aortic Atherosclerosis (ICD10-I70.0).   06/30/2019 Echocardiogram    1. Left ventricular ejection fraction, by visual estimation, is 60 to 65%. The left ventricle has normal function. There is no left ventricular hypertrophy.  2. Left ventricular diastolic parameters are consistent with Grade I diastolic dysfunction (impaired relaxation).  3. The left ventricle has no regional wall motion abnormalities.  4. Global right ventricle has normal systolic function.The right ventricular size is normal. No increase in right ventricular wall thickness.  5. Left atrial size was mild-moderately dilated.  6. Right atrial size was normal.  7. The mitral valve is normal in structure. Trivial mitral valve regurgitation.  8. The tricuspid valve is normal in structure. Tricuspid valve regurgitation is trivial.  9. The aortic valve is normal in structure. Aortic valve regurgitation is trivial. No evidence  of aortic valve sclerosis or stenosis. 10. The pulmonic valve was grossly normal. Pulmonic valve regurgitation is not visualized. 11. Mildly elevated pulmonary artery  systolic pressure. 12. The inferior vena cava is normal in size with greater than 50% respiratory variability, suggesting right atrial pressure of 3 mmHg. 13. The average left ventricular global longitudinal strain is -12.5 %. GLS underestimated due to poor endocardial tracking.     09/27/2019 Imaging   1. Multiple small, nonspecific pulmonary nodules are again noted. The previously noted lung nodules are unchanged in size from previous exam. There is a new lung nodule within the medial right lower lobe which is nonspecific measure 4 mm. Attention at follow-up imaging is recommended. 2. No evidence of metastatic disease within the abdomen or pelvis. 3. Coronary artery calcifications.  The 4.  Aortic Atherosclerosis (ICD10-I70.0).   09/30/2019 Echocardiogram    1. Left ventricular ejection fraction, by estimation, is 60 to 65%. The left ventricle has normal function. The left ventricle has no regional wall motion abnormalities. Left ventricular diastolic parameters are consistent with Grade I diastolic dysfunction (impaired relaxation).  2. Right ventricular systolic function is normal. The right ventricular size is normal. There is normal pulmonary artery systolic pressure. The estimated right ventricular systolic pressure is 97.5 mmHg.  3. The mitral valve is normal in structure. No evidence of mitral valve regurgitation. No evidence of mitral stenosis.  4. The aortic valve is normal in structure. Aortic valve regurgitation is trivial. No aortic stenosis is present.  5. The inferior vena cava is normal in size with greater than 50% respiratory variability, suggesting right atrial pressure of 3 mmHg.     01/07/2020 Echocardiogram    1. Normal LV function; grade 1 diastolic dysfunction; mild AI; GLS -21%.  2. Left ventricular  ejection fraction, by estimation, is 55 to 60%. The left ventricle has normal function. The left ventricle has no regional wall motion abnormalities. Left ventricular diastolic parameters are consistent with Grade I diastolic dysfunction (impaired relaxation).  3. Right ventricular systolic function is normal. The right ventricular size is normal.  4. The mitral valve is normal in structure. Trivial mitral valve regurgitation. No evidence of mitral stenosis.  5. The aortic valve is tricuspid. Aortic valve regurgitation is mild. Mild aortic valve sclerosis is present, with no evidence of aortic valve stenosis.  6. The inferior vena cava is normal in size with greater than 50% respiratory variability, suggesting right atrial pressure of 3 mmHg.     Metastasis to lymph nodes ([place])  10/14/2018 Initial Diagnosis   Metastasis to lymph nodes ([place])   10/21/2018 - 03/16/2019 Chemotherapy   The patient had palonosetron (ALOXI) injection 0.25 mg, 0.25 mg, Intravenous,  Once, 7 of 7 cycles Administration: 0.25 mg (10/21/2018), 0.25 mg (11/11/2018), 0.25 mg (12/02/2018), 0.25 mg (12/23/2018), 0.25 mg (01/13/2019), 0.25 mg (02/03/2019), 0.25 mg (02/24/2019) CARBOplatin (PARAPLATIN) 310 mg in sodium chloride 0.9 % 250 mL chemo infusion, 310 mg, Intravenous,  Once, 7 of 7 cycles Dose modification: 300 mg (original dose 311.5 mg, Cycle 4, Reason: Dose not tolerated) Administration: 310 mg (10/21/2018), 300 mg (11/11/2018), 300 mg (12/02/2018), 300 mg (12/23/2018), 300 mg (01/13/2019), 300 mg (02/03/2019), 300 mg (02/24/2019) PACLitaxel (TAXOL) 222 mg in sodium chloride 0.9 % 250 mL chemo infusion (> 24m/m2), 135 mg/m2 = 222 mg, Intravenous,  Once, 7 of 7 cycles Administration: 222 mg (10/21/2018), 222 mg (11/11/2018), 222 mg (12/02/2018), 222 mg (12/23/2018), 222 mg (01/13/2019), 222 mg (02/03/2019), 222 mg (02/24/2019) fosaprepitant (EMEND) 150 mg, dexamethasone (DECADRON) 12 mg in sodium chloride 0.9 % 145 mL IVPB, , Intravenous,  Once, 7 of 7  cycles Administration:  (10/21/2018),  (  11/11/2018),  (12/02/2018),  (12/23/2018),  (01/13/2019),  (02/03/2019),  (02/24/2019)  for chemotherapy treatment.      REVIEW OF SYSTEMS:   Constitutional: Denies fevers, chills or abnormal weight loss Eyes: Denies blurriness of vision Ears, nose, mouth, throat, and face: Denies mucositis or sore throat Respiratory: Denies cough, dyspnea or wheezes Cardiovascular: Denies palpitation, chest discomfort or lower extremity swelling Gastrointestinal:  Denies nausea, heartburn or change in bowel habits Skin: Denies abnormal skin rashes Lymphatics: Denies new lymphadenopathy or easy bruising Neurological:Denies numbness, tingling or new weaknesses Behavioral/Psych: Mood is stable, no new changes  All other systems were reviewed with the patient and are negative.  I have reviewed the past medical history, past surgical history, social history and family history with the patient and they are unchanged from previous note.  ALLERGIES:  has No Known Allergies.  MEDICATIONS:  Current Outpatient Medications  Medication Sig Dispense Refill  . acetaminophen (TYLENOL) 500 MG tablet Take 1,000 mg by mouth every 6 (six) hours as needed.    Marland Kitchen aspirin EC 81 MG tablet Take 81 mg by mouth daily.    Marland Kitchen atorvastatin (LIPITOR) 20 MG tablet Take 20 mg by mouth every evening.     . Calcium Carbonate-Vitamin D (CALCIUM-VITAMIN D) 500-200 MG-UNIT per tablet Take 1 tablet by mouth daily.    . citalopram (CELEXA) 20 MG tablet Take 20 mg by mouth at bedtime.     Derrill Memo ON 01/21/2020] estradiol (ESTRACE VAGINAL) 0.1 MG/GM vaginal cream Place 1 Applicatorful vaginally 3 (three) times a week. 42.5 g 12  . gabapentin (NEURONTIN) 600 MG tablet Take 600 mg by mouth at bedtime as needed.    Marland Kitchen levothyroxine (SYNTHROID, LEVOTHROID) 100 MCG tablet Take 100 mcg by mouth daily before breakfast.     . lidocaine-prilocaine (EMLA) cream Apply to affected area once 30 g 3  .  losartan-hydrochlorothiazide (HYZAAR) 100-25 MG tablet     . magnesium oxide (MAG-OX) 400 (241.3 Mg) MG tablet Take 1 tablet (400 mg total) by mouth daily. 30 tablet 11  . metoprolol succinate (TOPROL-XL) 25 MG 24 hr tablet Take 25 mg by mouth daily.    . Multiple Vitamin (MULTIVITAMIN WITH MINERALS) TABS Take 1 tablet by mouth daily.    . ondansetron (ZOFRAN) 8 MG tablet Take 1 tablet (8 mg total) by mouth every 8 (eight) hours as needed. Start on the third day after chemotherapy. 30 tablet 1  . pantoprazole (PROTONIX) 40 MG tablet Take 40 mg by mouth every morning.     . valACYclovir (VALTREX) 1000 MG tablet valacyclovir 1 gram tablet     No current facility-administered medications for this visit.   Facility-Administered Medications Ordered in Other Visits  Medication Dose Route Frequency Provider Last Rate Last Admin  . heparin lock flush 100 unit/mL  500 Units Intracatheter Once Yarelin Reichardt, MD      . magnesium sulfate 6 g in sodium chloride 0.9 % 500 mL  6 g Intravenous Once Marvella Jenning, MD      . sodium chloride flush (NS) 0.9 % injection 10 mL  10 mL Intracatheter Once Alvy Bimler, Shamaria Kavan, MD        PHYSICAL EXAMINATION: ECOG PERFORMANCE STATUS: 0 - Asymptomatic  Vitals:   01/20/20 0947  BP: 136/70  Pulse: 60  Resp: 18  Temp: 98.1 F (36.7 C)  SpO2: 98%   Filed Weights   01/20/20 0947  Weight: 132 lb 12.8 oz (60.2 kg)    GENERAL:alert, no distress and comfortable SKIN: skin  color, texture, turgor are normal, no rashes or significant lesions EYES: normal, Conjunctiva are pink and non-injected, sclera clear OROPHARYNX:no exudate, no erythema and lips, buccal mucosa, and tongue normal  NECK: supple, thyroid normal size, non-tender, without nodularity LYMPH:  no palpable lymphadenopathy in the cervical, axillary or inguinal LUNGS: clear to auscultation and percussion with normal breathing effort HEART: regular rate & rhythm and no murmurs and no lower extremity  edema ABDOMEN:abdomen soft, non-tender and normal bowel sounds Musculoskeletal:no cyanosis of digits and no clubbing  NEURO: alert & oriented x 3 with fluent speech, no focal motor/sensory deficits  LABORATORY DATA:  I have reviewed the data as listed    Component Value Date/Time   NA 139 01/20/2020 0926   NA 139 02/13/2017 1512   K 3.8 01/20/2020 0926   K 4.2 02/13/2017 1512   CL 102 01/20/2020 0926   CO2 25 01/20/2020 0926   CO2 28 02/13/2017 1512   GLUCOSE 102 (H) 01/20/2020 0926   GLUCOSE 118 02/13/2017 1512   BUN 23 01/20/2020 0926   BUN 25.2 02/13/2017 1512   CREATININE 1.30 (H) 01/20/2020 0926   CREATININE 1.37 (H) 12/21/2019 0850   CREATININE 1.2 (H) 02/13/2017 1512   CALCIUM 8.9 01/20/2020 0926   CALCIUM 10.0 02/13/2017 1512   PROT 6.8 01/20/2020 0926   ALBUMIN 3.8 01/20/2020 0926   AST 24 01/20/2020 0926   AST 19 12/21/2019 0850   ALT 19 01/20/2020 0926   ALT 13 12/21/2019 0850   ALKPHOS 73 01/20/2020 0926   BILITOT 0.4 01/20/2020 0926   BILITOT 0.4 12/21/2019 0850   GFRNONAA 39 (L) 01/20/2020 0926   GFRNONAA 37 (L) 12/21/2019 0850   GFRAA 45 (L) 01/20/2020 0926   GFRAA 42 (L) 12/21/2019 0850    No results found for: SPEP, UPEP  Lab Results  Component Value Date   WBC 5.0 01/20/2020   NEUTROABS 2.7 01/20/2020   HGB 10.8 (L) 01/20/2020   HCT 31.6 (L) 01/20/2020   MCV 92.1 01/20/2020   PLT 191 01/20/2020      Chemistry      Component Value Date/Time   NA 139 01/20/2020 0926   NA 139 02/13/2017 1512   K 3.8 01/20/2020 0926   K 4.2 02/13/2017 1512   CL 102 01/20/2020 0926   CO2 25 01/20/2020 0926   CO2 28 02/13/2017 1512   BUN 23 01/20/2020 0926   BUN 25.2 02/13/2017 1512   CREATININE 1.30 (H) 01/20/2020 0926   CREATININE 1.37 (H) 12/21/2019 0850   CREATININE 1.2 (H) 02/13/2017 1512      Component Value Date/Time   CALCIUM 8.9 01/20/2020 0926   CALCIUM 10.0 02/13/2017 1512   ALKPHOS 73 01/20/2020 0926   AST 24 01/20/2020 0926   AST 19  12/21/2019 0850   ALT 19 01/20/2020 0926   ALT 13 12/21/2019 0850   BILITOT 0.4 01/20/2020 0926   BILITOT 0.4 12/21/2019 0850       RADIOGRAPHIC STUDIES: I have personally reviewed the radiological images as listed and agreed with the findings in the report. ECHOCARDIOGRAM COMPLETE  Result Date: 01/07/2020    ECHOCARDIOGRAM REPORT   Patient Name:   [name] Date of Exam: 01/07/2020 Medical Rec #:  [id number]      Height:       62.2 in Accession #:    [id number]     Weight:       130.8 lb Date of Birth:  07/05/1941      BSA:  1.600 m Patient Age:    28 years       BP:           147/78 mmHg Patient Gender: F              HR:           56 bpm. Exam Location:  Outpatient Procedure: 2D Echo, Cardiac Doppler, Color Doppler, 3D Echo and Strain Analysis Indications:    Z51.11 Encounter for antineoplastic chemotheraphy  History:        Patient has prior history of Echocardiogram examinations, most                 recent 09/30/2019. Risk Factors:Hypertension and Dyslipidemia.                 History of Breast cancer. Metastatic cancer.  Sonographer:    Roseanna Rainbow Referring Phys: [id number] Lailany Enoch [place]  1. Normal LV function; grade 1 diastolic dysfunction; mild AI; GLS -21%.  2. Left ventricular ejection fraction, by estimation, is 55 to 60%. The left ventricle has normal function. The left ventricle has no regional wall motion abnormalities. Left ventricular diastolic parameters are consistent with Grade I diastolic dysfunction (impaired relaxation).  3. Right ventricular systolic function is normal. The right ventricular size is normal.  4. The mitral valve is normal in structure. Trivial mitral valve regurgitation. No evidence of mitral stenosis.  5. The aortic valve is tricuspid. Aortic valve regurgitation is mild. Mild aortic valve sclerosis is present, with no evidence of aortic valve stenosis.  6. The inferior vena cava is normal in size with greater than 50% respiratory variability,  suggesting right atrial pressure of 3 mmHg. FINDINGS  Left Ventricle: Left ventricular ejection fraction, by estimation, is 55 to 60%. The left ventricle has normal function. The left ventricle has no regional wall motion abnormalities. The left ventricular internal cavity size was normal in size. There is  no left ventricular hypertrophy. Left ventricular diastolic parameters are consistent with Grade I diastolic dysfunction (impaired relaxation). Right Ventricle: The right ventricular size is normal.Right ventricular systolic function is normal. Left Atrium: Left atrial size was normal in size. Right Atrium: Right atrial size was normal in size. Pericardium: There is no evidence of pericardial effusion. Mitral Valve: The mitral valve is normal in structure. Normal mobility of the mitral valve leaflets. Trivial mitral valve regurgitation. No evidence of mitral valve stenosis. Tricuspid Valve: The tricuspid valve is normal in structure. Tricuspid valve regurgitation is trivial. No evidence of tricuspid stenosis. Aortic Valve: The aortic valve is tricuspid. Aortic valve regurgitation is mild. Aortic regurgitation PHT measures 591 msec. Mild aortic valve sclerosis is present, with no evidence of aortic valve stenosis. Pulmonic Valve: The pulmonic valve was normal in structure. Pulmonic valve regurgitation is not visualized. No evidence of pulmonic stenosis. Aorta: The aortic root is normal in size and structure. Venous: The inferior vena cava is normal in size with greater than 50% respiratory variability, suggesting right atrial pressure of 3 mmHg. IAS/Shunts: No atrial level shunt detected by color flow Doppler. Additional Comments: Normal LV function; grade 1 diastolic dysfunction; mild AI; GLS -21%.  LEFT VENTRICLE PLAX 2D LVIDd:         3.00 cm     Diastology LVIDs:         1.90 cm     LV e' lateral:   8.05 cm/s LV PW:         1.30 cm     LV E/e'  lateral: 9.5 LV IVS:        1.30 cm     LV e' medial:    5.98 cm/s  LVOT diam:     1.90 cm     LV E/e' medial:  12.8 LV SV:         57 LV SV Index:   36 LVOT Area:     2.84 cm  LV Volumes (MOD) LV vol d, MOD A2C: 59.1 ml LV vol d, MOD A4C: 63.7 ml LV vol s, MOD A2C: 22.7 ml LV vol s, MOD A4C: 27.3 ml LV SV MOD A2C:     36.4 ml LV SV MOD A4C:     63.7 ml LV SV MOD BP:      35.1 ml RIGHT VENTRICLE            IVC RV S prime:     8.81 cm/s  IVC diam: 1.10 cm TAPSE (M-mode): 2.1 cm LEFT ATRIUM             Index       RIGHT ATRIUM          Index LA diam:        4.10 cm 2.56 cm/m  RA Area:     9.07 cm LA Vol (A2C):   36.9 ml 23.07 ml/m RA Volume:   17.50 ml 10.94 ml/m LA Vol (A4C):   22.2 ml 13.88 ml/m LA Biplane Vol: 29.1 ml 18.19 ml/m  AORTIC VALVE LVOT Vmax:   82.50 cm/s LVOT Vmean:  55.900 cm/s LVOT VTI:    0.202 m AI PHT:      591 msec  AORTA Ao Root diam: 3.00 cm MITRAL VALVE MV Area (PHT): 3.03 cm    SHUNTS MV Decel Time: 250 msec    Systemic VTI:  0.20 m MV E velocity: 76.70 cm/s  Systemic Diam: 1.90 cm MV A velocity: 77.10 cm/s MV E/A ratio:  0.99 Kirk Ruths MD Electronically signed by Kirk Ruths MD Signature Date/Time: 01/07/2020/11:21:17 AM    Final

## 2020-01-20 NOTE — Telephone Encounter (Signed)
Scheduled appts per 7/8 sch msg. Pt declined print out of AVS and stated she would refer to mychart.

## 2020-01-20 NOTE — Patient Instructions (Signed)

## 2020-01-20 NOTE — Assessment & Plan Note (Addendum)
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

## 2020-01-20 NOTE — Assessment & Plan Note (Signed)
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 

## 2020-01-20 NOTE — Progress Notes (Signed)
Reported magnesium 0.9 to Dr. Alvy Bimler.

## 2020-01-20 NOTE — Assessment & Plan Note (Signed)
The cause of the low magnesium is unknown [name] is not known to cause low magnesium We discussed nephrology referral and she agreed, appointment pending She will receive IV magnesium replacement today

## 2020-01-20 NOTE — Assessment & Plan Note (Signed)
Renal function is stable. Monitor closely Nephrology consult is pending

## 2020-01-31 DIAGNOSIS — M1990 Unspecified osteoarthritis, unspecified site: Secondary | ICD-10-CM | POA: Diagnosis not present

## 2020-01-31 DIAGNOSIS — C541 Malignant neoplasm of endometrium: Secondary | ICD-10-CM | POA: Diagnosis not present

## 2020-01-31 DIAGNOSIS — E039 Hypothyroidism, unspecified: Secondary | ICD-10-CM | POA: Diagnosis not present

## 2020-01-31 DIAGNOSIS — Z5111 Encounter for antineoplastic chemotherapy: Secondary | ICD-10-CM | POA: Diagnosis not present

## 2020-01-31 DIAGNOSIS — C7800 Secondary malignant neoplasm of unspecified lung: Secondary | ICD-10-CM | POA: Diagnosis not present

## 2020-01-31 DIAGNOSIS — D849 Immunodeficiency, unspecified: Secondary | ICD-10-CM | POA: Diagnosis not present

## 2020-01-31 DIAGNOSIS — C50919 Malignant neoplasm of unspecified site of unspecified female breast: Secondary | ICD-10-CM | POA: Diagnosis not present

## 2020-01-31 DIAGNOSIS — I10 Essential (primary) hypertension: Secondary | ICD-10-CM | POA: Diagnosis not present

## 2020-02-14 DIAGNOSIS — N1832 Chronic kidney disease, stage 3b: Secondary | ICD-10-CM | POA: Diagnosis not present

## 2020-02-14 DIAGNOSIS — D630 Anemia in neoplastic disease: Secondary | ICD-10-CM | POA: Diagnosis not present

## 2020-02-14 DIAGNOSIS — R799 Abnormal finding of blood chemistry, unspecified: Secondary | ICD-10-CM | POA: Diagnosis not present

## 2020-02-14 DIAGNOSIS — N189 Chronic kidney disease, unspecified: Secondary | ICD-10-CM | POA: Diagnosis not present

## 2020-02-14 DIAGNOSIS — C541 Malignant neoplasm of endometrium: Secondary | ICD-10-CM | POA: Diagnosis not present

## 2020-02-14 DIAGNOSIS — I129 Hypertensive chronic kidney disease with stage 1 through stage 4 chronic kidney disease, or unspecified chronic kidney disease: Secondary | ICD-10-CM | POA: Diagnosis not present

## 2020-02-15 ENCOUNTER — Inpatient Hospital Stay: Payer: Medicare HMO

## 2020-02-15 ENCOUNTER — Other Ambulatory Visit: Payer: Self-pay

## 2020-02-15 ENCOUNTER — Telehealth: Payer: Self-pay

## 2020-02-15 ENCOUNTER — Inpatient Hospital Stay: Payer: Medicare HMO | Attending: Gynecologic Oncology

## 2020-02-15 VITALS — BP 169/74 | HR 62 | Temp 97.9°F | Resp 18

## 2020-02-15 DIAGNOSIS — Z79899 Other long term (current) drug therapy: Secondary | ICD-10-CM | POA: Diagnosis not present

## 2020-02-15 DIAGNOSIS — Z5112 Encounter for antineoplastic immunotherapy: Secondary | ICD-10-CM | POA: Insufficient documentation

## 2020-02-15 DIAGNOSIS — C779 Secondary and unspecified malignant neoplasm of lymph node, unspecified: Secondary | ICD-10-CM | POA: Insufficient documentation

## 2020-02-15 DIAGNOSIS — N183 Chronic kidney disease, stage 3 unspecified: Secondary | ICD-10-CM | POA: Insufficient documentation

## 2020-02-15 DIAGNOSIS — Z90710 Acquired absence of both cervix and uterus: Secondary | ICD-10-CM | POA: Diagnosis not present

## 2020-02-15 DIAGNOSIS — E042 Nontoxic multinodular goiter: Secondary | ICD-10-CM | POA: Diagnosis not present

## 2020-02-15 DIAGNOSIS — D631 Anemia in chronic kidney disease: Secondary | ICD-10-CM | POA: Diagnosis not present

## 2020-02-15 DIAGNOSIS — Z7982 Long term (current) use of aspirin: Secondary | ICD-10-CM | POA: Insufficient documentation

## 2020-02-15 DIAGNOSIS — C541 Malignant neoplasm of endometrium: Secondary | ICD-10-CM

## 2020-02-15 LAB — CBC WITH DIFFERENTIAL/PLATELET
Abs Immature Granulocytes: 0.03 10*3/uL (ref 0.00–0.07)
Basophils Absolute: 0.1 10*3/uL (ref 0.0–0.1)
Basophils Relative: 1 %
Eosinophils Absolute: 0.2 10*3/uL (ref 0.0–0.5)
Eosinophils Relative: 4 %
HCT: 31.8 % — ABNORMAL LOW (ref 36.0–46.0)
Hemoglobin: 11 g/dL — ABNORMAL LOW (ref 12.0–15.0)
Immature Granulocytes: 1 %
Lymphocytes Relative: 32 %
Lymphs Abs: 1.9 10*3/uL (ref 0.7–4.0)
MCH: 31.1 pg (ref 26.0–34.0)
MCHC: 34.6 g/dL (ref 30.0–36.0)
MCV: 89.8 fL (ref 80.0–100.0)
Monocytes Absolute: 0.6 10*3/uL (ref 0.1–1.0)
Monocytes Relative: 10 %
Neutro Abs: 3.1 10*3/uL (ref 1.7–7.7)
Neutrophils Relative %: 52 %
Platelets: 190 10*3/uL (ref 150–400)
RBC: 3.54 MIL/uL — ABNORMAL LOW (ref 3.87–5.11)
RDW: 12 % (ref 11.5–15.5)
WBC: 5.9 10*3/uL (ref 4.0–10.5)
nRBC: 0 % (ref 0.0–0.2)

## 2020-02-15 LAB — COMPREHENSIVE METABOLIC PANEL
ALT: 13 U/L (ref 0–44)
AST: 19 U/L (ref 15–41)
Albumin: 3.8 g/dL (ref 3.5–5.0)
Alkaline Phosphatase: 69 U/L (ref 38–126)
Anion gap: 11 (ref 5–15)
BUN: 29 mg/dL — ABNORMAL HIGH (ref 8–23)
CO2: 24 mmol/L (ref 22–32)
Calcium: 9.7 mg/dL (ref 8.9–10.3)
Chloride: 102 mmol/L (ref 98–111)
Creatinine, Ser: 1.22 mg/dL — ABNORMAL HIGH (ref 0.44–1.00)
GFR calc Af Amer: 48 mL/min — ABNORMAL LOW (ref >60)
GFR calc non Af Amer: 42 mL/min — ABNORMAL LOW (ref >60)
Glucose, Bld: 95 mg/dL (ref 70–99)
Potassium: 3.5 mmol/L (ref 3.5–5.1)
Sodium: 137 mmol/L (ref 135–145)
Total Bilirubin: 0.3 mg/dL (ref 0.3–1.2)
Total Protein: 6.7 g/dL (ref 6.5–8.1)

## 2020-02-15 LAB — MAGNESIUM: Magnesium: 1 mg/dL — CL (ref 1.7–2.4)

## 2020-02-15 MED ORDER — SODIUM CHLORIDE 0.9% FLUSH
10.0000 mL | INTRAVENOUS | Status: DC | PRN
  Administered 2020-02-15: 10 mL
  Filled 2020-02-15: qty 10

## 2020-02-15 MED ORDER — HEPARIN SOD (PORK) LOCK FLUSH 100 UNIT/ML IV SOLN
500.0000 [IU] | Freq: Once | INTRAVENOUS | Status: AC | PRN
  Administered 2020-02-15: 500 [IU]
  Filled 2020-02-15: qty 5

## 2020-02-15 MED ORDER — SODIUM CHLORIDE 0.9 % IV SOLN: 6.0000 g | Freq: Once | INTRAVENOUS | Status: DC

## 2020-02-15 MED ORDER — TRASTUZUMAB-DKST CHEMO 150 MG IV SOLR
6.0000 mg/kg | Freq: Once | INTRAVENOUS | Status: AC
  Administered 2020-02-15: 357 mg via INTRAVENOUS
  Filled 2020-02-15: qty 17

## 2020-02-15 MED ORDER — DIPHENHYDRAMINE HCL 25 MG PO CAPS
ORAL_CAPSULE | ORAL | Status: AC
  Filled 2020-02-15: qty 1

## 2020-02-15 MED ORDER — ACETAMINOPHEN 325 MG PO TABS
ORAL_TABLET | ORAL | Status: AC
  Filled 2020-02-15: qty 2

## 2020-02-15 MED ORDER — SODIUM CHLORIDE 0.9 % IV SOLN
Freq: Once | INTRAVENOUS | Status: AC
  Filled 2020-02-15: qty 250

## 2020-02-15 MED ORDER — SODIUM CHLORIDE 0.9% FLUSH
10.0000 mL | Freq: Once | INTRAVENOUS | Status: AC
  Administered 2020-02-15: 10 mL
  Filled 2020-02-15: qty 10

## 2020-02-15 MED ORDER — ACETAMINOPHEN 325 MG PO TABS
650.0000 mg | ORAL_TABLET | Freq: Once | ORAL | Status: AC
  Administered 2020-02-15: 650 mg via ORAL

## 2020-02-15 MED ORDER — DIPHENHYDRAMINE HCL 25 MG PO TABS
25.0000 mg | ORAL_TABLET | Freq: Once | ORAL | Status: AC
  Administered 2020-02-15: 25 mg via ORAL
  Filled 2020-02-15: qty 1

## 2020-02-15 NOTE — Patient Instructions (Signed)
[facility] Discharge Instructions for Patients Receiving Chemotherapy  Today you received the following chemotherapy agents Trastuzumab  To help prevent nausea and vomiting after your treatment, we encourage you to take your nausea medication as directed.    If you develop nausea and vomiting that is not controlled by your nausea medication, call the clinic.   BELOW ARE SYMPTOMS THAT SHOULD BE REPORTED IMMEDIATELY:  *FEVER GREATER THAN 100.5 F  *CHILLS WITH OR WITHOUT FEVER  NAUSEA AND VOMITING THAT IS NOT CONTROLLED WITH YOUR NAUSEA MEDICATION  *UNUSUAL SHORTNESS OF BREATH  *UNUSUAL BRUISING OR BLEEDING  TENDERNESS IN MOUTH AND THROAT WITH OR WITHOUT PRESENCE OF ULCERS  *URINARY PROBLEMS  *BOWEL PROBLEMS  UNUSUAL RASH Items with * indicate a potential emergency and should be followed up as soon as possible.  Feel free to call the clinic should you have any questions or concerns. The clinic phone number is [phone].  Please show the [facility] at check-in to the Emergency Department and triage nurse.   Hypomagnesemia Hypomagnesemia is a condition in which the level of magnesium in the blood is low. Magnesium is a mineral that is found in many foods. It is used in many different processes in the body. Hypomagnesemia can affect every organ in the body. In severe cases, it can cause life-threatening problems. What are the causes? This condition may be caused by:  Not getting enough magnesium in your diet.  Malnutrition.  Problems with absorbing magnesium from the intestines.  Dehydration.  Alcohol abuse.  Vomiting.  Severe or chronic diarrhea.  Some medicines, including medicines that make you urinate more (diuretics).  Certain diseases, such as kidney disease, diabetes, celiac disease, and overactive thyroid. What are the signs or symptoms? Symptoms of this condition include:  Loss of appetite.  Nausea and vomiting.  Involuntary  shaking or trembling of a body part (tremor).  Muscle weakness.  Tingling in the arms and legs.  Sudden tightening of muscles (muscle spasms).  Confusion.  Psychiatric issues, such as depression, irritability, or psychosis.  A feeling of fluttering of the heart.  Seizures. These symptoms are more severe if magnesium levels drop suddenly. How is this diagnosed? This condition may be diagnosed based on:  Your symptoms and medical history.  A physical exam.  Blood and urine tests. How is this treated? Treatment depends on the cause and the severity of the condition. It may be treated with:  A magnesium supplement. This can be taken in pill form. If the condition is severe, magnesium is usually given through an IV.  Changes to your diet. You may be directed to eat foods that have a lot of magnesium, such as green leafy vegetables, peas, beans, and nuts.  Stopping any intake of alcohol. Follow these instructions at home:      Make sure that your diet includes foods with magnesium. Foods that have a lot of magnesium in them include: ? Green leafy vegetables, such as spinach and broccoli. ? Beans and peas. ? Nuts and seeds, such as almonds and sunflower seeds. ? Whole grains, such as whole grain bread and fortified cereals.  Take magnesium supplements if your health care provider tells you to do that. Take them as directed.  Take over-the-counter and prescription medicines only as told by your health care provider.  Have your magnesium levels monitored as told by your health care provider.  When you are active, drink fluids that contain electrolytes.  Avoid drinking alcohol.  Keep all  follow-up visits as told by your health care provider. This is important. Contact a health care provider if:  You get worse instead of better.  Your symptoms return. Get help right away if you:  Develop severe muscle weakness.  Have trouble breathing.  Feel that your heart is  racing. Summary  Hypomagnesemia is a condition in which the level of magnesium in the blood is low.  Hypomagnesemia can affect every organ in the body.  Treatment may include eating more foods that contain magnesium, taking magnesium supplements, and not drinking alcohol.  Have your magnesium levels monitored as told by your health care provider. This information is not intended to replace advice given to you by your health care provider. Make sure you discuss any questions you have with your health care provider. Document Revised: 06/13/2017 Document Reviewed: 06/02/2017 Elsevier Patient Education  2020 [employer].

## 2020-02-15 NOTE — Progress Notes (Signed)
Reported critical Magnesium of 1. To Dr. Alvy Bimler.

## 2020-02-15 NOTE — Progress Notes (Signed)
Per Dr. Alvy Bimler, patient will not receive magnesium today

## 2020-02-15 NOTE — Telephone Encounter (Signed)
Critical Value: Magnesium 1.0 Harrel Lemon, RN notified

## 2020-02-16 DIAGNOSIS — Z85828 Personal history of other malignant neoplasm of skin: Secondary | ICD-10-CM | POA: Diagnosis not present

## 2020-02-16 DIAGNOSIS — L570 Actinic keratosis: Secondary | ICD-10-CM | POA: Diagnosis not present

## 2020-02-17 DIAGNOSIS — N1832 Chronic kidney disease, stage 3b: Secondary | ICD-10-CM | POA: Diagnosis not present

## 2020-03-07 ENCOUNTER — Inpatient Hospital Stay: Payer: Medicare HMO | Admitting: Hematology and Oncology

## 2020-03-07 ENCOUNTER — Inpatient Hospital Stay: Payer: Medicare HMO

## 2020-03-07 ENCOUNTER — Other Ambulatory Visit: Payer: Self-pay

## 2020-03-07 ENCOUNTER — Encounter: Payer: Self-pay | Admitting: Hematology and Oncology

## 2020-03-07 VITALS — BP 139/72 | HR 65 | Temp 98.0°F | Resp 18 | Ht 62.2 in | Wt 133.0 lb

## 2020-03-07 DIAGNOSIS — Z299 Encounter for prophylactic measures, unspecified: Secondary | ICD-10-CM

## 2020-03-07 DIAGNOSIS — N183 Chronic kidney disease, stage 3 unspecified: Secondary | ICD-10-CM

## 2020-03-07 DIAGNOSIS — C541 Malignant neoplasm of endometrium: Secondary | ICD-10-CM

## 2020-03-07 DIAGNOSIS — D630 Anemia in neoplastic disease: Secondary | ICD-10-CM | POA: Diagnosis not present

## 2020-03-07 DIAGNOSIS — Z5112 Encounter for antineoplastic immunotherapy: Secondary | ICD-10-CM | POA: Diagnosis not present

## 2020-03-07 LAB — COMPREHENSIVE METABOLIC PANEL
ALT: 9 U/L (ref 0–44)
AST: 17 U/L (ref 15–41)
Albumin: 3.7 g/dL (ref 3.5–5.0)
Alkaline Phosphatase: 67 U/L (ref 38–126)
Anion gap: 10 (ref 5–15)
BUN: 32 mg/dL — ABNORMAL HIGH (ref 8–23)
CO2: 26 mmol/L (ref 22–32)
Calcium: 9.7 mg/dL (ref 8.9–10.3)
Chloride: 102 mmol/L (ref 98–111)
Creatinine, Ser: 1.44 mg/dL — ABNORMAL HIGH (ref 0.44–1.00)
GFR calc Af Amer: 40 mL/min — ABNORMAL LOW (ref >60)
GFR calc non Af Amer: 34 mL/min — ABNORMAL LOW (ref >60)
Glucose, Bld: 96 mg/dL (ref 70–99)
Potassium: 3.9 mmol/L (ref 3.5–5.1)
Sodium: 138 mmol/L (ref 135–145)
Total Bilirubin: 0.3 mg/dL (ref 0.3–1.2)
Total Protein: 6.7 g/dL (ref 6.5–8.1)

## 2020-03-07 LAB — CBC WITH DIFFERENTIAL/PLATELET
Abs Immature Granulocytes: 0.04 10*3/uL (ref 0.00–0.07)
Basophils Absolute: 0.1 10*3/uL (ref 0.0–0.1)
Basophils Relative: 2 %
Eosinophils Absolute: 0.2 10*3/uL (ref 0.0–0.5)
Eosinophils Relative: 3 %
HCT: 31.1 % — ABNORMAL LOW (ref 36.0–46.0)
Hemoglobin: 10.7 g/dL — ABNORMAL LOW (ref 12.0–15.0)
Immature Granulocytes: 1 %
Lymphocytes Relative: 26 %
Lymphs Abs: 1.9 10*3/uL (ref 0.7–4.0)
MCH: 31.2 pg (ref 26.0–34.0)
MCHC: 34.4 g/dL (ref 30.0–36.0)
MCV: 90.7 fL (ref 80.0–100.0)
Monocytes Absolute: 0.6 10*3/uL (ref 0.1–1.0)
Monocytes Relative: 9 %
Neutro Abs: 4.2 10*3/uL (ref 1.7–7.7)
Neutrophils Relative %: 59 %
Platelets: 223 10*3/uL (ref 150–400)
RBC: 3.43 MIL/uL — ABNORMAL LOW (ref 3.87–5.11)
RDW: 11.8 % (ref 11.5–15.5)
WBC: 7.1 10*3/uL (ref 4.0–10.5)
nRBC: 0 % (ref 0.0–0.2)

## 2020-03-07 MED ORDER — DIPHENHYDRAMINE HCL 25 MG PO TABS
25.0000 mg | ORAL_TABLET | Freq: Once | ORAL | Status: AC
  Administered 2020-03-07: 25 mg via ORAL
  Filled 2020-03-07: qty 1

## 2020-03-07 MED ORDER — ACETAMINOPHEN 325 MG PO TABS
ORAL_TABLET | ORAL | Status: AC
  Filled 2020-03-07: qty 2

## 2020-03-07 MED ORDER — TRASTUZUMAB-DKST CHEMO 150 MG IV SOLR
6.0000 mg/kg | Freq: Once | INTRAVENOUS | Status: AC
  Administered 2020-03-07: 357 mg via INTRAVENOUS
  Filled 2020-03-07: qty 17

## 2020-03-07 MED ORDER — ACETAMINOPHEN 325 MG PO TABS
650.0000 mg | ORAL_TABLET | Freq: Once | ORAL | Status: AC
  Administered 2020-03-07: 650 mg via ORAL

## 2020-03-07 MED ORDER — SODIUM CHLORIDE 0.9% FLUSH
10.0000 mL | Freq: Once | INTRAVENOUS | Status: AC
  Administered 2020-03-07: 10 mL
  Filled 2020-03-07: qty 10

## 2020-03-07 MED ORDER — DIPHENHYDRAMINE HCL 25 MG PO CAPS
ORAL_CAPSULE | ORAL | Status: AC
  Filled 2020-03-07: qty 1

## 2020-03-07 MED ORDER — HEPARIN SOD (PORK) LOCK FLUSH 100 UNIT/ML IV SOLN
500.0000 [IU] | Freq: Once | INTRAVENOUS | Status: AC | PRN
  Administered 2020-03-07: 500 [IU]
  Filled 2020-03-07: qty 5

## 2020-03-07 MED ORDER — SODIUM CHLORIDE 0.9 % IV SOLN
Freq: Once | INTRAVENOUS | Status: AC
  Filled 2020-03-07: qty 250

## 2020-03-07 MED ORDER — SODIUM CHLORIDE 0.9% FLUSH
10.0000 mL | INTRAVENOUS | Status: DC | PRN
  Administered 2020-03-07: 10 mL
  Filled 2020-03-07: qty 10

## 2020-03-07 NOTE — Progress Notes (Signed)
Vicki Cervantes OFFICE PROGRESS NOTE  Patient Care Team: Burnard Bunting, [name]  ASSESSMENT & PLAN:  Endometrial cancer [facility]) She is doing well, no side-effects from treatment Exam is benign We will follow-up with repeat CT scan next month In the meantime, we will continue [name] treatment indefinitely Recent echocardiogram showed preserved ejection fraction, I will order repeat echocardiogram end of next month  Anemia in neoplastic disease She has multifactorial anemia, anemia secondary to prior chemotherapy as well as chronic kidney disease Anemia is improving/stable Recent vitamin B12 level was adequate She is not symptomatic Observe only  CKD (chronic kidney disease), stage III ([place]) Renal function is stable. Monitor closely She was seen by nephrologist recently  Hypomagnesemia She has been getting chronic IV magnesium replacement therapy According to the patient, her nephrologist felt that it is not necessary at this point As long as her potassium level is good, she does not need IV magnesium replacement  Preventive measure We discussed the importance of social distancing even when an individual is fully vaccinated She is interested for the booster vaccine but we do not give out more than a booster vaccine now I recommend she gets it from her local pharmacy She will be eligible for influenza vaccination next month   Orders Placed This Encounter  Procedures  . CT ABDOMEN PELVIS W CONTRAST    Standing Status:   Future    Standing Expiration Date:   03/07/2021    Order Specific Question:   If indicated for the ordered procedure, I authorize the administration of contrast media per Radiology protocol    Answer:   Yes    Order Specific Question:   Preferred imaging location?    Answer:   [facility]    Order Specific Question:   Radiology Contrast Protocol - do NOT remove file path    Answer:    _[email or web address]    All questions were answered. The patient knows to call the clinic with any problems, questions or concerns. The total time spent in the appointment was 30 minutes encounter with patients including review of chart and various tests results, discussions about plan of care and coordination of care plan   Vicki Lark, MD 03/07/2020 10:15 AM  INTERVAL HISTORY: Please see below for problem oriented charting. She returns for further follow-up She has seen a nephrologist recently She was told she does not need IV magnesium replacement She was placed on oral magnesium recently but broke out in a rash She also have some nonspecific symptoms especially in regards to reflux symptoms Most of her symptoms have resolved today Her skin rash has resolved She denies side effects of treatment so far No recent cough, chest pain or shortness of breath No recent infusion reaction Denies recent infection No new lymphadenopathy  SUMMARY OF ONCOLOGIC HISTORY: Oncology History Overview Note  Vicki Cervantes  has a remote history of left  breast cancer at age 72 but received BRCA testing approximately 5 years ago which was negative. Her cancer was treated with surgery but no radiation or chemotherapy.   Serous endometrial cancer, MSI stable ER positive, PR neg, Her2/neu 3+      Endometrial cancer ([place])  08/29/2016 Pathology Results   Endometrium, biopsy - HIGH GRADE ENDOMETRIAL CARCINOMA, SEE COMMENT. Microscopic Comment The sections show multiple fragments of adenocarcinoma displaying glandular and papillary patterns associated with high grade cytomorphology characterized by nuclear pleomorphism, prominent nucleoli and brisk mitosis. Immunohistochemical stains show that  the tumor cells are positive for vimentin, p16, p53 with increased Ki-67 expression. Estrogen and progesterone receptor stains show patchy weak positivity. No significant positivity is seen with  CEA. The findings are consistent with high grade endometrial carcinoma and the overall morphology and phenotypic features favor serous carcinoma.   08/29/2016 Initial Diagnosis   She presented with postmenopausal bleeding   10/03/2016 Imaging   CT C/A/P 09/2016 IMPRESSION: 1. Thickening of the endometrial canal up to 19 mm in fundus, presumably corresponding to the patient's reported endometrial carcinoma. 2. Multiple tiny pulmonary nodules scattered throughout the lungs bilaterally measuring 4 mm or less in size. Nodules of this size are typically considered statistically likely benign. In the setting of known primary malignancy, metastatic disease to the lungs is not excluded, but is not strongly favored on today's examination. Attention on followup studies is recommended to ensure the stability or resolution of these nodules. 3. Subcentimeter low-attenuation lesion in the central aspect of segment 8 of the liver is too small to characterize. This is statistically likely a tiny cyst, but warrants attention on follow-up studies to exclude the possibility of a solitary hepatic metastasis. 4. 1.5 x 1.5 x 1.7 cm well-circumscribed lesion in the proximal stomach. This is of uncertain etiology and significance, and could represent a benign gastric polyp, however, further evaluation with nonemergent endoscopy is suggested in the near future for further evaluation. 5. **An incidental finding of potential clinical significance has been found. 1.1 x 1.6 cm thyroid nodule in the inferior aspect of the right lobe of the thyroid gland. Follow-up evaluation with nonemergent thyroid ultrasound is recommended in the near future to better evaluate this finding. This recommendation follows ACR consensuss guidelines: Managing Incidental Thyroid Nodules Detected on Imaging: White Paper of the ACR Incidental Thyroid Findings Committee. J Am Coll Radiol 2015;12(2):143-150.** 6. Aortic atherosclerosis, in addition to left  anterior descending coronary artery disease   10/22/2016 Surgery   Robotic assisted total hysterectomy, BSO and bilateral pelvic lymphadenectomy  Final pathology revealed a 3cm polyp containing serous carcinoma but with no myometrial invasion, no LVSI and negative nodes.  Stage IA Uterine serous cancer   10/22/2016 Pathology Results   1. Lymph nodes, regional resection, right pelvic - SIX BENIGN LYMPH NODES (0/6). 2. Lymph nodes, regional resection, left pelvic - SEVEN BENIGN LYMPH NODES (0/7). 3. Uterus +/- tubes/ovaries, neoplastic, with right ovary and fallopian tube ENDOMYOMETRIUM - SEROUS CARCINOMA ARISING WITHIN AN ENDOMETRIAL POLYP - NO MYOMETRIAL INVASION IDENTIFIED - ADENOMYOSIS - LEIOMYOMA (1 CM) - SEE ONCOLOGY TABLE AND COMMENT CERVIX - CARCINOMA FOCALLY INVOLVES ENDOCERVICAL GLANDS - NABOTHIAN CYSTS RIGHT ADNEXA - BENIGN OVARY AND FALLOPIAN TUBE - NO CARCINOMA IDENTIFIED 4. Cul-de-sac biopsy - MESOTHELIAL HYPERPLASIA Microscopic Comment 3. ONCOLOGY TABLE-UTERUS, CARCINOMA OR CARCINOSARCOMA Specimen: Uterus, right fallopian tube and ovary Procedure: Total hysterectomy and right salpingo-oophorectomy Lymph node sampling performed: Bilateral pelvic regional resection Specimen integrity: Intact Maximum tumor size: 3 cm (polyp) Histologic type: Serous carcinoma Grade: High grade Myometrial invasion: Not identified Cervical stromal involvement: No, focal endocervical gland involvement Extent of involvement of other organs: Not identified Lymph - vascular invasion: Not identified Peritoneal washings: N/A Lymph nodes: Examined: 0 Sentinel 13 Non-sentinel 13 Total Lymph nodes with metastasis: 0 Isolated tumor cells (< 0.2 mm): 0 Micrometastasis: (> 0.2 mm and < 2.0 mm): 0 Macrometastasis: (> 2.0 mm): 0 Extracapsular extension: N/A Pelvic lymph nodes: 0 involved of 13 lymph nodes. Para-aortic lymph nodes: No para-aortic nodes submitted TNM code: pT1a, pNX FIGO  Stage (based  on pathologic findings, needs clinical correlation): IA Comment: Immunohistochemistry for cytokeratin AE1/AE3 is performed on all of the lymph nodes (parts 1 & 2) and no metastatic carcinoma is identified.   07/04/2017 Genetic Testing   The patient had genetic testing due to a personal history of breast and uterine cancer, and a family history of stomach cancer.  The Multi-Cancer Panel was ordered. The Multi-Cancer Panel offered by [facility] includes sequencing and/or deletion duplication testing of the following 83 genes: ALK, APC, ATM, AXIN2,BAP1,  BARD1, BLM, BMPR1A, BRCA1, BRCA2, BRIP1, CASR, CDC73, CDH1, CDK4, CDKN1B, CDKN1C, CDKN2A (p14ARF), CDKN2A (p16INK4a), CEBPA, CHEK2, CTNNA1, DICER1, DIS3L2, EGFR (c.2369C>T, p.Thr790Met variant only), EPCAM (Deletion/duplication testing only), FH, FLCN, GATA2, GPC3, GREM1 (Promoter region deletion/duplication testing only), HOXB13 (c.251G>A, p.Gly84Glu), HRAS, KIT, MAX, MEN1, MET, MITF (c.952G>A, p.Glu318Lys variant only), MLH1, MSH2, MSH3, MSH6, MUTYH, NBN, NF1, NF2, NTHL1, PALB2, PDGFRA, PHOX2B, PMS2, POLD1, POLE, POT1, PRKAR1A, PTCH1, PTEN, RAD50, RAD51C, RAD51D, RB1, RECQL4, RET, RUNX1, SDHAF2, SDHA (sequence changes only), SDHB, SDHC, SDHD, SMAD4, SMARCA4, SMARCB1, SMARCE1, STK11, SUFU, TERC, TERT, TMEM127, TP53, TSC1, TSC2, VHL, WRN and WT1.   Results: No pathogenic variants were identified.  A variant of uncertain significance in the gene APC was identified.  c.791A>G (p.Gln264Arg).  The date of this test report is 07/04/2017.     Genetic Testing   Patient has genetic testing done for MSI. 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    12/03/2017 Imaging   CT Chest/Abd/Pelvis to follow pulmonary nodule and gastric mass IMPRESSION: 1. Stable CT of the chest. Small pulmonary nodules are unchanged when compared with previous exam. 2. No new findings identified. 3. Subcentimeter low-attenuation lesions  within the liver are remain too small to characterize but are stable from prior exam. 4. Persistent indeterminate low-attenuation structure within the proximal stomach is unchanged measuring 1.4 cm. Correlation with direct visualization is advised   06/30/2018 Imaging   CT CHEST Lungs/Pleura: Stable scattered sub-cm pulmonary nodules are again seen bilaterally and are stable compared to previous studies. No new or enlarging pulmonary nodules or masses identified. No evidence of pulmonary infiltrate or pleural effusion.   08/19/2018 Echocardiogram   [place] is done in [place]: EF 60-65%. Mild impaired relaxation. (report scanned)   09/17/2018 Relapse/Recurrence   Presented with c/o vagina discharge.  Lesion noted at the left vaginal apex 89m lesion removed Path c/w high grade serous cancer   09/17/2018 Pathology Results   Vagina, biopsy, left apex - HIGH GRADE SEROUS CARCINOMA.   09/28/2018 PET scan   1. Two hypermetabolic axial lymph nodes. Unusual site for metastatic endometrial carcinoma however the activity is more intense than typically seen in reactive adenopathy. Suggest ultrasound-guided percutaneous biopsy of the larger RIGHT axial lymph node 2. No evidence of local recurrence at the vaginal cuff.  3. No metastatic adenopathy in the abdomen or pelvis. 4. Stable small pulmonary nodules   10/09/2018 Pathology Results   Lymph node, needle/core biopsy, right axilla - METASTATIC CARCINOMA, SEE COMMENT. Microscopic Comment The carcinoma appears high grade. Immunohistochemistry is positive for cytokeratin 7, PAX-8, and ER. Cytokeratin 20, CDX-2, PR, and GATA-3 are negative. The findings along with the history are consistent with a gynecologic primary.    10/09/2018 Procedure   Ultrasound-guided core biopsies of a right axillary lymph node.   10/14/2018 Cancer Staging   Staging form: Corpus Uteri - Carcinoma and Carcinosarcoma, AJCC 8th Edition - Clinical: Stage IVB (cT1, cN0, pM1) - Signed by  GHeath Lark MD on  10/14/2018   10/19/2018 Imaging   Placement of single lumen port a cath via right internal jugular vein. The catheter tip lies at the cavo-atrial junction. A power injectable port a cath was placed and is ready for immediate use.    10/21/2018 -  Chemotherapy   The patient had carboplatin and taxol for treatment   11/11/2018 -  Chemotherapy   The patient had trastuzumab ([name]) 450 mg in sodium chloride 0.9 % 250 mL chemo infusion, 483 mg, Intravenous,  Once, 6 of 6 cycles Administration: 450 mg (11/11/2018), 378 mg (12/02/2018), 378 mg (02/03/2019), 378 mg (12/23/2018), 378 mg (01/13/2019), 378 mg (02/24/2019) trastuzumab-dkst (OGIVRI) 378 mg in sodium chloride 0.9 % 250 mL chemo infusion, 6 mg/kg = 378 mg (100 % of original dose 6 mg/kg), Intravenous,  Once, 16 of 20 cycles Dose modification: 6 mg/kg (original dose 6 mg/kg, Cycle 7, Reason: Other (see comments)) Administration: 378 mg (03/24/2019), 378 mg (04/14/2019), 378 mg (05/05/2019), 378 mg (05/26/2019), 378 mg (06/16/2019), 378 mg (07/06/2019), 357 mg (07/28/2019), 357 mg (08/18/2019), 357 mg (09/07/2019), 357 mg (09/28/2019), 357 mg (10/19/2019), 357 mg (11/09/2019), 357 mg (11/30/2019), 357 mg (12/21/2019), 357 mg (01/20/2020), 357 mg (02/15/2020)  for chemotherapy treatment.    12/21/2018 PET scan   1. Interval resolution of hypermetabolic right axillary lymph nodes. No metabolic findings highly suspicious for recurrent metastatic disease. 2. New mild hypermetabolism within a borderline prominent portacaval lymph node, nonspecific. While a reactive node is favored, a lymph node metastasis cannot be entirely excluded. Suggest attention to this lymph node on follow-up PET-CT in 3-6 months. 3. Nonspecific new hypermetabolism at the ileocecal valve, more likely physiologic given absence of CT correlate. 4. Scattered subcentimeter pulmonary nodules are all stable and below PET resolution, more likely benign, continued CT surveillance advised. 5.  Chronic findings include: Aortic Atherosclerosis (ICD10-I70.0). Marked diffuse colonic diverticulosis. Coronary atherosclerosis.    12/28/2018 Echocardiogram   1. The left ventricle has normal systolic function with an ejection fraction of 60-65%. The cavity size was normal. Left ventricular diastolic Doppler parameters are consistent with impaired relaxation.  2. GLS recorded as -10.7 but LV appears hyperdynamic and tracking of endocardium appears poor.  3. The right ventricle has normal systolic function. The cavity was normal. There is no increase in right ventricular wall thickness.  4. Mild thickening of the mitral valve leaflet.  5. The aortic valve was not well visualized. Mild thickening of the aortic valve. Aortic valve regurgitation is trivial by color flow Doppler.   03/23/2019 Imaging   PET 1. No findings of hypermetabolic residual/recurrent or metastatic disease. 2. Similar low-level hypermetabolism within normal sized portocaval and right inguinal nodes, favored to be reactive. 3. Ongoing stability of small bilateral pulmonary nodules, favored to be benign. Below PET resolution. 4. Coronary artery atherosclerosis. Aortic Atherosclerosis   03/26/2019 Echocardiogram   1. The left ventricle has normal systolic function, with an ejection fraction of 55-60%. The cavity size was normal. Left ventricular diastolic Doppler parameters are consistent with impaired relaxation.  2. The right ventricle has normal systolic function. The cavity was normal.  3. The mitral valve is abnormal. Mild thickening of the mitral valve leaflet. There is mild mitral annular calcification present.  4. The tricuspid valve is grossly normal.  5. The aortic valve is tricuspid. Mild calcification of the aortic valve. No stenosis of the aortic valve.  6. The aorta is normal unless otherwise noted.  7. Normal LV systolic function; grade 1 diastolic dysfunction; NAT-55.7%.  06/30/2019 Imaging   Chest  Impression:   1. No evidence thoracic metastasis. 2. Stable small benign-appearing pulmonary nodules   Abdomen / Pelvis Impression:   1. No evidence of metastatic disease in the abdomen pelvis. 2.  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 3.  Aortic Atherosclerosis (ICD10-I70.0).   06/30/2019 Echocardiogram    1. Left ventricular ejection fraction, by visual estimation, is 60 to 65%. The left ventricle has normal function. There is no left ventricular hypertrophy.  2. Left ventricular diastolic parameters are consistent with Grade I diastolic dysfunction (impaired relaxation).  3. The left ventricle has no regional wall motion abnormalities.  4. Global right ventricle has normal systolic function.The right ventricular size is normal. No increase in right ventricular wall thickness.  5. Left atrial size was mild-moderately dilated.  6. Right atrial size was normal.  7. The mitral valve is normal in structure. Trivial mitral valve regurgitation.  8. The tricuspid valve is normal in structure. Tricuspid valve regurgitation is trivial.  9. The aortic valve is normal in structure. Aortic valve regurgitation is trivial. No evidence of aortic valve sclerosis or stenosis. 10. The pulmonic valve was grossly normal. Pulmonic valve regurgitation is not visualized. 11. Mildly elevated pulmonary artery systolic pressure. 12. The inferior vena cava is normal in size with greater than 50% respiratory variability, suggesting right atrial pressure of 3 mmHg. 13. The average left ventricular global longitudinal strain is -12.5 %. GLS underestimated due to poor endocardial tracking.     09/27/2019 Imaging   1. Multiple small, nonspecific pulmonary nodules are again noted. The previously noted lung nodules are unchanged in size from previous exam. There is a new lung nodule within the medial right lower lobe which is nonspecific measure 4 mm. Attention at follow-up imaging is recommended. 2. No  evidence of metastatic disease within the abdomen or pelvis. 3. Coronary artery calcifications.  The 4.  Aortic Atherosclerosis (ICD10-I70.0).   09/30/2019 Echocardiogram    1. Left ventricular ejection fraction, by estimation, is 60 to 65%. The left ventricle has normal function. The left ventricle has no regional wall motion abnormalities. Left ventricular diastolic parameters are consistent with Grade I diastolic dysfunction (impaired relaxation).  2. Right ventricular systolic function is normal. The right ventricular size is normal. There is normal pulmonary artery systolic pressure. The estimated right ventricular systolic pressure is 01.0 mmHg.  3. The mitral valve is normal in structure. No evidence of mitral valve regurgitation. No evidence of mitral stenosis.  4. The aortic valve is normal in structure. Aortic valve regurgitation is trivial. No aortic stenosis is present.  5. The inferior vena cava is normal in size with greater than 50% respiratory variability, suggesting right atrial pressure of 3 mmHg.     01/07/2020 Echocardiogram    1. Normal LV function; grade 1 diastolic dysfunction; mild AI; GLS -21%.  2. Left ventricular ejection fraction, by estimation, is 55 to 60%. The left ventricle has normal function. The left ventricle has no regional wall motion abnormalities. Left ventricular diastolic parameters are consistent with Grade I diastolic dysfunction (impaired relaxation).  3. Right ventricular systolic function is normal. The right ventricular size is normal.  4. The mitral valve is normal in structure. Trivial mitral valve regurgitation. No evidence of mitral stenosis.  5. The aortic valve is tricuspid. Aortic valve regurgitation is mild. Mild aortic valve sclerosis is present, with no evidence of aortic valve stenosis.  6. The inferior vena cava is normal in size with greater than 50%  respiratory variability, suggesting right atrial pressure of 3 mmHg.     Metastasis to  lymph nodes ([place])  10/14/2018 Initial Diagnosis   Metastasis to lymph nodes ([place])   10/21/2018 - 03/16/2019 Chemotherapy   The patient had palonosetron (ALOXI) injection 0.25 mg, 0.25 mg, Intravenous,  Once, 7 of 7 cycles Administration: 0.25 mg (10/21/2018), 0.25 mg (11/11/2018), 0.25 mg (12/02/2018), 0.25 mg (12/23/2018), 0.25 mg (01/13/2019), 0.25 mg (02/03/2019), 0.25 mg (02/24/2019) CARBOplatin (PARAPLATIN) 310 mg in sodium chloride 0.9 % 250 mL chemo infusion, 310 mg, Intravenous,  Once, 7 of 7 cycles Dose modification: 300 mg (original dose 311.5 mg, Cycle 4, Reason: Dose not tolerated) Administration: 310 mg (10/21/2018), 300 mg (11/11/2018), 300 mg (12/02/2018), 300 mg (12/23/2018), 300 mg (01/13/2019), 300 mg (02/03/2019), 300 mg (02/24/2019) PACLitaxel (TAXOL) 222 mg in sodium chloride 0.9 % 250 mL chemo infusion (> 23m/m2), 135 mg/m2 = 222 mg, Intravenous,  Once, 7 of 7 cycles Administration: 222 mg (10/21/2018), 222 mg (11/11/2018), 222 mg (12/02/2018), 222 mg (12/23/2018), 222 mg (01/13/2019), 222 mg (02/03/2019), 222 mg (02/24/2019) fosaprepitant (EMEND) 150 mg, dexamethasone (DECADRON) 12 mg in sodium chloride 0.9 % 145 mL IVPB, , Intravenous,  Once, 7 of 7 cycles Administration:  (10/21/2018),  (11/11/2018),  (12/02/2018),  (12/23/2018),  (01/13/2019),  (02/03/2019),  (02/24/2019)  for chemotherapy treatment.      REVIEW OF SYSTEMS:   Constitutional: Denies fevers, chills or abnormal weight loss Eyes: Denies blurriness of vision Ears, nose, mouth, throat, and face: Denies mucositis or sore throat Respiratory: Denies cough, dyspnea or wheezes Cardiovascular: Denies palpitation, chest discomfort or lower extremity swelling Gastrointestinal:  Denies nausea, heartburn or change in bowel habits Skin: Denies abnormal skin rashes Lymphatics: Denies new lymphadenopathy or easy bruising Neurological:Denies numbness, tingling or new weaknesses Behavioral/Psych: Mood is stable, no new changes  All other systems were  reviewed with the patient and are negative.  I have reviewed the past medical history, past surgical history, social history and family history with the patient and they are unchanged from previous note.  ALLERGIES:  has No Known Allergies.  MEDICATIONS:  Current Outpatient Medications  Medication Sig Dispense Refill  . acetaminophen (TYLENOL) 500 MG tablet Take 1,000 mg by mouth every 6 (six) hours as needed.    .Marland Kitchenaspirin EC 81 MG tablet Take 81 mg by mouth daily.    .Marland Kitchenatorvastatin (LIPITOR) 20 MG tablet Take 20 mg by mouth every evening.     . Calcium Carbonate-Vitamin D (CALCIUM-VITAMIN D) 500-200 MG-UNIT per tablet Take 1 tablet by mouth daily.    . citalopram (CELEXA) 20 MG tablet Take 20 mg by mouth at bedtime.     .Marland Kitchenestradiol (ESTRACE VAGINAL) 0.1 MG/GM vaginal cream Place 1 Applicatorful vaginally 3 (three) times a week. 42.5 g 12  . gabapentin (NEURONTIN) 600 MG tablet Take 600 mg by mouth at bedtime as needed.    .Marland Kitchenlevothyroxine (SYNTHROID, LEVOTHROID) 100 MCG tablet Take 100 mcg by mouth daily before breakfast.     . lidocaine-prilocaine (EMLA) cream Apply to affected area once 30 g 3  . losartan-hydrochlorothiazide (HYZAAR) 100-25 MG tablet     . magnesium oxide (MAG-OX) 400 (241.3 Mg) MG tablet Take 1 tablet (400 mg total) by mouth daily. 30 tablet 11  . metoprolol succinate (TOPROL-XL) 25 MG 24 hr tablet Take 25 mg by mouth daily.    . Multiple Vitamin (MULTIVITAMIN WITH MINERALS) TABS Take 1 tablet by mouth daily.    . ondansetron (ZOFRAN) 8 MG tablet  Take 1 tablet (8 mg total) by mouth every 8 (eight) hours as needed. Start on the third day after chemotherapy. 30 tablet 1  . pantoprazole (PROTONIX) 40 MG tablet Take 40 mg by mouth every morning.  (Patient not taking: Reported on 02/15/2020)    . valACYclovir (VALTREX) 1000 MG tablet valacyclovir 1 gram tablet     No current facility-administered medications for this visit.    PHYSICAL EXAMINATION: ECOG PERFORMANCE STATUS:  1 - Symptomatic but completely ambulatory  Vitals:   03/07/20 0949  BP: 139/72  Pulse: 65  Resp: 18  Temp: 98 F (36.7 C)  SpO2: 98%   Filed Weights   03/07/20 0949  Weight: 133 lb (60.3 kg)    GENERAL:alert, no distress and comfortable SKIN: skin color, texture, turgor are normal, no rashes or significant lesions EYES: normal, Conjunctiva are pink and non-injected, sclera clear OROPHARYNX:no exudate, no erythema and lips, buccal mucosa, and tongue normal  NECK: supple, thyroid normal size, non-tender, without nodularity LYMPH:  no palpable lymphadenopathy in the cervical, axillary or inguinal LUNGS: clear to auscultation and percussion with normal breathing effort HEART: regular rate & rhythm and no murmurs and no lower extremity edema ABDOMEN:abdomen soft, non-tender and normal bowel sounds Musculoskeletal:no cyanosis of digits and no clubbing  NEURO: alert & oriented x 3 with fluent speech, no focal motor/sensory deficits  LABORATORY DATA:  I have reviewed the data as listed    Component Value Date/Time   NA 138 03/07/2020 0930   NA 139 02/13/2017 1512   K 3.9 03/07/2020 0930   K 4.2 02/13/2017 1512   CL 102 03/07/2020 0930   CO2 26 03/07/2020 0930   CO2 28 02/13/2017 1512   GLUCOSE 96 03/07/2020 0930   GLUCOSE 118 02/13/2017 1512   BUN 32 (H) 03/07/2020 0930   BUN 25.2 02/13/2017 1512   CREATININE 1.44 (H) 03/07/2020 0930   CREATININE 1.37 (H) 12/21/2019 0850   CREATININE 1.2 (H) 02/13/2017 1512   CALCIUM 9.7 03/07/2020 0930   CALCIUM 10.0 02/13/2017 1512   PROT 6.7 03/07/2020 0930   ALBUMIN 3.7 03/07/2020 0930   AST 17 03/07/2020 0930   AST 19 12/21/2019 0850   ALT 9 03/07/2020 0930   ALT 13 12/21/2019 0850   ALKPHOS 67 03/07/2020 0930   BILITOT 0.3 03/07/2020 0930   BILITOT 0.4 12/21/2019 0850   GFRNONAA 34 (L) 03/07/2020 0930   GFRNONAA 37 (L) 12/21/2019 0850   GFRAA 40 (L) 03/07/2020 0930   GFRAA 42 (L) 12/21/2019 0850    No results found for:  SPEP, UPEP  Lab Results  Component Value Date   WBC 7.1 03/07/2020   NEUTROABS 4.2 03/07/2020   HGB 10.7 (L) 03/07/2020   HCT 31.1 (L) 03/07/2020   MCV 90.7 03/07/2020   PLT 223 03/07/2020      Chemistry      Component Value Date/Time   NA 138 03/07/2020 0930   NA 139 02/13/2017 1512   K 3.9 03/07/2020 0930   K 4.2 02/13/2017 1512   CL 102 03/07/2020 0930   CO2 26 03/07/2020 0930   CO2 28 02/13/2017 1512   BUN 32 (H) 03/07/2020 0930   BUN 25.2 02/13/2017 1512   CREATININE 1.44 (H) 03/07/2020 0930   CREATININE 1.37 (H) 12/21/2019 0850   CREATININE 1.2 (H) 02/13/2017 1512      Component Value Date/Time   CALCIUM 9.7 03/07/2020 0930   CALCIUM 10.0 02/13/2017 1512   ALKPHOS 67 03/07/2020 0930   AST  17 03/07/2020 0930   AST 19 12/21/2019 0850   ALT 9 03/07/2020 0930   ALT 13 12/21/2019 0850   BILITOT 0.3 03/07/2020 0930   BILITOT 0.4 12/21/2019 0850

## 2020-03-07 NOTE — Assessment & Plan Note (Signed)
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

## 2020-03-07 NOTE — Patient Instructions (Signed)
[facility] °Discharge Instructions for Patients Receiving Chemotherapy ° °Today you received the following chemotherapy agents Trastuzumab ° °To help prevent nausea and vomiting after your treatment, we encourage you to take your nausea medication as directed. °  °If you develop nausea and vomiting that is not controlled by your nausea medication, call the clinic.  ° °BELOW ARE SYMPTOMS THAT SHOULD BE REPORTED IMMEDIATELY: °· *FEVER GREATER THAN 100.5 F °· *CHILLS WITH OR WITHOUT FEVER °· NAUSEA AND VOMITING THAT IS NOT CONTROLLED WITH YOUR NAUSEA MEDICATION °· *UNUSUAL SHORTNESS OF BREATH °· *UNUSUAL BRUISING OR BLEEDING °· TENDERNESS IN MOUTH AND THROAT WITH OR WITHOUT PRESENCE OF ULCERS °· *URINARY PROBLEMS °· *BOWEL PROBLEMS °· UNUSUAL RASH °Items with * indicate a potential emergency and should be followed up as soon as possible. ° °Feel free to call the clinic should you have any questions or concerns. The clinic phone number is [phone]. ° °Please show the CHEMO ALERT CARD at check-in to the Emergency Department and triage nurse. ° ° °

## 2020-03-07 NOTE — Assessment & Plan Note (Signed)
Renal function is stable. Monitor closely She was seen by nephrologist recently 

## 2020-03-07 NOTE — Assessment & Plan Note (Signed)
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 

## 2020-03-07 NOTE — Assessment & Plan Note (Signed)
She has been getting chronic IV magnesium replacement therapy According to the patient, her nephrologist felt that it is not necessary at this point As long as her potassium level is good, she does not need IV magnesium replacement

## 2020-03-07 NOTE — Assessment & Plan Note (Signed)
We discussed the importance of social distancing even when an individual is fully vaccinated She is interested for the booster vaccine but we do not give out more than a booster vaccine now I recommend she gets it from her local pharmacy She will be eligible for influenza vaccination next month

## 2020-03-21 DIAGNOSIS — R69 Illness, unspecified: Secondary | ICD-10-CM | POA: Diagnosis not present

## 2020-03-23 DIAGNOSIS — H35372 Puckering of macula, left eye: Secondary | ICD-10-CM | POA: Diagnosis not present

## 2020-03-23 DIAGNOSIS — H3581 Retinal edema: Secondary | ICD-10-CM | POA: Diagnosis not present

## 2020-03-23 DIAGNOSIS — H338 Other retinal detachments: Secondary | ICD-10-CM | POA: Diagnosis not present

## 2020-03-23 DIAGNOSIS — H59812 Chorioretinal scars after surgery for detachment, left eye: Secondary | ICD-10-CM | POA: Diagnosis not present

## 2020-03-23 DIAGNOSIS — H35363 Drusen (degenerative) of macula, bilateral: Secondary | ICD-10-CM | POA: Diagnosis not present

## 2020-03-27 ENCOUNTER — Encounter (HOSPITAL_COMMUNITY): Payer: Self-pay

## 2020-03-27 ENCOUNTER — Ambulatory Visit (HOSPITAL_COMMUNITY)
Admission: RE | Admit: 2020-03-27 | Discharge: 2020-03-27 | Disposition: A | Discharge status: Home / Self Care | Payer: Medicare HMO | Source: Ambulatory Visit | Attending: Hematology and Oncology | Admitting: Hematology and Oncology

## 2020-03-27 ENCOUNTER — Other Ambulatory Visit: Payer: Self-pay

## 2020-03-27 DIAGNOSIS — C541 Malignant neoplasm of endometrium: Secondary | ICD-10-CM | POA: Insufficient documentation

## 2020-03-27 DIAGNOSIS — K7689 Other specified diseases of liver: Secondary | ICD-10-CM | POA: Diagnosis not present

## 2020-03-27 DIAGNOSIS — I700 Atherosclerosis of aorta: Secondary | ICD-10-CM | POA: Diagnosis not present

## 2020-03-27 DIAGNOSIS — K5730 Diverticulosis of large intestine without perforation or abscess without bleeding: Secondary | ICD-10-CM | POA: Diagnosis not present

## 2020-03-27 MED ORDER — IOHEXOL 300 MG/ML  SOLN
100.0000 mL | Freq: Once | INTRAMUSCULAR | Status: AC | PRN
  Administered 2020-03-27: 75 mL via INTRAVENOUS

## 2020-03-28 ENCOUNTER — Other Ambulatory Visit: Payer: Self-pay

## 2020-03-28 ENCOUNTER — Encounter: Payer: Self-pay | Admitting: Hematology and Oncology

## 2020-03-28 ENCOUNTER — Inpatient Hospital Stay: Payer: Medicare HMO | Attending: Gynecologic Oncology

## 2020-03-28 ENCOUNTER — Inpatient Hospital Stay: Payer: Medicare HMO

## 2020-03-28 ENCOUNTER — Telehealth: Payer: Self-pay

## 2020-03-28 ENCOUNTER — Inpatient Hospital Stay: Payer: Medicare HMO | Admitting: Hematology and Oncology

## 2020-03-28 VITALS — BP 149/62 | HR 73 | Resp 18 | Ht 62.2 in | Wt 135.8 lb

## 2020-03-28 DIAGNOSIS — C541 Malignant neoplasm of endometrium: Secondary | ICD-10-CM

## 2020-03-28 DIAGNOSIS — Z90710 Acquired absence of both cervix and uterus: Secondary | ICD-10-CM | POA: Diagnosis not present

## 2020-03-28 DIAGNOSIS — Z79899 Other long term (current) drug therapy: Secondary | ICD-10-CM | POA: Diagnosis not present

## 2020-03-28 DIAGNOSIS — N183 Chronic kidney disease, stage 3 unspecified: Secondary | ICD-10-CM

## 2020-03-28 DIAGNOSIS — D631 Anemia in chronic kidney disease: Secondary | ICD-10-CM | POA: Diagnosis not present

## 2020-03-28 DIAGNOSIS — Z5112 Encounter for antineoplastic immunotherapy: Secondary | ICD-10-CM | POA: Diagnosis not present

## 2020-03-28 DIAGNOSIS — Z7982 Long term (current) use of aspirin: Secondary | ICD-10-CM | POA: Insufficient documentation

## 2020-03-28 DIAGNOSIS — Z5111 Encounter for antineoplastic chemotherapy: Secondary | ICD-10-CM | POA: Diagnosis not present

## 2020-03-28 DIAGNOSIS — D630 Anemia in neoplastic disease: Secondary | ICD-10-CM

## 2020-03-28 DIAGNOSIS — C779 Secondary and unspecified malignant neoplasm of lymph node, unspecified: Secondary | ICD-10-CM | POA: Diagnosis present

## 2020-03-28 DIAGNOSIS — E876 Hypokalemia: Secondary | ICD-10-CM | POA: Diagnosis not present

## 2020-03-28 LAB — CBC WITH DIFFERENTIAL/PLATELET
Abs Immature Granulocytes: 0.04 10*3/uL (ref 0.00–0.07)
Basophils Absolute: 0.1 10*3/uL (ref 0.0–0.1)
Basophils Relative: 1 %
Eosinophils Absolute: 0.3 10*3/uL (ref 0.0–0.5)
Eosinophils Relative: 6 %
HCT: 30.8 % — ABNORMAL LOW (ref 36.0–46.0)
Hemoglobin: 10.6 g/dL — ABNORMAL LOW (ref 12.0–15.0)
Immature Granulocytes: 1 %
Lymphocytes Relative: 25 %
Lymphs Abs: 1.3 10*3/uL (ref 0.7–4.0)
MCH: 30.9 pg (ref 26.0–34.0)
MCHC: 34.4 g/dL (ref 30.0–36.0)
MCV: 89.8 fL (ref 80.0–100.0)
Monocytes Absolute: 0.5 10*3/uL (ref 0.1–1.0)
Monocytes Relative: 9 %
Neutro Abs: 3 10*3/uL (ref 1.7–7.7)
Neutrophils Relative %: 58 %
Platelets: 190 10*3/uL (ref 150–400)
RBC: 3.43 MIL/uL — ABNORMAL LOW (ref 3.87–5.11)
RDW: 12.3 % (ref 11.5–15.5)
WBC: 5.3 10*3/uL (ref 4.0–10.5)
nRBC: 0 % (ref 0.0–0.2)

## 2020-03-28 LAB — COMPREHENSIVE METABOLIC PANEL
ALT: 13 U/L (ref 0–44)
AST: 19 U/L (ref 15–41)
Albumin: 3.6 g/dL (ref 3.5–5.0)
Alkaline Phosphatase: 68 U/L (ref 38–126)
Anion gap: 11 (ref 5–15)
BUN: 19 mg/dL (ref 8–23)
CO2: 24 mmol/L (ref 22–32)
Calcium: 8.8 mg/dL — ABNORMAL LOW (ref 8.9–10.3)
Chloride: 103 mmol/L (ref 98–111)
Creatinine, Ser: 1.26 mg/dL — ABNORMAL HIGH (ref 0.44–1.00)
GFR calc Af Amer: 47 mL/min — ABNORMAL LOW (ref >60)
GFR calc non Af Amer: 40 mL/min — ABNORMAL LOW (ref >60)
Glucose, Bld: 106 mg/dL — ABNORMAL HIGH (ref 70–99)
Potassium: 3.3 mmol/L — ABNORMAL LOW (ref 3.5–5.1)
Sodium: 138 mmol/L (ref 135–145)
Total Bilirubin: 0.3 mg/dL (ref 0.3–1.2)
Total Protein: 6.6 g/dL (ref 6.5–8.1)

## 2020-03-28 MED ORDER — DIPHENHYDRAMINE HCL 25 MG PO TABS
25.0000 mg | ORAL_TABLET | Freq: Once | ORAL | Status: AC
  Administered 2020-03-28: 25 mg via ORAL
  Filled 2020-03-28: qty 1

## 2020-03-28 MED ORDER — SODIUM CHLORIDE 0.9 % IV SOLN
Freq: Once | INTRAVENOUS | Status: AC
  Filled 2020-03-28: qty 250

## 2020-03-28 MED ORDER — DIPHENHYDRAMINE HCL 25 MG PO CAPS
ORAL_CAPSULE | ORAL | Status: AC
  Filled 2020-03-28: qty 1

## 2020-03-28 MED ORDER — TRASTUZUMAB-DKST CHEMO 150 MG IV SOLR
6.0000 mg/kg | Freq: Once | INTRAVENOUS | Status: AC
  Administered 2020-03-28: 357 mg via INTRAVENOUS
  Filled 2020-03-28: qty 17

## 2020-03-28 MED ORDER — ALTEPLASE 2 MG IJ SOLR
INTRAMUSCULAR | Status: AC
  Filled 2020-03-28: qty 2

## 2020-03-28 MED ORDER — ALTEPLASE 2 MG IJ SOLR
2.0000 mg | Freq: Once | INTRAMUSCULAR | Status: AC
  Administered 2020-03-28: 2 mg
  Filled 2020-03-28: qty 2

## 2020-03-28 MED ORDER — ACETAMINOPHEN 325 MG PO TABS
ORAL_TABLET | ORAL | Status: AC
  Filled 2020-03-28: qty 2

## 2020-03-28 MED ORDER — HEPARIN SOD (PORK) LOCK FLUSH 100 UNIT/ML IV SOLN
500.0000 [IU] | Freq: Once | INTRAVENOUS | Status: AC | PRN
  Administered 2020-03-28: 500 [IU]
  Filled 2020-03-28: qty 5

## 2020-03-28 MED ORDER — ACETAMINOPHEN 325 MG PO TABS
650.0000 mg | ORAL_TABLET | Freq: Once | ORAL | Status: AC
  Administered 2020-03-28: 650 mg via ORAL

## 2020-03-28 MED ORDER — SODIUM CHLORIDE 0.9% FLUSH
10.0000 mL | Freq: Once | INTRAVENOUS | Status: AC
  Administered 2020-03-28: 10 mL
  Filled 2020-03-28: qty 10

## 2020-03-28 MED ORDER — SODIUM CHLORIDE 0.9% FLUSH
10.0000 mL | INTRAVENOUS | Status: DC | PRN
  Administered 2020-03-28: 10 mL
  Filled 2020-03-28: qty 10

## 2020-03-28 NOTE — Telephone Encounter (Signed)
-----   Message from [name], MD sent at 03/28/2020 10:22 AM EDT ----- Regarding: pls schedule echo for 10/4

## 2020-03-28 NOTE — Progress Notes (Signed)
CATHFLO  Given by Dondra Prader, RN at [phone]. I drew labs peripherally.

## 2020-03-28 NOTE — Assessment & Plan Note (Signed)
Renal function is stable. Monitor closely She was seen by nephrologist recently 

## 2020-03-28 NOTE — Progress Notes (Signed)
[place] OFFICE PROGRESS NOTE  Patient Care Team: Vicki Bunting, [name]  ASSESSMENT & PLAN:  Endometrial cancer [facility]) I have reviewed CT imaging with the patient She has excellent response to therapy The plan will be to continue [name] indefinitely I will order echocardiogram before her treatment in October I recommend CT imaging every 6 months, next due around March 2022  CKD (chronic kidney disease), stage III ([place]) Renal function is stable. Monitor closely She was seen by nephrologist recently  Anemia in neoplastic disease She has multifactorial anemia, anemia secondary to prior chemotherapy as well as chronic kidney disease Anemia is improving/stable Recent vitamin B12 level was adequate She is not symptomatic Observe only  Hypomagnesemia She has history of hypomagnesemia We have stopped giving her IV magnesium for a while She is mildly hypokalemic today I plan to resume checking her magnesium level in her next visit and replace as needed   Orders Placed This Encounter  Procedures  . Magnesium    Standing Status:   Standing    Number of Occurrences:   2    Standing Expiration Date:   03/28/2021  . ECHOCARDIOGRAM COMPLETE    Standing Status:   Future    Standing Expiration Date:   03/28/2021    Order Specific Question:   Where should this test be performed    Answer:   [place]    Order Specific Question:   Perflutren DEFINITY (image enhancing agent) should be administered unless hypersensitivity or allergy exist    Answer:   Administer Perflutren    Order Specific Question:   Reason for exam-Echo    Answer:   Chemo  V67.2 / Z09    All questions were answered. The patient knows to call the clinic with any problems, questions or concerns. The total time spent in the appointment was 20 minutes encounter with patients including review of chart and various tests results, discussions about plan of care and  coordination of care plan   Vicki Lark, MD 03/28/2020 10:25 AM  INTERVAL HISTORY: Please see below for problem oriented charting. She returns for further follow-up She is doing well No side effects from [name] No recent infection, fever or chills  SUMMARY OF ONCOLOGIC HISTORY: Oncology History Overview Note  Vicki Cervantes  has a remote history of left  breast cancer at age 73 but received BRCA testing approximately 5 years ago which was negative. Her cancer was treated with surgery but no radiation or chemotherapy.   Serous endometrial cancer, MSI stable ER positive, PR neg, Her2/neu 3+      Endometrial cancer ([place])  08/29/2016 Pathology Results   Endometrium, biopsy - HIGH GRADE ENDOMETRIAL CARCINOMA, SEE COMMENT. Microscopic Comment The sections show multiple fragments of adenocarcinoma displaying glandular and papillary patterns associated with high grade cytomorphology characterized by nuclear pleomorphism, prominent nucleoli and brisk mitosis. Immunohistochemical stains show that the tumor cells are positive for vimentin, p16, p53 with increased Ki-67 expression. Estrogen and progesterone receptor stains show patchy weak positivity. No significant positivity is seen with CEA. The findings are consistent with high grade endometrial carcinoma and the overall morphology and phenotypic features favor serous carcinoma.   08/29/2016 Initial Diagnosis   She presented with postmenopausal bleeding   10/03/2016 Imaging   CT C/A/P 09/2016 IMPRESSION: 1. Thickening of the endometrial canal up to 19 mm in fundus, presumably corresponding to the patient's reported endometrial carcinoma. 2. Multiple tiny pulmonary nodules scattered throughout the lungs  bilaterally measuring 4 mm or less in size. Nodules of this size are typically considered statistically likely benign. In the setting of known primary malignancy, metastatic disease to the lungs is not excluded, but is not strongly favored on  today's examination. Attention on followup studies is recommended to ensure the stability or resolution of these nodules. 3. Subcentimeter low-attenuation lesion in the central aspect of segment 8 of the liver is too small to characterize. This is statistically likely a tiny cyst, but warrants attention on follow-up studies to exclude the possibility of a solitary hepatic metastasis. 4. 1.5 x 1.5 x 1.7 cm well-circumscribed lesion in the proximal stomach. This is of uncertain etiology and significance, and could represent a benign gastric polyp, however, further evaluation with nonemergent endoscopy is suggested in the near future for further evaluation. 5. **An incidental finding of potential clinical significance has been found. 1.1 x 1.6 cm thyroid nodule in the inferior aspect of the right lobe of the thyroid gland. Follow-up evaluation with nonemergent thyroid ultrasound is recommended in the near future to better evaluate this finding. This recommendation follows ACR consensuss guidelines: Managing Incidental Thyroid Nodules Detected on Imaging: White Paper of the ACR Incidental Thyroid Findings Committee. J Am Coll Radiol 2015;12(2):143-150.** 6. Aortic atherosclerosis, in addition to left anterior descending coronary artery disease   10/22/2016 Surgery   Robotic assisted total hysterectomy, BSO and bilateral pelvic lymphadenectomy  Final pathology revealed a 3cm polyp containing serous carcinoma but with no myometrial invasion, no LVSI and negative nodes.  Stage IA Uterine serous cancer   10/22/2016 Pathology Results   1. Lymph nodes, regional resection, right pelvic - SIX BENIGN LYMPH NODES (0/6). 2. Lymph nodes, regional resection, left pelvic - SEVEN BENIGN LYMPH NODES (0/7). 3. Uterus +/- tubes/ovaries, neoplastic, with right ovary and fallopian tube ENDOMYOMETRIUM - SEROUS CARCINOMA ARISING WITHIN AN ENDOMETRIAL POLYP - NO MYOMETRIAL INVASION IDENTIFIED - ADENOMYOSIS - LEIOMYOMA (1  CM) - SEE ONCOLOGY TABLE AND COMMENT CERVIX - CARCINOMA FOCALLY INVOLVES ENDOCERVICAL GLANDS - NABOTHIAN CYSTS RIGHT ADNEXA - BENIGN OVARY AND FALLOPIAN TUBE - NO CARCINOMA IDENTIFIED 4. Cul-de-sac biopsy - MESOTHELIAL HYPERPLASIA Microscopic Comment 3. ONCOLOGY TABLE-UTERUS, CARCINOMA OR CARCINOSARCOMA Specimen: Uterus, right fallopian tube and ovary Procedure: Total hysterectomy and right salpingo-oophorectomy Lymph node sampling performed: Bilateral pelvic regional resection Specimen integrity: Intact Maximum tumor size: 3 cm (polyp) Histologic type: Serous carcinoma Grade: High grade Myometrial invasion: Not identified Cervical stromal involvement: No, focal endocervical gland involvement Extent of involvement of other organs: Not identified Lymph - vascular invasion: Not identified Peritoneal washings: N/A Lymph nodes: Examined: 0 Sentinel 13 Non-sentinel 13 Total Lymph nodes with metastasis: 0 Isolated tumor cells (< 0.2 mm): 0 Micrometastasis: (> 0.2 mm and < 2.0 mm): 0 Macrometastasis: (> 2.0 mm): 0 Extracapsular extension: N/A Pelvic lymph nodes: 0 involved of 13 lymph nodes. Para-aortic lymph nodes: No para-aortic nodes submitted TNM code: pT1a, pNX FIGO Stage (based on pathologic findings, needs clinical correlation): IA Comment: Immunohistochemistry for cytokeratin AE1/AE3 is performed on all of the lymph nodes (parts 1 & 2) and no metastatic carcinoma is identified.   07/04/2017 Genetic Testing   The patient had genetic testing due to a personal history of breast and uterine cancer, and a family history of stomach cancer.  The Multi-Cancer Panel was ordered. The Multi-Cancer Panel offered by [facility] includes sequencing and/or deletion duplication testing of the following 83 genes: ALK, APC, ATM, AXIN2,BAP1,  BARD1, BLM, BMPR1A, BRCA1, BRCA2, BRIP1, CASR, CDC73, CDH1, CDK4, CDKN1B, CDKN1C, CDKN2A (  p14ARF), CDKN2A (p16INK4a), CEBPA, CHEK2, CTNNA1, DICER1, DIS3L2,  EGFR (c.2369C>T, p.Thr790Met variant only), EPCAM (Deletion/duplication testing only), FH, FLCN, GATA2, GPC3, GREM1 (Promoter region deletion/duplication testing only), HOXB13 (c.251G>A, p.Gly84Glu), HRAS, KIT, MAX, MEN1, MET, MITF (c.952G>A, p.Glu318Lys variant only), MLH1, MSH2, MSH3, MSH6, MUTYH, NBN, NF1, NF2, NTHL1, PALB2, PDGFRA, PHOX2B, PMS2, POLD1, POLE, POT1, PRKAR1A, PTCH1, PTEN, RAD50, RAD51C, RAD51D, RB1, RECQL4, RET, RUNX1, SDHAF2, SDHA (sequence changes only), SDHB, SDHC, SDHD, SMAD4, SMARCA4, SMARCB1, SMARCE1, STK11, SUFU, TERC, TERT, TMEM127, TP53, TSC1, TSC2, VHL, WRN and WT1.   Results: No pathogenic variants were identified.  A variant of uncertain significance in the gene APC was identified.  c.791A>G (p.Gln264Arg).  The date of this test report is 07/04/2017.     Genetic Testing   Patient has genetic testing done for MSI. 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    12/03/2017 Imaging   CT Chest/Abd/Pelvis to follow pulmonary nodule and gastric mass IMPRESSION: 1. Stable CT of the chest. Small pulmonary nodules are unchanged when compared with previous exam. 2. No new findings identified. 3. Subcentimeter low-attenuation lesions within the liver are remain too small to characterize but are stable from prior exam. 4. Persistent indeterminate low-attenuation structure within the proximal stomach is unchanged measuring 1.4 cm. Correlation with direct visualization is advised   06/30/2018 Imaging   CT CHEST Lungs/Pleura: Stable scattered sub-cm pulmonary nodules are again seen bilaterally and are stable compared to previous studies. No new or enlarging pulmonary nodules or masses identified. No evidence of pulmonary infiltrate or pleural effusion.   08/19/2018 Echocardiogram   [place] done in [place]: EF 60-65%. Mild impaired relaxation. (report scanned)   09/17/2018 Relapse/Recurrence   Presented with c/o vagina discharge.  Lesion noted at the left  vaginal apex 34m lesion removed Path c/w high grade serous cancer   09/17/2018 Pathology Results   Vagina, biopsy, left apex - HIGH GRADE SEROUS CARCINOMA.   09/28/2018 PET scan   1. Two hypermetabolic axial lymph nodes. Unusual site for metastatic endometrial carcinoma however the activity is more intense than typically seen in reactive adenopathy. Suggest ultrasound-guided percutaneous biopsy of the larger RIGHT axial lymph node 2. No evidence of local recurrence at the vaginal cuff.  3. No metastatic adenopathy in the abdomen or pelvis. 4. Stable small pulmonary nodules   10/09/2018 Pathology Results   Lymph node, needle/core biopsy, right axilla - METASTATIC CARCINOMA, SEE COMMENT. Microscopic Comment The carcinoma appears high grade. Immunohistochemistry is positive for cytokeratin 7, PAX-8, and ER. Cytokeratin 20, CDX-2, PR, and GATA-3 are negative. The findings along with the history are consistent with a gynecologic primary.    10/09/2018 Procedure   Ultrasound-guided core biopsies of a right axillary lymph node.   10/14/2018 Cancer Staging   Staging form: Corpus Uteri - Carcinoma and Carcinosarcoma, AJCC 8th Edition - Clinical: Stage IVB (cT1, cN0, pM1) - Signed by GHeath Lark MD on 10/14/2018   10/19/2018 Imaging   Placement of single lumen port a cath via right internal jugular vein. The catheter tip lies at the cavo-atrial junction. A power injectable port a cath was placed and is ready for immediate use.    10/21/2018 -  Chemotherapy   The patient had carboplatin and taxol for treatment   11/11/2018 -  Chemotherapy   The patient had trastuzumab ([name]) 450 mg in sodium chloride 0.9 % 250 mL chemo infusion, 483 mg, Intravenous,  Once, 6 of 6 cycles Administration: 450 mg (11/11/2018), 378 mg (12/02/2018), 378 mg (  02/03/2019), 378 mg (12/23/2018), 378 mg (01/13/2019), 378 mg (02/24/2019) trastuzumab-dkst (OGIVRI) 378 mg in sodium chloride 0.9 % 250 mL chemo infusion, 6 mg/kg = 378 mg  (100 % of original dose 6 mg/kg), Intravenous,  Once, 18 of 20 cycles Dose modification: 6 mg/kg (original dose 6 mg/kg, Cycle 7, Reason: Other (see comments)) Administration: 378 mg (03/24/2019), 378 mg (04/14/2019), 378 mg (05/05/2019), 378 mg (05/26/2019), 378 mg (06/16/2019), 378 mg (07/06/2019), 357 mg (07/28/2019), 357 mg (08/18/2019), 357 mg (09/07/2019), 357 mg (09/28/2019), 357 mg (10/19/2019), 357 mg (11/09/2019), 357 mg (11/30/2019), 357 mg (12/21/2019), 357 mg (01/20/2020), 357 mg (02/15/2020), 357 mg (03/07/2020)  for chemotherapy treatment.    12/21/2018 PET scan   1. Interval resolution of hypermetabolic right axillary lymph nodes. No metabolic findings highly suspicious for recurrent metastatic disease. 2. New mild hypermetabolism within a borderline prominent portacaval lymph node, nonspecific. While a reactive node is favored, a lymph node metastasis cannot be entirely excluded. Suggest attention to this lymph node on follow-up PET-CT in 3-6 months. 3. Nonspecific new hypermetabolism at the ileocecal valve, more likely physiologic given absence of CT correlate. 4. Scattered subcentimeter pulmonary nodules are all stable and below PET resolution, more likely benign, continued CT surveillance advised. 5. Chronic findings include: Aortic Atherosclerosis (ICD10-I70.0). Marked diffuse colonic diverticulosis. Coronary atherosclerosis.    12/28/2018 Echocardiogram   1. The left ventricle has normal systolic function with an ejection fraction of 60-65%. The cavity size was normal. Left ventricular diastolic Doppler parameters are consistent with impaired relaxation.  2. GLS recorded as -10.7 but LV appears hyperdynamic and tracking of endocardium appears poor.  3. The right ventricle has normal systolic function. The cavity was normal. There is no increase in right ventricular wall thickness.  4. Mild thickening of the mitral valve leaflet.  5. The aortic valve was not well visualized. Mild thickening of the  aortic valve. Aortic valve regurgitation is trivial by color flow Doppler.   03/23/2019 Imaging   PET 1. No findings of hypermetabolic residual/recurrent or metastatic disease. 2. Similar low-level hypermetabolism within normal sized portocaval and right inguinal nodes, favored to be reactive. 3. Ongoing stability of small bilateral pulmonary nodules, favored to be benign. Below PET resolution. 4. Coronary artery atherosclerosis. Aortic Atherosclerosis   03/26/2019 Echocardiogram   1. The left ventricle has normal systolic function, with an ejection fraction of 55-60%. The cavity size was normal. Left ventricular diastolic Doppler parameters are consistent with impaired relaxation.  2. The right ventricle has normal systolic function. The cavity was normal.  3. The mitral valve is abnormal. Mild thickening of the mitral valve leaflet. There is mild mitral annular calcification present.  4. The tricuspid valve is grossly normal.  5. The aortic valve is tricuspid. Mild calcification of the aortic valve. No stenosis of the aortic valve.  6. The aorta is normal unless otherwise noted.  7. Normal LV systolic function; grade 1 diastolic dysfunction; ONG-29.5%.   06/30/2019 Imaging   Chest Impression:   1. No evidence thoracic metastasis. 2. Stable small benign-appearing pulmonary nodules   Abdomen / Pelvis Impression:   1. No evidence of metastatic disease in the abdomen pelvis. 2.  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 3.  Aortic Atherosclerosis (ICD10-I70.0).   06/30/2019 Echocardiogram    1. Left ventricular ejection fraction, by visual estimation, is 60 to 65%. The left ventricle has normal function. There is no left ventricular hypertrophy.  2. Left ventricular diastolic parameters are consistent with Grade I diastolic dysfunction (impaired  relaxation).  3. The left ventricle has no regional wall motion abnormalities.  4. Global right ventricle has normal systolic  function.The right ventricular size is normal. No increase in right ventricular wall thickness.  5. Left atrial size was mild-moderately dilated.  6. Right atrial size was normal.  7. The mitral valve is normal in structure. Trivial mitral valve regurgitation.  8. The tricuspid valve is normal in structure. Tricuspid valve regurgitation is trivial.  9. The aortic valve is normal in structure. Aortic valve regurgitation is trivial. No evidence of aortic valve sclerosis or stenosis. 10. The pulmonic valve was grossly normal. Pulmonic valve regurgitation is not visualized. 11. Mildly elevated pulmonary artery systolic pressure. 12. The inferior vena cava is normal in size with greater than 50% respiratory variability, suggesting right atrial pressure of 3 mmHg. 13. The average left ventricular global longitudinal strain is -12.5 %. GLS underestimated due to poor endocardial tracking.     09/27/2019 Imaging   1. Multiple small, nonspecific pulmonary nodules are again noted. The previously noted lung nodules are unchanged in size from previous exam. There is a new lung nodule within the medial right lower lobe which is nonspecific measure 4 mm. Attention at follow-up imaging is recommended. 2. No evidence of metastatic disease within the abdomen or pelvis. 3. Coronary artery calcifications.  The 4.  Aortic Atherosclerosis (ICD10-I70.0).   09/30/2019 Echocardiogram    1. Left ventricular ejection fraction, by estimation, is 60 to 65%. The left ventricle has normal function. The left ventricle has no regional wall motion abnormalities. Left ventricular diastolic parameters are consistent with Grade I diastolic dysfunction (impaired relaxation).  2. Right ventricular systolic function is normal. The right ventricular size is normal. There is normal pulmonary artery systolic pressure. The estimated right ventricular systolic pressure is 56.9 mmHg.  3. The mitral valve is normal in structure. No evidence of  mitral valve regurgitation. No evidence of mitral stenosis.  4. The aortic valve is normal in structure. Aortic valve regurgitation is trivial. No aortic stenosis is present.  5. The inferior vena cava is normal in size with greater than 50% respiratory variability, suggesting right atrial pressure of 3 mmHg.     01/07/2020 Echocardiogram    1. Normal LV function; grade 1 diastolic dysfunction; mild AI; GLS -21%.  2. Left ventricular ejection fraction, by estimation, is 55 to 60%. The left ventricle has normal function. The left ventricle has no regional wall motion abnormalities. Left ventricular diastolic parameters are consistent with Grade I diastolic dysfunction (impaired relaxation).  3. Right ventricular systolic function is normal. The right ventricular size is normal.  4. The mitral valve is normal in structure. Trivial mitral valve regurgitation. No evidence of mitral stenosis.  5. The aortic valve is tricuspid. Aortic valve regurgitation is mild. Mild aortic valve sclerosis is present, with no evidence of aortic valve stenosis.  6. The inferior vena cava is normal in size with greater than 50% respiratory variability, suggesting right atrial pressure of 3 mmHg.     03/27/2020 Imaging   1. Status post hysterectomy. No evidence of recurrent or metastatic disease in the abdomen or pelvis. 2. Multiple small pulmonary nodules in the lung bases are unchanged to the extent that they are included on current examination and remain most likely sequelae of prior infection or inflammation. Attention on follow-up. 3. Aortic Atherosclerosis (ICD10-I70.0).   Metastasis to lymph nodes ([place])  10/14/2018 Initial Diagnosis   Metastasis to lymph nodes ([place])   10/21/2018 - 03/16/2019 Chemotherapy  The patient had palonosetron (ALOXI) injection 0.25 mg, 0.25 mg, Intravenous,  Once, 7 of 7 cycles Administration: 0.25 mg (10/21/2018), 0.25 mg (11/11/2018), 0.25 mg (12/02/2018), 0.25 mg (12/23/2018), 0.25 mg  (01/13/2019), 0.25 mg (02/03/2019), 0.25 mg (02/24/2019) CARBOplatin (PARAPLATIN) 310 mg in sodium chloride 0.9 % 250 mL chemo infusion, 310 mg, Intravenous,  Once, 7 of 7 cycles Dose modification: 300 mg (original dose 311.5 mg, Cycle 4, Reason: Dose not tolerated) Administration: 310 mg (10/21/2018), 300 mg (11/11/2018), 300 mg (12/02/2018), 300 mg (12/23/2018), 300 mg (01/13/2019), 300 mg (02/03/2019), 300 mg (02/24/2019) PACLitaxel (TAXOL) 222 mg in sodium chloride 0.9 % 250 mL chemo infusion (> 65m/m2), 135 mg/m2 = 222 mg, Intravenous,  Once, 7 of 7 cycles Administration: 222 mg (10/21/2018), 222 mg (11/11/2018), 222 mg (12/02/2018), 222 mg (12/23/2018), 222 mg (01/13/2019), 222 mg (02/03/2019), 222 mg (02/24/2019) fosaprepitant (EMEND) 150 mg, dexamethasone (DECADRON) 12 mg in sodium chloride 0.9 % 145 mL IVPB, , Intravenous,  Once, 7 of 7 cycles Administration:  (10/21/2018),  (11/11/2018),  (12/02/2018),  (12/23/2018),  (01/13/2019),  (02/03/2019),  (02/24/2019)  for chemotherapy treatment.      REVIEW OF SYSTEMS:   Constitutional: Denies fevers, chills or abnormal weight loss Eyes: Denies blurriness of vision Ears, nose, mouth, throat, and face: Denies mucositis or sore throat Respiratory: Denies cough, dyspnea or wheezes Cardiovascular: Denies palpitation, chest discomfort or lower extremity swelling Gastrointestinal:  Denies nausea, heartburn or change in bowel habits Skin: Denies abnormal skin rashes Lymphatics: Denies new lymphadenopathy or easy bruising Neurological:Denies numbness, tingling or new weaknesses Behavioral/Psych: Mood is stable, no new changes  All other systems were reviewed with the patient and are negative.  I have reviewed the past medical history, past surgical history, social history and family history with the patient and they are unchanged from previous note.  ALLERGIES:  has No Known Allergies.  MEDICATIONS:  Current Outpatient Medications  Medication Sig Dispense Refill  .  acetaminophen (TYLENOL) 500 MG tablet Take 1,000 mg by mouth every 6 (six) hours as needed.    .Marland Kitchenaspirin EC 81 MG tablet Take 81 mg by mouth daily.    .Marland Kitchenatorvastatin (LIPITOR) 20 MG tablet Take 20 mg by mouth every evening.     . Calcium Carbonate-Vitamin D (CALCIUM-VITAMIN D) 500-200 MG-UNIT per tablet Take 1 tablet by mouth daily.    . citalopram (CELEXA) 20 MG tablet Take 20 mg by mouth at bedtime.     .Marland Kitchenestradiol (ESTRACE VAGINAL) 0.1 MG/GM vaginal cream Place 1 Applicatorful vaginally 3 (three) times a week. 42.5 g 12  . gabapentin (NEURONTIN) 600 MG tablet Take 600 mg by mouth at bedtime as needed.    .Marland Kitchenlevothyroxine (SYNTHROID, LEVOTHROID) 100 MCG tablet Take 100 mcg by mouth daily before breakfast.     . lidocaine-prilocaine (EMLA) cream Apply to affected area once 30 g 3  . losartan-hydrochlorothiazide (HYZAAR) 100-25 MG tablet     . magnesium oxide (MAG-OX) 400 (241.3 Mg) MG tablet Take 1 tablet (400 mg total) by mouth daily. 30 tablet 11  . metoprolol succinate (TOPROL-XL) 25 MG 24 hr tablet Take 25 mg by mouth daily.    . Multiple Vitamin (MULTIVITAMIN WITH MINERALS) TABS Take 1 tablet by mouth daily.    . ondansetron (ZOFRAN) 8 MG tablet Take 1 tablet (8 mg total) by mouth every 8 (eight) hours as needed. Start on the third day after chemotherapy. 30 tablet 1  . pantoprazole (PROTONIX) 40 MG tablet Take 40 mg by mouth every  morning.  (Patient not taking: Reported on 02/15/2020)    . valACYclovir (VALTREX) 1000 MG tablet valacyclovir 1 gram tablet     No current facility-administered medications for this visit.   Facility-Administered Medications Ordered in Other Visits  Medication Dose Route Frequency Provider Last Rate Last Admin  . 0.9 %  sodium chloride infusion   Intravenous Once Alvy Bimler, Abigaile Rossie, MD      . acetaminophen (TYLENOL) tablet 650 mg  650 mg Oral Once Alvy Bimler, Sharunda Salmon, MD      . diphenhydrAMINE (BENADRYL) tablet 25 mg  25 mg Oral Once Alvy Bimler, Taggert Bozzi, MD      . heparin lock flush  100 unit/mL  500 Units Intracatheter Once PRN Alvy Bimler, Gabrial Domine, MD      . sodium chloride flush (NS) 0.9 % injection 10 mL  10 mL Intracatheter PRN Briton Sellman, MD      . trastuzumab-dkst (OGIVRI) 357 mg in sodium chloride 0.9 % 250 mL chemo infusion  6 mg/kg (Treatment Plan Recorded) Intravenous Once Alvy Bimler, Donalda Job, MD        PHYSICAL EXAMINATION: ECOG PERFORMANCE STATUS: 1 - Symptomatic but completely ambulatory  Vitals:   03/28/20 0952  BP: (!) 149/62  Pulse: 73  Resp: 18  SpO2: 99%   Filed Weights   03/28/20 0952  Weight: 135 lb 12.8 oz (61.6 kg)    GENERAL:alert, no distress and comfortable NEURO: alert & oriented x 3 with fluent speech, no focal motor/sensory deficits  LABORATORY DATA:  I have reviewed the data as listed    Component Value Date/Time   NA 138 03/28/2020 0933   NA 139 02/13/2017 1512   K 3.3 (L) 03/28/2020 0933   K 4.2 02/13/2017 1512   CL 103 03/28/2020 0933   CO2 24 03/28/2020 0933   CO2 28 02/13/2017 1512   GLUCOSE 106 (H) 03/28/2020 0933   GLUCOSE 118 02/13/2017 1512   BUN 19 03/28/2020 0933   BUN 25.2 02/13/2017 1512   CREATININE 1.26 (H) 03/28/2020 0933   CREATININE 1.37 (H) 12/21/2019 0850   CREATININE 1.2 (H) 02/13/2017 1512   CALCIUM 8.8 (L) 03/28/2020 0933   CALCIUM 10.0 02/13/2017 1512   PROT 6.6 03/28/2020 0933   ALBUMIN 3.6 03/28/2020 0933   AST 19 03/28/2020 0933   AST 19 12/21/2019 0850   ALT 13 03/28/2020 0933   ALT 13 12/21/2019 0850   ALKPHOS 68 03/28/2020 0933   BILITOT 0.3 03/28/2020 0933   BILITOT 0.4 12/21/2019 0850   GFRNONAA 40 (L) 03/28/2020 0933   GFRNONAA 37 (L) 12/21/2019 0850   GFRAA 47 (L) 03/28/2020 0933   GFRAA 42 (L) 12/21/2019 0850    No results found for: SPEP, UPEP  Lab Results  Component Value Date   WBC 5.3 03/28/2020   NEUTROABS 3.0 03/28/2020   HGB 10.6 (L) 03/28/2020   HCT 30.8 (L) 03/28/2020   MCV 89.8 03/28/2020   PLT 190 03/28/2020      Chemistry      Component Value Date/Time   NA 138  03/28/2020 0933   NA 139 02/13/2017 1512   K 3.3 (L) 03/28/2020 0933   K 4.2 02/13/2017 1512   CL 103 03/28/2020 0933   CO2 24 03/28/2020 0933   CO2 28 02/13/2017 1512   BUN 19 03/28/2020 0933   BUN 25.2 02/13/2017 1512   CREATININE 1.26 (H) 03/28/2020 0933   CREATININE 1.37 (H) 12/21/2019 0850   CREATININE 1.2 (H) 02/13/2017 1512      Component Value Date/Time  CALCIUM 8.8 (L) 03/28/2020 0933   CALCIUM 10.0 02/13/2017 1512   ALKPHOS 68 03/28/2020 0933   AST 19 03/28/2020 0933   AST 19 12/21/2019 0850   ALT 13 03/28/2020 0933   ALT 13 12/21/2019 0850   BILITOT 0.3 03/28/2020 0933   BILITOT 0.4 12/21/2019 0850       RADIOGRAPHIC STUDIES: I have reviewed CT imaging with the patient I have personally reviewed the radiological images as listed and agreed with the findings in the report. CT ABDOMEN PELVIS W CONTRAST  Result Date: 03/27/2020 CLINICAL DATA:  Endometrial cancer, s/p hysterectomy, chemotherapy complete, immunotherapy EXAM: CT ABDOMEN AND PELVIS WITH CONTRAST TECHNIQUE: Multidetector CT imaging of the abdomen and pelvis was performed using the standard protocol following bolus administration of intravenous contrast. CONTRAST:  37m OMNIPAQUE IOHEXOL 300 MG/ML SOLN, additional oral enteric contrast COMPARISON:  09/27/2019 FINDINGS: Lower chest: No acute abnormality. Mild, bandlike scarring of bilateral lung bases. Multiple small pulmonary nodules in the included lung bases are unchanged for example a 3 mm nodule of the medial right lower lobe (series 4, image 28). Hepatobiliary: Unchanged subcentimeter low-attenuation lesion of the central right lobe of the liver measuring 8 mm (series 2, image 14). No gallstones, gallbladder wall thickening, or biliary dilatation. Pancreas: Unremarkable. No pancreatic ductal dilatation or surrounding inflammatory changes. Spleen: Normal in size without significant abnormality. Adrenals/Urinary Tract: Adrenal glands are unremarkable. Kidneys are  normal, without renal calculi, solid lesion, or hydronephrosis. Bladder is unremarkable. Stomach/Bowel: Stomach is within normal limits. Appendix appears normal. No evidence of bowel wall thickening, distention, or inflammatory changes. Sigmoid diverticulosis. Vascular/Lymphatic: Aortic atherosclerosis. No enlarged abdominal or pelvic lymph nodes. Reproductive: Status post hysterectomy. Other: No abdominal wall hernia or abnormality. No abdominopelvic ascites. Musculoskeletal: No acute or significant osseous findings. IMPRESSION: 1. Status post hysterectomy. No evidence of recurrent or metastatic disease in the abdomen or pelvis. 2. Multiple small pulmonary nodules in the lung bases are unchanged to the extent that they are included on current examination and remain most likely sequelae of prior infection or inflammation. Attention on follow-up. 3. Aortic Atherosclerosis (ICD10-I70.0). Electronically Signed   By: AEddie CandleM.D.   On: 03/27/2020 09:49

## 2020-03-28 NOTE — Assessment & Plan Note (Signed)
I have reviewed CT imaging with the patient She has excellent response to therapy The plan will be to continue [name] indefinitely I will order echocardiogram before her treatment in October I recommend CT imaging every 6 months, next due around March 2022

## 2020-03-28 NOTE — Assessment & Plan Note (Signed)
She has history of hypomagnesemia We have stopped giving her IV magnesium for a while She is mildly hypokalemic today I plan to resume checking her magnesium level in her next visit and replace as needed

## 2020-03-28 NOTE — Assessment & Plan Note (Signed)
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 

## 2020-03-28 NOTE — Patient Instructions (Signed)

## 2020-03-28 NOTE — Patient Instructions (Signed)
[facility] °Discharge Instructions for Patients Receiving Chemotherapy ° °Today you received the following chemotherapy agents Trastuzumab ° °To help prevent nausea and vomiting after your treatment, we encourage you to take your nausea medication as directed. °  °If you develop nausea and vomiting that is not controlled by your nausea medication, call the clinic.  ° °BELOW ARE SYMPTOMS THAT SHOULD BE REPORTED IMMEDIATELY: °· *FEVER GREATER THAN 100.5 F °· *CHILLS WITH OR WITHOUT FEVER °· NAUSEA AND VOMITING THAT IS NOT CONTROLLED WITH YOUR NAUSEA MEDICATION °· *UNUSUAL SHORTNESS OF BREATH °· *UNUSUAL BRUISING OR BLEEDING °· TENDERNESS IN MOUTH AND THROAT WITH OR WITHOUT PRESENCE OF ULCERS °· *URINARY PROBLEMS °· *BOWEL PROBLEMS °· UNUSUAL RASH °Items with * indicate a potential emergency and should be followed up as soon as possible. ° °Feel free to call the clinic should you have any questions or concerns. The clinic phone number is [phone]. ° °Please show the CHEMO ALERT CARD at check-in to the Emergency Department and triage nurse. ° ° °

## 2020-03-28 NOTE — Telephone Encounter (Signed)
Called and left a message on voicemail with echo appt details for 10/4 at 9 am, arrive at 8:45 for echo.

## 2020-04-10 DIAGNOSIS — H33311 Horseshoe tear of retina without detachment, right eye: Secondary | ICD-10-CM | POA: Diagnosis not present

## 2020-04-13 DIAGNOSIS — C541 Malignant neoplasm of endometrium: Secondary | ICD-10-CM | POA: Diagnosis not present

## 2020-04-17 ENCOUNTER — Ambulatory Visit (HOSPITAL_COMMUNITY)
Admission: RE | Admit: 2020-04-17 | Discharge: 2020-04-17 | Disposition: A | Discharge status: Home / Self Care | Payer: Medicare HMO | Source: Ambulatory Visit | Attending: Hematology and Oncology | Admitting: Hematology and Oncology

## 2020-04-17 ENCOUNTER — Other Ambulatory Visit: Payer: Self-pay

## 2020-04-17 DIAGNOSIS — Z5111 Encounter for antineoplastic chemotherapy: Secondary | ICD-10-CM | POA: Insufficient documentation

## 2020-04-17 DIAGNOSIS — C50919 Malignant neoplasm of unspecified site of unspecified female breast: Secondary | ICD-10-CM | POA: Diagnosis not present

## 2020-04-17 DIAGNOSIS — I10 Essential (primary) hypertension: Secondary | ICD-10-CM | POA: Diagnosis not present

## 2020-04-17 DIAGNOSIS — I351 Nonrheumatic aortic (valve) insufficiency: Secondary | ICD-10-CM | POA: Diagnosis not present

## 2020-04-17 DIAGNOSIS — E785 Hyperlipidemia, unspecified: Secondary | ICD-10-CM | POA: Insufficient documentation

## 2020-04-17 DIAGNOSIS — Z0189 Encounter for other specified special examinations: Secondary | ICD-10-CM | POA: Diagnosis not present

## 2020-04-17 LAB — ECHOCARDIOGRAM COMPLETE
Area-P 1/2: 2.95 cm2
Calc EF: 61.2 %
S' Lateral: 2.1 cm
Single Plane A2C EF: 65.1 %
Single Plane A4C EF: 61.1 %

## 2020-04-17 NOTE — Progress Notes (Signed)
  Echocardiogram 2D Echocardiogram has been performed.  Vicki Cervantes 04/17/2020, 9:58 AM

## 2020-04-18 ENCOUNTER — Inpatient Hospital Stay: Payer: Medicare HMO | Attending: Gynecologic Oncology

## 2020-04-18 ENCOUNTER — Inpatient Hospital Stay: Payer: Medicare HMO

## 2020-04-18 ENCOUNTER — Other Ambulatory Visit: Payer: Self-pay

## 2020-04-18 VITALS — BP 131/66 | HR 69 | Temp 97.8°F | Resp 16 | Wt 133.5 lb

## 2020-04-18 DIAGNOSIS — C779 Secondary and unspecified malignant neoplasm of lymph node, unspecified: Secondary | ICD-10-CM | POA: Insufficient documentation

## 2020-04-18 DIAGNOSIS — N183 Chronic kidney disease, stage 3 unspecified: Secondary | ICD-10-CM | POA: Diagnosis not present

## 2020-04-18 DIAGNOSIS — Z90722 Acquired absence of ovaries, bilateral: Secondary | ICD-10-CM | POA: Insufficient documentation

## 2020-04-18 DIAGNOSIS — C541 Malignant neoplasm of endometrium: Secondary | ICD-10-CM

## 2020-04-18 DIAGNOSIS — Z170 Estrogen receptor positive status [ER+]: Secondary | ICD-10-CM | POA: Insufficient documentation

## 2020-04-18 DIAGNOSIS — Z79899 Other long term (current) drug therapy: Secondary | ICD-10-CM | POA: Insufficient documentation

## 2020-04-18 DIAGNOSIS — Z9079 Acquired absence of other genital organ(s): Secondary | ICD-10-CM | POA: Diagnosis not present

## 2020-04-18 DIAGNOSIS — Z5112 Encounter for antineoplastic immunotherapy: Secondary | ICD-10-CM | POA: Insufficient documentation

## 2020-04-18 DIAGNOSIS — Z90710 Acquired absence of both cervix and uterus: Secondary | ICD-10-CM | POA: Insufficient documentation

## 2020-04-18 DIAGNOSIS — E876 Hypokalemia: Secondary | ICD-10-CM | POA: Diagnosis not present

## 2020-04-18 DIAGNOSIS — D631 Anemia in chronic kidney disease: Secondary | ICD-10-CM | POA: Insufficient documentation

## 2020-04-18 DIAGNOSIS — Z7982 Long term (current) use of aspirin: Secondary | ICD-10-CM | POA: Insufficient documentation

## 2020-04-18 DIAGNOSIS — E042 Nontoxic multinodular goiter: Secondary | ICD-10-CM | POA: Diagnosis not present

## 2020-04-18 LAB — COMPREHENSIVE METABOLIC PANEL
ALT: 14 U/L (ref 0–44)
AST: 18 U/L (ref 15–41)
Albumin: 3.8 g/dL (ref 3.5–5.0)
Alkaline Phosphatase: 68 U/L (ref 38–126)
Anion gap: 6 (ref 5–15)
BUN: 23 mg/dL (ref 8–23)
CO2: 28 mmol/L (ref 22–32)
Calcium: 9.6 mg/dL (ref 8.9–10.3)
Chloride: 102 mmol/L (ref 98–111)
Creatinine, Ser: 1.32 mg/dL — ABNORMAL HIGH (ref 0.44–1.00)
GFR calc non Af Amer: 38 mL/min — ABNORMAL LOW (ref >60)
Glucose, Bld: 105 mg/dL — ABNORMAL HIGH (ref 70–99)
Potassium: 3.5 mmol/L (ref 3.5–5.1)
Sodium: 136 mmol/L (ref 135–145)
Total Bilirubin: 0.4 mg/dL (ref 0.3–1.2)
Total Protein: 6.8 g/dL (ref 6.5–8.1)

## 2020-04-18 LAB — CBC WITH DIFFERENTIAL/PLATELET
Abs Immature Granulocytes: 0.03 10*3/uL (ref 0.00–0.07)
Basophils Absolute: 0 10*3/uL (ref 0.0–0.1)
Basophils Relative: 1 %
Eosinophils Absolute: 0.2 10*3/uL (ref 0.0–0.5)
Eosinophils Relative: 4 %
HCT: 31.1 % — ABNORMAL LOW (ref 36.0–46.0)
Hemoglobin: 10.8 g/dL — ABNORMAL LOW (ref 12.0–15.0)
Immature Granulocytes: 1 %
Lymphocytes Relative: 20 %
Lymphs Abs: 1.2 10*3/uL (ref 0.7–4.0)
MCH: 31.3 pg (ref 26.0–34.0)
MCHC: 34.7 g/dL (ref 30.0–36.0)
MCV: 90.1 fL (ref 80.0–100.0)
Monocytes Absolute: 0.6 10*3/uL (ref 0.1–1.0)
Monocytes Relative: 10 %
Neutro Abs: 3.9 10*3/uL (ref 1.7–7.7)
Neutrophils Relative %: 64 %
Platelets: 180 10*3/uL (ref 150–400)
RBC: 3.45 MIL/uL — ABNORMAL LOW (ref 3.87–5.11)
RDW: 12.4 % (ref 11.5–15.5)
WBC: 5.9 10*3/uL (ref 4.0–10.5)
nRBC: 0 % (ref 0.0–0.2)

## 2020-04-18 LAB — MAGNESIUM: Magnesium: 0.8 mg/dL — CL (ref 1.7–2.4)

## 2020-04-18 MED ORDER — DIPHENHYDRAMINE HCL 25 MG PO CAPS
ORAL_CAPSULE | ORAL | Status: AC
  Filled 2020-04-18: qty 1

## 2020-04-18 MED ORDER — SODIUM CHLORIDE 0.9% FLUSH
10.0000 mL | INTRAVENOUS | Status: DC | PRN
  Administered 2020-04-18: 10 mL
  Filled 2020-04-18: qty 10

## 2020-04-18 MED ORDER — HEPARIN SOD (PORK) LOCK FLUSH 100 UNIT/ML IV SOLN
500.0000 [IU] | Freq: Once | INTRAVENOUS | Status: AC | PRN
  Administered 2020-04-18: 500 [IU]
  Filled 2020-04-18: qty 5

## 2020-04-18 MED ORDER — SODIUM CHLORIDE 0.9% FLUSH
10.0000 mL | Freq: Once | INTRAVENOUS | Status: AC
  Administered 2020-04-18: 10 mL
  Filled 2020-04-18: qty 10

## 2020-04-18 MED ORDER — ACETAMINOPHEN 325 MG PO TABS
650.0000 mg | ORAL_TABLET | Freq: Once | ORAL | Status: AC
  Administered 2020-04-18: 650 mg via ORAL

## 2020-04-18 MED ORDER — TRASTUZUMAB-DKST CHEMO 150 MG IV SOLR
6.0000 mg/kg | Freq: Once | INTRAVENOUS | Status: AC
  Administered 2020-04-18: 357 mg via INTRAVENOUS
  Filled 2020-04-18: qty 17

## 2020-04-18 MED ORDER — DIPHENHYDRAMINE HCL 25 MG PO TABS
25.0000 mg | ORAL_TABLET | Freq: Once | ORAL | Status: AC
  Administered 2020-04-18: 25 mg via ORAL
  Filled 2020-04-18: qty 1

## 2020-04-18 MED ORDER — ACETAMINOPHEN 325 MG PO TABS
ORAL_TABLET | ORAL | Status: AC
  Filled 2020-04-18: qty 2

## 2020-04-18 MED ORDER — SODIUM CHLORIDE 0.9 % IV SOLN
Freq: Once | INTRAVENOUS | Status: AC
  Filled 2020-04-18: qty 250

## 2020-04-18 NOTE — Patient Instructions (Signed)

## 2020-04-18 NOTE — Progress Notes (Signed)
Laurence Compton, RN called critical Mag 0.8. Dr Alvy Bimler made aware and states, "no need to treat".

## 2020-04-18 NOTE — Patient Instructions (Signed)
[facility] Discharge Instructions for Patients Receiving Chemotherapy  Today you received the following chemotherapy agents trastuzumab.  To help prevent nausea and vomiting after your treatment, we encourage you to take your nausea medication as directed.    If you develop nausea and vomiting that is not controlled by your nausea medication, call the clinic.   BELOW ARE SYMPTOMS THAT SHOULD BE REPORTED IMMEDIATELY:  *FEVER GREATER THAN 100.5 F  *CHILLS WITH OR WITHOUT FEVER  NAUSEA AND VOMITING THAT IS NOT CONTROLLED WITH YOUR NAUSEA MEDICATION  *UNUSUAL SHORTNESS OF BREATH  *UNUSUAL BRUISING OR BLEEDING  TENDERNESS IN MOUTH AND THROAT WITH OR WITHOUT PRESENCE OF ULCERS  *URINARY PROBLEMS  *BOWEL PROBLEMS  UNUSUAL RASH Items with * indicate a potential emergency and should be followed up as soon as possible.  Feel free to call the clinic should you have any questions or concerns. The clinic phone number is [phone].  Please show the CHEMO ALERT CARD at check-in to the Emergency Department and triage nurse.   

## 2020-05-09 ENCOUNTER — Encounter: Payer: Self-pay | Admitting: Hematology and Oncology

## 2020-05-09 ENCOUNTER — Inpatient Hospital Stay: Payer: Medicare HMO

## 2020-05-09 ENCOUNTER — Inpatient Hospital Stay: Payer: Medicare HMO | Admitting: Hematology and Oncology

## 2020-05-09 ENCOUNTER — Other Ambulatory Visit: Payer: Self-pay | Admitting: Hematology and Oncology

## 2020-05-09 ENCOUNTER — Other Ambulatory Visit: Payer: Self-pay

## 2020-05-09 DIAGNOSIS — C773 Secondary and unspecified malignant neoplasm of axilla and upper limb lymph nodes: Secondary | ICD-10-CM | POA: Diagnosis not present

## 2020-05-09 DIAGNOSIS — Z5112 Encounter for antineoplastic immunotherapy: Secondary | ICD-10-CM | POA: Diagnosis not present

## 2020-05-09 DIAGNOSIS — N183 Chronic kidney disease, stage 3 unspecified: Secondary | ICD-10-CM | POA: Diagnosis not present

## 2020-05-09 DIAGNOSIS — C541 Malignant neoplasm of endometrium: Secondary | ICD-10-CM

## 2020-05-09 DIAGNOSIS — D630 Anemia in neoplastic disease: Secondary | ICD-10-CM | POA: Diagnosis not present

## 2020-05-09 DIAGNOSIS — E876 Hypokalemia: Secondary | ICD-10-CM

## 2020-05-09 DIAGNOSIS — Z7189 Other specified counseling: Secondary | ICD-10-CM | POA: Diagnosis not present

## 2020-05-09 LAB — CBC WITH DIFFERENTIAL/PLATELET
Abs Immature Granulocytes: 0.03 10*3/uL (ref 0.00–0.07)
Basophils Absolute: 0.1 10*3/uL (ref 0.0–0.1)
Basophils Relative: 1 %
Eosinophils Absolute: 0.2 10*3/uL (ref 0.0–0.5)
Eosinophils Relative: 4 %
HCT: 30.2 % — ABNORMAL LOW (ref 36.0–46.0)
Hemoglobin: 10.7 g/dL — ABNORMAL LOW (ref 12.0–15.0)
Immature Granulocytes: 1 %
Lymphocytes Relative: 22 %
Lymphs Abs: 1.3 10*3/uL (ref 0.7–4.0)
MCH: 31.8 pg (ref 26.0–34.0)
MCHC: 35.4 g/dL (ref 30.0–36.0)
MCV: 89.9 fL (ref 80.0–100.0)
Monocytes Absolute: 0.5 10*3/uL (ref 0.1–1.0)
Monocytes Relative: 8 %
Neutro Abs: 3.7 10*3/uL (ref 1.7–7.7)
Neutrophils Relative %: 64 %
Platelets: 200 10*3/uL (ref 150–400)
RBC: 3.36 MIL/uL — ABNORMAL LOW (ref 3.87–5.11)
RDW: 12.2 % (ref 11.5–15.5)
WBC: 5.7 10*3/uL (ref 4.0–10.5)
nRBC: 0 % (ref 0.0–0.2)

## 2020-05-09 LAB — COMPREHENSIVE METABOLIC PANEL
ALT: 15 U/L (ref 0–44)
AST: 19 U/L (ref 15–41)
Albumin: 3.9 g/dL (ref 3.5–5.0)
Alkaline Phosphatase: 67 U/L (ref 38–126)
Anion gap: 9 (ref 5–15)
BUN: 28 mg/dL — ABNORMAL HIGH (ref 8–23)
CO2: 25 mmol/L (ref 22–32)
Calcium: 9.6 mg/dL (ref 8.9–10.3)
Chloride: 102 mmol/L (ref 98–111)
Creatinine, Ser: 1.41 mg/dL — ABNORMAL HIGH (ref 0.44–1.00)
GFR, Estimated: 38 mL/min — ABNORMAL LOW (ref >60)
Glucose, Bld: 119 mg/dL — ABNORMAL HIGH (ref 70–99)
Potassium: 3.2 mmol/L — ABNORMAL LOW (ref 3.5–5.1)
Sodium: 136 mmol/L (ref 135–145)
Total Bilirubin: 0.5 mg/dL (ref 0.3–1.2)
Total Protein: 6.7 g/dL (ref 6.5–8.1)

## 2020-05-09 LAB — MAGNESIUM: Magnesium: 0.8 mg/dL — CL (ref 1.7–2.4)

## 2020-05-09 MED ORDER — TRASTUZUMAB-DKST CHEMO 150 MG IV SOLR
6.0000 mg/kg | Freq: Once | INTRAVENOUS | Status: AC
  Administered 2020-05-09: 357 mg via INTRAVENOUS
  Filled 2020-05-09: qty 17

## 2020-05-09 MED ORDER — MAGNESIUM SULFATE 2 GM/50ML IV SOLN
INTRAVENOUS | Status: AC
  Filled 2020-05-09: qty 50

## 2020-05-09 MED ORDER — LIDOCAINE-PRILOCAINE 2.5-2.5 % EX CREA: TOPICAL_CREAM | CUTANEOUS | 3 refills | Status: DC

## 2020-05-09 MED ORDER — ACETAMINOPHEN 325 MG PO TABS
ORAL_TABLET | ORAL | Status: AC
  Filled 2020-05-09: qty 2

## 2020-05-09 MED ORDER — DIPHENHYDRAMINE HCL 25 MG PO TABS
25.0000 mg | ORAL_TABLET | Freq: Once | ORAL | Status: AC
  Administered 2020-05-09: 25 mg via ORAL
  Filled 2020-05-09: qty 1

## 2020-05-09 MED ORDER — MAGNESIUM SULFATE 2 GM/50ML IV SOLN
2.0000 g | Freq: Once | INTRAVENOUS | Status: AC
  Administered 2020-05-09: 2 g via INTRAVENOUS

## 2020-05-09 MED ORDER — SODIUM CHLORIDE 0.9 % IV SOLN
Freq: Once | INTRAVENOUS | Status: AC
  Filled 2020-05-09: qty 250

## 2020-05-09 MED ORDER — ACETAMINOPHEN 325 MG PO TABS
650.0000 mg | ORAL_TABLET | Freq: Once | ORAL | Status: AC
  Administered 2020-05-09: 650 mg via ORAL

## 2020-05-09 MED ORDER — HEPARIN SOD (PORK) LOCK FLUSH 100 UNIT/ML IV SOLN
500.0000 [IU] | Freq: Once | INTRAVENOUS | Status: DC | PRN
  Filled 2020-05-09: qty 5

## 2020-05-09 MED ORDER — SODIUM CHLORIDE 0.9% FLUSH
10.0000 mL | Freq: Once | INTRAVENOUS | Status: AC
  Administered 2020-05-09: 10 mL
  Filled 2020-05-09: qty 10

## 2020-05-09 MED ORDER — SODIUM CHLORIDE 0.9% FLUSH
10.0000 mL | INTRAVENOUS | Status: DC | PRN
  Filled 2020-05-09: qty 10

## 2020-05-09 MED ORDER — DIPHENHYDRAMINE HCL 25 MG PO CAPS
ORAL_CAPSULE | ORAL | Status: AC
  Filled 2020-05-09: qty 1

## 2020-05-09 NOTE — Assessment & Plan Note (Signed)
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 

## 2020-05-09 NOTE — Progress Notes (Signed)
Reported critical magnesium 0.8 to Dr. Alvy Bimler.

## 2020-05-09 NOTE — Assessment & Plan Note (Addendum)
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  Per recommendation, she will be closely followed at [facility]

## 2020-05-09 NOTE — Assessment & Plan Note (Signed)
Her hypokalemia is related to low magnesium I do not plan to replace her potassium, rather, we will give her IV magnesium today and her potassium level should improve

## 2020-05-09 NOTE — Progress Notes (Signed)
[facility] OFFICE PROGRESS NOTE  Patient Care Team: Burnard Bunting, [name]  ASSESSMENT & PLAN:  Endometrial cancer [facility]) Her last CT imaging showed excellent response to therapy The plan will be to continue [name] indefinitely I recommend CT imaging every 6 months, next due around March 2022 She will be due for echocardiogram early next year She saw GYN surgeon recently.  Per recommendation, she will be closely followed at [facility]  CKD (chronic kidney disease), stage III ([place]) Renal function is stable. Monitor closely She was seen by nephrologist recently  Anemia in neoplastic disease She has multifactorial anemia, anemia secondary to prior chemotherapy as well as chronic kidney disease Anemia is improving/stable Recent vitamin B12 level was adequate She is not symptomatic Observe only  Hypomagnesemia She has history of hypomagnesemia We have stopped giving her IV magnesium for a while She is mildly hypokalemic today I recommend we proceed with magnesium infusion today  Hypokalemia due to excessive renal loss of potassium Her hypokalemia is related to low magnesium I do not plan to replace her potassium, rather, we will give her IV magnesium today and her potassium level should improve   No orders of the defined types were placed in this encounter.   All questions were answered. The patient knows to call the clinic with any problems, questions or concerns. The total time spent in the appointment was 30 minutes encounter with patients including review of chart and various tests results, discussions about plan of care and coordination of care plan   [name], MD 05/09/2020 3:38 PM  INTERVAL HISTORY: Please see below for problem oriented charting. She returns for treatment and follow-up She tolerated single agent [name] well Denies recent cough, chest pain or shortness of breath No leg swelling No recent infection,  fever or chills No new lymphadenopathy  SUMMARY OF ONCOLOGIC HISTORY: Oncology History Overview Note  XAVIA KNISKERN  has a remote history of left  breast cancer at age 27 but received BRCA testing approximately 5 years ago which was negative. Her cancer was treated with surgery but no radiation or chemotherapy.   Serous endometrial cancer, MSI stable ER positive, PR neg, Her2/neu 3+      Endometrial cancer ([place])  08/29/2016 Pathology Results   Endometrium, biopsy - HIGH GRADE ENDOMETRIAL CARCINOMA, SEE COMMENT. Microscopic Comment The sections show multiple fragments of adenocarcinoma displaying glandular and papillary patterns associated with high grade cytomorphology characterized by nuclear pleomorphism, prominent nucleoli and brisk mitosis. Immunohistochemical stains show that the tumor cells are positive for vimentin, p16, p53 with increased Ki-67 expression. Estrogen and progesterone receptor stains show patchy weak positivity. No significant positivity is seen with CEA. The findings are consistent with high grade endometrial carcinoma and the overall morphology and phenotypic features favor serous carcinoma.   08/29/2016 Initial Diagnosis   She presented with postmenopausal bleeding   10/03/2016 Imaging   CT C/A/P 09/2016 IMPRESSION: 1. Thickening of the endometrial canal up to 19 mm in fundus, presumably corresponding to the patient's reported endometrial carcinoma. 2. Multiple tiny pulmonary nodules scattered throughout the lungs bilaterally measuring 4 mm or less in size. Nodules of this size are typically considered statistically likely benign. In the setting of known primary malignancy, metastatic disease to the lungs is not excluded, but is not strongly favored on today's examination. Attention on followup studies is recommended to ensure the stability or resolution of these nodules. 3. Subcentimeter low-attenuation lesion in the central aspect of segment  8 of the liver is too  small to characterize. This is statistically likely a tiny cyst, but warrants attention on follow-up studies to exclude the possibility of a solitary hepatic metastasis. 4. 1.5 x 1.5 x 1.7 cm well-circumscribed lesion in the proximal stomach. This is of uncertain etiology and significance, and could represent a benign gastric polyp, however, further evaluation with nonemergent endoscopy is suggested in the near future for further evaluation. 5. **An incidental finding of potential clinical significance has been found. 1.1 x 1.6 cm thyroid nodule in the inferior aspect of the right lobe of the thyroid gland. Follow-up evaluation with nonemergent thyroid ultrasound is recommended in the near future to better evaluate this finding. This recommendation follows ACR consensuss guidelines: Managing Incidental Thyroid Nodules Detected on Imaging: White Paper of the ACR Incidental Thyroid Findings Committee. J Am Coll Radiol 2015;12(2):143-150.** 6. Aortic atherosclerosis, in addition to left anterior descending coronary artery disease   10/22/2016 Surgery   Robotic assisted total hysterectomy, BSO and bilateral pelvic lymphadenectomy  Final pathology revealed a 3cm polyp containing serous carcinoma but with no myometrial invasion, no LVSI and negative nodes.  Stage IA Uterine serous cancer   10/22/2016 Pathology Results   1. Lymph nodes, regional resection, right pelvic - SIX BENIGN LYMPH NODES (0/6). 2. Lymph nodes, regional resection, left pelvic - SEVEN BENIGN LYMPH NODES (0/7). 3. Uterus +/- tubes/ovaries, neoplastic, with right ovary and fallopian tube ENDOMYOMETRIUM - SEROUS CARCINOMA ARISING WITHIN AN ENDOMETRIAL POLYP - NO MYOMETRIAL INVASION IDENTIFIED - ADENOMYOSIS - LEIOMYOMA (1 CM) - SEE ONCOLOGY TABLE AND COMMENT CERVIX - CARCINOMA FOCALLY INVOLVES ENDOCERVICAL GLANDS - NABOTHIAN CYSTS RIGHT ADNEXA - BENIGN OVARY AND FALLOPIAN TUBE - NO CARCINOMA IDENTIFIED 4. Cul-de-sac biopsy -  MESOTHELIAL HYPERPLASIA Microscopic Comment 3. ONCOLOGY TABLE-UTERUS, CARCINOMA OR CARCINOSARCOMA Specimen: Uterus, right fallopian tube and ovary Procedure: Total hysterectomy and right salpingo-oophorectomy Lymph node sampling performed: Bilateral pelvic regional resection Specimen integrity: Intact Maximum tumor size: 3 cm (polyp) Histologic type: Serous carcinoma Grade: High grade Myometrial invasion: Not identified Cervical stromal involvement: No, focal endocervical gland involvement Extent of involvement of other organs: Not identified Lymph - vascular invasion: Not identified Peritoneal washings: N/A Lymph nodes: Examined: 0 Sentinel 13 Non-sentinel 13 Total Lymph nodes with metastasis: 0 Isolated tumor cells (< 0.2 mm): 0 Micrometastasis: (> 0.2 mm and < 2.0 mm): 0 Macrometastasis: (> 2.0 mm): 0 Extracapsular extension: N/A Pelvic lymph nodes: 0 involved of 13 lymph nodes. Para-aortic lymph nodes: No para-aortic nodes submitted TNM code: pT1a, pNX FIGO Stage (based on pathologic findings, needs clinical correlation): IA Comment: Immunohistochemistry for cytokeratin AE1/AE3 is performed on all of the lymph nodes (parts 1 & 2) and no metastatic carcinoma is identified.   07/04/2017 Genetic Testing   The patient had genetic testing due to a personal history of breast and uterine cancer, and a family history of stomach cancer.  The Multi-Cancer Panel was ordered. The Multi-Cancer Panel offered by [facility] includes sequencing and/or deletion duplication testing of the following 83 genes: ALK, APC, ATM, AXIN2,BAP1,  BARD1, BLM, BMPR1A, BRCA1, BRCA2, BRIP1, CASR, CDC73, CDH1, CDK4, CDKN1B, CDKN1C, CDKN2A (p14ARF), CDKN2A (p16INK4a), CEBPA, CHEK2, CTNNA1, DICER1, DIS3L2, EGFR (c.2369C>T, p.Thr790Met variant only), EPCAM (Deletion/duplication testing only), FH, FLCN, GATA2, GPC3, GREM1 (Promoter region deletion/duplication testing only), HOXB13 (c.251G>A, p.Gly84Glu), HRAS, KIT, MAX,  MEN1, MET, MITF (c.952G>A, p.Glu318Lys variant only), MLH1, MSH2, MSH3, MSH6, MUTYH, NBN, NF1, NF2, NTHL1, PALB2, PDGFRA, PHOX2B, PMS2, POLD1, POLE, POT1, PRKAR1A, PTCH1, PTEN, RAD50, RAD51C, RAD51D, RB1, RECQL4, RET,  RUNX1, SDHAF2, SDHA (sequence changes only), SDHB, SDHC, SDHD, SMAD4, SMARCA4, SMARCB1, SMARCE1, STK11, SUFU, TERC, TERT, TMEM127, TP53, TSC1, TSC2, VHL, WRN and WT1.   Results: No pathogenic variants were identified.  A variant of uncertain significance in the gene APC was identified.  c.791A>G (p.Gln264Arg).  The date of this test report is 07/04/2017.     Genetic Testing   Patient has genetic testing done for MSI. 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    12/03/2017 Imaging   CT Chest/Abd/Pelvis to follow pulmonary nodule and gastric mass IMPRESSION: 1. Stable CT of the chest. Small pulmonary nodules are unchanged when compared with previous exam. 2. No new findings identified. 3. Subcentimeter low-attenuation lesions within the liver are remain too small to characterize but are stable from prior exam. 4. Persistent indeterminate low-attenuation structure within the proximal stomach is unchanged measuring 1.4 cm. Correlation with direct visualization is advised   06/30/2018 Imaging   CT CHEST Lungs/Pleura: Stable scattered sub-cm pulmonary nodules are again seen bilaterally and are stable compared to previous studies. No new or enlarging pulmonary nodules or masses identified. No evidence of pulmonary infiltrate or pleural effusion.   08/19/2018 Echocardiogram   [place] is done in [place]: EF 60-65%. Mild impaired relaxation. (report scanned)   09/17/2018 Relapse/Recurrence   Presented with c/o vagina discharge.  Lesion noted at the left vaginal apex 47m lesion removed Path c/w high grade serous cancer   09/17/2018 Pathology Results   Vagina, biopsy, left apex - HIGH GRADE SEROUS CARCINOMA.   09/28/2018 PET scan   1. Two hypermetabolic axial  lymph nodes. Unusual site for metastatic endometrial carcinoma however the activity is more intense than typically seen in reactive adenopathy. Suggest ultrasound-guided percutaneous biopsy of the larger RIGHT axial lymph node 2. No evidence of local recurrence at the vaginal cuff.  3. No metastatic adenopathy in the abdomen or pelvis. 4. Stable small pulmonary nodules   10/09/2018 Pathology Results   Lymph node, needle/core biopsy, right axilla - METASTATIC CARCINOMA, SEE COMMENT. Microscopic Comment The carcinoma appears high grade. Immunohistochemistry is positive for cytokeratin 7, PAX-8, and ER. Cytokeratin 20, CDX-2, PR, and GATA-3 are negative. The findings along with the history are consistent with a gynecologic primary.    10/09/2018 Procedure   Ultrasound-guided core biopsies of a right axillary lymph node.   10/14/2018 Cancer Staging   Staging form: Corpus Uteri - Carcinoma and Carcinosarcoma, AJCC 8th Edition - Clinical: Stage IVB (cT1, cN0, pM1) - Signed by GHeath Lark MD on 10/14/2018   10/19/2018 Imaging   Placement of single lumen port a cath via right internal jugular vein. The catheter tip lies at the cavo-atrial junction. A power injectable port a cath was placed and is ready for immediate use.    10/21/2018 -  Chemotherapy   The patient had carboplatin and taxol for treatment   11/11/2018 -  Chemotherapy   The patient had trastuzumab ([name]) 450 mg in sodium chloride 0.9 % 250 mL chemo infusion, 483 mg, Intravenous,  Once, 6 of 6 cycles Administration: 450 mg (11/11/2018), 378 mg (12/02/2018), 378 mg (02/03/2019), 378 mg (12/23/2018), 378 mg (01/13/2019), 378 mg (02/24/2019) trastuzumab-dkst (OGIVRI) 378 mg in sodium chloride 0.9 % 250 mL chemo infusion, 6 mg/kg = 378 mg (100 % of original dose 6 mg/kg), Intravenous,  Once, 20 of 23 cycles Dose modification: 6 mg/kg (original dose 6 mg/kg, Cycle 7, Reason: Other (see comments)) Administration: 378 mg (03/24/2019), 378 mg  (04/14/2019), 378 mg (  05/05/2019), 378 mg (05/26/2019), 378 mg (06/16/2019), 378 mg (07/06/2019), 357 mg (07/28/2019), 357 mg (08/18/2019), 357 mg (09/07/2019), 357 mg (09/28/2019), 357 mg (10/19/2019), 357 mg (11/09/2019), 357 mg (11/30/2019), 357 mg (12/21/2019), 357 mg (01/20/2020), 357 mg (02/15/2020), 357 mg (03/07/2020), 357 mg (03/28/2020), 357 mg (04/18/2020), 357 mg (05/09/2020)  for chemotherapy treatment.    12/21/2018 PET scan   1. Interval resolution of hypermetabolic right axillary lymph nodes. No metabolic findings highly suspicious for recurrent metastatic disease. 2. New mild hypermetabolism within a borderline prominent portacaval lymph node, nonspecific. While a reactive node is favored, a lymph node metastasis cannot be entirely excluded. Suggest attention to this lymph node on follow-up PET-CT in 3-6 months. 3. Nonspecific new hypermetabolism at the ileocecal valve, more likely physiologic given absence of CT correlate. 4. Scattered subcentimeter pulmonary nodules are all stable and below PET resolution, more likely benign, continued CT surveillance advised. 5. Chronic findings include: Aortic Atherosclerosis (ICD10-I70.0). Marked diffuse colonic diverticulosis. Coronary atherosclerosis.    12/28/2018 Echocardiogram   1. The left ventricle has normal systolic function with an ejection fraction of 60-65%. The cavity size was normal. Left ventricular diastolic Doppler parameters are consistent with impaired relaxation.  2. GLS recorded as -10.7 but LV appears hyperdynamic and tracking of endocardium appears poor.  3. The right ventricle has normal systolic function. The cavity was normal. There is no increase in right ventricular wall thickness.  4. Mild thickening of the mitral valve leaflet.  5. The aortic valve was not well visualized. Mild thickening of the aortic valve. Aortic valve regurgitation is trivial by color flow Doppler.   03/23/2019 Imaging   PET 1. No findings of hypermetabolic  residual/recurrent or metastatic disease. 2. Similar low-level hypermetabolism within normal sized portocaval and right inguinal nodes, favored to be reactive. 3. Ongoing stability of small bilateral pulmonary nodules, favored to be benign. Below PET resolution. 4. Coronary artery atherosclerosis. Aortic Atherosclerosis   03/26/2019 Echocardiogram   1. The left ventricle has normal systolic function, with an ejection fraction of 55-60%. The cavity size was normal. Left ventricular diastolic Doppler parameters are consistent with impaired relaxation.  2. The right ventricle has normal systolic function. The cavity was normal.  3. The mitral valve is abnormal. Mild thickening of the mitral valve leaflet. There is mild mitral annular calcification present.  4. The tricuspid valve is grossly normal.  5. The aortic valve is tricuspid. Mild calcification of the aortic valve. No stenosis of the aortic valve.  6. The aorta is normal unless otherwise noted.  7. Normal LV systolic function; grade 1 diastolic dysfunction; ZJI-96.7%.   06/30/2019 Imaging   Chest Impression:   1. No evidence thoracic metastasis. 2. Stable small benign-appearing pulmonary nodules   Abdomen / Pelvis Impression:   1. No evidence of metastatic disease in the abdomen pelvis. 2.  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 3.  Aortic Atherosclerosis (ICD10-I70.0).   06/30/2019 Echocardiogram    1. Left ventricular ejection fraction, by visual estimation, is 60 to 65%. The left ventricle has normal function. There is no left ventricular hypertrophy.  2. Left ventricular diastolic parameters are consistent with Grade I diastolic dysfunction (impaired relaxation).  3. The left ventricle has no regional wall motion abnormalities.  4. Global right ventricle has normal systolic function.The right ventricular size is normal. No increase in right ventricular wall thickness.  5. Left atrial size was mild-moderately  dilated.  6. Right atrial size was normal.  7. The mitral valve is normal in  structure. Trivial mitral valve regurgitation.  8. The tricuspid valve is normal in structure. Tricuspid valve regurgitation is trivial.  9. The aortic valve is normal in structure. Aortic valve regurgitation is trivial. No evidence of aortic valve sclerosis or stenosis. 10. The pulmonic valve was grossly normal. Pulmonic valve regurgitation is not visualized. 11. Mildly elevated pulmonary artery systolic pressure. 12. The inferior vena cava is normal in size with greater than 50% respiratory variability, suggesting right atrial pressure of 3 mmHg. 13. The average left ventricular global longitudinal strain is -12.5 %. GLS underestimated due to poor endocardial tracking.     09/27/2019 Imaging   1. Multiple small, nonspecific pulmonary nodules are again noted. The previously noted lung nodules are unchanged in size from previous exam. There is a new lung nodule within the medial right lower lobe which is nonspecific measure 4 mm. Attention at follow-up imaging is recommended. 2. No evidence of metastatic disease within the abdomen or pelvis. 3. Coronary artery calcifications.  The 4.  Aortic Atherosclerosis (ICD10-I70.0).   09/30/2019 Echocardiogram    1. Left ventricular ejection fraction, by estimation, is 60 to 65%. The left ventricle has normal function. The left ventricle has no regional wall motion abnormalities. Left ventricular diastolic parameters are consistent with Grade I diastolic dysfunction (impaired relaxation).  2. Right ventricular systolic function is normal. The right ventricular size is normal. There is normal pulmonary artery systolic pressure. The estimated right ventricular systolic pressure is 86.5 mmHg.  3. The mitral valve is normal in structure. No evidence of mitral valve regurgitation. No evidence of mitral stenosis.  4. The aortic valve is normal in structure. Aortic valve regurgitation is  trivial. No aortic stenosis is present.  5. The inferior vena cava is normal in size with greater than 50% respiratory variability, suggesting right atrial pressure of 3 mmHg.     01/07/2020 Echocardiogram    1. Normal LV function; grade 1 diastolic dysfunction; mild AI; GLS -21%.  2. Left ventricular ejection fraction, by estimation, is 55 to 60%. The left ventricle has normal function. The left ventricle has no regional wall motion abnormalities. Left ventricular diastolic parameters are consistent with Grade I diastolic dysfunction (impaired relaxation).  3. Right ventricular systolic function is normal. The right ventricular size is normal.  4. The mitral valve is normal in structure. Trivial mitral valve regurgitation. No evidence of mitral stenosis.  5. The aortic valve is tricuspid. Aortic valve regurgitation is mild. Mild aortic valve sclerosis is present, with no evidence of aortic valve stenosis.  6. The inferior vena cava is normal in size with greater than 50% respiratory variability, suggesting right atrial pressure of 3 mmHg.     03/27/2020 Imaging   1. Status post hysterectomy. No evidence of recurrent or metastatic disease in the abdomen or pelvis. 2. Multiple small pulmonary nodules in the lung bases are unchanged to the extent that they are included on current examination and remain most likely sequelae of prior infection or inflammation. Attention on follow-up. 3. Aortic Atherosclerosis (ICD10-I70.0).   04/17/2020 Echocardiogram   1. Normal LVEF, normal and unchanged GLS: -20.2%.  2. Left ventricular ejection fraction, by estimation, is 60 to 65%. The left ventricle has normal function. The left ventricle has no regional wall motion abnormalities. There is mild concentric left ventricular hypertrophy. Left ventricular diastolic parameters are consistent with Grade I diastolic dysfunction (impaired relaxation). The average left ventricular global longitudinal strain is -20.2 %. The  global longitudinal strain is normal.  3. Right ventricular  systolic function is normal. The right ventricular size is normal.  4. The mitral valve is normal in structure. Mild mitral valve regurgitation. No evidence of mitral stenosis.  5. The aortic valve is normal in structure. There is mild calcification of the aortic valve. There is mild thickening of the aortic valve. Aortic valve regurgitation is mild. No aortic stenosis is present.  6. The inferior vena cava is normal in size with greater than 50% respiratory variability, suggesting right atrial pressure of 3 mmHg.   Metastasis to lymph nodes ([place])  10/14/2018 Initial Diagnosis   Metastasis to lymph nodes ([place])   10/21/2018 - 02/24/2019 Chemotherapy   The patient had palonosetron (ALOXI) injection 0.25 mg, 0.25 mg, Intravenous,  Once, 7 of 7 cycles Administration: 0.25 mg (10/21/2018), 0.25 mg (11/11/2018), 0.25 mg (12/02/2018), 0.25 mg (12/23/2018), 0.25 mg (01/13/2019), 0.25 mg (02/03/2019), 0.25 mg (02/24/2019) CARBOplatin (PARAPLATIN) 310 mg in sodium chloride 0.9 % 250 mL chemo infusion, 310 mg, Intravenous,  Once, 7 of 7 cycles Dose modification: 300 mg (original dose 311.5 mg, Cycle 4, Reason: Dose not tolerated) Administration: 310 mg (10/21/2018), 300 mg (11/11/2018), 300 mg (12/02/2018), 300 mg (12/23/2018), 300 mg (01/13/2019), 300 mg (02/03/2019), 300 mg (02/24/2019) PACLitaxel (TAXOL) 222 mg in sodium chloride 0.9 % 250 mL chemo infusion (> 51m/m2), 135 mg/m2 = 222 mg, Intravenous,  Once, 7 of 7 cycles Administration: 222 mg (10/21/2018), 222 mg (11/11/2018), 222 mg (12/02/2018), 222 mg (12/23/2018), 222 mg (01/13/2019), 222 mg (02/03/2019), 222 mg (02/24/2019) fosaprepitant (EMEND) 150 mg, dexamethasone (DECADRON) 12 mg in sodium chloride 0.9 % 145 mL IVPB, , Intravenous,  Once, 7 of 7 cycles Administration:  (10/21/2018),  (11/11/2018),  (12/02/2018),  (12/23/2018),  (01/13/2019),  (02/03/2019),  (02/24/2019)  for chemotherapy treatment.      REVIEW OF  SYSTEMS:   Constitutional: Denies fevers, chills or abnormal weight loss Eyes: Denies blurriness of vision Ears, nose, mouth, throat, and face: Denies mucositis or sore throat Respiratory: Denies cough, dyspnea or wheezes Cardiovascular: Denies palpitation, chest discomfort or lower extremity swelling Gastrointestinal:  Denies nausea, heartburn or change in bowel habits Skin: Denies abnormal skin rashes Lymphatics: Denies new lymphadenopathy or easy bruising Neurological:Denies numbness, tingling or new weaknesses Behavioral/Psych: Mood is stable, no new changes  All other systems were reviewed with the patient and are negative.  I have reviewed the past medical history, past surgical history, social history and family history with the patient and they are unchanged from previous note.  ALLERGIES:  has No Known Allergies.  MEDICATIONS:  Current Outpatient Medications  Medication Sig Dispense Refill  . acetaminophen (TYLENOL) 500 MG tablet Take 1,000 mg by mouth every 6 (six) hours as needed.    .Marland Kitchenaspirin EC 81 MG tablet Take 81 mg by mouth daily.    .Marland Kitchenatorvastatin (LIPITOR) 20 MG tablet Take 20 mg by mouth every evening.     . Calcium Carbonate-Vitamin D (CALCIUM-VITAMIN D) 500-200 MG-UNIT per tablet Take 1 tablet by mouth daily.    . citalopram (CELEXA) 20 MG tablet Take 20 mg by mouth at bedtime.     .Marland Kitchenestradiol (ESTRACE VAGINAL) 0.1 MG/GM vaginal cream Place 1 Applicatorful vaginally 3 (three) times a week. 42.5 g 12  . gabapentin (NEURONTIN) 600 MG tablet Take 600 mg by mouth at bedtime as needed.    .Marland Kitchenlevothyroxine (SYNTHROID, LEVOTHROID) 100 MCG tablet Take 100 mcg by mouth daily before breakfast.     . lidocaine-prilocaine (EMLA) cream Apply to affected area once 30 g  3  . losartan-hydrochlorothiazide (HYZAAR) 100-25 MG tablet     . magnesium oxide (MAG-OX) 400 (241.3 Mg) MG tablet Take 1 tablet (400 mg total) by mouth daily. 30 tablet 11  . metoprolol succinate (TOPROL-XL) 25  MG 24 hr tablet Take 25 mg by mouth daily.    . Multiple Vitamin (MULTIVITAMIN WITH MINERALS) TABS Take 1 tablet by mouth daily.    . ondansetron (ZOFRAN) 8 MG tablet Take 1 tablet (8 mg total) by mouth every 8 (eight) hours as needed. Start on the third day after chemotherapy. 30 tablet 1  . pantoprazole (PROTONIX) 40 MG tablet Take 40 mg by mouth every morning.  (Patient not taking: Reported on 02/15/2020)    . valACYclovir (VALTREX) 1000 MG tablet valacyclovir 1 gram tablet     No current facility-administered medications for this visit.   Facility-Administered Medications Ordered in Other Visits  Medication Dose Route Frequency Provider Last Rate Last Admin  . heparin lock flush 100 unit/mL  500 Units Intracatheter Once PRN Alvy Bimler, Ceriah Kohler, MD      . sodium chloride flush (NS) 0.9 % injection 10 mL  10 mL Intracatheter PRN Alvy Bimler, Sergio Zawislak, MD        PHYSICAL EXAMINATION: ECOG PERFORMANCE STATUS: 1 - Symptomatic but completely ambulatory  Vitals:   05/09/20 1210  BP: (!) 119/43  Pulse: 74  Resp: 18  Temp: 98.1 F (36.7 C)  SpO2: 95%   Filed Weights   05/09/20 1210  Weight: 138 lb 6.4 oz (62.8 kg)    GENERAL:alert, no distress and comfortable SKIN: skin color, texture, turgor are normal, no rashes or significant lesions EYES: normal, Conjunctiva are pink and non-injected, sclera clear OROPHARYNX:no exudate, no erythema and lips, buccal mucosa, and tongue normal  NECK: supple, thyroid normal size, non-tender, without nodularity LYMPH:  no palpable lymphadenopathy in the cervical, axillary or inguinal LUNGS: clear to auscultation and percussion with normal breathing effort HEART: regular rate & rhythm and no murmurs and no lower extremity edema ABDOMEN:abdomen soft, non-tender and normal bowel sounds Musculoskeletal:no cyanosis of digits and no clubbing  NEURO: alert & oriented x 3 with fluent speech, no focal motor/sensory deficits  LABORATORY DATA:  I have reviewed the data as  listed    Component Value Date/Time   NA 136 05/09/2020 1140   NA 139 02/13/2017 1512   K 3.2 (L) 05/09/2020 1140   K 4.2 02/13/2017 1512   CL 102 05/09/2020 1140   CO2 25 05/09/2020 1140   CO2 28 02/13/2017 1512   GLUCOSE 119 (H) 05/09/2020 1140   GLUCOSE 118 02/13/2017 1512   BUN 28 (H) 05/09/2020 1140   BUN 25.2 02/13/2017 1512   CREATININE 1.41 (H) 05/09/2020 1140   CREATININE 1.37 (H) 12/21/2019 0850   CREATININE 1.2 (H) 02/13/2017 1512   CALCIUM 9.6 05/09/2020 1140   CALCIUM 10.0 02/13/2017 1512   PROT 6.7 05/09/2020 1140   ALBUMIN 3.9 05/09/2020 1140   AST 19 05/09/2020 1140   AST 19 12/21/2019 0850   ALT 15 05/09/2020 1140   ALT 13 12/21/2019 0850   ALKPHOS 67 05/09/2020 1140   BILITOT 0.5 05/09/2020 1140   BILITOT 0.4 12/21/2019 0850   GFRNONAA 38 (L) 05/09/2020 1140   GFRNONAA 37 (L) 12/21/2019 0850   GFRAA 47 (L) 03/28/2020 0933   GFRAA 42 (L) 12/21/2019 0850    No results found for: SPEP, UPEP  Lab Results  Component Value Date   WBC 5.7 05/09/2020   NEUTROABS 3.7 05/09/2020  HGB 10.7 (L) 05/09/2020   HCT 30.2 (L) 05/09/2020   MCV 89.9 05/09/2020   PLT 200 05/09/2020      Chemistry      Component Value Date/Time   NA 136 05/09/2020 1140   NA 139 02/13/2017 1512   K 3.2 (L) 05/09/2020 1140   K 4.2 02/13/2017 1512   CL 102 05/09/2020 1140   CO2 25 05/09/2020 1140   CO2 28 02/13/2017 1512   BUN 28 (H) 05/09/2020 1140   BUN 25.2 02/13/2017 1512   CREATININE 1.41 (H) 05/09/2020 1140   CREATININE 1.37 (H) 12/21/2019 0850   CREATININE 1.2 (H) 02/13/2017 1512      Component Value Date/Time   CALCIUM 9.6 05/09/2020 1140   CALCIUM 10.0 02/13/2017 1512   ALKPHOS 67 05/09/2020 1140   AST 19 05/09/2020 1140   AST 19 12/21/2019 0850   ALT 15 05/09/2020 1140   ALT 13 12/21/2019 0850   BILITOT 0.5 05/09/2020 1140   BILITOT 0.4 12/21/2019 0850       RADIOGRAPHIC STUDIES: I have personally reviewed the radiological images as listed and agreed  with the findings in the report. ECHOCARDIOGRAM COMPLETE  Result Date: 04/17/2020    ECHOCARDIOGRAM REPORT   Patient Name:   [name] Date of Exam: 04/17/2020 Medical Rec #:  [id number]      Height:       62.2 in Accession #:    [id number]     Weight:       135.8 lb Date of Birth:  Aug 20, 1940      BSA:          1.625 m Patient Age:    79 years       BP:           227/76 mmHg Patient Gender: F              HR:           70 bpm. Exam Location:  Outpatient Procedure: 2D Echo, 3D Echo, Cardiac Doppler, Color Doppler and Strain Analysis Indications:    Z51.11 Encounter for antineoplastic chemotheraphy  History:        Patient has prior history of Echocardiogram examinations, most                 recent 01/07/2020. Signs/Symptoms:Chest Pain; Risk                 Factors:Hypertension and Dyslipidemia. Breast cancer. Chemo.  Sonographer:    Roseanna Rainbow Referring Phys: [id number] NI [place]  Sonographer Comments: Image acquisition challenging due to breast implants. Global longitudinal strain was attempted. IMPRESSIONS  1. Normal LVEF, normal and unchanged GLS: -20.2%.  2. Left ventricular ejection fraction, by estimation, is 60 to 65%. The left ventricle has normal function. The left ventricle has no regional wall motion abnormalities. There is mild concentric left ventricular hypertrophy. Left ventricular diastolic parameters are consistent with Grade I diastolic dysfunction (impaired relaxation). The average left ventricular global longitudinal strain is -20.2 %. The global longitudinal strain is normal.  3. Right ventricular systolic function is normal. The right ventricular size is normal.  4. The mitral valve is normal in structure. Mild mitral valve regurgitation. No evidence of mitral stenosis.  5. The aortic valve is normal in structure. There is mild calcification of the aortic valve. There is mild thickening of the aortic valve. Aortic valve regurgitation is mild. No aortic stenosis is present.  6. The inferior  vena cava is normal in  size with greater than 50% respiratory variability, suggesting right atrial pressure of 3 mmHg. FINDINGS  Left Ventricle: Left ventricular ejection fraction, by estimation, is 60 to 65%. The left ventricle has normal function. The left ventricle has no regional wall motion abnormalities. The average left ventricular global longitudinal strain is -20.2 %. The global longitudinal strain is normal. The left ventricular internal cavity size was normal in size. There is mild concentric left ventricular hypertrophy. Left ventricular diastolic parameters are consistent with Grade I diastolic dysfunction (impaired relaxation). Normal left ventricular filling pressure. Right Ventricle: The right ventricular size is normal. No increase in right ventricular wall thickness. Right ventricular systolic function is normal. Left Atrium: Left atrial size was normal in size. Right Atrium: Right atrial size was normal in size. Pericardium: There is no evidence of pericardial effusion. Mitral Valve: The mitral valve is normal in structure. Mild mitral valve regurgitation. No evidence of mitral valve stenosis. Tricuspid Valve: The tricuspid valve is normal in structure. Tricuspid valve regurgitation is mild . No evidence of tricuspid stenosis. Aortic Valve: The aortic valve is normal in structure. There is mild calcification of the aortic valve. There is mild thickening of the aortic valve. Aortic valve regurgitation is mild. No aortic stenosis is present. Pulmonic Valve: The pulmonic valve was normal in structure. Pulmonic valve regurgitation is not visualized. No evidence of pulmonic stenosis. Aorta: The aortic root is normal in size and structure. Venous: The inferior vena cava is normal in size with greater than 50% respiratory variability, suggesting right atrial pressure of 3 mmHg. IAS/Shunts: No atrial level shunt detected by color flow Doppler.  LEFT VENTRICLE PLAX 2D LVIDd:         3.00 cm     Diastology  LVIDs:         2.10 cm     LV e' medial:    5.22 cm/s LV PW:         1.30 cm     LV E/e' medial:  11.1 LV IVS:        1.30 cm     LV e' lateral:   7.94 cm/s LVOT diam:     1.80 cm     LV E/e' lateral: 7.3 LV SV:         38 LV SV Index:   23          2D Longitudinal Strain LVOT Area:     2.54 cm    2D Strain GLS Avg:     -20.2 %  LV Volumes (MOD) LV vol d, MOD A2C: 71.4 ml LV vol d, MOD A4C: 51.2 ml LV vol s, MOD A2C: 24.9 ml LV vol s, MOD A4C: 19.9 ml LV SV MOD A2C:     46.5 ml LV SV MOD A4C:     51.2 ml LV SV MOD BP:      37.2 ml RIGHT VENTRICLE            IVC RV S prime:     9.79 cm/s  IVC diam: 0.80 cm TAPSE (M-mode): 1.6 cm LEFT ATRIUM             Index       RIGHT ATRIUM           Index LA diam:        4.10 cm 2.52 cm/m  RA Area:     12.30 cm LA Vol (A2C):   32.5 ml 20.00 ml/m RA Volume:   24.20 ml  14.89 ml/m  LA Vol (A4C):   20.6 ml 12.67 ml/m LA Biplane Vol: 26.6 ml 16.37 ml/m  AORTIC VALVE LVOT Vmax:   73.10 cm/s LVOT Vmean:  42.000 cm/s LVOT VTI:    0.150 m  AORTA Ao Root diam: 3.20 cm MITRAL VALVE MV Area (PHT): 2.95 cm    SHUNTS MV Decel Time: 257 msec    Systemic VTI:  0.15 m MV E velocity: 57.80 cm/s  Systemic Diam: 1.80 cm MV A velocity: 67.30 cm/s MV E/A ratio:  0.86 Ena Dawley MD Electronically signed by Ena Dawley MD Signature Date/Time: 04/17/2020/10:39:26 AM    Final

## 2020-05-09 NOTE — Patient Instructions (Signed)
[facility] Discharge Instructions for Patients Receiving Chemotherapy  Today you received the following chemotherapy agents: trastuzumab  To help prevent nausea and vomiting after your treatment, we encourage you to take your nausea medication as directed.    If you develop nausea and vomiting that is not controlled by your nausea medication, call the clinic.   BELOW ARE SYMPTOMS THAT SHOULD BE REPORTED IMMEDIATELY:  *FEVER GREATER THAN 100.5 F  *CHILLS WITH OR WITHOUT FEVER  NAUSEA AND VOMITING THAT IS NOT CONTROLLED WITH YOUR NAUSEA MEDICATION  *UNUSUAL SHORTNESS OF BREATH  *UNUSUAL BRUISING OR BLEEDING  TENDERNESS IN MOUTH AND THROAT WITH OR WITHOUT PRESENCE OF ULCERS  *URINARY PROBLEMS  *BOWEL PROBLEMS  UNUSUAL RASH Items with * indicate a potential emergency and should be followed up as soon as possible.  Feel free to call the clinic should you have any questions or concerns. The clinic phone number is [phone].  Please show the [place] at check-in to the Emergency Department and triage nurse.   Hypomagnesemia Hypomagnesemia is a condition in which the level of magnesium in the blood is low. Magnesium is a mineral that is found in many foods. It is used in many different processes in the body. Hypomagnesemia can affect every organ in the body. In severe cases, it can cause life-threatening problems. What are the causes? This condition may be caused by:  Not getting enough magnesium in your diet.  Malnutrition.  Problems with absorbing magnesium from the intestines.  Dehydration.  Alcohol abuse.  Vomiting.  Severe or chronic diarrhea.  Some medicines, including medicines that make you urinate more (diuretics).  Certain diseases, such as kidney disease, diabetes, celiac disease, and overactive thyroid. What are the signs or symptoms? Symptoms of this condition include:  Loss of appetite.  Nausea and  vomiting.  Involuntary shaking or trembling of a body part (tremor).  Muscle weakness.  Tingling in the arms and legs.  Sudden tightening of muscles (muscle spasms).  Confusion.  Psychiatric issues, such as depression, irritability, or psychosis.  A feeling of fluttering of the heart.  Seizures. These symptoms are more severe if magnesium levels drop suddenly. How is this diagnosed? This condition may be diagnosed based on:  Your symptoms and medical history.  A physical exam.  Blood and urine tests. How is this treated? Treatment depends on the cause and the severity of the condition. It may be treated with:  A magnesium supplement. This can be taken in pill form. If the condition is severe, magnesium is usually given through an IV.  Changes to your diet. You may be directed to eat foods that have a lot of magnesium, such as green leafy vegetables, peas, beans, and nuts.  Stopping any intake of alcohol. Follow these instructions at home:      Make sure that your diet includes foods with magnesium. Foods that have a lot of magnesium in them include: ? Green leafy vegetables, such as spinach and broccoli. ? Beans and peas. ? Nuts and seeds, such as almonds and sunflower seeds. ? Whole grains, such as whole grain bread and fortified cereals.  Take magnesium supplements if your health care provider tells you to do that. Take them as directed.  Take over-the-counter and prescription medicines only as told by your health care provider.  Have your magnesium levels monitored as told by your health care provider.  When you are active, drink fluids that contain electrolytes.  Avoid drinking alcohol.  Keep all  follow-up visits as told by your health care provider. This is important. Contact a health care provider if:  You get worse instead of better.  Your symptoms return. Get help right away if you:  Develop severe muscle weakness.  Have trouble  breathing.  Feel that your heart is racing. Summary  Hypomagnesemia is a condition in which the level of magnesium in the blood is low.  Hypomagnesemia can affect every organ in the body.  Treatment may include eating more foods that contain magnesium, taking magnesium supplements, and not drinking alcohol.  Have your magnesium levels monitored as told by your health care provider. This information is not intended to replace advice given to you by your health care provider. Make sure you discuss any questions you have with your health care provider. Document Revised: 06/13/2017 Document Reviewed: 06/02/2017 Elsevier Patient Education  2020 [employer].

## 2020-05-09 NOTE — Assessment & Plan Note (Signed)
Renal function is stable. Monitor closely She was seen by nephrologist recently 

## 2020-05-09 NOTE — Assessment & Plan Note (Signed)
She has history of hypomagnesemia We have stopped giving her IV magnesium for a while She is mildly hypokalemic today I recommend we proceed with magnesium infusion today

## 2020-05-16 DIAGNOSIS — R69 Illness, unspecified: Secondary | ICD-10-CM | POA: Diagnosis not present

## 2020-05-17 DIAGNOSIS — N1832 Chronic kidney disease, stage 3b: Secondary | ICD-10-CM | POA: Diagnosis not present

## 2020-05-18 DIAGNOSIS — L718 Other rosacea: Secondary | ICD-10-CM | POA: Diagnosis not present

## 2020-05-18 DIAGNOSIS — Z85828 Personal history of other malignant neoplasm of skin: Secondary | ICD-10-CM | POA: Diagnosis not present

## 2020-05-18 DIAGNOSIS — D485 Neoplasm of uncertain behavior of skin: Secondary | ICD-10-CM | POA: Diagnosis not present

## 2020-05-18 DIAGNOSIS — L570 Actinic keratosis: Secondary | ICD-10-CM | POA: Diagnosis not present

## 2020-05-18 DIAGNOSIS — D044 Carcinoma in situ of skin of scalp and neck: Secondary | ICD-10-CM | POA: Diagnosis not present

## 2020-05-22 ENCOUNTER — Telehealth: Payer: Self-pay | Admitting: Hematology and Oncology

## 2020-05-22 ENCOUNTER — Telehealth: Payer: Self-pay

## 2020-05-22 NOTE — Telephone Encounter (Signed)
Called and given below message. She verbalized understanding. Scheduling message sent to move appt as requested.

## 2020-05-22 NOTE — Telephone Encounter (Signed)
1) ok to move appt if available 2) Continue mask and hand washing to her best ability if she were to fly

## 2020-05-22 NOTE — Telephone Encounter (Signed)
She called and left a message. She is asking she move 11/16 appts to 11/15? She has been meaning to ask. Do you think it is safe to fly?

## 2020-05-22 NOTE — Telephone Encounter (Signed)
Called pt per 11/8 sch msg - no answer. Left message for patient to call back to reschedule appt.

## 2020-05-25 DIAGNOSIS — E785 Hyperlipidemia, unspecified: Secondary | ICD-10-CM | POA: Diagnosis not present

## 2020-05-25 DIAGNOSIS — N1832 Chronic kidney disease, stage 3b: Secondary | ICD-10-CM | POA: Diagnosis not present

## 2020-05-25 DIAGNOSIS — I129 Hypertensive chronic kidney disease with stage 1 through stage 4 chronic kidney disease, or unspecified chronic kidney disease: Secondary | ICD-10-CM | POA: Diagnosis not present

## 2020-05-25 DIAGNOSIS — C541 Malignant neoplasm of endometrium: Secondary | ICD-10-CM | POA: Diagnosis not present

## 2020-05-25 DIAGNOSIS — D472 Monoclonal gammopathy: Secondary | ICD-10-CM | POA: Diagnosis not present

## 2020-05-29 ENCOUNTER — Telehealth: Payer: Self-pay

## 2020-05-29 ENCOUNTER — Other Ambulatory Visit: Payer: Self-pay

## 2020-05-29 ENCOUNTER — Inpatient Hospital Stay: Payer: Medicare HMO

## 2020-05-29 ENCOUNTER — Inpatient Hospital Stay: Payer: Medicare HMO | Attending: Gynecologic Oncology

## 2020-05-29 ENCOUNTER — Other Ambulatory Visit: Payer: Self-pay | Admitting: Hematology and Oncology

## 2020-05-29 VITALS — BP 139/57 | HR 69 | Temp 97.7°F | Resp 18 | Wt 130.8 lb

## 2020-05-29 DIAGNOSIS — E876 Hypokalemia: Secondary | ICD-10-CM | POA: Diagnosis not present

## 2020-05-29 DIAGNOSIS — Z5112 Encounter for antineoplastic immunotherapy: Secondary | ICD-10-CM | POA: Diagnosis present

## 2020-05-29 DIAGNOSIS — C541 Malignant neoplasm of endometrium: Secondary | ICD-10-CM

## 2020-05-29 DIAGNOSIS — C779 Secondary and unspecified malignant neoplasm of lymph node, unspecified: Secondary | ICD-10-CM | POA: Insufficient documentation

## 2020-05-29 LAB — COMPREHENSIVE METABOLIC PANEL WITH GFR
ALT: 15 U/L (ref 0–44)
AST: 20 U/L (ref 15–41)
Albumin: 3.9 g/dL (ref 3.5–5.0)
Alkaline Phosphatase: 71 U/L (ref 38–126)
Anion gap: 10 (ref 5–15)
BUN: 24 mg/dL — ABNORMAL HIGH (ref 8–23)
CO2: 26 mmol/L (ref 22–32)
Calcium: 9.3 mg/dL (ref 8.9–10.3)
Chloride: 102 mmol/L (ref 98–111)
Creatinine, Ser: 1.47 mg/dL — ABNORMAL HIGH (ref 0.44–1.00)
GFR, Estimated: 36 mL/min — ABNORMAL LOW
Glucose, Bld: 131 mg/dL — ABNORMAL HIGH (ref 70–99)
Potassium: 3.3 mmol/L — ABNORMAL LOW (ref 3.5–5.1)
Sodium: 138 mmol/L (ref 135–145)
Total Bilirubin: 0.5 mg/dL (ref 0.3–1.2)
Total Protein: 6.9 g/dL (ref 6.5–8.1)

## 2020-05-29 LAB — CBC WITH DIFFERENTIAL/PLATELET
Abs Immature Granulocytes: 0.03 K/uL (ref 0.00–0.07)
Basophils Absolute: 0.1 K/uL (ref 0.0–0.1)
Basophils Relative: 1 %
Eosinophils Absolute: 0.2 K/uL (ref 0.0–0.5)
Eosinophils Relative: 2 %
HCT: 30.4 % — ABNORMAL LOW (ref 36.0–46.0)
Hemoglobin: 10.7 g/dL — ABNORMAL LOW (ref 12.0–15.0)
Immature Granulocytes: 1 %
Lymphocytes Relative: 21 %
Lymphs Abs: 1.3 K/uL (ref 0.7–4.0)
MCH: 31.9 pg (ref 26.0–34.0)
MCHC: 35.2 g/dL (ref 30.0–36.0)
MCV: 90.7 fL (ref 80.0–100.0)
Monocytes Absolute: 0.5 K/uL (ref 0.1–1.0)
Monocytes Relative: 9 %
Neutro Abs: 4.2 K/uL (ref 1.7–7.7)
Neutrophils Relative %: 66 %
Platelets: 191 K/uL (ref 150–400)
RBC: 3.35 MIL/uL — ABNORMAL LOW (ref 3.87–5.11)
RDW: 11.9 % (ref 11.5–15.5)
WBC: 6.3 K/uL (ref 4.0–10.5)
nRBC: 0 % (ref 0.0–0.2)

## 2020-05-29 LAB — MAGNESIUM: Magnesium: 0.9 mg/dL — CL (ref 1.7–2.4)

## 2020-05-29 MED ORDER — DIPHENHYDRAMINE HCL 25 MG PO CAPS
ORAL_CAPSULE | ORAL | Status: AC
  Filled 2020-05-29: qty 1

## 2020-05-29 MED ORDER — ACETAMINOPHEN 325 MG PO TABS
ORAL_TABLET | ORAL | Status: AC
  Filled 2020-05-29: qty 2

## 2020-05-29 MED ORDER — SODIUM CHLORIDE 0.9 % IV SOLN
Freq: Once | INTRAVENOUS | Status: AC
  Filled 2020-05-29: qty 250

## 2020-05-29 MED ORDER — DIPHENHYDRAMINE HCL 25 MG PO TABS
25.0000 mg | ORAL_TABLET | Freq: Once | ORAL | Status: AC
  Administered 2020-05-29: 25 mg via ORAL
  Filled 2020-05-29: qty 1

## 2020-05-29 MED ORDER — ACETAMINOPHEN 325 MG PO TABS
650.0000 mg | ORAL_TABLET | Freq: Once | ORAL | Status: AC
  Administered 2020-05-29: 650 mg via ORAL

## 2020-05-29 MED ORDER — SODIUM CHLORIDE 0.9% FLUSH
10.0000 mL | INTRAVENOUS | Status: DC | PRN
  Administered 2020-05-29: 10 mL
  Filled 2020-05-29: qty 10

## 2020-05-29 MED ORDER — TRASTUZUMAB-DKST CHEMO 150 MG IV SOLR
6.0000 mg/kg | Freq: Once | INTRAVENOUS | Status: AC
  Administered 2020-05-29: 357 mg via INTRAVENOUS
  Filled 2020-05-29: qty 17

## 2020-05-29 MED ORDER — MAGNESIUM SULFATE 2 GM/50ML IV SOLN
INTRAVENOUS | Status: AC
  Filled 2020-05-29: qty 50

## 2020-05-29 MED ORDER — MAGNESIUM SULFATE 2 GM/50ML IV SOLN
2.0000 g | Freq: Once | INTRAVENOUS | Status: AC
  Administered 2020-05-29: 2 g via INTRAVENOUS

## 2020-05-29 MED ORDER — SODIUM CHLORIDE 0.9% FLUSH
10.0000 mL | Freq: Once | INTRAVENOUS | Status: AC
  Administered 2020-05-29: 10 mL
  Filled 2020-05-29: qty 10

## 2020-05-29 MED ORDER — HEPARIN SOD (PORK) LOCK FLUSH 100 UNIT/ML IV SOLN
500.0000 [IU] | Freq: Once | INTRAVENOUS | Status: AC | PRN
  Administered 2020-05-29: 500 [IU]
  Filled 2020-05-29: qty 5

## 2020-05-29 NOTE — Telephone Encounter (Signed)
CRITICAL VALUE STICKER  CRITICAL VALUE: Magnesium = 0.9  RECEIVER (on-site recipient of call): Yetta Glassman, [place] NOTIFIED: 05/29/20 at 1:50pm  MESSENGER (representative from lab): Ulice Dash  MD NOTIFIED: Dr. Alvy Bimler  TIME OF NOTIFICATION: 05/29/20 at 155:pm  RESPONSE: Per RN Harrel Lemon, orders submitted for IV Magnesium by provider.

## 2020-05-29 NOTE — Patient Instructions (Signed)

## 2020-05-29 NOTE — Patient Instructions (Signed)
[facility] Discharge Instructions for Patients Receiving Chemotherapy  Today you received the following chemotherapy agents trastuzumab.  To help prevent nausea and vomiting after your treatment, we encourage you to take your nausea medication as directed.    If you develop nausea and vomiting that is not controlled by your nausea medication, call the clinic.   BELOW ARE SYMPTOMS THAT SHOULD BE REPORTED IMMEDIATELY:  *FEVER GREATER THAN 100.5 F  *CHILLS WITH OR WITHOUT FEVER  NAUSEA AND VOMITING THAT IS NOT CONTROLLED WITH YOUR NAUSEA MEDICATION  *UNUSUAL SHORTNESS OF BREATH  *UNUSUAL BRUISING OR BLEEDING  TENDERNESS IN MOUTH AND THROAT WITH OR WITHOUT PRESENCE OF ULCERS  *URINARY PROBLEMS  *BOWEL PROBLEMS  UNUSUAL RASH Items with * indicate a potential emergency and should be followed up as soon as possible.  Feel free to call the clinic should you have any questions or concerns. The clinic phone number is [phone].  Please show the CHEMO ALERT CARD at check-in to the Emergency Department and triage nurse.   

## 2020-05-29 NOTE — Progress Notes (Signed)
Reported critical magnesium 0.9 to Dr. Alvy Bimler.

## 2020-05-30 ENCOUNTER — Inpatient Hospital Stay: Payer: Medicare HMO

## 2020-06-20 ENCOUNTER — Inpatient Hospital Stay: Payer: Medicare HMO | Admitting: Hematology and Oncology

## 2020-06-20 ENCOUNTER — Other Ambulatory Visit: Payer: Self-pay

## 2020-06-20 ENCOUNTER — Inpatient Hospital Stay: Payer: Medicare HMO

## 2020-06-20 ENCOUNTER — Telehealth: Payer: Self-pay

## 2020-06-20 ENCOUNTER — Inpatient Hospital Stay: Payer: Medicare HMO | Attending: Gynecologic Oncology

## 2020-06-20 DIAGNOSIS — Z853 Personal history of malignant neoplasm of breast: Secondary | ICD-10-CM | POA: Insufficient documentation

## 2020-06-20 DIAGNOSIS — Z9221 Personal history of antineoplastic chemotherapy: Secondary | ICD-10-CM | POA: Insufficient documentation

## 2020-06-20 DIAGNOSIS — Z90722 Acquired absence of ovaries, bilateral: Secondary | ICD-10-CM | POA: Insufficient documentation

## 2020-06-20 DIAGNOSIS — D630 Anemia in neoplastic disease: Secondary | ICD-10-CM | POA: Diagnosis not present

## 2020-06-20 DIAGNOSIS — R918 Other nonspecific abnormal finding of lung field: Secondary | ICD-10-CM | POA: Insufficient documentation

## 2020-06-20 DIAGNOSIS — N183 Chronic kidney disease, stage 3 unspecified: Secondary | ICD-10-CM | POA: Insufficient documentation

## 2020-06-20 DIAGNOSIS — Z90710 Acquired absence of both cervix and uterus: Secondary | ICD-10-CM | POA: Diagnosis not present

## 2020-06-20 DIAGNOSIS — Z5112 Encounter for antineoplastic immunotherapy: Secondary | ICD-10-CM | POA: Diagnosis not present

## 2020-06-20 DIAGNOSIS — C541 Malignant neoplasm of endometrium: Secondary | ICD-10-CM

## 2020-06-20 DIAGNOSIS — Z7982 Long term (current) use of aspirin: Secondary | ICD-10-CM | POA: Diagnosis not present

## 2020-06-20 DIAGNOSIS — Z9079 Acquired absence of other genital organ(s): Secondary | ICD-10-CM | POA: Insufficient documentation

## 2020-06-20 DIAGNOSIS — C779 Secondary and unspecified malignant neoplasm of lymph node, unspecified: Secondary | ICD-10-CM | POA: Insufficient documentation

## 2020-06-20 DIAGNOSIS — D631 Anemia in chronic kidney disease: Secondary | ICD-10-CM | POA: Diagnosis not present

## 2020-06-20 DIAGNOSIS — Z79899 Other long term (current) drug therapy: Secondary | ICD-10-CM | POA: Diagnosis not present

## 2020-06-20 LAB — CBC WITH DIFFERENTIAL/PLATELET
Abs Immature Granulocytes: 0.03 10*3/uL (ref 0.00–0.07)
Basophils Absolute: 0.1 10*3/uL (ref 0.0–0.1)
Basophils Relative: 1 %
Eosinophils Absolute: 0.2 10*3/uL (ref 0.0–0.5)
Eosinophils Relative: 4 %
HCT: 31.8 % — ABNORMAL LOW (ref 36.0–46.0)
Hemoglobin: 11.1 g/dL — ABNORMAL LOW (ref 12.0–15.0)
Immature Granulocytes: 1 %
Lymphocytes Relative: 20 %
Lymphs Abs: 1.3 10*3/uL (ref 0.7–4.0)
MCH: 32 pg (ref 26.0–34.0)
MCHC: 34.9 g/dL (ref 30.0–36.0)
MCV: 91.6 fL (ref 80.0–100.0)
Monocytes Absolute: 0.7 10*3/uL (ref 0.1–1.0)
Monocytes Relative: 11 %
Neutro Abs: 4.1 10*3/uL (ref 1.7–7.7)
Neutrophils Relative %: 63 %
Platelets: 216 10*3/uL (ref 150–400)
RBC: 3.47 MIL/uL — ABNORMAL LOW (ref 3.87–5.11)
RDW: 11.9 % (ref 11.5–15.5)
WBC: 6.4 10*3/uL (ref 4.0–10.5)
nRBC: 0 % (ref 0.0–0.2)

## 2020-06-20 LAB — COMPREHENSIVE METABOLIC PANEL
ALT: 15 U/L (ref 0–44)
AST: 19 U/L (ref 15–41)
Albumin: 3.8 g/dL (ref 3.5–5.0)
Alkaline Phosphatase: 72 U/L (ref 38–126)
Anion gap: 10 (ref 5–15)
BUN: 22 mg/dL (ref 8–23)
CO2: 26 mmol/L (ref 22–32)
Calcium: 9.4 mg/dL (ref 8.9–10.3)
Chloride: 103 mmol/L (ref 98–111)
Creatinine, Ser: 1.47 mg/dL — ABNORMAL HIGH (ref 0.44–1.00)
GFR, Estimated: 36 mL/min — ABNORMAL LOW (ref >60)
Glucose, Bld: 99 mg/dL (ref 70–99)
Potassium: 4 mmol/L (ref 3.5–5.1)
Sodium: 139 mmol/L (ref 135–145)
Total Bilirubin: 0.4 mg/dL (ref 0.3–1.2)
Total Protein: 7 g/dL (ref 6.5–8.1)

## 2020-06-20 LAB — MAGNESIUM: Magnesium: 0.9 mg/dL — CL (ref 1.7–2.4)

## 2020-06-20 MED ORDER — SODIUM CHLORIDE 0.9 % IV SOLN
Freq: Once | INTRAVENOUS | Status: AC
  Filled 2020-06-20: qty 250

## 2020-06-20 MED ORDER — SODIUM CHLORIDE 0.9% FLUSH
10.0000 mL | INTRAVENOUS | Status: DC | PRN
  Administered 2020-06-20: 10 mL
  Filled 2020-06-20: qty 10

## 2020-06-20 MED ORDER — DIPHENHYDRAMINE HCL 25 MG PO CAPS
ORAL_CAPSULE | ORAL | Status: AC
  Filled 2020-06-20: qty 1

## 2020-06-20 MED ORDER — DIPHENHYDRAMINE HCL 25 MG PO TABS
25.0000 mg | ORAL_TABLET | Freq: Once | ORAL | Status: AC
  Administered 2020-06-20: 25 mg via ORAL
  Filled 2020-06-20: qty 1

## 2020-06-20 MED ORDER — TRASTUZUMAB-DKST CHEMO 150 MG IV SOLR
6.0000 mg/kg | Freq: Once | INTRAVENOUS | Status: AC
  Administered 2020-06-20: 357 mg via INTRAVENOUS
  Filled 2020-06-20: qty 17

## 2020-06-20 MED ORDER — HEPARIN SOD (PORK) LOCK FLUSH 100 UNIT/ML IV SOLN
500.0000 [IU] | Freq: Once | INTRAVENOUS | Status: AC | PRN
  Administered 2020-06-20: 500 [IU]
  Filled 2020-06-20: qty 5

## 2020-06-20 MED ORDER — ACETAMINOPHEN 325 MG PO TABS
ORAL_TABLET | ORAL | Status: AC
  Filled 2020-06-20: qty 2

## 2020-06-20 MED ORDER — ACETAMINOPHEN 325 MG PO TABS
650.0000 mg | ORAL_TABLET | Freq: Once | ORAL | Status: AC
  Administered 2020-06-20: 650 mg via ORAL

## 2020-06-20 NOTE — Telephone Encounter (Signed)
CRITICAL VALUE STICKER  CRITICAL VALUE: Magnesium = 0.9  RECEIVER (on-site recipient of call): Yetta Glassman, CMA  DATE & TIME NOTIFIED: 06/20/20 at 11:25am  MESSENGER (representative from lab): Rosann Auerbach  MD NOTIFIED: Dr. Alvy Bimler  TIME OF NOTIFICATION:   RESPONSE: Notification given to Hassan Rowan, RN for follow-up with the doctor.

## 2020-06-20 NOTE — Patient Instructions (Signed)
[facility] Discharge Instructions for Patients Receiving Chemotherapy  Today you received the following chemotherapy agents trastuzumab.  To help prevent nausea and vomiting after your treatment, we encourage you to take your nausea medication as directed.    If you develop nausea and vomiting that is not controlled by your nausea medication, call the clinic.   BELOW ARE SYMPTOMS THAT SHOULD BE REPORTED IMMEDIATELY:  *FEVER GREATER THAN 100.5 F  *CHILLS WITH OR WITHOUT FEVER  NAUSEA AND VOMITING THAT IS NOT CONTROLLED WITH YOUR NAUSEA MEDICATION  *UNUSUAL SHORTNESS OF BREATH  *UNUSUAL BRUISING OR BLEEDING  TENDERNESS IN MOUTH AND THROAT WITH OR WITHOUT PRESENCE OF ULCERS  *URINARY PROBLEMS  *BOWEL PROBLEMS  UNUSUAL RASH Items with * indicate a potential emergency and should be followed up as soon as possible.  Feel free to call the clinic should you have any questions or concerns. The clinic phone number is [phone].  Please show the CHEMO ALERT CARD at check-in to the Emergency Department and triage nurse.   

## 2020-06-20 NOTE — Progress Notes (Signed)
Patient completed infusion without incident. In no visible distress at time of discharge. Ambulated out of cancer center. AVS provided.  

## 2020-06-20 NOTE — Patient Instructions (Signed)

## 2020-06-21 ENCOUNTER — Encounter: Payer: Self-pay | Admitting: Hematology and Oncology

## 2020-06-21 DIAGNOSIS — E039 Hypothyroidism, unspecified: Secondary | ICD-10-CM | POA: Diagnosis not present

## 2020-06-21 DIAGNOSIS — H524 Presbyopia: Secondary | ICD-10-CM | POA: Diagnosis not present

## 2020-06-21 DIAGNOSIS — E785 Hyperlipidemia, unspecified: Secondary | ICD-10-CM | POA: Diagnosis not present

## 2020-06-21 DIAGNOSIS — H02831 Dermatochalasis of right upper eyelid: Secondary | ICD-10-CM | POA: Diagnosis not present

## 2020-06-21 DIAGNOSIS — H02834 Dermatochalasis of left upper eyelid: Secondary | ICD-10-CM | POA: Diagnosis not present

## 2020-06-21 DIAGNOSIS — Z961 Presence of intraocular lens: Secondary | ICD-10-CM | POA: Diagnosis not present

## 2020-06-21 NOTE — Progress Notes (Signed)
[place] OFFICE PROGRESS NOTE  Patient Care Team: Burnard Bunting, [name]  ASSESSMENT & PLAN:  Endometrial cancer [facility]) Her last CT imaging showed excellent response to therapy The plan will be to continue [name] indefinitely I recommend CT imaging every 6 months, next due around March 2022 She will be due for echocardiogram early next year She saw GYN surgeon recently.  Per recommendation, she will be closely followed at [facility]  CKD (chronic kidney disease), stage III ([place]) Renal function is stable. Monitor closely She was seen by nephrologist recently  Hypomagnesemia She has history of hypomagnesemia She is not symptomatic She will get IV magnesium if she has coexisting hypokalemia  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   All questions were answered. The patient knows to call the clinic with any problems, questions or concerns. The total time spent in the appointment was 20 minutes encounter with patients including review of chart and various tests results, discussions about plan of care and coordination of care plan   [name], MD 12/80/2021 9:27 AM  INTERVAL HISTORY: Please see below for problem oriented charting. She returns for further follow-up She is doing well No new lymphadenopathy Denies infusion reactions No recent signs or symptoms of congestive heart failure such as chest pain or shortness of breath  SUMMARY OF ONCOLOGIC HISTORY: Oncology History Overview Note  Vicki Cervantes  has a remote history of left  breast cancer at age 79 but received BRCA testing approximately 5 years ago which was negative. Her cancer was treated with surgery but no  radiation or chemotherapy.   Serous endometrial cancer, MSI stable ER positive, PR neg, Her2/neu 3+      Endometrial cancer ([place])  08/29/2016 Pathology Results   Endometrium, biopsy - HIGH GRADE ENDOMETRIAL CARCINOMA, SEE COMMENT. Microscopic Comment The sections show multiple fragments of adenocarcinoma displaying glandular and papillary patterns associated with high grade cytomorphology characterized by nuclear pleomorphism, prominent nucleoli and brisk mitosis. Immunohistochemical stains show that the tumor cells are positive for vimentin, p16, p53 with increased Ki-67 expression. Estrogen and progesterone receptor stains show patchy weak positivity. No significant positivity is seen with CEA. The findings are consistent with high grade endometrial carcinoma and the overall morphology and phenotypic features favor serous carcinoma.   08/29/2016 Initial Diagnosis   She presented with postmenopausal bleeding   10/03/2016 Imaging   CT C/A/P 09/2016 IMPRESSION: 1. Thickening of the endometrial canal up to 19 mm in fundus, presumably corresponding to the patient's reported endometrial carcinoma. 2. Multiple tiny pulmonary nodules scattered throughout the lungs bilaterally measuring 4 mm or less in size. Nodules of this size are typically considered statistically likely benign. In the setting of known primary malignancy, metastatic disease to the lungs is not excluded, but is not strongly favored on today's examination. Attention on followup studies is recommended to ensure the stability or resolution of these nodules. 3. Subcentimeter low-attenuation lesion in the central aspect of segment 8 of the liver is too small to characterize. This is statistically likely a tiny cyst, but warrants attention on follow-up studies to exclude the possibility of a solitary hepatic metastasis. 4. 1.5 x 1.5 x 1.7 cm well-circumscribed lesion in the proximal stomach. This is of uncertain  etiology and significance,  and could represent a benign gastric polyp, however, further evaluation with nonemergent endoscopy is suggested in the near future for further evaluation. 5. **An incidental finding of potential clinical significance has been found. 1.1 x 1.6 cm thyroid nodule in the inferior aspect of the right lobe of the thyroid gland. Follow-up evaluation with nonemergent thyroid ultrasound is recommended in the near future to better evaluate this finding. This recommendation follows ACR consensuss guidelines: Managing Incidental Thyroid Nodules Detected on Imaging: White Paper of the ACR Incidental Thyroid Findings Committee. J Am Coll Radiol 2015;12(2):143-150.** 6. Aortic atherosclerosis, in addition to left anterior descending coronary artery disease   10/22/2016 Surgery   Robotic assisted total hysterectomy, BSO and bilateral pelvic lymphadenectomy  Final pathology revealed a 3cm polyp containing serous carcinoma but with no myometrial invasion, no LVSI and negative nodes.  Stage IA Uterine serous cancer   10/22/2016 Pathology Results   1. Lymph nodes, regional resection, right pelvic - SIX BENIGN LYMPH NODES (0/6). 2. Lymph nodes, regional resection, left pelvic - SEVEN BENIGN LYMPH NODES (0/7). 3. Uterus +/- tubes/ovaries, neoplastic, with right ovary and fallopian tube ENDOMYOMETRIUM - SEROUS CARCINOMA ARISING WITHIN AN ENDOMETRIAL POLYP - NO MYOMETRIAL INVASION IDENTIFIED - ADENOMYOSIS - LEIOMYOMA (1 CM) - SEE ONCOLOGY TABLE AND COMMENT CERVIX - CARCINOMA FOCALLY INVOLVES ENDOCERVICAL GLANDS - NABOTHIAN CYSTS RIGHT ADNEXA - BENIGN OVARY AND FALLOPIAN TUBE - NO CARCINOMA IDENTIFIED 4. Cul-de-sac biopsy - MESOTHELIAL HYPERPLASIA Microscopic Comment 3. ONCOLOGY TABLE-UTERUS, CARCINOMA OR CARCINOSARCOMA Specimen: Uterus, right fallopian tube and ovary Procedure: Total hysterectomy and right salpingo-oophorectomy Lymph node sampling performed: Bilateral pelvic regional resection Specimen  integrity: Intact Maximum tumor size: 3 cm (polyp) Histologic type: Serous carcinoma Grade: High grade Myometrial invasion: Not identified Cervical stromal involvement: No, focal endocervical gland involvement Extent of involvement of other organs: Not identified Lymph - vascular invasion: Not identified Peritoneal washings: N/A Lymph nodes: Examined: 0 Sentinel 13 Non-sentinel 13 Total Lymph nodes with metastasis: 0 Isolated tumor cells (< 0.2 mm): 0 Micrometastasis: (> 0.2 mm and < 2.0 mm): 0 Macrometastasis: (> 2.0 mm): 0 Extracapsular extension: N/A Pelvic lymph nodes: 0 involved of 13 lymph nodes. Para-aortic lymph nodes: No para-aortic nodes submitted TNM code: pT1a, pNX FIGO Stage (based on pathologic findings, needs clinical correlation): IA Comment: Immunohistochemistry for cytokeratin AE1/AE3 is performed on all of the lymph nodes (parts 1 & 2) and no metastatic carcinoma is identified.   07/04/2017 Genetic Testing   The patient had genetic testing due to a personal history of breast and uterine cancer, and a family history of stomach cancer.  The Multi-Cancer Panel was ordered. The Multi-Cancer Panel offered by [facility] includes sequencing and/or deletion duplication testing of the following 83 genes: ALK, APC, ATM, AXIN2,BAP1,  BARD1, BLM, BMPR1A, BRCA1, BRCA2, BRIP1, CASR, CDC73, CDH1, CDK4, CDKN1B, CDKN1C, CDKN2A (p14ARF), CDKN2A (p16INK4a), CEBPA, CHEK2, CTNNA1, DICER1, DIS3L2, EGFR (c.2369C>T, p.Thr790Met variant only), EPCAM (Deletion/duplication testing only), FH, FLCN, GATA2, GPC3, GREM1 (Promoter region deletion/duplication testing only), HOXB13 (c.251G>A, p.Gly84Glu), HRAS, KIT, MAX, MEN1, MET, MITF (c.952G>A, p.Glu318Lys variant only), MLH1, MSH2, MSH3, MSH6, MUTYH, NBN, NF1, NF2, NTHL1, PALB2, PDGFRA, PHOX2B, PMS2, POLD1, POLE, POT1, PRKAR1A, PTCH1, PTEN, RAD50, RAD51C, RAD51D, RB1, RECQL4, RET, RUNX1, SDHAF2, SDHA (sequence changes only), SDHB, SDHC, SDHD, SMAD4,  SMARCA4, SMARCB1, SMARCE1, STK11, SUFU, TERC, TERT, TMEM127, TP53, TSC1, TSC2, VHL, WRN and WT1.   Results: No pathogenic variants were identified.  A variant of uncertain significance in the gene APC was identified.  c.791A>G (p.Gln264Arg).  The date of this test report is 07/04/2017.     Genetic Testing   Patient has genetic testing done for MSI. 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    12/03/2017 Imaging   CT Chest/Abd/Pelvis to follow pulmonary nodule and gastric mass IMPRESSION: 1. Stable CT of the chest. Small pulmonary nodules are unchanged when compared with previous exam. 2. No new findings identified. 3. Subcentimeter low-attenuation lesions within the liver are remain too small to characterize but are stable from prior exam. 4. Persistent indeterminate low-attenuation structure within the proximal stomach is unchanged measuring 1.4 cm. Correlation with direct visualization is advised   06/30/2018 Imaging   CT CHEST Lungs/Pleura: Stable scattered sub-cm pulmonary nodules are again seen bilaterally and are stable compared to previous studies. No new or enlarging pulmonary nodules or masses identified. No evidence of pulmonary infiltrate or pleural effusion.   08/19/2018 Echocardiogram   [place] is done in [place]: EF 60-65%. Mild impaired relaxation. (report scanned)   09/17/2018 Relapse/Recurrence   Presented with c/o vagina discharge.  Lesion noted at the left vaginal apex 76m lesion removed Path c/w high grade serous cancer   09/17/2018 Pathology Results   Vagina, biopsy, left apex - HIGH GRADE SEROUS CARCINOMA.   09/28/2018 PET scan   1. Two hypermetabolic axial lymph nodes. Unusual site for metastatic endometrial carcinoma however the activity is more intense than typically seen in reactive adenopathy. Suggest ultrasound-guided percutaneous biopsy of the larger RIGHT axial lymph node 2. No evidence of local recurrence at the vaginal cuff.  3.  No metastatic adenopathy in the abdomen or pelvis. 4. Stable small pulmonary nodules   10/09/2018 Pathology Results   Lymph node, needle/core biopsy, right axilla - METASTATIC CARCINOMA, SEE COMMENT. Microscopic Comment The carcinoma appears high grade. Immunohistochemistry is positive for cytokeratin 7, PAX-8, and ER. Cytokeratin 20, CDX-2, PR, and GATA-3 are negative. The findings along with the history are consistent with a gynecologic primary.    10/09/2018 Procedure   Ultrasound-guided core biopsies of a right axillary lymph node.   10/14/2018 Cancer Staging   Staging form: Corpus Uteri - Carcinoma and Carcinosarcoma, AJCC 8th Edition - Clinical: Stage IVB (cT1, cN0, pM1) - Signed by GHeath Lark MD on 10/14/2018   10/19/2018 Imaging   Placement of single lumen port a cath via right internal jugular vein. The catheter tip lies at the cavo-atrial junction. A power injectable port a cath was placed and is ready for immediate use.    10/21/2018 -  Chemotherapy   The patient had carboplatin and taxol for treatment   11/11/2018 -  Chemotherapy   The patient had trastuzumab ([name]) 450 mg in sodium chloride 0.9 % 250 mL chemo infusion, 483 mg, Intravenous,  Once, 6 of 6 cycles Administration: 450 mg (11/11/2018), 378 mg (12/02/2018), 378 mg (02/03/2019), 378 mg (12/23/2018), 378 mg (01/13/2019), 378 mg (02/24/2019) trastuzumab-dkst (OGIVRI) 378 mg in sodium chloride 0.9 % 250 mL chemo infusion, 6 mg/kg = 378 mg (100 % of original dose 6 mg/kg), Intravenous,  Once, 22 of 23 cycles Dose modification: 6 mg/kg (original dose 6 mg/kg, Cycle 7, Reason: Other (see comments)) Administration: 378 mg (03/24/2019), 378 mg (04/14/2019), 378 mg (05/05/2019), 378 mg (05/26/2019), 378 mg (06/16/2019), 378 mg (07/06/2019), 357 mg (07/28/2019), 357 mg (08/18/2019), 357 mg (09/07/2019), 357 mg (09/28/2019), 357 mg (10/19/2019), 357 mg (11/09/2019), 357 mg (11/30/2019), 357 mg (12/21/2019), 357 mg (01/20/2020), 357 mg (02/15/2020), 357 mg  (03/07/2020), 357 mg (03/28/2020), 357 mg (04/18/2020),  357 mg (05/09/2020), 357 mg (05/29/2020), 357 mg (06/20/2020)  for chemotherapy treatment.    12/21/2018 PET scan   1. Interval resolution of hypermetabolic right axillary lymph nodes. No metabolic findings highly suspicious for recurrent metastatic disease. 2. New mild hypermetabolism within a borderline prominent portacaval lymph node, nonspecific. While a reactive node is favored, a lymph node metastasis cannot be entirely excluded. Suggest attention to this lymph node on follow-up PET-CT in 3-6 months. 3. Nonspecific new hypermetabolism at the ileocecal valve, more likely physiologic given absence of CT correlate. 4. Scattered subcentimeter pulmonary nodules are all stable and below PET resolution, more likely benign, continued CT surveillance advised. 5. Chronic findings include: Aortic Atherosclerosis (ICD10-I70.0). Marked diffuse colonic diverticulosis. Coronary atherosclerosis.    12/28/2018 Echocardiogram   1. The left ventricle has normal systolic function with an ejection fraction of 60-65%. The cavity size was normal. Left ventricular diastolic Doppler parameters are consistent with impaired relaxation.  2. GLS recorded as -10.7 but LV appears hyperdynamic and tracking of endocardium appears poor.  3. The right ventricle has normal systolic function. The cavity was normal. There is no increase in right ventricular wall thickness.  4. Mild thickening of the mitral valve leaflet.  5. The aortic valve was not well visualized. Mild thickening of the aortic valve. Aortic valve regurgitation is trivial by color flow Doppler.   03/23/2019 Imaging   PET 1. No findings of hypermetabolic residual/recurrent or metastatic disease. 2. Similar low-level hypermetabolism within normal sized portocaval and right inguinal nodes, favored to be reactive. 3. Ongoing stability of small bilateral pulmonary nodules, favored to be benign. Below PET  resolution. 4. Coronary artery atherosclerosis. Aortic Atherosclerosis   03/26/2019 Echocardiogram   1. The left ventricle has normal systolic function, with an ejection fraction of 55-60%. The cavity size was normal. Left ventricular diastolic Doppler parameters are consistent with impaired relaxation.  2. The right ventricle has normal systolic function. The cavity was normal.  3. The mitral valve is abnormal. Mild thickening of the mitral valve leaflet. There is mild mitral annular calcification present.  4. The tricuspid valve is grossly normal.  5. The aortic valve is tricuspid. Mild calcification of the aortic valve. No stenosis of the aortic valve.  6. The aorta is normal unless otherwise noted.  7. Normal LV systolic function; grade 1 diastolic dysfunction; HDQ-22.2%.   06/30/2019 Imaging   Chest Impression:   1. No evidence thoracic metastasis. 2. Stable small benign-appearing pulmonary nodules   Abdomen / Pelvis Impression:   1. No evidence of metastatic disease in the abdomen pelvis. 2.  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 3.  Aortic Atherosclerosis (ICD10-I70.0).   06/30/2019 Echocardiogram    1. Left ventricular ejection fraction, by visual estimation, is 60 to 65%. The left ventricle has normal function. There is no left ventricular hypertrophy.  2. Left ventricular diastolic parameters are consistent with Grade I diastolic dysfunction (impaired relaxation).  3. The left ventricle has no regional wall motion abnormalities.  4. Global right ventricle has normal systolic function.The right ventricular size is normal. No increase in right ventricular wall thickness.  5. Left atrial size was mild-moderately dilated.  6. Right atrial size was normal.  7. The mitral valve is normal in structure. Trivial mitral valve regurgitation.  8. The tricuspid valve is normal in structure. Tricuspid valve regurgitation is trivial.  9. The aortic valve is normal in  structure. Aortic valve regurgitation is trivial. No evidence of aortic valve sclerosis or stenosis. 10. The  pulmonic valve was grossly normal. Pulmonic valve regurgitation is not visualized. 11. Mildly elevated pulmonary artery systolic pressure. 12. The inferior vena cava is normal in size with greater than 50% respiratory variability, suggesting right atrial pressure of 3 mmHg. 13. The average left ventricular global longitudinal strain is -12.5 %. GLS underestimated due to poor endocardial tracking.     09/27/2019 Imaging   1. Multiple small, nonspecific pulmonary nodules are again noted. The previously noted lung nodules are unchanged in size from previous exam. There is a new lung nodule within the medial right lower lobe which is nonspecific measure 4 mm. Attention at follow-up imaging is recommended. 2. No evidence of metastatic disease within the abdomen or pelvis. 3. Coronary artery calcifications.  The 4.  Aortic Atherosclerosis (ICD10-I70.0).   09/30/2019 Echocardiogram    1. Left ventricular ejection fraction, by estimation, is 60 to 65%. The left ventricle has normal function. The left ventricle has no regional wall motion abnormalities. Left ventricular diastolic parameters are consistent with Grade I diastolic dysfunction (impaired relaxation).  2. Right ventricular systolic function is normal. The right ventricular size is normal. There is normal pulmonary artery systolic pressure. The estimated right ventricular systolic pressure is 67.1 mmHg.  3. The mitral valve is normal in structure. No evidence of mitral valve regurgitation. No evidence of mitral stenosis.  4. The aortic valve is normal in structure. Aortic valve regurgitation is trivial. No aortic stenosis is present.  5. The inferior vena cava is normal in size with greater than 50% respiratory variability, suggesting right atrial pressure of 3 mmHg.     01/07/2020 Echocardiogram    1. Normal LV function; grade 1 diastolic  dysfunction; mild AI; GLS -21%.  2. Left ventricular ejection fraction, by estimation, is 55 to 60%. The left ventricle has normal function. The left ventricle has no regional wall motion abnormalities. Left ventricular diastolic parameters are consistent with Grade I diastolic dysfunction (impaired relaxation).  3. Right ventricular systolic function is normal. The right ventricular size is normal.  4. The mitral valve is normal in structure. Trivial mitral valve regurgitation. No evidence of mitral stenosis.  5. The aortic valve is tricuspid. Aortic valve regurgitation is mild. Mild aortic valve sclerosis is present, with no evidence of aortic valve stenosis.  6. The inferior vena cava is normal in size with greater than 50% respiratory variability, suggesting right atrial pressure of 3 mmHg.     03/27/2020 Imaging   1. Status post hysterectomy. No evidence of recurrent or metastatic disease in the abdomen or pelvis. 2. Multiple small pulmonary nodules in the lung bases are unchanged to the extent that they are included on current examination and remain most likely sequelae of prior infection or inflammation. Attention on follow-up. 3. Aortic Atherosclerosis (ICD10-I70.0).   04/17/2020 Echocardiogram   1. Normal LVEF, normal and unchanged GLS: -20.2%.  2. Left ventricular ejection fraction, by estimation, is 60 to 65%. The left ventricle has normal function. The left ventricle has no regional wall motion abnormalities. There is mild concentric left ventricular hypertrophy. Left ventricular diastolic parameters are consistent with Grade I diastolic dysfunction (impaired relaxation). The average left ventricular global longitudinal strain is -20.2 %. The global longitudinal strain is normal.  3. Right ventricular systolic function is normal. The right ventricular size is normal.  4. The mitral valve is normal in structure. Mild mitral valve regurgitation. No evidence of mitral stenosis.  5. The  aortic valve is normal in structure. There is mild calcification of the  aortic valve. There is mild thickening of the aortic valve. Aortic valve regurgitation is mild. No aortic stenosis is present.  6. The inferior vena cava is normal in size with greater than 50% respiratory variability, suggesting right atrial pressure of 3 mmHg.   Metastasis to lymph nodes ([place])  10/14/2018 Initial Diagnosis   Metastasis to lymph nodes ([place])   10/21/2018 - 02/24/2019 Chemotherapy   The patient had palonosetron (ALOXI) injection 0.25 mg, 0.25 mg, Intravenous,  Once, 7 of 7 cycles Administration: 0.25 mg (10/21/2018), 0.25 mg (11/11/2018), 0.25 mg (12/02/2018), 0.25 mg (12/23/2018), 0.25 mg (01/13/2019), 0.25 mg (02/03/2019), 0.25 mg (02/24/2019) CARBOplatin (PARAPLATIN) 310 mg in sodium chloride 0.9 % 250 mL chemo infusion, 310 mg, Intravenous,  Once, 7 of 7 cycles Dose modification: 300 mg (original dose 311.5 mg, Cycle 4, Reason: Dose not tolerated) Administration: 310 mg (10/21/2018), 300 mg (11/11/2018), 300 mg (12/02/2018), 300 mg (12/23/2018), 300 mg (01/13/2019), 300 mg (02/03/2019), 300 mg (02/24/2019) PACLitaxel (TAXOL) 222 mg in sodium chloride 0.9 % 250 mL chemo infusion (> 50m/m2), 135 mg/m2 = 222 mg, Intravenous,  Once, 7 of 7 cycles Administration: 222 mg (10/21/2018), 222 mg (11/11/2018), 222 mg (12/02/2018), 222 mg (12/23/2018), 222 mg (01/13/2019), 222 mg (02/03/2019), 222 mg (02/24/2019) fosaprepitant (EMEND) 150 mg, dexamethasone (DECADRON) 12 mg in sodium chloride 0.9 % 145 mL IVPB, , Intravenous,  Once, 7 of 7 cycles Administration:  (10/21/2018),  (11/11/2018),  (12/02/2018),  (12/23/2018),  (01/13/2019),  (02/03/2019),  (02/24/2019)  for chemotherapy treatment.      REVIEW OF SYSTEMS:   Constitutional: Denies fevers, chills or abnormal weight loss Eyes: Denies blurriness of vision Ears, nose, mouth, throat, and face: Denies mucositis or sore throat Respiratory: Denies cough, dyspnea or wheezes Cardiovascular: Denies  palpitation, chest discomfort or lower extremity swelling Gastrointestinal:  Denies nausea, heartburn or change in bowel habits Skin: Denies abnormal skin rashes Lymphatics: Denies new lymphadenopathy or easy bruising Neurological:Denies numbness, tingling or new weaknesses Behavioral/Psych: Mood is stable, no new changes  All other systems were reviewed with the patient and are negative.  I have reviewed the past medical history, past surgical history, social history and family history with the patient and they are unchanged from previous note.  ALLERGIES:  has No Known Allergies.  MEDICATIONS:  Current Outpatient Medications  Medication Sig Dispense Refill  . acetaminophen (TYLENOL) 500 MG tablet Take 1,000 mg by mouth every 6 (six) hours as needed.    .Marland Kitchenaspirin EC 81 MG tablet Take 81 mg by mouth daily.    .Marland Kitchenatorvastatin (LIPITOR) 20 MG tablet Take 20 mg by mouth every evening.     . Calcium Carbonate-Vitamin D (CALCIUM-VITAMIN D) 500-200 MG-UNIT per tablet Take 1 tablet by mouth daily.    . citalopram (CELEXA) 20 MG tablet Take 20 mg by mouth at bedtime.     .Marland Kitchenestradiol (ESTRACE VAGINAL) 0.1 MG/GM vaginal cream Place 1 Applicatorful vaginally 3 (three) times a week. 42.5 g 12  . gabapentin (NEURONTIN) 600 MG tablet Take 600 mg by mouth at bedtime as needed.    .Marland Kitchenlevothyroxine (SYNTHROID, LEVOTHROID) 100 MCG tablet Take 100 mcg by mouth daily before breakfast.     . lidocaine-prilocaine (EMLA) cream Apply to affected area once 30 g 3  . losartan-hydrochlorothiazide (HYZAAR) 100-25 MG tablet     . magnesium oxide (MAG-OX) 400 (241.3 Mg) MG tablet Take 1 tablet (400 mg total) by mouth daily. 30 tablet 11  . metoprolol succinate (TOPROL-XL) 25 MG 24 hr tablet  Take 25 mg by mouth daily.    . Multiple Vitamin (MULTIVITAMIN WITH MINERALS) TABS Take 1 tablet by mouth daily.    . ondansetron (ZOFRAN) 8 MG tablet Take 1 tablet (8 mg total) by mouth every 8 (eight) hours as needed. Start on the  third day after chemotherapy. 30 tablet 1  . pantoprazole (PROTONIX) 40 MG tablet Take 40 mg by mouth every morning.  (Patient not taking: Reported on 02/15/2020)    . valACYclovir (VALTREX) 1000 MG tablet valacyclovir 1 gram tablet     No current facility-administered medications for this visit.    PHYSICAL EXAMINATION: ECOG PERFORMANCE STATUS: 1 - Symptomatic but completely ambulatory  Vitals:   06/20/20 1117  BP: (!) 143/66  Pulse: 68  Resp: 18  Temp: 98 F (36.7 C)  SpO2: 99%   Filed Weights   06/20/20 1117  Weight: 134 lb 9.6 oz (61.1 kg)    GENERAL:alert, no distress and comfortable SKIN: skin color, texture, turgor are normal, no rashes or significant lesions EYES: normal, Conjunctiva are pink and non-injected, sclera clear OROPHARYNX:no exudate, no erythema and lips, buccal mucosa, and tongue normal  NECK: supple, thyroid normal size, non-tender, without nodularity LYMPH:  no palpable lymphadenopathy in the cervical, axillary or inguinal LUNGS: clear to auscultation and percussion with normal breathing effort HEART: regular rate & rhythm and no murmurs and no lower extremity edema ABDOMEN:abdomen soft, non-tender and normal bowel sounds Musculoskeletal:no cyanosis of digits and no clubbing  NEURO: alert & oriented x 3 with fluent speech, no focal motor/sensory deficits  LABORATORY DATA:  I have reviewed the data as listed    Component Value Date/Time   NA 139 06/20/2020 1035   NA 139 02/13/2017 1512   K 4.0 06/20/2020 1035   K 4.2 02/13/2017 1512   CL 103 06/20/2020 1035   CO2 26 06/20/2020 1035   CO2 28 02/13/2017 1512   GLUCOSE 99 06/20/2020 1035   GLUCOSE 118 02/13/2017 1512   BUN 22 06/20/2020 1035   BUN 25.2 02/13/2017 1512   CREATININE 1.47 (H) 06/20/2020 1035   CREATININE 1.37 (H) 12/21/2019 0850   CREATININE 1.2 (H) 02/13/2017 1512   CALCIUM 9.4 06/20/2020 1035   CALCIUM 10.0 02/13/2017 1512   PROT 7.0 06/20/2020 1035   ALBUMIN 3.8 06/20/2020  1035   AST 19 06/20/2020 1035   AST 19 12/21/2019 0850   ALT 15 06/20/2020 1035   ALT 13 12/21/2019 0850   ALKPHOS 72 06/20/2020 1035   BILITOT 0.4 06/20/2020 1035   BILITOT 0.4 12/21/2019 0850   GFRNONAA 36 (L) 06/20/2020 1035   GFRNONAA 37 (L) 12/21/2019 0850   GFRAA 47 (L) 03/28/2020 0933   GFRAA 42 (L) 12/21/2019 0850    No results found for: SPEP, UPEP  Lab Results  Component Value Date   WBC 6.4 06/20/2020   NEUTROABS 4.1 06/20/2020   HGB 11.1 (L) 06/20/2020   HCT 31.8 (L) 06/20/2020   MCV 91.6 06/20/2020   PLT 216 06/20/2020      Chemistry      Component Value Date/Time   NA 139 06/20/2020 1035   NA 139 02/13/2017 1512   K 4.0 06/20/2020 1035   K 4.2 02/13/2017 1512   CL 103 06/20/2020 1035   CO2 26 06/20/2020 1035   CO2 28 02/13/2017 1512   BUN 22 06/20/2020 1035   BUN 25.2 02/13/2017 1512   CREATININE 1.47 (H) 06/20/2020 1035   CREATININE 1.37 (H) 12/21/2019 0850   CREATININE 1.2 (  H) 02/13/2017 1512      Component Value Date/Time   CALCIUM 9.4 06/20/2020 1035   CALCIUM 10.0 02/13/2017 1512   ALKPHOS 72 06/20/2020 1035   AST 19 06/20/2020 1035   AST 19 12/21/2019 0850   ALT 15 06/20/2020 1035   ALT 13 12/21/2019 0850   BILITOT 0.4 06/20/2020 1035   BILITOT 0.4 12/21/2019 0850

## 2020-06-21 NOTE — Assessment & Plan Note (Signed)
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  Per recommendation, she will be closely followed at [facility]

## 2020-06-21 NOTE — Assessment & Plan Note (Signed)
She has history of hypomagnesemia She is not symptomatic She will get IV magnesium if she has coexisting hypokalemia

## 2020-06-21 NOTE — Assessment & Plan Note (Signed)
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 

## 2020-06-21 NOTE — Assessment & Plan Note (Signed)
Renal function is stable. Monitor closely She was seen by nephrologist recently 

## 2020-06-22 DIAGNOSIS — C50919 Malignant neoplasm of unspecified site of unspecified female breast: Secondary | ICD-10-CM | POA: Diagnosis not present

## 2020-06-22 DIAGNOSIS — K219 Gastro-esophageal reflux disease without esophagitis: Secondary | ICD-10-CM | POA: Diagnosis not present

## 2020-06-22 DIAGNOSIS — Z0000 Encounter for general adult medical examination without abnormal findings: Secondary | ICD-10-CM | POA: Diagnosis not present

## 2020-06-22 DIAGNOSIS — C541 Malignant neoplasm of endometrium: Secondary | ICD-10-CM | POA: Diagnosis not present

## 2020-06-22 DIAGNOSIS — M1990 Unspecified osteoarthritis, unspecified site: Secondary | ICD-10-CM | POA: Diagnosis not present

## 2020-06-22 DIAGNOSIS — E041 Nontoxic single thyroid nodule: Secondary | ICD-10-CM | POA: Diagnosis not present

## 2020-06-22 DIAGNOSIS — E039 Hypothyroidism, unspecified: Secondary | ICD-10-CM | POA: Diagnosis not present

## 2020-06-22 DIAGNOSIS — D849 Immunodeficiency, unspecified: Secondary | ICD-10-CM | POA: Diagnosis not present

## 2020-06-22 DIAGNOSIS — I10 Essential (primary) hypertension: Secondary | ICD-10-CM | POA: Diagnosis not present

## 2020-06-22 DIAGNOSIS — E785 Hyperlipidemia, unspecified: Secondary | ICD-10-CM | POA: Diagnosis not present

## 2020-06-22 DIAGNOSIS — M65332 Trigger finger, left middle finger: Secondary | ICD-10-CM | POA: Diagnosis not present

## 2020-06-23 DIAGNOSIS — Z1212 Encounter for screening for malignant neoplasm of rectum: Secondary | ICD-10-CM | POA: Diagnosis not present

## 2020-06-23 DIAGNOSIS — R82998 Other abnormal findings in urine: Secondary | ICD-10-CM | POA: Diagnosis not present

## 2020-06-23 DIAGNOSIS — I10 Essential (primary) hypertension: Secondary | ICD-10-CM | POA: Diagnosis not present

## 2020-07-18 ENCOUNTER — Inpatient Hospital Stay: Payer: Medicare HMO | Attending: Gynecologic Oncology

## 2020-07-18 ENCOUNTER — Inpatient Hospital Stay: Payer: Medicare HMO

## 2020-07-18 ENCOUNTER — Other Ambulatory Visit: Payer: Self-pay

## 2020-07-18 VITALS — BP 126/68 | HR 70 | Temp 97.8°F | Resp 18 | Wt 138.0 lb

## 2020-07-18 DIAGNOSIS — C541 Malignant neoplasm of endometrium: Secondary | ICD-10-CM

## 2020-07-18 DIAGNOSIS — N183 Chronic kidney disease, stage 3 unspecified: Secondary | ICD-10-CM | POA: Insufficient documentation

## 2020-07-18 DIAGNOSIS — Z79899 Other long term (current) drug therapy: Secondary | ICD-10-CM | POA: Diagnosis not present

## 2020-07-18 DIAGNOSIS — Z5112 Encounter for antineoplastic immunotherapy: Secondary | ICD-10-CM | POA: Diagnosis present

## 2020-07-18 DIAGNOSIS — C779 Secondary and unspecified malignant neoplasm of lymph node, unspecified: Secondary | ICD-10-CM | POA: Insufficient documentation

## 2020-07-18 DIAGNOSIS — Z7982 Long term (current) use of aspirin: Secondary | ICD-10-CM | POA: Insufficient documentation

## 2020-07-18 DIAGNOSIS — Z90710 Acquired absence of both cervix and uterus: Secondary | ICD-10-CM | POA: Insufficient documentation

## 2020-07-18 DIAGNOSIS — D631 Anemia in chronic kidney disease: Secondary | ICD-10-CM | POA: Insufficient documentation

## 2020-07-18 LAB — COMPREHENSIVE METABOLIC PANEL
ALT: 16 U/L (ref 0–44)
AST: 20 U/L (ref 15–41)
Albumin: 3.6 g/dL (ref 3.5–5.0)
Alkaline Phosphatase: 75 U/L (ref 38–126)
Anion gap: 10 (ref 5–15)
BUN: 31 mg/dL — ABNORMAL HIGH (ref 8–23)
CO2: 27 mmol/L (ref 22–32)
Calcium: 8.5 mg/dL — ABNORMAL LOW (ref 8.9–10.3)
Chloride: 104 mmol/L (ref 98–111)
Creatinine, Ser: 1.45 mg/dL — ABNORMAL HIGH (ref 0.44–1.00)
GFR, Estimated: 36 mL/min — ABNORMAL LOW (ref >60)
Glucose, Bld: 97 mg/dL (ref 70–99)
Potassium: 3.5 mmol/L (ref 3.5–5.1)
Sodium: 141 mmol/L (ref 135–145)
Total Bilirubin: 0.4 mg/dL (ref 0.3–1.2)
Total Protein: 6.6 g/dL (ref 6.5–8.1)

## 2020-07-18 LAB — CBC WITH DIFFERENTIAL/PLATELET
Abs Immature Granulocytes: 0.03 10*3/uL (ref 0.00–0.07)
Basophils Absolute: 0 10*3/uL (ref 0.0–0.1)
Basophils Relative: 1 %
Eosinophils Absolute: 0.2 10*3/uL (ref 0.0–0.5)
Eosinophils Relative: 4 %
HCT: 30 % — ABNORMAL LOW (ref 36.0–46.0)
Hemoglobin: 10.4 g/dL — ABNORMAL LOW (ref 12.0–15.0)
Immature Granulocytes: 1 %
Lymphocytes Relative: 32 %
Lymphs Abs: 1.6 10*3/uL (ref 0.7–4.0)
MCH: 31.3 pg (ref 26.0–34.0)
MCHC: 34.7 g/dL (ref 30.0–36.0)
MCV: 90.4 fL (ref 80.0–100.0)
Monocytes Absolute: 0.4 10*3/uL (ref 0.1–1.0)
Monocytes Relative: 8 %
Neutro Abs: 2.8 10*3/uL (ref 1.7–7.7)
Neutrophils Relative %: 54 %
Platelets: 180 10*3/uL (ref 150–400)
RBC: 3.32 MIL/uL — ABNORMAL LOW (ref 3.87–5.11)
RDW: 11.8 % (ref 11.5–15.5)
WBC: 5.1 10*3/uL (ref 4.0–10.5)
nRBC: 0 % (ref 0.0–0.2)

## 2020-07-18 LAB — MAGNESIUM: Magnesium: 0.7 mg/dL — CL (ref 1.7–2.4)

## 2020-07-18 MED ORDER — ACETAMINOPHEN 325 MG PO TABS
650.0000 mg | ORAL_TABLET | Freq: Once | ORAL | Status: AC
  Administered 2020-07-18: 650 mg via ORAL

## 2020-07-18 MED ORDER — SODIUM CHLORIDE 0.9% FLUSH
10.0000 mL | INTRAVENOUS | Status: DC | PRN
  Administered 2020-07-18: 10 mL
  Filled 2020-07-18: qty 10

## 2020-07-18 MED ORDER — DIPHENHYDRAMINE HCL 25 MG PO CAPS
ORAL_CAPSULE | ORAL | Status: AC
  Filled 2020-07-18: qty 1

## 2020-07-18 MED ORDER — DIPHENHYDRAMINE HCL 25 MG PO TABS
25.0000 mg | ORAL_TABLET | Freq: Once | ORAL | Status: AC
  Administered 2020-07-18: 25 mg via ORAL
  Filled 2020-07-18: qty 1

## 2020-07-18 MED ORDER — ACETAMINOPHEN 325 MG PO TABS
ORAL_TABLET | ORAL | Status: AC
  Filled 2020-07-18: qty 2

## 2020-07-18 MED ORDER — HEPARIN SOD (PORK) LOCK FLUSH 100 UNIT/ML IV SOLN
500.0000 [IU] | Freq: Once | INTRAVENOUS | Status: AC | PRN
  Administered 2020-07-18: 500 [IU]
  Filled 2020-07-18: qty 5

## 2020-07-18 MED ORDER — SODIUM CHLORIDE 0.9% FLUSH
10.0000 mL | Freq: Once | INTRAVENOUS | Status: AC
  Administered 2020-07-18: 10 mL
  Filled 2020-07-18: qty 10

## 2020-07-18 MED ORDER — TRASTUZUMAB-DKST CHEMO 150 MG IV SOLR
6.0000 mg/kg | Freq: Once | INTRAVENOUS | Status: AC
  Administered 2020-07-18: 357 mg via INTRAVENOUS
  Filled 2020-07-18: qty 17

## 2020-07-18 MED ORDER — SODIUM CHLORIDE 0.9 % IV SOLN
Freq: Once | INTRAVENOUS | Status: AC
  Filled 2020-07-18: qty 250

## 2020-07-18 NOTE — Patient Instructions (Signed)
[facility] Discharge Instructions for Patients Receiving Chemotherapy  Today you received the following chemotherapy agents trastuzumab.  To help prevent nausea and vomiting after your treatment, we encourage you to take your nausea medication as directed.    If you develop nausea and vomiting that is not controlled by your nausea medication, call the clinic.   BELOW ARE SYMPTOMS THAT SHOULD BE REPORTED IMMEDIATELY:  *FEVER GREATER THAN 100.5 F  *CHILLS WITH OR WITHOUT FEVER  NAUSEA AND VOMITING THAT IS NOT CONTROLLED WITH YOUR NAUSEA MEDICATION  *UNUSUAL SHORTNESS OF BREATH  *UNUSUAL BRUISING OR BLEEDING  TENDERNESS IN MOUTH AND THROAT WITH OR WITHOUT PRESENCE OF ULCERS  *URINARY PROBLEMS  *BOWEL PROBLEMS  UNUSUAL RASH Items with * indicate a potential emergency and should be followed up as soon as possible.  Feel free to call the clinic should you have any questions or concerns. The clinic phone number is [phone].  Please show the CHEMO ALERT CARD at check-in to the Emergency Department and triage nurse.   

## 2020-07-18 NOTE — Progress Notes (Signed)
Reported critical magnesium 0.7 to Dr. Bertis Ruddy.

## 2020-07-25 ENCOUNTER — Other Ambulatory Visit: Payer: Self-pay | Admitting: Hematology and Oncology

## 2020-07-25 ENCOUNTER — Telehealth: Payer: Self-pay

## 2020-07-25 DIAGNOSIS — C541 Malignant neoplasm of endometrium: Secondary | ICD-10-CM

## 2020-07-25 DIAGNOSIS — Z5111 Encounter for antineoplastic chemotherapy: Secondary | ICD-10-CM

## 2020-07-25 NOTE — Telephone Encounter (Signed)
I sent new scheduling msg Please schedule echo next week

## 2020-07-25 NOTE — Telephone Encounter (Signed)
She called and left a message. She is checking on appt for infusion. She is supposed to have infusion on 1/24. She thought she was to have a scan and ekg/echo?

## 2020-07-25 NOTE — Telephone Encounter (Signed)
Called and given appt for echo for 1/19 at 10 am, arrive a 0945 to [facility]. Given message from Dr. Alvy Bimler that scheduler will call her. She verbalized understanding.

## 2020-07-27 ENCOUNTER — Telehealth: Payer: Self-pay | Admitting: Hematology and Oncology

## 2020-07-27 NOTE — Telephone Encounter (Signed)
Scheduled appt per 1/11 sch msg - left message for pt with appt date and time

## 2020-08-02 ENCOUNTER — Ambulatory Visit (HOSPITAL_COMMUNITY)
Admission: RE | Admit: 2020-08-02 | Discharge: 2020-08-02 | Disposition: A | Discharge status: Home / Self Care | Payer: Medicare HMO | Source: Ambulatory Visit | Attending: Hematology and Oncology | Admitting: Hematology and Oncology

## 2020-08-02 ENCOUNTER — Other Ambulatory Visit: Payer: Self-pay

## 2020-08-02 DIAGNOSIS — Z0189 Encounter for other specified special examinations: Secondary | ICD-10-CM

## 2020-08-02 DIAGNOSIS — Z5111 Encounter for antineoplastic chemotherapy: Secondary | ICD-10-CM | POA: Diagnosis not present

## 2020-08-02 DIAGNOSIS — I10 Essential (primary) hypertension: Secondary | ICD-10-CM | POA: Insufficient documentation

## 2020-08-02 DIAGNOSIS — C541 Malignant neoplasm of endometrium: Secondary | ICD-10-CM | POA: Insufficient documentation

## 2020-08-02 DIAGNOSIS — I351 Nonrheumatic aortic (valve) insufficiency: Secondary | ICD-10-CM | POA: Insufficient documentation

## 2020-08-02 LAB — ECHOCARDIOGRAM COMPLETE
Area-P 1/2: 2.66 cm2
P 1/2 time: 1124 msec
S' Lateral: 3.1 cm

## 2020-08-02 NOTE — Progress Notes (Signed)
  Echocardiogram 2D Echocardiogram has been performed.  JASDEEP DEJARNETT 08/02/2020, 11:27 AM

## 2020-08-03 ENCOUNTER — Telehealth: Payer: Self-pay

## 2020-08-03 NOTE — Telephone Encounter (Signed)
-----   Message from [name], MD sent at 08/03/2020  8:34 AM EST ----- Regarding: pls let her know echo is ok

## 2020-08-03 NOTE — Telephone Encounter (Signed)
Called and given below message. She verbalized understanding. 

## 2020-08-08 ENCOUNTER — Other Ambulatory Visit: Payer: Self-pay

## 2020-08-08 ENCOUNTER — Inpatient Hospital Stay: Payer: Medicare HMO | Admitting: Hematology and Oncology

## 2020-08-08 ENCOUNTER — Inpatient Hospital Stay: Payer: Medicare HMO

## 2020-08-08 ENCOUNTER — Encounter: Payer: Self-pay | Admitting: Hematology and Oncology

## 2020-08-08 ENCOUNTER — Other Ambulatory Visit: Payer: Self-pay | Admitting: Hematology and Oncology

## 2020-08-08 VITALS — BP 142/75 | HR 75 | Temp 97.4°F | Resp 18 | Ht 62.2 in | Wt 134.4 lb

## 2020-08-08 DIAGNOSIS — C773 Secondary and unspecified malignant neoplasm of axilla and upper limb lymph nodes: Secondary | ICD-10-CM | POA: Diagnosis not present

## 2020-08-08 DIAGNOSIS — Z79899 Other long term (current) drug therapy: Secondary | ICD-10-CM | POA: Diagnosis not present

## 2020-08-08 DIAGNOSIS — D631 Anemia in chronic kidney disease: Secondary | ICD-10-CM | POA: Diagnosis not present

## 2020-08-08 DIAGNOSIS — Z7982 Long term (current) use of aspirin: Secondary | ICD-10-CM | POA: Diagnosis not present

## 2020-08-08 DIAGNOSIS — C541 Malignant neoplasm of endometrium: Secondary | ICD-10-CM

## 2020-08-08 DIAGNOSIS — C779 Secondary and unspecified malignant neoplasm of lymph node, unspecified: Secondary | ICD-10-CM | POA: Diagnosis not present

## 2020-08-08 DIAGNOSIS — N183 Chronic kidney disease, stage 3 unspecified: Secondary | ICD-10-CM

## 2020-08-08 DIAGNOSIS — Z5112 Encounter for antineoplastic immunotherapy: Secondary | ICD-10-CM | POA: Diagnosis not present

## 2020-08-08 DIAGNOSIS — D630 Anemia in neoplastic disease: Secondary | ICD-10-CM

## 2020-08-08 DIAGNOSIS — Z90710 Acquired absence of both cervix and uterus: Secondary | ICD-10-CM | POA: Diagnosis not present

## 2020-08-08 LAB — CBC WITH DIFFERENTIAL/PLATELET
Abs Immature Granulocytes: 0.04 10*3/uL (ref 0.00–0.07)
Basophils Absolute: 0.1 10*3/uL (ref 0.0–0.1)
Basophils Relative: 1 %
Eosinophils Absolute: 0.3 10*3/uL (ref 0.0–0.5)
Eosinophils Relative: 4 %
HCT: 33.1 % — ABNORMAL LOW (ref 36.0–46.0)
Hemoglobin: 11.5 g/dL — ABNORMAL LOW (ref 12.0–15.0)
Immature Granulocytes: 1 %
Lymphocytes Relative: 25 %
Lymphs Abs: 1.7 10*3/uL (ref 0.7–4.0)
MCH: 31.5 pg (ref 26.0–34.0)
MCHC: 34.7 g/dL (ref 30.0–36.0)
MCV: 90.7 fL (ref 80.0–100.0)
Monocytes Absolute: 0.5 10*3/uL (ref 0.1–1.0)
Monocytes Relative: 8 %
Neutro Abs: 4.3 10*3/uL (ref 1.7–7.7)
Neutrophils Relative %: 61 %
Platelets: 209 10*3/uL (ref 150–400)
RBC: 3.65 MIL/uL — ABNORMAL LOW (ref 3.87–5.11)
RDW: 11.9 % (ref 11.5–15.5)
WBC: 6.8 10*3/uL (ref 4.0–10.5)
nRBC: 0 % (ref 0.0–0.2)

## 2020-08-08 LAB — COMPREHENSIVE METABOLIC PANEL
ALT: 14 U/L (ref 0–44)
AST: 21 U/L (ref 15–41)
Albumin: 4 g/dL (ref 3.5–5.0)
Alkaline Phosphatase: 72 U/L (ref 38–126)
Anion gap: 13 (ref 5–15)
BUN: 31 mg/dL — ABNORMAL HIGH (ref 8–23)
CO2: 27 mmol/L (ref 22–32)
Calcium: 9.2 mg/dL (ref 8.9–10.3)
Chloride: 100 mmol/L (ref 98–111)
Creatinine, Ser: 1.58 mg/dL — ABNORMAL HIGH (ref 0.44–1.00)
GFR, Estimated: 33 mL/min — ABNORMAL LOW (ref >60)
Glucose, Bld: 104 mg/dL — ABNORMAL HIGH (ref 70–99)
Potassium: 3.8 mmol/L (ref 3.5–5.1)
Sodium: 140 mmol/L (ref 135–145)
Total Bilirubin: 0.5 mg/dL (ref 0.3–1.2)
Total Protein: 7.1 g/dL (ref 6.5–8.1)

## 2020-08-08 MED ORDER — ACETAMINOPHEN 325 MG PO TABS
650.0000 mg | ORAL_TABLET | Freq: Once | ORAL | Status: AC
  Administered 2020-08-08: 650 mg via ORAL

## 2020-08-08 MED ORDER — ALTEPLASE 2 MG IJ SOLR
INTRAMUSCULAR | Status: AC
  Filled 2020-08-08: qty 2

## 2020-08-08 MED ORDER — DIPHENHYDRAMINE HCL 25 MG PO CAPS
ORAL_CAPSULE | ORAL | Status: AC
  Filled 2020-08-08: qty 1

## 2020-08-08 MED ORDER — SODIUM CHLORIDE 0.9% FLUSH
10.0000 mL | Freq: Once | INTRAVENOUS | Status: AC
  Administered 2020-08-08: 10 mL
  Filled 2020-08-08: qty 10

## 2020-08-08 MED ORDER — ACETAMINOPHEN 325 MG PO TABS
ORAL_TABLET | ORAL | Status: AC
  Filled 2020-08-08: qty 2

## 2020-08-08 MED ORDER — SODIUM CHLORIDE 0.9 % IV SOLN
Freq: Once | INTRAVENOUS | Status: AC
  Filled 2020-08-08: qty 250

## 2020-08-08 MED ORDER — HEPARIN SOD (PORK) LOCK FLUSH 100 UNIT/ML IV SOLN
500.0000 [IU] | Freq: Once | INTRAVENOUS | Status: AC | PRN
  Administered 2020-08-08: 500 [IU]
  Filled 2020-08-08: qty 5

## 2020-08-08 MED ORDER — TRASTUZUMAB-DKST CHEMO 150 MG IV SOLR
6.0000 mg/kg | Freq: Once | INTRAVENOUS | Status: AC
  Administered 2020-08-08: 357 mg via INTRAVENOUS
  Filled 2020-08-08: qty 17

## 2020-08-08 MED ORDER — ALTEPLASE 2 MG IJ SOLR
2.0000 mg | Freq: Once | INTRAMUSCULAR | Status: AC
  Administered 2020-08-08: 2 mg
  Filled 2020-08-08: qty 2

## 2020-08-08 MED ORDER — SODIUM CHLORIDE 0.9% FLUSH
10.0000 mL | INTRAVENOUS | Status: DC | PRN
  Administered 2020-08-08: 10 mL
  Filled 2020-08-08: qty 10

## 2020-08-08 MED ORDER — DIPHENHYDRAMINE HCL 25 MG PO CAPS
25.0000 mg | ORAL_CAPSULE | Freq: Once | ORAL | Status: AC
  Administered 2020-08-08: 25 mg via ORAL

## 2020-08-08 NOTE — Progress Notes (Signed)
Unable to get blood return from port. cathflo administered by Delle Reining RN @1230 . Patient sent back to lab to get blood from arm

## 2020-08-08 NOTE — Assessment & Plan Note (Signed)
Her last CT imaging showed excellent response to therapy I recommend CT imaging every 6 months, next due around March 2022 Repeat recent echocardiogram is within normal limits The plan would be to continue [name] indefinitely She saw GYN surgeon recently.  Per recommendation, she will be closely followed at [facility]

## 2020-08-08 NOTE — Assessment & Plan Note (Signed)
Renal function is stable. Monitor closely She was seen by nephrologist recently 

## 2020-08-08 NOTE — Progress Notes (Signed)
[place] OFFICE PROGRESS NOTE  Patient Care Team: Burnard Bunting, [name]  ASSESSMENT & PLAN:  Endometrial cancer [facility]) Her last CT imaging showed excellent response to therapy I recommend CT imaging every 6 months, next due around March 2022 Repeat recent echocardiogram is within normal limits The plan would be to continue [name] indefinitely She saw GYN surgeon recently.  Per recommendation, she will be closely followed at [facility]  CKD (chronic kidney disease), stage III ([place]) Renal function is stable. Monitor closely She was seen by nephrologist recently  Anemia in neoplastic disease She has multifactorial anemia, anemia secondary to prior chemotherapy as well as chronic kidney disease Anemia is improving/stable Recent vitamin B12 level was adequate She is not symptomatic Observe only   Orders Placed This Encounter  Procedures  . CT CHEST ABDOMEN PELVIS W CONTRAST    Standing Status:   Future    Standing Expiration Date:   08/08/2021    Order Specific Question:   Preferred imaging location?    Answer:   [facility]    Order Specific Question:   Radiology Contrast Protocol - do NOT remove file path    Answer:   [email or web address]    All questions were answered. The patient knows to call the clinic with any problems, questions or concerns. The total time spent in the appointment was 20 minutes encounter with patients including review of chart and various tests results, discussions about plan of care and coordination of care plan   [name], MD 08/08/2020 2:48 PM  INTERVAL HISTORY: Please see below for problem oriented charting. 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 Her cancer was treated with surgery but no radiation or chemotherapy.   Serous endometrial cancer, MSI stable ER positive, PR neg, Her2/neu 3+      Endometrial cancer ([place])  08/29/2016 Pathology Results   Endometrium, biopsy - HIGH GRADE ENDOMETRIAL CARCINOMA, SEE COMMENT. Microscopic Comment The sections show multiple fragments of adenocarcinoma displaying glandular and papillary patterns associated with high grade cytomorphology characterized by nuclear pleomorphism, prominent nucleoli and brisk mitosis. Immunohistochemical stains show that the tumor cells are positive for vimentin, p16, p53 with increased Ki-67 expression. Estrogen and progesterone receptor stains show patchy weak positivity. No significant positivity is seen with CEA. The findings are consistent with high grade endometrial carcinoma and the overall morphology and phenotypic features favor serous carcinoma.   08/29/2016 Initial Diagnosis   She presented with postmenopausal bleeding   10/03/2016 Imaging   CT C/A/P 09/2016 IMPRESSION: 1. Thickening of the endometrial canal up to 19 mm in fundus, presumably corresponding to the patient's reported endometrial carcinoma. 2. Multiple tiny pulmonary nodules scattered throughout the lungs bilaterally measuring 4 mm or less in size. Nodules of this size are typically considered statistically likely benign. In the setting of known primary malignancy, metastatic disease to the lungs is not excluded, but is not strongly favored on today's examination. Attention on followup studies is recommended to ensure the stability or resolution of these nodules. 3. Subcentimeter low-attenuation lesion in the central aspect of segment 8 of the liver is too small to characterize. This  is statistically likely a tiny cyst, but warrants attention on follow-up studies to  exclude the possibility of a solitary hepatic metastasis. 4. 1.5 x 1.5 x 1.7 cm well-circumscribed lesion in the proximal stomach. This is of uncertain etiology and significance, and could represent a benign gastric polyp, however, further evaluation with nonemergent endoscopy is suggested in the near future for further evaluation. 5. **An incidental finding of potential clinical significance has been found. 1.1 x 1.6 cm thyroid nodule in the inferior aspect of the right lobe of the thyroid gland. Follow-up evaluation with nonemergent thyroid ultrasound is recommended in the near future to better evaluate this finding. This recommendation follows ACR consensuss guidelines: Managing Incidental Thyroid Nodules Detected on Imaging: White Paper of the ACR Incidental Thyroid Findings Committee. J Am Coll Radiol 2015;12(2):143-150.** 6. Aortic atherosclerosis, in addition to left anterior descending coronary artery disease   10/22/2016 Surgery   Robotic assisted total hysterectomy, BSO and bilateral pelvic lymphadenectomy  Final pathology revealed a 3cm polyp containing serous carcinoma but with no myometrial invasion, no LVSI and negative nodes.  Stage IA Uterine serous cancer   10/22/2016 Pathology Results   1. Lymph nodes, regional resection, right pelvic - SIX BENIGN LYMPH NODES (0/6). 2. Lymph nodes, regional resection, left pelvic - SEVEN BENIGN LYMPH NODES (0/7). 3. Uterus +/- tubes/ovaries, neoplastic, with right ovary and fallopian tube ENDOMYOMETRIUM - SEROUS CARCINOMA ARISING WITHIN AN ENDOMETRIAL POLYP - NO MYOMETRIAL INVASION IDENTIFIED - ADENOMYOSIS - LEIOMYOMA (1 CM) - SEE ONCOLOGY TABLE AND COMMENT CERVIX - CARCINOMA FOCALLY INVOLVES ENDOCERVICAL GLANDS - NABOTHIAN CYSTS RIGHT ADNEXA - BENIGN OVARY AND FALLOPIAN TUBE - NO CARCINOMA IDENTIFIED 4. Cul-de-sac biopsy - MESOTHELIAL HYPERPLASIA Microscopic Comment 3. ONCOLOGY TABLE-UTERUS, CARCINOMA OR CARCINOSARCOMA Specimen:  Uterus, right fallopian tube and ovary Procedure: Total hysterectomy and right salpingo-oophorectomy Lymph node sampling performed: Bilateral pelvic regional resection Specimen integrity: Intact Maximum tumor size: 3 cm (polyp) Histologic type: Serous carcinoma Grade: High grade Myometrial invasion: Not identified Cervical stromal involvement: No, focal endocervical gland involvement Extent of involvement of other organs: Not identified Lymph - vascular invasion: Not identified Peritoneal washings: N/A Lymph nodes: Examined: 0 Sentinel 13 Non-sentinel 13 Total Lymph nodes with metastasis: 0 Isolated tumor cells (< 0.2 mm): 0 Micrometastasis: (> 0.2 mm and < 2.0 mm): 0 Macrometastasis: (> 2.0 mm): 0 Extracapsular extension: N/A Pelvic lymph nodes: 0 involved of 13 lymph nodes. Para-aortic lymph nodes: No para-aortic nodes submitted TNM code: pT1a, pNX FIGO Stage (based on pathologic findings, needs clinical correlation): IA Comment: Immunohistochemistry for cytokeratin AE1/AE3 is performed on all of the lymph nodes (parts 1 & 2) and no metastatic carcinoma is identified.   07/04/2017 Genetic Testing   The patient had genetic testing due to a personal history of breast and uterine cancer, and a family history of stomach cancer.  The Multi-Cancer Panel was ordered. The Multi-Cancer Panel offered by [facility] includes sequencing and/or deletion duplication testing of the following 83 genes: ALK, APC, ATM, AXIN2,BAP1,  BARD1, BLM, BMPR1A, BRCA1, BRCA2, BRIP1, CASR, CDC73, CDH1, CDK4, CDKN1B, CDKN1C, CDKN2A (p14ARF), CDKN2A (p16INK4a), CEBPA, CHEK2, CTNNA1, DICER1, DIS3L2, EGFR (c.2369C>T, p.Thr790Met variant only), EPCAM (Deletion/duplication testing only), FH, FLCN, GATA2, GPC3, GREM1 (Promoter region deletion/duplication testing only), HOXB13 (c.251G>A, p.Gly84Glu), HRAS, KIT, MAX, MEN1, MET, MITF (c.952G>A, p.Glu318Lys variant only), MLH1, MSH2, MSH3, MSH6, MUTYH, NBN, NF1, NF2, NTHL1, PALB2,  PDGFRA, PHOX2B, PMS2, POLD1, POLE, POT1, PRKAR1A, PTCH1, PTEN, RAD50, RAD51C, RAD51D, RB1, RECQL4, RET, RUNX1, SDHAF2, SDHA (sequence changes only), SDHB, SDHC, SDHD, SMAD4,  SMARCA4, SMARCB1, SMARCE1, STK11, SUFU, TERC, TERT, TMEM127, TP53, TSC1, TSC2, VHL, WRN and WT1.   Results: No pathogenic variants were identified.  A variant of uncertain significance in the gene APC was identified.  c.791A>G (p.Gln264Arg).  The date of this test report is 07/04/2017.     Genetic Testing   Patient has genetic testing done for MSI. 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    12/03/2017 Imaging   CT Chest/Abd/Pelvis to follow pulmonary nodule and gastric mass IMPRESSION: 1. Stable CT of the chest. Small pulmonary nodules are unchanged when compared with previous exam. 2. No new findings identified. 3. Subcentimeter low-attenuation lesions within the liver are remain too small to characterize but are stable from prior exam. 4. Persistent indeterminate low-attenuation structure within the proximal stomach is unchanged measuring 1.4 cm. Correlation with direct visualization is advised   06/30/2018 Imaging   CT CHEST Lungs/Pleura: Stable scattered sub-cm pulmonary nodules are again seen bilaterally and are stable compared to previous studies. No new or enlarging pulmonary nodules or masses identified. No evidence of pulmonary infiltrate or pleural effusion.   08/19/2018 Echocardiogram   [place] is done in [place]: EF 60-65%. Mild impaired relaxation. (report scanned)   09/17/2018 Relapse/Recurrence   Presented with c/o vagina discharge.  Lesion noted at the left vaginal apex 94m lesion removed Path c/w high grade serous cancer   09/17/2018 Pathology Results   Vagina, biopsy, left apex - HIGH GRADE SEROUS CARCINOMA.   09/28/2018 PET scan   1. Two hypermetabolic axial lymph nodes. Unusual site for metastatic endometrial carcinoma however the activity is more intense than typically  seen in reactive adenopathy. Suggest ultrasound-guided percutaneous biopsy of the larger RIGHT axial lymph node 2. No evidence of local recurrence at the vaginal cuff.  3. No metastatic adenopathy in the abdomen or pelvis. 4. Stable small pulmonary nodules   10/09/2018 Pathology Results   Lymph node, needle/core biopsy, right axilla - METASTATIC CARCINOMA, SEE COMMENT. Microscopic Comment The carcinoma appears high grade. Immunohistochemistry is positive for cytokeratin 7, PAX-8, and ER. Cytokeratin 20, CDX-2, PR, and GATA-3 are negative. The findings along with the history are consistent with a gynecologic primary.    10/09/2018 Procedure   Ultrasound-guided core biopsies of a right axillary lymph node.   10/14/2018 Cancer Staging   Staging form: Corpus Uteri - Carcinoma and Carcinosarcoma, AJCC 8th Edition - Clinical: Stage IVB (cT1, cN0, pM1) - Signed by GHeath Lark MD on 10/14/2018   10/19/2018 Imaging   Placement of single lumen port a cath via right internal jugular vein. The catheter tip lies at the cavo-atrial junction. A power injectable port a cath was placed and is ready for immediate use.    10/21/2018 -  Chemotherapy   The patient had carboplatin and taxol for treatment   11/11/2018 -  Chemotherapy      Patient is on Antibody Plan: BREAST OGIVRI Q21D    12/21/2018 PET scan   1. Interval resolution of hypermetabolic right axillary lymph nodes. No metabolic findings highly suspicious for recurrent metastatic disease. 2. New mild hypermetabolism within a borderline prominent portacaval lymph node, nonspecific. While a reactive node is favored, a lymph node metastasis cannot be entirely excluded. Suggest attention to this lymph node on follow-up PET-CT in 3-6 months. 3. Nonspecific new hypermetabolism at the ileocecal valve, more likely physiologic given absence of CT correlate. 4. Scattered subcentimeter pulmonary nodules are all stable and below PET resolution, more likely benign,  continued CT surveillance  advised. 5. Chronic findings include: Aortic Atherosclerosis (ICD10-I70.0). Marked diffuse colonic diverticulosis. Coronary atherosclerosis.    12/28/2018 Echocardiogram   1. The left ventricle has normal systolic function with an ejection fraction of 60-65%. The cavity size was normal. Left ventricular diastolic Doppler parameters are consistent with impaired relaxation.  2. GLS recorded as -10.7 but LV appears hyperdynamic and tracking of endocardium appears poor.  3. The right ventricle has normal systolic function. The cavity was normal. There is no increase in right ventricular wall thickness.  4. Mild thickening of the mitral valve leaflet.  5. The aortic valve was not well visualized. Mild thickening of the aortic valve. Aortic valve regurgitation is trivial by color flow Doppler.   03/23/2019 Imaging   PET 1. No findings of hypermetabolic residual/recurrent or metastatic disease. 2. Similar low-level hypermetabolism within normal sized portocaval and right inguinal nodes, favored to be reactive. 3. Ongoing stability of small bilateral pulmonary nodules, favored to be benign. Below PET resolution. 4. Coronary artery atherosclerosis. Aortic Atherosclerosis   03/26/2019 Echocardiogram   1. The left ventricle has normal systolic function, with an ejection fraction of 55-60%. The cavity size was normal. Left ventricular diastolic Doppler parameters are consistent with impaired relaxation.  2. The right ventricle has normal systolic function. The cavity was normal.  3. The mitral valve is abnormal. Mild thickening of the mitral valve leaflet. There is mild mitral annular calcification present.  4. The tricuspid valve is grossly normal.  5. The aortic valve is tricuspid. Mild calcification of the aortic valve. No stenosis of the aortic valve.  6. The aorta is normal unless otherwise noted.  7. Normal LV systolic function; grade 1 diastolic dysfunction;  ZSM-27.0%.   06/30/2019 Imaging   Chest Impression:   1. No evidence thoracic metastasis. 2. Stable small benign-appearing pulmonary nodules   Abdomen / Pelvis Impression:   1. No evidence of metastatic disease in the abdomen pelvis. 2.  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 3.  Aortic Atherosclerosis (ICD10-I70.0).   06/30/2019 Echocardiogram    1. Left ventricular ejection fraction, by visual estimation, is 60 to 65%. The left ventricle has normal function. There is no left ventricular hypertrophy.  2. Left ventricular diastolic parameters are consistent with Grade I diastolic dysfunction (impaired relaxation).  3. The left ventricle has no regional wall motion abnormalities.  4. Global right ventricle has normal systolic function.The right ventricular size is normal. No increase in right ventricular wall thickness.  5. Left atrial size was mild-moderately dilated.  6. Right atrial size was normal.  7. The mitral valve is normal in structure. Trivial mitral valve regurgitation.  8. The tricuspid valve is normal in structure. Tricuspid valve regurgitation is trivial.  9. The aortic valve is normal in structure. Aortic valve regurgitation is trivial. No evidence of aortic valve sclerosis or stenosis. 10. The pulmonic valve was grossly normal. Pulmonic valve regurgitation is not visualized. 11. Mildly elevated pulmonary artery systolic pressure. 12. The inferior vena cava is normal in size with greater than 50% respiratory variability, suggesting right atrial pressure of 3 mmHg. 13. The average left ventricular global longitudinal strain is -12.5 %. GLS underestimated due to poor endocardial tracking.     09/27/2019 Imaging   1. Multiple small, nonspecific pulmonary nodules are again noted. The previously noted lung nodules are unchanged in size from previous exam. There is a new lung nodule within the medial right lower lobe which is nonspecific measure 4 mm. Attention  at follow-up imaging is recommended.  2. No evidence of metastatic disease within the abdomen or pelvis. 3. Coronary artery calcifications.  The 4.  Aortic Atherosclerosis (ICD10-I70.0).   09/30/2019 Echocardiogram    1. Left ventricular ejection fraction, by estimation, is 60 to 65%. The left ventricle has normal function. The left ventricle has no regional wall motion abnormalities. Left ventricular diastolic parameters are consistent with Grade I diastolic dysfunction (impaired relaxation).  2. Right ventricular systolic function is normal. The right ventricular size is normal. There is normal pulmonary artery systolic pressure. The estimated right ventricular systolic pressure is 46.2 mmHg.  3. The mitral valve is normal in structure. No evidence of mitral valve regurgitation. No evidence of mitral stenosis.  4. The aortic valve is normal in structure. Aortic valve regurgitation is trivial. No aortic stenosis is present.  5. The inferior vena cava is normal in size with greater than 50% respiratory variability, suggesting right atrial pressure of 3 mmHg.     01/07/2020 Echocardiogram    1. Normal LV function; grade 1 diastolic dysfunction; mild AI; GLS -21%.  2. Left ventricular ejection fraction, by estimation, is 55 to 60%. The left ventricle has normal function. The left ventricle has no regional wall motion abnormalities. Left ventricular diastolic parameters are consistent with Grade I diastolic dysfunction (impaired relaxation).  3. Right ventricular systolic function is normal. The right ventricular size is normal.  4. The mitral valve is normal in structure. Trivial mitral valve regurgitation. No evidence of mitral stenosis.  5. The aortic valve is tricuspid. Aortic valve regurgitation is mild. Mild aortic valve sclerosis is present, with no evidence of aortic valve stenosis.  6. The inferior vena cava is normal in size with greater than 50% respiratory variability, suggesting right  atrial pressure of 3 mmHg.     03/27/2020 Imaging   1. Status post hysterectomy. No evidence of recurrent or metastatic disease in the abdomen or pelvis. 2. Multiple small pulmonary nodules in the lung bases are unchanged to the extent that they are included on current examination and remain most likely sequelae of prior infection or inflammation. Attention on follow-up. 3. Aortic Atherosclerosis (ICD10-I70.0).   04/17/2020 Echocardiogram   1. Normal LVEF, normal and unchanged GLS: -20.2%.  2. Left ventricular ejection fraction, by estimation, is 60 to 65%. The left ventricle has normal function. The left ventricle has no regional wall motion abnormalities. There is mild concentric left ventricular hypertrophy. Left ventricular diastolic parameters are consistent with Grade I diastolic dysfunction (impaired relaxation). The average left ventricular global longitudinal strain is -20.2 %. The global longitudinal strain is normal.  3. Right ventricular systolic function is normal. The right ventricular size is normal.  4. The mitral valve is normal in structure. Mild mitral valve regurgitation. No evidence of mitral stenosis.  5. The aortic valve is normal in structure. There is mild calcification of the aortic valve. There is mild thickening of the aortic valve. Aortic valve regurgitation is mild. No aortic stenosis is present.  6. The inferior vena cava is normal in size with greater than 50% respiratory variability, suggesting right atrial pressure of 3 mmHg.   08/02/2020 Echocardiogram   1. Left ventricular ejection fraction, by estimation, is 55 to 60%. The left ventricle has normal function. The left ventricle has no regional wall motion abnormalities. Left ventricular diastolic parameters are consistent with Grade I diastolic dysfunction (impaired relaxation). The average left ventricular global longitudinal strain is -27.0 %. The global longitudinal strain is normal.  2. Right ventricular  systolic function is  normal. The right ventricular size is normal. Tricuspid regurgitation signal is inadequate for assessing PA pressure.  3. The mitral valve is grossly normal. Trivial mitral valve regurgitation. No evidence of mitral stenosis.  4. The aortic valve is tricuspid. Aortic valve regurgitation is mild. No aortic stenosis is present.  5. The inferior vena cava is normal in size with greater than 50% respiratory variability, suggesting right atrial pressure of 3 mmHg.   Metastasis to lymph nodes ([place])  10/14/2018 Initial Diagnosis   Metastasis to lymph nodes ([place])   10/21/2018 - 02/24/2019 Chemotherapy   The patient had palonosetron (ALOXI) injection 0.25 mg, 0.25 mg, Intravenous,  Once, 7 of 7 cycles Administration: 0.25 mg (10/21/2018), 0.25 mg (11/11/2018), 0.25 mg (12/02/2018), 0.25 mg (12/23/2018), 0.25 mg (01/13/2019), 0.25 mg (02/03/2019), 0.25 mg (02/24/2019) CARBOplatin (PARAPLATIN) 310 mg in sodium chloride 0.9 % 250 mL chemo infusion, 310 mg, Intravenous,  Once, 7 of 7 cycles Dose modification: 300 mg (original dose 311.5 mg, Cycle 4, Reason: Dose not tolerated) Administration: 310 mg (10/21/2018), 300 mg (11/11/2018), 300 mg (12/02/2018), 300 mg (12/23/2018), 300 mg (01/13/2019), 300 mg (02/03/2019), 300 mg (02/24/2019) PACLitaxel (TAXOL) 222 mg in sodium chloride 0.9 % 250 mL chemo infusion (> 31m/m2), 135 mg/m2 = 222 mg, Intravenous,  Once, 7 of 7 cycles Administration: 222 mg (10/21/2018), 222 mg (11/11/2018), 222 mg (12/02/2018), 222 mg (12/23/2018), 222 mg (01/13/2019), 222 mg (02/03/2019), 222 mg (02/24/2019) fosaprepitant (EMEND) 150 mg, dexamethasone (DECADRON) 12 mg in sodium chloride 0.9 % 145 mL IVPB, , Intravenous,  Once, 7 of 7 cycles Administration:  (10/21/2018),  (11/11/2018),  (12/02/2018),  (12/23/2018),  (01/13/2019),  (02/03/2019),  (02/24/2019)  for chemotherapy treatment.      REVIEW OF SYSTEMS:   Constitutional: Denies fevers, chills or abnormal weight loss Eyes: Denies blurriness of  vision Ears, nose, mouth, throat, and face: Denies mucositis or sore throat Respiratory: Denies cough, dyspnea or wheezes Cardiovascular: Denies palpitation, chest discomfort or lower extremity swelling Gastrointestinal:  Denies nausea, heartburn or change in bowel habits Skin: Denies abnormal skin rashes Lymphatics: Denies new lymphadenopathy or easy bruising Neurological:Denies numbness, tingling or new weaknesses Behavioral/Psych: Mood is stable, no new changes  All other systems were reviewed with the patient and are negative.  I have reviewed the past medical history, past surgical history, social history and family history with the patient and they are unchanged from previous note.  ALLERGIES:  has No Known Allergies.  MEDICATIONS:  Current Outpatient Medications  Medication Sig Dispense Refill  . acetaminophen (TYLENOL) 500 MG tablet Take 1,000 mg by mouth every 6 (six) hours as needed.    .Marland Kitchenaspirin EC 81 MG tablet Take 81 mg by mouth daily.    .Marland Kitchenatorvastatin (LIPITOR) 20 MG tablet Take 20 mg by mouth every evening.     . Calcium Carbonate-Vitamin D (CALCIUM-VITAMIN D) 500-200 MG-UNIT per tablet Take 1 tablet by mouth daily.    . citalopram (CELEXA) 20 MG tablet Take 20 mg by mouth at bedtime.     .Marland Kitchenestradiol (ESTRACE VAGINAL) 0.1 MG/GM vaginal cream Place 1 Applicatorful vaginally 3 (three) times a week. 42.5 g 12  . gabapentin (NEURONTIN) 600 MG tablet Take 600 mg by mouth at bedtime as needed.    .Marland Kitchenlevothyroxine (SYNTHROID, LEVOTHROID) 100 MCG tablet Take 100 mcg by mouth daily before breakfast.     . lidocaine-prilocaine (EMLA) cream Apply to affected area once 30 g 3  . losartan-hydrochlorothiazide (HYZAAR) 100-25 MG tablet     .  magnesium oxide (MAG-OX) 400 (241.3 Mg) MG tablet Take 1 tablet (400 mg total) by mouth daily. 30 tablet 11  . metoprolol succinate (TOPROL-XL) 25 MG 24 hr tablet Take 25 mg by mouth daily.    . Multiple Vitamin (MULTIVITAMIN WITH MINERALS) TABS  Take 1 tablet by mouth daily.    . ondansetron (ZOFRAN) 8 MG tablet Take 1 tablet (8 mg total) by mouth every 8 (eight) hours as needed. Start on the third day after chemotherapy. 30 tablet 1  . pantoprazole (PROTONIX) 40 MG tablet Take 40 mg by mouth every morning.  (Patient not taking: Reported on 02/15/2020)    . valACYclovir (VALTREX) 1000 MG tablet valacyclovir 1 gram tablet     No current facility-administered medications for this visit.   Facility-Administered Medications Ordered in Other Visits  Medication Dose Route Frequency Provider Last Rate Last Admin  . heparin lock flush 100 unit/mL  500 Units Intracatheter Once PRN Alvy Bimler, Alisha Bacus, MD      . sodium chloride flush (NS) 0.9 % injection 10 mL  10 mL Intracatheter PRN Lamondre Wesche, MD      . trastuzumab-dkst (OGIVRI) 357 mg in sodium chloride 0.9 % 250 mL chemo infusion  6 mg/kg (Treatment Plan Recorded) Intravenous Once Alvy Bimler, Jenica Costilow, MD        PHYSICAL EXAMINATION: ECOG PERFORMANCE STATUS: 0 - Asymptomatic  Vitals:   08/08/20 1246  BP: (!) 142/75  Pulse: 75  Resp: 18  Temp: (!) 97.4 F (36.3 C)  SpO2: 93%   Filed Weights   08/08/20 1246  Weight: 134 lb 6.4 oz (61 kg)    GENERAL:alert, no distress and comfortable SKIN: skin color, texture, turgor are normal, no rashes or significant lesions EYES: normal, Conjunctiva are pink and non-injected, sclera clear OROPHARYNX:no exudate, no erythema and lips, buccal mucosa, and tongue normal  NECK: supple, thyroid normal size, non-tender, without nodularity LYMPH:  no palpable lymphadenopathy in the cervical, axillary or inguinal LUNGS: clear to auscultation and percussion with normal breathing effort HEART: regular rate & rhythm and no murmurs and no lower extremity edema ABDOMEN:abdomen soft, non-tender and normal bowel sounds Musculoskeletal:no cyanosis of digits and no clubbing  NEURO: alert & oriented x 3 with fluent speech, no focal motor/sensory deficits  LABORATORY DATA:   I have reviewed the data as listed    Component Value Date/Time   NA 140 08/08/2020 1236   NA 139 02/13/2017 1512   K 3.8 08/08/2020 1236   K 4.2 02/13/2017 1512   CL 100 08/08/2020 1236   CO2 27 08/08/2020 1236   CO2 28 02/13/2017 1512   GLUCOSE 104 (H) 08/08/2020 1236   GLUCOSE 118 02/13/2017 1512   BUN 31 (H) 08/08/2020 1236   BUN 25.2 02/13/2017 1512   CREATININE 1.58 (H) 08/08/2020 1236   CREATININE 1.37 (H) 12/21/2019 0850   CREATININE 1.2 (H) 02/13/2017 1512   CALCIUM 9.2 08/08/2020 1236   CALCIUM 10.0 02/13/2017 1512   PROT 7.1 08/08/2020 1236   ALBUMIN 4.0 08/08/2020 1236   AST 21 08/08/2020 1236   AST 19 12/21/2019 0850   ALT 14 08/08/2020 1236   ALT 13 12/21/2019 0850   ALKPHOS 72 08/08/2020 1236   BILITOT 0.5 08/08/2020 1236   BILITOT 0.4 12/21/2019 0850   GFRNONAA 33 (L) 08/08/2020 1236   GFRNONAA 37 (L) 12/21/2019 0850   GFRAA 47 (L) 03/28/2020 0933   GFRAA 42 (L) 12/21/2019 0850    No results found for: SPEP, UPEP  Lab Results  Component Value Date   WBC 6.8 08/08/2020   NEUTROABS 4.3 08/08/2020   HGB 11.5 (L) 08/08/2020   HCT 33.1 (L) 08/08/2020   MCV 90.7 08/08/2020   PLT 209 08/08/2020      Chemistry      Component Value Date/Time   NA 140 08/08/2020 1236   NA 139 02/13/2017 1512   K 3.8 08/08/2020 1236   K 4.2 02/13/2017 1512   CL 100 08/08/2020 1236   CO2 27 08/08/2020 1236   CO2 28 02/13/2017 1512   BUN 31 (H) 08/08/2020 1236   BUN 25.2 02/13/2017 1512   CREATININE 1.58 (H) 08/08/2020 1236   CREATININE 1.37 (H) 12/21/2019 0850   CREATININE 1.2 (H) 02/13/2017 1512      Component Value Date/Time   CALCIUM 9.2 08/08/2020 1236   CALCIUM 10.0 02/13/2017 1512   ALKPHOS 72 08/08/2020 1236   AST 21 08/08/2020 1236   AST 19 12/21/2019 0850   ALT 14 08/08/2020 1236   ALT 13 12/21/2019 0850   BILITOT 0.5 08/08/2020 1236   BILITOT 0.4 12/21/2019 0850       RADIOGRAPHIC STUDIES: I have personally reviewed the radiological images  as listed and agreed with the findings in the report. ECHOCARDIOGRAM COMPLETE  Result Date: 08/02/2020    ECHOCARDIOGRAM REPORT   Patient Name:   [name] Date of Exam: 08/02/2020 Medical Rec #:  [id number]      Height:       62.2 in Accession #:    [id number]     Weight:       138.0 lb Date of Birth:  1940/11/03      BSA:          1.637 m Patient Age:    80 years       BP:           176/83 mmHg Patient Gender: F              HR:           67 bpm. Exam Location:  Outpatient Procedure: 2D Echo, Cardiac Doppler and Color Doppler Indications:    Chemo Z09  History:        Patient has prior history of Echocardiogram examinations, most                 recent 04/17/2020. Risk Factors:Hypertension.  Sonographer:    Bernadene Person RDCS Referring Phys: [id number] Shalisa Mcquade Bartley  1. Left ventricular ejection fraction, by estimation, is 55 to 60%. The left ventricle has normal function. The left ventricle has no regional wall motion abnormalities. Left ventricular diastolic parameters are consistent with Grade I diastolic dysfunction (impaired relaxation). The average left ventricular global longitudinal strain is -27.0 %. The global longitudinal strain is normal.  2. Right ventricular systolic function is normal. The right ventricular size is normal. Tricuspid regurgitation signal is inadequate for assessing PA pressure.  3. The mitral valve is grossly normal. Trivial mitral valve regurgitation. No evidence of mitral stenosis.  4. The aortic valve is tricuspid. Aortic valve regurgitation is mild. No aortic stenosis is present.  5. The inferior vena cava is normal in size with greater than 50% respiratory variability, suggesting right atrial pressure of 3 mmHg. Comparison(s): No significant change from prior study. LVEF remains normal ~60%. Strain remains normal. FINDINGS  Left Ventricle: Left ventricular ejection fraction, by estimation, is 55 to 60%. The left ventricle has normal function. The left ventricle has  no regional wall motion  abnormalities. The average left ventricular global longitudinal strain is -27.0 %. The global longitudinal strain is normal. The left ventricular internal cavity size was normal in size. There is no left ventricular hypertrophy. Left ventricular diastolic parameters are consistent with Grade I diastolic dysfunction (impaired relaxation). Right Ventricle: The right ventricular size is normal. No increase in right ventricular wall thickness. Right ventricular systolic function is normal. Tricuspid regurgitation signal is inadequate for assessing PA pressure. Left Atrium: Left atrial size was normal in size. Right Atrium: Right atrial size was normal in size. Pericardium: Trivial pericardial effusion is present. Presence of pericardial fat pad. Mitral Valve: The mitral valve is grossly normal. Trivial mitral valve regurgitation. No evidence of mitral valve stenosis. Tricuspid Valve: The tricuspid valve is grossly normal. Tricuspid valve regurgitation is not demonstrated. No evidence of tricuspid stenosis. Aortic Valve: The aortic valve is tricuspid. Aortic valve regurgitation is mild. Aortic regurgitation PHT measures 1124 msec. No aortic stenosis is present. Pulmonic Valve: The pulmonic valve was grossly normal. Pulmonic valve regurgitation is trivial. No evidence of pulmonic stenosis. Aorta: The aortic root is normal in size and structure. Venous: The inferior vena cava is normal in size with greater than 50% respiratory variability, suggesting right atrial pressure of 3 mmHg. IAS/Shunts: The atrial septum is grossly normal.  LEFT VENTRICLE PLAX 2D LVIDd:         4.20 cm  Diastology LVIDs:         3.10 cm  LV e' medial:    6.31 cm/s LV PW:         0.80 cm  LV E/e' medial:  8.7 LV IVS:        0.70 cm  LV e' lateral:   7.10 cm/s LVOT diam:     2.00 cm  LV E/e' lateral: 7.7 LV SV:         60 LV SV Index:   37       2D Longitudinal Strain LVOT Area:     3.14 cm 2D Strain GLS Avg:     -27.0 %   RIGHT VENTRICLE RV S prime:     9.20 cm/s TAPSE (M-mode): 2.2 cm LEFT ATRIUM             Index       RIGHT ATRIUM           Index LA diam:        2.90 cm 1.77 cm/m  RA Area:     12.90 cm LA Vol (A2C):   58.2 ml 35.56 ml/m RA Volume:   29.80 ml  18.21 ml/m LA Vol (A4C):   60.5 ml 36.97 ml/m LA Biplane Vol: 60.1 ml 36.72 ml/m  AORTIC VALVE LVOT Vmax:   82.00 cm/s LVOT Vmean:  60.100 cm/s LVOT VTI:    0.192 m AI PHT:      1124 msec  AORTA Ao Root diam: 3.00 cm MITRAL VALVE MV Area (PHT): 2.66 cm    SHUNTS MV Decel Time: 285 msec    Systemic VTI:  0.19 m MV E velocity: 54.80 cm/s  Systemic Diam: 2.00 cm MV A velocity: 81.40 cm/s MV E/A ratio:  0.67 Eleonore Chiquito MD Electronically signed by Eleonore Chiquito MD Signature Date/Time: 08/02/2020/5:35:50 PM    Final

## 2020-08-08 NOTE — Patient Instructions (Signed)
[facility] Discharge Instructions for Patients Receiving Chemotherapy  Today you received the following chemotherapy agents trastuzumab.  To help prevent nausea and vomiting after your treatment, we encourage you to take your nausea medication as directed.    If you develop nausea and vomiting that is not controlled by your nausea medication, call the clinic.   BELOW ARE SYMPTOMS THAT SHOULD BE REPORTED IMMEDIATELY:  *FEVER GREATER THAN 100.5 F  *CHILLS WITH OR WITHOUT FEVER  NAUSEA AND VOMITING THAT IS NOT CONTROLLED WITH YOUR NAUSEA MEDICATION  *UNUSUAL SHORTNESS OF BREATH  *UNUSUAL BRUISING OR BLEEDING  TENDERNESS IN MOUTH AND THROAT WITH OR WITHOUT PRESENCE OF ULCERS  *URINARY PROBLEMS  *BOWEL PROBLEMS  UNUSUAL RASH Items with * indicate a potential emergency and should be followed up as soon as possible.  Feel free to call the clinic should you have any questions or concerns. The clinic phone number is [phone].  Please show the CHEMO ALERT CARD at check-in to the Emergency Department and triage nurse.   

## 2020-08-08 NOTE — Assessment & Plan Note (Signed)
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 

## 2020-08-09 ENCOUNTER — Telehealth: Payer: Self-pay | Admitting: Hematology and Oncology

## 2020-08-09 DIAGNOSIS — L570 Actinic keratosis: Secondary | ICD-10-CM | POA: Diagnosis not present

## 2020-08-09 DIAGNOSIS — Z85828 Personal history of other malignant neoplasm of skin: Secondary | ICD-10-CM | POA: Diagnosis not present

## 2020-08-09 NOTE — Telephone Encounter (Signed)
Scheduled appts per 1/25 sch msg. Left voicemail with appt dates and times.

## 2020-08-22 DIAGNOSIS — R739 Hyperglycemia, unspecified: Secondary | ICD-10-CM | POA: Diagnosis not present

## 2020-08-22 DIAGNOSIS — E039 Hypothyroidism, unspecified: Secondary | ICD-10-CM | POA: Diagnosis not present

## 2020-08-24 DIAGNOSIS — K219 Gastro-esophageal reflux disease without esophagitis: Secondary | ICD-10-CM | POA: Diagnosis not present

## 2020-08-24 DIAGNOSIS — R739 Hyperglycemia, unspecified: Secondary | ICD-10-CM | POA: Diagnosis not present

## 2020-08-24 DIAGNOSIS — N1832 Chronic kidney disease, stage 3b: Secondary | ICD-10-CM | POA: Diagnosis not present

## 2020-08-24 DIAGNOSIS — R69 Illness, unspecified: Secondary | ICD-10-CM | POA: Diagnosis not present

## 2020-08-24 DIAGNOSIS — M8580 Other specified disorders of bone density and structure, unspecified site: Secondary | ICD-10-CM | POA: Diagnosis not present

## 2020-08-24 DIAGNOSIS — J309 Allergic rhinitis, unspecified: Secondary | ICD-10-CM | POA: Diagnosis not present

## 2020-08-24 DIAGNOSIS — E039 Hypothyroidism, unspecified: Secondary | ICD-10-CM | POA: Diagnosis not present

## 2020-08-24 DIAGNOSIS — I10 Essential (primary) hypertension: Secondary | ICD-10-CM | POA: Diagnosis not present

## 2020-08-24 DIAGNOSIS — E785 Hyperlipidemia, unspecified: Secondary | ICD-10-CM | POA: Diagnosis not present

## 2020-08-29 ENCOUNTER — Inpatient Hospital Stay: Payer: Medicare HMO

## 2020-08-29 ENCOUNTER — Other Ambulatory Visit: Payer: Self-pay

## 2020-08-29 ENCOUNTER — Inpatient Hospital Stay: Payer: Medicare HMO | Attending: Gynecologic Oncology

## 2020-08-29 VITALS — BP 132/61 | HR 71 | Temp 98.2°F | Resp 18

## 2020-08-29 DIAGNOSIS — Z90722 Acquired absence of ovaries, bilateral: Secondary | ICD-10-CM | POA: Insufficient documentation

## 2020-08-29 DIAGNOSIS — C541 Malignant neoplasm of endometrium: Secondary | ICD-10-CM

## 2020-08-29 DIAGNOSIS — Z5112 Encounter for antineoplastic immunotherapy: Secondary | ICD-10-CM | POA: Insufficient documentation

## 2020-08-29 DIAGNOSIS — Z79899 Other long term (current) drug therapy: Secondary | ICD-10-CM | POA: Diagnosis not present

## 2020-08-29 DIAGNOSIS — Z9079 Acquired absence of other genital organ(s): Secondary | ICD-10-CM | POA: Insufficient documentation

## 2020-08-29 DIAGNOSIS — T451X5D Adverse effect of antineoplastic and immunosuppressive drugs, subsequent encounter: Secondary | ICD-10-CM | POA: Diagnosis not present

## 2020-08-29 DIAGNOSIS — Z90710 Acquired absence of both cervix and uterus: Secondary | ICD-10-CM | POA: Insufficient documentation

## 2020-08-29 DIAGNOSIS — Z7982 Long term (current) use of aspirin: Secondary | ICD-10-CM | POA: Insufficient documentation

## 2020-08-29 DIAGNOSIS — N183 Chronic kidney disease, stage 3 unspecified: Secondary | ICD-10-CM | POA: Diagnosis not present

## 2020-08-29 DIAGNOSIS — D6481 Anemia due to antineoplastic chemotherapy: Secondary | ICD-10-CM | POA: Insufficient documentation

## 2020-08-29 DIAGNOSIS — C779 Secondary and unspecified malignant neoplasm of lymph node, unspecified: Secondary | ICD-10-CM | POA: Diagnosis present

## 2020-08-29 LAB — COMPREHENSIVE METABOLIC PANEL
ALT: 14 U/L (ref 0–44)
AST: 20 U/L (ref 15–41)
Albumin: 3.8 g/dL (ref 3.5–5.0)
Alkaline Phosphatase: 63 U/L (ref 38–126)
Anion gap: 9 (ref 5–15)
BUN: 25 mg/dL — ABNORMAL HIGH (ref 8–23)
CO2: 25 mmol/L (ref 22–32)
Calcium: 8.7 mg/dL — ABNORMAL LOW (ref 8.9–10.3)
Chloride: 103 mmol/L (ref 98–111)
Creatinine, Ser: 1.59 mg/dL — ABNORMAL HIGH (ref 0.44–1.00)
GFR, Estimated: 33 mL/min — ABNORMAL LOW (ref >60)
Glucose, Bld: 86 mg/dL (ref 70–99)
Potassium: 4.1 mmol/L (ref 3.5–5.1)
Sodium: 137 mmol/L (ref 135–145)
Total Bilirubin: 0.3 mg/dL (ref 0.3–1.2)
Total Protein: 6.9 g/dL (ref 6.5–8.1)

## 2020-08-29 LAB — CBC WITH DIFFERENTIAL/PLATELET
Abs Immature Granulocytes: 0.06 10*3/uL (ref 0.00–0.07)
Basophils Absolute: 0.1 10*3/uL (ref 0.0–0.1)
Basophils Relative: 1 %
Eosinophils Absolute: 0.2 10*3/uL (ref 0.0–0.5)
Eosinophils Relative: 3 %
HCT: 30.6 % — ABNORMAL LOW (ref 36.0–46.0)
Hemoglobin: 10.5 g/dL — ABNORMAL LOW (ref 12.0–15.0)
Immature Granulocytes: 1 %
Lymphocytes Relative: 20 %
Lymphs Abs: 1.7 10*3/uL (ref 0.7–4.0)
MCH: 31.3 pg (ref 26.0–34.0)
MCHC: 34.3 g/dL (ref 30.0–36.0)
MCV: 91.3 fL (ref 80.0–100.0)
Monocytes Absolute: 0.7 10*3/uL (ref 0.1–1.0)
Monocytes Relative: 9 %
Neutro Abs: 5.6 10*3/uL (ref 1.7–7.7)
Neutrophils Relative %: 66 %
Platelets: 209 10*3/uL (ref 150–400)
RBC: 3.35 MIL/uL — ABNORMAL LOW (ref 3.87–5.11)
RDW: 12.3 % (ref 11.5–15.5)
WBC: 8.4 10*3/uL (ref 4.0–10.5)
nRBC: 0 % (ref 0.0–0.2)

## 2020-08-29 MED ORDER — SODIUM CHLORIDE 0.9% FLUSH
10.0000 mL | INTRAVENOUS | Status: DC | PRN
  Administered 2020-08-29: 10 mL
  Filled 2020-08-29: qty 10

## 2020-08-29 MED ORDER — TRASTUZUMAB-DKST CHEMO 150 MG IV SOLR
6.0000 mg/kg | Freq: Once | INTRAVENOUS | Status: AC
  Administered 2020-08-29: 357 mg via INTRAVENOUS
  Filled 2020-08-29: qty 17

## 2020-08-29 MED ORDER — SODIUM CHLORIDE 0.9% FLUSH
10.0000 mL | Freq: Once | INTRAVENOUS | Status: AC
  Administered 2020-08-29: 10 mL
  Filled 2020-08-29: qty 10

## 2020-08-29 MED ORDER — SODIUM CHLORIDE 0.9 % IV SOLN
Freq: Once | INTRAVENOUS | Status: AC
  Filled 2020-08-29: qty 250

## 2020-08-29 MED ORDER — DIPHENHYDRAMINE HCL 25 MG PO TABS
25.0000 mg | ORAL_TABLET | Freq: Once | ORAL | Status: AC
  Administered 2020-08-29: 25 mg via ORAL
  Filled 2020-08-29: qty 1

## 2020-08-29 MED ORDER — HEPARIN SOD (PORK) LOCK FLUSH 100 UNIT/ML IV SOLN
500.0000 [IU] | Freq: Once | INTRAVENOUS | Status: AC | PRN
  Administered 2020-08-29: 500 [IU]
  Filled 2020-08-29: qty 5

## 2020-08-29 MED ORDER — ACETAMINOPHEN 325 MG PO TABS
650.0000 mg | ORAL_TABLET | Freq: Once | ORAL | Status: AC
  Administered 2020-08-29: 650 mg via ORAL

## 2020-08-29 NOTE — Patient Instructions (Signed)

## 2020-08-29 NOTE — Patient Instructions (Signed)
[facility] Discharge Instructions for Patients Receiving Chemotherapy  Today you received the following chemotherapy agents trastuzumab.  To help prevent nausea and vomiting after your treatment, we encourage you to take your nausea medication as directed.    If you develop nausea and vomiting that is not controlled by your nausea medication, call the clinic.   BELOW ARE SYMPTOMS THAT SHOULD BE REPORTED IMMEDIATELY:  *FEVER GREATER THAN 100.5 F  *CHILLS WITH OR WITHOUT FEVER  NAUSEA AND VOMITING THAT IS NOT CONTROLLED WITH YOUR NAUSEA MEDICATION  *UNUSUAL SHORTNESS OF BREATH  *UNUSUAL BRUISING OR BLEEDING  TENDERNESS IN MOUTH AND THROAT WITH OR WITHOUT PRESENCE OF ULCERS  *URINARY PROBLEMS  *BOWEL PROBLEMS  UNUSUAL RASH Items with * indicate a potential emergency and should be followed up as soon as possible.  Feel free to call the clinic should you have any questions or concerns. The clinic phone number is [phone].  Please show the CHEMO ALERT CARD at check-in to the Emergency Department and triage nurse.   

## 2020-08-31 DIAGNOSIS — C541 Malignant neoplasm of endometrium: Secondary | ICD-10-CM | POA: Diagnosis not present

## 2020-09-15 ENCOUNTER — Ambulatory Visit (HOSPITAL_COMMUNITY): Payer: Medicare HMO

## 2020-09-18 ENCOUNTER — Ambulatory Visit (HOSPITAL_COMMUNITY)
Admission: RE | Admit: 2020-09-18 | Discharge: 2020-09-18 | Disposition: A | Discharge status: Home / Self Care | Payer: Medicare HMO | Source: Ambulatory Visit | Attending: Hematology and Oncology | Admitting: Hematology and Oncology

## 2020-09-18 ENCOUNTER — Inpatient Hospital Stay: Payer: Medicare HMO | Attending: Gynecologic Oncology

## 2020-09-18 ENCOUNTER — Other Ambulatory Visit: Payer: Self-pay

## 2020-09-18 DIAGNOSIS — C779 Secondary and unspecified malignant neoplasm of lymph node, unspecified: Secondary | ICD-10-CM | POA: Diagnosis present

## 2020-09-18 DIAGNOSIS — I313 Pericardial effusion (noninflammatory): Secondary | ICD-10-CM | POA: Diagnosis not present

## 2020-09-18 DIAGNOSIS — C541 Malignant neoplasm of endometrium: Secondary | ICD-10-CM

## 2020-09-18 DIAGNOSIS — D631 Anemia in chronic kidney disease: Secondary | ICD-10-CM | POA: Insufficient documentation

## 2020-09-18 DIAGNOSIS — Z853 Personal history of malignant neoplasm of breast: Secondary | ICD-10-CM | POA: Insufficient documentation

## 2020-09-18 DIAGNOSIS — K3189 Other diseases of stomach and duodenum: Secondary | ICD-10-CM | POA: Diagnosis not present

## 2020-09-18 DIAGNOSIS — Z8542 Personal history of malignant neoplasm of other parts of uterus: Secondary | ICD-10-CM | POA: Diagnosis not present

## 2020-09-18 DIAGNOSIS — K760 Fatty (change of) liver, not elsewhere classified: Secondary | ICD-10-CM | POA: Insufficient documentation

## 2020-09-18 DIAGNOSIS — K7689 Other specified diseases of liver: Secondary | ICD-10-CM | POA: Diagnosis not present

## 2020-09-18 DIAGNOSIS — I2510 Atherosclerotic heart disease of native coronary artery without angina pectoris: Secondary | ICD-10-CM | POA: Diagnosis not present

## 2020-09-18 DIAGNOSIS — C773 Secondary and unspecified malignant neoplasm of axilla and upper limb lymph nodes: Secondary | ICD-10-CM | POA: Insufficient documentation

## 2020-09-18 DIAGNOSIS — N183 Chronic kidney disease, stage 3 unspecified: Secondary | ICD-10-CM | POA: Diagnosis not present

## 2020-09-18 DIAGNOSIS — Z90722 Acquired absence of ovaries, bilateral: Secondary | ICD-10-CM | POA: Insufficient documentation

## 2020-09-18 DIAGNOSIS — Z90710 Acquired absence of both cervix and uterus: Secondary | ICD-10-CM | POA: Insufficient documentation

## 2020-09-18 DIAGNOSIS — Z7982 Long term (current) use of aspirin: Secondary | ICD-10-CM | POA: Diagnosis not present

## 2020-09-18 DIAGNOSIS — K5730 Diverticulosis of large intestine without perforation or abscess without bleeding: Secondary | ICD-10-CM | POA: Diagnosis not present

## 2020-09-18 DIAGNOSIS — R918 Other nonspecific abnormal finding of lung field: Secondary | ICD-10-CM | POA: Diagnosis not present

## 2020-09-18 DIAGNOSIS — Z9079 Acquired absence of other genital organ(s): Secondary | ICD-10-CM | POA: Insufficient documentation

## 2020-09-18 DIAGNOSIS — Z5112 Encounter for antineoplastic immunotherapy: Secondary | ICD-10-CM | POA: Diagnosis present

## 2020-09-18 DIAGNOSIS — J9811 Atelectasis: Secondary | ICD-10-CM | POA: Diagnosis not present

## 2020-09-18 DIAGNOSIS — Z79899 Other long term (current) drug therapy: Secondary | ICD-10-CM | POA: Diagnosis not present

## 2020-09-18 LAB — CBC WITH DIFFERENTIAL/PLATELET
Abs Immature Granulocytes: 0.03 10*3/uL (ref 0.00–0.07)
Basophils Absolute: 0.1 10*3/uL (ref 0.0–0.1)
Basophils Relative: 1 %
Eosinophils Absolute: 0.2 10*3/uL (ref 0.0–0.5)
Eosinophils Relative: 2 %
HCT: 32.1 % — ABNORMAL LOW (ref 36.0–46.0)
Hemoglobin: 11.2 g/dL — ABNORMAL LOW (ref 12.0–15.0)
Immature Granulocytes: 0 %
Lymphocytes Relative: 20 %
Lymphs Abs: 1.6 10*3/uL (ref 0.7–4.0)
MCH: 31.7 pg (ref 26.0–34.0)
MCHC: 34.9 g/dL (ref 30.0–36.0)
MCV: 90.9 fL (ref 80.0–100.0)
Monocytes Absolute: 0.6 10*3/uL (ref 0.1–1.0)
Monocytes Relative: 7 %
Neutro Abs: 5.4 10*3/uL (ref 1.7–7.7)
Neutrophils Relative %: 70 %
Platelets: 259 10*3/uL (ref 150–400)
RBC: 3.53 MIL/uL — ABNORMAL LOW (ref 3.87–5.11)
RDW: 12 % (ref 11.5–15.5)
WBC: 7.8 10*3/uL (ref 4.0–10.5)
nRBC: 0 % (ref 0.0–0.2)

## 2020-09-18 LAB — COMPREHENSIVE METABOLIC PANEL
ALT: 13 U/L (ref 0–44)
AST: 19 U/L (ref 15–41)
Albumin: 4 g/dL (ref 3.5–5.0)
Alkaline Phosphatase: 70 U/L (ref 38–126)
Anion gap: 9 (ref 5–15)
BUN: 24 mg/dL — ABNORMAL HIGH (ref 8–23)
CO2: 28 mmol/L (ref 22–32)
Calcium: 9.2 mg/dL (ref 8.9–10.3)
Chloride: 97 mmol/L — ABNORMAL LOW (ref 98–111)
Creatinine, Ser: 1.33 mg/dL — ABNORMAL HIGH (ref 0.44–1.00)
GFR, Estimated: 40 mL/min — ABNORMAL LOW (ref >60)
Glucose, Bld: 113 mg/dL — ABNORMAL HIGH (ref 70–99)
Potassium: 3.9 mmol/L (ref 3.5–5.1)
Sodium: 134 mmol/L — ABNORMAL LOW (ref 135–145)
Total Bilirubin: 0.5 mg/dL (ref 0.3–1.2)
Total Protein: 7.2 g/dL (ref 6.5–8.1)

## 2020-09-18 MED ORDER — IOHEXOL 300 MG/ML  SOLN
75.0000 mL | Freq: Once | INTRAMUSCULAR | Status: AC | PRN
  Administered 2020-09-18: 75 mL via INTRAVENOUS

## 2020-09-19 ENCOUNTER — Inpatient Hospital Stay: Payer: Medicare HMO | Admitting: Hematology and Oncology

## 2020-09-19 ENCOUNTER — Inpatient Hospital Stay: Payer: Medicare HMO

## 2020-09-19 ENCOUNTER — Telehealth: Payer: Self-pay | Admitting: Hematology and Oncology

## 2020-09-19 ENCOUNTER — Telehealth: Payer: Self-pay

## 2020-09-19 ENCOUNTER — Other Ambulatory Visit: Payer: Self-pay

## 2020-09-19 DIAGNOSIS — K760 Fatty (change of) liver, not elsewhere classified: Secondary | ICD-10-CM | POA: Diagnosis not present

## 2020-09-19 DIAGNOSIS — Z79899 Other long term (current) drug therapy: Secondary | ICD-10-CM | POA: Diagnosis not present

## 2020-09-19 DIAGNOSIS — R918 Other nonspecific abnormal finding of lung field: Secondary | ICD-10-CM

## 2020-09-19 DIAGNOSIS — D630 Anemia in neoplastic disease: Secondary | ICD-10-CM | POA: Diagnosis not present

## 2020-09-19 DIAGNOSIS — C541 Malignant neoplasm of endometrium: Secondary | ICD-10-CM

## 2020-09-19 DIAGNOSIS — Z853 Personal history of malignant neoplasm of breast: Secondary | ICD-10-CM

## 2020-09-19 DIAGNOSIS — N183 Chronic kidney disease, stage 3 unspecified: Secondary | ICD-10-CM | POA: Diagnosis not present

## 2020-09-19 DIAGNOSIS — Z5112 Encounter for antineoplastic immunotherapy: Secondary | ICD-10-CM | POA: Diagnosis not present

## 2020-09-19 DIAGNOSIS — Z7982 Long term (current) use of aspirin: Secondary | ICD-10-CM | POA: Diagnosis not present

## 2020-09-19 DIAGNOSIS — D631 Anemia in chronic kidney disease: Secondary | ICD-10-CM | POA: Diagnosis not present

## 2020-09-19 DIAGNOSIS — C779 Secondary and unspecified malignant neoplasm of lymph node, unspecified: Secondary | ICD-10-CM | POA: Diagnosis not present

## 2020-09-19 MED ORDER — SODIUM CHLORIDE 0.9% FLUSH
10.0000 mL | Freq: Once | INTRAVENOUS | Status: AC
  Administered 2020-09-19: 10 mL
  Filled 2020-09-19: qty 10

## 2020-09-19 MED ORDER — DIPHENHYDRAMINE HCL 25 MG PO CAPS
ORAL_CAPSULE | ORAL | Status: AC
  Filled 2020-09-19: qty 2

## 2020-09-19 MED ORDER — TRASTUZUMAB-DKST CHEMO 150 MG IV SOLR
6.0000 mg/kg | Freq: Once | INTRAVENOUS | Status: AC
  Administered 2020-09-19: 357 mg via INTRAVENOUS
  Filled 2020-09-19: qty 17

## 2020-09-19 MED ORDER — SODIUM CHLORIDE 0.9 % IV SOLN
Freq: Once | INTRAVENOUS | Status: AC
  Filled 2020-09-19: qty 250

## 2020-09-19 MED ORDER — ACETAMINOPHEN 325 MG PO TABS
ORAL_TABLET | ORAL | Status: AC
  Filled 2020-09-19: qty 2

## 2020-09-19 MED ORDER — ACETAMINOPHEN 325 MG PO TABS
650.0000 mg | ORAL_TABLET | Freq: Once | ORAL | Status: AC
  Administered 2020-09-19: 650 mg via ORAL

## 2020-09-19 MED ORDER — DIPHENHYDRAMINE HCL 25 MG PO TABS
25.0000 mg | ORAL_TABLET | Freq: Once | ORAL | Status: AC
  Administered 2020-09-19: 25 mg via ORAL
  Filled 2020-09-19: qty 1

## 2020-09-19 NOTE — Progress Notes (Signed)
[name] OFFICE PROGRESS NOTE  Patient Care Team: Vicki Bunting, [name]  ASSESSMENT & PLAN:  Endometrial cancer [facility]) I have reviewed multiple imaging studies with the patient She has no signs of cancer recurrence I recommend continued imaging study every 6 months, next would be due around September She will be due for echocardiogram in April  CKD (chronic kidney disease), stage III ([place]) Renal function is stable. Monitor closely She was seen by nephrologist recently  History of breast cancer CT imaging show no signs of cancer recurrence Observe closely for now  Pulmonary nodules/lesions, multiple Her lung nodules are stable She is not symptomatic Observe with serial imaging studies  Anemia in neoplastic disease She has multifactorial anemia, anemia secondary to prior chemotherapy as well as chronic kidney disease Anemia is improving/stable Recent vitamin B12 level was adequate She is not symptomatic Observe only  Hepatic steatosis We discussed the importance of lifestyle modification and dietary changes   Orders Placed This Encounter  Procedures  . ECHOCARDIOGRAM COMPLETE    Standing Status:   Future    Standing Expiration Date:   09/19/2021    Order Specific Question:   Where should this test be performed    Answer:   [facility]    Order Specific Question:   Perflutren DEFINITY (image enhancing agent) should be administered unless hypersensitivity or allergy exist    Answer:   Administer Perflutren    Order Specific Question:   Reason for exam-Echo    Answer:   Chemo  [id number]    All questions were answered. The patient knows to call the clinic with any problems, questions or concerns. The total time spent in the appointment was 30 minutes encounter with patients including review of chart and various tests results, discussions about plan of care and coordination of care plan   Vicki Lark, MD 09/19/2020 1:24  PM  INTERVAL HISTORY: Please see below for problem oriented charting. She feels well She denies side effects from [name] No recent signs or symptoms of congestive heart failure Her appetite is fair  SUMMARY OF ONCOLOGIC HISTORY: Oncology History Overview Note  Vicki Cervantes  has a remote history of left  breast cancer at age 80 but received BRCA testing approximately 5 years ago which was negative. Her cancer was treated with surgery but no radiation or chemotherapy.   Serous endometrial cancer, MSI stable ER positive, PR neg, Her2/neu 3+      Endometrial cancer ([place])  08/29/2016 Pathology Results   Endometrium, biopsy - HIGH GRADE ENDOMETRIAL CARCINOMA, SEE COMMENT. Microscopic Comment The sections show multiple fragments of adenocarcinoma displaying glandular and papillary patterns associated with high grade cytomorphology characterized by nuclear pleomorphism, prominent nucleoli and brisk mitosis. Immunohistochemical stains show that the tumor cells are positive for vimentin, p16, p53 with increased Ki-67 expression. Estrogen and progesterone receptor stains show patchy weak positivity. No significant positivity is seen with CEA. The findings are consistent with high grade endometrial carcinoma and the overall morphology and phenotypic features favor serous carcinoma.   08/29/2016 Initial Diagnosis   She presented with postmenopausal bleeding   10/03/2016 Imaging   CT C/A/P 09/2016 IMPRESSION: 1. Thickening of the endometrial canal up to 19 mm in fundus, presumably corresponding to the patient's reported endometrial carcinoma. 2. Multiple tiny pulmonary nodules scattered throughout the lungs bilaterally measuring 4 mm or less in size. Nodules of this size are typically considered statistically likely benign. In the setting of known primary  malignancy, metastatic disease to the lungs is not excluded, but is not strongly favored on today's examination. Attention on followup studies  is recommended to ensure the stability or resolution of these nodules. 3. Subcentimeter low-attenuation lesion in the central aspect of segment 8 of the liver is too small to characterize. This is statistically likely a tiny cyst, but warrants attention on follow-up studies to exclude the possibility of a solitary hepatic metastasis. 4. 1.5 x 1.5 x 1.7 cm well-circumscribed lesion in the proximal stomach. This is of uncertain etiology and significance, and could represent a benign gastric polyp, however, further evaluation with nonemergent endoscopy is suggested in the near future for further evaluation. 5. **An incidental finding of potential clinical significance has been found. 1.1 x 1.6 cm thyroid nodule in the inferior aspect of the right lobe of the thyroid gland. Follow-up evaluation with nonemergent thyroid ultrasound is recommended in the near future to better evaluate this finding. This recommendation follows ACR consensuss guidelines: Managing Incidental Thyroid Nodules Detected on Imaging: White Paper of the ACR Incidental Thyroid Findings Committee. J Am Coll Radiol 2015;12(2):143-150.** 6. Aortic atherosclerosis, in addition to left anterior descending coronary artery disease   10/22/2016 Surgery   Robotic assisted total hysterectomy, BSO and bilateral pelvic lymphadenectomy  Final pathology revealed a 3cm polyp containing serous carcinoma but with no myometrial invasion, no LVSI and negative nodes.  Stage IA Uterine serous cancer   10/22/2016 Pathology Results   1. Lymph nodes, regional resection, right pelvic - SIX BENIGN LYMPH NODES (0/6). 2. Lymph nodes, regional resection, left pelvic - SEVEN BENIGN LYMPH NODES (0/7). 3. Uterus +/- tubes/ovaries, neoplastic, with right ovary and fallopian tube ENDOMYOMETRIUM - SEROUS CARCINOMA ARISING WITHIN AN ENDOMETRIAL POLYP - NO MYOMETRIAL INVASION IDENTIFIED - ADENOMYOSIS - LEIOMYOMA (1 CM) - SEE ONCOLOGY TABLE AND COMMENT CERVIX -  CARCINOMA FOCALLY INVOLVES ENDOCERVICAL GLANDS - NABOTHIAN CYSTS RIGHT ADNEXA - BENIGN OVARY AND FALLOPIAN TUBE - NO CARCINOMA IDENTIFIED 4. Cul-de-sac biopsy - MESOTHELIAL HYPERPLASIA Microscopic Comment 3. ONCOLOGY TABLE-UTERUS, CARCINOMA OR CARCINOSARCOMA Specimen: Uterus, right fallopian tube and ovary Procedure: Total hysterectomy and right salpingo-oophorectomy Lymph node sampling performed: Bilateral pelvic regional resection Specimen integrity: Intact Maximum tumor size: 3 cm (polyp) Histologic type: Serous carcinoma Grade: High grade Myometrial invasion: Not identified Cervical stromal involvement: No, focal endocervical gland involvement Extent of involvement of other organs: Not identified Lymph - vascular invasion: Not identified Peritoneal washings: N/A Lymph nodes: Examined: 0 Sentinel 13 Non-sentinel 13 Total Lymph nodes with metastasis: 0 Isolated tumor cells (< 0.2 mm): 0 Micrometastasis: (> 0.2 mm and < 2.0 mm): 0 Macrometastasis: (> 2.0 mm): 0 Extracapsular extension: N/A Pelvic lymph nodes: 0 involved of 13 lymph nodes. Para-aortic lymph nodes: No para-aortic nodes submitted TNM code: pT1a, pNX FIGO Stage (based on pathologic findings, needs clinical correlation): IA Comment: Immunohistochemistry for cytokeratin AE1/AE3 is performed on all of the lymph nodes (parts 1 & 2) and no metastatic carcinoma is identified.   07/04/2017 Genetic Testing   The patient had genetic testing due to a personal history of breast and uterine cancer, and a family history of stomach cancer.  The Multi-Cancer Panel was ordered. The Multi-Cancer Panel offered by [facility] includes sequencing and/or deletion duplication testing of the following 83 genes: ALK, APC, ATM, AXIN2,BAP1,  BARD1, BLM, BMPR1A, BRCA1, BRCA2, BRIP1, CASR, CDC73, CDH1, CDK4, CDKN1B, CDKN1C, CDKN2A (p14ARF), CDKN2A (p16INK4a), CEBPA, CHEK2, CTNNA1, DICER1, DIS3L2, EGFR (c.2369C>T, p.Thr790Met variant only), EPCAM  (Deletion/duplication testing only), FH, FLCN, GATA2, GPC3, GREM1 (Promoter  region deletion/duplication testing only), HOXB13 (c.251G>A, p.Gly84Glu), HRAS, KIT, MAX, MEN1, MET, MITF (c.952G>A, p.Glu318Lys variant only), MLH1, MSH2, MSH3, MSH6, MUTYH, NBN, NF1, NF2, NTHL1, PALB2, PDGFRA, PHOX2B, PMS2, POLD1, POLE, POT1, PRKAR1A, PTCH1, PTEN, RAD50, RAD51C, RAD51D, RB1, RECQL4, RET, RUNX1, SDHAF2, SDHA (sequence changes only), SDHB, SDHC, SDHD, SMAD4, SMARCA4, SMARCB1, SMARCE1, STK11, SUFU, TERC, TERT, TMEM127, TP53, TSC1, TSC2, VHL, WRN and WT1.   Results: No pathogenic variants were identified.  A variant of uncertain significance in the gene APC was identified.  c.791A>G (p.Gln264Arg).  The date of this test report is 07/04/2017.     Genetic Testing   Patient has genetic testing done for MSI. 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    12/03/2017 Imaging   CT Chest/Abd/Pelvis to follow pulmonary nodule and gastric mass IMPRESSION: 1. Stable CT of the chest. Small pulmonary nodules are unchanged when compared with previous exam. 2. No new findings identified. 3. Subcentimeter low-attenuation lesions within the liver are remain too small to characterize but are stable from prior exam. 4. Persistent indeterminate low-attenuation structure within the proximal stomach is unchanged measuring 1.4 cm. Correlation with direct visualization is advised   06/30/2018 Imaging   CT CHEST Lungs/Pleura: Stable scattered sub-cm pulmonary nodules are again seen bilaterally and are stable compared to previous studies. No new or enlarging pulmonary nodules or masses identified. No evidence of pulmonary infiltrate or pleural effusion.   08/19/2018 Echocardiogram   [place] is done in [place]: EF 60-65%. Mild impaired relaxation. (report scanned)   09/17/2018 Relapse/Recurrence   Presented with c/o vagina discharge.  Lesion noted at the left vaginal apex 13mm lesion removed Path c/w high  grade serous cancer   09/17/2018 Pathology Results   Vagina, biopsy, left apex - HIGH GRADE SEROUS CARCINOMA.   09/28/2018 PET scan   1. Two hypermetabolic axial lymph nodes. Unusual site for metastatic endometrial carcinoma however the activity is more intense than typically seen in reactive adenopathy. Suggest ultrasound-guided percutaneous biopsy of the larger RIGHT axial lymph node 2. No evidence of local recurrence at the vaginal cuff.  3. No metastatic adenopathy in the abdomen or pelvis. 4. Stable small pulmonary nodules   10/09/2018 Pathology Results   Lymph node, needle/core biopsy, right axilla - METASTATIC CARCINOMA, SEE COMMENT. Microscopic Comment The carcinoma appears high grade. Immunohistochemistry is positive for cytokeratin 7, PAX-8, and ER. Cytokeratin 20, CDX-2, PR, and GATA-3 are negative. The findings along with the history are consistent with a gynecologic primary.    10/09/2018 Procedure   Ultrasound-guided core biopsies of a right axillary lymph node.   10/14/2018 Cancer Staging   Staging form: Corpus Uteri - Carcinoma and Carcinosarcoma, AJCC 8th Edition - Clinical: Stage IVB (cT1, cN0, pM1) - Signed by Vicki Lark, MD on 10/14/2018   10/19/2018 Imaging   Placement of single lumen port a cath via right internal jugular vein. The catheter tip lies at the cavo-atrial junction. A power injectable port a cath was placed and is ready for immediate use.    10/21/2018 -  Chemotherapy   The patient had carboplatin and taxol for treatment   11/11/2018 -  Chemotherapy      Patient is on Antibody Plan: BREAST OGIVRI Q21D    12/21/2018 PET scan   1. Interval resolution of hypermetabolic right axillary lymph nodes. No metabolic findings highly suspicious for recurrent metastatic disease. 2. New mild hypermetabolism within a borderline prominent portacaval lymph node, nonspecific. While a reactive node is favored, a lymph node  metastasis cannot be entirely excluded. Suggest  attention to this lymph node on follow-up PET-CT in 3-6 months. 3. Nonspecific new hypermetabolism at the ileocecal valve, more likely physiologic given absence of CT correlate. 4. Scattered subcentimeter pulmonary nodules are all stable and below PET resolution, more likely benign, continued CT surveillance advised. 5. Chronic findings include: Aortic Atherosclerosis (ICD10-I70.0). Marked diffuse colonic diverticulosis. Coronary atherosclerosis.    12/28/2018 Echocardiogram   1. The left ventricle has normal systolic function with an ejection fraction of 60-65%. The cavity size was normal. Left ventricular diastolic Doppler parameters are consistent with impaired relaxation.  2. GLS recorded as -10.7 but LV appears hyperdynamic and tracking of endocardium appears poor.  3. The right ventricle has normal systolic function. The cavity was normal. There is no increase in right ventricular wall thickness.  4. Mild thickening of the mitral valve leaflet.  5. The aortic valve was not well visualized. Mild thickening of the aortic valve. Aortic valve regurgitation is trivial by color flow Doppler.   03/23/2019 Imaging   PET 1. No findings of hypermetabolic residual/recurrent or metastatic disease. 2. Similar low-level hypermetabolism within normal sized portocaval and right inguinal nodes, favored to be reactive. 3. Ongoing stability of small bilateral pulmonary nodules, favored to be benign. Below PET resolution. 4. Coronary artery atherosclerosis. Aortic Atherosclerosis   03/26/2019 Echocardiogram   1. The left ventricle has normal systolic function, with an ejection fraction of 55-60%. The cavity size was normal. Left ventricular diastolic Doppler parameters are consistent with impaired relaxation.  2. The right ventricle has normal systolic function. The cavity was normal.  3. The mitral valve is abnormal. Mild thickening of the mitral valve leaflet. There is mild mitral annular calcification  present.  4. The tricuspid valve is grossly normal.  5. The aortic valve is tricuspid. Mild calcification of the aortic valve. No stenosis of the aortic valve.  6. The aorta is normal unless otherwise noted.  7. Normal LV systolic function; grade 1 diastolic dysfunction; JAS-50.5%.   06/30/2019 Imaging   Chest Impression:   1. No evidence thoracic metastasis. 2. Stable small benign-appearing pulmonary nodules   Abdomen / Pelvis Impression:   1. No evidence of metastatic disease in the abdomen pelvis. 2.  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 3.  Aortic Atherosclerosis (ICD10-I70.0).   06/30/2019 Echocardiogram    1. Left ventricular ejection fraction, by visual estimation, is 60 to 65%. The left ventricle has normal function. There is no left ventricular hypertrophy.  2. Left ventricular diastolic parameters are consistent with Grade I diastolic dysfunction (impaired relaxation).  3. The left ventricle has no regional wall motion abnormalities.  4. Global right ventricle has normal systolic function.The right ventricular size is normal. No increase in right ventricular wall thickness.  5. Left atrial size was mild-moderately dilated.  6. Right atrial size was normal.  7. The mitral valve is normal in structure. Trivial mitral valve regurgitation.  8. The tricuspid valve is normal in structure. Tricuspid valve regurgitation is trivial.  9. The aortic valve is normal in structure. Aortic valve regurgitation is trivial. No evidence of aortic valve sclerosis or stenosis. 10. The pulmonic valve was grossly normal. Pulmonic valve regurgitation is not visualized. 11. Mildly elevated pulmonary artery systolic pressure. 12. The inferior vena cava is normal in size with greater than 50% respiratory variability, suggesting right atrial pressure of 3 mmHg. 13. The average left ventricular global longitudinal strain is -12.5 %. GLS underestimated due to poor endocardial tracking.  09/27/2019 Imaging   1. Multiple small, nonspecific pulmonary nodules are again noted. The previously noted lung nodules are unchanged in size from previous exam. There is a new lung nodule within the medial right lower lobe which is nonspecific measure 4 mm. Attention at follow-up imaging is recommended. 2. No evidence of metastatic disease within the abdomen or pelvis. 3. Coronary artery calcifications.  The 4.  Aortic Atherosclerosis (ICD10-I70.0).   09/30/2019 Echocardiogram    1. Left ventricular ejection fraction, by estimation, is 60 to 65%. The left ventricle has normal function. The left ventricle has no regional wall motion abnormalities. Left ventricular diastolic parameters are consistent with Grade I diastolic dysfunction (impaired relaxation).  2. Right ventricular systolic function is normal. The right ventricular size is normal. There is normal pulmonary artery systolic pressure. The estimated right ventricular systolic pressure is 97.3 mmHg.  3. The mitral valve is normal in structure. No evidence of mitral valve regurgitation. No evidence of mitral stenosis.  4. The aortic valve is normal in structure. Aortic valve regurgitation is trivial. No aortic stenosis is present.  5. The inferior vena cava is normal in size with greater than 50% respiratory variability, suggesting right atrial pressure of 3 mmHg.     01/07/2020 Echocardiogram    1. Normal LV function; grade 1 diastolic dysfunction; mild AI; GLS -21%.  2. Left ventricular ejection fraction, by estimation, is 55 to 60%. The left ventricle has normal function. The left ventricle has no regional wall motion abnormalities. Left ventricular diastolic parameters are consistent with Grade I diastolic dysfunction (impaired relaxation).  3. Right ventricular systolic function is normal. The right ventricular size is normal.  4. The mitral valve is normal in structure. Trivial mitral valve regurgitation. No evidence of mitral  stenosis.  5. The aortic valve is tricuspid. Aortic valve regurgitation is mild. Mild aortic valve sclerosis is present, with no evidence of aortic valve stenosis.  6. The inferior vena cava is normal in size with greater than 50% respiratory variability, suggesting right atrial pressure of 3 mmHg.     03/27/2020 Imaging   1. Status post hysterectomy. No evidence of recurrent or metastatic disease in the abdomen or pelvis. 2. Multiple small pulmonary nodules in the lung bases are unchanged to the extent that they are included on current examination and remain most likely sequelae of prior infection or inflammation. Attention on follow-up. 3. Aortic Atherosclerosis (ICD10-I70.0).   04/17/2020 Echocardiogram   1. Normal LVEF, normal and unchanged GLS: -20.2%.  2. Left ventricular ejection fraction, by estimation, is 60 to 65%. The left ventricle has normal function. The left ventricle has no regional wall motion abnormalities. There is mild concentric left ventricular hypertrophy. Left ventricular diastolic parameters are consistent with Grade I diastolic dysfunction (impaired relaxation). The average left ventricular global longitudinal strain is -20.2 %. The global longitudinal strain is normal.  3. Right ventricular systolic function is normal. The right ventricular size is normal.  4. The mitral valve is normal in structure. Mild mitral valve regurgitation. No evidence of mitral stenosis.  5. The aortic valve is normal in structure. There is mild calcification of the aortic valve. There is mild thickening of the aortic valve. Aortic valve regurgitation is mild. No aortic stenosis is present.  6. The inferior vena cava is normal in size with greater than 50% respiratory variability, suggesting right atrial pressure of 3 mmHg.   08/02/2020 Echocardiogram   1. Left ventricular ejection fraction, by estimation, is 55 to 60%. The left ventricle  has normal function. The left ventricle has no regional  wall motion abnormalities. Left ventricular diastolic parameters are consistent with Grade I diastolic dysfunction (impaired relaxation). The average left ventricular global longitudinal strain is -27.0 %. The global longitudinal strain is normal.  2. Right ventricular systolic function is normal. The right ventricular size is normal. Tricuspid regurgitation signal is inadequate for assessing PA pressure.  3. The mitral valve is grossly normal. Trivial mitral valve regurgitation. No evidence of mitral stenosis.  4. The aortic valve is tricuspid. Aortic valve regurgitation is mild. No aortic stenosis is present.  5. The inferior vena cava is normal in size with greater than 50% respiratory variability, suggesting right atrial pressure of 3 mmHg.   09/19/2020 Imaging   1. Status post hysterectomy. No evidence of recurrent or new metastatic disease within the chest, abdomen, or pelvis. 2. Scattered bilateral tiny pulmonary nodules, unchanged from prior studies and most likely sequela of prior infection or inflammation, recommend attention on follow-up imaging. No new suspicious pulmonary nodules or masses. 3. Subcutaneous edema and small hematoma overlying the posterior gluteal musculature, recommend correlation with recent history of trauma. 4. Mild low-density wall thickening of the gastric antrum, which may represent gastritis. 5. Hepatic steatosis. 6. Extensive sigmoid colonic diverticulosis without findings of acute diverticulitis. 7. Aortic atherosclerosis.  Aortic Atherosclerosis (ICD10-I70.0).   Metastasis to lymph nodes ([place])  10/14/2018 Initial Diagnosis   Metastasis to lymph nodes ([place])   10/21/2018 - 02/24/2019 Chemotherapy   The patient had palonosetron (ALOXI) injection 0.25 mg, 0.25 mg, Intravenous,  Once, 7 of 7 cycles Administration: 0.25 mg (10/21/2018), 0.25 mg (11/11/2018), 0.25 mg (12/02/2018), 0.25 mg (12/23/2018), 0.25 mg (01/13/2019), 0.25 mg (02/03/2019), 0.25 mg (02/24/2019) CARBOplatin  (PARAPLATIN) 310 mg in sodium chloride 0.9 % 250 mL chemo infusion, 310 mg, Intravenous,  Once, 7 of 7 cycles Dose modification: 300 mg (original dose 311.5 mg, Cycle 4, Reason: Dose not tolerated) Administration: 310 mg (10/21/2018), 300 mg (11/11/2018), 300 mg (12/02/2018), 300 mg (12/23/2018), 300 mg (01/13/2019), 300 mg (02/03/2019), 300 mg (02/24/2019) PACLitaxel (TAXOL) 222 mg in sodium chloride 0.9 % 250 mL chemo infusion (> $RemoveBef'80mg'DSsykVyMbb$ /m2), 135 mg/m2 = 222 mg, Intravenous,  Once, 7 of 7 cycles Administration: 222 mg (10/21/2018), 222 mg (11/11/2018), 222 mg (12/02/2018), 222 mg (12/23/2018), 222 mg (01/13/2019), 222 mg (02/03/2019), 222 mg (02/24/2019) fosaprepitant (EMEND) 150 mg, dexamethasone (DECADRON) 12 mg in sodium chloride 0.9 % 145 mL IVPB, , Intravenous,  Once, 7 of 7 cycles Administration:  (10/21/2018),  (11/11/2018),  (12/02/2018),  (12/23/2018),  (01/13/2019),  (02/03/2019),  (02/24/2019)  for chemotherapy treatment.      REVIEW OF SYSTEMS:   Constitutional: Denies fevers, chills or abnormal weight loss Eyes: Denies blurriness of vision Ears, nose, mouth, throat, and face: Denies mucositis or sore throat Respiratory: Denies cough, dyspnea or wheezes Cardiovascular: Denies palpitation, chest discomfort or lower extremity swelling Gastrointestinal:  Denies nausea, heartburn or change in bowel habits Skin: Denies abnormal skin rashes Lymphatics: Denies new lymphadenopathy or easy bruising Neurological:Denies numbness, tingling or new weaknesses Behavioral/Psych: Mood is stable, no new changes  All other systems were reviewed with the patient and are negative.  I have reviewed the past medical history, past surgical history, social history and family history with the patient and they are unchanged from previous note.  ALLERGIES:  has No Known Allergies.  MEDICATIONS:  Current Outpatient Medications  Medication Sig Dispense Refill  . acetaminophen (TYLENOL) 500 MG tablet Take 1,000 mg by mouth every 6  (six)  hours as needed.    Marland Kitchen aspirin EC 81 MG tablet Take 81 mg by mouth daily.    Marland Kitchen atorvastatin (LIPITOR) 20 MG tablet Take 20 mg by mouth every evening.     . Calcium Carbonate-Vitamin D (CALCIUM-VITAMIN D) 500-200 MG-UNIT per tablet Take 1 tablet by mouth daily.    . citalopram (CELEXA) 20 MG tablet Take 20 mg by mouth at bedtime.     Marland Kitchen estradiol (ESTRACE VAGINAL) 0.1 MG/GM vaginal cream Place 1 Applicatorful vaginally 3 (three) times a week. 42.5 g 12  . gabapentin (NEURONTIN) 600 MG tablet Take 600 mg by mouth at bedtime as needed.    Marland Kitchen levothyroxine (SYNTHROID, LEVOTHROID) 100 MCG tablet Take 100 mcg by mouth daily before breakfast.     . lidocaine-prilocaine (EMLA) cream Apply to affected area once 30 g 3  . losartan-hydrochlorothiazide (HYZAAR) 100-25 MG tablet     . magnesium oxide (MAG-OX) 400 (241.3 Mg) MG tablet Take 1 tablet (400 mg total) by mouth daily. 30 tablet 11  . metoprolol succinate (TOPROL-XL) 25 MG 24 hr tablet Take 25 mg by mouth daily.    . Multiple Vitamin (MULTIVITAMIN WITH MINERALS) TABS Take 1 tablet by mouth daily.    . ondansetron (ZOFRAN) 8 MG tablet Take 1 tablet (8 mg total) by mouth every 8 (eight) hours as needed. Start on the third day after chemotherapy. 30 tablet 1  . pantoprazole (PROTONIX) 40 MG tablet Take 40 mg by mouth every morning.  (Patient not taking: Reported on 02/15/2020)    . valACYclovir (VALTREX) 1000 MG tablet valacyclovir 1 gram tablet     No current facility-administered medications for this visit.   Facility-Administered Medications Ordered in Other Visits  Medication Dose Route Frequency Roselin Wiemann Last Rate Last Admin  . acetaminophen (TYLENOL) tablet 650 mg  650 mg Oral Once Alvy Bimler, Ni, MD      . diphenhydrAMINE (BENADRYL) tablet 25 mg  25 mg Oral Once Alvy Bimler, Ni, MD      . trastuzumab-dkst (OGIVRI) 357 mg in sodium chloride 0.9 % 250 mL chemo infusion  6 mg/kg (Treatment Plan Recorded) Intravenous Once Vicki Lark, MD         PHYSICAL EXAMINATION: ECOG PERFORMANCE STATUS: 1 - Symptomatic but completely ambulatory  Vitals:   09/19/20 1239  BP: (!) 163/66  Pulse: 74  Resp: 18  Temp: 97.9 F (36.6 C)  SpO2: 97%   Filed Weights   09/19/20 1239  Weight: 136 lb 12.8 oz (62.1 kg)    GENERAL:alert, no distress and comfortable Musculoskeletal:no cyanosis of digits and no clubbing  NEURO: alert & oriented x 3 with fluent speech, no focal motor/sensory deficits  LABORATORY DATA:  I have reviewed the data as listed    Component Value Date/Time   NA 134 (L) 09/18/2020 1446   NA 139 02/13/2017 1512   K 3.9 09/18/2020 1446   K 4.2 02/13/2017 1512   CL 97 (L) 09/18/2020 1446   CO2 28 09/18/2020 1446   CO2 28 02/13/2017 1512   GLUCOSE 113 (H) 09/18/2020 1446   GLUCOSE 118 02/13/2017 1512   BUN 24 (H) 09/18/2020 1446   BUN 25.2 02/13/2017 1512   CREATININE 1.33 (H) 09/18/2020 1446   CREATININE 1.37 (H) 12/21/2019 0850   CREATININE 1.2 (H) 02/13/2017 1512   CALCIUM 9.2 09/18/2020 1446   CALCIUM 10.0 02/13/2017 1512   PROT 7.2 09/18/2020 1446   ALBUMIN 4.0 09/18/2020 1446   AST 19 09/18/2020 1446   AST 19 12/21/2019  0850   ALT 13 09/18/2020 1446   ALT 13 12/21/2019 0850   ALKPHOS 70 09/18/2020 1446   BILITOT 0.5 09/18/2020 1446   BILITOT 0.4 12/21/2019 0850   GFRNONAA 40 (L) 09/18/2020 1446   GFRNONAA 37 (L) 12/21/2019 0850   GFRAA 47 (L) 03/28/2020 0933   GFRAA 42 (L) 12/21/2019 0850    No results found for: SPEP, UPEP  Lab Results  Component Value Date   WBC 7.8 09/18/2020   NEUTROABS 5.4 09/18/2020   HGB 11.2 (L) 09/18/2020   HCT 32.1 (L) 09/18/2020   MCV 90.9 09/18/2020   PLT 259 09/18/2020      Chemistry      Component Value Date/Time   NA 134 (L) 09/18/2020 1446   NA 139 02/13/2017 1512   K 3.9 09/18/2020 1446   K 4.2 02/13/2017 1512   CL 97 (L) 09/18/2020 1446   CO2 28 09/18/2020 1446   CO2 28 02/13/2017 1512   BUN 24 (H) 09/18/2020 1446   BUN 25.2 02/13/2017 1512    CREATININE 1.33 (H) 09/18/2020 1446   CREATININE 1.37 (H) 12/21/2019 0850   CREATININE 1.2 (H) 02/13/2017 1512      Component Value Date/Time   CALCIUM 9.2 09/18/2020 1446   CALCIUM 10.0 02/13/2017 1512   ALKPHOS 70 09/18/2020 1446   AST 19 09/18/2020 1446   AST 19 12/21/2019 0850   ALT 13 09/18/2020 1446   ALT 13 12/21/2019 0850   BILITOT 0.5 09/18/2020 1446   BILITOT 0.4 12/21/2019 0850       RADIOGRAPHIC STUDIES: I have reviewed multiple imaging studies with the patient I have personally reviewed the radiological images as listed and agreed with the findings in the report. CT CHEST ABDOMEN PELVIS W CONTRAST  Result Date: 09/19/2020 CLINICAL DATA:  History of endometrial cancer status post hysterectomy, chemotherapy complete immunotherapy EXAM: CT CHEST, ABDOMEN, AND PELVIS WITH CONTRAST TECHNIQUE: Multidetector CT imaging of the chest, abdomen and pelvis was performed following the standard protocol during bolus administration of intravenous contrast. CONTRAST:  41mL OMNIPAQUE IOHEXOL 300 MG/ML  SOLN COMPARISON:  Multiple priors including most recent CT March 27, 2020 and CT chest September 27, 2019 FINDINGS: CT CHEST FINDINGS Cardiovascular: Aortic atherosclerosis. No thoracic aortic aneurysm. No central. Coronary calcifications. Normal size heart. Pericardial effusion. Mediastinum/Nodes: Multiple hypoattenuating thyroid nodules measuring up to 1 cm are unchanged from prior. She does not know how you or to Not clinically significant; no follow-up imaging recommended (ref: J Am Coll Radiol. 2015 Feb;12(2): 143-50).No mediastinal, hilar or axillary lymphadenopathy. Esophagus and trachea are grossly unremarkable. Lungs/Pleura: No pleural effusion. No pneumothorax. Scattered subsegmental atelectasis. Scattered bilateral tiny pulmonary nodules are again visualized. No new suspicious pulmonary nodules or masses. For reference: -posterior right upper lobe pulmonary nodule measures 3 mm on image  52/4, unchanged -anterior right lower lobe nodule measures 5 mm on image 86/4, unchanged. -left lower lobe nodule measures 2 mm on image 78/4, stable -Posterolateral left upper lobe nodule measures 2 mm on image 72/4, unchanged. -right lower lobe lung nodule measuring 4 mm on image 111/4, unchanged. Musculoskeletal: Bilateral breast prostheses. Surgical clips in the left axilla multilevel degenerative change of the spine. No suspicious lytic or blastic lesion of bone. CT ABDOMEN PELVIS FINDINGS Hepatobiliary: Diffusely decreased echogenicity of the hepatic parenchyma. Unchanged subcentimeter hypoattenuating lesion in the central right lobe of the liver measuring 8 mm on image 48/2. Gallbladder is unremarkable. No biliary ductal dilation. Pancreas: Unremarkable Spleen: Unremarkable Adrenals/Urinary Tract: Bilateral adrenal glands  are unremarkable. No hydronephrosis. No suspicious renal masses. No filling defect visualized within the opacified portions of the collecting systems and ureters on delayed imaging. Urinary bladder is grossly unremarkable for degree of distension. Stomach/Bowel: Stomach is predominantly decompressed. There is mild low-density wall thickening of the gastric antrum. Normal positioning of the duodenum/ligament of [place]. No suspicious small bowel wall thickening or dilation. Extensive sigmoid colonic diverticulosis without findings of acute diverticulitis. Vascular/Lymphatic: Aortic atherosclerosis. No enlarged abdominal or pelvic lymph nodes. Reproductive: Status post hysterectomy. No adnexal masses. Other: No abdominopelvic ascites. Subcutaneous edema and small hematoma overlying the posterior gluteal musculature on image 84/2. Musculoskeletal: Multilevel degenerative change of the spine. No acute osseous abnormality. No suspicious lytic or blastic lesion of bone. IMPRESSION: 1. Status post hysterectomy. No evidence of recurrent or new metastatic disease within the chest, abdomen, or pelvis.  2. Scattered bilateral tiny pulmonary nodules, unchanged from prior studies and most likely sequela of prior infection or inflammation, recommend attention on follow-up imaging. No new suspicious pulmonary nodules or masses. 3. Subcutaneous edema and small hematoma overlying the posterior gluteal musculature, recommend correlation with recent history of trauma. 4. Mild low-density wall thickening of the gastric antrum, which may represent gastritis. 5. Hepatic steatosis. 6. Extensive sigmoid colonic diverticulosis without findings of acute diverticulitis. 7. Aortic atherosclerosis.  Aortic Atherosclerosis (ICD10-I70.0). Electronically Signed   By: Dahlia Bailiff MD   On: 09/19/2020 08:22

## 2020-09-19 NOTE — Assessment & Plan Note (Signed)
CT imaging show no signs of cancer recurrence Observe closely for now

## 2020-09-19 NOTE — Assessment & Plan Note (Signed)
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 

## 2020-09-19 NOTE — Telephone Encounter (Signed)
Scheduled appts per 3/8 sch msg. Pt stated she would refer to mychart for AVS and appts.

## 2020-09-19 NOTE — Assessment & Plan Note (Signed)
I have reviewed multiple imaging studies with the patient She has no signs of cancer recurrence I recommend continued imaging study every 6 months, next would be due around September She will be due for echocardiogram in April

## 2020-09-19 NOTE — Assessment & Plan Note (Signed)
Her lung nodules are stable She is not symptomatic Observe with serial imaging studies

## 2020-09-19 NOTE — Telephone Encounter (Signed)
Called and left a message. Ask her to call the office back if needed. Echo is scheduled for 4/18 at 10 am, arrive at 0945 for appt at [facility].

## 2020-09-19 NOTE — Assessment & Plan Note (Signed)
Renal function is stable. Monitor closely She was seen by nephrologist recently 

## 2020-09-19 NOTE — Telephone Encounter (Signed)
-----   Message from [name], MD sent at 09/19/2020  1:25 PM EST ----- Regarding: pls schedule echo for 4/18

## 2020-09-19 NOTE — Assessment & Plan Note (Signed)
We discussed the importance of lifestyle modification and dietary changes

## 2020-10-10 ENCOUNTER — Inpatient Hospital Stay: Payer: Medicare HMO

## 2020-10-10 ENCOUNTER — Other Ambulatory Visit: Payer: Self-pay

## 2020-10-10 VITALS — BP 156/63 | HR 69 | Temp 97.6°F | Resp 18 | Wt 135.8 lb

## 2020-10-10 DIAGNOSIS — C779 Secondary and unspecified malignant neoplasm of lymph node, unspecified: Secondary | ICD-10-CM | POA: Diagnosis not present

## 2020-10-10 DIAGNOSIS — Z79899 Other long term (current) drug therapy: Secondary | ICD-10-CM | POA: Diagnosis not present

## 2020-10-10 DIAGNOSIS — C541 Malignant neoplasm of endometrium: Secondary | ICD-10-CM

## 2020-10-10 DIAGNOSIS — D631 Anemia in chronic kidney disease: Secondary | ICD-10-CM | POA: Diagnosis not present

## 2020-10-10 DIAGNOSIS — Z7982 Long term (current) use of aspirin: Secondary | ICD-10-CM | POA: Diagnosis not present

## 2020-10-10 DIAGNOSIS — Z5112 Encounter for antineoplastic immunotherapy: Secondary | ICD-10-CM | POA: Diagnosis not present

## 2020-10-10 DIAGNOSIS — R918 Other nonspecific abnormal finding of lung field: Secondary | ICD-10-CM | POA: Diagnosis not present

## 2020-10-10 DIAGNOSIS — K760 Fatty (change of) liver, not elsewhere classified: Secondary | ICD-10-CM | POA: Diagnosis not present

## 2020-10-10 DIAGNOSIS — N183 Chronic kidney disease, stage 3 unspecified: Secondary | ICD-10-CM | POA: Diagnosis not present

## 2020-10-10 DIAGNOSIS — Z853 Personal history of malignant neoplasm of breast: Secondary | ICD-10-CM | POA: Diagnosis not present

## 2020-10-10 LAB — CBC WITH DIFFERENTIAL/PLATELET
Abs Immature Granulocytes: 0.02 10*3/uL (ref 0.00–0.07)
Basophils Absolute: 0.1 10*3/uL (ref 0.0–0.1)
Basophils Relative: 1 %
Eosinophils Absolute: 0.2 10*3/uL (ref 0.0–0.5)
Eosinophils Relative: 3 %
HCT: 30.7 % — ABNORMAL LOW (ref 36.0–46.0)
Hemoglobin: 10.7 g/dL — ABNORMAL LOW (ref 12.0–15.0)
Immature Granulocytes: 0 %
Lymphocytes Relative: 20 %
Lymphs Abs: 1.3 10*3/uL (ref 0.7–4.0)
MCH: 31.8 pg (ref 26.0–34.0)
MCHC: 34.9 g/dL (ref 30.0–36.0)
MCV: 91.1 fL (ref 80.0–100.0)
Monocytes Absolute: 0.6 10*3/uL (ref 0.1–1.0)
Monocytes Relative: 9 %
Neutro Abs: 4.3 10*3/uL (ref 1.7–7.7)
Neutrophils Relative %: 67 %
Platelets: 223 10*3/uL (ref 150–400)
RBC: 3.37 MIL/uL — ABNORMAL LOW (ref 3.87–5.11)
RDW: 12 % (ref 11.5–15.5)
WBC: 6.5 10*3/uL (ref 4.0–10.5)
nRBC: 0 % (ref 0.0–0.2)

## 2020-10-10 LAB — COMPREHENSIVE METABOLIC PANEL
ALT: 13 U/L (ref 0–44)
AST: 20 U/L (ref 15–41)
Albumin: 4 g/dL (ref 3.5–5.0)
Alkaline Phosphatase: 71 U/L (ref 38–126)
Anion gap: 11 (ref 5–15)
BUN: 23 mg/dL (ref 8–23)
CO2: 25 mmol/L (ref 22–32)
Calcium: 8.9 mg/dL (ref 8.9–10.3)
Chloride: 103 mmol/L (ref 98–111)
Creatinine, Ser: 1.27 mg/dL — ABNORMAL HIGH (ref 0.44–1.00)
GFR, Estimated: 43 mL/min — ABNORMAL LOW (ref >60)
Glucose, Bld: 103 mg/dL — ABNORMAL HIGH (ref 70–99)
Potassium: 4.1 mmol/L (ref 3.5–5.1)
Sodium: 139 mmol/L (ref 135–145)
Total Bilirubin: 0.4 mg/dL (ref 0.3–1.2)
Total Protein: 6.9 g/dL (ref 6.5–8.1)

## 2020-10-10 MED ORDER — ACETAMINOPHEN 325 MG PO TABS
650.0000 mg | ORAL_TABLET | Freq: Once | ORAL | Status: AC
  Administered 2020-10-10: 650 mg via ORAL

## 2020-10-10 MED ORDER — ACETAMINOPHEN 325 MG PO TABS
ORAL_TABLET | ORAL | Status: AC
  Filled 2020-10-10: qty 2

## 2020-10-10 MED ORDER — DIPHENHYDRAMINE HCL 25 MG PO CAPS
ORAL_CAPSULE | ORAL | Status: AC
  Filled 2020-10-10: qty 1

## 2020-10-10 MED ORDER — SODIUM CHLORIDE 0.9% FLUSH
10.0000 mL | INTRAVENOUS | Status: DC | PRN
  Administered 2020-10-10: 10 mL
  Filled 2020-10-10: qty 10

## 2020-10-10 MED ORDER — HEPARIN SOD (PORK) LOCK FLUSH 100 UNIT/ML IV SOLN
500.0000 [IU] | Freq: Once | INTRAVENOUS | Status: AC | PRN
  Administered 2020-10-10: 500 [IU]
  Filled 2020-10-10: qty 5

## 2020-10-10 MED ORDER — SODIUM CHLORIDE 0.9 % IV SOLN
Freq: Once | INTRAVENOUS | Status: AC
  Filled 2020-10-10: qty 250

## 2020-10-10 MED ORDER — TRASTUZUMAB-DKST CHEMO 150 MG IV SOLR
6.0000 mg/kg | Freq: Once | INTRAVENOUS | Status: AC
  Administered 2020-10-10: 357 mg via INTRAVENOUS
  Filled 2020-10-10: qty 17

## 2020-10-10 MED ORDER — DIPHENHYDRAMINE HCL 25 MG PO TABS
25.0000 mg | ORAL_TABLET | Freq: Once | ORAL | Status: AC
  Administered 2020-10-10: 25 mg via ORAL
  Filled 2020-10-10: qty 1

## 2020-10-10 MED ORDER — SODIUM CHLORIDE 0.9% FLUSH
10.0000 mL | Freq: Once | INTRAVENOUS | Status: AC
  Administered 2020-10-10: 10 mL
  Filled 2020-10-10: qty 10

## 2020-10-10 NOTE — Patient Instructions (Signed)
Implanted Port Insertion, Care After This sheet gives you information about how to care for yourself after your procedure. Your health care provider may also give you more specific instructions. If you have problems or questions, contact your health care provider. What can I expect after the procedure? After the procedure, it is common to have:  Discomfort at the port insertion site.  Bruising on the skin over the port. This should improve over 3-4 days. Follow these instructions at home: Port care  After your port is placed, you will get a manufacturer's information card. The card has information about your port. Keep this card with you at all times.  Take care of the port as told by your health care provider. Ask your health care provider if you or a family member can get training for taking care of the port at home. A home health care nurse may also take care of the port.  Make sure to remember what type of port you have. Incision care  Follow instructions from your health care provider about how to take care of your port insertion site. Make sure you: ? Wash your hands with soap and water before and after you change your bandage (dressing). If soap and water are not available, use hand sanitizer. ? Change your dressing as told by your health care provider. ? Leave stitches (sutures), skin glue, or adhesive strips in place. These skin closures may need to stay in place for 2 weeks or longer. If adhesive strip edges start to loosen and curl up, you may trim the loose edges. Do not remove adhesive strips completely unless your health care provider tells you to do that.  Check your port insertion site every day for signs of infection. Check for: ? Redness, swelling, or pain. ? Fluid or blood. ? Warmth. ? Pus or a bad smell.      Activity  Return to your normal activities as told by your health care provider. Ask your health care provider what activities are safe for you.  Do not  lift anything that is heavier than 10 lb (4.5 kg), or the limit that you are told, until your health care provider says that it is safe. General instructions  Take over-the-counter and prescription medicines only as told by your health care provider.  Do not take baths, swim, or use a hot tub until your health care provider approves. Ask your health care provider if you may take showers. You may only be allowed to take sponge baths.  Do not drive for 24 hours if you were given a sedative during your procedure.  Wear a medical alert bracelet in case of an emergency. This will tell any health care providers that you have a port.  Keep all follow-up visits as told by your health care provider. This is important. Contact a health care provider if:  You cannot flush your port with saline as directed, or you cannot draw blood from the port.  You have a fever or chills.  You have redness, swelling, or pain around your port insertion site.  You have fluid or blood coming from your port insertion site.  Your port insertion site feels warm to the touch.  You have pus or a bad smell coming from the port insertion site. Get help right away if:  You have chest pain or shortness of breath.  You have bleeding from your port that you cannot control. Summary  Take care of the port as told by your   health care provider. Keep the manufacturer's information card with you at all times.  Change your dressing as told by your health care provider.  Contact a health care provider if you have a fever or chills or if you have redness, swelling, or pain around your port insertion site.  Keep all follow-up visits as told by your health care provider. This information is not intended to replace advice given to you by your health care provider. Make sure you discuss any questions you have with your health care provider. Document Revised: 01/27/2018 Document Reviewed: 01/27/2018 Elsevier Patient Education   2021 [employer] Inc.  

## 2020-10-10 NOTE — Patient Instructions (Signed)
[facility] Discharge Instructions for Patients Receiving Chemotherapy  Today you received the following chemotherapy agents trastuzumab.  To help prevent nausea and vomiting after your treatment, we encourage you to take your nausea medication as directed.    If you develop nausea and vomiting that is not controlled by your nausea medication, call the clinic.   BELOW ARE SYMPTOMS THAT SHOULD BE REPORTED IMMEDIATELY:  *FEVER GREATER THAN 100.5 F  *CHILLS WITH OR WITHOUT FEVER  NAUSEA AND VOMITING THAT IS NOT CONTROLLED WITH YOUR NAUSEA MEDICATION  *UNUSUAL SHORTNESS OF BREATH  *UNUSUAL BRUISING OR BLEEDING  TENDERNESS IN MOUTH AND THROAT WITH OR WITHOUT PRESENCE OF ULCERS  *URINARY PROBLEMS  *BOWEL PROBLEMS  UNUSUAL RASH Items with * indicate a potential emergency and should be followed up as soon as possible.  Feel free to call the clinic should you have any questions or concerns. The clinic phone number is [phone].  Please show the CHEMO ALERT CARD at check-in to the Emergency Department and triage nurse.   

## 2020-10-11 DIAGNOSIS — Z85828 Personal history of other malignant neoplasm of skin: Secondary | ICD-10-CM | POA: Diagnosis not present

## 2020-10-11 DIAGNOSIS — L570 Actinic keratosis: Secondary | ICD-10-CM | POA: Diagnosis not present

## 2020-10-13 DIAGNOSIS — M85852 Other specified disorders of bone density and structure, left thigh: Secondary | ICD-10-CM | POA: Diagnosis not present

## 2020-10-18 DIAGNOSIS — J309 Allergic rhinitis, unspecified: Secondary | ICD-10-CM | POA: Diagnosis not present

## 2020-10-18 DIAGNOSIS — Z0000 Encounter for general adult medical examination without abnormal findings: Secondary | ICD-10-CM | POA: Diagnosis not present

## 2020-10-18 DIAGNOSIS — M8580 Other specified disorders of bone density and structure, unspecified site: Secondary | ICD-10-CM | POA: Diagnosis not present

## 2020-10-18 DIAGNOSIS — K219 Gastro-esophageal reflux disease without esophagitis: Secondary | ICD-10-CM | POA: Diagnosis not present

## 2020-10-18 DIAGNOSIS — B009 Herpesviral infection, unspecified: Secondary | ICD-10-CM | POA: Diagnosis not present

## 2020-10-18 DIAGNOSIS — I10 Essential (primary) hypertension: Secondary | ICD-10-CM | POA: Diagnosis not present

## 2020-10-20 ENCOUNTER — Telehealth: Payer: Self-pay

## 2020-10-20 NOTE — Telephone Encounter (Signed)
Returned call regarding moving Echo appt to after treatment on 4/19. Okayed with Dr. Alvy Bimler. Moved appt to 4/22, she has a scheduling conflict and will call herself and reschedule.

## 2020-10-24 ENCOUNTER — Other Ambulatory Visit: Payer: Self-pay | Admitting: Hematology and Oncology

## 2020-10-30 ENCOUNTER — Other Ambulatory Visit (HOSPITAL_COMMUNITY): Payer: Medicare HMO

## 2020-10-31 ENCOUNTER — Inpatient Hospital Stay: Payer: Medicare HMO | Admitting: Hematology and Oncology

## 2020-10-31 ENCOUNTER — Encounter: Payer: Self-pay | Admitting: Hematology and Oncology

## 2020-10-31 ENCOUNTER — Inpatient Hospital Stay: Payer: Medicare HMO | Attending: Gynecologic Oncology

## 2020-10-31 ENCOUNTER — Inpatient Hospital Stay: Payer: Medicare HMO

## 2020-10-31 ENCOUNTER — Other Ambulatory Visit: Payer: Self-pay

## 2020-10-31 VITALS — BP 173/65

## 2020-10-31 DIAGNOSIS — D631 Anemia in chronic kidney disease: Secondary | ICD-10-CM | POA: Insufficient documentation

## 2020-10-31 DIAGNOSIS — C541 Malignant neoplasm of endometrium: Secondary | ICD-10-CM | POA: Diagnosis not present

## 2020-10-31 DIAGNOSIS — C779 Secondary and unspecified malignant neoplasm of lymph node, unspecified: Secondary | ICD-10-CM | POA: Diagnosis present

## 2020-10-31 DIAGNOSIS — E042 Nontoxic multinodular goiter: Secondary | ICD-10-CM | POA: Insufficient documentation

## 2020-10-31 DIAGNOSIS — M8580 Other specified disorders of bone density and structure, unspecified site: Secondary | ICD-10-CM | POA: Diagnosis not present

## 2020-10-31 DIAGNOSIS — Z5112 Encounter for antineoplastic immunotherapy: Secondary | ICD-10-CM | POA: Insufficient documentation

## 2020-10-31 DIAGNOSIS — Z7982 Long term (current) use of aspirin: Secondary | ICD-10-CM | POA: Insufficient documentation

## 2020-10-31 DIAGNOSIS — N183 Chronic kidney disease, stage 3 unspecified: Secondary | ICD-10-CM

## 2020-10-31 DIAGNOSIS — Z170 Estrogen receptor positive status [ER+]: Secondary | ICD-10-CM | POA: Insufficient documentation

## 2020-10-31 DIAGNOSIS — Z79899 Other long term (current) drug therapy: Secondary | ICD-10-CM | POA: Diagnosis not present

## 2020-10-31 DIAGNOSIS — D630 Anemia in neoplastic disease: Secondary | ICD-10-CM

## 2020-10-31 LAB — COMPREHENSIVE METABOLIC PANEL
ALT: 13 U/L (ref 0–44)
AST: 21 U/L (ref 15–41)
Albumin: 3.9 g/dL (ref 3.5–5.0)
Alkaline Phosphatase: 70 U/L (ref 38–126)
Anion gap: 13 (ref 5–15)
BUN: 22 mg/dL (ref 8–23)
CO2: 26 mmol/L (ref 22–32)
Calcium: 9 mg/dL (ref 8.9–10.3)
Chloride: 99 mmol/L (ref 98–111)
Creatinine, Ser: 1.27 mg/dL — ABNORMAL HIGH (ref 0.44–1.00)
GFR, Estimated: 43 mL/min — ABNORMAL LOW (ref >60)
Glucose, Bld: 94 mg/dL (ref 70–99)
Potassium: 3.9 mmol/L (ref 3.5–5.1)
Sodium: 138 mmol/L (ref 135–145)
Total Bilirubin: 0.5 mg/dL (ref 0.3–1.2)
Total Protein: 6.8 g/dL (ref 6.5–8.1)

## 2020-10-31 LAB — CBC WITH DIFFERENTIAL/PLATELET
Abs Immature Granulocytes: 0.03 10*3/uL (ref 0.00–0.07)
Basophils Absolute: 0.1 10*3/uL (ref 0.0–0.1)
Basophils Relative: 1 %
Eosinophils Absolute: 0.3 10*3/uL (ref 0.0–0.5)
Eosinophils Relative: 3 %
HCT: 30.3 % — ABNORMAL LOW (ref 36.0–46.0)
Hemoglobin: 10.7 g/dL — ABNORMAL LOW (ref 12.0–15.0)
Immature Granulocytes: 0 %
Lymphocytes Relative: 16 %
Lymphs Abs: 1.2 10*3/uL (ref 0.7–4.0)
MCH: 31.8 pg (ref 26.0–34.0)
MCHC: 35.3 g/dL (ref 30.0–36.0)
MCV: 90.2 fL (ref 80.0–100.0)
Monocytes Absolute: 0.6 10*3/uL (ref 0.1–1.0)
Monocytes Relative: 8 %
Neutro Abs: 5.3 10*3/uL (ref 1.7–7.7)
Neutrophils Relative %: 72 %
Platelets: 215 10*3/uL (ref 150–400)
RBC: 3.36 MIL/uL — ABNORMAL LOW (ref 3.87–5.11)
RDW: 11.8 % (ref 11.5–15.5)
WBC: 7.4 10*3/uL (ref 4.0–10.5)
nRBC: 0 % (ref 0.0–0.2)

## 2020-10-31 MED ORDER — ACETAMINOPHEN 325 MG PO TABS
ORAL_TABLET | ORAL | Status: AC
  Filled 2020-10-31: qty 2

## 2020-10-31 MED ORDER — TRASTUZUMAB-ANNS CHEMO 150 MG IV SOLR
6.0000 mg/kg | Freq: Once | INTRAVENOUS | Status: AC
  Administered 2020-10-31: 357 mg via INTRAVENOUS
  Filled 2020-10-31: qty 17

## 2020-10-31 MED ORDER — DIPHENHYDRAMINE HCL 25 MG PO TABS
25.0000 mg | ORAL_TABLET | Freq: Once | ORAL | Status: AC
  Administered 2020-10-31: 25 mg via ORAL
  Filled 2020-10-31: qty 1

## 2020-10-31 MED ORDER — SODIUM CHLORIDE 0.9% FLUSH
10.0000 mL | INTRAVENOUS | Status: DC | PRN
  Administered 2020-10-31: 10 mL
  Filled 2020-10-31: qty 10

## 2020-10-31 MED ORDER — HEPARIN SOD (PORK) LOCK FLUSH 100 UNIT/ML IV SOLN
500.0000 [IU] | Freq: Once | INTRAVENOUS | Status: AC | PRN
  Administered 2020-10-31: 500 [IU]
  Filled 2020-10-31: qty 5

## 2020-10-31 MED ORDER — SODIUM CHLORIDE 0.9% FLUSH
10.0000 mL | Freq: Once | INTRAVENOUS | Status: AC
  Administered 2020-10-31: 10 mL
  Filled 2020-10-31: qty 10

## 2020-10-31 MED ORDER — DIPHENHYDRAMINE HCL 25 MG PO CAPS
ORAL_CAPSULE | ORAL | Status: AC
  Filled 2020-10-31: qty 1

## 2020-10-31 MED ORDER — ACETAMINOPHEN 325 MG PO TABS
650.0000 mg | ORAL_TABLET | Freq: Once | ORAL | Status: AC
  Administered 2020-10-31: 650 mg via ORAL

## 2020-10-31 MED ORDER — SODIUM CHLORIDE 0.9 % IV SOLN
Freq: Once | INTRAVENOUS | Status: AC
  Filled 2020-10-31: qty 250

## 2020-10-31 NOTE — Patient Instructions (Signed)
Implanted Port Insertion, Care After This sheet gives you information about how to care for yourself after your procedure. Your health care provider may also give you more specific instructions. If you have problems or questions, contact your health care provider. What can I expect after the procedure? After the procedure, it is common to have:  Discomfort at the port insertion site.  Bruising on the skin over the port. This should improve over 3-4 days. Follow these instructions at home: Port care  After your port is placed, you will get a manufacturer's information card. The card has information about your port. Keep this card with you at all times.  Take care of the port as told by your health care provider. Ask your health care provider if you or a family member can get training for taking care of the port at home. A home health care nurse may also take care of the port.  Make sure to remember what type of port you have. Incision care  Follow instructions from your health care provider about how to take care of your port insertion site. Make sure you: ? Wash your hands with soap and water before and after you change your bandage (dressing). If soap and water are not available, use hand sanitizer. ? Change your dressing as told by your health care provider. ? Leave stitches (sutures), skin glue, or adhesive strips in place. These skin closures may need to stay in place for 2 weeks or longer. If adhesive strip edges start to loosen and curl up, you may trim the loose edges. Do not remove adhesive strips completely unless your health care provider tells you to do that.  Check your port insertion site every day for signs of infection. Check for: ? Redness, swelling, or pain. ? Fluid or blood. ? Warmth. ? Pus or a bad smell.      Activity  Return to your normal activities as told by your health care provider. Ask your health care provider what activities are safe for you.  Do not  lift anything that is heavier than 10 lb (4.5 kg), or the limit that you are told, until your health care provider says that it is safe. General instructions  Take over-the-counter and prescription medicines only as told by your health care provider.  Do not take baths, swim, or use a hot tub until your health care provider approves. Ask your health care provider if you may take showers. You may only be allowed to take sponge baths.  Do not drive for 24 hours if you were given a sedative during your procedure.  Wear a medical alert bracelet in case of an emergency. This will tell any health care providers that you have a port.  Keep all follow-up visits as told by your health care provider. This is important. Contact a health care provider if:  You cannot flush your port with saline as directed, or you cannot draw blood from the port.  You have a fever or chills.  You have redness, swelling, or pain around your port insertion site.  You have fluid or blood coming from your port insertion site.  Your port insertion site feels warm to the touch.  You have pus or a bad smell coming from the port insertion site. Get help right away if:  You have chest pain or shortness of breath.  You have bleeding from your port that you cannot control. Summary  Take care of the port as told by your   health care provider. Keep the manufacturer's information card with you at all times.  Change your dressing as told by your health care provider.  Contact a health care provider if you have a fever or chills or if you have redness, swelling, or pain around your port insertion site.  Keep all follow-up visits as told by your health care provider. This information is not intended to replace advice given to you by your health care provider. Make sure you discuss any questions you have with your health care provider. Document Revised: 01/27/2018 Document Reviewed: 01/27/2018 Elsevier Patient Education   2021 [employer] Inc.  

## 2020-10-31 NOTE — Assessment & Plan Note (Signed)
Renal function is stable. Monitor closely She was seen by nephrologist recently 

## 2020-10-31 NOTE — Assessment & Plan Note (Signed)
She tolerated single agent trastuzumab well I explained to the patient the rationale behind changing to different brand She has no signs of cancer recurrence I recommend continued imaging study every 6 months, next would be due around September

## 2020-10-31 NOTE — Assessment & Plan Note (Signed)
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 

## 2020-10-31 NOTE — Assessment & Plan Note (Signed)
She was diagnosed with osteopenia I recommend maximizing vitamin D intake and weightbearing exercise We discussed the risk and benefits of restarting Evista but for now, she will try to do it in a conservative manner

## 2020-10-31 NOTE — Patient Instructions (Signed)
[facility] Discharge Instructions for Patients Receiving Chemotherapy  Today you received the following immunotherapy agent: Trastuzumab Calla Kicks)  To help prevent nausea and vomiting after your treatment, we encourage you to take your nausea medication as directed by your MD.   If you develop nausea and vomiting that is not controlled by your nausea medication, call the clinic.   BELOW ARE SYMPTOMS THAT SHOULD BE REPORTED IMMEDIATELY:  *FEVER GREATER THAN 100.5 F  *CHILLS WITH OR WITHOUT FEVER  NAUSEA AND VOMITING THAT IS NOT CONTROLLED WITH YOUR NAUSEA MEDICATION  *UNUSUAL SHORTNESS OF BREATH  *UNUSUAL BRUISING OR BLEEDING  TENDERNESS IN MOUTH AND THROAT WITH OR WITHOUT PRESENCE OF ULCERS  *URINARY PROBLEMS  *BOWEL PROBLEMS  UNUSUAL RASH Items with * indicate a potential emergency and should be followed up as soon as possible.  Feel free to call the clinic should you have any questions or concerns. The clinic phone number is [phone].  Please show the [place] at check-in to the Emergency Department and triage nurse.

## 2020-10-31 NOTE — Progress Notes (Signed)
[place] OFFICE PROGRESS NOTE  Patient Care Team: Burnard Bunting, [name]  ASSESSMENT & PLAN:  Endometrial cancer [facility]) She tolerated single agent trastuzumab well I explained to the patient the rationale behind changing to different brand She has no signs of cancer recurrence I recommend continued imaging study every 6 months, next would be due around September   CKD (chronic kidney disease), stage III ([place]) Renal function is stable. Monitor closely She was seen by nephrologist recently  Anemia in neoplastic disease She has multifactorial anemia, anemia secondary to prior chemotherapy as well as chronic kidney disease Anemia is improving/stable Recent vitamin B12 level was adequate She is not symptomatic Observe only  Osteopenia She was diagnosed with osteopenia I recommend maximizing vitamin D intake and weightbearing exercise We discussed the risk and benefits of restarting Evista but for now, she will try to do it in a conservative manner   No orders of the defined types were placed in this encounter.   All questions were answered. The patient knows to call the clinic with any problems, questions or concerns. The total time spent in the appointment was 20 minutes encounter with patients including review of chart and various tests results, discussions about plan of care and coordination of care plan   [name], MD 10/31/2020 1:31 PM  INTERVAL HISTORY: Please see below for problem oriented charting. She returns for further follow-up She has no clinical signs or symptoms of congestive heart failure No new lymphadenopathy Overall, she is doing well with excellent quality of life She had bone density done recently in [place] and was told she had osteopenia She is wondering whether she should restart Evista  SUMMARY OF ONCOLOGIC HISTORY: Oncology History Overview Note  Vicki Cervantes  has a remote history of left   breast cancer at age 66 but received BRCA testing approximately 5 years ago which was negative. Her cancer was treated with surgery but no radiation or chemotherapy.   Serous endometrial cancer, MSI stable ER positive, PR neg, Her2/neu 3+      Endometrial cancer ([place])  08/29/2016 Pathology Results   Endometrium, biopsy - HIGH GRADE ENDOMETRIAL CARCINOMA, SEE COMMENT. Microscopic Comment The sections show multiple fragments of adenocarcinoma displaying glandular and papillary patterns associated with high grade cytomorphology characterized by nuclear pleomorphism, prominent nucleoli and brisk mitosis. Immunohistochemical stains show that the tumor cells are positive for vimentin, p16, p53 with increased Ki-67 expression. Estrogen and progesterone receptor stains show patchy weak positivity. No significant positivity is seen with CEA. The findings are consistent with high grade endometrial carcinoma and the overall morphology and phenotypic features favor serous carcinoma.   08/29/2016 Initial Diagnosis   She presented with postmenopausal bleeding   10/03/2016 Imaging   CT C/A/P 09/2016 IMPRESSION: 1. Thickening of the endometrial canal up to 19 mm in fundus, presumably corresponding to the patient's reported endometrial carcinoma. 2. Multiple tiny pulmonary nodules scattered throughout the lungs bilaterally measuring 4 mm or less in size. Nodules of this size are typically considered statistically likely benign. In the setting of known primary malignancy, metastatic disease to the lungs is not excluded, but is not strongly favored on today's examination. Attention on followup studies is recommended to ensure the stability or resolution of these nodules. 3. Subcentimeter low-attenuation lesion in the central aspect of segment 8 of the liver is too small to characterize. This is statistically likely a tiny cyst, but warrants attention on follow-up studies to exclude the  possibility of a solitary  hepatic metastasis. 4. 1.5 x 1.5 x 1.7 cm well-circumscribed lesion in the proximal stomach. This is of uncertain etiology and significance, and could represent a benign gastric polyp, however, further evaluation with nonemergent endoscopy is suggested in the near future for further evaluation. 5. **An incidental finding of potential clinical significance has been found. 1.1 x 1.6 cm thyroid nodule in the inferior aspect of the right lobe of the thyroid gland. Follow-up evaluation with nonemergent thyroid ultrasound is recommended in the near future to better evaluate this finding. This recommendation follows ACR consensuss guidelines: Managing Incidental Thyroid Nodules Detected on Imaging: White Paper of the ACR Incidental Thyroid Findings Committee. J Am Coll Radiol 2015;12(2):143-150.** 6. Aortic atherosclerosis, in addition to left anterior descending coronary artery disease   10/22/2016 Surgery   Robotic assisted total hysterectomy, BSO and bilateral pelvic lymphadenectomy  Final pathology revealed a 3cm polyp containing serous carcinoma but with no myometrial invasion, no LVSI and negative nodes.  Stage IA Uterine serous cancer   10/22/2016 Pathology Results   1. Lymph nodes, regional resection, right pelvic - SIX BENIGN LYMPH NODES (0/6). 2. Lymph nodes, regional resection, left pelvic - SEVEN BENIGN LYMPH NODES (0/7). 3. Uterus +/- tubes/ovaries, neoplastic, with right ovary and fallopian tube ENDOMYOMETRIUM - SEROUS CARCINOMA ARISING WITHIN AN ENDOMETRIAL POLYP - NO MYOMETRIAL INVASION IDENTIFIED - ADENOMYOSIS - LEIOMYOMA (1 CM) - SEE ONCOLOGY TABLE AND COMMENT CERVIX - CARCINOMA FOCALLY INVOLVES ENDOCERVICAL GLANDS - NABOTHIAN CYSTS RIGHT ADNEXA - BENIGN OVARY AND FALLOPIAN TUBE - NO CARCINOMA IDENTIFIED 4. Cul-de-sac biopsy - MESOTHELIAL HYPERPLASIA Microscopic Comment 3. ONCOLOGY TABLE-UTERUS, CARCINOMA OR CARCINOSARCOMA Specimen: Uterus, right fallopian tube and  ovary Procedure: Total hysterectomy and right salpingo-oophorectomy Lymph node sampling performed: Bilateral pelvic regional resection Specimen integrity: Intact Maximum tumor size: 3 cm (polyp) Histologic type: Serous carcinoma Grade: High grade Myometrial invasion: Not identified Cervical stromal involvement: No, focal endocervical gland involvement Extent of involvement of other organs: Not identified Lymph - vascular invasion: Not identified Peritoneal washings: N/A Lymph nodes: Examined: 0 Sentinel 13 Non-sentinel 13 Total Lymph nodes with metastasis: 0 Isolated tumor cells (< 0.2 mm): 0 Micrometastasis: (> 0.2 mm and < 2.0 mm): 0 Macrometastasis: (> 2.0 mm): 0 Extracapsular extension: N/A Pelvic lymph nodes: 0 involved of 13 lymph nodes. Para-aortic lymph nodes: No para-aortic nodes submitted TNM code: pT1a, pNX FIGO Stage (based on pathologic findings, needs clinical correlation): IA Comment: Immunohistochemistry for cytokeratin AE1/AE3 is performed on all of the lymph nodes (parts 1 & 2) and no metastatic carcinoma is identified.   07/04/2017 Genetic Testing   The patient had genetic testing due to a personal history of breast and uterine cancer, and a family history of stomach cancer.  The Multi-Cancer Panel was ordered. The Multi-Cancer Panel offered by [facility] includes sequencing and/or deletion duplication testing of the following 83 genes: ALK, APC, ATM, AXIN2,BAP1,  BARD1, BLM, BMPR1A, BRCA1, BRCA2, BRIP1, CASR, CDC73, CDH1, CDK4, CDKN1B, CDKN1C, CDKN2A (p14ARF), CDKN2A (p16INK4a), CEBPA, CHEK2, CTNNA1, DICER1, DIS3L2, EGFR (c.2369C>T, p.Thr790Met variant only), EPCAM (Deletion/duplication testing only), FH, FLCN, GATA2, GPC3, GREM1 (Promoter region deletion/duplication testing only), HOXB13 (c.251G>A, p.Gly84Glu), HRAS, KIT, MAX, MEN1, MET, MITF (c.952G>A, p.Glu318Lys variant only), MLH1, MSH2, MSH3, MSH6, MUTYH, NBN, NF1, NF2, NTHL1, PALB2, PDGFRA, PHOX2B, PMS2, POLD1,  POLE, POT1, PRKAR1A, PTCH1, PTEN, RAD50, RAD51C, RAD51D, RB1, RECQL4, RET, RUNX1, SDHAF2, SDHA (sequence changes only), SDHB, SDHC, SDHD, SMAD4, SMARCA4, SMARCB1, SMARCE1, STK11, SUFU, TERC, TERT, TMEM127, TP53, TSC1, TSC2, VHL, WRN and WT1.  Results: No pathogenic variants were identified.  A variant of uncertain significance in the gene APC was identified.  c.791A>G (p.Gln264Arg).  The date of this test report is 07/04/2017.     Genetic Testing   Patient has genetic testing done for MSI. 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    12/03/2017 Imaging   CT Chest/Abd/Pelvis to follow pulmonary nodule and gastric mass IMPRESSION: 1. Stable CT of the chest. Small pulmonary nodules are unchanged when compared with previous exam. 2. No new findings identified. 3. Subcentimeter low-attenuation lesions within the liver are remain too small to characterize but are stable from prior exam. 4. Persistent indeterminate low-attenuation structure within the proximal stomach is unchanged measuring 1.4 cm. Correlation with direct visualization is advised   06/30/2018 Imaging   CT CHEST Lungs/Pleura: Stable scattered sub-cm pulmonary nodules are again seen bilaterally and are stable compared to previous studies. No new or enlarging pulmonary nodules or masses identified. No evidence of pulmonary infiltrate or pleural effusion.   08/19/2018 Echocardiogram   ECHO is done in [place]: EF 60-65%. Mild impaired relaxation. (report scanned)   09/17/2018 Relapse/Recurrence   Presented with c/o vagina discharge.  Lesion noted at the left vaginal apex 25m lesion removed Path c/w high grade serous cancer   09/17/2018 Pathology Results   Vagina, biopsy, left apex - HIGH GRADE SEROUS CARCINOMA.   09/28/2018 PET scan   1. Two hypermetabolic axial lymph nodes. Unusual site for metastatic endometrial carcinoma however the activity is more intense than typically seen in reactive adenopathy.  Suggest ultrasound-guided percutaneous biopsy of the larger RIGHT axial lymph node 2. No evidence of local recurrence at the vaginal cuff.  3. No metastatic adenopathy in the abdomen or pelvis. 4. Stable small pulmonary nodules   10/09/2018 Pathology Results   Lymph node, needle/core biopsy, right axilla - METASTATIC CARCINOMA, SEE COMMENT. Microscopic Comment The carcinoma appears high grade. Immunohistochemistry is positive for cytokeratin 7, PAX-8, and ER. Cytokeratin 20, CDX-2, PR, and GATA-3 are negative. The findings along with the history are consistent with a gynecologic primary.    10/09/2018 Procedure   Ultrasound-guided core biopsies of a right axillary lymph node.   10/14/2018 Cancer Staging   Staging form: Corpus Uteri - Carcinoma and Carcinosarcoma, AJCC 8th Edition - Clinical: Stage IVB (cT1, cN0, pM1) - Signed by GHeath Lark MD on 10/14/2018   10/19/2018 Imaging   Placement of single lumen port a cath via right internal jugular vein. The catheter tip lies at the cavo-atrial junction. A power injectable port a cath was placed and is ready for immediate use.    10/21/2018 -  Chemotherapy   The patient had carboplatin and taxol for treatment   11/11/2018 -  Chemotherapy      Patient is on Antibody Plan: BREAST OGIVRI Q21D    12/21/2018 PET scan   1. Interval resolution of hypermetabolic right axillary lymph nodes. No metabolic findings highly suspicious for recurrent metastatic disease. 2. New mild hypermetabolism within a borderline prominent portacaval lymph node, nonspecific. While a reactive node is favored, a lymph node metastasis cannot be entirely excluded. Suggest attention to this lymph node on follow-up PET-CT in 3-6 months. 3. Nonspecific new hypermetabolism at the ileocecal valve, more likely physiologic given absence of CT correlate. 4. Scattered subcentimeter pulmonary nodules are all stable and below PET resolution, more likely benign, continued CT surveillance  advised. 5. Chronic findings include: Aortic Atherosclerosis (ICD10-I70.0). Marked diffuse colonic diverticulosis. Coronary atherosclerosis.  12/28/2018 Echocardiogram   1. The left ventricle has normal systolic function with an ejection fraction of 60-65%. The cavity size was normal. Left ventricular diastolic Doppler parameters are consistent with impaired relaxation.  2. GLS recorded as -10.7 but LV appears hyperdynamic and tracking of endocardium appears poor.  3. The right ventricle has normal systolic function. The cavity was normal. There is no increase in right ventricular wall thickness.  4. Mild thickening of the mitral valve leaflet.  5. The aortic valve was not well visualized. Mild thickening of the aortic valve. Aortic valve regurgitation is trivial by color flow Doppler.   03/23/2019 Imaging   PET 1. No findings of hypermetabolic residual/recurrent or metastatic disease. 2. Similar low-level hypermetabolism within normal sized portocaval and right inguinal nodes, favored to be reactive. 3. Ongoing stability of small bilateral pulmonary nodules, favored to be benign. Below PET resolution. 4. Coronary artery atherosclerosis. Aortic Atherosclerosis   03/26/2019 Echocardiogram   1. The left ventricle has normal systolic function, with an ejection fraction of 55-60%. The cavity size was normal. Left ventricular diastolic Doppler parameters are consistent with impaired relaxation.  2. The right ventricle has normal systolic function. The cavity was normal.  3. The mitral valve is abnormal. Mild thickening of the mitral valve leaflet. There is mild mitral annular calcification present.  4. The tricuspid valve is grossly normal.  5. The aortic valve is tricuspid. Mild calcification of the aortic valve. No stenosis of the aortic valve.  6. The aorta is normal unless otherwise noted.  7. Normal LV systolic function; grade 1 diastolic dysfunction; WCB-76.2%.   06/30/2019 Imaging    Chest Impression:   1. No evidence thoracic metastasis. 2. Stable small benign-appearing pulmonary nodules   Abdomen / Pelvis Impression:   1. No evidence of metastatic disease in the abdomen pelvis. 2.  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 3.  Aortic Atherosclerosis (ICD10-I70.0).   06/30/2019 Echocardiogram    1. Left ventricular ejection fraction, by visual estimation, is 60 to 65%. The left ventricle has normal function. There is no left ventricular hypertrophy.  2. Left ventricular diastolic parameters are consistent with Grade I diastolic dysfunction (impaired relaxation).  3. The left ventricle has no regional wall motion abnormalities.  4. Global right ventricle has normal systolic function.The right ventricular size is normal. No increase in right ventricular wall thickness.  5. Left atrial size was mild-moderately dilated.  6. Right atrial size was normal.  7. The mitral valve is normal in structure. Trivial mitral valve regurgitation.  8. The tricuspid valve is normal in structure. Tricuspid valve regurgitation is trivial.  9. The aortic valve is normal in structure. Aortic valve regurgitation is trivial. No evidence of aortic valve sclerosis or stenosis. 10. The pulmonic valve was grossly normal. Pulmonic valve regurgitation is not visualized. 11. Mildly elevated pulmonary artery systolic pressure. 12. The inferior vena cava is normal in size with greater than 50% respiratory variability, suggesting right atrial pressure of 3 mmHg. 13. The average left ventricular global longitudinal strain is -12.5 %. GLS underestimated due to poor endocardial tracking.     09/27/2019 Imaging   1. Multiple small, nonspecific pulmonary nodules are again noted. The previously noted lung nodules are unchanged in size from previous exam. There is a new lung nodule within the medial right lower lobe which is nonspecific measure 4 mm. Attention at follow-up imaging is  recommended. 2. No evidence of metastatic disease within the abdomen or pelvis. 3. Coronary artery calcifications.  The  4.  Aortic Atherosclerosis (ICD10-I70.0).   09/30/2019 Echocardiogram    1. Left ventricular ejection fraction, by estimation, is 60 to 65%. The left ventricle has normal function. The left ventricle has no regional wall motion abnormalities. Left ventricular diastolic parameters are consistent with Grade I diastolic dysfunction (impaired relaxation).  2. Right ventricular systolic function is normal. The right ventricular size is normal. There is normal pulmonary artery systolic pressure. The estimated right ventricular systolic pressure is 16.1 mmHg.  3. The mitral valve is normal in structure. No evidence of mitral valve regurgitation. No evidence of mitral stenosis.  4. The aortic valve is normal in structure. Aortic valve regurgitation is trivial. No aortic stenosis is present.  5. The inferior vena cava is normal in size with greater than 50% respiratory variability, suggesting right atrial pressure of 3 mmHg.     01/07/2020 Echocardiogram    1. Normal LV function; grade 1 diastolic dysfunction; mild AI; GLS -21%.  2. Left ventricular ejection fraction, by estimation, is 55 to 60%. The left ventricle has normal function. The left ventricle has no regional wall motion abnormalities. Left ventricular diastolic parameters are consistent with Grade I diastolic dysfunction (impaired relaxation).  3. Right ventricular systolic function is normal. The right ventricular size is normal.  4. The mitral valve is normal in structure. Trivial mitral valve regurgitation. No evidence of mitral stenosis.  5. The aortic valve is tricuspid. Aortic valve regurgitation is mild. Mild aortic valve sclerosis is present, with no evidence of aortic valve stenosis.  6. The inferior vena cava is normal in size with greater than 50% respiratory variability, suggesting right atrial pressure of 3 mmHg.      03/27/2020 Imaging   1. Status post hysterectomy. No evidence of recurrent or metastatic disease in the abdomen or pelvis. 2. Multiple small pulmonary nodules in the lung bases are unchanged to the extent that they are included on current examination and remain most likely sequelae of prior infection or inflammation. Attention on follow-up. 3. Aortic Atherosclerosis (ICD10-I70.0).   04/17/2020 Echocardiogram   1. Normal LVEF, normal and unchanged GLS: -20.2%.  2. Left ventricular ejection fraction, by estimation, is 60 to 65%. The left ventricle has normal function. The left ventricle has no regional wall motion abnormalities. There is mild concentric left ventricular hypertrophy. Left ventricular diastolic parameters are consistent with Grade I diastolic dysfunction (impaired relaxation). The average left ventricular global longitudinal strain is -20.2 %. The global longitudinal strain is normal.  3. Right ventricular systolic function is normal. The right ventricular size is normal.  4. The mitral valve is normal in structure. Mild mitral valve regurgitation. No evidence of mitral stenosis.  5. The aortic valve is normal in structure. There is mild calcification of the aortic valve. There is mild thickening of the aortic valve. Aortic valve regurgitation is mild. No aortic stenosis is present.  6. The inferior vena cava is normal in size with greater than 50% respiratory variability, suggesting right atrial pressure of 3 mmHg.   08/02/2020 Echocardiogram   1. Left ventricular ejection fraction, by estimation, is 55 to 60%. The left ventricle has normal function. The left ventricle has no regional wall motion abnormalities. Left ventricular diastolic parameters are consistent with Grade I diastolic dysfunction (impaired relaxation). The average left ventricular global longitudinal strain is -27.0 %. The global longitudinal strain is normal.  2. Right ventricular systolic function is normal. The  right ventricular size is normal. Tricuspid regurgitation signal is inadequate for assessing PA pressure.  3. The mitral valve is grossly normal. Trivial mitral valve regurgitation. No evidence of mitral stenosis.  4. The aortic valve is tricuspid. Aortic valve regurgitation is mild. No aortic stenosis is present.  5. The inferior vena cava is normal in size with greater than 50% respiratory variability, suggesting right atrial pressure of 3 mmHg.   09/19/2020 Imaging   1. Status post hysterectomy. No evidence of recurrent or new metastatic disease within the chest, abdomen, or pelvis. 2. Scattered bilateral tiny pulmonary nodules, unchanged from prior studies and most likely sequela of prior infection or inflammation, recommend attention on follow-up imaging. No new suspicious pulmonary nodules or masses. 3. Subcutaneous edema and small hematoma overlying the posterior gluteal musculature, recommend correlation with recent history of trauma. 4. Mild low-density wall thickening of the gastric antrum, which may represent gastritis. 5. Hepatic steatosis. 6. Extensive sigmoid colonic diverticulosis without findings of acute diverticulitis. 7. Aortic atherosclerosis.  Aortic Atherosclerosis (ICD10-I70.0).   Metastasis to lymph nodes ([place])  10/14/2018 Initial Diagnosis   Metastasis to lymph nodes ([place])   10/21/2018 - 02/24/2019 Chemotherapy   The patient had palonosetron (ALOXI) injection 0.25 mg, 0.25 mg, Intravenous,  Once, 7 of 7 cycles Administration: 0.25 mg (10/21/2018), 0.25 mg (11/11/2018), 0.25 mg (12/02/2018), 0.25 mg (12/23/2018), 0.25 mg (01/13/2019), 0.25 mg (02/03/2019), 0.25 mg (02/24/2019) CARBOplatin (PARAPLATIN) 310 mg in sodium chloride 0.9 % 250 mL chemo infusion, 310 mg, Intravenous,  Once, 7 of 7 cycles Dose modification: 300 mg (original dose 311.5 mg, Cycle 4, Reason: Dose not tolerated) Administration: 310 mg (10/21/2018), 300 mg (11/11/2018), 300 mg (12/02/2018), 300 mg (12/23/2018), 300 mg  (01/13/2019), 300 mg (02/03/2019), 300 mg (02/24/2019) PACLitaxel (TAXOL) 222 mg in sodium chloride 0.9 % 250 mL chemo infusion (> 41m/m2), 135 mg/m2 = 222 mg, Intravenous,  Once, 7 of 7 cycles Administration: 222 mg (10/21/2018), 222 mg (11/11/2018), 222 mg (12/02/2018), 222 mg (12/23/2018), 222 mg (01/13/2019), 222 mg (02/03/2019), 222 mg (02/24/2019) fosaprepitant (EMEND) 150 mg, dexamethasone (DECADRON) 12 mg in sodium chloride 0.9 % 145 mL IVPB, , Intravenous,  Once, 7 of 7 cycles Administration:  (10/21/2018),  (11/11/2018),  (12/02/2018),  (12/23/2018),  (01/13/2019),  (02/03/2019),  (02/24/2019)  for chemotherapy treatment.      REVIEW OF SYSTEMS:   Constitutional: Denies fevers, chills or abnormal weight loss Eyes: Denies blurriness of vision Ears, nose, mouth, throat, and face: Denies mucositis or sore throat Respiratory: Denies cough, dyspnea or wheezes Cardiovascular: Denies palpitation, chest discomfort or lower extremity swelling Gastrointestinal:  Denies nausea, heartburn or change in bowel habits Skin: Denies abnormal skin rashes Lymphatics: Denies new lymphadenopathy or easy bruising Neurological:Denies numbness, tingling or new weaknesses Behavioral/Psych: Mood is stable, no new changes  All other systems were reviewed with the patient and are negative.  I have reviewed the past medical history, past surgical history, social history and family history with the patient and they are unchanged from previous note.  ALLERGIES:  has No Known Allergies.  MEDICATIONS:  Current Outpatient Medications  Medication Sig Dispense Refill  . acetaminophen (TYLENOL) 500 MG tablet Take 1,000 mg by mouth every 6 (six) hours as needed.    .Marland Kitchenaspirin EC 81 MG tablet Take 81 mg by mouth daily.    .Marland Kitchenatorvastatin (LIPITOR) 20 MG tablet Take 20 mg by mouth every evening.     . Calcium Carbonate-Vitamin D (CALCIUM-VITAMIN D) 500-200 MG-UNIT per tablet Take 1 tablet by mouth daily.    . citalopram (CELEXA) 20 MG  tablet Take 20 mg  by mouth at bedtime.     Marland Kitchen estradiol (ESTRACE VAGINAL) 0.1 MG/GM vaginal cream Place 1 Applicatorful vaginally 3 (three) times a week. 42.5 g 12  . gabapentin (NEURONTIN) 600 MG tablet Take 600 mg by mouth at bedtime as needed.    Marland Kitchen levothyroxine (SYNTHROID, LEVOTHROID) 100 MCG tablet Take 100 mcg by mouth daily before breakfast.     . lidocaine-prilocaine (EMLA) cream Apply to affected area once 30 g 3  . losartan-hydrochlorothiazide (HYZAAR) 100-25 MG tablet     . magnesium oxide (MAG-OX) 400 (241.3 Mg) MG tablet Take 1 tablet (400 mg total) by mouth daily. 30 tablet 11  . metoprolol succinate (TOPROL-XL) 25 MG 24 hr tablet Take 25 mg by mouth daily.    . Multiple Vitamin (MULTIVITAMIN WITH MINERALS) TABS Take 1 tablet by mouth daily.    . ondansetron (ZOFRAN) 8 MG tablet Take 1 tablet (8 mg total) by mouth every 8 (eight) hours as needed. Start on the third day after chemotherapy. 30 tablet 1  . pantoprazole (PROTONIX) 40 MG tablet Take 40 mg by mouth every morning.  (Patient not taking: Reported on 02/15/2020)    . valACYclovir (VALTREX) 1000 MG tablet valacyclovir 1 gram tablet     No current facility-administered medications for this visit.    PHYSICAL EXAMINATION: ECOG PERFORMANCE STATUS: 0 - Asymptomatic  Vitals:   10/31/20 1306  BP: (!) 177/66  Pulse: 65  Resp: 18  Temp: 98.1 F (36.7 C)  SpO2: 97%   Filed Weights   10/31/20 1306  Weight: 136 lb 3.2 oz (61.8 kg)    GENERAL:alert, no distress and comfortable SKIN: skin color, texture, turgor are normal, no rashes or significant lesions EYES: normal, Conjunctiva are pink and non-injected, sclera clear OROPHARYNX:no exudate, no erythema and lips, buccal mucosa, and tongue normal  NECK: supple, thyroid normal size, non-tender, without nodularity LYMPH:  no palpable lymphadenopathy in the cervical, axillary or inguinal LUNGS: clear to auscultation and percussion with normal breathing effort HEART: regular  rate & rhythm and no murmurs and no lower extremity edema ABDOMEN:abdomen soft, non-tender and normal bowel sounds Musculoskeletal:no cyanosis of digits and no clubbing  NEURO: alert & oriented x 3 with fluent speech, no focal motor/sensory deficits  LABORATORY DATA:  I have reviewed the data as listed    Component Value Date/Time   NA 138 10/31/2020 1240   NA 139 02/13/2017 1512   K 3.9 10/31/2020 1240   K 4.2 02/13/2017 1512   CL 99 10/31/2020 1240   CO2 26 10/31/2020 1240   CO2 28 02/13/2017 1512   GLUCOSE 94 10/31/2020 1240   GLUCOSE 118 02/13/2017 1512   BUN 22 10/31/2020 1240   BUN 25.2 02/13/2017 1512   CREATININE 1.27 (H) 10/31/2020 1240   CREATININE 1.37 (H) 12/21/2019 0850   CREATININE 1.2 (H) 02/13/2017 1512   CALCIUM 9.0 10/31/2020 1240   CALCIUM 10.0 02/13/2017 1512   PROT 6.8 10/31/2020 1240   ALBUMIN 3.9 10/31/2020 1240   AST 21 10/31/2020 1240   AST 19 12/21/2019 0850   ALT 13 10/31/2020 1240   ALT 13 12/21/2019 0850   ALKPHOS 70 10/31/2020 1240   BILITOT 0.5 10/31/2020 1240   BILITOT 0.4 12/21/2019 0850   GFRNONAA 43 (L) 10/31/2020 1240   GFRNONAA 37 (L) 12/21/2019 0850   GFRAA 47 (L) 03/28/2020 0933   GFRAA 42 (L) 12/21/2019 0850    No results found for: SPEP, UPEP  Lab Results  Component Value Date  WBC 7.4 10/31/2020   NEUTROABS 5.3 10/31/2020   HGB 10.7 (L) 10/31/2020   HCT 30.3 (L) 10/31/2020   MCV 90.2 10/31/2020   PLT 215 10/31/2020      Chemistry      Component Value Date/Time   NA 138 10/31/2020 1240   NA 139 02/13/2017 1512   K 3.9 10/31/2020 1240   K 4.2 02/13/2017 1512   CL 99 10/31/2020 1240   CO2 26 10/31/2020 1240   CO2 28 02/13/2017 1512   BUN 22 10/31/2020 1240   BUN 25.2 02/13/2017 1512   CREATININE 1.27 (H) 10/31/2020 1240   CREATININE 1.37 (H) 12/21/2019 0850   CREATININE 1.2 (H) 02/13/2017 1512      Component Value Date/Time   CALCIUM 9.0 10/31/2020 1240   CALCIUM 10.0 02/13/2017 1512   ALKPHOS 70 10/31/2020  1240   AST 21 10/31/2020 1240   AST 19 12/21/2019 0850   ALT 13 10/31/2020 1240   ALT 13 12/21/2019 0850   BILITOT 0.5 10/31/2020 1240   BILITOT 0.4 12/21/2019 0850

## 2020-11-03 ENCOUNTER — Other Ambulatory Visit (HOSPITAL_COMMUNITY): Payer: Medicare HMO

## 2020-11-20 ENCOUNTER — Other Ambulatory Visit: Payer: Self-pay

## 2020-11-20 ENCOUNTER — Ambulatory Visit (HOSPITAL_COMMUNITY)
Admission: RE | Admit: 2020-11-20 | Discharge: 2020-11-20 | Disposition: A | Discharge status: Home / Self Care | Payer: Medicare HMO | Source: Ambulatory Visit | Attending: Hematology and Oncology | Admitting: Hematology and Oncology

## 2020-11-20 DIAGNOSIS — Z01818 Encounter for other preprocedural examination: Secondary | ICD-10-CM | POA: Diagnosis not present

## 2020-11-20 DIAGNOSIS — Z0189 Encounter for other specified special examinations: Secondary | ICD-10-CM | POA: Diagnosis not present

## 2020-11-20 DIAGNOSIS — E785 Hyperlipidemia, unspecified: Secondary | ICD-10-CM | POA: Diagnosis not present

## 2020-11-20 DIAGNOSIS — C541 Malignant neoplasm of endometrium: Secondary | ICD-10-CM | POA: Insufficient documentation

## 2020-11-20 DIAGNOSIS — I351 Nonrheumatic aortic (valve) insufficiency: Secondary | ICD-10-CM | POA: Diagnosis not present

## 2020-11-20 DIAGNOSIS — I10 Essential (primary) hypertension: Secondary | ICD-10-CM | POA: Insufficient documentation

## 2020-11-20 LAB — ECHOCARDIOGRAM COMPLETE
AR max vel: 1.82 cm2
AV Area VTI: 1.78 cm2
AV Area mean vel: 1.74 cm2
AV Mean grad: 3 mmHg
AV Peak grad: 5.2 mmHg
Ao pk vel: 1.14 m/s
Area-P 1/2: 3.31 cm2
Calc EF: 62.3 %
P 1/2 time: 535 msec
S' Lateral: 2.1 cm
Single Plane A2C EF: 54 %
Single Plane A4C EF: 68 %

## 2020-11-20 NOTE — Progress Notes (Signed)
*  PRELIMINARY RESULTS* Echocardiogram 2D Echocardiogram has been performed with strain imaging.  Luisa Hart RDCS 11/20/2020, 10:59 AM

## 2020-11-21 ENCOUNTER — Inpatient Hospital Stay: Payer: Medicare HMO | Attending: Gynecologic Oncology

## 2020-11-21 ENCOUNTER — Inpatient Hospital Stay: Payer: Medicare HMO

## 2020-11-21 VITALS — BP 159/64 | HR 66 | Temp 97.7°F | Resp 18

## 2020-11-21 DIAGNOSIS — C541 Malignant neoplasm of endometrium: Secondary | ICD-10-CM

## 2020-11-21 DIAGNOSIS — N183 Chronic kidney disease, stage 3 unspecified: Secondary | ICD-10-CM | POA: Insufficient documentation

## 2020-11-21 DIAGNOSIS — Z79899 Other long term (current) drug therapy: Secondary | ICD-10-CM | POA: Diagnosis not present

## 2020-11-21 DIAGNOSIS — Z7982 Long term (current) use of aspirin: Secondary | ICD-10-CM | POA: Insufficient documentation

## 2020-11-21 DIAGNOSIS — Z5112 Encounter for antineoplastic immunotherapy: Secondary | ICD-10-CM | POA: Diagnosis present

## 2020-11-21 DIAGNOSIS — D631 Anemia in chronic kidney disease: Secondary | ICD-10-CM | POA: Insufficient documentation

## 2020-11-21 DIAGNOSIS — M8580 Other specified disorders of bone density and structure, unspecified site: Secondary | ICD-10-CM | POA: Diagnosis not present

## 2020-11-21 DIAGNOSIS — C779 Secondary and unspecified malignant neoplasm of lymph node, unspecified: Secondary | ICD-10-CM | POA: Diagnosis present

## 2020-11-21 LAB — CBC WITH DIFFERENTIAL/PLATELET
Abs Immature Granulocytes: 0.03 10*3/uL (ref 0.00–0.07)
Basophils Absolute: 0.1 10*3/uL (ref 0.0–0.1)
Basophils Relative: 2 %
Eosinophils Absolute: 0.2 10*3/uL (ref 0.0–0.5)
Eosinophils Relative: 3 %
HCT: 33.1 % — ABNORMAL LOW (ref 36.0–46.0)
Hemoglobin: 11.5 g/dL — ABNORMAL LOW (ref 12.0–15.0)
Immature Granulocytes: 0 %
Lymphocytes Relative: 23 %
Lymphs Abs: 1.7 10*3/uL (ref 0.7–4.0)
MCH: 31.4 pg (ref 26.0–34.0)
MCHC: 34.7 g/dL (ref 30.0–36.0)
MCV: 90.4 fL (ref 80.0–100.0)
Monocytes Absolute: 0.6 10*3/uL (ref 0.1–1.0)
Monocytes Relative: 8 %
Neutro Abs: 4.6 10*3/uL (ref 1.7–7.7)
Neutrophils Relative %: 64 %
Platelets: 230 10*3/uL (ref 150–400)
RBC: 3.66 MIL/uL — ABNORMAL LOW (ref 3.87–5.11)
RDW: 11.9 % (ref 11.5–15.5)
WBC: 7.2 10*3/uL (ref 4.0–10.5)
nRBC: 0 % (ref 0.0–0.2)

## 2020-11-21 LAB — COMPREHENSIVE METABOLIC PANEL
ALT: 14 U/L (ref 0–44)
AST: 21 U/L (ref 15–41)
Albumin: 4.1 g/dL (ref 3.5–5.0)
Alkaline Phosphatase: 69 U/L (ref 38–126)
Anion gap: 12 (ref 5–15)
BUN: 30 mg/dL — ABNORMAL HIGH (ref 8–23)
CO2: 28 mmol/L (ref 22–32)
Calcium: 9.7 mg/dL (ref 8.9–10.3)
Chloride: 101 mmol/L (ref 98–111)
Creatinine, Ser: 1.52 mg/dL — ABNORMAL HIGH (ref 0.44–1.00)
GFR, Estimated: 34 mL/min — ABNORMAL LOW (ref >60)
Glucose, Bld: 89 mg/dL (ref 70–99)
Potassium: 3.6 mmol/L (ref 3.5–5.1)
Sodium: 141 mmol/L (ref 135–145)
Total Bilirubin: 0.3 mg/dL (ref 0.3–1.2)
Total Protein: 7.3 g/dL (ref 6.5–8.1)

## 2020-11-21 MED ORDER — ACETAMINOPHEN 325 MG PO TABS
ORAL_TABLET | ORAL | Status: AC
  Filled 2020-11-21: qty 2

## 2020-11-21 MED ORDER — ACETAMINOPHEN 325 MG PO TABS
650.0000 mg | ORAL_TABLET | Freq: Once | ORAL | Status: AC
Start: 2020-11-21 — End: 2020-11-21
  Administered 2020-11-21: 650 mg via ORAL

## 2020-11-21 MED ORDER — DIPHENHYDRAMINE HCL 25 MG PO TABS
25.0000 mg | ORAL_TABLET | Freq: Once | ORAL | Status: AC
  Administered 2020-11-21: 25 mg via ORAL
  Filled 2020-11-21: qty 1

## 2020-11-21 MED ORDER — DIPHENHYDRAMINE HCL 25 MG PO CAPS
ORAL_CAPSULE | ORAL | Status: AC
  Filled 2020-11-21: qty 1

## 2020-11-21 MED ORDER — SODIUM CHLORIDE 0.9 % IV SOLN
Freq: Once | INTRAVENOUS | Status: AC
  Filled 2020-11-21: qty 250

## 2020-11-21 MED ORDER — TRASTUZUMAB-ANNS CHEMO 150 MG IV SOLR
6.0000 mg/kg | Freq: Once | INTRAVENOUS | Status: AC
Start: 2020-11-21 — End: 2020-11-21
  Administered 2020-11-21: 357 mg via INTRAVENOUS
  Filled 2020-11-21: qty 17

## 2020-11-21 MED ORDER — SODIUM CHLORIDE 0.9% FLUSH
10.0000 mL | Freq: Once | INTRAVENOUS | Status: AC
  Administered 2020-11-21: 10 mL
  Filled 2020-11-21: qty 10

## 2020-11-21 MED ORDER — SODIUM CHLORIDE 0.9% FLUSH
10.0000 mL | INTRAVENOUS | Status: DC | PRN
  Administered 2020-11-21: 10 mL
  Filled 2020-11-21: qty 10

## 2020-11-21 MED ORDER — HEPARIN SOD (PORK) LOCK FLUSH 100 UNIT/ML IV SOLN
500.0000 [IU] | Freq: Once | INTRAVENOUS | Status: AC | PRN
Start: 2020-11-21 — End: 2020-11-21
  Administered 2020-11-21: 500 [IU]
  Filled 2020-11-21: qty 5

## 2020-11-21 NOTE — Patient Instructions (Signed)
[facility]  Discharge Instructions: °Thank you for choosing [facility] to provide your oncology and hematology care.  ° °If you have a lab appointment with the Cancer Center, please go directly to the Cancer Center and check in at the registration area. °  °Wear comfortable clothing and clothing appropriate for easy access to any Portacath or PICC line.  ° °We strive to give you quality time with your provider. You may need to reschedule your appointment if you arrive late (15 or more minutes).  Arriving late affects you and other patients whose appointments are after yours.  Also, if you miss three or more appointments without notifying the office, you may be dismissed from the clinic at the provider’s discretion.    °  °For prescription refill requests, have your pharmacy contact our office and allow 72 hours for refills to be completed.   ° °Today you received the following chemotherapy and/or immunotherapy agents: Kanjinti °  °To help prevent nausea and vomiting after your treatment, we encourage you to take your nausea medication as directed. ° °BELOW ARE SYMPTOMS THAT SHOULD BE REPORTED IMMEDIATELY: °*FEVER GREATER THAN 100.4 F (38 °C) OR HIGHER °*CHILLS OR SWEATING °*NAUSEA AND VOMITING THAT IS NOT CONTROLLED WITH YOUR NAUSEA MEDICATION °*UNUSUAL SHORTNESS OF BREATH °*UNUSUAL BRUISING OR BLEEDING °*URINARY PROBLEMS (pain or burning when urinating, or frequent urination) °*BOWEL PROBLEMS (unusual diarrhea, constipation, pain near the anus) °TENDERNESS IN MOUTH AND THROAT WITH OR WITHOUT PRESENCE OF ULCERS (sore throat, sores in mouth, or a toothache) °UNUSUAL RASH, SWELLING OR PAIN  °UNUSUAL VAGINAL DISCHARGE OR ITCHING  ° °Items with * indicate a potential emergency and should be followed up as soon as possible or go to the Emergency Department if any problems should occur. ° °Please show the CHEMOTHERAPY ALERT CARD or IMMUNOTHERAPY ALERT CARD at check-in to the  Emergency Department and triage nurse. ° °Should you have questions after your visit or need to cancel or reschedule your appointment, please contact [facility]  Dept: [phone]  and follow the prompts.  Office hours are 8:00 a.m. to 4:30 p.m. Monday - Friday. Please note that voicemails left after 4:00 p.m. may not be returned until the following business day.  We are closed weekends and major holidays. You have access to a nurse at all times for urgent questions. Please call the main number to the clinic Dept: [phone] and follow the prompts. ° ° °For any non-urgent questions, you may also contact your provider using MyChart. We now offer e-Visits for anyone 18 and older to request care online for non-urgent symptoms. For details visit [email or web address]. °  °Also download the MyChart app! Go to the app store, search "MyChart", open the app, select [facility], and log in with your MyChart username and password. ° °Due to Covid, a mask is required upon entering the hospital/clinic. If you do not have a mask, one will be given to you upon arrival. For doctor visits, patients may have 1 support person aged 18 or older with them. For treatment visits, patients cannot have anyone with them due to current Covid guidelines and our immunocompromised population.  ° °

## 2020-11-21 NOTE — Progress Notes (Signed)
Okay to proceed with treatment today per Dr. Alvy Bimler. Encouraged patient to increase oral fluid intake.

## 2020-12-12 ENCOUNTER — Inpatient Hospital Stay: Payer: Medicare HMO

## 2020-12-12 ENCOUNTER — Other Ambulatory Visit: Payer: Self-pay

## 2020-12-12 ENCOUNTER — Encounter: Payer: Self-pay | Admitting: Hematology and Oncology

## 2020-12-12 ENCOUNTER — Inpatient Hospital Stay: Payer: Medicare HMO | Admitting: Hematology and Oncology

## 2020-12-12 VITALS — Temp 98.2°F

## 2020-12-12 DIAGNOSIS — Z5112 Encounter for antineoplastic immunotherapy: Secondary | ICD-10-CM | POA: Diagnosis not present

## 2020-12-12 DIAGNOSIS — N183 Chronic kidney disease, stage 3 unspecified: Secondary | ICD-10-CM | POA: Diagnosis not present

## 2020-12-12 DIAGNOSIS — I10 Essential (primary) hypertension: Secondary | ICD-10-CM

## 2020-12-12 DIAGNOSIS — C779 Secondary and unspecified malignant neoplasm of lymph node, unspecified: Secondary | ICD-10-CM | POA: Diagnosis not present

## 2020-12-12 DIAGNOSIS — C541 Malignant neoplasm of endometrium: Secondary | ICD-10-CM

## 2020-12-12 DIAGNOSIS — Z79899 Other long term (current) drug therapy: Secondary | ICD-10-CM | POA: Diagnosis not present

## 2020-12-12 DIAGNOSIS — D631 Anemia in chronic kidney disease: Secondary | ICD-10-CM | POA: Diagnosis not present

## 2020-12-12 DIAGNOSIS — Z7982 Long term (current) use of aspirin: Secondary | ICD-10-CM | POA: Diagnosis not present

## 2020-12-12 DIAGNOSIS — M8580 Other specified disorders of bone density and structure, unspecified site: Secondary | ICD-10-CM | POA: Diagnosis not present

## 2020-12-12 LAB — CBC WITH DIFFERENTIAL/PLATELET
Abs Immature Granulocytes: 0.04 10*3/uL (ref 0.00–0.07)
Basophils Absolute: 0.1 10*3/uL (ref 0.0–0.1)
Basophils Relative: 2 %
Eosinophils Absolute: 0.2 10*3/uL (ref 0.0–0.5)
Eosinophils Relative: 3 %
HCT: 30.9 % — ABNORMAL LOW (ref 36.0–46.0)
Hemoglobin: 10.8 g/dL — ABNORMAL LOW (ref 12.0–15.0)
Immature Granulocytes: 1 %
Lymphocytes Relative: 21 %
Lymphs Abs: 1.4 10*3/uL (ref 0.7–4.0)
MCH: 31.4 pg (ref 26.0–34.0)
MCHC: 35 g/dL (ref 30.0–36.0)
MCV: 89.8 fL (ref 80.0–100.0)
Monocytes Absolute: 0.6 10*3/uL (ref 0.1–1.0)
Monocytes Relative: 8 %
Neutro Abs: 4.5 10*3/uL (ref 1.7–7.7)
Neutrophils Relative %: 65 %
Platelets: 217 10*3/uL (ref 150–400)
RBC: 3.44 MIL/uL — ABNORMAL LOW (ref 3.87–5.11)
RDW: 11.9 % (ref 11.5–15.5)
WBC: 6.8 10*3/uL (ref 4.0–10.5)
nRBC: 0 % (ref 0.0–0.2)

## 2020-12-12 LAB — COMPREHENSIVE METABOLIC PANEL
ALT: 14 U/L (ref 0–44)
AST: 21 U/L (ref 15–41)
Albumin: 3.8 g/dL (ref 3.5–5.0)
Alkaline Phosphatase: 64 U/L (ref 38–126)
Anion gap: 13 (ref 5–15)
BUN: 27 mg/dL — ABNORMAL HIGH (ref 8–23)
CO2: 25 mmol/L (ref 22–32)
Calcium: 9.3 mg/dL (ref 8.9–10.3)
Chloride: 103 mmol/L (ref 98–111)
Creatinine, Ser: 1.33 mg/dL — ABNORMAL HIGH (ref 0.44–1.00)
GFR, Estimated: 40 mL/min — ABNORMAL LOW (ref >60)
Glucose, Bld: 102 mg/dL — ABNORMAL HIGH (ref 70–99)
Potassium: 3.8 mmol/L (ref 3.5–5.1)
Sodium: 141 mmol/L (ref 135–145)
Total Bilirubin: 0.4 mg/dL (ref 0.3–1.2)
Total Protein: 6.7 g/dL (ref 6.5–8.1)

## 2020-12-12 MED ORDER — DIPHENHYDRAMINE HCL 25 MG PO CAPS
ORAL_CAPSULE | ORAL | Status: AC
  Filled 2020-12-12: qty 1

## 2020-12-12 MED ORDER — DIPHENHYDRAMINE HCL 25 MG PO TABS
25.0000 mg | ORAL_TABLET | Freq: Once | ORAL | Status: AC
  Administered 2020-12-12: 25 mg via ORAL
  Filled 2020-12-12: qty 1

## 2020-12-12 MED ORDER — SODIUM CHLORIDE 0.9 % IV SOLN
Freq: Once | INTRAVENOUS | Status: AC
  Filled 2020-12-12: qty 250

## 2020-12-12 MED ORDER — SODIUM CHLORIDE 0.9% FLUSH
10.0000 mL | Freq: Once | INTRAVENOUS | Status: AC
  Administered 2020-12-12: 10 mL
  Filled 2020-12-12: qty 10

## 2020-12-12 MED ORDER — ACETAMINOPHEN 325 MG PO TABS
650.0000 mg | ORAL_TABLET | Freq: Once | ORAL | Status: AC
  Administered 2020-12-12: 650 mg via ORAL

## 2020-12-12 MED ORDER — TRASTUZUMAB-ANNS CHEMO 150 MG IV SOLR
6.0000 mg/kg | Freq: Once | INTRAVENOUS | Status: AC
  Administered 2020-12-12: 378 mg via INTRAVENOUS
  Filled 2020-12-12: qty 18

## 2020-12-12 MED ORDER — ACETAMINOPHEN 325 MG PO TABS
ORAL_TABLET | ORAL | Status: AC
  Filled 2020-12-12: qty 2

## 2020-12-12 MED ORDER — HEPARIN SOD (PORK) LOCK FLUSH 100 UNIT/ML IV SOLN
500.0000 [IU] | Freq: Once | INTRAVENOUS | Status: AC | PRN
  Administered 2020-12-12: 500 [IU]
  Filled 2020-12-12: qty 5

## 2020-12-12 MED ORDER — SODIUM CHLORIDE 0.9% FLUSH
10.0000 mL | INTRAVENOUS | Status: DC | PRN
  Administered 2020-12-12: 10 mL
  Filled 2020-12-12: qty 10

## 2020-12-12 NOTE — Patient Instructions (Signed)

## 2020-12-12 NOTE — Assessment & Plan Note (Addendum)
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

## 2020-12-12 NOTE — Progress Notes (Signed)
[facility] PROGRESS NOTE  Patient Care Team: Vicki Bunting, [name]  ASSESSMENT & PLAN:  Endometrial cancer [facility]) She tolerated single agent trastuzumab well She has no signs of cancer recurrence I recommend continued imaging study every 6 months, next would be due around September I plan to continue echocardiogram monitoring every 3 months, next due in August   CKD (chronic kidney disease), stage III ([place]) Renal function is stable. Monitor closely She was seen by nephrologist recently  Essential hypertension She is noted to have consistent high blood pressure According to the patient, she does not check her blood pressure regularly and does not on the blood pressure machine Her recent echocardiogram show changes consistent with uncontrolled hypertension I recommend she start checking her blood pressure twice daily and to review with her primary care doctor about blood pressure management I told her uncontrolled hypertension can cause chronic renal failure   No orders of the defined types were placed in this encounter.   All questions were answered. The patient knows to call the clinic with any problems, questions or concerns. The total time spent in the appointment was 20 minutes encounter with patients including review of chart and various tests results, discussions about plan of care and coordination of care plan   Vicki Lark, MD 12/12/2020 3:06 PM  INTERVAL HISTORY: Please see below for problem oriented charting. 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 Her cancer was treated with surgery but no radiation or chemotherapy.   Serous endometrial cancer, MSI stable ER positive, PR neg, Her2/neu 3+      Endometrial cancer ([place])  08/29/2016 Pathology Results   Endometrium, biopsy - HIGH GRADE ENDOMETRIAL CARCINOMA, SEE COMMENT. Microscopic Comment The sections show multiple fragments of adenocarcinoma displaying glandular and papillary patterns associated with high grade cytomorphology characterized by nuclear pleomorphism, prominent nucleoli and brisk mitosis. Immunohistochemical stains show that the tumor cells are positive for vimentin, p16, p53 with increased Ki-67 expression. Estrogen and progesterone receptor stains show patchy weak positivity. No significant positivity is seen with CEA. The findings are consistent with high grade endometrial carcinoma and the overall morphology and phenotypic features favor serous carcinoma.   08/29/2016 Initial Diagnosis   She presented with postmenopausal bleeding   10/03/2016 Imaging   CT C/A/P 09/2016 IMPRESSION: 1. Thickening of the endometrial canal up to 19 mm in fundus, presumably corresponding to the patient's reported endometrial carcinoma. 2. Multiple tiny pulmonary nodules scattered throughout the lungs bilaterally measuring 4 mm or less in size. Nodules of this size are typically considered statistically likely benign. In the setting of known primary malignancy, metastatic disease to the lungs is not excluded, but is not strongly favored on today's examination. Attention on followup studies is recommended to ensure the stability or resolution of these nodules. 3. Subcentimeter low-attenuation lesion in the central aspect of segment 8 of the liver is too small to characterize. This is statistically likely a tiny cyst, but warrants attention on follow-up studies to exclude the possibility of a solitary hepatic metastasis. 4. 1.5 x 1.5 x 1.7 cm well-circumscribed lesion  in the  proximal stomach. This is of uncertain etiology and significance, and could represent a benign gastric polyp, however, further evaluation with nonemergent endoscopy is suggested in the near future for further evaluation. 5. **An incidental finding of potential clinical significance has been found. 1.1 x 1.6 cm thyroid nodule in the inferior aspect of the right lobe of the thyroid gland. Follow-up evaluation with nonemergent thyroid ultrasound is recommended in the near future to better evaluate this finding. This recommendation follows ACR consensuss guidelines: Managing Incidental Thyroid Nodules Detected on Imaging: White Paper of the ACR Incidental Thyroid Findings Committee. J Am Coll Radiol 2015;12(2):143-150.** 6. Aortic atherosclerosis, in addition to left anterior descending coronary artery disease   10/22/2016 Surgery   Robotic assisted total hysterectomy, BSO and bilateral pelvic lymphadenectomy  Final pathology revealed a 3cm polyp containing serous carcinoma but with no myometrial invasion, no LVSI and negative nodes.  Stage IA Uterine serous cancer   10/22/2016 Pathology Results   1. Lymph nodes, regional resection, right pelvic - SIX BENIGN LYMPH NODES (0/6). 2. Lymph nodes, regional resection, left pelvic - SEVEN BENIGN LYMPH NODES (0/7). 3. Uterus +/- tubes/ovaries, neoplastic, with right ovary and fallopian tube ENDOMYOMETRIUM - SEROUS CARCINOMA ARISING WITHIN AN ENDOMETRIAL POLYP - NO MYOMETRIAL INVASION IDENTIFIED - ADENOMYOSIS - LEIOMYOMA (1 CM) - SEE ONCOLOGY TABLE AND COMMENT CERVIX - CARCINOMA FOCALLY INVOLVES ENDOCERVICAL GLANDS - NABOTHIAN CYSTS RIGHT ADNEXA - BENIGN OVARY AND FALLOPIAN TUBE - NO CARCINOMA IDENTIFIED 4. Cul-de-sac biopsy - MESOTHELIAL HYPERPLASIA Microscopic Comment 3. ONCOLOGY TABLE-UTERUS, CARCINOMA OR CARCINOSARCOMA Specimen: Uterus, right fallopian tube and ovary Procedure: Total hysterectomy and right salpingo-oophorectomy Lymph node  sampling performed: Bilateral pelvic regional resection Specimen integrity: Intact Maximum tumor size: 3 cm (polyp) Histologic type: Serous carcinoma Grade: High grade Myometrial invasion: Not identified Cervical stromal involvement: No, focal endocervical gland involvement Extent of involvement of other organs: Not identified Lymph - vascular invasion: Not identified Peritoneal washings: N/A Lymph nodes: Examined: 0 Sentinel 13 Non-sentinel 13 Total Lymph nodes with metastasis: 0 Isolated tumor cells (< 0.2 mm): 0 Micrometastasis: (> 0.2 mm and < 2.0 mm): 0 Macrometastasis: (> 2.0 mm): 0 Extracapsular extension: N/A Pelvic lymph nodes: 0 involved of 13 lymph nodes. Para-aortic lymph nodes: No para-aortic nodes submitted TNM code: pT1a, pNX FIGO Stage (based on pathologic findings, needs clinical correlation): IA Comment: Immunohistochemistry for cytokeratin AE1/AE3 is performed on all of the lymph nodes (parts 1 & 2) and no metastatic carcinoma is identified.   07/04/2017 Genetic Testing   The patient had genetic testing due to a personal history of breast and uterine cancer, and a family history of stomach cancer.  The Multi-Cancer Panel was ordered. The Multi-Cancer Panel offered by [facility] includes sequencing and/or deletion duplication testing of the following 83 genes: ALK, APC, ATM, AXIN2,BAP1,  BARD1, BLM, BMPR1A, BRCA1, BRCA2, BRIP1, CASR, CDC73, CDH1, CDK4, CDKN1B, CDKN1C, CDKN2A (p14ARF), CDKN2A (p16INK4a), CEBPA, CHEK2, CTNNA1, DICER1, DIS3L2, EGFR (c.2369C>T, p.Thr790Met variant only), EPCAM (Deletion/duplication testing only), FH, FLCN, GATA2, GPC3, GREM1 (Promoter region deletion/duplication testing only), HOXB13 (c.251G>A, p.Gly84Glu), HRAS, KIT, MAX, MEN1, MET, MITF (c.952G>A, p.Glu318Lys variant only), MLH1, MSH2, MSH3, MSH6, MUTYH, NBN, NF1, NF2, NTHL1, PALB2, PDGFRA, PHOX2B, PMS2, POLD1, POLE, POT1, PRKAR1A, PTCH1, PTEN, RAD50, RAD51C, RAD51D, RB1, RECQL4, RET, RUNX1,  SDHAF2, SDHA (sequence changes only), SDHB, SDHC, SDHD, SMAD4, SMARCA4, SMARCB1, SMARCE1, STK11, SUFU, TERC, TERT, TMEM127, TP53, TSC1, TSC2, VHL, WRN and WT1.   Results: No pathogenic variants were identified.  A variant of uncertain significance in  the gene APC was identified.  c.791A>G (p.Gln264Arg).  The date of this test report is 07/04/2017.     Genetic Testing   Patient has genetic testing done for MSI. 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    12/03/2017 Imaging   CT Chest/Abd/Pelvis to follow pulmonary nodule and gastric mass IMPRESSION: 1. Stable CT of the chest. Small pulmonary nodules are unchanged when compared with previous exam. 2. No new findings identified. 3. Subcentimeter low-attenuation lesions within the liver are remain too small to characterize but are stable from prior exam. 4. Persistent indeterminate low-attenuation structure within the proximal stomach is unchanged measuring 1.4 cm. Correlation with direct visualization is advised   06/30/2018 Imaging   CT CHEST Lungs/Pleura: Stable scattered sub-cm pulmonary nodules are again seen bilaterally and are stable compared to previous studies. No new or enlarging pulmonary nodules or masses identified. No evidence of pulmonary infiltrate or pleural effusion.   08/19/2018 Echocardiogram   ECHO is done in [place]: EF 60-65%. Mild impaired relaxation. (report scanned)   09/17/2018 Relapse/Recurrence   Presented with c/o vagina discharge.  Lesion noted at the left vaginal apex 61m lesion removed Path c/w high grade serous cancer   09/17/2018 Pathology Results   Vagina, biopsy, left apex - HIGH GRADE SEROUS CARCINOMA.   09/28/2018 PET scan   1. Two hypermetabolic axial lymph nodes. Unusual site for metastatic endometrial carcinoma however the activity is more intense than typically seen in reactive adenopathy. Suggest ultrasound-guided percutaneous biopsy of the larger RIGHT axial lymph  node 2. No evidence of local recurrence at the vaginal cuff.  3. No metastatic adenopathy in the abdomen or pelvis. 4. Stable small pulmonary nodules   10/09/2018 Pathology Results   Lymph node, needle/core biopsy, right axilla - METASTATIC CARCINOMA, SEE COMMENT. Microscopic Comment The carcinoma appears high grade. Immunohistochemistry is positive for cytokeratin 7, PAX-8, and ER. Cytokeratin 20, CDX-2, PR, and GATA-3 are negative. The findings along with the history are consistent with a gynecologic primary.    10/09/2018 Procedure   Ultrasound-guided core biopsies of a right axillary lymph node.   10/14/2018 Cancer Staging   Staging form: Corpus Uteri - Carcinoma and Carcinosarcoma, AJCC 8th Edition - Clinical: Stage IVB (cT1, cN0, pM1) - Signed by GHeath Lark MD on 10/14/2018   10/19/2018 Imaging   Placement of single lumen port a cath via right internal jugular vein. The catheter tip lies at the cavo-atrial junction. A power injectable port a cath was placed and is ready for immediate use.    10/21/2018 -  Chemotherapy   The patient had carboplatin and taxol for treatment   11/11/2018 -  Chemotherapy      Patient is on Antibody Plan: BREAST TRASTUZUMAB Q21D    12/21/2018 PET scan   1. Interval resolution of hypermetabolic right axillary lymph nodes. No metabolic findings highly suspicious for recurrent metastatic disease. 2. New mild hypermetabolism within a borderline prominent portacaval lymph node, nonspecific. While a reactive node is favored, a lymph node metastasis cannot be entirely excluded. Suggest attention to this lymph node on follow-up PET-CT in 3-6 months. 3. Nonspecific new hypermetabolism at the ileocecal valve, more likely physiologic given absence of CT correlate. 4. Scattered subcentimeter pulmonary nodules are all stable and below PET resolution, more likely benign, continued CT surveillance advised. 5. Chronic findings include: Aortic Atherosclerosis  (ICD10-I70.0). Marked diffuse colonic diverticulosis. Coronary atherosclerosis.    12/28/2018 Echocardiogram   1. The left ventricle has normal systolic function with  an ejection fraction of 60-65%. The cavity size was normal. Left ventricular diastolic Doppler parameters are consistent with impaired relaxation.  2. GLS recorded as -10.7 but LV appears hyperdynamic and tracking of endocardium appears poor.  3. The right ventricle has normal systolic function. The cavity was normal. There is no increase in right ventricular wall thickness.  4. Mild thickening of the mitral valve leaflet.  5. The aortic valve was not well visualized. Mild thickening of the aortic valve. Aortic valve regurgitation is trivial by color flow Doppler.   03/23/2019 Imaging   PET 1. No findings of hypermetabolic residual/recurrent or metastatic disease. 2. Similar low-level hypermetabolism within normal sized portocaval and right inguinal nodes, favored to be reactive. 3. Ongoing stability of small bilateral pulmonary nodules, favored to be benign. Below PET resolution. 4. Coronary artery atherosclerosis. Aortic Atherosclerosis   03/26/2019 Echocardiogram   1. The left ventricle has normal systolic function, with an ejection fraction of 55-60%. The cavity size was normal. Left ventricular diastolic Doppler parameters are consistent with impaired relaxation.  2. The right ventricle has normal systolic function. The cavity was normal.  3. The mitral valve is abnormal. Mild thickening of the mitral valve leaflet. There is mild mitral annular calcification present.  4. The tricuspid valve is grossly normal.  5. The aortic valve is tricuspid. Mild calcification of the aortic valve. No stenosis of the aortic valve.  6. The aorta is normal unless otherwise noted.  7. Normal LV systolic function; grade 1 diastolic dysfunction; HQU-04.7%.   06/30/2019 Imaging   Chest Impression:   1. No evidence thoracic metastasis. 2.  Stable small benign-appearing pulmonary nodules   Abdomen / Pelvis Impression:   1. No evidence of metastatic disease in the abdomen pelvis. 2.  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 3.  Aortic Atherosclerosis (ICD10-I70.0).   06/30/2019 Echocardiogram    1. Left ventricular ejection fraction, by visual estimation, is 60 to 65%. The left ventricle has normal function. There is no left ventricular hypertrophy.  2. Left ventricular diastolic parameters are consistent with Grade I diastolic dysfunction (impaired relaxation).  3. The left ventricle has no regional wall motion abnormalities.  4. Global right ventricle has normal systolic function.The right ventricular size is normal. No increase in right ventricular wall thickness.  5. Left atrial size was mild-moderately dilated.  6. Right atrial size was normal.  7. The mitral valve is normal in structure. Trivial mitral valve regurgitation.  8. The tricuspid valve is normal in structure. Tricuspid valve regurgitation is trivial.  9. The aortic valve is normal in structure. Aortic valve regurgitation is trivial. No evidence of aortic valve sclerosis or stenosis. 10. The pulmonic valve was grossly normal. Pulmonic valve regurgitation is not visualized. 11. Mildly elevated pulmonary artery systolic pressure. 12. The inferior vena cava is normal in size with greater than 50% respiratory variability, suggesting right atrial pressure of 3 mmHg. 13. The average left ventricular global longitudinal strain is -12.5 %. GLS underestimated due to poor endocardial tracking.     09/27/2019 Imaging   1. Multiple small, nonspecific pulmonary nodules are again noted. The previously noted lung nodules are unchanged in size from previous exam. There is a new lung nodule within the medial right lower lobe which is nonspecific measure 4 mm. Attention at follow-up imaging is recommended. 2. No evidence of metastatic disease within the abdomen or  pelvis. 3. Coronary artery calcifications.  The 4.  Aortic Atherosclerosis (ICD10-I70.0).   09/30/2019 Echocardiogram    1.  Left ventricular ejection fraction, by estimation, is 60 to 65%. The left ventricle has normal function. The left ventricle has no regional wall motion abnormalities. Left ventricular diastolic parameters are consistent with Grade I diastolic dysfunction (impaired relaxation).  2. Right ventricular systolic function is normal. The right ventricular size is normal. There is normal pulmonary artery systolic pressure. The estimated right ventricular systolic pressure is 00.7 mmHg.  3. The mitral valve is normal in structure. No evidence of mitral valve regurgitation. No evidence of mitral stenosis.  4. The aortic valve is normal in structure. Aortic valve regurgitation is trivial. No aortic stenosis is present.  5. The inferior vena cava is normal in size with greater than 50% respiratory variability, suggesting right atrial pressure of 3 mmHg.     01/07/2020 Echocardiogram    1. Normal LV function; grade 1 diastolic dysfunction; mild AI; GLS -21%.  2. Left ventricular ejection fraction, by estimation, is 55 to 60%. The left ventricle has normal function. The left ventricle has no regional wall motion abnormalities. Left ventricular diastolic parameters are consistent with Grade I diastolic dysfunction (impaired relaxation).  3. Right ventricular systolic function is normal. The right ventricular size is normal.  4. The mitral valve is normal in structure. Trivial mitral valve regurgitation. No evidence of mitral stenosis.  5. The aortic valve is tricuspid. Aortic valve regurgitation is mild. Mild aortic valve sclerosis is present, with no evidence of aortic valve stenosis.  6. The inferior vena cava is normal in size with greater than 50% respiratory variability, suggesting right atrial pressure of 3 mmHg.     03/27/2020 Imaging   1. Status post hysterectomy. No evidence of  recurrent or metastatic disease in the abdomen or pelvis. 2. Multiple small pulmonary nodules in the lung bases are unchanged to the extent that they are included on current examination and remain most likely sequelae of prior infection or inflammation. Attention on follow-up. 3. Aortic Atherosclerosis (ICD10-I70.0).   04/17/2020 Echocardiogram   1. Normal LVEF, normal and unchanged GLS: -20.2%.  2. Left ventricular ejection fraction, by estimation, is 60 to 65%. The left ventricle has normal function. The left ventricle has no regional wall motion abnormalities. There is mild concentric left ventricular hypertrophy. Left ventricular diastolic parameters are consistent with Grade I diastolic dysfunction (impaired relaxation). The average left ventricular global longitudinal strain is -20.2 %. The global longitudinal strain is normal.  3. Right ventricular systolic function is normal. The right ventricular size is normal.  4. The mitral valve is normal in structure. Mild mitral valve regurgitation. No evidence of mitral stenosis.  5. The aortic valve is normal in structure. There is mild calcification of the aortic valve. There is mild thickening of the aortic valve. Aortic valve regurgitation is mild. No aortic stenosis is present.  6. The inferior vena cava is normal in size with greater than 50% respiratory variability, suggesting right atrial pressure of 3 mmHg.   08/02/2020 Echocardiogram   1. Left ventricular ejection fraction, by estimation, is 55 to 60%. The left ventricle has normal function. The left ventricle has no regional wall motion abnormalities. Left ventricular diastolic parameters are consistent with Grade I diastolic dysfunction (impaired relaxation). The average left ventricular global longitudinal strain is -27.0 %. The global longitudinal strain is normal.  2. Right ventricular systolic function is normal. The right ventricular size is normal. Tricuspid regurgitation signal is  inadequate for assessing PA pressure.  3. The mitral valve is grossly normal. Trivial mitral valve regurgitation. No evidence  of mitral stenosis.  4. The aortic valve is tricuspid. Aortic valve regurgitation is mild. No aortic stenosis is present.  5. The inferior vena cava is normal in size with greater than 50% respiratory variability, suggesting right atrial pressure of 3 mmHg.   09/19/2020 Imaging   1. Status post hysterectomy. No evidence of recurrent or new metastatic disease within the chest, abdomen, or pelvis. 2. Scattered bilateral tiny pulmonary nodules, unchanged from prior studies and most likely sequela of prior infection or inflammation, recommend attention on follow-up imaging. No new suspicious pulmonary nodules or masses. 3. Subcutaneous edema and small hematoma overlying the posterior gluteal musculature, recommend correlation with recent history of trauma. 4. Mild low-density wall thickening of the gastric antrum, which may represent gastritis. 5. Hepatic steatosis. 6. Extensive sigmoid colonic diverticulosis without findings of acute diverticulitis. 7. Aortic atherosclerosis.  Aortic Atherosclerosis (ICD10-I70.0).   11/20/2020 Echocardiogram    1. Compared to echo from Jan 2022, Global longitudinal strain is less negative. (Previously -27%). Left ventricular ejection fraction, by estimation, is 60 to 65%. The left ventricle has normal function. The left ventricle has no regional wall motion abnormalities. There is mild left ventricular hypertrophy. Left ventricular diastolic parameters are indeterminate.  2. Right ventricular systolic function is normal. The right ventricular size is normal.  3. The mitral valve is normal in structure. Trivial mitral valve regurgitation.  4. Aortic valve regurgitation is mild. Mild aortic valve sclerosis is present, with no evidence of aortic valve stenosis.     Metastasis to lymph nodes ([place])  10/14/2018 Initial Diagnosis   Metastasis to lymph  nodes ([place])   10/21/2018 - 02/24/2019 Chemotherapy   The patient had palonosetron (ALOXI) injection 0.25 mg, 0.25 mg, Intravenous,  Once, 7 of 7 cycles Administration: 0.25 mg (10/21/2018), 0.25 mg (11/11/2018), 0.25 mg (12/02/2018), 0.25 mg (12/23/2018), 0.25 mg (01/13/2019), 0.25 mg (02/03/2019), 0.25 mg (02/24/2019) CARBOplatin (PARAPLATIN) 310 mg in sodium chloride 0.9 % 250 mL chemo infusion, 310 mg, Intravenous,  Once, 7 of 7 cycles Dose modification: 300 mg (original dose 311.5 mg, Cycle 4, Reason: Dose not tolerated) Administration: 310 mg (10/21/2018), 300 mg (11/11/2018), 300 mg (12/02/2018), 300 mg (12/23/2018), 300 mg (01/13/2019), 300 mg (02/03/2019), 300 mg (02/24/2019) PACLitaxel (TAXOL) 222 mg in sodium chloride 0.9 % 250 mL chemo infusion (> 25m/m2), 135 mg/m2 = 222 mg, Intravenous,  Once, 7 of 7 cycles Administration: 222 mg (10/21/2018), 222 mg (11/11/2018), 222 mg (12/02/2018), 222 mg (12/23/2018), 222 mg (01/13/2019), 222 mg (02/03/2019), 222 mg (02/24/2019) fosaprepitant (EMEND) 150 mg, dexamethasone (DECADRON) 12 mg in sodium chloride 0.9 % 145 mL IVPB, , Intravenous,  Once, 7 of 7 cycles Administration:  (10/21/2018),  (11/11/2018),  (12/02/2018),  (12/23/2018),  (01/13/2019),  (02/03/2019),  (02/24/2019)  for chemotherapy treatment.      REVIEW OF SYSTEMS:   Constitutional: Denies fevers, chills or abnormal weight loss Eyes: Denies blurriness of vision Ears, nose, mouth, throat, and face: Denies mucositis or sore throat Respiratory: Denies cough, dyspnea or wheezes Cardiovascular: Denies palpitation, chest discomfort or lower extremity swelling Gastrointestinal:  Denies nausea, heartburn or change in bowel habits Skin: Denies abnormal skin rashes Lymphatics: Denies new lymphadenopathy or easy bruising Neurological:Denies numbness, tingling or new weaknesses Behavioral/Psych: Mood is stable, no new changes  All other systems were reviewed with the patient and are negative.  I have reviewed the past  medical history, past surgical history, social history and family history with the patient and they are unchanged from previous note.  ALLERGIES:  has  No Known Allergies.  MEDICATIONS:  Current Outpatient Medications  Medication Sig Dispense Refill  . acetaminophen (TYLENOL) 500 MG tablet Take 1,000 mg by mouth every 6 (six) hours as needed.    Marland Kitchen aspirin EC 81 MG tablet Take 81 mg by mouth daily.    Marland Kitchen atorvastatin (LIPITOR) 20 MG tablet Take 20 mg by mouth every evening.     . Calcium Carbonate-Vitamin D (CALCIUM-VITAMIN D) 500-200 MG-UNIT per tablet Take 1 tablet by mouth daily.    . citalopram (CELEXA) 20 MG tablet Take 20 mg by mouth at bedtime.     Marland Kitchen estradiol (ESTRACE VAGINAL) 0.1 MG/GM vaginal cream Place 1 Applicatorful vaginally 3 (three) times a week. 42.5 g 12  . gabapentin (NEURONTIN) 600 MG tablet Take 600 mg by mouth at bedtime as needed.    Marland Kitchen levothyroxine (SYNTHROID, LEVOTHROID) 100 MCG tablet Take 100 mcg by mouth daily before breakfast.     . lidocaine-prilocaine (EMLA) cream Apply to affected area once 30 g 3  . losartan-hydrochlorothiazide (HYZAAR) 100-25 MG tablet     . magnesium oxide (MAG-OX) 400 (241.3 Mg) MG tablet Take 1 tablet (400 mg total) by mouth daily. 30 tablet 11  . metoprolol succinate (TOPROL-XL) 25 MG 24 hr tablet Take 25 mg by mouth daily.    . Multiple Vitamin (MULTIVITAMIN WITH MINERALS) TABS Take 1 tablet by mouth daily.    . ondansetron (ZOFRAN) 8 MG tablet Take 1 tablet (8 mg total) by mouth every 8 (eight) hours as needed. Start on the third day after chemotherapy. 30 tablet 1  . pantoprazole (PROTONIX) 40 MG tablet Take 40 mg by mouth every morning.  (Patient not taking: Reported on 02/15/2020)    . valACYclovir (VALTREX) 1000 MG tablet valacyclovir 1 gram tablet     No current facility-administered medications for this visit.   Facility-Administered Medications Ordered in Other Visits  Medication Dose Route Frequency Provider Last Rate Last Admin   . sodium chloride flush (NS) 0.9 % injection 10 mL  10 mL Intracatheter PRN Alvy Bimler, Truong Delcastillo, MD   10 mL at 12/12/20 1327    PHYSICAL EXAMINATION: ECOG PERFORMANCE STATUS: 0 - Asymptomatic  Vitals:   12/12/20 1128  BP: (!) 154/62  Pulse: 67  Resp: 18  SpO2: 95%   Filed Weights   12/12/20 1128  Weight: 136 lb (61.7 kg)    GENERAL:alert, no distress and comfortable SKIN: skin color, texture, turgor are normal, no rashes or significant lesions EYES: normal, Conjunctiva are pink and non-injected, sclera clear OROPHARYNX:no exudate, no erythema and lips, buccal mucosa, and tongue normal  NECK: supple, thyroid normal size, non-tender, without nodularity LYMPH:  no palpable lymphadenopathy in the cervical, axillary or inguinal LUNGS: clear to auscultation and percussion with normal breathing effort HEART: regular rate & rhythm and no murmurs and no lower extremity edema ABDOMEN:abdomen soft, non-tender and normal bowel sounds Musculoskeletal:no cyanosis of digits and no clubbing  NEURO: alert & oriented x 3 with fluent speech, no focal motor/sensory deficits  LABORATORY DATA:  I have reviewed the data as listed    Component Value Date/Time   NA 141 12/12/2020 1104   NA 139 02/13/2017 1512   K 3.8 12/12/2020 1104   K 4.2 02/13/2017 1512   CL 103 12/12/2020 1104   CO2 25 12/12/2020 1104   CO2 28 02/13/2017 1512   GLUCOSE 102 (H) 12/12/2020 1104   GLUCOSE 118 02/13/2017 1512   BUN 27 (H) 12/12/2020 1104   BUN 25.2 02/13/2017 1512  CREATININE 1.33 (H) 12/12/2020 1104   CREATININE 1.37 (H) 12/21/2019 0850   CREATININE 1.2 (H) 02/13/2017 1512   CALCIUM 9.3 12/12/2020 1104   CALCIUM 10.0 02/13/2017 1512   PROT 6.7 12/12/2020 1104   ALBUMIN 3.8 12/12/2020 1104   AST 21 12/12/2020 1104   AST 19 12/21/2019 0850   ALT 14 12/12/2020 1104   ALT 13 12/21/2019 0850   ALKPHOS 64 12/12/2020 1104   BILITOT 0.4 12/12/2020 1104   BILITOT 0.4 12/21/2019 0850   GFRNONAA 40 (L) 12/12/2020  1104   GFRNONAA 37 (L) 12/21/2019 0850   GFRAA 47 (L) 03/28/2020 0933   GFRAA 42 (L) 12/21/2019 0850    No results found for: SPEP, UPEP  Lab Results  Component Value Date   WBC 6.8 12/12/2020   NEUTROABS 4.5 12/12/2020   HGB 10.8 (L) 12/12/2020   HCT 30.9 (L) 12/12/2020   MCV 89.8 12/12/2020   PLT 217 12/12/2020      Chemistry      Component Value Date/Time   NA 141 12/12/2020 1104   NA 139 02/13/2017 1512   K 3.8 12/12/2020 1104   K 4.2 02/13/2017 1512   CL 103 12/12/2020 1104   CO2 25 12/12/2020 1104   CO2 28 02/13/2017 1512   BUN 27 (H) 12/12/2020 1104   BUN 25.2 02/13/2017 1512   CREATININE 1.33 (H) 12/12/2020 1104   CREATININE 1.37 (H) 12/21/2019 0850   CREATININE 1.2 (H) 02/13/2017 1512      Component Value Date/Time   CALCIUM 9.3 12/12/2020 1104   CALCIUM 10.0 02/13/2017 1512   ALKPHOS 64 12/12/2020 1104   AST 21 12/12/2020 1104   AST 19 12/21/2019 0850   ALT 14 12/12/2020 1104   ALT 13 12/21/2019 0850   BILITOT 0.4 12/12/2020 1104   BILITOT 0.4 12/21/2019 0850       RADIOGRAPHIC STUDIES: I have personally reviewed the radiological images as listed and agreed with the findings in the report. ECHOCARDIOGRAM COMPLETE  Result Date: 11/20/2020    ECHOCARDIOGRAM REPORT   Patient Name:   Vicki Cervantes Date of Exam: 11/20/2020 Medical Rec #:  [id number]      Height:       62.2 in Accession #:    [id number]     Weight:       136.2 lb Date of Birth:  May 01, 1941      BSA:          1.627 m Patient Age:    34 years       BP:           111/61 mmHg Patient Gender: F              HR:           66 bpm. Exam Location:  Inpatient Procedure: 2D Echo, Cardiac Doppler, Color Doppler and Strain Analysis Indications:    Chemo  History:        Patient has prior history of Echocardiogram examinations, most                 recent 08/02/2020. Risk Factors:Hypertension and Dyslipidemia.  Sonographer:    [place] Referring Phys: [id number] Armondo Cech Piru  1. Compared to echo  from Jan 2022, Global longitudinal strain is less negative. (Previously -27%). Left ventricular ejection fraction, by estimation, is 60 to 65%. The left ventricle has normal function. The left ventricle has no regional wall motion abnormalities. There is mild left ventricular hypertrophy. Left ventricular  diastolic parameters are indeterminate.  2. Right ventricular systolic function is normal. The right ventricular size is normal.  3. The mitral valve is normal in structure. Trivial mitral valve regurgitation.  4. Aortic valve regurgitation is mild. Mild aortic valve sclerosis is present, with no evidence of aortic valve stenosis. FINDINGS  Left Ventricle: Compared to echo from Jan 2022, Global longitudinal strain is less negative. (Previously -27%). Left ventricular ejection fraction, by estimation, is 60 to 65%. The left ventricle has normal function. The left ventricle has no regional wall motion abnormalities. The left ventricular internal cavity size was normal in size. There is mild left ventricular hypertrophy. Left ventricular diastolic parameters are indeterminate. Right Ventricle: The right ventricular size is normal. Right vetricular wall thickness was not assessed. Right ventricular systolic function is normal. Left Atrium: Left atrial size was normal in size. Right Atrium: Right atrial size was normal in size. Pericardium: There is no evidence of pericardial effusion. Mitral Valve: The mitral valve is normal in structure. Trivial mitral valve regurgitation. Tricuspid Valve: The tricuspid valve is normal in structure. Tricuspid valve regurgitation is trivial. Aortic Valve: Aortic valve regurgitation is mild. Aortic regurgitation PHT measures 535 msec. Mild aortic valve sclerosis is present, with no evidence of aortic valve stenosis. Aortic valve mean gradient measures 3.0 mmHg. Aortic valve peak gradient measures 5.2 mmHg. Aortic valve area, by VTI measures 1.78 cm. Pulmonic Valve: The pulmonic valve  was normal in structure. Pulmonic valve regurgitation is trivial. Aorta: The aortic root and ascending aorta are structurally normal, with no evidence of dilitation. IAS/Shunts: No atrial level shunt detected by color flow Doppler.  LEFT VENTRICLE PLAX 2D LVIDd:         3.60 cm     Diastology LVIDs:         2.10 cm     LV e' medial:    4.79 cm/s LV PW:         1.20 cm     LV E/e' medial:  12.5 LV IVS:        1.30 cm     LV e' lateral:   5.98 cm/s LVOT diam:     1.90 cm     LV E/e' lateral: 10.0 LV SV:         46 LV SV Index:   28          2D Longitudinal Strain LVOT Area:     2.84 cm    2D Strain GLS Avg:     -14.2 %  LV Volumes (MOD) LV vol d, MOD A2C: 28.7 ml LV vol d, MOD A4C: 69.7 ml LV vol s, MOD A2C: 13.2 ml LV vol s, MOD A4C: 22.3 ml LV SV MOD A2C:     15.5 ml LV SV MOD A4C:     69.7 ml LV SV MOD BP:      29.0 ml RIGHT VENTRICLE RV S prime:     8.38 cm/s TAPSE (M-mode): 1.9 cm LEFT ATRIUM             Index       RIGHT ATRIUM          Index LA diam:        3.40 cm 2.09 cm/m  RA Area:     9.01 cm LA Vol (A2C):   34.8 ml 21.38 ml/m RA Volume:   17.50 ml 10.75 ml/m LA Vol (A4C):   17.4 ml 10.69 ml/m LA Biplane Vol: 25.6 ml 15.73 ml/m  AORTIC VALVE  PULMONIC VALVE AV Area (Vmax):    1.82 cm    PV Vmax:       0.76 m/s AV Area (Vmean):   1.74 cm    PV Vmean:      58.600 cm/s AV Area (VTI):     1.78 cm    PV VTI:        0.169 m AV Vmax:           114.00 cm/s PV Peak grad:  2.3 mmHg AV Vmean:          83.800 cm/s PV Mean grad:  1.0 mmHg AV VTI:            0.256 m AV Peak Grad:      5.2 mmHg AV Mean Grad:      3.0 mmHg LVOT Vmax:         73.10 cm/s LVOT Vmean:        51.400 cm/s LVOT VTI:          0.161 m LVOT/AV VTI ratio: 0.63 AI PHT:            535 msec  AORTA Ao Root diam: 3.10 cm Ao Asc diam:  3.20 cm MITRAL VALVE               TRICUSPID VALVE MV Area (PHT): 3.31 cm    TR Peak grad:   23.6 mmHg MV Decel Time: 229 msec    TR Vmax:        243.00 cm/s MV E velocity: 60.00 cm/s MV A  velocity: 88.70 cm/s  SHUNTS MV E/A ratio:  0.68        Systemic VTI:  0.16 m                            Systemic Diam: 1.90 cm Dorris Carnes MD Electronically signed by Dorris Carnes MD Signature Date/Time: 11/20/2020/6:06:48 PM    Final

## 2020-12-12 NOTE — Assessment & Plan Note (Signed)
Renal function is stable. Monitor closely She was seen by nephrologist recently 

## 2020-12-12 NOTE — Patient Instructions (Signed)
[facility]  Discharge Instructions: °Thank you for choosing [facility] to provide your oncology and hematology care.  ° °If you have a lab appointment with the Cancer Center, please go directly to the Cancer Center and check in at the registration area. °  °Wear comfortable clothing and clothing appropriate for easy access to any Portacath or PICC line.  ° °We strive to give you quality time with your provider. You may need to reschedule your appointment if you arrive late (15 or more minutes).  Arriving late affects you and other patients whose appointments are after yours.  Also, if you miss three or more appointments without notifying the office, you may be dismissed from the clinic at the provider’s discretion.    °  °For prescription refill requests, have your pharmacy contact our office and allow 72 hours for refills to be completed.   ° °Today you received the following chemotherapy and/or immunotherapy agents: Kanjinti °  °To help prevent nausea and vomiting after your treatment, we encourage you to take your nausea medication as directed. ° °BELOW ARE SYMPTOMS THAT SHOULD BE REPORTED IMMEDIATELY: °*FEVER GREATER THAN 100.4 F (38 °C) OR HIGHER °*CHILLS OR SWEATING °*NAUSEA AND VOMITING THAT IS NOT CONTROLLED WITH YOUR NAUSEA MEDICATION °*UNUSUAL SHORTNESS OF BREATH °*UNUSUAL BRUISING OR BLEEDING °*URINARY PROBLEMS (pain or burning when urinating, or frequent urination) °*BOWEL PROBLEMS (unusual diarrhea, constipation, pain near the anus) °TENDERNESS IN MOUTH AND THROAT WITH OR WITHOUT PRESENCE OF ULCERS (sore throat, sores in mouth, or a toothache) °UNUSUAL RASH, SWELLING OR PAIN  °UNUSUAL VAGINAL DISCHARGE OR ITCHING  ° °Items with * indicate a potential emergency and should be followed up as soon as possible or go to the Emergency Department if any problems should occur. ° °Please show the CHEMOTHERAPY ALERT CARD or IMMUNOTHERAPY ALERT CARD at check-in to the  Emergency Department and triage nurse. ° °Should you have questions after your visit or need to cancel or reschedule your appointment, please contact [facility]  Dept: [phone]  and follow the prompts.  Office hours are 8:00 a.m. to 4:30 p.m. Monday - Friday. Please note that voicemails left after 4:00 p.m. may not be returned until the following business day.  We are closed weekends and major holidays. You have access to a nurse at all times for urgent questions. Please call the main number to the clinic Dept: [phone] and follow the prompts. ° ° °For any non-urgent questions, you may also contact your provider using MyChart. We now offer e-Visits for anyone 18 and older to request care online for non-urgent symptoms. For details visit [email or web address]. °  °Also download the MyChart app! Go to the app store, search "MyChart", open the app, select , and log in with your MyChart username and password. ° °Due to Covid, a mask is required upon entering the hospital/clinic. If you do not have a mask, one will be given to you upon arrival. For doctor visits, patients may have 1 support person aged 18 or older with them. For treatment visits, patients cannot have anyone with them due to current Covid guidelines and our immunocompromised population.  ° °

## 2020-12-12 NOTE — Assessment & Plan Note (Signed)
She is noted to have consistent high blood pressure According to the patient, she does not check her blood pressure regularly and does not on the blood pressure machine Her recent echocardiogram show changes consistent with uncontrolled hypertension I recommend she start checking her blood pressure twice daily and to review with her primary care doctor about blood pressure management I told her uncontrolled hypertension can cause chronic renal failure

## 2020-12-14 ENCOUNTER — Telehealth: Payer: Self-pay | Admitting: Hematology and Oncology

## 2020-12-14 ENCOUNTER — Other Ambulatory Visit: Payer: Self-pay | Admitting: Hematology and Oncology

## 2020-12-14 DIAGNOSIS — C541 Malignant neoplasm of endometrium: Secondary | ICD-10-CM | POA: Diagnosis not present

## 2020-12-14 NOTE — Telephone Encounter (Signed)
Scheduled appointment per 05/31 sch msg. Patient will receive calender.

## 2020-12-20 DIAGNOSIS — H338 Other retinal detachments: Secondary | ICD-10-CM | POA: Diagnosis not present

## 2020-12-20 DIAGNOSIS — E039 Hypothyroidism, unspecified: Secondary | ICD-10-CM | POA: Diagnosis not present

## 2020-12-20 DIAGNOSIS — H26492 Other secondary cataract, left eye: Secondary | ICD-10-CM | POA: Diagnosis not present

## 2020-12-20 DIAGNOSIS — G479 Sleep disorder, unspecified: Secondary | ICD-10-CM | POA: Diagnosis not present

## 2020-12-20 DIAGNOSIS — H59812 Chorioretinal scars after surgery for detachment, left eye: Secondary | ICD-10-CM | POA: Diagnosis not present

## 2020-12-20 DIAGNOSIS — D849 Immunodeficiency, unspecified: Secondary | ICD-10-CM | POA: Diagnosis not present

## 2020-12-20 DIAGNOSIS — C7800 Secondary malignant neoplasm of unspecified lung: Secondary | ICD-10-CM | POA: Diagnosis not present

## 2020-12-20 DIAGNOSIS — I10 Essential (primary) hypertension: Secondary | ICD-10-CM | POA: Diagnosis not present

## 2020-12-20 DIAGNOSIS — E663 Overweight: Secondary | ICD-10-CM | POA: Diagnosis not present

## 2020-12-20 DIAGNOSIS — K219 Gastro-esophageal reflux disease without esophagitis: Secondary | ICD-10-CM | POA: Diagnosis not present

## 2020-12-20 DIAGNOSIS — Z961 Presence of intraocular lens: Secondary | ICD-10-CM | POA: Diagnosis not present

## 2020-12-20 DIAGNOSIS — H35372 Puckering of macula, left eye: Secondary | ICD-10-CM | POA: Diagnosis not present

## 2020-12-20 DIAGNOSIS — M1990 Unspecified osteoarthritis, unspecified site: Secondary | ICD-10-CM | POA: Diagnosis not present

## 2020-12-20 DIAGNOSIS — H33103 Unspecified retinoschisis, bilateral: Secondary | ICD-10-CM | POA: Diagnosis not present

## 2021-01-01 DIAGNOSIS — H1032 Unspecified acute conjunctivitis, left eye: Secondary | ICD-10-CM | POA: Diagnosis not present

## 2021-01-02 ENCOUNTER — Ambulatory Visit: Payer: Medicare HMO

## 2021-01-02 ENCOUNTER — Other Ambulatory Visit: Payer: Medicare HMO

## 2021-01-03 ENCOUNTER — Inpatient Hospital Stay: Payer: Medicare HMO

## 2021-01-03 ENCOUNTER — Other Ambulatory Visit: Payer: Self-pay

## 2021-01-03 ENCOUNTER — Inpatient Hospital Stay: Payer: Medicare HMO | Attending: Gynecologic Oncology

## 2021-01-03 VITALS — BP 143/67 | HR 91 | Temp 98.0°F | Resp 18 | Wt 136.8 lb

## 2021-01-03 DIAGNOSIS — C541 Malignant neoplasm of endometrium: Secondary | ICD-10-CM | POA: Diagnosis present

## 2021-01-03 DIAGNOSIS — C779 Secondary and unspecified malignant neoplasm of lymph node, unspecified: Secondary | ICD-10-CM | POA: Insufficient documentation

## 2021-01-03 DIAGNOSIS — Z5112 Encounter for antineoplastic immunotherapy: Secondary | ICD-10-CM | POA: Insufficient documentation

## 2021-01-03 LAB — COMPREHENSIVE METABOLIC PANEL
ALT: 19 U/L (ref 0–44)
AST: 26 U/L (ref 15–41)
Albumin: 3.9 g/dL (ref 3.5–5.0)
Alkaline Phosphatase: 68 U/L (ref 38–126)
Anion gap: 14 (ref 5–15)
BUN: 23 mg/dL (ref 8–23)
CO2: 24 mmol/L (ref 22–32)
Calcium: 9.7 mg/dL (ref 8.9–10.3)
Chloride: 103 mmol/L (ref 98–111)
Creatinine, Ser: 1.37 mg/dL — ABNORMAL HIGH (ref 0.44–1.00)
GFR, Estimated: 39 mL/min — ABNORMAL LOW (ref >60)
Glucose, Bld: 116 mg/dL — ABNORMAL HIGH (ref 70–99)
Potassium: 3.8 mmol/L (ref 3.5–5.1)
Sodium: 141 mmol/L (ref 135–145)
Total Bilirubin: 0.4 mg/dL (ref 0.3–1.2)
Total Protein: 7.1 g/dL (ref 6.5–8.1)

## 2021-01-03 LAB — CBC WITH DIFFERENTIAL/PLATELET
Abs Immature Granulocytes: 0.03 10*3/uL (ref 0.00–0.07)
Basophils Absolute: 0.1 10*3/uL (ref 0.0–0.1)
Basophils Relative: 1 %
Eosinophils Absolute: 0.3 10*3/uL (ref 0.0–0.5)
Eosinophils Relative: 4 %
HCT: 31.4 % — ABNORMAL LOW (ref 36.0–46.0)
Hemoglobin: 11.5 g/dL — ABNORMAL LOW (ref 12.0–15.0)
Immature Granulocytes: 0 %
Lymphocytes Relative: 26 %
Lymphs Abs: 2 10*3/uL (ref 0.7–4.0)
MCH: 32.4 pg (ref 26.0–34.0)
MCHC: 36.6 g/dL — ABNORMAL HIGH (ref 30.0–36.0)
MCV: 88.5 fL (ref 80.0–100.0)
Monocytes Absolute: 0.6 10*3/uL (ref 0.1–1.0)
Monocytes Relative: 8 %
Neutro Abs: 4.6 10*3/uL (ref 1.7–7.7)
Neutrophils Relative %: 61 %
Platelets: 234 10*3/uL (ref 150–400)
RBC: 3.55 MIL/uL — ABNORMAL LOW (ref 3.87–5.11)
RDW: 11.7 % (ref 11.5–15.5)
WBC: 7.6 10*3/uL (ref 4.0–10.5)
nRBC: 0 % (ref 0.0–0.2)

## 2021-01-03 MED ORDER — SODIUM CHLORIDE 0.9% FLUSH
10.0000 mL | INTRAVENOUS | Status: DC | PRN
  Administered 2021-01-03: 10 mL
  Filled 2021-01-03: qty 10

## 2021-01-03 MED ORDER — DIPHENHYDRAMINE HCL 25 MG PO CAPS
ORAL_CAPSULE | ORAL | Status: AC
  Filled 2021-01-03: qty 1

## 2021-01-03 MED ORDER — ACETAMINOPHEN 325 MG PO TABS
ORAL_TABLET | ORAL | Status: AC
  Filled 2021-01-03: qty 2

## 2021-01-03 MED ORDER — SODIUM CHLORIDE 0.9 % IV SOLN
Freq: Once | INTRAVENOUS | Status: AC
  Filled 2021-01-03: qty 250

## 2021-01-03 MED ORDER — ACETAMINOPHEN 325 MG PO TABS
650.0000 mg | ORAL_TABLET | Freq: Once | ORAL | Status: AC
  Administered 2021-01-03: 650 mg via ORAL

## 2021-01-03 MED ORDER — DIPHENHYDRAMINE HCL 25 MG PO TABS
25.0000 mg | ORAL_TABLET | Freq: Once | ORAL | Status: AC
  Administered 2021-01-03: 25 mg via ORAL
  Filled 2021-01-03: qty 1

## 2021-01-03 MED ORDER — HEPARIN SOD (PORK) LOCK FLUSH 100 UNIT/ML IV SOLN
500.0000 [IU] | Freq: Once | INTRAVENOUS | Status: AC | PRN
  Administered 2021-01-03: 500 [IU]
  Filled 2021-01-03: qty 5

## 2021-01-03 MED ORDER — SODIUM CHLORIDE 0.9% FLUSH
10.0000 mL | Freq: Once | INTRAVENOUS | Status: AC
Start: 2021-01-03 — End: 2021-01-03
  Administered 2021-01-03: 10 mL
  Filled 2021-01-03: qty 10

## 2021-01-03 MED ORDER — TRASTUZUMAB-ANNS CHEMO 150 MG IV SOLR
6.0000 mg/kg | Freq: Once | INTRAVENOUS | Status: AC
  Administered 2021-01-03: 378 mg via INTRAVENOUS
  Filled 2021-01-03: qty 18

## 2021-01-03 NOTE — Patient Instructions (Signed)
[facility]  Discharge Instructions: Thank you for choosing [facility] to provide your oncology and hematology care.   If you have a lab appointment with the Cancer Center, please go directly to the Cancer Center and check in at the registration area.   Wear comfortable clothing and clothing appropriate for easy access to any Portacath or PICC line.   We strive to give you quality time with your provider. You may need to reschedule your appointment if you arrive late (15 or more minutes).  Arriving late affects you and other patients whose appointments are after yours.  Also, if you miss three or more appointments without notifying the office, you may be dismissed from the clinic at the provider's discretion.      For prescription refill requests, have your pharmacy contact our office and allow 72 hours for refills to be completed.    Today you received the following chemotherapy and/or immunotherapy agents Kanjinti      To help prevent nausea and vomiting after your treatment, we encourage you to take your nausea medication as directed.  BELOW ARE SYMPTOMS THAT SHOULD BE REPORTED IMMEDIATELY: *FEVER GREATER THAN 100.4 F (38 C) OR HIGHER *CHILLS OR SWEATING *NAUSEA AND VOMITING THAT IS NOT CONTROLLED WITH YOUR NAUSEA MEDICATION *UNUSUAL SHORTNESS OF BREATH *UNUSUAL BRUISING OR BLEEDING *URINARY PROBLEMS (pain or burning when urinating, or frequent urination) *BOWEL PROBLEMS (unusual diarrhea, constipation, pain near the anus) TENDERNESS IN MOUTH AND THROAT WITH OR WITHOUT PRESENCE OF ULCERS (sore throat, sores in mouth, or a toothache) UNUSUAL RASH, SWELLING OR PAIN  UNUSUAL VAGINAL DISCHARGE OR ITCHING   Items with * indicate a potential emergency and should be followed up as soon as possible or go to the Emergency Department if any problems should occur.  Please show the CHEMOTHERAPY ALERT CARD or IMMUNOTHERAPY ALERT CARD at check-in to  the Emergency Department and triage nurse.  Should you have questions after your visit or need to cancel or reschedule your appointment, please contact [facility]  Dept: [phone]  and follow the prompts.  Office hours are 8:00 a.m. to 4:30 p.m. Monday - Friday. Please note that voicemails left after 4:00 p.m. may not be returned until the following business day.  We are closed weekends and major holidays. You have access to a nurse at all times for urgent questions. Please call the main number to the clinic Dept: [phone] and follow the prompts.   For any non-urgent questions, you may also contact your provider using MyChart. We now offer e-Visits for anyone 18 and older to request care online for non-urgent symptoms. For details visit [email or web address].   Also download the MyChart app! Go to the app store, search "MyChart", open the app, select [facility], and log in with your MyChart username and password.  Due to Covid, a mask is required upon entering the hospital/clinic. If you do not have a mask, one will be given to you upon arrival. For doctor visits, patients may have 1 support person aged 18 or older with them. For treatment visits, patients cannot have anyone with them due to current Covid guidelines and our immunocompromised population.   Trastuzumab injection for infusion What is this medication? TRASTUZUMAB (tras TOO zoo mab) is a monoclonal antibody. It is used to treat breast cancer and stomach cancer. This medicine may be used for other purposes; ask your health care provider or pharmacist if you have questions. COMMON BRAND NAME(S):   [name], Herzuma, KANJINTI, Ogivri, Ontruzant, Trazimera What should I tell my care team before I take this medication? They need to know if you have any of these conditions: heart disease heart failure lung or breathing disease, like asthma an unusual or allergic reaction to trastuzumab, benzyl  alcohol, or other medications, foods, dyes, or preservatives pregnant or trying to get pregnant breast-feeding How should I use this medication? This drug is given as an infusion into a vein. It is administered in a hospital or clinic by a specially trained health care professional. Talk to your pediatrician regarding the use of this medicine in children. This medicine is not approved for use in children. Overdosage: If you think you have taken too much of this medicine contact a poison control center or emergency room at once. NOTE: This medicine is only for you. Do not share this medicine with others. What if I miss a dose? It is important not to miss a dose. Call your doctor or health care professional if you are unable to keep an appointment. What may interact with this medication? This medicine may interact with the following medications: certain types of chemotherapy, such as daunorubicin, doxorubicin, epirubicin, and idarubicin This list may not describe all possible interactions. Give your health care provider a list of all the medicines, herbs, non-prescription drugs, or dietary supplements you use. Also tell them if you smoke, drink alcohol, or use illegal drugs. Some items may interact with your medicine. What should I watch for while using this medication? Visit your doctor for checks on your progress. Report any side effects. Continue your course of treatment even though you feel ill unless your doctor tells you to stop. Call your doctor or health care professional for advice if you get a fever, chills or sore throat, or other symptoms of a cold or flu. Do not treat yourself. Try to avoid being around people who are sick. You may experience fever, chills and shaking during your first infusion. These effects are usually mild and can be treated with other medicines. Report any side effects during the infusion to your health care professional. Fever and chills usually do not happen with  later infusions. Do not become pregnant while taking this medicine or for 7 months after stopping it. Women should inform their doctor if they wish to become pregnant or think they might be pregnant. Women of child-bearing potential will need to have a negative pregnancy test before starting this medicine. There is a potential for serious side effects to an unborn child. Talk to your health care professional or pharmacist for more information. Do not breast-feed an infant while taking this medicine or for 7 months after stopping it. Women must use effective birth control with this medicine. What side effects may I notice from receiving this medication? Side effects that you should report to your doctor or health care professional as soon as possible: allergic reactions like skin rash, itching or hives, swelling of the face, lips, or tongue chest pain or palpitations cough dizziness feeling faint or lightheaded, falls fever general ill feeling or flu-like symptoms signs of worsening heart failure like breathing problems; swelling in your legs and feet unusually weak or tired Side effects that usually do not require medical attention (report to your doctor or health care professional if they continue or are bothersome): bone pain changes in taste diarrhea joint pain nausea/vomiting weight loss This list may not describe all possible side effects. Call your doctor for medical advice about side effects.   You may report side effects to FDA at 1-800-FDA-1088. Where should I keep my medication? This drug is given in a hospital or clinic and will not be stored at home. NOTE: This sheet is a summary. It may not cover all possible information. If you have questions about this medicine, talk to your doctor, pharmacist, or health care provider.  2022 Elsevier/Gold Standard (2016-06-25 14:37:52)   

## 2021-01-11 DIAGNOSIS — L821 Other seborrheic keratosis: Secondary | ICD-10-CM | POA: Diagnosis not present

## 2021-01-11 DIAGNOSIS — L570 Actinic keratosis: Secondary | ICD-10-CM | POA: Diagnosis not present

## 2021-01-11 DIAGNOSIS — Z85828 Personal history of other malignant neoplasm of skin: Secondary | ICD-10-CM | POA: Diagnosis not present

## 2021-01-12 ENCOUNTER — Encounter: Payer: Self-pay | Admitting: Hematology and Oncology

## 2021-01-18 DIAGNOSIS — N1832 Chronic kidney disease, stage 3b: Secondary | ICD-10-CM | POA: Diagnosis not present

## 2021-01-18 DIAGNOSIS — I129 Hypertensive chronic kidney disease with stage 1 through stage 4 chronic kidney disease, or unspecified chronic kidney disease: Secondary | ICD-10-CM | POA: Diagnosis not present

## 2021-01-23 ENCOUNTER — Inpatient Hospital Stay: Payer: Medicare HMO | Admitting: Hematology and Oncology

## 2021-01-23 ENCOUNTER — Other Ambulatory Visit: Payer: Self-pay

## 2021-01-23 ENCOUNTER — Inpatient Hospital Stay: Payer: Medicare HMO | Attending: Gynecologic Oncology

## 2021-01-23 ENCOUNTER — Encounter: Payer: Self-pay | Admitting: Hematology and Oncology

## 2021-01-23 ENCOUNTER — Telehealth: Payer: Self-pay

## 2021-01-23 ENCOUNTER — Inpatient Hospital Stay: Payer: Medicare HMO

## 2021-01-23 VITALS — BP 117/57 | HR 72 | Temp 97.4°F | Resp 18 | Ht 62.2 in | Wt 135.2 lb

## 2021-01-23 DIAGNOSIS — Z90710 Acquired absence of both cervix and uterus: Secondary | ICD-10-CM | POA: Insufficient documentation

## 2021-01-23 DIAGNOSIS — D539 Nutritional anemia, unspecified: Secondary | ICD-10-CM | POA: Diagnosis not present

## 2021-01-23 DIAGNOSIS — Z5112 Encounter for antineoplastic immunotherapy: Secondary | ICD-10-CM | POA: Diagnosis present

## 2021-01-23 DIAGNOSIS — R918 Other nonspecific abnormal finding of lung field: Secondary | ICD-10-CM

## 2021-01-23 DIAGNOSIS — C779 Secondary and unspecified malignant neoplasm of lymph node, unspecified: Secondary | ICD-10-CM | POA: Diagnosis present

## 2021-01-23 DIAGNOSIS — C773 Secondary and unspecified malignant neoplasm of axilla and upper limb lymph nodes: Secondary | ICD-10-CM

## 2021-01-23 DIAGNOSIS — D631 Anemia in chronic kidney disease: Secondary | ICD-10-CM | POA: Diagnosis not present

## 2021-01-23 DIAGNOSIS — I10 Essential (primary) hypertension: Secondary | ICD-10-CM

## 2021-01-23 DIAGNOSIS — N183 Chronic kidney disease, stage 3 unspecified: Secondary | ICD-10-CM | POA: Diagnosis not present

## 2021-01-23 DIAGNOSIS — C541 Malignant neoplasm of endometrium: Secondary | ICD-10-CM

## 2021-01-23 DIAGNOSIS — Z7982 Long term (current) use of aspirin: Secondary | ICD-10-CM | POA: Insufficient documentation

## 2021-01-23 DIAGNOSIS — Z79899 Other long term (current) drug therapy: Secondary | ICD-10-CM | POA: Diagnosis not present

## 2021-01-23 LAB — CBC WITH DIFFERENTIAL/PLATELET
Abs Immature Granulocytes: 0.03 10*3/uL (ref 0.00–0.07)
Basophils Absolute: 0.1 10*3/uL (ref 0.0–0.1)
Basophils Relative: 1 %
Eosinophils Absolute: 0.2 10*3/uL (ref 0.0–0.5)
Eosinophils Relative: 3 %
HCT: 29.4 % — ABNORMAL LOW (ref 36.0–46.0)
Hemoglobin: 10.7 g/dL — ABNORMAL LOW (ref 12.0–15.0)
Immature Granulocytes: 0 %
Lymphocytes Relative: 18 %
Lymphs Abs: 1.3 10*3/uL (ref 0.7–4.0)
MCH: 31.8 pg (ref 26.0–34.0)
MCHC: 36.4 g/dL — ABNORMAL HIGH (ref 30.0–36.0)
MCV: 87.2 fL (ref 80.0–100.0)
Monocytes Absolute: 0.6 10*3/uL (ref 0.1–1.0)
Monocytes Relative: 8 %
Neutro Abs: 5.1 10*3/uL (ref 1.7–7.7)
Neutrophils Relative %: 70 %
Platelets: 228 10*3/uL (ref 150–400)
RBC: 3.37 MIL/uL — ABNORMAL LOW (ref 3.87–5.11)
RDW: 12 % (ref 11.5–15.5)
WBC: 7.4 10*3/uL (ref 4.0–10.5)
nRBC: 0 % (ref 0.0–0.2)

## 2021-01-23 LAB — COMPREHENSIVE METABOLIC PANEL
ALT: 18 U/L (ref 0–44)
AST: 22 U/L (ref 15–41)
Albumin: 3.8 g/dL (ref 3.5–5.0)
Alkaline Phosphatase: 67 U/L (ref 38–126)
Anion gap: 12 (ref 5–15)
BUN: 21 mg/dL (ref 8–23)
CO2: 27 mmol/L (ref 22–32)
Calcium: 9.6 mg/dL (ref 8.9–10.3)
Chloride: 99 mmol/L (ref 98–111)
Creatinine, Ser: 1.37 mg/dL — ABNORMAL HIGH (ref 0.44–1.00)
GFR, Estimated: 39 mL/min — ABNORMAL LOW (ref >60)
Glucose, Bld: 123 mg/dL — ABNORMAL HIGH (ref 70–99)
Potassium: 3.9 mmol/L (ref 3.5–5.1)
Sodium: 138 mmol/L (ref 135–145)
Total Bilirubin: 0.4 mg/dL (ref 0.3–1.2)
Total Protein: 7 g/dL (ref 6.5–8.1)

## 2021-01-23 MED ORDER — SODIUM CHLORIDE 0.9 % IV SOLN
Freq: Once | INTRAVENOUS | Status: AC
  Filled 2021-01-23: qty 250

## 2021-01-23 MED ORDER — TRASTUZUMAB-ANNS CHEMO 150 MG IV SOLR
6.0000 mg/kg | Freq: Once | INTRAVENOUS | Status: AC
  Administered 2021-01-23: 378 mg via INTRAVENOUS
  Filled 2021-01-23: qty 18

## 2021-01-23 MED ORDER — DIPHENHYDRAMINE HCL 25 MG PO CAPS
25.0000 mg | ORAL_CAPSULE | Freq: Once | ORAL | Status: AC
  Administered 2021-01-23: 25 mg via ORAL

## 2021-01-23 MED ORDER — DIPHENHYDRAMINE HCL 25 MG PO CAPS
ORAL_CAPSULE | ORAL | Status: AC
  Filled 2021-01-23: qty 1

## 2021-01-23 MED ORDER — ACETAMINOPHEN 325 MG PO TABS
ORAL_TABLET | ORAL | Status: AC
  Filled 2021-01-23: qty 2

## 2021-01-23 MED ORDER — SODIUM CHLORIDE 0.9% FLUSH
10.0000 mL | INTRAVENOUS | Status: DC | PRN
  Administered 2021-01-23: 10 mL
  Filled 2021-01-23: qty 10

## 2021-01-23 MED ORDER — SODIUM CHLORIDE 0.9% FLUSH
10.0000 mL | Freq: Once | INTRAVENOUS | Status: AC
Start: 2021-01-23 — End: 2021-01-23
  Administered 2021-01-23: 10 mL
  Filled 2021-01-23: qty 10

## 2021-01-23 MED ORDER — HEPARIN SOD (PORK) LOCK FLUSH 100 UNIT/ML IV SOLN
500.0000 [IU] | Freq: Once | INTRAVENOUS | Status: AC | PRN
  Administered 2021-01-23: 500 [IU]
  Filled 2021-01-23: qty 5

## 2021-01-23 MED ORDER — ACETAMINOPHEN 325 MG PO TABS
650.0000 mg | ORAL_TABLET | Freq: Once | ORAL | Status: AC
  Administered 2021-01-23: 650 mg via ORAL

## 2021-01-23 NOTE — Patient Instructions (Signed)
[facility]  Discharge Instructions: Thank you for choosing [facility] to provide your oncology and hematology care.   If you have a lab appointment with the Cancer Center, please go directly to the Cancer Center and check in at the registration area.   Wear comfortable clothing and clothing appropriate for easy access to any Portacath or PICC line.   We strive to give you quality time with your provider. You may need to reschedule your appointment if you arrive late (15 or more minutes).  Arriving late affects you and other patients whose appointments are after yours.  Also, if you miss three or more appointments without notifying the office, you may be dismissed from the clinic at the provider's discretion.      For prescription refill requests, have your pharmacy contact our office and allow 72 hours for refills to be completed.    Today you received the following chemotherapy and/or immunotherapy agents Kanjinti      To help prevent nausea and vomiting after your treatment, we encourage you to take your nausea medication as directed.  BELOW ARE SYMPTOMS THAT SHOULD BE REPORTED IMMEDIATELY: *FEVER GREATER THAN 100.4 F (38 C) OR HIGHER *CHILLS OR SWEATING *NAUSEA AND VOMITING THAT IS NOT CONTROLLED WITH YOUR NAUSEA MEDICATION *UNUSUAL SHORTNESS OF BREATH *UNUSUAL BRUISING OR BLEEDING *URINARY PROBLEMS (pain or burning when urinating, or frequent urination) *BOWEL PROBLEMS (unusual diarrhea, constipation, pain near the anus) TENDERNESS IN MOUTH AND THROAT WITH OR WITHOUT PRESENCE OF ULCERS (sore throat, sores in mouth, or a toothache) UNUSUAL RASH, SWELLING OR PAIN  UNUSUAL VAGINAL DISCHARGE OR ITCHING   Items with * indicate a potential emergency and should be followed up as soon as possible or go to the Emergency Department if any problems should occur.  Please show the CHEMOTHERAPY ALERT CARD or IMMUNOTHERAPY ALERT CARD at check-in to  the Emergency Department and triage nurse.  Should you have questions after your visit or need to cancel or reschedule your appointment, please contact [facility]  Dept: [phone]  and follow the prompts.  Office hours are 8:00 a.m. to 4:30 p.m. Monday - Friday. Please note that voicemails left after 4:00 p.m. may not be returned until the following business day.  We are closed weekends and major holidays. You have access to a nurse at all times for urgent questions. Please call the main number to the clinic Dept: [phone] and follow the prompts.   For any non-urgent questions, you may also contact your provider using MyChart. We now offer e-Visits for anyone 18 and older to request care online for non-urgent symptoms. For details visit [email or web address].   Also download the MyChart app! Go to the app store, search "MyChart", open the app, select [facility], and log in with your MyChart username and password.  Due to Covid, a mask is required upon entering the hospital/clinic. If you do not have a mask, one will be given to you upon arrival. For doctor visits, patients may have 1 support person aged 18 or older with them. For treatment visits, patients cannot have anyone with them due to current Covid guidelines and our immunocompromised population.   Trastuzumab injection for infusion What is this medication? TRASTUZUMAB (tras TOO zoo mab) is a monoclonal antibody. It is used to treat breast cancer and stomach cancer. This medicine may be used for other purposes; ask your health care provider or pharmacist if you have questions. COMMON BRAND NAME(S):   [name], Herzuma, KANJINTI, Ogivri, Ontruzant, Trazimera What should I tell my care team before I take this medication? They need to know if you have any of these conditions: heart disease heart failure lung or breathing disease, like asthma an unusual or allergic reaction to trastuzumab, benzyl  alcohol, or other medications, foods, dyes, or preservatives pregnant or trying to get pregnant breast-feeding How should I use this medication? This drug is given as an infusion into a vein. It is administered in a hospital or clinic by a specially trained health care professional. Talk to your pediatrician regarding the use of this medicine in children. This medicine is not approved for use in children. Overdosage: If you think you have taken too much of this medicine contact a poison control center or emergency room at once. NOTE: This medicine is only for you. Do not share this medicine with others. What if I miss a dose? It is important not to miss a dose. Call your doctor or health care professional if you are unable to keep an appointment. What may interact with this medication? This medicine may interact with the following medications: certain types of chemotherapy, such as daunorubicin, doxorubicin, epirubicin, and idarubicin This list may not describe all possible interactions. Give your health care provider a list of all the medicines, herbs, non-prescription drugs, or dietary supplements you use. Also tell them if you smoke, drink alcohol, or use illegal drugs. Some items may interact with your medicine. What should I watch for while using this medication? Visit your doctor for checks on your progress. Report any side effects. Continue your course of treatment even though you feel ill unless your doctor tells you to stop. Call your doctor or health care professional for advice if you get a fever, chills or sore throat, or other symptoms of a cold or flu. Do not treat yourself. Try to avoid being around people who are sick. You may experience fever, chills and shaking during your first infusion. These effects are usually mild and can be treated with other medicines. Report any side effects during the infusion to your health care professional. Fever and chills usually do not happen with  later infusions. Do not become pregnant while taking this medicine or for 7 months after stopping it. Women should inform their doctor if they wish to become pregnant or think they might be pregnant. Women of child-bearing potential will need to have a negative pregnancy test before starting this medicine. There is a potential for serious side effects to an unborn child. Talk to your health care professional or pharmacist for more information. Do not breast-feed an infant while taking this medicine or for 7 months after stopping it. Women must use effective birth control with this medicine. What side effects may I notice from receiving this medication? Side effects that you should report to your doctor or health care professional as soon as possible: allergic reactions like skin rash, itching or hives, swelling of the face, lips, or tongue chest pain or palpitations cough dizziness feeling faint or lightheaded, falls fever general ill feeling or flu-like symptoms signs of worsening heart failure like breathing problems; swelling in your legs and feet unusually weak or tired Side effects that usually do not require medical attention (report to your doctor or health care professional if they continue or are bothersome): bone pain changes in taste diarrhea joint pain nausea/vomiting weight loss This list may not describe all possible side effects. Call your doctor for medical advice about side effects.   You may report side effects to FDA at 1-800-FDA-1088. Where should I keep my medication? This drug is given in a hospital or clinic and will not be stored at home. NOTE: This sheet is a summary. It may not cover all possible information. If you have questions about this medicine, talk to your doctor, pharmacist, or health care provider.  2022 [employer]/Gold Standard (2016-06-25 14:37:52)   

## 2021-01-23 NOTE — Assessment & Plan Note (Signed)
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

## 2021-01-23 NOTE — Assessment & Plan Note (Signed)
Renal function is stable. Monitor closely She was seen by nephrologist recently 

## 2021-01-23 NOTE — Patient Instructions (Signed)

## 2021-01-23 NOTE — Assessment & Plan Note (Signed)
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 

## 2021-01-23 NOTE — Progress Notes (Signed)
[place] OFFICE PROGRESS NOTE  Patient Care Team: Burnard Bunting, [name]  ASSESSMENT & PLAN:  Endometrial cancer [facility]) She tolerated single agent trastuzumab well She has no signs of cancer recurrence I recommend continued imaging study every 6 months, next would be due around September I plan to continue echocardiogram monitoring every 3 months, next due in August-September With stability of her imaging study which showed no reoccurrence in her pelvic region, apparent only CT imaging of the chest without contrast in her next visit   CKD (chronic kidney disease), stage III ([place]) Renal function is stable. Monitor closely She was seen by nephrologist recently  Deficiency anemia She has multifactorial anemia, anemia secondary to prior chemotherapy as well as chronic kidney disease Anemia is improving/stable Recent vitamin B12 level was adequate She is not symptomatic Observe only  Orders Placed This Encounter  Procedures   CT CHEST WO CONTRAST    Standing Status:   Future    Standing Expiration Date:   01/23/2022    Order Specific Question:   Preferred imaging location?    Answer:   [facility]    Order Specific Question:   Radiology Contrast Protocol - do NOT remove file path    Answer:   [email or web address]   ECHOCARDIOGRAM COMPLETE    Standing Status:   Future    Standing Expiration Date:   01/23/2022    Order Specific Question:   Where should this test be performed    Answer:   [facility]    Order Specific Question:   Perflutren DEFINITY (image enhancing agent) should be administered unless hypersensitivity or allergy exist    Answer:   Administer Perflutren    Order Specific Question:   Reason for exam-Echo    Answer:   Chemo  [id number]    All questions were answered. The patient knows to call the clinic with any problems, questions or concerns. The total time spent in the  appointment was 20 minutes encounter with patients including review of chart and various tests results, discussions about plan of care and coordination of care plan   [name], MD 01/23/2021 12:46 PM  INTERVAL HISTORY: Please see below for problem oriented charting. She returns for [name] infusion She is doing well No side effects from treatment Denies leg swelling, chest pain or shortness of breath No new lymphadenopathy  SUMMARY OF ONCOLOGIC HISTORY: Oncology History Overview Note  Vicki Cervantes  has a remote history of left  breast cancer at age 80 but received BRCA testing approximately 5 years ago which was negative. Her cancer was treated with surgery but no radiation or chemotherapy.   Serous endometrial cancer, MSI stable ER positive, PR neg, Her2/neu 3+       Endometrial cancer ([place])  08/29/2016 Pathology Results   Endometrium, biopsy - HIGH GRADE ENDOMETRIAL CARCINOMA, SEE COMMENT. Microscopic Comment The sections show multiple fragments of adenocarcinoma displaying glandular and papillary patterns associated with high grade cytomorphology characterized by nuclear pleomorphism, prominent nucleoli and brisk mitosis. Immunohistochemical stains show that the tumor cells are positive for vimentin, p16, p53 with increased Ki-67 expression. Estrogen and progesterone receptor stains show patchy weak positivity. No significant positivity is seen with CEA. The findings are consistent with high grade endometrial carcinoma and the overall morphology and phenotypic features favor serous carcinoma.    08/29/2016 Initial Diagnosis   She presented with postmenopausal bleeding    10/03/2016 Imaging   CT C/A/P 09/2016 IMPRESSION:  1. Thickening of the endometrial canal up to 19 mm in fundus, presumably corresponding to the patient's reported endometrial carcinoma. 2. Multiple tiny pulmonary nodules scattered throughout the lungs bilaterally measuring 4 mm or less in size. Nodules of  this size are typically considered statistically likely benign. In the setting of known primary malignancy, metastatic disease to the lungs is not excluded, but is not strongly favored on today's examination. Attention on followup studies is recommended to ensure the stability or resolution of these nodules. 3. Subcentimeter low-attenuation lesion in the central aspect of segment 8 of the liver is too small to characterize. This is statistically likely a tiny cyst, but warrants attention on follow-up studies to exclude the possibility of a solitary hepatic metastasis. 4. 1.5 x 1.5 x 1.7 cm well-circumscribed lesion in the proximal stomach. This is of uncertain etiology and significance, and could represent a benign gastric polyp, however, further evaluation with nonemergent endoscopy is suggested in the near future for further evaluation. 5. **An incidental finding of potential clinical significance has been found. 1.1 x 1.6 cm thyroid nodule in the inferior aspect of the right lobe of the thyroid gland. Follow-up evaluation with nonemergent thyroid ultrasound is recommended in the near future to better evaluate this finding. This recommendation follows ACR consensuss guidelines: Managing Incidental Thyroid Nodules Detected on Imaging: White Paper of the ACR Incidental Thyroid Findings Committee. J Am Coll Radiol 2015;12(2):143-150.** 6. Aortic atherosclerosis, in addition to left anterior descending coronary artery disease    10/22/2016 Surgery   Robotic assisted total hysterectomy, BSO and bilateral pelvic lymphadenectomy  Final pathology revealed a 3cm polyp containing serous carcinoma but with no myometrial invasion, no LVSI and negative nodes.  Stage IA Uterine serous cancer    10/22/2016 Pathology Results   1. Lymph nodes, regional resection, right pelvic - SIX BENIGN LYMPH NODES (0/6). 2. Lymph nodes, regional resection, left pelvic - SEVEN BENIGN LYMPH NODES (0/7). 3. Uterus +/-  tubes/ovaries, neoplastic, with right ovary and fallopian tube ENDOMYOMETRIUM - SEROUS CARCINOMA ARISING WITHIN AN ENDOMETRIAL POLYP - NO MYOMETRIAL INVASION IDENTIFIED - ADENOMYOSIS - LEIOMYOMA (1 CM) - SEE ONCOLOGY TABLE AND COMMENT CERVIX - CARCINOMA FOCALLY INVOLVES ENDOCERVICAL GLANDS - NABOTHIAN CYSTS RIGHT ADNEXA - BENIGN OVARY AND FALLOPIAN TUBE - NO CARCINOMA IDENTIFIED 4. Cul-de-sac biopsy - MESOTHELIAL HYPERPLASIA Microscopic Comment 3. ONCOLOGY TABLE-UTERUS, CARCINOMA OR CARCINOSARCOMA Specimen: Uterus, right fallopian tube and ovary Procedure: Total hysterectomy and right salpingo-oophorectomy Lymph node sampling performed: Bilateral pelvic regional resection Specimen integrity: Intact Maximum tumor size: 3 cm (polyp) Histologic type: Serous carcinoma Grade: High grade Myometrial invasion: Not identified Cervical stromal involvement: No, focal endocervical gland involvement Extent of involvement of other organs: Not identified Lymph - vascular invasion: Not identified Peritoneal washings: N/A Lymph nodes: Examined: 0 Sentinel 13 Non-sentinel 13 Total Lymph nodes with metastasis: 0 Isolated tumor cells (< 0.2 mm): 0 Micrometastasis: (> 0.2 mm and < 2.0 mm): 0 Macrometastasis: (> 2.0 mm): 0 Extracapsular extension: N/A Pelvic lymph nodes: 0 involved of 13 lymph nodes. Para-aortic lymph nodes: No para-aortic nodes submitted TNM code: pT1a, pNX FIGO Stage (based on pathologic findings, needs clinical correlation): IA Comment: Immunohistochemistry for cytokeratin AE1/AE3 is performed on all of the lymph nodes (parts 1 & 2) and no metastatic carcinoma is identified.    07/04/2017 Genetic Testing   The patient had genetic testing due to a personal history of breast and uterine cancer, and a family history of stomach cancer.  The Multi-Cancer Panel was ordered. The Multi-Cancer Panel  offered by [facility] includes sequencing and/or deletion duplication testing of the  following 83 genes: ALK, APC, ATM, AXIN2,BAP1,  BARD1, BLM, BMPR1A, BRCA1, BRCA2, BRIP1, CASR, CDC73, CDH1, CDK4, CDKN1B, CDKN1C, CDKN2A (p14ARF), CDKN2A (p16INK4a), CEBPA, CHEK2, CTNNA1, DICER1, DIS3L2, EGFR (c.2369C>T, p.Thr790Met variant only), EPCAM (Deletion/duplication testing only), FH, FLCN, GATA2, GPC3, GREM1 (Promoter region deletion/duplication testing only), HOXB13 (c.251G>A, p.Gly84Glu), HRAS, KIT, MAX, MEN1, MET, MITF (c.952G>A, p.Glu318Lys variant only), MLH1, MSH2, MSH3, MSH6, MUTYH, NBN, NF1, NF2, NTHL1, PALB2, PDGFRA, PHOX2B, PMS2, POLD1, POLE, POT1, PRKAR1A, PTCH1, PTEN, RAD50, RAD51C, RAD51D, RB1, RECQL4, RET, RUNX1, SDHAF2, SDHA (sequence changes only), SDHB, SDHC, SDHD, SMAD4, SMARCA4, SMARCB1, SMARCE1, STK11, SUFU, TERC, TERT, TMEM127, TP53, TSC1, TSC2, VHL, WRN and WT1.   Results: No pathogenic variants were identified.  A variant of uncertain significance in the gene APC was identified.  c.791A>G (p.Gln264Arg).  The date of this test report is 07/04/2017.      Genetic Testing   Patient has genetic testing done for MSI. 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     12/03/2017 Imaging   CT Chest/Abd/Pelvis to follow pulmonary nodule and gastric mass IMPRESSION: 1. Stable CT of the chest. Small pulmonary nodules are unchanged when compared with previous exam. 2. No new findings identified. 3. Subcentimeter low-attenuation lesions within the liver are remain too small to characterize but are stable from prior exam. 4. Persistent indeterminate low-attenuation structure within the proximal stomach is unchanged measuring 1.4 cm. Correlation with direct visualization is advised    06/30/2018 Imaging   CT CHEST Lungs/Pleura: Stable scattered sub-cm pulmonary nodules are again seen bilaterally and are stable compared to previous studies. No new or enlarging pulmonary nodules or masses identified. No evidence of pulmonary infiltrate or pleural  effusion.    08/19/2018 Echocardiogram   [place] done in [place]: EF 60-65%. Mild impaired relaxation. (report scanned)    09/17/2018 Relapse/Recurrence   Presented with c/o vagina discharge.  Lesion noted at the left vaginal apex 78m lesion removed Path c/w high grade serous cancer    09/17/2018 Pathology Results   Vagina, biopsy, left apex - HIGH GRADE SEROUS CARCINOMA.    09/28/2018 PET scan   1. Two hypermetabolic axial lymph nodes. Unusual site for metastatic endometrial carcinoma however the activity is more intense than typically seen in reactive adenopathy. Suggest ultrasound-guided percutaneous biopsy of the larger RIGHT axial lymph node 2. No evidence of local recurrence at the vaginal cuff.  3. No metastatic adenopathy in the abdomen or pelvis. 4. Stable small pulmonary nodules    10/09/2018 Pathology Results   Lymph node, needle/core biopsy, right axilla - METASTATIC CARCINOMA, SEE COMMENT. Microscopic Comment The carcinoma appears high grade. Immunohistochemistry is positive for cytokeratin 7, PAX-8, and ER. Cytokeratin 20, CDX-2, PR, and GATA-3 are negative. The findings along with the history are consistent with a gynecologic primary.     10/09/2018 Procedure   Ultrasound-guided core biopsies of a right axillary lymph node.    10/14/2018 Cancer Staging   Staging form: Corpus Uteri - Carcinoma and Carcinosarcoma, AJCC 8th Edition - Clinical: Stage IVB (cT1, cN0, pM1) - Signed by GHeath Lark MD on 10/14/2018    10/19/2018 Imaging   Placement of single lumen port a cath via right internal jugular vein. The catheter tip lies at the cavo-atrial junction. A power injectable port a cath was placed and is ready for immediate use.     10/21/2018 -  Chemotherapy   The patient had carboplatin and taxol  for treatment    11/11/2018 -  Chemotherapy      Patient is on Antibody Plan: BREAST TRASTUZUMAB Q21D     12/21/2018 PET scan   1. Interval resolution of hypermetabolic right  axillary lymph nodes. No metabolic findings highly suspicious for recurrent metastatic disease. 2. New mild hypermetabolism within a borderline prominent portacaval lymph node, nonspecific. While a reactive node is favored, a lymph node metastasis cannot be entirely excluded. Suggest attention to this lymph node on follow-up PET-CT in 3-6 months. 3. Nonspecific new hypermetabolism at the ileocecal valve, more likely physiologic given absence of CT correlate. 4. Scattered subcentimeter pulmonary nodules are all stable and below PET resolution, more likely benign, continued CT surveillance advised. 5. Chronic findings include: Aortic Atherosclerosis (ICD10-I70.0). Marked diffuse colonic diverticulosis. Coronary atherosclerosis.      12/28/2018 Echocardiogram   1. The left ventricle has normal systolic function with an ejection fraction of 60-65%. The cavity size was normal. Left ventricular diastolic Doppler parameters are consistent with impaired relaxation.  2. GLS recorded as -10.7 but LV appears hyperdynamic and tracking of endocardium appears poor.  3. The right ventricle has normal systolic function. The cavity was normal. There is no increase in right ventricular wall thickness.  4. Mild thickening of the mitral valve leaflet.  5. The aortic valve was not well visualized. Mild thickening of the aortic valve. Aortic valve regurgitation is trivial by color flow Doppler.   03/23/2019 Imaging   PET 1. No findings of hypermetabolic residual/recurrent or metastatic disease. 2. Similar low-level hypermetabolism within normal sized portocaval and right inguinal nodes, favored to be reactive. 3. Ongoing stability of small bilateral pulmonary nodules, favored to be benign. Below PET resolution. 4. Coronary artery atherosclerosis. Aortic Atherosclerosis   03/26/2019 Echocardiogram   1. The left ventricle has normal systolic function, with an ejection fraction of 55-60%. The cavity size was normal.  Left ventricular diastolic Doppler parameters are consistent with impaired relaxation.  2. The right ventricle has normal systolic function. The cavity was normal.  3. The mitral valve is abnormal. Mild thickening of the mitral valve leaflet. There is mild mitral annular calcification present.  4. The tricuspid valve is grossly normal.  5. The aortic valve is tricuspid. Mild calcification of the aortic valve. No stenosis of the aortic valve.  6. The aorta is normal unless otherwise noted.  7. Normal LV systolic function; grade 1 diastolic dysfunction; OEU-23.5%.   06/30/2019 Imaging   Chest Impression:   1. No evidence thoracic metastasis. 2. Stable small benign-appearing pulmonary nodules   Abdomen / Pelvis Impression:   1. No evidence of metastatic disease in the abdomen pelvis. 2.  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 3.  Aortic Atherosclerosis (ICD10-I70.0).   06/30/2019 Echocardiogram    1. Left ventricular ejection fraction, by visual estimation, is 60 to 65%. The left ventricle has normal function. There is no left ventricular hypertrophy.  2. Left ventricular diastolic parameters are consistent with Grade I diastolic dysfunction (impaired relaxation).  3. The left ventricle has no regional wall motion abnormalities.  4. Global right ventricle has normal systolic function.The right ventricular size is normal. No increase in right ventricular wall thickness.  5. Left atrial size was mild-moderately dilated.  6. Right atrial size was normal.  7. The mitral valve is normal in structure. Trivial mitral valve regurgitation.  8. The tricuspid valve is normal in structure. Tricuspid valve regurgitation is trivial.  9. The aortic valve is normal in structure. Aortic valve regurgitation  is trivial. No evidence of aortic valve sclerosis or stenosis. 10. The pulmonic valve was grossly normal. Pulmonic valve regurgitation is not visualized. 11. Mildly elevated pulmonary  artery systolic pressure. 12. The inferior vena cava is normal in size with greater than 50% respiratory variability, suggesting right atrial pressure of 3 mmHg. 13. The average left ventricular global longitudinal strain is -12.5 %. GLS underestimated due to poor endocardial tracking.     09/27/2019 Imaging   1. Multiple small, nonspecific pulmonary nodules are again noted. The previously noted lung nodules are unchanged in size from previous exam. There is a new lung nodule within the medial right lower lobe which is nonspecific measure 4 mm. Attention at follow-up imaging is recommended. 2. No evidence of metastatic disease within the abdomen or pelvis. 3. Coronary artery calcifications.  The 4.  Aortic Atherosclerosis (ICD10-I70.0).   09/30/2019 Echocardiogram    1. Left ventricular ejection fraction, by estimation, is 60 to 65%. The left ventricle has normal function. The left ventricle has no regional wall motion abnormalities. Left ventricular diastolic parameters are consistent with Grade I diastolic dysfunction (impaired relaxation).  2. Right ventricular systolic function is normal. The right ventricular size is normal. There is normal pulmonary artery systolic pressure. The estimated right ventricular systolic pressure is 44.0 mmHg.  3. The mitral valve is normal in structure. No evidence of mitral valve regurgitation. No evidence of mitral stenosis.  4. The aortic valve is normal in structure. Aortic valve regurgitation is trivial. No aortic stenosis is present.  5. The inferior vena cava is normal in size with greater than 50% respiratory variability, suggesting right atrial pressure of 3 mmHg.     01/07/2020 Echocardiogram    1. Normal LV function; grade 1 diastolic dysfunction; mild AI; GLS -21%.  2. Left ventricular ejection fraction, by estimation, is 55 to 60%. The left ventricle has normal function. The left ventricle has no regional wall motion abnormalities. Left ventricular  diastolic parameters are consistent with Grade I diastolic dysfunction (impaired relaxation).  3. Right ventricular systolic function is normal. The right ventricular size is normal.  4. The mitral valve is normal in structure. Trivial mitral valve regurgitation. No evidence of mitral stenosis.  5. The aortic valve is tricuspid. Aortic valve regurgitation is mild. Mild aortic valve sclerosis is present, with no evidence of aortic valve stenosis.  6. The inferior vena cava is normal in size with greater than 50% respiratory variability, suggesting right atrial pressure of 3 mmHg.     03/27/2020 Imaging   1. Status post hysterectomy. No evidence of recurrent or metastatic disease in the abdomen or pelvis. 2. Multiple small pulmonary nodules in the lung bases are unchanged to the extent that they are included on current examination and remain most likely sequelae of prior infection or inflammation. Attention on follow-up. 3. Aortic Atherosclerosis (ICD10-I70.0).   04/17/2020 Echocardiogram   1. Normal LVEF, normal and unchanged GLS: -20.2%.  2. Left ventricular ejection fraction, by estimation, is 60 to 65%. The left ventricle has normal function. The left ventricle has no regional wall motion abnormalities. There is mild concentric left ventricular hypertrophy. Left ventricular diastolic parameters are consistent with Grade I diastolic dysfunction (impaired relaxation). The average left ventricular global longitudinal strain is -20.2 %. The global longitudinal strain is normal.  3. Right ventricular systolic function is normal. The right ventricular size is normal.  4. The mitral valve is normal in structure. Mild mitral valve regurgitation. No evidence of mitral stenosis.  5. The  aortic valve is normal in structure. There is mild calcification of the aortic valve. There is mild thickening of the aortic valve. Aortic valve regurgitation is mild. No aortic stenosis is present.  6. The inferior vena cava  is normal in size with greater than 50% respiratory variability, suggesting right atrial pressure of 3 mmHg.   08/02/2020 Echocardiogram   1. Left ventricular ejection fraction, by estimation, is 55 to 60%. The left ventricle has normal function. The left ventricle has no regional wall motion abnormalities. Left ventricular diastolic parameters are consistent with Grade I diastolic dysfunction (impaired relaxation). The average left ventricular global longitudinal strain is -27.0 %. The global longitudinal strain is normal.  2. Right ventricular systolic function is normal. The right ventricular size is normal. Tricuspid regurgitation signal is inadequate for assessing PA pressure.  3. The mitral valve is grossly normal. Trivial mitral valve regurgitation. No evidence of mitral stenosis.  4. The aortic valve is tricuspid. Aortic valve regurgitation is mild. No aortic stenosis is present.  5. The inferior vena cava is normal in size with greater than 50% respiratory variability, suggesting right atrial pressure of 3 mmHg.   09/19/2020 Imaging   1. Status post hysterectomy. No evidence of recurrent or new metastatic disease within the chest, abdomen, or pelvis. 2. Scattered bilateral tiny pulmonary nodules, unchanged from prior studies and most likely sequela of prior infection or inflammation, recommend attention on follow-up imaging. No new suspicious pulmonary nodules or masses. 3. Subcutaneous edema and small hematoma overlying the posterior gluteal musculature, recommend correlation with recent history of trauma. 4. Mild low-density wall thickening of the gastric antrum, which may represent gastritis. 5. Hepatic steatosis. 6. Extensive sigmoid colonic diverticulosis without findings of acute diverticulitis. 7. Aortic atherosclerosis.  Aortic Atherosclerosis (ICD10-I70.0).   11/20/2020 Echocardiogram    1. Compared to echo from Jan 2022, Global longitudinal strain is less negative. (Previously -27%).  Left ventricular ejection fraction, by estimation, is 60 to 65%. The left ventricle has normal function. The left ventricle has no regional wall motion abnormalities. There is mild left ventricular hypertrophy. Left ventricular diastolic parameters are indeterminate.  2. Right ventricular systolic function is normal. The right ventricular size is normal.  3. The mitral valve is normal in structure. Trivial mitral valve regurgitation.  4. Aortic valve regurgitation is mild. Mild aortic valve sclerosis is present, with no evidence of aortic valve stenosis.     Metastasis to lymph nodes ([place])  10/14/2018 Initial Diagnosis   Metastasis to lymph nodes ([place])    10/21/2018 - 02/24/2019 Chemotherapy   The patient had palonosetron (ALOXI) injection 0.25 mg, 0.25 mg, Intravenous,  Once, 7 of 7 cycles Administration: 0.25 mg (10/21/2018), 0.25 mg (11/11/2018), 0.25 mg (12/02/2018), 0.25 mg (12/23/2018), 0.25 mg (01/13/2019), 0.25 mg (02/03/2019), 0.25 mg (02/24/2019) CARBOplatin (PARAPLATIN) 310 mg in sodium chloride 0.9 % 250 mL chemo infusion, 310 mg, Intravenous,  Once, 7 of 7 cycles Dose modification: 300 mg (original dose 311.5 mg, Cycle 4, Reason: Dose not tolerated) Administration: 310 mg (10/21/2018), 300 mg (11/11/2018), 300 mg (12/02/2018), 300 mg (12/23/2018), 300 mg (01/13/2019), 300 mg (02/03/2019), 300 mg (02/24/2019) PACLitaxel (TAXOL) 222 mg in sodium chloride 0.9 % 250 mL chemo infusion (> 31m/m2), 135 mg/m2 = 222 mg, Intravenous,  Once, 7 of 7 cycles Administration: 222 mg (10/21/2018), 222 mg (11/11/2018), 222 mg (12/02/2018), 222 mg (12/23/2018), 222 mg (01/13/2019), 222 mg (02/03/2019), 222 mg (02/24/2019) fosaprepitant (EMEND) 150 mg, dexamethasone (DECADRON) 12 mg in sodium chloride 0.9 % 145 mL  IVPB, , Intravenous,  Once, 7 of 7 cycles Administration:  (10/21/2018),  (11/11/2018),  (12/02/2018),  (12/23/2018),  (01/13/2019),  (02/03/2019),  (02/24/2019)   for chemotherapy treatment.       REVIEW OF SYSTEMS:    Constitutional: Denies fevers, chills or abnormal weight loss Eyes: Denies blurriness of vision Ears, nose, mouth, throat, and face: Denies mucositis or sore throat Respiratory: Denies cough, dyspnea or wheezes Cardiovascular: Denies palpitation, chest discomfort or lower extremity swelling Gastrointestinal:  Denies nausea, heartburn or change in bowel habits Skin: Denies abnormal skin rashes Lymphatics: Denies new lymphadenopathy or easy bruising Neurological:Denies numbness, tingling or new weaknesses Behavioral/Psych: Mood is stable, no new changes  All other systems were reviewed with the patient and are negative.  I have reviewed the past medical history, past surgical history, social history and family history with the patient and they are unchanged from previous note.  ALLERGIES:  has No Known Allergies.  MEDICATIONS:  Current Outpatient Medications  Medication Sig Dispense Refill   acetaminophen (TYLENOL) 500 MG tablet Take 1,000 mg by mouth every 6 (six) hours as needed.     aspirin EC 81 MG tablet Take 81 mg by mouth daily.     atorvastatin (LIPITOR) 20 MG tablet Take 20 mg by mouth every evening.      Calcium Carbonate-Vitamin D (CALCIUM-VITAMIN D) 500-200 MG-UNIT per tablet Take 1 tablet by mouth daily.     citalopram (CELEXA) 20 MG tablet Take 20 mg by mouth at bedtime.      estradiol (ESTRACE VAGINAL) 0.1 MG/GM vaginal cream Place 1 Applicatorful vaginally 3 (three) times a week. 42.5 g 12   gabapentin (NEURONTIN) 600 MG tablet Take 600 mg by mouth at bedtime as needed.     levothyroxine (SYNTHROID, LEVOTHROID) 100 MCG tablet Take 100 mcg by mouth daily before breakfast.      lidocaine-prilocaine (EMLA) cream Apply to affected area once 30 g 3   losartan-hydrochlorothiazide (HYZAAR) 100-25 MG tablet      magnesium oxide (MAG-OX) 400 (241.3 Mg) MG tablet Take 1 tablet (400 mg total) by mouth daily. 30 tablet 11   metoprolol succinate (TOPROL-XL) 25 MG 24 hr tablet Take 25  mg by mouth daily.     Multiple Vitamin (MULTIVITAMIN WITH MINERALS) TABS Take 1 tablet by mouth daily.     ondansetron (ZOFRAN) 8 MG tablet Take 1 tablet (8 mg total) by mouth every 8 (eight) hours as needed. Start on the third day after chemotherapy. 30 tablet 1   pantoprazole (PROTONIX) 40 MG tablet Take 40 mg by mouth every morning.  (Patient not taking: Reported on 02/15/2020)     valACYclovir (VALTREX) 1000 MG tablet valacyclovir 1 gram tablet     No current facility-administered medications for this visit.   Facility-Administered Medications Ordered in Other Visits  Medication Dose Route Frequency Provider Last Rate Last Admin   heparin lock flush 100 unit/mL  500 Units Intracatheter Once PRN Alvy Bimler, Rielly Corlett, MD       sodium chloride flush (NS) 0.9 % injection 10 mL  10 mL Intracatheter PRN Alvy Bimler, Nelia Rogoff, MD       trastuzumab-anns (KANJINTI) 378 mg in sodium chloride 0.9 % 250 mL chemo infusion  6 mg/kg (Treatment Plan Recorded) Intravenous Once [name], MD        PHYSICAL EXAMINATION: ECOG PERFORMANCE STATUS: 0 - Asymptomatic  Vitals:   01/23/21 1213  BP: (!) 117/57  Pulse: 72  Resp: 18  Temp: (!) 97.4 F (36.3 C)  SpO2:  96%   Filed Weights   01/23/21 1213  Weight: 135 lb 3.2 oz (61.3 kg)    GENERAL:alert, no distress and comfortable SKIN: skin color, texture, turgor are normal, no rashes or significant lesions EYES: normal, Conjunctiva are pink and non-injected, sclera clear OROPHARYNX:no exudate, no erythema and lips, buccal mucosa, and tongue normal  NECK: supple, thyroid normal size, non-tender, without nodularity LYMPH:  no palpable lymphadenopathy in the cervical, axillary or inguinal LUNGS: clear to auscultation and percussion with normal breathing effort HEART: regular rate & rhythm and no murmurs and no lower extremity edema ABDOMEN:abdomen soft, non-tender and normal bowel sounds Musculoskeletal:no cyanosis of digits and no clubbing  NEURO: alert & oriented x 3  with fluent speech, no focal motor/sensory deficits  LABORATORY DATA:  I have reviewed the data as listed    Component Value Date/Time   NA 138 01/23/2021 1152   NA 139 02/13/2017 1512   K 3.9 01/23/2021 1152   K 4.2 02/13/2017 1512   CL 99 01/23/2021 1152   CO2 27 01/23/2021 1152   CO2 28 02/13/2017 1512   GLUCOSE 123 (H) 01/23/2021 1152   GLUCOSE 118 02/13/2017 1512   BUN 21 01/23/2021 1152   BUN 25.2 02/13/2017 1512   CREATININE 1.37 (H) 01/23/2021 1152   CREATININE 1.37 (H) 12/21/2019 0850   CREATININE 1.2 (H) 02/13/2017 1512   CALCIUM 9.6 01/23/2021 1152   CALCIUM 10.0 02/13/2017 1512   PROT 7.0 01/23/2021 1152   ALBUMIN 3.8 01/23/2021 1152   AST 22 01/23/2021 1152   AST 19 12/21/2019 0850   ALT 18 01/23/2021 1152   ALT 13 12/21/2019 0850   ALKPHOS 67 01/23/2021 1152   BILITOT 0.4 01/23/2021 1152   BILITOT 0.4 12/21/2019 0850   GFRNONAA 39 (L) 01/23/2021 1152   GFRNONAA 37 (L) 12/21/2019 0850   GFRAA 47 (L) 03/28/2020 0933   GFRAA 42 (L) 12/21/2019 0850    No results found for: SPEP, UPEP  Lab Results  Component Value Date   WBC 7.4 01/23/2021   NEUTROABS 5.1 01/23/2021   HGB 10.7 (L) 01/23/2021   HCT 29.4 (L) 01/23/2021   MCV 87.2 01/23/2021   PLT 228 01/23/2021      Chemistry      Component Value Date/Time   NA 138 01/23/2021 1152   NA 139 02/13/2017 1512   K 3.9 01/23/2021 1152   K 4.2 02/13/2017 1512   CL 99 01/23/2021 1152   CO2 27 01/23/2021 1152   CO2 28 02/13/2017 1512   BUN 21 01/23/2021 1152   BUN 25.2 02/13/2017 1512   CREATININE 1.37 (H) 01/23/2021 1152   CREATININE 1.37 (H) 12/21/2019 0850   CREATININE 1.2 (H) 02/13/2017 1512      Component Value Date/Time   CALCIUM 9.6 01/23/2021 1152   CALCIUM 10.0 02/13/2017 1512   ALKPHOS 67 01/23/2021 1152   AST 22 01/23/2021 1152   AST 19 12/21/2019 0850   ALT 18 01/23/2021 1152   ALT 13 12/21/2019 0850   BILITOT 0.4 01/23/2021 1152   BILITOT 0.4 12/21/2019 0850

## 2021-01-25 ENCOUNTER — Telehealth: Payer: Self-pay

## 2021-01-25 NOTE — Telephone Encounter (Signed)
-----   Message from Jolaine Click, RN sent at 01/23/2021  3:38 PM EDT -----  ----- Message ----- From: [name], MD Sent: 01/23/2021  12:45 PM EDT To: Jolaine Click, RN  Non urgent, please help schedule ECHO end of AUgust or early sept Thanks

## 2021-01-25 NOTE — Telephone Encounter (Signed)
Patient scheduled for echocardiogram for 8/29 @ 10am.    Left message for patient to call back to confirm appointment information.

## 2021-02-11 DIAGNOSIS — I10 Essential (primary) hypertension: Secondary | ICD-10-CM | POA: Diagnosis not present

## 2021-02-11 DIAGNOSIS — E039 Hypothyroidism, unspecified: Secondary | ICD-10-CM | POA: Diagnosis not present

## 2021-02-11 DIAGNOSIS — K219 Gastro-esophageal reflux disease without esophagitis: Secondary | ICD-10-CM | POA: Diagnosis not present

## 2021-02-11 DIAGNOSIS — E785 Hyperlipidemia, unspecified: Secondary | ICD-10-CM | POA: Diagnosis not present

## 2021-02-13 ENCOUNTER — Other Ambulatory Visit: Payer: Self-pay

## 2021-02-13 ENCOUNTER — Inpatient Hospital Stay: Payer: Medicare HMO

## 2021-02-13 ENCOUNTER — Inpatient Hospital Stay: Payer: Medicare HMO | Attending: Gynecologic Oncology

## 2021-02-13 VITALS — BP 160/67 | HR 68 | Temp 97.9°F | Resp 17 | Wt 133.5 lb

## 2021-02-13 DIAGNOSIS — Z79899 Other long term (current) drug therapy: Secondary | ICD-10-CM | POA: Insufficient documentation

## 2021-02-13 DIAGNOSIS — D631 Anemia in chronic kidney disease: Secondary | ICD-10-CM | POA: Insufficient documentation

## 2021-02-13 DIAGNOSIS — C541 Malignant neoplasm of endometrium: Secondary | ICD-10-CM

## 2021-02-13 DIAGNOSIS — Z90710 Acquired absence of both cervix and uterus: Secondary | ICD-10-CM | POA: Insufficient documentation

## 2021-02-13 DIAGNOSIS — Z7982 Long term (current) use of aspirin: Secondary | ICD-10-CM | POA: Diagnosis not present

## 2021-02-13 DIAGNOSIS — N189 Chronic kidney disease, unspecified: Secondary | ICD-10-CM | POA: Diagnosis not present

## 2021-02-13 DIAGNOSIS — Z5112 Encounter for antineoplastic immunotherapy: Secondary | ICD-10-CM | POA: Diagnosis not present

## 2021-02-13 DIAGNOSIS — C779 Secondary and unspecified malignant neoplasm of lymph node, unspecified: Secondary | ICD-10-CM | POA: Insufficient documentation

## 2021-02-13 LAB — COMPREHENSIVE METABOLIC PANEL
ALT: 17 U/L (ref 0–44)
AST: 20 U/L (ref 15–41)
Albumin: 3.9 g/dL (ref 3.5–5.0)
Alkaline Phosphatase: 61 U/L (ref 38–126)
Anion gap: 8 (ref 5–15)
BUN: 23 mg/dL (ref 8–23)
CO2: 28 mmol/L (ref 22–32)
Calcium: 10 mg/dL (ref 8.9–10.3)
Chloride: 102 mmol/L (ref 98–111)
Creatinine, Ser: 1.36 mg/dL — ABNORMAL HIGH (ref 0.44–1.00)
GFR, Estimated: 39 mL/min — ABNORMAL LOW (ref >60)
Glucose, Bld: 96 mg/dL (ref 70–99)
Potassium: 4 mmol/L (ref 3.5–5.1)
Sodium: 138 mmol/L (ref 135–145)
Total Bilirubin: 0.4 mg/dL (ref 0.3–1.2)
Total Protein: 6.8 g/dL (ref 6.5–8.1)

## 2021-02-13 LAB — CBC WITH DIFFERENTIAL/PLATELET
Abs Immature Granulocytes: 0.03 10*3/uL (ref 0.00–0.07)
Basophils Absolute: 0.1 10*3/uL (ref 0.0–0.1)
Basophils Relative: 1 %
Eosinophils Absolute: 0.2 10*3/uL (ref 0.0–0.5)
Eosinophils Relative: 4 %
HCT: 28 % — ABNORMAL LOW (ref 36.0–46.0)
Hemoglobin: 10.1 g/dL — ABNORMAL LOW (ref 12.0–15.0)
Immature Granulocytes: 1 %
Lymphocytes Relative: 22 %
Lymphs Abs: 1.3 10*3/uL (ref 0.7–4.0)
MCH: 31.5 pg (ref 26.0–34.0)
MCHC: 36.1 g/dL — ABNORMAL HIGH (ref 30.0–36.0)
MCV: 87.2 fL (ref 80.0–100.0)
Monocytes Absolute: 0.5 10*3/uL (ref 0.1–1.0)
Monocytes Relative: 9 %
Neutro Abs: 3.9 10*3/uL (ref 1.7–7.7)
Neutrophils Relative %: 63 %
Platelets: 220 10*3/uL (ref 150–400)
RBC: 3.21 MIL/uL — ABNORMAL LOW (ref 3.87–5.11)
RDW: 11.9 % (ref 11.5–15.5)
WBC: 6.1 10*3/uL (ref 4.0–10.5)
nRBC: 0 % (ref 0.0–0.2)

## 2021-02-13 MED ORDER — HEPARIN SOD (PORK) LOCK FLUSH 100 UNIT/ML IV SOLN
500.0000 [IU] | Freq: Once | INTRAVENOUS | Status: AC | PRN
  Administered 2021-02-13: 500 [IU]
  Filled 2021-02-13: qty 5

## 2021-02-13 MED ORDER — SODIUM CHLORIDE 0.9 % IV SOLN
Freq: Once | INTRAVENOUS | Status: AC
  Filled 2021-02-13: qty 250

## 2021-02-13 MED ORDER — ACETAMINOPHEN 325 MG PO TABS
ORAL_TABLET | ORAL | Status: AC
  Filled 2021-02-13: qty 2

## 2021-02-13 MED ORDER — TRASTUZUMAB-ANNS CHEMO 150 MG IV SOLR
6.0000 mg/kg | Freq: Once | INTRAVENOUS | Status: AC
  Administered 2021-02-13: 378 mg via INTRAVENOUS
  Filled 2021-02-13: qty 18

## 2021-02-13 MED ORDER — DIPHENHYDRAMINE HCL 25 MG PO TABS
25.0000 mg | ORAL_TABLET | Freq: Once | ORAL | Status: AC
  Administered 2021-02-13: 25 mg via ORAL
  Filled 2021-02-13: qty 1

## 2021-02-13 MED ORDER — SODIUM CHLORIDE 0.9% FLUSH
10.0000 mL | INTRAVENOUS | Status: DC | PRN
  Administered 2021-02-13: 10 mL
  Filled 2021-02-13: qty 10

## 2021-02-13 MED ORDER — DIPHENHYDRAMINE HCL 25 MG PO CAPS
ORAL_CAPSULE | ORAL | Status: AC
  Filled 2021-02-13: qty 1

## 2021-02-13 MED ORDER — SODIUM CHLORIDE 0.9% FLUSH
10.0000 mL | Freq: Once | INTRAVENOUS | Status: AC
  Administered 2021-02-13: 10 mL
  Filled 2021-02-13: qty 10

## 2021-02-13 MED ORDER — ACETAMINOPHEN 325 MG PO TABS
650.0000 mg | ORAL_TABLET | Freq: Once | ORAL | Status: AC
  Administered 2021-02-13: 650 mg via ORAL

## 2021-02-14 DIAGNOSIS — E785 Hyperlipidemia, unspecified: Secondary | ICD-10-CM | POA: Diagnosis not present

## 2021-02-14 DIAGNOSIS — B349 Viral infection, unspecified: Secondary | ICD-10-CM | POA: Diagnosis not present

## 2021-02-14 DIAGNOSIS — E039 Hypothyroidism, unspecified: Secondary | ICD-10-CM | POA: Diagnosis not present

## 2021-02-14 DIAGNOSIS — C50919 Malignant neoplasm of unspecified site of unspecified female breast: Secondary | ICD-10-CM | POA: Diagnosis not present

## 2021-02-14 DIAGNOSIS — I10 Essential (primary) hypertension: Secondary | ICD-10-CM | POA: Diagnosis not present

## 2021-02-14 DIAGNOSIS — K219 Gastro-esophageal reflux disease without esophagitis: Secondary | ICD-10-CM | POA: Diagnosis not present

## 2021-02-14 DIAGNOSIS — N1832 Chronic kidney disease, stage 3b: Secondary | ICD-10-CM | POA: Diagnosis not present

## 2021-03-12 ENCOUNTER — Ambulatory Visit (HOSPITAL_COMMUNITY)
Admission: RE | Admit: 2021-03-12 | Discharge: 2021-03-12 | Disposition: A | Discharge status: Home / Self Care | Payer: Medicare HMO | Source: Ambulatory Visit | Attending: Hematology and Oncology | Admitting: Hematology and Oncology

## 2021-03-12 ENCOUNTER — Other Ambulatory Visit: Payer: Self-pay

## 2021-03-12 DIAGNOSIS — C541 Malignant neoplasm of endometrium: Secondary | ICD-10-CM

## 2021-03-12 DIAGNOSIS — I10 Essential (primary) hypertension: Secondary | ICD-10-CM

## 2021-03-12 DIAGNOSIS — C50919 Malignant neoplasm of unspecified site of unspecified female breast: Secondary | ICD-10-CM | POA: Diagnosis not present

## 2021-03-12 DIAGNOSIS — Z0189 Encounter for other specified special examinations: Secondary | ICD-10-CM

## 2021-03-12 DIAGNOSIS — Z01810 Encounter for preprocedural cardiovascular examination: Secondary | ICD-10-CM | POA: Diagnosis not present

## 2021-03-12 DIAGNOSIS — E785 Hyperlipidemia, unspecified: Secondary | ICD-10-CM | POA: Diagnosis not present

## 2021-03-12 DIAGNOSIS — R918 Other nonspecific abnormal finding of lung field: Secondary | ICD-10-CM | POA: Diagnosis not present

## 2021-03-12 LAB — ECHOCARDIOGRAM COMPLETE
Area-P 1/2: 3.37 cm2
Calc EF: 63.6 %
P 1/2 time: 403 msec
S' Lateral: 2.3 cm
Single Plane A2C EF: 63.6 %
Single Plane A4C EF: 64.4 %

## 2021-03-12 NOTE — Progress Notes (Signed)
  Echocardiogram 2D Echocardiogram has been performed.  Vicki Cervantes 03/12/2021, 11:23 AM

## 2021-03-13 ENCOUNTER — Inpatient Hospital Stay: Payer: Medicare HMO

## 2021-03-13 VITALS — BP 159/59 | HR 65 | Temp 98.6°F | Resp 16 | Wt 133.2 lb

## 2021-03-13 DIAGNOSIS — C779 Secondary and unspecified malignant neoplasm of lymph node, unspecified: Secondary | ICD-10-CM | POA: Diagnosis not present

## 2021-03-13 DIAGNOSIS — C541 Malignant neoplasm of endometrium: Secondary | ICD-10-CM | POA: Diagnosis not present

## 2021-03-13 DIAGNOSIS — Z90710 Acquired absence of both cervix and uterus: Secondary | ICD-10-CM | POA: Diagnosis not present

## 2021-03-13 DIAGNOSIS — D631 Anemia in chronic kidney disease: Secondary | ICD-10-CM | POA: Diagnosis not present

## 2021-03-13 DIAGNOSIS — N189 Chronic kidney disease, unspecified: Secondary | ICD-10-CM | POA: Diagnosis not present

## 2021-03-13 DIAGNOSIS — Z7982 Long term (current) use of aspirin: Secondary | ICD-10-CM | POA: Diagnosis not present

## 2021-03-13 DIAGNOSIS — Z79899 Other long term (current) drug therapy: Secondary | ICD-10-CM | POA: Diagnosis not present

## 2021-03-13 DIAGNOSIS — Z5112 Encounter for antineoplastic immunotherapy: Secondary | ICD-10-CM | POA: Diagnosis not present

## 2021-03-13 LAB — CBC WITH DIFFERENTIAL/PLATELET
Abs Immature Granulocytes: 0.03 10*3/uL (ref 0.00–0.07)
Basophils Absolute: 0.1 10*3/uL (ref 0.0–0.1)
Basophils Relative: 2 %
Eosinophils Absolute: 0.3 10*3/uL (ref 0.0–0.5)
Eosinophils Relative: 5 %
HCT: 30.1 % — ABNORMAL LOW (ref 36.0–46.0)
Hemoglobin: 10.7 g/dL — ABNORMAL LOW (ref 12.0–15.0)
Immature Granulocytes: 1 %
Lymphocytes Relative: 27 %
Lymphs Abs: 1.6 10*3/uL (ref 0.7–4.0)
MCH: 31.8 pg (ref 26.0–34.0)
MCHC: 35.5 g/dL (ref 30.0–36.0)
MCV: 89.6 fL (ref 80.0–100.0)
Monocytes Absolute: 0.6 10*3/uL (ref 0.1–1.0)
Monocytes Relative: 9 %
Neutro Abs: 3.4 10*3/uL (ref 1.7–7.7)
Neutrophils Relative %: 56 %
Platelets: 211 10*3/uL (ref 150–400)
RBC: 3.36 MIL/uL — ABNORMAL LOW (ref 3.87–5.11)
RDW: 12 % (ref 11.5–15.5)
WBC: 6.1 10*3/uL (ref 4.0–10.5)
nRBC: 0 % (ref 0.0–0.2)

## 2021-03-13 LAB — COMPREHENSIVE METABOLIC PANEL
ALT: 15 U/L (ref 0–44)
AST: 19 U/L (ref 15–41)
Albumin: 3.9 g/dL (ref 3.5–5.0)
Alkaline Phosphatase: 67 U/L (ref 38–126)
Anion gap: 10 (ref 5–15)
BUN: 25 mg/dL — ABNORMAL HIGH (ref 8–23)
CO2: 26 mmol/L (ref 22–32)
Calcium: 9.6 mg/dL (ref 8.9–10.3)
Chloride: 104 mmol/L (ref 98–111)
Creatinine, Ser: 1.33 mg/dL — ABNORMAL HIGH (ref 0.44–1.00)
GFR, Estimated: 40 mL/min — ABNORMAL LOW (ref >60)
Glucose, Bld: 95 mg/dL (ref 70–99)
Potassium: 4.1 mmol/L (ref 3.5–5.1)
Sodium: 140 mmol/L (ref 135–145)
Total Bilirubin: 0.4 mg/dL (ref 0.3–1.2)
Total Protein: 6.9 g/dL (ref 6.5–8.1)

## 2021-03-13 MED ORDER — SODIUM CHLORIDE 0.9 % IV SOLN: Freq: Once | INTRAVENOUS | Status: AC

## 2021-03-13 MED ORDER — TRASTUZUMAB-ANNS CHEMO 150 MG IV SOLR
6.0000 mg/kg | Freq: Once | INTRAVENOUS | Status: AC
  Administered 2021-03-13: 378 mg via INTRAVENOUS
  Filled 2021-03-13: qty 18

## 2021-03-13 MED ORDER — SODIUM CHLORIDE 0.9% FLUSH
10.0000 mL | Freq: Once | INTRAVENOUS | Status: AC
  Administered 2021-03-13: 10 mL

## 2021-03-13 MED ORDER — SODIUM CHLORIDE 0.9% FLUSH
10.0000 mL | INTRAVENOUS | Status: DC | PRN
  Administered 2021-03-13: 10 mL

## 2021-03-13 MED ORDER — HEPARIN SOD (PORK) LOCK FLUSH 100 UNIT/ML IV SOLN
500.0000 [IU] | Freq: Once | INTRAVENOUS | Status: AC | PRN
  Administered 2021-03-13: 500 [IU]

## 2021-03-13 MED ORDER — DIPHENHYDRAMINE HCL 25 MG PO TABS
25.0000 mg | ORAL_TABLET | Freq: Once | ORAL | Status: AC
  Administered 2021-03-13: 25 mg via ORAL
  Filled 2021-03-13: qty 1

## 2021-03-13 MED ORDER — ACETAMINOPHEN 325 MG PO TABS
650.0000 mg | ORAL_TABLET | Freq: Once | ORAL | Status: AC
  Administered 2021-03-13: 650 mg via ORAL
  Filled 2021-03-13: qty 2

## 2021-03-13 MED ORDER — DIPHENHYDRAMINE HCL 25 MG PO CAPS
ORAL_CAPSULE | ORAL | Status: AC
  Filled 2021-03-13: qty 1

## 2021-03-13 NOTE — Patient Instructions (Signed)
[facility]  Discharge Instructions: Thank you for choosing [facility] to provide your oncology and hematology care.   If you have a lab appointment with the Cancer Center, please go directly to the Cancer Center and check in at the registration area.   Wear comfortable clothing and clothing appropriate for easy access to any Portacath or PICC line.   We strive to give you quality time with your provider. You may need to reschedule your appointment if you arrive late (15 or more minutes).  Arriving late affects you and other patients whose appointments are after yours.  Also, if you miss three or more appointments without notifying the office, you may be dismissed from the clinic at the provider's discretion.      For prescription refill requests, have your pharmacy contact our office and allow 72 hours for refills to be completed.    Today you received the following chemotherapy and/or immunotherapy agents trastuzumab      To help prevent nausea and vomiting after your treatment, we encourage you to take your nausea medication as directed.  BELOW ARE SYMPTOMS THAT SHOULD BE REPORTED IMMEDIATELY: *FEVER GREATER THAN 100.4 F (38 C) OR HIGHER *CHILLS OR SWEATING *NAUSEA AND VOMITING THAT IS NOT CONTROLLED WITH YOUR NAUSEA MEDICATION *UNUSUAL SHORTNESS OF BREATH *UNUSUAL BRUISING OR BLEEDING *URINARY PROBLEMS (pain or burning when urinating, or frequent urination) *BOWEL PROBLEMS (unusual diarrhea, constipation, pain near the anus) TENDERNESS IN MOUTH AND THROAT WITH OR WITHOUT PRESENCE OF ULCERS (sore throat, sores in mouth, or a toothache) UNUSUAL RASH, SWELLING OR PAIN  UNUSUAL VAGINAL DISCHARGE OR ITCHING   Items with * indicate a potential emergency and should be followed up as soon as possible or go to the Emergency Department if any problems should occur.  Please show the CHEMOTHERAPY ALERT CARD or IMMUNOTHERAPY ALERT CARD at check-in to  the Emergency Department and triage nurse.  Should you have questions after your visit or need to cancel or reschedule your appointment, please contact [facility]  Dept: [phone]  and follow the prompts.  Office hours are 8:00 a.m. to 4:30 p.m. Monday - Friday. Please note that voicemails left after 4:00 p.m. may not be returned until the following business day.  We are closed weekends and major holidays. You have access to a nurse at all times for urgent questions. Please call the main number to the clinic Dept: [phone] and follow the prompts.   For any non-urgent questions, you may also contact your provider using MyChart. We now offer e-Visits for anyone 18 and older to request care online for non-urgent symptoms. For details visit [email or web address].   Also download the MyChart app! Go to the app store, search "MyChart", open the app, select [facility], and log in with your MyChart username and password.  Due to Covid, a mask is required upon entering the hospital/clinic. If you do not have a mask, one will be given to you upon arrival. For doctor visits, patients may have 1 support person aged 18 or older with them. For treatment visits, patients cannot have anyone with them due to current Covid guidelines and our immunocompromised population.   

## 2021-03-14 DIAGNOSIS — E039 Hypothyroidism, unspecified: Secondary | ICD-10-CM | POA: Diagnosis not present

## 2021-03-14 DIAGNOSIS — E785 Hyperlipidemia, unspecified: Secondary | ICD-10-CM | POA: Diagnosis not present

## 2021-03-14 DIAGNOSIS — K219 Gastro-esophageal reflux disease without esophagitis: Secondary | ICD-10-CM | POA: Diagnosis not present

## 2021-03-14 DIAGNOSIS — I10 Essential (primary) hypertension: Secondary | ICD-10-CM | POA: Diagnosis not present

## 2021-03-30 ENCOUNTER — Other Ambulatory Visit: Payer: Self-pay

## 2021-03-30 ENCOUNTER — Ambulatory Visit (HOSPITAL_COMMUNITY)
Admission: RE | Admit: 2021-03-30 | Discharge: 2021-03-30 | Disposition: A | Discharge status: Home / Self Care | Payer: Medicare HMO | Source: Ambulatory Visit | Attending: Hematology and Oncology | Admitting: Hematology and Oncology

## 2021-03-30 DIAGNOSIS — R911 Solitary pulmonary nodule: Secondary | ICD-10-CM | POA: Diagnosis not present

## 2021-03-30 DIAGNOSIS — C773 Secondary and unspecified malignant neoplasm of axilla and upper limb lymph nodes: Secondary | ICD-10-CM | POA: Diagnosis not present

## 2021-03-30 DIAGNOSIS — C541 Malignant neoplasm of endometrium: Secondary | ICD-10-CM

## 2021-03-30 DIAGNOSIS — R918 Other nonspecific abnormal finding of lung field: Secondary | ICD-10-CM | POA: Diagnosis not present

## 2021-03-30 DIAGNOSIS — I700 Atherosclerosis of aorta: Secondary | ICD-10-CM | POA: Diagnosis not present

## 2021-04-02 ENCOUNTER — Inpatient Hospital Stay: Payer: Medicare HMO | Attending: Gynecologic Oncology

## 2021-04-02 ENCOUNTER — Inpatient Hospital Stay: Payer: Medicare HMO

## 2021-04-02 ENCOUNTER — Encounter: Payer: Self-pay | Admitting: Hematology and Oncology

## 2021-04-02 ENCOUNTER — Other Ambulatory Visit: Payer: Self-pay

## 2021-04-02 ENCOUNTER — Inpatient Hospital Stay (HOSPITAL_BASED_OUTPATIENT_CLINIC_OR_DEPARTMENT_OTHER): Payer: Medicare HMO | Admitting: Hematology and Oncology

## 2021-04-02 DIAGNOSIS — C779 Secondary and unspecified malignant neoplasm of lymph node, unspecified: Secondary | ICD-10-CM | POA: Insufficient documentation

## 2021-04-02 DIAGNOSIS — N183 Chronic kidney disease, stage 3 unspecified: Secondary | ICD-10-CM | POA: Diagnosis not present

## 2021-04-02 DIAGNOSIS — Z9013 Acquired absence of bilateral breasts and nipples: Secondary | ICD-10-CM | POA: Insufficient documentation

## 2021-04-02 DIAGNOSIS — C541 Malignant neoplasm of endometrium: Secondary | ICD-10-CM | POA: Insufficient documentation

## 2021-04-02 DIAGNOSIS — Z853 Personal history of malignant neoplasm of breast: Secondary | ICD-10-CM | POA: Insufficient documentation

## 2021-04-02 DIAGNOSIS — D631 Anemia in chronic kidney disease: Secondary | ICD-10-CM | POA: Insufficient documentation

## 2021-04-02 DIAGNOSIS — Z90710 Acquired absence of both cervix and uterus: Secondary | ICD-10-CM | POA: Insufficient documentation

## 2021-04-02 DIAGNOSIS — Z7982 Long term (current) use of aspirin: Secondary | ICD-10-CM | POA: Insufficient documentation

## 2021-04-02 DIAGNOSIS — D539 Nutritional anemia, unspecified: Secondary | ICD-10-CM | POA: Diagnosis not present

## 2021-04-02 DIAGNOSIS — Z79899 Other long term (current) drug therapy: Secondary | ICD-10-CM | POA: Diagnosis not present

## 2021-04-02 DIAGNOSIS — Z5112 Encounter for antineoplastic immunotherapy: Secondary | ICD-10-CM | POA: Insufficient documentation

## 2021-04-02 LAB — CBC WITH DIFFERENTIAL/PLATELET
Abs Immature Granulocytes: 0.02 10*3/uL (ref 0.00–0.07)
Basophils Absolute: 0.1 10*3/uL (ref 0.0–0.1)
Basophils Relative: 1 %
Eosinophils Absolute: 0.2 10*3/uL (ref 0.0–0.5)
Eosinophils Relative: 4 %
HCT: 29.8 % — ABNORMAL LOW (ref 36.0–46.0)
Hemoglobin: 10.7 g/dL — ABNORMAL LOW (ref 12.0–15.0)
Immature Granulocytes: 0 %
Lymphocytes Relative: 28 %
Lymphs Abs: 1.5 10*3/uL (ref 0.7–4.0)
MCH: 32 pg (ref 26.0–34.0)
MCHC: 35.9 g/dL (ref 30.0–36.0)
MCV: 89.2 fL (ref 80.0–100.0)
Monocytes Absolute: 0.4 10*3/uL (ref 0.1–1.0)
Monocytes Relative: 8 %
Neutro Abs: 3.3 10*3/uL (ref 1.7–7.7)
Neutrophils Relative %: 59 %
Platelets: 232 10*3/uL (ref 150–400)
RBC: 3.34 MIL/uL — ABNORMAL LOW (ref 3.87–5.11)
RDW: 12 % (ref 11.5–15.5)
WBC: 5.6 10*3/uL (ref 4.0–10.5)
nRBC: 0 % (ref 0.0–0.2)

## 2021-04-02 LAB — COMPREHENSIVE METABOLIC PANEL
ALT: 14 U/L (ref 0–44)
AST: 17 U/L (ref 15–41)
Albumin: 4 g/dL (ref 3.5–5.0)
Alkaline Phosphatase: 67 U/L (ref 38–126)
Anion gap: 11 (ref 5–15)
BUN: 32 mg/dL — ABNORMAL HIGH (ref 8–23)
CO2: 25 mmol/L (ref 22–32)
Calcium: 10.6 mg/dL — ABNORMAL HIGH (ref 8.9–10.3)
Chloride: 105 mmol/L (ref 98–111)
Creatinine, Ser: 1.39 mg/dL — ABNORMAL HIGH (ref 0.44–1.00)
GFR, Estimated: 38 mL/min — ABNORMAL LOW (ref >60)
Glucose, Bld: 103 mg/dL — ABNORMAL HIGH (ref 70–99)
Potassium: 3.7 mmol/L (ref 3.5–5.1)
Sodium: 141 mmol/L (ref 135–145)
Total Bilirubin: 0.4 mg/dL (ref 0.3–1.2)
Total Protein: 7 g/dL (ref 6.5–8.1)

## 2021-04-02 MED ORDER — ACETAMINOPHEN 325 MG PO TABS
650.0000 mg | ORAL_TABLET | Freq: Once | ORAL | Status: AC
  Administered 2021-04-02: 650 mg via ORAL
  Filled 2021-04-02: qty 2

## 2021-04-02 MED ORDER — SODIUM CHLORIDE 0.9 % IV SOLN
Freq: Once | INTRAVENOUS | Status: AC
Start: 2021-04-02 — End: 2021-04-02

## 2021-04-02 MED ORDER — DIPHENHYDRAMINE HCL 25 MG PO CAPS
25.0000 mg | ORAL_CAPSULE | Freq: Once | ORAL | Status: AC
  Administered 2021-04-02: 25 mg via ORAL
  Filled 2021-04-02: qty 1

## 2021-04-02 MED ORDER — HEPARIN SOD (PORK) LOCK FLUSH 100 UNIT/ML IV SOLN
500.0000 [IU] | Freq: Once | INTRAVENOUS | Status: AC | PRN
  Administered 2021-04-02: 500 [IU]

## 2021-04-02 MED ORDER — TRASTUZUMAB-ANNS CHEMO 150 MG IV SOLR
6.0000 mg/kg | Freq: Once | INTRAVENOUS | Status: AC
  Administered 2021-04-02: 378 mg via INTRAVENOUS
  Filled 2021-04-02: qty 18

## 2021-04-02 MED ORDER — DIPHENHYDRAMINE HCL 25 MG PO CAPS
ORAL_CAPSULE | ORAL | Status: AC
  Filled 2021-04-02: qty 1

## 2021-04-02 MED ORDER — SODIUM CHLORIDE 0.9% FLUSH
10.0000 mL | Freq: Once | INTRAVENOUS | Status: AC
  Administered 2021-04-02: 10 mL

## 2021-04-02 MED ORDER — SODIUM CHLORIDE 0.9% FLUSH
10.0000 mL | INTRAVENOUS | Status: DC | PRN
  Administered 2021-04-02: 10 mL

## 2021-04-02 NOTE — Assessment & Plan Note (Signed)
Renal function is stable. Monitor closely She was seen by nephrologist recently 

## 2021-04-02 NOTE — Assessment & Plan Note (Signed)
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

## 2021-04-02 NOTE — Progress Notes (Signed)
[name] OFFICE PROGRESS NOTE  Patient Care Team: Burnard Bunting, [name]  ASSESSMENT & PLAN:  Endometrial cancer [name] She tolerated single agent trastuzumab well She has no signs of cancer recurrence I have reviewed CT imaging with the patient which is stable Her next CT imaging will be in 6 months, March 2023 I plan to continue echocardiogram monitoring every 3 months, next due in November  CKD (chronic kidney disease), stage III ([place]) Renal function is stable. Monitor closely She was seen by nephrologist recently  Deficiency anemia She has multifactorial anemia, anemia secondary to prior chemotherapy as well as chronic kidney disease Anemia is improving/stable Recent vitamin B12 level was adequate She is not symptomatic Observe only  No orders of the defined types were placed in this encounter.   All questions were answered. The patient knows to call the clinic with any problems, questions or concerns. The total time spent in the appointment was 20 minutes encounter with patients including review of chart and various tests results, discussions about plan of care and coordination of care plan   [name], MD 04/02/2021 11:31 AM  INTERVAL HISTORY: Please see below for problem oriented charting. she returns for treatment follow-up on [name] for recurrent metastatic uterine cancer She is doing well Denies recent signs or symptoms of congestive heart failure No recent infection  REVIEW OF SYSTEMS:   Constitutional: Denies fevers, chills or abnormal weight loss Eyes: Denies blurriness of vision Ears, nose, mouth, throat, and face: Denies mucositis or sore throat Respiratory: Denies cough, dyspnea or wheezes Cardiovascular: Denies palpitation, chest discomfort or lower extremity swelling Gastrointestinal:  Denies nausea, heartburn or change in bowel habits Skin: Denies abnormal skin rashes Lymphatics: Denies new  lymphadenopathy or easy bruising Neurological:Denies numbness, tingling or new weaknesses Behavioral/Psych: Mood is stable, no new changes  All other systems were reviewed with the patient and are negative.  I have reviewed the past medical history, past surgical history, social history and family history with the patient and they are unchanged from previous note.  ALLERGIES:  has No Known Allergies.  MEDICATIONS:  Current Outpatient Medications  Medication Sig Dispense Refill   acetaminophen (TYLENOL) 500 MG tablet Take 1,000 mg by mouth every 6 (six) hours as needed.     aspirin EC 81 MG tablet Take 81 mg by mouth daily.     atorvastatin (LIPITOR) 20 MG tablet Take 20 mg by mouth every evening.      Calcium Carbonate-Vitamin D (CALCIUM-VITAMIN D) 500-200 MG-UNIT per tablet Take 1 tablet by mouth daily.     citalopram (CELEXA) 20 MG tablet Take 20 mg by mouth at bedtime.      estradiol (ESTRACE VAGINAL) 0.1 MG/GM vaginal cream Place 1 Applicatorful vaginally 3 (three) times a week. 42.5 g 12   gabapentin (NEURONTIN) 600 MG tablet Take 600 mg by mouth at bedtime as needed.     levothyroxine (SYNTHROID, LEVOTHROID) 100 MCG tablet Take 100 mcg by mouth daily before breakfast.      lidocaine-prilocaine (EMLA) cream Apply to affected area once 30 g 3   losartan-hydrochlorothiazide (HYZAAR) 100-25 MG tablet      magnesium oxide (MAG-OX) 400 (241.3 Mg) MG tablet Take 1 tablet (400 mg total) by mouth daily. 30 tablet 11   metoprolol succinate (TOPROL-XL) 25 MG 24 hr tablet Take 25 mg by mouth daily.     Multiple Vitamin (MULTIVITAMIN WITH MINERALS) TABS Take 1 tablet by mouth daily.  ondansetron (ZOFRAN) 8 MG tablet Take 1 tablet (8 mg total) by mouth every 8 (eight) hours as needed. Start on the third day after chemotherapy. 30 tablet 1   pantoprazole (PROTONIX) 40 MG tablet Take 40 mg by mouth every morning.  (Patient not taking: Reported on 02/15/2020)     valACYclovir (VALTREX) 1000 MG  tablet valacyclovir 1 gram tablet     No current facility-administered medications for this visit.   Facility-Administered Medications Ordered in Other Visits  Medication Dose Route Frequency Provider Last Rate Last Admin   acetaminophen (TYLENOL) tablet 650 mg  650 mg Oral Once Alvy Bimler, Christian Treadway, MD       diphenhydrAMINE (BENADRYL) tablet 25 mg  25 mg Oral Once Alvy Bimler, Ravin Bendall, MD       heparin lock flush 100 unit/mL  500 Units Intracatheter Once PRN Alvy Bimler, Farley Crooker, MD       sodium chloride flush (NS) 0.9 % injection 10 mL  10 mL Intracatheter PRN Alvy Bimler, Sharifa Bucholz, MD       trastuzumab-anns (KANJINTI) 378 mg in sodium chloride 0.9 % 250 mL chemo infusion  6 mg/kg (Treatment Plan Recorded) Intravenous Once [name], MD        SUMMARY OF ONCOLOGIC HISTORY: Oncology History Overview Note  Vicki Cervantes  has a remote history of left  breast cancer at age 69 but received BRCA testing approximately 5 years ago which was negative. Her cancer was treated with surgery but no radiation or chemotherapy.   Serous endometrial cancer, MSI stable ER positive, PR neg, Her2/neu 3+      Endometrial cancer ([place])  08/29/2016 Pathology Results   Endometrium, biopsy - HIGH GRADE ENDOMETRIAL CARCINOMA, SEE COMMENT. Microscopic Comment The sections show multiple fragments of adenocarcinoma displaying glandular and papillary patterns associated with high grade cytomorphology characterized by nuclear pleomorphism, prominent nucleoli and brisk mitosis. Immunohistochemical stains show that the tumor cells are positive for vimentin, p16, p53 with increased Ki-67 expression. Estrogen and progesterone receptor stains show patchy weak positivity. No significant positivity is seen with CEA. The findings are consistent with high grade endometrial carcinoma and the overall morphology and phenotypic features favor serous carcinoma.   08/29/2016 Initial Diagnosis   She presented with postmenopausal bleeding   10/03/2016 Imaging   CT  C/A/P 09/2016 IMPRESSION: 1. Thickening of the endometrial canal up to 19 mm in fundus, presumably corresponding to the patient's reported endometrial carcinoma. 2. Multiple tiny pulmonary nodules scattered throughout the lungs bilaterally measuring 4 mm or less in size. Nodules of this size are typically considered statistically likely benign. In the setting of known primary malignancy, metastatic disease to the lungs is not excluded, but is not strongly favored on today's examination. Attention on followup studies is recommended to ensure the stability or resolution of these nodules. 3. Subcentimeter low-attenuation lesion in the central aspect of segment 8 of the liver is too small to characterize. This is statistically likely a tiny cyst, but warrants attention on follow-up studies to exclude the possibility of a solitary hepatic metastasis. 4. 1.5 x 1.5 x 1.7 cm well-circumscribed lesion in the proximal stomach. This is of uncertain etiology and significance, and could represent a benign gastric polyp, however, further evaluation with nonemergent endoscopy is suggested in the near future for further evaluation. 5. **An incidental finding of potential clinical significance has been found. 1.1 x 1.6 cm thyroid nodule in the inferior aspect of the right lobe of the thyroid gland. Follow-up evaluation with nonemergent thyroid ultrasound is recommended in the  near future to better evaluate this finding. This recommendation follows ACR consensuss guidelines: Managing Incidental Thyroid Nodules Detected on Imaging: White Paper of the ACR Incidental Thyroid Findings Committee. J Am Coll Radiol 2015;12(2):143-150.** 6. Aortic atherosclerosis, in addition to left anterior descending coronary artery disease   10/22/2016 Surgery   Robotic assisted total hysterectomy, BSO and bilateral pelvic lymphadenectomy  Final pathology revealed a 3cm polyp containing serous carcinoma but with no myometrial invasion, no LVSI  and negative nodes.  Stage IA Uterine serous cancer   10/22/2016 Pathology Results   1. Lymph nodes, regional resection, right pelvic - SIX BENIGN LYMPH NODES (0/6). 2. Lymph nodes, regional resection, left pelvic - SEVEN BENIGN LYMPH NODES (0/7). 3. Uterus +/- tubes/ovaries, neoplastic, with right ovary and fallopian tube ENDOMYOMETRIUM - SEROUS CARCINOMA ARISING WITHIN AN ENDOMETRIAL POLYP - NO MYOMETRIAL INVASION IDENTIFIED - ADENOMYOSIS - LEIOMYOMA (1 CM) - SEE ONCOLOGY TABLE AND COMMENT CERVIX - CARCINOMA FOCALLY INVOLVES ENDOCERVICAL GLANDS - NABOTHIAN CYSTS RIGHT ADNEXA - BENIGN OVARY AND FALLOPIAN TUBE - NO CARCINOMA IDENTIFIED 4. Cul-de-sac biopsy - MESOTHELIAL HYPERPLASIA Microscopic Comment 3. ONCOLOGY TABLE-UTERUS, CARCINOMA OR CARCINOSARCOMA Specimen: Uterus, right fallopian tube and ovary Procedure: Total hysterectomy and right salpingo-oophorectomy Lymph node sampling performed: Bilateral pelvic regional resection Specimen integrity: Intact Maximum tumor size: 3 cm (polyp) Histologic type: Serous carcinoma Grade: High grade Myometrial invasion: Not identified Cervical stromal involvement: No, focal endocervical gland involvement Extent of involvement of other organs: Not identified Lymph - vascular invasion: Not identified Peritoneal washings: N/A Lymph nodes: Examined: 0 Sentinel 13 Non-sentinel 13 Total Lymph nodes with metastasis: 0 Isolated tumor cells (< 0.2 mm): 0 Micrometastasis: (> 0.2 mm and < 2.0 mm): 0 Macrometastasis: (> 2.0 mm): 0 Extracapsular extension: N/A Pelvic lymph nodes: 0 involved of 13 lymph nodes. Para-aortic lymph nodes: No para-aortic nodes submitted TNM code: pT1a, pNX FIGO Stage (based on pathologic findings, needs clinical correlation): IA Comment: Immunohistochemistry for cytokeratin AE1/AE3 is performed on all of the lymph nodes (parts 1 & 2) and no metastatic carcinoma is identified.   07/04/2017 Genetic Testing   The  patient had genetic testing due to a personal history of breast and uterine cancer, and a family history of stomach cancer.  The Multi-Cancer Panel was ordered. The Multi-Cancer Panel offered by [facility] includes sequencing and/or deletion duplication testing of the following 83 genes: ALK, APC, ATM, AXIN2,BAP1,  BARD1, BLM, BMPR1A, BRCA1, BRCA2, BRIP1, CASR, CDC73, CDH1, CDK4, CDKN1B, CDKN1C, CDKN2A (p14ARF), CDKN2A (p16INK4a), CEBPA, CHEK2, CTNNA1, DICER1, DIS3L2, EGFR (c.2369C>T, p.Thr790Met variant only), EPCAM (Deletion/duplication testing only), FH, FLCN, GATA2, GPC3, GREM1 (Promoter region deletion/duplication testing only), HOXB13 (c.251G>A, p.Gly84Glu), HRAS, KIT, MAX, MEN1, MET, MITF (c.952G>A, p.Glu318Lys variant only), MLH1, MSH2, MSH3, MSH6, MUTYH, NBN, NF1, NF2, NTHL1, PALB2, PDGFRA, PHOX2B, PMS2, POLD1, POLE, POT1, PRKAR1A, PTCH1, PTEN, RAD50, RAD51C, RAD51D, RB1, RECQL4, RET, RUNX1, SDHAF2, SDHA (sequence changes only), SDHB, SDHC, SDHD, SMAD4, SMARCA4, SMARCB1, SMARCE1, STK11, SUFU, TERC, TERT, TMEM127, TP53, TSC1, TSC2, VHL, WRN and WT1.   Results: No pathogenic variants were identified.  A variant of uncertain significance in the gene APC was identified.  c.791A>G (p.Gln264Arg).  The date of this test report is 07/04/2017.     Genetic Testing   Patient has genetic testing done for MSI. 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    12/03/2017 Imaging   CT Chest/Abd/Pelvis to follow pulmonary nodule and gastric mass IMPRESSION: 1. Stable CT of the chest. Small pulmonary nodules are unchanged when compared  with previous exam. 2. No new findings identified. 3. Subcentimeter low-attenuation lesions within the liver are remain too small to characterize but are stable from prior exam. 4. Persistent indeterminate low-attenuation structure within the proximal stomach is unchanged measuring 1.4 cm. Correlation with direct visualization is advised    06/30/2018 Imaging   CT CHEST Lungs/Pleura: Stable scattered sub-cm pulmonary nodules are again seen bilaterally and are stable compared to previous studies. No new or enlarging pulmonary nodules or masses identified. No evidence of pulmonary infiltrate or pleural effusion.   08/19/2018 Echocardiogram   [place] is done in [place]: EF 60-65%. Mild impaired relaxation. (report scanned)   09/17/2018 Relapse/Recurrence   Presented with c/o vagina discharge.  Lesion noted at the left vaginal apex 43mm lesion removed Path c/w high grade serous cancer   09/17/2018 Pathology Results   Vagina, biopsy, left apex - HIGH GRADE SEROUS CARCINOMA.   09/28/2018 PET scan   1. Two hypermetabolic axial lymph nodes. Unusual site for metastatic endometrial carcinoma however the activity is more intense than typically seen in reactive adenopathy. Suggest ultrasound-guided percutaneous biopsy of the larger RIGHT axial lymph node 2. No evidence of local recurrence at the vaginal cuff.  3. No metastatic adenopathy in the abdomen or pelvis. 4. Stable small pulmonary nodules   10/09/2018 Pathology Results   Lymph node, needle/core biopsy, right axilla - METASTATIC CARCINOMA, SEE COMMENT. Microscopic Comment The carcinoma appears high grade. Immunohistochemistry is positive for cytokeratin 7, PAX-8, and ER. Cytokeratin 20, CDX-2, PR, and GATA-3 are negative. The findings along with the history are consistent with a gynecologic primary.    10/09/2018 Procedure   Ultrasound-guided core biopsies of a right axillary lymph node.   10/14/2018 Cancer Staging   Staging form: Corpus Uteri - Carcinoma and Carcinosarcoma, AJCC 8th Edition - Clinical: Stage IVB (cT1, cN0, pM1) - Signed by [name], MD on 10/14/2018   10/19/2018 Imaging   Placement of single lumen port a cath via right internal jugular vein. The catheter tip lies at the cavo-atrial junction. A power injectable port a cath was placed and is ready for immediate use.     10/21/2018 -  Chemotherapy   The patient had carboplatin and taxol for treatment   11/11/2018 -  Chemotherapy      Patient is on Antibody Plan: BREAST TRASTUZUMAB Q21D     12/21/2018 PET scan   1. Interval resolution of hypermetabolic right axillary lymph nodes. No metabolic findings highly suspicious for recurrent metastatic disease. 2. New mild hypermetabolism within a borderline prominent portacaval lymph node, nonspecific. While a reactive node is favored, a lymph node metastasis cannot be entirely excluded. Suggest attention to this lymph node on follow-up PET-CT in 3-6 months. 3. Nonspecific new hypermetabolism at the ileocecal valve, more likely physiologic given absence of CT correlate. 4. Scattered subcentimeter pulmonary nodules are all stable and below PET resolution, more likely benign, continued CT surveillance advised. 5. Chronic findings include: Aortic Atherosclerosis (ICD10-I70.0). Marked diffuse colonic diverticulosis. Coronary atherosclerosis.     12/28/2018 Echocardiogram   1. The left ventricle has normal systolic function with an ejection fraction of 60-65%. The cavity size was normal. Left ventricular diastolic Doppler parameters are consistent with impaired relaxation.  2. GLS recorded as -10.7 but LV appears hyperdynamic and tracking of endocardium appears poor.  3. The right ventricle has normal systolic function. The cavity was normal. There is no increase in right ventricular wall thickness.  4. Mild thickening of the mitral valve leaflet.  5. The aortic  valve was not well visualized. Mild thickening of the aortic valve. Aortic valve regurgitation is trivial by color flow Doppler.   03/23/2019 Imaging   PET 1. No findings of hypermetabolic residual/recurrent or metastatic disease. 2. Similar low-level hypermetabolism within normal sized portocaval and right inguinal nodes, favored to be reactive. 3. Ongoing stability of small bilateral pulmonary nodules,  favored to be benign. Below PET resolution. 4. Coronary artery atherosclerosis. Aortic Atherosclerosis   03/26/2019 Echocardiogram   1. The left ventricle has normal systolic function, with an ejection fraction of 55-60%. The cavity size was normal. Left ventricular diastolic Doppler parameters are consistent with impaired relaxation.  2. The right ventricle has normal systolic function. The cavity was normal.  3. The mitral valve is abnormal. Mild thickening of the mitral valve leaflet. There is mild mitral annular calcification present.  4. The tricuspid valve is grossly normal.  5. The aortic valve is tricuspid. Mild calcification of the aortic valve. No stenosis of the aortic valve.  6. The aorta is normal unless otherwise noted.  7. Normal LV systolic function; grade 1 diastolic dysfunction; QIH-47.4%.   06/30/2019 Imaging   Chest Impression:   1. No evidence thoracic metastasis. 2. Stable small benign-appearing pulmonary nodules   Abdomen / Pelvis Impression:   1. No evidence of metastatic disease in the abdomen pelvis. 2.  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 3.  Aortic Atherosclerosis (ICD10-I70.0).   06/30/2019 Echocardiogram    1. Left ventricular ejection fraction, by visual estimation, is 60 to 65%. The left ventricle has normal function. There is no left ventricular hypertrophy.  2. Left ventricular diastolic parameters are consistent with Grade I diastolic dysfunction (impaired relaxation).  3. The left ventricle has no regional wall motion abnormalities.  4. Global right ventricle has normal systolic function.The right ventricular size is normal. No increase in right ventricular wall thickness.  5. Left atrial size was mild-moderately dilated.  6. Right atrial size was normal.  7. The mitral valve is normal in structure. Trivial mitral valve regurgitation.  8. The tricuspid valve is normal in structure. Tricuspid valve regurgitation is trivial.  9.  The aortic valve is normal in structure. Aortic valve regurgitation is trivial. No evidence of aortic valve sclerosis or stenosis. 10. The pulmonic valve was grossly normal. Pulmonic valve regurgitation is not visualized. 11. Mildly elevated pulmonary artery systolic pressure. 12. The inferior vena cava is normal in size with greater than 50% respiratory variability, suggesting right atrial pressure of 3 mmHg. 13. The average left ventricular global longitudinal strain is -12.5 %. GLS underestimated due to poor endocardial tracking.     09/27/2019 Imaging   1. Multiple small, nonspecific pulmonary nodules are again noted. The previously noted lung nodules are unchanged in size from previous exam. There is a new lung nodule within the medial right lower lobe which is nonspecific measure 4 mm. Attention at follow-up imaging is recommended. 2. No evidence of metastatic disease within the abdomen or pelvis. 3. Coronary artery calcifications.  The 4.  Aortic Atherosclerosis (ICD10-I70.0).   09/30/2019 Echocardiogram    1. Left ventricular ejection fraction, by estimation, is 60 to 65%. The left ventricle has normal function. The left ventricle has no regional wall motion abnormalities. Left ventricular diastolic parameters are consistent with Grade I diastolic dysfunction (impaired relaxation).  2. Right ventricular systolic function is normal. The right ventricular size is normal. There is normal pulmonary artery systolic pressure. The estimated right ventricular systolic pressure is 25.9 mmHg.  3. The  mitral valve is normal in structure. No evidence of mitral valve regurgitation. No evidence of mitral stenosis.  4. The aortic valve is normal in structure. Aortic valve regurgitation is trivial. No aortic stenosis is present.  5. The inferior vena cava is normal in size with greater than 50% respiratory variability, suggesting right atrial pressure of 3 mmHg.     01/07/2020 Echocardiogram    1. Normal  LV function; grade 1 diastolic dysfunction; mild AI; GLS -21%.  2. Left ventricular ejection fraction, by estimation, is 55 to 60%. The left ventricle has normal function. The left ventricle has no regional wall motion abnormalities. Left ventricular diastolic parameters are consistent with Grade I diastolic dysfunction (impaired relaxation).  3. Right ventricular systolic function is normal. The right ventricular size is normal.  4. The mitral valve is normal in structure. Trivial mitral valve regurgitation. No evidence of mitral stenosis.  5. The aortic valve is tricuspid. Aortic valve regurgitation is mild. Mild aortic valve sclerosis is present, with no evidence of aortic valve stenosis.  6. The inferior vena cava is normal in size with greater than 50% respiratory variability, suggesting right atrial pressure of 3 mmHg.     03/27/2020 Imaging   1. Status post hysterectomy. No evidence of recurrent or metastatic disease in the abdomen or pelvis. 2. Multiple small pulmonary nodules in the lung bases are unchanged to the extent that they are included on current examination and remain most likely sequelae of prior infection or inflammation. Attention on follow-up. 3. Aortic Atherosclerosis (ICD10-I70.0).   04/17/2020 Echocardiogram   1. Normal LVEF, normal and unchanged GLS: -20.2%.  2. Left ventricular ejection fraction, by estimation, is 60 to 65%. The left ventricle has normal function. The left ventricle has no regional wall motion abnormalities. There is mild concentric left ventricular hypertrophy. Left ventricular diastolic parameters are consistent with Grade I diastolic dysfunction (impaired relaxation). The average left ventricular global longitudinal strain is -20.2 %. The global longitudinal strain is normal.  3. Right ventricular systolic function is normal. The right ventricular size is normal.  4. The mitral valve is normal in structure. Mild mitral valve regurgitation. No evidence of  mitral stenosis.  5. The aortic valve is normal in structure. There is mild calcification of the aortic valve. There is mild thickening of the aortic valve. Aortic valve regurgitation is mild. No aortic stenosis is present.  6. The inferior vena cava is normal in size with greater than 50% respiratory variability, suggesting right atrial pressure of 3 mmHg.   08/02/2020 Echocardiogram   1. Left ventricular ejection fraction, by estimation, is 55 to 60%. The left ventricle has normal function. The left ventricle has no regional wall motion abnormalities. Left ventricular diastolic parameters are consistent with Grade I diastolic dysfunction (impaired relaxation). The average left ventricular global longitudinal strain is -27.0 %. The global longitudinal strain is normal.  2. Right ventricular systolic function is normal. The right ventricular size is normal. Tricuspid regurgitation signal is inadequate for assessing PA pressure.  3. The mitral valve is grossly normal. Trivial mitral valve regurgitation. No evidence of mitral stenosis.  4. The aortic valve is tricuspid. Aortic valve regurgitation is mild. No aortic stenosis is present.  5. The inferior vena cava is normal in size with greater than 50% respiratory variability, suggesting right atrial pressure of 3 mmHg.   09/19/2020 Imaging   1. Status post hysterectomy. No evidence of recurrent or new metastatic disease within the chest, abdomen, or pelvis. 2. Scattered bilateral tiny  pulmonary nodules, unchanged from prior studies and most likely sequela of prior infection or inflammation, recommend attention on follow-up imaging. No new suspicious pulmonary nodules or masses. 3. Subcutaneous edema and small hematoma overlying the posterior gluteal musculature, recommend correlation with recent history of trauma. 4. Mild low-density wall thickening of the gastric antrum, which may represent gastritis. 5. Hepatic steatosis. 6. Extensive sigmoid colonic  diverticulosis without findings of acute diverticulitis. 7. Aortic atherosclerosis.  Aortic Atherosclerosis (ICD10-I70.0).   11/20/2020 Echocardiogram    1. Compared to echo from Jan 2022, Global longitudinal strain is less negative. (Previously -27%). Left ventricular ejection fraction, by estimation, is 60 to 65%. The left ventricle has normal function. The left ventricle has no regional wall motion abnormalities. There is mild left ventricular hypertrophy. Left ventricular diastolic parameters are indeterminate.  2. Right ventricular systolic function is normal. The right ventricular size is normal.  3. The mitral valve is normal in structure. Trivial mitral valve regurgitation.  4. Aortic valve regurgitation is mild. Mild aortic valve sclerosis is present, with no evidence of aortic valve stenosis.     03/12/2021 Echocardiogram   1. Left ventricular ejection fraction, by estimation, is 60 to 65%. Left ventricular ejection fraction by 2D MOD biplane is 63.6 %. The left ventricle has normal function. The left ventricle has no regional wall motion abnormalities. There is mild left ventricular hypertrophy. Left ventricular diastolic parameters are consistent with Grade I diastolic dysfunction (impaired relaxation). The average left ventricular global longitudinal strain is -20.0 %. The global longitudinal strain is normal.  2. Right ventricular systolic function is normal. The right ventricular size is normal. There is normal pulmonary artery systolic pressure. The estimated right ventricular systolic pressure is 16.9 mmHg.  3. The mitral valve is normal in structure. No evidence of mitral valve regurgitation. No evidence of mitral stenosis.  4. The aortic valve is tricuspid. Aortic valve regurgitation is trivial. No aortic stenosis is present. Aortic regurgitation PHT measures 403 msec.  5. The inferior vena cava is normal in size with greater than 50% respiratory variability, suggesting right atrial  pressure of 3 mmHg.   04/02/2021 Imaging   Stable small pulmonary nodules scattered throughout the chest, unchanged, without new or suspicious pulmonary nodule.   Three-vessel coronary artery disease.   Aortic Atherosclerosis (ICD10-I70.0).   Metastasis to lymph nodes ([place])  10/14/2018 Initial Diagnosis   Metastasis to lymph nodes ([place])   10/21/2018 - 02/24/2019 Chemotherapy   The patient had palonosetron (ALOXI) injection 0.25 mg, 0.25 mg, Intravenous,  Once, 7 of 7 cycles Administration: 0.25 mg (10/21/2018), 0.25 mg (11/11/2018), 0.25 mg (12/02/2018), 0.25 mg (12/23/2018), 0.25 mg (01/13/2019), 0.25 mg (02/03/2019), 0.25 mg (02/24/2019) CARBOplatin (PARAPLATIN) 310 mg in sodium chloride 0.9 % 250 mL chemo infusion, 310 mg, Intravenous,  Once, 7 of 7 cycles Dose modification: 300 mg (original dose 311.5 mg, Cycle 4, Reason: Dose not tolerated) Administration: 310 mg (10/21/2018), 300 mg (11/11/2018), 300 mg (12/02/2018), 300 mg (12/23/2018), 300 mg (01/13/2019), 300 mg (02/03/2019), 300 mg (02/24/2019) PACLitaxel (TAXOL) 222 mg in sodium chloride 0.9 % 250 mL chemo infusion (> $RemoveBef'80mg'FPHzsMNmuS$ /m2), 135 mg/m2 = 222 mg, Intravenous,  Once, 7 of 7 cycles Administration: 222 mg (10/21/2018), 222 mg (11/11/2018), 222 mg (12/02/2018), 222 mg (12/23/2018), 222 mg (01/13/2019), 222 mg (02/03/2019), 222 mg (02/24/2019) fosaprepitant (EMEND) 150 mg, dexamethasone (DECADRON) 12 mg in sodium chloride 0.9 % 145 mL IVPB, , Intravenous,  Once, 7 of 7 cycles Administration:  (10/21/2018),  (11/11/2018),  (12/02/2018),  (12/23/2018),  (  01/13/2019),  (02/03/2019),  (02/24/2019)   for chemotherapy treatment.       PHYSICAL EXAMINATION: ECOG PERFORMANCE STATUS: 0 - Asymptomatic  Vitals:   04/02/21 1059  BP: 139/60  Pulse: 65  Resp: 18  SpO2: 98%   Filed Weights   04/02/21 1059  Weight: 132 lb (59.9 kg)    GENERAL:alert, no distress and comfortable NEURO: alert & oriented x 3 with fluent speech, no focal motor/sensory deficits  LABORATORY  DATA:  I have reviewed the data as listed    Component Value Date/Time   NA 141 04/02/2021 1027   NA 139 02/13/2017 1512   K 3.7 04/02/2021 1027   K 4.2 02/13/2017 1512   CL 105 04/02/2021 1027   CO2 25 04/02/2021 1027   CO2 28 02/13/2017 1512   GLUCOSE 103 (H) 04/02/2021 1027   GLUCOSE 118 02/13/2017 1512   BUN 32 (H) 04/02/2021 1027   BUN 25.2 02/13/2017 1512   CREATININE 1.39 (H) 04/02/2021 1027   CREATININE 1.37 (H) 12/21/2019 0850   CREATININE 1.2 (H) 02/13/2017 1512   CALCIUM 10.6 (H) 04/02/2021 1027   CALCIUM 10.0 02/13/2017 1512   PROT 7.0 04/02/2021 1027   ALBUMIN 4.0 04/02/2021 1027   AST 17 04/02/2021 1027   AST 19 12/21/2019 0850   ALT 14 04/02/2021 1027   ALT 13 12/21/2019 0850   ALKPHOS 67 04/02/2021 1027   BILITOT 0.4 04/02/2021 1027   BILITOT 0.4 12/21/2019 0850   GFRNONAA 38 (L) 04/02/2021 1027   GFRNONAA 37 (L) 12/21/2019 0850   GFRAA 47 (L) 03/28/2020 0933   GFRAA 42 (L) 12/21/2019 0850    No results found for: SPEP, UPEP  Lab Results  Component Value Date   WBC 5.6 04/02/2021   NEUTROABS 3.3 04/02/2021   HGB 10.7 (L) 04/02/2021   HCT 29.8 (L) 04/02/2021   MCV 89.2 04/02/2021   PLT 232 04/02/2021      Chemistry      Component Value Date/Time   NA 141 04/02/2021 1027   NA 139 02/13/2017 1512   K 3.7 04/02/2021 1027   K 4.2 02/13/2017 1512   CL 105 04/02/2021 1027   CO2 25 04/02/2021 1027   CO2 28 02/13/2017 1512   BUN 32 (H) 04/02/2021 1027   BUN 25.2 02/13/2017 1512   CREATININE 1.39 (H) 04/02/2021 1027   CREATININE 1.37 (H) 12/21/2019 0850   CREATININE 1.2 (H) 02/13/2017 1512      Component Value Date/Time   CALCIUM 10.6 (H) 04/02/2021 1027   CALCIUM 10.0 02/13/2017 1512   ALKPHOS 67 04/02/2021 1027   AST 17 04/02/2021 1027   AST 19 12/21/2019 0850   ALT 14 04/02/2021 1027   ALT 13 12/21/2019 0850   BILITOT 0.4 04/02/2021 1027   BILITOT 0.4 12/21/2019 0850       RADIOGRAPHIC STUDIES: I have reviewed multiple imaging  studies with the patient I have personally reviewed the radiological images as listed and agreed with the findings in the report. 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 Also post bilateral mastectomy. EXAM: CT CHEST WITHOUT CONTRAST TECHNIQUE: Multidetector CT imaging of the chest was performed following the standard protocol without IV contrast. COMPARISON:  Comparison made with September 18, 2020. FINDINGS: Cardiovascular: Calcified atheromatous plaque throughout the thoracic aorta. No aneurysmal dilation. Three-vessel coronary artery disease. Normal heart size. Central pulmonary vasculature is unremarkable in terms of caliber.  Limited assessment of cardiovascular structures given lack of intravenous contrast. RIGHT-sided Port-A-Cath terminates in the distal superior vena cava. Mediastinum/Nodes: No thoracic inlet, axillary, mediastinal or hilar lymphadenopathy. Esophagus is grossly normal. Lungs/Pleura: Pulmonary nodules scattered throughout the chest show no change compared to previous imaging LEFT upper lobe pulmonary nodule (image 80/5) 4 mm nodule along the LEFT heart border. (Image 80/5) 3 mm nodule along the minor fissure in the RIGHT chest. Other scattered small pulmonary nodules similarly stable. No effusion. No consolidative process. Upper Abdomen: Imaged portions the liver, gallbladder, pancreas, spleen, adrenal glands and kidneys without acute process. No upper abdominal lymphadenopathy. Musculoskeletal: Bilateral breast implants in place. Spinal degenerative changes. No acute or destructive bone process. IMPRESSION: Stable small pulmonary nodules scattered throughout the chest, unchanged, without new or suspicious pulmonary nodule. Three-vessel coronary artery disease. Aortic Atherosclerosis (ICD10-I70.0). Electronically Signed   By: Zetta Bills M.D.   On: 04/01/2021 17:40    ECHOCARDIOGRAM COMPLETE  Result Date: 03/12/2021    ECHOCARDIOGRAM REPORT   Patient Name:   Vicki Cervantes Date of Exam: 03/12/2021 Medical Rec #:  [id number]      Height:       62.2 in Accession #:    [id number]     Weight:       133.5 lb Date of Birth:  Nov 21, 1940      BSA:          1.614 m Patient Age:    80 years       BP:           132/74 mmHg Patient Gender: F              HR:           75 bpm. Exam Location:  Outpatient Procedure: 2D Echo, 3D Echo, Cardiac Doppler, Color Doppler and Strain Analysis Indications:    Z51.11 Encounter for antineoplastic chemotheraphy  History:        Patient has prior history of Echocardiogram examinations, most                 recent 11/20/2020. Risk Factors:Hypertension and Dyslipidemia.                 Breast cancer. Endometrial cancer.  Sonographer:    Roseanna Rainbow RDCS Referring Phys: [id number] Geniya Fulgham [facility]  Sonographer Comments: Image acquisition challenging due to breast implants and Image acquisition challenging due to mastectomy. Global longitudinal strain was attempted. IMPRESSIONS  1. Left ventricular ejection fraction, by estimation, is 60 to 65%. Left ventricular ejection fraction by 2D MOD biplane is 63.6 %. The left ventricle has normal function. The left ventricle has no regional wall motion abnormalities. There is mild left ventricular hypertrophy. Left ventricular diastolic parameters are consistent with Grade I diastolic dysfunction (impaired relaxation). The average left ventricular global longitudinal strain is -20.0 %. The global longitudinal strain is normal.  2. Right ventricular systolic function is normal. The right ventricular size is normal. There is normal pulmonary artery systolic pressure. The estimated right ventricular systolic pressure is 81.2 mmHg.  3. The mitral valve is normal in structure. No evidence of mitral valve regurgitation. No evidence of mitral stenosis.  4. The aortic valve is tricuspid. Aortic valve regurgitation is trivial. No aortic  stenosis is present. Aortic regurgitation PHT measures 403 msec.  5. The inferior vena cava is normal in size with greater than 50% respiratory variability, suggesting right atrial pressure of 3 mmHg. Comparison(s): No significant change from prior study. 08/02/2020: LVEF 55-60%,  GLS -27%. FINDINGS  Left Ventricle: Left ventricular ejection fraction, by estimation, is 60 to 65%. Left ventricular ejection fraction by 2D MOD biplane is 63.6 %. The left ventricle has normal function. The left ventricle has no regional wall motion abnormalities. The average left ventricular global longitudinal strain is -20.0 %. The global longitudinal strain is normal. The left ventricular internal cavity size was normal in size. There is mild left ventricular hypertrophy. Left ventricular diastolic parameters are consistent with Grade I diastolic dysfunction (impaired relaxation). Indeterminate filling pressures. Right Ventricle: The right ventricular size is normal. No increase in right ventricular wall thickness. Right ventricular systolic function is normal. There is normal pulmonary artery systolic pressure. The tricuspid regurgitant velocity is 2.06 m/s, and  with an assumed right atrial pressure of 3 mmHg, the estimated right ventricular systolic pressure is 93.1 mmHg. Left Atrium: Left atrial size was normal in size. Right Atrium: Right atrial size was normal in size. Pericardium: There is no evidence of pericardial effusion. Mitral Valve: The mitral valve is normal in structure. No evidence of mitral valve regurgitation. No evidence of mitral valve stenosis. Tricuspid Valve: The tricuspid valve is grossly normal. Tricuspid valve regurgitation is trivial. No evidence of tricuspid stenosis. Aortic Valve: The aortic valve is tricuspid. Aortic valve regurgitation is trivial. Aortic regurgitation PHT measures 403 msec. No aortic stenosis is present. Pulmonic Valve: The pulmonic valve was normal in structure. Pulmonic valve  regurgitation is not visualized. No evidence of pulmonic stenosis. Aorta: The aortic root and ascending aorta are structurally normal, with no evidence of dilitation. Venous: The inferior vena cava is normal in size with greater than 50% respiratory variability, suggesting right atrial pressure of 3 mmHg. IAS/Shunts: No atrial level shunt detected by color flow Doppler.  LEFT VENTRICLE PLAX 2D                        Biplane EF (MOD) LVIDd:         3.50 cm         LV Biplane EF:   Left LVIDs:         2.30 cm                          ventricular LV PW:         1.20 cm                          ejection LV IVS:        1.30 cm                          fraction by LVOT diam:     2.00 cm                          2D MOD LV SV:         62                               biplane is LV SV Index:   38                               63.6 %. LVOT Area:     3.14 cm  Diastology                                LV e' medial:    8.16 cm/s LV Volumes (MOD)               LV E/e' medial:  8.3 LV vol d, MOD    61.8 ml       LV e' lateral:   8.27 cm/s A2C:                           LV E/e' lateral: 8.2 LV vol d, MOD    58.4 ml A4C:                           2D LV vol s, MOD    22.5 ml       Longitudinal A2C:                           Strain LV vol s, MOD    20.8 ml       2D Strain GLS  -23.7 % A4C:                           (A2C): LV SV MOD A2C:   39.3 ml       2D Strain GLS  -16.0 % LV SV MOD A4C:   58.4 ml       (A3C): LV SV MOD BP:    39.4 ml       2D Strain GLS  -20.3 %                                (A4C):                                2D Strain GLS  -20.0 %                                Avg: RIGHT VENTRICLE RV S prime:     9.90 cm/s TAPSE (M-mode): 1.8 cm LEFT ATRIUM           Index       RIGHT ATRIUM           Index LA diam:      4.00 cm 2.48 cm/m  RA Area:     15.70 cm LA Vol (A2C): 26.8 ml 16.61 ml/m RA Volume:   37.00 ml  22.93 ml/m LA Vol (A4C): 26.2 ml 16.24 ml/m  AORTIC VALVE LVOT Vmax:    87.80 cm/s LVOT Vmean:  65.100 cm/s LVOT VTI:    0.197 m AI PHT:      403 msec  AORTA Ao Root diam: 3.30 cm Ao Asc diam:  3.10 cm MITRAL VALVE               TRICUSPID VALVE MV Area (PHT): 3.37 cm    TR Peak grad:   17.0 mmHg MV Decel Time: 225 msec    TR Vmax:        206.00 cm/s MV E velocity: 68.10 cm/s MV A velocity: 91.30 cm/s  SHUNTS  MV E/A ratio:  0.75        Systemic VTI:  0.20 m                            Systemic Diam: 2.00 cm Lyman Bishop MD Electronically signed by Lyman Bishop MD Signature Date/Time: 03/12/2021/3:02:06 PM    Final

## 2021-04-02 NOTE — Patient Instructions (Signed)
[facility]  Discharge Instructions: °Thank you for choosing [facility] to provide your oncology and hematology care.  ° °If you have a lab appointment with the Cancer Center, please go directly to the Cancer Center and check in at the registration area. °  °Wear comfortable clothing and clothing appropriate for easy access to any Portacath or PICC line.  ° °We strive to give you quality time with your provider. You may need to reschedule your appointment if you arrive late (15 or more minutes).  Arriving late affects you and other patients whose appointments are after yours.  Also, if you miss three or more appointments without notifying the office, you may be dismissed from the clinic at the provider’s discretion.    °  °For prescription refill requests, have your pharmacy contact our office and allow 72 hours for refills to be completed.   ° °Today you received the following chemotherapy and/or immunotherapy agents: Kanjinti °  °To help prevent nausea and vomiting after your treatment, we encourage you to take your nausea medication as directed. ° °BELOW ARE SYMPTOMS THAT SHOULD BE REPORTED IMMEDIATELY: °*FEVER GREATER THAN 100.4 F (38 °C) OR HIGHER °*CHILLS OR SWEATING °*NAUSEA AND VOMITING THAT IS NOT CONTROLLED WITH YOUR NAUSEA MEDICATION °*UNUSUAL SHORTNESS OF BREATH °*UNUSUAL BRUISING OR BLEEDING °*URINARY PROBLEMS (pain or burning when urinating, or frequent urination) °*BOWEL PROBLEMS (unusual diarrhea, constipation, pain near the anus) °TENDERNESS IN MOUTH AND THROAT WITH OR WITHOUT PRESENCE OF ULCERS (sore throat, sores in mouth, or a toothache) °UNUSUAL RASH, SWELLING OR PAIN  °UNUSUAL VAGINAL DISCHARGE OR ITCHING  ° °Items with * indicate a potential emergency and should be followed up as soon as possible or go to the Emergency Department if any problems should occur. ° °Please show the CHEMOTHERAPY ALERT CARD or IMMUNOTHERAPY ALERT CARD at check-in to the  Emergency Department and triage nurse. ° °Should you have questions after your visit or need to cancel or reschedule your appointment, please contact [facility]  Dept: [phone]  and follow the prompts.  Office hours are 8:00 a.m. to 4:30 p.m. Monday - Friday. Please note that voicemails left after 4:00 p.m. may not be returned until the following business day.  We are closed weekends and major holidays. You have access to a nurse at all times for urgent questions. Please call the main number to the clinic Dept: [phone] and follow the prompts. ° ° °For any non-urgent questions, you may also contact your provider using MyChart. We now offer e-Visits for anyone 18 and older to request care online for non-urgent symptoms. For details visit [email or web address]. °  °Also download the MyChart app! Go to the app store, search "MyChart", open the app, select [place], and log in with your MyChart username and password. ° °Due to Covid, a mask is required upon entering the hospital/clinic. If you do not have a mask, one will be given to you upon arrival. For doctor visits, patients may have 1 support person aged 18 or older with them. For treatment visits, patients cannot have anyone with them due to current Covid guidelines and our immunocompromised population.  ° °

## 2021-04-02 NOTE — Assessment & Plan Note (Signed)
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 

## 2021-04-04 DIAGNOSIS — I10 Essential (primary) hypertension: Secondary | ICD-10-CM | POA: Diagnosis not present

## 2021-04-05 DIAGNOSIS — I10 Essential (primary) hypertension: Secondary | ICD-10-CM | POA: Diagnosis not present

## 2021-04-09 DIAGNOSIS — L570 Actinic keratosis: Secondary | ICD-10-CM | POA: Diagnosis not present

## 2021-04-24 ENCOUNTER — Other Ambulatory Visit: Payer: Self-pay

## 2021-04-24 ENCOUNTER — Inpatient Hospital Stay: Payer: Medicare HMO

## 2021-04-24 ENCOUNTER — Inpatient Hospital Stay: Payer: Medicare HMO | Attending: Gynecologic Oncology

## 2021-04-24 ENCOUNTER — Other Ambulatory Visit: Payer: Self-pay | Admitting: Hematology and Oncology

## 2021-04-24 VITALS — BP 147/67 | HR 60 | Temp 98.5°F | Resp 18 | Wt 131.5 lb

## 2021-04-24 DIAGNOSIS — N183 Chronic kidney disease, stage 3 unspecified: Secondary | ICD-10-CM | POA: Insufficient documentation

## 2021-04-24 DIAGNOSIS — C779 Secondary and unspecified malignant neoplasm of lymph node, unspecified: Secondary | ICD-10-CM | POA: Insufficient documentation

## 2021-04-24 DIAGNOSIS — Z7982 Long term (current) use of aspirin: Secondary | ICD-10-CM | POA: Diagnosis not present

## 2021-04-24 DIAGNOSIS — C541 Malignant neoplasm of endometrium: Secondary | ICD-10-CM

## 2021-04-24 DIAGNOSIS — T451X5D Adverse effect of antineoplastic and immunosuppressive drugs, subsequent encounter: Secondary | ICD-10-CM | POA: Diagnosis not present

## 2021-04-24 DIAGNOSIS — Z79899 Other long term (current) drug therapy: Secondary | ICD-10-CM | POA: Insufficient documentation

## 2021-04-24 DIAGNOSIS — Z5112 Encounter for antineoplastic immunotherapy: Secondary | ICD-10-CM | POA: Diagnosis not present

## 2021-04-24 DIAGNOSIS — D6481 Anemia due to antineoplastic chemotherapy: Secondary | ICD-10-CM | POA: Insufficient documentation

## 2021-04-24 DIAGNOSIS — D631 Anemia in chronic kidney disease: Secondary | ICD-10-CM | POA: Diagnosis not present

## 2021-04-24 LAB — CBC WITH DIFFERENTIAL/PLATELET
Abs Immature Granulocytes: 0.02 10*3/uL (ref 0.00–0.07)
Basophils Absolute: 0.1 10*3/uL (ref 0.0–0.1)
Basophils Relative: 2 %
Eosinophils Absolute: 0.2 10*3/uL (ref 0.0–0.5)
Eosinophils Relative: 4 %
HCT: 30.4 % — ABNORMAL LOW (ref 36.0–46.0)
Hemoglobin: 10.7 g/dL — ABNORMAL LOW (ref 12.0–15.0)
Immature Granulocytes: 0 %
Lymphocytes Relative: 25 %
Lymphs Abs: 1.4 10*3/uL (ref 0.7–4.0)
MCH: 31.2 pg (ref 26.0–34.0)
MCHC: 35.2 g/dL (ref 30.0–36.0)
MCV: 88.6 fL (ref 80.0–100.0)
Monocytes Absolute: 0.4 10*3/uL (ref 0.1–1.0)
Monocytes Relative: 8 %
Neutro Abs: 3.4 10*3/uL (ref 1.7–7.7)
Neutrophils Relative %: 61 %
Platelets: 224 10*3/uL (ref 150–400)
RBC: 3.43 MIL/uL — ABNORMAL LOW (ref 3.87–5.11)
RDW: 12 % (ref 11.5–15.5)
WBC: 5.5 10*3/uL (ref 4.0–10.5)
nRBC: 0 % (ref 0.0–0.2)

## 2021-04-24 LAB — COMPREHENSIVE METABOLIC PANEL
ALT: 15 U/L (ref 0–44)
AST: 22 U/L (ref 15–41)
Albumin: 4 g/dL (ref 3.5–5.0)
Alkaline Phosphatase: 59 U/L (ref 38–126)
Anion gap: 12 (ref 5–15)
BUN: 19 mg/dL (ref 8–23)
CO2: 25 mmol/L (ref 22–32)
Calcium: 10 mg/dL (ref 8.9–10.3)
Chloride: 102 mmol/L (ref 98–111)
Creatinine, Ser: 1.41 mg/dL — ABNORMAL HIGH (ref 0.44–1.00)
GFR, Estimated: 37 mL/min — ABNORMAL LOW (ref >60)
Glucose, Bld: 99 mg/dL (ref 70–99)
Potassium: 4.3 mmol/L (ref 3.5–5.1)
Sodium: 139 mmol/L (ref 135–145)
Total Bilirubin: 0.4 mg/dL (ref 0.3–1.2)
Total Protein: 7 g/dL (ref 6.5–8.1)

## 2021-04-24 MED ORDER — ACETAMINOPHEN 325 MG PO TABS
650.0000 mg | ORAL_TABLET | Freq: Once | ORAL | Status: AC
  Administered 2021-04-24: 650 mg via ORAL
  Filled 2021-04-24: qty 2

## 2021-04-24 MED ORDER — SODIUM CHLORIDE 0.9% FLUSH
10.0000 mL | INTRAVENOUS | Status: DC | PRN
  Administered 2021-04-24: 10 mL

## 2021-04-24 MED ORDER — SODIUM CHLORIDE 0.9% FLUSH
10.0000 mL | Freq: Once | INTRAVENOUS | Status: AC
  Administered 2021-04-24: 10 mL

## 2021-04-24 MED ORDER — SODIUM CHLORIDE 0.9 % IV SOLN
Freq: Once | INTRAVENOUS | Status: AC
Start: 2021-04-24 — End: 2021-04-24

## 2021-04-24 MED ORDER — TRASTUZUMAB-ANNS CHEMO 150 MG IV SOLR
6.0000 mg/kg | Freq: Once | INTRAVENOUS | Status: AC
  Administered 2021-04-24: 378 mg via INTRAVENOUS
  Filled 2021-04-24: qty 18

## 2021-04-24 MED ORDER — HEPARIN SOD (PORK) LOCK FLUSH 100 UNIT/ML IV SOLN
500.0000 [IU] | Freq: Once | INTRAVENOUS | Status: AC | PRN
Start: 2021-04-24 — End: 2021-04-24
  Administered 2021-04-24: 500 [IU]

## 2021-04-24 MED ORDER — DIPHENHYDRAMINE HCL 25 MG PO TABS
25.0000 mg | ORAL_TABLET | Freq: Once | ORAL | Status: AC
Start: 2021-04-24 — End: 2021-04-24
  Filled 2021-04-24: qty 1

## 2021-04-24 MED ORDER — DIPHENHYDRAMINE HCL 25 MG PO CAPS
ORAL_CAPSULE | ORAL | Status: AC
  Administered 2021-04-24: 25 mg via ORAL
  Filled 2021-04-24: qty 1

## 2021-04-24 NOTE — Patient Instructions (Signed)
[facility]  Discharge Instructions: ?Thank you for choosing [facility] to provide your oncology and hematology care.  ? ?If you have a lab appointment with the Cancer Center, please go directly to the Cancer Center and check in at the registration area. ?  ?Wear comfortable clothing and clothing appropriate for easy access to any Portacath or PICC line.  ? ?We strive to give you quality time with your provider. You may need to reschedule your appointment if you arrive late (15 or more minutes).  Arriving late affects you and other patients whose appointments are after yours.  Also, if you miss three or more appointments without notifying the office, you may be dismissed from the clinic at the provider?s discretion.    ?  ?For prescription refill requests, have your pharmacy contact our office and allow 72 hours for refills to be completed.   ? ?Today you received the following chemotherapy and/or immunotherapy agents: Trastuzumab    ?  ?To help prevent nausea and vomiting after your treatment, we encourage you to take your nausea medication as directed. ? ?BELOW ARE SYMPTOMS THAT SHOULD BE REPORTED IMMEDIATELY: ?*FEVER GREATER THAN 100.4 F (38 ?C) OR HIGHER ?*CHILLS OR SWEATING ?*NAUSEA AND VOMITING THAT IS NOT CONTROLLED WITH YOUR NAUSEA MEDICATION ?*UNUSUAL SHORTNESS OF BREATH ?*UNUSUAL BRUISING OR BLEEDING ?*URINARY PROBLEMS (pain or burning when urinating, or frequent urination) ?*BOWEL PROBLEMS (unusual diarrhea, constipation, pain near the anus) ?TENDERNESS IN MOUTH AND THROAT WITH OR WITHOUT PRESENCE OF ULCERS (sore throat, sores in mouth, or a toothache) ?UNUSUAL RASH, SWELLING OR PAIN  ?UNUSUAL VAGINAL DISCHARGE OR ITCHING  ? ?Items with * indicate a potential emergency and should be followed up as soon as possible or go to the Emergency Department if any problems should occur. ? ?Please show the CHEMOTHERAPY ALERT CARD or IMMUNOTHERAPY ALERT CARD at check-in  to the Emergency Department and triage nurse. ? ?Should you have questions after your visit or need to cancel or reschedule your appointment, please contact [facility]  Dept: [phone]  and follow the prompts.  Office hours are 8:00 a.m. to 4:30 p.m. Monday - Friday. Please note that voicemails left after 4:00 p.m. may not be returned until the following business day.  We are closed weekends and major holidays. You have access to a nurse at all times for urgent questions. Please call the main number to the clinic Dept: [phone] and follow the prompts. ? ? ?For any non-urgent questions, you may also contact your provider using MyChart. We now offer e-Visits for anyone 18 and older to request care online for non-urgent symptoms. For details visit [email or web address]. ?  ?Also download the MyChart app! Go to the app store, search "MyChart", open the app, select [place], and log in with your MyChart username and password. ? ?Due to Covid, a mask is required upon entering the hospital/clinic. If you do not have a mask, one will be given to you upon arrival. For doctor visits, patients may have 1 support person aged 18 or older with them. For treatment visits, patients cannot have anyone with them due to current Covid guidelines and our immunocompromised population.  ? ?

## 2021-05-15 ENCOUNTER — Encounter: Payer: Self-pay | Admitting: Hematology and Oncology

## 2021-05-15 ENCOUNTER — Inpatient Hospital Stay: Payer: Medicare HMO

## 2021-05-15 ENCOUNTER — Other Ambulatory Visit: Payer: Self-pay

## 2021-05-15 ENCOUNTER — Inpatient Hospital Stay (HOSPITAL_BASED_OUTPATIENT_CLINIC_OR_DEPARTMENT_OTHER): Payer: Medicare HMO | Admitting: Hematology and Oncology

## 2021-05-15 ENCOUNTER — Inpatient Hospital Stay: Payer: Medicare HMO | Attending: Gynecologic Oncology

## 2021-05-15 VITALS — BP 162/64 | HR 75 | Temp 97.5°F | Resp 18 | Ht 62.0 in | Wt 129.2 lb

## 2021-05-15 DIAGNOSIS — Z5112 Encounter for antineoplastic immunotherapy: Secondary | ICD-10-CM | POA: Diagnosis not present

## 2021-05-15 DIAGNOSIS — N183 Chronic kidney disease, stage 3 unspecified: Secondary | ICD-10-CM

## 2021-05-15 DIAGNOSIS — Z9079 Acquired absence of other genital organ(s): Secondary | ICD-10-CM | POA: Insufficient documentation

## 2021-05-15 DIAGNOSIS — C541 Malignant neoplasm of endometrium: Secondary | ICD-10-CM

## 2021-05-15 DIAGNOSIS — C779 Secondary and unspecified malignant neoplasm of lymph node, unspecified: Secondary | ICD-10-CM | POA: Diagnosis present

## 2021-05-15 DIAGNOSIS — Z5111 Encounter for antineoplastic chemotherapy: Secondary | ICD-10-CM | POA: Diagnosis not present

## 2021-05-15 DIAGNOSIS — Z90722 Acquired absence of ovaries, bilateral: Secondary | ICD-10-CM | POA: Diagnosis not present

## 2021-05-15 DIAGNOSIS — D539 Nutritional anemia, unspecified: Secondary | ICD-10-CM | POA: Diagnosis not present

## 2021-05-15 DIAGNOSIS — Z79899 Other long term (current) drug therapy: Secondary | ICD-10-CM | POA: Insufficient documentation

## 2021-05-15 DIAGNOSIS — Z90710 Acquired absence of both cervix and uterus: Secondary | ICD-10-CM | POA: Diagnosis not present

## 2021-05-15 DIAGNOSIS — Z7982 Long term (current) use of aspirin: Secondary | ICD-10-CM | POA: Insufficient documentation

## 2021-05-15 DIAGNOSIS — D631 Anemia in chronic kidney disease: Secondary | ICD-10-CM | POA: Insufficient documentation

## 2021-05-15 LAB — COMPREHENSIVE METABOLIC PANEL
ALT: 12 U/L (ref 0–44)
AST: 18 U/L (ref 15–41)
Albumin: 4 g/dL (ref 3.5–5.0)
Alkaline Phosphatase: 71 U/L (ref 38–126)
Anion gap: 10 (ref 5–15)
BUN: 22 mg/dL (ref 8–23)
CO2: 27 mmol/L (ref 22–32)
Calcium: 9.7 mg/dL (ref 8.9–10.3)
Chloride: 101 mmol/L (ref 98–111)
Creatinine, Ser: 1.23 mg/dL — ABNORMAL HIGH (ref 0.44–1.00)
GFR, Estimated: 44 mL/min — ABNORMAL LOW (ref >60)
Glucose, Bld: 98 mg/dL (ref 70–99)
Potassium: 3.6 mmol/L (ref 3.5–5.1)
Sodium: 138 mmol/L (ref 135–145)
Total Bilirubin: 0.5 mg/dL (ref 0.3–1.2)
Total Protein: 7.1 g/dL (ref 6.5–8.1)

## 2021-05-15 LAB — CBC WITH DIFFERENTIAL/PLATELET
Abs Immature Granulocytes: 0.04 10*3/uL (ref 0.00–0.07)
Basophils Absolute: 0.1 10*3/uL (ref 0.0–0.1)
Basophils Relative: 1 %
Eosinophils Absolute: 0.2 10*3/uL (ref 0.0–0.5)
Eosinophils Relative: 2 %
HCT: 31.5 % — ABNORMAL LOW (ref 36.0–46.0)
Hemoglobin: 11.1 g/dL — ABNORMAL LOW (ref 12.0–15.0)
Immature Granulocytes: 0 %
Lymphocytes Relative: 11 %
Lymphs Abs: 1.2 10*3/uL (ref 0.7–4.0)
MCH: 31.3 pg (ref 26.0–34.0)
MCHC: 35.2 g/dL (ref 30.0–36.0)
MCV: 88.7 fL (ref 80.0–100.0)
Monocytes Absolute: 0.7 10*3/uL (ref 0.1–1.0)
Monocytes Relative: 6 %
Neutro Abs: 9.1 10*3/uL — ABNORMAL HIGH (ref 1.7–7.7)
Neutrophils Relative %: 80 %
Platelets: 237 10*3/uL (ref 150–400)
RBC: 3.55 MIL/uL — ABNORMAL LOW (ref 3.87–5.11)
RDW: 11.8 % (ref 11.5–15.5)
WBC: 11.4 10*3/uL — ABNORMAL HIGH (ref 4.0–10.5)
nRBC: 0 % (ref 0.0–0.2)

## 2021-05-15 MED ORDER — SODIUM CHLORIDE 0.9 % IV SOLN: Freq: Once | INTRAVENOUS | Status: AC

## 2021-05-15 MED ORDER — SODIUM CHLORIDE 0.9% FLUSH
10.0000 mL | INTRAVENOUS | Status: DC | PRN
  Administered 2021-05-15: 10 mL

## 2021-05-15 MED ORDER — DIPHENHYDRAMINE HCL 25 MG PO CAPS
50.0000 mg | ORAL_CAPSULE | Freq: Once | ORAL | Status: AC
  Administered 2021-05-15: 50 mg via ORAL
  Filled 2021-05-15: qty 2

## 2021-05-15 MED ORDER — HEPARIN SOD (PORK) LOCK FLUSH 100 UNIT/ML IV SOLN
500.0000 [IU] | Freq: Once | INTRAVENOUS | Status: AC | PRN
  Administered 2021-05-15: 500 [IU]

## 2021-05-15 MED ORDER — TRASTUZUMAB-ANNS CHEMO 150 MG IV SOLR
6.0000 mg/kg | Freq: Once | INTRAVENOUS | Status: AC
  Administered 2021-05-15: 378 mg via INTRAVENOUS
  Filled 2021-05-15: qty 18

## 2021-05-15 MED ORDER — SODIUM CHLORIDE 0.9% FLUSH
10.0000 mL | Freq: Once | INTRAVENOUS | Status: AC
  Administered 2021-05-15: 10 mL

## 2021-05-15 MED ORDER — ACETAMINOPHEN 325 MG PO TABS
650.0000 mg | ORAL_TABLET | Freq: Once | ORAL | Status: AC
  Administered 2021-05-15: 650 mg via ORAL
  Filled 2021-05-15: qty 2

## 2021-05-15 NOTE — Assessment & Plan Note (Signed)
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 

## 2021-05-15 NOTE — Progress Notes (Signed)
V[facility] Cerv[facility] OFFICE PROGRESS NOTE  Patient Care Team: Burnard Bunting, [name]  ASSESSMENT & PLAN:  Endometrial cancer [facility]) She tolerated single agent trastuzumab well She has no signs of cancer recurrence I have reviewed CT imaging with the patient which is stable Her next CT imaging will be in 6 months, March 2023 I plan to continue echocardiogram monitoring every 3 months, next due end of November/early Dec  CKD (chronic kidney disease), stage III ([place]) Renal function is stable. Monitor closely She was seen by nephrologist recently  Deficiency anemia She has multifactorial anemia, anemia secondary to prior chemotherapy as well as chronic kidney disease Anemia is improving/stable Recent vitamin B12 level was adequate She is not symptomatic Observe only  Orders Placed This Encounter  Procedures   ECHOCARDIOGRAM COMPLETE    Standing Status:   Future    Standing Expiration Date:   05/15/2022    Order Specific Question:   Where should this test be performed    Answer:   [place]    Order Specific Question:   Perflutren DEFINITY (image enhancing agent) should be administered unless hypersensitivity or allergy exist    Answer:   Administer Perflutren    Order Specific Question:   Reason for exam-Echo    Answer:   Chemo  [id number]    All questions were answered. The patient knows to call the clinic with any problems, questions or concerns. The total time spent in the appointment was 25 minutes encounter with patients including review of chart and various tests results, discussions about plan of care and coordination of care plan   Vicki Lark, MD 05/15/2021 3:56 PM  INTERVAL HISTORY: Please see below for problem oriented charting. she returns for treatment follow-up on trastuzumab for HER2 positive metastatic uterine cancer She is doing very well No side effects from treatment so far Even though her blood pressure is elevated here  but blood pressure at home is usually within normal range She started to exercise more recently  REVIEW OF SYSTEMS:   Constitutional: Denies fevers, chills or abnormal weight loss Eyes: Denies blurriness of vision Ears, nose, mouth, throat, and face: Denies mucositis or sore throat Respiratory: Denies cough, dyspnea or wheezes Cardiovascular: Denies palpitation, chest discomfort or lower extremity swelling Gastrointestinal:  Denies nausea, heartburn or change in bowel habits Skin: Denies abnormal skin rashes Lymphatics: Denies new lymphadenopathy or easy bruising Neurological:Denies numbness, tingling or new weaknesses Behavioral/Psych: Mood is stable, no new changes  All other systems were reviewed with the patient and are negative.  I have reviewed the past medical history, past surgical history, social history and family history with the patient and they are unchanged from previous note.  ALLERGIES:  has No Known Allergies.  MEDICATIONS:  Current Outpatient Medications  Medication Sig Dispense Refill   acetaminophen (TYLENOL) 500 MG tablet Take 1,000 mg by mouth every 6 (six) hours as needed.     aspirin EC 81 MG tablet Take 81 mg by mouth daily.     atorvastatin (LIPITOR) 20 MG tablet Take 20 mg by mouth every evening.      Calcium Carbonate-Vitamin D (CALCIUM-VITAMIN D) 500-200 MG-UNIT per tablet Take 1 tablet by mouth daily.     citalopram (CELEXA) 20 MG tablet Take 20 mg by mouth at bedtime.      estradiol (ESTRACE) 0.1 MG/GM vaginal cream PLACE 1 APPLICATORFUL VAGINALLY 3 TIMES A WEEK 42.5 g 12   gabapentin (NEURONTIN) 600 MG tablet Take  600 mg by mouth at bedtime as needed.     levothyroxine (SYNTHROID, LEVOTHROID) 100 MCG tablet Take 100 mcg by mouth daily before breakfast.      lidocaine-prilocaine (EMLA) cream Apply to affected area once 30 g 3   losartan-hydrochlorothiazide (HYZAAR) 100-25 MG tablet      magnesium oxide (MAG-OX) 400 (241.3 Mg) MG tablet Take 1 tablet  (400 mg total) by mouth daily. 30 tablet 11   metoprolol succinate (TOPROL-XL) 25 MG 24 hr tablet Take 25 mg by mouth daily.     Multiple Vitamin (MULTIVITAMIN WITH MINERALS) TABS Take 1 tablet by mouth daily.     ondansetron (ZOFRAN) 8 MG tablet Take 1 tablet (8 mg total) by mouth every 8 (eight) hours as needed. Start on the third day after chemotherapy. 30 tablet 1   valACYclovir (VALTREX) 1000 MG tablet valacyclovir 1 gram tablet     No current facility-administered medications for this visit.   Facility-Administered Medications Ordered in Other Visits  Medication Dose Route Frequency Provider Last Rate Last Admin   sodium chloride flush (NS) 0.9 % injection 10 mL  10 mL Intracatheter PRN Alvy Bimler, Vicki Riegler, MD   10 mL at 05/15/21 1506    SUMMARY OF ONCOLOGIC HISTORY: Oncology History Overview Note  Vicki Cervantes  has a remote history of left  breast cancer at age 76 but received BRCA testing approximately 5 years ago which was negative. Her cancer was treated with surgery but no radiation or chemotherapy.   Serous endometrial cancer, MSI stable ER positive, PR neg, Her2/neu 3+      Endometrial cancer ([place])  08/29/2016 Pathology Results   Endometrium, biopsy - HIGH GRADE ENDOMETRIAL CARCINOMA, SEE COMMENT. Microscopic Comment The sections show multiple fragments of adenocarcinoma displaying glandular and papillary patterns associated with high grade cytomorphology characterized by nuclear pleomorphism, prominent nucleoli and brisk mitosis. Immunohistochemical stains show that the tumor cells are positive for vimentin, p16, p53 with increased Ki-67 expression. Estrogen and progesterone receptor stains show patchy weak positivity. No significant positivity is seen with CEA. The findings are consistent with high grade endometrial carcinoma and the overall morphology and phenotypic features favor serous carcinoma.   08/29/2016 Initial Diagnosis   She presented with postmenopausal bleeding    10/03/2016 Imaging   CT C/A/P 09/2016 IMPRESSION: 1. Thickening of the endometrial canal up to 19 mm in fundus, presumably corresponding to the patient's reported endometrial carcinoma. 2. Multiple tiny pulmonary nodules scattered throughout the lungs bilaterally measuring 4 mm or less in size. Nodules of this size are typically considered statistically likely benign. In the setting of known primary malignancy, metastatic disease to the lungs is not excluded, but is not strongly favored on today's examination. Attention on followup studies is recommended to ensure the stability or resolution of these nodules. 3. Subcentimeter low-attenuation lesion in the central aspect of segment 8 of the liver is too small to characterize. This is statistically likely a tiny cyst, but warrants attention on follow-up studies to exclude the possibility of a solitary hepatic metastasis. 4. 1.5 x 1.5 x 1.7 cm well-circumscribed lesion in the proximal stomach. This is of uncertain etiology and significance, and could represent a benign gastric polyp, however, further evaluation with nonemergent endoscopy is suggested in the near future for further evaluation. 5. **An incidental finding of potential clinical significance has been found. 1.1 x 1.6 cm thyroid nodule in the inferior aspect of the right lobe of the thyroid gland. Follow-up evaluation with nonemergent thyroid ultrasound is recommended  in the near future to better evaluate this finding. This recommendation follows ACR consensuss guidelines: Managing Incidental Thyroid Nodules Detected on Imaging: White Paper of the ACR Incidental Thyroid Findings Committee. J Am Coll Radiol 2015;12(2):143-150.** 6. Aortic atherosclerosis, in addition to left anterior descending coronary artery disease   10/22/2016 Surgery   Robotic assisted total hysterectomy, BSO and bilateral pelvic lymphadenectomy  Final pathology revealed a 3cm polyp containing serous carcinoma but with no  myometrial invasion, no LVSI and negative nodes.  Stage IA Uterine serous cancer   10/22/2016 Pathology Results   1. Lymph nodes, regional resection, right pelvic - SIX BENIGN LYMPH NODES (0/6). 2. Lymph nodes, regional resection, left pelvic - SEVEN BENIGN LYMPH NODES (0/7). 3. Uterus +/- tubes/ovaries, neoplastic, with right ovary and fallopian tube ENDOMYOMETRIUM - SEROUS CARCINOMA ARISING WITHIN AN ENDOMETRIAL POLYP - NO MYOMETRIAL INVASION IDENTIFIED - ADENOMYOSIS - LEIOMYOMA (1 CM) - SEE ONCOLOGY TABLE AND COMMENT CERVIX - CARCINOMA FOCALLY INVOLVES ENDOCERVICAL GLANDS - NABOTHIAN CYSTS RIGHT ADNEXA - BENIGN OVARY AND FALLOPIAN TUBE - NO CARCINOMA IDENTIFIED 4. Cul-de-sac biopsy - MESOTHELIAL HYPERPLASIA Microscopic Comment 3. ONCOLOGY TABLE-UTERUS, CARCINOMA OR CARCINOSARCOMA Specimen: Uterus, right fallopian tube and ovary Procedure: Total hysterectomy and right salpingo-oophorectomy Lymph node sampling performed: Bilateral pelvic regional resection Specimen integrity: Intact Maximum tumor size: 3 cm (polyp) Histologic type: Serous carcinoma Grade: High grade Myometrial invasion: Not identified Cervical stromal involvement: No, focal endocervical gland involvement Extent of involvement of other organs: Not identified Lymph - vascular invasion: Not identified Peritoneal washings: N/A Lymph nodes: Examined: 0 Sentinel 13 Non-sentinel 13 Total Lymph nodes with metastasis: 0 Isolated tumor cells (< 0.2 mm): 0 Micrometastasis: (> 0.2 mm and < 2.0 mm): 0 Macrometastasis: (> 2.0 mm): 0 Extracapsular extension: N/A Pelvic lymph nodes: 0 involved of 13 lymph nodes. Para-aortic lymph nodes: No para-aortic nodes submitted TNM code: pT1a, pNX FIGO Stage (based on pathologic findings, needs clinical correlation): IA Comment: Immunohistochemistry for cytokeratin AE1/AE3 is performed on all of the lymph nodes (parts 1 & 2) and no metastatic carcinoma is identified.    07/04/2017 Genetic Testing   The patient had genetic testing due to a personal history of breast and uterine cancer, and a family history of stomach cancer.  The Multi-Cancer Panel was ordered. The Multi-Cancer Panel offered by [facility] includes sequencing and/or deletion duplication testing of the following 83 genes: ALK, APC, ATM, AXIN2,BAP1,  BARD1, BLM, BMPR1A, BRCA1, BRCA2, BRIP1, CASR, CDC73, CDH1, CDK4, CDKN1B, CDKN1C, CDKN2A (p14ARF), CDKN2A (p16INK4a), CEBPA, CHEK2, CTNNA1, DICER1, DIS3L2, EGFR (c.2369C>T, p.Thr790Met variant only), EPCAM (Deletion/duplication testing only), FH, FLCN, GATA2, GPC3, GREM1 (Promoter region deletion/duplication testing only), HOXB13 (c.251G>A, p.Gly84Glu), HRAS, KIT, MAX, MEN1, MET, MITF (c.952G>A, p.Glu318Lys variant only), MLH1, MSH2, MSH3, MSH6, MUTYH, NBN, NF1, NF2, NTHL1, PALB2, PDGFRA, PHOX2B, PMS2, POLD1, POLE, POT1, PRKAR1A, PTCH1, PTEN, RAD50, RAD51C, RAD51D, RB1, RECQL4, RET, RUNX1, SDHAF2, SDHA (sequence changes only), SDHB, SDHC, SDHD, SMAD4, SMARCA4, SMARCB1, SMARCE1, STK11, SUFU, TERC, TERT, TMEM127, TP53, TSC1, TSC2, VHL, WRN and WT1.   Results: No pathogenic variants were identified.  A variant of uncertain significance in the gene APC was identified.  c.791A>G (p.Gln264Arg).  The date of this test report is 07/04/2017.     Genetic Testing   Patient has genetic testing done for MSI. 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    12/03/2017 Imaging   CT Chest/Abd/Pelvis to follow pulmonary nodule and gastric mass IMPRESSION: 1. Stable CT of the chest. Small pulmonary nodules are unchanged  when compared with previous exam. 2. No new findings identified. 3. Subcentimeter low-attenuation lesions within the liver are remain too small to characterize but are stable from prior exam. 4. Persistent indeterminate low-attenuation structure within the proximal stomach is unchanged measuring 1.4 cm. Correlation with direct  visualization is advised   06/30/2018 Imaging   CT CHEST Lungs/Pleura: Stable scattered sub-cm pulmonary nodules are again seen bilaterally and are stable compared to previous studies. No new or enlarging pulmonary nodules or masses identified. No evidence of pulmonary infiltrate or pleural effusion.   08/19/2018 Echocardiogram   ECHO is done in [place]: EF 60-65%. Mild impaired relaxation. (report scanned)   09/17/2018 Relapse/Recurrence   Presented with c/o vagina discharge.  Lesion noted at the left vaginal apex 28mm lesion removed Path c/w high grade serous cancer   09/17/2018 Pathology Results   Vagina, biopsy, left apex - HIGH GRADE SEROUS CARCINOMA.   09/28/2018 PET scan   1. Two hypermetabolic axial lymph nodes. Unusual site for metastatic endometrial carcinoma however the activity is more intense than typically seen in reactive adenopathy. Suggest ultrasound-guided percutaneous biopsy of the larger RIGHT axial lymph node 2. No evidence of local recurrence at the vaginal cuff.  3. No metastatic adenopathy in the abdomen or pelvis. 4. Stable small pulmonary nodules   10/09/2018 Pathology Results   Lymph node, needle/core biopsy, right axilla - METASTATIC CARCINOMA, SEE COMMENT. Microscopic Comment The carcinoma appears high grade. Immunohistochemistry is positive for cytokeratin 7, PAX-8, and ER. Cytokeratin 20, CDX-2, PR, and GATA-3 are negative. The findings along with the history are consistent with a gynecologic primary.    10/09/2018 Procedure   Ultrasound-guided core biopsies of a right axillary lymph node.   10/14/2018 Cancer Staging   Staging form: Corpus Uteri - Carcinoma and Carcinosarcoma, AJCC 8th Edition - Clinical: Stage IVB (cT1, cN0, pM1) - Signed by Vicki Lark, MD on 10/14/2018    10/19/2018 Imaging   Placement of single lumen port a cath via right internal jugular vein. The catheter tip lies at the cavo-atrial junction. A power injectable port a cath was placed and is  ready for immediate use.    10/21/2018 -  Chemotherapy   The patient had carboplatin and taxol for treatment   11/11/2018 -  Chemotherapy   Patient is on Treatment Plan : BREAST Trastuzumab q21d     12/21/2018 PET scan   1. Interval resolution of hypermetabolic right axillary lymph nodes. No metabolic findings highly suspicious for recurrent metastatic disease. 2. New mild hypermetabolism within a borderline prominent portacaval lymph node, nonspecific. While a reactive node is favored, a lymph node metastasis cannot be entirely excluded. Suggest attention to this lymph node on follow-up PET-CT in 3-6 months. 3. Nonspecific new hypermetabolism at the ileocecal valve, more likely physiologic given absence of CT correlate. 4. Scattered subcentimeter pulmonary nodules are all stable and below PET resolution, more likely benign, continued CT surveillance advised. 5. Chronic findings include: Aortic Atherosclerosis (ICD10-I70.0). Marked diffuse colonic diverticulosis. Coronary atherosclerosis.     12/28/2018 Echocardiogram   1. The left ventricle has normal systolic function with an ejection fraction of 60-65%. The cavity size was normal. Left ventricular diastolic Doppler parameters are consistent with impaired relaxation.  2. GLS recorded as -10.7 but LV appears hyperdynamic and tracking of endocardium appears poor.  3. The right ventricle has normal systolic function. The cavity was normal. There is no increase in right ventricular wall thickness.  4. Mild thickening of the mitral valve leaflet.  5. The  aortic valve was not well visualized. Mild thickening of the aortic valve. Aortic valve regurgitation is trivial by color flow Doppler.   03/23/2019 Imaging   PET 1. No findings of hypermetabolic residual/recurrent or metastatic disease. 2. Similar low-level hypermetabolism within normal sized portocaval and right inguinal nodes, favored to be reactive. 3. Ongoing stability of small bilateral  pulmonary nodules, favored to be benign. Below PET resolution. 4. Coronary artery atherosclerosis. Aortic Atherosclerosis   03/26/2019 Echocardiogram   1. The left ventricle has normal systolic function, with an ejection fraction of 55-60%. The cavity size was normal. Left ventricular diastolic Doppler parameters are consistent with impaired relaxation.  2. The right ventricle has normal systolic function. The cavity was normal.  3. The mitral valve is abnormal. Mild thickening of the mitral valve leaflet. There is mild mitral annular calcification present.  4. The tricuspid valve is grossly normal.  5. The aortic valve is tricuspid. Mild calcification of the aortic valve. No stenosis of the aortic valve.  6. The aorta is normal unless otherwise noted.  7. Normal LV systolic function; grade 1 diastolic dysfunction; FGB-02.1%.   06/30/2019 Imaging   Chest Impression:   1. No evidence thoracic metastasis. 2. Stable small benign-appearing pulmonary nodules   Abdomen / Pelvis Impression:   1. No evidence of metastatic disease in the abdomen pelvis. 2.  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 3.  Aortic Atherosclerosis (ICD10-I70.0).   06/30/2019 Echocardiogram    1. Left ventricular ejection fraction, by visual estimation, is 60 to 65%. The left ventricle has normal function. There is no left ventricular hypertrophy.  2. Left ventricular diastolic parameters are consistent with Grade I diastolic dysfunction (impaired relaxation).  3. The left ventricle has no regional wall motion abnormalities.  4. Global right ventricle has normal systolic function.The right ventricular size is normal. No increase in right ventricular wall thickness.  5. Left atrial size was mild-moderately dilated.  6. Right atrial size was normal.  7. The mitral valve is normal in structure. Trivial mitral valve regurgitation.  8. The tricuspid valve is normal in structure. Tricuspid valve regurgitation  is trivial.  9. The aortic valve is normal in structure. Aortic valve regurgitation is trivial. No evidence of aortic valve sclerosis or stenosis. 10. The pulmonic valve was grossly normal. Pulmonic valve regurgitation is not visualized. 11. Mildly elevated pulmonary artery systolic pressure. 12. The inferior vena cava is normal in size with greater than 50% respiratory variability, suggesting right atrial pressure of 3 mmHg. 13. The average left ventricular global longitudinal strain is -12.5 %. GLS underestimated due to poor endocardial tracking.     09/27/2019 Imaging   1. Multiple small, nonspecific pulmonary nodules are again noted. The previously noted lung nodules are unchanged in size from previous exam. There is a new lung nodule within the medial right lower lobe which is nonspecific measure 4 mm. Attention at follow-up imaging is recommended. 2. No evidence of metastatic disease within the abdomen or pelvis. 3. Coronary artery calcifications.  The 4.  Aortic Atherosclerosis (ICD10-I70.0).   09/30/2019 Echocardiogram    1. Left ventricular ejection fraction, by estimation, is 60 to 65%. The left ventricle has normal function. The left ventricle has no regional wall motion abnormalities. Left ventricular diastolic parameters are consistent with Grade I diastolic dysfunction (impaired relaxation).  2. Right ventricular systolic function is normal. The right ventricular size is normal. There is normal pulmonary artery systolic pressure. The estimated right ventricular systolic pressure is 11.5 mmHg.  3.  The mitral valve is normal in structure. No evidence of mitral valve regurgitation. No evidence of mitral stenosis.  4. The aortic valve is normal in structure. Aortic valve regurgitation is trivial. No aortic stenosis is present.  5. The inferior vena cava is normal in size with greater than 50% respiratory variability, suggesting right atrial pressure of 3 mmHg.     01/07/2020  Echocardiogram    1. Normal LV function; grade 1 diastolic dysfunction; mild AI; GLS -21%.  2. Left ventricular ejection fraction, by estimation, is 55 to 60%. The left ventricle has normal function. The left ventricle has no regional wall motion abnormalities. Left ventricular diastolic parameters are consistent with Grade I diastolic dysfunction (impaired relaxation).  3. Right ventricular systolic function is normal. The right ventricular size is normal.  4. The mitral valve is normal in structure. Trivial mitral valve regurgitation. No evidence of mitral stenosis.  5. The aortic valve is tricuspid. Aortic valve regurgitation is mild. Mild aortic valve sclerosis is present, with no evidence of aortic valve stenosis.  6. The inferior vena cava is normal in size with greater than 50% respiratory variability, suggesting right atrial pressure of 3 mmHg.     03/27/2020 Imaging   1. Status post hysterectomy. No evidence of recurrent or metastatic disease in the abdomen or pelvis. 2. Multiple small pulmonary nodules in the lung bases are unchanged to the extent that they are included on current examination and remain most likely sequelae of prior infection or inflammation. Attention on follow-up. 3. Aortic Atherosclerosis (ICD10-I70.0).   04/17/2020 Echocardiogram   1. Normal LVEF, normal and unchanged GLS: -20.2%.  2. Left ventricular ejection fraction, by estimation, is 60 to 65%. The left ventricle has normal function. The left ventricle has no regional wall motion abnormalities. There is mild concentric left ventricular hypertrophy. Left ventricular diastolic parameters are consistent with Grade I diastolic dysfunction (impaired relaxation). The average left ventricular global longitudinal strain is -20.2 %. The global longitudinal strain is normal.  3. Right ventricular systolic function is normal. The right ventricular size is normal.  4. The mitral valve is normal in structure. Mild mitral valve  regurgitation. No evidence of mitral stenosis.  5. The aortic valve is normal in structure. There is mild calcification of the aortic valve. There is mild thickening of the aortic valve. Aortic valve regurgitation is mild. No aortic stenosis is present.  6. The inferior vena cava is normal in size with greater than 50% respiratory variability, suggesting right atrial pressure of 3 mmHg.   08/02/2020 Echocardiogram   1. Left ventricular ejection fraction, by estimation, is 55 to 60%. The left ventricle has normal function. The left ventricle has no regional wall motion abnormalities. Left ventricular diastolic parameters are consistent with Grade I diastolic dysfunction (impaired relaxation). The average left ventricular global longitudinal strain is -27.0 %. The global longitudinal strain is normal.  2. Right ventricular systolic function is normal. The right ventricular size is normal. Tricuspid regurgitation signal is inadequate for assessing PA pressure.  3. The mitral valve is grossly normal. Trivial mitral valve regurgitation. No evidence of mitral stenosis.  4. The aortic valve is tricuspid. Aortic valve regurgitation is mild. No aortic stenosis is present.  5. The inferior vena cava is normal in size with greater than 50% respiratory variability, suggesting right atrial pressure of 3 mmHg.   09/19/2020 Imaging   1. Status post hysterectomy. No evidence of recurrent or new metastatic disease within the chest, abdomen, or pelvis. 2. Scattered bilateral  tiny pulmonary nodules, unchanged from prior studies and most likely sequela of prior infection or inflammation, recommend attention on follow-up imaging. No new suspicious pulmonary nodules or masses. 3. Subcutaneous edema and small hematoma overlying the posterior gluteal musculature, recommend correlation with recent history of trauma. 4. Mild low-density wall thickening of the gastric antrum, which may represent gastritis. 5. Hepatic  steatosis. 6. Extensive sigmoid colonic diverticulosis without findings of acute diverticulitis. 7. Aortic atherosclerosis.  Aortic Atherosclerosis (ICD10-I70.0).   11/20/2020 Echocardiogram    1. Compared to echo from Jan 2022, Global longitudinal strain is less negative. (Previously -27%). Left ventricular ejection fraction, by estimation, is 60 to 65%. The left ventricle has normal function. The left ventricle has no regional wall motion abnormalities. There is mild left ventricular hypertrophy. Left ventricular diastolic parameters are indeterminate.  2. Right ventricular systolic function is normal. The right ventricular size is normal.  3. The mitral valve is normal in structure. Trivial mitral valve regurgitation.  4. Aortic valve regurgitation is mild. Mild aortic valve sclerosis is present, with no evidence of aortic valve stenosis.     03/12/2021 Echocardiogram   1. Left ventricular ejection fraction, by estimation, is 60 to 65%. Left ventricular ejection fraction by 2D MOD biplane is 63.6 %. The left ventricle has normal function. The left ventricle has no regional wall motion abnormalities. There is mild left ventricular hypertrophy. Left ventricular diastolic parameters are consistent with Grade I diastolic dysfunction (impaired relaxation). The average left ventricular global longitudinal strain is -20.0 %. The global longitudinal strain is normal.  2. Right ventricular systolic function is normal. The right ventricular size is normal. There is normal pulmonary artery systolic pressure. The estimated right ventricular systolic pressure is 81.1 mmHg.  3. The mitral valve is normal in structure. No evidence of mitral valve regurgitation. No evidence of mitral stenosis.  4. The aortic valve is tricuspid. Aortic valve regurgitation is trivial. No aortic stenosis is present. Aortic regurgitation PHT measures 403 msec.  5. The inferior vena cava is normal in size with greater than 50%  respiratory variability, suggesting right atrial pressure of 3 mmHg.   04/02/2021 Imaging   Stable small pulmonary nodules scattered throughout the chest, unchanged, without new or suspicious pulmonary nodule.   Three-vessel coronary artery disease.   Aortic Atherosclerosis (ICD10-I70.0).   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   Metastasis to lymph nodes ([place])   10/21/2018 - 02/24/2019 Chemotherapy   The patient had palonosetron (ALOXI) injection 0.25 mg, 0.25 mg, Intravenous,  Once, 7 of 7 cycles Administration: 0.25 mg (10/21/2018), 0.25 mg (11/11/2018), 0.25 mg (12/02/2018), 0.25 mg (12/23/2018), 0.25 mg (01/13/2019), 0.25 mg (02/03/2019), 0.25 mg (02/24/2019) CARBOplatin (PARAPLATIN) 310 mg in sodium chloride 0.9 % 250 mL chemo infusion, 310 mg, Intravenous,  Once, 7 of 7 cycles Dose modification: 300 mg (original dose 311.5 mg, Cycle 4, Reason: Dose not tolerated) Administration: 310 mg (10/21/2018), 300 mg (11/11/2018), 300 mg (12/02/2018), 300 mg (12/23/2018), 300 mg (01/13/2019), 300 mg (02/03/2019), 300 mg (02/24/2019) PACLitaxel (TAXOL) 222 mg in sodium chloride 0.9 % 250 mL chemo infusion (> $RemoveBef'80mg'aCWzdxxEqe$ /m2), 135 mg/m2 = 222 mg, Intravenous,  Once, 7 of 7 cycles Administration: 222 mg (10/21/2018), 222 mg (11/11/2018), 222 mg (12/02/2018), 222 mg (12/23/2018), 222 mg (01/13/2019), 222 mg (02/03/2019), 222 mg (02/24/2019) fosaprepitant (EMEND) 150 mg, dexamethasone (DECADRON) 12 mg in sodium chloride 0.9 % 145 mL IVPB, , Intravenous,  Once, 7 of 7 cycles Administration:  (10/21/2018),  (11/11/2018),  (12/02/2018),  (12/23/2018),  (  01/13/2019),  (02/03/2019),  (02/24/2019)   for chemotherapy treatment.       PHYSICAL EXAMINATION: ECOG PERFORMANCE STATUS: 1 - Symptomatic but completely ambulatory  Vitals:   05/15/21 1212  BP: (!) 162/64  Pulse: 75  Resp: 18  Temp: (!) 97.5 F (36.4 C)  SpO2: 99%   Filed Weights   05/15/21 1212  Weight: 129 lb 3.2 oz (58.6 kg)    GENERAL:alert, no  distress and comfortable SKIN: skin color, texture, turgor are normal, no rashes or significant lesions EYES: normal, Conjunctiva are pink and non-injected, sclera clear OROPHARYNX:no exudate, no erythema and lips, buccal mucosa, and tongue normal  NECK: supple, thyroid normal size, non-tender, without nodularity LYMPH:  no palpable lymphadenopathy in the cervical, axillary or inguinal LUNGS: clear to auscultation and percussion with normal breathing effort HEART: regular rate & rhythm and no murmurs and no lower extremity edema ABDOMEN:abdomen soft, non-tender and normal bowel sounds Musculoskeletal:no cyanosis of digits and no clubbing  NEURO: alert & oriented x 3 with fluent speech, no focal motor/sensory deficits  LABORATORY DATA:  I have reviewed the data as listed    Component Value Date/Time   NA 138 05/15/2021 1206   NA 139 02/13/2017 1512   K 3.6 05/15/2021 1206   K 4.2 02/13/2017 1512   CL 101 05/15/2021 1206   CO2 27 05/15/2021 1206   CO2 28 02/13/2017 1512   GLUCOSE 98 05/15/2021 1206   GLUCOSE 118 02/13/2017 1512   BUN 22 05/15/2021 1206   BUN 25.2 02/13/2017 1512   CREATININE 1.23 (H) 05/15/2021 1206   CREATININE 1.37 (H) 12/21/2019 0850   CREATININE 1.2 (H) 02/13/2017 1512   CALCIUM 9.7 05/15/2021 1206   CALCIUM 10.0 02/13/2017 1512   PROT 7.1 05/15/2021 1206   ALBUMIN 4.0 05/15/2021 1206   AST 18 05/15/2021 1206   AST 19 12/21/2019 0850   ALT 12 05/15/2021 1206   ALT 13 12/21/2019 0850   ALKPHOS 71 05/15/2021 1206   BILITOT 0.5 05/15/2021 1206   BILITOT 0.4 12/21/2019 0850   GFRNONAA 44 (L) 05/15/2021 1206   GFRNONAA 37 (L) 12/21/2019 0850   GFRAA 47 (L) 03/28/2020 0933   GFRAA 42 (L) 12/21/2019 0850    No results found for: SPEP, UPEP  Lab Results  Component Value Date   WBC 11.4 (H) 05/15/2021   NEUTROABS 9.1 (H) 05/15/2021   HGB 11.1 (L) 05/15/2021   HCT 31.5 (L) 05/15/2021   MCV 88.7 05/15/2021   PLT 237 05/15/2021      Chemistry       Component Value Date/Time   NA 138 05/15/2021 1206   NA 139 02/13/2017 1512   K 3.6 05/15/2021 1206   K 4.2 02/13/2017 1512   CL 101 05/15/2021 1206   CO2 27 05/15/2021 1206   CO2 28 02/13/2017 1512   BUN 22 05/15/2021 1206   BUN 25.2 02/13/2017 1512   CREATININE 1.23 (H) 05/15/2021 1206   CREATININE 1.37 (H) 12/21/2019 0850   CREATININE 1.2 (H) 02/13/2017 1512      Component Value Date/Time   CALCIUM 9.7 05/15/2021 1206   CALCIUM 10.0 02/13/2017 1512   ALKPHOS 71 05/15/2021 1206   AST 18 05/15/2021 1206   AST 19 12/21/2019 0850   ALT 12 05/15/2021 1206   ALT 13 12/21/2019 0850   BILITOT 0.5 05/15/2021 1206   BILITOT 0.4 12/21/2019 0850

## 2021-05-15 NOTE — Assessment & Plan Note (Addendum)
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

## 2021-05-15 NOTE — Assessment & Plan Note (Signed)
Renal function is stable. Monitor closely She was seen by nephrologist recently 

## 2021-05-16 ENCOUNTER — Telehealth: Payer: Self-pay

## 2021-05-16 NOTE — Telephone Encounter (Signed)
Called [name] back and left a message detailing Dr. Calton Dach reply about the COVID booster and her higher ANC value. Gardiner Rhyme, RN

## 2021-05-16 NOTE — Telephone Encounter (Signed)
Vicki Cervantes called because she forgot to ask Dr. Alvy Bimler yesterday about getting her COVID booster now.  Six weeks ago, she stated Dr. Alvy Bimler had told her to wait.  She also had a question about her [place] being high.  She has never noticed it to be too high and the result was 9.1 yesterday. Please advise and I will call her back. Gardiner Rhyme, RN

## 2021-05-16 NOTE — Telephone Encounter (Signed)
1) WBC can be high for many reasons (infection, stress, inflammation, lab error). I am not concerned about this, sometimes, with repeat labs it could go back to normal. 2) I have not recommend additional booster for patients who have received at least 1 booster. If she wants it, she can get it from the local pharmacy

## 2021-05-22 ENCOUNTER — Telehealth: Payer: Self-pay

## 2021-05-22 NOTE — Telephone Encounter (Signed)
Attempted to call patient back on primary number regarding questions related to booster and ANC.  No answer. Left voicemail providing patient with phone number to callback.

## 2021-05-24 ENCOUNTER — Telehealth: Payer: Self-pay | Admitting: *Deleted

## 2021-05-24 NOTE — Telephone Encounter (Addendum)
Patient Vicki Cervantes for Dr. Alvy Bimler nurse stating "we keep missing each other" and requested call back. Contacted patient at number provided - Vicki Cervantes for patient that office nurse will call her back before end of day.  Addendum - Late entry for 05/24/2021 - contacted patient at number provided - Vicki Cervantes that office called and encouraged patient to contact office with concerns or questions.

## 2021-05-28 ENCOUNTER — Telehealth: Payer: Self-pay | Admitting: *Deleted

## 2021-05-28 ENCOUNTER — Encounter: Payer: Self-pay | Admitting: Hematology and Oncology

## 2021-05-28 NOTE — Telephone Encounter (Signed)
Mrs. Happe called. She said she meant to ask Dr. Alvy Bimler at last appt about getting another Covid booster. She also wanted to know if it was ok that Neutro Abs were 9.1 on most recent labs.   Per patient chart, she had called office previously with these questions and was left following information in voice mail by RN on 05/16/2021:  Per Dr. Alvy Bimler - 1) WBC can be high for many reasons (infection, stress, inflammation, lab error). I am not concerned about this, sometimes, with repeat labs it could go back to normal. 2) I have not recommend additional booster for patients who have received at least 1 booster. If she wants it, she can get it from the local pharmacy.  Attempted to contact patient to provide information included in message above. Left information on voice mail. Encouraged patient to contact office for further questions or concerns.

## 2021-06-05 ENCOUNTER — Other Ambulatory Visit: Payer: Self-pay

## 2021-06-05 ENCOUNTER — Inpatient Hospital Stay: Payer: Medicare HMO

## 2021-06-05 VITALS — BP 144/55 | HR 68 | Temp 98.1°F | Resp 18 | Wt 131.5 lb

## 2021-06-05 DIAGNOSIS — C541 Malignant neoplasm of endometrium: Secondary | ICD-10-CM | POA: Diagnosis not present

## 2021-06-05 DIAGNOSIS — Z90710 Acquired absence of both cervix and uterus: Secondary | ICD-10-CM | POA: Diagnosis not present

## 2021-06-05 DIAGNOSIS — Z79899 Other long term (current) drug therapy: Secondary | ICD-10-CM | POA: Diagnosis not present

## 2021-06-05 DIAGNOSIS — Z5112 Encounter for antineoplastic immunotherapy: Secondary | ICD-10-CM | POA: Diagnosis not present

## 2021-06-05 DIAGNOSIS — C779 Secondary and unspecified malignant neoplasm of lymph node, unspecified: Secondary | ICD-10-CM | POA: Diagnosis not present

## 2021-06-05 DIAGNOSIS — N183 Chronic kidney disease, stage 3 unspecified: Secondary | ICD-10-CM | POA: Diagnosis not present

## 2021-06-05 DIAGNOSIS — Z9079 Acquired absence of other genital organ(s): Secondary | ICD-10-CM | POA: Diagnosis not present

## 2021-06-05 DIAGNOSIS — Z7982 Long term (current) use of aspirin: Secondary | ICD-10-CM | POA: Diagnosis not present

## 2021-06-05 DIAGNOSIS — D631 Anemia in chronic kidney disease: Secondary | ICD-10-CM | POA: Diagnosis not present

## 2021-06-05 DIAGNOSIS — Z90722 Acquired absence of ovaries, bilateral: Secondary | ICD-10-CM | POA: Diagnosis not present

## 2021-06-05 LAB — CBC WITH DIFFERENTIAL/PLATELET
Abs Immature Granulocytes: 0.02 10*3/uL (ref 0.00–0.07)
Basophils Absolute: 0.1 10*3/uL (ref 0.0–0.1)
Basophils Relative: 1 %
Eosinophils Absolute: 0.2 10*3/uL (ref 0.0–0.5)
Eosinophils Relative: 4 %
HCT: 29.5 % — ABNORMAL LOW (ref 36.0–46.0)
Hemoglobin: 10.1 g/dL — ABNORMAL LOW (ref 12.0–15.0)
Immature Granulocytes: 0 %
Lymphocytes Relative: 37 %
Lymphs Abs: 1.9 10*3/uL (ref 0.7–4.0)
MCH: 30.6 pg (ref 26.0–34.0)
MCHC: 34.2 g/dL (ref 30.0–36.0)
MCV: 89.4 fL (ref 80.0–100.0)
Monocytes Absolute: 0.5 10*3/uL (ref 0.1–1.0)
Monocytes Relative: 9 %
Neutro Abs: 2.5 10*3/uL (ref 1.7–7.7)
Neutrophils Relative %: 49 %
Platelets: 205 10*3/uL (ref 150–400)
RBC: 3.3 MIL/uL — ABNORMAL LOW (ref 3.87–5.11)
RDW: 12.1 % (ref 11.5–15.5)
WBC: 5.2 10*3/uL (ref 4.0–10.5)
nRBC: 0 % (ref 0.0–0.2)

## 2021-06-05 LAB — COMPREHENSIVE METABOLIC PANEL
ALT: 14 U/L (ref 0–44)
AST: 22 U/L (ref 15–41)
Albumin: 3.8 g/dL (ref 3.5–5.0)
Alkaline Phosphatase: 59 U/L (ref 38–126)
Anion gap: 10 (ref 5–15)
BUN: 22 mg/dL (ref 8–23)
CO2: 26 mmol/L (ref 22–32)
Calcium: 9.6 mg/dL (ref 8.9–10.3)
Chloride: 103 mmol/L (ref 98–111)
Creatinine, Ser: 1.19 mg/dL — ABNORMAL HIGH (ref 0.44–1.00)
GFR, Estimated: 46 mL/min — ABNORMAL LOW (ref >60)
Glucose, Bld: 93 mg/dL (ref 70–99)
Potassium: 3.6 mmol/L (ref 3.5–5.1)
Sodium: 139 mmol/L (ref 135–145)
Total Bilirubin: 0.4 mg/dL (ref 0.3–1.2)
Total Protein: 6.7 g/dL (ref 6.5–8.1)

## 2021-06-05 MED ORDER — TRASTUZUMAB-ANNS CHEMO 150 MG IV SOLR
6.0000 mg/kg | Freq: Once | INTRAVENOUS | Status: AC
  Administered 2021-06-05: 378 mg via INTRAVENOUS
  Filled 2021-06-05: qty 18

## 2021-06-05 MED ORDER — ACETAMINOPHEN 325 MG PO TABS
650.0000 mg | ORAL_TABLET | Freq: Once | ORAL | Status: AC
  Administered 2021-06-05: 650 mg via ORAL
  Filled 2021-06-05: qty 2

## 2021-06-05 MED ORDER — SODIUM CHLORIDE 0.9 % IV SOLN: Freq: Once | INTRAVENOUS | Status: AC

## 2021-06-05 MED ORDER — DIPHENHYDRAMINE HCL 25 MG PO CAPS
50.0000 mg | ORAL_CAPSULE | Freq: Once | ORAL | Status: AC
  Administered 2021-06-05: 50 mg via ORAL
  Filled 2021-06-05: qty 2

## 2021-06-05 MED ORDER — HEPARIN SOD (PORK) LOCK FLUSH 100 UNIT/ML IV SOLN
500.0000 [IU] | Freq: Once | INTRAVENOUS | Status: AC | PRN
  Administered 2021-06-05: 500 [IU]

## 2021-06-05 MED ORDER — SODIUM CHLORIDE 0.9% FLUSH
10.0000 mL | Freq: Once | INTRAVENOUS | Status: AC
  Administered 2021-06-05: 10 mL

## 2021-06-05 MED ORDER — SODIUM CHLORIDE 0.9% FLUSH
10.0000 mL | INTRAVENOUS | Status: DC | PRN
  Administered 2021-06-05: 10 mL

## 2021-06-05 NOTE — Patient Instructions (Signed)
[facility]  Discharge Instructions: ?Thank you for choosing [facility] to provide your oncology and hematology care.  ? ?If you have a lab appointment with the Cancer Center, please go directly to the Cancer Center and check in at the registration area. ?  ?Wear comfortable clothing and clothing appropriate for easy access to any Portacath or PICC line.  ? ?We strive to give you quality time with your provider. You may need to reschedule your appointment if you arrive late (15 or more minutes).  Arriving late affects you and other patients whose appointments are after yours.  Also, if you miss three or more appointments without notifying the office, you may be dismissed from the clinic at the provider?s discretion.    ?  ?For prescription refill requests, have your pharmacy contact our office and allow 72 hours for refills to be completed.   ? ?Today you received the following chemotherapy and/or immunotherapy agents: Trastuzumab    ?  ?To help prevent nausea and vomiting after your treatment, we encourage you to take your nausea medication as directed. ? ?BELOW ARE SYMPTOMS THAT SHOULD BE REPORTED IMMEDIATELY: ?*FEVER GREATER THAN 100.4 F (38 ?C) OR HIGHER ?*CHILLS OR SWEATING ?*NAUSEA AND VOMITING THAT IS NOT CONTROLLED WITH YOUR NAUSEA MEDICATION ?*UNUSUAL SHORTNESS OF BREATH ?*UNUSUAL BRUISING OR BLEEDING ?*URINARY PROBLEMS (pain or burning when urinating, or frequent urination) ?*BOWEL PROBLEMS (unusual diarrhea, constipation, pain near the anus) ?TENDERNESS IN MOUTH AND THROAT WITH OR WITHOUT PRESENCE OF ULCERS (sore throat, sores in mouth, or a toothache) ?UNUSUAL RASH, SWELLING OR PAIN  ?UNUSUAL VAGINAL DISCHARGE OR ITCHING  ? ?Items with * indicate a potential emergency and should be followed up as soon as possible or go to the Emergency Department if any problems should occur. ? ?Please show the CHEMOTHERAPY ALERT CARD or IMMUNOTHERAPY ALERT CARD at check-in  to the Emergency Department and triage nurse. ? ?Should you have questions after your visit or need to cancel or reschedule your appointment, please contact [facility]  Dept: [phone]  and follow the prompts.  Office hours are 8:00 a.m. to 4:30 p.m. Monday - Friday. Please note that voicemails left after 4:00 p.m. may not be returned until the following business day.  We are closed weekends and major holidays. You have access to a nurse at all times for urgent questions. Please call the main number to the clinic Dept: [phone] and follow the prompts. ? ? ?For any non-urgent questions, you may also contact your provider using MyChart. We now offer e-Visits for anyone 18 and older to request care online for non-urgent symptoms. For details visit [email or web address]. ?  ?Also download the MyChart app! Go to the app store, search "MyChart", open the app, select [facility], and log in with your MyChart username and password. ? ?Due to Covid, a mask is required upon entering the hospital/clinic. If you do not have a mask, one will be given to you upon arrival. For doctor visits, patients may have 1 support person aged 18 or older with them. For treatment visits, patients cannot have anyone with them due to current Covid guidelines and our immunocompromised population.  ? ?

## 2021-06-06 ENCOUNTER — Ambulatory Visit (HOSPITAL_COMMUNITY)
Admission: RE | Admit: 2021-06-06 | Discharge: 2021-06-06 | Disposition: A | Discharge status: Home / Self Care | Payer: Medicare HMO | Source: Ambulatory Visit | Attending: Hematology and Oncology | Admitting: Hematology and Oncology

## 2021-06-06 DIAGNOSIS — I10 Essential (primary) hypertension: Secondary | ICD-10-CM | POA: Diagnosis not present

## 2021-06-06 DIAGNOSIS — I351 Nonrheumatic aortic (valve) insufficiency: Secondary | ICD-10-CM | POA: Diagnosis not present

## 2021-06-06 DIAGNOSIS — Z5111 Encounter for antineoplastic chemotherapy: Secondary | ICD-10-CM | POA: Diagnosis not present

## 2021-06-06 DIAGNOSIS — C541 Malignant neoplasm of endometrium: Secondary | ICD-10-CM

## 2021-06-06 DIAGNOSIS — Z0189 Encounter for other specified special examinations: Secondary | ICD-10-CM | POA: Insufficient documentation

## 2021-06-06 DIAGNOSIS — I3481 Nonrheumatic mitral (valve) annulus calcification: Secondary | ICD-10-CM | POA: Insufficient documentation

## 2021-06-06 LAB — ECHOCARDIOGRAM COMPLETE
Area-P 1/2: 3.31 cm2
P 1/2 time: 534 msec
S' Lateral: 2.4 cm

## 2021-06-06 NOTE — Progress Notes (Signed)
  Echocardiogram 2D Echocardiogram has been performed.  Vicki Cervantes 06/06/2021, 9:02 AM

## 2021-06-16 DIAGNOSIS — J029 Acute pharyngitis, unspecified: Secondary | ICD-10-CM | POA: Diagnosis not present

## 2021-06-16 DIAGNOSIS — J209 Acute bronchitis, unspecified: Secondary | ICD-10-CM | POA: Diagnosis not present

## 2021-06-16 DIAGNOSIS — J069 Acute upper respiratory infection, unspecified: Secondary | ICD-10-CM | POA: Diagnosis not present

## 2021-06-16 DIAGNOSIS — Z20828 Contact with and (suspected) exposure to other viral communicable diseases: Secondary | ICD-10-CM | POA: Diagnosis not present

## 2021-06-16 DIAGNOSIS — R059 Cough, unspecified: Secondary | ICD-10-CM | POA: Diagnosis not present

## 2021-06-22 DIAGNOSIS — E039 Hypothyroidism, unspecified: Secondary | ICD-10-CM | POA: Diagnosis not present

## 2021-06-22 DIAGNOSIS — I10 Essential (primary) hypertension: Secondary | ICD-10-CM | POA: Diagnosis not present

## 2021-06-22 DIAGNOSIS — E785 Hyperlipidemia, unspecified: Secondary | ICD-10-CM | POA: Diagnosis not present

## 2021-06-25 DIAGNOSIS — Z1212 Encounter for screening for malignant neoplasm of rectum: Secondary | ICD-10-CM | POA: Diagnosis not present

## 2021-06-25 DIAGNOSIS — Z0000 Encounter for general adult medical examination without abnormal findings: Secondary | ICD-10-CM | POA: Diagnosis not present

## 2021-06-25 DIAGNOSIS — C7800 Secondary malignant neoplasm of unspecified lung: Secondary | ICD-10-CM | POA: Diagnosis not present

## 2021-06-25 DIAGNOSIS — Z1331 Encounter for screening for depression: Secondary | ICD-10-CM | POA: Diagnosis not present

## 2021-06-25 DIAGNOSIS — C541 Malignant neoplasm of endometrium: Secondary | ICD-10-CM | POA: Diagnosis not present

## 2021-06-25 DIAGNOSIS — Z1339 Encounter for screening examination for other mental health and behavioral disorders: Secondary | ICD-10-CM | POA: Diagnosis not present

## 2021-06-25 DIAGNOSIS — C50919 Malignant neoplasm of unspecified site of unspecified female breast: Secondary | ICD-10-CM | POA: Diagnosis not present

## 2021-06-25 DIAGNOSIS — N1832 Chronic kidney disease, stage 3b: Secondary | ICD-10-CM | POA: Diagnosis not present

## 2021-06-25 DIAGNOSIS — D849 Immunodeficiency, unspecified: Secondary | ICD-10-CM | POA: Diagnosis not present

## 2021-06-26 ENCOUNTER — Inpatient Hospital Stay: Payer: Medicare HMO | Attending: Gynecologic Oncology

## 2021-06-26 ENCOUNTER — Inpatient Hospital Stay: Payer: Medicare HMO

## 2021-06-26 ENCOUNTER — Other Ambulatory Visit: Payer: Self-pay

## 2021-06-26 ENCOUNTER — Inpatient Hospital Stay (HOSPITAL_BASED_OUTPATIENT_CLINIC_OR_DEPARTMENT_OTHER): Payer: Medicare HMO | Admitting: Hematology and Oncology

## 2021-06-26 DIAGNOSIS — C541 Malignant neoplasm of endometrium: Secondary | ICD-10-CM

## 2021-06-26 DIAGNOSIS — Z5112 Encounter for antineoplastic immunotherapy: Secondary | ICD-10-CM | POA: Diagnosis not present

## 2021-06-26 DIAGNOSIS — Z90710 Acquired absence of both cervix and uterus: Secondary | ICD-10-CM | POA: Insufficient documentation

## 2021-06-26 DIAGNOSIS — N183 Chronic kidney disease, stage 3 unspecified: Secondary | ICD-10-CM | POA: Insufficient documentation

## 2021-06-26 DIAGNOSIS — Z79899 Other long term (current) drug therapy: Secondary | ICD-10-CM | POA: Insufficient documentation

## 2021-06-26 DIAGNOSIS — C779 Secondary and unspecified malignant neoplasm of lymph node, unspecified: Secondary | ICD-10-CM | POA: Insufficient documentation

## 2021-06-26 DIAGNOSIS — D630 Anemia in neoplastic disease: Secondary | ICD-10-CM | POA: Diagnosis not present

## 2021-06-26 DIAGNOSIS — Z7982 Long term (current) use of aspirin: Secondary | ICD-10-CM | POA: Diagnosis not present

## 2021-06-26 LAB — COMPREHENSIVE METABOLIC PANEL
ALT: 12 U/L (ref 0–44)
AST: 18 U/L (ref 15–41)
Albumin: 3.8 g/dL (ref 3.5–5.0)
Alkaline Phosphatase: 66 U/L (ref 38–126)
Anion gap: 12 (ref 5–15)
BUN: 29 mg/dL — ABNORMAL HIGH (ref 8–23)
CO2: 26 mmol/L (ref 22–32)
Calcium: 9.9 mg/dL (ref 8.9–10.3)
Chloride: 101 mmol/L (ref 98–111)
Creatinine, Ser: 1.3 mg/dL — ABNORMAL HIGH (ref 0.44–1.00)
GFR, Estimated: 41 mL/min — ABNORMAL LOW (ref >60)
Glucose, Bld: 98 mg/dL (ref 70–99)
Potassium: 3.8 mmol/L (ref 3.5–5.1)
Sodium: 139 mmol/L (ref 135–145)
Total Bilirubin: 0.4 mg/dL (ref 0.3–1.2)
Total Protein: 7 g/dL (ref 6.5–8.1)

## 2021-06-26 LAB — CBC WITH DIFFERENTIAL/PLATELET
Abs Immature Granulocytes: 0.03 10*3/uL (ref 0.00–0.07)
Basophils Absolute: 0.1 10*3/uL (ref 0.0–0.1)
Basophils Relative: 1 %
Eosinophils Absolute: 0.2 10*3/uL (ref 0.0–0.5)
Eosinophils Relative: 4 %
HCT: 31.1 % — ABNORMAL LOW (ref 36.0–46.0)
Hemoglobin: 10.9 g/dL — ABNORMAL LOW (ref 12.0–15.0)
Immature Granulocytes: 1 %
Lymphocytes Relative: 28 %
Lymphs Abs: 1.7 10*3/uL (ref 0.7–4.0)
MCH: 31.1 pg (ref 26.0–34.0)
MCHC: 35 g/dL (ref 30.0–36.0)
MCV: 88.6 fL (ref 80.0–100.0)
Monocytes Absolute: 0.4 10*3/uL (ref 0.1–1.0)
Monocytes Relative: 7 %
Neutro Abs: 3.5 10*3/uL (ref 1.7–7.7)
Neutrophils Relative %: 59 %
Platelets: 260 10*3/uL (ref 150–400)
RBC: 3.51 MIL/uL — ABNORMAL LOW (ref 3.87–5.11)
RDW: 11.7 % (ref 11.5–15.5)
WBC: 5.9 10*3/uL (ref 4.0–10.5)
nRBC: 0 % (ref 0.0–0.2)

## 2021-06-26 MED ORDER — ACETAMINOPHEN 325 MG PO TABS
650.0000 mg | ORAL_TABLET | Freq: Once | ORAL | Status: AC
  Administered 2021-06-26: 650 mg via ORAL
  Filled 2021-06-26: qty 2

## 2021-06-26 MED ORDER — SODIUM CHLORIDE 0.9 % IV SOLN: Freq: Once | INTRAVENOUS | Status: AC

## 2021-06-26 MED ORDER — DIPHENHYDRAMINE HCL 25 MG PO CAPS
50.0000 mg | ORAL_CAPSULE | Freq: Once | ORAL | Status: AC
  Administered 2021-06-26: 50 mg via ORAL
  Filled 2021-06-26: qty 2

## 2021-06-26 MED ORDER — TRASTUZUMAB-ANNS CHEMO 150 MG IV SOLR
6.0000 mg/kg | Freq: Once | INTRAVENOUS | Status: AC
  Administered 2021-06-26: 378 mg via INTRAVENOUS
  Filled 2021-06-26: qty 18

## 2021-06-26 MED ORDER — SODIUM CHLORIDE 0.9% FLUSH
10.0000 mL | Freq: Once | INTRAVENOUS | Status: AC
  Administered 2021-06-26: 10 mL

## 2021-06-26 MED ORDER — HEPARIN SOD (PORK) LOCK FLUSH 100 UNIT/ML IV SOLN
500.0000 [IU] | Freq: Once | INTRAVENOUS | Status: AC | PRN
  Administered 2021-06-26: 500 [IU]

## 2021-06-26 MED ORDER — SODIUM CHLORIDE 0.9% FLUSH
10.0000 mL | INTRAVENOUS | Status: DC | PRN
  Administered 2021-06-26: 10 mL

## 2021-06-26 NOTE — Patient Instructions (Signed)
[facility]  Discharge Instructions: ?Thank you for choosing [facility] to provide your oncology and hematology care.  ? ?If you have a lab appointment with the Cancer Center, please go directly to the Cancer Center and check in at the registration area. ?  ?Wear comfortable clothing and clothing appropriate for easy access to any Portacath or PICC line.  ? ?We strive to give you quality time with your provider. You may need to reschedule your appointment if you arrive late (15 or more minutes).  Arriving late affects you and other patients whose appointments are after yours.  Also, if you miss three or more appointments without notifying the office, you may be dismissed from the clinic at the provider?s discretion.    ?  ?For prescription refill requests, have your pharmacy contact our office and allow 72 hours for refills to be completed.   ? ?Today you received the following chemotherapy and/or immunotherapy agents: Trastuzumab    ?  ?To help prevent nausea and vomiting after your treatment, we encourage you to take your nausea medication as directed. ? ?BELOW ARE SYMPTOMS THAT SHOULD BE REPORTED IMMEDIATELY: ?*FEVER GREATER THAN 100.4 F (38 ?C) OR HIGHER ?*CHILLS OR SWEATING ?*NAUSEA AND VOMITING THAT IS NOT CONTROLLED WITH YOUR NAUSEA MEDICATION ?*UNUSUAL SHORTNESS OF BREATH ?*UNUSUAL BRUISING OR BLEEDING ?*URINARY PROBLEMS (pain or burning when urinating, or frequent urination) ?*BOWEL PROBLEMS (unusual diarrhea, constipation, pain near the anus) ?TENDERNESS IN MOUTH AND THROAT WITH OR WITHOUT PRESENCE OF ULCERS (sore throat, sores in mouth, or a toothache) ?UNUSUAL RASH, SWELLING OR PAIN  ?UNUSUAL VAGINAL DISCHARGE OR ITCHING  ? ?Items with * indicate a potential emergency and should be followed up as soon as possible or go to the Emergency Department if any problems should occur. ? ?Please show the CHEMOTHERAPY ALERT CARD or IMMUNOTHERAPY ALERT CARD at check-in  to the Emergency Department and triage nurse. ? ?Should you have questions after your visit or need to cancel or reschedule your appointment, please contact [facility]  Dept: [phone]  and follow the prompts.  Office hours are 8:00 a.m. to 4:30 p.m. Monday - Friday. Please note that voicemails left after 4:00 p.m. may not be returned until the following business day.  We are closed weekends and major holidays. You have access to a nurse at all times for urgent questions. Please call the main number to the clinic Dept: [phone] and follow the prompts. ? ? ?For any non-urgent questions, you may also contact your provider using MyChart. We now offer e-Visits for anyone 18 and older to request care online for non-urgent symptoms. For details visit [email or web address]. ?  ?Also download the MyChart app! Go to the app store, search "MyChart", open the app, select , and log in with your MyChart username and password. ? ?Due to Covid, a mask is required upon entering the hospital/clinic. If you do not have a mask, one will be given to you upon arrival. For doctor visits, patients may have 1 support person aged 18 or older with them. For treatment visits, patients cannot have anyone with them due to current Covid guidelines and our immunocompromised population.  ? ?

## 2021-06-27 ENCOUNTER — Encounter: Payer: Self-pay | Admitting: Hematology and Oncology

## 2021-06-27 NOTE — Assessment & Plan Note (Signed)
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 

## 2021-06-27 NOTE — Assessment & Plan Note (Signed)
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

## 2021-06-27 NOTE — Progress Notes (Signed)
Vicki Cerv[facility] OFFICE PROGRESS NOTE  Patient Care Team: Vicki Bunting, [name]  ASSESSMENT & PLAN:  Endometrial cancer [facility]) She tolerated single agent trastuzumab well She has no signs of cancer recurrence I have reviewed CT imaging with the patient which is stable Her next CT imaging will be in 6 months, March 2023 I plan to continue echocardiogram monitoring every 3 months, next due in [facility]  CKD (chronic kidney disease), stage III ([place]) Renal function is stable. Monitor closely She was seen by nephrologist recently  Anemia in neoplastic disease She has multifactorial anemia, anemia secondary to prior chemotherapy as well as chronic kidney disease Anemia is improving/stable Recent vitamin B12 level was adequate She is not symptomatic Observe only  No orders of the defined types were placed in this encounter.   All questions were answered. The patient knows to call the clinic with any problems, questions or concerns. The total time spent in the appointment was 20 minutes encounter with patients including review of chart and various tests results, discussions about plan of care and coordination of care plan   Vicki Lark, MD 06/27/2021 3:20 PM  INTERVAL HISTORY: Please see below for problem oriented charting. 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 She tolerated recent treatment well No new lymphadenopathy Denies side effects from treatment such as shortness of breath or dizziness  REVIEW OF SYSTEMS:   Constitutional: Denies fevers, chills or abnormal weight loss Eyes: Denies blurriness of vision Ears, nose, mouth, throat, and face: Denies mucositis or sore throat Respiratory: Denies cough, dyspnea or wheezes Cardiovascular: Denies palpitation, chest discomfort or lower extremity swelling Gastrointestinal:  Denies nausea, heartburn or change in bowel  habits Skin: Denies abnormal skin rashes Lymphatics: Denies new lymphadenopathy or easy bruising Neurological:Denies numbness, tingling or new weaknesses Behavioral/Psych: Mood is stable, no new changes  All other systems were reviewed with the patient and are negative.  I have reviewed the past medical history, past surgical history, social history and family history with the patient and they are unchanged from previous note.  ALLERGIES:  has No Known Allergies.  MEDICATIONS:  Current Outpatient Medications  Medication Sig Dispense Refill   acetaminophen (TYLENOL) 500 MG tablet Take 1,000 mg by mouth every 6 (six) hours as needed.     aspirin EC 81 MG tablet Take 81 mg by mouth daily.     atorvastatin (LIPITOR) 20 MG tablet Take 20 mg by mouth every evening.      Calcium Carbonate-Vitamin D (CALCIUM-VITAMIN D) 500-200 MG-UNIT per tablet Take 1 tablet by mouth daily.     citalopram (CELEXA) 20 MG tablet Take 20 mg by mouth at bedtime.      estradiol (ESTRACE) 0.1 MG/GM vaginal cream PLACE 1 APPLICATORFUL VAGINALLY 3 TIMES A WEEK 42.5 g 12   gabapentin (NEURONTIN) 600 MG tablet Take 600 mg by mouth at bedtime as needed.     levothyroxine (SYNTHROID, LEVOTHROID) 100 MCG tablet Take 100 mcg by mouth daily before breakfast.      lidocaine-prilocaine (EMLA) cream Apply to affected area once 30 g 3   losartan-hydrochlorothiazide (HYZAAR) 100-25 MG tablet      magnesium oxide (MAG-OX) 400 (241.3 Mg) MG tablet Take 1 tablet (400 mg total) by mouth daily. 30 tablet 11   metoprolol succinate (TOPROL-XL) 25 MG 24 hr tablet Take 25 mg by mouth daily.     Multiple Vitamin (MULTIVITAMIN WITH MINERALS) TABS Take  1 tablet by mouth daily.     ondansetron (ZOFRAN) 8 MG tablet Take 1 tablet (8 mg total) by mouth every 8 (eight) hours as needed. Start on the third day after chemotherapy. 30 tablet 1   valACYclovir (VALTREX) 1000 MG tablet valacyclovir 1 gram tablet     No current facility-administered  medications for this visit.    SUMMARY OF ONCOLOGIC HISTORY: Oncology History Overview Note  Vicki Cervantes  has a remote history of left  breast cancer at age 77 but received BRCA testing approximately 5 years ago which was negative. Her cancer was treated with surgery but no radiation or chemotherapy.   Serous endometrial cancer, MSI stable ER positive, PR neg, Her2/neu 3+      Endometrial cancer ([place])  08/29/2016 Pathology Results   Endometrium, biopsy - HIGH GRADE ENDOMETRIAL CARCINOMA, SEE COMMENT. Microscopic Comment The sections show multiple fragments of adenocarcinoma displaying glandular and papillary patterns associated with high grade cytomorphology characterized by nuclear pleomorphism, prominent nucleoli and brisk mitosis. Immunohistochemical stains show that the tumor cells are positive for vimentin, p16, p53 with increased Ki-67 expression. Estrogen and progesterone receptor stains show patchy weak positivity. No significant positivity is seen with CEA. The findings are consistent with high grade endometrial carcinoma and the overall morphology and phenotypic features favor serous carcinoma.   08/29/2016 Initial Diagnosis   She presented with postmenopausal bleeding   10/03/2016 Imaging   CT C/A/P 09/2016 IMPRESSION: 1. Thickening of the endometrial canal up to 19 mm in fundus, presumably corresponding to the patient's reported endometrial carcinoma. 2. Multiple tiny pulmonary nodules scattered throughout the lungs bilaterally measuring 4 mm or less in size. Nodules of this size are typically considered statistically likely benign. In the setting of known primary malignancy, metastatic disease to the lungs is not excluded, but is not strongly favored on today's examination. Attention on followup studies is recommended to ensure the stability or resolution of these nodules. 3. Subcentimeter low-attenuation lesion in the central aspect of segment 8 of the liver is too small to  characterize. This is statistically likely a tiny cyst, but warrants attention on follow-up studies to exclude the possibility of a solitary hepatic metastasis. 4. 1.5 x 1.5 x 1.7 cm well-circumscribed lesion in the proximal stomach. This is of uncertain etiology and significance, and could represent a benign gastric polyp, however, further evaluation with nonemergent endoscopy is suggested in the near future for further evaluation. 5. **An incidental finding of potential clinical significance has been found. 1.1 x 1.6 cm thyroid nodule in the inferior aspect of the right lobe of the thyroid gland. Follow-up evaluation with nonemergent thyroid ultrasound is recommended in the near future to better evaluate this finding. This recommendation follows ACR consensuss guidelines: Managing Incidental Thyroid Nodules Detected on Imaging: White Paper of the ACR Incidental Thyroid Findings Committee. J Am Coll Radiol 2015;12(2):143-150.** 6. Aortic atherosclerosis, in addition to left anterior descending coronary artery disease   10/22/2016 Surgery   Robotic assisted total hysterectomy, BSO and bilateral pelvic lymphadenectomy  Final pathology revealed a 3cm polyp containing serous carcinoma but with no myometrial invasion, no LVSI and negative nodes.  Stage IA Uterine serous cancer   10/22/2016 Pathology Results   1. Lymph nodes, regional resection, right pelvic - SIX BENIGN LYMPH NODES (0/6). 2. Lymph nodes, regional resection, left pelvic - SEVEN BENIGN LYMPH NODES (0/7). 3. Uterus +/- tubes/ovaries, neoplastic, with right ovary and fallopian tube ENDOMYOMETRIUM - SEROUS CARCINOMA ARISING WITHIN AN ENDOMETRIAL POLYP - NO MYOMETRIAL  INVASION IDENTIFIED - ADENOMYOSIS - LEIOMYOMA (1 CM) - SEE ONCOLOGY TABLE AND COMMENT CERVIX - CARCINOMA FOCALLY INVOLVES ENDOCERVICAL GLANDS - NABOTHIAN CYSTS RIGHT ADNEXA - BENIGN OVARY AND FALLOPIAN TUBE - NO CARCINOMA IDENTIFIED 4. Cul-de-sac biopsy - MESOTHELIAL  HYPERPLASIA Microscopic Comment 3. ONCOLOGY TABLE-UTERUS, CARCINOMA OR CARCINOSARCOMA Specimen: Uterus, right fallopian tube and ovary Procedure: Total hysterectomy and right salpingo-oophorectomy Lymph node sampling performed: Bilateral pelvic regional resection Specimen integrity: Intact Maximum tumor size: 3 cm (polyp) Histologic type: Serous carcinoma Grade: High grade Myometrial invasion: Not identified Cervical stromal involvement: No, focal endocervical gland involvement Extent of involvement of other organs: Not identified Lymph - vascular invasion: Not identified Peritoneal washings: N/A Lymph nodes: Examined: 0 Sentinel 13 Non-sentinel 13 Total Lymph nodes with metastasis: 0 Isolated tumor cells (< 0.2 mm): 0 Micrometastasis: (> 0.2 mm and < 2.0 mm): 0 Macrometastasis: (> 2.0 mm): 0 Extracapsular extension: N/A Pelvic lymph nodes: 0 involved of 13 lymph nodes. Para-aortic lymph nodes: No para-aortic nodes submitted TNM code: pT1a, pNX FIGO Stage (based on pathologic findings, needs clinical correlation): IA Comment: Immunohistochemistry for cytokeratin AE1/AE3 is performed on all of the lymph nodes (parts 1 & 2) and no metastatic carcinoma is identified.   07/04/2017 Genetic Testing   The patient had genetic testing due to a personal history of breast and uterine cancer, and a family history of stomach cancer.  The Multi-Cancer Panel was ordered. The Multi-Cancer Panel offered by [facility] includes sequencing and/or deletion duplication testing of the following 83 genes: ALK, APC, ATM, AXIN2,BAP1,  BARD1, BLM, BMPR1A, BRCA1, BRCA2, BRIP1, CASR, CDC73, CDH1, CDK4, CDKN1B, CDKN1C, CDKN2A (p14ARF), CDKN2A (p16INK4a), CEBPA, CHEK2, CTNNA1, DICER1, DIS3L2, EGFR (c.2369C>T, p.Thr790Met variant only), EPCAM (Deletion/duplication testing only), FH, FLCN, GATA2, GPC3, GREM1 (Promoter region deletion/duplication testing only), HOXB13 (c.251G>A, p.Gly84Glu), HRAS, KIT, MAX, MEN1, MET,  MITF (c.952G>A, p.Glu318Lys variant only), MLH1, MSH2, MSH3, MSH6, MUTYH, NBN, NF1, NF2, NTHL1, PALB2, PDGFRA, PHOX2B, PMS2, POLD1, POLE, POT1, PRKAR1A, PTCH1, PTEN, RAD50, RAD51C, RAD51D, RB1, RECQL4, RET, RUNX1, SDHAF2, SDHA (sequence changes only), SDHB, SDHC, SDHD, SMAD4, SMARCA4, SMARCB1, SMARCE1, STK11, SUFU, TERC, TERT, TMEM127, TP53, TSC1, TSC2, VHL, WRN and WT1.   Results: No pathogenic variants were identified.  A variant of uncertain significance in the gene APC was identified.  c.791A>G (p.Gln264Arg).  The date of this test report is 07/04/2017.     Genetic Testing   Patient has genetic testing done for MSI. 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    12/03/2017 Imaging   CT Chest/Abd/Pelvis to follow pulmonary nodule and gastric mass IMPRESSION: 1. Stable CT of the chest. Small pulmonary nodules are unchanged when compared with previous exam. 2. No new findings identified. 3. Subcentimeter low-attenuation lesions within the liver are remain too small to characterize but are stable from prior exam. 4. Persistent indeterminate low-attenuation structure within the proximal stomach is unchanged measuring 1.4 cm. Correlation with direct visualization is advised   06/30/2018 Imaging   CT CHEST Lungs/Pleura: Stable scattered sub-cm pulmonary nodules are again seen bilaterally and are stable compared to previous studies. No new or enlarging pulmonary nodules or masses identified. No evidence of pulmonary infiltrate or pleural effusion.   08/19/2018 Echocardiogram   [place] is done in [place]: EF 60-65%. Mild impaired relaxation. (report scanned)   09/17/2018 Relapse/Recurrence   Presented with c/o vagina discharge.  Lesion noted at the left vaginal apex 33m lesion removed Path c/w high grade serous cancer   09/17/2018 Pathology Results   Vagina,  biopsy, left apex - HIGH GRADE SEROUS CARCINOMA.   09/28/2018 PET scan   1. Two hypermetabolic axial lymph nodes.  Unusual site for metastatic endometrial carcinoma however the activity is more intense than typically seen in reactive adenopathy. Suggest ultrasound-guided percutaneous biopsy of the larger RIGHT axial lymph node 2. No evidence of local recurrence at the vaginal cuff.  3. No metastatic adenopathy in the abdomen or pelvis. 4. Stable small pulmonary nodules   10/09/2018 Pathology Results   Lymph node, needle/core biopsy, right axilla - METASTATIC CARCINOMA, SEE COMMENT. Microscopic Comment The carcinoma appears high grade. Immunohistochemistry is positive for cytokeratin 7, PAX-8, and ER. Cytokeratin 20, CDX-2, PR, and GATA-3 are negative. The findings along with the history are consistent with a gynecologic primary.    10/09/2018 Procedure   Ultrasound-guided core biopsies of a right axillary lymph node.   10/14/2018 Cancer Staging   Staging form: Corpus Uteri - Carcinoma and Carcinosarcoma, AJCC 8th Edition - Clinical: Stage IVB (cT1, cN0, pM1) - Signed by Vicki Lark, MD on 10/14/2018    10/19/2018 Imaging   Placement of single lumen port a cath via right internal jugular vein. The catheter tip lies at the cavo-atrial junction. A power injectable port a cath was placed and is ready for immediate use.    10/21/2018 -  Chemotherapy   The patient had carboplatin and taxol for treatment   11/11/2018 -  Chemotherapy   Patient is on Treatment Plan : BREAST Trastuzumab q21d     12/21/2018 PET scan   1. Interval resolution of hypermetabolic right axillary lymph nodes. No metabolic findings highly suspicious for recurrent metastatic disease. 2. New mild hypermetabolism within a borderline prominent portacaval lymph node, nonspecific. While a reactive node is favored, a lymph node metastasis cannot be entirely excluded. Suggest attention to this lymph node on follow-up PET-CT in 3-6 months. 3. Nonspecific new hypermetabolism at the ileocecal valve, more likely physiologic given absence of CT  correlate. 4. Scattered subcentimeter pulmonary nodules are all stable and below PET resolution, more likely benign, continued CT surveillance advised. 5. Chronic findings include: Aortic Atherosclerosis (ICD10-I70.0). Marked diffuse colonic diverticulosis. Coronary atherosclerosis.     12/28/2018 Echocardiogram   1. The left ventricle has normal systolic function with an ejection fraction of 60-65%. The cavity size was normal. Left ventricular diastolic Doppler parameters are consistent with impaired relaxation.  2. GLS recorded as -10.7 but LV appears hyperdynamic and tracking of endocardium appears poor.  3. The right ventricle has normal systolic function. The cavity was normal. There is no increase in right ventricular wall thickness.  4. Mild thickening of the mitral valve leaflet.  5. The aortic valve was not well visualized. Mild thickening of the aortic valve. Aortic valve regurgitation is trivial by color flow Doppler.   03/23/2019 Imaging   PET 1. No findings of hypermetabolic residual/recurrent or metastatic disease. 2. Similar low-level hypermetabolism within normal sized portocaval and right inguinal nodes, favored to be reactive. 3. Ongoing stability of small bilateral pulmonary nodules, favored to be benign. Below PET resolution. 4. Coronary artery atherosclerosis. Aortic Atherosclerosis   03/26/2019 Echocardiogram   1. The left ventricle has normal systolic function, with an ejection fraction of 55-60%. The cavity size was normal. Left ventricular diastolic Doppler parameters are consistent with impaired relaxation.  2. The right ventricle has normal systolic function. The cavity was normal.  3. The mitral valve is abnormal. Mild thickening of the mitral valve leaflet. There is mild mitral annular calcification present.  4.  The tricuspid valve is grossly normal.  5. The aortic valve is tricuspid. Mild calcification of the aortic valve. No stenosis of the aortic valve.  6.  The aorta is normal unless otherwise noted.  7. Normal LV systolic function; grade 1 diastolic dysfunction; BTY-60.6%.   06/30/2019 Imaging   Chest Impression:   1. No evidence thoracic metastasis. 2. Stable small benign-appearing pulmonary nodules   Abdomen / Pelvis Impression:   1. No evidence of metastatic disease in the abdomen pelvis. 2.  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 3.  Aortic Atherosclerosis (ICD10-I70.0).   06/30/2019 Echocardiogram    1. Left ventricular ejection fraction, by visual estimation, is 60 to 65%. The left ventricle has normal function. There is no left ventricular hypertrophy.  2. Left ventricular diastolic parameters are consistent with Grade I diastolic dysfunction (impaired relaxation).  3. The left ventricle has no regional wall motion abnormalities.  4. Global right ventricle has normal systolic function.The right ventricular size is normal. No increase in right ventricular wall thickness.  5. Left atrial size was mild-moderately dilated.  6. Right atrial size was normal.  7. The mitral valve is normal in structure. Trivial mitral valve regurgitation.  8. The tricuspid valve is normal in structure. Tricuspid valve regurgitation is trivial.  9. The aortic valve is normal in structure. Aortic valve regurgitation is trivial. No evidence of aortic valve sclerosis or stenosis. 10. The pulmonic valve was grossly normal. Pulmonic valve regurgitation is not visualized. 11. Mildly elevated pulmonary artery systolic pressure. 12. The inferior vena cava is normal in size with greater than 50% respiratory variability, suggesting right atrial pressure of 3 mmHg. 13. The average left ventricular global longitudinal strain is -12.5 %. GLS underestimated due to poor endocardial tracking.     09/27/2019 Imaging   1. Multiple small, nonspecific pulmonary nodules are again noted. The previously noted lung nodules are unchanged in size from previous  exam. There is a new lung nodule within the medial right lower lobe which is nonspecific measure 4 mm. Attention at follow-up imaging is recommended. 2. No evidence of metastatic disease within the abdomen or pelvis. 3. Coronary artery calcifications.  The 4.  Aortic Atherosclerosis (ICD10-I70.0).   09/30/2019 Echocardiogram    1. Left ventricular ejection fraction, by estimation, is 60 to 65%. The left ventricle has normal function. The left ventricle has no regional wall motion abnormalities. Left ventricular diastolic parameters are consistent with Grade I diastolic dysfunction (impaired relaxation).  2. Right ventricular systolic function is normal. The right ventricular size is normal. There is normal pulmonary artery systolic pressure. The estimated right ventricular systolic pressure is 00.4 mmHg.  3. The mitral valve is normal in structure. No evidence of mitral valve regurgitation. No evidence of mitral stenosis.  4. The aortic valve is normal in structure. Aortic valve regurgitation is trivial. No aortic stenosis is present.  5. The inferior vena cava is normal in size with greater than 50% respiratory variability, suggesting right atrial pressure of 3 mmHg.     01/07/2020 Echocardiogram    1. Normal LV function; grade 1 diastolic dysfunction; mild AI; GLS -21%.  2. Left ventricular ejection fraction, by estimation, is 55 to 60%. The left ventricle has normal function. The left ventricle has no regional wall motion abnormalities. Left ventricular diastolic parameters are consistent with Grade I diastolic dysfunction (impaired relaxation).  3. Right ventricular systolic function is normal. The right ventricular size is normal.  4. The mitral valve is normal in structure. Trivial  mitral valve regurgitation. No evidence of mitral stenosis.  5. The aortic valve is tricuspid. Aortic valve regurgitation is mild. Mild aortic valve sclerosis is present, with no evidence of aortic valve stenosis.   6. The inferior vena cava is normal in size with greater than 50% respiratory variability, suggesting right atrial pressure of 3 mmHg.     03/27/2020 Imaging   1. Status post hysterectomy. No evidence of recurrent or metastatic disease in the abdomen or pelvis. 2. Multiple small pulmonary nodules in the lung bases are unchanged to the extent that they are included on current examination and remain most likely sequelae of prior infection or inflammation. Attention on follow-up. 3. Aortic Atherosclerosis (ICD10-I70.0).   04/17/2020 Echocardiogram   1. Normal LVEF, normal and unchanged GLS: -20.2%.  2. Left ventricular ejection fraction, by estimation, is 60 to 65%. The left ventricle has normal function. The left ventricle has no regional wall motion abnormalities. There is mild concentric left ventricular hypertrophy. Left ventricular diastolic parameters are consistent with Grade I diastolic dysfunction (impaired relaxation). The average left ventricular global longitudinal strain is -20.2 %. The global longitudinal strain is normal.  3. Right ventricular systolic function is normal. The right ventricular size is normal.  4. The mitral valve is normal in structure. Mild mitral valve regurgitation. No evidence of mitral stenosis.  5. The aortic valve is normal in structure. There is mild calcification of the aortic valve. There is mild thickening of the aortic valve. Aortic valve regurgitation is mild. No aortic stenosis is present.  6. The inferior vena cava is normal in size with greater than 50% respiratory variability, suggesting right atrial pressure of 3 mmHg.   08/02/2020 Echocardiogram   1. Left ventricular ejection fraction, by estimation, is 55 to 60%. The left ventricle has normal function. The left ventricle has no regional wall motion abnormalities. Left ventricular diastolic parameters are consistent with Grade I diastolic dysfunction (impaired relaxation). The average left ventricular  global longitudinal strain is -27.0 %. The global longitudinal strain is normal.  2. Right ventricular systolic function is normal. The right ventricular size is normal. Tricuspid regurgitation signal is inadequate for assessing PA pressure.  3. The mitral valve is grossly normal. Trivial mitral valve regurgitation. No evidence of mitral stenosis.  4. The aortic valve is tricuspid. Aortic valve regurgitation is mild. No aortic stenosis is present.  5. The inferior vena cava is normal in size with greater than 50% respiratory variability, suggesting right atrial pressure of 3 mmHg.   09/19/2020 Imaging   1. Status post hysterectomy. No evidence of recurrent or new metastatic disease within the chest, abdomen, or pelvis. 2. Scattered bilateral tiny pulmonary nodules, unchanged from prior studies and most likely sequela of prior infection or inflammation, recommend attention on follow-up imaging. No new suspicious pulmonary nodules or masses. 3. Subcutaneous edema and small hematoma overlying the posterior gluteal musculature, recommend correlation with recent history of trauma. 4. Mild low-density wall thickening of the gastric antrum, which may represent gastritis. 5. Hepatic steatosis. 6. Extensive sigmoid colonic diverticulosis without findings of acute diverticulitis. 7. Aortic atherosclerosis.  Aortic Atherosclerosis (ICD10-I70.0).   11/20/2020 Echocardiogram    1. Compared to echo from Jan 2022, Global longitudinal strain is less negative. (Previously -27%). Left ventricular ejection fraction, by estimation, is 60 to 65%. The left ventricle has normal function. The left ventricle has no regional wall motion abnormalities. There is mild left ventricular hypertrophy. Left ventricular diastolic parameters are indeterminate.  2. Right ventricular systolic function is  normal. The right ventricular size is normal.  3. The mitral valve is normal in structure. Trivial mitral valve regurgitation.  4.  Aortic valve regurgitation is mild. Mild aortic valve sclerosis is present, with no evidence of aortic valve stenosis.     03/12/2021 Echocardiogram   1. Left ventricular ejection fraction, by estimation, is 60 to 65%. Left ventricular ejection fraction by 2D MOD biplane is 63.6 %. The left ventricle has normal function. The left ventricle has no regional wall motion abnormalities. There is mild left ventricular hypertrophy. Left ventricular diastolic parameters are consistent with Grade I diastolic dysfunction (impaired relaxation). The average left ventricular global longitudinal strain is -20.0 %. The global longitudinal strain is normal.  2. Right ventricular systolic function is normal. The right ventricular size is normal. There is normal pulmonary artery systolic pressure. The estimated right ventricular systolic pressure is 48.5 mmHg.  3. The mitral valve is normal in structure. No evidence of mitral valve regurgitation. No evidence of mitral stenosis.  4. The aortic valve is tricuspid. Aortic valve regurgitation is trivial. No aortic stenosis is present. Aortic regurgitation PHT measures 403 msec.  5. The inferior vena cava is normal in size with greater than 50% respiratory variability, suggesting right atrial pressure of 3 mmHg.   04/02/2021 Imaging   Stable small pulmonary nodules scattered throughout the chest, unchanged, without new or suspicious pulmonary nodule.   Three-vessel coronary artery disease.   Aortic Atherosclerosis (ICD10-I70.0).   06/06/2021 Imaging    1. Left ventricular ejection fraction, by estimation, is 60 to 65%. The left ventricle has normal function. The left ventricle has no regional wall motion abnormalities. Left ventricular diastolic parameters are consistent with Grade I diastolic dysfunction (impaired relaxation).  2. Right ventricular systolic function is normal. The right ventricular size is normal. There is normal pulmonary artery systolic pressure. The  estimated right ventricular systolic pressure is 46.2 mmHg.  3. Left atrial size was mildly dilated.  4. The mitral valve is normal in structure. Trivial mitral valve regurgitation.  5. The aortic valve is tricuspid. Aortic valve regurgitation is mild. No aortic stenosis is present.  6. The inferior vena cava is normal in size with greater than 50% respiratory variability, suggesting right atrial pressure of 3 mmHg.  7. GLS attempted but not reported due to suboptimal tracking.     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/2018 Initial Diagnosis   Metastasis to lymph nodes ([place])   10/21/2018 - 02/24/2019 Chemotherapy   The patient had palonosetron (ALOXI) injection 0.25 mg, 0.25 mg, Intravenous,  Once, 7 of 7 cycles Administration: 0.25 mg (10/21/2018), 0.25 mg (11/11/2018), 0.25 mg (12/02/2018), 0.25 mg (12/23/2018), 0.25 mg (01/13/2019), 0.25 mg (02/03/2019), 0.25 mg (02/24/2019) CARBOplatin (PARAPLATIN) 310 mg in sodium chloride 0.9 % 250 mL chemo infusion, 310 mg, Intravenous,  Once, 7 of 7 cycles Dose modification: 300 mg (original dose 311.5 mg, Cycle 4, Reason: Dose not tolerated) Administration: 310 mg (10/21/2018), 300 mg (11/11/2018), 300 mg (12/02/2018), 300 mg (12/23/2018), 300 mg (01/13/2019), 300 mg (02/03/2019), 300 mg (02/24/2019) PACLitaxel (TAXOL) 222 mg in sodium chloride 0.9 % 250 mL chemo infusion (> 65m/m2), 135 mg/m2 = 222 mg, Intravenous,  Once, 7 of 7 cycles Administration: 222 mg (10/21/2018), 222 mg (11/11/2018), 222 mg (12/02/2018), 222 mg (12/23/2018), 222 mg (01/13/2019), 222 mg (02/03/2019), 222 mg (02/24/2019) fosaprepitant (EMEND) 150 mg, dexamethasone (DECADRON) 12 mg in sodium chloride 0.9 % 145 mL IVPB, , Intravenous,  Once, 7 of 7 cycles Administration:  (10/21/2018),  (  11/11/2018),  (12/02/2018),  (12/23/2018),  (01/13/2019),  (02/03/2019),  (02/24/2019)   for chemotherapy treatment.       PHYSICAL EXAMINATION: ECOG PERFORMANCE STATUS: 0 - Asymptomatic  Vitals:   06/26/21 1216  BP: 107/75   Pulse: 66  Resp: 18  Temp: 97.8 F (36.6 C)  SpO2: 96%   Filed Weights   06/26/21 1216  Weight: 129 lb (58.5 kg)    GENERAL:alert, no distress and comfortable SKIN: skin color, texture, turgor are normal, no rashes or significant lesions EYES: normal, Conjunctiva are pink and non-injected, sclera clear OROPHARYNX:no exudate, no erythema and lips, buccal mucosa, and tongue normal  NECK: supple, thyroid normal size, non-tender, without nodularity LYMPH:  no palpable lymphadenopathy in the cervical, axillary or inguinal LUNGS: clear to auscultation and percussion with normal breathing effort HEART: regular rate & rhythm and no murmurs and no lower extremity edema ABDOMEN:abdomen soft, non-tender and normal bowel sounds Musculoskeletal:no cyanosis of digits and no clubbing  NEURO: alert & oriented x 3 with fluent speech, no focal motor/sensory deficits  LABORATORY DATA:  I have reviewed the data as listed    Component Value Date/Time   NA 139 06/26/2021 1150   NA 139 02/13/2017 1512   K 3.8 06/26/2021 1150   K 4.2 02/13/2017 1512   CL 101 06/26/2021 1150   CO2 26 06/26/2021 1150   CO2 28 02/13/2017 1512   GLUCOSE 98 06/26/2021 1150   GLUCOSE 118 02/13/2017 1512   BUN 29 (H) 06/26/2021 1150   BUN 25.2 02/13/2017 1512   CREATININE 1.30 (H) 06/26/2021 1150   CREATININE 1.37 (H) 12/21/2019 0850   CREATININE 1.2 (H) 02/13/2017 1512   CALCIUM 9.9 06/26/2021 1150   CALCIUM 10.0 02/13/2017 1512   PROT 7.0 06/26/2021 1150   ALBUMIN 3.8 06/26/2021 1150   AST 18 06/26/2021 1150   AST 19 12/21/2019 0850   ALT 12 06/26/2021 1150   ALT 13 12/21/2019 0850   ALKPHOS 66 06/26/2021 1150   BILITOT 0.4 06/26/2021 1150   BILITOT 0.4 12/21/2019 0850   GFRNONAA 41 (L) 06/26/2021 1150   GFRNONAA 37 (L) 12/21/2019 0850   GFRAA 47 (L) 03/28/2020 0933   GFRAA 42 (L) 12/21/2019 0850    No results found for: SPEP, UPEP  Lab Results  Component Value Date   WBC 5.9 06/26/2021    NEUTROABS 3.5 06/26/2021   HGB 10.9 (L) 06/26/2021   HCT 31.1 (L) 06/26/2021   MCV 88.6 06/26/2021   PLT 260 06/26/2021      Chemistry      Component Value Date/Time   NA 139 06/26/2021 1150   NA 139 02/13/2017 1512   K 3.8 06/26/2021 1150   K 4.2 02/13/2017 1512   CL 101 06/26/2021 1150   CO2 26 06/26/2021 1150   CO2 28 02/13/2017 1512   BUN 29 (H) 06/26/2021 1150   BUN 25.2 02/13/2017 1512   CREATININE 1.30 (H) 06/26/2021 1150   CREATININE 1.37 (H) 12/21/2019 0850   CREATININE 1.2 (H) 02/13/2017 1512      Component Value Date/Time   CALCIUM 9.9 06/26/2021 1150   CALCIUM 10.0 02/13/2017 1512   ALKPHOS 66 06/26/2021 1150   AST 18 06/26/2021 1150   AST 19 12/21/2019 0850   ALT 12 06/26/2021 1150   ALT 13 12/21/2019 0850   BILITOT 0.4 06/26/2021 1150   BILITOT 0.4 12/21/2019 0850       RADIOGRAPHIC STUDIES: I have personally reviewed the radiological images as listed and agreed with  the findings in the report. ECHOCARDIOGRAM COMPLETE  Result Date: 06/06/2021    ECHOCARDIOGRAM REPORT   Patient Name:   Vicki Cervantes Date of Exam: 06/06/2021 Medical Rec #:  [id number]      Height:       62.0 in Accession #:    [id number]     Weight:       131.5 lb Date of Birth:  1940-12-16      BSA:          1.600 m Patient Age:    25 years       BP:           155/75 mmHg Patient Gender: F              HR:           65 bpm. Exam Location:  Outpatient Procedure: 2D Echo, Cardiac Doppler, Color Doppler and Strain Analysis Indications:    Chemo Z09  History:        Patient has prior history of Echocardiogram examinations, most                 recent 03/12/2021. Risk Factors:Hypertension.  Sonographer:    Bernadene Person RDCS Referring Phys: [id number] NI Saw[place]  1. Left ventricular ejection fraction, by estimation, is 60 to 65%. The left ventricle has normal function. The left ventricle has no regional wall motion abnormalities. Left ventricular diastolic parameters are consistent with  Grade I diastolic dysfunction (impaired relaxation).  2. Right ventricular systolic function is normal. The right ventricular size is normal. There is normal pulmonary artery systolic pressure. The estimated right ventricular systolic pressure is 27.5 mmHg.  3. Left atrial size was mildly dilated.  4. The mitral valve is normal in structure. Trivial mitral valve regurgitation.  5. The aortic valve is tricuspid. Aortic valve regurgitation is mild. No aortic stenosis is present.  6. The inferior vena cava is normal in size with greater than 50% respiratory variability, suggesting right atrial pressure of 3 mmHg.  7. GLS attempted but not reported due to suboptimal tracking. Comparison(s): Compared to prior TTE on 03/12/21, no GLS reported on current study due to suboptimal tracking. Otherwise, there is no significant change. FINDINGS  Left Ventricle: Left ventricular ejection fraction, by estimation, is 60 to 65%. The left ventricle has normal function. The left ventricle has no regional wall motion abnormalities. Global longitudinal strain performed but not reported based on interpreter judgement due to suboptimal tracking. The left ventricular internal cavity size was normal in size. There is no left ventricular hypertrophy. Left ventricular diastolic parameters are consistent with Grade I diastolic dysfunction (impaired relaxation). Right Ventricle: The right ventricular size is normal. No increase in right ventricular wall thickness. Right ventricular systolic function is normal. There is normal pulmonary artery systolic pressure. The tricuspid regurgitant velocity is 2.53 m/s, and  with an assumed right atrial pressure of 3 mmHg, the estimated right ventricular systolic pressure is 17.0 mmHg. Left Atrium: Left atrial size was mildly dilated. Right Atrium: Right atrial size was normal in size. Pericardium: There is no evidence of pericardial effusion. Mitral Valve: The mitral valve is normal in structure. There is  mild thickening of the mitral valve leaflet(s). Mild mitral annular calcification. Trivial mitral valve regurgitation. Tricuspid Valve: The tricuspid valve is normal in structure. Tricuspid valve regurgitation is trivial. Aortic Valve: The aortic valve is tricuspid. Aortic valve regurgitation is mild. Aortic regurgitation PHT measures 534 msec. No aortic stenosis is present. Pulmonic Valve:  The pulmonic valve was not well visualized. Pulmonic valve regurgitation is trivial. Aorta: The aortic root and ascending aorta are structurally normal, with no evidence of dilitation. Venous: The inferior vena cava is normal in size with greater than 50% respiratory variability, suggesting right atrial pressure of 3 mmHg. IAS/Shunts: No atrial level shunt detected by color flow Doppler.  LEFT VENTRICLE PLAX 2D LVIDd:         4.17 cm   Diastology LVIDs:         2.40 cm   LV e' medial:    5.63 cm/s LV PW:         0.67 cm   LV E/e' medial:  15.6 LV IVS:        0.88 cm   LV e' lateral:   6.83 cm/s LVOT diam:     1.90 cm   LV E/e' lateral: 12.9 LV SV:         50 LV SV Index:   32 LVOT Area:     2.84 cm  RIGHT VENTRICLE RV S prime:     10.50 cm/s TAPSE (M-mode): 2.0 cm LEFT ATRIUM             Index        RIGHT ATRIUM           Index LA diam:        3.00 cm 1.88 cm/m   RA Area:     11.80 cm LA Vol (A2C):   56.0 ml 35.01 ml/m  RA Volume:   25.90 ml  16.19 ml/m LA Vol (A4C):   46.8 ml 29.26 ml/m LA Biplane Vol: 51.6 ml 32.26 ml/m  AORTIC VALVE LVOT Vmax:   77.50 cm/s LVOT Vmean:  50.300 cm/s LVOT VTI:    0.178 m AI PHT:      534 msec  AORTA Ao Root diam: 3.00 cm Ao Asc diam:  3.20 cm MITRAL VALVE               TRICUSPID VALVE MV Area (PHT): 3.31 cm    TR Peak grad:   25.6 mmHg MV Decel Time: 229 msec    TR Vmax:        253.00 cm/s MV E velocity: 87.80 cm/s MV A velocity: 62.10 cm/s  SHUNTS MV E/A ratio:  1.41        Systemic VTI:  0.18 m                            Systemic Diam: 1.90 cm Gwyndolyn Kaufman MD Electronically signed  by Gwyndolyn Kaufman MD Signature Date/Time: 06/06/2021/9:19:32 AM    Final

## 2021-06-27 NOTE — Assessment & Plan Note (Signed)
Renal function is stable. Monitor closely She was seen by nephrologist recently 

## 2021-07-04 DIAGNOSIS — C50919 Malignant neoplasm of unspecified site of unspecified female breast: Secondary | ICD-10-CM | POA: Diagnosis not present

## 2021-07-04 DIAGNOSIS — Z1152 Encounter for screening for COVID-19: Secondary | ICD-10-CM | POA: Diagnosis not present

## 2021-07-04 DIAGNOSIS — R5383 Other fatigue: Secondary | ICD-10-CM | POA: Diagnosis not present

## 2021-07-04 DIAGNOSIS — J029 Acute pharyngitis, unspecified: Secondary | ICD-10-CM | POA: Diagnosis not present

## 2021-07-04 DIAGNOSIS — C541 Malignant neoplasm of endometrium: Secondary | ICD-10-CM | POA: Diagnosis not present

## 2021-07-04 DIAGNOSIS — N1832 Chronic kidney disease, stage 3b: Secondary | ICD-10-CM | POA: Diagnosis not present

## 2021-07-04 DIAGNOSIS — R051 Acute cough: Secondary | ICD-10-CM | POA: Diagnosis not present

## 2021-07-04 DIAGNOSIS — C7800 Secondary malignant neoplasm of unspecified lung: Secondary | ICD-10-CM | POA: Diagnosis not present

## 2021-07-04 DIAGNOSIS — U071 COVID-19: Secondary | ICD-10-CM | POA: Diagnosis not present

## 2021-07-04 DIAGNOSIS — G479 Sleep disorder, unspecified: Secondary | ICD-10-CM | POA: Diagnosis not present

## 2021-07-13 ENCOUNTER — Telehealth: Payer: Self-pay

## 2021-07-13 NOTE — Telephone Encounter (Signed)
She called and left a message asking for a return call. She tested positive for covid. Ask her to call the office back.

## 2021-07-17 ENCOUNTER — Encounter: Payer: Self-pay | Admitting: Hematology and Oncology

## 2021-07-17 NOTE — Telephone Encounter (Signed)
Called and left another message asking her to call the office back.  

## 2021-07-17 NOTE — Telephone Encounter (Signed)
Received a message that she tested positive for covid on 12/19. 1/5 appts canceled and sent scheduling message to reschedule on 1/10 and move out 1/23 appts. Left a message asking her to call the office back if needed.

## 2021-07-19 ENCOUNTER — Inpatient Hospital Stay: Payer: Medicare HMO

## 2021-07-20 DIAGNOSIS — H35372 Puckering of macula, left eye: Secondary | ICD-10-CM | POA: Diagnosis not present

## 2021-07-20 DIAGNOSIS — H33011 Retinal detachment with single break, right eye: Secondary | ICD-10-CM | POA: Diagnosis not present

## 2021-07-20 DIAGNOSIS — H31091 Other chorioretinal scars, right eye: Secondary | ICD-10-CM | POA: Diagnosis not present

## 2021-07-20 DIAGNOSIS — H59812 Chorioretinal scars after surgery for detachment, left eye: Secondary | ICD-10-CM | POA: Diagnosis not present

## 2021-07-20 DIAGNOSIS — H338 Other retinal detachments: Secondary | ICD-10-CM | POA: Diagnosis not present

## 2021-07-24 ENCOUNTER — Inpatient Hospital Stay: Payer: Medicare HMO

## 2021-07-24 ENCOUNTER — Inpatient Hospital Stay: Payer: Medicare HMO | Attending: Gynecologic Oncology

## 2021-07-24 ENCOUNTER — Other Ambulatory Visit: Payer: Self-pay

## 2021-07-24 VITALS — BP 150/62 | HR 67 | Temp 97.7°F | Resp 18 | Wt 128.0 lb

## 2021-07-24 DIAGNOSIS — C779 Secondary and unspecified malignant neoplasm of lymph node, unspecified: Secondary | ICD-10-CM | POA: Insufficient documentation

## 2021-07-24 DIAGNOSIS — Z5112 Encounter for antineoplastic immunotherapy: Secondary | ICD-10-CM | POA: Insufficient documentation

## 2021-07-24 DIAGNOSIS — D631 Anemia in chronic kidney disease: Secondary | ICD-10-CM | POA: Insufficient documentation

## 2021-07-24 DIAGNOSIS — C541 Malignant neoplasm of endometrium: Secondary | ICD-10-CM

## 2021-07-24 DIAGNOSIS — Z7982 Long term (current) use of aspirin: Secondary | ICD-10-CM | POA: Insufficient documentation

## 2021-07-24 DIAGNOSIS — Z79899 Other long term (current) drug therapy: Secondary | ICD-10-CM | POA: Diagnosis not present

## 2021-07-24 DIAGNOSIS — C154 Malignant neoplasm of middle third of esophagus: Secondary | ICD-10-CM | POA: Diagnosis not present

## 2021-07-24 DIAGNOSIS — N183 Chronic kidney disease, stage 3 unspecified: Secondary | ICD-10-CM | POA: Diagnosis not present

## 2021-07-24 LAB — COMPREHENSIVE METABOLIC PANEL
ALT: 13 U/L (ref 0–44)
AST: 20 U/L (ref 15–41)
Albumin: 4.2 g/dL (ref 3.5–5.0)
Alkaline Phosphatase: 52 U/L (ref 38–126)
Anion gap: 8 (ref 5–15)
BUN: 23 mg/dL (ref 8–23)
CO2: 27 mmol/L (ref 22–32)
Calcium: 9.8 mg/dL (ref 8.9–10.3)
Chloride: 103 mmol/L (ref 98–111)
Creatinine, Ser: 1.49 mg/dL — ABNORMAL HIGH (ref 0.44–1.00)
GFR, Estimated: 35 mL/min — ABNORMAL LOW (ref >60)
Glucose, Bld: 102 mg/dL — ABNORMAL HIGH (ref 70–99)
Potassium: 3.9 mmol/L (ref 3.5–5.1)
Sodium: 138 mmol/L (ref 135–145)
Total Bilirubin: 0.4 mg/dL (ref 0.3–1.2)
Total Protein: 6.8 g/dL (ref 6.5–8.1)

## 2021-07-24 LAB — CBC WITH DIFFERENTIAL/PLATELET
Abs Immature Granulocytes: 0.03 10*3/uL (ref 0.00–0.07)
Basophils Absolute: 0.1 10*3/uL (ref 0.0–0.1)
Basophils Relative: 1 %
Eosinophils Absolute: 0.2 10*3/uL (ref 0.0–0.5)
Eosinophils Relative: 3 %
HCT: 30.1 % — ABNORMAL LOW (ref 36.0–46.0)
Hemoglobin: 10.5 g/dL — ABNORMAL LOW (ref 12.0–15.0)
Immature Granulocytes: 0 %
Lymphocytes Relative: 19 %
Lymphs Abs: 1.6 10*3/uL (ref 0.7–4.0)
MCH: 31.3 pg (ref 26.0–34.0)
MCHC: 34.9 g/dL (ref 30.0–36.0)
MCV: 89.6 fL (ref 80.0–100.0)
Monocytes Absolute: 0.6 10*3/uL (ref 0.1–1.0)
Monocytes Relative: 7 %
Neutro Abs: 5.9 10*3/uL (ref 1.7–7.7)
Neutrophils Relative %: 70 %
Platelets: 215 10*3/uL (ref 150–400)
RBC: 3.36 MIL/uL — ABNORMAL LOW (ref 3.87–5.11)
RDW: 12.2 % (ref 11.5–15.5)
WBC: 8.3 10*3/uL (ref 4.0–10.5)
nRBC: 0 % (ref 0.0–0.2)

## 2021-07-24 MED ORDER — TRASTUZUMAB-ANNS CHEMO 150 MG IV SOLR
6.0000 mg/kg | Freq: Once | INTRAVENOUS | Status: AC
  Administered 2021-07-24: 378 mg via INTRAVENOUS
  Filled 2021-07-24: qty 18

## 2021-07-24 MED ORDER — DIPHENHYDRAMINE HCL 25 MG PO CAPS
50.0000 mg | ORAL_CAPSULE | Freq: Once | ORAL | Status: AC
  Administered 2021-07-24: 50 mg via ORAL
  Filled 2021-07-24: qty 2

## 2021-07-24 MED ORDER — HEPARIN SOD (PORK) LOCK FLUSH 100 UNIT/ML IV SOLN
500.0000 [IU] | Freq: Once | INTRAVENOUS | Status: AC | PRN
  Administered 2021-07-24: 500 [IU]

## 2021-07-24 MED ORDER — ACETAMINOPHEN 325 MG PO TABS
650.0000 mg | ORAL_TABLET | Freq: Once | ORAL | Status: AC
  Administered 2021-07-24: 650 mg via ORAL
  Filled 2021-07-24: qty 2

## 2021-07-24 MED ORDER — SODIUM CHLORIDE 0.9 % IV SOLN: Freq: Once | INTRAVENOUS | Status: AC

## 2021-07-24 MED ORDER — SODIUM CHLORIDE 0.9% FLUSH
10.0000 mL | INTRAVENOUS | Status: DC | PRN
  Administered 2021-07-24: 10 mL

## 2021-07-24 MED ORDER — SODIUM CHLORIDE 0.9% FLUSH
10.0000 mL | Freq: Once | INTRAVENOUS | Status: AC
  Administered 2021-07-24: 10 mL

## 2021-07-24 NOTE — Patient Instructions (Signed)
[facility]  Discharge Instructions: Thank you for choosing [facility] to provide your oncology and hematology care.   If you have a lab appointment with the Cancer Center, please go directly to the Cancer Center and check in at the registration area.   Wear comfortable clothing and clothing appropriate for easy access to any Portacath or PICC line.   We strive to give you quality time with your provider. You may need to reschedule your appointment if you arrive late (15 or more minutes).  Arriving late affects you and other patients whose appointments are after yours.  Also, if you miss three or more appointments without notifying the office, you may be dismissed from the clinic at the provider's discretion.      For prescription refill requests, have your pharmacy contact our office and allow 72 hours for refills to be completed.    Today you received the following chemotherapy and/or immunotherapy agents: keytruda      To help prevent nausea and vomiting after your treatment, we encourage you to take your nausea medication as directed.  BELOW ARE SYMPTOMS THAT SHOULD BE REPORTED IMMEDIATELY: *FEVER GREATER THAN 100.4 F (38 C) OR HIGHER *CHILLS OR SWEATING *NAUSEA AND VOMITING THAT IS NOT CONTROLLED WITH YOUR NAUSEA MEDICATION *UNUSUAL SHORTNESS OF BREATH *UNUSUAL BRUISING OR BLEEDING *URINARY PROBLEMS (pain or burning when urinating, or frequent urination) *BOWEL PROBLEMS (unusual diarrhea, constipation, pain near the anus) TENDERNESS IN MOUTH AND THROAT WITH OR WITHOUT PRESENCE OF ULCERS (sore throat, sores in mouth, or a toothache) UNUSUAL RASH, SWELLING OR PAIN  UNUSUAL VAGINAL DISCHARGE OR ITCHING   Items with * indicate a potential emergency and should be followed up as soon as possible or go to the Emergency Department if any problems should occur.  Please show the CHEMOTHERAPY ALERT CARD or IMMUNOTHERAPY ALERT CARD at check-in to  the Emergency Department and triage nurse.  Should you have questions after your visit or need to cancel or reschedule your appointment, please contact [facility]  Dept: [phone]  and follow the prompts.  Office hours are 8:00 a.m. to 4:30 p.m. Monday - Friday. Please note that voicemails left after 4:00 p.m. may not be returned until the following business day.  We are closed weekends and major holidays. You have access to a nurse at all times for urgent questions. Please call the main number to the clinic Dept: [phone] and follow the prompts.   For any non-urgent questions, you may also contact your provider using MyChart. We now offer e-Visits for anyone 18 and older to request care online for non-urgent symptoms. For details visit [email or web address].   Also download the MyChart app! Go to the app store, search "MyChart", open the app, select [facility], and log in with your MyChart username and password.  Due to Covid, a mask is required upon entering the hospital/clinic. If you do not have a mask, one will be given to you upon arrival. For doctor visits, patients may have 1 support person aged 18 or older with them. For treatment visits, patients cannot have anyone with them due to current Covid guidelines and our immunocompromised population.   

## 2021-08-06 ENCOUNTER — Inpatient Hospital Stay: Payer: Medicare HMO

## 2021-08-06 ENCOUNTER — Inpatient Hospital Stay: Payer: Medicare HMO | Admitting: Hematology and Oncology

## 2021-08-07 DIAGNOSIS — E785 Hyperlipidemia, unspecified: Secondary | ICD-10-CM | POA: Diagnosis not present

## 2021-08-07 DIAGNOSIS — R739 Hyperglycemia, unspecified: Secondary | ICD-10-CM | POA: Diagnosis not present

## 2021-08-10 DIAGNOSIS — K219 Gastro-esophageal reflux disease without esophagitis: Secondary | ICD-10-CM | POA: Diagnosis not present

## 2021-08-10 DIAGNOSIS — E785 Hyperlipidemia, unspecified: Secondary | ICD-10-CM | POA: Diagnosis not present

## 2021-08-10 DIAGNOSIS — M8580 Other specified disorders of bone density and structure, unspecified site: Secondary | ICD-10-CM | POA: Diagnosis not present

## 2021-08-10 DIAGNOSIS — E039 Hypothyroidism, unspecified: Secondary | ICD-10-CM | POA: Diagnosis not present

## 2021-08-10 DIAGNOSIS — R739 Hyperglycemia, unspecified: Secondary | ICD-10-CM | POA: Diagnosis not present

## 2021-08-10 DIAGNOSIS — I10 Essential (primary) hypertension: Secondary | ICD-10-CM | POA: Diagnosis not present

## 2021-08-10 DIAGNOSIS — N1832 Chronic kidney disease, stage 3b: Secondary | ICD-10-CM | POA: Diagnosis not present

## 2021-08-10 DIAGNOSIS — R270 Ataxia, unspecified: Secondary | ICD-10-CM | POA: Diagnosis not present

## 2021-08-12 DIAGNOSIS — E039 Hypothyroidism, unspecified: Secondary | ICD-10-CM | POA: Diagnosis not present

## 2021-08-12 DIAGNOSIS — I10 Essential (primary) hypertension: Secondary | ICD-10-CM | POA: Diagnosis not present

## 2021-08-12 DIAGNOSIS — E785 Hyperlipidemia, unspecified: Secondary | ICD-10-CM | POA: Diagnosis not present

## 2021-08-12 DIAGNOSIS — M1990 Unspecified osteoarthritis, unspecified site: Secondary | ICD-10-CM | POA: Diagnosis not present

## 2021-08-13 ENCOUNTER — Telehealth: Payer: Self-pay

## 2021-08-13 ENCOUNTER — Inpatient Hospital Stay: Payer: Medicare HMO

## 2021-08-13 ENCOUNTER — Encounter: Payer: Self-pay | Admitting: Hematology and Oncology

## 2021-08-13 ENCOUNTER — Inpatient Hospital Stay: Payer: Medicare HMO | Admitting: Hematology and Oncology

## 2021-08-13 ENCOUNTER — Other Ambulatory Visit: Payer: Self-pay

## 2021-08-13 VITALS — BP 148/67 | HR 69 | Temp 98.1°F | Resp 18 | Ht 62.0 in | Wt 128.4 lb

## 2021-08-13 DIAGNOSIS — N183 Chronic kidney disease, stage 3 unspecified: Secondary | ICD-10-CM | POA: Diagnosis not present

## 2021-08-13 DIAGNOSIS — I10 Essential (primary) hypertension: Secondary | ICD-10-CM | POA: Diagnosis not present

## 2021-08-13 DIAGNOSIS — C541 Malignant neoplasm of endometrium: Secondary | ICD-10-CM

## 2021-08-13 DIAGNOSIS — C154 Malignant neoplasm of middle third of esophagus: Secondary | ICD-10-CM | POA: Diagnosis not present

## 2021-08-13 DIAGNOSIS — Z5112 Encounter for antineoplastic immunotherapy: Secondary | ICD-10-CM | POA: Diagnosis not present

## 2021-08-13 DIAGNOSIS — D630 Anemia in neoplastic disease: Secondary | ICD-10-CM | POA: Diagnosis not present

## 2021-08-13 DIAGNOSIS — Z7982 Long term (current) use of aspirin: Secondary | ICD-10-CM | POA: Diagnosis not present

## 2021-08-13 DIAGNOSIS — C779 Secondary and unspecified malignant neoplasm of lymph node, unspecified: Secondary | ICD-10-CM | POA: Diagnosis not present

## 2021-08-13 DIAGNOSIS — Z79899 Other long term (current) drug therapy: Secondary | ICD-10-CM | POA: Diagnosis not present

## 2021-08-13 DIAGNOSIS — D631 Anemia in chronic kidney disease: Secondary | ICD-10-CM | POA: Diagnosis not present

## 2021-08-13 LAB — CBC WITH DIFFERENTIAL/PLATELET
Abs Immature Granulocytes: 0.02 10*3/uL (ref 0.00–0.07)
Basophils Absolute: 0.1 10*3/uL (ref 0.0–0.1)
Basophils Relative: 2 %
Eosinophils Absolute: 0.1 10*3/uL (ref 0.0–0.5)
Eosinophils Relative: 3 %
HCT: 29.9 % — ABNORMAL LOW (ref 36.0–46.0)
Hemoglobin: 10.5 g/dL — ABNORMAL LOW (ref 12.0–15.0)
Immature Granulocytes: 0 %
Lymphocytes Relative: 36 %
Lymphs Abs: 1.8 10*3/uL (ref 0.7–4.0)
MCH: 31.2 pg (ref 26.0–34.0)
MCHC: 35.1 g/dL (ref 30.0–36.0)
MCV: 88.7 fL (ref 80.0–100.0)
Monocytes Absolute: 0.6 10*3/uL (ref 0.1–1.0)
Monocytes Relative: 11 %
Neutro Abs: 2.4 10*3/uL (ref 1.7–7.7)
Neutrophils Relative %: 48 %
Platelets: 244 10*3/uL (ref 150–400)
RBC: 3.37 MIL/uL — ABNORMAL LOW (ref 3.87–5.11)
RDW: 12.7 % (ref 11.5–15.5)
WBC: 4.9 10*3/uL (ref 4.0–10.5)
nRBC: 0 % (ref 0.0–0.2)

## 2021-08-13 LAB — COMPREHENSIVE METABOLIC PANEL
ALT: 12 U/L (ref 0–44)
AST: 20 U/L (ref 15–41)
Albumin: 4.2 g/dL (ref 3.5–5.0)
Alkaline Phosphatase: 59 U/L (ref 38–126)
Anion gap: 9 (ref 5–15)
BUN: 23 mg/dL (ref 8–23)
CO2: 27 mmol/L (ref 22–32)
Calcium: 9.1 mg/dL (ref 8.9–10.3)
Chloride: 101 mmol/L (ref 98–111)
Creatinine, Ser: 1.27 mg/dL — ABNORMAL HIGH (ref 0.44–1.00)
GFR, Estimated: 42 mL/min — ABNORMAL LOW (ref >60)
Glucose, Bld: 96 mg/dL (ref 70–99)
Potassium: 3.5 mmol/L (ref 3.5–5.1)
Sodium: 137 mmol/L (ref 135–145)
Total Bilirubin: 0.4 mg/dL (ref 0.3–1.2)
Total Protein: 6.9 g/dL (ref 6.5–8.1)

## 2021-08-13 MED ORDER — SODIUM CHLORIDE 0.9% FLUSH
10.0000 mL | INTRAVENOUS | Status: DC | PRN
  Administered 2021-08-13: 10 mL

## 2021-08-13 MED ORDER — TRASTUZUMAB-ANNS CHEMO 150 MG IV SOLR
6.0000 mg/kg | Freq: Once | INTRAVENOUS | Status: AC
  Administered 2021-08-13: 378 mg via INTRAVENOUS
  Filled 2021-08-13: qty 18

## 2021-08-13 MED ORDER — SODIUM CHLORIDE 0.9 % IV SOLN: Freq: Once | INTRAVENOUS | Status: AC

## 2021-08-13 MED ORDER — SODIUM CHLORIDE 0.9% FLUSH
10.0000 mL | Freq: Once | INTRAVENOUS | Status: AC
  Administered 2021-08-13: 10 mL

## 2021-08-13 MED ORDER — DIPHENHYDRAMINE HCL 25 MG PO CAPS
25.0000 mg | ORAL_CAPSULE | Freq: Once | ORAL | Status: AC
  Administered 2021-08-13: 25 mg via ORAL
  Filled 2021-08-13: qty 1

## 2021-08-13 MED ORDER — ACETAMINOPHEN 325 MG PO TABS
650.0000 mg | ORAL_TABLET | Freq: Once | ORAL | Status: AC
  Administered 2021-08-13: 650 mg via ORAL
  Filled 2021-08-13: qty 2

## 2021-08-13 MED ORDER — HEPARIN SOD (PORK) LOCK FLUSH 100 UNIT/ML IV SOLN
500.0000 [IU] | Freq: Once | INTRAVENOUS | Status: AC | PRN
  Administered 2021-08-13: 500 [IU]

## 2021-08-13 NOTE — Assessment & Plan Note (Signed)
She tolerated single agent trastuzumab well She has no signs of cancer recurrence Her next CT imaging will be in 6 months, March 2023 I plan to continue echocardiogram monitoring every 3 months, next due in Feb 2023

## 2021-08-13 NOTE — Telephone Encounter (Signed)
Called and left a message. Ask her to call the office if needed. Echo scheduled 2/17 at 10 am at [facility], arrive at [facility].

## 2021-08-13 NOTE — Patient Instructions (Signed)
[facility]  Discharge Instructions: Thank you for choosing [facility] to provide your oncology and hematology care.   If you have a lab appointment with the Cancer Center, please go directly to the Cancer Center and check in at the registration area.   Wear comfortable clothing and clothing appropriate for easy access to any Portacath or PICC line.   We strive to give you quality time with your provider. You may need to reschedule your appointment if you arrive late (15 or more minutes).  Arriving late affects you and other patients whose appointments are after yours.  Also, if you miss three or more appointments without notifying the office, you may be dismissed from the clinic at the provider's discretion.      For prescription refill requests, have your pharmacy contact our office and allow 72 hours for refills to be completed.    Today you received the following chemotherapy and/or immunotherapy agents Rituxan      To help prevent nausea and vomiting after your treatment, we encourage you to take your nausea medication as directed.  BELOW ARE SYMPTOMS THAT SHOULD BE REPORTED IMMEDIATELY: *FEVER GREATER THAN 100.4 F (38 C) OR HIGHER *CHILLS OR SWEATING *NAUSEA AND VOMITING THAT IS NOT CONTROLLED WITH YOUR NAUSEA MEDICATION *UNUSUAL SHORTNESS OF BREATH *UNUSUAL BRUISING OR BLEEDING *URINARY PROBLEMS (pain or burning when urinating, or frequent urination) *BOWEL PROBLEMS (unusual diarrhea, constipation, pain near the anus) TENDERNESS IN MOUTH AND THROAT WITH OR WITHOUT PRESENCE OF ULCERS (sore throat, sores in mouth, or a toothache) UNUSUAL RASH, SWELLING OR PAIN  UNUSUAL VAGINAL DISCHARGE OR ITCHING   Items with * indicate a potential emergency and should be followed up as soon as possible or go to the Emergency Department if any problems should occur.  Please show the CHEMOTHERAPY ALERT CARD or IMMUNOTHERAPY ALERT CARD at check-in to the  Emergency Department and triage nurse.  Should you have questions after your visit or need to cancel or reschedule your appointment, please contact [facility]  Dept: [phone]  and follow the prompts.  Office hours are 8:00 a.m. to 4:30 p.m. Monday - Friday. Please note that voicemails left after 4:00 p.m. may not be returned until the following business day.  We are closed weekends and major holidays. You have access to a nurse at all times for urgent questions. Please call the main number to the clinic Dept: [phone] and follow the prompts.   For any non-urgent questions, you may also contact your provider using MyChart. We now offer e-Visits for anyone 18 and older to request care online for non-urgent symptoms. For details visit [email or web address].   Also download the MyChart app! Go to the app store, search "MyChart", open the app, select [facility], and log in with your MyChart username and password.  Due to Covid, a mask is required upon entering the hospital/clinic. If you do not have a mask, one will be given to you upon arrival. For doctor visits, patients may have 1 support person aged 18 or older with them. For treatment visits, patients cannot have anyone with them due to current Covid guidelines and our immunocompromised population.   

## 2021-08-13 NOTE — Assessment & Plan Note (Signed)
Renal function is stable. Monitor closely She was seen by nephrologist recently 

## 2021-08-13 NOTE — Assessment & Plan Note (Signed)
She is noted to have consistent high blood pressure She will continue medical management and risk factor modifications I will order echocardiogram for next month for further follow-up

## 2021-08-13 NOTE — Progress Notes (Signed)
[facility]  Patient Care Team: Burnard Bunting, [name]  ASSESSMENT & PLAN:  Endometrial cancer [facility]) She tolerated single agent trastuzumab well She has no signs of cancer recurrence Her next CT imaging will be in 6 months, March 2023 I plan to continue echocardiogram monitoring every 3 months, next due in Feb 2023  CKD (chronic kidney disease), stage III ([place]) Renal function is stable. Monitor closely She was seen by nephrologist recently  Anemia in neoplastic disease She has multifactorial anemia, anemia secondary to prior chemotherapy as well as chronic kidney disease Anemia is improving/stable Recent vitamin B12 level was adequate She is not symptomatic Observe only  Essential hypertension She is noted to have consistent high blood pressure She will continue medical management and risk factor modifications I will order echocardiogram for next month for further follow-up  Orders Placed This Encounter  Procedures   CT CHEST ABDOMEN PELVIS W CONTRAST    Standing Status:   Future    Standing Expiration Date:   08/13/2022    Order Specific Question:   Preferred imaging location?    Answer:   [facility]    Order Specific Question:   Radiology Contrast Protocol - do NOT remove file path    Answer:   [email or web address]   ECHOCARDIOGRAM COMPLETE    Standing Status:   Future    Standing Expiration Date:   08/13/2022    Order Specific Question:   Where should this test be performed    Answer:   [facility]    Order Specific Question:   Perflutren DEFINITY (image enhancing agent) should be administered unless hypersensitivity or allergy exist    Answer:   Administer Perflutren    Order Specific Question:   Reason for exam-Echo    Answer:   Chemo  [id number]    All questions were answered. The patient knows to call the clinic with any problems, questions or  concerns. The total time spent in the appointment was 30 minutes encounter with patients including review of chart and various tests results, discussions about plan of care and coordination of care plan   [name], MD 08/13/2021 1:17 PM  INTERVAL HISTORY: Please see below for problem oriented charting. she returns for treatment follow-up She tolerated treatment well No new side effects Denies shortness of breath, ankle edema or lymphadenopathy  REVIEW OF SYSTEMS:   Constitutional: Denies fevers, chills or abnormal weight loss Eyes: Denies blurriness of vision Ears, nose, mouth, throat, and face: Denies mucositis or sore throat Respiratory: Denies cough, dyspnea or wheezes Cardiovascular: Denies palpitation, chest discomfort or lower extremity swelling Gastrointestinal:  Denies nausea, heartburn or change in bowel habits Skin: Denies abnormal skin rashes Lymphatics: Denies new lymphadenopathy or easy bruising Neurological:Denies numbness, tingling or new weaknesses Behavioral/Psych: Mood is stable, no new changes  All other systems were reviewed with the patient and are negative.  I have reviewed the past medical history, past surgical history, social history and family history with the patient and they are unchanged from previous note.  ALLERGIES:  has No Known Allergies.  MEDICATIONS:  Current Outpatient Medications  Medication Sig Dispense Refill   acetaminophen (TYLENOL) 500 MG tablet Take 1,000 mg by mouth every 6 (six) hours as needed.     aspirin EC 81 MG tablet Take 81 mg by mouth daily.     atorvastatin (LIPITOR) 20 MG tablet Take 20 mg by mouth every evening.  Calcium Carbonate-Vitamin D (CALCIUM-VITAMIN D) 500-200 MG-UNIT per tablet Take 1 tablet by mouth daily.     citalopram (CELEXA) 20 MG tablet Take 20 mg by mouth at bedtime.      estradiol (ESTRACE) 0.1 MG/GM vaginal cream PLACE 1 APPLICATORFUL VAGINALLY 3 TIMES A WEEK 42.5 g 12   gabapentin (NEURONTIN) 600  MG tablet Take 600 mg by mouth at bedtime as needed.     levothyroxine (SYNTHROID, LEVOTHROID) 100 MCG tablet Take 100 mcg by mouth daily before breakfast.      lidocaine-prilocaine (EMLA) cream Apply to affected area once 30 g 3   losartan-hydrochlorothiazide (HYZAAR) 100-25 MG tablet      magnesium oxide (MAG-OX) 400 (241.3 Mg) MG tablet Take 1 tablet (400 mg total) by mouth daily. 30 tablet 11   metoprolol succinate (TOPROL-XL) 25 MG 24 hr tablet Take 25 mg by mouth daily.     Multiple Vitamin (MULTIVITAMIN WITH MINERALS) TABS Take 1 tablet by mouth daily.     ondansetron (ZOFRAN) 8 MG tablet Take 1 tablet (8 mg total) by mouth every 8 (eight) hours as needed. Start on the third day after chemotherapy. 30 tablet 1   valACYclovir (VALTREX) 1000 MG tablet valacyclovir 1 gram tablet     No current facility-administered medications for this visit.   Facility-Administered Medications Ordered in Other Visits  Medication Dose Route Frequency Provider Last Rate Last Admin   sodium chloride flush (NS) 0.9 % injection 10 mL  10 mL Intracatheter PRN Alvy Bimler, Daivik Overley, MD   10 mL at 08/13/21 1140    SUMMARY OF ONCOLOGIC HISTORY: Oncology History Overview Note  CALIANA SPIRES  has a remote history of left  breast cancer at age 31 but received BRCA testing approximately 5 years ago which was negative. Her cancer was treated with surgery but no radiation or chemotherapy.   Serous endometrial cancer, MSI stable ER positive, PR neg, Her2/neu 3+      Endometrial cancer ([place])  08/29/2016 Pathology Results   Endometrium, biopsy - HIGH GRADE ENDOMETRIAL CARCINOMA, SEE COMMENT. Microscopic Comment The sections show multiple fragments of adenocarcinoma displaying glandular and papillary patterns associated with high grade cytomorphology characterized by nuclear pleomorphism, prominent nucleoli and brisk mitosis. Immunohistochemical stains show that the tumor cells are positive for vimentin, p16, p53 with  increased Ki-67 expression. Estrogen and progesterone receptor stains show patchy weak positivity. No significant positivity is seen with CEA. The findings are consistent with high grade endometrial carcinoma and the overall morphology and phenotypic features favor serous carcinoma.   08/29/2016 Initial Diagnosis   She presented with postmenopausal bleeding   10/03/2016 Imaging   CT C/A/P 09/2016 IMPRESSION: 1. Thickening of the endometrial canal up to 19 mm in fundus, presumably corresponding to the patient's reported endometrial carcinoma. 2. Multiple tiny pulmonary nodules scattered throughout the lungs bilaterally measuring 4 mm or less in size. Nodules of this size are typically considered statistically likely benign. In the setting of known primary malignancy, metastatic disease to the lungs is not excluded, but is not strongly favored on today's examination. Attention on followup studies is recommended to ensure the stability or resolution of these nodules. 3. Subcentimeter low-attenuation lesion in the central aspect of segment 8 of the liver is too small to characterize. This is statistically likely a tiny cyst, but warrants attention on follow-up studies to exclude the possibility of a solitary hepatic metastasis. 4. 1.5 x 1.5 x 1.7 cm well-circumscribed lesion in the proximal stomach. This is of uncertain etiology  and significance, and could represent a benign gastric polyp, however, further evaluation with nonemergent endoscopy is suggested in the near future for further evaluation. 5. **An incidental finding of potential clinical significance has been found. 1.1 x 1.6 cm thyroid nodule in the inferior aspect of the right lobe of the thyroid gland. Follow-up evaluation with nonemergent thyroid ultrasound is recommended in the near future to better evaluate this finding. This recommendation follows ACR consensuss guidelines: Managing Incidental Thyroid Nodules Detected on Imaging: White Paper of  the ACR Incidental Thyroid Findings Committee. J Am Coll Radiol 2015;12(2):143-150.** 6. Aortic atherosclerosis, in addition to left anterior descending coronary artery disease   10/22/2016 Surgery   Robotic assisted total hysterectomy, BSO and bilateral pelvic lymphadenectomy  Final pathology revealed a 3cm polyp containing serous carcinoma but with no myometrial invasion, no LVSI and negative nodes.  Stage IA Uterine serous cancer   10/22/2016 Pathology Results   1. Lymph nodes, regional resection, right pelvic - SIX BENIGN LYMPH NODES (0/6). 2. Lymph nodes, regional resection, left pelvic - SEVEN BENIGN LYMPH NODES (0/7). 3. Uterus +/- tubes/ovaries, neoplastic, with right ovary and fallopian tube ENDOMYOMETRIUM - SEROUS CARCINOMA ARISING WITHIN AN ENDOMETRIAL POLYP - NO MYOMETRIAL INVASION IDENTIFIED - ADENOMYOSIS - LEIOMYOMA (1 CM) - SEE ONCOLOGY TABLE AND COMMENT CERVIX - CARCINOMA FOCALLY INVOLVES ENDOCERVICAL GLANDS - NABOTHIAN CYSTS RIGHT ADNEXA - BENIGN OVARY AND FALLOPIAN TUBE - NO CARCINOMA IDENTIFIED 4. Cul-de-sac biopsy - MESOTHELIAL HYPERPLASIA Microscopic Comment 3. ONCOLOGY TABLE-UTERUS, CARCINOMA OR CARCINOSARCOMA Specimen: Uterus, right fallopian tube and ovary Procedure: Total hysterectomy and right salpingo-oophorectomy Lymph node sampling performed: Bilateral pelvic regional resection Specimen integrity: Intact Maximum tumor size: 3 cm (polyp) Histologic type: Serous carcinoma Grade: High grade Myometrial invasion: Not identified Cervical stromal involvement: No, focal endocervical gland involvement Extent of involvement of other organs: Not identified Lymph - vascular invasion: Not identified Peritoneal washings: N/A Lymph nodes: Examined: 0 Sentinel 13 Non-sentinel 13 Total Lymph nodes with metastasis: 0 Isolated tumor cells (< 0.2 mm): 0 Micrometastasis: (> 0.2 mm and < 2.0 mm): 0 Macrometastasis: (> 2.0 mm): 0 Extracapsular extension:  N/A Pelvic lymph nodes: 0 involved of 13 lymph nodes. Para-aortic lymph nodes: No para-aortic nodes submitted TNM code: pT1a, pNX FIGO Stage (based on pathologic findings, needs clinical correlation): IA Comment: Immunohistochemistry for cytokeratin AE1/AE3 is performed on all of the lymph nodes (parts 1 & 2) and no metastatic carcinoma is identified.   07/04/2017 Genetic Testing   The patient had genetic testing due to a personal history of breast and uterine cancer, and a family history of stomach cancer.  The Multi-Cancer Panel was ordered. The Multi-Cancer Panel offered by [facility] includes sequencing and/or deletion duplication testing of the following 83 genes: ALK, APC, ATM, AXIN2,BAP1,  BARD1, BLM, BMPR1A, BRCA1, BRCA2, BRIP1, CASR, CDC73, CDH1, CDK4, CDKN1B, CDKN1C, CDKN2A (p14ARF), CDKN2A (p16INK4a), CEBPA, CHEK2, CTNNA1, DICER1, DIS3L2, EGFR (c.2369C>T, p.Thr790Met variant only), EPCAM (Deletion/duplication testing only), FH, FLCN, GATA2, GPC3, GREM1 (Promoter region deletion/duplication testing only), HOXB13 (c.251G>A, p.Gly84Glu), HRAS, KIT, MAX, MEN1, MET, MITF (c.952G>A, p.Glu318Lys variant only), MLH1, MSH2, MSH3, MSH6, MUTYH, NBN, NF1, NF2, NTHL1, PALB2, PDGFRA, PHOX2B, PMS2, POLD1, POLE, POT1, PRKAR1A, PTCH1, PTEN, RAD50, RAD51C, RAD51D, RB1, RECQL4, RET, RUNX1, SDHAF2, SDHA (sequence changes only), SDHB, SDHC, SDHD, SMAD4, SMARCA4, SMARCB1, SMARCE1, STK11, SUFU, TERC, TERT, TMEM127, TP53, TSC1, TSC2, VHL, WRN and WT1.   Results: No pathogenic variants were identified.  A variant of uncertain significance in the gene APC was identified.  c.791A>G (p.Gln264Arg).  The  date of this test report is 07/04/2017.     Genetic Testing   Patient has genetic testing done for MSI. 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    12/03/2017 Imaging   CT Chest/Abd/Pelvis to follow pulmonary nodule and gastric mass IMPRESSION: 1. Stable CT of the chest. Small  pulmonary nodules are unchanged when compared with previous exam. 2. No new findings identified. 3. Subcentimeter low-attenuation lesions within the liver are remain too small to characterize but are stable from prior exam. 4. Persistent indeterminate low-attenuation structure within the proximal stomach is unchanged measuring 1.4 cm. Correlation with direct visualization is advised   06/30/2018 Imaging   CT CHEST Lungs/Pleura: Stable scattered sub-cm pulmonary nodules are again seen bilaterally and are stable compared to previous studies. No new or enlarging pulmonary nodules or masses identified. No evidence of pulmonary infiltrate or pleural effusion.   08/19/2018 Echocardiogram   E[place] is done in [place]: EF 60-65%. Mild impaired relaxation. (report scanned)   09/17/2018 Relapse/Recurrence   Presented with c/o vagina discharge.  Lesion noted at the left vaginal apex 63m lesion removed Path c/w high grade serous cancer   09/17/2018 Pathology Results   Vagina, biopsy, left apex - HIGH GRADE SEROUS CARCINOMA.   09/28/2018 PET scan   1. Two hypermetabolic axial lymph nodes. Unusual site for metastatic endometrial carcinoma however the activity is more intense than typically seen in reactive adenopathy. Suggest ultrasound-guided percutaneous biopsy of the larger RIGHT axial lymph node 2. No evidence of local recurrence at the vaginal cuff.  3. No metastatic adenopathy in the abdomen or pelvis. 4. Stable small pulmonary nodules   10/09/2018 Pathology Results   Lymph node, needle/core biopsy, right axilla - METASTATIC CARCINOMA, SEE COMMENT. Microscopic Comment The carcinoma appears high grade. Immunohistochemistry is positive for cytokeratin 7, PAX-8, and ER. Cytokeratin 20, CDX-2, PR, and GATA-3 are negative. The findings along with the history are consistent with a gynecologic primary.    10/09/2018 Procedure   Ultrasound-guided core biopsies of a right axillary lymph node.   10/14/2018 Cancer  Staging   Staging form: Corpus Uteri - Carcinoma and Carcinosarcoma, AJCC 8th Edition - Clinical: Stage IVB (cT1, cN0, pM1) - Signed by GHeath Lark MD on 10/14/2018    10/19/2018 Imaging   Placement of single lumen port a cath via right internal jugular vein. The catheter tip lies at the cavo-atrial junction. A power injectable port a cath was placed and is ready for immediate use.    10/21/2018 -  Chemotherapy   The patient had carboplatin and taxol for treatment   11/11/2018 -  Chemotherapy   Patient is on Treatment Plan : BREAST Trastuzumab q21d     12/21/2018 PET scan   1. Interval resolution of hypermetabolic right axillary lymph nodes. No metabolic findings highly suspicious for recurrent metastatic disease. 2. New mild hypermetabolism within a borderline prominent portacaval lymph node, nonspecific. While a reactive node is favored, a lymph node metastasis cannot be entirely excluded. Suggest attention to this lymph node on follow-up PET-CT in 3-6 months. 3. Nonspecific new hypermetabolism at the ileocecal valve, more likely physiologic given absence of CT correlate. 4. Scattered subcentimeter pulmonary nodules are all stable and below PET resolution, more likely benign, continued CT surveillance advised. 5. Chronic findings include: Aortic Atherosclerosis (ICD10-I70.0). Marked diffuse colonic diverticulosis. Coronary atherosclerosis.     12/28/2018 Echocardiogram   1. The left ventricle has normal systolic function with an ejection fraction of 60-65%. The cavity size was  normal. Left ventricular diastolic Doppler parameters are consistent with impaired relaxation.  2. GLS recorded as -10.7 but LV appears hyperdynamic and tracking of endocardium appears poor.  3. The right ventricle has normal systolic function. The cavity was normal. There is no increase in right ventricular wall thickness.  4. Mild thickening of the mitral valve leaflet.  5. The aortic valve was not well visualized. Mild  thickening of the aortic valve. Aortic valve regurgitation is trivial by color flow Doppler.   03/23/2019 Imaging   PET 1. No findings of hypermetabolic residual/recurrent or metastatic disease. 2. Similar low-level hypermetabolism within normal sized portocaval and right inguinal nodes, favored to be reactive. 3. Ongoing stability of small bilateral pulmonary nodules, favored to be benign. Below PET resolution. 4. Coronary artery atherosclerosis. Aortic Atherosclerosis   03/26/2019 Echocardiogram   1. The left ventricle has normal systolic function, with an ejection fraction of 55-60%. The cavity size was normal. Left ventricular diastolic Doppler parameters are consistent with impaired relaxation.  2. The right ventricle has normal systolic function. The cavity was normal.  3. The mitral valve is abnormal. Mild thickening of the mitral valve leaflet. There is mild mitral annular calcification present.  4. The tricuspid valve is grossly normal.  5. The aortic valve is tricuspid. Mild calcification of the aortic valve. No stenosis of the aortic valve.  6. The aorta is normal unless otherwise noted.  7. Normal LV systolic function; grade 1 diastolic dysfunction; UQJ-33.5%.   06/30/2019 Imaging   Chest Impression:   1. No evidence thoracic metastasis. 2. Stable small benign-appearing pulmonary nodules   Abdomen / Pelvis Impression:   1. No evidence of metastatic disease in the abdomen pelvis. 2.  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 3.  Aortic Atherosclerosis (ICD10-I70.0).   06/30/2019 Echocardiogram    1. Left ventricular ejection fraction, by visual estimation, is 60 to 65%. The left ventricle has normal function. There is no left ventricular hypertrophy.  2. Left ventricular diastolic parameters are consistent with Grade I diastolic dysfunction (impaired relaxation).  3. The left ventricle has no regional wall motion abnormalities.  4. Global right ventricle has  normal systolic function.The right ventricular size is normal. No increase in right ventricular wall thickness.  5. Left atrial size was mild-moderately dilated.  6. Right atrial size was normal.  7. The mitral valve is normal in structure. Trivial mitral valve regurgitation.  8. The tricuspid valve is normal in structure. Tricuspid valve regurgitation is trivial.  9. The aortic valve is normal in structure. Aortic valve regurgitation is trivial. No evidence of aortic valve sclerosis or stenosis. 10. The pulmonic valve was grossly normal. Pulmonic valve regurgitation is not visualized. 11. Mildly elevated pulmonary artery systolic pressure. 12. The inferior vena cava is normal in size with greater than 50% respiratory variability, suggesting right atrial pressure of 3 mmHg. 13. The average left ventricular global longitudinal strain is -12.5 %. GLS underestimated due to poor endocardial tracking.     09/27/2019 Imaging   1. Multiple small, nonspecific pulmonary nodules are again noted. The previously noted lung nodules are unchanged in size from previous exam. There is a new lung nodule within the medial right lower lobe which is nonspecific measure 4 mm. Attention at follow-up imaging is recommended. 2. No evidence of metastatic disease within the abdomen or pelvis. 3. Coronary artery calcifications.  The 4.  Aortic Atherosclerosis (ICD10-I70.0).   09/30/2019 Echocardiogram    1. Left ventricular ejection fraction, by estimation, is 60 to  65%. The left ventricle has normal function. The left ventricle has no regional wall motion abnormalities. Left ventricular diastolic parameters are consistent with Grade I diastolic dysfunction (impaired relaxation).  2. Right ventricular systolic function is normal. The right ventricular size is normal. There is normal pulmonary artery systolic pressure. The estimated right ventricular systolic pressure is 16.9 mmHg.  3. The mitral valve is normal in  structure. No evidence of mitral valve regurgitation. No evidence of mitral stenosis.  4. The aortic valve is normal in structure. Aortic valve regurgitation is trivial. No aortic stenosis is present.  5. The inferior vena cava is normal in size with greater than 50% respiratory variability, suggesting right atrial pressure of 3 mmHg.     01/07/2020 Echocardiogram    1. Normal LV function; grade 1 diastolic dysfunction; mild AI; GLS -21%.  2. Left ventricular ejection fraction, by estimation, is 55 to 60%. The left ventricle has normal function. The left ventricle has no regional wall motion abnormalities. Left ventricular diastolic parameters are consistent with Grade I diastolic dysfunction (impaired relaxation).  3. Right ventricular systolic function is normal. The right ventricular size is normal.  4. The mitral valve is normal in structure. Trivial mitral valve regurgitation. No evidence of mitral stenosis.  5. The aortic valve is tricuspid. Aortic valve regurgitation is mild. Mild aortic valve sclerosis is present, with no evidence of aortic valve stenosis.  6. The inferior vena cava is normal in size with greater than 50% respiratory variability, suggesting right atrial pressure of 3 mmHg.     03/27/2020 Imaging   1. Status post hysterectomy. No evidence of recurrent or metastatic disease in the abdomen or pelvis. 2. Multiple small pulmonary nodules in the lung bases are unchanged to the extent that they are included on current examination and remain most likely sequelae of prior infection or inflammation. Attention on follow-up. 3. Aortic Atherosclerosis (ICD10-I70.0).   04/17/2020 Echocardiogram   1. Normal LVEF, normal and unchanged GLS: -20.2%.  2. Left ventricular ejection fraction, by estimation, is 60 to 65%. The left ventricle has normal function. The left ventricle has no regional wall motion abnormalities. There is mild concentric left ventricular hypertrophy. Left ventricular  diastolic parameters are consistent with Grade I diastolic dysfunction (impaired relaxation). The average left ventricular global longitudinal strain is -20.2 %. The global longitudinal strain is normal.  3. Right ventricular systolic function is normal. The right ventricular size is normal.  4. The mitral valve is normal in structure. Mild mitral valve regurgitation. No evidence of mitral stenosis.  5. The aortic valve is normal in structure. There is mild calcification of the aortic valve. There is mild thickening of the aortic valve. Aortic valve regurgitation is mild. No aortic stenosis is present.  6. The inferior vena cava is normal in size with greater than 50% respiratory variability, suggesting right atrial pressure of 3 mmHg.   08/02/2020 Echocardiogram   1. Left ventricular ejection fraction, by estimation, is 55 to 60%. The left ventricle has normal function. The left ventricle has no regional wall motion abnormalities. Left ventricular diastolic parameters are consistent with Grade I diastolic dysfunction (impaired relaxation). The average left ventricular global longitudinal strain is -27.0 %. The global longitudinal strain is normal.  2. Right ventricular systolic function is normal. The right ventricular size is normal. Tricuspid regurgitation signal is inadequate for assessing PA pressure.  3. The mitral valve is grossly normal. Trivial mitral valve regurgitation. No evidence of mitral stenosis.  4. The aortic valve is  tricuspid. Aortic valve regurgitation is mild. No aortic stenosis is present.  5. The inferior vena cava is normal in size with greater than 50% respiratory variability, suggesting right atrial pressure of 3 mmHg.   09/19/2020 Imaging   1. Status post hysterectomy. No evidence of recurrent or new metastatic disease within the chest, abdomen, or pelvis. 2. Scattered bilateral tiny pulmonary nodules, unchanged from prior studies and most likely sequela of prior infection or  inflammation, recommend attention on follow-up imaging. No new suspicious pulmonary nodules or masses. 3. Subcutaneous edema and small hematoma overlying the posterior gluteal musculature, recommend correlation with recent history of trauma. 4. Mild low-density wall thickening of the gastric antrum, which may represent gastritis. 5. Hepatic steatosis. 6. Extensive sigmoid colonic diverticulosis without findings of acute diverticulitis. 7. Aortic atherosclerosis.  Aortic Atherosclerosis (ICD10-I70.0).   11/20/2020 Echocardiogram    1. Compared to echo from Jan 2022, Global longitudinal strain is less negative. (Previously -27%). Left ventricular ejection fraction, by estimation, is 60 to 65%. The left ventricle has normal function. The left ventricle has no regional wall motion abnormalities. There is mild left ventricular hypertrophy. Left ventricular diastolic parameters are indeterminate.  2. Right ventricular systolic function is normal. The right ventricular size is normal.  3. The mitral valve is normal in structure. Trivial mitral valve regurgitation.  4. Aortic valve regurgitation is mild. Mild aortic valve sclerosis is present, with no evidence of aortic valve stenosis.     03/12/2021 Echocardiogram   1. Left ventricular ejection fraction, by estimation, is 60 to 65%. Left ventricular ejection fraction by 2D MOD biplane is 63.6 %. The left ventricle has normal function. The left ventricle has no regional wall motion abnormalities. There is mild left ventricular hypertrophy. Left ventricular diastolic parameters are consistent with Grade I diastolic dysfunction (impaired relaxation). The average left ventricular global longitudinal strain is -20.0 %. The global longitudinal strain is normal.  2. Right ventricular systolic function is normal. The right ventricular size is normal. There is normal pulmonary artery systolic pressure. The estimated right ventricular systolic pressure is 62.6 mmHg.   3. The mitral valve is normal in structure. No evidence of mitral valve regurgitation. No evidence of mitral stenosis.  4. The aortic valve is tricuspid. Aortic valve regurgitation is trivial. No aortic stenosis is present. Aortic regurgitation PHT measures 403 msec.  5. The inferior vena cava is normal in size with greater than 50% respiratory variability, suggesting right atrial pressure of 3 mmHg.   04/02/2021 Imaging   Stable small pulmonary nodules scattered throughout the chest, unchanged, without new or suspicious pulmonary nodule.   Three-vessel coronary artery disease.   Aortic Atherosclerosis (ICD10-I70.0).   06/06/2021 Imaging    1. Left ventricular ejection fraction, by estimation, is 60 to 65%. The left ventricle has normal function. The left ventricle has no regional wall motion abnormalities. Left ventricular diastolic parameters are consistent with Grade I diastolic dysfunction (impaired relaxation).  2. Right ventricular systolic function is normal. The right ventricular size is normal. There is normal pulmonary artery systolic pressure. The estimated right ventricular systolic pressure is 94.8 mmHg.  3. Left atrial size was mildly dilated.  4. The mitral valve is normal in structure. Trivial mitral valve regurgitation.  5. The aortic valve is tricuspid. Aortic valve regurgitation is mild. No aortic stenosis is present.  6. The inferior vena cava is normal in size with greater than 50% respiratory variability, suggesting right atrial pressure of 3 mmHg.  7. GLS attempted but not reported  due to suboptimal tracking.     Metastasis to lymph nodes ([place])  10/14/2018 Initial Diagnosis   Metastasis to lymph nodes ([place])   10/21/2018 - 02/24/2019 Chemotherapy   The patient had palonosetron (ALOXI) injection 0.25 mg, 0.25 mg, Intravenous,  Once, 7 of 7 cycles Administration: 0.25 mg (10/21/2018), 0.25 mg (11/11/2018), 0.25 mg (12/02/2018), 0.25 mg (12/23/2018), 0.25 mg (01/13/2019), 0.25 mg  (02/03/2019), 0.25 mg (02/24/2019) CARBOplatin (PARAPLATIN) 310 mg in sodium chloride 0.9 % 250 mL chemo infusion, 310 mg, Intravenous,  Once, 7 of 7 cycles Dose modification: 300 mg (original dose 311.5 mg, Cycle 4, Reason: Dose not tolerated) Administration: 310 mg (10/21/2018), 300 mg (11/11/2018), 300 mg (12/02/2018), 300 mg (12/23/2018), 300 mg (01/13/2019), 300 mg (02/03/2019), 300 mg (02/24/2019) PACLitaxel (TAXOL) 222 mg in sodium chloride 0.9 % 250 mL chemo infusion (> 32m/m2), 135 mg/m2 = 222 mg, Intravenous,  Once, 7 of 7 cycles Administration: 222 mg (10/21/2018), 222 mg (11/11/2018), 222 mg (12/02/2018), 222 mg (12/23/2018), 222 mg (01/13/2019), 222 mg (02/03/2019), 222 mg (02/24/2019) fosaprepitant (EMEND) 150 mg, dexamethasone (DECADRON) 12 mg in sodium chloride 0.9 % 145 mL IVPB, , Intravenous,  Once, 7 of 7 cycles Administration:  (10/21/2018),  (11/11/2018),  (12/02/2018),  (12/23/2018),  (01/13/2019),  (02/03/2019),  (02/24/2019)   for chemotherapy treatment.       PHYSICAL EXAMINATION: ECOG PERFORMANCE STATUS: 0 - Asymptomatic  Vitals:   08/13/21 0928  BP: (!) 148/67  Pulse: 69  Resp: 18  Temp: 98.1 F (36.7 C)  SpO2: 94%   Filed Weights   08/13/21 0928  Weight: 128 lb 6.4 oz (58.2 kg)    GENERAL:alert, no distress and comfortable NEURO: alert & oriented x 3 with fluent speech, no focal motor/sensory deficits  LABORATORY DATA:  I have reviewed the data as listed    Component Value Date/Time   NA 137 08/13/2021 0910   NA 139 02/13/2017 1512   K 3.5 08/13/2021 0910   K 4.2 02/13/2017 1512   CL 101 08/13/2021 0910   CO2 27 08/13/2021 0910   CO2 28 02/13/2017 1512   GLUCOSE 96 08/13/2021 0910   GLUCOSE 118 02/13/2017 1512   BUN 23 08/13/2021 0910   BUN 25.2 02/13/2017 1512   CREATININE 1.27 (H) 08/13/2021 0910   CREATININE 1.37 (H) 12/21/2019 0850   CREATININE 1.2 (H) 02/13/2017 1512   CALCIUM 9.1 08/13/2021 0910   CALCIUM 10.0 02/13/2017 1512   PROT 6.9 08/13/2021 0910    ALBUMIN 4.2 08/13/2021 0910   AST 20 08/13/2021 0910   AST 19 12/21/2019 0850   ALT 12 08/13/2021 0910   ALT 13 12/21/2019 0850   ALKPHOS 59 08/13/2021 0910   BILITOT 0.4 08/13/2021 0910   BILITOT 0.4 12/21/2019 0850   GFRNONAA 42 (L) 08/13/2021 0910   GFRNONAA 37 (L) 12/21/2019 0850   GFRAA 47 (L) 03/28/2020 0933   GFRAA 42 (L) 12/21/2019 0850    No results found for: SPEP, UPEP  Lab Results  Component Value Date   WBC 4.9 08/13/2021   NEUTROABS 2.4 08/13/2021   HGB 10.5 (L) 08/13/2021   HCT 29.9 (L) 08/13/2021   MCV 88.7 08/13/2021   PLT 244 08/13/2021      Chemistry      Component Value Date/Time   NA 137 08/13/2021 0910   NA 139 02/13/2017 1512   K 3.5 08/13/2021 0910   K 4.2 02/13/2017 1512   CL 101 08/13/2021 0910   CO2 27 08/13/2021 0910   CO2 28  02/13/2017 1512   BUN 23 08/13/2021 0910   BUN 25.2 02/13/2017 1512   CREATININE 1.27 (H) 08/13/2021 0910   CREATININE 1.37 (H) 12/21/2019 0850   CREATININE 1.2 (H) 02/13/2017 1512      Component Value Date/Time   CALCIUM 9.1 08/13/2021 0910   CALCIUM 10.0 02/13/2017 1512   ALKPHOS 59 08/13/2021 0910   AST 20 08/13/2021 0910   AST 19 12/21/2019 0850   ALT 12 08/13/2021 0910   ALT 13 12/21/2019 0850   BILITOT 0.4 08/13/2021 0910   BILITOT 0.4 12/21/2019 0850

## 2021-08-13 NOTE — Assessment & Plan Note (Signed)
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 

## 2021-08-14 DIAGNOSIS — H33011 Retinal detachment with single break, right eye: Secondary | ICD-10-CM | POA: Diagnosis not present

## 2021-08-15 DIAGNOSIS — L570 Actinic keratosis: Secondary | ICD-10-CM | POA: Diagnosis not present

## 2021-08-31 ENCOUNTER — Inpatient Hospital Stay (HOSPITAL_COMMUNITY): Admission: RE | Admit: 2021-08-31 | Payer: Medicare HMO | Source: Ambulatory Visit

## 2021-09-04 ENCOUNTER — Other Ambulatory Visit: Payer: Self-pay

## 2021-09-04 ENCOUNTER — Inpatient Hospital Stay: Payer: Medicare HMO

## 2021-09-04 ENCOUNTER — Inpatient Hospital Stay: Payer: Medicare HMO | Attending: Gynecologic Oncology

## 2021-09-04 VITALS — BP 174/70 | HR 68 | Temp 98.0°F | Resp 18 | Ht 62.0 in | Wt 128.2 lb

## 2021-09-04 DIAGNOSIS — Z5112 Encounter for antineoplastic immunotherapy: Secondary | ICD-10-CM | POA: Insufficient documentation

## 2021-09-04 DIAGNOSIS — C779 Secondary and unspecified malignant neoplasm of lymph node, unspecified: Secondary | ICD-10-CM | POA: Insufficient documentation

## 2021-09-04 DIAGNOSIS — C541 Malignant neoplasm of endometrium: Secondary | ICD-10-CM | POA: Diagnosis present

## 2021-09-04 LAB — CBC WITH DIFFERENTIAL/PLATELET
Abs Immature Granulocytes: 0.02 10*3/uL (ref 0.00–0.07)
Basophils Absolute: 0 10*3/uL (ref 0.0–0.1)
Basophils Relative: 1 %
Eosinophils Absolute: 0.2 10*3/uL (ref 0.0–0.5)
Eosinophils Relative: 4 %
HCT: 29.5 % — ABNORMAL LOW (ref 36.0–46.0)
Hemoglobin: 10.1 g/dL — ABNORMAL LOW (ref 12.0–15.0)
Immature Granulocytes: 0 %
Lymphocytes Relative: 30 %
Lymphs Abs: 1.6 10*3/uL (ref 0.7–4.0)
MCH: 30.8 pg (ref 26.0–34.0)
MCHC: 34.2 g/dL (ref 30.0–36.0)
MCV: 89.9 fL (ref 80.0–100.0)
Monocytes Absolute: 0.4 10*3/uL (ref 0.1–1.0)
Monocytes Relative: 8 %
Neutro Abs: 3 10*3/uL (ref 1.7–7.7)
Neutrophils Relative %: 57 %
Platelets: 232 10*3/uL (ref 150–400)
RBC: 3.28 MIL/uL — ABNORMAL LOW (ref 3.87–5.11)
RDW: 12.4 % (ref 11.5–15.5)
WBC: 5.2 10*3/uL (ref 4.0–10.5)
nRBC: 0 % (ref 0.0–0.2)

## 2021-09-04 LAB — COMPREHENSIVE METABOLIC PANEL
ALT: 15 U/L (ref 0–44)
AST: 20 U/L (ref 15–41)
Albumin: 4.1 g/dL (ref 3.5–5.0)
Alkaline Phosphatase: 56 U/L (ref 38–126)
Anion gap: 8 (ref 5–15)
BUN: 26 mg/dL — ABNORMAL HIGH (ref 8–23)
CO2: 27 mmol/L (ref 22–32)
Calcium: 9 mg/dL (ref 8.9–10.3)
Chloride: 104 mmol/L (ref 98–111)
Creatinine, Ser: 1.26 mg/dL — ABNORMAL HIGH (ref 0.44–1.00)
GFR, Estimated: 43 mL/min — ABNORMAL LOW (ref >60)
Glucose, Bld: 93 mg/dL (ref 70–99)
Potassium: 4 mmol/L (ref 3.5–5.1)
Sodium: 139 mmol/L (ref 135–145)
Total Bilirubin: 0.3 mg/dL (ref 0.3–1.2)
Total Protein: 6.6 g/dL (ref 6.5–8.1)

## 2021-09-04 MED ORDER — SODIUM CHLORIDE 0.9 % IV SOLN: Freq: Once | INTRAVENOUS | Status: AC

## 2021-09-04 MED ORDER — SODIUM CHLORIDE 0.9% FLUSH
10.0000 mL | INTRAVENOUS | Status: DC | PRN
  Administered 2021-09-04: 10 mL

## 2021-09-04 MED ORDER — SODIUM CHLORIDE 0.9% FLUSH
10.0000 mL | Freq: Once | INTRAVENOUS | Status: AC
  Administered 2021-09-04: 10 mL

## 2021-09-04 MED ORDER — ACETAMINOPHEN 325 MG PO TABS
650.0000 mg | ORAL_TABLET | Freq: Once | ORAL | Status: AC
  Administered 2021-09-04: 650 mg via ORAL
  Filled 2021-09-04: qty 2

## 2021-09-04 MED ORDER — DIPHENHYDRAMINE HCL 25 MG PO CAPS
25.0000 mg | ORAL_CAPSULE | Freq: Once | ORAL | Status: AC
  Administered 2021-09-04: 25 mg via ORAL
  Filled 2021-09-04: qty 1

## 2021-09-04 MED ORDER — TRASTUZUMAB-ANNS CHEMO 150 MG IV SOLR
6.0000 mg/kg | Freq: Once | INTRAVENOUS | Status: AC
  Administered 2021-09-04: 378 mg via INTRAVENOUS
  Filled 2021-09-04: qty 18

## 2021-09-04 MED ORDER — HEPARIN SOD (PORK) LOCK FLUSH 100 UNIT/ML IV SOLN
500.0000 [IU] | Freq: Once | INTRAVENOUS | Status: AC | PRN
  Administered 2021-09-04: 500 [IU]

## 2021-09-10 ENCOUNTER — Telehealth: Payer: Self-pay

## 2021-09-10 NOTE — Telephone Encounter (Signed)
She called back. She rescheduled CT to 4/3 due to going back to [place].  Look at 3/14 appts. Do you want to see her that day? Leave appts as scheduled?

## 2021-09-10 NOTE — Telephone Encounter (Signed)
Returned her call. She is wanting to change CT appt on 3/10 due to babysitting grand kids. Given radiology scheduling #. She will reschedule and call the office back with new appt.

## 2021-09-10 NOTE — Telephone Encounter (Signed)
I am trying to understand her needs: does she plan to keep all her appt on 3/14? If yes, I am ok for CT to be moved and keep everything as is If not, we need to reschedule everything until she returns in April

## 2021-09-10 NOTE — Telephone Encounter (Signed)
She will keep all appt son 3/14. Reviewed appts.

## 2021-09-21 ENCOUNTER — Ambulatory Visit (HOSPITAL_COMMUNITY): Payer: Medicare HMO

## 2021-09-25 ENCOUNTER — Encounter: Payer: Self-pay | Admitting: Hematology and Oncology

## 2021-09-25 ENCOUNTER — Inpatient Hospital Stay: Payer: Medicare HMO

## 2021-09-25 ENCOUNTER — Ambulatory Visit (HOSPITAL_COMMUNITY)
Admission: RE | Admit: 2021-09-25 | Discharge: 2021-09-25 | Disposition: A | Discharge status: Home / Self Care | Payer: Medicare HMO | Source: Ambulatory Visit | Attending: Hematology and Oncology | Admitting: Hematology and Oncology

## 2021-09-25 ENCOUNTER — Inpatient Hospital Stay: Payer: Medicare HMO | Admitting: Hematology and Oncology

## 2021-09-25 ENCOUNTER — Inpatient Hospital Stay: Payer: Medicare HMO | Attending: Gynecologic Oncology

## 2021-09-25 ENCOUNTER — Other Ambulatory Visit: Payer: Self-pay

## 2021-09-25 DIAGNOSIS — I10 Essential (primary) hypertension: Secondary | ICD-10-CM

## 2021-09-25 DIAGNOSIS — Z7982 Long term (current) use of aspirin: Secondary | ICD-10-CM | POA: Insufficient documentation

## 2021-09-25 DIAGNOSIS — I351 Nonrheumatic aortic (valve) insufficiency: Secondary | ICD-10-CM | POA: Insufficient documentation

## 2021-09-25 DIAGNOSIS — C541 Malignant neoplasm of endometrium: Secondary | ICD-10-CM

## 2021-09-25 DIAGNOSIS — Z5112 Encounter for antineoplastic immunotherapy: Secondary | ICD-10-CM | POA: Diagnosis not present

## 2021-09-25 DIAGNOSIS — N183 Chronic kidney disease, stage 3 unspecified: Secondary | ICD-10-CM

## 2021-09-25 DIAGNOSIS — C779 Secondary and unspecified malignant neoplasm of lymph node, unspecified: Secondary | ICD-10-CM | POA: Diagnosis present

## 2021-09-25 DIAGNOSIS — Z90710 Acquired absence of both cervix and uterus: Secondary | ICD-10-CM | POA: Diagnosis not present

## 2021-09-25 DIAGNOSIS — Z79899 Other long term (current) drug therapy: Secondary | ICD-10-CM | POA: Insufficient documentation

## 2021-09-25 DIAGNOSIS — D631 Anemia in chronic kidney disease: Secondary | ICD-10-CM | POA: Diagnosis not present

## 2021-09-25 DIAGNOSIS — D539 Nutritional anemia, unspecified: Secondary | ICD-10-CM | POA: Insufficient documentation

## 2021-09-25 DIAGNOSIS — Z0189 Encounter for other specified special examinations: Secondary | ICD-10-CM

## 2021-09-25 DIAGNOSIS — I129 Hypertensive chronic kidney disease with stage 1 through stage 4 chronic kidney disease, or unspecified chronic kidney disease: Secondary | ICD-10-CM | POA: Diagnosis not present

## 2021-09-25 LAB — COMPREHENSIVE METABOLIC PANEL
ALT: 13 U/L (ref 0–44)
AST: 18 U/L (ref 15–41)
Albumin: 4.3 g/dL (ref 3.5–5.0)
Alkaline Phosphatase: 60 U/L (ref 38–126)
Anion gap: 10 (ref 5–15)
BUN: 31 mg/dL — ABNORMAL HIGH (ref 8–23)
CO2: 26 mmol/L (ref 22–32)
Calcium: 9.1 mg/dL (ref 8.9–10.3)
Chloride: 101 mmol/L (ref 98–111)
Creatinine, Ser: 1.46 mg/dL — ABNORMAL HIGH (ref 0.44–1.00)
GFR, Estimated: 36 mL/min — ABNORMAL LOW (ref >60)
Glucose, Bld: 121 mg/dL — ABNORMAL HIGH (ref 70–99)
Potassium: 3.3 mmol/L — ABNORMAL LOW (ref 3.5–5.1)
Sodium: 137 mmol/L (ref 135–145)
Total Bilirubin: 0.4 mg/dL (ref 0.3–1.2)
Total Protein: 6.8 g/dL (ref 6.5–8.1)

## 2021-09-25 LAB — CBC WITH DIFFERENTIAL/PLATELET
Abs Immature Granulocytes: 0.04 10*3/uL (ref 0.00–0.07)
Basophils Absolute: 0.1 10*3/uL (ref 0.0–0.1)
Basophils Relative: 1 %
Eosinophils Absolute: 0.2 10*3/uL (ref 0.0–0.5)
Eosinophils Relative: 3 %
HCT: 30.2 % — ABNORMAL LOW (ref 36.0–46.0)
Hemoglobin: 10.7 g/dL — ABNORMAL LOW (ref 12.0–15.0)
Immature Granulocytes: 1 %
Lymphocytes Relative: 22 %
Lymphs Abs: 1.4 10*3/uL (ref 0.7–4.0)
MCH: 31.3 pg (ref 26.0–34.0)
MCHC: 35.4 g/dL (ref 30.0–36.0)
MCV: 88.3 fL (ref 80.0–100.0)
Monocytes Absolute: 0.6 10*3/uL (ref 0.1–1.0)
Monocytes Relative: 9 %
Neutro Abs: 4.2 10*3/uL (ref 1.7–7.7)
Neutrophils Relative %: 64 %
Platelets: 216 10*3/uL (ref 150–400)
RBC: 3.42 MIL/uL — ABNORMAL LOW (ref 3.87–5.11)
RDW: 12.5 % (ref 11.5–15.5)
WBC: 6.5 10*3/uL (ref 4.0–10.5)
nRBC: 0 % (ref 0.0–0.2)

## 2021-09-25 LAB — ECHOCARDIOGRAM COMPLETE
AR max vel: 2.69 cm2
AV Peak grad: 3.6 mmHg
Ao pk vel: 0.94 m/s
Area-P 1/2: 5.84 cm2
P 1/2 time: 509 msec
S' Lateral: 2.5 cm

## 2021-09-25 MED ORDER — ACETAMINOPHEN 325 MG PO TABS
650.0000 mg | ORAL_TABLET | Freq: Once | ORAL | Status: AC
  Administered 2021-09-25: 650 mg via ORAL
  Filled 2021-09-25: qty 2

## 2021-09-25 MED ORDER — TRASTUZUMAB-ANNS CHEMO 150 MG IV SOLR
6.0000 mg/kg | Freq: Once | INTRAVENOUS | Status: AC
  Administered 2021-09-25: 378 mg via INTRAVENOUS
  Filled 2021-09-25: qty 18

## 2021-09-25 MED ORDER — HEPARIN SOD (PORK) LOCK FLUSH 100 UNIT/ML IV SOLN
500.0000 [IU] | Freq: Once | INTRAVENOUS | Status: AC | PRN
  Administered 2021-09-25: 500 [IU]

## 2021-09-25 MED ORDER — DIPHENHYDRAMINE HCL 25 MG PO CAPS
25.0000 mg | ORAL_CAPSULE | Freq: Once | ORAL | Status: AC
  Administered 2021-09-25: 25 mg via ORAL
  Filled 2021-09-25: qty 1

## 2021-09-25 MED ORDER — SODIUM CHLORIDE 0.9% FLUSH
10.0000 mL | INTRAVENOUS | Status: DC | PRN
  Administered 2021-09-25: 10 mL

## 2021-09-25 MED ORDER — SODIUM CHLORIDE 0.9 % IV SOLN: Freq: Once | INTRAVENOUS | Status: AC

## 2021-09-25 MED ORDER — SODIUM CHLORIDE 0.9% FLUSH
10.0000 mL | Freq: Once | INTRAVENOUS | Status: AC
  Administered 2021-09-25: 10 mL

## 2021-09-25 NOTE — Patient Instructions (Addendum)
[facility]  Discharge Instructions: ?Thank you for choosing [facility] to provide your oncology and hematology care.  ? ?If you have a lab appointment with the Cancer Center, please go directly to the Cancer Center and check in at the registration area. ?  ?Wear comfortable clothing and clothing appropriate for easy access to any Portacath or PICC line.  ? ?We strive to give you quality time with your provider. You may need to reschedule your appointment if you arrive late (15 or more minutes).  Arriving late affects you and other patients whose appointments are after yours.  Also, if you miss three or more appointments without notifying the office, you may be dismissed from the clinic at the provider?s discretion.    ?  ?For prescription refill requests, have your pharmacy contact our office and allow 72 hours for refills to be completed.   ? ?Today you received the following chemotherapy and/or immunotherapy agents: Trastuzumab    ?  ?To help prevent nausea and vomiting after your treatment, we encourage you to take your nausea medication as directed. ? ?BELOW ARE SYMPTOMS THAT SHOULD BE REPORTED IMMEDIATELY: ?*FEVER GREATER THAN 100.4 F (38 ?C) OR HIGHER ?*CHILLS OR SWEATING ?*NAUSEA AND VOMITING THAT IS NOT CONTROLLED WITH YOUR NAUSEA MEDICATION ?*UNUSUAL SHORTNESS OF BREATH ?*UNUSUAL BRUISING OR BLEEDING ?*URINARY PROBLEMS (pain or burning when urinating, or frequent urination) ?*BOWEL PROBLEMS (unusual diarrhea, constipation, pain near the anus) ?TENDERNESS IN MOUTH AND THROAT WITH OR WITHOUT PRESENCE OF ULCERS (sore throat, sores in mouth, or a toothache) ?UNUSUAL RASH, SWELLING OR PAIN  ?UNUSUAL VAGINAL DISCHARGE OR ITCHING  ? ?Items with * indicate a potential emergency and should be followed up as soon as possible or go to the Emergency Department if any problems should occur. ? ?Please show the CHEMOTHERAPY ALERT CARD or IMMUNOTHERAPY ALERT CARD at check-in  to the Emergency Department and triage nurse. ? ?Should you have questions after your visit or need to cancel or reschedule your appointment, please contact [facility]  Dept: [phone]  and follow the prompts.  Office hours are 8:00 a.m. to 4:30 p.m. Monday - Friday. Please note that voicemails left after 4:00 p.m. may not be returned until the following business day.  We are closed weekends and major holidays. You have access to a nurse at all times for urgent questions. Please call the main number to the clinic Dept: [phone] and follow the prompts. ? ? ?For any non-urgent questions, you may also contact your provider using MyChart. We now offer e-Visits for anyone 18 and older to request care online for non-urgent symptoms. For details visit [email or web address]. ?  ?Also download the MyChart app! Go to the app store, search "MyChart", open the app, select [facility], and log in with your MyChart username and password. ? ?Due to Covid, a mask is required upon entering the hospital/clinic. If you do not have a mask, one will be given to you upon arrival. For doctor visits, patients may have 1 support person aged 18 or older with them. For treatment visits, patients cannot have anyone with them due to current Covid guidelines and our immunocompromised population.  ? ?

## 2021-09-25 NOTE — Assessment & Plan Note (Signed)
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 

## 2021-09-25 NOTE — Progress Notes (Signed)
[facility] ?OFFICE PROGRESS NOTE ? ?Patient Care Team: ?Burnard Bunting, [name] ? ?ASSESSMENT & PLAN:  ?Endometrial cancer ([place]) ?She missed a flight recently and hence a CT scan has to be rescheduled ?Clinically, she is not symptomatic and her echocardiogram is normal ?We will proceed with treatment without delay ? ?CKD (chronic kidney disease), stage III ([place]) ?Renal function is stable. Monitor closely ?She was seen by nephrologist recently ? ?Deficiency anemia ?She has multifactorial anemia, anemia secondary to prior chemotherapy as well as chronic kidney disease ?Anemia is improving/stable ?Recent vitamin B12 level was adequate ?She is not symptomatic ?Observe only ? ?No orders of the defined types were placed in this encounter. ? ? ?All questions were answered. The patient knows to call the clinic with any problems, questions or concerns. ?The total time spent in the appointment was 20 minutes encounter with patients including review of chart and various tests results, discussions about plan of care and coordination of care plan ?  ?[name], MD ?09/25/2021 12:36 PM ? ?INTERVAL HISTORY: ?Please see below for problem oriented charting. ?she returns for treatment follow-up on trastuzumab ?She is doing well ?No new side effects ?Denies new lymphadenopathy ? ?REVIEW OF SYSTEMS:   ?Constitutional: Denies fevers, chills or abnormal weight loss ?Eyes: Denies blurriness of vision ?Ears, nose, mouth, throat, and face: Denies mucositis or sore throat ?Respiratory: Denies cough, dyspnea or wheezes ?Cardiovascular: Denies palpitation, chest discomfort or lower extremity swelling ?Gastrointestinal:  Denies nausea, heartburn or change in bowel habits ?Skin: Denies abnormal skin rashes ?Lymphatics: Denies new lymphadenopathy or easy bruising ?Neurological:Denies numbness, tingling or new weaknesses ?Behavioral/Psych: Mood is stable, no new changes  ?All other systems were  reviewed with the patient and are negative. ? ?I have reviewed the past medical history, past surgical history, social history and family history with the patient and they are unchanged from previous note. ? ?ALLERGIES:  has No Known Allergies. ? ?MEDICATIONS:  ?Current Outpatient Medications  ?Medication Sig Dispense Refill  ? acetaminophen (TYLENOL) 500 MG tablet Take 1,000 mg by mouth every 6 (six) hours as needed.    ? aspirin EC 81 MG tablet Take 81 mg by mouth daily.    ? atorvastatin (LIPITOR) 20 MG tablet Take 20 mg by mouth every evening.     ? Calcium Carbonate-Vitamin D (CALCIUM-VITAMIN D) 500-200 MG-UNIT per tablet Take 1 tablet by mouth daily.    ? citalopram (CELEXA) 20 MG tablet Take 20 mg by mouth at bedtime.     ? estradiol (ESTRACE) 0.1 MG/GM vaginal cream PLACE 1 APPLICATORFUL VAGINALLY 3 TIMES A WEEK 42.5 g 12  ? gabapentin (NEURONTIN) 600 MG tablet Take 600 mg by mouth at bedtime as needed.    ? levothyroxine (SYNTHROID, LEVOTHROID) 100 MCG tablet Take 100 mcg by mouth daily before breakfast.     ? lidocaine-prilocaine (EMLA) cream Apply to affected area once 30 g 3  ? losartan-hydrochlorothiazide (HYZAAR) 100-25 MG tablet     ? magnesium oxide (MAG-OX) 400 (241.3 Mg) MG tablet Take 1 tablet (400 mg total) by mouth daily. 30 tablet 11  ? metoprolol succinate (TOPROL-XL) 25 MG 24 hr tablet Take 25 mg by mouth daily.    ? Multiple Vitamin (MULTIVITAMIN WITH MINERALS) TABS Take 1 tablet by mouth daily.    ? ondansetron (ZOFRAN) 8 MG tablet Take 1 tablet (8 mg total) by mouth every 8 (eight) hours as needed. Start on the third day after chemotherapy. 30 tablet  1  ? valACYclovir (VALTREX) 1000 MG tablet valacyclovir 1 gram tablet    ? ?No current facility-administered medications for this visit.  ? ?Facility-Administered Medications Ordered in Other Visits  ?Medication Dose Route Frequency Provider Last Rate Last Admin  ? acetaminophen (TYLENOL) tablet 650 mg  650 mg Oral Once [name], MD      ?  diphenhydrAMINE (BENADRYL) capsule 25 mg  25 mg Oral Once Alvy Bimler, Shawne Bulow, MD      ? heparin lock flush 100 unit/mL  500 Units Intracatheter Once PRN Alvy Bimler, Hendricks Schwandt, MD      ? sodium chloride flush (NS) 0.9 % injection 10 mL  10 mL Intracatheter PRN Alvy Bimler, Ruchy Wildrick, MD   10 mL at 09/04/21 1522  ? sodium chloride flush (NS) 0.9 % injection 10 mL  10 mL Intracatheter PRN [name], MD      ? trastuzumab-anns (KANJINTI) 378 mg in sodium chloride 0.9 % 250 mL chemo infusion  6 mg/kg (Treatment Plan Recorded) Intravenous Once [name], MD      ? ? ?SUMMARY OF ONCOLOGIC HISTORY: ?Oncology History Overview Note  ?Vicki C[facility]  has a remote history of left  breast cancer at age 77 but received BRCA testing approximately 5 years ago which was negative. Her cancer was treated with surgery but no radiation or chemotherapy.  ? 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 ? ?  ?Endometrial cancer [facility])  ?08/29/2016 Pathology Results  ? Endometrium, biopsy ?- HIGH GRADE ENDOMETRIAL CARCINOMA, SEE COMMENT. ?Microscopic Comment ?The sections show multiple fragments of adenocarcinoma displaying glandular and papillary patterns associated with high grade cytomorphology characterized by nuclear pleomorphism, prominent nucleoli and brisk mitosis. Immunohistochemical stains show that the tumor cells are positive for vimentin, p16, p53 with increased Ki-67 expression. Estrogen and progesterone receptor stains show patchy weak positivity. No significant positivity is seen with CEA. The findings are consistent with high grade endometrial carcinoma and the overall morphology and phenotypic features favor serous carcinoma. ?  ?08/29/2016 Initial Diagnosis  ? She presented with postmenopausal bleeding ?  ?10/03/2016 Imaging  ? CT C/A/P 09/2016 ?IMPRESSION: ?1. Thickening of the endometrial canal up to 19 mm in fundus, presumably corresponding to the patient's reported endometrial carcinoma. ?2. Multiple tiny pulmonary  nodules scattered throughout the lungs bilaterally measuring 4 mm or less in size. Nodules of this size are typically considered statistically likely benign. In the setting of known primary malignancy, metastatic disease to the lungs is not excluded, but is not strongly favored on today's examination. Attention on followup studies is recommended to ensure the stability or resolution of these nodules. ?3. Subcentimeter low-attenuation lesion in the central aspect of segment 8 of the liver is too small to characterize. This is statistically likely a tiny cyst, but warrants attention on follow-up studies to exclude the possibility of a solitary hepatic metastasis. ?4. 1.5 x 1.5 x 1.7 cm well-circumscribed lesion in the proximal stomach. This is of uncertain etiology and significance, and could represent a benign gastric polyp, however, further evaluation with nonemergent endoscopy is suggested in the near future for further evaluation. ?5. **An incidental finding of potential clinical significance has been found. 1.1 x 1.6 cm thyroid nodule in the inferior aspect of the right lobe of the thyroid gland. Follow-up evaluation with nonemergent thyroid ultrasound is recommended in the near future to better evaluate this finding. This recommendation follows ACR consensuss guidelines: Managing Incidental Thyroid Nodules Detected on Imaging: White Paper of the ACR  Incidental Thyroid Findings Committee. J Am Coll Radiol 2015;12(2):143-150.** ?6. Aortic atherosclerosis, in addition to left anterior descending coronary artery disease ?  ?10/22/2016 Surgery  ? Robotic assisted total hysterectomy, BSO and bilateral pelvic lymphadenectomy ? ?Final pathology revealed a 3cm polyp containing serous carcinoma but with no myometrial invasion, no LVSI and negative nodes. ? ?Stage IA Uterine serous cancer ?  ?10/22/2016 Pathology Results  ? 1. Lymph nodes, regional resection, right pelvic ?- SIX BENIGN LYMPH NODES (0/6). ?2. Lymph nodes,  regional resection, left pelvic ?- SEVEN BENIGN LYMPH NODES (0/7). ?3. Uterus +/- tubes/ovaries, neoplastic, with right ovary and fallopian tube ?ENDOMYOMETRIUM ?- SEROUS CARCINOMA ARISING WITHIN AN ENDOMETRI

## 2021-09-25 NOTE — Assessment & Plan Note (Signed)
Renal function is stable. Monitor closely She was seen by nephrologist recently 

## 2021-09-25 NOTE — Progress Notes (Signed)
Echocardiogram ?2D Echocardiogram has been performed. ? ?Vicki Cervantes ?09/25/2021, 9:35 AM ?

## 2021-09-25 NOTE — Assessment & Plan Note (Signed)
She missed a flight recently and hence a CT scan has to be rescheduled ?Clinically, she is not symptomatic and her echocardiogram is normal ?We will proceed with treatment without delay ?

## 2021-10-12 DIAGNOSIS — L03032 Cellulitis of left toe: Secondary | ICD-10-CM | POA: Diagnosis not present

## 2021-10-15 ENCOUNTER — Ambulatory Visit (HOSPITAL_COMMUNITY)
Admission: RE | Admit: 2021-10-15 | Discharge: 2021-10-15 | Disposition: A | Discharge status: Home / Self Care | Payer: Medicare HMO | Source: Ambulatory Visit | Attending: Hematology and Oncology | Admitting: Hematology and Oncology

## 2021-10-15 DIAGNOSIS — I700 Atherosclerosis of aorta: Secondary | ICD-10-CM | POA: Diagnosis not present

## 2021-10-15 DIAGNOSIS — C541 Malignant neoplasm of endometrium: Secondary | ICD-10-CM | POA: Diagnosis not present

## 2021-10-15 DIAGNOSIS — M160 Bilateral primary osteoarthritis of hip: Secondary | ICD-10-CM | POA: Diagnosis not present

## 2021-10-15 DIAGNOSIS — K449 Diaphragmatic hernia without obstruction or gangrene: Secondary | ICD-10-CM | POA: Diagnosis not present

## 2021-10-15 DIAGNOSIS — I2510 Atherosclerotic heart disease of native coronary artery without angina pectoris: Secondary | ICD-10-CM | POA: Diagnosis not present

## 2021-10-15 DIAGNOSIS — Z8542 Personal history of malignant neoplasm of other parts of uterus: Secondary | ICD-10-CM | POA: Diagnosis not present

## 2021-10-15 DIAGNOSIS — K5730 Diverticulosis of large intestine without perforation or abscess without bleeding: Secondary | ICD-10-CM | POA: Diagnosis not present

## 2021-10-15 MED ORDER — SODIUM CHLORIDE (PF) 0.9 % IJ SOLN
INTRAMUSCULAR | Status: AC
  Filled 2021-10-15: qty 50

## 2021-10-15 MED ORDER — IOHEXOL 300 MG/ML  SOLN
100.0000 mL | Freq: Once | INTRAMUSCULAR | Status: AC | PRN
  Administered 2021-10-15: 100 mL via INTRAVENOUS

## 2021-10-16 ENCOUNTER — Inpatient Hospital Stay: Payer: Medicare HMO

## 2021-10-16 ENCOUNTER — Inpatient Hospital Stay: Payer: Medicare HMO | Attending: Gynecologic Oncology

## 2021-10-16 ENCOUNTER — Encounter: Payer: Self-pay | Admitting: Hematology and Oncology

## 2021-10-16 ENCOUNTER — Other Ambulatory Visit: Payer: Self-pay

## 2021-10-16 ENCOUNTER — Inpatient Hospital Stay: Payer: Medicare HMO | Admitting: Hematology and Oncology

## 2021-10-16 DIAGNOSIS — D631 Anemia in chronic kidney disease: Secondary | ICD-10-CM | POA: Insufficient documentation

## 2021-10-16 DIAGNOSIS — C541 Malignant neoplasm of endometrium: Secondary | ICD-10-CM

## 2021-10-16 DIAGNOSIS — C779 Secondary and unspecified malignant neoplasm of lymph node, unspecified: Secondary | ICD-10-CM | POA: Diagnosis present

## 2021-10-16 DIAGNOSIS — Z9221 Personal history of antineoplastic chemotherapy: Secondary | ICD-10-CM | POA: Insufficient documentation

## 2021-10-16 DIAGNOSIS — Z7982 Long term (current) use of aspirin: Secondary | ICD-10-CM | POA: Insufficient documentation

## 2021-10-16 DIAGNOSIS — Z79899 Other long term (current) drug therapy: Secondary | ICD-10-CM | POA: Diagnosis not present

## 2021-10-16 DIAGNOSIS — Z5112 Encounter for antineoplastic immunotherapy: Secondary | ICD-10-CM | POA: Insufficient documentation

## 2021-10-16 DIAGNOSIS — D630 Anemia in neoplastic disease: Secondary | ICD-10-CM

## 2021-10-16 DIAGNOSIS — Z90710 Acquired absence of both cervix and uterus: Secondary | ICD-10-CM | POA: Insufficient documentation

## 2021-10-16 DIAGNOSIS — N183 Chronic kidney disease, stage 3 unspecified: Secondary | ICD-10-CM

## 2021-10-16 LAB — COMPREHENSIVE METABOLIC PANEL
ALT: 13 U/L (ref 0–44)
AST: 20 U/L (ref 15–41)
Albumin: 4.3 g/dL (ref 3.5–5.0)
Alkaline Phosphatase: 60 U/L (ref 38–126)
Anion gap: 9 (ref 5–15)
BUN: 29 mg/dL — ABNORMAL HIGH (ref 8–23)
CO2: 27 mmol/L (ref 22–32)
Calcium: 9.1 mg/dL (ref 8.9–10.3)
Chloride: 100 mmol/L (ref 98–111)
Creatinine, Ser: 1.35 mg/dL — ABNORMAL HIGH (ref 0.44–1.00)
GFR, Estimated: 39 mL/min — ABNORMAL LOW (ref >60)
Glucose, Bld: 103 mg/dL — ABNORMAL HIGH (ref 70–99)
Potassium: 3.2 mmol/L — ABNORMAL LOW (ref 3.5–5.1)
Sodium: 136 mmol/L (ref 135–145)
Total Bilirubin: 0.4 mg/dL (ref 0.3–1.2)
Total Protein: 7.1 g/dL (ref 6.5–8.1)

## 2021-10-16 LAB — CBC WITH DIFFERENTIAL/PLATELET
Abs Immature Granulocytes: 0.02 10*3/uL (ref 0.00–0.07)
Basophils Absolute: 0.1 10*3/uL (ref 0.0–0.1)
Basophils Relative: 1 %
Eosinophils Absolute: 0.2 10*3/uL (ref 0.0–0.5)
Eosinophils Relative: 3 %
HCT: 31 % — ABNORMAL LOW (ref 36.0–46.0)
Hemoglobin: 10.8 g/dL — ABNORMAL LOW (ref 12.0–15.0)
Immature Granulocytes: 0 %
Lymphocytes Relative: 25 %
Lymphs Abs: 1.4 10*3/uL (ref 0.7–4.0)
MCH: 30.9 pg (ref 26.0–34.0)
MCHC: 34.8 g/dL (ref 30.0–36.0)
MCV: 88.6 fL (ref 80.0–100.0)
Monocytes Absolute: 0.6 10*3/uL (ref 0.1–1.0)
Monocytes Relative: 11 %
Neutro Abs: 3.4 10*3/uL (ref 1.7–7.7)
Neutrophils Relative %: 60 %
Platelets: 218 10*3/uL (ref 150–400)
RBC: 3.5 MIL/uL — ABNORMAL LOW (ref 3.87–5.11)
RDW: 12.4 % (ref 11.5–15.5)
WBC: 5.7 10*3/uL (ref 4.0–10.5)
nRBC: 0 % (ref 0.0–0.2)

## 2021-10-16 MED ORDER — SODIUM CHLORIDE 0.9 % IV SOLN: Freq: Once | INTRAVENOUS | Status: AC

## 2021-10-16 MED ORDER — SODIUM CHLORIDE 0.9% FLUSH
10.0000 mL | Freq: Once | INTRAVENOUS | Status: AC
  Administered 2021-10-16: 10 mL

## 2021-10-16 MED ORDER — ACETAMINOPHEN 325 MG PO TABS
650.0000 mg | ORAL_TABLET | Freq: Once | ORAL | Status: AC
  Administered 2021-10-16: 650 mg via ORAL
  Filled 2021-10-16: qty 2

## 2021-10-16 MED ORDER — HEPARIN SOD (PORK) LOCK FLUSH 100 UNIT/ML IV SOLN
500.0000 [IU] | Freq: Once | INTRAVENOUS | Status: AC | PRN
  Administered 2021-10-16: 500 [IU]

## 2021-10-16 MED ORDER — DIPHENHYDRAMINE HCL 25 MG PO CAPS
25.0000 mg | ORAL_CAPSULE | Freq: Once | ORAL | Status: AC
  Administered 2021-10-16: 25 mg via ORAL
  Filled 2021-10-16: qty 1

## 2021-10-16 MED ORDER — TRASTUZUMAB-ANNS CHEMO 150 MG IV SOLR
6.0000 mg/kg | Freq: Once | INTRAVENOUS | Status: AC
  Administered 2021-10-16: 378 mg via INTRAVENOUS
  Filled 2021-10-16: qty 18

## 2021-10-16 MED ORDER — SODIUM CHLORIDE 0.9% FLUSH
10.0000 mL | INTRAVENOUS | Status: DC | PRN
  Administered 2021-10-16: 10 mL

## 2021-10-16 NOTE — Assessment & Plan Note (Signed)
Renal function is stable. Monitor closely She was seen by nephrologist recently 

## 2021-10-16 NOTE — Patient Instructions (Signed)
[facility]  Discharge Instructions: Thank you for choosing [facility] to provide your oncology and hematology care.   If you have a lab appointment with the Cancer Center, please go directly to the Cancer Center and check in at the registration area.   Wear comfortable clothing and clothing appropriate for easy access to any Portacath or PICC line.   We strive to give you quality time with your provider. You may need to reschedule your appointment if you arrive late (15 or more minutes).  Arriving late affects you and other patients whose appointments are after yours.  Also, if you miss three or more appointments without notifying the office, you may be dismissed from the clinic at the provider's discretion.      For prescription refill requests, have your pharmacy contact our office and allow 72 hours for refills to be completed.    Today you received the following chemotherapy and/or immunotherapy agents herceptin      To help prevent nausea and vomiting after your treatment, we encourage you to take your nausea medication as directed.  BELOW ARE SYMPTOMS THAT SHOULD BE REPORTED IMMEDIATELY: *FEVER GREATER THAN 100.4 F (38 C) OR HIGHER *CHILLS OR SWEATING *NAUSEA AND VOMITING THAT IS NOT CONTROLLED WITH YOUR NAUSEA MEDICATION *UNUSUAL SHORTNESS OF BREATH *UNUSUAL BRUISING OR BLEEDING *URINARY PROBLEMS (pain or burning when urinating, or frequent urination) *BOWEL PROBLEMS (unusual diarrhea, constipation, pain near the anus) TENDERNESS IN MOUTH AND THROAT WITH OR WITHOUT PRESENCE OF ULCERS (sore throat, sores in mouth, or a toothache) UNUSUAL RASH, SWELLING OR PAIN  UNUSUAL VAGINAL DISCHARGE OR ITCHING   Items with * indicate a potential emergency and should be followed up as soon as possible or go to the Emergency Department if any problems should occur.  Please show the CHEMOTHERAPY ALERT CARD or IMMUNOTHERAPY ALERT CARD at check-in to  the Emergency Department and triage nurse.  Should you have questions after your visit or need to cancel or reschedule your appointment, please contact [facility]  Dept: [phone]  and follow the prompts.  Office hours are 8:00 a.m. to 4:30 p.m. Monday - Friday. Please note that voicemails left after 4:00 p.m. may not be returned until the following business day.  We are closed weekends and major holidays. You have access to a nurse at all times for urgent questions. Please call the main number to the clinic Dept: [phone] and follow the prompts.   For any non-urgent questions, you may also contact your provider using MyChart. We now offer e-Visits for anyone 18 and older to request care online for non-urgent symptoms. For details visit [email or web address].   Also download the MyChart app! Go to the app store, search "MyChart", open the app, select [facility], and log in with your MyChart username and password.  Due to Covid, a mask is required upon entering the hospital/clinic. If you do not have a mask, one will be given to you upon arrival. For doctor visits, patients may have 1 support person aged 18 or older with them. For treatment visits, patients cannot have anyone with them due to current Covid guidelines and our immunocompromised population.   

## 2021-10-16 NOTE — Assessment & Plan Note (Signed)
She has a history of hypomagnesemia and hypokalemia but not symptomatic ?Observe only ?

## 2021-10-16 NOTE — Assessment & Plan Note (Signed)
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 

## 2021-10-16 NOTE — Assessment & Plan Note (Signed)
I have reviewed multiple CT imaging with the patient ?She has no signs of cancer recurrence after all these years ?Plan CT imaging once a year only ?She will continue trastuzumab indefinitely ?Her next echocardiogram will be due in June ?

## 2021-10-16 NOTE — Progress Notes (Signed)
[facility] ?OFFICE PROGRESS NOTE ? ?Patient Care Team: ?Burnard Bunting, [name] ? ?ASSESSMENT & PLAN:  ?Endometrial cancer ([place]) ?I have reviewed multiple CT imaging with the patient ?She has no signs of cancer recurrence after all these years ?Plan CT imaging once a year only ?She will continue trastuzumab indefinitely ?Her next echocardiogram will be due in June ? ?CKD (chronic kidney disease), stage III ([place]) ?Renal function is stable. Monitor closely ?She was seen by nephrologist recently ? ?Anemia in neoplastic disease ?She has multifactorial anemia, anemia secondary to prior chemotherapy as well as chronic kidney disease ?Anemia is improving/stable ?Recent vitamin B12 level was adequate ?She is not symptomatic ?Observe only ? ?Hypomagnesemia ?She has a history of hypomagnesemia and hypokalemia but not symptomatic ?Observe only ? ?No orders of the defined types were placed in this encounter. ? ? ?All questions were answered. The patient knows to call the clinic with any problems, questions or concerns. ?The total time spent in the appointment was 30 minutes encounter with patients including review of chart and various tests results, discussions about plan of care and coordination of care plan ?  ?[name], MD ?10/16/2021 10:40 AM ? ?INTERVAL HISTORY: ?Please see below for problem oriented charting. ?she returns for treatment follow-up with maintenance trastuzumab for recurrent metastatic uterine cancer, HER2 positive disease ?She is doing well ?No side effects from treatment ?No new lymphadenopathy ? ?REVIEW OF SYSTEMS:   ?Constitutional: Denies fevers, chills or abnormal weight loss ?Eyes: Denies blurriness of vision ?Ears, nose, mouth, throat, and face: Denies mucositis or sore throat ?Respiratory: Denies cough, dyspnea or wheezes ?Cardiovascular: Denies palpitation, chest discomfort or lower extremity swelling ?Gastrointestinal:  Denies nausea, heartburn or  change in bowel habits ?Skin: Denies abnormal skin rashes ?Lymphatics: Denies new lymphadenopathy or easy bruising ?Neurological:Denies numbness, tingling or new weaknesses ?Behavioral/Psych: Mood is stable, no new changes  ?All other systems were reviewed with the patient and are negative. ? ?I have reviewed the past medical history, past surgical history, social history and family history with the patient and they are unchanged from previous note. ? ?ALLERGIES:  has No Known Allergies. ? ?MEDICATIONS:  ?Current Outpatient Medications  ?Medication Sig Dispense Refill  ? acetaminophen (TYLENOL) 500 MG tablet Take 1,000 mg by mouth every 6 (six) hours as needed.    ? aspirin EC 81 MG tablet Take 81 mg by mouth daily.    ? atorvastatin (LIPITOR) 20 MG tablet Take 20 mg by mouth every evening.     ? Calcium Carbonate-Vitamin D (CALCIUM-VITAMIN D) 500-200 MG-UNIT per tablet Take 1 tablet by mouth daily.    ? citalopram (CELEXA) 20 MG tablet Take 20 mg by mouth at bedtime.     ? estradiol (ESTRACE) 0.1 MG/GM vaginal cream PLACE 1 APPLICATORFUL VAGINALLY 3 TIMES A WEEK 42.5 g 12  ? gabapentin (NEURONTIN) 600 MG tablet Take 600 mg by mouth at bedtime as needed.    ? levothyroxine (SYNTHROID, LEVOTHROID) 100 MCG tablet Take 100 mcg by mouth daily before breakfast.     ? lidocaine-prilocaine (EMLA) cream Apply to affected area once 30 g 3  ? losartan-hydrochlorothiazide (HYZAAR) 100-25 MG tablet     ? magnesium oxide (MAG-OX) 400 (241.3 Mg) MG tablet Take 1 tablet (400 mg total) by mouth daily. 30 tablet 11  ? metoprolol succinate (TOPROL-XL) 25 MG 24 hr tablet Take 25 mg by mouth daily.    ? Multiple Vitamin (MULTIVITAMIN WITH MINERALS) TABS Take 1  tablet by mouth daily.    ? ondansetron (ZOFRAN) 8 MG tablet Take 1 tablet (8 mg total) by mouth every 8 (eight) hours as needed. Start on the third day after chemotherapy. 30 tablet 1  ? valACYclovir (VALTREX) 1000 MG tablet valacyclovir 1 gram tablet    ? ?No current  facility-administered medications for this visit.  ? ?Facility-Administered Medications Ordered in Other Visits  ?Medication Dose Route Frequency Provider Last Rate Last Admin  ? sodium chloride flush (NS) 0.9 % injection 10 mL  10 mL Intracatheter PRN [name], MD   10 mL at 09/04/21 1522  ? ? ?SUMMARY OF ONCOLOGIC HISTORY: ?Oncology History Overview Note  ?[name]  has a remote history of left  breast cancer at age 60 but received BRCA testing approximately 5 years ago which was negative. Her cancer was treated with surgery but no radiation or chemotherapy.  ? 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 ? ?  ?Endometrial cancer [facility])  ?08/29/2016 Pathology Results  ? Endometrium, biopsy ?- HIGH GRADE ENDOMETRIAL CARCINOMA, SEE COMMENT. ?Microscopic Comment ?The sections show multiple fragments of adenocarcinoma displaying glandular and papillary patterns associated with high grade cytomorphology characterized by nuclear pleomorphism, prominent nucleoli and brisk mitosis. Immunohistochemical stains show that the tumor cells are positive for vimentin, p16, p53 with increased Ki-67 expression. Estrogen and progesterone receptor stains show patchy weak positivity. No significant positivity is seen with CEA. The findings are consistent with high grade endometrial carcinoma and the overall morphology and phenotypic features favor serous carcinoma. ?  ?08/29/2016 Initial Diagnosis  ? She presented with postmenopausal bleeding ?  ?10/03/2016 Imaging  ? CT C/A/P 09/2016 ?IMPRESSION: ?1. Thickening of the endometrial canal up to 19 mm in fundus, presumably corresponding to the patient's reported endometrial carcinoma. ?2. Multiple tiny pulmonary nodules scattered throughout the lungs bilaterally measuring 4 mm or less in size. Nodules of this size are typically considered statistically likely benign. In the setting of known primary malignancy, metastatic disease to the lungs is not  excluded, but is not strongly favored on today's examination. Attention on followup studies is recommended to ensure the stability or resolution of these nodules. ?3. Subcentimeter low-attenuation lesion in the central aspect of segment 8 of the liver is too small to characterize. This is statistically likely a tiny cyst, but warrants attention on follow-up studies to exclude the possibility of a solitary hepatic metastasis. ?4. 1.5 x 1.5 x 1.7 cm well-circumscribed lesion in the proximal stomach. This is of uncertain etiology and significance, and could represent a benign gastric polyp, however, further evaluation with nonemergent endoscopy is suggested in the near future for further evaluation. ?5. **An incidental finding of potential clinical significance has been found. 1.1 x 1.6 cm thyroid nodule in the inferior aspect of the right lobe of the thyroid gland. Follow-up evaluation with nonemergent thyroid ultrasound is recommended in the near future to better evaluate this finding. This recommendation follows ACR consensuss guidelines: Managing Incidental Thyroid Nodules Detected on Imaging: White Paper of the ACR Incidental Thyroid Findings Committee. J Am Coll Radiol 2015;12(2):143-150.** ?6. Aortic atherosclerosis, in addition to left anterior descending coronary artery disease ?  ?10/22/2016 Surgery  ? Robotic assisted total hysterectomy, BSO and bilateral pelvic lymphadenectomy ? ?Final pathology revealed a 3cm polyp containing serous carcinoma but with no myometrial invasion, no LVSI and negative nodes. ? ?Stage IA Uterine serous cancer ?  ?10/22/2016 Pathology Results  ? 1. Lymph nodes, regional resection, right  pelvic ?- SIX BENIGN LYMPH NODES (0/6). ?2. Lymph nodes, regional resection, left pelvic ?- SEVEN BENIGN LYMPH NODES (0/7). ?3. Uterus +/- tubes/ovaries, neoplastic, with right ovary and fallopian tube ?ENDOMYOMETRIUM ?- SEROUS CARCINOMA ARISING WITHIN AN ENDOMETRIAL POLYP ?- NO MYOMETRIAL INVASION  IDENTIFIED ?- ADENOMYOSIS ?- LEIOMYOMA (1 CM) ?- SEE ONCOLOGY TABLE AND COMMENT ?CERVIX ?- CARCINOMA FOCALLY INVOLVES ENDOCERVICAL GLANDS ?- NABOTHIAN CYSTS ?RIGHT ADNEXA ?- BENIGN OVARY AND FALLOPIAN TUBE ?- NO CARCI

## 2021-10-17 ENCOUNTER — Telehealth: Payer: Self-pay | Admitting: Oncology

## 2021-10-17 NOTE — Telephone Encounter (Signed)
Laraya called back and confirmed the appointment with Dr. Berline Lopes on 12/31/2021. ?

## 2021-10-17 NOTE — Telephone Encounter (Signed)
Left a message with appointment to see Dr. Berline Lopes on 12/31/21 at 3:15.  Requested a return call to confirm. ?

## 2021-10-26 DIAGNOSIS — R739 Hyperglycemia, unspecified: Secondary | ICD-10-CM | POA: Diagnosis not present

## 2021-10-26 DIAGNOSIS — E785 Hyperlipidemia, unspecified: Secondary | ICD-10-CM | POA: Diagnosis not present

## 2021-10-26 DIAGNOSIS — E039 Hypothyroidism, unspecified: Secondary | ICD-10-CM | POA: Diagnosis not present

## 2021-11-01 DIAGNOSIS — Z6824 Body mass index (BMI) 24.0-24.9, adult: Secondary | ICD-10-CM | POA: Diagnosis not present

## 2021-11-01 DIAGNOSIS — I10 Essential (primary) hypertension: Secondary | ICD-10-CM | POA: Diagnosis not present

## 2021-11-01 DIAGNOSIS — M8580 Other specified disorders of bone density and structure, unspecified site: Secondary | ICD-10-CM | POA: Diagnosis not present

## 2021-11-01 DIAGNOSIS — C541 Malignant neoplasm of endometrium: Secondary | ICD-10-CM | POA: Diagnosis not present

## 2021-11-01 DIAGNOSIS — E785 Hyperlipidemia, unspecified: Secondary | ICD-10-CM | POA: Diagnosis not present

## 2021-11-01 DIAGNOSIS — N1832 Chronic kidney disease, stage 3b: Secondary | ICD-10-CM | POA: Diagnosis not present

## 2021-11-01 DIAGNOSIS — R739 Hyperglycemia, unspecified: Secondary | ICD-10-CM | POA: Diagnosis not present

## 2021-11-01 DIAGNOSIS — Z0000 Encounter for general adult medical examination without abnormal findings: Secondary | ICD-10-CM | POA: Diagnosis not present

## 2021-11-01 DIAGNOSIS — K219 Gastro-esophageal reflux disease without esophagitis: Secondary | ICD-10-CM | POA: Diagnosis not present

## 2021-11-01 DIAGNOSIS — E039 Hypothyroidism, unspecified: Secondary | ICD-10-CM | POA: Diagnosis not present

## 2021-11-05 DIAGNOSIS — H35372 Puckering of macula, left eye: Secondary | ICD-10-CM | POA: Diagnosis not present

## 2021-11-05 DIAGNOSIS — H59811 Chorioretinal scars after surgery for detachment, right eye: Secondary | ICD-10-CM | POA: Diagnosis not present

## 2021-11-05 DIAGNOSIS — H33011 Retinal detachment with single break, right eye: Secondary | ICD-10-CM | POA: Diagnosis not present

## 2021-11-05 DIAGNOSIS — H353111 Nonexudative age-related macular degeneration, right eye, early dry stage: Secondary | ICD-10-CM | POA: Diagnosis not present

## 2021-11-05 DIAGNOSIS — H35363 Drusen (degenerative) of macula, bilateral: Secondary | ICD-10-CM | POA: Diagnosis not present

## 2021-11-06 ENCOUNTER — Other Ambulatory Visit: Payer: Self-pay

## 2021-11-06 ENCOUNTER — Inpatient Hospital Stay: Payer: Medicare HMO

## 2021-11-06 VITALS — BP 154/67 | HR 64 | Temp 97.7°F | Resp 18 | Ht 62.0 in | Wt 129.5 lb

## 2021-11-06 DIAGNOSIS — D631 Anemia in chronic kidney disease: Secondary | ICD-10-CM | POA: Diagnosis not present

## 2021-11-06 DIAGNOSIS — Z7982 Long term (current) use of aspirin: Secondary | ICD-10-CM | POA: Diagnosis not present

## 2021-11-06 DIAGNOSIS — C541 Malignant neoplasm of endometrium: Secondary | ICD-10-CM | POA: Diagnosis not present

## 2021-11-06 DIAGNOSIS — Z9221 Personal history of antineoplastic chemotherapy: Secondary | ICD-10-CM | POA: Diagnosis not present

## 2021-11-06 DIAGNOSIS — Z79899 Other long term (current) drug therapy: Secondary | ICD-10-CM | POA: Diagnosis not present

## 2021-11-06 DIAGNOSIS — Z5112 Encounter for antineoplastic immunotherapy: Secondary | ICD-10-CM | POA: Diagnosis not present

## 2021-11-06 DIAGNOSIS — N183 Chronic kidney disease, stage 3 unspecified: Secondary | ICD-10-CM | POA: Diagnosis not present

## 2021-11-06 DIAGNOSIS — C779 Secondary and unspecified malignant neoplasm of lymph node, unspecified: Secondary | ICD-10-CM | POA: Diagnosis not present

## 2021-11-06 DIAGNOSIS — Z90710 Acquired absence of both cervix and uterus: Secondary | ICD-10-CM | POA: Diagnosis not present

## 2021-11-06 MED ORDER — DIPHENHYDRAMINE HCL 25 MG PO CAPS
25.0000 mg | ORAL_CAPSULE | Freq: Once | ORAL | Status: AC
  Administered 2021-11-06: 25 mg via ORAL
  Filled 2021-11-06: qty 1

## 2021-11-06 MED ORDER — SODIUM CHLORIDE 0.9% FLUSH
10.0000 mL | INTRAVENOUS | Status: DC | PRN
  Administered 2021-11-06: 10 mL

## 2021-11-06 MED ORDER — HEPARIN SOD (PORK) LOCK FLUSH 100 UNIT/ML IV SOLN
500.0000 [IU] | Freq: Once | INTRAVENOUS | Status: AC | PRN
  Administered 2021-11-06: 500 [IU]

## 2021-11-06 MED ORDER — SODIUM CHLORIDE 0.9 % IV SOLN: Freq: Once | INTRAVENOUS | Status: AC

## 2021-11-06 MED ORDER — ACETAMINOPHEN 325 MG PO TABS
650.0000 mg | ORAL_TABLET | Freq: Once | ORAL | Status: AC
  Administered 2021-11-06: 650 mg via ORAL
  Filled 2021-11-06: qty 2

## 2021-11-06 MED ORDER — TRASTUZUMAB-ANNS CHEMO 150 MG IV SOLR
6.0000 mg/kg | Freq: Once | INTRAVENOUS | Status: AC
  Administered 2021-11-06: 378 mg via INTRAVENOUS
  Filled 2021-11-06: qty 18

## 2021-11-06 NOTE — Patient Instructions (Signed)
[facility]]   Discharge Instructions: Thank you for choosing [facility] to provide your oncology and hematology care.   If you have a lab appointment with the Cancer Center, please go directly to the Cancer Center and check in at the registration area.   Wear comfortable clothing and clothing appropriate for easy access to any Portacath or PICC line.   We strive to give you quality time with your provider. You may need to reschedule your appointment if you arrive late (15 or more minutes).  Arriving late affects you and other patients whose appointments are after yours.  Also, if you miss three or more appointments without notifying the office, you may be dismissed from the clinic at the provider's discretion.      For prescription refill requests, have your pharmacy contact our office and allow 72 hours for refills to be completed.    Today you received the following chemotherapy and/or immunotherapy agents: Trastuzumab ([name])       To help prevent nausea and vomiting after your treatment, we encourage you to take your nausea medication as directed.  BELOW ARE SYMPTOMS THAT SHOULD BE REPORTED IMMEDIATELY: *FEVER GREATER THAN 100.4 F (38 C) OR HIGHER *CHILLS OR SWEATING *NAUSEA AND VOMITING THAT IS NOT CONTROLLED WITH YOUR NAUSEA MEDICATION *UNUSUAL SHORTNESS OF BREATH *UNUSUAL BRUISING OR BLEEDING *URINARY PROBLEMS (pain or burning when urinating, or frequent urination) *BOWEL PROBLEMS (unusual diarrhea, constipation, pain near the anus) TENDERNESS IN MOUTH AND THROAT WITH OR WITHOUT PRESENCE OF ULCERS (sore throat, sores in mouth, or a toothache) UNUSUAL RASH, SWELLING OR PAIN  UNUSUAL VAGINAL DISCHARGE OR ITCHING   Items with * indicate a potential emergency and should be followed up as soon as possible or go to the Emergency Department if any problems should occur.  Please show the CHEMOTHERAPY ALERT CARD or IMMUNOTHERAPY ALERT  CARD at check-in to the Emergency Department and triage nurse.  Should you have questions after your visit or need to cancel or reschedule your appointment, please contact [facility]  Dept: [phone]  and follow the prompts.  Office hours are 8:00 a.m. to 4:30 p.m. Monday - Friday. Please note that voicemails left after 4:00 p.m. may not be returned until the following business day.  We are closed weekends and major holidays. You have access to a nurse at all times for urgent questions. Please call the main number to the clinic Dept: [phone] and follow the prompts.   For any non-urgent questions, you may also contact your provider using MyChart. We now offer e-Visits for anyone 18 and older to request care online for non-urgent symptoms. For details visit [email or web address].   Also download the MyChart app! Go to the app store, search "MyChart", open the app, select [facility], and log in with your MyChart username and password.  Due to Covid, a mask is required upon entering the hospital/clinic. If you do not have a mask, one will be given to you upon arrival. For doctor visits, patients may have 1 support person aged 18 or older with them. For treatment visits, patients cannot have anyone with them due to current Covid guidelines and our immunocompromised population.  

## 2021-11-08 DIAGNOSIS — H35363 Drusen (degenerative) of macula, bilateral: Secondary | ICD-10-CM | POA: Diagnosis not present

## 2021-11-08 DIAGNOSIS — H35372 Puckering of macula, left eye: Secondary | ICD-10-CM | POA: Diagnosis not present

## 2021-11-08 DIAGNOSIS — H5315 Visual distortions of shape and size: Secondary | ICD-10-CM | POA: Diagnosis not present

## 2021-11-08 DIAGNOSIS — H26492 Other secondary cataract, left eye: Secondary | ICD-10-CM | POA: Diagnosis not present

## 2021-11-08 DIAGNOSIS — H43812 Vitreous degeneration, left eye: Secondary | ICD-10-CM | POA: Diagnosis not present

## 2021-11-08 DIAGNOSIS — H353131 Nonexudative age-related macular degeneration, bilateral, early dry stage: Secondary | ICD-10-CM | POA: Diagnosis not present

## 2021-11-12 DIAGNOSIS — L570 Actinic keratosis: Secondary | ICD-10-CM | POA: Diagnosis not present

## 2021-11-12 DIAGNOSIS — D0471 Carcinoma in situ of skin of right lower limb, including hip: Secondary | ICD-10-CM | POA: Diagnosis not present

## 2021-11-12 DIAGNOSIS — D485 Neoplasm of uncertain behavior of skin: Secondary | ICD-10-CM | POA: Diagnosis not present

## 2021-11-13 DIAGNOSIS — H35372 Puckering of macula, left eye: Secondary | ICD-10-CM | POA: Diagnosis not present

## 2021-11-13 DIAGNOSIS — H33012 Retinal detachment with single break, left eye: Secondary | ICD-10-CM | POA: Diagnosis not present

## 2021-11-21 DIAGNOSIS — H35372 Puckering of macula, left eye: Secondary | ICD-10-CM | POA: Diagnosis not present

## 2021-11-21 DIAGNOSIS — H33012 Retinal detachment with single break, left eye: Secondary | ICD-10-CM | POA: Diagnosis not present

## 2021-11-27 ENCOUNTER — Inpatient Hospital Stay: Payer: Medicare HMO

## 2021-11-27 ENCOUNTER — Encounter: Payer: Self-pay | Admitting: Hematology and Oncology

## 2021-11-27 ENCOUNTER — Inpatient Hospital Stay: Payer: Medicare HMO | Attending: Gynecologic Oncology | Admitting: Hematology and Oncology

## 2021-11-27 ENCOUNTER — Other Ambulatory Visit: Payer: Self-pay

## 2021-11-27 VITALS — BP 165/66 | HR 66 | Resp 18 | Ht 62.0 in | Wt 130.0 lb

## 2021-11-27 DIAGNOSIS — I1310 Hypertensive heart and chronic kidney disease without heart failure, with stage 1 through stage 4 chronic kidney disease, or unspecified chronic kidney disease: Secondary | ICD-10-CM | POA: Insufficient documentation

## 2021-11-27 DIAGNOSIS — C541 Malignant neoplasm of endometrium: Secondary | ICD-10-CM

## 2021-11-27 DIAGNOSIS — Z79899 Other long term (current) drug therapy: Secondary | ICD-10-CM | POA: Diagnosis not present

## 2021-11-27 DIAGNOSIS — I10 Essential (primary) hypertension: Secondary | ICD-10-CM

## 2021-11-27 DIAGNOSIS — Z90710 Acquired absence of both cervix and uterus: Secondary | ICD-10-CM | POA: Insufficient documentation

## 2021-11-27 DIAGNOSIS — N183 Chronic kidney disease, stage 3 unspecified: Secondary | ICD-10-CM | POA: Diagnosis not present

## 2021-11-27 DIAGNOSIS — C779 Secondary and unspecified malignant neoplasm of lymph node, unspecified: Secondary | ICD-10-CM | POA: Diagnosis present

## 2021-11-27 DIAGNOSIS — Z7982 Long term (current) use of aspirin: Secondary | ICD-10-CM | POA: Diagnosis not present

## 2021-11-27 DIAGNOSIS — Z5112 Encounter for antineoplastic immunotherapy: Secondary | ICD-10-CM | POA: Diagnosis present

## 2021-11-27 LAB — CBC WITH DIFFERENTIAL/PLATELET
Abs Immature Granulocytes: 0.03 10*3/uL (ref 0.00–0.07)
Basophils Absolute: 0.1 10*3/uL (ref 0.0–0.1)
Basophils Relative: 2 %
Eosinophils Absolute: 0.2 10*3/uL (ref 0.0–0.5)
Eosinophils Relative: 2 %
HCT: 31.9 % — ABNORMAL LOW (ref 36.0–46.0)
Hemoglobin: 11.4 g/dL — ABNORMAL LOW (ref 12.0–15.0)
Immature Granulocytes: 1 %
Lymphocytes Relative: 25 %
Lymphs Abs: 1.7 10*3/uL (ref 0.7–4.0)
MCH: 31.8 pg (ref 26.0–34.0)
MCHC: 35.7 g/dL (ref 30.0–36.0)
MCV: 89.1 fL (ref 80.0–100.0)
Monocytes Absolute: 0.7 10*3/uL (ref 0.1–1.0)
Monocytes Relative: 10 %
Neutro Abs: 4 10*3/uL (ref 1.7–7.7)
Neutrophils Relative %: 60 %
Platelets: 228 10*3/uL (ref 150–400)
RBC: 3.58 MIL/uL — ABNORMAL LOW (ref 3.87–5.11)
RDW: 11.8 % (ref 11.5–15.5)
WBC: 6.6 10*3/uL (ref 4.0–10.5)
nRBC: 0 % (ref 0.0–0.2)

## 2021-11-27 LAB — COMPREHENSIVE METABOLIC PANEL
ALT: 13 U/L (ref 0–44)
AST: 18 U/L (ref 15–41)
Albumin: 4.1 g/dL (ref 3.5–5.0)
Alkaline Phosphatase: 62 U/L (ref 38–126)
Anion gap: 7 (ref 5–15)
BUN: 29 mg/dL — ABNORMAL HIGH (ref 8–23)
CO2: 28 mmol/L (ref 22–32)
Calcium: 10 mg/dL (ref 8.9–10.3)
Chloride: 99 mmol/L (ref 98–111)
Creatinine, Ser: 1.19 mg/dL — ABNORMAL HIGH (ref 0.44–1.00)
GFR, Estimated: 46 mL/min — ABNORMAL LOW (ref >60)
Glucose, Bld: 99 mg/dL (ref 70–99)
Potassium: 3.6 mmol/L (ref 3.5–5.1)
Sodium: 134 mmol/L — ABNORMAL LOW (ref 135–145)
Total Bilirubin: 0.3 mg/dL (ref 0.3–1.2)
Total Protein: 6.9 g/dL (ref 6.5–8.1)

## 2021-11-27 MED ORDER — HEPARIN SOD (PORK) LOCK FLUSH 100 UNIT/ML IV SOLN
500.0000 [IU] | Freq: Once | INTRAVENOUS | Status: AC | PRN
  Administered 2021-11-27: 500 [IU]

## 2021-11-27 MED ORDER — SODIUM CHLORIDE 0.9% FLUSH
10.0000 mL | Freq: Once | INTRAVENOUS | Status: AC
  Administered 2021-11-27: 10 mL

## 2021-11-27 MED ORDER — SODIUM CHLORIDE 0.9 % IV SOLN: Freq: Once | INTRAVENOUS | Status: AC

## 2021-11-27 MED ORDER — ACETAMINOPHEN 325 MG PO TABS
650.0000 mg | ORAL_TABLET | Freq: Once | ORAL | Status: AC
  Administered 2021-11-27: 650 mg via ORAL
  Filled 2021-11-27: qty 2

## 2021-11-27 MED ORDER — TRASTUZUMAB-ANNS CHEMO 150 MG IV SOLR
6.0000 mg/kg | Freq: Once | INTRAVENOUS | Status: AC
  Administered 2021-11-27: 357 mg via INTRAVENOUS
  Filled 2021-11-27: qty 17

## 2021-11-27 MED ORDER — SODIUM CHLORIDE 0.9% FLUSH
10.0000 mL | INTRAVENOUS | Status: DC | PRN
  Administered 2021-11-27: 10 mL

## 2021-11-27 MED ORDER — DIPHENHYDRAMINE HCL 25 MG PO CAPS
25.0000 mg | ORAL_CAPSULE | Freq: Once | ORAL | Status: AC
  Administered 2021-11-27: 25 mg via ORAL
  Filled 2021-11-27: qty 1

## 2021-11-27 NOTE — Assessment & Plan Note (Signed)
Renal function is stable. Monitor closely She was seen by nephrologist recently 

## 2021-11-27 NOTE — Progress Notes (Signed)
[facility] ?OFFICE PROGRESS NOTE ? ?Patient Care Team: ?Burnard Bunting, [name] ? ?ASSESSMENT & PLAN:  ?Endometrial cancer ([place]) ?Her last imaging study from April 2023 showed no evidence of disease ?She has no signs of cancer recurrence after all these years ?Plan CT imaging once a year only ?She will continue trastuzumab indefinitely ?Her next echocardiogram will be due in June ? ?CKD (chronic kidney disease), stage III ([place]) ?Renal function is stable. Monitor closely ?She was seen by nephrologist recently ? ?Essential hypertension ?She has mild intermittent elevated blood pressure could be due to whitecoat hypertension ?She is on antihypertensives ?She will continue monitoring and close follow-up with primary care doctor ? ?Orders Placed This Encounter  ?Procedures  ? ECHOCARDIOGRAM COMPLETE  ?  Standing Status:   Future  ?  Standing Expiration Date:   11/28/2022  ?  Order Specific Question:   Where should this test be performed  ?  Answer:   [facility]  ?  Order Specific Question:   Perflutren DEFINITY (image enhancing agent) should be administered unless hypersensitivity or allergy exist  ?  Answer:   Administer Perflutren  ?  Order Specific Question:   Reason for exam-Echo  ?  Answer:   Chemo  Z09  ? ? ?All questions were answered. The patient knows to call the clinic with any problems, questions or concerns. ?The total time spent in the appointment was 25 minutes encounter with patients including review of chart and various tests results, discussions about plan of care and coordination of care plan ?  ?[name], MD ?11/27/2021 9:38 AM ? ?INTERVAL HISTORY: ?Please see below for problem oriented charting. ?she returns for treatment follow-up on single agent [name] for maintenance therapy for HER2 positive metastatic uterine cancer ?She is doing well ?Denies signs or symptoms of congestive heart failure such as shortness of breath or leg edema ?No new  lymphadenopathy ? ?REVIEW OF SYSTEMS:   ?Constitutional: Denies fevers, chills or abnormal weight loss ?Eyes: Denies blurriness of vision ?Ears, nose, mouth, throat, and face: Denies mucositis or sore throat ?Respiratory: Denies cough, dyspnea or wheezes ?Cardiovascular: Denies palpitation, chest discomfort or lower extremity swelling ?Gastrointestinal:  Denies nausea, heartburn or change in bowel habits ?Skin: Denies abnormal skin rashes ?Lymphatics: Denies new lymphadenopathy or easy bruising ?Neurological:Denies numbness, tingling or new weaknesses ?Behavioral/Psych: Mood is stable, no new changes  ?All other systems were reviewed with the patient and are negative. ? ?I have reviewed the past medical history, past surgical history, social history and family history with the patient and they are unchanged from previous note. ? ?ALLERGIES:  has No Known Allergies. ? ?MEDICATIONS:  ?Current Outpatient Medications  ?Medication Sig Dispense Refill  ? acetaminophen (TYLENOL) 500 MG tablet Take 1,000 mg by mouth every 6 (six) hours as needed.    ? aspirin EC 81 MG tablet Take 81 mg by mouth daily.    ? atorvastatin (LIPITOR) 20 MG tablet Take 20 mg by mouth every evening.     ? Calcium Carbonate-Vitamin D (CALCIUM-VITAMIN D) 500-200 MG-UNIT per tablet Take 1 tablet by mouth daily.    ? citalopram (CELEXA) 20 MG tablet Take 20 mg by mouth at bedtime.     ? estradiol (ESTRACE) 0.1 MG/GM vaginal cream PLACE 1 APPLICATORFUL VAGINALLY 3 TIMES A WEEK 42.5 g 12  ? gabapentin (NEURONTIN) 600 MG tablet Take 600 mg by mouth at bedtime as needed.    ? levothyroxine (SYNTHROID, LEVOTHROID) 100 MCG  tablet Take 100 mcg by mouth daily before breakfast.     ? lidocaine-prilocaine (EMLA) cream Apply to affected area once 30 g 3  ? losartan-hydrochlorothiazide (HYZAAR) 100-25 MG tablet     ? magnesium oxide (MAG-OX) 400 (241.3 Mg) MG tablet Take 1 tablet (400 mg total) by mouth daily. 30 tablet 11  ? metoprolol succinate (TOPROL-XL) 25  MG 24 hr tablet Take 25 mg by mouth daily.    ? Multiple Vitamin (MULTIVITAMIN WITH MINERALS) TABS Take 1 tablet by mouth daily.    ? ondansetron (ZOFRAN) 8 MG tablet Take 1 tablet (8 mg total) by mouth every 8 (eight) hours as needed. Start on the third day after chemotherapy. 30 tablet 1  ? valACYclovir (VALTREX) 1000 MG tablet valacyclovir 1 gram tablet    ? ?No current facility-administered medications for this visit.  ? ?Facility-Administered Medications Ordered in Other Visits  ?Medication Dose Route Frequency Provider Last Rate Last Admin  ? sodium chloride flush (NS) 0.9 % injection 10 mL  10 mL Intracatheter PRN [name], MD   10 mL at 09/04/21 1522  ? ? ?SUMMARY OF ONCOLOGIC HISTORY: ?Oncology History Overview Note  ?Vicki Cervantes  has a remote history of left  breast cancer at age 34 but received BRCA testing approximately 5 years ago which was negative. Her cancer was treated with surgery but no radiation or chemotherapy.  ? 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 ? ? ?  ?Endometrial cancer [facility])  ?08/29/2016 Pathology Results  ? Endometrium, biopsy ?- HIGH GRADE ENDOMETRIAL CARCINOMA, SEE COMMENT. ?Microscopic Comment ?The sections show multiple fragments of adenocarcinoma displaying glandular and papillary patterns associated with high grade cytomorphology characterized by nuclear pleomorphism, prominent nucleoli and brisk mitosis. Immunohistochemical stains show that the tumor cells are positive for vimentin, p16, p53 with increased Ki-67 expression. Estrogen and progesterone receptor stains show patchy weak positivity. No significant positivity is seen with CEA. The findings are consistent with high grade endometrial carcinoma and the overall morphology and phenotypic features favor serous carcinoma. ? ?  ?08/29/2016 Initial Diagnosis  ? She presented with postmenopausal bleeding ? ?  ?10/03/2016 Imaging  ? CT C/A/P 09/2016 ?IMPRESSION: ?1. Thickening of the  endometrial canal up to 19 mm in fundus, presumably corresponding to the patient's reported endometrial carcinoma. ?2. Multiple tiny pulmonary nodules scattered throughout the lungs bilaterally measuring 4 mm or less in size. Nodules of this size are typically considered statistically likely benign. In the setting of known primary malignancy, metastatic disease to the lungs is not excluded, but is not strongly favored on today's examination. Attention on followup studies is recommended to ensure the stability or resolution of these nodules. ?3. Subcentimeter low-attenuation lesion in the central aspect of segment 8 of the liver is too small to characterize. This is statistically likely a tiny cyst, but warrants attention on follow-up studies to exclude the possibility of a solitary hepatic metastasis. ?4. 1.5 x 1.5 x 1.7 cm well-circumscribed lesion in the proximal stomach. This is of uncertain etiology and significance, and could represent a benign gastric polyp, however, further evaluation with nonemergent endoscopy is suggested in the near future for further evaluation. ?5. **An incidental finding of potential clinical significance has been found. 1.1 x 1.6 cm thyroid nodule in the inferior aspect of the right lobe of the thyroid gland. Follow-up evaluation with nonemergent thyroid ultrasound is recommended in the near future to better evaluate this finding. This recommendation follows ACR consensuss  guidelines: Managing Incidental Thyroid Nodules Detected on Imaging: White Paper of the ACR Incidental Thyroid Findings Committee. J Am Coll Radiol 2015;12(2):143-150.** ?6. Aortic atherosclerosis, in addition to left anterior descending coronary artery disease ? ?  ?10/22/2016 Surgery  ? Robotic assisted total hysterectomy, BSO and bilateral pelvic lymphadenectomy ? ?Final pathology revealed a 3cm polyp containing serous carcinoma but with no myometrial invasion, no LVSI and negative nodes. ? ?Stage IA Uterine serous  cancer ? ?  ?10/22/2016 Pathology Results  ? 1. Lymph nodes, regional resection, right pelvic ?- SIX BENIGN LYMPH NODES (0/6). ?2. Lymph nodes, regional resection, left pelvic ?- SEVEN BENIGN LYMPH NODES (0/7). ?3.

## 2021-11-27 NOTE — Assessment & Plan Note (Signed)
She has mild intermittent elevated blood pressure could be due to whitecoat hypertension ?She is on antihypertensives ?She will continue monitoring and close follow-up with primary care doctor ?

## 2021-11-27 NOTE — Assessment & Plan Note (Signed)
Her last imaging study from April 2023 showed no evidence of disease ?She has no signs of cancer recurrence after all these years ?Plan CT imaging once a year only ?She will continue trastuzumab indefinitely ?Her next echocardiogram will be due in June ?

## 2021-11-28 DIAGNOSIS — H33012 Retinal detachment with single break, left eye: Secondary | ICD-10-CM | POA: Diagnosis not present

## 2021-11-28 DIAGNOSIS — H35372 Puckering of macula, left eye: Secondary | ICD-10-CM | POA: Diagnosis not present

## 2021-12-04 DIAGNOSIS — L980 Pyogenic granuloma: Secondary | ICD-10-CM | POA: Diagnosis not present

## 2021-12-04 DIAGNOSIS — L03032 Cellulitis of left toe: Secondary | ICD-10-CM | POA: Diagnosis not present

## 2021-12-04 DIAGNOSIS — L601 Onycholysis: Secondary | ICD-10-CM | POA: Diagnosis not present

## 2021-12-04 DIAGNOSIS — L600 Ingrowing nail: Secondary | ICD-10-CM | POA: Diagnosis not present

## 2021-12-04 DIAGNOSIS — M79675 Pain in left toe(s): Secondary | ICD-10-CM | POA: Diagnosis not present

## 2021-12-12 DIAGNOSIS — Z961 Presence of intraocular lens: Secondary | ICD-10-CM | POA: Diagnosis not present

## 2021-12-12 DIAGNOSIS — D0471 Carcinoma in situ of skin of right lower limb, including hip: Secondary | ICD-10-CM | POA: Diagnosis not present

## 2021-12-12 DIAGNOSIS — H52203 Unspecified astigmatism, bilateral: Secondary | ICD-10-CM | POA: Diagnosis not present

## 2021-12-12 DIAGNOSIS — L820 Inflamed seborrheic keratosis: Secondary | ICD-10-CM | POA: Diagnosis not present

## 2021-12-12 DIAGNOSIS — H353132 Nonexudative age-related macular degeneration, bilateral, intermediate dry stage: Secondary | ICD-10-CM | POA: Diagnosis not present

## 2021-12-17 DIAGNOSIS — H35372 Puckering of macula, left eye: Secondary | ICD-10-CM | POA: Diagnosis not present

## 2021-12-17 DIAGNOSIS — H33012 Retinal detachment with single break, left eye: Secondary | ICD-10-CM | POA: Diagnosis not present

## 2021-12-18 ENCOUNTER — Other Ambulatory Visit: Payer: Self-pay

## 2021-12-18 ENCOUNTER — Inpatient Hospital Stay: Payer: Medicare HMO | Attending: Gynecologic Oncology

## 2021-12-18 VITALS — BP 147/64 | HR 70 | Temp 97.7°F | Resp 18 | Wt 131.5 lb

## 2021-12-18 DIAGNOSIS — Z79899 Other long term (current) drug therapy: Secondary | ICD-10-CM | POA: Insufficient documentation

## 2021-12-18 DIAGNOSIS — Z79890 Hormone replacement therapy (postmenopausal): Secondary | ICD-10-CM | POA: Diagnosis not present

## 2021-12-18 DIAGNOSIS — Z9221 Personal history of antineoplastic chemotherapy: Secondary | ICD-10-CM | POA: Insufficient documentation

## 2021-12-18 DIAGNOSIS — Z90710 Acquired absence of both cervix and uterus: Secondary | ICD-10-CM | POA: Diagnosis not present

## 2021-12-18 DIAGNOSIS — Z5112 Encounter for antineoplastic immunotherapy: Secondary | ICD-10-CM | POA: Insufficient documentation

## 2021-12-18 DIAGNOSIS — Z7982 Long term (current) use of aspirin: Secondary | ICD-10-CM | POA: Insufficient documentation

## 2021-12-18 DIAGNOSIS — Z90721 Acquired absence of ovaries, unilateral: Secondary | ICD-10-CM | POA: Insufficient documentation

## 2021-12-18 DIAGNOSIS — C779 Secondary and unspecified malignant neoplasm of lymph node, unspecified: Secondary | ICD-10-CM | POA: Diagnosis present

## 2021-12-18 DIAGNOSIS — Z853 Personal history of malignant neoplasm of breast: Secondary | ICD-10-CM | POA: Insufficient documentation

## 2021-12-18 DIAGNOSIS — Z9012 Acquired absence of left breast and nipple: Secondary | ICD-10-CM | POA: Insufficient documentation

## 2021-12-18 DIAGNOSIS — C541 Malignant neoplasm of endometrium: Secondary | ICD-10-CM | POA: Insufficient documentation

## 2021-12-18 MED ORDER — ACETAMINOPHEN 325 MG PO TABS
650.0000 mg | ORAL_TABLET | Freq: Once | ORAL | Status: AC
  Administered 2021-12-18: 650 mg via ORAL
  Filled 2021-12-18: qty 2

## 2021-12-18 MED ORDER — SODIUM CHLORIDE 0.9 % IV SOLN: Freq: Once | INTRAVENOUS | Status: AC

## 2021-12-18 MED ORDER — TRASTUZUMAB-ANNS CHEMO 150 MG IV SOLR
6.0000 mg/kg | Freq: Once | INTRAVENOUS | Status: AC
  Administered 2021-12-18: 357 mg via INTRAVENOUS
  Filled 2021-12-18: qty 17

## 2021-12-18 MED ORDER — DIPHENHYDRAMINE HCL 25 MG PO CAPS
25.0000 mg | ORAL_CAPSULE | Freq: Once | ORAL | Status: AC
  Administered 2021-12-18: 25 mg via ORAL
  Filled 2021-12-18: qty 1

## 2021-12-18 MED ORDER — SODIUM CHLORIDE 0.9% FLUSH
10.0000 mL | INTRAVENOUS | Status: DC | PRN
  Administered 2021-12-18: 10 mL

## 2021-12-18 MED ORDER — HEPARIN SOD (PORK) LOCK FLUSH 100 UNIT/ML IV SOLN
500.0000 [IU] | Freq: Once | INTRAVENOUS | Status: AC | PRN
  Administered 2021-12-18: 500 [IU]

## 2021-12-18 NOTE — Patient Instructions (Signed)
[facility]]   Discharge Instructions: Thank you for choosing [facility] to provide your oncology and hematology care.   If you have a lab appointment with the Cancer Center, please go directly to the Cancer Center and check in at the registration area.   Wear comfortable clothing and clothing appropriate for easy access to any Portacath or PICC line.   We strive to give you quality time with your provider. You may need to reschedule your appointment if you arrive late (15 or more minutes).  Arriving late affects you and other patients whose appointments are after yours.  Also, if you miss three or more appointments without notifying the office, you may be dismissed from the clinic at the provider's discretion.      For prescription refill requests, have your pharmacy contact our office and allow 72 hours for refills to be completed.    Today you received the following chemotherapy and/or immunotherapy agents: Trastuzumab ([name])       To help prevent nausea and vomiting after your treatment, we encourage you to take your nausea medication as directed.  BELOW ARE SYMPTOMS THAT SHOULD BE REPORTED IMMEDIATELY: *FEVER GREATER THAN 100.4 F (38 C) OR HIGHER *CHILLS OR SWEATING *NAUSEA AND VOMITING THAT IS NOT CONTROLLED WITH YOUR NAUSEA MEDICATION *UNUSUAL SHORTNESS OF BREATH *UNUSUAL BRUISING OR BLEEDING *URINARY PROBLEMS (pain or burning when urinating, or frequent urination) *BOWEL PROBLEMS (unusual diarrhea, constipation, pain near the anus) TENDERNESS IN MOUTH AND THROAT WITH OR WITHOUT PRESENCE OF ULCERS (sore throat, sores in mouth, or a toothache) UNUSUAL RASH, SWELLING OR PAIN  UNUSUAL VAGINAL DISCHARGE OR ITCHING   Items with * indicate a potential emergency and should be followed up as soon as possible or go to the Emergency Department if any problems should occur.  Please show the CHEMOTHERAPY ALERT CARD or IMMUNOTHERAPY ALERT  CARD at check-in to the Emergency Department and triage nurse.  Should you have questions after your visit or need to cancel or reschedule your appointment, please contact [facility]  Dept: [phone]  and follow the prompts.  Office hours are 8:00 a.m. to 4:30 p.m. Monday - Friday. Please note that voicemails left after 4:00 p.m. may not be returned until the following business day.  We are closed weekends and major holidays. You have access to a nurse at all times for urgent questions. Please call the main number to the clinic Dept: [phone] and follow the prompts.   For any non-urgent questions, you may also contact your provider using MyChart. We now offer e-Visits for anyone 18 and older to request care online for non-urgent symptoms. For details visit [email or web address].   Also download the MyChart app! Go to the app store, search "MyChart", open the app, select [facility], and log in with your MyChart username and password.  Due to Covid, a mask is required upon entering the hospital/clinic. If you do not have a mask, one will be given to you upon arrival. For doctor visits, patients may have 1 support Vicki Cervantes aged 18 or older with them. For treatment visits, patients cannot have anyone with them due to current Covid guidelines and our immunocompromised population.  

## 2021-12-25 DIAGNOSIS — L600 Ingrowing nail: Secondary | ICD-10-CM | POA: Diagnosis not present

## 2021-12-25 DIAGNOSIS — L03032 Cellulitis of left toe: Secondary | ICD-10-CM | POA: Diagnosis not present

## 2021-12-25 DIAGNOSIS — L601 Onycholysis: Secondary | ICD-10-CM | POA: Diagnosis not present

## 2021-12-25 DIAGNOSIS — M79675 Pain in left toe(s): Secondary | ICD-10-CM | POA: Diagnosis not present

## 2021-12-25 DIAGNOSIS — L980 Pyogenic granuloma: Secondary | ICD-10-CM | POA: Diagnosis not present

## 2021-12-27 ENCOUNTER — Encounter: Payer: Self-pay | Admitting: Gynecologic Oncology

## 2021-12-28 ENCOUNTER — Other Ambulatory Visit (HOSPITAL_COMMUNITY): Payer: Medicare HMO

## 2021-12-31 ENCOUNTER — Encounter: Payer: Self-pay | Admitting: Gynecologic Oncology

## 2021-12-31 ENCOUNTER — Ambulatory Visit (HOSPITAL_COMMUNITY)
Admission: RE | Admit: 2021-12-31 | Discharge: 2021-12-31 | Disposition: A | Discharge status: Home / Self Care | Payer: Medicare HMO | Source: Ambulatory Visit | Attending: Hematology and Oncology | Admitting: Hematology and Oncology

## 2021-12-31 ENCOUNTER — Inpatient Hospital Stay (HOSPITAL_BASED_OUTPATIENT_CLINIC_OR_DEPARTMENT_OTHER): Payer: Medicare HMO | Admitting: Gynecologic Oncology

## 2021-12-31 ENCOUNTER — Other Ambulatory Visit: Payer: Self-pay

## 2021-12-31 VITALS — BP 149/62 | HR 79 | Temp 98.3°F | Resp 16 | Ht 62.0 in | Wt 134.0 lb

## 2021-12-31 DIAGNOSIS — Z79899 Other long term (current) drug therapy: Secondary | ICD-10-CM | POA: Diagnosis not present

## 2021-12-31 DIAGNOSIS — C541 Malignant neoplasm of endometrium: Secondary | ICD-10-CM

## 2021-12-31 DIAGNOSIS — Z7982 Long term (current) use of aspirin: Secondary | ICD-10-CM | POA: Diagnosis not present

## 2021-12-31 DIAGNOSIS — Z9012 Acquired absence of left breast and nipple: Secondary | ICD-10-CM | POA: Diagnosis not present

## 2021-12-31 DIAGNOSIS — Z0189 Encounter for other specified special examinations: Secondary | ICD-10-CM

## 2021-12-31 DIAGNOSIS — Z01818 Encounter for other preprocedural examination: Secondary | ICD-10-CM | POA: Diagnosis not present

## 2021-12-31 DIAGNOSIS — C779 Secondary and unspecified malignant neoplasm of lymph node, unspecified: Secondary | ICD-10-CM | POA: Diagnosis not present

## 2021-12-31 DIAGNOSIS — I351 Nonrheumatic aortic (valve) insufficiency: Secondary | ICD-10-CM | POA: Insufficient documentation

## 2021-12-31 DIAGNOSIS — Z79890 Hormone replacement therapy (postmenopausal): Secondary | ICD-10-CM | POA: Diagnosis not present

## 2021-12-31 DIAGNOSIS — Z853 Personal history of malignant neoplasm of breast: Secondary | ICD-10-CM | POA: Diagnosis not present

## 2021-12-31 DIAGNOSIS — Z5112 Encounter for antineoplastic immunotherapy: Secondary | ICD-10-CM | POA: Diagnosis not present

## 2021-12-31 DIAGNOSIS — Z90721 Acquired absence of ovaries, unilateral: Secondary | ICD-10-CM | POA: Diagnosis not present

## 2021-12-31 DIAGNOSIS — I10 Essential (primary) hypertension: Secondary | ICD-10-CM | POA: Diagnosis not present

## 2021-12-31 DIAGNOSIS — Z90710 Acquired absence of both cervix and uterus: Secondary | ICD-10-CM | POA: Diagnosis not present

## 2021-12-31 DIAGNOSIS — Z9221 Personal history of antineoplastic chemotherapy: Secondary | ICD-10-CM | POA: Diagnosis not present

## 2021-12-31 LAB — ECHOCARDIOGRAM COMPLETE
AR max vel: 2.12 cm2
AV Area VTI: 2.28 cm2
AV Area mean vel: 2.13 cm2
AV Mean grad: 3 mmHg
AV Peak grad: 5.5 mmHg
Ao pk vel: 1.17 m/s
Area-P 1/2: 3.68 cm2
Calc EF: 57.7 %
P 1/2 time: 465 msec
S' Lateral: 2.7 cm
Single Plane A2C EF: 58.5 %
Single Plane A4C EF: 53.5 %

## 2021-12-31 NOTE — Patient Instructions (Signed)
This very nice to meet you.  I do not see or feel any evidence of cancer recurrence on your exam.  I had like to plan on visits every 6 months.  I will see you back in late December.  My schedule is not out past September, so please call back sometime in October or November to schedule a visit in December.  If you develop any new and concerning symptoms, such as vaginal bleeding or pelvic pain, please call to see me sooner.

## 2021-12-31 NOTE — Progress Notes (Signed)
Echocardiogram 2D Echocardiogram has been performed.  Vicki Cervantes 12/31/2021, 1:36 PM

## 2021-12-31 NOTE — Progress Notes (Signed)
Gynecologic Oncology Return Clinic Visit  12/31/21  Reason for Visit: surveillance in the setting of history of uterine cancer  Treatment History: Oncology History Overview Note  Vicki Cervantes  has a remote history of left  breast cancer at age 81 but received BRCA testing approximately 5 years ago which was negative. Her cancer was treated with surgery but no radiation or chemotherapy.   Serous endometrial cancer, MSI stable ER positive, PR neg, Her2/neu 3+      Endometrial cancer ([place])  08/29/2016 Pathology Results   Endometrium, biopsy - HIGH GRADE ENDOMETRIAL CARCINOMA, SEE COMMENT. Microscopic Comment The sections show multiple fragments of adenocarcinoma displaying glandular and papillary patterns associated with high grade cytomorphology characterized by nuclear pleomorphism, prominent nucleoli and brisk mitosis. Immunohistochemical stains show that the tumor cells are positive for vimentin, p16, p53 with increased Ki-67 expression. Estrogen and progesterone receptor stains show patchy weak positivity. No significant positivity is seen with CEA. The findings are consistent with high grade endometrial carcinoma and the overall morphology and phenotypic features favor serous carcinoma.   08/29/2016 Initial Diagnosis   She presented with postmenopausal bleeding   10/03/2016 Imaging   CT C/A/P 09/2016 IMPRESSION: 1. Thickening of the endometrial canal up to 19 mm in fundus, presumably corresponding to the patient's reported endometrial carcinoma. 2. Multiple tiny pulmonary nodules scattered throughout the lungs bilaterally measuring 4 mm or less in size. Nodules of this size are typically considered statistically likely benign. In the setting of known primary malignancy, metastatic disease to the lungs is not excluded, but is not strongly favored on today's examination. Attention on followup studies is recommended to ensure the stability or resolution of these nodules. 3. Subcentimeter  low-attenuation lesion in the central aspect of segment 8 of the liver is too small to characterize. This is statistically likely a tiny cyst, but warrants attention on follow-up studies to exclude the possibility of a solitary hepatic metastasis. 4. 1.5 x 1.5 x 1.7 cm well-circumscribed lesion in the proximal stomach. This is of uncertain etiology and significance, and could represent a benign gastric polyp, however, further evaluation with nonemergent endoscopy is suggested in the near future for further evaluation. 5. **An incidental finding of potential clinical significance has been found. 1.1 x 1.6 cm thyroid nodule in the inferior aspect of the right lobe of the thyroid gland. Follow-up evaluation with nonemergent thyroid ultrasound is recommended in the near future to better evaluate this finding. This recommendation follows ACR consensuss guidelines: Managing Incidental Thyroid Nodules Detected on Imaging: White Paper of the ACR Incidental Thyroid Findings Committee. J Am Coll Radiol 2015;12(2):143-150.** 6. Aortic atherosclerosis, in addition to left anterior descending coronary artery disease   10/22/2016 Surgery   Robotic assisted total hysterectomy, BSO and bilateral pelvic lymphadenectomy  Final pathology revealed a 3cm polyp containing serous carcinoma but with no myometrial invasion, no LVSI and negative nodes.  Stage IA Uterine serous cancer   10/22/2016 Pathology Results   1. Lymph nodes, regional resection, right pelvic - SIX BENIGN LYMPH NODES (0/6). 2. Lymph nodes, regional resection, left pelvic - SEVEN BENIGN LYMPH NODES (0/7). 3. Uterus +/- tubes/ovaries, neoplastic, with right ovary and fallopian tube ENDOMYOMETRIUM - SEROUS CARCINOMA ARISING WITHIN AN ENDOMETRIAL POLYP - NO MYOMETRIAL INVASION IDENTIFIED - ADENOMYOSIS - LEIOMYOMA (1 CM) - SEE ONCOLOGY TABLE AND COMMENT CERVIX - CARCINOMA FOCALLY INVOLVES ENDOCERVICAL GLANDS - NABOTHIAN CYSTS RIGHT ADNEXA - BENIGN  OVARY AND FALLOPIAN TUBE - NO CARCINOMA IDENTIFIED 4. Cul-de-sac biopsy - MESOTHELIAL HYPERPLASIA Microscopic Comment  3. ONCOLOGY TABLE-UTERUS, CARCINOMA OR CARCINOSARCOMA Specimen: Uterus, right fallopian tube and ovary Procedure: Total hysterectomy and right salpingo-oophorectomy Lymph node sampling performed: Bilateral pelvic regional resection Specimen integrity: Intact Maximum tumor size: 3 cm (polyp) Histologic type: Serous carcinoma Grade: High grade Myometrial invasion: Not identified Cervical stromal involvement: No, focal endocervical gland involvement Extent of involvement of other organs: Not identified Lymph - vascular invasion: Not identified Peritoneal washings: N/A Lymph nodes: Examined: 0 Sentinel 13 Non-sentinel 13 Total Lymph nodes with metastasis: 0 Isolated tumor cells (< 0.2 mm): 0 Micrometastasis: (> 0.2 mm and < 2.0 mm): 0 Macrometastasis: (> 2.0 mm): 0 Extracapsular extension: N/A Pelvic lymph nodes: 0 involved of 13 lymph nodes. Para-aortic lymph nodes: No para-aortic nodes submitted TNM code: pT1a, pNX FIGO Stage (based on pathologic findings, needs clinical correlation): IA Comment: Immunohistochemistry for cytokeratin AE1/AE3 is performed on all of the lymph nodes (parts 1 & 2) and no metastatic carcinoma is identified.   07/04/2017 Genetic Testing   The patient had genetic testing due to a personal history of breast and uterine cancer, and a family history of stomach cancer.  The Multi-Cancer Panel was ordered. The Multi-Cancer Panel offered by [facility] includes sequencing and/or deletion duplication testing of the following 83 genes: ALK, APC, ATM, AXIN2,BAP1,  BARD1, BLM, BMPR1A, BRCA1, BRCA2, BRIP1, CASR, CDC73, CDH1, CDK4, CDKN1B, CDKN1C, CDKN2A (p14ARF), CDKN2A (p16INK4a), CEBPA, CHEK2, CTNNA1, DICER1, DIS3L2, EGFR (c.2369C>T, p.Thr790Met variant only), EPCAM (Deletion/duplication testing only), FH, FLCN, GATA2, GPC3, GREM1 (Promoter region  deletion/duplication testing only), HOXB13 (c.251G>A, p.Gly84Glu), HRAS, KIT, MAX, MEN1, MET, MITF (c.952G>A, p.Glu318Lys variant only), MLH1, MSH2, MSH3, MSH6, MUTYH, NBN, NF1, NF2, NTHL1, PALB2, PDGFRA, PHOX2B, PMS2, POLD1, POLE, POT1, PRKAR1A, PTCH1, PTEN, RAD50, RAD51C, RAD51D, RB1, RECQL4, RET, RUNX1, SDHAF2, SDHA (sequence changes only), SDHB, SDHC, SDHD, SMAD4, SMARCA4, SMARCB1, SMARCE1, STK11, SUFU, TERC, TERT, TMEM127, TP53, TSC1, TSC2, VHL, WRN and WT1.   Results: No pathogenic variants were identified.  A variant of uncertain significance in the gene APC was identified.  c.791A>G (p.Gln264Arg).  The date of this test report is 07/04/2017.     Genetic Testing   Patient has genetic testing done for MSI. 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    12/03/2017 Imaging   CT Chest/Abd/Pelvis to follow pulmonary nodule and gastric mass IMPRESSION: 1. Stable CT of the chest. Small pulmonary nodules are unchanged when compared with previous exam. 2. No new findings identified. 3. Subcentimeter low-attenuation lesions within the liver are remain too small to characterize but are stable from prior exam. 4. Persistent indeterminate low-attenuation structure within the proximal stomach is unchanged measuring 1.4 cm. Correlation with direct visualization is advised   06/30/2018 Imaging   CT CHEST Lungs/Pleura: Stable scattered sub-cm pulmonary nodules are again seen bilaterally and are stable compared to previous studies. No new or enlarging pulmonary nodules or masses identified. No evidence of pulmonary infiltrate or pleural effusion.   08/19/2018 Echocardiogram   ECHO is done in [place]: EF 60-65%. Mild impaired relaxation. (report scanned)   09/17/2018 Relapse/Recurrence   Presented with c/o vagina discharge.  Lesion noted at the left vaginal apex 67m lesion removed Path c/w high grade serous cancer   09/17/2018 Pathology Results   Vagina, biopsy, left apex -  HIGH GRADE SEROUS CARCINOMA.   09/28/2018 PET scan   1. Two hypermetabolic axial lymph nodes. Unusual site for metastatic endometrial carcinoma however the activity is more intense than typically seen in reactive adenopathy. Suggest ultrasound-guided percutaneous biopsy of  the larger RIGHT axial lymph node 2. No evidence of local recurrence at the vaginal cuff.  3. No metastatic adenopathy in the abdomen or pelvis. 4. Stable small pulmonary nodules   10/09/2018 Pathology Results   Lymph node, needle/core biopsy, right axilla - METASTATIC CARCINOMA, SEE COMMENT. Microscopic Comment The carcinoma appears high grade. Immunohistochemistry is positive for cytokeratin 7, PAX-8, and ER. Cytokeratin 20, CDX-2, PR, and GATA-3 are negative. The findings along with the history are consistent with a gynecologic primary.    10/09/2018 Procedure   Ultrasound-guided core biopsies of a right axillary lymph node.   10/14/2018 Cancer Staging   Staging form: Corpus Uteri - Carcinoma and Carcinosarcoma, AJCC 8th Edition - Clinical: Stage IVB (cT1, cN0, pM1) - Signed by [name], MD on 10/14/2018   10/19/2018 Imaging   Placement of single lumen port a cath via right internal jugular vein. The catheter tip lies at the cavo-atrial junction. A power injectable port a cath was placed and is ready for immediate use.    10/21/2018 -  Chemotherapy   The patient had carboplatin and taxol for treatment   11/11/2018 -  Chemotherapy   Patient is on Treatment Plan : BREAST Trastuzumab q21d     12/21/2018 PET scan   1. Interval resolution of hypermetabolic right axillary lymph nodes. No metabolic findings highly suspicious for recurrent metastatic disease. 2. New mild hypermetabolism within a borderline prominent portacaval lymph node, nonspecific. While a reactive node is favored, a lymph node metastasis cannot be entirely excluded. Suggest attention to this lymph node on follow-up PET-CT in 3-6 months. 3. Nonspecific new  hypermetabolism at the ileocecal valve, more likely physiologic given absence of CT correlate. 4. Scattered subcentimeter pulmonary nodules are all stable and below PET resolution, more likely benign, continued CT surveillance advised. 5. Chronic findings include: Aortic Atherosclerosis (ICD10-I70.0). Marked diffuse colonic diverticulosis. Coronary atherosclerosis.     12/28/2018 Echocardiogram   1. The left ventricle has normal systolic function with an ejection fraction of 60-65%. The cavity size was normal. Left ventricular diastolic Doppler parameters are consistent with impaired relaxation.  2. GLS recorded as -10.7 but LV appears hyperdynamic and tracking of endocardium appears poor.  3. The right ventricle has normal systolic function. The cavity was normal. There is no increase in right ventricular wall thickness.  4. Mild thickening of the mitral valve leaflet.  5. The aortic valve was not well visualized. Mild thickening of the aortic valve. Aortic valve regurgitation is trivial by color flow Doppler.   03/23/2019 Imaging   PET 1. No findings of hypermetabolic residual/recurrent or metastatic disease. 2. Similar low-level hypermetabolism within normal sized portocaval and right inguinal nodes, favored to be reactive. 3. Ongoing stability of small bilateral pulmonary nodules, favored to be benign. Below PET resolution. 4. Coronary artery atherosclerosis. Aortic Atherosclerosis   03/26/2019 Echocardiogram   1. The left ventricle has normal systolic function, with an ejection fraction of 55-60%. The cavity size was normal. Left ventricular diastolic Doppler parameters are consistent with impaired relaxation.  2. The right ventricle has normal systolic function. The cavity was normal.  3. The mitral valve is abnormal. Mild thickening of the mitral valve leaflet. There is mild mitral annular calcification present.  4. The tricuspid valve is grossly normal.  5. The aortic valve is  tricuspid. Mild calcification of the aortic valve. No stenosis of the aortic valve.  6. The aorta is normal unless otherwise noted.  7. Normal LV systolic function; grade 1 diastolic dysfunction; CBS-49.6%.  06/30/2019 Imaging   Chest Impression:   1. No evidence thoracic metastasis. 2. Stable small benign-appearing pulmonary nodules   Abdomen / Pelvis Impression:   1. No evidence of metastatic disease in the abdomen pelvis. 2.  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 3.  Aortic Atherosclerosis (ICD10-I70.0).   06/30/2019 Echocardiogram    1. Left ventricular ejection fraction, by visual estimation, is 60 to 65%. The left ventricle has normal function. There is no left ventricular hypertrophy.  2. Left ventricular diastolic parameters are consistent with Grade I diastolic dysfunction (impaired relaxation).  3. The left ventricle has no regional wall motion abnormalities.  4. Global right ventricle has normal systolic function.The right ventricular size is normal. No increase in right ventricular wall thickness.  5. Left atrial size was mild-moderately dilated.  6. Right atrial size was normal.  7. The mitral valve is normal in structure. Trivial mitral valve regurgitation.  8. The tricuspid valve is normal in structure. Tricuspid valve regurgitation is trivial.  9. The aortic valve is normal in structure. Aortic valve regurgitation is trivial. No evidence of aortic valve sclerosis or stenosis. 10. The pulmonic valve was grossly normal. Pulmonic valve regurgitation is not visualized. 11. Mildly elevated pulmonary artery systolic pressure. 12. The inferior vena cava is normal in size with greater than 50% respiratory variability, suggesting right atrial pressure of 3 mmHg. 13. The average left ventricular global longitudinal strain is -12.5 %. GLS underestimated due to poor endocardial tracking.     09/27/2019 Imaging   1. Multiple small, nonspecific pulmonary nodules  are again noted. The previously noted lung nodules are unchanged in size from previous exam. There is a new lung nodule within the medial right lower lobe which is nonspecific measure 4 mm. Attention at follow-up imaging is recommended. 2. No evidence of metastatic disease within the abdomen or pelvis. 3. Coronary artery calcifications.  The 4.  Aortic Atherosclerosis (ICD10-I70.0).   09/30/2019 Echocardiogram    1. Left ventricular ejection fraction, by estimation, is 60 to 65%. The left ventricle has normal function. The left ventricle has no regional wall motion abnormalities. Left ventricular diastolic parameters are consistent with Grade I diastolic dysfunction (impaired relaxation).  2. Right ventricular systolic function is normal. The right ventricular size is normal. There is normal pulmonary artery systolic pressure. The estimated right ventricular systolic pressure is 21.1 mmHg.  3. The mitral valve is normal in structure. No evidence of mitral valve regurgitation. No evidence of mitral stenosis.  4. The aortic valve is normal in structure. Aortic valve regurgitation is trivial. No aortic stenosis is present.  5. The inferior vena cava is normal in size with greater than 50% respiratory variability, suggesting right atrial pressure of 3 mmHg.     01/07/2020 Echocardiogram    1. Normal LV function; grade 1 diastolic dysfunction; mild AI; GLS -21%.  2. Left ventricular ejection fraction, by estimation, is 55 to 60%. The left ventricle has normal function. The left ventricle has no regional wall motion abnormalities. Left ventricular diastolic parameters are consistent with Grade I diastolic dysfunction (impaired relaxation).  3. Right ventricular systolic function is normal. The right ventricular size is normal.  4. The mitral valve is normal in structure. Trivial mitral valve regurgitation. No evidence of mitral stenosis.  5. The aortic valve is tricuspid. Aortic valve regurgitation is  mild. Mild aortic valve sclerosis is present, with no evidence of aortic valve stenosis.  6. The inferior vena cava is normal in size with greater than 50%  respiratory variability, suggesting right atrial pressure of 3 mmHg.     03/27/2020 Imaging   1. Status post hysterectomy. No evidence of recurrent or metastatic disease in the abdomen or pelvis. 2. Multiple small pulmonary nodules in the lung bases are unchanged to the extent that they are included on current examination and remain most likely sequelae of prior infection or inflammation. Attention on follow-up. 3. Aortic Atherosclerosis (ICD10-I70.0).   04/17/2020 Echocardiogram   1. Normal LVEF, normal and unchanged GLS: -20.2%.  2. Left ventricular ejection fraction, by estimation, is 60 to 65%. The left ventricle has normal function. The left ventricle has no regional wall motion abnormalities. There is mild concentric left ventricular hypertrophy. Left ventricular diastolic parameters are consistent with Grade I diastolic dysfunction (impaired relaxation). The average left ventricular global longitudinal strain is -20.2 %. The global longitudinal strain is normal.  3. Right ventricular systolic function is normal. The right ventricular size is normal.  4. The mitral valve is normal in structure. Mild mitral valve regurgitation. No evidence of mitral stenosis.  5. The aortic valve is normal in structure. There is mild calcification of the aortic valve. There is mild thickening of the aortic valve. Aortic valve regurgitation is mild. No aortic stenosis is present.  6. The inferior vena cava is normal in size with greater than 50% respiratory variability, suggesting right atrial pressure of 3 mmHg.   08/02/2020 Echocardiogram   1. Left ventricular ejection fraction, by estimation, is 55 to 60%. The left ventricle has normal function. The left ventricle has no regional wall motion abnormalities. Left ventricular diastolic parameters are consistent  with Grade I diastolic dysfunction (impaired relaxation). The average left ventricular global longitudinal strain is -27.0 %. The global longitudinal strain is normal.  2. Right ventricular systolic function is normal. The right ventricular size is normal. Tricuspid regurgitation signal is inadequate for assessing PA pressure.  3. The mitral valve is grossly normal. Trivial mitral valve regurgitation. No evidence of mitral stenosis.  4. The aortic valve is tricuspid. Aortic valve regurgitation is mild. No aortic stenosis is present.  5. The inferior vena cava is normal in size with greater than 50% respiratory variability, suggesting right atrial pressure of 3 mmHg.   09/19/2020 Imaging   1. Status post hysterectomy. No evidence of recurrent or new metastatic disease within the chest, abdomen, or pelvis. 2. Scattered bilateral tiny pulmonary nodules, unchanged from prior studies and most likely sequela of prior infection or inflammation, recommend attention on follow-up imaging. No new suspicious pulmonary nodules or masses. 3. Subcutaneous edema and small hematoma overlying the posterior gluteal musculature, recommend correlation with recent history of trauma. 4. Mild low-density wall thickening of the gastric antrum, which may represent gastritis. 5. Hepatic steatosis. 6. Extensive sigmoid colonic diverticulosis without findings of acute diverticulitis. 7. Aortic atherosclerosis.  Aortic Atherosclerosis (ICD10-I70.0).   11/20/2020 Echocardiogram    1. Compared to echo from Jan 2022, Global longitudinal strain is less negative. (Previously -27%). Left ventricular ejection fraction, by estimation, is 60 to 65%. The left ventricle has normal function. The left ventricle has no regional wall motion abnormalities. There is mild left ventricular hypertrophy. Left ventricular diastolic parameters are indeterminate.  2. Right ventricular systolic function is normal. The right ventricular size is normal.  3.  The mitral valve is normal in structure. Trivial mitral valve regurgitation.  4. Aortic valve regurgitation is mild. Mild aortic valve sclerosis is present, with no evidence of aortic valve stenosis.     03/12/2021 Echocardiogram  1. Left ventricular ejection fraction, by estimation, is 60 to 65%. Left ventricular ejection fraction by 2D MOD biplane is 63.6 %. The left ventricle has normal function. The left ventricle has no regional wall motion abnormalities. There is mild left ventricular hypertrophy. Left ventricular diastolic parameters are consistent with Grade I diastolic dysfunction (impaired relaxation). The average left ventricular global longitudinal strain is -20.0 %. The global longitudinal strain is normal.  2. Right ventricular systolic function is normal. The right ventricular size is normal. There is normal pulmonary artery systolic pressure. The estimated right ventricular systolic pressure is 75.6 mmHg.  3. The mitral valve is normal in structure. No evidence of mitral valve regurgitation. No evidence of mitral stenosis.  4. The aortic valve is tricuspid. Aortic valve regurgitation is trivial. No aortic stenosis is present. Aortic regurgitation PHT measures 403 msec.  5. The inferior vena cava is normal in size with greater than 50% respiratory variability, suggesting right atrial pressure of 3 mmHg.   04/02/2021 Imaging   Stable small pulmonary nodules scattered throughout the chest, unchanged, without new or suspicious pulmonary nodule.   Three-vessel coronary artery disease.   Aortic Atherosclerosis (ICD10-I70.0).   06/06/2021 Imaging    1. Left ventricular ejection fraction, by estimation, is 60 to 65%. The left ventricle has normal function. The left ventricle has no regional wall motion abnormalities. Left ventricular diastolic parameters are consistent with Grade I diastolic dysfunction (impaired relaxation).  2. Right ventricular systolic function is normal. The right  ventricular size is normal. There is normal pulmonary artery systolic pressure. The estimated right ventricular systolic pressure is 43.3 mmHg.  3. Left atrial size was mildly dilated.  4. The mitral valve is normal in structure. Trivial mitral valve regurgitation.  5. The aortic valve is tricuspid. Aortic valve regurgitation is mild. No aortic stenosis is present.  6. The inferior vena cava is normal in size with greater than 50% respiratory variability, suggesting right atrial pressure of 3 mmHg.  7. GLS attempted but not reported due to suboptimal tracking.     09/25/2021 Echocardiogram    1. Left ventricular ejection fraction, by estimation, is 60 to 65%. Left ventricular ejection fraction by 3D volume is 56 %. The left ventricle has normal function. The left ventricle has no regional wall motion abnormalities. Left ventricular diastolic parameters were normal.  2. Right ventricular systolic function is normal. The right ventricular size is normal. There is normal pulmonary artery systolic pressure.  3. The mitral valve is normal in structure. Trivial mitral valve regurgitation. No evidence of mitral stenosis.  4. The aortic valve is tricuspid. Aortic valve regurgitation is mild. No aortic stenosis is present.  5. The inferior vena cava is normal in size with greater than 50% respiratory variability, suggesting right atrial pressure of 3 mmHg.   10/15/2021 Imaging   1. Stable examination post prior hysterectomy without evidence of local recurrence or metastatic disease within the chest, abdomen, or pelvis. 2. Unchanged small bilateral pulmonary nodules, likely benign. 3. Left-sided colonic diverticulosis without findings of acute diverticulitis. 4. Aortic Atherosclerosis (ICD10-I70.0).       Metastasis to lymph nodes ([place])  10/14/2018 Initial Diagnosis   Metastasis to lymph nodes ([place])   10/21/2018 - 02/24/2019 Chemotherapy   The patient had palonosetron (ALOXI) injection 0.25 mg, 0.25 mg,  Intravenous,  Once, 7 of 7 cycles Administration: 0.25 mg (10/21/2018), 0.25 mg (11/11/2018), 0.25 mg (12/02/2018), 0.25 mg (12/23/2018), 0.25 mg (01/13/2019), 0.25 mg (02/03/2019), 0.25 mg (02/24/2019) CARBOplatin (PARAPLATIN) 310 mg  in sodium chloride 0.9 % 250 mL chemo infusion, 310 mg, Intravenous,  Once, 7 of 7 cycles Dose modification: 300 mg (original dose 311.5 mg, Cycle 4, Reason: Dose not tolerated) Administration: 310 mg (10/21/2018), 300 mg (11/11/2018), 300 mg (12/02/2018), 300 mg (12/23/2018), 300 mg (01/13/2019), 300 mg (02/03/2019), 300 mg (02/24/2019) PACLitaxel (TAXOL) 222 mg in sodium chloride 0.9 % 250 mL chemo infusion (> 33m/m2), 135 mg/m2 = 222 mg, Intravenous,  Once, 7 of 7 cycles Administration: 222 mg (10/21/2018), 222 mg (11/11/2018), 222 mg (12/02/2018), 222 mg (12/23/2018), 222 mg (01/13/2019), 222 mg (02/03/2019), 222 mg (02/24/2019) fosaprepitant (EMEND) 150 mg, dexamethasone (DECADRON) 12 mg in sodium chloride 0.9 % 145 mL IVPB, , Intravenous,  Once, 7 of 7 cycles Administration:  (10/21/2018),  (11/11/2018),  (12/02/2018),  (12/23/2018),  (01/13/2019),  (02/03/2019),  (02/24/2019)  for chemotherapy treatment.      Interval History: Continues on [name] for maintenance therapy in the setting of HER2+ recurrent uterine cancer.  Denies any symptoms related to [name].  Reports overall doing well.  Denies any vaginal bleeding or discharge.  Denies any pelvic or abdominal pain.  Reports baseline bowel and bladder function.  [place]  Past Medical/Surgical History: Past Medical History:  Diagnosis Date   Aortic atherosclerosis ([place]    Arthritis    Blood transfusion without reported diagnosis    Breast cancer ([place]     LEFT, '82-left breast cancer-surgery only   Cataract    removed both eyes   Complication of anesthesia    nausea   Diverticulosis    Esophageal stricture    Family history of stomach cancer    GERD  (gastroesophageal reflux disease)    Hemorrhoids    Hiatal hernia    High triglycerides    Hypercholesteremia    Hypertension    Hypothyroidism    Osteoarthritis    PONV (postoperative nausea and vomiting)    Uterine cancer ([place]     Past Surgical History:  Procedure Laterality Date   COLONOSCOPY     EUS N/A 01/07/2013   Procedure: UPPER ENDOSCOPIC ULTRASOUND (EUS) LINEAR;  Surgeon: DMilus Banister MD;  [facility];  Service: Endoscopy;  Laterality: N/A;   EUS N/A 01/05/2015   Procedure: UPPER ENDOSCOPIC ULTRASOUND (EUS) LINEAR;  Surgeon: DMilus Banister MD;  [facility];  Service: Endoscopy;  Laterality: N/A;   FOOT SURGERY Left 2009-2010   x2. Surgery in 2011   IR IMAGING GUIDED PORT INSERTION  10/19/2018   LEFT HEART CATHETERIZATION WITH CORONARY ANGIOGRAM N/A 01/24/2014   Procedure: LEFT HEART CATHETERIZATION WITH CORONARY ANGIOGRAM;  Surgeon: Peter M JMartinique MD;  [facility];  Service: Cardiovascular;  Laterality: N/A;   LUMBAR DISC SURGERY  01-01-13   MASTECTOMY, RADICAL Left 1982   OVARY SURGERY  1972-1973   ROBOTIC ASSISTED TOTAL HYSTERECTOMY WITH BILATERAL SALPINGO OOPHERECTOMY N/A 10/22/2016   Procedure: XI ROBOTIC ASSISTED TOTAL HYSTERECTOMY WITH RIGHT SALPINGO OOPHORECTOMY; INJECTION OF GREEN DYE WITH EXCISION OF BILATERAL PELVIC LYMPH NODES;  Surgeon: WJanie Morning MD;  [facility];  Service: Gynecology;  Laterality: N/A;   ROTATOR CUFF REPAIR Left    SENTINEL NODE BIOPSY N/A 10/22/2016   Procedure: SENTINEL NODE BIOPSY;  Surgeon: WJanie Morning MD;  [facility];  Service: Gynecology;  Laterality: N/A;   SIMPLE MASTECTOMY Left 1982   TOTAL KNEE ARTHROPLASTY Bilateral 2009-2010   x2   UPPER GASTROINTESTINAL ENDOSCOPY  Family History  Problem Relation Age of Onset   Heart disease Mother    Other Mother        blood disease   Heart attack Father    Colon polyps Sister    Heart attack Brother    Colon polyps Brother     Stomach cancer Maternal Aunt    Colon cancer Neg Hx    Esophageal cancer Neg Hx    Rectal cancer Neg Hx    Pancreatic cancer Neg Hx     Social History   Socioeconomic History   Marital status: Widowed    Spouse name: Not on file   Number of children: 3   Years of education: Not on file   Highest education level: Not on file  Occupational History   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   Drug use: No   Sexual activity: Never  Other Topics Concern   Not on file  Social History Narrative   Not on file   Social Determinants of Health   Financial Resource Strain: Not on file  Food Insecurity: Not on file  Transportation Needs: Not on file  Physical Activity: Not on file  Stress: Not on file  Social Connections: Not on file    Current Medications:  Current Outpatient Medications:    acetaminophen (TYLENOL) 500 MG tablet, Take 1,000 mg by mouth every 6 (six) hours as needed., Disp: , Rfl:    aspirin EC 81 MG tablet, Take 81 mg by mouth daily., Disp: , Rfl:    atorvastatin (LIPITOR) 20 MG tablet, Take 20 mg by mouth every evening. , Disp: , Rfl:    Calcium Carbonate-Vitamin D (CALCIUM-VITAMIN D) 500-200 MG-UNIT per tablet, Take 1 tablet by mouth daily., Disp: , Rfl:    citalopram (CELEXA) 20 MG tablet, Take 20 mg by mouth at bedtime. , Disp: , Rfl:    gabapentin (NEURONTIN) 600 MG tablet, Take 600 mg by mouth at bedtime as needed., Disp: , Rfl:    levothyroxine (SYNTHROID, LEVOTHROID) 100 MCG tablet, Take 100 mcg by mouth daily before breakfast. , Disp: , Rfl:    losartan-hydrochlorothiazide (HYZAAR) 100-25 MG tablet, , Disp: , Rfl:    magnesium oxide (MAG-OX) 400 (241.3 Mg) MG tablet,  Take 1 tablet (400 mg total) by mouth daily., Disp: 30 tablet, Rfl: 11   metoprolol succinate (TOPROL-XL) 25 MG 24 hr tablet, Take 25 mg by mouth daily., Disp: , Rfl:    Multiple Vitamin (MULTIVITAMIN WITH MINERALS) TABS, Take 1 tablet by mouth daily., Disp: , Rfl:    valACYclovir (VALTREX) 1000 MG tablet, valacyclovir 1 gram tablet, Disp: , Rfl:    estradiol (ESTRACE) 0.1 MG/GM vaginal cream, PLACE 1 APPLICATORFUL VAGINALLY 3 TIMES A WEEK (Patient not taking: Reported on 12/27/2021), Disp: 42.5 g, Rfl: 12 No current facility-administered medications for this visit.  Facility-Administered Medications Ordered in Other Visits:    sodium chloride flush (NS) 0.9 % injection 10 mL, 10 mL, Intracatheter, PRN, Alvy Bimler, Ni, MD, 10 mL at 09/04/21 1522  Review of Systems: Denies appetite changes, fevers, chills, fatigue, unexplained weight changes. Denies hearing loss, neck lumps or masses, mouth sores,  ringing in ears or voice changes. Denies cough or wheezing.  Denies shortness of breath. Denies chest pain or palpitations. Denies leg swelling. Denies abdominal distention, pain, blood in stools, constipation, diarrhea, nausea, vomiting, or early satiety. Denies pain with intercourse, dysuria, frequency, hematuria or incontinence. Denies hot flashes, pelvic pain, vaginal bleeding or vaginal discharge.   Denies joint pain, back pain or muscle pain/cramps. Denies itching, rash, or wounds. Denies dizziness, headaches, numbness or seizures. Denies swollen lymph nodes or glands, denies easy bruising or bleeding. Denies anxiety, depression, confusion, or decreased concentration.  Physical Exam: BP (!) 149/62 (BP Location: Right Arm, Patient Position: Sitting)   Pulse 79   Temp 98.3 F (36.8 C) (Oral)   Resp 16   Ht 5' 2" (1.575 m)   Wt 134 lb (60.8 kg)   SpO2 96%   BMI 24.51 kg/m  General: Alert, oriented, no acute distress. HEENT: Normocephalic, atraumatic, sclera anicteric. Chest: Clear to  auscultation bilaterally.  No wheezes or rhonchi. Cardiovascular: Regular rate and rhythm, no murmurs. Abdomen: soft, nontender.  Normoactive bowel sounds.  No masses or hepatosplenomegaly appreciated.  Well-healed scar. Extremities: Grossly normal range of motion.  Warm, well perfused.  No edema bilaterally. Skin: No rashes or lesions noted. Lymphatics: No cervical, supraclavicular, or inguinal adenopathy. GU: Normal appearing external genitalia without erythema, excoriation, or lesions.  Speculum exam reveals mildly atrophic vaginal mucosa, cuff intact, no lesions, bleeding, or discharge.  Bimanual exam reveals cuff intact and smooth, no nodularity or masses.  Rectovaginal exam confirms findings.  Laboratory & Radiologic Studies: CT 10/15/21: IMPRESSION: 1. Stable examination post prior hysterectomy without evidence of local recurrence or metastatic disease within the chest, abdomen, or pelvis. 2. Unchanged small bilateral pulmonary nodules, likely benign. 3. Left-sided colonic diverticulosis without findings of acute diverticulitis. 4. Aortic Atherosclerosis (ICD10-I70.0).  Assessment & Plan: Vicki Cervantes is a 81 y.o. 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  PET imaging notable for hypermetabolic activity and right axillary lymph nodes. Of note the breast cancer at age 43 was left-sided and a reducing mastectomy was performed on the right.  Vaginal biopsy and ultrasound-guided biopsy of the right axillary node were positive for metastatic uterine serous cancer. S/p taxol/carbo, herceptin completed 02/2019, has been on maintenance herceptin since.   Patient is overall doing very well, tolerating [name] maintenance without symptoms, and remains NED both on exam and imaging.  She will continue with [name] infusions and visits with Dr. Alvy Bimler.  I will plan to see her every 6 months for appointment and pelvic exam.  We reviewed signs and symptoms that would be  concerning for cancer recurrence and I stressed the importance of calling if she develops any of these.  22 minutes of total time was spent for this patient encounter, including preparation, face-to-face counseling with the patient and coordination of care, and documentation of the encounter.  Jeral Pinch, MD  Division of Gynecologic Oncology  Department of Obstetrics and Gynecology  [facility]

## 2022-01-02 DIAGNOSIS — R2689 Other abnormalities of gait and mobility: Secondary | ICD-10-CM | POA: Diagnosis not present

## 2022-01-02 DIAGNOSIS — I700 Atherosclerosis of aorta: Secondary | ICD-10-CM | POA: Diagnosis not present

## 2022-01-02 DIAGNOSIS — C541 Malignant neoplasm of endometrium: Secondary | ICD-10-CM | POA: Diagnosis not present

## 2022-01-02 DIAGNOSIS — C7800 Secondary malignant neoplasm of unspecified lung: Secondary | ICD-10-CM | POA: Diagnosis not present

## 2022-01-02 DIAGNOSIS — C50919 Malignant neoplasm of unspecified site of unspecified female breast: Secondary | ICD-10-CM | POA: Diagnosis not present

## 2022-01-02 DIAGNOSIS — N1832 Chronic kidney disease, stage 3b: Secondary | ICD-10-CM | POA: Diagnosis not present

## 2022-01-02 DIAGNOSIS — D849 Immunodeficiency, unspecified: Secondary | ICD-10-CM | POA: Diagnosis not present

## 2022-01-08 ENCOUNTER — Other Ambulatory Visit: Payer: Self-pay

## 2022-01-08 ENCOUNTER — Inpatient Hospital Stay: Payer: Medicare HMO | Admitting: Hematology and Oncology

## 2022-01-08 ENCOUNTER — Inpatient Hospital Stay: Payer: Medicare HMO

## 2022-01-08 ENCOUNTER — Encounter: Payer: Self-pay | Admitting: Hematology and Oncology

## 2022-01-08 DIAGNOSIS — I10 Essential (primary) hypertension: Secondary | ICD-10-CM

## 2022-01-08 DIAGNOSIS — C541 Malignant neoplasm of endometrium: Secondary | ICD-10-CM

## 2022-01-08 DIAGNOSIS — N183 Chronic kidney disease, stage 3 unspecified: Secondary | ICD-10-CM | POA: Diagnosis not present

## 2022-01-08 DIAGNOSIS — Z79890 Hormone replacement therapy (postmenopausal): Secondary | ICD-10-CM | POA: Diagnosis not present

## 2022-01-08 DIAGNOSIS — Z90710 Acquired absence of both cervix and uterus: Secondary | ICD-10-CM | POA: Diagnosis not present

## 2022-01-08 DIAGNOSIS — Z90721 Acquired absence of ovaries, unilateral: Secondary | ICD-10-CM | POA: Diagnosis not present

## 2022-01-08 DIAGNOSIS — Z9221 Personal history of antineoplastic chemotherapy: Secondary | ICD-10-CM | POA: Diagnosis not present

## 2022-01-08 DIAGNOSIS — Z853 Personal history of malignant neoplasm of breast: Secondary | ICD-10-CM | POA: Diagnosis not present

## 2022-01-08 DIAGNOSIS — C779 Secondary and unspecified malignant neoplasm of lymph node, unspecified: Secondary | ICD-10-CM | POA: Diagnosis not present

## 2022-01-08 DIAGNOSIS — Z9012 Acquired absence of left breast and nipple: Secondary | ICD-10-CM | POA: Diagnosis not present

## 2022-01-08 DIAGNOSIS — Z79899 Other long term (current) drug therapy: Secondary | ICD-10-CM | POA: Diagnosis not present

## 2022-01-08 DIAGNOSIS — Z5112 Encounter for antineoplastic immunotherapy: Secondary | ICD-10-CM | POA: Diagnosis not present

## 2022-01-08 DIAGNOSIS — Z7982 Long term (current) use of aspirin: Secondary | ICD-10-CM | POA: Diagnosis not present

## 2022-01-08 LAB — CBC WITH DIFFERENTIAL/PLATELET
Abs Immature Granulocytes: 0.02 10*3/uL (ref 0.00–0.07)
Basophils Absolute: 0.1 10*3/uL (ref 0.0–0.1)
Basophils Relative: 2 %
Eosinophils Absolute: 0.4 10*3/uL (ref 0.0–0.5)
Eosinophils Relative: 6 %
HCT: 29.8 % — ABNORMAL LOW (ref 36.0–46.0)
Hemoglobin: 10.6 g/dL — ABNORMAL LOW (ref 12.0–15.0)
Immature Granulocytes: 0 %
Lymphocytes Relative: 25 %
Lymphs Abs: 1.5 10*3/uL (ref 0.7–4.0)
MCH: 31.1 pg (ref 26.0–34.0)
MCHC: 35.6 g/dL (ref 30.0–36.0)
MCV: 87.4 fL (ref 80.0–100.0)
Monocytes Absolute: 0.6 10*3/uL (ref 0.1–1.0)
Monocytes Relative: 10 %
Neutro Abs: 3.3 10*3/uL (ref 1.7–7.7)
Neutrophils Relative %: 57 %
Platelets: 227 10*3/uL (ref 150–400)
RBC: 3.41 MIL/uL — ABNORMAL LOW (ref 3.87–5.11)
RDW: 11.9 % (ref 11.5–15.5)
WBC: 5.8 10*3/uL (ref 4.0–10.5)
nRBC: 0 % (ref 0.0–0.2)

## 2022-01-08 LAB — COMPREHENSIVE METABOLIC PANEL
ALT: 14 U/L (ref 0–44)
AST: 20 U/L (ref 15–41)
Albumin: 4.1 g/dL (ref 3.5–5.0)
Alkaline Phosphatase: 61 U/L (ref 38–126)
Anion gap: 8 (ref 5–15)
BUN: 28 mg/dL — ABNORMAL HIGH (ref 8–23)
CO2: 29 mmol/L (ref 22–32)
Calcium: 9.9 mg/dL (ref 8.9–10.3)
Chloride: 98 mmol/L (ref 98–111)
Creatinine, Ser: 1.27 mg/dL — ABNORMAL HIGH (ref 0.44–1.00)
GFR, Estimated: 42 mL/min — ABNORMAL LOW (ref >60)
Glucose, Bld: 93 mg/dL (ref 70–99)
Potassium: 3.7 mmol/L (ref 3.5–5.1)
Sodium: 135 mmol/L (ref 135–145)
Total Bilirubin: 0.4 mg/dL (ref 0.3–1.2)
Total Protein: 6.7 g/dL (ref 6.5–8.1)

## 2022-01-08 MED ORDER — SODIUM CHLORIDE 0.9 % IV SOLN: Freq: Once | INTRAVENOUS | Status: AC

## 2022-01-08 MED ORDER — ACETAMINOPHEN 325 MG PO TABS
650.0000 mg | ORAL_TABLET | Freq: Once | ORAL | Status: AC
  Administered 2022-01-08: 650 mg via ORAL
  Filled 2022-01-08: qty 2

## 2022-01-08 MED ORDER — HEPARIN SOD (PORK) LOCK FLUSH 100 UNIT/ML IV SOLN
500.0000 [IU] | Freq: Once | INTRAVENOUS | Status: AC | PRN
  Administered 2022-01-08: 500 [IU]

## 2022-01-08 MED ORDER — TRASTUZUMAB-ANNS CHEMO 150 MG IV SOLR
6.0000 mg/kg | Freq: Once | INTRAVENOUS | Status: AC
  Administered 2022-01-08: 357 mg via INTRAVENOUS
  Filled 2022-01-08: qty 17

## 2022-01-08 MED ORDER — DIPHENHYDRAMINE HCL 25 MG PO CAPS
25.0000 mg | ORAL_CAPSULE | Freq: Once | ORAL | Status: AC
  Administered 2022-01-08: 25 mg via ORAL
  Filled 2022-01-08: qty 1

## 2022-01-08 MED ORDER — SODIUM CHLORIDE 0.9% FLUSH
10.0000 mL | Freq: Once | INTRAVENOUS | Status: AC
  Administered 2022-01-08: 10 mL

## 2022-01-08 MED ORDER — SODIUM CHLORIDE 0.9% FLUSH
10.0000 mL | INTRAVENOUS | Status: DC | PRN
  Administered 2022-01-08: 10 mL

## 2022-01-23 ENCOUNTER — Other Ambulatory Visit: Payer: Self-pay

## 2022-01-23 ENCOUNTER — Encounter: Payer: Self-pay | Admitting: Physical Therapy

## 2022-01-23 ENCOUNTER — Ambulatory Visit: Payer: Medicare HMO | Attending: Internal Medicine | Admitting: Physical Therapy

## 2022-01-23 DIAGNOSIS — R2689 Other abnormalities of gait and mobility: Secondary | ICD-10-CM | POA: Diagnosis not present

## 2022-01-23 DIAGNOSIS — M6281 Muscle weakness (generalized): Secondary | ICD-10-CM | POA: Insufficient documentation

## 2022-01-23 NOTE — Patient Instructions (Signed)
Access Code: [email or web address] URL: [email or web address] Date: 01/23/2022 Prepared by: Hilda Blades  Exercises - Sidelying Hip Abduction  - 1 x daily - 2 sets - 10 reps - Sit to Stand Without Arm Support  - 1 x daily - 3 sets - 10 reps - Heel Toe Raises with Counter Support  - 1 x daily - 2 sets - 20 reps - Romberg Stance with Head Rotation  - 1-2 x daily - 2 sets - 10 reps - Romberg Stance with Head Nods  - 1-2 x daily - 2 sets - 10 reps - Standing Tandem Balance with Counter Support  - 1-2 x daily - 2 sets - 30 seconds hold

## 2022-01-23 NOTE — Therapy (Signed)
OUTPATIENT PHYSICAL THERAPY EVALUATION   Patient Name: Vicki Cervantes MRN: [id number] DOB:[birth year], 81 y.o., female Today's Date: 01/23/2022   PT End of Session - 01/23/22 1529     Visit Number 1    Number of Visits 9    Date for PT Re-Evaluation 03/20/22    Authorization Type [employer] MCR    Authorization Time Period FOTO by 6th    PT Start Time 1530    PT Stop Time 1615    PT Time Calculation (min) 45 min    Activity Tolerance Patient tolerated treatment well    Behavior During Therapy WFL for tasks assessed/performed             Past Medical History:  Diagnosis Date   Aortic atherosclerosis ([place])    Arthritis    Blood transfusion without reported diagnosis    Breast cancer ([place])     LEFT, '82-left breast cancer-surgery only   Cataract    removed both eyes   Complication of anesthesia    nausea   Diverticulosis    Esophageal stricture    Family history of stomach cancer    GERD (gastroesophageal reflux disease)    Hemorrhoids    Hiatal hernia    High triglycerides    Hypercholesteremia    Hypertension    Hypothyroidism    Osteoarthritis    PONV (postoperative nausea and vomiting)    Uterine cancer ([place])    Past Surgical History:  Procedure Laterality Date   COLONOSCOPY     EUS N/A 01/07/2013   Procedure: UPPER ENDOSCOPIC ULTRASOUND (EUS) LINEAR;  Surgeon: Milus Banister, MD;  [facility];  Service: Endoscopy;  Laterality: N/A;   EUS N/A 01/05/2015   Procedure: UPPER ENDOSCOPIC ULTRASOUND (EUS) LINEAR;  Surgeon: Milus Banister, MD;  [facility];  Service: Endoscopy;  Laterality: N/A;   FOOT SURGERY Left 2009-2010   x2. Surgery in 2011   IR IMAGING GUIDED PORT INSERTION  10/19/2018   LEFT HEART CATHETERIZATION WITH CORONARY ANGIOGRAM N/A 01/24/2014   Procedure: LEFT HEART CATHETERIZATION WITH CORONARY ANGIOGRAM;  Surgeon: Peter M Martinique, MD;  Location: [facility] CATH LAB;  Service: Cardiovascular;  Laterality: N/A;   LUMBAR DISC SURGERY   01-01-13   MASTECTOMY, RADICAL Left 1982   OVARY SURGERY  1972-1973   ROBOTIC ASSISTED TOTAL HYSTERECTOMY WITH BILATERAL SALPINGO OOPHERECTOMY N/A 10/22/2016   Procedure: XI ROBOTIC ASSISTED TOTAL HYSTERECTOMY WITH RIGHT SALPINGO OOPHORECTOMY; INJECTION OF GREEN DYE WITH EXCISION OF BILATERAL PELVIC LYMPH NODES;  Surgeon: Janie Morning, MD;  [facility];  Service: Gynecology;  Laterality: N/A;   ROTATOR CUFF REPAIR Left    SENTINEL NODE BIOPSY N/A 10/22/2016   Procedure: SENTINEL NODE BIOPSY;  Surgeon: Janie Morning, MD;  [facility];  Service: Gynecology;  Laterality: N/A;   SIMPLE MASTECTOMY Left 1982   TOTAL KNEE ARTHROPLASTY Bilateral 2009-2010   x2   UPPER GASTROINTESTINAL ENDOSCOPY     Patient Active Problem List   Diagnosis Date Noted   Osteopenia 10/31/2020   Hepatic steatosis 09/19/2020   Preventive measure 03/07/2020   Vaginitis 01/20/2020   Hypokalemia due to excessive renal loss of potassium 06/17/2019   Deficiency anemia 03/24/2019   Hypokalemia due to inadequate potassium intake 03/24/2019   Hypomagnesemia 03/24/2019   Physical debility 03/24/2019   Pancytopenia, acquired ([place]) 02/03/2019   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   Bone pain 11/10/2018   Encounter for antineoplastic chemotherapy 10/20/2018   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  Goals of care, counseling/discussion 10/14/2018   CKD (chronic kidney disease), stage III ([place]) 10/14/2018   Genetic testing 07/14/2017   History of breast cancer 06/25/2017   Family history of stomach cancer    Vaginal mass 02/13/2017   Thyroid nodule 02/13/2017   Pulmonary nodules/lesions, multiple 02/13/2017   Endometrial cancer ([place]) 09/30/2016   Essential hypertension 01/17/2014   Chest pain at rest 01/17/2014   Hyperlipidemia 04/03/2011   OTHER SPECIFIED DISORDER OF STOMACH AND DUODENUM 10/19/2009    PCP: Burnard Bunting, MD  REFERRING PROVIDER: Burnard Bunting, MD  REFERRING DIAG: Other  abnormalities of gait and mobility  THERAPY DIAG:  Muscle weakness (generalized)  Other abnormalities of gait and mobility  Rationale for Evaluation and Treatment Rehabilitation  ONSET DATE: Chronic   SUBJECTIVE:  SUBJECTIVE STATEMENT: Patient reports she is having balance issues. She has just started back exercising and she finds when she is walking that she is not so steady. She will sometime walk for exercise. She does not go up/down stairs a lot but when she does she has to use a railing, and she is unable to go down stair without a railing such as in a stadium.  PERTINENT HISTORY: History of breast cancer, uterine cancer  PAIN:  Are you having pain? No  PRECAUTIONS: None  WEIGHT BEARING RESTRICTIONS No  FALLS:  Has patient fallen in last 6 months? No  LIVING ENVIRONMENT: Lives with: lives alone Lives in: House/apartment Stairs: Yes: Internal: 1 flight steps; on left going up and External: 2 steps; none  OCCUPATION: Retired  PLOF: Independent  PATIENT GOALS: Improve balance   OBJECTIVE:  PATIENT SURVEYS:  FOTO 55% functional status  COGNITION: Overall cognitive status: Within functional limits for tasks assessed     SENSATION: WFL  MUSCLE LENGTH: Not assessed  POSTURE:   Slightly rounded shoulder and forward head posture   LOWER EXTREMITY MMT:  MMT Right eval Left eval  Hip flexion 4 4  Hip extension 4- 4-  Hip abduction 4- 4-  Knee flexion 4+ 4+  Knee extension 4+ 4+   FUNCTIONAL TESTS:  5 times sit to stand: 16 seconds Berg Balance Scale: 44/56  (see below)  BERG BALANCE TEST Sitting to Standing: 4.      Stands without using hands and stabilize independently Standing Unsupported: 4.      Stands safely for 2 minutes Sitting Unsupported: 4.     Sits for 2 minutes independently Standing to Sitting: 4.     Sits safely with minimal use of hands Transfers: 4.     Transfers safely with minor use of hands Standing with eyes closed: 4.      Stands safely for 10 seconds  Standing with feet together: 4.     Stands for 1 minute safely Reaching forward with outstretched arm: 3.     Reaches forward 5 inches Retrieving object from the floor: 3.     Able to pick up with supervision Turning to look behind: 2.     Turns sideways only, maintains balance Turning 360 degrees: 2.     Able to turn slowly, but safely Place alternate foot on stool: 4.     Completes 8 steps in 20 seconds     Standing with one foot in front: 1.     Needs help to step, but can hold for 15 seconds Standing on one foot: 1.     Holds <3 seconds Total Score: 44/56  GAIT: Assistive device utilized: None Level of assistance:  Complete Independence Comments: Decreased gait speed, decreased step length, trendelenburg   TODAY'S TREATMENT: SLR x 10 each Bridge x 10 Sidelying hip abduction x 10 each Sit to stand x 10 without UE support Standing heel toe raises x 20 Romberg stance with head turns and nods x 10 each Tandem stance x 30 sec each   PATIENT EDUCATION:  Education details: Exam findings, POC, HEP Person educated: Patient Education method: Explanation, Demonstration, Tactile cues, Verbal cues, and Handouts Education comprehension: verbalized understanding, returned demonstration, verbal cues required, tactile cues required, and needs further education  HOME EXERCISE PROGRAM: Access Code: [email or web address]   ASSESSMENT: CLINICAL IMPRESSION: Patient is a 81 y.o. female who was seen today for physical therapy evaluation and treatment for balance impairment. Patient does exhibit a great risk of fall based on BERG balance score of <45 and 5xSTS score >12 seconds. She also demonstrates gross strength deficits of her hips and knees likely contributing to gait and balance deficits.   OBJECTIVE IMPAIRMENTS Abnormal gait, decreased balance, and decreased strength.   ACTIVITY LIMITATIONS standing, stairs, and locomotion level  PARTICIPATION LIMITATIONS: community  activity  PERSONAL FACTORS Age, Fitness, Past/current experiences, and Time since onset of injury/illness/exacerbation are also affecting patient's functional outcome.   REHAB POTENTIAL: Good  CLINICAL DECISION MAKING: Stable/uncomplicated  EVALUATION COMPLEXITY: Low   GOALS: Goals reviewed with patient? Yes  SHORT TERM GOALS: Target date: 02/20/2022   Patient will be I with initial HEP in order to progress with therapy. Baseline: HEP provided at eval Goal status: INITIAL  2.  PT will review FOTO with patient by 3rd visit in order to understand expected progress and outcome with therapy. Baseline:  Goal status: INITIAL  3.  Patient will perform 5xSTS in </= 11 seconds in order to indicate reduced fall risk and improved LE strength. Baseline: 16 seconds Goal status: INITIAL  LONG TERM GOALS: Target date: 03/20/2022   Patient will be I with final HEP to maintain progress from PT. Baseline: HEP provided at eval Goal status: INITIAL  2.  Patient will report >/= 57% status on FOTO to indicate improved functional ability. Baseline: 55% functional status Goal status: INITIAL  3.  Patient will demonstrate BERG balance >/= 50/56 in order to indicate reduced fall risk and improved balance. Baseline: 44/56 Goal status: INITIAL  4.  Patient will demonstrate hip strength >/= 4/5 MMT and knee strength = 5/5 MMT in order to improve safety with stair negotiation. Baseline: hip strength 4-/5 and knee strength 4+/5 MMT Goal status: INITIAL   PLAN: PT FREQUENCY: 1-2x/week  PT DURATION: 8 weeks  PLANNED INTERVENTIONS: Therapeutic exercises, Therapeutic activity, Neuromuscular re-education, Balance training, Gait training, Patient/Family education, Self Care, Joint mobilization, Stair training, Aquatic Therapy, Dry Needling, Manual therapy, and Re-evaluation  PLAN FOR NEXT SESSION: Review HEP and progress PRN, progress hip and LE strengthening, dynamic balance training, unstable  surfaces, step training   Hilda Blades, PT, DPT, LAT, ATC 01/23/22  5:20 PM Phone: [phone] Fax: [phone]

## 2022-01-29 ENCOUNTER — Other Ambulatory Visit: Payer: Self-pay

## 2022-01-29 ENCOUNTER — Inpatient Hospital Stay: Payer: Medicare HMO | Attending: Gynecologic Oncology

## 2022-01-29 VITALS — BP 149/59 | HR 68 | Temp 98.0°F | Resp 18 | Wt 128.8 lb

## 2022-01-29 DIAGNOSIS — Z90722 Acquired absence of ovaries, bilateral: Secondary | ICD-10-CM | POA: Insufficient documentation

## 2022-01-29 DIAGNOSIS — N183 Chronic kidney disease, stage 3 unspecified: Secondary | ICD-10-CM | POA: Insufficient documentation

## 2022-01-29 DIAGNOSIS — C773 Secondary and unspecified malignant neoplasm of axilla and upper limb lymph nodes: Secondary | ICD-10-CM | POA: Insufficient documentation

## 2022-01-29 DIAGNOSIS — C541 Malignant neoplasm of endometrium: Secondary | ICD-10-CM | POA: Diagnosis present

## 2022-01-29 DIAGNOSIS — Z853 Personal history of malignant neoplasm of breast: Secondary | ICD-10-CM | POA: Insufficient documentation

## 2022-01-29 DIAGNOSIS — Z9079 Acquired absence of other genital organ(s): Secondary | ICD-10-CM | POA: Diagnosis not present

## 2022-01-29 DIAGNOSIS — Z90710 Acquired absence of both cervix and uterus: Secondary | ICD-10-CM | POA: Diagnosis not present

## 2022-01-29 DIAGNOSIS — Z5112 Encounter for antineoplastic immunotherapy: Secondary | ICD-10-CM | POA: Diagnosis present

## 2022-01-29 MED ORDER — ACETAMINOPHEN 325 MG PO TABS
650.0000 mg | ORAL_TABLET | Freq: Once | ORAL | Status: AC
  Administered 2022-01-29: 650 mg via ORAL
  Filled 2022-01-29: qty 2

## 2022-01-29 MED ORDER — DIPHENHYDRAMINE HCL 25 MG PO CAPS
25.0000 mg | ORAL_CAPSULE | Freq: Once | ORAL | Status: AC
  Administered 2022-01-29: 25 mg via ORAL
  Filled 2022-01-29: qty 1

## 2022-01-29 MED ORDER — TRASTUZUMAB-ANNS CHEMO 150 MG IV SOLR
6.0000 mg/kg | Freq: Once | INTRAVENOUS | Status: AC
  Administered 2022-01-29: 357 mg via INTRAVENOUS
  Filled 2022-01-29: qty 17

## 2022-01-29 MED ORDER — SODIUM CHLORIDE 0.9 % IV SOLN: Freq: Once | INTRAVENOUS | Status: AC

## 2022-01-29 MED ORDER — HEPARIN SOD (PORK) LOCK FLUSH 100 UNIT/ML IV SOLN
500.0000 [IU] | Freq: Once | INTRAVENOUS | Status: AC | PRN
  Administered 2022-01-29: 500 [IU]

## 2022-01-29 MED ORDER — SODIUM CHLORIDE 0.9% FLUSH
10.0000 mL | INTRAVENOUS | Status: DC | PRN
  Administered 2022-01-29: 10 mL

## 2022-01-29 NOTE — Patient Instructions (Signed)
[name]   Discharge Instructions: Thank you for choosing [facility] to provide your oncology and hematology care.   If you have a lab appointment with the [facility], please go directly to the [facility] and check in at the registration area.   Wear comfortable clothing and clothing appropriate for easy access to any Portacath or PICC line.   We strive to give you quality time with your provider. You may need to reschedule your appointment if you arrive late (15 or more minutes).  Arriving late affects you and other patients whose appointments are after yours.  Also, if you miss three or more appointments without notifying the office, you may be dismissed from the clinic at the provider's discretion.      For prescription refill requests, have your pharmacy contact our office and allow 72 hours for refills to be completed.    Today you received the following chemotherapy and/or immunotherapy agents: Trastuzumab ([name])       To help prevent nausea and vomiting after your treatment, we encourage you to take your nausea medication as directed.  BELOW ARE SYMPTOMS THAT SHOULD BE REPORTED IMMEDIATELY: *FEVER GREATER THAN 100.4 F (38 C) OR HIGHER *CHILLS OR SWEATING *NAUSEA AND VOMITING THAT IS NOT CONTROLLED WITH YOUR NAUSEA MEDICATION *UNUSUAL SHORTNESS OF BREATH *UNUSUAL BRUISING OR BLEEDING *URINARY PROBLEMS (pain or burning when urinating, or frequent urination) *BOWEL PROBLEMS (unusual diarrhea, constipation, pain near the anus) TENDERNESS IN MOUTH AND THROAT WITH OR WITHOUT PRESENCE OF ULCERS (sore throat, sores in mouth, or a toothache) UNUSUAL RASH, SWELLING OR PAIN  UNUSUAL VAGINAL DISCHARGE OR ITCHING   Items with * indicate a potential emergency and should be followed up as soon as possible or go to the Emergency Department if any problems should occur.  Please show the CHEMOTHERAPY ALERT CARD or IMMUNOTHERAPY ALERT  CARD at check-in to the Emergency Department and triage nurse.  Should you have questions after your visit or need to cancel or reschedule your appointment, please contact [facility]  Dept: [phone]  and follow the prompts.  Office hours are 8:00 a.m. to 4:30 p.m. Monday - Friday. Please note that voicemails left after 4:00 p.m. may not be returned until the following business day.  We are closed weekends and major holidays. You have access to a nurse at all times for urgent questions. Please call the main number to the clinic Dept: [phone] and follow the prompts.   For any non-urgent questions, you may also contact your provider using MyChart. We now offer e-Visits for anyone 53 and older to request care online for non-urgent symptoms. For details visit [email or web address].   Also download the MyChart app! Go to the app store, search "MyChart", open the app, select [facility], and log in with your MyChart username and password.  Due to Covid, a mask is required upon entering the hospital/clinic. If you do not have a mask, one will be given to you upon arrival. For doctor visits, patients may have 1 support Vicki Cervantes aged 82 or older with them. For treatment visits, patients cannot have anyone with them due to current Covid guidelines and our immunocompromised population.

## 2022-01-30 ENCOUNTER — Ambulatory Visit: Payer: Medicare HMO

## 2022-02-04 ENCOUNTER — Other Ambulatory Visit: Payer: Self-pay

## 2022-02-07 ENCOUNTER — Other Ambulatory Visit: Payer: Self-pay

## 2022-02-12 NOTE — Therapy (Signed)
OUTPATIENT PHYSICAL THERAPY TREATMENT NOTE   Patient Name: Vicki Cervantes MRN: [id number] DOB:[birth year], 81 y.o., female Today's Date: 02/13/2022  PCP: Burnard Bunting, MD   REFERRING PROVIDER: Burnard Bunting, MD   END OF SESSION:   PT End of Session - 02/13/22 1500     Visit Number 2    Number of Visits 9    Date for PT Re-Evaluation 03/20/22    Authorization Type Aetna MCR    Authorization Time Period FOTO by 6th    PT Start Time 1445    PT Stop Time 1530    PT Time Calculation (min) 45 min    Activity Tolerance Patient tolerated treatment well    Behavior During Therapy [facility] for tasks assessed/performed             Past Medical History:  Diagnosis Date   Aortic atherosclerosis ([place])    Arthritis    Blood transfusion without reported diagnosis    Breast cancer ([place])     LEFT, '82-left breast cancer-surgery only   Cataract    removed both eyes   Complication of anesthesia    nausea   Diverticulosis    Esophageal stricture    Family history of stomach cancer    GERD (gastroesophageal reflux disease)    Hemorrhoids    Hiatal hernia    High triglycerides    Hypercholesteremia    Hypertension    Hypothyroidism    Osteoarthritis    PONV (postoperative nausea and vomiting)    Uterine cancer ([place])    Past Surgical History:  Procedure Laterality Date   COLONOSCOPY     EUS N/A 01/07/2013   Procedure: UPPER ENDOSCOPIC ULTRASOUND (EUS) LINEAR;  Surgeon: Milus Banister, MD;  Location: [facility];  Service: Endoscopy;  Laterality: N/A;   EUS N/A 01/05/2015   Procedure: UPPER ENDOSCOPIC ULTRASOUND (EUS) LINEAR;  Surgeon: Milus Banister, MD;  Location: [facility];  Service: Endoscopy;  Laterality: N/A;   FOOT SURGERY Left 2009-2010   x2. Surgery in 2011   IR IMAGING GUIDED PORT INSERTION  10/19/2018   LEFT HEART CATHETERIZATION WITH CORONARY ANGIOGRAM N/A 01/24/2014   Procedure: LEFT HEART CATHETERIZATION WITH CORONARY ANGIOGRAM;  Surgeon: Peter M Martinique, MD;   Location[facility] CATH LAB;  Service: Cardiovascular;  Laterality: N/A;   LUMBAR DISC SURGERY  01-01-13   MASTECTOMY, RADICAL Left 1982   OVARY SURGERY  1972-1973   ROBOTIC ASSISTED TOTAL HYSTERECTOMY WITH BILATERAL SALPINGO OOPHERECTOMY N/A 10/22/2016   Procedure: XI ROBOTIC ASSISTED TOTAL HYSTERECTOMY WITH RIGHT SALPINGO OOPHORECTOMY; INJECTION OF GREEN DYE WITH EXCISION OF BILATERAL PELVIC LYMPH NODES;  Surgeon: Janie Morning, MD;  Location: [facility];  Service: Gynecology;  Laterality: N/A;   ROTATOR CUFF REPAIR Left    SENTINEL NODE BIOPSY N/A 10/22/2016   Procedure: SENTINEL NODE BIOPSY;  Surgeon: Janie Morning, MD;  Location: [facility];  Service: Gynecology;  Laterality: N/A;   SIMPLE MASTECTOMY Left 1982   TOTAL KNEE ARTHROPLASTY Bilateral 2009-2010   x2   UPPER GASTROINTESTINAL ENDOSCOPY     Patient Active Problem List   Diagnosis Date Noted   Osteopenia 10/31/2020   Hepatic steatosis 09/19/2020   Preventive measure 03/07/2020   Vaginitis 01/20/2020   Hypokalemia due to excessive renal loss of potassium 06/17/2019   Deficiency anemia 03/24/2019   Hypokalemia due to inadequate potassium intake 03/24/2019   Hypomagnesemia 03/24/2019   Physical debility 03/24/2019   Pancytopenia, acquired ([place]) 02/03/2019   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   Bone pain  11/10/2018   Encounter for antineoplastic chemotherapy 10/20/2018   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   Goals of care, counseling/discussion 10/14/2018   CKD (chronic kidney disease), stage III ([place]) 10/14/2018   Genetic testing 07/14/2017   History of breast cancer 06/25/2017   Family history of stomach cancer    Vaginal mass 02/13/2017   Thyroid nodule 02/13/2017   Pulmonary nodules/lesions, multiple 02/13/2017   Endometrial cancer ([place]) 09/30/2016   Essential hypertension 01/17/2014   Chest pain at rest 01/17/2014   Hyperlipidemia 04/03/2011   OTHER SPECIFIED DISORDER OF STOMACH AND DUODENUM 10/19/2009     REFERRING DIAG: Other abnormalities of gait and mobility  THERAPY DIAG:  Muscle weakness (generalized)  Other abnormalities of gait and mobility  Rationale for Evaluation and Treatment Rehabilitation  PERTINENT HISTORY: History of breast cancer, uterine cancer  PRECAUTIONS: None  SUBJECTIVE: Patient reports the sidelying leg lift caused her back to hurt, and she can't do all her exercises at once because it bothers her knees, and she forgot to do the tandem balance exercises.  PAIN:  Are you having pain? No  PATIENT GOALS: Improve balance   OBJECTIVE: (objective measures completed at initial evaluation unless otherwise dated) PATIENT SURVEYS:  FOTO 55% functional status   MUSCLE LENGTH: Not assessed   POSTURE:            Slightly rounded shoulder and forward head posture    LOWER EXTREMITY MMT:   MMT Right eval Left eval  Hip flexion 4 4  Hip extension 4- 4-  Hip abduction 4- 4-  Knee flexion 4+ 4+  Knee extension 4+ 4+    FUNCTIONAL TESTS:  5 times sit to stand: 16 seconds Berg Balance Scale: 44/56 (see initial eval for score details)   GAIT: Assistive device utilized: None Level of assistance: Complete Independence Comments: Decreased gait speed, decreased step length, trendelenburg     TODAY'S TREATMENT: [facility] Adult PT Treatment:                                                DATE: 02/13/2022 Therapeutic Exercise: NuStep L4 x 5 min with UE/LE while taking subjective Sidelying hip abduction 2 x 10 each SLR x 10 each Bridge x 10 Sit to stand x 10 without UE support Standing heel toe raises x 10 Neuro Reed: Romberg stance on Airex with head turns and nods 2 x 10 each Tandem stance 3 x 30 sec each Rocker board forward/backward taps x 2 min   [facility] Adult PT Treatment:                                                DATE: 01/23/2022 Therapeutic Exercise: SLR x 10 each Bridge x 10 Sidelying hip abduction x 10 each Sit to stand x 10 without UE  support Standing heel toe raises x 20 Romberg stance with head turns and nods x 10 each Tandem stance x 30 sec each   PATIENT EDUCATION:  Education details: HEP Person educated: Patient Education method: Consulting civil engineer, Demonstration, Corporate treasurer cues, Verbal cues Education comprehension: verbalized understanding, returned demonstration, verbal cues required, tactile cues required, and needs further education   HOME EXERCISE PROGRAM: Access Code: [email or web address]     ASSESSMENT: CLINICAL  IMPRESSION: Patient tolerated therapy well with no adverse effects. Therapy focused on continued progression of strengthening and balance exercises. She required cueing for for HEP exercises as she did report pain while doing them at home. She was able to progress with balance training with increased sway and occasional UE support needed to maintain balance. Patient would benefit from continued skilled PT to progress her strength and balance to improve gait and reduce fall risk and maximize functional ability.     OBJECTIVE IMPAIRMENTS Abnormal gait, decreased balance, and decreased strength.    ACTIVITY LIMITATIONS standing, stairs, and locomotion level   PARTICIPATION LIMITATIONS: community activity   PERSONAL FACTORS Age, Fitness, Past/current experiences, and Time since onset of injury/illness/exacerbation are also affecting patient's functional outcome.      GOALS: Goals reviewed with patient? Yes   SHORT TERM GOALS: Target date: 02/20/2022    Patient will be I with initial HEP in order to progress with therapy. Baseline: HEP provided at eval Goal status: INITIAL   2.  PT will review FOTO with patient by 3rd visit in order to understand expected progress and outcome with therapy. Baseline:  Goal status: INITIAL   3.  Patient will perform 5xSTS in </= 11 seconds in order to indicate reduced fall risk and improved LE strength. Baseline: 16 seconds Goal status: INITIAL   LONG TERM GOALS: Target date:  03/20/2022    Patient will be I with final HEP to maintain progress from PT. Baseline: HEP provided at eval Goal status: INITIAL   2.  Patient will report >/= 57% status on FOTO to indicate improved functional ability. Baseline: 55% functional status Goal status: INITIAL   3.  Patient will demonstrate BERG balance >/= 50/56 in order to indicate reduced fall risk and improved balance. Baseline: 44/56 Goal status: INITIAL   4.  Patient will demonstrate hip strength >/= 4/5 MMT and knee strength = 5/5 MMT in order to improve safety with stair negotiation. Baseline: hip strength 4-/5 and knee strength 4+/5 MMT Goal status: INITIAL     PLAN: PT FREQUENCY: 1-2x/week   PT DURATION: 8 weeks   PLANNED INTERVENTIONS: Therapeutic exercises, Therapeutic activity, Neuromuscular re-education, Balance training, Gait training, Patient/Family education, Self Care, Joint mobilization, Stair training, Aquatic Therapy, Dry Needling, Manual therapy, and Re-evaluation   PLAN FOR NEXT SESSION: Review HEP and progress PRN, progress hip and LE strengthening, dynamic balance training, unstable surfaces, step training    Hilda Blades, PT, DPT, LAT, ATC 02/13/22  3:34 PM Phone: [phone] Fax: [phone]

## 2022-02-13 ENCOUNTER — Encounter: Payer: Self-pay | Admitting: Physical Therapy

## 2022-02-13 ENCOUNTER — Ambulatory Visit: Payer: Medicare HMO | Attending: Internal Medicine | Admitting: Physical Therapy

## 2022-02-13 ENCOUNTER — Other Ambulatory Visit: Payer: Self-pay

## 2022-02-13 DIAGNOSIS — C490 Malignant neoplasm of connective and soft tissue of head, face and neck: Secondary | ICD-10-CM | POA: Diagnosis not present

## 2022-02-13 DIAGNOSIS — L570 Actinic keratosis: Secondary | ICD-10-CM | POA: Diagnosis not present

## 2022-02-13 DIAGNOSIS — D485 Neoplasm of uncertain behavior of skin: Secondary | ICD-10-CM | POA: Diagnosis not present

## 2022-02-13 DIAGNOSIS — M6281 Muscle weakness (generalized): Secondary | ICD-10-CM | POA: Diagnosis not present

## 2022-02-13 DIAGNOSIS — R2689 Other abnormalities of gait and mobility: Secondary | ICD-10-CM | POA: Insufficient documentation

## 2022-02-13 DIAGNOSIS — L820 Inflamed seborrheic keratosis: Secondary | ICD-10-CM | POA: Diagnosis not present

## 2022-02-13 DIAGNOSIS — L821 Other seborrheic keratosis: Secondary | ICD-10-CM | POA: Diagnosis not present

## 2022-02-19 ENCOUNTER — Other Ambulatory Visit: Payer: Self-pay

## 2022-02-19 ENCOUNTER — Inpatient Hospital Stay: Payer: Medicare HMO | Attending: Gynecologic Oncology

## 2022-02-19 VITALS — BP 157/69 | HR 70 | Temp 98.2°F | Wt 130.1 lb

## 2022-02-19 DIAGNOSIS — N183 Chronic kidney disease, stage 3 unspecified: Secondary | ICD-10-CM | POA: Diagnosis not present

## 2022-02-19 DIAGNOSIS — Z79899 Other long term (current) drug therapy: Secondary | ICD-10-CM | POA: Diagnosis not present

## 2022-02-19 DIAGNOSIS — Z7982 Long term (current) use of aspirin: Secondary | ICD-10-CM | POA: Diagnosis not present

## 2022-02-19 DIAGNOSIS — I10 Essential (primary) hypertension: Secondary | ICD-10-CM | POA: Diagnosis not present

## 2022-02-19 DIAGNOSIS — C773 Secondary and unspecified malignant neoplasm of axilla and upper limb lymph nodes: Secondary | ICD-10-CM | POA: Insufficient documentation

## 2022-02-19 DIAGNOSIS — C541 Malignant neoplasm of endometrium: Secondary | ICD-10-CM | POA: Insufficient documentation

## 2022-02-19 DIAGNOSIS — Z5112 Encounter for antineoplastic immunotherapy: Secondary | ICD-10-CM | POA: Insufficient documentation

## 2022-02-19 MED ORDER — ACETAMINOPHEN 325 MG PO TABS
650.0000 mg | ORAL_TABLET | Freq: Once | ORAL | Status: AC
  Administered 2022-02-19: 650 mg via ORAL
  Filled 2022-02-19: qty 2

## 2022-02-19 MED ORDER — SODIUM CHLORIDE 0.9 % IV SOLN: Freq: Once | INTRAVENOUS | Status: AC

## 2022-02-19 MED ORDER — DIPHENHYDRAMINE HCL 25 MG PO CAPS
25.0000 mg | ORAL_CAPSULE | Freq: Once | ORAL | Status: AC
  Administered 2022-02-19: 25 mg via ORAL
  Filled 2022-02-19: qty 1

## 2022-02-19 MED ORDER — HEPARIN SOD (PORK) LOCK FLUSH 100 UNIT/ML IV SOLN
500.0000 [IU] | Freq: Once | INTRAVENOUS | Status: AC | PRN
  Administered 2022-02-19: 500 [IU]

## 2022-02-19 MED ORDER — TRASTUZUMAB-ANNS CHEMO 150 MG IV SOLR
6.0000 mg/kg | Freq: Once | INTRAVENOUS | Status: AC
  Administered 2022-02-19: 357 mg via INTRAVENOUS
  Filled 2022-02-19: qty 17

## 2022-02-19 MED ORDER — SODIUM CHLORIDE 0.9% FLUSH
10.0000 mL | INTRAVENOUS | Status: DC | PRN
  Administered 2022-02-19: 10 mL

## 2022-02-19 NOTE — Patient Instructions (Signed)
[facility] ONCOLOGY  Discharge Instructions: Thank you for choosing [facility] to provide your oncology and hematology care.   If you have a lab appointment with the [facility], please go directly to the [facility] and check in at the registration area.   Wear comfortable clothing and clothing appropriate for easy access to any Portacath or PICC line.   We strive to give you quality time with your provider. You may need to reschedule your appointment if you arrive late (15 or more minutes).  Arriving late affects you and other patients whose appointments are after yours.  Also, if you miss three or more appointments without notifying the office, you may be dismissed from the clinic at the provider's discretion.      For prescription refill requests, have your pharmacy contact our office and allow 72 hours for refills to be completed.    Today you received the following chemotherapy and/or immunotherapy agent: Kanjinti      To help prevent nausea and vomiting after your treatment, we encourage you to take your nausea medication as directed.  BELOW ARE SYMPTOMS THAT SHOULD BE REPORTED IMMEDIATELY: *FEVER GREATER THAN 100.4 F (38 C) OR HIGHER *CHILLS OR SWEATING *NAUSEA AND VOMITING THAT IS NOT CONTROLLED WITH YOUR NAUSEA MEDICATION *UNUSUAL SHORTNESS OF BREATH *UNUSUAL BRUISING OR BLEEDING *URINARY PROBLEMS (pain or burning when urinating, or frequent urination) *BOWEL PROBLEMS (unusual diarrhea, constipation, pain near the anus) TENDERNESS IN MOUTH AND THROAT WITH OR WITHOUT PRESENCE OF ULCERS (sore throat, sores in mouth, or a toothache) UNUSUAL RASH, SWELLING OR PAIN  UNUSUAL VAGINAL DISCHARGE OR ITCHING   Items with * indicate a potential emergency and should be followed up as soon as possible or go to the Emergency Department if any problems should occur.  Please show the CHEMOTHERAPY ALERT CARD or IMMUNOTHERAPY ALERT CARD at check-in to  the Emergency Department and triage nurse.  Should you have questions after your visit or need to cancel or reschedule your appointment, please contact [facility]  Dept: [phone]  and follow the prompts.  Office hours are 8:00 a.m. to 4:30 p.m. Monday - Friday. Please note that voicemails left after 4:00 p.m. may not be returned until the following business day.  We are closed weekends and major holidays. You have access to a nurse at all times for urgent questions. Please call the main number to the clinic Dept: [phone] and follow the prompts.   For any non-urgent questions, you may also contact your provider using MyChart. We now offer e-Visits for anyone 20 and older to request care online for non-urgent symptoms. For details visit [email or web address].   Also download the MyChart app! Go to the app store, search "MyChart", open the app, select [facility], and log in with your MyChart username and password.  Masks are optional in the cancer centers. If you would like for your care team to wear a mask while they are taking care of you, please let them know. You may have one support person who is at least 82 years old accompany you for your appointments.

## 2022-02-20 ENCOUNTER — Ambulatory Visit: Payer: Medicare HMO

## 2022-02-26 NOTE — Therapy (Addendum)
OUTPATIENT PHYSICAL THERAPY TREATMENT NOTE  DISCHARGE   Patient Name: Vicki Cervantes MRN: [id number] DOB:February 09, 1941, 81 y.o., female Today's Date: 02/27/2022  PCP: Burnard Bunting, MD   REFERRING PROVIDER: Burnard Bunting, MD   END OF SESSION:   PT End of Session - 02/27/22 1450     Visit Number 3    Number of Visits 9    Date for PT Re-Evaluation 03/20/22    Authorization Type Aetna MCR    Authorization Time Period FOTO by 6th    PT Start Time 1450    PT Stop Time 1530    PT Time Calculation (min) 40 min    Activity Tolerance Patient tolerated treatment well    Behavior During Therapy [facility] for tasks assessed/performed              Past Medical History:  Diagnosis Date   Aortic atherosclerosis ([place])    Arthritis    Blood transfusion without reported diagnosis    Breast cancer ([place])     LEFT, '82-left breast cancer-surgery only   Cataract    removed both eyes   Complication of anesthesia    nausea   Diverticulosis    Esophageal stricture    Family history of stomach cancer    GERD (gastroesophageal reflux disease)    Hemorrhoids    Hiatal hernia    High triglycerides    Hypercholesteremia    Hypertension    Hypothyroidism    Osteoarthritis    PONV (postoperative nausea and vomiting)    Uterine cancer ([place])    Past Surgical History:  Procedure Laterality Date   COLONOSCOPY     EUS N/A 01/07/2013   Procedure: UPPER ENDOSCOPIC ULTRASOUND (EUS) LINEAR;  Surgeon: Milus Banister, MD;  [facility];  Service: Endoscopy;  Laterality: N/A;   EUS N/A 01/05/2015   Procedure: UPPER ENDOSCOPIC ULTRASOUND (EUS) LINEAR;  Surgeon: Milus Banister, MD;  [facility];  Service: Endoscopy;  Laterality: N/A;   FOOT SURGERY Left 2009-2010   x2. Surgery in 2011   IR IMAGING GUIDED PORT INSERTION  10/19/2018   LEFT HEART CATHETERIZATION WITH CORONARY ANGIOGRAM N/A 01/24/2014   Procedure: LEFT HEART CATHETERIZATION WITH CORONARY ANGIOGRAM;  Surgeon:  Peter M Martinique, MD;  Location: [facility] CATH LAB;  Service: Cardiovascular;  Laterality: N/A;   LUMBAR DISC SURGERY  01-01-13   MASTECTOMY, RADICAL Left 1982   OVARY SURGERY  1972-1973   ROBOTIC ASSISTED TOTAL HYSTERECTOMY WITH BILATERAL SALPINGO OOPHERECTOMY N/A 10/22/2016   Procedure: XI ROBOTIC ASSISTED TOTAL HYSTERECTOMY WITH RIGHT SALPINGO OOPHORECTOMY; INJECTION OF GREEN DYE WITH EXCISION OF BILATERAL PELVIC LYMPH NODES;  Surgeon: Janie Morning, MD;  [facility];  Service: Gynecology;  Laterality: N/A;   ROTATOR CUFF REPAIR Left    SENTINEL NODE BIOPSY N/A 10/22/2016   Procedure: SENTINEL NODE BIOPSY;  Surgeon: Janie Morning, MD;  [facility];  Service: Gynecology;  Laterality: N/A;   SIMPLE MASTECTOMY Left 1982   TOTAL KNEE ARTHROPLASTY Bilateral 2009-2010   x2   UPPER GASTROINTESTINAL ENDOSCOPY     Patient Active Problem List   Diagnosis Date Noted   Osteopenia 10/31/2020   Hepatic steatosis 09/19/2020   Preventive measure 03/07/2020   Vaginitis 01/20/2020   Hypokalemia due to excessive renal loss of potassium 06/17/2019   Deficiency anemia 03/24/2019   Hypokalemia due to inadequate potassium intake 03/24/2019   Hypomagnesemia 03/24/2019   Physical debility 03/24/2019   Pancytopenia, acquired ([place]) 02/03/2019   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  Bone pain 11/10/2018   Encounter for antineoplastic chemotherapy 10/20/2018   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   Goals of care, counseling/discussion 10/14/2018   CKD (chronic kidney disease), stage III ([place]) 10/14/2018   Genetic testing 07/14/2017   History of breast cancer 06/25/2017   Family history of stomach cancer    Vaginal mass 02/13/2017   Thyroid nodule 02/13/2017   Pulmonary nodules/lesions, multiple 02/13/2017   Endometrial cancer ([place]) 09/30/2016   Essential hypertension 01/17/2014   Chest pain at rest 01/17/2014   Hyperlipidemia 04/03/2011   OTHER SPECIFIED DISORDER OF STOMACH AND DUODENUM  10/19/2009    REFERRING DIAG: Other abnormalities of gait and mobility  THERAPY DIAG:  Muscle weakness (generalized)  Other abnormalities of gait and mobility  Rationale for Evaluation and Treatment Rehabilitation  PERTINENT HISTORY: History of breast cancer, uterine cancer  PRECAUTIONS: None  SUBJECTIVE: Patient reports she is doing well. She has been out of town recently. She reports balance is going pretty good. She is able to perform her balance exercises but is unable to do tandem walking at home.  PAIN:  Are you having pain? No  PATIENT GOALS: Improve balance   OBJECTIVE: (objective measures completed at initial evaluation unless otherwise dated) PATIENT SURVEYS:  FOTO 55% functional status   MUSCLE LENGTH: Not assessed   POSTURE:            Slightly rounded shoulder and forward head posture    LOWER EXTREMITY MMT:   MMT Right eval Left eval  Hip flexion 4 4  Hip extension 4- 4-  Hip abduction 4- 4-  Knee flexion 4+ 4+  Knee extension 4+ 4+    FUNCTIONAL TESTS:  5 times sit to stand: 16 seconds Berg Balance Scale: 44/56 (see initial eval for score details)   GAIT: Assistive device utilized: None Level of assistance: Complete Independence Comments: Decreased gait speed, decreased step length, trendelenburg     TODAY'S TREATMENT: [facility] Adult PT Treatment:                                                DATE: 02/27/2022 Therapeutic Exercise: NuStep L6 x 5 min with UE/LE while taking subjective Standing heel toe raises 2 x 20 Sidestepping in // bars with red at knees 2 x 3 lengths down and back Sit to stand 2 x 10 without UE support LAQ with 3# 2 x 15 each Bridge 2 x 10 SLR 2 x 10 each Neuro Reed: Tandem stance 3 x 30 sec each Romberg stance on Airex with EC x 30 sec Romberg stance on Airex with head turns and nods 2 x 10 each Tandem walking in // bars x 2 length down and back   [facility] Adult PT Treatment:                                                 DATE: 02/13/2022 Therapeutic Exercise: NuStep L4 x 5 min with UE/LE while taking subjective Sidelying hip abduction 2 x 10 each SLR x 10 each Bridge x 10 Sit to stand x 10 without UE support Standing heel toe raises x 10 Neuro Reed: Romberg stance on Airex with head turns and nods 2 x 10 each Tandem stance  3 x 30 sec each Rocker board forward/backward taps x 2 min  [facility] Adult PT Treatment:                                                DATE: 01/23/2022 Therapeutic Exercise: SLR x 10 each Bridge x 10 Sidelying hip abduction x 10 each Sit to stand x 10 without UE support Standing heel toe raises x 20 Romberg stance with head turns and nods x 10 each Tandem stance x 30 sec each   PATIENT EDUCATION:  Education details: HEP update Person educated: Patient Education method: Explanation, Demonstration, Tactile cues, Verbal cues, Handout Education comprehension: verbalized understanding, returned demonstration, verbal cues required, tactile cues required, and needs further education   HOME EXERCISE PROGRAM: Access Code: [email or web address]     ASSESSMENT: CLINICAL IMPRESSION: Patient tolerated therapy well with no adverse effects. She demonstrates improve 5xSTS this visit but still with time that indicates higher fall risk. Therapy continues to focus on balance training and progression of strengthening exercises with good tolerance. She continues to exhibit increased sway with unstable surface and required frequent use of UE for support with EC and tandem walking. Updated HEP this visit. Patient would benefit from continued skilled PT to progress her strength and balance to improve gait and reduce fall risk and maximize functional ability.     OBJECTIVE IMPAIRMENTS Abnormal gait, decreased balance, and decreased strength.    ACTIVITY LIMITATIONS standing, stairs, and locomotion level   PARTICIPATION LIMITATIONS: community activity   PERSONAL FACTORS Age, Fitness, Past/current experiences,  and Time since onset of injury/illness/exacerbation are also affecting patient's functional outcome.      GOALS: Goals reviewed with patient? Yes   SHORT TERM GOALS: Target date: 02/20/2022    Patient will be I with initial HEP in order to progress with therapy. Baseline: HEP provided at eval 02/27/2022: independent Goal status: MET   2.  PT will review FOTO with patient by 3rd visit in order to understand expected progress and outcome with therapy. Baseline:  02/27/2022: reviewed Goal status: MET   3.  Patient will perform 5xSTS in </= 11 seconds in order to indicate reduced fall risk and improved LE strength. Baseline: 16 seconds 02/27/2022: 12 seconds Goal status: PARTIALLY MET   LONG TERM GOALS: Target date: 03/20/2022    Patient will be I with final HEP to maintain progress from PT. Baseline: HEP provided at eval Goal status: INITIAL   2.  Patient will report >/= 57% status on FOTO to indicate improved functional ability. Baseline: 55% functional status Goal status: INITIAL   3.  Patient will demonstrate BERG balance >/= 50/56 in order to indicate reduced fall risk and improved balance. Baseline: 44/56 Goal status: INITIAL   4.  Patient will demonstrate hip strength >/= 4/5 MMT and knee strength = 5/5 MMT in order to improve safety with stair negotiation. Baseline: hip strength 4-/5 and knee strength 4+/5 MMT Goal status: INITIAL     PLAN: PT FREQUENCY: 1-2x/week   PT DURATION: 8 weeks   PLANNED INTERVENTIONS: Therapeutic exercises, Therapeutic activity, Neuromuscular re-education, Balance training, Gait training, Patient/Family education, Self Care, Joint mobilization, Stair training, Aquatic Therapy, Dry Needling, Manual therapy, and Re-evaluation   PLAN FOR NEXT SESSION: Review HEP and progress PRN, progress hip and LE strengthening, dynamic balance training, unstable surfaces, step training    Hilda Blades,  PT, DPT, LAT, ATC 02/27/22  3:32 PM Phone:  [phone] Fax: [phone]   PHYSICAL THERAPY DISCHARGE SUMMARY  Visits from Start of Care: 3  Current functional level related to goals / functional outcomes: See above   Remaining deficits: See above   Education / Equipment: HEP   Patient agrees to discharge. Patient goals were not met. Patient is being discharged due to not returning since the last visit.  Hilda Blades, PT, DPT, LAT, ATC 04/11/22  3:52 PM Phone: [phone] Fax: [phone]

## 2022-02-27 ENCOUNTER — Encounter: Payer: Self-pay | Admitting: Physical Therapy

## 2022-02-27 ENCOUNTER — Other Ambulatory Visit: Payer: Self-pay

## 2022-02-27 ENCOUNTER — Ambulatory Visit: Payer: Medicare HMO | Admitting: Physical Therapy

## 2022-02-27 DIAGNOSIS — R2689 Other abnormalities of gait and mobility: Secondary | ICD-10-CM | POA: Diagnosis not present

## 2022-02-27 DIAGNOSIS — M6281 Muscle weakness (generalized): Secondary | ICD-10-CM | POA: Diagnosis not present

## 2022-02-27 NOTE — Patient Instructions (Signed)
Access Code: [email or web address] URL: [email or web address] Date: 02/27/2022 Prepared by: Hilda Blades  Exercises - Sidelying Hip Abduction  - 1 x daily - 2 sets - 10 reps - Sit to Stand Without Arm Support  - 1 x daily - 3 sets - 10 reps - Heel Toe Raises with Counter Support  - 1 x daily - 2 sets - 20 reps - Romberg Stance with Head Rotation  - 1-2 x daily - 2 sets - 10 reps - Romberg Stance with Head Nods  - 1-2 x daily - 2 sets - 10 reps - Standing Tandem Balance with Counter Support  - 1-2 x daily - 2 sets - 30 seconds hold - Tandem Walking with Counter Support  - 1-2 x daily - 3 sets - 20 reps

## 2022-02-28 ENCOUNTER — Other Ambulatory Visit: Payer: Self-pay

## 2022-03-12 ENCOUNTER — Inpatient Hospital Stay: Payer: Medicare HMO

## 2022-03-12 ENCOUNTER — Telehealth: Payer: Self-pay

## 2022-03-12 ENCOUNTER — Other Ambulatory Visit: Payer: Self-pay

## 2022-03-12 ENCOUNTER — Inpatient Hospital Stay: Payer: Medicare HMO | Admitting: Hematology and Oncology

## 2022-03-12 VITALS — BP 129/69 | HR 69 | Resp 18 | Ht 62.0 in | Wt 133.2 lb

## 2022-03-12 DIAGNOSIS — Z7982 Long term (current) use of aspirin: Secondary | ICD-10-CM | POA: Diagnosis not present

## 2022-03-12 DIAGNOSIS — Z5112 Encounter for antineoplastic immunotherapy: Secondary | ICD-10-CM | POA: Diagnosis not present

## 2022-03-12 DIAGNOSIS — I10 Essential (primary) hypertension: Secondary | ICD-10-CM

## 2022-03-12 DIAGNOSIS — Z79899 Other long term (current) drug therapy: Secondary | ICD-10-CM | POA: Diagnosis not present

## 2022-03-12 DIAGNOSIS — C541 Malignant neoplasm of endometrium: Secondary | ICD-10-CM

## 2022-03-12 DIAGNOSIS — N183 Chronic kidney disease, stage 3 unspecified: Secondary | ICD-10-CM

## 2022-03-12 DIAGNOSIS — D630 Anemia in neoplastic disease: Secondary | ICD-10-CM | POA: Diagnosis not present

## 2022-03-12 DIAGNOSIS — Z7189 Other specified counseling: Secondary | ICD-10-CM

## 2022-03-12 DIAGNOSIS — C773 Secondary and unspecified malignant neoplasm of axilla and upper limb lymph nodes: Secondary | ICD-10-CM

## 2022-03-12 LAB — COMPREHENSIVE METABOLIC PANEL
ALT: 13 U/L (ref 0–44)
AST: 23 U/L (ref 15–41)
Albumin: 4.4 g/dL (ref 3.5–5.0)
Alkaline Phosphatase: 51 U/L (ref 38–126)
Anion gap: 9 (ref 5–15)
BUN: 29 mg/dL — ABNORMAL HIGH (ref 8–23)
CO2: 27 mmol/L (ref 22–32)
Calcium: 9.5 mg/dL (ref 8.9–10.3)
Chloride: 100 mmol/L (ref 98–111)
Creatinine, Ser: 1.28 mg/dL — ABNORMAL HIGH (ref 0.44–1.00)
GFR, Estimated: 42 mL/min — ABNORMAL LOW (ref >60)
Glucose, Bld: 106 mg/dL — ABNORMAL HIGH (ref 70–99)
Potassium: 3.5 mmol/L (ref 3.5–5.1)
Sodium: 136 mmol/L (ref 135–145)
Total Bilirubin: 0.4 mg/dL (ref 0.3–1.2)
Total Protein: 6.8 g/dL (ref 6.5–8.1)

## 2022-03-12 LAB — CBC WITH DIFFERENTIAL/PLATELET
Abs Immature Granulocytes: 0.04 10*3/uL (ref 0.00–0.07)
Basophils Absolute: 0.1 10*3/uL (ref 0.0–0.1)
Basophils Relative: 1 %
Eosinophils Absolute: 0.2 10*3/uL (ref 0.0–0.5)
Eosinophils Relative: 3 %
HCT: 30 % — ABNORMAL LOW (ref 36.0–46.0)
Hemoglobin: 10.9 g/dL — ABNORMAL LOW (ref 12.0–15.0)
Immature Granulocytes: 1 %
Lymphocytes Relative: 25 %
Lymphs Abs: 1.5 10*3/uL (ref 0.7–4.0)
MCH: 31.8 pg (ref 26.0–34.0)
MCHC: 36.3 g/dL — ABNORMAL HIGH (ref 30.0–36.0)
MCV: 87.5 fL (ref 80.0–100.0)
Monocytes Absolute: 0.6 10*3/uL (ref 0.1–1.0)
Monocytes Relative: 10 %
Neutro Abs: 3.8 10*3/uL (ref 1.7–7.7)
Neutrophils Relative %: 60 %
Platelets: 218 10*3/uL (ref 150–400)
RBC: 3.43 MIL/uL — ABNORMAL LOW (ref 3.87–5.11)
RDW: 11.9 % (ref 11.5–15.5)
WBC: 6.2 10*3/uL (ref 4.0–10.5)
nRBC: 0 % (ref 0.0–0.2)

## 2022-03-12 MED ORDER — DIPHENHYDRAMINE HCL 25 MG PO CAPS
25.0000 mg | ORAL_CAPSULE | Freq: Once | ORAL | Status: AC
  Administered 2022-03-12: 25 mg via ORAL
  Filled 2022-03-12: qty 1

## 2022-03-12 MED ORDER — SODIUM CHLORIDE 0.9 % IV SOLN: Freq: Once | INTRAVENOUS | Status: AC

## 2022-03-12 MED ORDER — SODIUM CHLORIDE 0.9% FLUSH
10.0000 mL | INTRAVENOUS | Status: DC | PRN
  Administered 2022-03-12: 10 mL

## 2022-03-12 MED ORDER — TRASTUZUMAB-ANNS CHEMO 150 MG IV SOLR
6.0000 mg/kg | Freq: Once | INTRAVENOUS | Status: AC
  Administered 2022-03-12: 357 mg via INTRAVENOUS
  Filled 2022-03-12: qty 17

## 2022-03-12 MED ORDER — SODIUM CHLORIDE 0.9% FLUSH
10.0000 mL | Freq: Once | INTRAVENOUS | Status: AC
  Administered 2022-03-12: 10 mL

## 2022-03-12 MED ORDER — HEPARIN SOD (PORK) LOCK FLUSH 100 UNIT/ML IV SOLN
500.0000 [IU] | Freq: Once | INTRAVENOUS | Status: AC | PRN
  Administered 2022-03-12: 500 [IU]

## 2022-03-12 MED ORDER — ACETAMINOPHEN 325 MG PO TABS
650.0000 mg | ORAL_TABLET | Freq: Once | ORAL | Status: AC
  Administered 2022-03-12: 650 mg via ORAL
  Filled 2022-03-12: qty 2

## 2022-03-12 NOTE — Patient Instructions (Signed)
[facility]  Discharge Instructions: Thank you for choosing [facility] to provide your oncology and hematology care.   If you have a lab appointment with the [facility], please go directly to the [facility] and check in at the registration area.   Wear comfortable clothing and clothing appropriate for easy access to any Portacath or PICC line.   We strive to give you quality time with your provider. You may need to reschedule your appointment if you arrive late (15 or more minutes).  Arriving late affects you and other patients whose appointments are after yours.  Also, if you miss three or more appointments without notifying the office, you may be dismissed from the clinic at the provider's discretion.      For prescription refill requests, have your pharmacy contact our office and allow 72 hours for refills to be completed.    Today you received the following chemotherapy and/or immunotherapy agent: Kanjinti      To help prevent nausea and vomiting after your treatment, we encourage you to take your nausea medication as directed.  BELOW ARE SYMPTOMS THAT SHOULD BE REPORTED IMMEDIATELY: *FEVER GREATER THAN 100.4 F (38 C) OR HIGHER *CHILLS OR SWEATING *NAUSEA AND VOMITING THAT IS NOT CONTROLLED WITH YOUR NAUSEA MEDICATION *UNUSUAL SHORTNESS OF BREATH *UNUSUAL BRUISING OR BLEEDING *URINARY PROBLEMS (pain or burning when urinating, or frequent urination) *BOWEL PROBLEMS (unusual diarrhea, constipation, pain near the anus) TENDERNESS IN MOUTH AND THROAT WITH OR WITHOUT PRESENCE OF ULCERS (sore throat, sores in mouth, or a toothache) UNUSUAL RASH, SWELLING OR PAIN  UNUSUAL VAGINAL DISCHARGE OR ITCHING   Items with * indicate a potential emergency and should be followed up as soon as possible or go to the Emergency Department if any problems should occur.  Please show the CHEMOTHERAPY ALERT CARD or IMMUNOTHERAPY ALERT CARD at check-in to  the Emergency Department and triage nurse.  Should you have questions after your visit or need to cancel or reschedule your appointment, please contact [facility]  Dept: [phone]  and follow the prompts.  Office hours are 8:00 a.m. to 4:30 p.m. Monday - Friday. Please note that voicemails left after 4:00 p.m. may not be returned until the following business day.  We are closed weekends and major holidays. You have access to a nurse at all times for urgent questions. Please call the main number to the clinic Dept: [phone] and follow the prompts.   For any non-urgent questions, you may also contact your provider using MyChart. We now offer e-Visits for anyone 43 and older to request care online for non-urgent symptoms. For details visit [email or web address].   Also download the MyChart app! Go to the app store, search "MyChart", open the app, select [facility], and log in with your MyChart username and password.  Masks are optional in the cancer centers. If you would like for your care team to wear a mask while they are taking care of you, please let them know. You may have one support person who is at least 82 years old accompany you for your appointments.

## 2022-03-12 NOTE — Telephone Encounter (Signed)
-----   Message from [name], MD sent at 03/12/2022 10:22 AM EDT ----- Please schedule echo around 9/15 Thanks

## 2022-03-12 NOTE — Telephone Encounter (Signed)
Called and left appt for Echo on 9/15 at [facility] at 10 am arrive at 0945. Ask her to call the office back for questions.

## 2022-03-13 ENCOUNTER — Other Ambulatory Visit: Payer: Self-pay

## 2022-03-13 ENCOUNTER — Other Ambulatory Visit: Payer: Self-pay | Admitting: Hematology and Oncology

## 2022-03-13 ENCOUNTER — Encounter: Payer: Self-pay | Admitting: Hematology and Oncology

## 2022-03-13 NOTE — Assessment & Plan Note (Signed)
Renal function is stable. Monitor closely She was seen by nephrologist recently

## 2022-03-13 NOTE — Assessment & Plan Note (Signed)
Her last imaging study from April 2023 showed no evidence of disease She has no signs of cancer recurrence after all these years Plan CT imaging once a year only She will continue trastuzumab indefinitely Her next echocardiogram will be due in September

## 2022-03-13 NOTE — Progress Notes (Signed)
DISCONTINUE ON PATHWAY REGIMEN - Uterine     A cycle is every 21 days:     Paclitaxel      Carboplatin   **Always confirm dose/schedule in your pharmacy ordering system**  REASON: Other Reason PRIOR TREATMENT: UTOS50: Carboplatin + Paclitaxel (6/175) q21 Days x 6 Cycles TREATMENT RESPONSE: Complete Response (CR)  START OFF PATHWAY REGIMEN - Uterine   [id number]:Trastuzumab 6 mg/kg IV D1 q21 Days:   A cycle is every 21 days:     Trastuzumab-xxxx   **Always confirm dose/schedule in your pharmacy ordering system**  Patient Characteristics: Serous Carcinoma, Recurrent/Progressive Disease, Second Line, 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 12 Months From Prior Therapy HER2 Status: Positive Intent of Therapy: Non-Curative / Palliative Intent, Discussed with Patient 

## 2022-03-13 NOTE — Assessment & Plan Note (Signed)
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 

## 2022-03-13 NOTE — Progress Notes (Unsigned)
[place] OFFICE PROGRESS NOTE  Patient Care Team: Burnard Bunting, [name]  ASSESSMENT & PLAN:  Endometrial cancer [facility]) Her last imaging study from April 2023 showed no evidence of disease She has no signs of cancer recurrence after all these years Plan CT imaging once a year only She will continue trastuzumab indefinitely Her next echocardiogram will be due in September   CKD (chronic kidney disease), stage III ([place]) Renal function is stable. Monitor closely She was seen by nephrologist recently  Anemia in neoplastic disease She has multifactorial anemia, anemia secondary to prior chemotherapy as well as chronic kidney disease Anemia is improving/stable Recent vitamin B12 level was adequate She is not symptomatic Observe only  Orders Placed This Encounter  Procedures   CBC with Differential ([place] Only)    Standing Status:   Future    Standing Expiration Date:   29/92/4268   Basic Metabolic Panel - [place] Only    Standing Status:   Future    Standing Expiration Date:   04/24/2023   CBC with Differential ([place] Only)    Standing Status:   Future    Standing Expiration Date:   34/19/6222   Basic Metabolic Panel - [place] Only    Standing Status:   Future    Standing Expiration Date:   06/05/2023   CBC with Differential ([place] Only)    Standing Status:   Future    Standing Expiration Date:   03/22/9891   Basic Metabolic Panel - [place] Only    Standing Status:   Future    Standing Expiration Date:   07/17/2023   CBC with Differential ([place] Only)    Standing Status:   Future    Standing Expiration Date:   08/02/4172   Basic Metabolic Panel - [place] Only    Standing Status:   Future    Standing Expiration Date:   08/28/2023   CBC with Differential ([place] Only)    Standing Status:   Future    Standing Expiration Date:   0/81/4481   Basic Metabolic Panel -  [place] Only    Standing Status:   Future    Standing Expiration Date:   10/09/2023   ECHOCARDIOGRAM COMPLETE    Standing Status:   Future    Standing Expiration Date:   03/13/2023    Order Specific Question:   Where should this test be performed    Answer:   [place]    Order Specific Question:   Perflutren DEFINITY (image enhancing agent) should be administered unless hypersensitivity or allergy exist    Answer:   Administer Perflutren    Order Specific Question:   Reason for exam-Echo    Answer:   Chemo  Z09    All questions were answered. The patient knows to call the clinic with any problems, questions or concerns. The total time spent in the appointment was 20 minutes encounter with patients including review of chart and various tests results, discussions about plan of care and coordination of care plan   [name], MD 03/13/2022 2:13 PM  INTERVAL HISTORY: Please see below for problem oriented charting. she returns for treatment follow-up on maintenance trastuzumab She denies new symptoms or side-effects  REVIEW OF SYSTEMS:   Constitutional: Denies fevers, chills or abnormal weight loss Eyes: Denies blurriness of vision Ears, nose, mouth, throat, and face: Denies mucositis or sore throat Respiratory: Denies cough, dyspnea or  wheezes Cardiovascular: Denies palpitation, chest discomfort or lower extremity swelling Gastrointestinal:  Denies nausea, heartburn or change in bowel habits Skin: Denies abnormal skin rashes Lymphatics: Denies new lymphadenopathy or easy bruising Neurological:Denies numbness, tingling or new weaknesses Behavioral/Psych: Mood is stable, no new changes  All other systems were reviewed with the patient and are negative.  I have reviewed the past medical history, past surgical history, social history and family history with the patient and they are unchanged from previous note.  ALLERGIES:  has No Known Allergies.  MEDICATIONS:  Current  Outpatient Medications  Medication Sig Dispense Refill   acetaminophen (TYLENOL) 500 MG tablet Take 1,000 mg by mouth every 6 (six) hours as needed.     aspirin EC 81 MG tablet Take 81 mg by mouth daily.     atorvastatin (LIPITOR) 20 MG tablet Take 20 mg by mouth every evening.      Calcium Carbonate-Vitamin D (CALCIUM-VITAMIN D) 500-200 MG-UNIT per tablet Take 1 tablet by mouth daily.     citalopram (CELEXA) 20 MG tablet Take 20 mg by mouth at bedtime.  (Patient not taking: Reported on 01/23/2022)     estradiol (ESTRACE) 0.1 MG/GM vaginal cream PLACE 1 APPLICATORFUL VAGINALLY 3 TIMES A WEEK 42.5 g 12   gabapentin (NEURONTIN) 600 MG tablet Take 600 mg by mouth at bedtime as needed. (Patient not taking: Reported on 01/23/2022)     levothyroxine (SYNTHROID, LEVOTHROID) 100 MCG tablet Take 100 mcg by mouth daily before breakfast.      losartan-hydrochlorothiazide (HYZAAR) 100-25 MG tablet  (Patient not taking: Reported on 01/23/2022)     magnesium oxide (MAG-OX) 400 (241.3 Mg) MG tablet Take 1 tablet (400 mg total) by mouth daily. 30 tablet 11   metoprolol succinate (TOPROL-XL) 25 MG 24 hr tablet Take 25 mg by mouth daily.     Multiple Vitamin (MULTIVITAMIN WITH MINERALS) TABS Take 1 tablet by mouth daily.     valACYclovir (VALTREX) 1000 MG tablet valacyclovir 1 gram tablet     No current facility-administered medications for this visit.    SUMMARY OF ONCOLOGIC HISTORY: Oncology History Overview Note  NEALY HICKMON  has a remote history of left  breast cancer at age 53 but received BRCA testing approximately 5 years ago which was negative. Her cancer was treated with surgery but no radiation or chemotherapy.   Serous endometrial cancer, MSI stable ER positive, PR neg, Her2/neu 3+      Endometrial cancer ([place])  08/29/2016 Pathology Results   Endometrium, biopsy - HIGH GRADE ENDOMETRIAL CARCINOMA, SEE COMMENT. Microscopic Comment The sections show multiple fragments of adenocarcinoma  displaying glandular and papillary patterns associated with high grade cytomorphology characterized by nuclear pleomorphism, prominent nucleoli and brisk mitosis. Immunohistochemical stains show that the tumor cells are positive for vimentin, p16, p53 with increased Ki-67 expression. Estrogen and progesterone receptor stains show patchy weak positivity. No significant positivity is seen with CEA. The findings are consistent with high grade endometrial carcinoma and the overall morphology and phenotypic features favor serous carcinoma.   08/29/2016 Initial Diagnosis   She presented with postmenopausal bleeding   10/03/2016 Imaging   CT C/A/P 09/2016 IMPRESSION: 1. Thickening of the endometrial canal up to 19 mm in fundus, presumably corresponding to the patient's reported endometrial carcinoma. 2. Multiple tiny pulmonary nodules scattered throughout the lungs bilaterally measuring 4 mm or less in size. Nodules of this size are typically considered statistically likely benign. In the setting of known primary malignancy, metastatic disease to the lungs  is not excluded, but is not strongly favored on today's examination. Attention on followup studies is recommended to ensure the stability or resolution of these nodules. 3. Subcentimeter low-attenuation lesion in the central aspect of segment 8 of the liver is too small to characterize. This is statistically likely a tiny cyst, but warrants attention on follow-up studies to exclude the possibility of a solitary hepatic metastasis. 4. 1.5 x 1.5 x 1.7 cm well-circumscribed lesion in the proximal stomach. This is of uncertain etiology and significance, and could represent a benign gastric polyp, however, further evaluation with nonemergent endoscopy is suggested in the near future for further evaluation. 5. **An incidental finding of potential clinical significance has been found. 1.1 x 1.6 cm thyroid nodule in the inferior aspect of the right lobe of the thyroid  gland. Follow-up evaluation with nonemergent thyroid ultrasound is recommended in the near future to better evaluate this finding. This recommendation follows ACR consensuss guidelines: Managing Incidental Thyroid Nodules Detected on Imaging: White Paper of the ACR Incidental Thyroid Findings Committee. J Am Coll Radiol 2015;12(2):143-150.** 6. Aortic atherosclerosis, in addition to left anterior descending coronary artery disease   10/22/2016 Surgery   Robotic assisted total hysterectomy, BSO and bilateral pelvic lymphadenectomy  Final pathology revealed a 3cm polyp containing serous carcinoma but with no myometrial invasion, no LVSI and negative nodes.  Stage IA Uterine serous cancer   10/22/2016 Pathology Results   1. Lymph nodes, regional resection, right pelvic - SIX BENIGN LYMPH NODES (0/6). 2. Lymph nodes, regional resection, left pelvic - SEVEN BENIGN LYMPH NODES (0/7). 3. Uterus +/- tubes/ovaries, neoplastic, with right ovary and fallopian tube ENDOMYOMETRIUM - SEROUS CARCINOMA ARISING WITHIN AN ENDOMETRIAL POLYP - NO MYOMETRIAL INVASION IDENTIFIED - ADENOMYOSIS - LEIOMYOMA (1 CM) - SEE ONCOLOGY TABLE AND COMMENT CERVIX - CARCINOMA FOCALLY INVOLVES ENDOCERVICAL GLANDS - NABOTHIAN CYSTS RIGHT ADNEXA - BENIGN OVARY AND FALLOPIAN TUBE - NO CARCINOMA IDENTIFIED 4. Cul-de-sac biopsy - MESOTHELIAL HYPERPLASIA Microscopic Comment 3. ONCOLOGY TABLE-UTERUS, CARCINOMA OR CARCINOSARCOMA Specimen: Uterus, right fallopian tube and ovary Procedure: Total hysterectomy and right salpingo-oophorectomy Lymph node sampling performed: Bilateral pelvic regional resection Specimen integrity: Intact Maximum tumor size: 3 cm (polyp) Histologic type: Serous carcinoma Grade: High grade Myometrial invasion: Not identified Cervical stromal involvement: No, focal endocervical gland involvement Extent of involvement of other organs: Not identified Lymph - vascular invasion: Not  identified Peritoneal washings: N/A Lymph nodes: Examined: 0 Sentinel 13 Non-sentinel 13 Total Lymph nodes with metastasis: 0 Isolated tumor cells (< 0.2 mm): 0 Micrometastasis: (> 0.2 mm and < 2.0 mm): 0 Macrometastasis: (> 2.0 mm): 0 Extracapsular extension: N/A Pelvic lymph nodes: 0 involved of 13 lymph nodes. Para-aortic lymph nodes: No para-aortic nodes submitted TNM code: pT1a, pNX FIGO Stage (based on pathologic findings, needs clinical correlation): IA Comment: Immunohistochemistry for cytokeratin AE1/AE3 is performed on all of the lymph nodes (parts 1 & 2) and no metastatic carcinoma is identified.   07/04/2017 Genetic Testing   The patient had genetic testing due to a personal history of breast and uterine cancer, and a family history of stomach cancer.  The Multi-Cancer Panel was ordered. The Multi-Cancer Panel offered by [facility] includes sequencing and/or deletion duplication testing of the following 83 genes: ALK, APC, ATM, AXIN2,BAP1,  BARD1, BLM, BMPR1A, BRCA1, BRCA2, BRIP1, CASR, CDC73, CDH1, CDK4, CDKN1B, CDKN1C, CDKN2A (p14ARF), CDKN2A (p16INK4a), CEBPA, CHEK2, CTNNA1, DICER1, DIS3L2, EGFR (c.2369C>T, p.Thr790Met variant only), EPCAM (Deletion/duplication testing only), FH, FLCN, GATA2, GPC3, GREM1 (Promoter region deletion/duplication testing only), HOXB13 (c.251G>A, p.Gly84Glu),  HRAS, KIT, MAX, MEN1, MET, MITF (c.952G>A, p.Glu318Lys variant only), MLH1, MSH2, MSH3, MSH6, MUTYH, NBN, NF1, NF2, NTHL1, PALB2, PDGFRA, PHOX2B, PMS2, POLD1, POLE, POT1, PRKAR1A, PTCH1, PTEN, RAD50, RAD51C, RAD51D, RB1, RECQL4, RET, RUNX1, SDHAF2, SDHA (sequence changes only), SDHB, SDHC, SDHD, SMAD4, SMARCA4, SMARCB1, SMARCE1, STK11, SUFU, TERC, TERT, TMEM127, TP53, TSC1, TSC2, VHL, WRN and WT1.   Results: No pathogenic variants were identified.  A variant of uncertain significance in the gene APC was identified.  c.791A>G (p.Gln264Arg).  The date of this test report is 07/04/2017.     Genetic  Testing   Patient has genetic testing done for MSI. 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    12/03/2017 Imaging   CT Chest/Abd/Pelvis to follow pulmonary nodule and gastric mass IMPRESSION: 1. Stable CT of the chest. Small pulmonary nodules are unchanged when compared with previous exam. 2. No new findings identified. 3. Subcentimeter low-attenuation lesions within the liver are remain too small to characterize but are stable from prior exam. 4. Persistent indeterminate low-attenuation structure within the proximal stomach is unchanged measuring 1.4 cm. Correlation with direct visualization is advised   06/30/2018 Imaging   CT CHEST Lungs/Pleura: Stable scattered sub-cm pulmonary nodules are again seen bilaterally and are stable compared to previous studies. No new or enlarging pulmonary nodules or masses identified. No evidence of pulmonary infiltrate or pleural effusion.   08/19/2018 Echocardiogram   [place] is done in [place]: EF 60-65%. Mild impaired relaxation. (report scanned)   09/17/2018 Relapse/Recurrence   Presented with c/o vagina discharge.  Lesion noted at the left vaginal apex 29m lesion removed Path c/w high grade serous cancer   09/17/2018 Pathology Results   Vagina, biopsy, left apex - HIGH GRADE SEROUS CARCINOMA.   09/28/2018 PET scan   1. Two hypermetabolic axial lymph nodes. Unusual site for metastatic endometrial carcinoma however the activity is more intense than typically seen in reactive adenopathy. Suggest ultrasound-guided percutaneous biopsy of the larger RIGHT axial lymph node 2. No evidence of local recurrence at the vaginal cuff.  3. No metastatic adenopathy in the abdomen or pelvis. 4. Stable small pulmonary nodules   10/09/2018 Pathology Results   Lymph node, needle/core biopsy, right axilla - METASTATIC CARCINOMA, SEE COMMENT. Microscopic Comment The carcinoma appears high grade. Immunohistochemistry is positive for  cytokeratin 7, PAX-8, and ER. Cytokeratin 20, CDX-2, PR, and GATA-3 are negative. The findings along with the history are consistent with a gynecologic primary.    10/09/2018 Procedure   Ultrasound-guided core biopsies of a right axillary lymph node.   10/14/2018 Cancer Staging   Staging form: Corpus Uteri - Carcinoma and Carcinosarcoma, AJCC 8th Edition - Clinical: Stage IVB (cT1, cN0, pM1) - Signed by GHeath Lark MD on 10/14/2018   10/19/2018 Imaging   Placement of single lumen port a cath via right internal jugular vein. The catheter tip lies at the cavo-atrial junction. A power injectable port a cath was placed and is ready for immediate use.    10/21/2018 -  Chemotherapy   The patient had carboplatin and taxol for treatment   11/11/2018 - 03/12/2022 Chemotherapy   Patient is on Treatment Plan : BREAST Trastuzumab q21d     12/21/2018 PET scan   1. Interval resolution of hypermetabolic right axillary lymph nodes. No metabolic findings highly suspicious for recurrent metastatic disease. 2. New mild hypermetabolism within a borderline prominent portacaval lymph node, nonspecific. While a reactive node is favored, a lymph node metastasis cannot be entirely excluded. Suggest attention  to this lymph node on follow-up PET-CT in 3-6 months. 3. Nonspecific new hypermetabolism at the ileocecal valve, more likely physiologic given absence of CT correlate. 4. Scattered subcentimeter pulmonary nodules are all stable and below PET resolution, more likely benign, continued CT surveillance advised. 5. Chronic findings include: Aortic Atherosclerosis (ICD10-I70.0). Marked diffuse colonic diverticulosis. Coronary atherosclerosis.     12/28/2018 Echocardiogram   1. The left ventricle has normal systolic function with an ejection fraction of 60-65%. The cavity size was normal. Left ventricular diastolic Doppler parameters are consistent with impaired relaxation.  2. GLS recorded as -10.7 but LV appears hyperdynamic  and tracking of endocardium appears poor.  3. The right ventricle has normal systolic function. The cavity was normal. There is no increase in right ventricular wall thickness.  4. Mild thickening of the mitral valve leaflet.  5. The aortic valve was not well visualized. Mild thickening of the aortic valve. Aortic valve regurgitation is trivial by color flow Doppler.   03/23/2019 Imaging   PET 1. No findings of hypermetabolic residual/recurrent or metastatic disease. 2. Similar low-level hypermetabolism within normal sized portocaval and right inguinal nodes, favored to be reactive. 3. Ongoing stability of small bilateral pulmonary nodules, favored to be benign. Below PET resolution. 4. Coronary artery atherosclerosis. Aortic Atherosclerosis   03/26/2019 Echocardiogram   1. The left ventricle has normal systolic function, with an ejection fraction of 55-60%. The cavity size was normal. Left ventricular diastolic Doppler parameters are consistent with impaired relaxation.  2. The right ventricle has normal systolic function. The cavity was normal.  3. The mitral valve is abnormal. Mild thickening of the mitral valve leaflet. There is mild mitral annular calcification present.  4. The tricuspid valve is grossly normal.  5. The aortic valve is tricuspid. Mild calcification of the aortic valve. No stenosis of the aortic valve.  6. The aorta is normal unless otherwise noted.  7. Normal LV systolic function; grade 1 diastolic dysfunction; SEG-31.5%.   06/30/2019 Imaging   Chest Impression:   1. No evidence thoracic metastasis. 2. Stable small benign-appearing pulmonary nodules   Abdomen / Pelvis Impression:   1. No evidence of metastatic disease in the abdomen pelvis. 2.  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 3.  Aortic Atherosclerosis (ICD10-I70.0).   06/30/2019 Echocardiogram    1. Left ventricular ejection fraction, by visual estimation, is 60 to 65%. The left  ventricle has normal function. There is no left ventricular hypertrophy.  2. Left ventricular diastolic parameters are consistent with Grade I diastolic dysfunction (impaired relaxation).  3. The left ventricle has no regional wall motion abnormalities.  4. Global right ventricle has normal systolic function.The right ventricular size is normal. No increase in right ventricular wall thickness.  5. Left atrial size was mild-moderately dilated.  6. Right atrial size was normal.  7. The mitral valve is normal in structure. Trivial mitral valve regurgitation.  8. The tricuspid valve is normal in structure. Tricuspid valve regurgitation is trivial.  9. The aortic valve is normal in structure. Aortic valve regurgitation is trivial. No evidence of aortic valve sclerosis or stenosis. 10. The pulmonic valve was grossly normal. Pulmonic valve regurgitation is not visualized. 11. Mildly elevated pulmonary artery systolic pressure. 12. The inferior vena cava is normal in size with greater than 50% respiratory variability, suggesting right atrial pressure of 3 mmHg. 13. The average left ventricular global longitudinal strain is -12.5 %. GLS underestimated due to poor endocardial tracking.     09/27/2019 Imaging  1. Multiple small, nonspecific pulmonary nodules are again noted. The previously noted lung nodules are unchanged in size from previous exam. There is a new lung nodule within the medial right lower lobe which is nonspecific measure 4 mm. Attention at follow-up imaging is recommended. 2. No evidence of metastatic disease within the abdomen or pelvis. 3. Coronary artery calcifications.  The 4.  Aortic Atherosclerosis (ICD10-I70.0).   09/30/2019 Echocardiogram    1. Left ventricular ejection fraction, by estimation, is 60 to 65%. The left ventricle has normal function. The left ventricle has no regional wall motion abnormalities. Left ventricular diastolic parameters are consistent with Grade I  diastolic dysfunction (impaired relaxation).  2. Right ventricular systolic function is normal. The right ventricular size is normal. There is normal pulmonary artery systolic pressure. The estimated right ventricular systolic pressure is 98.2 mmHg.  3. The mitral valve is normal in structure. No evidence of mitral valve regurgitation. No evidence of mitral stenosis.  4. The aortic valve is normal in structure. Aortic valve regurgitation is trivial. No aortic stenosis is present.  5. The inferior vena cava is normal in size with greater than 50% respiratory variability, suggesting right atrial pressure of 3 mmHg.     01/07/2020 Echocardiogram    1. Normal LV function; grade 1 diastolic dysfunction; mild AI; GLS -21%.  2. Left ventricular ejection fraction, by estimation, is 55 to 60%. The left ventricle has normal function. The left ventricle has no regional wall motion abnormalities. Left ventricular diastolic parameters are consistent with Grade I diastolic dysfunction (impaired relaxation).  3. Right ventricular systolic function is normal. The right ventricular size is normal.  4. The mitral valve is normal in structure. Trivial mitral valve regurgitation. No evidence of mitral stenosis.  5. The aortic valve is tricuspid. Aortic valve regurgitation is mild. Mild aortic valve sclerosis is present, with no evidence of aortic valve stenosis.  6. The inferior vena cava is normal in size with greater than 50% respiratory variability, suggesting right atrial pressure of 3 mmHg.     03/27/2020 Imaging   1. Status post hysterectomy. No evidence of recurrent or metastatic disease in the abdomen or pelvis. 2. Multiple small pulmonary nodules in the lung bases are unchanged to the extent that they are included on current examination and remain most likely sequelae of prior infection or inflammation. Attention on follow-up. 3. Aortic Atherosclerosis (ICD10-I70.0).   04/17/2020 Echocardiogram   1. Normal  LVEF, normal and unchanged GLS: -20.2%.  2. Left ventricular ejection fraction, by estimation, is 60 to 65%. The left ventricle has normal function. The left ventricle has no regional wall motion abnormalities. There is mild concentric left ventricular hypertrophy. Left ventricular diastolic parameters are consistent with Grade I diastolic dysfunction (impaired relaxation). The average left ventricular global longitudinal strain is -20.2 %. The global longitudinal strain is normal.  3. Right ventricular systolic function is normal. The right ventricular size is normal.  4. The mitral valve is normal in structure. Mild mitral valve regurgitation. No evidence of mitral stenosis.  5. The aortic valve is normal in structure. There is mild calcification of the aortic valve. There is mild thickening of the aortic valve. Aortic valve regurgitation is mild. No aortic stenosis is present.  6. The inferior vena cava is normal in size with greater than 50% respiratory variability, suggesting right atrial pressure of 3 mmHg.   08/02/2020 Echocardiogram   1. Left ventricular ejection fraction, by estimation, is 55 to 60%. The left ventricle has normal function.  The left ventricle has no regional wall motion abnormalities. Left ventricular diastolic parameters are consistent with Grade I diastolic dysfunction (impaired relaxation). The average left ventricular global longitudinal strain is -27.0 %. The global longitudinal strain is normal.  2. Right ventricular systolic function is normal. The right ventricular size is normal. Tricuspid regurgitation signal is inadequate for assessing PA pressure.  3. The mitral valve is grossly normal. Trivial mitral valve regurgitation. No evidence of mitral stenosis.  4. The aortic valve is tricuspid. Aortic valve regurgitation is mild. No aortic stenosis is present.  5. The inferior vena cava is normal in size with greater than 50% respiratory variability, suggesting right atrial  pressure of 3 mmHg.   09/19/2020 Imaging   1. Status post hysterectomy. No evidence of recurrent or new metastatic disease within the chest, abdomen, or pelvis. 2. Scattered bilateral tiny pulmonary nodules, unchanged from prior studies and most likely sequela of prior infection or inflammation, recommend attention on follow-up imaging. No new suspicious pulmonary nodules or masses. 3. Subcutaneous edema and small hematoma overlying the posterior gluteal musculature, recommend correlation with recent history of trauma. 4. Mild low-density wall thickening of the gastric antrum, which may represent gastritis. 5. Hepatic steatosis. 6. Extensive sigmoid colonic diverticulosis without findings of acute diverticulitis. 7. Aortic atherosclerosis.  Aortic Atherosclerosis (ICD10-I70.0).   11/20/2020 Echocardiogram    1. Compared to echo from Jan 2022, Global longitudinal strain is less negative. (Previously -27%). Left ventricular ejection fraction, by estimation, is 60 to 65%. The left ventricle has normal function. The left ventricle has no regional wall motion abnormalities. There is mild left ventricular hypertrophy. Left ventricular diastolic parameters are indeterminate.  2. Right ventricular systolic function is normal. The right ventricular size is normal.  3. The mitral valve is normal in structure. Trivial mitral valve regurgitation.  4. Aortic valve regurgitation is mild. Mild aortic valve sclerosis is present, with no evidence of aortic valve stenosis.     03/12/2021 Echocardiogram   1. Left ventricular ejection fraction, by estimation, is 60 to 65%. Left ventricular ejection fraction by 2D MOD biplane is 63.6 %. The left ventricle has normal function. The left ventricle has no regional wall motion abnormalities. There is mild left ventricular hypertrophy. Left ventricular diastolic parameters are consistent with Grade I diastolic dysfunction (impaired relaxation). The average left ventricular  global longitudinal strain is -20.0 %. The global longitudinal strain is normal.  2. Right ventricular systolic function is normal. The right ventricular size is normal. There is normal pulmonary artery systolic pressure. The estimated right ventricular systolic pressure is 16.3 mmHg.  3. The mitral valve is normal in structure. No evidence of mitral valve regurgitation. No evidence of mitral stenosis.  4. The aortic valve is tricuspid. Aortic valve regurgitation is trivial. No aortic stenosis is present. Aortic regurgitation PHT measures 403 msec.  5. The inferior vena cava is normal in size with greater than 50% respiratory variability, suggesting right atrial pressure of 3 mmHg.   04/02/2021 Imaging   Stable small pulmonary nodules scattered throughout the chest, unchanged, without new or suspicious pulmonary nodule.   Three-vessel coronary artery disease.   Aortic Atherosclerosis (ICD10-I70.0).   06/06/2021 Imaging    1. Left ventricular ejection fraction, by estimation, is 60 to 65%. The left ventricle has normal function. The left ventricle has no regional wall motion abnormalities. Left ventricular diastolic parameters are consistent with Grade I diastolic dysfunction (impaired relaxation).  2. Right ventricular systolic function is normal. The right ventricular size is normal.  There is normal pulmonary artery systolic pressure. The estimated right ventricular systolic pressure is 99.3 mmHg.  3. Left atrial size was mildly dilated.  4. The mitral valve is normal in structure. Trivial mitral valve regurgitation.  5. The aortic valve is tricuspid. Aortic valve regurgitation is mild. No aortic stenosis is present.  6. The inferior vena cava is normal in size with greater than 50% respiratory variability, suggesting right atrial pressure of 3 mmHg.  7. GLS attempted but not reported due to suboptimal tracking.     09/25/2021 Echocardiogram    1. Left ventricular ejection fraction, by  estimation, is 60 to 65%. Left ventricular ejection fraction by 3D volume is 56 %. The left ventricle has normal function. The left ventricle has no regional wall motion abnormalities. Left ventricular diastolic parameters were normal.  2. Right ventricular systolic function is normal. The right ventricular size is normal. There is normal pulmonary artery systolic pressure.  3. The mitral valve is normal in structure. Trivial mitral valve regurgitation. No evidence of mitral stenosis.  4. The aortic valve is tricuspid. Aortic valve regurgitation is mild. No aortic stenosis is present.  5. The inferior vena cava is normal in size with greater than 50% respiratory variability, suggesting right atrial pressure of 3 mmHg.   10/15/2021 Imaging   1. Stable examination post prior hysterectomy without evidence of local recurrence or metastatic disease within the chest, abdomen, or pelvis. 2. Unchanged small bilateral pulmonary nodules, likely benign. 3. Left-sided colonic diverticulosis without findings of acute diverticulitis. 4. Aortic Atherosclerosis (ICD10-I70.0).       12/31/2021 Echocardiogram    1. Left ventricular ejection fraction, by estimation, is 65 to 70%. The left ventricle has normal function. The left ventricle has no regional wall motion abnormalities. Left ventricular diastolic parameters are consistent with Grade I diastolic dysfunction (impaired relaxation).  2. Right ventricular systolic function is normal. The right ventricular size is normal. There is normal pulmonary artery systolic pressure. The estimated right ventricular systolic pressure is 57.0 mmHg.  3. The mitral valve is grossly normal. Trivial mitral valve regurgitation. No evidence of mitral stenosis.  4. The aortic valve is tricuspid. Aortic valve regurgitation is mild. No aortic stenosis is present.  5. The inferior vena cava is normal in size with greater than 50% respiratory variability, suggesting right atrial pressure  of 3 mmHg.     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   Metastasis to lymph nodes ([place])   10/21/2018 - 02/24/2019 Chemotherapy   The patient had palonosetron (ALOXI) injection 0.25 mg, 0.25 mg, Intravenous,  Once, 7 of 7 cycles Administration: 0.25 mg (10/21/2018), 0.25 mg (11/11/2018), 0.25 mg (12/02/2018), 0.25 mg (12/23/2018), 0.25 mg (01/13/2019), 0.25 mg (02/03/2019), 0.25 mg (02/24/2019) CARBOplatin (PARAPLATIN) 310 mg in sodium chloride 0.9 % 250 mL chemo infusion, 310 mg, Intravenous,  Once, 7 of 7 cycles Dose modification: 300 mg (original dose 311.5 mg, Cycle 4, Reason: Dose not tolerated) Administration: 310 mg (10/21/2018), 300 mg (11/11/2018), 300 mg (12/02/2018), 300 mg (12/23/2018), 300 mg (01/13/2019), 300 mg (02/03/2019), 300 mg (02/24/2019) PACLitaxel (TAXOL) 222 mg in sodium chloride 0.9 % 250 mL chemo infusion (> 65m/m2), 135 mg/m2 = 222 mg, Intravenous,  Once, 7 of 7 cycles Administration: 222 mg (10/21/2018), 222 mg (11/11/2018), 222 mg (12/02/2018), 222 mg (12/23/2018), 222 mg (  01/13/2019), 222 mg (02/03/2019), 222 mg (02/24/2019) fosaprepitant (EMEND) 150 mg, dexamethasone (DECADRON) 12 mg in sodium chloride 0.9 % 145 mL IVPB, , Intravenous,  Once, 7 of 7 cycles Administration:  (10/21/2018),  (11/11/2018),  (12/02/2018),  (12/23/2018),  (01/13/2019),  (02/03/2019),  (02/24/2019)  for chemotherapy treatment.    04/02/2022 -  Chemotherapy   Patient is on Treatment Plan : BREAST MAINTENANCE Trastuzumab IV (6) or SQ (600) D1 q21d X 11 Cycles       PHYSICAL EXAMINATION: ECOG PERFORMANCE STATUS: 0 - Asymptomatic  Vitals:   03/12/22 1016  BP: 129/69  Pulse: 69  Resp: 18  SpO2: 97%   Filed Weights   03/12/22 1016  Weight: 133 lb 3.2 oz (60.4 kg)    GENERAL:alert, no distress and comfortable NEURO: alert & oriented x 3 with fluent speech, no  focal motor/sensory deficits  LABORATORY DATA:  I have reviewed the data as listed    Component Value Date/Time   NA 136 03/12/2022 1000   NA 139 02/13/2017 1512   K 3.5 03/12/2022 1000   K 4.2 02/13/2017 1512   CL 100 03/12/2022 1000   CO2 27 03/12/2022 1000   CO2 28 02/13/2017 1512   GLUCOSE 106 (H) 03/12/2022 1000   GLUCOSE 118 02/13/2017 1512   BUN 29 (H) 03/12/2022 1000   BUN 25.2 02/13/2017 1512   CREATININE 1.28 (H) 03/12/2022 1000   CREATININE 1.37 (H) 12/21/2019 0850   CREATININE 1.2 (H) 02/13/2017 1512   CALCIUM 9.5 03/12/2022 1000   CALCIUM 10.0 02/13/2017 1512   PROT 6.8 03/12/2022 1000   ALBUMIN 4.4 03/12/2022 1000   AST 23 03/12/2022 1000   AST 19 12/21/2019 0850   ALT 13 03/12/2022 1000   ALT 13 12/21/2019 0850   ALKPHOS 51 03/12/2022 1000   BILITOT 0.4 03/12/2022 1000   BILITOT 0.4 12/21/2019 0850   GFRNONAA 42 (L) 03/12/2022 1000   GFRNONAA 37 (L) 12/21/2019 0850   GFRAA 47 (L) 03/28/2020 0933   GFRAA 42 (L) 12/21/2019 0850    No results found for: "SPEP", "UPEP"  Lab Results  Component Value Date   WBC 6.2 03/12/2022   NEUTROABS 3.8 03/12/2022   HGB 10.9 (L) 03/12/2022   HCT 30.0 (L) 03/12/2022   MCV 87.5 03/12/2022   PLT 218 03/12/2022      Chemistry      Component Value Date/Time   NA 136 03/12/2022 1000   NA 139 02/13/2017 1512   K 3.5 03/12/2022 1000   K 4.2 02/13/2017 1512   CL 100 03/12/2022 1000   CO2 27 03/12/2022 1000   CO2 28 02/13/2017 1512   BUN 29 (H) 03/12/2022 1000   BUN 25.2 02/13/2017 1512   CREATININE 1.28 (H) 03/12/2022 1000   CREATININE 1.37 (H) 12/21/2019 0850   CREATININE 1.2 (H) 02/13/2017 1512      Component Value Date/Time   CALCIUM 9.5 03/12/2022 1000   CALCIUM 10.0 02/13/2017 1512   ALKPHOS 51 03/12/2022 1000   AST 23 03/12/2022 1000   AST 19 12/21/2019 0850   ALT 13 03/12/2022 1000   ALT 13 12/21/2019 0850   BILITOT 0.4 03/12/2022 1000   BILITOT 0.4 12/21/2019 0850

## 2022-03-14 ENCOUNTER — Encounter: Payer: Self-pay | Admitting: Hematology and Oncology

## 2022-03-14 ENCOUNTER — Other Ambulatory Visit: Payer: Self-pay

## 2022-03-20 ENCOUNTER — Ambulatory Visit: Payer: Medicare HMO | Admitting: Physical Therapy

## 2022-03-21 DIAGNOSIS — C4442 Squamous cell carcinoma of skin of scalp and neck: Secondary | ICD-10-CM | POA: Diagnosis not present

## 2022-03-26 DIAGNOSIS — C541 Malignant neoplasm of endometrium: Secondary | ICD-10-CM | POA: Diagnosis not present

## 2022-03-26 DIAGNOSIS — R2689 Other abnormalities of gait and mobility: Secondary | ICD-10-CM | POA: Diagnosis not present

## 2022-03-26 DIAGNOSIS — I5189 Other ill-defined heart diseases: Secondary | ICD-10-CM | POA: Diagnosis not present

## 2022-03-26 DIAGNOSIS — M79642 Pain in left hand: Secondary | ICD-10-CM | POA: Diagnosis not present

## 2022-03-26 DIAGNOSIS — I1310 Hypertensive heart and chronic kidney disease without heart failure, with stage 1 through stage 4 chronic kidney disease, or unspecified chronic kidney disease: Secondary | ICD-10-CM | POA: Diagnosis not present

## 2022-03-26 DIAGNOSIS — R42 Dizziness and giddiness: Secondary | ICD-10-CM | POA: Diagnosis not present

## 2022-03-26 DIAGNOSIS — S62317A Displaced fracture of base of fifth metacarpal bone. left hand, initial encounter for closed fracture: Secondary | ICD-10-CM | POA: Diagnosis not present

## 2022-03-26 DIAGNOSIS — W108XXA Fall (on) (from) other stairs and steps, initial encounter: Secondary | ICD-10-CM | POA: Diagnosis not present

## 2022-03-26 DIAGNOSIS — N1832 Chronic kidney disease, stage 3b: Secondary | ICD-10-CM | POA: Diagnosis not present

## 2022-03-27 ENCOUNTER — Ambulatory Visit: Payer: Medicare HMO | Admitting: Physical Therapy

## 2022-03-29 ENCOUNTER — Other Ambulatory Visit (HOSPITAL_COMMUNITY): Payer: Medicare HMO

## 2022-04-02 ENCOUNTER — Inpatient Hospital Stay: Payer: Medicare HMO | Attending: Gynecologic Oncology

## 2022-04-02 ENCOUNTER — Other Ambulatory Visit: Payer: Self-pay

## 2022-04-02 VITALS — BP 165/85 | HR 70 | Temp 98.0°F | Resp 17 | Wt 132.5 lb

## 2022-04-02 DIAGNOSIS — Z5112 Encounter for antineoplastic immunotherapy: Secondary | ICD-10-CM | POA: Insufficient documentation

## 2022-04-02 DIAGNOSIS — C773 Secondary and unspecified malignant neoplasm of axilla and upper limb lymph nodes: Secondary | ICD-10-CM | POA: Insufficient documentation

## 2022-04-02 DIAGNOSIS — C541 Malignant neoplasm of endometrium: Secondary | ICD-10-CM | POA: Insufficient documentation

## 2022-04-02 DIAGNOSIS — N183 Chronic kidney disease, stage 3 unspecified: Secondary | ICD-10-CM | POA: Insufficient documentation

## 2022-04-02 DIAGNOSIS — Z7982 Long term (current) use of aspirin: Secondary | ICD-10-CM | POA: Insufficient documentation

## 2022-04-02 DIAGNOSIS — I10 Essential (primary) hypertension: Secondary | ICD-10-CM | POA: Insufficient documentation

## 2022-04-02 DIAGNOSIS — Z7189 Other specified counseling: Secondary | ICD-10-CM

## 2022-04-02 DIAGNOSIS — Z79899 Other long term (current) drug therapy: Secondary | ICD-10-CM | POA: Insufficient documentation

## 2022-04-02 MED ORDER — ACETAMINOPHEN 325 MG PO TABS
650.0000 mg | ORAL_TABLET | Freq: Once | ORAL | Status: AC
  Administered 2022-04-02: 650 mg via ORAL
  Filled 2022-04-02: qty 2

## 2022-04-02 MED ORDER — SODIUM CHLORIDE 0.9% FLUSH
10.0000 mL | INTRAVENOUS | Status: DC | PRN
  Administered 2022-04-02: 10 mL

## 2022-04-02 MED ORDER — TRASTUZUMAB-ANNS CHEMO 150 MG IV SOLR
6.0000 mg/kg | Freq: Once | INTRAVENOUS | Status: AC
  Administered 2022-04-02: 357 mg via INTRAVENOUS
  Filled 2022-04-02: qty 17

## 2022-04-02 MED ORDER — HEPARIN SOD (PORK) LOCK FLUSH 100 UNIT/ML IV SOLN
500.0000 [IU] | Freq: Once | INTRAVENOUS | Status: AC | PRN
  Administered 2022-04-02: 500 [IU]

## 2022-04-02 MED ORDER — DIPHENHYDRAMINE HCL 25 MG PO CAPS
25.0000 mg | ORAL_CAPSULE | Freq: Once | ORAL | Status: AC
  Administered 2022-04-02: 25 mg via ORAL
  Filled 2022-04-02: qty 1

## 2022-04-02 MED ORDER — SODIUM CHLORIDE 0.9 % IV SOLN: Freq: Once | INTRAVENOUS | Status: AC

## 2022-04-02 NOTE — Patient Instructions (Signed)
[facility]  Discharge Instructions: Thank you for choosing [facility] to provide your oncology and hematology care.   If you have a lab appointment with the Cancer Center, please go directly to the Cancer Center and check in at the registration area.   Wear comfortable clothing and clothing appropriate for easy access to any Portacath or PICC line.   We strive to give you quality time with your provider. You may need to reschedule your appointment if you arrive late (15 or more minutes).  Arriving late affects you and other patients whose appointments are after yours.  Also, if you miss three or more appointments without notifying the office, you may be dismissed from the clinic at the provider's discretion.      For prescription refill requests, have your pharmacy contact our office and allow 72 hours for refills to be completed.    Today you received the following chemotherapy and/or immunotherapy agents: Trastuzumab.       To help prevent nausea and vomiting after your treatment, we encourage you to take your nausea medication as directed.  BELOW ARE SYMPTOMS THAT SHOULD BE REPORTED IMMEDIATELY: *FEVER GREATER THAN 100.4 F (38 C) OR HIGHER *CHILLS OR SWEATING *NAUSEA AND VOMITING THAT IS NOT CONTROLLED WITH YOUR NAUSEA MEDICATION *UNUSUAL SHORTNESS OF BREATH *UNUSUAL BRUISING OR BLEEDING *URINARY PROBLEMS (pain or burning when urinating, or frequent urination) *BOWEL PROBLEMS (unusual diarrhea, constipation, pain near the anus) TENDERNESS IN MOUTH AND THROAT WITH OR WITHOUT PRESENCE OF ULCERS (sore throat, sores in mouth, or a toothache) UNUSUAL RASH, SWELLING OR PAIN  UNUSUAL VAGINAL DISCHARGE OR ITCHING   Items with * indicate a potential emergency and should be followed up as soon as possible or go to the Emergency Department if any problems should occur.  Please show the CHEMOTHERAPY ALERT CARD or IMMUNOTHERAPY ALERT CARD at check-in  to the Emergency Department and triage nurse.  Should you have questions after your visit or need to cancel or reschedule your appointment, please contact [facility]  Dept: [phone]  and follow the prompts.  Office hours are 8:00 a.m. to 4:30 p.m. Monday - Friday. Please note that voicemails left after 4:00 p.m. may not be returned until the following business day.  We are closed weekends and major holidays. You have access to a nurse at all times for urgent questions. Please call the main number to the clinic Dept: [phone] and follow the prompts.   For any non-urgent questions, you may also contact your provider using MyChart. We now offer e-Visits for anyone 18 and older to request care online for non-urgent symptoms. For details visit [email or web address].   Also download the MyChart app! Go to the app store, search "MyChart", open the app, select [facility], and log in with your MyChart username and password.  Masks are optional in the cancer centers. If you would like for your care team to wear a mask while they are taking care of you, please let them know. You may have one support person who is at least 82 years old accompany you for your appointments. 

## 2022-04-04 ENCOUNTER — Ambulatory Visit (HOSPITAL_COMMUNITY)
Admission: RE | Admit: 2022-04-04 | Discharge: 2022-04-04 | Disposition: A | Discharge status: Home / Self Care | Payer: Medicare HMO | Source: Ambulatory Visit | Attending: Hematology and Oncology | Admitting: Hematology and Oncology

## 2022-04-04 DIAGNOSIS — C541 Malignant neoplasm of endometrium: Secondary | ICD-10-CM | POA: Diagnosis not present

## 2022-04-04 DIAGNOSIS — I10 Essential (primary) hypertension: Secondary | ICD-10-CM | POA: Insufficient documentation

## 2022-04-04 DIAGNOSIS — Z0189 Encounter for other specified special examinations: Secondary | ICD-10-CM

## 2022-04-04 DIAGNOSIS — I351 Nonrheumatic aortic (valve) insufficiency: Secondary | ICD-10-CM | POA: Diagnosis not present

## 2022-04-04 LAB — ECHOCARDIOGRAM COMPLETE
AR max vel: 1.56 cm2
AV Area VTI: 1.7 cm2
AV Area mean vel: 1.53 cm2
AV Mean grad: 3 mmHg
AV Peak grad: 5.3 mmHg
Ao pk vel: 1.15 m/s
Area-P 1/2: 4.06 cm2
Calc EF: 50.1 %
P 1/2 time: 613 msec
S' Lateral: 2.6 cm
Single Plane A2C EF: 45.2 %
Single Plane A4C EF: 55.4 %

## 2022-04-23 ENCOUNTER — Telehealth: Payer: Self-pay

## 2022-04-23 ENCOUNTER — Inpatient Hospital Stay: Payer: Medicare HMO | Attending: Gynecologic Oncology | Admitting: Hematology and Oncology

## 2022-04-23 ENCOUNTER — Inpatient Hospital Stay: Payer: Medicare HMO

## 2022-04-23 ENCOUNTER — Encounter: Payer: Self-pay | Admitting: Hematology and Oncology

## 2022-04-23 VITALS — BP 148/60 | HR 63 | Resp 18

## 2022-04-23 VITALS — BP 146/63 | HR 66 | Resp 18 | Ht 62.0 in | Wt 133.8 lb

## 2022-04-23 DIAGNOSIS — Z7189 Other specified counseling: Secondary | ICD-10-CM

## 2022-04-23 DIAGNOSIS — N183 Chronic kidney disease, stage 3 unspecified: Secondary | ICD-10-CM | POA: Diagnosis not present

## 2022-04-23 DIAGNOSIS — Z7982 Long term (current) use of aspirin: Secondary | ICD-10-CM | POA: Diagnosis not present

## 2022-04-23 DIAGNOSIS — M65331 Trigger finger, right middle finger: Secondary | ICD-10-CM | POA: Diagnosis not present

## 2022-04-23 DIAGNOSIS — Z90722 Acquired absence of ovaries, bilateral: Secondary | ICD-10-CM | POA: Insufficient documentation

## 2022-04-23 DIAGNOSIS — D539 Nutritional anemia, unspecified: Secondary | ICD-10-CM | POA: Diagnosis not present

## 2022-04-23 DIAGNOSIS — Z5112 Encounter for antineoplastic immunotherapy: Secondary | ICD-10-CM | POA: Diagnosis present

## 2022-04-23 DIAGNOSIS — Z9079 Acquired absence of other genital organ(s): Secondary | ICD-10-CM | POA: Diagnosis not present

## 2022-04-23 DIAGNOSIS — S62317A Displaced fracture of base of fifth metacarpal bone. left hand, initial encounter for closed fracture: Secondary | ICD-10-CM | POA: Diagnosis not present

## 2022-04-23 DIAGNOSIS — C541 Malignant neoplasm of endometrium: Secondary | ICD-10-CM | POA: Insufficient documentation

## 2022-04-23 DIAGNOSIS — M79642 Pain in left hand: Secondary | ICD-10-CM | POA: Diagnosis not present

## 2022-04-23 DIAGNOSIS — Z90710 Acquired absence of both cervix and uterus: Secondary | ICD-10-CM | POA: Insufficient documentation

## 2022-04-23 DIAGNOSIS — C773 Secondary and unspecified malignant neoplasm of axilla and upper limb lymph nodes: Secondary | ICD-10-CM

## 2022-04-23 DIAGNOSIS — Z79899 Other long term (current) drug therapy: Secondary | ICD-10-CM | POA: Diagnosis not present

## 2022-04-23 LAB — BASIC METABOLIC PANEL - CANCER CENTER ONLY
Anion gap: 8 (ref 5–15)
BUN: 35 mg/dL — ABNORMAL HIGH (ref 8–23)
CO2: 28 mmol/L (ref 22–32)
Calcium: 9.3 mg/dL (ref 8.9–10.3)
Chloride: 101 mmol/L (ref 98–111)
Creatinine: 1.5 mg/dL — ABNORMAL HIGH (ref 0.44–1.00)
GFR, Estimated: 35 mL/min — ABNORMAL LOW (ref >60)
Glucose, Bld: 102 mg/dL — ABNORMAL HIGH (ref 70–99)
Potassium: 3.5 mmol/L (ref 3.5–5.1)
Sodium: 137 mmol/L (ref 135–145)

## 2022-04-23 LAB — CBC WITH DIFFERENTIAL (CANCER CENTER ONLY)
Abs Immature Granulocytes: 0.02 10*3/uL (ref 0.00–0.07)
Basophils Absolute: 0.1 10*3/uL (ref 0.0–0.1)
Basophils Relative: 1 %
Eosinophils Absolute: 0.2 10*3/uL (ref 0.0–0.5)
Eosinophils Relative: 4 %
HCT: 30.6 % — ABNORMAL LOW (ref 36.0–46.0)
Hemoglobin: 10.9 g/dL — ABNORMAL LOW (ref 12.0–15.0)
Immature Granulocytes: 0 %
Lymphocytes Relative: 26 %
Lymphs Abs: 1.7 10*3/uL (ref 0.7–4.0)
MCH: 31.6 pg (ref 26.0–34.0)
MCHC: 35.6 g/dL (ref 30.0–36.0)
MCV: 88.7 fL (ref 80.0–100.0)
Monocytes Absolute: 0.6 10*3/uL (ref 0.1–1.0)
Monocytes Relative: 10 %
Neutro Abs: 3.9 10*3/uL (ref 1.7–7.7)
Neutrophils Relative %: 59 %
Platelet Count: 229 10*3/uL (ref 150–400)
RBC: 3.45 MIL/uL — ABNORMAL LOW (ref 3.87–5.11)
RDW: 12.1 % (ref 11.5–15.5)
WBC Count: 6.5 10*3/uL (ref 4.0–10.5)
nRBC: 0 % (ref 0.0–0.2)

## 2022-04-23 MED ORDER — HEPARIN SOD (PORK) LOCK FLUSH 100 UNIT/ML IV SOLN
500.0000 [IU] | Freq: Once | INTRAVENOUS | Status: AC | PRN
  Administered 2022-04-23: 500 [IU]

## 2022-04-23 MED ORDER — SODIUM CHLORIDE 0.9% FLUSH
10.0000 mL | Freq: Once | INTRAVENOUS | Status: AC
  Administered 2022-04-23: 10 mL

## 2022-04-23 MED ORDER — TRASTUZUMAB-ANNS CHEMO 150 MG IV SOLR
6.0000 mg/kg | Freq: Once | INTRAVENOUS | Status: AC
  Administered 2022-04-23: 357 mg via INTRAVENOUS
  Filled 2022-04-23: qty 17

## 2022-04-23 MED ORDER — DIPHENHYDRAMINE HCL 25 MG PO CAPS
25.0000 mg | ORAL_CAPSULE | Freq: Once | ORAL | Status: AC
  Administered 2022-04-23: 25 mg via ORAL
  Filled 2022-04-23: qty 1

## 2022-04-23 MED ORDER — SODIUM CHLORIDE 0.9 % IV SOLN: Freq: Once | INTRAVENOUS | Status: AC

## 2022-04-23 MED ORDER — ACETAMINOPHEN 325 MG PO TABS
650.0000 mg | ORAL_TABLET | Freq: Once | ORAL | Status: AC
  Administered 2022-04-23: 650 mg via ORAL
  Filled 2022-04-23: qty 2

## 2022-04-23 MED ORDER — SODIUM CHLORIDE 0.9% FLUSH
10.0000 mL | INTRAVENOUS | Status: DC | PRN
  Administered 2022-04-23: 10 mL

## 2022-04-23 NOTE — Assessment & Plan Note (Signed)
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  

## 2022-04-23 NOTE — Patient Instructions (Signed)
[facility]   Discharge Instructions: Thank you for choosing [facility] to provide your oncology and hematology care.   If you have a lab appointment with the [facility], please go directly to the [facility] and check in at the registration area.   Wear comfortable clothing and clothing appropriate for easy access to any Portacath or PICC line.   We strive to give you quality time with your provider. You may need to reschedule your appointment if you arrive late (15 or more minutes).  Arriving late affects you and other patients whose appointments are after yours.  Also, if you miss three or more appointments without notifying the office, you may be dismissed from the clinic at the provider's discretion.      For prescription refill requests, have your pharmacy contact our office and allow 72 hours for refills to be completed.    Today you received the following chemotherapy and/or immunotherapy agents: Trastuzumab ([name])      To help prevent nausea and vomiting after your treatment, we encourage you to take your nausea medication as directed.  BELOW ARE SYMPTOMS THAT SHOULD BE REPORTED IMMEDIATELY: *FEVER GREATER THAN 100.4 F (38 C) OR HIGHER *CHILLS OR SWEATING *NAUSEA AND VOMITING THAT IS NOT CONTROLLED WITH YOUR NAUSEA MEDICATION *UNUSUAL SHORTNESS OF BREATH *UNUSUAL BRUISING OR BLEEDING *URINARY PROBLEMS (pain or burning when urinating, or frequent urination) *BOWEL PROBLEMS (unusual diarrhea, constipation, pain near the anus) TENDERNESS IN MOUTH AND THROAT WITH OR WITHOUT PRESENCE OF ULCERS (sore throat, sores in mouth, or a toothache) UNUSUAL RASH, SWELLING OR PAIN  UNUSUAL VAGINAL DISCHARGE OR ITCHING   Items with * indicate a potential emergency and should be followed up as soon as possible or go to the Emergency Department if any problems should occur.  Please show the CHEMOTHERAPY ALERT CARD or IMMUNOTHERAPY ALERT CARD  at check-in to the Emergency Department and triage nurse.  Should you have questions after your visit or need to cancel or reschedule your appointment, please contact [facility]  Dept: [phone]  and follow the prompts.  Office hours are 8:00 a.m. to 4:30 p.m. Monday - Friday. Please note that voicemails left after 4:00 p.m. may not be returned until the following business day.  We are closed weekends and major holidays. You have access to a nurse at all times for urgent questions. Please call the main number to the clinic Dept: [phone] and follow the prompts.   For any non-urgent questions, you may also contact your provider using MyChart. We now offer e-Visits for anyone 9 and older to request care online for non-urgent symptoms. For details visit [email or web address].   Also download the MyChart app! Go to the app store, search "MyChart", open the app, select [facility], and log in with your MyChart username and password.  Masks are optional in the cancer centers. If you would like for your care team to wear a mask while they are taking care of you, please let them know. You may have one support person who is at least 82 years old accompany you for your appointments.

## 2022-04-23 NOTE — Assessment & Plan Note (Signed)
Renal function is stable. Monitor closely 

## 2022-04-23 NOTE — Assessment & Plan Note (Signed)
She has multifactorial anemia, likely due to chronic kidney disease Anemia is improving/stable Recent vitamin B12 level was adequate She is not symptomatic Observe only

## 2022-04-23 NOTE — Progress Notes (Signed)
[facility] OFFICE PROGRESS NOTE  Patient Care Team: Burnard Bunting, [name]  ASSESSMENT & PLAN:  Endometrial cancer [facility] Her last imaging study from April 2023 showed no evidence of disease She has no signs of cancer recurrence after all these years Plan CT imaging once a year only She will continue trastuzumab indefinitely Her next echocardiogram will be due in December; her recent echocardiogram in September was within normal limits   Deficiency anemia She has multifactorial anemia, likely due to chronic kidney disease Anemia is improving/stable Recent vitamin B12 level was adequate She is not symptomatic Observe only  CKD (chronic kidney disease), stage III ([place]) Renal function is stable. Monitor closely  No orders of the defined types were placed in this encounter.   All questions were answered. The patient knows to call the clinic with any problems, questions or concerns. The total time spent in the appointment was 20 minutes encounter with patients including review of chart and various tests results, discussions about plan of care and coordination of care plan   [name], MD 04/23/2022 10:52 AM  INTERVAL HISTORY: Please see below for problem oriented charting. she returns for treatment follow-up on maintenance trastuzumab She has no new symptoms She is physically active No recent clinical signs or symptoms of congestive heart failure  REVIEW OF SYSTEMS:   Constitutional: Denies fevers, chills or abnormal weight loss Eyes: Denies blurriness of vision Ears, nose, mouth, throat, and face: Denies mucositis or sore throat Respiratory: Denies cough, dyspnea or wheezes Cardiovascular: Denies palpitation, chest discomfort or lower extremity swelling Gastrointestinal:  Denies nausea, heartburn or change in bowel habits Skin: Denies abnormal skin rashes Lymphatics: Denies new lymphadenopathy or easy  bruising Neurological:Denies numbness, tingling or new weaknesses Behavioral/Psych: Mood is stable, no new changes  All other systems were reviewed with the patient and are negative.  I have reviewed the past medical history, past surgical history, social history and family history with the patient and they are unchanged from previous note.  ALLERGIES:  has No Known Allergies.  MEDICATIONS:  Current Outpatient Medications  Medication Sig Dispense Refill   acetaminophen (TYLENOL) 500 MG tablet Take 1,000 mg by mouth every 6 (six) hours as needed.     aspirin EC 81 MG tablet Take 81 mg by mouth daily.     atorvastatin (LIPITOR) 20 MG tablet Take 20 mg by mouth every evening.      Calcium Carbonate-Vitamin D (CALCIUM-VITAMIN D) 500-200 MG-UNIT per tablet Take 1 tablet by mouth daily.     citalopram (CELEXA) 20 MG tablet Take 20 mg by mouth at bedtime.  (Patient not taking: Reported on 01/23/2022)     estradiol (ESTRACE) 0.1 MG/GM vaginal cream PLACE 1 APPLICATORFUL VAGINALLY 3 TIMES A WEEK 42.5 g 12   gabapentin (NEURONTIN) 600 MG tablet Take 600 mg by mouth at bedtime as needed. (Patient not taking: Reported on 01/23/2022)     levothyroxine (SYNTHROID, LEVOTHROID) 100 MCG tablet Take 100 mcg by mouth daily before breakfast.      losartan-hydrochlorothiazide (HYZAAR) 100-25 MG tablet  (Patient not taking: Reported on 01/23/2022)     magnesium oxide (MAG-OX) 400 (241.3 Mg) MG tablet Take 1 tablet (400 mg total) by mouth daily. 30 tablet 11   metoprolol succinate (TOPROL-XL) 25 MG 24 hr tablet Take 25 mg by mouth daily.     Multiple Vitamin (MULTIVITAMIN WITH MINERALS) TABS Take 1 tablet by mouth daily.     valACYclovir (VALTREX) 1000  MG tablet valacyclovir 1 gram tablet     No current facility-administered medications for this visit.    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 Her cancer was treated with surgery but no radiation or chemotherapy.   Serous endometrial cancer, MSI stable ER positive, PR neg, Her2/neu 3+      Endometrial cancer ([place])  08/29/2016 Pathology Results   Endometrium, biopsy - HIGH GRADE ENDOMETRIAL CARCINOMA, SEE COMMENT. Microscopic Comment The sections show multiple fragments of adenocarcinoma displaying glandular and papillary patterns associated with high grade cytomorphology characterized by nuclear pleomorphism, prominent nucleoli and brisk mitosis. Immunohistochemical stains show that the tumor cells are positive for vimentin, p16, p53 with increased Ki-67 expression. Estrogen and progesterone receptor stains show patchy weak positivity. No significant positivity is seen with CEA. The findings are consistent with high grade endometrial carcinoma and the overall morphology and phenotypic features favor serous carcinoma.   08/29/2016 Initial Diagnosis   She presented with postmenopausal bleeding   10/03/2016 Imaging   CT C/A/P 09/2016 IMPRESSION: 1. Thickening of the endometrial canal up to 19 mm in fundus, presumably corresponding to the patient's reported endometrial carcinoma. 2. Multiple tiny pulmonary nodules scattered throughout the lungs bilaterally measuring 4 mm or less in size. Nodules of this size are typically considered statistically likely benign. In the setting of known primary malignancy, metastatic disease to the lungs is not excluded, but is not strongly favored on today's examination. Attention on followup studies is recommended to ensure the stability or resolution of these nodules. 3. Subcentimeter low-attenuation lesion in the central aspect of segment 8 of the liver is too small to characterize. This is statistically likely a tiny cyst, but warrants attention on follow-up studies to exclude the possibility of a solitary hepatic metastasis. 4. 1.5 x 1.5 x 1.7 cm  well-circumscribed lesion in the proximal stomach. This is of uncertain etiology and significance, and could represent a benign gastric polyp, however, further evaluation with nonemergent endoscopy is suggested in the near future for further evaluation. 5. **An incidental finding of potential clinical significance has been found. 1.1 x 1.6 cm thyroid nodule in the inferior aspect of the right lobe of the thyroid gland. Follow-up evaluation with nonemergent thyroid ultrasound is recommended in the near future to better evaluate this finding. This recommendation follows ACR consensuss guidelines: Managing Incidental Thyroid Nodules Detected on Imaging: White Paper of the ACR Incidental Thyroid Findings Committee. J Am Coll Radiol 2015;12(2):143-150.** 6. Aortic atherosclerosis, in addition to left anterior descending coronary artery disease   10/22/2016 Surgery   Robotic assisted total hysterectomy, BSO and bilateral pelvic lymphadenectomy  Final pathology revealed a 3cm polyp containing serous carcinoma but with no myometrial invasion, no LVSI and negative nodes.  Stage IA Uterine serous cancer   10/22/2016 Pathology Results   1. Lymph nodes, regional resection, right pelvic - SIX BENIGN LYMPH NODES (0/6). 2. Lymph nodes, regional resection, left pelvic - SEVEN BENIGN LYMPH NODES (0/7). 3. Uterus +/- tubes/ovaries, neoplastic, with right ovary and fallopian tube ENDOMYOMETRIUM - SEROUS CARCINOMA ARISING WITHIN AN ENDOMETRIAL POLYP - NO MYOMETRIAL INVASION IDENTIFIED - ADENOMYOSIS - LEIOMYOMA (1 CM) - SEE ONCOLOGY TABLE AND COMMENT CERVIX - CARCINOMA FOCALLY INVOLVES ENDOCERVICAL GLANDS - NABOTHIAN CYSTS RIGHT ADNEXA - BENIGN OVARY AND FALLOPIAN TUBE - NO CARCINOMA IDENTIFIED 4. Cul-de-sac biopsy - MESOTHELIAL HYPERPLASIA Microscopic  Comment 3. ONCOLOGY TABLE-UTERUS, CARCINOMA OR CARCINOSARCOMA Specimen: Uterus, right fallopian tube and ovary Procedure: Total hysterectomy and right  salpingo-oophorectomy Lymph node sampling performed: Bilateral pelvic regional resection Specimen integrity: Intact Maximum tumor size: 3 cm (polyp) Histologic type: Serous carcinoma Grade: High grade Myometrial invasion: Not identified Cervical stromal involvement: No, focal endocervical gland involvement Extent of involvement of other organs: Not identified Lymph - vascular invasion: Not identified Peritoneal washings: N/A Lymph nodes: Examined: 0 Sentinel 13 Non-sentinel 13 Total Lymph nodes with metastasis: 0 Isolated tumor cells (< 0.2 mm): 0 Micrometastasis: (> 0.2 mm and < 2.0 mm): 0 Macrometastasis: (> 2.0 mm): 0 Extracapsular extension: N/A Pelvic lymph nodes: 0 involved of 13 lymph nodes. Para-aortic lymph nodes: No para-aortic nodes submitted TNM code: pT1a, pNX FIGO Stage (based on pathologic findings, needs clinical correlation): IA Comment: Immunohistochemistry for cytokeratin AE1/AE3 is performed on all of the lymph nodes (parts 1 & 2) and no metastatic carcinoma is identified.   07/04/2017 Genetic Testing   The patient had genetic testing due to a personal history of breast and uterine cancer, and a family history of stomach cancer.  The Multi-Cancer Panel was ordered. The Multi-Cancer Panel offered by [facility] includes sequencing and/or deletion duplication testing of the following 83 genes: ALK, APC, ATM, AXIN2,BAP1,  BARD1, BLM, BMPR1A, BRCA1, BRCA2, BRIP1, CASR, CDC73, CDH1, CDK4, CDKN1B, CDKN1C, CDKN2A (p14ARF), CDKN2A (p16INK4a), CEBPA, CHEK2, CTNNA1, DICER1, DIS3L2, EGFR (c.2369C>T, p.Thr790Met variant only), EPCAM (Deletion/duplication testing only), FH, FLCN, GATA2, GPC3, GREM1 (Promoter region deletion/duplication testing only), HOXB13 (c.251G>A, p.Gly84Glu), HRAS, KIT, MAX, MEN1, MET, MITF (c.952G>A, p.Glu318Lys variant only), MLH1, MSH2, MSH3, MSH6, MUTYH, NBN, NF1, NF2, NTHL1, PALB2, PDGFRA, PHOX2B, PMS2, POLD1, POLE, POT1, PRKAR1A, PTCH1, PTEN, RAD50, RAD51C,  RAD51D, RB1, RECQL4, RET, RUNX1, SDHAF2, SDHA (sequence changes only), SDHB, SDHC, SDHD, SMAD4, SMARCA4, SMARCB1, SMARCE1, STK11, SUFU, TERC, TERT, TMEM127, TP53, TSC1, TSC2, VHL, WRN and WT1.   Results: No pathogenic variants were identified.  A variant of uncertain significance in the gene APC was identified.  c.791A>G (p.Gln264Arg).  The date of this test report is 07/04/2017.     Genetic Testing   Patient has genetic testing done for MSI. 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    12/03/2017 Imaging   CT Chest/Abd/Pelvis to follow pulmonary nodule and gastric mass IMPRESSION: 1. Stable CT of the chest. Small pulmonary nodules are unchanged when compared with previous exam. 2. No new findings identified. 3. Subcentimeter low-attenuation lesions within the liver are remain too small to characterize but are stable from prior exam. 4. Persistent indeterminate low-attenuation structure within the proximal stomach is unchanged measuring 1.4 cm. Correlation with direct visualization is advised   06/30/2018 Imaging   CT CHEST Lungs/Pleura: Stable scattered sub-cm pulmonary nodules are again seen bilaterally and are stable compared to previous studies. No new or enlarging pulmonary nodules or masses identified. No evidence of pulmonary infiltrate or pleural effusion.   08/19/2018 Echocardiogram   ECHO is done in [place]: EF 60-65%. Mild impaired relaxation. (report scanned)   09/17/2018 Relapse/Recurrence   Presented with c/o vagina discharge.  Lesion noted at the left vaginal apex 38m lesion removed Path c/w high grade serous cancer   09/17/2018 Pathology Results   Vagina, biopsy, left apex - HIGH GRADE SEROUS CARCINOMA.   09/28/2018 PET scan   1. Two hypermetabolic axial lymph nodes. Unusual site for metastatic endometrial carcinoma however the activity is more intense than typically seen in reactive adenopathy. Suggest ultrasound-guided percutaneous biopsy  of  the larger RIGHT axial lymph node 2. No evidence of local recurrence at the vaginal cuff.  3. No metastatic adenopathy in the abdomen or pelvis. 4. Stable small pulmonary nodules   10/09/2018 Pathology Results   Lymph node, needle/core biopsy, right axilla - METASTATIC CARCINOMA, SEE COMMENT. Microscopic Comment The carcinoma appears high grade. Immunohistochemistry is positive for cytokeratin 7, PAX-8, and ER. Cytokeratin 20, CDX-2, PR, and GATA-3 are negative. The findings along with the history are consistent with a gynecologic primary.    10/09/2018 Procedure   Ultrasound-guided core biopsies of a right axillary lymph node.   10/14/2018 Cancer Staging   Staging form: Corpus Uteri - Carcinoma and Carcinosarcoma, AJCC 8th Edition - Clinical: Stage IVB (cT1, cN0, pM1) - Signed by [name], MD on 10/14/2018   10/19/2018 Imaging   Placement of single lumen port a cath via right internal jugular vein. The catheter tip lies at the cavo-atrial junction. A power injectable port a cath was placed and is ready for immediate use.    10/21/2018 -  Chemotherapy   The patient had carboplatin and taxol for treatment   11/11/2018 - 03/12/2022 Chemotherapy   Patient is on Treatment Plan : BREAST Trastuzumab q21d     12/21/2018 PET scan   1. Interval resolution of hypermetabolic right axillary lymph nodes. No metabolic findings highly suspicious for recurrent metastatic disease. 2. New mild hypermetabolism within a borderline prominent portacaval lymph node, nonspecific. While a reactive node is favored, a lymph node metastasis cannot be entirely excluded. Suggest attention to this lymph node on follow-up PET-CT in 3-6 months. 3. Nonspecific new hypermetabolism at the ileocecal valve, more likely physiologic given absence of CT correlate. 4. Scattered subcentimeter pulmonary nodules are all stable and below PET resolution, more likely benign, continued CT surveillance advised. 5. Chronic findings include:  Aortic Atherosclerosis (ICD10-I70.0). Marked diffuse colonic diverticulosis. Coronary atherosclerosis.     12/28/2018 Echocardiogram   1. The left ventricle has normal systolic function with an ejection fraction of 60-65%. The cavity size was normal. Left ventricular diastolic Doppler parameters are consistent with impaired relaxation.  2. GLS recorded as -10.7 but LV appears hyperdynamic and tracking of endocardium appears poor.  3. The right ventricle has normal systolic function. The cavity was normal. There is no increase in right ventricular wall thickness.  4. Mild thickening of the mitral valve leaflet.  5. The aortic valve was not well visualized. Mild thickening of the aortic valve. Aortic valve regurgitation is trivial by color flow Doppler.   03/23/2019 Imaging   PET 1. No findings of hypermetabolic residual/recurrent or metastatic disease. 2. Similar low-level hypermetabolism within normal sized portocaval and right inguinal nodes, favored to be reactive. 3. Ongoing stability of small bilateral pulmonary nodules, favored to be benign. Below PET resolution. 4. Coronary artery atherosclerosis. Aortic Atherosclerosis   03/26/2019 Echocardiogram   1. The left ventricle has normal systolic function, with an ejection fraction of 55-60%. The cavity size was normal. Left ventricular diastolic Doppler parameters are consistent with impaired relaxation.  2. The right ventricle has normal systolic function. The cavity was normal.  3. The mitral valve is abnormal. Mild thickening of the mitral valve leaflet. There is mild mitral annular calcification present.  4. The tricuspid valve is grossly normal.  5. The aortic valve is tricuspid. Mild calcification of the aortic valve. No stenosis of the aortic valve.  6. The aorta is normal unless otherwise noted.  7. Normal LV systolic function; grade 1 diastolic dysfunction;  GLS-15.2%.   06/30/2019 Imaging   Chest Impression:   1. No evidence  thoracic metastasis. 2. Stable small benign-appearing pulmonary nodules   Abdomen / Pelvis Impression:   1. No evidence of metastatic disease in the abdomen pelvis. 2.  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 3.  Aortic Atherosclerosis (ICD10-I70.0).   06/30/2019 Echocardiogram    1. Left ventricular ejection fraction, by visual estimation, is 60 to 65%. The left ventricle has normal function. There is no left ventricular hypertrophy.  2. Left ventricular diastolic parameters are consistent with Grade I diastolic dysfunction (impaired relaxation).  3. The left ventricle has no regional wall motion abnormalities.  4. Global right ventricle has normal systolic function.The right ventricular size is normal. No increase in right ventricular wall thickness.  5. Left atrial size was mild-moderately dilated.  6. Right atrial size was normal.  7. The mitral valve is normal in structure. Trivial mitral valve regurgitation.  8. The tricuspid valve is normal in structure. Tricuspid valve regurgitation is trivial.  9. The aortic valve is normal in structure. Aortic valve regurgitation is trivial. No evidence of aortic valve sclerosis or stenosis. 10. The pulmonic valve was grossly normal. Pulmonic valve regurgitation is not visualized. 11. Mildly elevated pulmonary artery systolic pressure. 12. The inferior vena cava is normal in size with greater than 50% respiratory variability, suggesting right atrial pressure of 3 mmHg. 13. The average left ventricular global longitudinal strain is -12.5 %. GLS underestimated due to poor endocardial tracking.     09/27/2019 Imaging   1. Multiple small, nonspecific pulmonary nodules are again noted. The previously noted lung nodules are unchanged in size from previous exam. There is a new lung nodule within the medial right lower lobe which is nonspecific measure 4 mm. Attention at follow-up imaging is recommended. 2. No evidence of metastatic disease  within the abdomen or pelvis. 3. Coronary artery calcifications.  The 4.  Aortic Atherosclerosis (ICD10-I70.0).   09/30/2019 Echocardiogram    1. Left ventricular ejection fraction, by estimation, is 60 to 65%. The left ventricle has normal function. The left ventricle has no regional wall motion abnormalities. Left ventricular diastolic parameters are consistent with Grade I diastolic dysfunction (impaired relaxation).  2. Right ventricular systolic function is normal. The right ventricular size is normal. There is normal pulmonary artery systolic pressure. The estimated right ventricular systolic pressure is 35.5 mmHg.  3. The mitral valve is normal in structure. No evidence of mitral valve regurgitation. No evidence of mitral stenosis.  4. The aortic valve is normal in structure. Aortic valve regurgitation is trivial. No aortic stenosis is present.  5. The inferior vena cava is normal in size with greater than 50% respiratory variability, suggesting right atrial pressure of 3 mmHg.     01/07/2020 Echocardiogram    1. Normal LV function; grade 1 diastolic dysfunction; mild AI; GLS -21%.  2. Left ventricular ejection fraction, by estimation, is 55 to 60%. The left ventricle has normal function. The left ventricle has no regional wall motion abnormalities. Left ventricular diastolic parameters are consistent with Grade I diastolic dysfunction (impaired relaxation).  3. Right ventricular systolic function is normal. The right ventricular size is normal.  4. The mitral valve is normal in structure. Trivial mitral valve regurgitation. No evidence of mitral stenosis.  5. The aortic valve is tricuspid. Aortic valve regurgitation is mild. Mild aortic valve sclerosis is present, with no evidence of aortic valve stenosis.  6. The inferior vena cava is normal in size with  greater than 50% respiratory variability, suggesting right atrial pressure of 3 mmHg.     03/27/2020 Imaging   1. Status post  hysterectomy. No evidence of recurrent or metastatic disease in the abdomen or pelvis. 2. Multiple small pulmonary nodules in the lung bases are unchanged to the extent that they are included on current examination and remain most likely sequelae of prior infection or inflammation. Attention on follow-up. 3. Aortic Atherosclerosis (ICD10-I70.0).   04/17/2020 Echocardiogram   1. Normal LVEF, normal and unchanged GLS: -20.2%.  2. Left ventricular ejection fraction, by estimation, is 60 to 65%. The left ventricle has normal function. The left ventricle has no regional wall motion abnormalities. There is mild concentric left ventricular hypertrophy. Left ventricular diastolic parameters are consistent with Grade I diastolic dysfunction (impaired relaxation). The average left ventricular global longitudinal strain is -20.2 %. The global longitudinal strain is normal.  3. Right ventricular systolic function is normal. The right ventricular size is normal.  4. The mitral valve is normal in structure. Mild mitral valve regurgitation. No evidence of mitral stenosis.  5. The aortic valve is normal in structure. There is mild calcification of the aortic valve. There is mild thickening of the aortic valve. Aortic valve regurgitation is mild. No aortic stenosis is present.  6. The inferior vena cava is normal in size with greater than 50% respiratory variability, suggesting right atrial pressure of 3 mmHg.   08/02/2020 Echocardiogram   1. Left ventricular ejection fraction, by estimation, is 55 to 60%. The left ventricle has normal function. The left ventricle has no regional wall motion abnormalities. Left ventricular diastolic parameters are consistent with Grade I diastolic dysfunction (impaired relaxation). The average left ventricular global longitudinal strain is -27.0 %. The global longitudinal strain is normal.  2. Right ventricular systolic function is normal. The right ventricular size is normal. Tricuspid  regurgitation signal is inadequate for assessing PA pressure.  3. The mitral valve is grossly normal. Trivial mitral valve regurgitation. No evidence of mitral stenosis.  4. The aortic valve is tricuspid. Aortic valve regurgitation is mild. No aortic stenosis is present.  5. The inferior vena cava is normal in size with greater than 50% respiratory variability, suggesting right atrial pressure of 3 mmHg.   09/19/2020 Imaging   1. Status post hysterectomy. No evidence of recurrent or new metastatic disease within the chest, abdomen, or pelvis. 2. Scattered bilateral tiny pulmonary nodules, unchanged from prior studies and most likely sequela of prior infection or inflammation, recommend attention on follow-up imaging. No new suspicious pulmonary nodules or masses. 3. Subcutaneous edema and small hematoma overlying the posterior gluteal musculature, recommend correlation with recent history of trauma. 4. Mild low-density wall thickening of the gastric antrum, which may represent gastritis. 5. Hepatic steatosis. 6. Extensive sigmoid colonic diverticulosis without findings of acute diverticulitis. 7. Aortic atherosclerosis.  Aortic Atherosclerosis (ICD10-I70.0).   11/20/2020 Echocardiogram    1. Compared to echo from Jan 2022, Global longitudinal strain is less negative. (Previously -27%). Left ventricular ejection fraction, by estimation, is 60 to 65%. The left ventricle has normal function. The left ventricle has no regional wall motion abnormalities. There is mild left ventricular hypertrophy. Left ventricular diastolic parameters are indeterminate.  2. Right ventricular systolic function is normal. The right ventricular size is normal.  3. The mitral valve is normal in structure. Trivial mitral valve regurgitation.  4. Aortic valve regurgitation is mild. Mild aortic valve sclerosis is present, with no evidence of aortic valve stenosis.  03/12/2021 Echocardiogram   1. Left ventricular ejection  fraction, by estimation, is 60 to 65%. Left ventricular ejection fraction by 2D MOD biplane is 63.6 %. The left ventricle has normal function. The left ventricle has no regional wall motion abnormalities. There is mild left ventricular hypertrophy. Left ventricular diastolic parameters are consistent with Grade I diastolic dysfunction (impaired relaxation). The average left ventricular global longitudinal strain is -20.0 %. The global longitudinal strain is normal.  2. Right ventricular systolic function is normal. The right ventricular size is normal. There is normal pulmonary artery systolic pressure. The estimated right ventricular systolic pressure is 54.6 mmHg.  3. The mitral valve is normal in structure. No evidence of mitral valve regurgitation. No evidence of mitral stenosis.  4. The aortic valve is tricuspid. Aortic valve regurgitation is trivial. No aortic stenosis is present. Aortic regurgitation PHT measures 403 msec.  5. The inferior vena cava is normal in size with greater than 50% respiratory variability, suggesting right atrial pressure of 3 mmHg.   04/02/2021 Imaging   Stable small pulmonary nodules scattered throughout the chest, unchanged, without new or suspicious pulmonary nodule.   Three-vessel coronary artery disease.   Aortic Atherosclerosis (ICD10-I70.0).   06/06/2021 Imaging    1. Left ventricular ejection fraction, by estimation, is 60 to 65%. The left ventricle has normal function. The left ventricle has no regional wall motion abnormalities. Left ventricular diastolic parameters are consistent with Grade I diastolic dysfunction (impaired relaxation).  2. Right ventricular systolic function is normal. The right ventricular size is normal. There is normal pulmonary artery systolic pressure. The estimated right ventricular systolic pressure is 50.3 mmHg.  3. Left atrial size was mildly dilated.  4. The mitral valve is normal in structure. Trivial mitral valve  regurgitation.  5. The aortic valve is tricuspid. Aortic valve regurgitation is mild. No aortic stenosis is present.  6. The inferior vena cava is normal in size with greater than 50% respiratory variability, suggesting right atrial pressure of 3 mmHg.  7. GLS attempted but not reported due to suboptimal tracking.     09/25/2021 Echocardiogram    1. Left ventricular ejection fraction, by estimation, is 60 to 65%. Left ventricular ejection fraction by 3D volume is 56 %. The left ventricle has normal function. The left ventricle has no regional wall motion abnormalities. Left ventricular diastolic parameters were normal.  2. Right ventricular systolic function is normal. The right ventricular size is normal. There is normal pulmonary artery systolic pressure.  3. The mitral valve is normal in structure. Trivial mitral valve regurgitation. No evidence of mitral stenosis.  4. The aortic valve is tricuspid. Aortic valve regurgitation is mild. No aortic stenosis is present.  5. The inferior vena cava is normal in size with greater than 50% respiratory variability, suggesting right atrial pressure of 3 mmHg.   10/15/2021 Imaging   1. Stable examination post prior hysterectomy without evidence of local recurrence or metastatic disease within the chest, abdomen, or pelvis. 2. Unchanged small bilateral pulmonary nodules, likely benign. 3. Left-sided colonic diverticulosis without findings of acute diverticulitis. 4. Aortic Atherosclerosis (ICD10-I70.0).       12/31/2021 Echocardiogram    1. Left ventricular ejection fraction, by estimation, is 65 to 70%. The left ventricle has normal function. The left ventricle has no regional wall motion abnormalities. Left ventricular diastolic parameters are consistent with Grade I diastolic dysfunction (impaired relaxation).  2. Right ventricular systolic function is normal. The right ventricular size is normal. There is normal pulmonary artery  systolic pressure. The  estimated right ventricular systolic pressure is 26.7 mmHg.  3. The mitral valve is grossly normal. Trivial mitral valve regurgitation. No evidence of mitral stenosis.  4. The aortic valve is tricuspid. Aortic valve regurgitation is mild. No aortic stenosis is present.  5. The inferior vena cava is normal in size with greater than 50% respiratory variability, suggesting right atrial pressure of 3 mmHg.     04/02/2022 -  Chemotherapy   Patient is on Treatment Plan : BREAST MAINTENANCE Trastuzumab IV (6) or SQ (600) D1 q21d X 11 Cycles     04/04/2022 Echocardiogram    1. Left ventricular ejection fraction, by estimation, is 65 to 70%. The left ventricle has normal function. The left ventricle has no regional wall motion abnormalities. Left ventricular diastolic parameters are consistent with Grade I diastolic dysfunction (impaired relaxation).  2. Right ventricular systolic function is normal. The right ventricular size is normal.  3. Left atrial size was mildly dilated.  4. The mitral valve is normal in structure. Trivial mitral valve regurgitation. No evidence of mitral stenosis.  5. The aortic valve is tricuspid. There is mild calcification of the aortic valve. Aortic valve regurgitation is mild. No aortic stenosis is present. Aortic regurgitation PHT measures 613 msec. Aortic valve area, by VTI measures 1.70 cm. Aortic valve mean gradient measures 3.0 mmHg. Aortic valve Vmax measures 1.15 m/s.  6. The inferior vena cava is normal in size with greater than 50% respiratory variability, suggesting right atrial pressure of 3 mmHg.   Metastasis to lymph nodes ([place])  10/14/2018 Initial Diagnosis   Metastasis to lymph nodes ([place])   10/21/2018 - 02/24/2019 Chemotherapy   The patient had palonosetron (ALOXI) injection 0.25 mg, 0.25 mg, Intravenous,  Once, 7 of 7 cycles Administration: 0.25 mg (10/21/2018), 0.25 mg (11/11/2018), 0.25 mg (12/02/2018), 0.25 mg (12/23/2018), 0.25 mg (01/13/2019), 0.25 mg (02/03/2019),  0.25 mg (02/24/2019) CARBOplatin (PARAPLATIN) 310 mg in sodium chloride 0.9 % 250 mL chemo infusion, 310 mg, Intravenous,  Once, 7 of 7 cycles Dose modification: 300 mg (original dose 311.5 mg, Cycle 4, Reason: Dose not tolerated) Administration: 310 mg (10/21/2018), 300 mg (11/11/2018), 300 mg (12/02/2018), 300 mg (12/23/2018), 300 mg (01/13/2019), 300 mg (02/03/2019), 300 mg (02/24/2019) PACLitaxel (TAXOL) 222 mg in sodium chloride 0.9 % 250 mL chemo infusion (> 41m/m2), 135 mg/m2 = 222 mg, Intravenous,  Once, 7 of 7 cycles Administration: 222 mg (10/21/2018), 222 mg (11/11/2018), 222 mg (12/02/2018), 222 mg (12/23/2018), 222 mg (01/13/2019), 222 mg (02/03/2019), 222 mg (02/24/2019) fosaprepitant (EMEND) 150 mg, dexamethasone (DECADRON) 12 mg in sodium chloride 0.9 % 145 mL IVPB, , Intravenous,  Once, 7 of 7 cycles Administration:  (10/21/2018),  (11/11/2018),  (12/02/2018),  (12/23/2018),  (01/13/2019),  (02/03/2019),  (02/24/2019)  for chemotherapy treatment.    04/02/2022 -  Chemotherapy   Patient is on Treatment Plan : BREAST MAINTENANCE Trastuzumab IV (6) or SQ (600) D1 q21d X 11 Cycles       PHYSICAL EXAMINATION: ECOG PERFORMANCE STATUS: 0 - Asymptomatic  Vitals:   04/23/22 1033  BP: (!) 146/63  Pulse: 66  Resp: 18  SpO2: 98%   Filed Weights   04/23/22 1033  Weight: 133 lb 12.8 oz (60.7 kg)    GENERAL:alert, no distress and comfortable SKIN: skin color, texture, turgor are normal, no rashes or significant lesions EYES: normal, Conjunctiva are pink and non-injected, sclera clear OROPHARYNX:no exudate, no erythema and lips, buccal mucosa, and tongue normal  NECK: supple, thyroid normal size,  non-tender, without nodularity LYMPH:  no palpable lymphadenopathy in the cervical, axillary or inguinal LUNGS: clear to auscultation and percussion with normal breathing effort HEART: regular rate & rhythm and no murmurs and no lower extremity edema ABDOMEN:abdomen soft, non-tender and normal bowel  sounds Musculoskeletal:no cyanosis of digits and no clubbing  NEURO: alert & oriented x 3 with fluent speech, no focal motor/sensory deficits  LABORATORY DATA:  I have reviewed the data as listed    Component Value Date/Time   NA 136 03/12/2022 1000   NA 139 02/13/2017 1512   K 3.5 03/12/2022 1000   K 4.2 02/13/2017 1512   CL 100 03/12/2022 1000   CO2 27 03/12/2022 1000   CO2 28 02/13/2017 1512   GLUCOSE 106 (H) 03/12/2022 1000   GLUCOSE 118 02/13/2017 1512   BUN 29 (H) 03/12/2022 1000   BUN 25.2 02/13/2017 1512   CREATININE 1.28 (H) 03/12/2022 1000   CREATININE 1.37 (H) 12/21/2019 0850   CREATININE 1.2 (H) 02/13/2017 1512   CALCIUM 9.5 03/12/2022 1000   CALCIUM 10.0 02/13/2017 1512   PROT 6.8 03/12/2022 1000   ALBUMIN 4.4 03/12/2022 1000   AST 23 03/12/2022 1000   AST 19 12/21/2019 0850   ALT 13 03/12/2022 1000   ALT 13 12/21/2019 0850   ALKPHOS 51 03/12/2022 1000   BILITOT 0.4 03/12/2022 1000   BILITOT 0.4 12/21/2019 0850   GFRNONAA 42 (L) 03/12/2022 1000   GFRNONAA 37 (L) 12/21/2019 0850   GFRAA 47 (L) 03/28/2020 0933   GFRAA 42 (L) 12/21/2019 0850    No results found for: "SPEP", "UPEP"  Lab Results  Component Value Date   WBC 6.5 04/23/2022   NEUTROABS 3.9 04/23/2022   HGB 10.9 (L) 04/23/2022   HCT 30.6 (L) 04/23/2022   MCV 88.7 04/23/2022   PLT 229 04/23/2022      Chemistry      Component Value Date/Time   NA 136 03/12/2022 1000   NA 139 02/13/2017 1512   K 3.5 03/12/2022 1000   K 4.2 02/13/2017 1512   CL 100 03/12/2022 1000   CO2 27 03/12/2022 1000   CO2 28 02/13/2017 1512   BUN 29 (H) 03/12/2022 1000   BUN 25.2 02/13/2017 1512   CREATININE 1.28 (H) 03/12/2022 1000   CREATININE 1.37 (H) 12/21/2019 0850   CREATININE 1.2 (H) 02/13/2017 1512      Component Value Date/Time   CALCIUM 9.5 03/12/2022 1000   CALCIUM 10.0 02/13/2017 1512   ALKPHOS 51 03/12/2022 1000   AST 23 03/12/2022 1000   AST 19 12/21/2019 0850   ALT 13 03/12/2022 1000   ALT  13 12/21/2019 0850   BILITOT 0.4 03/12/2022 1000   BILITOT 0.4 12/21/2019 0850       RADIOGRAPHIC STUDIES: I have personally reviewed the radiological images as listed and agreed with the findings in the report. ECHOCARDIOGRAM COMPLETE  Result Date: 04/04/2022    ECHOCARDIOGRAM REPORT   Patient Name:   Vicki Cervantes Date of Exam: 04/04/2022 Medical Rec #:  [id number]      Height:       62.0 in Accession #:    [id number]     Weight:       132.5 lb Date of Birth:  Aug 06, 1940      BSA:          1.605 m Patient Age:    4 years       BP:  128/74 mmHg Patient Gender: F              HR:           73 bpm. Exam Location:  Outpatient Procedure: 2D Echo and Strain Analysis Indications:    chemo  History:        Patient has prior history of Echocardiogram examinations, most                 recent 12/31/2021. Signs/Symptoms:Chest Pain; Risk                 Factors:Hypertension.  Sonographer:    Harvie Junior Referring Phys: [id number] Waldemar Siegel [place]  1. Left ventricular ejection fraction, by estimation, is 65 to 70%. The left ventricle has normal function. The left ventricle has no regional wall motion abnormalities. Left ventricular diastolic parameters are consistent with Grade I diastolic dysfunction (impaired relaxation).  2. Right ventricular systolic function is normal. The right ventricular size is normal.  3. Left atrial size was mildly dilated.  4. The mitral valve is normal in structure. Trivial mitral valve regurgitation. No evidence of mitral stenosis.  5. The aortic valve is tricuspid. There is mild calcification of the aortic valve. Aortic valve regurgitation is mild. No aortic stenosis is present. Aortic regurgitation PHT measures 613 msec. Aortic valve area, by VTI measures 1.70 cm. Aortic valve mean gradient measures 3.0 mmHg. Aortic valve Vmax measures 1.15 m/s.  6. The inferior vena cava is normal in size with greater than 50% respiratory variability, suggesting right atrial pressure of  3 mmHg. FINDINGS  Left Ventricle: Left ventricular ejection fraction, by estimation, is 65 to 70%. The left ventricle has normal function. The left ventricle has no regional wall motion abnormalities. The left ventricular internal cavity size was normal in size. There is  no left ventricular hypertrophy. Left ventricular diastolic parameters are consistent with Grade I diastolic dysfunction (impaired relaxation). Right Ventricle: The right ventricular size is normal. No increase in right ventricular wall thickness. Right ventricular systolic function is normal. Left Atrium: Left atrial size was mildly dilated. Right Atrium: Right atrial size was normal in size. Pericardium: There is no evidence of pericardial effusion. Mitral Valve: The mitral valve is normal in structure. Trivial mitral valve regurgitation. No evidence of mitral valve stenosis. Tricuspid Valve: The tricuspid valve is normal in structure. Tricuspid valve regurgitation is not demonstrated. No evidence of tricuspid stenosis. Aortic Valve: The aortic valve is tricuspid. There is mild calcification of the aortic valve. Aortic valve regurgitation is mild. Aortic regurgitation PHT measures 613 msec. No aortic stenosis is present. Aortic valve mean gradient measures 3.0 mmHg. Aortic valve peak gradient measures 5.3 mmHg. Aortic valve area, by VTI measures 1.70 cm. Pulmonic Valve: The pulmonic valve was normal in structure. Pulmonic valve regurgitation is trivial. No evidence of pulmonic stenosis. Aorta: The aortic root is normal in size and structure. Venous: The inferior vena cava is normal in size with greater than 50% respiratory variability, suggesting right atrial pressure of 3 mmHg. IAS/Shunts: No atrial level shunt detected by color flow Doppler.  LEFT VENTRICLE PLAX 2D LVIDd:         4.00 cm     Diastology LVIDs:         2.60 cm     LV e' medial:    5.98 cm/s LV PW:         0.90 cm     LV E/e' medial:  13.2 LV IVS:  0.80 cm     LV e'  lateral:   7.29 cm/s LVOT diam:     1.90 cm     LV E/e' lateral: 10.8 LV SV:         39 LV SV Index:   24 LVOT Area:     2.84 cm  LV Volumes (MOD) LV vol d, MOD A2C: 76.4 ml LV vol d, MOD A4C: 85.6 ml LV vol s, MOD A2C: 41.9 ml LV vol s, MOD A4C: 38.2 ml LV SV MOD A2C:     34.5 ml LV SV MOD A4C:     85.6 ml LV SV MOD BP:      40.9 ml RIGHT VENTRICLE RV Basal diam:  3.40 cm RV Mid diam:    3.00 cm RV S prime:     12.40 cm/s TAPSE (M-mode): 1.8 cm LEFT ATRIUM             Index        RIGHT ATRIUM           Index LA diam:        3.10 cm 1.93 cm/m   RA Area:     12.20 cm LA Vol (A2C):   55.4 ml 34.52 ml/m  RA Volume:   26.60 ml  16.58 ml/m LA Vol (A4C):   53.4 ml 33.28 ml/m LA Biplane Vol: 54.6 ml 34.02 ml/m  AORTIC VALVE                    PULMONIC VALVE AV Area (Vmax):    1.56 cm     PV Vmax:          0.91 m/s AV Area (Vmean):   1.53 cm     PV Peak grad:     3.3 mmHg AV Area (VTI):     1.70 cm     PR End Diast Vel: 4.24 msec AV Vmax:           115.00 cm/s AV Vmean:          78.400 cm/s AV VTI:            0.228 m AV Peak Grad:      5.3 mmHg AV Mean Grad:      3.0 mmHg LVOT Vmax:         63.40 cm/s LVOT Vmean:        42.400 cm/s LVOT VTI:          0.137 m LVOT/AV VTI ratio: 0.60 AI PHT:            613 msec  AORTA Ao Root diam: 3.30 cm MITRAL VALVE                TRICUSPID VALVE MV Area (PHT): 4.06 cm     TV Peak grad:   7.4 mmHg MV Decel Time: 187 msec     TV Vmax:        1.36 m/s MV E velocity: 78.80 cm/s MV A velocity: 106.00 cm/s  SHUNTS MV E/A ratio:  0.74         Systemic VTI:  0.14 m                             Systemic Diam: 1.90 cm Glori Bickers MD Electronically signed by Glori Bickers MD Signature Date/Time: 04/04/2022/1:28:57 PM    Final

## 2022-04-23 NOTE — Telephone Encounter (Signed)
Returned her call. She is asking about flu vaccine and covid vaccine. Told her Dr. Alvy Bimler recommends that she get flu vaccine and then wait 1 month before getting covid vaccine, if she wants the covid vaccine.

## 2022-04-24 ENCOUNTER — Other Ambulatory Visit: Payer: Self-pay

## 2022-04-26 ENCOUNTER — Telehealth: Payer: Self-pay | Admitting: *Deleted

## 2022-04-26 NOTE — Telephone Encounter (Signed)
Patient called and scheduled an appt with Dr Berline Lopes on 12/8 for a follow up

## 2022-05-14 ENCOUNTER — Inpatient Hospital Stay: Payer: Medicare HMO

## 2022-05-14 ENCOUNTER — Other Ambulatory Visit: Payer: Self-pay

## 2022-05-14 VITALS — BP 149/73 | HR 76 | Temp 98.2°F | Resp 16 | Wt 133.0 lb

## 2022-05-14 DIAGNOSIS — C541 Malignant neoplasm of endometrium: Secondary | ICD-10-CM

## 2022-05-14 DIAGNOSIS — Z7982 Long term (current) use of aspirin: Secondary | ICD-10-CM | POA: Diagnosis not present

## 2022-05-14 DIAGNOSIS — D539 Nutritional anemia, unspecified: Secondary | ICD-10-CM | POA: Diagnosis not present

## 2022-05-14 DIAGNOSIS — Z79899 Other long term (current) drug therapy: Secondary | ICD-10-CM | POA: Diagnosis not present

## 2022-05-14 DIAGNOSIS — Z90722 Acquired absence of ovaries, bilateral: Secondary | ICD-10-CM | POA: Diagnosis not present

## 2022-05-14 DIAGNOSIS — Z9079 Acquired absence of other genital organ(s): Secondary | ICD-10-CM | POA: Diagnosis not present

## 2022-05-14 DIAGNOSIS — C773 Secondary and unspecified malignant neoplasm of axilla and upper limb lymph nodes: Secondary | ICD-10-CM

## 2022-05-14 DIAGNOSIS — N183 Chronic kidney disease, stage 3 unspecified: Secondary | ICD-10-CM | POA: Diagnosis not present

## 2022-05-14 DIAGNOSIS — Z7189 Other specified counseling: Secondary | ICD-10-CM

## 2022-05-14 DIAGNOSIS — Z5112 Encounter for antineoplastic immunotherapy: Secondary | ICD-10-CM | POA: Diagnosis not present

## 2022-05-14 DIAGNOSIS — Z90710 Acquired absence of both cervix and uterus: Secondary | ICD-10-CM | POA: Diagnosis not present

## 2022-05-14 MED ORDER — ACETAMINOPHEN 325 MG PO TABS
650.0000 mg | ORAL_TABLET | Freq: Once | ORAL | Status: AC
  Administered 2022-05-14: 650 mg via ORAL
  Filled 2022-05-14: qty 2

## 2022-05-14 MED ORDER — SODIUM CHLORIDE 0.9 % IV SOLN: Freq: Once | INTRAVENOUS | Status: AC

## 2022-05-14 MED ORDER — DIPHENHYDRAMINE HCL 25 MG PO CAPS
25.0000 mg | ORAL_CAPSULE | Freq: Once | ORAL | Status: AC
  Administered 2022-05-14: 25 mg via ORAL
  Filled 2022-05-14: qty 1

## 2022-05-14 MED ORDER — TRASTUZUMAB-ANNS CHEMO 420 MG IV SOLR
6.0000 mg/kg | Freq: Once | INTRAVENOUS | Status: AC
  Administered 2022-05-14: 357 mg via INTRAVENOUS
  Filled 2022-05-14: qty 17

## 2022-05-14 MED ORDER — SODIUM CHLORIDE 0.9% FLUSH
10.0000 mL | INTRAVENOUS | Status: DC | PRN
  Administered 2022-05-14: 10 mL

## 2022-05-14 MED ORDER — HEPARIN SOD (PORK) LOCK FLUSH 100 UNIT/ML IV SOLN
500.0000 [IU] | Freq: Once | INTRAVENOUS | Status: AC | PRN
  Administered 2022-05-14: 500 [IU]

## 2022-05-14 NOTE — Patient Instructions (Signed)
[facility]  Discharge Instructions: Thank you for choosing [facility] to provide your oncology and hematology care.   If you have a lab appointment with the [facility], please go directly to the [facility] and check in at the registration area.   Wear comfortable clothing and clothing appropriate for easy access to any Portacath or PICC line.   We strive to give you quality time with your provider. You may need to reschedule your appointment if you arrive late (15 or more minutes).  Arriving late affects you and other patients whose appointments are after yours.  Also, if you miss three or more appointments without notifying the office, you may be dismissed from the clinic at the provider's discretion.      For prescription refill requests, have your pharmacy contact our office and allow 72 hours for refills to be completed.    Today you received the following chemotherapy and/or immunotherapy agents: trastuzumab-anns      To help prevent nausea and vomiting after your treatment, we encourage you to take your nausea medication as directed.  BELOW ARE SYMPTOMS THAT SHOULD BE REPORTED IMMEDIATELY: *FEVER GREATER THAN 100.4 F (38 C) OR HIGHER *CHILLS OR SWEATING *NAUSEA AND VOMITING THAT IS NOT CONTROLLED WITH YOUR NAUSEA MEDICATION *UNUSUAL SHORTNESS OF BREATH *UNUSUAL BRUISING OR BLEEDING *URINARY PROBLEMS (pain or burning when urinating, or frequent urination) *BOWEL PROBLEMS (unusual diarrhea, constipation, pain near the anus) TENDERNESS IN MOUTH AND THROAT WITH OR WITHOUT PRESENCE OF ULCERS (sore throat, sores in mouth, or a toothache) UNUSUAL RASH, SWELLING OR PAIN  UNUSUAL VAGINAL DISCHARGE OR ITCHING   Items with * indicate a potential emergency and should be followed up as soon as possible or go to the Emergency Department if any problems should occur.  Please show the CHEMOTHERAPY ALERT CARD or IMMUNOTHERAPY ALERT CARD at  check-in to the Emergency Department and triage nurse.  Should you have questions after your visit or need to cancel or reschedule your appointment, please contact [facility]  Dept: [phone]  and follow the prompts.  Office hours are 8:00 a.m. to 4:30 p.m. Monday - Friday. Please note that voicemails left after 4:00 p.m. may not be returned until the following business day.  We are closed weekends and major holidays. You have access to a nurse at all times for urgent questions. Please call the main number to the clinic Dept: [phone] and follow the prompts.   For any non-urgent questions, you may also contact your provider using MyChart. We now offer e-Visits for anyone 16 and older to request care online for non-urgent symptoms. For details visit [email or web address].   Also download the MyChart app! Go to the app store, search "MyChart", open the app, select [facility], and log in with your MyChart username and password.  Masks are optional in the cancer centers. If you would like for your care team to wear a mask while they are taking care of you, please let them know. You may have one support person who is at least 82 years old accompany you for your appointments.

## 2022-05-22 DIAGNOSIS — M25511 Pain in right shoulder: Secondary | ICD-10-CM | POA: Diagnosis not present

## 2022-05-22 DIAGNOSIS — M25512 Pain in left shoulder: Secondary | ICD-10-CM | POA: Diagnosis not present

## 2022-06-01 ENCOUNTER — Other Ambulatory Visit: Payer: Self-pay

## 2022-06-04 ENCOUNTER — Encounter: Payer: Self-pay | Admitting: Hematology and Oncology

## 2022-06-04 ENCOUNTER — Other Ambulatory Visit: Payer: Self-pay

## 2022-06-04 ENCOUNTER — Inpatient Hospital Stay: Payer: Medicare HMO | Attending: Gynecologic Oncology

## 2022-06-04 ENCOUNTER — Inpatient Hospital Stay: Payer: Medicare HMO

## 2022-06-04 ENCOUNTER — Inpatient Hospital Stay: Payer: Medicare HMO | Admitting: Hematology and Oncology

## 2022-06-04 VITALS — BP 155/62 | HR 73 | Temp 97.7°F | Resp 18 | Ht 62.0 in | Wt 135.8 lb

## 2022-06-04 DIAGNOSIS — Z79899 Other long term (current) drug therapy: Secondary | ICD-10-CM | POA: Diagnosis not present

## 2022-06-04 DIAGNOSIS — Z7189 Other specified counseling: Secondary | ICD-10-CM | POA: Diagnosis not present

## 2022-06-04 DIAGNOSIS — N183 Chronic kidney disease, stage 3 unspecified: Secondary | ICD-10-CM

## 2022-06-04 DIAGNOSIS — C541 Malignant neoplasm of endometrium: Secondary | ICD-10-CM | POA: Diagnosis not present

## 2022-06-04 DIAGNOSIS — Z7982 Long term (current) use of aspirin: Secondary | ICD-10-CM | POA: Diagnosis not present

## 2022-06-04 DIAGNOSIS — Z5112 Encounter for antineoplastic immunotherapy: Secondary | ICD-10-CM | POA: Insufficient documentation

## 2022-06-04 DIAGNOSIS — Z90710 Acquired absence of both cervix and uterus: Secondary | ICD-10-CM | POA: Insufficient documentation

## 2022-06-04 DIAGNOSIS — I1310 Hypertensive heart and chronic kidney disease without heart failure, with stage 1 through stage 4 chronic kidney disease, or unspecified chronic kidney disease: Secondary | ICD-10-CM | POA: Insufficient documentation

## 2022-06-04 DIAGNOSIS — C773 Secondary and unspecified malignant neoplasm of axilla and upper limb lymph nodes: Secondary | ICD-10-CM | POA: Insufficient documentation

## 2022-06-04 DIAGNOSIS — I10 Essential (primary) hypertension: Secondary | ICD-10-CM

## 2022-06-04 LAB — CBC WITH DIFFERENTIAL (CANCER CENTER ONLY)
Abs Immature Granulocytes: 0.03 10*3/uL (ref 0.00–0.07)
Basophils Absolute: 0.1 10*3/uL (ref 0.0–0.1)
Basophils Relative: 1 %
Eosinophils Absolute: 0.3 10*3/uL (ref 0.0–0.5)
Eosinophils Relative: 4 %
HCT: 30.1 % — ABNORMAL LOW (ref 36.0–46.0)
Hemoglobin: 10.7 g/dL — ABNORMAL LOW (ref 12.0–15.0)
Immature Granulocytes: 1 %
Lymphocytes Relative: 33 %
Lymphs Abs: 2.1 10*3/uL (ref 0.7–4.0)
MCH: 31.9 pg (ref 26.0–34.0)
MCHC: 35.5 g/dL (ref 30.0–36.0)
MCV: 89.9 fL (ref 80.0–100.0)
Monocytes Absolute: 0.6 10*3/uL (ref 0.1–1.0)
Monocytes Relative: 10 %
Neutro Abs: 3.2 10*3/uL (ref 1.7–7.7)
Neutrophils Relative %: 51 %
Platelet Count: 229 10*3/uL (ref 150–400)
RBC: 3.35 MIL/uL — ABNORMAL LOW (ref 3.87–5.11)
RDW: 12 % (ref 11.5–15.5)
WBC Count: 6.2 10*3/uL (ref 4.0–10.5)
nRBC: 0 % (ref 0.0–0.2)

## 2022-06-04 LAB — BASIC METABOLIC PANEL - CANCER CENTER ONLY
Anion gap: 8 (ref 5–15)
BUN: 25 mg/dL — ABNORMAL HIGH (ref 8–23)
CO2: 27 mmol/L (ref 22–32)
Calcium: 9.5 mg/dL (ref 8.9–10.3)
Chloride: 103 mmol/L (ref 98–111)
Creatinine: 1.26 mg/dL — ABNORMAL HIGH (ref 0.44–1.00)
GFR, Estimated: 43 mL/min — ABNORMAL LOW (ref >60)
Glucose, Bld: 96 mg/dL (ref 70–99)
Potassium: 3.4 mmol/L — ABNORMAL LOW (ref 3.5–5.1)
Sodium: 138 mmol/L (ref 135–145)

## 2022-06-04 MED ORDER — HEPARIN SOD (PORK) LOCK FLUSH 100 UNIT/ML IV SOLN
500.0000 [IU] | Freq: Once | INTRAVENOUS | Status: AC | PRN
  Administered 2022-06-04: 500 [IU]

## 2022-06-04 MED ORDER — SODIUM CHLORIDE 0.9% FLUSH
10.0000 mL | INTRAVENOUS | Status: DC | PRN
  Administered 2022-06-04: 10 mL

## 2022-06-04 MED ORDER — ACETAMINOPHEN 325 MG PO TABS
650.0000 mg | ORAL_TABLET | Freq: Once | ORAL | Status: AC
  Administered 2022-06-04: 650 mg via ORAL
  Filled 2022-06-04: qty 2

## 2022-06-04 MED ORDER — SODIUM CHLORIDE 0.9 % IV SOLN: Freq: Once | INTRAVENOUS | Status: AC

## 2022-06-04 MED ORDER — TRASTUZUMAB-ANNS CHEMO 150 MG IV SOLR
6.0000 mg/kg | Freq: Once | INTRAVENOUS | Status: AC
  Administered 2022-06-04: 357 mg via INTRAVENOUS
  Filled 2022-06-04: qty 17

## 2022-06-04 MED ORDER — LIDOCAINE-PRILOCAINE 2.5-2.5 % EX CREA: 1.0000 | TOPICAL_CREAM | CUTANEOUS | 3 refills | Status: DC | PRN

## 2022-06-04 MED ORDER — DIPHENHYDRAMINE HCL 25 MG PO CAPS
25.0000 mg | ORAL_CAPSULE | Freq: Once | ORAL | Status: AC
  Administered 2022-06-04: 25 mg via ORAL
  Filled 2022-06-04: qty 1

## 2022-06-04 MED ORDER — SODIUM CHLORIDE 0.9% FLUSH
10.0000 mL | Freq: Once | INTRAVENOUS | Status: AC
  Administered 2022-06-04: 10 mL

## 2022-06-04 NOTE — Assessment & Plan Note (Signed)
Renal function is stable. Monitor closely 

## 2022-06-04 NOTE — Assessment & Plan Note (Signed)
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

## 2022-06-04 NOTE — Patient Instructions (Signed)
[facility]  Discharge Instructions: Thank you for choosing [facility] to provide your oncology and hematology care.   If you have a lab appointment with the [facility], please go directly to the [facility] and check in at the registration area.   Wear comfortable clothing and clothing appropriate for easy access to any Portacath or PICC line.   We strive to give you quality time with your provider. You may need to reschedule your appointment if you arrive late (15 or more minutes).  Arriving late affects you and other patients whose appointments are after yours.  Also, if you miss three or more appointments without notifying the office, you may be dismissed from the clinic at the provider's discretion.      For prescription refill requests, have your pharmacy contact our office and allow 72 hours for refills to be completed.    Today you received the following chemotherapy and/or immunotherapy agents: trastuzumab-anns      To help prevent nausea and vomiting after your treatment, we encourage you to take your nausea medication as directed.  BELOW ARE SYMPTOMS THAT SHOULD BE REPORTED IMMEDIATELY: *FEVER GREATER THAN 100.4 F (38 C) OR HIGHER *CHILLS OR SWEATING *NAUSEA AND VOMITING THAT IS NOT CONTROLLED WITH YOUR NAUSEA MEDICATION *UNUSUAL SHORTNESS OF BREATH *UNUSUAL BRUISING OR BLEEDING *URINARY PROBLEMS (pain or burning when urinating, or frequent urination) *BOWEL PROBLEMS (unusual diarrhea, constipation, pain near the anus) TENDERNESS IN MOUTH AND THROAT WITH OR WITHOUT PRESENCE OF ULCERS (sore throat, sores in mouth, or a toothache) UNUSUAL RASH, SWELLING OR PAIN  UNUSUAL VAGINAL DISCHARGE OR ITCHING   Items with * indicate a potential emergency and should be followed up as soon as possible or go to the Emergency Department if any problems should occur.  Please show the CHEMOTHERAPY ALERT CARD or IMMUNOTHERAPY ALERT CARD at  check-in to the Emergency Department and triage nurse.  Should you have questions after your visit or need to cancel or reschedule your appointment, please contact [facility]  Dept: [phone]  and follow the prompts.  Office hours are 8:00 a.m. to 4:30 p.m. Monday - Friday. Please note that voicemails left after 4:00 p.m. may not be returned until the following business day.  We are closed weekends and major holidays. You have access to a nurse at all times for urgent questions. Please call the main number to the clinic Dept: [phone] and follow the prompts.   For any non-urgent questions, you may also contact your provider using MyChart. We now offer e-Visits for anyone 45 and older to request care online for non-urgent symptoms. For details visit [email or web address].   Also download the MyChart app! Go to the app store, search "MyChart", open the app, select [facility], and log in with your MyChart username and password.  Masks are optional in the cancer centers. If you would like for your care team to wear a mask while they are taking care of you, please let them know. You may have one support Jawanna Dykman who is at least 82 years old accompany you for your appointments.

## 2022-06-04 NOTE — Progress Notes (Signed)
[place] OFFICE PROGRESS NOTE  Patient Care Team: Burnard Bunting, [name]  ASSESSMENT & PLAN:  Endometrial cancer [facility]) Her last imaging study from April 2023 showed no evidence of disease She has no signs of cancer recurrence after all these years Plan CT imaging once a year only She will continue trastuzumab indefinitely Her next echocardiogram will be due in December; her recent echocardiogram in September was within normal limits   CKD (chronic kidney disease), stage III ([place]) Renal function is stable. Monitor closely  Essential hypertension Her blood pressure is elevated Her recent echocardiogram showed diastolic dysfunction We discussed importance of lifestyle changes   Orders Placed This Encounter  Procedures   ECHOCARDIOGRAM COMPLETE    Standing Status:   Future    Standing Expiration Date:   06/05/2023    Order Specific Question:   Where should this test be performed    Answer:   [place]    Order Specific Question:   Perflutren DEFINITY (image enhancing agent) should be administered unless hypersensitivity or allergy exist    Answer:   Administer Perflutren    Order Specific Question:   Reason for exam-Echo    Answer:   Chemo  [id number]    All questions were answered. The patient knows to call the clinic with any problems, questions or concerns. The total time spent in the appointment was 25 minutes encounter with patients including review of chart and various tests results, discussions about plan of care and coordination of care plan   Vicki Lark, MD 06/04/2022 1:35 PM  INTERVAL HISTORY: Please see below for problem oriented charting. she returns for treatment follow-up on maintenance trastuzumab She is doing well No new side effects of treatment Denies new lymphadenopathy Her blood pressure is mildly elevated today but usually within normal range at home  REVIEW OF SYSTEMS:   Constitutional: Denies fevers,  chills or abnormal weight loss Eyes: Denies blurriness of vision Ears, nose, mouth, throat, and face: Denies mucositis or sore throat Respiratory: Denies cough, dyspnea or wheezes Cardiovascular: Denies palpitation, chest discomfort or lower extremity swelling Gastrointestinal:  Denies nausea, heartburn or change in bowel habits Skin: Denies abnormal skin rashes Lymphatics: Denies new lymphadenopathy or easy bruising Neurological:Denies numbness, tingling or new weaknesses Behavioral/Psych: Mood is stable, no new changes  All other systems were reviewed with the patient and are negative.  I have reviewed the past medical history, past surgical history, social history and family history with the patient and they are unchanged from previous note.  ALLERGIES:  has No Known Allergies.  MEDICATIONS:  Current Outpatient Medications  Medication Sig Dispense Refill   lidocaine-prilocaine (EMLA) cream Apply 1 Application topically as needed. 30 g 3   acetaminophen (TYLENOL) 500 MG tablet Take 1,000 mg by mouth every 6 (six) hours as needed.     aspirin EC 81 MG tablet Take 81 mg by mouth daily.     atorvastatin (LIPITOR) 20 MG tablet Take 20 mg by mouth every evening.      Calcium Carbonate-Vitamin D (CALCIUM-VITAMIN D) 500-200 MG-UNIT per tablet Take 1 tablet by mouth daily.     estradiol (ESTRACE) 0.1 MG/GM vaginal cream PLACE 1 APPLICATORFUL VAGINALLY 3 TIMES A WEEK 42.5 g 12   levothyroxine (SYNTHROID, LEVOTHROID) 100 MCG tablet Take 100 mcg by mouth daily before breakfast.      losartan-hydrochlorothiazide (HYZAAR) 100-25 MG tablet  (Patient not taking: Reported on 01/23/2022)     metoprolol succinate (TOPROL-XL)  25 MG 24 hr tablet Take 25 mg by mouth daily.     Multiple Vitamin (MULTIVITAMIN WITH MINERALS) TABS Take 1 tablet by mouth daily.     valACYclovir (VALTREX) 1000 MG tablet valacyclovir 1 gram tablet     No current facility-administered medications for this visit.    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 Her cancer was treated with surgery but no radiation or chemotherapy.   Serous endometrial cancer, MSI stable ER positive, PR neg, Her2/neu 3+      Endometrial cancer ([place])  08/29/2016 Pathology Results   Endometrium, biopsy - HIGH GRADE ENDOMETRIAL CARCINOMA, SEE COMMENT. Microscopic Comment The sections show multiple fragments of adenocarcinoma displaying glandular and papillary patterns associated with high grade cytomorphology characterized by nuclear pleomorphism, prominent nucleoli and brisk mitosis. Immunohistochemical stains show that the tumor cells are positive for vimentin, p16, p53 with increased Ki-67 expression. Estrogen and progesterone receptor stains show patchy weak positivity. No significant positivity is seen with CEA. The findings are consistent with high grade endometrial carcinoma and the overall morphology and phenotypic features favor serous carcinoma.   08/29/2016 Initial Diagnosis   She presented with postmenopausal bleeding   10/03/2016 Imaging   CT C/A/P 09/2016 IMPRESSION: 1. Thickening of the endometrial canal up to 19 mm in fundus, presumably corresponding to the patient's reported endometrial carcinoma. 2. Multiple tiny pulmonary nodules scattered throughout the lungs bilaterally measuring 4 mm or less in size. Nodules of this size are typically considered statistically likely benign. In the setting of known primary malignancy, metastatic disease to the lungs is not excluded, but is not strongly favored on today's  examination. Attention on followup studies is recommended to ensure the stability or resolution of these nodules. 3. Subcentimeter low-attenuation lesion in the central aspect of segment 8 of the liver is too small to characterize. This is statistically likely a tiny cyst, but warrants attention on follow-up studies to exclude the possibility of a solitary hepatic metastasis. 4. 1.5 x 1.5 x 1.7 cm well-circumscribed lesion in the proximal stomach. This is of uncertain etiology and significance, and could represent a benign gastric polyp, however, further evaluation with nonemergent endoscopy is suggested in the near future for further evaluation. 5. **An incidental finding of potential clinical significance has been found. 1.1 x 1.6 cm thyroid nodule in the inferior aspect of the right lobe of the thyroid gland. Follow-up evaluation with nonemergent thyroid ultrasound is recommended in the near future to better evaluate this finding. This recommendation follows ACR consensuss guidelines: Managing Incidental Thyroid Nodules Detected on Imaging: White Paper of the ACR Incidental Thyroid Findings Committee. J Am Coll Radiol 2015;12(2):143-150.** 6. Aortic atherosclerosis, in addition to left anterior descending coronary artery disease   10/22/2016 Surgery   Robotic assisted total hysterectomy, BSO and bilateral pelvic lymphadenectomy  Final pathology revealed a 3cm polyp containing serous carcinoma but with no myometrial invasion, no LVSI and negative nodes.  Stage IA Uterine serous cancer   10/22/2016 Pathology Results   1. Lymph nodes, regional resection, right pelvic - SIX BENIGN LYMPH NODES (0/6). 2.  Lymph nodes, regional resection, left pelvic - SEVEN BENIGN LYMPH NODES (0/7). 3. Uterus +/- tubes/ovaries, neoplastic, with right ovary and fallopian tube ENDOMYOMETRIUM - SEROUS CARCINOMA ARISING WITHIN AN ENDOMETRIAL POLYP - NO MYOMETRIAL INVASION IDENTIFIED - ADENOMYOSIS - LEIOMYOMA (1 CM) -  SEE ONCOLOGY TABLE AND COMMENT CERVIX - CARCINOMA FOCALLY INVOLVES ENDOCERVICAL GLANDS - NABOTHIAN CYSTS RIGHT ADNEXA - BENIGN OVARY AND FALLOPIAN TUBE - NO CARCINOMA IDENTIFIED 4. Cul-de-sac biopsy - MESOTHELIAL HYPERPLASIA Microscopic Comment 3. ONCOLOGY TABLE-UTERUS, CARCINOMA OR CARCINOSARCOMA Specimen: Uterus, right fallopian tube and ovary Procedure: Total hysterectomy and right salpingo-oophorectomy Lymph node sampling performed: Bilateral pelvic regional resection Specimen integrity: Intact Maximum tumor size: 3 cm (polyp) Histologic type: Serous carcinoma Grade: High grade Myometrial invasion: Not identified Cervical stromal involvement: No, focal endocervical gland involvement Extent of involvement of other organs: Not identified Lymph - vascular invasion: Not identified Peritoneal washings: N/A Lymph nodes: Examined: 0 Sentinel 13 Non-sentinel 13 Total Lymph nodes with metastasis: 0 Isolated tumor cells (< 0.2 mm): 0 Micrometastasis: (> 0.2 mm and < 2.0 mm): 0 Macrometastasis: (> 2.0 mm): 0 Extracapsular extension: N/A Pelvic lymph nodes: 0 involved of 13 lymph nodes. Para-aortic lymph nodes: No para-aortic nodes submitted TNM code: pT1a, pNX FIGO Stage (based on pathologic findings, needs clinical correlation): IA Comment: Immunohistochemistry for cytokeratin AE1/AE3 is performed on all of the lymph nodes (parts 1 & 2) and no metastatic carcinoma is identified.   07/04/2017 Genetic Testing   The patient had genetic testing due to a personal history of breast and uterine cancer, and a family history of stomach cancer.  The Multi-Cancer Panel was ordered. The Multi-Cancer Panel offered by [facility] includes sequencing and/or deletion duplication testing of the following 83 genes: ALK, APC, ATM, AXIN2,BAP1,  BARD1, BLM, BMPR1A, BRCA1, BRCA2, BRIP1, CASR, CDC73, CDH1, CDK4, CDKN1B, CDKN1C, CDKN2A (p14ARF), CDKN2A (p16INK4a), CEBPA, CHEK2, CTNNA1, DICER1, DIS3L2, EGFR  (c.2369C>T, p.Thr790Met variant only), EPCAM (Deletion/duplication testing only), FH, FLCN, GATA2, GPC3, GREM1 (Promoter region deletion/duplication testing only), HOXB13 (c.251G>A, p.Gly84Glu), HRAS, KIT, MAX, MEN1, MET, MITF (c.952G>A, p.Glu318Lys variant only), MLH1, MSH2, MSH3, MSH6, MUTYH, NBN, NF1, NF2, NTHL1, PALB2, PDGFRA, PHOX2B, PMS2, POLD1, POLE, POT1, PRKAR1A, PTCH1, PTEN, RAD50, RAD51C, RAD51D, RB1, RECQL4, RET, RUNX1, SDHAF2, SDHA (sequence changes only), SDHB, SDHC, SDHD, SMAD4, SMARCA4, SMARCB1, SMARCE1, STK11, SUFU, TERC, TERT, TMEM127, TP53, TSC1, TSC2, VHL, WRN and WT1.   Results: No pathogenic variants were identified.  A variant of uncertain significance in the gene APC was identified.  c.791A>G (p.Gln264Arg).  The date of this test report is 07/04/2017.     Genetic Testing   Patient has genetic testing done for MSI. 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    12/03/2017 Imaging   CT Chest/Abd/Pelvis to follow pulmonary nodule and gastric mass IMPRESSION: 1. Stable CT of the chest. Small pulmonary nodules are unchanged when compared with previous exam. 2. No new findings identified. 3. Subcentimeter low-attenuation lesions within the liver are remain too small to characterize but are stable from prior exam. 4. Persistent indeterminate low-attenuation structure within the proximal stomach is unchanged measuring 1.4 cm. Correlation with direct visualization is advised   06/30/2018 Imaging   CT CHEST Lungs/Pleura: Stable scattered sub-cm pulmonary nodules are again seen bilaterally and are stable compared to previous studies. No new or enlarging pulmonary nodules or masses identified. No evidence of pulmonary infiltrate or pleural effusion.   08/19/2018 Echocardiogram   [place] is done in [place]: EF 60-65%. Mild impaired relaxation. (report scanned)  09/17/2018 Relapse/Recurrence   Presented with c/o vagina discharge.  Lesion noted at the left  vaginal apex 73m lesion removed Path c/w high grade serous cancer   09/17/2018 Pathology Results   Vagina, biopsy, left apex - HIGH GRADE SEROUS CARCINOMA.   09/28/2018 PET scan   1. Two hypermetabolic axial lymph nodes. Unusual site for metastatic endometrial carcinoma however the activity is more intense than typically seen in reactive adenopathy. Suggest ultrasound-guided percutaneous biopsy of the larger RIGHT axial lymph node 2. No evidence of local recurrence at the vaginal cuff.  3. No metastatic adenopathy in the abdomen or pelvis. 4. Stable small pulmonary nodules   10/09/2018 Pathology Results   Lymph node, needle/core biopsy, right axilla - METASTATIC CARCINOMA, SEE COMMENT. Microscopic Comment The carcinoma appears high grade. Immunohistochemistry is positive for cytokeratin 7, PAX-8, and ER. Cytokeratin 20, CDX-2, PR, and GATA-3 are negative. The findings along with the history are consistent with a gynecologic primary.    10/09/2018 Procedure   Ultrasound-guided core biopsies of a right axillary lymph node.   10/14/2018 Cancer Staging   Staging form: Corpus Uteri - Carcinoma and Carcinosarcoma, AJCC 8th Edition - Clinical: Stage IVB (cT1, cN0, pM1) - Signed by GHeath Lark MD on 10/14/2018   10/19/2018 Imaging   Placement of single lumen port a cath via right internal jugular vein. The catheter tip lies at the cavo-atrial junction. A power injectable port a cath was placed and is ready for immediate use.    10/21/2018 -  Chemotherapy   The patient had carboplatin and taxol for treatment   11/11/2018 - 03/12/2022 Chemotherapy   Patient is on Treatment Plan : BREAST Trastuzumab q21d     12/21/2018 PET scan   1. Interval resolution of hypermetabolic right axillary lymph nodes. No metabolic findings highly suspicious for recurrent metastatic disease. 2. New mild hypermetabolism within a borderline prominent portacaval lymph node, nonspecific. While a reactive node is favored, a lymph  node metastasis cannot be entirely excluded. Suggest attention to this lymph node on follow-up PET-CT in 3-6 months. 3. Nonspecific new hypermetabolism at the ileocecal valve, more likely physiologic given absence of CT correlate. 4. Scattered subcentimeter pulmonary nodules are all stable and below PET resolution, more likely benign, continued CT surveillance advised. 5. Chronic findings include: Aortic Atherosclerosis (ICD10-I70.0). Marked diffuse colonic diverticulosis. Coronary atherosclerosis.     12/28/2018 Echocardiogram   1. The left ventricle has normal systolic function with an ejection fraction of 60-65%. The cavity size was normal. Left ventricular diastolic Doppler parameters are consistent with impaired relaxation.  2. GLS recorded as -10.7 but LV appears hyperdynamic and tracking of endocardium appears poor.  3. The right ventricle has normal systolic function. The cavity was normal. There is no increase in right ventricular wall thickness.  4. Mild thickening of the mitral valve leaflet.  5. The aortic valve was not well visualized. Mild thickening of the aortic valve. Aortic valve regurgitation is trivial by color flow Doppler.   03/23/2019 Imaging   PET 1. No findings of hypermetabolic residual/recurrent or metastatic disease. 2. Similar low-level hypermetabolism within normal sized portocaval and right inguinal nodes, favored to be reactive. 3. Ongoing stability of small bilateral pulmonary nodules, favored to be benign. Below PET resolution. 4. Coronary artery atherosclerosis. Aortic Atherosclerosis   03/26/2019 Echocardiogram   1. The left ventricle has normal systolic function, with an ejection fraction of 55-60%. The cavity size was normal. Left ventricular diastolic Doppler parameters are consistent with impaired relaxation.  2. The  right ventricle has normal systolic function. The cavity was normal.  3. The mitral valve is abnormal. Mild thickening of the mitral valve  leaflet. There is mild mitral annular calcification present.  4. The tricuspid valve is grossly normal.  5. The aortic valve is tricuspid. Mild calcification of the aortic valve. No stenosis of the aortic valve.  6. The aorta is normal unless otherwise noted.  7. Normal LV systolic function; grade 1 diastolic dysfunction; [facility]-94.7%.   06/30/2019 Imaging   Chest Impression:   1. No evidence thoracic metastasis. 2. Stable small benign-appearing pulmonary nodules   Abdomen / Pelvis Impression:   1. No evidence of metastatic disease in the abdomen pelvis. 2.  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 3.  Aortic Atherosclerosis (ICD10-I70.0).   06/30/2019 Echocardiogram    1. Left ventricular ejection fraction, by visual estimation, is 60 to 65%. The left ventricle has normal function. There is no left ventricular hypertrophy.  2. Left ventricular diastolic parameters are consistent with Grade I diastolic dysfunction (impaired relaxation).  3. The left ventricle has no regional wall motion abnormalities.  4. Global right ventricle has normal systolic function.The right ventricular size is normal. No increase in right ventricular wall thickness.  5. Left atrial size was mild-moderately dilated.  6. Right atrial size was normal.  7. The mitral valve is normal in structure. Trivial mitral valve regurgitation.  8. The tricuspid valve is normal in structure. Tricuspid valve regurgitation is trivial.  9. The aortic valve is normal in structure. Aortic valve regurgitation is trivial. No evidence of aortic valve sclerosis or stenosis. 10. The pulmonic valve was grossly normal. Pulmonic valve regurgitation is not visualized. 11. Mildly elevated pulmonary artery systolic pressure. 12. The inferior vena cava is normal in size with greater than 50% respiratory variability, suggesting right atrial pressure of 3 mmHg. 13. The average left ventricular global longitudinal strain is -12.5 %.  GLS underestimated due to poor endocardial tracking.     09/27/2019 Imaging   1. Multiple small, nonspecific pulmonary nodules are again noted. The previously noted lung nodules are unchanged in size from previous exam. There is a new lung nodule within the medial right lower lobe which is nonspecific measure 4 mm. Attention at follow-up imaging is recommended. 2. No evidence of metastatic disease within the abdomen or pelvis. 3. Coronary artery calcifications.  The 4.  Aortic Atherosclerosis (ICD10-I70.0).   09/30/2019 Echocardiogram    1. Left ventricular ejection fraction, by estimation, is 60 to 65%. The left ventricle has normal function. The left ventricle has no regional wall motion abnormalities. Left ventricular diastolic parameters are consistent with Grade I diastolic dysfunction (impaired relaxation).  2. Right ventricular systolic function is normal. The right ventricular size is normal. There is normal pulmonary artery systolic pressure. The estimated right ventricular systolic pressure is 09.6 mmHg.  3. The mitral valve is normal in structure. No evidence of mitral valve regurgitation. No evidence of mitral stenosis.  4. The aortic valve is normal in structure. Aortic valve regurgitation is trivial. No aortic stenosis is present.  5. The inferior vena cava is normal in size with greater than 50% respiratory variability, suggesting right atrial pressure of 3 mmHg.     01/07/2020 Echocardiogram    1. Normal LV function; grade 1 diastolic dysfunction; mild AI; GLS -21%.  2. Left ventricular ejection fraction, by estimation, is 55 to 60%. The left ventricle has normal function. The left ventricle has no regional wall motion abnormalities. Left ventricular diastolic parameters  are consistent with Grade I diastolic dysfunction (impaired relaxation).  3. Right ventricular systolic function is normal. The right ventricular size is normal.  4. The mitral valve is normal in structure. Trivial  mitral valve regurgitation. No evidence of mitral stenosis.  5. The aortic valve is tricuspid. Aortic valve regurgitation is mild. Mild aortic valve sclerosis is present, with no evidence of aortic valve stenosis.  6. The inferior vena cava is normal in size with greater than 50% respiratory variability, suggesting right atrial pressure of 3 mmHg.     03/27/2020 Imaging   1. Status post hysterectomy. No evidence of recurrent or metastatic disease in the abdomen or pelvis. 2. Multiple small pulmonary nodules in the lung bases are unchanged to the extent that they are included on current examination and remain most likely sequelae of prior infection or inflammation. Attention on follow-up. 3. Aortic Atherosclerosis (ICD10-I70.0).   04/17/2020 Echocardiogram   1. Normal LVEF, normal and unchanged GLS: -20.2%.  2. Left ventricular ejection fraction, by estimation, is 60 to 65%. The left ventricle has normal function. The left ventricle has no regional wall motion abnormalities. There is mild concentric left ventricular hypertrophy. Left ventricular diastolic parameters are consistent with Grade I diastolic dysfunction (impaired relaxation). The average left ventricular global longitudinal strain is -20.2 %. The global longitudinal strain is normal.  3. Right ventricular systolic function is normal. The right ventricular size is normal.  4. The mitral valve is normal in structure. Mild mitral valve regurgitation. No evidence of mitral stenosis.  5. The aortic valve is normal in structure. There is mild calcification of the aortic valve. There is mild thickening of the aortic valve. Aortic valve regurgitation is mild. No aortic stenosis is present.  6. The inferior vena cava is normal in size with greater than 50% respiratory variability, suggesting right atrial pressure of 3 mmHg.   08/02/2020 Echocardiogram   1. Left ventricular ejection fraction, by estimation, is 55 to 60%. The left ventricle has  normal function. The left ventricle has no regional wall motion abnormalities. Left ventricular diastolic parameters are consistent with Grade I diastolic dysfunction (impaired relaxation). The average left ventricular global longitudinal strain is -27.0 %. The global longitudinal strain is normal.  2. Right ventricular systolic function is normal. The right ventricular size is normal. Tricuspid regurgitation signal is inadequate for assessing PA pressure.  3. The mitral valve is grossly normal. Trivial mitral valve regurgitation. No evidence of mitral stenosis.  4. The aortic valve is tricuspid. Aortic valve regurgitation is mild. No aortic stenosis is present.  5. The inferior vena cava is normal in size with greater than 50% respiratory variability, suggesting right atrial pressure of 3 mmHg.   09/19/2020 Imaging   1. Status post hysterectomy. No evidence of recurrent or new metastatic disease within the chest, abdomen, or pelvis. 2. Scattered bilateral tiny pulmonary nodules, unchanged from prior studies and most likely sequela of prior infection or inflammation, recommend attention on follow-up imaging. No new suspicious pulmonary nodules or masses. 3. Subcutaneous edema and small hematoma overlying the posterior gluteal musculature, recommend correlation with recent history of trauma. 4. Mild low-density wall thickening of the gastric antrum, which may represent gastritis. 5. Hepatic steatosis. 6. Extensive sigmoid colonic diverticulosis without findings of acute diverticulitis. 7. Aortic atherosclerosis.  Aortic Atherosclerosis (ICD10-I70.0).   11/20/2020 Echocardiogram    1. Compared to echo from Jan 2022, Global longitudinal strain is less negative. (Previously -27%). Left ventricular ejection fraction, by estimation, is 60 to 65%. The  left ventricle has normal function. The left ventricle has no regional wall motion abnormalities. There is mild left ventricular hypertrophy. Left ventricular  diastolic parameters are indeterminate.  2. Right ventricular systolic function is normal. The right ventricular size is normal.  3. The mitral valve is normal in structure. Trivial mitral valve regurgitation.  4. Aortic valve regurgitation is mild. Mild aortic valve sclerosis is present, with no evidence of aortic valve stenosis.     03/12/2021 Echocardiogram   1. Left ventricular ejection fraction, by estimation, is 60 to 65%. Left ventricular ejection fraction by 2D MOD biplane is 63.6 %. The left ventricle has normal function. The left ventricle has no regional wall motion abnormalities. There is mild left ventricular hypertrophy. Left ventricular diastolic parameters are consistent with Grade I diastolic dysfunction (impaired relaxation). The average left ventricular global longitudinal strain is -20.0 %. The global longitudinal strain is normal.  2. Right ventricular systolic function is normal. The right ventricular size is normal. There is normal pulmonary artery systolic pressure. The estimated right ventricular systolic pressure is 69.6 mmHg.  3. The mitral valve is normal in structure. No evidence of mitral valve regurgitation. No evidence of mitral stenosis.  4. The aortic valve is tricuspid. Aortic valve regurgitation is trivial. No aortic stenosis is present. Aortic regurgitation PHT measures 403 msec.  5. The inferior vena cava is normal in size with greater than 50% respiratory variability, suggesting right atrial pressure of 3 mmHg.   04/02/2021 Imaging   Stable small pulmonary nodules scattered throughout the chest, unchanged, without new or suspicious pulmonary nodule.   Three-vessel coronary artery disease.   Aortic Atherosclerosis (ICD10-I70.0).   06/06/2021 Imaging    1. Left ventricular ejection fraction, by estimation, is 60 to 65%. The left ventricle has normal function. The left ventricle has no regional wall motion abnormalities. Left ventricular diastolic parameters are  consistent with Grade I diastolic dysfunction (impaired relaxation).  2. Right ventricular systolic function is normal. The right ventricular size is normal. There is normal pulmonary artery systolic pressure. The estimated right ventricular systolic pressure is 78.9 mmHg.  3. Left atrial size was mildly dilated.  4. The mitral valve is normal in structure. Trivial mitral valve regurgitation.  5. The aortic valve is tricuspid. Aortic valve regurgitation is mild. No aortic stenosis is present.  6. The inferior vena cava is normal in size with greater than 50% respiratory variability, suggesting right atrial pressure of 3 mmHg.  7. GLS attempted but not reported due to suboptimal tracking.     09/25/2021 Echocardiogram    1. Left ventricular ejection fraction, by estimation, is 60 to 65%. Left ventricular ejection fraction by 3D volume is 56 %. The left ventricle has normal function. The left ventricle has no regional wall motion abnormalities. Left ventricular diastolic parameters were normal.  2. Right ventricular systolic function is normal. The right ventricular size is normal. There is normal pulmonary artery systolic pressure.  3. The mitral valve is normal in structure. Trivial mitral valve regurgitation. No evidence of mitral stenosis.  4. The aortic valve is tricuspid. Aortic valve regurgitation is mild. No aortic stenosis is present.  5. The inferior vena cava is normal in size with greater than 50% respiratory variability, suggesting right atrial pressure of 3 mmHg.   10/15/2021 Imaging   1. Stable examination post prior hysterectomy without evidence of local recurrence or metastatic disease within the chest, abdomen, or pelvis. 2. Unchanged small bilateral pulmonary nodules, likely benign. 3. Left-sided colonic diverticulosis without  findings of acute diverticulitis. 4. Aortic Atherosclerosis (ICD10-I70.0).       12/31/2021 Echocardiogram    1. Left ventricular ejection fraction, by  estimation, is 65 to 70%. The left ventricle has normal function. The left ventricle has no regional wall motion abnormalities. Left ventricular diastolic parameters are consistent with Grade I diastolic dysfunction (impaired relaxation).  2. Right ventricular systolic function is normal. The right ventricular size is normal. There is normal pulmonary artery systolic pressure. The estimated right ventricular systolic pressure is 24.0 mmHg.  3. The mitral valve is grossly normal. Trivial mitral valve regurgitation. No evidence of mitral stenosis.  4. The aortic valve is tricuspid. Aortic valve regurgitation is mild. No aortic stenosis is present.  5. The inferior vena cava is normal in size with greater than 50% respiratory variability, suggesting right atrial pressure of 3 mmHg.     04/02/2022 -  Chemotherapy   Patient is on Treatment Plan : BREAST MAINTENANCE Trastuzumab IV (6) or SQ (600) D1 q21d X 11 Cycles     04/04/2022 Echocardiogram    1. Left ventricular ejection fraction, by estimation, is 65 to 70%. The left ventricle has normal function. The left ventricle has no regional wall motion abnormalities. Left ventricular diastolic parameters are consistent with Grade I diastolic dysfunction (impaired relaxation).  2. Right ventricular systolic function is normal. The right ventricular size is normal.  3. Left atrial size was mildly dilated.  4. The mitral valve is normal in structure. Trivial mitral valve regurgitation. No evidence of mitral stenosis.  5. The aortic valve is tricuspid. There is mild calcification of the aortic valve. Aortic valve regurgitation is mild. No aortic stenosis is present. Aortic regurgitation PHT measures 613 msec. Aortic valve area, by VTI measures 1.70 cm. Aortic valve mean gradient measures 3.0 mmHg. Aortic valve Vmax measures 1.15 m/s.  6. The inferior vena cava is normal in size with greater than 50% respiratory variability, suggesting right atrial pressure of 3  mmHg.   Metastasis to lymph nodes ([place])  10/14/2018 Initial Diagnosis   Metastasis to lymph nodes ([place])   10/21/2018 - 02/24/2019 Chemotherapy   The patient had palonosetron (ALOXI) injection 0.25 mg, 0.25 mg, Intravenous,  Once, 7 of 7 cycles Administration: 0.25 mg (10/21/2018), 0.25 mg (11/11/2018), 0.25 mg (12/02/2018), 0.25 mg (12/23/2018), 0.25 mg (01/13/2019), 0.25 mg (02/03/2019), 0.25 mg (02/24/2019) CARBOplatin (PARAPLATIN) 310 mg in sodium chloride 0.9 % 250 mL chemo infusion, 310 mg, Intravenous,  Once, 7 of 7 cycles Dose modification: 300 mg (original dose 311.5 mg, Cycle 4, Reason: Dose not tolerated) Administration: 310 mg (10/21/2018), 300 mg (11/11/2018), 300 mg (12/02/2018), 300 mg (12/23/2018), 300 mg (01/13/2019), 300 mg (02/03/2019), 300 mg (02/24/2019) PACLitaxel (TAXOL) 222 mg in sodium chloride 0.9 % 250 mL chemo infusion (> 32m/m2), 135 mg/m2 = 222 mg, Intravenous,  Once, 7 of 7 cycles Administration: 222 mg (10/21/2018), 222 mg (11/11/2018), 222 mg (12/02/2018), 222 mg (12/23/2018), 222 mg (01/13/2019), 222 mg (02/03/2019), 222 mg (02/24/2019) fosaprepitant (EMEND) 150 mg, dexamethasone (DECADRON) 12 mg in sodium chloride 0.9 % 145 mL IVPB, , Intravenous,  Once, 7 of 7 cycles Administration:  (10/21/2018),  (11/11/2018),  (12/02/2018),  (12/23/2018),  (01/13/2019),  (02/03/2019),  (02/24/2019)  for chemotherapy treatment.    04/02/2022 -  Chemotherapy   Patient is on Treatment Plan : BREAST MAINTENANCE Trastuzumab IV (6) or SQ (600) D1 q21d X 11 Cycles       PHYSICAL EXAMINATION: ECOG PERFORMANCE STATUS: 0 - Asymptomatic  Vitals:  06/04/22 0908  BP: (!) 155/62  Pulse: 73  Resp: 18  Temp: 97.7 F (36.5 C)  SpO2: 97%   Filed Weights   06/04/22 0908  Weight: 135 lb 12.8 oz (61.6 kg)    GENERAL:alert, no distress and comfortable SKIN: skin color, texture, turgor are normal, no rashes or significant lesions EYES: normal, Conjunctiva are pink and non-injected, sclera clear OROPHARYNX:no  exudate, no erythema and lips, buccal mucosa, and tongue normal  NECK: supple, thyroid normal size, non-tender, without nodularity LYMPH:  no palpable lymphadenopathy in the cervical, axillary or inguinal LUNGS: clear to auscultation and percussion with normal breathing effort HEART: regular rate & rhythm and no murmurs and no lower extremity edema ABDOMEN:abdomen soft, non-tender and normal bowel sounds Musculoskeletal:no cyanosis of digits and no clubbing  NEURO: alert & oriented x 3 with fluent speech, no focal motor/sensory deficits  LABORATORY DATA:  I have reviewed the data as listed    Component Value Date/Time   NA 138 06/04/2022 0854   NA 139 02/13/2017 1512   K 3.4 (L) 06/04/2022 0854   K 4.2 02/13/2017 1512   CL 103 06/04/2022 0854   CO2 27 06/04/2022 0854   CO2 28 02/13/2017 1512   GLUCOSE 96 06/04/2022 0854   GLUCOSE 118 02/13/2017 1512   BUN 25 (H) 06/04/2022 0854   BUN 25.2 02/13/2017 1512   CREATININE 1.26 (H) 06/04/2022 0854   CREATININE 1.2 (H) 02/13/2017 1512   CALCIUM 9.5 06/04/2022 0854   CALCIUM 10.0 02/13/2017 1512   PROT 6.8 03/12/2022 1000   ALBUMIN 4.4 03/12/2022 1000   AST 23 03/12/2022 1000   AST 19 12/21/2019 0850   ALT 13 03/12/2022 1000   ALT 13 12/21/2019 0850   ALKPHOS 51 03/12/2022 1000   BILITOT 0.4 03/12/2022 1000   BILITOT 0.4 12/21/2019 0850   GFRNONAA 43 (L) 06/04/2022 0854   GFRAA 47 (L) 03/28/2020 0933   GFRAA 42 (L) 12/21/2019 0850    No results found for: "SPEP", "UPEP"  Lab Results  Component Value Date   WBC 6.2 06/04/2022   NEUTROABS 3.2 06/04/2022   HGB 10.7 (L) 06/04/2022   HCT 30.1 (L) 06/04/2022   MCV 89.9 06/04/2022   PLT 229 06/04/2022      Chemistry      Component Value Date/Time   NA 138 06/04/2022 0854   NA 139 02/13/2017 1512   K 3.4 (L) 06/04/2022 0854   K 4.2 02/13/2017 1512   CL 103 06/04/2022 0854   CO2 27 06/04/2022 0854   CO2 28 02/13/2017 1512   BUN 25 (H) 06/04/2022 0854   BUN 25.2  02/13/2017 1512   CREATININE 1.26 (H) 06/04/2022 0854   CREATININE 1.2 (H) 02/13/2017 1512      Component Value Date/Time   CALCIUM 9.5 06/04/2022 0854   CALCIUM 10.0 02/13/2017 1512   ALKPHOS 51 03/12/2022 1000   AST 23 03/12/2022 1000   AST 19 12/21/2019 0850   ALT 13 03/12/2022 1000   ALT 13 12/21/2019 0850   BILITOT 0.4 03/12/2022 1000   BILITOT 0.4 12/21/2019 0850

## 2022-06-04 NOTE — Assessment & Plan Note (Signed)
Her blood pressure is elevated Her recent echocardiogram showed diastolic dysfunction We discussed importance of lifestyle changes

## 2022-06-05 ENCOUNTER — Other Ambulatory Visit: Payer: Self-pay

## 2022-06-12 DIAGNOSIS — D485 Neoplasm of uncertain behavior of skin: Secondary | ICD-10-CM | POA: Diagnosis not present

## 2022-06-12 DIAGNOSIS — L570 Actinic keratosis: Secondary | ICD-10-CM | POA: Diagnosis not present

## 2022-06-12 DIAGNOSIS — C44629 Squamous cell carcinoma of skin of left upper limb, including shoulder: Secondary | ICD-10-CM | POA: Diagnosis not present

## 2022-06-14 ENCOUNTER — Telehealth: Payer: Self-pay | Admitting: *Deleted

## 2022-06-14 NOTE — Telephone Encounter (Signed)
Moved the patient's appt from 12/8 to 1/11

## 2022-06-14 NOTE — Telephone Encounter (Signed)
Patient called back and moved her appt to 2/16

## 2022-06-15 ENCOUNTER — Other Ambulatory Visit: Payer: Self-pay

## 2022-06-19 DIAGNOSIS — R5383 Other fatigue: Secondary | ICD-10-CM | POA: Diagnosis not present

## 2022-06-19 DIAGNOSIS — R0981 Nasal congestion: Secondary | ICD-10-CM | POA: Diagnosis not present

## 2022-06-19 DIAGNOSIS — U071 COVID-19: Secondary | ICD-10-CM | POA: Diagnosis not present

## 2022-06-19 DIAGNOSIS — Z1152 Encounter for screening for COVID-19: Secondary | ICD-10-CM | POA: Diagnosis not present

## 2022-06-19 DIAGNOSIS — R051 Acute cough: Secondary | ICD-10-CM | POA: Diagnosis not present

## 2022-06-20 ENCOUNTER — Other Ambulatory Visit: Payer: Self-pay | Admitting: Hematology and Oncology

## 2022-06-20 ENCOUNTER — Telehealth: Payer: Self-pay

## 2022-06-20 NOTE — Telephone Encounter (Signed)
Pt called reporting she is covid positive as of 12/6 with fever, cough, and achiness. Pt was laready prescribed an antiviral. Cancelled 12/12 infusion appt but 12/18 ECHO still ok per ECHO office staff.   Per Pt she will be out of town from 12/19 to Jan 3rd. Pt asking if Dr. Alvy Bimler is ok with Pt going on trip and keeping the 1/4 appt.

## 2022-06-20 NOTE — Telephone Encounter (Signed)
Called and given Dr. Alvy Bimler below message. Pt verbalized understanding and appreciated the call.

## 2022-06-20 NOTE — Telephone Encounter (Signed)
Agree to keep echo She can go out of town I canceled her chemo infusion on 12/12 and keep the rest of her appt as scheduled

## 2022-06-21 ENCOUNTER — Ambulatory Visit: Payer: Medicare HMO | Admitting: Gynecologic Oncology

## 2022-06-25 ENCOUNTER — Ambulatory Visit: Payer: Medicare HMO

## 2022-07-01 ENCOUNTER — Ambulatory Visit (HOSPITAL_COMMUNITY)
Admission: RE | Admit: 2022-07-01 | Discharge: 2022-07-01 | Disposition: A | Discharge status: Home / Self Care | Payer: Medicare HMO | Source: Ambulatory Visit | Attending: Hematology and Oncology | Admitting: Hematology and Oncology

## 2022-07-01 DIAGNOSIS — Z0189 Encounter for other specified special examinations: Secondary | ICD-10-CM

## 2022-07-01 DIAGNOSIS — I082 Rheumatic disorders of both aortic and tricuspid valves: Secondary | ICD-10-CM | POA: Insufficient documentation

## 2022-07-01 DIAGNOSIS — I10 Essential (primary) hypertension: Secondary | ICD-10-CM | POA: Diagnosis not present

## 2022-07-01 DIAGNOSIS — C541 Malignant neoplasm of endometrium: Secondary | ICD-10-CM | POA: Diagnosis not present

## 2022-07-01 DIAGNOSIS — Z9882 Breast implant status: Secondary | ICD-10-CM | POA: Diagnosis not present

## 2022-07-01 LAB — ECHOCARDIOGRAM COMPLETE
Area-P 1/2: 4.04 cm2
MV VTI: 1.68 cm2
P 1/2 time: 456 msec
S' Lateral: 2.5 cm

## 2022-07-01 NOTE — Progress Notes (Signed)
  Echocardiogram 2D Echocardiogram has been performed.  Vicki Cervantes 07/01/2022, 11:34 AM

## 2022-07-16 ENCOUNTER — Ambulatory Visit: Payer: Medicare HMO

## 2022-07-16 ENCOUNTER — Other Ambulatory Visit: Payer: Medicare HMO

## 2022-07-18 ENCOUNTER — Inpatient Hospital Stay: Payer: Medicare HMO

## 2022-07-18 ENCOUNTER — Other Ambulatory Visit: Payer: Self-pay

## 2022-07-18 ENCOUNTER — Inpatient Hospital Stay: Payer: Medicare HMO | Attending: Gynecologic Oncology

## 2022-07-18 ENCOUNTER — Inpatient Hospital Stay (HOSPITAL_BASED_OUTPATIENT_CLINIC_OR_DEPARTMENT_OTHER): Payer: Medicare HMO | Admitting: Hematology and Oncology

## 2022-07-18 ENCOUNTER — Encounter: Payer: Self-pay | Admitting: Hematology and Oncology

## 2022-07-18 ENCOUNTER — Ambulatory Visit: Payer: Medicare HMO | Admitting: Hematology and Oncology

## 2022-07-18 VITALS — BP 142/50 | HR 71 | Temp 97.8°F | Resp 18 | Ht 62.0 in | Wt 134.4 lb

## 2022-07-18 DIAGNOSIS — C541 Malignant neoplasm of endometrium: Secondary | ICD-10-CM | POA: Insufficient documentation

## 2022-07-18 DIAGNOSIS — Z9079 Acquired absence of other genital organ(s): Secondary | ICD-10-CM | POA: Insufficient documentation

## 2022-07-18 DIAGNOSIS — Z7189 Other specified counseling: Secondary | ICD-10-CM

## 2022-07-18 DIAGNOSIS — C773 Secondary and unspecified malignant neoplasm of axilla and upper limb lymph nodes: Secondary | ICD-10-CM

## 2022-07-18 DIAGNOSIS — Z5112 Encounter for antineoplastic immunotherapy: Secondary | ICD-10-CM | POA: Diagnosis present

## 2022-07-18 DIAGNOSIS — D630 Anemia in neoplastic disease: Secondary | ICD-10-CM

## 2022-07-18 DIAGNOSIS — Z90710 Acquired absence of both cervix and uterus: Secondary | ICD-10-CM | POA: Insufficient documentation

## 2022-07-18 DIAGNOSIS — Z90722 Acquired absence of ovaries, bilateral: Secondary | ICD-10-CM | POA: Insufficient documentation

## 2022-07-18 DIAGNOSIS — Z79899 Other long term (current) drug therapy: Secondary | ICD-10-CM | POA: Diagnosis not present

## 2022-07-18 DIAGNOSIS — N189 Chronic kidney disease, unspecified: Secondary | ICD-10-CM | POA: Insufficient documentation

## 2022-07-18 LAB — CBC WITH DIFFERENTIAL (CANCER CENTER ONLY)
Abs Immature Granulocytes: 0.01 10*3/uL (ref 0.00–0.07)
Basophils Absolute: 0.1 10*3/uL (ref 0.0–0.1)
Basophils Relative: 2 %
Eosinophils Absolute: 0.3 10*3/uL (ref 0.0–0.5)
Eosinophils Relative: 7 %
HCT: 30.1 % — ABNORMAL LOW (ref 36.0–46.0)
Hemoglobin: 10.6 g/dL — ABNORMAL LOW (ref 12.0–15.0)
Immature Granulocytes: 0 %
Lymphocytes Relative: 32 %
Lymphs Abs: 1.6 10*3/uL (ref 0.7–4.0)
MCH: 31.5 pg (ref 26.0–34.0)
MCHC: 35.2 g/dL (ref 30.0–36.0)
MCV: 89.6 fL (ref 80.0–100.0)
Monocytes Absolute: 0.5 10*3/uL (ref 0.1–1.0)
Monocytes Relative: 10 %
Neutro Abs: 2.5 10*3/uL (ref 1.7–7.7)
Neutrophils Relative %: 49 %
Platelet Count: 207 10*3/uL (ref 150–400)
RBC: 3.36 MIL/uL — ABNORMAL LOW (ref 3.87–5.11)
RDW: 12.1 % (ref 11.5–15.5)
WBC Count: 5.1 10*3/uL (ref 4.0–10.5)
nRBC: 0 % (ref 0.0–0.2)

## 2022-07-18 LAB — BASIC METABOLIC PANEL - CANCER CENTER ONLY
Anion gap: 10 (ref 5–15)
BUN: 23 mg/dL (ref 8–23)
CO2: 26 mmol/L (ref 22–32)
Calcium: 9.3 mg/dL (ref 8.9–10.3)
Chloride: 102 mmol/L (ref 98–111)
Creatinine: 1.29 mg/dL — ABNORMAL HIGH (ref 0.44–1.00)
GFR, Estimated: 41 mL/min — ABNORMAL LOW (ref >60)
Glucose, Bld: 92 mg/dL (ref 70–99)
Potassium: 3.5 mmol/L (ref 3.5–5.1)
Sodium: 138 mmol/L (ref 135–145)

## 2022-07-18 MED ORDER — TRASTUZUMAB-ANNS CHEMO 150 MG IV SOLR
6.0000 mg/kg | Freq: Once | INTRAVENOUS | Status: AC
  Administered 2022-07-18: 357 mg via INTRAVENOUS
  Filled 2022-07-18: qty 17

## 2022-07-18 MED ORDER — FAMOTIDINE IN NACL 20-0.9 MG/50ML-% IV SOLN
INTRAVENOUS | Status: AC
  Filled 2022-07-18: qty 50

## 2022-07-18 MED ORDER — HEPARIN SOD (PORK) LOCK FLUSH 100 UNIT/ML IV SOLN
500.0000 [IU] | Freq: Once | INTRAVENOUS | Status: AC | PRN
  Administered 2022-07-18: 500 [IU]

## 2022-07-18 MED ORDER — DIPHENHYDRAMINE HCL 25 MG PO CAPS
25.0000 mg | ORAL_CAPSULE | Freq: Once | ORAL | Status: AC
  Administered 2022-07-18: 25 mg via ORAL
  Filled 2022-07-18: qty 1

## 2022-07-18 MED ORDER — ACETAMINOPHEN 325 MG PO TABS
650.0000 mg | ORAL_TABLET | Freq: Once | ORAL | Status: AC
  Administered 2022-07-18: 650 mg via ORAL
  Filled 2022-07-18: qty 2

## 2022-07-18 MED ORDER — ACETAMINOPHEN 325 MG PO TABS
ORAL_TABLET | ORAL | Status: AC
  Filled 2022-07-18: qty 2

## 2022-07-18 MED ORDER — SODIUM CHLORIDE 0.9 % IV SOLN: Freq: Once | INTRAVENOUS | Status: AC

## 2022-07-18 MED ORDER — SODIUM CHLORIDE 0.9% FLUSH
10.0000 mL | INTRAVENOUS | Status: DC | PRN
  Administered 2022-07-18: 10 mL

## 2022-07-18 MED ORDER — SODIUM CHLORIDE 0.9% FLUSH
10.0000 mL | Freq: Once | INTRAVENOUS | Status: AC
  Administered 2022-07-18: 10 mL

## 2022-07-18 NOTE — Progress Notes (Signed)
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   All questions were answered. The patient knows to call the clinic with any problems, questions or concerns. The total time spent in the appointment was 20 minutes encounter with patients including review of chart and various tests results, discussions about plan of care and coordination of care plan   [name], MD 07/18/2022 10:12 AM  INTERVAL HISTORY: Please see below for problem oriented charting. she returns for treatment follow-up on maintenance trastuzumab She is doing well No signs or symptoms of congestive heart failure  REVIEW OF SYSTEMS:   Constitutional: Denies fevers, chills or abnormal weight loss Eyes: Denies blurriness of vision Ears, nose, mouth, throat, and face: Denies mucositis or sore throat Respiratory: Denies cough, dyspnea or wheezes Cardiovascular: Denies palpitation, chest discomfort or lower extremity swelling Gastrointestinal:  Denies nausea, heartburn or change in bowel habits Skin: Denies abnormal skin rashes Lymphatics: Denies new lymphadenopathy or easy bruising Neurological:Denies numbness, tingling or new weaknesses Behavioral/Psych: Mood is stable, no new changes   All other systems were reviewed with the patient and are negative.  I have reviewed the past medical history, past surgical history, social history and family history with the patient and they are unchanged from previous note.  ALLERGIES:  has No Known Allergies.  MEDICATIONS:  Current Outpatient Medications  Medication Sig Dispense Refill   acetaminophen (TYLENOL) 500 MG tablet Take 1,000 mg by mouth every 6 (six) hours as needed.     aspirin EC 81 MG tablet Take 81 mg by mouth daily.     atorvastatin (LIPITOR) 20 MG tablet Take 20 mg by mouth every evening.      Calcium Carbonate-Vitamin D (CALCIUM-VITAMIN D) 500-200 MG-UNIT per tablet Take 1 tablet by mouth daily.     estradiol (ESTRACE) 0.1 MG/GM vaginal cream PLACE 1 APPLICATORFUL VAGINALLY 3 TIMES A WEEK 42.5 g 12   levothyroxine (SYNTHROID, LEVOTHROID) 100 MCG tablet Take 100 mcg by mouth daily before breakfast.      lidocaine-prilocaine (EMLA) cream Apply 1 Application topically as needed. 30 g 3   losartan-hydrochlorothiazide (HYZAAR) 100-25 MG tablet  (Patient not taking: Reported on 01/23/2022)     metoprolol succinate (TOPROL-XL) 25 MG 24 hr tablet Take 25 mg by mouth daily.     Multiple Vitamin (MULTIVITAMIN WITH MINERALS) TABS Take 1 tablet by mouth daily.     valACYclovir (VALTREX) 1000 MG tablet valacyclovir 1 gram tablet     No current facility-administered medications for this visit.    SUMMARY OF ONCOLOGIC HISTORY: Oncology History Overview Note  ZINNIA TINDALL  has a remote history of left  breast cancer at age 1 but received BRCA testing approximately 5 years ago which was negative. Her cancer was treated with surgery but no radiation or chemotherapy.   Serous  endometrial cancer, MSI stable ER positive, PR neg, Her2/neu 3+      Endometrial cancer ([place])  08/29/2016 Pathology Results   Endometrium, biopsy - HIGH GRADE ENDOMETRIAL CARCINOMA, SEE COMMENT. Microscopic Comment The sections show multiple fragments  of adenocarcinoma displaying glandular and papillary patterns associated with high grade cytomorphology characterized by nuclear pleomorphism, prominent nucleoli and brisk mitosis. Immunohistochemical stains show that the tumor cells are positive for vimentin, p16, p53 with increased Ki-67 expression. Estrogen and progesterone receptor stains show patchy weak positivity. No significant positivity is seen with CEA. The findings are consistent with high grade endometrial carcinoma and the overall morphology and phenotypic features favor serous carcinoma.   08/29/2016 Initial Diagnosis   She presented with postmenopausal bleeding   10/03/2016 Imaging   CT C/A/P 09/2016 IMPRESSION: 1. Thickening of the endometrial canal up to 19 mm in fundus, presumably corresponding to the patient's reported endometrial carcinoma. 2. Multiple tiny pulmonary nodules scattered throughout the lungs bilaterally measuring 4 mm or less in size. Nodules of this size are typically considered statistically likely benign. In the setting of known primary malignancy, metastatic disease to the lungs is not excluded, but is not strongly favored on today's examination. Attention on followup studies is recommended to ensure the stability or resolution of these nodules. 3. Subcentimeter low-attenuation lesion in the central aspect of segment 8 of the liver is too small to characterize. This is statistically likely a tiny cyst, but warrants attention on follow-up studies to exclude the possibility of a solitary hepatic metastasis. 4. 1.5 x 1.5 x 1.7 cm well-circumscribed lesion in the proximal stomach. This is of uncertain etiology and significance, and could represent a benign gastric polyp, however, further evaluation with nonemergent endoscopy is suggested in the near future for further evaluation. 5. **An incidental finding of potential clinical significance has been found. 1.1 x 1.6 cm thyroid nodule in the inferior aspect of the right  lobe of the thyroid gland. Follow-up evaluation with nonemergent thyroid ultrasound is recommended in the near future to better evaluate this finding. This recommendation follows ACR consensuss guidelines: Managing Incidental Thyroid Nodules Detected on Imaging: White Paper of the ACR Incidental Thyroid Findings Committee. J Am Coll Radiol 2015;12(2):143-150.** 6. Aortic atherosclerosis, in addition to left anterior descending coronary artery disease   10/22/2016 Surgery   Robotic assisted total hysterectomy, BSO and bilateral pelvic lymphadenectomy  Final pathology revealed a 3cm polyp containing serous carcinoma but with no myometrial invasion, no LVSI and negative nodes.  Stage IA Uterine serous cancer   10/22/2016 Pathology Results   1. Lymph nodes, regional resection, right pelvic - SIX BENIGN LYMPH NODES (0/6). 2. Lymph nodes, regional resection, left pelvic - SEVEN BENIGN LYMPH NODES (0/7). 3. Uterus +/- tubes/ovaries, neoplastic, with right ovary and fallopian tube ENDOMYOMETRIUM - SEROUS CARCINOMA ARISING WITHIN AN ENDOMETRIAL POLYP - NO MYOMETRIAL INVASION IDENTIFIED - ADENOMYOSIS - LEIOMYOMA (1 CM) - SEE ONCOLOGY TABLE AND COMMENT CERVIX - CARCINOMA FOCALLY INVOLVES ENDOCERVICAL GLANDS - NABOTHIAN CYSTS RIGHT ADNEXA - BENIGN OVARY AND FALLOPIAN TUBE - NO CARCINOMA IDENTIFIED 4. Cul-de-sac biopsy - MESOTHELIAL HYPERPLASIA Microscopic Comment 3. ONCOLOGY TABLE-UTERUS, CARCINOMA OR CARCINOSARCOMA Specimen: Uterus, right fallopian tube and ovary Procedure: Total hysterectomy and right salpingo-oophorectomy Lymph node sampling performed: Bilateral pelvic regional resection Specimen integrity: Intact Maximum tumor size: 3 cm (polyp) Histologic type: Serous carcinoma Grade: High grade Myometrial invasion: Not identified Cervical stromal involvement: No, focal endocervical gland involvement Extent of involvement of other organs: Not identified Lymph - vascular invasion: Not  identified Peritoneal washings: N/A Lymph nodes: Examined: 0 Sentinel 13 Non-sentinel 13 Total Lymph nodes with metastasis: 0 Isolated tumor cells (< 0.2 mm): 0 Micrometastasis: (> 0.2 mm and < 2.0 mm): 0 Macrometastasis: (> 2.0 mm): 0 Extracapsular extension: N/A Pelvic lymph nodes: 0 involved of 13 lymph nodes. Para-aortic lymph nodes: No para-aortic nodes submitted TNM code: pT1a, pNX FIGO Stage (based on pathologic findings, needs clinical correlation): IA Comment: Immunohistochemistry for cytokeratin AE1/AE3 is performed on all of the lymph nodes (parts 1 & 2) and no metastatic carcinoma is identified.   07/04/2017 Genetic Testing   The patient had genetic testing due to a personal history of breast and uterine cancer, and a family history of stomach cancer.  The Multi-Cancer Panel was ordered. The Multi-Cancer Panel offered by [facility] includes sequencing and/or deletion duplication testing of the following 83 genes: ALK, APC, ATM, AXIN2,BAP1,  BARD1, BLM, BMPR1A, BRCA1, BRCA2, BRIP1, CASR, CDC73, CDH1, CDK4, CDKN1B, CDKN1C, CDKN2A (p14ARF), CDKN2A (p16INK4a), CEBPA, CHEK2, CTNNA1, DICER1, DIS3L2, EGFR (c.2369C>T, p.Thr790Met variant only), EPCAM (Deletion/duplication testing only), FH, FLCN, GATA2, GPC3, GREM1 (Promoter region deletion/duplication testing only), HOXB13 (c.251G>A, p.Gly84Glu), HRAS, KIT, MAX, MEN1, MET, MITF (c.952G>A, p.Glu318Lys variant only), MLH1, MSH2, MSH3, MSH6, MUTYH, NBN, NF1, NF2, NTHL1, PALB2, PDGFRA, PHOX2B, PMS2, POLD1, POLE, POT1, PRKAR1A, PTCH1, PTEN, RAD50, RAD51C, RAD51D, RB1, RECQL4, RET, RUNX1, SDHAF2, SDHA (sequence changes only), SDHB, SDHC, SDHD, SMAD4, SMARCA4, SMARCB1, SMARCE1, STK11, SUFU, TERC, TERT, TMEM127, TP53, TSC1, TSC2, VHL, WRN and WT1.   Results: No pathogenic variants were identified.  A variant of uncertain significance in the gene APC was identified.  c.791A>G (p.Gln264Arg).  The date of this test report is 07/04/2017.     Genetic  Testing   Patient has genetic testing done for MSI. 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    12/03/2017 Imaging   CT Chest/Abd/Pelvis to follow pulmonary nodule and gastric mass IMPRESSION: 1. Stable CT of the chest. Small pulmonary nodules are unchanged when compared with previous exam. 2. No new findings identified. 3. Subcentimeter low-attenuation lesions within the liver are remain too small to characterize but are stable from prior exam. 4. Persistent indeterminate low-attenuation structure within the proximal stomach is unchanged measuring 1.4 cm. Correlation with direct visualization is advised   06/30/2018 Imaging   CT CHEST Lungs/Pleura: Stable scattered sub-cm pulmonary nodules are again seen bilaterally and are stable compared to previous studies. No new or enlarging pulmonary nodules or masses identified. No evidence of pulmonary infiltrate or pleural effusion.   08/19/2018 Echocardiogram   [place] is done in [place]: EF 60-65%. Mild impaired relaxation. (report scanned)   09/17/2018 Relapse/Recurrence   Presented with c/o vagina discharge.  Lesion noted at the left vaginal apex 19m lesion removed Path c/w high grade serous cancer   09/17/2018 Pathology Results   Vagina, biopsy, left apex - HIGH GRADE SEROUS CARCINOMA.   09/28/2018 PET scan   1. Two hypermetabolic axial lymph nodes. Unusual site for metastatic endometrial carcinoma however the activity is more intense than typically seen in reactive adenopathy. Suggest ultrasound-guided percutaneous biopsy of the larger RIGHT axial lymph node 2. No evidence of local recurrence at the vaginal cuff.  3. No metastatic adenopathy in the abdomen or pelvis. 4. Stable small pulmonary nodules   10/09/2018 Pathology Results   Lymph node, needle/core biopsy, right axilla - METASTATIC CARCINOMA, SEE COMMENT. Microscopic Comment The carcinoma appears high grade. Immunohistochemistry is positive for  cytokeratin 7, PAX-8, and ER. Cytokeratin 20, CDX-2,  PR, and GATA-3 are negative. The findings along with the history are consistent with a gynecologic primary.    10/09/2018 Procedure   Ultrasound-guided core biopsies of a right axillary lymph node.   10/14/2018 Cancer Staging   Staging form: Corpus Uteri - Carcinoma and Carcinosarcoma, AJCC 8th Edition - Clinical: Stage IVB (cT1, cN0, pM1) - Signed by [name], MD on 10/14/2018   10/19/2018 Imaging   Placement of single lumen port a cath via right internal jugular vein. The catheter tip lies at the cavo-atrial junction. A power injectable port a cath was placed and is ready for immediate use.    10/21/2018 -  Chemotherapy   The patient had carboplatin and taxol for treatment   11/11/2018 - 03/12/2022 Chemotherapy   Patient is on Treatment Plan : BREAST Trastuzumab q21d     12/21/2018 PET scan   1. Interval resolution of hypermetabolic right axillary lymph nodes. No metabolic findings highly suspicious for recurrent metastatic disease. 2. New mild hypermetabolism within a borderline prominent portacaval lymph node, nonspecific. While a reactive node is favored, a lymph node metastasis cannot be entirely excluded. Suggest attention to this lymph node on follow-up PET-CT in 3-6 months. 3. Nonspecific new hypermetabolism at the ileocecal valve, more likely physiologic given absence of CT correlate. 4. Scattered subcentimeter pulmonary nodules are all stable and below PET resolution, more likely benign, continued CT surveillance advised. 5. Chronic findings include: Aortic Atherosclerosis (ICD10-I70.0). Marked diffuse colonic diverticulosis. Coronary atherosclerosis.     12/28/2018 Echocardiogram   1. The left ventricle has normal systolic function with an ejection fraction of 60-65%. The cavity size was normal. Left ventricular diastolic Doppler parameters are consistent with impaired relaxation.  2. GLS recorded as -10.7 but LV appears hyperdynamic  and tracking of endocardium appears poor.  3. The right ventricle has normal systolic function. The cavity was normal. There is no increase in right ventricular wall thickness.  4. Mild thickening of the mitral valve leaflet.  5. The aortic valve was not well visualized. Mild thickening of the aortic valve. Aortic valve regurgitation is trivial by color flow Doppler.   03/23/2019 Imaging   PET 1. No findings of hypermetabolic residual/recurrent or metastatic disease. 2. Similar low-level hypermetabolism within normal sized portocaval and right inguinal nodes, favored to be reactive. 3. Ongoing stability of small bilateral pulmonary nodules, favored to be benign. Below PET resolution. 4. Coronary artery atherosclerosis. Aortic Atherosclerosis   03/26/2019 Echocardiogram   1. The left ventricle has normal systolic function, with an ejection fraction of 55-60%. The cavity size was normal. Left ventricular diastolic Doppler parameters are consistent with impaired relaxation.  2. The right ventricle has normal systolic function. The cavity was normal.  3. The mitral valve is abnormal. Mild thickening of the mitral valve leaflet. There is mild mitral annular calcification present.  4. The tricuspid valve is grossly normal.  5. The aortic valve is tricuspid. Mild calcification of the aortic valve. No stenosis of the aortic valve.  6. The aorta is normal unless otherwise noted.  7. Normal LV systolic function; grade 1 diastolic dysfunction; OFB-51.0%.   06/30/2019 Imaging   Chest Impression:   1. No evidence thoracic metastasis. 2. Stable small benign-appearing pulmonary nodules   Abdomen / Pelvis Impression:   1. No evidence of metastatic disease in the abdomen pelvis. 2.  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 3.  Aortic Atherosclerosis (ICD10-I70.0).   06/30/2019 Echocardiogram    1. Left ventricular ejection fraction, by visual estimation, is  60 to 65%. The left  ventricle has normal function. There is no left ventricular hypertrophy.  2. Left ventricular diastolic parameters are consistent with Grade I diastolic dysfunction (impaired relaxation).  3. The left ventricle has no regional wall motion abnormalities.  4. Global right ventricle has normal systolic function.The right ventricular size is normal. No increase in right ventricular wall thickness.  5. Left atrial size was mild-moderately dilated.  6. Right atrial size was normal.  7. The mitral valve is normal in structure. Trivial mitral valve regurgitation.  8. The tricuspid valve is normal in structure. Tricuspid valve regurgitation is trivial.  9. The aortic valve is normal in structure. Aortic valve regurgitation is trivial. No evidence of aortic valve sclerosis or stenosis. 10. The pulmonic valve was grossly normal. Pulmonic valve regurgitation is not visualized. 11. Mildly elevated pulmonary artery systolic pressure. 12. The inferior vena cava is normal in size with greater than 50% respiratory variability, suggesting right atrial pressure of 3 mmHg. 13. The average left ventricular global longitudinal strain is -12.5 %. GLS underestimated due to poor endocardial tracking.     09/27/2019 Imaging   1. Multiple small, nonspecific pulmonary nodules are again noted. The previously noted lung nodules are unchanged in size from previous exam. There is a new lung nodule within the medial right lower lobe which is nonspecific measure 4 mm. Attention at follow-up imaging is recommended. 2. No evidence of metastatic disease within the abdomen or pelvis. 3. Coronary artery calcifications.  The 4.  Aortic Atherosclerosis (ICD10-I70.0).   09/30/2019 Echocardiogram    1. Left ventricular ejection fraction, by estimation, is 60 to 65%. The left ventricle has normal function. The left ventricle has no regional wall motion abnormalities. Left ventricular diastolic parameters are consistent with Grade I  diastolic dysfunction (impaired relaxation).  2. Right ventricular systolic function is normal. The right ventricular size is normal. There is normal pulmonary artery systolic pressure. The estimated right ventricular systolic pressure is 35.5 mmHg.  3. The mitral valve is normal in structure. No evidence of mitral valve regurgitation. No evidence of mitral stenosis.  4. The aortic valve is normal in structure. Aortic valve regurgitation is trivial. No aortic stenosis is present.  5. The inferior vena cava is normal in size with greater than 50% respiratory variability, suggesting right atrial pressure of 3 mmHg.     01/07/2020 Echocardiogram    1. Normal LV function; grade 1 diastolic dysfunction; mild AI; GLS -21%.  2. Left ventricular ejection fraction, by estimation, is 55 to 60%. The left ventricle has normal function. The left ventricle has no regional wall motion abnormalities. Left ventricular diastolic parameters are consistent with Grade I diastolic dysfunction (impaired relaxation).  3. Right ventricular systolic function is normal. The right ventricular size is normal.  4. The mitral valve is normal in structure. Trivial mitral valve regurgitation. No evidence of mitral stenosis.  5. The aortic valve is tricuspid. Aortic valve regurgitation is mild. Mild aortic valve sclerosis is present, with no evidence of aortic valve stenosis.  6. The inferior vena cava is normal in size with greater than 50% respiratory variability, suggesting right atrial pressure of 3 mmHg.     03/27/2020 Imaging   1. Status post hysterectomy. No evidence of recurrent or metastatic disease in the abdomen or pelvis. 2. Multiple small pulmonary nodules in the lung bases are unchanged to the extent that they are included on current examination and remain most likely sequelae of prior infection or inflammation. Attention on follow-up.  3. Aortic Atherosclerosis (ICD10-I70.0).   04/17/2020 Echocardiogram   1. Normal  LVEF, normal and unchanged GLS: -20.2%.  2. Left ventricular ejection fraction, by estimation, is 60 to 65%. The left ventricle has normal function. The left ventricle has no regional wall motion abnormalities. There is mild concentric left ventricular hypertrophy. Left ventricular diastolic parameters are consistent with Grade I diastolic dysfunction (impaired relaxation). The average left ventricular global longitudinal strain is -20.2 %. The global longitudinal strain is normal.  3. Right ventricular systolic function is normal. The right ventricular size is normal.  4. The mitral valve is normal in structure. Mild mitral valve regurgitation. No evidence of mitral stenosis.  5. The aortic valve is normal in structure. There is mild calcification of the aortic valve. There is mild thickening of the aortic valve. Aortic valve regurgitation is mild. No aortic stenosis is present.  6. The inferior vena cava is normal in size with greater than 50% respiratory variability, suggesting right atrial pressure of 3 mmHg.   08/02/2020 Echocardiogram   1. Left ventricular ejection fraction, by estimation, is 55 to 60%. The left ventricle has normal function. The left ventricle has no regional wall motion abnormalities. Left ventricular diastolic parameters are consistent with Grade I diastolic dysfunction (impaired relaxation). The average left ventricular global longitudinal strain is -27.0 %. The global longitudinal strain is normal.  2. Right ventricular systolic function is normal. The right ventricular size is normal. Tricuspid regurgitation signal is inadequate for assessing PA pressure.  3. The mitral valve is grossly normal. Trivial mitral valve regurgitation. No evidence of mitral stenosis.  4. The aortic valve is tricuspid. Aortic valve regurgitation is mild. No aortic stenosis is present.  5. The inferior vena cava is normal in size with greater than 50% respiratory variability, suggesting right atrial  pressure of 3 mmHg.   09/19/2020 Imaging   1. Status post hysterectomy. No evidence of recurrent or new metastatic disease within the chest, abdomen, or pelvis. 2. Scattered bilateral tiny pulmonary nodules, unchanged from prior studies and most likely sequela of prior infection or inflammation, recommend attention on follow-up imaging. No new suspicious pulmonary nodules or masses. 3. Subcutaneous edema and small hematoma overlying the posterior gluteal musculature, recommend correlation with recent history of trauma. 4. Mild low-density wall thickening of the gastric antrum, which may represent gastritis. 5. Hepatic steatosis. 6. Extensive sigmoid colonic diverticulosis without findings of acute diverticulitis. 7. Aortic atherosclerosis.  Aortic Atherosclerosis (ICD10-I70.0).   11/20/2020 Echocardiogram    1. Compared to echo from Jan 2022, Global longitudinal strain is less negative. (Previously -27%). Left ventricular ejection fraction, by estimation, is 60 to 65%. The left ventricle has normal function. The left ventricle has no regional wall motion abnormalities. There is mild left ventricular hypertrophy. Left ventricular diastolic parameters are indeterminate.  2. Right ventricular systolic function is normal. The right ventricular size is normal.  3. The mitral valve is normal in structure. Trivial mitral valve regurgitation.  4. Aortic valve regurgitation is mild. Mild aortic valve sclerosis is present, with no evidence of aortic valve stenosis.     03/12/2021 Echocardiogram   1. Left ventricular ejection fraction, by estimation, is 60 to 65%. Left ventricular ejection fraction by 2D MOD biplane is 63.6 %. The left ventricle has normal function. The left ventricle has no regional wall motion abnormalities. There is mild left ventricular hypertrophy. Left ventricular diastolic parameters are consistent with Grade I diastolic dysfunction (impaired relaxation). The average left ventricular  global longitudinal strain is -20.0 %.  The global longitudinal strain is normal.  2. Right ventricular systolic function is normal. The right ventricular size is normal. There is normal pulmonary artery systolic pressure. The estimated right ventricular systolic pressure is 57.0 mmHg.  3. The mitral valve is normal in structure. No evidence of mitral valve regurgitation. No evidence of mitral stenosis.  4. The aortic valve is tricuspid. Aortic valve regurgitation is trivial. No aortic stenosis is present. Aortic regurgitation PHT measures 403 msec.  5. The inferior vena cava is normal in size with greater than 50% respiratory variability, suggesting right atrial pressure of 3 mmHg.   04/02/2021 Imaging   Stable small pulmonary nodules scattered throughout the chest, unchanged, without new or suspicious pulmonary nodule.   Three-vessel coronary artery disease.   Aortic Atherosclerosis (ICD10-I70.0).   06/06/2021 Imaging    1. Left ventricular ejection fraction, by estimation, is 60 to 65%. The left ventricle has normal function. The left ventricle has no regional wall motion abnormalities. Left ventricular diastolic parameters are consistent with Grade I diastolic dysfunction (impaired relaxation).  2. Right ventricular systolic function is normal. The right ventricular size is normal. There is normal pulmonary artery systolic pressure. The estimated right ventricular systolic pressure is 17.7 mmHg.  3. Left atrial size was mildly dilated.  4. The mitral valve is normal in structure. Trivial mitral valve regurgitation.  5. The aortic valve is tricuspid. Aortic valve regurgitation is mild. No aortic stenosis is present.  6. The inferior vena cava is normal in size with greater than 50% respiratory variability, suggesting right atrial pressure of 3 mmHg.  7. GLS attempted but not reported due to suboptimal tracking.     09/25/2021 Echocardiogram    1. Left ventricular ejection fraction, by  estimation, is 60 to 65%. Left ventricular ejection fraction by 3D volume is 56 %. The left ventricle has normal function. The left ventricle has no regional wall motion abnormalities. Left ventricular diastolic parameters were normal.  2. Right ventricular systolic function is normal. The right ventricular size is normal. There is normal pulmonary artery systolic pressure.  3. The mitral valve is normal in structure. Trivial mitral valve regurgitation. No evidence of mitral stenosis.  4. The aortic valve is tricuspid. Aortic valve regurgitation is mild. No aortic stenosis is present.  5. The inferior vena cava is normal in size with greater than 50% respiratory variability, suggesting right atrial pressure of 3 mmHg.   10/15/2021 Imaging   1. Stable examination post prior hysterectomy without evidence of local recurrence or metastatic disease within the chest, abdomen, or pelvis. 2. Unchanged small bilateral pulmonary nodules, likely benign. 3. Left-sided colonic diverticulosis without findings of acute diverticulitis. 4. Aortic Atherosclerosis (ICD10-I70.0).       12/31/2021 Echocardiogram    1. Left ventricular ejection fraction, by estimation, is 65 to 70%. The left ventricle has normal function. The left ventricle has no regional wall motion abnormalities. Left ventricular diastolic parameters are consistent with Grade I diastolic dysfunction (impaired relaxation).  2. Right ventricular systolic function is normal. The right ventricular size is normal. There is normal pulmonary artery systolic pressure. The estimated right ventricular systolic pressure is 93.9 mmHg.  3. The mitral valve is grossly normal. Trivial mitral valve regurgitation. No evidence of mitral stenosis.  4. The aortic valve is tricuspid. Aortic valve regurgitation is mild. No aortic stenosis is present.  5. The inferior vena cava is normal in size with greater than 50% respiratory variability, suggesting right atrial pressure  of 3 mmHg.  04/02/2022 -  Chemotherapy   Patient is on Treatment Plan : BREAST MAINTENANCE Trastuzumab IV (6) or SQ (600) D1 q21d X 11 Cycles     04/04/2022 Echocardiogram    1. Left ventricular ejection fraction, by estimation, is 65 to 70%. The left ventricle has normal function. The left ventricle has no regional wall motion abnormalities. Left ventricular diastolic parameters are consistent with Grade I diastolic dysfunction (impaired relaxation).  2. Right ventricular systolic function is normal. The right ventricular size is normal.  3. Left atrial size was mildly dilated.  4. The mitral valve is normal in structure. Trivial mitral valve regurgitation. No evidence of mitral stenosis.  5. The aortic valve is tricuspid. There is mild calcification of the aortic valve. Aortic valve regurgitation is mild. No aortic stenosis is present. Aortic regurgitation PHT measures 613 msec. Aortic valve area, by VTI measures 1.70 cm. Aortic valve mean gradient measures 3.0 mmHg. Aortic valve Vmax measures 1.15 m/s.  6. The inferior vena cava is normal in size with greater than 50% respiratory variability, suggesting right atrial pressure of 3 mmHg.   07/02/2022 Echocardiogram   1. Left ventricular ejection fraction, by estimation, is 60 to 65%. Left ventricular ejection fraction by 3D volume is 58 %. The left ventricle has normal function. The left ventricle has no regional wall motion abnormalities. Left ventricular diastolic parameters are consistent with Grade I diastolic dysfunction (impaired relaxation). The average left ventricular global longitudinal strain is -20.7 %. The global longitudinal strain is normal.  2. Right ventricular systolic function is normal. The right ventricular size is normal. Tricuspid regurgitation signal is inadequate for assessing PA pressure.  3. Left atrial size was mildly dilated.  4. The mitral valve is normal in structure. Trivial mitral valve regurgitation. No evidence  of mitral stenosis.  5. The aortic valve is grossly normal. Aortic valve regurgitation is mild. No aortic stenosis is present.  6. The inferior vena cava is normal in size with greater than 50% respiratory variability, suggesting right atrial pressure of 3 mmHg.   Metastasis to lymph nodes ([place])  10/14/2018 Initial Diagnosis   Metastasis to lymph nodes ([place])   10/21/2018 - 02/24/2019 Chemotherapy   The patient had palonosetron (ALOXI) injection 0.25 mg, 0.25 mg, Intravenous,  Once, 7 of 7 cycles Administration: 0.25 mg (10/21/2018), 0.25 mg (11/11/2018), 0.25 mg (12/02/2018), 0.25 mg (12/23/2018), 0.25 mg (01/13/2019), 0.25 mg (02/03/2019), 0.25 mg (02/24/2019) CARBOplatin (PARAPLATIN) 310 mg in sodium chloride 0.9 % 250 mL chemo infusion, 310 mg, Intravenous,  Once, 7 of 7 cycles Dose modification: 300 mg (original dose 311.5 mg, Cycle 4, Reason: Dose not tolerated) Administration: 310 mg (10/21/2018), 300 mg (11/11/2018), 300 mg (12/02/2018), 300 mg (12/23/2018), 300 mg (01/13/2019), 300 mg (02/03/2019), 300 mg (02/24/2019) PACLitaxel (TAXOL) 222 mg in sodium chloride 0.9 % 250 mL chemo infusion (> 5m/m2), 135 mg/m2 = 222 mg, Intravenous,  Once, 7 of 7 cycles Administration: 222 mg (10/21/2018), 222 mg (11/11/2018), 222 mg (12/02/2018), 222 mg (12/23/2018), 222 mg (01/13/2019), 222 mg (02/03/2019), 222 mg (02/24/2019) fosaprepitant (EMEND) 150 mg, dexamethasone (DECADRON) 12 mg in sodium chloride 0.9 % 145 mL IVPB, , Intravenous,  Once, 7 of 7 cycles Administration:  (10/21/2018),  (11/11/2018),  (12/02/2018),  (12/23/2018),  (01/13/2019),  (02/03/2019),  (02/24/2019)  for chemotherapy treatment.    04/02/2022 -  Chemotherapy   Patient is on Treatment Plan : BREAST MAINTENANCE Trastuzumab IV (6) or SQ (600) D1 q21d X 11 Cycles       PHYSICAL  EXAMINATION: ECOG PERFORMANCE STATUS: 0 - Asymptomatic  Vitals:   07/18/22 0949  BP: (!) 142/50  Pulse: 71  Resp: 18  Temp: 97.8 F (36.6 C)  SpO2: 96%   Filed Weights    07/18/22 0949  Weight: 134 lb 6.4 oz (61 kg)    GENERAL:alert, no distress and comfortable NEURO: alert & oriented x 3 with fluent speech, no focal motor/sensory deficits  LABORATORY DATA:  I have reviewed the data as listed    Component Value Date/Time   NA 138 07/18/2022 0929   NA 139 02/13/2017 1512   K 3.5 07/18/2022 0929   K 4.2 02/13/2017 1512   CL 102 07/18/2022 0929   CO2 26 07/18/2022 0929   CO2 28 02/13/2017 1512   GLUCOSE 92 07/18/2022 0929   GLUCOSE 118 02/13/2017 1512   BUN 23 07/18/2022 0929   BUN 25.2 02/13/2017 1512   CREATININE 1.29 (H) 07/18/2022 0929   CREATININE 1.2 (H) 02/13/2017 1512   CALCIUM 9.3 07/18/2022 0929   CALCIUM 10.0 02/13/2017 1512   PROT 6.8 03/12/2022 1000   ALBUMIN 4.4 03/12/2022 1000   AST 23 03/12/2022 1000   AST 19 12/21/2019 0850   ALT 13 03/12/2022 1000   ALT 13 12/21/2019 0850   ALKPHOS 51 03/12/2022 1000   BILITOT 0.4 03/12/2022 1000   BILITOT 0.4 12/21/2019 0850   GFRNONAA 41 (L) 07/18/2022 0929   GFRAA 47 (L) 03/28/2020 0933   GFRAA 42 (L) 12/21/2019 0850    No results found for: "SPEP", "UPEP"  Lab Results  Component Value Date   WBC 5.1 07/18/2022   NEUTROABS 2.5 07/18/2022   HGB 10.6 (L) 07/18/2022   HCT 30.1 (L) 07/18/2022   MCV 89.6 07/18/2022   PLT 207 07/18/2022      Chemistry      Component Value Date/Time   NA 138 07/18/2022 0929   NA 139 02/13/2017 1512   K 3.5 07/18/2022 0929   K 4.2 02/13/2017 1512   CL 102 07/18/2022 0929   CO2 26 07/18/2022 0929   CO2 28 02/13/2017 1512   BUN 23 07/18/2022 0929   BUN 25.2 02/13/2017 1512   CREATININE 1.29 (H) 07/18/2022 0929   CREATININE 1.2 (H) 02/13/2017 1512      Component Value Date/Time   CALCIUM 9.3 07/18/2022 0929   CALCIUM 10.0 02/13/2017 1512   ALKPHOS 51 03/12/2022 1000   AST 23 03/12/2022 1000   AST 19 12/21/2019 0850   ALT 13 03/12/2022 1000   ALT 13 12/21/2019 0850   BILITOT 0.4 03/12/2022 1000   BILITOT 0.4 12/21/2019 0850

## 2022-07-18 NOTE — Assessment & Plan Note (Signed)
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

## 2022-07-18 NOTE — Assessment & Plan Note (Signed)
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 

## 2022-07-18 NOTE — Patient Instructions (Signed)
[facility]  Discharge Instructions: Thank you for choosing [facility] to provide your oncology and hematology care.   If you have a lab appointment with the [facility], please go directly to the [facility] and check in at the registration area.   Wear comfortable clothing and clothing appropriate for easy access to any Portacath or PICC line.   We strive to give you quality time with your provider. You may need to reschedule your appointment if you arrive late (15 or more minutes).  Arriving late affects you and other patients whose appointments are after yours.  Also, if you miss three or more appointments without notifying the office, you may be dismissed from the clinic at the provider's discretion.      For prescription refill requests, have your pharmacy contact our office and allow 72 hours for refills to be completed.    Today you received the following chemotherapy and/or immunotherapy agents: trastuzumab-anns      To help prevent nausea and vomiting after your treatment, we encourage you to take your nausea medication as directed.  BELOW ARE SYMPTOMS THAT SHOULD BE REPORTED IMMEDIATELY: *FEVER GREATER THAN 100.4 F (38 C) OR HIGHER *CHILLS OR SWEATING *NAUSEA AND VOMITING THAT IS NOT CONTROLLED WITH YOUR NAUSEA MEDICATION *UNUSUAL SHORTNESS OF BREATH *UNUSUAL BRUISING OR BLEEDING *URINARY PROBLEMS (pain or burning when urinating, or frequent urination) *BOWEL PROBLEMS (unusual diarrhea, constipation, pain near the anus) TENDERNESS IN MOUTH AND THROAT WITH OR WITHOUT PRESENCE OF ULCERS (sore throat, sores in mouth, or a toothache) UNUSUAL RASH, SWELLING OR PAIN  UNUSUAL VAGINAL DISCHARGE OR ITCHING   Items with * indicate a potential emergency and should be followed up as soon as possible or go to the Emergency Department if any problems should occur.  Please show the CHEMOTHERAPY ALERT CARD or IMMUNOTHERAPY ALERT CARD at  check-in to the Emergency Department and triage nurse.  Should you have questions after your visit or need to cancel or reschedule your appointment, please contact [facility]  Dept: [phone]  and follow the prompts.  Office hours are 8:00 a.m. to 4:30 p.m. Monday - Friday. Please note that voicemails left after 4:00 p.m. may not be returned until the following business day.  We are closed weekends and major holidays. You have access to a nurse at all times for urgent questions. Please call the main number to the clinic Dept: [phone] and follow the prompts.   For any non-urgent questions, you may also contact your provider using MyChart. We now offer e-Visits for anyone 56 and older to request care online for non-urgent symptoms. For details visit [email or web address].   Also download the MyChart app! Go to the app store, search "MyChart", open the app, select [facility], and log in with your MyChart username and password.  Masks are optional in the cancer centers. If you would like for your care team to wear a mask while they are taking care of you, please let them know. You may have one support person who is at least 83 years old accompany you for your appointments.

## 2022-07-22 ENCOUNTER — Telehealth: Payer: Self-pay | Admitting: Hematology and Oncology

## 2022-07-22 NOTE — Telephone Encounter (Signed)
Scheduled appointments per WQ. Patient is aware of all made appointments. 

## 2022-07-23 ENCOUNTER — Other Ambulatory Visit: Payer: Self-pay

## 2022-07-25 ENCOUNTER — Ambulatory Visit: Payer: Medicare HMO | Admitting: Gynecologic Oncology

## 2022-08-05 DIAGNOSIS — K219 Gastro-esophageal reflux disease without esophagitis: Secondary | ICD-10-CM | POA: Diagnosis not present

## 2022-08-05 DIAGNOSIS — E039 Hypothyroidism, unspecified: Secondary | ICD-10-CM | POA: Diagnosis not present

## 2022-08-05 DIAGNOSIS — C7800 Secondary malignant neoplasm of unspecified lung: Secondary | ICD-10-CM | POA: Diagnosis not present

## 2022-08-05 DIAGNOSIS — R7989 Other specified abnormal findings of blood chemistry: Secondary | ICD-10-CM | POA: Diagnosis not present

## 2022-08-05 DIAGNOSIS — D849 Immunodeficiency, unspecified: Secondary | ICD-10-CM | POA: Diagnosis not present

## 2022-08-05 DIAGNOSIS — C541 Malignant neoplasm of endometrium: Secondary | ICD-10-CM | POA: Diagnosis not present

## 2022-08-05 DIAGNOSIS — E785 Hyperlipidemia, unspecified: Secondary | ICD-10-CM | POA: Diagnosis not present

## 2022-08-05 DIAGNOSIS — I10 Essential (primary) hypertension: Secondary | ICD-10-CM | POA: Diagnosis not present

## 2022-08-05 DIAGNOSIS — N1832 Chronic kidney disease, stage 3b: Secondary | ICD-10-CM | POA: Diagnosis not present

## 2022-08-05 DIAGNOSIS — Z0000 Encounter for general adult medical examination without abnormal findings: Secondary | ICD-10-CM | POA: Diagnosis not present

## 2022-08-05 DIAGNOSIS — I1310 Hypertensive heart and chronic kidney disease without heart failure, with stage 1 through stage 4 chronic kidney disease, or unspecified chronic kidney disease: Secondary | ICD-10-CM | POA: Diagnosis not present

## 2022-08-06 DIAGNOSIS — M25512 Pain in left shoulder: Secondary | ICD-10-CM | POA: Diagnosis not present

## 2022-08-06 DIAGNOSIS — M25511 Pain in right shoulder: Secondary | ICD-10-CM | POA: Diagnosis not present

## 2022-08-06 DIAGNOSIS — M19012 Primary osteoarthritis, left shoulder: Secondary | ICD-10-CM | POA: Diagnosis not present

## 2022-08-07 DIAGNOSIS — H353131 Nonexudative age-related macular degeneration, bilateral, early dry stage: Secondary | ICD-10-CM | POA: Diagnosis not present

## 2022-08-07 DIAGNOSIS — H35372 Puckering of macula, left eye: Secondary | ICD-10-CM | POA: Diagnosis not present

## 2022-08-07 DIAGNOSIS — H35453 Secondary pigmentary degeneration, bilateral: Secondary | ICD-10-CM | POA: Diagnosis not present

## 2022-08-07 DIAGNOSIS — H59812 Chorioretinal scars after surgery for detachment, left eye: Secondary | ICD-10-CM | POA: Diagnosis not present

## 2022-08-07 DIAGNOSIS — H35363 Drusen (degenerative) of macula, bilateral: Secondary | ICD-10-CM | POA: Diagnosis not present

## 2022-08-07 DIAGNOSIS — H338 Other retinal detachments: Secondary | ICD-10-CM | POA: Diagnosis not present

## 2022-08-08 ENCOUNTER — Inpatient Hospital Stay: Payer: Medicare HMO

## 2022-08-08 ENCOUNTER — Other Ambulatory Visit: Payer: Self-pay

## 2022-08-08 VITALS — BP 132/68 | HR 73 | Temp 97.9°F | Resp 18 | Wt 133.0 lb

## 2022-08-08 DIAGNOSIS — Z90722 Acquired absence of ovaries, bilateral: Secondary | ICD-10-CM | POA: Diagnosis not present

## 2022-08-08 DIAGNOSIS — D630 Anemia in neoplastic disease: Secondary | ICD-10-CM | POA: Diagnosis not present

## 2022-08-08 DIAGNOSIS — Z9079 Acquired absence of other genital organ(s): Secondary | ICD-10-CM | POA: Diagnosis not present

## 2022-08-08 DIAGNOSIS — Z7189 Other specified counseling: Secondary | ICD-10-CM

## 2022-08-08 DIAGNOSIS — C541 Malignant neoplasm of endometrium: Secondary | ICD-10-CM

## 2022-08-08 DIAGNOSIS — C773 Secondary and unspecified malignant neoplasm of axilla and upper limb lymph nodes: Secondary | ICD-10-CM | POA: Diagnosis not present

## 2022-08-08 DIAGNOSIS — N189 Chronic kidney disease, unspecified: Secondary | ICD-10-CM | POA: Diagnosis not present

## 2022-08-08 DIAGNOSIS — Z5112 Encounter for antineoplastic immunotherapy: Secondary | ICD-10-CM | POA: Diagnosis not present

## 2022-08-08 DIAGNOSIS — Z79899 Other long term (current) drug therapy: Secondary | ICD-10-CM | POA: Diagnosis not present

## 2022-08-08 DIAGNOSIS — Z90710 Acquired absence of both cervix and uterus: Secondary | ICD-10-CM | POA: Diagnosis not present

## 2022-08-08 MED ORDER — ACETAMINOPHEN 325 MG PO TABS
650.0000 mg | ORAL_TABLET | Freq: Once | ORAL | Status: AC
  Administered 2022-08-08: 650 mg via ORAL

## 2022-08-08 MED ORDER — DIPHENHYDRAMINE HCL 25 MG PO CAPS
25.0000 mg | ORAL_CAPSULE | Freq: Once | ORAL | Status: AC
  Administered 2022-08-08: 25 mg via ORAL

## 2022-08-08 MED ORDER — SODIUM CHLORIDE 0.9 % IV SOLN: Freq: Once | INTRAVENOUS | Status: AC

## 2022-08-08 MED ORDER — TRASTUZUMAB-ANNS CHEMO 150 MG IV SOLR
6.0000 mg/kg | Freq: Once | INTRAVENOUS | Status: AC
  Administered 2022-08-08: 357 mg via INTRAVENOUS
  Filled 2022-08-08: qty 17

## 2022-08-08 MED ORDER — SODIUM CHLORIDE 0.9% FLUSH
10.0000 mL | INTRAVENOUS | Status: DC | PRN
  Administered 2022-08-08: 10 mL

## 2022-08-08 MED ORDER — HEPARIN SOD (PORK) LOCK FLUSH 100 UNIT/ML IV SOLN
500.0000 [IU] | Freq: Once | INTRAVENOUS | Status: AC | PRN
  Administered 2022-08-08: 500 [IU]

## 2022-08-08 NOTE — Patient Instructions (Signed)
[facility]  Discharge Instructions: Thank you for choosing [facility] to provide your oncology and hematology care.   If you have a lab appointment with the Cancer Center, please go directly to the Cancer Center and check in at the registration area.   Wear comfortable clothing and clothing appropriate for easy access to any Portacath or PICC line.   We strive to give you quality time with your provider. You may need to reschedule your appointment if you arrive late (15 or more minutes).  Arriving late affects you and other patients whose appointments are after yours.  Also, if you miss three or more appointments without notifying the office, you may be dismissed from the clinic at the provider's discretion.      For prescription refill requests, have your pharmacy contact our office and allow 72 hours for refills to be completed.    Today you received the following chemotherapy and/or immunotherapy agents: Trastuzumab.       To help prevent nausea and vomiting after your treatment, we encourage you to take your nausea medication as directed.  BELOW ARE SYMPTOMS THAT SHOULD BE REPORTED IMMEDIATELY: *FEVER GREATER THAN 100.4 F (38 C) OR HIGHER *CHILLS OR SWEATING *NAUSEA AND VOMITING THAT IS NOT CONTROLLED WITH YOUR NAUSEA MEDICATION *UNUSUAL SHORTNESS OF BREATH *UNUSUAL BRUISING OR BLEEDING *URINARY PROBLEMS (pain or burning when urinating, or frequent urination) *BOWEL PROBLEMS (unusual diarrhea, constipation, pain near the anus) TENDERNESS IN MOUTH AND THROAT WITH OR WITHOUT PRESENCE OF ULCERS (sore throat, sores in mouth, or a toothache) UNUSUAL RASH, SWELLING OR PAIN  UNUSUAL VAGINAL DISCHARGE OR ITCHING   Items with * indicate a potential emergency and should be followed up as soon as possible or go to the Emergency Department if any problems should occur.  Please show the CHEMOTHERAPY ALERT CARD or IMMUNOTHERAPY ALERT CARD at  check-in to the Emergency Department and triage nurse.  Should you have questions after your visit or need to cancel or reschedule your appointment, please contact [facility]  Dept: [phone]  and follow the prompts.  Office hours are 8:00 a.m. to 4:30 p.m. Monday - Friday. Please note that voicemails left after 4:00 p.m. may not be returned until the following business day.  We are closed weekends and major holidays. You have access to a nurse at all times for urgent questions. Please call the main number to the clinic Dept: [phone] and follow the prompts.   For any non-urgent questions, you may also contact your provider using MyChart. We now offer e-Visits for anyone 18 and older to request care online for non-urgent symptoms. For details visit [email or web address].   Also download the MyChart app! Go to the app store, search "MyChart", open the app, select [facility], and log in with your MyChart username and password.   

## 2022-08-09 DIAGNOSIS — I10 Essential (primary) hypertension: Secondary | ICD-10-CM | POA: Diagnosis not present

## 2022-08-09 DIAGNOSIS — R82998 Other abnormal findings in urine: Secondary | ICD-10-CM | POA: Diagnosis not present

## 2022-08-18 DIAGNOSIS — L509 Urticaria, unspecified: Secondary | ICD-10-CM | POA: Diagnosis not present

## 2022-08-27 ENCOUNTER — Encounter: Payer: Self-pay | Admitting: Gynecologic Oncology

## 2022-08-29 DIAGNOSIS — M25511 Pain in right shoulder: Secondary | ICD-10-CM | POA: Diagnosis not present

## 2022-08-30 ENCOUNTER — Encounter: Payer: Self-pay | Admitting: Gynecologic Oncology

## 2022-08-30 ENCOUNTER — Other Ambulatory Visit: Payer: Self-pay

## 2022-08-30 ENCOUNTER — Inpatient Hospital Stay: Payer: Medicare HMO | Attending: Gynecologic Oncology | Admitting: Gynecologic Oncology

## 2022-08-30 VITALS — BP 160/82 | HR 67 | Temp 97.9°F | Resp 14 | Wt 134.0 lb

## 2022-08-30 DIAGNOSIS — C541 Malignant neoplasm of endometrium: Secondary | ICD-10-CM | POA: Insufficient documentation

## 2022-08-30 DIAGNOSIS — Z7982 Long term (current) use of aspirin: Secondary | ICD-10-CM | POA: Diagnosis not present

## 2022-08-30 DIAGNOSIS — Z90710 Acquired absence of both cervix and uterus: Secondary | ICD-10-CM | POA: Insufficient documentation

## 2022-08-30 DIAGNOSIS — Z79899 Other long term (current) drug therapy: Secondary | ICD-10-CM | POA: Diagnosis not present

## 2022-08-30 DIAGNOSIS — D631 Anemia in chronic kidney disease: Secondary | ICD-10-CM | POA: Insufficient documentation

## 2022-08-30 DIAGNOSIS — C773 Secondary and unspecified malignant neoplasm of axilla and upper limb lymph nodes: Secondary | ICD-10-CM | POA: Insufficient documentation

## 2022-08-30 DIAGNOSIS — N183 Chronic kidney disease, stage 3 unspecified: Secondary | ICD-10-CM | POA: Diagnosis not present

## 2022-08-30 DIAGNOSIS — I1310 Hypertensive heart and chronic kidney disease without heart failure, with stage 1 through stage 4 chronic kidney disease, or unspecified chronic kidney disease: Secondary | ICD-10-CM | POA: Diagnosis not present

## 2022-08-30 DIAGNOSIS — Z5112 Encounter for antineoplastic immunotherapy: Secondary | ICD-10-CM | POA: Diagnosis present

## 2022-08-30 NOTE — Patient Instructions (Signed)
It was good to see you today.  I do not see or feel any evidence of cancer recurrence on your exam.  I will see you for follow-up in 6 months.  Please call clinic in June or July to schedule a visit to see me in August.  As always, if you develop any new and concerning symptoms before your next visit, please call to see me sooner.

## 2022-08-30 NOTE — Progress Notes (Signed)
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 Her cancer was treated with surgery but no radiation or chemotherapy.   Serous endometrial cancer, MSI stable ER positive, PR neg, Her2/neu 3+      Endometrial cancer ([place])  08/29/2016 Pathology Results   Endometrium, biopsy - HIGH GRADE ENDOMETRIAL CARCINOMA, SEE COMMENT. Microscopic Comment The sections show multiple fragments of adenocarcinoma displaying glandular and papillary patterns associated with high grade cytomorphology characterized by nuclear pleomorphism, prominent nucleoli and brisk mitosis. Immunohistochemical stains show that the tumor cells are positive for vimentin, p16, p53 with increased Ki-67 expression. Estrogen and progesterone receptor stains show patchy weak positivity. No significant positivity is seen with CEA. The findings are consistent with high grade endometrial carcinoma and the overall morphology and phenotypic features favor serous carcinoma.   08/29/2016 Initial Diagnosis   She presented with postmenopausal bleeding   10/03/2016 Imaging   CT C/A/P 09/2016 IMPRESSION: 1. Thickening of the endometrial canal up to 19 mm in fundus, presumably corresponding to the patient's reported endometrial carcinoma. 2. Multiple tiny pulmonary nodules scattered throughout the lungs bilaterally measuring 4 mm or less in size. Nodules of this size are typically considered statistically likely benign. In the setting of known primary malignancy, metastatic disease to the lungs is not excluded, but is not strongly favored on today's examination. Attention on followup studies is recommended to ensure the stability or resolution of these nodules. 3. Subcentimeter  low-attenuation lesion in the central aspect of segment 8 of the liver is too small to characterize. This is statistically likely a tiny cyst, but warrants attention on follow-up studies to exclude the possibility of a solitary hepatic metastasis. 4. 1.5 x 1.5 x 1.7 cm well-circumscribed lesion in the proximal stomach. This is of uncertain etiology and significance, and could represent a benign gastric polyp, however, further evaluation with nonemergent endoscopy is suggested in the near future for further evaluation. 5. **An incidental finding of potential clinical significance has been found. 1.1 x 1.6 cm thyroid nodule in the inferior aspect of the right lobe of the thyroid gland. Follow-up evaluation with nonemergent thyroid ultrasound is recommended in the near future to better evaluate this finding. This recommendation follows ACR consensuss guidelines: Managing Incidental Thyroid Nodules Detected on Imaging: White Paper of the ACR Incidental Thyroid Findings Committee. J Am Coll Radiol 2015;12(2):143-150.** 6. Aortic atherosclerosis, in addition to left anterior descending coronary artery disease   10/22/2016 Surgery   Robotic assisted total hysterectomy, BSO and bilateral pelvic lymphadenectomy  Final pathology revealed a 3cm polyp containing serous carcinoma but with no myometrial invasion, no LVSI and negative nodes.  Stage IA Uterine serous cancer   10/22/2016 Pathology Results   1. Lymph nodes, regional resection, right pelvic - SIX BENIGN LYMPH NODES (0/6). 2. Lymph nodes, regional resection, left pelvic - SEVEN BENIGN LYMPH NODES (0/7). 3. Uterus +/- tubes/ovaries, neoplastic, with right ovary and fallopian tube ENDOMYOMETRIUM - SEROUS CARCINOMA ARISING WITHIN AN ENDOMETRIAL POLYP - NO MYOMETRIAL INVASION IDENTIFIED - ADENOMYOSIS - LEIOMYOMA (1 CM) - SEE ONCOLOGY TABLE AND COMMENT CERVIX - CARCINOMA FOCALLY INVOLVES ENDOCERVICAL GLANDS - NABOTHIAN CYSTS RIGHT ADNEXA - BENIGN  OVARY AND FALLOPIAN TUBE - NO CARCINOMA IDENTIFIED 4. Cul-de-sac biopsy - MESOTHELIAL HYPERPLASIA Microscopic  Comment 3. ONCOLOGY TABLE-UTERUS, CARCINOMA OR CARCINOSARCOMA Specimen: Uterus, right fallopian tube and ovary Procedure: Total hysterectomy and right salpingo-oophorectomy Lymph node sampling performed: Bilateral pelvic regional resection Specimen integrity: Intact Maximum tumor size: 3 cm (polyp) Histologic type: Serous carcinoma Grade: High grade Myometrial invasion: Not identified Cervical stromal involvement: No, focal endocervical gland involvement Extent of involvement of other organs: Not identified Lymph - vascular invasion: Not identified Peritoneal washings: N/A Lymph nodes: Examined: 0 Sentinel 13 Non-sentinel 13 Total Lymph nodes with metastasis: 0 Isolated tumor cells (< 0.2 mm): 0 Micrometastasis: (> 0.2 mm and < 2.0 mm): 0 Macrometastasis: (> 2.0 mm): 0 Extracapsular extension: N/A Pelvic lymph nodes: 0 involved of 13 lymph nodes. Para-aortic lymph nodes: No para-aortic nodes submitted TNM code: pT1a, pNX FIGO Stage (based on pathologic findings, needs clinical correlation): IA Comment: Immunohistochemistry for cytokeratin AE1/AE3 is performed on all of the lymph nodes (parts 1 & 2) and no metastatic carcinoma is identified.   07/04/2017 Genetic Testing   The patient had genetic testing due to a personal history of breast and uterine cancer, and a family history of stomach cancer.  The Multi-Cancer Panel was ordered. The Multi-Cancer Panel offered by [facility] includes sequencing and/or deletion duplication testing of the following 83 genes: ALK, APC, ATM, AXIN2,BAP1,  BARD1, BLM, BMPR1A, BRCA1, BRCA2, BRIP1, CASR, CDC73, CDH1, CDK4, CDKN1B, CDKN1C, CDKN2A (p14ARF), CDKN2A (p16INK4a), CEBPA, CHEK2, CTNNA1, DICER1, DIS3L2, EGFR (c.2369C>T, p.Thr790Met variant only), EPCAM (Deletion/duplication testing only), FH, FLCN, GATA2, GPC3, GREM1 (Promoter region  deletion/duplication testing only), HOXB13 (c.251G>A, p.Gly84Glu), HRAS, KIT, MAX, MEN1, MET, MITF (c.952G>A, p.Glu318Lys variant only), MLH1, MSH2, MSH3, MSH6, MUTYH, NBN, NF1, NF2, NTHL1, PALB2, PDGFRA, PHOX2B, PMS2, POLD1, POLE, POT1, PRKAR1A, PTCH1, PTEN, RAD50, RAD51C, RAD51D, RB1, RECQL4, RET, RUNX1, SDHAF2, SDHA (sequence changes only), SDHB, SDHC, SDHD, SMAD4, SMARCA4, SMARCB1, SMARCE1, STK11, SUFU, TERC, TERT, TMEM127, TP53, TSC1, TSC2, VHL, WRN and WT1.   Results: No pathogenic variants were identified.  A variant of uncertain significance in the gene APC was identified.  c.791A>G (p.Gln264Arg).  The date of this test report is 07/04/2017.     Genetic Testing   Patient has genetic testing done for MSI. 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    12/03/2017 Imaging   CT Chest/Abd/Pelvis to follow pulmonary nodule and gastric mass IMPRESSION: 1. Stable CT of the chest. Small pulmonary nodules are unchanged when compared with previous exam. 2. No new findings identified. 3. Subcentimeter low-attenuation lesions within the liver are remain too small to characterize but are stable from prior exam. 4. Persistent indeterminate low-attenuation structure within the proximal stomach is unchanged measuring 1.4 cm. Correlation with direct visualization is advised   06/30/2018 Imaging   CT CHEST Lungs/Pleura: Stable scattered sub-cm pulmonary nodules are again seen bilaterally and are stable compared to previous studies. No new or enlarging pulmonary nodules or masses identified. No evidence of pulmonary infiltrate or pleural effusion.   08/19/2018 Echocardiogram   ECHO is done in [place]: EF 60-65%. Mild impaired relaxation. (report scanned)   09/17/2018 Relapse/Recurrence   Presented with c/o vagina discharge.  Lesion noted at the left vaginal apex 47m lesion removed Path c/w high grade serous cancer   09/17/2018 Pathology Results   Vagina, biopsy, left apex -  HIGH GRADE SEROUS CARCINOMA.   09/28/2018 PET scan   1. Two hypermetabolic axial lymph nodes. Unusual site for metastatic endometrial carcinoma however the activity is more intense than typically seen in reactive adenopathy. Suggest ultrasound-guided percutaneous biopsy  of the larger RIGHT axial lymph node 2. No evidence of local recurrence at the vaginal cuff.  3. No metastatic adenopathy in the abdomen or pelvis. 4. Stable small pulmonary nodules   10/09/2018 Pathology Results   Lymph node, needle/core biopsy, right axilla - METASTATIC CARCINOMA, SEE COMMENT. Microscopic Comment The carcinoma appears high grade. Immunohistochemistry is positive for cytokeratin 7, PAX-8, and ER. Cytokeratin 20, CDX-2, PR, and GATA-3 are negative. The findings along with the history are consistent with a gynecologic primary.    10/09/2018 Procedure   Ultrasound-guided core biopsies of a right axillary lymph node.   10/14/2018 Cancer Staging   Staging form: Corpus Uteri - Carcinoma and Carcinosarcoma, AJCC 8th Edition - Clinical: Stage IVB (cT1, cN0, pM1) - Signed by [name], MD on 10/14/2018   10/19/2018 Imaging   Placement of single lumen port a cath via right internal jugular vein. The catheter tip lies at the cavo-atrial junction. A power injectable port a cath was placed and is ready for immediate use.    10/21/2018 -  Chemotherapy   The patient had carboplatin and taxol for treatment   11/11/2018 - 03/12/2022 Chemotherapy   Patient is on Treatment Plan : BREAST Trastuzumab q21d     12/21/2018 PET scan   1. Interval resolution of hypermetabolic right axillary lymph nodes. No metabolic findings highly suspicious for recurrent metastatic disease. 2. New mild hypermetabolism within a borderline prominent portacaval lymph node, nonspecific. While a reactive node is favored, a lymph node metastasis cannot be entirely excluded. Suggest attention to this lymph node on follow-up PET-CT in 3-6 months. 3.  Nonspecific new hypermetabolism at the ileocecal valve, more likely physiologic given absence of CT correlate. 4. Scattered subcentimeter pulmonary nodules are all stable and below PET resolution, more likely benign, continued CT surveillance advised. 5. Chronic findings include: Aortic Atherosclerosis (ICD10-I70.0). Marked diffuse colonic diverticulosis. Coronary atherosclerosis.     12/28/2018 Echocardiogram   1. The left ventricle has normal systolic function with an ejection fraction of 60-65%. The cavity size was normal. Left ventricular diastolic Doppler parameters are consistent with impaired relaxation.  2. GLS recorded as -10.7 but LV appears hyperdynamic and tracking of endocardium appears poor.  3. The right ventricle has normal systolic function. The cavity was normal. There is no increase in right ventricular wall thickness.  4. Mild thickening of the mitral valve leaflet.  5. The aortic valve was not well visualized. Mild thickening of the aortic valve. Aortic valve regurgitation is trivial by color flow Doppler.   03/23/2019 Imaging   PET 1. No findings of hypermetabolic residual/recurrent or metastatic disease. 2. Similar low-level hypermetabolism within normal sized portocaval and right inguinal nodes, favored to be reactive. 3. Ongoing stability of small bilateral pulmonary nodules, favored to be benign. Below PET resolution. 4. Coronary artery atherosclerosis. Aortic Atherosclerosis   03/26/2019 Echocardiogram   1. The left ventricle has normal systolic function, with an ejection fraction of 55-60%. The cavity size was normal. Left ventricular diastolic Doppler parameters are consistent with impaired relaxation.  2. The right ventricle has normal systolic function. The cavity was normal.  3. The mitral valve is abnormal. Mild thickening of the mitral valve leaflet. There is mild mitral annular calcification present.  4. The tricuspid valve is grossly normal.  5. The aortic  valve is tricuspid. Mild calcification of the aortic valve. No stenosis of the aortic valve.  6. The aorta is normal unless otherwise noted.  7. Normal LV systolic function; grade 1 diastolic dysfunction;  GLS-15.2%.   06/30/2019 Imaging   Chest Impression:   1. No evidence thoracic metastasis. 2. Stable small benign-appearing pulmonary nodules   Abdomen / Pelvis Impression:   1. No evidence of metastatic disease in the abdomen pelvis. 2.  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 3.  Aortic Atherosclerosis (ICD10-I70.0).   06/30/2019 Echocardiogram    1. Left ventricular ejection fraction, by visual estimation, is 60 to 65%. The left ventricle has normal function. There is no left ventricular hypertrophy.  2. Left ventricular diastolic parameters are consistent with Grade I diastolic dysfunction (impaired relaxation).  3. The left ventricle has no regional wall motion abnormalities.  4. Global right ventricle has normal systolic function.The right ventricular size is normal. No increase in right ventricular wall thickness.  5. Left atrial size was mild-moderately dilated.  6. Right atrial size was normal.  7. The mitral valve is normal in structure. Trivial mitral valve regurgitation.  8. The tricuspid valve is normal in structure. Tricuspid valve regurgitation is trivial.  9. The aortic valve is normal in structure. Aortic valve regurgitation is trivial. No evidence of aortic valve sclerosis or stenosis. 10. The pulmonic valve was grossly normal. Pulmonic valve regurgitation is not visualized. 11. Mildly elevated pulmonary artery systolic pressure. 12. The inferior vena cava is normal in size with greater than 50% respiratory variability, suggesting right atrial pressure of 3 mmHg. 13. The average left ventricular global longitudinal strain is -12.5 %. GLS underestimated due to poor endocardial tracking.     09/27/2019 Imaging   1. Multiple small, nonspecific pulmonary  nodules are again noted. The previously noted lung nodules are unchanged in size from previous exam. There is a new lung nodule within the medial right lower lobe which is nonspecific measure 4 mm. Attention at follow-up imaging is recommended. 2. No evidence of metastatic disease within the abdomen or pelvis. 3. Coronary artery calcifications.  The 4.  Aortic Atherosclerosis (ICD10-I70.0).   09/30/2019 Echocardiogram    1. Left ventricular ejection fraction, by estimation, is 60 to 65%. The left ventricle has normal function. The left ventricle has no regional wall motion abnormalities. Left ventricular diastolic parameters are consistent with Grade I diastolic dysfunction (impaired relaxation).  2. Right ventricular systolic function is normal. The right ventricular size is normal. There is normal pulmonary artery systolic pressure. The estimated right ventricular systolic pressure is [id number] mmHg.  3. The mitral valve is normal in structure. No evidence of mitral valve regurgitation. No evidence of mitral stenosis.  4. The aortic valve is normal in structure. Aortic valve regurgitation is trivial. No aortic stenosis is present.  5. The inferior vena cava is normal in size with greater than 50% respiratory variability, suggesting right atrial pressure of 3 mmHg.     01/07/2020 Echocardiogram    1. Normal LV function; grade 1 diastolic dysfunction; mild AI; GLS -21%.  2. Left ventricular ejection fraction, by estimation, is 55 to 60%. The left ventricle has normal function. The left ventricle has no regional wall motion abnormalities. Left ventricular diastolic parameters are consistent with Grade I diastolic dysfunction (impaired relaxation).  3. Right ventricular systolic function is normal. The right ventricular size is normal.  4. The mitral valve is normal in structure. Trivial mitral valve regurgitation. No evidence of mitral stenosis.  5. The aortic valve is tricuspid. Aortic valve regurgitation  is mild. Mild aortic valve sclerosis is present, with no evidence of aortic valve stenosis.  6. The inferior vena cava is normal in size with  greater than 50% respiratory variability, suggesting right atrial pressure of 3 mmHg.     03/27/2020 Imaging   1. Status post hysterectomy. No evidence of recurrent or metastatic disease in the abdomen or pelvis. 2. Multiple small pulmonary nodules in the lung bases are unchanged to the extent that they are included on current examination and remain most likely sequelae of prior infection or inflammation. Attention on follow-up. 3. Aortic Atherosclerosis (ICD10-I70.0).   04/17/2020 Echocardiogram   1. Normal LVEF, normal and unchanged GLS: -20.2%.  2. Left ventricular ejection fraction, by estimation, is 60 to 65%. The left ventricle has normal function. The left ventricle has no regional wall motion abnormalities. There is mild concentric left ventricular hypertrophy. Left ventricular diastolic parameters are consistent with Grade I diastolic dysfunction (impaired relaxation). The average left ventricular global longitudinal strain is -20.2 %. The global longitudinal strain is normal.  3. Right ventricular systolic function is normal. The right ventricular size is normal.  4. The mitral valve is normal in structure. Mild mitral valve regurgitation. No evidence of mitral stenosis.  5. The aortic valve is normal in structure. There is mild calcification of the aortic valve. There is mild thickening of the aortic valve. Aortic valve regurgitation is mild. No aortic stenosis is present.  6. The inferior vena cava is normal in size with greater than 50% respiratory variability, suggesting right atrial pressure of 3 mmHg.   08/02/2020 Echocardiogram   1. Left ventricular ejection fraction, by estimation, is 55 to 60%. The left ventricle has normal function. The left ventricle has no regional wall motion abnormalities. Left ventricular diastolic parameters are  consistent with Grade I diastolic dysfunction (impaired relaxation). The average left ventricular global longitudinal strain is -27.0 %. The global longitudinal strain is normal.  2. Right ventricular systolic function is normal. The right ventricular size is normal. Tricuspid regurgitation signal is inadequate for assessing PA pressure.  3. The mitral valve is grossly normal. Trivial mitral valve regurgitation. No evidence of mitral stenosis.  4. The aortic valve is tricuspid. Aortic valve regurgitation is mild. No aortic stenosis is present.  5. The inferior vena cava is normal in size with greater than 50% respiratory variability, suggesting right atrial pressure of 3 mmHg.   09/19/2020 Imaging   1. Status post hysterectomy. No evidence of recurrent or new metastatic disease within the chest, abdomen, or pelvis. 2. Scattered bilateral tiny pulmonary nodules, unchanged from prior studies and most likely sequela of prior infection or inflammation, recommend attention on follow-up imaging. No new suspicious pulmonary nodules or masses. 3. Subcutaneous edema and small hematoma overlying the posterior gluteal musculature, recommend correlation with recent history of trauma. 4. Mild low-density wall thickening of the gastric antrum, which may represent gastritis. 5. Hepatic steatosis. 6. Extensive sigmoid colonic diverticulosis without findings of acute diverticulitis. 7. Aortic atherosclerosis.  Aortic Atherosclerosis (ICD10-I70.0).   11/20/2020 Echocardiogram    1. Compared to echo from Jan 2022, Global longitudinal strain is less negative. (Previously -27%). Left ventricular ejection fraction, by estimation, is 60 to 65%. The left ventricle has normal function. The left ventricle has no regional wall motion abnormalities. There is mild left ventricular hypertrophy. Left ventricular diastolic parameters are indeterminate.  2. Right ventricular systolic function is normal. The right ventricular size is  normal.  3. The mitral valve is normal in structure. Trivial mitral valve regurgitation.  4. Aortic valve regurgitation is mild. Mild aortic valve sclerosis is present, with no evidence of aortic valve stenosis.  03/12/2021 Echocardiogram   1. Left ventricular ejection fraction, by estimation, is 60 to 65%. Left ventricular ejection fraction by 2D MOD biplane is 63.6 %. The left ventricle has normal function. The left ventricle has no regional wall motion abnormalities. There is mild left ventricular hypertrophy. Left ventricular diastolic parameters are consistent with Grade I diastolic dysfunction (impaired relaxation). The average left ventricular global longitudinal strain is -20.0 %. The global longitudinal strain is normal.  2. Right ventricular systolic function is normal. The right ventricular size is normal. There is normal pulmonary artery systolic pressure. The estimated right ventricular systolic pressure is 0000000 mmHg.  3. The mitral valve is normal in structure. No evidence of mitral valve regurgitation. No evidence of mitral stenosis.  4. The aortic valve is tricuspid. Aortic valve regurgitation is trivial. No aortic stenosis is present. Aortic regurgitation PHT measures 403 msec.  5. The inferior vena cava is normal in size with greater than 50% respiratory variability, suggesting right atrial pressure of 3 mmHg.   04/02/2021 Imaging   Stable small pulmonary nodules scattered throughout the chest, unchanged, without new or suspicious pulmonary nodule.   Three-vessel coronary artery disease.   Aortic Atherosclerosis (ICD10-I70.0).   06/06/2021 Imaging    1. Left ventricular ejection fraction, by estimation, is 60 to 65%. The left ventricle has normal function. The left ventricle has no regional wall motion abnormalities. Left ventricular diastolic parameters are consistent with Grade I diastolic dysfunction (impaired relaxation).  2. Right ventricular systolic function is normal.  The right ventricular size is normal. There is normal pulmonary artery systolic pressure. The estimated right ventricular systolic pressure is [id number] mmHg.  3. Left atrial size was mildly dilated.  4. The mitral valve is normal in structure. Trivial mitral valve regurgitation.  5. The aortic valve is tricuspid. Aortic valve regurgitation is mild. No aortic stenosis is present.  6. The inferior vena cava is normal in size with greater than 50% respiratory variability, suggesting right atrial pressure of 3 mmHg.  7. GLS attempted but not reported due to suboptimal tracking.     09/25/2021 Echocardiogram    1. Left ventricular ejection fraction, by estimation, is 60 to 65%. Left ventricular ejection fraction by 3D volume is 56 %. The left ventricle has normal function. The left ventricle has no regional wall motion abnormalities. Left ventricular diastolic parameters were normal.  2. Right ventricular systolic function is normal. The right ventricular size is normal. There is normal pulmonary artery systolic pressure.  3. The mitral valve is normal in structure. Trivial mitral valve regurgitation. No evidence of mitral stenosis.  4. The aortic valve is tricuspid. Aortic valve regurgitation is mild. No aortic stenosis is present.  5. The inferior vena cava is normal in size with greater than 50% respiratory variability, suggesting right atrial pressure of 3 mmHg.   10/15/2021 Imaging   1. Stable examination post prior hysterectomy without evidence of local recurrence or metastatic disease within the chest, abdomen, or pelvis. 2. Unchanged small bilateral pulmonary nodules, likely benign. 3. Left-sided colonic diverticulosis without findings of acute diverticulitis. 4. Aortic Atherosclerosis (ICD10-I70.0).       12/31/2021 Echocardiogram    1. Left ventricular ejection fraction, by estimation, is 65 to 70%. The left ventricle has normal function. The left ventricle has no regional wall motion  abnormalities. Left ventricular diastolic parameters are consistent with Grade I diastolic dysfunction (impaired relaxation).  2. Right ventricular systolic function is normal. The right ventricular size is normal. There is normal pulmonary artery  systolic pressure. The estimated right ventricular systolic pressure is [id number] mmHg.  3. The mitral valve is grossly normal. Trivial mitral valve regurgitation. No evidence of mitral stenosis.  4. The aortic valve is tricuspid. Aortic valve regurgitation is mild. No aortic stenosis is present.  5. The inferior vena cava is normal in size with greater than 50% respiratory variability, suggesting right atrial pressure of 3 mmHg.     04/02/2022 -  Chemotherapy   Patient is on Treatment Plan : BREAST MAINTENANCE Trastuzumab IV (6) or SQ (600) D1 q21d X 11 Cycles     04/04/2022 Echocardiogram    1. Left ventricular ejection fraction, by estimation, is 65 to 70%. The left ventricle has normal function. The left ventricle has no regional wall motion abnormalities. Left ventricular diastolic parameters are consistent with Grade I diastolic dysfunction (impaired relaxation).  2. Right ventricular systolic function is normal. The right ventricular size is normal.  3. Left atrial size was mildly dilated.  4. The mitral valve is normal in structure. Trivial mitral valve regurgitation. No evidence of mitral stenosis.  5. The aortic valve is tricuspid. There is mild calcification of the aortic valve. Aortic valve regurgitation is mild. No aortic stenosis is present. Aortic regurgitation PHT measures 613 msec. Aortic valve area, by VTI measures 1.70 cm. Aortic valve mean gradient measures 3.0 mmHg. Aortic valve Vmax measures 1.15 m/s.  6. The inferior vena cava is normal in size with greater than 50% respiratory variability, suggesting right atrial pressure of 3 mmHg.   07/02/2022 Echocardiogram   1. Left ventricular ejection fraction, by estimation, is 60 to 65%. Left  ventricular ejection fraction by 3D volume is 58 %. The left ventricle has normal function. The left ventricle has no regional wall motion abnormalities. Left ventricular diastolic parameters are consistent with Grade I diastolic dysfunction (impaired relaxation). The average left ventricular global longitudinal strain is -20.7 %. The global longitudinal strain is normal.  2. Right ventricular systolic function is normal. The right ventricular size is normal. Tricuspid regurgitation signal is inadequate for assessing PA pressure.  3. Left atrial size was mildly dilated.  4. The mitral valve is normal in structure. Trivial mitral valve regurgitation. No evidence of mitral stenosis.  5. The aortic valve is grossly normal. Aortic valve regurgitation is mild. No aortic stenosis is present.  6. The inferior vena cava is normal in size with greater than 50% respiratory variability, suggesting right atrial pressure of 3 mmHg.   Metastasis to lymph nodes ([place])  10/14/2018 Initial Diagnosis   Metastasis to lymph nodes ([place])   10/21/2018 - 02/24/2019 Chemotherapy   The patient had palonosetron (ALOXI) injection 0.25 mg, 0.25 mg, Intravenous,  Once, 7 of 7 cycles Administration: 0.25 mg (10/21/2018), 0.25 mg (11/11/2018), 0.25 mg (12/02/2018), 0.25 mg (12/23/2018), 0.25 mg (01/13/2019), 0.25 mg (02/03/2019), 0.25 mg (02/24/2019) CARBOplatin (PARAPLATIN) 310 mg in sodium chloride 0.9 % 250 mL chemo infusion, 310 mg, Intravenous,  Once, 7 of 7 cycles Dose modification: 300 mg (original dose 311.5 mg, Cycle 4, Reason: Dose not tolerated) Administration: 310 mg (10/21/2018), 300 mg (11/11/2018), 300 mg (12/02/2018), 300 mg (12/23/2018), 300 mg (01/13/2019), 300 mg (02/03/2019), 300 mg (02/24/2019) PACLitaxel (TAXOL) 222 mg in sodium chloride 0.9 % 250 mL chemo infusion (> 89m/m2), 135 mg/m2 = 222 mg, Intravenous,  Once, 7 of 7 cycles Administration: 222 mg (10/21/2018), 222 mg (11/11/2018), 222 mg (12/02/2018), 222 mg (12/23/2018), 222 mg  (01/13/2019), 222 mg (02/03/2019), 222 mg (02/24/2019) fosaprepitant (EMEND) 150 mg, dexamethasone (  DECADRON) 12 mg in sodium chloride 0.9 % 145 mL IVPB, , Intravenous,  Once, 7 of 7 cycles Administration:  (10/21/2018),  (11/11/2018),  (12/02/2018),  (12/23/2018),  (01/13/2019),  (02/03/2019),  (02/24/2019)  for chemotherapy treatment.    04/02/2022 -  Chemotherapy   Patient is on Treatment Plan : BREAST MAINTENANCE Trastuzumab IV (6) or SQ (600) D1 q21d X 11 Cycles       Interval History: Doing well.  Denies any abdominal or pelvic pain.  Denies any vaginal bleeding or discharge.  Reports regular bowel bladder function.  Continues on [name] for maintenance therapy in the setting of HER2+ recurrent uterine cancer.  Denies any symptoms related to [name].   Past Medical/Surgical History: Past Medical History:  Diagnosis Date   Aortic atherosclerosis ([place])    Arthritis    Blood transfusion without reported diagnosis    Breast cancer ([place])     LEFT, '82-left breast cancer-surgery only   Cataract    removed both eyes   Complication of anesthesia    nausea   Diverticulosis    Esophageal stricture    Family history of stomach cancer    GERD (gastroesophageal reflux disease)    Hemorrhoids    Hiatal hernia    High triglycerides    Hypercholesteremia    Hypertension    Hypothyroidism    Osteoarthritis    PONV (postoperative nausea and vomiting)    Uterine cancer ([place])     Past Surgical History:  Procedure Laterality Date   COLONOSCOPY     EUS N/A 01/07/2013   Procedure: UPPER ENDOSCOPIC ULTRASOUND (EUS) LINEAR;  Surgeon: Milus Banister, MD;  Location: [facility] END[facility];  Service: Endoscopy;  Laterality: N/A;   EUS N/A 01/05/2015   Procedure: UPPER ENDOSCOPIC ULTRASOUND (EUS) LINEAR;  Surgeon: Milus Banister, MD;  Location: [facility] ENDOS[facility];  Service: Endoscopy;  Laterality: N/A;   FOOT SURGERY Left 2009-2010   x2. Surgery in 2011   IR IMAGING GUIDED PORT INSERTION  10/19/2018   LEFT HEART  CATHETERIZATION WITH CORONARY ANGIOGRAM N/A 01/24/2014   Procedure: LEFT HEART CATHETERIZATION WITH CORONARY ANGIOGRAM;  Surgeon: Peter M Martinique, MD;  Location: [facility] CATH LAB;  Service: Cardiovascular;  Laterality: N/A;   LUMBAR DISC SURGERY  01-01-13   MASTECTOMY, RADICAL Left 1982   OVARY SURGERY  1972-1973   ROBOTIC ASSISTED TOTAL HYSTERECTOMY WITH BILATERAL SALPINGO OOPHERECTOMY N/A 10/22/2016   Procedure: XI ROBOTIC ASSISTED TOTAL HYSTERECTOMY WITH RIGHT SALPINGO OOPHORECTOMY; INJECTION OF GREEN DYE WITH EXCISION OF BILATERAL PELVIC LYMPH NODES;  Surgeon: Janie Morning, MD;  Location: [facility] OR[facility];  Service: Gynecology;  Laterality: N/A;   ROTATOR CUFF REPAIR Left    SENTINEL NODE BIOPSY N/A 10/22/2016   Procedure: SENTINEL NODE BIOPSY;  Surgeon: Janie Morning, MD;  Location: [facility];  Service: Gynecology;  Laterality: N/A;   SIMPLE MASTECTOMY Left 1982   TOTAL KNEE ARTHROPLASTY Bilateral 2009-2010   x2   UPPER GASTROINTESTINAL ENDOSCOPY      Family History  Problem Relation Age of Onset   Heart disease Mother    Other Mother        blood disease   Heart attack Father    Colon polyps Sister    Heart attack Brother    Colon polyps Brother    Stomach cancer Maternal Aunt    Colon cancer Neg Hx    Esophageal cancer Neg Hx    Rectal cancer Neg Hx    Pancreatic cancer Neg Hx     Social History  Socioeconomic History   Marital status: Widowed    Spouse name: Not on file   Number of children: 3   Years of education: Not on file   Highest education level: Not on file  Occupational History   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   Drug use: No   Sexual activity: Never  Other Topics Concern    Not on file  Social History Narrative   Not on file   Social Determinants of Health   Financial Resource Strain: Not on file  Food Insecurity: Not on file  Transportation Needs: Not on file  Physical Activity: Not on file  Stress: Not on file  Social Connections: Not on file    Current Medications:  Current Outpatient Medications:    acetaminophen (TYLENOL) 500 MG tablet, Take 1,000 mg by mouth every 6 (six) hours as needed., Disp: , Rfl:    aspirin EC 81 MG tablet, Take 81 mg by mouth daily., Disp: , Rfl:    atorvastatin (LIPITOR) 20 MG tablet, Take 20 mg by mouth every evening. , Disp: , Rfl:    Calcium Carbonate-Vitamin D (CALCIUM-VITAMIN D) 500-200 MG-UNIT per tablet, Take 1 tablet by mouth daily., Disp: , Rfl:    estradiol (ESTRACE) 0.1 MG/GM vaginal cream, PLACE 1 APPLICATORFUL VAGINALLY 3 TIMES A WEEK, Disp: 42.5 g, Rfl: 12   levothyroxine (SYNTHROID, LEVOTHROID) 100 MCG tablet, Take 100 mcg by mouth daily before breakfast. , Disp: , Rfl:    lidocaine-prilocaine (EMLA) cream, Apply 1 Application topically as needed., Disp: 30 g, Rfl: 3   losartan-hydrochlorothiazide (HYZAAR) 100-25 MG tablet, , Disp: , Rfl:    metoprolol succinate (TOPROL-XL) 25 MG 24 hr tablet, Take 25 mg by mouth daily., Disp: , Rfl:    Multiple Vitamin (MULTIVITAMIN WITH MINERALS) TABS, Take 1 tablet by mouth daily., Disp: , Rfl:    valACYclovir (VALTREX) 1000 MG tablet, valacyclovir 1 gram tablet, Disp: , Rfl:   Review of Systems: Denies appetite changes, fevers, chills, fatigue, unexplained weight changes. Denies hearing loss, neck lumps or masses, mouth sores, ringing in ears or voice changes. Denies cough or wheezing.  Denies shortness of breath. Denies chest pain or palpitations. Denies leg swelling. Denies abdominal distention, pain, blood in stools, constipation, diarrhea, nausea, vomiting, or early satiety. Denies pain with intercourse, dysuria, frequency, hematuria or incontinence. Denies hot  flashes, pelvic pain, vaginal bleeding or vaginal discharge.   Denies joint pain, back pain or muscle pain/cramps. Denies itching, rash, or wounds. Denies dizziness, headaches, numbness or seizures. Denies swollen lymph nodes or glands, denies easy bruising or bleeding. Denies anxiety, depression, confusion, or decreased concentration.  Physical Exam: BP (!) 168/62 (Patient Position: Sitting) Comment: informed Dr Berline Lopes  Pulse 67   Temp 97.9 F (36.6 C) (Oral)   Resp 14   Wt 134 lb (60.8 kg)   SpO2 97%   BMI 24.51 kg/m  General: Alert, oriented, no acute distress. HEENT: Normocephalic, atraumatic, sclera anicteric. Chest: Clear to auscultation bilaterally.  No wheezes or rhonchi. Cardiovascular: Regular rate and rhythm, no murmurs. Abdomen: soft, nontender.  Normoactive bowel sounds.  No masses or hepatosplenomegaly appreciated.  Well-healed scar.  Extremities: Grossly normal range of motion.  Warm, well perfused.  No edema bilaterally. Skin: No rashes or lesions noted. Lymphatics: No cervical, supraclavicular, or inguinal adenopathy. GU: Normal appearing external genitalia without erythema, excoriation, or lesions.  Speculum exam reveals mildly atrophic vaginal mucosa, cuff intact, no lesions, bleeding, or discharge.  Bimanual exam reveals cuff intact and smooth, no nodularity or masses.  Rectovaginal exam confirms findings.  Laboratory & Radiologic Studies: ECHO 07/01/22: LVEF 60-65%.  Left ventricle with normal function.  Left ventricle diastolic parameters are consistent with grade 1 diastolic dysfunction.  No significant change from prior study.  Assessment & Plan: Vicki Cervantes is a 82 y.o. 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  PET imaging notable for hypermetabolic activity and right axillary lymph nodes. Of note the breast cancer at age 43 was left-sided and a reducing mastectomy was performed on the right.  Vaginal biopsy and ultrasound-guided biopsy  of the right axillary node were positive for metastatic uterine serous cancer. S/p taxol/carbo, herceptin completed 02/2019, has been on maintenance herceptin since.  Last imaging 10/2021 with no evidence of metastatic or recurrent disease.  Patient is overall doing very well, tolerating [name] maintenance without symptoms, and remains NED both on exam and imaging.  She will continue with [name] infusions and visits with Dr. Alvy Bimler.  I will plan to see her every 6 months for appointment and pelvic exam.  We reviewed signs and symptoms that would be concerning for cancer recurrence and I stressed the importance of calling if she develops any of these.  20 minutes of total time was spent for this patient encounter, including preparation, face-to-face counseling with the patient and coordination of care, and documentation of the encounter.  Jeral Pinch, MD  Division of Gynecologic Oncology  Department of Obstetrics and Gynecology  [facility]

## 2022-09-03 ENCOUNTER — Encounter: Payer: Self-pay | Admitting: Hematology and Oncology

## 2022-09-03 ENCOUNTER — Inpatient Hospital Stay: Payer: Medicare HMO

## 2022-09-03 ENCOUNTER — Other Ambulatory Visit: Payer: Self-pay

## 2022-09-03 ENCOUNTER — Inpatient Hospital Stay: Payer: Medicare HMO | Admitting: Hematology and Oncology

## 2022-09-03 VITALS — BP 154/62 | HR 62 | Temp 97.7°F | Resp 19

## 2022-09-03 VITALS — BP 148/71 | HR 72 | Temp 97.9°F | Resp 18 | Ht 62.0 in | Wt 136.6 lb

## 2022-09-03 DIAGNOSIS — Z90710 Acquired absence of both cervix and uterus: Secondary | ICD-10-CM | POA: Diagnosis not present

## 2022-09-03 DIAGNOSIS — C541 Malignant neoplasm of endometrium: Secondary | ICD-10-CM

## 2022-09-03 DIAGNOSIS — Z5112 Encounter for antineoplastic immunotherapy: Secondary | ICD-10-CM | POA: Diagnosis not present

## 2022-09-03 DIAGNOSIS — Z7189 Other specified counseling: Secondary | ICD-10-CM

## 2022-09-03 DIAGNOSIS — C773 Secondary and unspecified malignant neoplasm of axilla and upper limb lymph nodes: Secondary | ICD-10-CM

## 2022-09-03 DIAGNOSIS — N183 Chronic kidney disease, stage 3 unspecified: Secondary | ICD-10-CM | POA: Diagnosis not present

## 2022-09-03 DIAGNOSIS — D630 Anemia in neoplastic disease: Secondary | ICD-10-CM | POA: Diagnosis not present

## 2022-09-03 DIAGNOSIS — D631 Anemia in chronic kidney disease: Secondary | ICD-10-CM | POA: Diagnosis not present

## 2022-09-03 DIAGNOSIS — Z79899 Other long term (current) drug therapy: Secondary | ICD-10-CM | POA: Diagnosis not present

## 2022-09-03 DIAGNOSIS — Z7982 Long term (current) use of aspirin: Secondary | ICD-10-CM | POA: Diagnosis not present

## 2022-09-03 DIAGNOSIS — I1310 Hypertensive heart and chronic kidney disease without heart failure, with stage 1 through stage 4 chronic kidney disease, or unspecified chronic kidney disease: Secondary | ICD-10-CM | POA: Diagnosis not present

## 2022-09-03 LAB — BASIC METABOLIC PANEL - CANCER CENTER ONLY
Anion gap: 8 (ref 5–15)
BUN: 31 mg/dL — ABNORMAL HIGH (ref 8–23)
CO2: 26 mmol/L (ref 22–32)
Calcium: 8.9 mg/dL (ref 8.9–10.3)
Chloride: 102 mmol/L (ref 98–111)
Creatinine: 1.3 mg/dL — ABNORMAL HIGH (ref 0.44–1.00)
GFR, Estimated: 41 mL/min — ABNORMAL LOW (ref >60)
Glucose, Bld: 94 mg/dL (ref 70–99)
Potassium: 4 mmol/L (ref 3.5–5.1)
Sodium: 136 mmol/L (ref 135–145)

## 2022-09-03 LAB — CBC WITH DIFFERENTIAL (CANCER CENTER ONLY)
Abs Immature Granulocytes: 0.03 10*3/uL (ref 0.00–0.07)
Basophils Absolute: 0.1 10*3/uL (ref 0.0–0.1)
Basophils Relative: 1 %
Eosinophils Absolute: 0.3 10*3/uL (ref 0.0–0.5)
Eosinophils Relative: 4 %
HCT: 30.3 % — ABNORMAL LOW (ref 36.0–46.0)
Hemoglobin: 10.7 g/dL — ABNORMAL LOW (ref 12.0–15.0)
Immature Granulocytes: 1 %
Lymphocytes Relative: 28 %
Lymphs Abs: 1.7 10*3/uL (ref 0.7–4.0)
MCH: 32.2 pg (ref 26.0–34.0)
MCHC: 35.3 g/dL (ref 30.0–36.0)
MCV: 91.3 fL (ref 80.0–100.0)
Monocytes Absolute: 0.5 10*3/uL (ref 0.1–1.0)
Monocytes Relative: 9 %
Neutro Abs: 3.5 10*3/uL (ref 1.7–7.7)
Neutrophils Relative %: 57 %
Platelet Count: 230 10*3/uL (ref 150–400)
RBC: 3.32 MIL/uL — ABNORMAL LOW (ref 3.87–5.11)
RDW: 12.1 % (ref 11.5–15.5)
WBC Count: 6.1 10*3/uL (ref 4.0–10.5)
nRBC: 0 % (ref 0.0–0.2)

## 2022-09-03 MED ORDER — SODIUM CHLORIDE 0.9% FLUSH
10.0000 mL | Freq: Once | INTRAVENOUS | Status: AC
  Administered 2022-09-03: 10 mL

## 2022-09-03 MED ORDER — HEPARIN SOD (PORK) LOCK FLUSH 100 UNIT/ML IV SOLN
500.0000 [IU] | Freq: Once | INTRAVENOUS | Status: AC | PRN
  Administered 2022-09-03: 500 [IU]

## 2022-09-03 MED ORDER — SODIUM CHLORIDE 0.9 % IV SOLN: Freq: Once | INTRAVENOUS | Status: AC

## 2022-09-03 MED ORDER — TRASTUZUMAB-ANNS CHEMO 150 MG IV SOLR
6.0000 mg/kg | Freq: Once | INTRAVENOUS | Status: AC
  Administered 2022-09-03: 357 mg via INTRAVENOUS
  Filled 2022-09-03: qty 17

## 2022-09-03 MED ORDER — DIPHENHYDRAMINE HCL 25 MG PO CAPS
25.0000 mg | ORAL_CAPSULE | Freq: Once | ORAL | Status: AC
  Administered 2022-09-03: 25 mg via ORAL
  Filled 2022-09-03: qty 1

## 2022-09-03 MED ORDER — SODIUM CHLORIDE 0.9% FLUSH
10.0000 mL | INTRAVENOUS | Status: DC | PRN
  Administered 2022-09-03: 10 mL

## 2022-09-03 MED ORDER — ACETAMINOPHEN 325 MG PO TABS
650.0000 mg | ORAL_TABLET | Freq: Once | ORAL | Status: AC
  Administered 2022-09-03: 650 mg via ORAL
  Filled 2022-09-03: qty 2

## 2022-09-03 NOTE — Assessment & Plan Note (Signed)
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

## 2022-09-03 NOTE — Progress Notes (Signed)
[place] OFFICE PROGRESS NOTE  Patient Care Team: Burnard Bunting, [name]  ASSESSMENT & PLAN:  Endometrial cancer [facility]) Her last imaging study from April 2023 showed no evidence of disease She has no signs of cancer recurrence after all these years Plan CT imaging once a year only She will continue trastuzumab indefinitely Her next echocardiogram will be due April with her CT  CKD (chronic kidney disease), stage III ([place]) Renal function is stable. Monitor closely  Anemia in neoplastic disease She has multifactorial anemia, anemia secondary to prior chemotherapy as well as chronic kidney disease Anemia is improving/stable Recent vitamin B12 level was adequate She is not symptomatic Observe only  Orders Placed This Encounter  Procedures   CT CHEST ABDOMEN PELVIS W CONTRAST    Standing Status:   Future    Standing Expiration Date:   09/04/2023    Order Specific Question:   Preferred imaging location?    Answer:   [facility]    Order Specific Question:   Radiology Contrast Protocol - do NOT remove file path    Answer:   [email or web address]   CMP ([place] only)    Standing Status:   Future    Standing Expiration Date:   09/04/2023   CBC with Differential (Cancer Center Only)    Standing Status:   Future    Standing Expiration Date:   09/04/2023   ECHOCARDIOGRAM COMPLETE    Standing Status:   Future    Standing Expiration Date:   09/04/2023    Order Specific Question:   Where should this test be performed    Answer:   [place]    Order Specific Question:   Perflutren DEFINITY (image enhancing agent) should be administered unless hypersensitivity or allergy exist    Answer:   Administer Perflutren    Order Specific Question:   Reason for exam-Echo    Answer:   Chemo  [id number]    All questions were answered. The patient knows to call the clinic with any problems, questions or  concerns. The total time spent in the appointment was 25 minutes encounter with patients including review of chart and various tests results, discussions about plan of care and coordination of care plan   [name], MD 09/03/2022 9:18 AM  INTERVAL HISTORY: Please see below for problem oriented charting. she returns for treatment follow-up on maintenance trastuzumab She tolerated treatment well No clinical signs or symptoms of congestive heart failure  REVIEW OF SYSTEMS:   Constitutional: Denies fevers, chills or abnormal weight loss Eyes: Denies blurriness of vision Ears, nose, mouth, throat, and face: Denies mucositis or sore throat Respiratory: Denies cough, dyspnea or wheezes Cardiovascular: Denies palpitation, chest discomfort or lower extremity swelling Gastrointestinal:  Denies nausea, heartburn or change in bowel habits Skin: Denies abnormal skin rashes Lymphatics: Denies new lymphadenopathy or easy bruising Neurological:Denies numbness, tingling or new weaknesses Behavioral/Psych: Mood is stable, no new changes  All other systems were reviewed with the patient and are negative.  I have reviewed the past medical history, past surgical history, social history and family history with the patient and they are unchanged from previous note.  ALLERGIES:  has No Known Allergies.  MEDICATIONS:  Current Outpatient Medications  Medication Sig Dispense Refill   acetaminophen (TYLENOL) 500 MG tablet Take 1,000 mg by mouth every 6 (six) hours as needed.     aspirin EC 81 MG tablet Take 81 mg by mouth daily.  atorvastatin (LIPITOR) 20 MG tablet Take 20 mg by mouth every evening.      Calcium Carbonate-Vitamin D (CALCIUM-VITAMIN D) 500-200 MG-UNIT per tablet Take 1 tablet by mouth daily.     estradiol (ESTRACE) 0.1 MG/GM vaginal cream PLACE 1 APPLICATORFUL VAGINALLY 3 TIMES A WEEK 42.5 g 12   levothyroxine (SYNTHROID, LEVOTHROID) 100 MCG tablet Take 100 mcg by mouth daily before  breakfast.      lidocaine-prilocaine (EMLA) cream Apply 1 Application topically as needed. 30 g 3   losartan-hydrochlorothiazide (HYZAAR) 100-25 MG tablet      metoprolol succinate (TOPROL-XL) 25 MG 24 hr tablet Take 25 mg by mouth daily.     Multiple Vitamin (MULTIVITAMIN WITH MINERALS) TABS Take 1 tablet by mouth daily.     valACYclovir (VALTREX) 1000 MG tablet valacyclovir 1 gram tablet     No current facility-administered medications for this visit.   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 Her cancer was treated with surgery but no radiation or chemotherapy.   Serous endometrial cancer, MSI stable ER positive, PR neg, Her2/neu 3+      Endometrial cancer ([place])  08/29/2016 Pathology Results   Endometrium, biopsy - HIGH GRADE ENDOMETRIAL CARCINOMA, SEE COMMENT. Microscopic Comment The sections show multiple fragments of adenocarcinoma displaying glandular and papillary patterns associated with high grade cytomorphology characterized by nuclear pleomorphism, prominent nucleoli and brisk mitosis. Immunohistochemical stains show that the tumor cells are positive for vimentin, p16, p53 with increased Ki-67 expression. Estrogen and progesterone receptor stains show patchy weak positivity. No significant positivity is seen with CEA. The findings are consistent with high grade  endometrial carcinoma and the overall morphology and phenotypic features favor serous carcinoma.   08/29/2016 Initial Diagnosis   She presented with postmenopausal bleeding   10/03/2016 Imaging   CT C/A/P 09/2016 IMPRESSION: 1. Thickening of the endometrial canal up to 19 mm in fundus, presumably corresponding to the patient's reported endometrial carcinoma. 2. Multiple tiny pulmonary nodules scattered throughout the lungs bilaterally measuring 4 mm or less in size. Nodules of this size are typically considered statistically likely benign. In the setting of known primary malignancy, metastatic disease to the lungs is not excluded, but is not strongly favored on today's examination. Attention on followup studies is recommended to ensure the stability or resolution of these nodules. 3. Subcentimeter low-attenuation lesion in the central aspect of segment 8 of the liver is too small to characterize. This is statistically likely a tiny cyst, but warrants attention on follow-up studies to exclude the possibility of a solitary hepatic metastasis. 4. 1.5 x 1.5 x 1.7 cm well-circumscribed lesion in the proximal stomach. This is of uncertain etiology and significance, and could represent a benign gastric polyp, however, further evaluation with nonemergent endoscopy is suggested in the near future for further  evaluation. 5. **An incidental finding of potential clinical significance has been found. 1.1 x 1.6 cm thyroid nodule in the inferior aspect of the right lobe of the thyroid gland. Follow-up evaluation with nonemergent thyroid ultrasound is recommended in the near future to better evaluate this finding. This recommendation follows ACR consensuss guidelines: Managing Incidental Thyroid Nodules Detected on Imaging: White Paper of the ACR Incidental Thyroid Findings Committee. J Am Coll Radiol 2015;12(2):143-150.** 6. Aortic atherosclerosis, in addition to left anterior descending coronary artery disease    10/22/2016 Surgery   Robotic assisted total hysterectomy, BSO and bilateral pelvic lymphadenectomy  Final pathology revealed a 3cm polyp containing serous carcinoma but with no myometrial invasion, no LVSI and negative nodes.  Stage IA Uterine serous cancer   10/22/2016 Pathology Results   1. Lymph nodes, regional resection, right pelvic - SIX BENIGN LYMPH NODES (0/6). 2. Lymph nodes, regional resection, left pelvic - SEVEN BENIGN LYMPH NODES (0/7). 3. Uterus +/- tubes/ovaries, neoplastic, with right ovary and fallopian tube ENDOMYOMETRIUM - SEROUS CARCINOMA ARISING WITHIN AN ENDOMETRIAL POLYP - NO MYOMETRIAL INVASION IDENTIFIED - ADENOMYOSIS - LEIOMYOMA (1 CM) - SEE ONCOLOGY TABLE AND COMMENT CERVIX - CARCINOMA FOCALLY INVOLVES ENDOCERVICAL GLANDS - NABOTHIAN CYSTS RIGHT ADNEXA - BENIGN OVARY AND FALLOPIAN TUBE - NO CARCINOMA IDENTIFIED 4. Cul-de-sac biopsy - MESOTHELIAL HYPERPLASIA Microscopic Comment 3. ONCOLOGY TABLE-UTERUS, CARCINOMA OR CARCINOSARCOMA Specimen: Uterus, right fallopian tube and ovary Procedure: Total hysterectomy and right salpingo-oophorectomy Lymph node sampling performed: Bilateral pelvic regional resection Specimen integrity: Intact Maximum tumor size: 3 cm (polyp) Histologic type: Serous carcinoma Grade: High grade Myometrial invasion: Not identified Cervical stromal involvement: No, focal endocervical gland involvement Extent of involvement of other organs: Not identified Lymph - vascular invasion: Not identified Peritoneal washings: N/A Lymph nodes: Examined: 0 Sentinel 13 Non-sentinel 13 Total Lymph nodes with metastasis: 0 Isolated tumor cells (< 0.2 mm): 0 Micrometastasis: (> 0.2 mm and < 2.0 mm): 0 Macrometastasis: (> 2.0 mm): 0 Extracapsular extension: N/A Pelvic lymph nodes: 0 involved of 13 lymph nodes. Para-aortic lymph nodes: No para-aortic nodes submitted TNM code: pT1a, pNX FIGO Stage (based on pathologic findings, needs  clinical correlation): IA Comment: Immunohistochemistry for cytokeratin AE1/AE3 is performed on all of the lymph nodes (parts 1 & 2) and no metastatic carcinoma is identified.   07/04/2017 Genetic Testing   The patient had genetic testing due to a personal history of breast and uterine cancer, and a family history of stomach cancer.  The Multi-Cancer Panel was ordered. The Multi-Cancer Panel offered by [facility] includes sequencing and/or deletion duplication testing of the following 83 genes: ALK, APC, ATM, AXIN2,BAP1,  BARD1, BLM, BMPR1A, BRCA1, BRCA2, BRIP1, CASR, CDC73, CDH1, CDK4, CDKN1B, CDKN1C, CDKN2A (p14ARF), CDKN2A (p16INK4a), CEBPA, CHEK2, CTNNA1, DICER1, DIS3L2, EGFR (c.2369C>T, p.Thr790Met variant only), EPCAM (Deletion/duplication testing only), FH, FLCN, GATA2, GPC3, GREM1 (Promoter region deletion/duplication testing only), HOXB13 (c.251G>A, p.Gly84Glu), HRAS, KIT, MAX, MEN1, MET, MITF (c.952G>A, p.Glu318Lys variant only), MLH1, MSH2, MSH3, MSH6, MUTYH, NBN, NF1, NF2, NTHL1, PALB2, PDGFRA, PHOX2B, PMS2, POLD1, POLE, POT1, PRKAR1A, PTCH1, PTEN, RAD50, RAD51C, RAD51D, RB1, RECQL4, RET, RUNX1, SDHAF2, SDHA (sequence changes only), SDHB, SDHC, SDHD, SMAD4, SMARCA4, SMARCB1, SMARCE1, STK11, SUFU, TERC, TERT, TMEM127, TP53, TSC1, TSC2, VHL, WRN and WT1.   Results: No pathogenic variants were identified.  A variant of uncertain significance in the gene APC was identified.  c.791A>G (p.Gln264Arg).  The date of this test report is 07/04/2017.     Genetic Testing   Patient has genetic testing done for MSI. Results  revealed patient is MSI stable on surgical pathology from 10/22/2016.    12/03/2017 Imaging   CT Chest/Abd/Pelvis to follow pulmonary nodule and gastric mass IMPRESSION: 1. Stable CT of the chest. Small pulmonary nodules are unchanged when compared with previous exam. 2. No new findings identified. 3. Subcentimeter low-attenuation lesions within the liver are remain too small to  characterize but are stable from prior exam. 4. Persistent indeterminate low-attenuation structure within the proximal stomach is unchanged measuring 1.4 cm. Correlation with direct visualization is advised   06/30/2018 Imaging   CT CHEST Lungs/Pleura: Stable scattered sub-cm pulmonary nodules are again seen bilaterally and are stable compared to previous studies. No new or enlarging pulmonary nodules or masses identified. No evidence of pulmonary infiltrate or pleural effusion.   08/19/2018 Echocardiogram   [place] is done in [place]: EF 60-65%. Mild impaired relaxation. (report scanned)   09/17/2018 Relapse/Recurrence   Presented with c/o vagina discharge.  Lesion noted at the left vaginal apex 77m lesion removed Path c/w high grade serous cancer   09/17/2018 Pathology Results   Vagina, biopsy, left apex - HIGH GRADE SEROUS CARCINOMA.   09/28/2018 PET scan   1. Two hypermetabolic axial lymph nodes. Unusual site for metastatic endometrial carcinoma however the activity is more intense than typically seen in reactive adenopathy. Suggest ultrasound-guided percutaneous biopsy of the larger RIGHT axial lymph node 2. No evidence of local recurrence at the vaginal cuff.  3. No metastatic adenopathy in the abdomen or pelvis. 4. Stable small pulmonary nodules   10/09/2018 Pathology Results   Lymph node, needle/core biopsy, right axilla - METASTATIC CARCINOMA, SEE COMMENT. Microscopic Comment The carcinoma appears high grade. Immunohistochemistry is positive for cytokeratin 7, PAX-8, and ER. Cytokeratin 20, CDX-2, PR, and GATA-3 are negative. The findings along with the history are consistent with a gynecologic primary.    10/09/2018 Procedure   Ultrasound-guided core biopsies of a right axillary lymph node.   10/14/2018 Cancer Staging   Staging form: Corpus Uteri - Carcinoma and Carcinosarcoma, AJCC 8th Edition - Clinical: Stage IVB (cT1, cN0, pM1) - Signed by GHeath Lark MD on 10/14/2018   10/19/2018  Imaging   Placement of single lumen port a cath via right internal jugular vein. The catheter tip lies at the cavo-atrial junction. A power injectable port a cath was placed and is ready for immediate use.    10/21/2018 -  Chemotherapy   The patient had carboplatin and taxol for treatment   11/11/2018 - 03/12/2022 Chemotherapy   Patient is on Treatment Plan : BREAST Trastuzumab q21d     12/21/2018 PET scan   1. Interval resolution of hypermetabolic right axillary lymph nodes. No metabolic findings highly suspicious for recurrent metastatic disease. 2. New mild hypermetabolism within a borderline prominent portacaval lymph node, nonspecific. While a reactive node is favored, a lymph node metastasis cannot be entirely excluded. Suggest attention to this lymph node on follow-up PET-CT in 3-6 months. 3. Nonspecific new hypermetabolism at the ileocecal valve, more likely physiologic given absence of CT correlate. 4. Scattered subcentimeter pulmonary nodules are all stable and below PET resolution, more likely benign, continued CT surveillance advised. 5. Chronic findings include: Aortic Atherosclerosis (ICD10-I70.0). Marked diffuse colonic diverticulosis. Coronary atherosclerosis.     12/28/2018 Echocardiogram   1. The left ventricle has normal systolic function with an ejection fraction of 60-65%. The cavity size was normal. Left ventricular diastolic Doppler parameters are consistent with impaired relaxation.  2. GLS recorded as -10.7 but LV appears hyperdynamic and tracking of  endocardium appears poor.  3. The right ventricle has normal systolic function. The cavity was normal. There is no increase in right ventricular wall thickness.  4. Mild thickening of the mitral valve leaflet.  5. The aortic valve was not well visualized. Mild thickening of the aortic valve. Aortic valve regurgitation is trivial by color flow Doppler.   03/23/2019 Imaging   PET 1. No findings of hypermetabolic  residual/recurrent or metastatic disease. 2. Similar low-level hypermetabolism within normal sized portocaval and right inguinal nodes, favored to be reactive. 3. Ongoing stability of small bilateral pulmonary nodules, favored to be benign. Below PET resolution. 4. Coronary artery atherosclerosis. Aortic Atherosclerosis   03/26/2019 Echocardiogram   1. The left ventricle has normal systolic function, with an ejection fraction of 55-60%. The cavity size was normal. Left ventricular diastolic Doppler parameters are consistent with impaired relaxation.  2. The right ventricle has normal systolic function. The cavity was normal.  3. The mitral valve is abnormal. Mild thickening of the mitral valve leaflet. There is mild mitral annular calcification present.  4. The tricuspid valve is grossly normal.  5. The aortic valve is tricuspid. Mild calcification of the aortic valve. No stenosis of the aortic valve.  6. The aorta is normal unless otherwise noted.  7. Normal LV systolic function; grade 1 diastolic dysfunction; [id number].   06/30/2019 Imaging   Chest Impression:   1. No evidence thoracic metastasis. 2. Stable small benign-appearing pulmonary nodules   Abdomen / Pelvis Impression:   1. No evidence of metastatic disease in the abdomen pelvis. 2.  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 3.  Aortic Atherosclerosis (ICD10-I70.0).   06/30/2019 Echocardiogram    1. Left ventricular ejection fraction, by visual estimation, is 60 to 65%. The left ventricle has normal function. There is no left ventricular hypertrophy.  2. Left ventricular diastolic parameters are consistent with Grade I diastolic dysfunction (impaired relaxation).  3. The left ventricle has no regional wall motion abnormalities.  4. Global right ventricle has normal systolic function.The right ventricular size is normal. No increase in right ventricular wall thickness.  5. Left atrial size was mild-moderately  dilated.  6. Right atrial size was normal.  7. The mitral valve is normal in structure. Trivial mitral valve regurgitation.  8. The tricuspid valve is normal in structure. Tricuspid valve regurgitation is trivial.  9. The aortic valve is normal in structure. Aortic valve regurgitation is trivial. No evidence of aortic valve sclerosis or stenosis. 10. The pulmonic valve was grossly normal. Pulmonic valve regurgitation is not visualized. 11. Mildly elevated pulmonary artery systolic pressure. 12. The inferior vena cava is normal in size with greater than 50% respiratory variability, suggesting right atrial pressure of 3 mmHg. 13. The average left ventricular global longitudinal strain is -12.5 %. GLS underestimated due to poor endocardial tracking.     09/27/2019 Imaging   1. Multiple small, nonspecific pulmonary nodules are again noted. The previously noted lung nodules are unchanged in size from previous exam. There is a new lung nodule within the medial right lower lobe which is nonspecific measure 4 mm. Attention at follow-up imaging is recommended. 2. No evidence of metastatic disease within the abdomen or pelvis. 3. Coronary artery calcifications.  The 4.  Aortic Atherosclerosis (ICD10-I70.0).   09/30/2019 Echocardiogram    1. Left ventricular ejection fraction, by estimation, is 60 to 65%. The left ventricle has normal function. The left ventricle has no regional wall motion abnormalities. Left ventricular diastolic parameters are consistent with Grade  I diastolic dysfunction (impaired relaxation).  2. Right ventricular systolic function is normal. The right ventricular size is normal. There is normal pulmonary artery systolic pressure. The estimated right ventricular systolic pressure is [id number] mmHg.  3. The mitral valve is normal in structure. No evidence of mitral valve regurgitation. No evidence of mitral stenosis.  4. The aortic valve is normal in structure. Aortic valve regurgitation is  trivial. No aortic stenosis is present.  5. The inferior vena cava is normal in size with greater than 50% respiratory variability, suggesting right atrial pressure of 3 mmHg.     01/07/2020 Echocardiogram    1. Normal LV function; grade 1 diastolic dysfunction; mild AI; GLS -21%.  2. Left ventricular ejection fraction, by estimation, is 55 to 60%. The left ventricle has normal function. The left ventricle has no regional wall motion abnormalities. Left ventricular diastolic parameters are consistent with Grade I diastolic dysfunction (impaired relaxation).  3. Right ventricular systolic function is normal. The right ventricular size is normal.  4. The mitral valve is normal in structure. Trivial mitral valve regurgitation. No evidence of mitral stenosis.  5. The aortic valve is tricuspid. Aortic valve regurgitation is mild. Mild aortic valve sclerosis is present, with no evidence of aortic valve stenosis.  6. The inferior vena cava is normal in size with greater than 50% respiratory variability, suggesting right atrial pressure of 3 mmHg.     03/27/2020 Imaging   1. Status post hysterectomy. No evidence of recurrent or metastatic disease in the abdomen or pelvis. 2. Multiple small pulmonary nodules in the lung bases are unchanged to the extent that they are included on current examination and remain most likely sequelae of prior infection or inflammation. Attention on follow-up. 3. Aortic Atherosclerosis (ICD10-I70.0).   04/17/2020 Echocardiogram   1. Normal LVEF, normal and unchanged GLS: -20.2%.  2. Left ventricular ejection fraction, by estimation, is 60 to 65%. The left ventricle has normal function. The left ventricle has no regional wall motion abnormalities. There is mild concentric left ventricular hypertrophy. Left ventricular diastolic parameters are consistent with Grade I diastolic dysfunction (impaired relaxation). The average left ventricular global longitudinal strain is -20.2 %. The  global longitudinal strain is normal.  3. Right ventricular systolic function is normal. The right ventricular size is normal.  4. The mitral valve is normal in structure. Mild mitral valve regurgitation. No evidence of mitral stenosis.  5. The aortic valve is normal in structure. There is mild calcification of the aortic valve. There is mild thickening of the aortic valve. Aortic valve regurgitation is mild. No aortic stenosis is present.  6. The inferior vena cava is normal in size with greater than 50% respiratory variability, suggesting right atrial pressure of 3 mmHg.   08/02/2020 Echocardiogram   1. Left ventricular ejection fraction, by estimation, is 55 to 60%. The left ventricle has normal function. The left ventricle has no regional wall motion abnormalities. Left ventricular diastolic parameters are consistent with Grade I diastolic dysfunction (impaired relaxation). The average left ventricular global longitudinal strain is -27.0 %. The global longitudinal strain is normal.  2. Right ventricular systolic function is normal. The right ventricular size is normal. Tricuspid regurgitation signal is inadequate for assessing PA pressure.  3. The mitral valve is grossly normal. Trivial mitral valve regurgitation. No evidence of mitral stenosis.  4. The aortic valve is tricuspid. Aortic valve regurgitation is mild. No aortic stenosis is present.  5. The inferior vena cava is normal in size with greater than  50% respiratory variability, suggesting right atrial pressure of 3 mmHg.   09/19/2020 Imaging   1. Status post hysterectomy. No evidence of recurrent or new metastatic disease within the chest, abdomen, or pelvis. 2. Scattered bilateral tiny pulmonary nodules, unchanged from prior studies and most likely sequela of prior infection or inflammation, recommend attention on follow-up imaging. No new suspicious pulmonary nodules or masses. 3. Subcutaneous edema and small hematoma overlying the  posterior gluteal musculature, recommend correlation with recent history of trauma. 4. Mild low-density wall thickening of the gastric antrum, which may represent gastritis. 5. Hepatic steatosis. 6. Extensive sigmoid colonic diverticulosis without findings of acute diverticulitis. 7. Aortic atherosclerosis.  Aortic Atherosclerosis (ICD10-I70.0).   11/20/2020 Echocardiogram    1. Compared to echo from Jan 2022, Global longitudinal strain is less negative. (Previously -27%). Left ventricular ejection fraction, by estimation, is 60 to 65%. The left ventricle has normal function. The left ventricle has no regional wall motion abnormalities. There is mild left ventricular hypertrophy. Left ventricular diastolic parameters are indeterminate.  2. Right ventricular systolic function is normal. The right ventricular size is normal.  3. The mitral valve is normal in structure. Trivial mitral valve regurgitation.  4. Aortic valve regurgitation is mild. Mild aortic valve sclerosis is present, with no evidence of aortic valve stenosis.     03/12/2021 Echocardiogram   1. Left ventricular ejection fraction, by estimation, is 60 to 65%. Left ventricular ejection fraction by 2D MOD biplane is 63.6 %. The left ventricle has normal function. The left ventricle has no regional wall motion abnormalities. There is mild left ventricular hypertrophy. Left ventricular diastolic parameters are consistent with Grade I diastolic dysfunction (impaired relaxation). The average left ventricular global longitudinal strain is -20.0 %. The global longitudinal strain is normal.  2. Right ventricular systolic function is normal. The right ventricular size is normal. There is normal pulmonary artery systolic pressure. The estimated right ventricular systolic pressure is 0000000 mmHg.  3. The mitral valve is normal in structure. No evidence of mitral valve regurgitation. No evidence of mitral stenosis.  4. The aortic valve is tricuspid.  Aortic valve regurgitation is trivial. No aortic stenosis is present. Aortic regurgitation PHT measures 403 msec.  5. The inferior vena cava is normal in size with greater than 50% respiratory variability, suggesting right atrial pressure of 3 mmHg.   04/02/2021 Imaging   Stable small pulmonary nodules scattered throughout the chest, unchanged, without new or suspicious pulmonary nodule.   Three-vessel coronary artery disease.   Aortic Atherosclerosis (ICD10-I70.0).   06/06/2021 Imaging    1. Left ventricular ejection fraction, by estimation, is 60 to 65%. The left ventricle has normal function. The left ventricle has no regional wall motion abnormalities. Left ventricular diastolic parameters are consistent with Grade I diastolic dysfunction (impaired relaxation).  2. Right ventricular systolic function is normal. The right ventricular size is normal. There is normal pulmonary artery systolic pressure. The estimated right ventricular systolic pressure is [id number] mmHg.  3. Left atrial size was mildly dilated.  4. The mitral valve is normal in structure. Trivial mitral valve regurgitation.  5. The aortic valve is tricuspid. Aortic valve regurgitation is mild. No aortic stenosis is present.  6. The inferior vena cava is normal in size with greater than 50% respiratory variability, suggesting right atrial pressure of 3 mmHg.  7. GLS attempted but not reported due to suboptimal tracking.     09/25/2021 Echocardiogram    1. Left ventricular ejection fraction, by estimation, is 60 to 65%.  Left ventricular ejection fraction by 3D volume is 56 %. The left ventricle has normal function. The left ventricle has no regional wall motion abnormalities. Left ventricular diastolic parameters were normal.  2. Right ventricular systolic function is normal. The right ventricular size is normal. There is normal pulmonary artery systolic pressure.  3. The mitral valve is normal in structure. Trivial mitral valve  regurgitation. No evidence of mitral stenosis.  4. The aortic valve is tricuspid. Aortic valve regurgitation is mild. No aortic stenosis is present.  5. The inferior vena cava is normal in size with greater than 50% respiratory variability, suggesting right atrial pressure of 3 mmHg.   10/15/2021 Imaging   1. Stable examination post prior hysterectomy without evidence of local recurrence or metastatic disease within the chest, abdomen, or pelvis. 2. Unchanged small bilateral pulmonary nodules, likely benign. 3. Left-sided colonic diverticulosis without findings of acute diverticulitis. 4. Aortic Atherosclerosis (ICD10-I70.0).       12/31/2021 Echocardiogram    1. Left ventricular ejection fraction, by estimation, is 65 to 70%. The left ventricle has normal function. The left ventricle has no regional wall motion abnormalities. Left ventricular diastolic parameters are consistent with Grade I diastolic dysfunction (impaired relaxation).  2. Right ventricular systolic function is normal. The right ventricular size is normal. There is normal pulmonary artery systolic pressure. The estimated right ventricular systolic pressure is [id number] mmHg.  3. The mitral valve is grossly normal. Trivial mitral valve regurgitation. No evidence of mitral stenosis.  4. The aortic valve is tricuspid. Aortic valve regurgitation is mild. No aortic stenosis is present.  5. The inferior vena cava is normal in size with greater than 50% respiratory variability, suggesting right atrial pressure of 3 mmHg.     04/02/2022 -  Chemotherapy   Patient is on Treatment Plan : BREAST MAINTENANCE Trastuzumab IV (6) or SQ (600) D1 q21d X 11 Cycles     04/04/2022 Echocardiogram    1. Left ventricular ejection fraction, by estimation, is 65 to 70%. The left ventricle has normal function. The left ventricle has no regional wall motion abnormalities. Left ventricular diastolic parameters are consistent with Grade I diastolic dysfunction  (impaired relaxation).  2. Right ventricular systolic function is normal. The right ventricular size is normal.  3. Left atrial size was mildly dilated.  4. The mitral valve is normal in structure. Trivial mitral valve regurgitation. No evidence of mitral stenosis.  5. The aortic valve is tricuspid. There is mild calcification of the aortic valve. Aortic valve regurgitation is mild. No aortic stenosis is present. Aortic regurgitation PHT measures 613 msec. Aortic valve area, by VTI measures 1.70 cm. Aortic valve mean gradient measures 3.0 mmHg. Aortic valve Vmax measures 1.15 m/s.  6. The inferior vena cava is normal in size with greater than 50% respiratory variability, suggesting right atrial pressure of 3 mmHg.   07/02/2022 Echocardiogram   1. Left ventricular ejection fraction, by estimation, is 60 to 65%. Left ventricular ejection fraction by 3D volume is 58 %. The left ventricle has normal function. The left ventricle has no regional wall motion abnormalities. Left ventricular diastolic parameters are consistent with Grade I diastolic dysfunction (impaired relaxation). The average left ventricular global longitudinal strain is -20.7 %. The global longitudinal strain is normal.  2. Right ventricular systolic function is normal. The right ventricular size is normal. Tricuspid regurgitation signal is inadequate for assessing PA pressure.  3. Left atrial size was mildly dilated.  4. The mitral valve is normal in structure. Trivial  mitral valve regurgitation. No evidence of mitral stenosis.  5. The aortic valve is grossly normal. Aortic valve regurgitation is mild. No aortic stenosis is present.  6. The inferior vena cava is normal in size with greater than 50% respiratory variability, suggesting right atrial pressure of 3 mmHg.   Metastasis to lymph nodes ([place])  10/14/2018 Initial Diagnosis   Metastasis to lymph nodes ([place])   10/21/2018 - 02/24/2019 Chemotherapy   The patient had palonosetron  (ALOXI) injection 0.25 mg, 0.25 mg, Intravenous,  Once, 7 of 7 cycles Administration: 0.25 mg (10/21/2018), 0.25 mg (11/11/2018), 0.25 mg (12/02/2018), 0.25 mg (12/23/2018), 0.25 mg (01/13/2019), 0.25 mg (02/03/2019), 0.25 mg (02/24/2019) CARBOplatin (PARAPLATIN) 310 mg in sodium chloride 0.9 % 250 mL chemo infusion, 310 mg, Intravenous,  Once, 7 of 7 cycles Dose modification: 300 mg (original dose 311.5 mg, Cycle 4, Reason: Dose not tolerated) Administration: 310 mg (10/21/2018), 300 mg (11/11/2018), 300 mg (12/02/2018), 300 mg (12/23/2018), 300 mg (01/13/2019), 300 mg (02/03/2019), 300 mg (02/24/2019) PACLitaxel (TAXOL) 222 mg in sodium chloride 0.9 % 250 mL chemo infusion (> 49m/m2), 135 mg/m2 = 222 mg, Intravenous,  Once, 7 of 7 cycles Administration: 222 mg (10/21/2018), 222 mg (11/11/2018), 222 mg (12/02/2018), 222 mg (12/23/2018), 222 mg (01/13/2019), 222 mg (02/03/2019), 222 mg (02/24/2019) fosaprepitant (EMEND) 150 mg, dexamethasone (DECADRON) 12 mg in sodium chloride 0.9 % 145 mL IVPB, , Intravenous,  Once, 7 of 7 cycles Administration:  (10/21/2018),  (11/11/2018),  (12/02/2018),  (12/23/2018),  (01/13/2019),  (02/03/2019),  (02/24/2019)  for chemotherapy treatment.    04/02/2022 -  Chemotherapy   Patient is on Treatment Plan : BREAST MAINTENANCE Trastuzumab IV (6) or SQ (600) D1 q21d X 11 Cycles       PHYSICAL EXAMINATION: ECOG PERFORMANCE STATUS: 0 - Asymptomatic  Vitals:   09/03/22 0837  BP: (!) 148/71  Pulse: 72  Resp: 18  Temp: 97.9 F (36.6 C)  SpO2: 97%   Filed Weights   09/03/22 0837  Weight: 136 lb 9.6 oz (62 kg)    GENERAL:alert, no distress and comfortable  NEURO: alert & oriented x 3 with fluent speech, no focal motor/sensory deficits  LABORATORY DATA:  I have reviewed the data as listed    Component Value Date/Time   NA 136 09/03/2022 0812   NA 139 02/13/2017 1512   K 4.0 09/03/2022 0812   K 4.2 02/13/2017 1512   CL 102 09/03/2022 0812   CO2 26 09/03/2022 0812   CO2 28 02/13/2017  1512   GLUCOSE 94 09/03/2022 0812   GLUCOSE 118 02/13/2017 1512   BUN 31 (H) 09/03/2022 0812   BUN 25.2 02/13/2017 1512   CREATININE 1.30 (H) 09/03/2022 0812   CREATININE 1.2 (H) 02/13/2017 1512   CALCIUM 8.9 09/03/2022 0812   CALCIUM 10.0 02/13/2017 1512   PROT 6.8 03/12/2022 1000   ALBUMIN 4.4 03/12/2022 1000   AST 23 03/12/2022 1000   AST 19 12/21/2019 0850   ALT 13 03/12/2022 1000   ALT 13 12/21/2019 0850   ALKPHOS 51 03/12/2022 1000   BILITOT 0.4 03/12/2022 1000   BILITOT 0.4 12/21/2019 0850   GFRNONAA 41 (L) 09/03/2022 0812   GFRAA 47 (L) 03/28/2020 0933   GFRAA 42 (L) 12/21/2019 0850    No results found for: "SPEP", "UPEP"  Lab Results  Component Value Date   WBC 6.1 09/03/2022   NEUTROABS 3.5 09/03/2022   HGB 10.7 (L) 09/03/2022   HCT 30.3 (L) 09/03/2022   MCV 91.3 09/03/2022  PLT 230 09/03/2022      Chemistry      Component Value Date/Time   NA 136 09/03/2022 0812   NA 139 02/13/2017 1512   K 4.0 09/03/2022 0812   K 4.2 02/13/2017 1512   CL 102 09/03/2022 0812   CO2 26 09/03/2022 0812   CO2 28 02/13/2017 1512   BUN 31 (H) 09/03/2022 0812   BUN 25.2 02/13/2017 1512   CREATININE 1.30 (H) 09/03/2022 0812   CREATININE 1.2 (H) 02/13/2017 1512      Component Value Date/Time   CALCIUM 8.9 09/03/2022 0812   CALCIUM 10.0 02/13/2017 1512   ALKPHOS 51 03/12/2022 1000   AST 23 03/12/2022 1000   AST 19 12/21/2019 0850   ALT 13 03/12/2022 1000   ALT 13 12/21/2019 0850   BILITOT 0.4 03/12/2022 1000   BILITOT 0.4 12/21/2019 0850

## 2022-09-03 NOTE — Assessment & Plan Note (Signed)
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

## 2022-09-03 NOTE — Assessment & Plan Note (Signed)
Renal function is stable. Monitor closely

## 2022-09-03 NOTE — Patient Instructions (Signed)
[facility]  Discharge Instructions: Thank you for choosing [facility] to provide your oncology and hematology care.   If you have a lab appointment with the [facility], please go directly to the [facility] and check in at the registration area.   Wear comfortable clothing and clothing appropriate for easy access to any Portacath or PICC line.   We strive to give you quality time with your provider. You may need to reschedule your appointment if you arrive late (15 or more minutes).  Arriving late affects you and other patients whose appointments are after yours.  Also, if you miss three or more appointments without notifying the office, you may be dismissed from the clinic at the provider's discretion.      For prescription refill requests, have your pharmacy contact our office and allow 72 hours for refills to be completed.    Today you received the following chemotherapy and/or immunotherapy agents: Kanjinti      To help prevent nausea and vomiting after your treatment, we encourage you to take your nausea medication as directed.  BELOW ARE SYMPTOMS THAT SHOULD BE REPORTED IMMEDIATELY: *FEVER GREATER THAN 100.4 F (38 C) OR HIGHER *CHILLS OR SWEATING *NAUSEA AND VOMITING THAT IS NOT CONTROLLED WITH YOUR NAUSEA MEDICATION *UNUSUAL SHORTNESS OF BREATH *UNUSUAL BRUISING OR BLEEDING *URINARY PROBLEMS (pain or burning when urinating, or frequent urination) *BOWEL PROBLEMS (unusual diarrhea, constipation, pain near the anus) TENDERNESS IN MOUTH AND THROAT WITH OR WITHOUT PRESENCE OF ULCERS (sore throat, sores in mouth, or a toothache) UNUSUAL RASH, SWELLING OR PAIN  UNUSUAL VAGINAL DISCHARGE OR ITCHING   Items with * indicate a potential emergency and should be followed up as soon as possible or go to the Emergency Department if any problems should occur.  Please show the CHEMOTHERAPY ALERT CARD or IMMUNOTHERAPY ALERT CARD at  check-in to the Emergency Department and triage nurse.  Should you have questions after your visit or need to cancel or reschedule your appointment, please contact [facility]  Dept: [phone]  and follow the prompts.  Office hours are 8:00 a.m. to 4:30 p.m. Monday - Friday. Please note that voicemails left after 4:00 p.m. may not be returned until the following business day.  We are closed weekends and major holidays. You have access to a nurse at all times for urgent questions. Please call the main number to the clinic Dept: [phone] and follow the prompts.   For any non-urgent questions, you may also contact your provider using MyChart. We now offer e-Visits for anyone 32 and older to request care online for non-urgent symptoms. For details visit [email or web address].   Also download the MyChart app! Go to the app store, search "MyChart", open the app, select [facility], and log in with your MyChart username and password.

## 2022-09-04 ENCOUNTER — Other Ambulatory Visit: Payer: Self-pay

## 2022-09-10 DIAGNOSIS — M25511 Pain in right shoulder: Secondary | ICD-10-CM | POA: Diagnosis not present

## 2022-09-24 ENCOUNTER — Inpatient Hospital Stay: Payer: Medicare HMO | Attending: Gynecologic Oncology

## 2022-09-24 ENCOUNTER — Other Ambulatory Visit: Payer: Self-pay

## 2022-09-24 VITALS — BP 165/77 | HR 73 | Temp 98.1°F | Resp 18 | Wt 133.8 lb

## 2022-09-24 DIAGNOSIS — C541 Malignant neoplasm of endometrium: Secondary | ICD-10-CM | POA: Insufficient documentation

## 2022-09-24 DIAGNOSIS — Z90721 Acquired absence of ovaries, unilateral: Secondary | ICD-10-CM | POA: Insufficient documentation

## 2022-09-24 DIAGNOSIS — C773 Secondary and unspecified malignant neoplasm of axilla and upper limb lymph nodes: Secondary | ICD-10-CM | POA: Insufficient documentation

## 2022-09-24 DIAGNOSIS — Z90710 Acquired absence of both cervix and uterus: Secondary | ICD-10-CM | POA: Diagnosis not present

## 2022-09-24 DIAGNOSIS — Z5112 Encounter for antineoplastic immunotherapy: Secondary | ICD-10-CM | POA: Insufficient documentation

## 2022-09-24 DIAGNOSIS — Z9079 Acquired absence of other genital organ(s): Secondary | ICD-10-CM | POA: Insufficient documentation

## 2022-09-24 DIAGNOSIS — Z7189 Other specified counseling: Secondary | ICD-10-CM

## 2022-09-24 MED ORDER — SODIUM CHLORIDE 0.9 % IV SOLN: Freq: Once | INTRAVENOUS | Status: AC

## 2022-09-24 MED ORDER — ACETAMINOPHEN 325 MG PO TABS
650.0000 mg | ORAL_TABLET | Freq: Once | ORAL | Status: AC
  Administered 2022-09-24: 650 mg via ORAL
  Filled 2022-09-24: qty 2

## 2022-09-24 MED ORDER — HEPARIN SOD (PORK) LOCK FLUSH 100 UNIT/ML IV SOLN
500.0000 [IU] | Freq: Once | INTRAVENOUS | Status: AC | PRN
  Administered 2022-09-24: 500 [IU]

## 2022-09-24 MED ORDER — DIPHENHYDRAMINE HCL 25 MG PO CAPS
25.0000 mg | ORAL_CAPSULE | Freq: Once | ORAL | Status: AC
  Administered 2022-09-24: 25 mg via ORAL
  Filled 2022-09-24: qty 1

## 2022-09-24 MED ORDER — TRASTUZUMAB-ANNS CHEMO 150 MG IV SOLR
6.0000 mg/kg | Freq: Once | INTRAVENOUS | Status: AC
  Administered 2022-09-24: 357 mg via INTRAVENOUS
  Filled 2022-09-24: qty 17

## 2022-09-24 MED ORDER — SODIUM CHLORIDE 0.9% FLUSH
10.0000 mL | INTRAVENOUS | Status: DC | PRN
  Administered 2022-09-24: 10 mL

## 2022-09-24 NOTE — Patient Instructions (Signed)
[facility]  Discharge Instructions: Thank you for choosing [facility] to provide your oncology and hematology care.   If you have a lab appointment with the Cancer Center, please go directly to the Cancer Center and check in at the registration area.   Wear comfortable clothing and clothing appropriate for easy access to any Portacath or PICC line.   We strive to give you quality time with your provider. You may need to reschedule your appointment if you arrive late (15 or more minutes).  Arriving late affects you and other patients whose appointments are after yours.  Also, if you miss three or more appointments without notifying the office, you may be dismissed from the clinic at the provider's discretion.      For prescription refill requests, have your pharmacy contact our office and allow 72 hours for refills to be completed.    Today you received the following chemotherapy and/or immunotherapy agents: Trastuzumab      To help prevent nausea and vomiting after your treatment, we encourage you to take your nausea medication as directed.  BELOW ARE SYMPTOMS THAT SHOULD BE REPORTED IMMEDIATELY: *FEVER GREATER THAN 100.4 F (38 C) OR HIGHER *CHILLS OR SWEATING *NAUSEA AND VOMITING THAT IS NOT CONTROLLED WITH YOUR NAUSEA MEDICATION *UNUSUAL SHORTNESS OF BREATH *UNUSUAL BRUISING OR BLEEDING *URINARY PROBLEMS (pain or burning when urinating, or frequent urination) *BOWEL PROBLEMS (unusual diarrhea, constipation, pain near the anus) TENDERNESS IN MOUTH AND THROAT WITH OR WITHOUT PRESENCE OF ULCERS (sore throat, sores in mouth, or a toothache) UNUSUAL RASH, SWELLING OR PAIN  UNUSUAL VAGINAL DISCHARGE OR ITCHING   Items with * indicate a potential emergency and should be followed up as soon as possible or go to the Emergency Department if any problems should occur.  Please show the CHEMOTHERAPY ALERT CARD or IMMUNOTHERAPY ALERT CARD at  check-in to the Emergency Department and triage nurse.  Should you have questions after your visit or need to cancel or reschedule your appointment, please contact [facility]  Dept: [phone]  and follow the prompts.  Office hours are 8:00 a.m. to 4:30 p.m. Monday - Friday. Please note that voicemails left after 4:00 p.m. may not be returned until the following business day.  We are closed weekends and major holidays. You have access to a nurse at all times for urgent questions. Please call the main number to the clinic Dept: [phone] and follow the prompts.   For any non-urgent questions, you may also contact your provider using MyChart. We now offer e-Visits for anyone 18 and older to request care online for non-urgent symptoms. For details visit [email or web address].   Also download the MyChart app! Go to the app store, search "MyChart", open the app, select [facility], and log in with your MyChart username and password.  

## 2022-09-27 DIAGNOSIS — R739 Hyperglycemia, unspecified: Secondary | ICD-10-CM | POA: Diagnosis not present

## 2022-09-27 DIAGNOSIS — E785 Hyperlipidemia, unspecified: Secondary | ICD-10-CM | POA: Diagnosis not present

## 2022-09-27 DIAGNOSIS — E039 Hypothyroidism, unspecified: Secondary | ICD-10-CM | POA: Diagnosis not present

## 2022-10-07 DIAGNOSIS — K219 Gastro-esophageal reflux disease without esophagitis: Secondary | ICD-10-CM | POA: Diagnosis not present

## 2022-10-07 DIAGNOSIS — R739 Hyperglycemia, unspecified: Secondary | ICD-10-CM | POA: Diagnosis not present

## 2022-10-07 DIAGNOSIS — M8580 Other specified disorders of bone density and structure, unspecified site: Secondary | ICD-10-CM | POA: Diagnosis not present

## 2022-10-07 DIAGNOSIS — R270 Ataxia, unspecified: Secondary | ICD-10-CM | POA: Diagnosis not present

## 2022-10-07 DIAGNOSIS — E785 Hyperlipidemia, unspecified: Secondary | ICD-10-CM | POA: Diagnosis not present

## 2022-10-07 DIAGNOSIS — N1832 Chronic kidney disease, stage 3b: Secondary | ICD-10-CM | POA: Diagnosis not present

## 2022-10-07 DIAGNOSIS — I10 Essential (primary) hypertension: Secondary | ICD-10-CM | POA: Diagnosis not present

## 2022-10-07 DIAGNOSIS — E039 Hypothyroidism, unspecified: Secondary | ICD-10-CM | POA: Diagnosis not present

## 2022-10-15 ENCOUNTER — Inpatient Hospital Stay: Payer: Medicare HMO

## 2022-10-15 ENCOUNTER — Telehealth: Payer: Self-pay

## 2022-10-15 ENCOUNTER — Ambulatory Visit (HOSPITAL_COMMUNITY): Payer: Medicare HMO

## 2022-10-15 ENCOUNTER — Telehealth: Payer: Self-pay | Admitting: Hematology and Oncology

## 2022-10-15 ENCOUNTER — Inpatient Hospital Stay (HOSPITAL_COMMUNITY): Admission: RE | Admit: 2022-10-15 | Payer: Medicare HMO | Source: Ambulatory Visit

## 2022-10-15 ENCOUNTER — Other Ambulatory Visit: Payer: Self-pay | Admitting: Hematology and Oncology

## 2022-10-15 DIAGNOSIS — Z7189 Other specified counseling: Secondary | ICD-10-CM

## 2022-10-15 DIAGNOSIS — C541 Malignant neoplasm of endometrium: Secondary | ICD-10-CM

## 2022-10-15 DIAGNOSIS — C773 Secondary and unspecified malignant neoplasm of axilla and upper limb lymph nodes: Secondary | ICD-10-CM

## 2022-10-15 NOTE — Telephone Encounter (Signed)
She called back. She has echo and CT scheduled now on 4/23. She is agreeable to Dr. Alvy Bimler recommendations. Appt on 4/4 with Dr. Alvy Bimler canceled. She would like appts on 4/25 per Dr. Alvy Bimler recommendations.

## 2022-10-15 NOTE — Telephone Encounter (Signed)
I sent LOS to schedule labs.,flush before CT and see me with treatment on 4/25

## 2022-10-15 NOTE — Telephone Encounter (Signed)
I do not see CT rescheduled yet I prefer to see her after her CT If she gets ECHO and CT done on 4/23, I can see her on 4/25. She can still keep her treatment on 4/4 Let me know her decision

## 2022-10-15 NOTE — Telephone Encounter (Signed)
Called and left a message asking her to call the office back. 

## 2022-10-15 NOTE — Telephone Encounter (Signed)
She called and left a message to cancel port lab flush today. She is calling to rescheduled CT scan that is scheduled for today. She rescheduled echo that was scheduled for today to 4/23. Her flight from [place] was canceled yesterday and she will flight home tomorrow. She asking if she should keep appts as scheduled on 4/4

## 2022-10-15 NOTE — Telephone Encounter (Signed)
Spoke with patient confirming upcoming appointments  

## 2022-10-17 ENCOUNTER — Other Ambulatory Visit: Payer: Self-pay

## 2022-10-17 ENCOUNTER — Inpatient Hospital Stay: Payer: Medicare HMO | Attending: Gynecologic Oncology

## 2022-10-17 ENCOUNTER — Inpatient Hospital Stay: Payer: Medicare HMO | Admitting: Hematology and Oncology

## 2022-10-17 VITALS — BP 164/77 | HR 80 | Temp 97.7°F | Resp 18 | Wt 134.8 lb

## 2022-10-17 DIAGNOSIS — C541 Malignant neoplasm of endometrium: Secondary | ICD-10-CM | POA: Insufficient documentation

## 2022-10-17 DIAGNOSIS — I1310 Hypertensive heart and chronic kidney disease without heart failure, with stage 1 through stage 4 chronic kidney disease, or unspecified chronic kidney disease: Secondary | ICD-10-CM | POA: Insufficient documentation

## 2022-10-17 DIAGNOSIS — Z5112 Encounter for antineoplastic immunotherapy: Secondary | ICD-10-CM | POA: Insufficient documentation

## 2022-10-17 DIAGNOSIS — C773 Secondary and unspecified malignant neoplasm of axilla and upper limb lymph nodes: Secondary | ICD-10-CM | POA: Insufficient documentation

## 2022-10-17 DIAGNOSIS — Z7982 Long term (current) use of aspirin: Secondary | ICD-10-CM | POA: Diagnosis not present

## 2022-10-17 DIAGNOSIS — Z79899 Other long term (current) drug therapy: Secondary | ICD-10-CM | POA: Insufficient documentation

## 2022-10-17 DIAGNOSIS — N1832 Chronic kidney disease, stage 3b: Secondary | ICD-10-CM | POA: Insufficient documentation

## 2022-10-17 DIAGNOSIS — Z90710 Acquired absence of both cervix and uterus: Secondary | ICD-10-CM | POA: Diagnosis not present

## 2022-10-17 DIAGNOSIS — Z631 Problems in relationship with in-laws: Secondary | ICD-10-CM | POA: Insufficient documentation

## 2022-10-17 DIAGNOSIS — Z7189 Other specified counseling: Secondary | ICD-10-CM

## 2022-10-17 MED ORDER — SODIUM CHLORIDE 0.9% FLUSH
10.0000 mL | INTRAVENOUS | Status: DC | PRN
  Administered 2022-10-17: 10 mL

## 2022-10-17 MED ORDER — SODIUM CHLORIDE 0.9 % IV SOLN: Freq: Once | INTRAVENOUS | Status: AC

## 2022-10-17 MED ORDER — ACETAMINOPHEN 325 MG PO TABS
650.0000 mg | ORAL_TABLET | Freq: Once | ORAL | Status: AC
  Administered 2022-10-17: 650 mg via ORAL
  Filled 2022-10-17: qty 2

## 2022-10-17 MED ORDER — HEPARIN SOD (PORK) LOCK FLUSH 100 UNIT/ML IV SOLN
500.0000 [IU] | Freq: Once | INTRAVENOUS | Status: AC | PRN
  Administered 2022-10-17: 500 [IU]

## 2022-10-17 MED ORDER — TRASTUZUMAB-ANNS CHEMO 150 MG IV SOLR
6.0000 mg/kg | Freq: Once | INTRAVENOUS | Status: AC
  Administered 2022-10-17: 357 mg via INTRAVENOUS
  Filled 2022-10-17: qty 17

## 2022-10-17 MED ORDER — DIPHENHYDRAMINE HCL 25 MG PO CAPS
25.0000 mg | ORAL_CAPSULE | Freq: Once | ORAL | Status: AC
  Administered 2022-10-17: 25 mg via ORAL
  Filled 2022-10-17: qty 1

## 2022-10-20 ENCOUNTER — Other Ambulatory Visit: Payer: Self-pay

## 2022-10-23 DIAGNOSIS — N1832 Chronic kidney disease, stage 3b: Secondary | ICD-10-CM | POA: Diagnosis not present

## 2022-10-23 DIAGNOSIS — R739 Hyperglycemia, unspecified: Secondary | ICD-10-CM | POA: Diagnosis not present

## 2022-10-23 DIAGNOSIS — E039 Hypothyroidism, unspecified: Secondary | ICD-10-CM | POA: Diagnosis not present

## 2022-10-23 DIAGNOSIS — B009 Herpesviral infection, unspecified: Secondary | ICD-10-CM | POA: Diagnosis not present

## 2022-10-23 DIAGNOSIS — I10 Essential (primary) hypertension: Secondary | ICD-10-CM | POA: Diagnosis not present

## 2022-10-23 DIAGNOSIS — J309 Allergic rhinitis, unspecified: Secondary | ICD-10-CM | POA: Diagnosis not present

## 2022-10-23 DIAGNOSIS — M8580 Other specified disorders of bone density and structure, unspecified site: Secondary | ICD-10-CM | POA: Diagnosis not present

## 2022-10-23 DIAGNOSIS — Z0000 Encounter for general adult medical examination without abnormal findings: Secondary | ICD-10-CM | POA: Diagnosis not present

## 2022-10-23 DIAGNOSIS — Z1331 Encounter for screening for depression: Secondary | ICD-10-CM | POA: Diagnosis not present

## 2022-10-23 DIAGNOSIS — E785 Hyperlipidemia, unspecified: Secondary | ICD-10-CM | POA: Diagnosis not present

## 2022-10-23 DIAGNOSIS — K219 Gastro-esophageal reflux disease without esophagitis: Secondary | ICD-10-CM | POA: Diagnosis not present

## 2022-10-25 DIAGNOSIS — R270 Ataxia, unspecified: Secondary | ICD-10-CM | POA: Diagnosis not present

## 2022-10-25 DIAGNOSIS — H8112 Benign paroxysmal vertigo, left ear: Secondary | ICD-10-CM | POA: Diagnosis not present

## 2022-10-28 DIAGNOSIS — H8112 Benign paroxysmal vertigo, left ear: Secondary | ICD-10-CM | POA: Diagnosis not present

## 2022-10-28 DIAGNOSIS — R270 Ataxia, unspecified: Secondary | ICD-10-CM | POA: Diagnosis not present

## 2022-10-29 DIAGNOSIS — M85852 Other specified disorders of bone density and structure, left thigh: Secondary | ICD-10-CM | POA: Diagnosis not present

## 2022-10-31 DIAGNOSIS — H8112 Benign paroxysmal vertigo, left ear: Secondary | ICD-10-CM | POA: Diagnosis not present

## 2022-10-31 DIAGNOSIS — R270 Ataxia, unspecified: Secondary | ICD-10-CM | POA: Diagnosis not present

## 2022-11-05 ENCOUNTER — Ambulatory Visit (HOSPITAL_BASED_OUTPATIENT_CLINIC_OR_DEPARTMENT_OTHER)
Admission: RE | Admit: 2022-11-05 | Discharge: 2022-11-05 | Disposition: A | Discharge status: Home / Self Care | Payer: Medicare HMO | Source: Ambulatory Visit | Attending: Hematology and Oncology | Admitting: Hematology and Oncology

## 2022-11-05 ENCOUNTER — Inpatient Hospital Stay: Payer: Medicare HMO

## 2022-11-05 ENCOUNTER — Ambulatory Visit (HOSPITAL_COMMUNITY)
Admission: RE | Admit: 2022-11-05 | Discharge: 2022-11-05 | Disposition: A | Discharge status: Home / Self Care | Payer: Medicare HMO | Source: Ambulatory Visit | Attending: Hematology and Oncology | Admitting: Hematology and Oncology

## 2022-11-05 ENCOUNTER — Other Ambulatory Visit: Payer: Self-pay

## 2022-11-05 DIAGNOSIS — K5730 Diverticulosis of large intestine without perforation or abscess without bleeding: Secondary | ICD-10-CM | POA: Diagnosis not present

## 2022-11-05 DIAGNOSIS — I10 Essential (primary) hypertension: Secondary | ICD-10-CM | POA: Insufficient documentation

## 2022-11-05 DIAGNOSIS — I088 Other rheumatic multiple valve diseases: Secondary | ICD-10-CM | POA: Diagnosis not present

## 2022-11-05 DIAGNOSIS — Z5112 Encounter for antineoplastic immunotherapy: Secondary | ICD-10-CM | POA: Diagnosis not present

## 2022-11-05 DIAGNOSIS — Z7189 Other specified counseling: Secondary | ICD-10-CM

## 2022-11-05 DIAGNOSIS — I5030 Unspecified diastolic (congestive) heart failure: Secondary | ICD-10-CM

## 2022-11-05 DIAGNOSIS — C541 Malignant neoplasm of endometrium: Secondary | ICD-10-CM

## 2022-11-05 DIAGNOSIS — Z0189 Encounter for other specified special examinations: Secondary | ICD-10-CM | POA: Diagnosis not present

## 2022-11-05 DIAGNOSIS — N1832 Chronic kidney disease, stage 3b: Secondary | ICD-10-CM | POA: Diagnosis not present

## 2022-11-05 DIAGNOSIS — C773 Secondary and unspecified malignant neoplasm of axilla and upper limb lymph nodes: Secondary | ICD-10-CM

## 2022-11-05 DIAGNOSIS — I351 Nonrheumatic aortic (valve) insufficiency: Secondary | ICD-10-CM | POA: Insufficient documentation

## 2022-11-05 DIAGNOSIS — Z90710 Acquired absence of both cervix and uterus: Secondary | ICD-10-CM | POA: Diagnosis not present

## 2022-11-05 DIAGNOSIS — Z79899 Other long term (current) drug therapy: Secondary | ICD-10-CM | POA: Diagnosis not present

## 2022-11-05 DIAGNOSIS — Z631 Problems in relationship with in-laws: Secondary | ICD-10-CM | POA: Diagnosis not present

## 2022-11-05 DIAGNOSIS — I1310 Hypertensive heart and chronic kidney disease without heart failure, with stage 1 through stage 4 chronic kidney disease, or unspecified chronic kidney disease: Secondary | ICD-10-CM | POA: Diagnosis not present

## 2022-11-05 DIAGNOSIS — Z7982 Long term (current) use of aspirin: Secondary | ICD-10-CM | POA: Diagnosis not present

## 2022-11-05 DIAGNOSIS — C539 Malignant neoplasm of cervix uteri, unspecified: Secondary | ICD-10-CM | POA: Diagnosis not present

## 2022-11-05 DIAGNOSIS — N281 Cyst of kidney, acquired: Secondary | ICD-10-CM | POA: Diagnosis not present

## 2022-11-05 LAB — CBC WITH DIFFERENTIAL (CANCER CENTER ONLY)
Abs Immature Granulocytes: 0.04 10*3/uL (ref 0.00–0.07)
Basophils Absolute: 0.1 10*3/uL (ref 0.0–0.1)
Basophils Relative: 1 %
Eosinophils Absolute: 0.2 10*3/uL (ref 0.0–0.5)
Eosinophils Relative: 3 %
HCT: 31 % — ABNORMAL LOW (ref 36.0–46.0)
Hemoglobin: 11.5 g/dL — ABNORMAL LOW (ref 12.0–15.0)
Immature Granulocytes: 1 %
Lymphocytes Relative: 20 %
Lymphs Abs: 1.3 10*3/uL (ref 0.7–4.0)
MCH: 32.5 pg (ref 26.0–34.0)
MCHC: 37.1 g/dL — ABNORMAL HIGH (ref 30.0–36.0)
MCV: 87.6 fL (ref 80.0–100.0)
Monocytes Absolute: 0.6 10*3/uL (ref 0.1–1.0)
Monocytes Relative: 9 %
Neutro Abs: 4.4 10*3/uL (ref 1.7–7.7)
Neutrophils Relative %: 66 %
Platelet Count: 251 10*3/uL (ref 150–400)
RBC: 3.54 MIL/uL — ABNORMAL LOW (ref 3.87–5.11)
RDW: 11.8 % (ref 11.5–15.5)
WBC Count: 6.5 10*3/uL (ref 4.0–10.5)
nRBC: 0 % (ref 0.0–0.2)

## 2022-11-05 LAB — CMP (CANCER CENTER ONLY)
ALT: 12 U/L (ref 0–44)
AST: 19 U/L (ref 15–41)
Albumin: 4.2 g/dL (ref 3.5–5.0)
Alkaline Phosphatase: 55 U/L (ref 38–126)
Anion gap: 9 (ref 5–15)
BUN: 21 mg/dL (ref 8–23)
CO2: 27 mmol/L (ref 22–32)
Calcium: 9.4 mg/dL (ref 8.9–10.3)
Chloride: 99 mmol/L (ref 98–111)
Creatinine: 1.2 mg/dL — ABNORMAL HIGH (ref 0.44–1.00)
GFR, Estimated: 45 mL/min — ABNORMAL LOW (ref >60)
Glucose, Bld: 93 mg/dL (ref 70–99)
Potassium: 3.6 mmol/L (ref 3.5–5.1)
Sodium: 135 mmol/L (ref 135–145)
Total Bilirubin: 0.4 mg/dL (ref 0.3–1.2)
Total Protein: 6.8 g/dL (ref 6.5–8.1)

## 2022-11-05 LAB — ECHOCARDIOGRAM COMPLETE
Area-P 1/2: 2.84 cm2
Calc EF: 61.2 %
P 1/2 time: 497 msec
S' Lateral: 2.5 cm
Single Plane A2C EF: 58.9 %
Single Plane A4C EF: 64.8 %

## 2022-11-05 MED ORDER — SODIUM CHLORIDE 0.9% FLUSH
10.0000 mL | Freq: Once | INTRAVENOUS | Status: AC
  Administered 2022-11-05: 10 mL

## 2022-11-05 MED ORDER — SODIUM CHLORIDE (PF) 0.9 % IJ SOLN
INTRAMUSCULAR | Status: AC
  Filled 2022-11-05: qty 50

## 2022-11-05 MED ORDER — HEPARIN SOD (PORK) LOCK FLUSH 100 UNIT/ML IV SOLN
INTRAVENOUS | Status: AC
  Filled 2022-11-05: qty 5

## 2022-11-05 MED ORDER — IOHEXOL 300 MG/ML  SOLN
100.0000 mL | Freq: Once | INTRAMUSCULAR | Status: AC | PRN
  Administered 2022-11-05: 100 mL via INTRAVENOUS

## 2022-11-05 NOTE — Progress Notes (Signed)
Echocardiogram 2D Echocardiogram has been performed.  [name] 11/05/2022, 2:03 PM

## 2022-11-07 ENCOUNTER — Inpatient Hospital Stay: Payer: Medicare HMO

## 2022-11-07 ENCOUNTER — Other Ambulatory Visit: Payer: Self-pay

## 2022-11-07 ENCOUNTER — Encounter: Payer: Self-pay | Admitting: Hematology and Oncology

## 2022-11-07 ENCOUNTER — Inpatient Hospital Stay: Payer: Medicare HMO | Admitting: Hematology and Oncology

## 2022-11-07 ENCOUNTER — Telehealth: Payer: Self-pay

## 2022-11-07 VITALS — BP 155/62 | HR 64 | Resp 18

## 2022-11-07 VITALS — BP 167/65 | HR 71 | Temp 97.8°F | Resp 18 | Ht 62.0 in | Wt 136.8 lb

## 2022-11-07 DIAGNOSIS — Z79899 Other long term (current) drug therapy: Secondary | ICD-10-CM | POA: Diagnosis not present

## 2022-11-07 DIAGNOSIS — C541 Malignant neoplasm of endometrium: Secondary | ICD-10-CM

## 2022-11-07 DIAGNOSIS — Z7982 Long term (current) use of aspirin: Secondary | ICD-10-CM | POA: Diagnosis not present

## 2022-11-07 DIAGNOSIS — C773 Secondary and unspecified malignant neoplasm of axilla and upper limb lymph nodes: Secondary | ICD-10-CM

## 2022-11-07 DIAGNOSIS — Z7189 Other specified counseling: Secondary | ICD-10-CM | POA: Diagnosis not present

## 2022-11-07 DIAGNOSIS — I1310 Hypertensive heart and chronic kidney disease without heart failure, with stage 1 through stage 4 chronic kidney disease, or unspecified chronic kidney disease: Secondary | ICD-10-CM | POA: Diagnosis not present

## 2022-11-07 DIAGNOSIS — Z5112 Encounter for antineoplastic immunotherapy: Secondary | ICD-10-CM | POA: Diagnosis not present

## 2022-11-07 DIAGNOSIS — N183 Chronic kidney disease, stage 3 unspecified: Secondary | ICD-10-CM

## 2022-11-07 DIAGNOSIS — Z631 Problems in relationship with in-laws: Secondary | ICD-10-CM | POA: Diagnosis not present

## 2022-11-07 DIAGNOSIS — N1832 Chronic kidney disease, stage 3b: Secondary | ICD-10-CM | POA: Diagnosis not present

## 2022-11-07 DIAGNOSIS — I10 Essential (primary) hypertension: Secondary | ICD-10-CM | POA: Diagnosis not present

## 2022-11-07 DIAGNOSIS — Z90710 Acquired absence of both cervix and uterus: Secondary | ICD-10-CM | POA: Diagnosis not present

## 2022-11-07 DIAGNOSIS — D630 Anemia in neoplastic disease: Secondary | ICD-10-CM

## 2022-11-07 MED ORDER — SODIUM CHLORIDE 0.9% FLUSH
10.0000 mL | INTRAVENOUS | Status: DC | PRN
  Administered 2022-11-07: 10 mL

## 2022-11-07 MED ORDER — DIPHENHYDRAMINE HCL 25 MG PO CAPS
25.0000 mg | ORAL_CAPSULE | Freq: Once | ORAL | Status: AC
  Administered 2022-11-07: 25 mg via ORAL
  Filled 2022-11-07: qty 1

## 2022-11-07 MED ORDER — TRASTUZUMAB-ANNS CHEMO 150 MG IV SOLR
6.0000 mg/kg | Freq: Once | INTRAVENOUS | Status: AC
  Administered 2022-11-07: 357 mg via INTRAVENOUS
  Filled 2022-11-07: qty 17

## 2022-11-07 MED ORDER — HEPARIN SOD (PORK) LOCK FLUSH 100 UNIT/ML IV SOLN
500.0000 [IU] | Freq: Once | INTRAVENOUS | Status: AC | PRN
  Administered 2022-11-07: 500 [IU]

## 2022-11-07 MED ORDER — ACETAMINOPHEN 325 MG PO TABS
650.0000 mg | ORAL_TABLET | Freq: Once | ORAL | Status: AC
  Administered 2022-11-07: 650 mg via ORAL
  Filled 2022-11-07: qty 2

## 2022-11-07 MED ORDER — SODIUM CHLORIDE 0.9 % IV SOLN: Freq: Once | INTRAVENOUS | Status: AC

## 2022-11-07 NOTE — Assessment & Plan Note (Signed)
She tolerated treatment very well and have no major side effects When I saw the patient, CT imaging report was not available I reviewed the imaging studies with the patient and saw no evidence of cancer recurrence Echocardiogram was normal She will continue trastuzumab indefinitely At the time of dictation, CT imaging report becomes available and abnormalities is noted near her stomach I attempted to call the patient to discuss this but not able to get hold of her We will try again later today We will consult gastroenterologist for EGD evaluation to address the anomaly noted near her stomach

## 2022-11-07 NOTE — Assessment & Plan Note (Signed)
Renal function is stable Monitor closely 

## 2022-11-07 NOTE — Telephone Encounter (Signed)
Called per Dr. Bertis Ruddy, she now has the CT results and need for GI referral. She ask that Dr. Bertis Ruddy call her with results. Sen Dr. Bertis Ruddy a message.

## 2022-11-07 NOTE — Progress Notes (Signed)
[facility] OFFICE PROGRESS NOTE  Patient Care Team: Geoffry Paradise, [name]  ASSESSMENT & PLAN:  Endometrial cancer [facility]) She tolerated treatment very well and have no major side effects When I saw the patient, CT imaging report was not available I reviewed the imaging studies with the patient and saw no evidence of cancer recurrence Echocardiogram was normal She will continue trastuzumab indefinitely At the time of dictation, CT imaging report becomes available and abnormalities is noted near her stomach I attempted to call the patient to discuss this but not able to get hold of her We will try again later today We will consult gastroenterologist for EGD evaluation to address the anomaly noted near her stomach  CKD (chronic kidney disease), stage III (HCC) Renal function is stable. Monitor closely  Anemia in neoplastic disease She has multifactorial anemia, anemia secondary to prior chemotherapy as well as chronic kidney disease Anemia is improving/stable Recent vitamin B12 level was adequate She is not symptomatic Observe only  Essential hypertension Her blood pressure is elevated today but generally normal at home Her recent echocardiogram showed diastolic dysfunction She will continue medical management   No orders of the defined types were placed in this encounter.   All questions were answered. The patient knows to call the clinic with any problems, questions or concerns. The total time spent in the appointment was 30 minutes encounter with patients including review of chart and various tests results, discussions about plan of care and coordination of care plan   Artis Delay, MD 11/07/2022 2:18 PM  INTERVAL HISTORY: Please see below for problem oriented charting. she returns for treatment follow-up on maintenance trastuzumab She is doing well No side effects from treatment At the time of evaluation, CT imaging report  was not available   REVIEW OF SYSTEMS:   Constitutional: Denies fevers, chills or abnormal weight loss Eyes: Denies blurriness of vision Ears, nose, mouth, throat, and face: Denies mucositis or sore throat Respiratory: Denies cough, dyspnea or wheezes Cardiovascular: Denies palpitation, chest discomfort or lower extremity swelling Gastrointestinal:  Denies nausea, heartburn or change in bowel habits Skin: Denies abnormal skin rashes Lymphatics: Denies new lymphadenopathy or easy bruising Neurological:Denies numbness, tingling or new weaknesses Behavioral/Psych: Mood is stable, no new changes  All other systems were reviewed with the patient and are negative.  I have reviewed the past medical history, past surgical history, social history and family history with the patient and they are unchanged from previous note.  ALLERGIES:  has No Known Allergies.  MEDICATIONS:  Current Outpatient Medications  Medication Sig Dispense Refill   acetaminophen (TYLENOL) 500 MG tablet Take 1,000 mg by mouth every 6 (six) hours as needed.     aspirin EC 81 MG tablet Take 81 mg by mouth daily.     atorvastatin (LIPITOR) 20 MG tablet Take 20 mg by mouth every evening.      Calcium Carbonate-Vitamin D (CALCIUM-VITAMIN D) 500-200 MG-UNIT per tablet Take 1 tablet by mouth daily.     estradiol (ESTRACE) 0.1 MG/GM vaginal cream PLACE 1 APPLICATORFUL VAGINALLY 3 TIMES A WEEK 42.5 g 12   levothyroxine (SYNTHROID, LEVOTHROID) 100 MCG tablet Take 100 mcg by mouth daily before breakfast.      lidocaine-prilocaine (EMLA) cream Apply 1 Application topically as needed. 30 g 3   losartan-hydrochlorothiazide (HYZAAR) 100-25 MG tablet      metoprolol succinate (TOPROL-XL) 25 MG 24 hr tablet Take 25 mg by mouth daily.  Multiple Vitamin (MULTIVITAMIN WITH MINERALS) TABS Take 1 tablet by mouth daily.     valACYclovir (VALTREX) 1000 MG tablet valacyclovir 1 gram tablet     No current facility-administered medications  for this visit.   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 Her cancer was treated with surgery but no radiation or chemotherapy.   Serous endometrial cancer, MSI stable ER positive, PR neg, Her2/neu 3+      Endometrial cancer  08/29/2016 Pathology Results   Endometrium, biopsy - HIGH GRADE ENDOMETRIAL CARCINOMA, SEE COMMENT. Microscopic Comment The sections show multiple fragments of adenocarcinoma displaying glandular and papillary patterns associated with high grade cytomorphology characterized by nuclear pleomorphism, prominent nucleoli and brisk mitosis. Immunohistochemical stains show that the tumor cells are positive for vimentin, p16, p53 with increased Ki-67 expression. Estrogen and progesterone receptor stains show patchy weak positivity. No significant positivity is seen with CEA. The findings are consistent with high grade endometrial carcinoma and the overall morphology and phenotypic features favor serous carcinoma.   08/29/2016 Initial Diagnosis   She presented with postmenopausal bleeding   10/03/2016 Imaging   CT C/A/P 09/2016 IMPRESSION: 1. Thickening of the endometrial canal up to 19 mm in fundus, presumably corresponding to the patient's reported endometrial carcinoma. 2. Multiple tiny pulmonary nodules scattered throughout the lungs bilaterally measuring 4 mm or less in size. Nodules of this size are typically considered statistically likely benign. In the setting of known primary malignancy, metastatic disease to the lungs is not excluded, but is not strongly favored on  today's examination. Attention on followup studies is recommended to ensure the stability or resolution of these nodules. 3. Subcentimeter low-attenuation lesion in the central aspect of segment 8 of the liver is too small to characterize. This is statistically likely a tiny cyst, but warrants attention on follow-up studies to exclude the possibility of a solitary hepatic metastasis. 4. 1.5 x 1.5 x 1.7 cm well-circumscribed lesion in the proximal stomach. This is of uncertain etiology and significance, and could represent a benign gastric polyp, however, further evaluation with nonemergent endoscopy is suggested in the near future for further evaluation. 5. **An incidental finding of potential clinical significance has been found. 1.1 x 1.6 cm thyroid nodule in the inferior aspect of the right lobe of the thyroid gland. Follow-up evaluation with nonemergent thyroid ultrasound is recommended in the near future to better evaluate this finding. This recommendation follows ACR consensuss guidelines: Managing Incidental Thyroid Nodules Detected on Imaging: White Paper of the ACR Incidental Thyroid Findings Committee. J Am Coll Radiol 2015;12(2):143-150.** 6. Aortic atherosclerosis, in addition to left anterior descending coronary artery disease   10/22/2016 Surgery   Robotic assisted total hysterectomy, BSO and bilateral pelvic lymphadenectomy  Final pathology revealed a 3cm polyp containing serous carcinoma but with no myometrial invasion, no LVSI and negative nodes.  Stage IA Uterine serous cancer   10/22/2016 Pathology Results   1. Lymph nodes, regional resection, right pelvic - SIX BENIGN LYMPH NODES (0/6). 2. Lymph nodes, regional resection, left pelvic - SEVEN BENIGN LYMPH NODES (0/7). 3. Uterus +/- tubes/ovaries,  neoplastic, with right ovary and fallopian tube ENDOMYOMETRIUM - SEROUS CARCINOMA ARISING WITHIN AN ENDOMETRIAL POLYP - NO MYOMETRIAL INVASION IDENTIFIED - ADENOMYOSIS - LEIOMYOMA (1  CM) - SEE ONCOLOGY TABLE AND COMMENT CERVIX - CARCINOMA FOCALLY INVOLVES ENDOCERVICAL GLANDS - NABOTHIAN CYSTS RIGHT ADNEXA - BENIGN OVARY AND FALLOPIAN TUBE - NO CARCINOMA IDENTIFIED 4. Cul-de-sac biopsy - MESOTHELIAL HYPERPLASIA Microscopic Comment 3. ONCOLOGY TABLE-UTERUS, CARCINOMA OR CARCINOSARCOMA Specimen: Uterus, right fallopian tube and ovary Procedure: Total hysterectomy and right salpingo-oophorectomy Lymph node sampling performed: Bilateral pelvic regional resection Specimen integrity: Intact Maximum tumor size: 3 cm (polyp) Histologic type: Serous carcinoma Grade: High grade Myometrial invasion: Not identified Cervical stromal involvement: No, focal endocervical gland involvement Extent of involvement of other organs: Not identified Lymph - vascular invasion: Not identified Peritoneal washings: N/A Lymph nodes: Examined: 0 Sentinel 13 Non-sentinel 13 Total Lymph nodes with metastasis: 0 Isolated tumor cells (< 0.2 mm): 0 Micrometastasis: (> 0.2 mm and < 2.0 mm): 0 Macrometastasis: (> 2.0 mm): 0 Extracapsular extension: N/A Pelvic lymph nodes: 0 involved of 13 lymph nodes. Para-aortic lymph nodes: No para-aortic nodes submitted TNM code: pT1a, pNX FIGO Stage (based on pathologic findings, needs clinical correlation): IA Comment: Immunohistochemistry for cytokeratin AE1/AE3 is performed on all of the lymph nodes (parts 1 & 2) and no metastatic carcinoma is identified.   07/04/2017 Genetic Testing   The patient had genetic testing due to a personal history of breast and uterine cancer, and a family history of stomach cancer.  The Multi-Cancer Panel was ordered. The Multi-Cancer Panel offered by [facility] includes sequencing and/or deletion duplication testing of the following 83 genes: ALK, APC, ATM, AXIN2,BAP1,  BARD1, BLM, BMPR1A, BRCA1, BRCA2, BRIP1, CASR, CDC73, CDH1, CDK4, CDKN1B, CDKN1C, CDKN2A (p14ARF), CDKN2A (p16INK4a), CEBPA, CHEK2, CTNNA1, DICER1, DIS3L2,  EGFR (c.2369C>T, p.Thr790Met variant only), EPCAM (Deletion/duplication testing only), FH, FLCN, GATA2, GPC3, GREM1 (Promoter region deletion/duplication testing only), HOXB13 (c.251G>A, p.Gly84Glu), HRAS, KIT, MAX, MEN1, MET, MITF (c.952G>A, p.Glu318Lys variant only), MLH1, MSH2, MSH3, MSH6, MUTYH, NBN, NF1, NF2, NTHL1, PALB2, PDGFRA, PHOX2B, PMS2, POLD1, POLE, POT1, PRKAR1A, PTCH1, PTEN, RAD50, RAD51C, RAD51D, RB1, RECQL4, RET, RUNX1, SDHAF2, SDHA (sequence changes only), SDHB, SDHC, SDHD, SMAD4, SMARCA4, SMARCB1, SMARCE1, STK11, SUFU, TERC, TERT, TMEM127, TP53, TSC1, TSC2, VHL, WRN and WT1.   Results: No pathogenic variants were identified.  A variant of uncertain significance in the gene APC was identified.  c.791A>G (p.Gln264Arg).  The date of this test report is 07/04/2017.     Genetic Testing   Patient has genetic testing done for MSI. 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    12/03/2017 Imaging   CT Chest/Abd/Pelvis to follow pulmonary nodule and gastric mass IMPRESSION: 1. Stable CT of the chest. Small pulmonary nodules are unchanged when compared with previous exam. 2. No new findings identified. 3. Subcentimeter low-attenuation lesions within the liver are remain too small to characterize but are stable from prior exam. 4. Persistent indeterminate low-attenuation structure within the proximal stomach is unchanged measuring 1.4 cm. Correlation with direct visualization is advised   06/30/2018 Imaging   CT CHEST Lungs/Pleura: Stable scattered sub-cm pulmonary nodules are again seen bilaterally and are stable compared to previous studies. No new or enlarging pulmonary nodules or masses identified. No evidence of pulmonary infiltrate or pleural effusion.   08/19/2018 Echocardiogram   [place] done in [place]: EF 60-65%. Mild impaired relaxation. (report scanned)   09/17/2018 Relapse/Recurrence   Presented with c/o vagina discharge.  Lesion noted at the left  vaginal apex 6mm lesion removed Path c/w high grade serous cancer   09/17/2018 Pathology Results   Vagina, biopsy, left apex - HIGH GRADE SEROUS CARCINOMA.   09/28/2018 PET scan   1. Two hypermetabolic axial lymph nodes. Unusual site for metastatic endometrial carcinoma however the activity is more intense than typically seen in reactive adenopathy. Suggest ultrasound-guided percutaneous biopsy of the larger RIGHT axial lymph node 2. No evidence of local recurrence at the vaginal cuff.  3. No metastatic adenopathy in the abdomen or pelvis. 4. Stable small pulmonary nodules   10/09/2018 Pathology Results   Lymph node, needle/core biopsy, right axilla - METASTATIC CARCINOMA, SEE COMMENT. Microscopic Comment The carcinoma appears high grade. Immunohistochemistry is positive for cytokeratin 7, PAX-8, and ER. Cytokeratin 20, CDX-2, PR, and GATA-3 are negative. The findings along with the history are consistent with a gynecologic primary.    10/09/2018 Procedure   Ultrasound-guided core biopsies of a right axillary lymph node.   10/14/2018 Cancer Staging   Staging form: Corpus Uteri - Carcinoma and Carcinosarcoma, AJCC 8th Edition - Clinical: Stage IVB (cT1, cN0, pM1) - Signed by Artis Delay, MD on 10/14/2018   10/19/2018 Imaging   Placement of single lumen port a cath via right internal jugular vein. The catheter tip lies at the cavo-atrial junction. A power injectable port a cath was placed and is ready for immediate use.    10/21/2018 -  Chemotherapy   The patient had carboplatin and taxol for treatment   11/11/2018 - 03/12/2022 Chemotherapy   Patient is on Treatment Plan : BREAST Trastuzumab q21d     12/21/2018 PET scan   1. Interval resolution of hypermetabolic right axillary lymph nodes. No metabolic findings highly suspicious for recurrent metastatic disease. 2. New mild hypermetabolism within a borderline prominent portacaval lymph node, nonspecific. While a reactive node is favored, a lymph  node metastasis cannot be entirely excluded. Suggest attention to this lymph node on follow-up PET-CT in 3-6 months. 3. Nonspecific new hypermetabolism at the ileocecal valve, more likely physiologic given absence of CT correlate. 4. Scattered subcentimeter pulmonary nodules are all stable and below PET resolution, more likely benign, continued CT surveillance advised. 5. Chronic findings include: Aortic Atherosclerosis (ICD10-I70.0). Marked diffuse colonic diverticulosis. Coronary atherosclerosis.     12/28/2018 Echocardiogram   1. The left ventricle has normal systolic function with an ejection fraction of 60-65%. The cavity size was normal. Left ventricular diastolic Doppler parameters are consistent with impaired relaxation.  2. GLS recorded as -10.7 but LV appears hyperdynamic and tracking of endocardium appears poor.  3. The right ventricle has normal systolic function. The cavity was normal. There is no increase in right ventricular wall thickness.  4. Mild thickening of the mitral valve leaflet.  5. The aortic valve was not well visualized. Mild thickening of the aortic valve. Aortic valve regurgitation is trivial by color flow Doppler.   03/23/2019 Imaging   PET 1. No findings of hypermetabolic residual/recurrent or metastatic disease. 2. Similar low-level hypermetabolism within normal sized portocaval and right inguinal nodes, favored to be reactive. 3. Ongoing stability of small bilateral pulmonary nodules, favored to be benign. Below PET resolution. 4. Coronary artery atherosclerosis. Aortic Atherosclerosis   03/26/2019 Echocardiogram   1. The left ventricle has normal systolic function, with an ejection fraction of 55-60%. The cavity size was normal. Left ventricular diastolic Doppler parameters are consistent with impaired relaxation.  2. The right ventricle has normal systolic function. The cavity was normal.  3. The mitral valve is  abnormal. Mild thickening of the mitral valve  leaflet. There is mild mitral annular calcification present.  4. The tricuspid valve is grossly normal.  5. The aortic valve is tricuspid. Mild calcification of the aortic valve. No stenosis of the aortic valve.  6. The aorta is normal unless otherwise noted.  7. Normal LV systolic function; grade 1 diastolic dysfunction; GLS-15.2%.   06/30/2019 Imaging   Chest Impression:   1. No evidence thoracic metastasis. 2. Stable small benign-appearing pulmonary nodules   Abdomen / Pelvis Impression:   1. No evidence of metastatic disease in the abdomen pelvis. 2.  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 3.  Aortic Atherosclerosis (ICD10-I70.0).   06/30/2019 Echocardiogram    1. Left ventricular ejection fraction, by visual estimation, is 60 to 65%. The left ventricle has normal function. There is no left ventricular hypertrophy.  2. Left ventricular diastolic parameters are consistent with Grade I diastolic dysfunction (impaired relaxation).  3. The left ventricle has no regional wall motion abnormalities.  4. Global right ventricle has normal systolic function.The right ventricular size is normal. No increase in right ventricular wall thickness.  5. Left atrial size was mild-moderately dilated.  6. Right atrial size was normal.  7. The mitral valve is normal in structure. Trivial mitral valve regurgitation.  8. The tricuspid valve is normal in structure. Tricuspid valve regurgitation is trivial.  9. The aortic valve is normal in structure. Aortic valve regurgitation is trivial. No evidence of aortic valve sclerosis or stenosis. 10. The pulmonic valve was grossly normal. Pulmonic valve regurgitation is not visualized. 11. Mildly elevated pulmonary artery systolic pressure. 12. The inferior vena cava is normal in size with greater than 50% respiratory variability, suggesting right atrial pressure of 3 mmHg. 13. The average left ventricular global longitudinal strain is -12.5 %.  GLS underestimated due to poor endocardial tracking.     09/27/2019 Imaging   1. Multiple small, nonspecific pulmonary nodules are again noted. The previously noted lung nodules are unchanged in size from previous exam. There is a new lung nodule within the medial right lower lobe which is nonspecific measure 4 mm. Attention at follow-up imaging is recommended. 2. No evidence of metastatic disease within the abdomen or pelvis. 3. Coronary artery calcifications.  The 4.  Aortic Atherosclerosis (ICD10-I70.0).   09/30/2019 Echocardiogram    1. Left ventricular ejection fraction, by estimation, is 60 to 65%. The left ventricle has normal function. The left ventricle has no regional wall motion abnormalities. Left ventricular diastolic parameters are consistent with Grade I diastolic dysfunction (impaired relaxation).  2. Right ventricular systolic function is normal. The right ventricular size is normal. There is normal pulmonary artery systolic pressure. The estimated right ventricular systolic pressure is 24.2 mmHg.  3. The mitral valve is normal in structure. No evidence of mitral valve regurgitation. No evidence of mitral stenosis.  4. The aortic valve is normal in structure. Aortic valve regurgitation is trivial. No aortic stenosis is present.  5. The inferior vena cava is normal in size with greater than 50% respiratory variability, suggesting right atrial pressure of 3 mmHg.     01/07/2020 Echocardiogram    1. Normal LV function; grade 1 diastolic dysfunction; mild AI; GLS -21%.  2. Left ventricular ejection fraction, by estimation, is 55 to 60%. The left ventricle has normal function. The left ventricle has no regional wall motion abnormalities. Left ventricular diastolic parameters are consistent with Grade I diastolic dysfunction (impaired relaxation).  3. Right ventricular systolic function is  normal. The right ventricular size is normal.  4. The mitral valve is normal in structure. Trivial  mitral valve regurgitation. No evidence of mitral stenosis.  5. The aortic valve is tricuspid. Aortic valve regurgitation is mild. Mild aortic valve sclerosis is present, with no evidence of aortic valve stenosis.  6. The inferior vena cava is normal in size with greater than 50% respiratory variability, suggesting right atrial pressure of 3 mmHg.     03/27/2020 Imaging   1. Status post hysterectomy. No evidence of recurrent or metastatic disease in the abdomen or pelvis. 2. Multiple small pulmonary nodules in the lung bases are unchanged to the extent that they are included on current examination and remain most likely sequelae of prior infection or inflammation. Attention on follow-up. 3. Aortic Atherosclerosis (ICD10-I70.0).   04/17/2020 Echocardiogram   1. Normal LVEF, normal and unchanged GLS: -20.2%.  2. Left ventricular ejection fraction, by estimation, is 60 to 65%. The left ventricle has normal function. The left ventricle has no regional wall motion abnormalities. There is mild concentric left ventricular hypertrophy. Left ventricular diastolic parameters are consistent with Grade I diastolic dysfunction (impaired relaxation). The average left ventricular global longitudinal strain is -20.2 %. The global longitudinal strain is normal.  3. Right ventricular systolic function is normal. The right ventricular size is normal.  4. The mitral valve is normal in structure. Mild mitral valve regurgitation. No evidence of mitral stenosis.  5. The aortic valve is normal in structure. There is mild calcification of the aortic valve. There is mild thickening of the aortic valve. Aortic valve regurgitation is mild. No aortic stenosis is present.  6. The inferior vena cava is normal in size with greater than 50% respiratory variability, suggesting right atrial pressure of 3 mmHg.   08/02/2020 Echocardiogram   1. Left ventricular ejection fraction, by estimation, is 55 to 60%. The left ventricle has  normal function. The left ventricle has no regional wall motion abnormalities. Left ventricular diastolic parameters are consistent with Grade I diastolic dysfunction (impaired relaxation). The average left ventricular global longitudinal strain is -27.0 %. The global longitudinal strain is normal.  2. Right ventricular systolic function is normal. The right ventricular size is normal. Tricuspid regurgitation signal is inadequate for assessing PA pressure.  3. The mitral valve is grossly normal. Trivial mitral valve regurgitation. No evidence of mitral stenosis.  4. The aortic valve is tricuspid. Aortic valve regurgitation is mild. No aortic stenosis is present.  5. The inferior vena cava is normal in size with greater than 50% respiratory variability, suggesting right atrial pressure of 3 mmHg.   09/19/2020 Imaging   1. Status post hysterectomy. No evidence of recurrent or new metastatic disease within the chest, abdomen, or pelvis. 2. Scattered bilateral tiny pulmonary nodules, unchanged from prior studies and most likely sequela of prior infection or inflammation, recommend attention on follow-up imaging. No new suspicious pulmonary nodules or masses. 3. Subcutaneous edema and small hematoma overlying the posterior gluteal musculature, recommend correlation with recent history of trauma. 4. Mild low-density wall thickening of the gastric antrum, which may represent gastritis. 5. Hepatic steatosis. 6. Extensive sigmoid colonic diverticulosis without findings of acute diverticulitis. 7. Aortic atherosclerosis.  Aortic Atherosclerosis (ICD10-I70.0).   11/20/2020 Echocardiogram    1. Compared to echo from Jan 2022, Global longitudinal strain is less negative. (Previously -27%). Left ventricular ejection fraction, by estimation, is 60 to 65%. The left ventricle has normal function. The left ventricle has no regional wall motion abnormalities. There is  mild left ventricular hypertrophy. Left ventricular  diastolic parameters are indeterminate.  2. Right ventricular systolic function is normal. The right ventricular size is normal.  3. The mitral valve is normal in structure. Trivial mitral valve regurgitation.  4. Aortic valve regurgitation is mild. Mild aortic valve sclerosis is present, with no evidence of aortic valve stenosis.     03/12/2021 Echocardiogram   1. Left ventricular ejection fraction, by estimation, is 60 to 65%. Left ventricular ejection fraction by 2D MOD biplane is 63.6 %. The left ventricle has normal function. The left ventricle has no regional wall motion abnormalities. There is mild left ventricular hypertrophy. Left ventricular diastolic parameters are consistent with Grade I diastolic dysfunction (impaired relaxation). The average left ventricular global longitudinal strain is -20.0 %. The global longitudinal strain is normal.  2. Right ventricular systolic function is normal. The right ventricular size is normal. There is normal pulmonary artery systolic pressure. The estimated right ventricular systolic pressure is 20.0 mmHg.  3. The mitral valve is normal in structure. No evidence of mitral valve regurgitation. No evidence of mitral stenosis.  4. The aortic valve is tricuspid. Aortic valve regurgitation is trivial. No aortic stenosis is present. Aortic regurgitation PHT measures 403 msec.  5. The inferior vena cava is normal in size with greater than 50% respiratory variability, suggesting right atrial pressure of 3 mmHg.   04/02/2021 Imaging   Stable small pulmonary nodules scattered throughout the chest, unchanged, without new or suspicious pulmonary nodule.   Three-vessel coronary artery disease.   Aortic Atherosclerosis (ICD10-I70.0).   06/06/2021 Imaging    1. Left ventricular ejection fraction, by estimation, is 60 to 65%. The left ventricle has normal function. The left ventricle has no regional wall motion abnormalities. Left ventricular diastolic parameters are  consistent with Grade I diastolic dysfunction (impaired relaxation).  2. Right ventricular systolic function is normal. The right ventricular size is normal. There is normal pulmonary artery systolic pressure. The estimated right ventricular systolic pressure is 28.6 mmHg.  3. Left atrial size was mildly dilated.  4. The mitral valve is normal in structure. Trivial mitral valve regurgitation.  5. The aortic valve is tricuspid. Aortic valve regurgitation is mild. No aortic stenosis is present.  6. The inferior vena cava is normal in size with greater than 50% respiratory variability, suggesting right atrial pressure of 3 mmHg.  7. GLS attempted but not reported due to suboptimal tracking.     09/25/2021 Echocardiogram    1. Left ventricular ejection fraction, by estimation, is 60 to 65%. Left ventricular ejection fraction by 3D volume is 56 %. The left ventricle has normal function. The left ventricle has no regional wall motion abnormalities. Left ventricular diastolic parameters were normal.  2. Right ventricular systolic function is normal. The right ventricular size is normal. There is normal pulmonary artery systolic pressure.  3. The mitral valve is normal in structure. Trivial mitral valve regurgitation. No evidence of mitral stenosis.  4. The aortic valve is tricuspid. Aortic valve regurgitation is mild. No aortic stenosis is present.  5. The inferior vena cava is normal in size with greater than 50% respiratory variability, suggesting right atrial pressure of 3 mmHg.   10/15/2021 Imaging   1. Stable examination post prior hysterectomy without evidence of local recurrence or metastatic disease within the chest, abdomen, or pelvis. 2. Unchanged small bilateral pulmonary nodules, likely benign. 3. Left-sided colonic diverticulosis without findings of acute diverticulitis. 4. Aortic Atherosclerosis (ICD10-I70.0).       12/31/2021 Echocardiogram  1. Left ventricular ejection fraction, by  estimation, is 65 to 70%. The left ventricle has normal function. The left ventricle has no regional wall motion abnormalities. Left ventricular diastolic parameters are consistent with Grade I diastolic dysfunction (impaired relaxation).  2. Right ventricular systolic function is normal. The right ventricular size is normal. There is normal pulmonary artery systolic pressure. The estimated right ventricular systolic pressure is 27.4 mmHg.  3. The mitral valve is grossly normal. Trivial mitral valve regurgitation. No evidence of mitral stenosis.  4. The aortic valve is tricuspid. Aortic valve regurgitation is mild. No aortic stenosis is present.  5. The inferior vena cava is normal in size with greater than 50% respiratory variability, suggesting right atrial pressure of 3 mmHg.     04/02/2022 -  Chemotherapy   Patient is on Treatment Plan : BREAST MAINTENANCE Trastuzumab IV (6) or SQ (600) D1 q21d X 11 Cycles     04/04/2022 Echocardiogram    1. Left ventricular ejection fraction, by estimation, is 65 to 70%. The left ventricle has normal function. The left ventricle has no regional wall motion abnormalities. Left ventricular diastolic parameters are consistent with Grade I diastolic dysfunction (impaired relaxation).  2. Right ventricular systolic function is normal. The right ventricular size is normal.  3. Left atrial size was mildly dilated.  4. The mitral valve is normal in structure. Trivial mitral valve regurgitation. No evidence of mitral stenosis.  5. The aortic valve is tricuspid. There is mild calcification of the aortic valve. Aortic valve regurgitation is mild. No aortic stenosis is present. Aortic regurgitation PHT measures 613 msec. Aortic valve area, by VTI measures 1.70 cm. Aortic valve mean gradient measures 3.0 mmHg. Aortic valve Vmax measures 1.15 m/s.  6. The inferior vena cava is normal in size with greater than 50% respiratory variability, suggesting right atrial pressure of 3  mmHg.   07/02/2022 Echocardiogram   1. Left ventricular ejection fraction, by estimation, is 60 to 65%. Left ventricular ejection fraction by 3D volume is 58 %. The left ventricle has normal function. The left ventricle has no regional wall motion abnormalities. Left ventricular diastolic parameters are consistent with Grade I diastolic dysfunction (impaired relaxation). The average left ventricular global longitudinal strain is -20.7 %. The global longitudinal strain is normal.  2. Right ventricular systolic function is normal. The right ventricular size is normal. Tricuspid regurgitation signal is inadequate for assessing PA pressure.  3. Left atrial size was mildly dilated.  4. The mitral valve is normal in structure. Trivial mitral valve regurgitation. No evidence of mitral stenosis.  5. The aortic valve is grossly normal. Aortic valve regurgitation is mild. No aortic stenosis is present.  6. The inferior vena cava is normal in size with greater than 50% respiratory variability, suggesting right atrial pressure of 3 mmHg.   11/05/2022 Echocardiogram       1. Left ventricular ejection fraction, by estimation, is 55 to 60%. Left ventricular ejection fraction by 3D volume is 59 %. The left ventricle has normal function. The left ventricle has no regional wall motion abnormalities. Left ventricular diastolic  parameters are consistent with Grade I diastolic dysfunction (impaired relaxation). The average left ventricular global longitudinal strain is -16.8 %. The global longitudinal strain is normal.  2. Right ventricular systolic function is normal. The right ventricular size is normal. There is normal pulmonary artery systolic pressure.  3. The mitral valve is normal in structure. No evidence of mitral valve regurgitation. No evidence of mitral stenosis.  4.  The aortic valve is normal in structure. Aortic valve regurgitation is mild. No aortic stenosis is present.  5. The inferior vena cava is  normal in size with greater than 50% respiratory variability, suggesting right atrial pressure of 3 mmHg.     11/07/2022 Imaging   CT CHEST ABDOMEN PELVIS W CONTRAST  Result Date: 11/07/2022 CLINICAL DATA:  Cervical cancer. Assess treatment response. On chemotherapy * Tracking Code: BO * EXAM: CT CHEST, ABDOMEN, AND PELVIS WITH CONTRAST TECHNIQUE: Multidetector CT imaging of the chest, abdomen and pelvis was performed following the standard protocol during bolus administration of intravenous contrast. RADIATION DOSE REDUCTION: This exam was performed according to the departmental dose-optimization program which includes automated exposure control, adjustment of the mA and/or kV according to patient size and/or use of iterative reconstruction technique. CONTRAST:  OMNIPAQUE IOHEXOL 300 MG/ML  SOLN COMPARISON:  CT 10/15/2021 and older FINDINGS: CT CHEST FINDINGS Cardiovascular: Right upper chest port. Heart is nonenlarged. No pericardial effusion. The thoracic aorta has a normal course and caliber with scattered vascular calcifications. There is a bovine type aortic arch, a normal variant. Mediastinum/Nodes: Heterogeneous small thyroid gland with a nodular area in the right is unchanged from previous. As stated previously, no specific imaging follow-up. No specific abnormal lymph node enlargement seen in the axillary regions, hilum or mediastinum. Surgical clips in the left axillary region. Breast implants are noted. Normal caliber thoracic esophagus. Lungs/Pleura: There is some linear opacity lung bases likely scar or atelectasis. No pleural effusion. No consolidation or pneumothorax. There are a few scattered tiny lung nodules identified. Example 3 mm superior segment left lower lobe on series 6, image 49 which is stable. Lingular nodule measuring 4 mm on the prior, today is stable on series 6, image 73. 3 mm nodule along the margin of the minor fissure on the right is also stable. There is a larger  ill-defined areas seen right lung on the prior examination series 4, image 63 which has resolved. This may have been infectious or inflammatory. There is a small area of bronchiectasis in this location today on series 6, image 58. No new dominant lung nodule. Musculoskeletal: Diffuse moderate degenerative changes of the spine. CT ABDOMEN PELVIS FINDINGS Hepatobiliary: Fatty liver infiltration. There are some tiny low-attenuation lesion seen in the liver which are too small to completely characterize but unchanged from previous. Simple attention on follow-up for the patient's neoplasm. Example series 2, image 44 anterior to branches of the right hepatic vein. Gallbladder is nondilated. Patent portal vein. Pancreas: Unremarkable. No pancreatic ductal dilatation or surrounding inflammatory changes. Spleen: Normal in size without focal abnormality. Adrenals/Urinary Tract: The adrenal glands are preserved. No enhancing renal mass. There are some tiny low-attenuation lesion seen along each kidney which are too small to completely characterize likely small cysts. No specific imaging follow-up. Bosniak 2 lesions. Contracted urinary bladder. Stomach/Bowel: Large bowel has extensive left-sided colonic stool. Extensive colonic diverticulosis as well. The large bowel is nondilated. Small bowel is nondilated. There is a nodular area along the upper aspect of the stomach fold thickening. On series 2, image 45 this measures 18 by 14 mm. This is not well seen previously but on the prior study the stomach is more collapsed. There may have been something in retrospect in this location. There also is some nodular areas along the antrum. Recommend further evaluation to exclude underlying lesion. Vascular/Lymphatic: Normal caliber aorta and IVC with scattered vascular calcifications. No specific abnormal lymph node enlargement identified in the abdomen and  pelvis. Reproductive: Status post hysterectomy. No adnexal masses. Other: No free  air or free fluid. Musculoskeletal: Moderate degenerative changes of the spine and pelvis. Trace anterolisthesis at L4-5 and L5-S1. 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 IMPRESSION: Stable tiny multiple pulmonary nodules. 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 No developing lymph node enlargement or fluid collection. Colonic diverticulosis of the sigmoid colon with moderate stool. There are nodular masslike areas seen in the stomach including towards the fundus and small area towards the antrum with fold thickening. Hyperplastic polyps are possible. Recommend further workup of the stomach when appropriate to exclude an aggressive process. Electronically Signed   By: Karen Kays M.D.   On: 11/07/2022 13:39   ECHOCARDIOGRAM COMPLETE  Result Date: 11/05/2022    ECHOCARDIOGRAM REPORT   Patient Name:   Vicki Cervantes Date of Exam: 11/05/2022 Medical Rec #:  [id number]      Height:       62.0 in Accession #:    [id number]     Weight:       134.8 lb Date of Birth:  1940/08/20      BSA:          1.617 m Patient Age:    81 years       BP:           144/72 mmHg Patient Gender: F              HR:           69 bpm. Exam Location:  Outpatient Procedure: 2D Echo, 3D Echo, Cardiac Doppler, Color Doppler and Strain Analysis Indications:    chemo  History:        Patient has prior history of Echocardiogram examinations. Risk                 Factors:Hypertension.  Sonographer:    Mike Gip Referring Phys: [id number] Dione Petron IMPRESSIONS  1. Left ventricular ejection fraction, by estimation, is 55 to 60%. Left ventricular ejection fraction by 3D volume is 59 %. The left ventricle has normal function. The left ventricle has no regional wall motion abnormalities. Left ventricular diastolic  parameters are consistent with Grade I diastolic dysfunction (impaired relaxation). The average left ventricular global longitudinal strain is -16.8 %. The global longitudinal strain is normal.  2. Right  ventricular systolic function is normal. The right ventricular size is normal. There is normal pulmonary artery systolic pressure.  3. The mitral valve is normal in structure. No evidence of mitral valve regurgitation. No evidence of mitral stenosis.  4. The aortic valve is normal in structure. Aortic valve regurgitation is mild. No aortic stenosis is present.  5. The inferior vena cava is normal in size with greater than 50% respiratory variability, suggesting right atrial pressure of 3 mmHg. Comparison(s): Prior 07/01/2022: EF 60%, GLS -20.7% Prior 03/2022: GLS-15.3. FINDINGS  Left Ventricle: Left ventricular ejection fraction, by estimation, is 55 to 60%. Left ventricular ejection fraction by 3D volume is 59 %. The left ventricle has normal function. The left ventricle has no regional wall motion abnormalities. The average left ventricular global longitudinal strain is -16.8 %. The global longitudinal strain is normal. The left ventricular internal cavity size was normal in size. There is no left ventricular hypertrophy. Left ventricular diastolic parameters are consistent  with Grade I diastolic dysfunction (impaired relaxation). Right Ventricle: The right ventricular size is normal. No increase in right ventricular wall thickness. Right ventricular systolic function is normal. There is  normal pulmonary artery systolic pressure. The tricuspid regurgitant velocity is 1.79 m/s, and  with an assumed right atrial pressure of 3 mmHg, the estimated right ventricular systolic pressure is 15.8 mmHg. Left Atrium: Left atrial size was normal in size. Right Atrium: Right atrial size was normal in size. Pericardium: There is no evidence of pericardial effusion. Mitral Valve: The mitral valve is normal in structure. No evidence of mitral valve regurgitation. No evidence of mitral valve stenosis. Tricuspid Valve: The tricuspid valve is normal in structure. Tricuspid valve regurgitation is not demonstrated. No evidence of  tricuspid stenosis. Aortic Valve: The aortic valve is normal in structure. Aortic valve regurgitation is mild. Aortic regurgitation PHT measures 497 msec. No aortic stenosis is present. Pulmonic Valve: The pulmonic valve was normal in structure. Pulmonic valve regurgitation is mild. No evidence of pulmonic stenosis. Aorta: The aortic root is normal in size and structure. Venous: The inferior vena cava is normal in size with greater than 50% respiratory variability, suggesting right atrial pressure of 3 mmHg. IAS/Shunts: No atrial level shunt detected by color flow Doppler.  LEFT VENTRICLE PLAX 2D LVIDd:         3.80 cm         Diastology LVIDs:         2.50 cm         LV e' medial:    7.51 cm/s LV PW:         1.00 cm         LV E/e' medial:  7.0 LV IVS:        1.00 cm         LV e' lateral:   8.16 cm/s LVOT diam:     1.90 cm         LV E/e' lateral: 6.4 LV SV:         49 LV SV Index:   30              2D LVOT Area:     2.84 cm        Longitudinal                                Strain                                2D Strain GLS  -16.8 % LV Volumes (MOD)               Avg: LV vol d, MOD    62.3 ml A2C:                           3D Volume EF LV vol d, MOD    68.5 ml       LV 3D EF:    Left A4C:                                        ventricul LV vol s, MOD    25.6 ml                    ar A2C:  ejection LV vol s, MOD    24.1 ml                    fraction A4C:                                        by 3D LV SV MOD A2C:   36.7 ml                    volume is LV SV MOD A4C:   68.5 ml                    59 %. LV SV MOD BP:    39.9 ml                                 3D Volume EF:                                3D EF:        59 %                                LV EDV:       116 ml                                LV ESV:       48 ml                                LV SV:        68 ml RIGHT VENTRICLE             IVC RV Basal diam:  3.00 cm     IVC diam: 1.40 cm RV S prime:     10.20 cm/s  TAPSE (M-mode): 1.9 cm LEFT ATRIUM             Index        RIGHT ATRIUM           Index LA diam:        3.40 cm 2.10 cm/m   RA Area:     11.20 cm LA Vol (A2C):   49.4 ml 30.56 ml/m  RA Volume:   22.50 ml  13.92 ml/m LA Vol (A4C):   28.3 ml 17.51 ml/m LA Biplane Vol: 38.6 ml 23.88 ml/m  AORTIC VALVE LVOT Vmax:   79.80 cm/s LVOT Vmean:  49.400 cm/s LVOT VTI:    0.173 m AI PHT:      497 msec  AORTA Ao Root diam: 3.20 cm MITRAL VALVE               TRICUSPID VALVE MV Area (PHT): 2.84 cm    TR Peak grad:   12.8 mmHg MV Decel Time: 267 msec    TR Vmax:        179.00 cm/s MV E velocity: 52.40 cm/s MV A velocity: 69.80 cm/s  SHUNTS MV E/A ratio:  0.75        Systemic VTI:  0.17 m  Systemic Diam: 1.90 cm Donato Schultz MD Electronically signed by Donato Schultz MD Signature Date/Time: 11/05/2022/2:11:35 PM    Final       Metastasis to lymph nodes  10/14/2018 Initial Diagnosis   Metastasis to lymph nodes (HCC)   10/21/2018 - 02/24/2019 Chemotherapy   The patient had palonosetron (ALOXI) injection 0.25 mg, 0.25 mg, Intravenous,  Once, 7 of 7 cycles Administration: 0.25 mg (10/21/2018), 0.25 mg (11/11/2018), 0.25 mg (12/02/2018), 0.25 mg (12/23/2018), 0.25 mg (01/13/2019), 0.25 mg (02/03/2019), 0.25 mg (02/24/2019) CARBOplatin (PARAPLATIN) 310 mg in sodium chloride 0.9 % 250 mL chemo infusion, 310 mg, Intravenous,  Once, 7 of 7 cycles Dose modification: 300 mg (original dose 311.5 mg, Cycle 4, Reason: Dose not tolerated) Administration: 310 mg (10/21/2018), 300 mg (11/11/2018), 300 mg (12/02/2018), 300 mg (12/23/2018), 300 mg (01/13/2019), 300 mg (02/03/2019), 300 mg (02/24/2019) PACLitaxel (TAXOL) 222 mg in sodium chloride 0.9 % 250 mL chemo infusion (> 80mg /m2), 135 mg/m2 = 222 mg, Intravenous,  Once, 7 of 7 cycles Administration: 222 mg (10/21/2018), 222 mg (11/11/2018), 222 mg (12/02/2018), 222 mg (12/23/2018), 222 mg (01/13/2019), 222 mg (02/03/2019), 222 mg (02/24/2019) fosaprepitant (EMEND) 150 mg, dexamethasone  (DECADRON) 12 mg in sodium chloride 0.9 % 145 mL IVPB, , Intravenous,  Once, 7 of 7 cycles Administration:  (10/21/2018),  (11/11/2018),  (12/02/2018),  (12/23/2018),  (01/13/2019),  (02/03/2019),  (02/24/2019)  for chemotherapy treatment.    04/02/2022 -  Chemotherapy   Patient is on Treatment Plan : BREAST MAINTENANCE Trastuzumab IV (6) or SQ (600) D1 q21d X 11 Cycles       PHYSICAL EXAMINATION: ECOG PERFORMANCE STATUS: 1 - Symptomatic but completely ambulatory  Vitals:   11/07/22 1131  BP: (!) 167/65  Pulse: 71  Resp: 18  Temp: 97.8 F (36.6 C)  SpO2: 95%   Filed Weights   11/07/22 1131  Weight: 136 lb 12.8 oz (62.1 kg)    GENERAL:alert, no distress and comfortable NEURO: alert & oriented x 3 with fluent speech, no focal motor/sensory deficits  LABORATORY DATA:  I have reviewed the data as listed    Component Value Date/Time   NA 135 11/05/2022 1215   NA 139 02/13/2017 1512   K 3.6 11/05/2022 1215   K 4.2 02/13/2017 1512   CL 99 11/05/2022 1215   CO2 27 11/05/2022 1215   CO2 28 02/13/2017 1512   GLUCOSE 93 11/05/2022 1215   GLUCOSE 118 02/13/2017 1512   BUN 21 11/05/2022 1215   BUN 25.2 02/13/2017 1512   CREATININE 1.20 (H) 11/05/2022 1215   CREATININE 1.2 (H) 02/13/2017 1512   CALCIUM 9.4 11/05/2022 1215   CALCIUM 10.0 02/13/2017 1512   PROT 6.8 11/05/2022 1215   ALBUMIN 4.2 11/05/2022 1215   AST 19 11/05/2022 1215   ALT 12 11/05/2022 1215   ALKPHOS 55 11/05/2022 1215   BILITOT 0.4 11/05/2022 1215   GFRNONAA 45 (L) 11/05/2022 1215   GFRAA 47 (L) 03/28/2020 0933   GFRAA 42 (L) 12/21/2019 0850    No results found for: "SPEP", "UPEP"  Lab Results  Component Value Date   WBC 6.5 11/05/2022   NEUTROABS 4.4 11/05/2022   HGB 11.5 (L) 11/05/2022   HCT 31.0 (L) 11/05/2022   MCV 87.6 11/05/2022   PLT 251 11/05/2022      Chemistry      Component Value Date/Time   NA 135 11/05/2022 1215   NA 139 02/13/2017 1512   K 3.6 11/05/2022 1215   K 4.2 02/13/2017 1512  CL 99 11/05/2022 1215   CO2 27 11/05/2022 1215   CO2 28 02/13/2017 1512   BUN 21 11/05/2022 1215   BUN 25.2 02/13/2017 1512   CREATININE 1.20 (H) 11/05/2022 1215   CREATININE 1.2 (H) 02/13/2017 1512      Component Value Date/Time   CALCIUM 9.4 11/05/2022 1215   CALCIUM 10.0 02/13/2017 1512   ALKPHOS 55 11/05/2022 1215   AST 19 11/05/2022 1215   ALT 12 11/05/2022 1215   BILITOT 0.4 11/05/2022 1215       RADIOGRAPHIC STUDIES: I reviewed imaging studies with the patient I have personally reviewed the radiological images as listed and agreed with the findings in the report. CT CHEST ABDOMEN PELVIS W CONTRAST  Result Date: 11/07/2022 CLINICAL DATA:  Cervical cancer. Assess treatment response. On chemotherapy * Tracking Code: BO * EXAM: CT CHEST, ABDOMEN, AND PELVIS WITH CONTRAST TECHNIQUE: Multidetector CT imaging of the chest, abdomen and pelvis was performed following the standard protocol during bolus administration of intravenous contrast. RADIATION DOSE REDUCTION: This exam was performed according to the departmental dose-optimization program which includes automated exposure control, adjustment of the mA and/or kV according to patient size and/or use of iterative reconstruction technique. CONTRAST:  OMNIPAQUE IOHEXOL 300 MG/ML  SOLN COMPARISON:  CT 10/15/2021 and older FINDINGS: CT CHEST FINDINGS Cardiovascular: Right upper chest port. Heart is nonenlarged. No pericardial effusion. The thoracic aorta has a normal course and caliber with scattered vascular calcifications. There is a bovine type aortic arch, a normal variant. Mediastinum/Nodes: Heterogeneous small thyroid gland with a nodular area in the right is unchanged from previous. As stated previously, no specific imaging follow-up. No specific abnormal lymph node enlargement seen in the axillary regions, hilum or mediastinum. Surgical clips in the left axillary region. Breast implants are noted. Normal caliber thoracic  esophagus. Lungs/Pleura: There is some linear opacity lung bases likely scar or atelectasis. No pleural effusion. No consolidation or pneumothorax. There are a few scattered tiny lung nodules identified. Example 3 mm superior segment left lower lobe on series 6, image 49 which is stable. Lingular nodule measuring 4 mm on the prior, today is stable on series 6, image 73. 3 mm nodule along the margin of the minor fissure on the right is also stable. There is a larger ill-defined areas seen right lung on the prior examination series 4, image 63 which has resolved. This may have been infectious or inflammatory. There is a small area of bronchiectasis in this location today on series 6, image 58. No new dominant lung nodule. Musculoskeletal: Diffuse moderate degenerative changes of the spine. CT ABDOMEN PELVIS FINDINGS Hepatobiliary: Fatty liver infiltration. There are some tiny low-attenuation lesion seen in the liver which are too small to completely characterize but unchanged from previous. Simple attention on follow-up for the patient's neoplasm. Example series 2, image 44 anterior to branches of the right hepatic vein. Gallbladder is nondilated. Patent portal vein. Pancreas: Unremarkable. No pancreatic ductal dilatation or surrounding inflammatory changes. Spleen: Normal in size without focal abnormality. Adrenals/Urinary Tract: The adrenal glands are preserved. No enhancing renal mass. There are some tiny low-attenuation lesion seen along each kidney which are too small to completely characterize likely small cysts. No specific imaging follow-up. Bosniak 2 lesions. Contracted urinary bladder. Stomach/Bowel: Large bowel has extensive left-sided colonic stool. Extensive colonic diverticulosis as well. The large bowel is nondilated. Small bowel is nondilated. There is a nodular area along the upper aspect of the stomach fold thickening. On series 2, image 45  this measures 18 by 14 mm. This is not well seen previously  but on the prior study the stomach is more collapsed. There may have been something in retrospect in this location. There also is some nodular areas along the antrum. Recommend further evaluation to exclude underlying lesion. Vascular/Lymphatic: Normal caliber aorta and IVC with scattered vascular calcifications. No specific abnormal lymph node enlargement identified in the abdomen and pelvis. Reproductive: Status post hysterectomy. No adnexal masses. Other: No free air or free fluid. Musculoskeletal: Moderate degenerative changes of the spine and pelvis. Trace anterolisthesis at L4-5 and L5-S1. 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 IMPRESSION: Stable tiny multiple pulmonary nodules. 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 No developing lymph node enlargement or fluid collection. Colonic diverticulosis of the sigmoid colon with moderate stool. There are nodular masslike areas seen in the stomach including towards the fundus and small area towards the antrum with fold thickening. Hyperplastic polyps are possible. Recommend further workup of the stomach when appropriate to exclude an aggressive process. Electronically Signed   By: Karen Kays M.D.   On: 11/07/2022 13:39   ECHOCARDIOGRAM COMPLETE  Result Date: 11/05/2022    ECHOCARDIOGRAM REPORT   Patient Name:   Vicki Cervantes Date of Exam: 11/05/2022 Medical Rec #:  [id number]      Height:       62.0 in Accession #:    [id number]     Weight:       134.8 lb Date of Birth:  [birth year]      BSA:          1.617 m Patient Age:    81 years       BP:           144/72 mmHg Patient Gender: F              HR:           69 bpm. Exam Location:  Outpatient Procedure: 2D Echo, 3D Echo, Cardiac Doppler, Color Doppler and Strain Analysis Indications:    chemo  History:        Patient has prior history of Echocardiogram examinations. Risk                 Factors:Hypertension.  Sonographer:    Mike Gip Referring Phys: [id number] Channie Bostick  IMPRESSIONS  1. Left ventricular ejection fraction, by estimation, is 55 to 60%. Left ventricular ejection fraction by 3D volume is 59 %. The left ventricle has normal function. The left ventricle has no regional wall motion abnormalities. Left ventricular diastolic  parameters are consistent with Grade I diastolic dysfunction (impaired relaxation). The average left ventricular global longitudinal strain is -16.8 %. The global longitudinal strain is normal.  2. Right ventricular systolic function is normal. The right ventricular size is normal. There is normal pulmonary artery systolic pressure.  3. The mitral valve is normal in structure. No evidence of mitral valve regurgitation. No evidence of mitral stenosis.  4. The aortic valve is normal in structure. Aortic valve regurgitation is mild. No aortic stenosis is present.  5. The inferior vena cava is normal in size with greater than 50% respiratory variability, suggesting right atrial pressure of 3 mmHg. Comparison(s): Prior 07/01/2022: EF 60%, GLS -20.7% Prior 03/2022: GLS-15.3. FINDINGS  Left Ventricle: Left ventricular ejection fraction, by estimation, is 55 to 60%. Left ventricular ejection fraction by 3D volume is 59 %. The left ventricle has normal function. The left ventricle has no regional wall motion abnormalities.  The average left ventricular global longitudinal strain is -16.8 %. The global longitudinal strain is normal. The left ventricular internal cavity size was normal in size. There is no left ventricular hypertrophy. Left ventricular diastolic parameters are consistent  with Grade I diastolic dysfunction (impaired relaxation). Right Ventricle: The right ventricular size is normal. No increase in right ventricular wall thickness. Right ventricular systolic function is normal. There is normal pulmonary artery systolic pressure. The tricuspid regurgitant velocity is 1.79 m/s, and  with an assumed right atrial pressure of 3 mmHg, the estimated right  ventricular systolic pressure is 15.8 mmHg. Left Atrium: Left atrial size was normal in size. Right Atrium: Right atrial size was normal in size. Pericardium: There is no evidence of pericardial effusion. Mitral Valve: The mitral valve is normal in structure. No evidence of mitral valve regurgitation. No evidence of mitral valve stenosis. Tricuspid Valve: The tricuspid valve is normal in structure. Tricuspid valve regurgitation is not demonstrated. No evidence of tricuspid stenosis. Aortic Valve: The aortic valve is normal in structure. Aortic valve regurgitation is mild. Aortic regurgitation PHT measures 497 msec. No aortic stenosis is present. Pulmonic Valve: The pulmonic valve was normal in structure. Pulmonic valve regurgitation is mild. No evidence of pulmonic stenosis. Aorta: The aortic root is normal in size and structure. Venous: The inferior vena cava is normal in size with greater than 50% respiratory variability, suggesting right atrial pressure of 3 mmHg. IAS/Shunts: No atrial level shunt detected by color flow Doppler.  LEFT VENTRICLE PLAX 2D LVIDd:         3.80 cm         Diastology LVIDs:         2.50 cm         LV e' medial:    7.51 cm/s LV PW:         1.00 cm         LV E/e' medial:  7.0 LV IVS:        1.00 cm         LV e' lateral:   8.16 cm/s LVOT diam:     1.90 cm         LV E/e' lateral: 6.4 LV SV:         49 LV SV Index:   30              2D LVOT Area:     2.84 cm        Longitudinal                                Strain                                2D Strain GLS  -16.8 % LV Volumes (MOD)               Avg: LV vol d, MOD    62.3 ml A2C:                           3D Volume EF LV vol d, MOD    68.5 ml       LV 3D EF:    Left A4C:  ventricul LV vol s, MOD    25.6 ml                    ar A2C:                                        ejection LV vol s, MOD    24.1 ml                    fraction A4C:                                        by 3D LV SV MOD A2C:    36.7 ml                    volume is LV SV MOD A4C:   68.5 ml                    59 %. LV SV MOD BP:    39.9 ml                                 3D Volume EF:                                3D EF:        59 %                                LV EDV:       116 ml                                LV ESV:       48 ml                                LV SV:        68 ml RIGHT VENTRICLE             IVC RV Basal diam:  3.00 cm     IVC diam: 1.40 cm RV S prime:     10.20 cm/s TAPSE (M-mode): 1.9 cm LEFT ATRIUM             Index        RIGHT ATRIUM           Index LA diam:        3.40 cm 2.10 cm/m   RA Area:     11.20 cm LA Vol (A2C):   49.4 ml 30.56 ml/m  RA Volume:   22.50 ml  13.92 ml/m LA Vol (A4C):   28.3 ml 17.51 ml/m LA Biplane Vol: 38.6 ml 23.88 ml/m  AORTIC VALVE LVOT Vmax:   79.80 cm/s LVOT Vmean:  49.400 cm/s LVOT VTI:    0.173 m AI PHT:      497 msec  AORTA Ao Root diam: 3.20 cm MITRAL VALVE               TRICUSPID  VALVE MV Area (PHT): 2.84 cm    TR Peak grad:   12.8 mmHg MV Decel Time: 267 msec    TR Vmax:        179.00 cm/s MV E velocity: 52.40 cm/s MV A velocity: 69.80 cm/s  SHUNTS MV E/A ratio:  0.75        Systemic VTI:  0.17 m                            Systemic Diam: 1.90 cm Donato Schultz MD Electronically signed by Donato Schultz MD Signature Date/Time: 11/05/2022/2:11:35 PM    Final

## 2022-11-07 NOTE — Patient Instructions (Signed)
[facility]  Discharge Instructions: Thank you for choosing [facility] to provide your oncology and hematology care.   If you have a lab appointment with the Cancer Center, please go directly to the Cancer Center and check in at the registration area.   Wear comfortable clothing and clothing appropriate for easy access to any Portacath or PICC line.   We strive to give you quality time with your provider. You may need to reschedule your appointment if you arrive late (15 or more minutes).  Arriving late affects you and other patients whose appointments are after yours.  Also, if you miss three or more appointments without notifying the office, you may be dismissed from the clinic at the provider's discretion.      For prescription refill requests, have your pharmacy contact our office and allow 72 hours for refills to be completed.    Today you received the following chemotherapy and/or immunotherapy agents: Trastuzumab      To help prevent nausea and vomiting after your treatment, we encourage you to take your nausea medication as directed.  BELOW ARE SYMPTOMS THAT SHOULD BE REPORTED IMMEDIATELY: *FEVER GREATER THAN 100.4 F (38 C) OR HIGHER *CHILLS OR SWEATING *NAUSEA AND VOMITING THAT IS NOT CONTROLLED WITH YOUR NAUSEA MEDICATION *UNUSUAL SHORTNESS OF BREATH *UNUSUAL BRUISING OR BLEEDING *URINARY PROBLEMS (pain or burning when urinating, or frequent urination) *BOWEL PROBLEMS (unusual diarrhea, constipation, pain near the anus) TENDERNESS IN MOUTH AND THROAT WITH OR WITHOUT PRESENCE OF ULCERS (sore throat, sores in mouth, or a toothache) UNUSUAL RASH, SWELLING OR PAIN  UNUSUAL VAGINAL DISCHARGE OR ITCHING   Items with * indicate a potential emergency and should be followed up as soon as possible or go to the Emergency Department if any problems should occur.  Please show the CHEMOTHERAPY ALERT CARD or IMMUNOTHERAPY ALERT CARD at  check-in to the Emergency Department and triage nurse.  Should you have questions after your visit or need to cancel or reschedule your appointment, please contact [facility]  Dept: [phone]  and follow the prompts.  Office hours are 8:00 a.m. to 4:30 p.m. Monday - Friday. Please note that voicemails left after 4:00 p.m. may not be returned until the following business day.  We are closed weekends and major holidays. You have access to a nurse at all times for urgent questions. Please call the main number to the clinic Dept: [phone] and follow the prompts.   For any non-urgent questions, you may also contact your provider using MyChart. We now offer e-Visits for anyone 18 and older to request care online for non-urgent symptoms. For details visit [email or web address].   Also download the MyChart app! Go to the app store, search "MyChart", open the app, select [facility], and log in with your MyChart username and password.  

## 2022-11-07 NOTE — Assessment & Plan Note (Signed)
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 

## 2022-11-07 NOTE — Assessment & Plan Note (Signed)
Her blood pressure is elevated today but generally normal at home Her recent echocardiogram showed diastolic dysfunction She will continue medical management

## 2022-11-08 ENCOUNTER — Other Ambulatory Visit: Payer: Self-pay

## 2022-11-12 DIAGNOSIS — E785 Hyperlipidemia, unspecified: Secondary | ICD-10-CM | POA: Diagnosis not present

## 2022-11-12 DIAGNOSIS — E039 Hypothyroidism, unspecified: Secondary | ICD-10-CM | POA: Diagnosis not present

## 2022-11-13 ENCOUNTER — Telehealth: Payer: Self-pay

## 2022-11-13 NOTE — Telephone Encounter (Signed)
-----   Message from Hilarie Fredrickson, MD sent at 11/07/2022  3:26 PM EDT ----- Regarding: RE: mutual patient Bonita Quin, Please get this patient a follow-up visit with one of the advanced practitioners, as I am out of the office for the next 3 weeks. Thanks, Dr. Marina Goodell ----- Message ----- From: Artis Delay, MD Sent: 11/07/2022   3:04 PM EDT To: Hilarie Fredrickson, MD Subject: mutual patient                                 Hi,  Miss Pharris had CT 2 days ago to follow on her history of Uterine cancer. Radiologist mentioned a polypoid lesion in her stomach wall I noted that Dr. Christella Hartigan had performed EUS in 2016; I am not sure if it is the same thing Would you mind seeing her back and consider repeating EGD if appropriate?  Thanks, Ni

## 2022-11-13 NOTE — Telephone Encounter (Signed)
Pt scheduled for 1st available app appt with Quentin Mulling PA 12/23/22 at 1:30pm, pt aware of appt.

## 2022-11-14 ENCOUNTER — Other Ambulatory Visit: Payer: Self-pay

## 2022-11-15 DIAGNOSIS — H8112 Benign paroxysmal vertigo, left ear: Secondary | ICD-10-CM | POA: Diagnosis not present

## 2022-11-15 DIAGNOSIS — R270 Ataxia, unspecified: Secondary | ICD-10-CM | POA: Diagnosis not present

## 2022-11-18 DIAGNOSIS — E0590 Thyrotoxicosis, unspecified without thyrotoxic crisis or storm: Secondary | ICD-10-CM | POA: Diagnosis not present

## 2022-11-18 DIAGNOSIS — I10 Essential (primary) hypertension: Secondary | ICD-10-CM | POA: Diagnosis not present

## 2022-11-18 DIAGNOSIS — E785 Hyperlipidemia, unspecified: Secondary | ICD-10-CM | POA: Diagnosis not present

## 2022-11-18 DIAGNOSIS — E039 Hypothyroidism, unspecified: Secondary | ICD-10-CM | POA: Diagnosis not present

## 2022-11-18 DIAGNOSIS — R739 Hyperglycemia, unspecified: Secondary | ICD-10-CM | POA: Diagnosis not present

## 2022-11-18 DIAGNOSIS — N1832 Chronic kidney disease, stage 3b: Secondary | ICD-10-CM | POA: Diagnosis not present

## 2022-11-18 DIAGNOSIS — K219 Gastro-esophageal reflux disease without esophagitis: Secondary | ICD-10-CM | POA: Diagnosis not present

## 2022-11-25 ENCOUNTER — Telehealth: Payer: Self-pay | Admitting: Hematology and Oncology

## 2022-11-27 ENCOUNTER — Inpatient Hospital Stay: Payer: Medicare HMO | Attending: Gynecologic Oncology

## 2022-11-27 VITALS — BP 136/62 | HR 66 | Temp 98.1°F | Resp 16 | Wt 134.0 lb

## 2022-11-27 DIAGNOSIS — N183 Chronic kidney disease, stage 3 unspecified: Secondary | ICD-10-CM | POA: Diagnosis not present

## 2022-11-27 DIAGNOSIS — I129 Hypertensive chronic kidney disease with stage 1 through stage 4 chronic kidney disease, or unspecified chronic kidney disease: Secondary | ICD-10-CM | POA: Insufficient documentation

## 2022-11-27 DIAGNOSIS — C773 Secondary and unspecified malignant neoplasm of axilla and upper limb lymph nodes: Secondary | ICD-10-CM | POA: Insufficient documentation

## 2022-11-27 DIAGNOSIS — Z5112 Encounter for antineoplastic immunotherapy: Secondary | ICD-10-CM | POA: Insufficient documentation

## 2022-11-27 DIAGNOSIS — C541 Malignant neoplasm of endometrium: Secondary | ICD-10-CM | POA: Diagnosis not present

## 2022-11-27 DIAGNOSIS — D631 Anemia in chronic kidney disease: Secondary | ICD-10-CM | POA: Insufficient documentation

## 2022-11-27 DIAGNOSIS — Z7189 Other specified counseling: Secondary | ICD-10-CM

## 2022-11-27 MED ORDER — ACETAMINOPHEN 325 MG PO TABS
650.0000 mg | ORAL_TABLET | Freq: Once | ORAL | Status: AC
  Administered 2022-11-27: 650 mg via ORAL
  Filled 2022-11-27: qty 2

## 2022-11-27 MED ORDER — DIPHENHYDRAMINE HCL 25 MG PO CAPS
25.0000 mg | ORAL_CAPSULE | Freq: Once | ORAL | Status: AC
  Administered 2022-11-27: 25 mg via ORAL
  Filled 2022-11-27: qty 1

## 2022-11-27 MED ORDER — TRASTUZUMAB-ANNS CHEMO 150 MG IV SOLR
6.0000 mg/kg | Freq: Once | INTRAVENOUS | Status: AC
  Administered 2022-11-27: 357 mg via INTRAVENOUS
  Filled 2022-11-27: qty 17

## 2022-11-27 MED ORDER — HEPARIN SOD (PORK) LOCK FLUSH 100 UNIT/ML IV SOLN
500.0000 [IU] | Freq: Once | INTRAVENOUS | Status: AC | PRN
  Administered 2022-11-27: 500 [IU]

## 2022-11-27 MED ORDER — SODIUM CHLORIDE 0.9 % IV SOLN: Freq: Once | INTRAVENOUS | Status: AC

## 2022-11-27 MED ORDER — SODIUM CHLORIDE 0.9% FLUSH
10.0000 mL | INTRAVENOUS | Status: DC | PRN
  Administered 2022-11-27: 10 mL

## 2022-11-27 NOTE — Patient Instructions (Signed)
[facility]  Discharge Instructions: Thank you for choosing [facility] to provide your oncology and hematology care.   If you have a lab appointment with the Cancer Center, please go directly to the Cancer Center and check in at the registration area.   Wear comfortable clothing and clothing appropriate for easy access to any Portacath or PICC line.   We strive to give you quality time with your provider. You may need to reschedule your appointment if you arrive late (15 or more minutes).  Arriving late affects you and other patients whose appointments are after yours.  Also, if you miss three or more appointments without notifying the office, you may be dismissed from the clinic at the provider's discretion.      For prescription refill requests, have your pharmacy contact our office and allow 72 hours for refills to be completed.    Today you received the following chemotherapy and/or immunotherapy agents: Trastuzumab      To help prevent nausea and vomiting after your treatment, we encourage you to take your nausea medication as directed.  BELOW ARE SYMPTOMS THAT SHOULD BE REPORTED IMMEDIATELY: *FEVER GREATER THAN 100.4 F (38 C) OR HIGHER *CHILLS OR SWEATING *NAUSEA AND VOMITING THAT IS NOT CONTROLLED WITH YOUR NAUSEA MEDICATION *UNUSUAL SHORTNESS OF BREATH *UNUSUAL BRUISING OR BLEEDING *URINARY PROBLEMS (pain or burning when urinating, or frequent urination) *BOWEL PROBLEMS (unusual diarrhea, constipation, pain near the anus) TENDERNESS IN MOUTH AND THROAT WITH OR WITHOUT PRESENCE OF ULCERS (sore throat, sores in mouth, or a toothache) UNUSUAL RASH, SWELLING OR PAIN  UNUSUAL VAGINAL DISCHARGE OR ITCHING   Items with * indicate a potential emergency and should be followed up as soon as possible or go to the Emergency Department if any problems should occur.  Please show the CHEMOTHERAPY ALERT CARD or IMMUNOTHERAPY ALERT CARD at  check-in to the Emergency Department and triage nurse.  Should you have questions after your visit or need to cancel or reschedule your appointment, please contact [facility]  Dept: [phone]  and follow the prompts.  Office hours are 8:00 a.m. to 4:30 p.m. Monday - Friday. Please note that voicemails left after 4:00 p.m. may not be returned until the following business day.  We are closed weekends and major holidays. You have access to a nurse at all times for urgent questions. Please call the main number to the clinic Dept: [phone] and follow the prompts.   For any non-urgent questions, you may also contact your provider using MyChart. We now offer e-Visits for anyone 18 and older to request care online for non-urgent symptoms. For details visit [email or web address].   Also download the MyChart app! Go to the app store, search "MyChart", open the app, select , and log in with your MyChart username and password.  

## 2022-11-28 ENCOUNTER — Ambulatory Visit: Payer: Medicare HMO

## 2022-12-11 DIAGNOSIS — M19111 Post-traumatic osteoarthritis, right shoulder: Secondary | ICD-10-CM | POA: Diagnosis not present

## 2022-12-17 DIAGNOSIS — Z961 Presence of intraocular lens: Secondary | ICD-10-CM | POA: Diagnosis not present

## 2022-12-17 DIAGNOSIS — H353131 Nonexudative age-related macular degeneration, bilateral, early dry stage: Secondary | ICD-10-CM | POA: Diagnosis not present

## 2022-12-17 DIAGNOSIS — H52203 Unspecified astigmatism, bilateral: Secondary | ICD-10-CM | POA: Diagnosis not present

## 2022-12-18 DIAGNOSIS — L570 Actinic keratosis: Secondary | ICD-10-CM | POA: Diagnosis not present

## 2022-12-18 DIAGNOSIS — D485 Neoplasm of uncertain behavior of skin: Secondary | ICD-10-CM | POA: Diagnosis not present

## 2022-12-18 DIAGNOSIS — D0462 Carcinoma in situ of skin of left upper limb, including shoulder: Secondary | ICD-10-CM | POA: Diagnosis not present

## 2022-12-19 ENCOUNTER — Inpatient Hospital Stay: Payer: Medicare HMO

## 2022-12-19 ENCOUNTER — Inpatient Hospital Stay: Payer: Medicare HMO | Attending: Gynecologic Oncology | Admitting: Hematology and Oncology

## 2022-12-19 ENCOUNTER — Other Ambulatory Visit: Payer: Self-pay

## 2022-12-19 ENCOUNTER — Encounter: Payer: Self-pay | Admitting: Hematology and Oncology

## 2022-12-19 VITALS — BP 137/59 | HR 74 | Temp 98.0°F | Resp 18 | Ht 62.0 in | Wt 137.0 lb

## 2022-12-19 VITALS — BP 132/60 | HR 68 | Resp 17

## 2022-12-19 DIAGNOSIS — I1310 Hypertensive heart and chronic kidney disease without heart failure, with stage 1 through stage 4 chronic kidney disease, or unspecified chronic kidney disease: Secondary | ICD-10-CM | POA: Insufficient documentation

## 2022-12-19 DIAGNOSIS — Z7982 Long term (current) use of aspirin: Secondary | ICD-10-CM | POA: Insufficient documentation

## 2022-12-19 DIAGNOSIS — E039 Hypothyroidism, unspecified: Secondary | ICD-10-CM | POA: Diagnosis not present

## 2022-12-19 DIAGNOSIS — Z7189 Other specified counseling: Secondary | ICD-10-CM

## 2022-12-19 DIAGNOSIS — N183 Chronic kidney disease, stage 3 unspecified: Secondary | ICD-10-CM | POA: Diagnosis not present

## 2022-12-19 DIAGNOSIS — C541 Malignant neoplasm of endometrium: Secondary | ICD-10-CM

## 2022-12-19 DIAGNOSIS — Z5112 Encounter for antineoplastic immunotherapy: Secondary | ICD-10-CM | POA: Insufficient documentation

## 2022-12-19 DIAGNOSIS — Z90710 Acquired absence of both cervix and uterus: Secondary | ICD-10-CM | POA: Insufficient documentation

## 2022-12-19 DIAGNOSIS — Z79899 Other long term (current) drug therapy: Secondary | ICD-10-CM | POA: Diagnosis not present

## 2022-12-19 DIAGNOSIS — C773 Secondary and unspecified malignant neoplasm of axilla and upper limb lymph nodes: Secondary | ICD-10-CM

## 2022-12-19 DIAGNOSIS — R7989 Other specified abnormal findings of blood chemistry: Secondary | ICD-10-CM | POA: Diagnosis not present

## 2022-12-19 DIAGNOSIS — E041 Nontoxic single thyroid nodule: Secondary | ICD-10-CM | POA: Diagnosis not present

## 2022-12-19 MED ORDER — TRASTUZUMAB-ANNS CHEMO 150 MG IV SOLR
6.0000 mg/kg | Freq: Once | INTRAVENOUS | Status: AC
  Administered 2022-12-19: 357 mg via INTRAVENOUS
  Filled 2022-12-19: qty 17

## 2022-12-19 MED ORDER — SODIUM CHLORIDE 0.9 % IV SOLN: Freq: Once | INTRAVENOUS | Status: AC

## 2022-12-19 MED ORDER — LIDOCAINE-PRILOCAINE 2.5-2.5 % EX CREA: 1.0000 | TOPICAL_CREAM | CUTANEOUS | 3 refills | Status: DC | PRN

## 2022-12-19 MED ORDER — HEPARIN SOD (PORK) LOCK FLUSH 100 UNIT/ML IV SOLN
500.0000 [IU] | Freq: Once | INTRAVENOUS | Status: AC | PRN
  Administered 2022-12-19: 500 [IU]

## 2022-12-19 MED ORDER — SODIUM CHLORIDE 0.9% FLUSH
10.0000 mL | INTRAVENOUS | Status: DC | PRN
  Administered 2022-12-19: 10 mL

## 2022-12-19 NOTE — Progress Notes (Signed)
12/23/2022 MYOSHA MELITO [id number] 1940/11/29  Referring provider: Geoffry Paradise, MD Primary GI doctor: Dr. Marina Goodell  ASSESSMENT AND PLAN:   Endometrial cancer [facility]) on infusion, following Dr. Bertis Ruddy presents with abnormal CT scan of GI tract History of gastric submucosal lesion noted in 2008 Last EUS 12/2014 Dr. Christella Hartigan without changes in size for 2 years, no surgical resection warranted- no follow up  CT AB and pelvis, chest with contrast for endometrial cancer showed nodular area along upper aspect of stomach fold thickening, 18x 14 mm, nodule areas on antrum  No GERD symptoms Possible benign gastric hyperplastic polyps but will schedule EGD to rule out malignancy at [facility] with Dr. Marina Goodell.  I discussed risks of EGD with patient today, including risk of sedation, bleeding or perforation.  Patient provides understanding and gave verbal consent to proceed.  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 Add on fiber supplement, avoid NSAIDS, information given  Fatty liver --Continue to work on risk factor modification including diet exercise and control of risk factors including blood sugars. - monitor q 6 months.   History of adenomatous polyp of colon 05/13/2018 colonoscopy with Dr. Marina Goodell for screening purposes 6 mm polyp ascending colon multiple small and large mouth diverticula, tubular adenoma, no routine surveillance based on age.  Patient Care Team: Geoffry Paradise, [name]  HISTORY OF PRESENT ILLNESS: 82 y.o. 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   01/05/2015 upper EUS by Dr. Christella Hartigan for gastric submucosal lesion noted 2008, previous evaluation 2014, at that time the gastric submucosal lesion had not changed in size in the past 2 years and had not significantly changed in 8 years since first EUS and did not warrant surgical resection.   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  05/13/2018 colonoscopy with Dr. Marina Goodell for screening purposes 6 mm polyp ascending colon multiple small and large mouth diverticula, tubular adenoma, no routine surveillance based on age.  Patient following with Dr. Emeline Darling such for endometrial cancer high-grade diagnosed 2018, gets infusion every 3 weeks, has portacath.   11/05/2022 Ct chest AB and pelvis for cervical cancer showed fatty liver, gallbladder non dilated, normal pancreas and spleen, Large bowel extensive left sided stool burden, diverticulosis, nodular area along upper aspect of stomach fold thickening, 18x 14 mm, nodule areas on antrum  Patient denies GERD, she is on pantoprazole.  She denies dysphagia, melena. Denies AB pain.  She denies nausea, vomiting.  She  denies AB bloating.  No unintentional weight loss, no night sweats. She has been off her thyroid medication for 6 weeks and has gained weight.  She denies blood thinner use.  She denies NSAID use. On tylenol nightly.  She reports ETOH use 1-2 glasses of wine Thursday night, Saturday 1 drink, and 1 drink Sunday. Will drink wine.  She denies tobacco use.  She denies drug use.    She  reports that she has never smoked. She has never used smokeless tobacco.  She reports current alcohol use of about 3.0 standard drinks of alcohol per week. She reports that she does not use drugs.  RELEVANT LABS AND IMAGING: CBC    Component Value Date/Time   WBC 6.5 11/05/2022  1215   WBC 6.2 03/12/2022 1000   RBC 3.54 (L) 11/05/2022 1215   HGB 11.5 (L) 11/05/2022 1215   HCT 31.0 (L) 11/05/2022 1215   PLT 251 11/05/2022 1215   MCV 87.6 11/05/2022 1215   MCH 32.5 11/05/2022 1215   MCHC 37.1 (H) 11/05/2022 1215   RDW 11.8 11/05/2022 1215   LYMPHSABS 1.3 11/05/2022 1215   MONOABS 0.6 11/05/2022 1215   EOSABS 0.2 11/05/2022 1215   BASOSABS 0.1 11/05/2022 1215   Recent Labs    01/08/22 0934 03/12/22 1000 04/23/22 1017 06/04/22 0854 07/18/22 0929 09/03/22 0812 11/05/22 1215  HGB 10.6* 10.9* 10.9* 10.7* 10.6* 10.7* 11.5*    CMP     Component Value Date/Time   NA 135 11/05/2022 1215   NA 139 02/13/2017 1512   K 3.6 11/05/2022 1215   K 4.2 02/13/2017 1512   CL 99 11/05/2022 1215   CO2 27 11/05/2022 1215   CO2 28 02/13/2017 1512   GLUCOSE 93 11/05/2022 1215   GLUCOSE 118 02/13/2017 1512   BUN 21 11/05/2022 1215   BUN 25.2 02/13/2017 1512   CREATININE 1.20 (H) 11/05/2022 1215   CREATININE 1.2 (H) 02/13/2017 1512   CALCIUM 9.4 11/05/2022 1215   CALCIUM 10.0 02/13/2017 1512   PROT 6.8 11/05/2022 1215   ALBUMIN 4.2 11/05/2022 1215   AST 19 11/05/2022 1215   ALT 12 11/05/2022 1215   ALKPHOS 55 11/05/2022 1215   BILITOT 0.4 11/05/2022 1215   GFRNONAA 45 (L) 11/05/2022 1215   GFRAA 47 (L) 03/28/2020 0933   GFRAA 42 (L) 12/21/2019 0850      Latest Ref Rng & Units 11/05/2022   12:15 PM 03/12/2022   10:00 AM 01/08/2022    9:34 AM  Hepatic Function  Total Protein 6.5 - 8.1 g/dL 6.8  6.8  6.7   Albumin 3.5 - 5.0 g/dL 4.2  4.4  4.1   AST 15 - 41 U/L 19  23  20    ALT 0 - 44 U/L 12  13  14    Alk Phosphatase 38 - 126 U/L 55  51  61   Total Bilirubin 0.3 - 1.2 mg/dL 0.4  0.4  0.4       Current Medications:   Current Outpatient Medications (Endocrine & Metabolic):    levothyroxine (SYNTHROID, LEVOTHROID) 100 MCG tablet, Take 100 mcg by mouth daily before breakfast.   Current Outpatient Medications (Cardiovascular):    atorvastatin (LIPITOR) 20  MG tablet, Take 20 mg by mouth every evening.    losartan-hydrochlorothiazide (HYZAAR) 100-25 MG tablet,    Metoprolol-Hydrochlorothiazide 25-12.5 MG TB24, 1 tablet daily in the afternoon.   olmesartan-hydrochlorothiazide (BENICAR HCT) 40-25 MG tablet, Take 1 tablet by mouth daily.   Current Outpatient Medications (Analgesics):    acetaminophen (TYLENOL) 500 MG tablet, Take 1,000 mg by mouth every 6 (six) hours as needed.   aspirin EC 81 MG tablet, Take 81 mg by mouth daily.   Current Outpatient Medications (Other):    citalopram (CELEXA) 20 MG tablet, Take 20 mg by mouth daily.   Multiple Vitamin (MULTIVITAMIN WITH MINERALS) TABS, Take 1 tablet by mouth daily.   pantoprazole (PROTONIX) 40 MG tablet, Take 40 mg by mouth daily.   estradiol (ESTRACE) 0.1 MG/GM vaginal cream, PLACE  1 APPLICATORFUL VAGINALLY 3 TIMES A WEEK   lidocaine-prilocaine (EMLA) cream, Apply 1 Application topically as needed. (Patient not taking: Reported on 12/23/2022)   valACYclovir (VALTREX) 1000 MG tablet, valacyclovir 1 gram tablet  Medical History:  Past Medical History:  Diagnosis Date   Aortic atherosclerosis (HCC)    Arthritis    Blood transfusion without reported diagnosis    Breast cancer (HCC)     LEFT, '82-left breast cancer-surgery only   Cataract    removed both eyes   Complication of anesthesia    nausea   Diverticulosis    Esophageal stricture    Family history of stomach cancer    GERD (gastroesophageal reflux disease)    Hemorrhoids    Hiatal hernia    High triglycerides    Hypercholesteremia    Hypertension    Hypothyroidism    Osteoarthritis    PONV (postoperative nausea and vomiting)    Uterine cancer (HCC)    Allergies: No Known Allergies   Surgical History:  She  has a past surgical history that includes Ovary surgery ([id number]); Mastectomy, radical (Left, 1982); Rotator cuff repair (Left); Total knee arthroplasty (Bilateral, 2009-2010); Foot surgery (Left, 2009-2010); Lumbar  disc surgery (01-01-13); Simple mastectomy (Left, 1982); EUS (N/A, 01/07/2013); left heart catheterization with coronary angiogram (N/A, 01/24/2014); EUS (N/A, 01/05/2015); Robotic assisted total hysterectomy with bilateral salpingo oophorectomy (N/A, 10/22/2016); Sentinel node biopsy (N/A, 10/22/2016); Upper gastrointestinal endoscopy; Colonoscopy; and IR IMAGING GUIDED PORT INSERTION (10/19/2018). Family History:  Her family history includes Colon polyps in her brother and sister; Heart attack in her brother and father; Heart disease in her mother; Other in her mother; Stomach cancer in her maternal aunt.  REVIEW OF SYSTEMS  : All other systems reviewed and negative except where noted in the History of Present Illness.  PHYSICAL EXAM: BP (!) 154/78   Pulse 71   Ht 5\' 2"  (1.575 m)   Wt 136 lb 6 oz (61.9 kg)   BMI 24.94 kg/m  General Appearance: Well nourished, in no apparent distress. Head:   Normocephalic and atraumatic. Eyes:  sclerae anicteric,conjunctive pink  Respiratory: Respiratory effort normal, BS equal bilaterally without rales, rhonchi, wheezing. Cardio: RRR with no MRGs. Peripheral pulses intact.  Abdomen: Soft,  Obese ,active bowel sounds. No tenderness . Without guarding and Without rebound. No masses. Rectal: Not evaluated Musculoskeletal: Full ROM, Normal gait. Without edema. Skin:  Dry and intact without significant lesions or rashes Neuro: Alert and  oriented x4;  No focal deficits. Psych:  Cooperative. Normal mood and affect.    Doree Albee, PA-C 2:40 PM

## 2022-12-19 NOTE — Progress Notes (Signed)
[facility] OFFICE PROGRESS NOTE  Patient Care Team: Geoffry Paradise, [name]  ASSESSMENT & PLAN:  Endometrial cancer [facility]) Her last imaging showed no evidence of cancer recurrence Appointment to see gastroenterologist to address changes near her stomach is pending Echocardiogram was normal She will continue trastuzumab indefinitely   CKD (chronic kidney disease), stage III (HCC) Renal function is stable. Monitor closely every other visit  Orders Placed This Encounter  Procedures   CBC with Differential/Platelet    Standing Status:   Standing    Number of Occurrences:   22    Standing Expiration Date:   12/19/2023   Comprehensive metabolic panel    Standing Status:   Standing    Number of Occurrences:   33    Standing Expiration Date:   12/19/2023    All questions were answered. The patient knows to call the clinic with any problems, questions or concerns. The total time spent in the appointment was 25 minutes encounter with patients including review of chart and various tests results, discussions about plan of care and coordination of care plan   Artis Delay, MD 12/19/2022 10:31 AM  INTERVAL HISTORY: Please see below for problem oriented charting. she returns for treatment follow-up on maintenance trastuzumab She has appointment pending to see gastroenterologist She have no new side effects of treatment  REVIEW OF SYSTEMS:   Constitutional: Denies fevers, chills or abnormal weight loss Eyes: Denies blurriness of vision Ears, nose, mouth, throat, and face: Denies mucositis or sore throat Respiratory: Denies cough, dyspnea or wheezes Cardiovascular: Denies palpitation, chest discomfort or lower extremity swelling Gastrointestinal:  Denies nausea, heartburn or change in bowel habits Skin: Denies abnormal skin rashes Lymphatics: Denies new lymphadenopathy or easy bruising Neurological:Denies numbness, tingling or new  weaknesses Behavioral/Psych: Mood is stable, no new changes  All other systems were reviewed with the patient and are negative.  I have reviewed the past medical history, past surgical history, social history and family history with the patient and they are unchanged from previous note.  ALLERGIES:  has No Known Allergies.  MEDICATIONS:  Current Outpatient Medications  Medication Sig Dispense Refill   acetaminophen (TYLENOL) 500 MG tablet Take 1,000 mg by mouth every 6 (six) hours as needed.     aspirin EC 81 MG tablet Take 81 mg by mouth daily.     atorvastatin (LIPITOR) 20 MG tablet Take 20 mg by mouth every evening.      Calcium Carbonate-Vitamin D (CALCIUM-VITAMIN D) 500-200 MG-UNIT per tablet Take 1 tablet by mouth daily.     estradiol (ESTRACE) 0.1 MG/GM vaginal cream PLACE 1 APPLICATORFUL VAGINALLY 3 TIMES A WEEK 42.5 g 12   levothyroxine (SYNTHROID, LEVOTHROID) 100 MCG tablet Take 100 mcg by mouth daily before breakfast.      lidocaine-prilocaine (EMLA) cream Apply 1 Application topically as needed. 30 g 3   losartan-hydrochlorothiazide (HYZAAR) 100-25 MG tablet      metoprolol succinate (TOPROL-XL) 25 MG 24 hr tablet Take 25 mg by mouth daily.     Multiple Vitamin (MULTIVITAMIN WITH MINERALS) TABS Take 1 tablet by mouth daily.     valACYclovir (VALTREX) 1000 MG tablet valacyclovir 1 gram tablet     No current facility-administered medications for this visit.   Facility-Administered Medications Ordered in Other Visits  Medication Dose Route Frequency Provider Last Rate Last Admin   heparin lock flush 100 unit/mL  500 Units Intracatheter Once PRN Artis Delay, MD  sodium chloride flush (NS) 0.9 % injection 10 mL  10 mL Intracatheter PRN Artis Delay, MD       trastuzumab-anns (KANJINTI) 357 mg in sodium chloride 0.9 % 250 mL chemo infusion  6 mg/kg (Treatment Plan Recorded) Intravenous Once Artis Delay, MD        SUMMARY OF ONCOLOGIC HISTORY: Oncology History Overview Note   Vicki Cervantes  has a remote history of left  breast cancer at age 32 but received BRCA testing approximately 5 years ago which was negative. Her cancer was treated with surgery but no radiation or chemotherapy.   Serous endometrial cancer, MSI stable ER positive, PR neg, Her2/neu 3+      Endometrial cancer (HCC)  08/29/2016 Pathology Results   Endometrium, biopsy - HIGH GRADE ENDOMETRIAL CARCINOMA, SEE COMMENT. Microscopic Comment The sections show multiple fragments of adenocarcinoma displaying glandular and papillary patterns associated with high grade cytomorphology characterized by nuclear pleomorphism, prominent nucleoli and brisk mitosis. Immunohistochemical stains show that the tumor cells are positive for vimentin, p16, p53 with increased Ki-67 expression. Estrogen and progesterone receptor stains show patchy weak positivity. No significant positivity is seen with CEA. The findings are consistent with high grade endometrial carcinoma and the overall morphology and phenotypic features favor serous carcinoma.   08/29/2016 Initial Diagnosis   She presented with postmenopausal bleeding   10/03/2016 Imaging   CT C/A/P 09/2016 IMPRESSION: 1. Thickening of the endometrial canal up to 19 mm in fundus, presumably corresponding to the patient's reported endometrial carcinoma. 2. Multiple tiny pulmonary nodules scattered throughout the lungs bilaterally measuring 4 mm or less in size. Nodules of this size are typically considered statistically likely benign. In the setting of known primary malignancy, metastatic disease to the lungs is not excluded, but is not strongly favored on today's examination. Attention on followup studies is recommended to ensure the stability or resolution of these nodules. 3. Subcentimeter low-attenuation lesion in the central aspect of segment 8 of the liver is too small to characterize. This is statistically likely a tiny cyst, but warrants attention on follow-up  studies to exclude the possibility of a solitary hepatic metastasis. 4. 1.5 x 1.5 x 1.7 cm well-circumscribed lesion in the proximal stomach. This is of uncertain etiology and significance, and could represent a benign gastric polyp, however, further evaluation with nonemergent endoscopy is suggested in the near future for further evaluation. 5. **An incidental finding of potential clinical significance has been found. 1.1 x 1.6 cm thyroid nodule in the inferior aspect of the right lobe of the thyroid gland. Follow-up evaluation with nonemergent thyroid ultrasound is recommended in the near future to better evaluate this finding. This recommendation follows ACR consensuss guidelines: Managing Incidental Thyroid Nodules Detected on Imaging: White Paper of the ACR Incidental Thyroid Findings Committee. J Am Coll Radiol 2015;12(2):143-150.** 6. Aortic atherosclerosis, in addition to left anterior descending coronary artery disease   10/22/2016 Surgery   Robotic assisted total hysterectomy, BSO and bilateral pelvic lymphadenectomy  Final pathology revealed a 3cm polyp containing serous carcinoma but with no myometrial invasion, no LVSI and negative nodes.  Stage IA Uterine serous cancer   10/22/2016 Pathology Results   1. Lymph nodes, regional resection, right pelvic - SIX BENIGN LYMPH NODES (0/6). 2. Lymph nodes, regional resection, left pelvic - SEVEN BENIGN LYMPH NODES (0/7). 3. Uterus +/- tubes/ovaries, neoplastic, with right ovary and fallopian tube ENDOMYOMETRIUM - SEROUS CARCINOMA ARISING WITHIN AN ENDOMETRIAL POLYP - NO MYOMETRIAL INVASION IDENTIFIED - ADENOMYOSIS - LEIOMYOMA (1 CM) -  SEE ONCOLOGY TABLE AND COMMENT CERVIX - CARCINOMA FOCALLY INVOLVES ENDOCERVICAL GLANDS - NABOTHIAN CYSTS RIGHT ADNEXA - BENIGN OVARY AND FALLOPIAN TUBE - NO CARCINOMA IDENTIFIED 4. Cul-de-sac biopsy - MESOTHELIAL HYPERPLASIA Microscopic Comment 3. ONCOLOGY TABLE-UTERUS, CARCINOMA OR  CARCINOSARCOMA Specimen: Uterus, right fallopian tube and ovary Procedure: Total hysterectomy and right salpingo-oophorectomy Lymph node sampling performed: Bilateral pelvic regional resection Specimen integrity: Intact Maximum tumor size: 3 cm (polyp) Histologic type: Serous carcinoma Grade: High grade Myometrial invasion: Not identified Cervical stromal involvement: No, focal endocervical gland involvement Extent of involvement of other organs: Not identified Lymph - vascular invasion: Not identified Peritoneal washings: N/A Lymph nodes: Examined: 0 Sentinel 13 Non-sentinel 13 Total Lymph nodes with metastasis: 0 Isolated tumor cells (< 0.2 mm): 0 Micrometastasis: (> 0.2 mm and < 2.0 mm): 0 Macrometastasis: (> 2.0 mm): 0 Extracapsular extension: N/A Pelvic lymph nodes: 0 involved of 13 lymph nodes. Para-aortic lymph nodes: No para-aortic nodes submitted TNM code: pT1a, pNX FIGO Stage (based on pathologic findings, needs clinical correlation): IA Comment: Immunohistochemistry for cytokeratin AE1/AE3 is performed on all of the lymph nodes (parts 1 & 2) and no metastatic carcinoma is identified.   07/04/2017 Genetic Testing   The patient had genetic testing due to a personal history of breast and uterine cancer, and a family history of stomach cancer.  The Multi-Cancer Panel was ordered. The Multi-Cancer Panel offered by [facility] includes sequencing and/or deletion duplication testing of the following 83 genes: ALK, APC, ATM, AXIN2,BAP1,  BARD1, BLM, BMPR1A, BRCA1, BRCA2, BRIP1, CASR, CDC73, CDH1, CDK4, CDKN1B, CDKN1C, CDKN2A (p14ARF), CDKN2A (p16INK4a), CEBPA, CHEK2, CTNNA1, DICER1, DIS3L2, EGFR (c.2369C>T, p.Thr790Met variant only), EPCAM (Deletion/duplication testing only), FH, FLCN, GATA2, GPC3, GREM1 (Promoter region deletion/duplication testing only), HOXB13 (c.251G>A, p.Gly84Glu), HRAS, KIT, MAX, MEN1, MET, MITF (c.952G>A, p.Glu318Lys variant only), MLH1, MSH2, MSH3, MSH6, MUTYH,  NBN, NF1, NF2, NTHL1, PALB2, PDGFRA, PHOX2B, PMS2, POLD1, POLE, POT1, PRKAR1A, PTCH1, PTEN, RAD50, RAD51C, RAD51D, RB1, RECQL4, RET, RUNX1, SDHAF2, SDHA (sequence changes only), SDHB, SDHC, SDHD, SMAD4, SMARCA4, SMARCB1, SMARCE1, STK11, SUFU, TERC, TERT, TMEM127, TP53, TSC1, TSC2, VHL, WRN and WT1.   Results: No pathogenic variants were identified.  A variant of uncertain significance in the gene APC was identified.  c.791A>G (p.Gln264Arg).  The date of this test report is 07/04/2017.     Genetic Testing   Patient has genetic testing done for MSI. 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    12/03/2017 Imaging   CT Chest/Abd/Pelvis to follow pulmonary nodule and gastric mass IMPRESSION: 1. Stable CT of the chest. Small pulmonary nodules are unchanged when compared with previous exam. 2. No new findings identified. 3. Subcentimeter low-attenuation lesions within the liver are remain too small to characterize but are stable from prior exam. 4. Persistent indeterminate low-attenuation structure within the proximal stomach is unchanged measuring 1.4 cm. Correlation with direct visualization is advised   06/30/2018 Imaging   CT CHEST Lungs/Pleura: Stable scattered sub-cm pulmonary nodules are again seen bilaterally and are stable compared to previous studies. No new or enlarging pulmonary nodules or masses identified. No evidence of pulmonary infiltrate or pleural effusion.   08/19/2018 Echocardiogram   [place] is done in [place]: EF 60-65%. Mild impaired relaxation. (report scanned)   09/17/2018 Relapse/Recurrence   Presented with c/o vagina discharge.  Lesion noted at the left vaginal apex 6mm lesion removed Path c/w high grade serous cancer   09/17/2018 Pathology Results   Vagina, biopsy, left apex - HIGH GRADE SEROUS CARCINOMA.  09/28/2018 PET scan   1. Two hypermetabolic axial lymph nodes. Unusual site for metastatic endometrial carcinoma however the activity is  more intense than typically seen in reactive adenopathy. Suggest ultrasound-guided percutaneous biopsy of the larger RIGHT axial lymph node 2. No evidence of local recurrence at the vaginal cuff.  3. No metastatic adenopathy in the abdomen or pelvis. 4. Stable small pulmonary nodules   10/09/2018 Pathology Results   Lymph node, needle/core biopsy, right axilla - METASTATIC CARCINOMA, SEE COMMENT. Microscopic Comment The carcinoma appears high grade. Immunohistochemistry is positive for cytokeratin 7, PAX-8, and ER. Cytokeratin 20, CDX-2, PR, and GATA-3 are negative. The findings along with the history are consistent with a gynecologic primary.    10/09/2018 Procedure   Ultrasound-guided core biopsies of a right axillary lymph node.   10/14/2018 Cancer Staging   Staging form: Corpus Uteri - Carcinoma and Carcinosarcoma, AJCC 8th Edition - Clinical: Stage IVB (cT1, cN0, pM1) - Signed by Artis Delay, MD on 10/14/2018   10/19/2018 Imaging   Placement of single lumen port a cath via right internal jugular vein. The catheter tip lies at the cavo-atrial junction. A power injectable port a cath was placed and is ready for immediate use.    10/21/2018 -  Chemotherapy   The patient had carboplatin and taxol for treatment   11/11/2018 - 03/12/2022 Chemotherapy   Patient is on Treatment Plan : BREAST Trastuzumab q21d     12/21/2018 PET scan   1. Interval resolution of hypermetabolic right axillary lymph nodes. No metabolic findings highly suspicious for recurrent metastatic disease. 2. New mild hypermetabolism within a borderline prominent portacaval lymph node, nonspecific. While a reactive node is favored, a lymph node metastasis cannot be entirely excluded. Suggest attention to this lymph node on follow-up PET-CT in 3-6 months. 3. Nonspecific new hypermetabolism at the ileocecal valve, more likely physiologic given absence of CT correlate. 4. Scattered subcentimeter pulmonary nodules are all stable and  below PET resolution, more likely benign, continued CT surveillance advised. 5. Chronic findings include: Aortic Atherosclerosis (ICD10-I70.0). Marked diffuse colonic diverticulosis. Coronary atherosclerosis.     12/28/2018 Echocardiogram   1. The left ventricle has normal systolic function with an ejection fraction of 60-65%. The cavity size was normal. Left ventricular diastolic Doppler parameters are consistent with impaired relaxation.  2. GLS recorded as -10.7 but LV appears hyperdynamic and tracking of endocardium appears poor.  3. The right ventricle has normal systolic function. The cavity was normal. There is no increase in right ventricular wall thickness.  4. Mild thickening of the mitral valve leaflet.  5. The aortic valve was not well visualized. Mild thickening of the aortic valve. Aortic valve regurgitation is trivial by color flow Doppler.   03/23/2019 Imaging   PET 1. No findings of hypermetabolic residual/recurrent or metastatic disease. 2. Similar low-level hypermetabolism within normal sized portocaval and right inguinal nodes, favored to be reactive. 3. Ongoing stability of small bilateral pulmonary nodules, favored to be benign. Below PET resolution. 4. Coronary artery atherosclerosis. Aortic Atherosclerosis   03/26/2019 Echocardiogram   1. The left ventricle has normal systolic function, with an ejection fraction of 55-60%. The cavity size was normal. Left ventricular diastolic Doppler parameters are consistent with impaired relaxation.  2. The right ventricle has normal systolic function. The cavity was normal.  3. The mitral valve is abnormal. Mild thickening of the mitral valve leaflet. There is mild mitral annular calcification present.  4. The tricuspid valve is grossly normal.  5. The aortic valve  is tricuspid. Mild calcification of the aortic valve. No stenosis of the aortic valve.  6. The aorta is normal unless otherwise noted.  7. Normal LV systolic function;  grade 1 diastolic dysfunction; GLS-15.2%.   06/30/2019 Imaging   Chest Impression:   1. No evidence thoracic metastasis. 2. Stable small benign-appearing pulmonary nodules   Abdomen / Pelvis Impression:   1. No evidence of metastatic disease in the abdomen pelvis. 2.  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 3.  Aortic Atherosclerosis (ICD10-I70.0).   06/30/2019 Echocardiogram    1. Left ventricular ejection fraction, by visual estimation, is 60 to 65%. The left ventricle has normal function. There is no left ventricular hypertrophy.  2. Left ventricular diastolic parameters are consistent with Grade I diastolic dysfunction (impaired relaxation).  3. The left ventricle has no regional wall motion abnormalities.  4. Global right ventricle has normal systolic function.The right ventricular size is normal. No increase in right ventricular wall thickness.  5. Left atrial size was mild-moderately dilated.  6. Right atrial size was normal.  7. The mitral valve is normal in structure. Trivial mitral valve regurgitation.  8. The tricuspid valve is normal in structure. Tricuspid valve regurgitation is trivial.  9. The aortic valve is normal in structure. Aortic valve regurgitation is trivial. No evidence of aortic valve sclerosis or stenosis. 10. The pulmonic valve was grossly normal. Pulmonic valve regurgitation is not visualized. 11. Mildly elevated pulmonary artery systolic pressure. 12. The inferior vena cava is normal in size with greater than 50% respiratory variability, suggesting right atrial pressure of 3 mmHg. 13. The average left ventricular global longitudinal strain is -12.5 %. GLS underestimated due to poor endocardial tracking.     09/27/2019 Imaging   1. Multiple small, nonspecific pulmonary nodules are again noted. The previously noted lung nodules are unchanged in size from previous exam. There is a new lung nodule within the medial right lower lobe which is  nonspecific measure 4 mm. Attention at follow-up imaging is recommended. 2. No evidence of metastatic disease within the abdomen or pelvis. 3. Coronary artery calcifications.  The 4.  Aortic Atherosclerosis (ICD10-I70.0).   09/30/2019 Echocardiogram    1. Left ventricular ejection fraction, by estimation, is 60 to 65%. The left ventricle has normal function. The left ventricle has no regional wall motion abnormalities. Left ventricular diastolic parameters are consistent with Grade I diastolic dysfunction (impaired relaxation).  2. Right ventricular systolic function is normal. The right ventricular size is normal. There is normal pulmonary artery systolic pressure. The estimated right ventricular systolic pressure is 24.2 mmHg.  3. The mitral valve is normal in structure. No evidence of mitral valve regurgitation. No evidence of mitral stenosis.  4. The aortic valve is normal in structure. Aortic valve regurgitation is trivial. No aortic stenosis is present.  5. The inferior vena cava is normal in size with greater than 50% respiratory variability, suggesting right atrial pressure of 3 mmHg.     01/07/2020 Echocardiogram    1. Normal LV function; grade 1 diastolic dysfunction; mild AI; GLS -21%.  2. Left ventricular ejection fraction, by estimation, is 55 to 60%. The left ventricle has normal function. The left ventricle has no regional wall motion abnormalities. Left ventricular diastolic parameters are consistent with Grade I diastolic dysfunction (impaired relaxation).  3. Right ventricular systolic function is normal. The right ventricular size is normal.  4. The mitral valve is normal in structure. Trivial mitral valve regurgitation. No evidence of mitral stenosis.  5. The  aortic valve is tricuspid. Aortic valve regurgitation is mild. Mild aortic valve sclerosis is present, with no evidence of aortic valve stenosis.  6. The inferior vena cava is normal in size with greater than 50% respiratory  variability, suggesting right atrial pressure of 3 mmHg.     03/27/2020 Imaging   1. Status post hysterectomy. No evidence of recurrent or metastatic disease in the abdomen or pelvis. 2. Multiple small pulmonary nodules in the lung bases are unchanged to the extent that they are included on current examination and remain most likely sequelae of prior infection or inflammation. Attention on follow-up. 3. Aortic Atherosclerosis (ICD10-I70.0).   04/17/2020 Echocardiogram   1. Normal LVEF, normal and unchanged GLS: -20.2%.  2. Left ventricular ejection fraction, by estimation, is 60 to 65%. The left ventricle has normal function. The left ventricle has no regional wall motion abnormalities. There is mild concentric left ventricular hypertrophy. Left ventricular diastolic parameters are consistent with Grade I diastolic dysfunction (impaired relaxation). The average left ventricular global longitudinal strain is -20.2 %. The global longitudinal strain is normal.  3. Right ventricular systolic function is normal. The right ventricular size is normal.  4. The mitral valve is normal in structure. Mild mitral valve regurgitation. No evidence of mitral stenosis.  5. The aortic valve is normal in structure. There is mild calcification of the aortic valve. There is mild thickening of the aortic valve. Aortic valve regurgitation is mild. No aortic stenosis is present.  6. The inferior vena cava is normal in size with greater than 50% respiratory variability, suggesting right atrial pressure of 3 mmHg.   08/02/2020 Echocardiogram   1. Left ventricular ejection fraction, by estimation, is 55 to 60%. The left ventricle has normal function. The left ventricle has no regional wall motion abnormalities. Left ventricular diastolic parameters are consistent with Grade I diastolic dysfunction (impaired relaxation). The average left ventricular global longitudinal strain is -27.0 %. The global longitudinal strain is  normal.  2. Right ventricular systolic function is normal. The right ventricular size is normal. Tricuspid regurgitation signal is inadequate for assessing PA pressure.  3. The mitral valve is grossly normal. Trivial mitral valve regurgitation. No evidence of mitral stenosis.  4. The aortic valve is tricuspid. Aortic valve regurgitation is mild. No aortic stenosis is present.  5. The inferior vena cava is normal in size with greater than 50% respiratory variability, suggesting right atrial pressure of 3 mmHg.   09/19/2020 Imaging   1. Status post hysterectomy. No evidence of recurrent or new metastatic disease within the chest, abdomen, or pelvis. 2. Scattered bilateral tiny pulmonary nodules, unchanged from prior studies and most likely sequela of prior infection or inflammation, recommend attention on follow-up imaging. No new suspicious pulmonary nodules or masses. 3. Subcutaneous edema and small hematoma overlying the posterior gluteal musculature, recommend correlation with recent history of trauma. 4. Mild low-density wall thickening of the gastric antrum, which may represent gastritis. 5. Hepatic steatosis. 6. Extensive sigmoid colonic diverticulosis without findings of acute diverticulitis. 7. Aortic atherosclerosis.  Aortic Atherosclerosis (ICD10-I70.0).   11/20/2020 Echocardiogram    1. Compared to echo from Jan 2022, Global longitudinal strain is less negative. (Previously -27%). Left ventricular ejection fraction, by estimation, is 60 to 65%. The left ventricle has normal function. The left ventricle has no regional wall motion abnormalities. There is mild left ventricular hypertrophy. Left ventricular diastolic parameters are indeterminate.  2. Right ventricular systolic function is normal. The right ventricular size is normal.  3. The mitral  valve is normal in structure. Trivial mitral valve regurgitation.  4. Aortic valve regurgitation is mild. Mild aortic valve sclerosis is present,  with no evidence of aortic valve stenosis.     03/12/2021 Echocardiogram   1. Left ventricular ejection fraction, by estimation, is 60 to 65%. Left ventricular ejection fraction by 2D MOD biplane is 63.6 %. The left ventricle has normal function. The left ventricle has no regional wall motion abnormalities. There is mild left ventricular hypertrophy. Left ventricular diastolic parameters are consistent with Grade I diastolic dysfunction (impaired relaxation). The average left ventricular global longitudinal strain is -20.0 %. The global longitudinal strain is normal.  2. Right ventricular systolic function is normal. The right ventricular size is normal. There is normal pulmonary artery systolic pressure. The estimated right ventricular systolic pressure is 20.0 mmHg.  3. The mitral valve is normal in structure. No evidence of mitral valve regurgitation. No evidence of mitral stenosis.  4. The aortic valve is tricuspid. Aortic valve regurgitation is trivial. No aortic stenosis is present. Aortic regurgitation PHT measures 403 msec.  5. The inferior vena cava is normal in size with greater than 50% respiratory variability, suggesting right atrial pressure of 3 mmHg.   04/02/2021 Imaging   Stable small pulmonary nodules scattered throughout the chest, unchanged, without new or suspicious pulmonary nodule.   Three-vessel coronary artery disease.   Aortic Atherosclerosis (ICD10-I70.0).   06/06/2021 Imaging    1. Left ventricular ejection fraction, by estimation, is 60 to 65%. The left ventricle has normal function. The left ventricle has no regional wall motion abnormalities. Left ventricular diastolic parameters are consistent with Grade I diastolic dysfunction (impaired relaxation).  2. Right ventricular systolic function is normal. The right ventricular size is normal. There is normal pulmonary artery systolic pressure. The estimated right ventricular systolic pressure is 28.6 mmHg.  3. Left atrial  size was mildly dilated.  4. The mitral valve is normal in structure. Trivial mitral valve regurgitation.  5. The aortic valve is tricuspid. Aortic valve regurgitation is mild. No aortic stenosis is present.  6. The inferior vena cava is normal in size with greater than 50% respiratory variability, suggesting right atrial pressure of 3 mmHg.  7. GLS attempted but not reported due to suboptimal tracking.     09/25/2021 Echocardiogram    1. Left ventricular ejection fraction, by estimation, is 60 to 65%. Left ventricular ejection fraction by 3D volume is 56 %. The left ventricle has normal function. The left ventricle has no regional wall motion abnormalities. Left ventricular diastolic parameters were normal.  2. Right ventricular systolic function is normal. The right ventricular size is normal. There is normal pulmonary artery systolic pressure.  3. The mitral valve is normal in structure. Trivial mitral valve regurgitation. No evidence of mitral stenosis.  4. The aortic valve is tricuspid. Aortic valve regurgitation is mild. No aortic stenosis is present.  5. The inferior vena cava is normal in size with greater than 50% respiratory variability, suggesting right atrial pressure of 3 mmHg.   10/15/2021 Imaging   1. Stable examination post prior hysterectomy without evidence of local recurrence or metastatic disease within the chest, abdomen, or pelvis. 2. Unchanged small bilateral pulmonary nodules, likely benign. 3. Left-sided colonic diverticulosis without findings of acute diverticulitis. 4. Aortic Atherosclerosis (ICD10-I70.0).       12/31/2021 Echocardiogram    1. Left ventricular ejection fraction, by estimation, is 65 to 70%. The left ventricle has normal function. The left ventricle has no regional wall motion  abnormalities. Left ventricular diastolic parameters are consistent with Grade I diastolic dysfunction (impaired relaxation).  2. Right ventricular systolic function is normal. The  right ventricular size is normal. There is normal pulmonary artery systolic pressure. The estimated right ventricular systolic pressure is 27.4 mmHg.  3. The mitral valve is grossly normal. Trivial mitral valve regurgitation. No evidence of mitral stenosis.  4. The aortic valve is tricuspid. Aortic valve regurgitation is mild. No aortic stenosis is present.  5. The inferior vena cava is normal in size with greater than 50% respiratory variability, suggesting right atrial pressure of 3 mmHg.     04/02/2022 -  Chemotherapy   Patient is on Treatment Plan : BREAST MAINTENANCE Trastuzumab IV (6) or SQ (600) D1 q21d X 11 Cycles     04/04/2022 Echocardiogram    1. Left ventricular ejection fraction, by estimation, is 65 to 70%. The left ventricle has normal function. The left ventricle has no regional wall motion abnormalities. Left ventricular diastolic parameters are consistent with Grade I diastolic dysfunction (impaired relaxation).  2. Right ventricular systolic function is normal. The right ventricular size is normal.  3. Left atrial size was mildly dilated.  4. The mitral valve is normal in structure. Trivial mitral valve regurgitation. No evidence of mitral stenosis.  5. The aortic valve is tricuspid. There is mild calcification of the aortic valve. Aortic valve regurgitation is mild. No aortic stenosis is present. Aortic regurgitation PHT measures 613 msec. Aortic valve area, by VTI measures 1.70 cm. Aortic valve mean gradient measures 3.0 mmHg. Aortic valve Vmax measures 1.15 m/s.  6. The inferior vena cava is normal in size with greater than 50% respiratory variability, suggesting right atrial pressure of 3 mmHg.   07/02/2022 Echocardiogram   1. Left ventricular ejection fraction, by estimation, is 60 to 65%. Left ventricular ejection fraction by 3D volume is 58 %. The left ventricle has normal function. The left ventricle has no regional wall motion abnormalities. Left ventricular diastolic  parameters are consistent with Grade I diastolic dysfunction (impaired relaxation). The average left ventricular global longitudinal strain is -20.7 %. The global longitudinal strain is normal.  2. Right ventricular systolic function is normal. The right ventricular size is normal. Tricuspid regurgitation signal is inadequate for assessing PA pressure.  3. Left atrial size was mildly dilated.  4. The mitral valve is normal in structure. Trivial mitral valve regurgitation. No evidence of mitral stenosis.  5. The aortic valve is grossly normal. Aortic valve regurgitation is mild. No aortic stenosis is present.  6. The inferior vena cava is normal in size with greater than 50% respiratory variability, suggesting right atrial pressure of 3 mmHg.   11/05/2022 Echocardiogram       1. Left ventricular ejection fraction, by estimation, is 55 to 60%. Left ventricular ejection fraction by 3D volume is 59 %. The left ventricle has normal function. The left ventricle has no regional wall motion abnormalities. Left ventricular diastolic  parameters are consistent with Grade I diastolic dysfunction (impaired relaxation). The average left ventricular global longitudinal strain is -16.8 %. The global longitudinal strain is normal.  2. Right ventricular systolic function is normal. The right ventricular size is normal. There is normal pulmonary artery systolic pressure.  3. The mitral valve is normal in structure. No evidence of mitral valve regurgitation. No evidence of mitral stenosis.  4. The aortic valve is normal in structure. Aortic valve regurgitation is mild. No aortic stenosis is present.  5. The inferior vena cava is normal  in size with greater than 50% respiratory variability, suggesting right atrial pressure of 3 mmHg.     11/07/2022 Imaging   CT CHEST ABDOMEN PELVIS W CONTRAST  Result Date: 11/07/2022 CLINICAL DATA:  Cervical cancer. Assess treatment response. On chemotherapy * Tracking Code: BO *  EXAM: CT CHEST, ABDOMEN, AND PELVIS WITH CONTRAST TECHNIQUE: Multidetector CT imaging of the chest, abdomen and pelvis was performed following the standard protocol during bolus administration of intravenous contrast. RADIATION DOSE REDUCTION: This exam was performed according to the departmental dose-optimization program which includes automated exposure control, adjustment of the mA and/or kV according to patient size and/or use of iterative reconstruction technique. CONTRAST:  OMNIPAQUE IOHEXOL 300 MG/ML  SOLN COMPARISON:  CT 10/15/2021 and older FINDINGS: CT CHEST FINDINGS Cardiovascular: Right upper chest port. Heart is nonenlarged. No pericardial effusion. The thoracic aorta has a normal course and caliber with scattered vascular calcifications. There is a bovine type aortic arch, a normal variant. Mediastinum/Nodes: Heterogeneous small thyroid gland with a nodular area in the right is unchanged from previous. As stated previously, no specific imaging follow-up. No specific abnormal lymph node enlargement seen in the axillary regions, hilum or mediastinum. Surgical clips in the left axillary region. Breast implants are noted. Normal caliber thoracic esophagus. Lungs/Pleura: There is some linear opacity lung bases likely scar or atelectasis. No pleural effusion. No consolidation or pneumothorax. There are a few scattered tiny lung nodules identified. Example 3 mm superior segment left lower lobe on series 6, image 49 which is stable. Lingular nodule measuring 4 mm on the prior, today is stable on series 6, image 73. 3 mm nodule along the margin of the minor fissure on the right is also stable. There is a larger ill-defined areas seen right lung on the prior examination series 4, image 63 which has resolved. This may have been infectious or inflammatory. There is a small area of bronchiectasis in this location today on series 6, image 58. No new dominant lung nodule. Musculoskeletal: Diffuse moderate  degenerative changes of the spine. CT ABDOMEN PELVIS FINDINGS Hepatobiliary: Fatty liver infiltration. There are some tiny low-attenuation lesion seen in the liver which are too small to completely characterize but unchanged from previous. Simple attention on follow-up for the patient's neoplasm. Example series 2, image 44 anterior to branches of the right hepatic vein. Gallbladder is nondilated. Patent portal vein. Pancreas: Unremarkable. No pancreatic ductal dilatation or surrounding inflammatory changes. Spleen: Normal in size without focal abnormality. Adrenals/Urinary Tract: The adrenal glands are preserved. No enhancing renal mass. There are some tiny low-attenuation lesion seen along each kidney which are too small to completely characterize likely small cysts. No specific imaging follow-up. Bosniak 2 lesions. Contracted urinary bladder. Stomach/Bowel: Large bowel has extensive left-sided colonic stool. Extensive colonic diverticulosis as well. The large bowel is nondilated. Small bowel is nondilated. There is a nodular area along the upper aspect of the stomach fold thickening. On series 2, image 45 this measures 18 by 14 mm. This is not well seen previously but on the prior study the stomach is more collapsed. There may have been something in retrospect in this location. There also is some nodular areas along the antrum. Recommend further evaluation to exclude underlying lesion. Vascular/Lymphatic: Normal caliber aorta and IVC with scattered vascular calcifications. No specific abnormal lymph node enlargement identified in the abdomen and pelvis. Reproductive: Status post hysterectomy. No adnexal masses. Other: No free air or free fluid. Musculoskeletal: Moderate degenerative changes of the spine and pelvis. Trace  anterolisthesis at L4-5 and L5-S1. 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 IMPRESSION: Stable tiny multiple pulmonary nodules. 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  No developing lymph node enlargement or fluid collection. Colonic diverticulosis of the sigmoid colon with moderate stool. There are nodular masslike areas seen in the stomach including towards the fundus and small area towards the antrum with fold thickening. Hyperplastic polyps are possible. Recommend further workup of the stomach when appropriate to exclude an aggressive process. Electronically Signed   By: Karen Kays M.D.   On: 11/07/2022 13:39   ECHOCARDIOGRAM COMPLETE  Result Date: 11/05/2022    ECHOCARDIOGRAM REPORT   Patient Name:   Vicki Cervantes Date of Exam: 11/05/2022 Medical Rec #:  [id number]      Height:       62.0 in Accession #:    [id number]     Weight:       134.8 lb Date of Birth:  [birth year]      BSA:          1.617 m Patient Age:    81 years       BP:           144/72 mmHg Patient Gender: F              HR:           69 bpm. Exam Location:  Outpatient Procedure: 2D Echo, 3D Echo, Cardiac Doppler, Color Doppler and Strain Analysis Indications:    chemo  History:        Patient has prior history of Echocardiogram examinations. Risk                 Factors:Hypertension.  Sonographer:    Mike Gip Referring Phys: [id number] Khaleel Beckom IMPRESSIONS  1. Left ventricular ejection fraction, by estimation, is 55 to 60%. Left ventricular ejection fraction by 3D volume is 59 %. The left ventricle has normal function. The left ventricle has no regional wall motion abnormalities. Left ventricular diastolic  parameters are consistent with Grade I diastolic dysfunction (impaired relaxation). The average left ventricular global longitudinal strain is -16.8 %. The global longitudinal strain is normal.  2. Right ventricular systolic function is normal. The right ventricular size is normal. There is normal pulmonary artery systolic pressure.  3. The mitral valve is normal in structure. No evidence of mitral valve regurgitation. No evidence of mitral stenosis.  4. The aortic valve is normal in structure.  Aortic valve regurgitation is mild. No aortic stenosis is present.  5. The inferior vena cava is normal in size with greater than 50% respiratory variability, suggesting right atrial pressure of 3 mmHg. Comparison(s): Prior 07/01/2022: EF 60%, GLS -20.7% Prior 03/2022: GLS-15.3. FINDINGS  Left Ventricle: Left ventricular ejection fraction, by estimation, is 55 to 60%. Left ventricular ejection fraction by 3D volume is 59 %. The left ventricle has normal function. The left ventricle has no regional wall motion abnormalities. The average left ventricular global longitudinal strain is -16.8 %. The global longitudinal strain is normal. The left ventricular internal cavity size was normal in size. There is no left ventricular hypertrophy. Left ventricular diastolic parameters are consistent  with Grade I diastolic dysfunction (impaired relaxation). Right Ventricle: The right ventricular size is normal. No increase in right ventricular wall thickness. Right ventricular systolic function is normal. There is normal pulmonary artery systolic pressure. The tricuspid regurgitant velocity is 1.79 m/s, and  with an assumed right atrial pressure of 3 mmHg, the estimated  right ventricular systolic pressure is 15.8 mmHg. Left Atrium: Left atrial size was normal in size. Right Atrium: Right atrial size was normal in size. Pericardium: There is no evidence of pericardial effusion. Mitral Valve: The mitral valve is normal in structure. No evidence of mitral valve regurgitation. No evidence of mitral valve stenosis. Tricuspid Valve: The tricuspid valve is normal in structure. Tricuspid valve regurgitation is not demonstrated. No evidence of tricuspid stenosis. Aortic Valve: The aortic valve is normal in structure. Aortic valve regurgitation is mild. Aortic regurgitation PHT measures 497 msec. No aortic stenosis is present. Pulmonic Valve: The pulmonic valve was normal in structure. Pulmonic valve regurgitation is mild. No evidence of  pulmonic stenosis. Aorta: The aortic root is normal in size and structure. Venous: The inferior vena cava is normal in size with greater than 50% respiratory variability, suggesting right atrial pressure of 3 mmHg. IAS/Shunts: No atrial level shunt detected by color flow Doppler.  LEFT VENTRICLE PLAX 2D LVIDd:         3.80 cm         Diastology LVIDs:         2.50 cm         LV e' medial:    7.51 cm/s LV PW:         1.00 cm         LV E/e' medial:  7.0 LV IVS:        1.00 cm         LV e' lateral:   8.16 cm/s LVOT diam:     1.90 cm         LV E/e' lateral: 6.4 LV SV:         49 LV SV Index:   30              2D LVOT Area:     2.84 cm        Longitudinal                                Strain                                2D Strain GLS  -16.8 % LV Volumes (MOD)               Avg: LV vol d, MOD    62.3 ml A2C:                           3D Volume EF LV vol d, MOD    68.5 ml       LV 3D EF:    Left A4C:                                        ventricul LV vol s, MOD    25.6 ml                    ar A2C:                                        ejection LV vol s, MOD    24.1 ml  fraction A4C:                                        by 3D LV SV MOD A2C:   36.7 ml                    volume is LV SV MOD A4C:   68.5 ml                    59 %. LV SV MOD BP:    39.9 ml                                 3D Volume EF:                                3D EF:        59 %                                LV EDV:       116 ml                                LV ESV:       48 ml                                LV SV:        68 ml RIGHT VENTRICLE             IVC RV Basal diam:  3.00 cm     IVC diam: 1.40 cm RV S prime:     10.20 cm/s TAPSE (M-mode): 1.9 cm LEFT ATRIUM             Index        RIGHT ATRIUM           Index LA diam:        3.40 cm 2.10 cm/m   RA Area:     11.20 cm LA Vol (A2C):   49.4 ml 30.56 ml/m  RA Volume:   22.50 ml  13.92 ml/m LA Vol (A4C):   28.3 ml 17.51 ml/m LA Biplane Vol: 38.6 ml 23.88 ml/m   AORTIC VALVE LVOT Vmax:   79.80 cm/s LVOT Vmean:  49.400 cm/s LVOT VTI:    0.173 m AI PHT:      497 msec  AORTA Ao Root diam: 3.20 cm MITRAL VALVE               TRICUSPID VALVE MV Area (PHT): 2.84 cm    TR Peak grad:   12.8 mmHg MV Decel Time: 267 msec    TR Vmax:        179.00 cm/s MV E velocity: 52.40 cm/s MV A velocity: 69.80 cm/s  SHUNTS MV E/A ratio:  0.75        Systemic VTI:  0.17 m                            Systemic Diam: 1.90 cm Donato Schultz MD Electronically  signed by Donato Schultz MD Signature Date/Time: 11/05/2022/2:11:35 PM    Final       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/14/2018 Initial Diagnosis   Metastasis to lymph nodes (HCC)   10/21/2018 - 02/24/2019 Chemotherapy   The patient had palonosetron (ALOXI) injection 0.25 mg, 0.25 mg, Intravenous,  Once, 7 of 7 cycles Administration: 0.25 mg (10/21/2018), 0.25 mg (11/11/2018), 0.25 mg (12/02/2018), 0.25 mg (12/23/2018), 0.25 mg (01/13/2019), 0.25 mg (02/03/2019), 0.25 mg (02/24/2019) CARBOplatin (PARAPLATIN) 310 mg in sodium chloride 0.9 % 250 mL chemo infusion, 310 mg, Intravenous,  Once, 7 of 7 cycles Dose modification: 300 mg (original dose 311.5 mg, Cycle 4, Reason: Dose not tolerated) Administration: 310 mg (10/21/2018), 300 mg (11/11/2018), 300 mg (12/02/2018), 300 mg (12/23/2018), 300 mg (01/13/2019), 300 mg (02/03/2019), 300 mg (02/24/2019) PACLitaxel (TAXOL) 222 mg in sodium chloride 0.9 % 250 mL chemo infusion (> 80mg /m2), 135 mg/m2 = 222 mg, Intravenous,  Once, 7 of 7 cycles Administration: 222 mg (10/21/2018), 222 mg (11/11/2018), 222 mg (12/02/2018), 222 mg (12/23/2018), 222 mg (01/13/2019), 222 mg (02/03/2019), 222 mg (02/24/2019) fosaprepitant (EMEND) 150 mg, dexamethasone (DECADRON) 12 mg in sodium chloride 0.9 % 145 mL IVPB, , Intravenous,  Once, 7 of 7 cycles Administration:  (10/21/2018),  (11/11/2018),  (12/02/2018),  (12/23/2018),  (01/13/2019),  (02/03/2019),  (02/24/2019)  for chemotherapy treatment.    04/02/2022 -  Chemotherapy   Patient is on  Treatment Plan : BREAST MAINTENANCE Trastuzumab IV (6) or SQ (600) D1 q21d X 11 Cycles       PHYSICAL EXAMINATION: ECOG PERFORMANCE STATUS: 0 - Asymptomatic  Vitals:   12/19/22 0950  BP: (!) 137/59  Pulse: 74  Resp: 18  Temp: 98 F (36.7 C)  SpO2: 96%   Filed Weights   12/19/22 0950  Weight: 137 lb (62.1 kg)    GENERAL:alert, no distress and comfortable  NEURO: alert & oriented x 3 with fluent speech, no focal motor/sensory deficits  LABORATORY DATA:  I have reviewed the data as listed    Component Value Date/Time   NA 135 11/05/2022 1215   NA 139 02/13/2017 1512   K 3.6 11/05/2022 1215   K 4.2 02/13/2017 1512   CL 99 11/05/2022 1215   CO2 27 11/05/2022 1215   CO2 28 02/13/2017 1512   GLUCOSE 93 11/05/2022 1215   GLUCOSE 118 02/13/2017 1512   BUN 21 11/05/2022 1215   BUN 25.2 02/13/2017 1512   CREATININE 1.20 (H) 11/05/2022 1215   CREATININE 1.2 (H) 02/13/2017 1512   CALCIUM 9.4 11/05/2022 1215   CALCIUM 10.0 02/13/2017 1512   PROT 6.8 11/05/2022 1215   ALBUMIN 4.2 11/05/2022 1215   AST 19 11/05/2022 1215   ALT 12 11/05/2022 1215   ALKPHOS 55 11/05/2022 1215   BILITOT 0.4 11/05/2022 1215   GFRNONAA 45 (L) 11/05/2022 1215   GFRAA 47 (L) 03/28/2020 0933   GFRAA 42 (L) 12/21/2019 0850    No results found for: "SPEP", "UPEP"  Lab Results  Component Value Date   WBC 6.5 11/05/2022   NEUTROABS 4.4 11/05/2022   HGB 11.5 (L) 11/05/2022   HCT 31.0 (L) 11/05/2022   MCV 87.6 11/05/2022   PLT 251 11/05/2022      Chemistry      Component Value Date/Time   NA 135 11/05/2022 1215   NA 139 02/13/2017 1512   K 3.6 11/05/2022 1215   K 4.2 02/13/2017 1512   CL 99 11/05/2022 1215   CO2 27 11/05/2022 1215  CO2 28 02/13/2017 1512   BUN 21 11/05/2022 1215   BUN 25.2 02/13/2017 1512   CREATININE 1.20 (H) 11/05/2022 1215   CREATININE 1.2 (H) 02/13/2017 1512      Component Value Date/Time   CALCIUM 9.4 11/05/2022 1215   CALCIUM 10.0 02/13/2017 1512    ALKPHOS 55 11/05/2022 1215   AST 19 11/05/2022 1215   ALT 12 11/05/2022 1215   BILITOT 0.4 11/05/2022 1215

## 2022-12-19 NOTE — Assessment & Plan Note (Signed)
Renal function is stable. Monitor closely every other visit

## 2022-12-19 NOTE — Assessment & Plan Note (Signed)
Her last imaging showed no evidence of cancer recurrence Appointment to see gastroenterologist to address changes near her stomach is pending Echocardiogram was normal She will continue trastuzumab indefinitely

## 2022-12-19 NOTE — Patient Instructions (Signed)
[facility]  Discharge Instructions: Thank you for choosing [facility] to provide your oncology and hematology care.   If you have a lab appointment with the Cancer Center, please go directly to the Cancer Center and check in at the registration area.   Wear comfortable clothing and clothing appropriate for easy access to any Portacath or PICC line.   We strive to give you quality time with your provider. You may need to reschedule your appointment if you arrive late (15 or more minutes).  Arriving late affects you and other patients whose appointments are after yours.  Also, if you miss three or more appointments without notifying the office, you may be dismissed from the clinic at the provider's discretion.      For prescription refill requests, have your pharmacy contact our office and allow 72 hours for refills to be completed.    Today you received the following chemotherapy and/or immunotherapy agents: Trastuzumab      To help prevent nausea and vomiting after your treatment, we encourage you to take your nausea medication as directed.  BELOW ARE SYMPTOMS THAT SHOULD BE REPORTED IMMEDIATELY: *FEVER GREATER THAN 100.4 F (38 C) OR HIGHER *CHILLS OR SWEATING *NAUSEA AND VOMITING THAT IS NOT CONTROLLED WITH YOUR NAUSEA MEDICATION *UNUSUAL SHORTNESS OF BREATH *UNUSUAL BRUISING OR BLEEDING *URINARY PROBLEMS (pain or burning when urinating, or frequent urination) *BOWEL PROBLEMS (unusual diarrhea, constipation, pain near the anus) TENDERNESS IN MOUTH AND THROAT WITH OR WITHOUT PRESENCE OF ULCERS (sore throat, sores in mouth, or a toothache) UNUSUAL RASH, SWELLING OR PAIN  UNUSUAL VAGINAL DISCHARGE OR ITCHING   Items with * indicate a potential emergency and should be followed up as soon as possible or go to the Emergency Department if any problems should occur.  Please show the CHEMOTHERAPY ALERT CARD or IMMUNOTHERAPY ALERT CARD at  check-in to the Emergency Department and triage nurse.  Should you have questions after your visit or need to cancel or reschedule your appointment, please contact [facility]  Dept: [phone]  and follow the prompts.  Office hours are 8:00 a.m. to 4:30 p.m. Monday - Friday. Please note that voicemails left after 4:00 p.m. may not be returned until the following business day.  We are closed weekends and major holidays. You have access to a nurse at all times for urgent questions. Please call the main number to the clinic Dept: [phone] and follow the prompts.   For any non-urgent questions, you may also contact your provider using MyChart. We now offer e-Visits for anyone 18 and older to request care online for non-urgent symptoms. For details visit [email or web address].   Also download the MyChart app! Go to the app store, search "MyChart", open the app, select [facility], and log in with your MyChart username and password.  

## 2022-12-20 ENCOUNTER — Other Ambulatory Visit: Payer: Self-pay

## 2022-12-23 ENCOUNTER — Encounter: Payer: Self-pay | Admitting: Physician Assistant

## 2022-12-23 ENCOUNTER — Ambulatory Visit: Payer: Medicare HMO | Admitting: Physician Assistant

## 2022-12-23 VITALS — BP 154/78 | HR 71 | Ht 62.0 in | Wt 136.4 lb

## 2022-12-23 DIAGNOSIS — C541 Malignant neoplasm of endometrium: Secondary | ICD-10-CM

## 2022-12-23 DIAGNOSIS — R933 Abnormal findings on diagnostic imaging of other parts of digestive tract: Secondary | ICD-10-CM | POA: Diagnosis not present

## 2022-12-23 DIAGNOSIS — D539 Nutritional anemia, unspecified: Secondary | ICD-10-CM | POA: Diagnosis not present

## 2022-12-23 DIAGNOSIS — K760 Fatty (change of) liver, not elsewhere classified: Secondary | ICD-10-CM | POA: Diagnosis not present

## 2022-12-23 DIAGNOSIS — Z86010 Personal history of colonic polyps: Secondary | ICD-10-CM | POA: Diagnosis not present

## 2022-12-23 DIAGNOSIS — K5730 Diverticulosis of large intestine without perforation or abscess without bleeding: Secondary | ICD-10-CM

## 2022-12-23 DIAGNOSIS — Z860101 Personal history of adenomatous and serrated colon polyps: Secondary | ICD-10-CM

## 2022-12-23 DIAGNOSIS — K219 Gastro-esophageal reflux disease without esophagitis: Secondary | ICD-10-CM | POA: Diagnosis not present

## 2022-12-23 NOTE — Patient Instructions (Addendum)
Miralax is an osmotic laxative.  It only brings more water into the stool.  This is safe to take daily.  Can take up to 17 gram of miralax twice a day.  Mix with juice or coffee.  Start 1 capful at night for 3-4 days and reassess your response in 3-4 days.  You can increase and decrease the dose based on your response.  Remember, it can take up to 3-4 days to take effect OR for the effects to wear off.   I often pair this with benefiber in the morning to help assure the stool is not too loose.   Toileting tips to help with your constipation - Drink at least 64-80 ounces of water/liquid per day. - Establish a time to try to move your bowels every day.  For many people, this is after a cup of coffee or after a meal such as breakfast. - Sit all of the way back on the toilet keeping your back fairly straight and while sitting up, try to rest the tops of your forearms on your upper thighs.   - Raising your feet with a step stool/squatty potty can be helpful to improve the angle that allows your stool to pass through the rectum. - Relax the rectum feeling it bulge toward the toilet water.  If you feel your rectum raising toward your body, you are contracting rather than relaxing. - Breathe in and slowly exhale. "Belly breath" by expanding your belly towards your belly button. Keep belly expanded as you gently direct pressure down and back to the anus.  A low pitched GRRR sound can assist with increasing intra-abdominal pressure.  (Can also trying to blow on a pinwheel and make it move, this helps with the same belly breathing) - Repeat 3-4 times. If unsuccessful, contract the pelvic floor to restore normal tone and get off the toilet.  Avoid excessive straining. - To reduce excessive wiping by teaching your anus to normally contract, place hands on outer aspect of knees and resist knee movement outward.  Hold 5-10 second then place hands just inside of knees and resist inward movement of knees.  Hold 5  seconds.  Repeat a few times each way.  Go to the ER if unable to pass gas, severe AB pain, unable to hold down food, any shortness of breath of chest pain.  _______________________________________________________  If your blood pressure at your visit was 140/90 or greater, please contact your primary care physician to follow up on this.  _______________________________________________________  If you are age 83 or older, your body mass index should be between 23-30. Your Body mass index is 24.94 kg/m. If this is out of the aforementioned range listed, please consider follow up with your Primary Care Provider.  If you are age 83 or younger, your body mass index should be between 19-25. Your Body mass index is 24.94 kg/m. If this is out of the aformentioned range listed, please consider follow up with your Primary Care Provider.   ________________________________________________________  The [facility] providers would like to encourage you to use [facility] to communicate with providers for non-urgent requests or questions.  Due to long hold times on the telephone, sending your provider a message by [facility] may be a faster and more efficient way to get a response.  Please allow 48 business hours for a response.  Please remember that this is for non-urgent requests.  _______________________________________________________ It was a pleasure to see you today!  Thank you for trusting me with  your gastrointestinal care!

## 2022-12-23 NOTE — Progress Notes (Signed)
Noted  

## 2023-01-01 DIAGNOSIS — M79675 Pain in left toe(s): Secondary | ICD-10-CM | POA: Diagnosis not present

## 2023-01-01 DIAGNOSIS — L600 Ingrowing nail: Secondary | ICD-10-CM | POA: Diagnosis not present

## 2023-01-06 DIAGNOSIS — E039 Hypothyroidism, unspecified: Secondary | ICD-10-CM | POA: Diagnosis not present

## 2023-01-06 DIAGNOSIS — I700 Atherosclerosis of aorta: Secondary | ICD-10-CM | POA: Diagnosis not present

## 2023-01-06 DIAGNOSIS — E041 Nontoxic single thyroid nodule: Secondary | ICD-10-CM | POA: Diagnosis not present

## 2023-01-07 ENCOUNTER — Inpatient Hospital Stay: Payer: Medicare HMO

## 2023-01-07 ENCOUNTER — Other Ambulatory Visit: Payer: Self-pay

## 2023-01-07 VITALS — BP 141/65 | HR 66 | Temp 98.4°F | Resp 16 | Ht 62.0 in | Wt 138.8 lb

## 2023-01-07 DIAGNOSIS — C541 Malignant neoplasm of endometrium: Secondary | ICD-10-CM

## 2023-01-07 DIAGNOSIS — I1310 Hypertensive heart and chronic kidney disease without heart failure, with stage 1 through stage 4 chronic kidney disease, or unspecified chronic kidney disease: Secondary | ICD-10-CM | POA: Diagnosis not present

## 2023-01-07 DIAGNOSIS — C773 Secondary and unspecified malignant neoplasm of axilla and upper limb lymph nodes: Secondary | ICD-10-CM

## 2023-01-07 DIAGNOSIS — N183 Chronic kidney disease, stage 3 unspecified: Secondary | ICD-10-CM

## 2023-01-07 DIAGNOSIS — Z7189 Other specified counseling: Secondary | ICD-10-CM

## 2023-01-07 DIAGNOSIS — Z7982 Long term (current) use of aspirin: Secondary | ICD-10-CM | POA: Diagnosis not present

## 2023-01-07 DIAGNOSIS — Z90710 Acquired absence of both cervix and uterus: Secondary | ICD-10-CM | POA: Diagnosis not present

## 2023-01-07 DIAGNOSIS — Z79899 Other long term (current) drug therapy: Secondary | ICD-10-CM | POA: Diagnosis not present

## 2023-01-07 DIAGNOSIS — Z5112 Encounter for antineoplastic immunotherapy: Secondary | ICD-10-CM | POA: Diagnosis not present

## 2023-01-07 LAB — CBC WITH DIFFERENTIAL/PLATELET
Abs Immature Granulocytes: 0.03 10*3/uL (ref 0.00–0.07)
Basophils Absolute: 0.1 10*3/uL (ref 0.0–0.1)
Basophils Relative: 2 %
Eosinophils Absolute: 0.2 10*3/uL (ref 0.0–0.5)
Eosinophils Relative: 4 %
HCT: 31 % — ABNORMAL LOW (ref 36.0–46.0)
Hemoglobin: 10.7 g/dL — ABNORMAL LOW (ref 12.0–15.0)
Immature Granulocytes: 1 %
Lymphocytes Relative: 24 %
Lymphs Abs: 1.5 10*3/uL (ref 0.7–4.0)
MCH: 31.5 pg (ref 26.0–34.0)
MCHC: 34.5 g/dL (ref 30.0–36.0)
MCV: 91.2 fL (ref 80.0–100.0)
Monocytes Absolute: 0.6 10*3/uL (ref 0.1–1.0)
Monocytes Relative: 10 %
Neutro Abs: 3.6 10*3/uL (ref 1.7–7.7)
Neutrophils Relative %: 59 %
Platelets: 233 10*3/uL (ref 150–400)
RBC: 3.4 MIL/uL — ABNORMAL LOW (ref 3.87–5.11)
RDW: 12.1 % (ref 11.5–15.5)
WBC: 6 10*3/uL (ref 4.0–10.5)
nRBC: 0 % (ref 0.0–0.2)

## 2023-01-07 LAB — COMPREHENSIVE METABOLIC PANEL
ALT: 13 U/L (ref 0–44)
AST: 20 U/L (ref 15–41)
Albumin: 4 g/dL (ref 3.5–5.0)
Alkaline Phosphatase: 60 U/L (ref 38–126)
Anion gap: 9 (ref 5–15)
BUN: 23 mg/dL (ref 8–23)
CO2: 27 mmol/L (ref 22–32)
Calcium: 9 mg/dL (ref 8.9–10.3)
Chloride: 100 mmol/L (ref 98–111)
Creatinine, Ser: 1.3 mg/dL — ABNORMAL HIGH (ref 0.44–1.00)
GFR, Estimated: 41 mL/min — ABNORMAL LOW (ref >60)
Glucose, Bld: 104 mg/dL — ABNORMAL HIGH (ref 70–99)
Potassium: 3.7 mmol/L (ref 3.5–5.1)
Sodium: 136 mmol/L (ref 135–145)
Total Bilirubin: 0.4 mg/dL (ref 0.3–1.2)
Total Protein: 6.9 g/dL (ref 6.5–8.1)

## 2023-01-07 MED ORDER — SODIUM CHLORIDE 0.9 % IV SOLN: Freq: Once | INTRAVENOUS | Status: AC

## 2023-01-07 MED ORDER — HEPARIN SOD (PORK) LOCK FLUSH 100 UNIT/ML IV SOLN
500.0000 [IU] | Freq: Once | INTRAVENOUS | Status: AC | PRN
  Administered 2023-01-07: 500 [IU]

## 2023-01-07 MED ORDER — SODIUM CHLORIDE 0.9% FLUSH
10.0000 mL | INTRAVENOUS | Status: DC | PRN
  Administered 2023-01-07: 10 mL

## 2023-01-07 MED ORDER — TRASTUZUMAB-ANNS CHEMO 150 MG IV SOLR
6.0000 mg/kg | Freq: Once | INTRAVENOUS | Status: AC
  Administered 2023-01-07: 357 mg via INTRAVENOUS
  Filled 2023-01-07: qty 17

## 2023-01-07 MED ORDER — SODIUM CHLORIDE 0.9% FLUSH
10.0000 mL | Freq: Once | INTRAVENOUS | Status: AC
  Administered 2023-01-07: 10 mL

## 2023-01-07 NOTE — Patient Instructions (Signed)
[facility]  Discharge Instructions: Thank you for choosing [facility] to provide your oncology and hematology care.   If you have a lab appointment with the Cancer Center, please go directly to the Cancer Center and check in at the registration area.   Wear comfortable clothing and clothing appropriate for easy access to any Portacath or PICC line.   We strive to give you quality time with your provider. You may need to reschedule your appointment if you arrive late (15 or more minutes).  Arriving late affects you and other patients whose appointments are after yours.  Also, if you miss three or more appointments without notifying the office, you may be dismissed from the clinic at the provider's discretion.      For prescription refill requests, have your pharmacy contact our office and allow 72 hours for refills to be completed.    Today you received the following chemotherapy and/or immunotherapy agents: Kanjinti.      To help prevent nausea and vomiting after your treatment, we encourage you to take your nausea medication as directed.  BELOW ARE SYMPTOMS THAT SHOULD BE REPORTED IMMEDIATELY: *FEVER GREATER THAN 100.4 F (38 C) OR HIGHER *CHILLS OR SWEATING *NAUSEA AND VOMITING THAT IS NOT CONTROLLED WITH YOUR NAUSEA MEDICATION *UNUSUAL SHORTNESS OF BREATH *UNUSUAL BRUISING OR BLEEDING *URINARY PROBLEMS (pain or burning when urinating, or frequent urination) *BOWEL PROBLEMS (unusual diarrhea, constipation, pain near the anus) TENDERNESS IN MOUTH AND THROAT WITH OR WITHOUT PRESENCE OF ULCERS (sore throat, sores in mouth, or a toothache) UNUSUAL RASH, SWELLING OR PAIN  UNUSUAL VAGINAL DISCHARGE OR ITCHING   Items with * indicate a potential emergency and should be followed up as soon as possible or go to the Emergency Department if any problems should occur.  Please show the CHEMOTHERAPY ALERT CARD or IMMUNOTHERAPY ALERT CARD at  check-in to the Emergency Department and triage nurse.  Should you have questions after your visit or need to cancel or reschedule your appointment, please contact [facility]  Dept: [phone]  and follow the prompts.  Office hours are 8:00 a.m. to 4:30 p.m. Monday - Friday. Please note that voicemails left after 4:00 p.m. may not be returned until the following business day.  We are closed weekends and major holidays. You have access to a nurse at all times for urgent questions. Please call the main number to the clinic Dept: [phone] and follow the prompts.   For any non-urgent questions, you may also contact your provider using MyChart. We now offer e-Visits for anyone 18 and older to request care online for non-urgent symptoms. For details visit [email or web address].   Also download the MyChart app! Go to the app store, search "MyChart", open the app, select [facility], and log in with your MyChart username and password.   

## 2023-01-20 ENCOUNTER — Ambulatory Visit: Payer: Medicare HMO | Admitting: Internal Medicine

## 2023-01-20 ENCOUNTER — Encounter: Payer: Self-pay | Admitting: Internal Medicine

## 2023-01-20 VITALS — BP 150/62 | HR 66 | Temp 97.1°F | Resp 16 | Ht 62.0 in | Wt 136.0 lb

## 2023-01-20 DIAGNOSIS — D490 Neoplasm of unspecified behavior of digestive system: Secondary | ICD-10-CM | POA: Diagnosis not present

## 2023-01-20 DIAGNOSIS — R933 Abnormal findings on diagnostic imaging of other parts of digestive tract: Secondary | ICD-10-CM

## 2023-01-20 DIAGNOSIS — K317 Polyp of stomach and duodenum: Secondary | ICD-10-CM | POA: Diagnosis not present

## 2023-01-20 DIAGNOSIS — I10 Essential (primary) hypertension: Secondary | ICD-10-CM | POA: Diagnosis not present

## 2023-01-20 MED ORDER — SODIUM CHLORIDE 0.9 % IV SOLN
500.0000 mL | INTRAVENOUS | Status: DC
Start: 2023-01-20 — End: 2023-01-20

## 2023-01-20 NOTE — Progress Notes (Signed)
Pt's states no medical or surgical changes since previsit or office visit. 

## 2023-01-20 NOTE — Progress Notes (Signed)
12/23/2022 KOREN MARAN [id number] [birth year]   Referring provider: Geoffry Paradise, MD Primary GI doctor: Dr. Marina Goodell   ASSESSMENT AND PLAN:    Endometrial cancer [facility]) on infusion, following Dr. Bertis Ruddy presents with abnormal CT scan of GI tract History of gastric submucosal lesion noted in 2008 Last EUS 12/2014 Dr. Christella Hartigan without changes in size for 2 years, no surgical resection warranted- no follow up  CT AB and pelvis, chest with contrast for endometrial cancer showed nodular area along upper aspect of stomach fold thickening, 18x 14 mm, nodule areas on antrum  No GERD symptoms Possible benign gastric hyperplastic polyps but will schedule EGD to rule out malignancy at [facility] with Dr. Marina Goodell.  I discussed risks of EGD with patient today, including risk of sedation, bleeding or perforation.  Patient provides understanding and gave verbal consent to proceed.   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 Add on fiber supplement, avoid NSAIDS, information given   Fatty liver --Continue to work on risk factor modification including diet exercise and control of risk factors including blood sugars. - monitor q 6 months.    History of adenomatous polyp of colon 05/13/2018 colonoscopy with Dr. Marina Goodell for screening purposes 6 mm polyp ascending colon multiple small and large mouth diverticula, tubular adenoma, no routine surveillance based on age.   Patient Care Team: Geoffry Paradise, [name]   HISTORY OF PRESENT ILLNESS: 82 y.o. 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   01/05/2015 upper EUS by Dr. Christella Hartigan for gastric submucosal lesion noted 2008, previous evaluation 2014, at that time the gastric submucosal lesion had not changed in size in the past 2 years and had not significantly changed in 8 years since first EUS and did not warrant surgical resection.   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  05/13/2018 colonoscopy with Dr. Marina Goodell for screening purposes 6 mm polyp ascending colon multiple small and large mouth diverticula, tubular adenoma, no routine surveillance based on age.   Patient following with Dr. Emeline Darling such for endometrial cancer high-grade diagnosed 2018, gets infusion every 3 weeks, has portacath.    11/05/2022 Ct chest AB and pelvis for cervical cancer showed fatty liver, gallbladder non dilated, normal pancreas and spleen, Large bowel extensive left sided stool burden, diverticulosis, nodular area along upper aspect of stomach fold thickening, 18x 14 mm, nodule areas on antrum   Patient denies GERD, she is on pantoprazole.  She denies dysphagia, melena. Denies AB pain.  She denies nausea, vomiting.  She  denies AB bloating.  No unintentional weight loss, no night sweats. She has been off her thyroid medication for 6 weeks and has gained weight.  She denies blood thinner use.  She denies NSAID use. On tylenol nightly.  She reports ETOH use 1-2 glasses of wine Thursday night, Saturday 1 drink, and 1 drink Sunday. Will drink wine.  She denies tobacco use.  She denies drug use.     She  reports that she has never smoked. She has never used smokeless tobacco. She reports current alcohol use of about 3.0 standard drinks of alcohol per week. She reports that she does not use drugs.   RELEVANT LABS AND IMAGING: CBC Labs (Brief)          Component  Value Date/Time    WBC 6.5 11/05/2022 1215    WBC 6.2 03/12/2022 1000    RBC 3.54 (L) 11/05/2022 1215    HGB 11.5 (L) 11/05/2022 1215    HCT 31.0 (L) 11/05/2022 1215    PLT 251 11/05/2022 1215    MCV 87.6 11/05/2022 1215    MCH 32.5 11/05/2022 1215    MCHC 37.1 (H) 11/05/2022 1215    RDW 11.8 11/05/2022 1215    LYMPHSABS 1.3 11/05/2022 1215    MONOABS 0.6 11/05/2022 1215    EOSABS 0.2 11/05/2022 1215    BASOSABS 0.1 11/05/2022 1215      Recent Labs (within last 365 days)           Recent Labs    01/08/22 0934 03/12/22 1000 04/23/22 1017 06/04/22 0854 07/18/22 0929 09/03/22 0812 11/05/22 1215  HGB 10.6* 10.9* 10.9* 10.7* 10.6* 10.7* 11.5*        CMP     Labs (Brief)          Component Value Date/Time    NA 135 11/05/2022 1215    NA 139 02/13/2017 1512    K 3.6 11/05/2022 1215    K 4.2 02/13/2017 1512    CL 99 11/05/2022 1215    CO2 27 11/05/2022 1215    CO2 28 02/13/2017 1512    GLUCOSE 93 11/05/2022 1215    GLUCOSE 118 02/13/2017 1512    BUN 21 11/05/2022 1215    BUN 25.2 02/13/2017 1512    CREATININE 1.20 (H) 11/05/2022 1215    CREATININE 1.2 (H) 02/13/2017 1512    CALCIUM 9.4 11/05/2022 1215    CALCIUM 10.0 02/13/2017 1512    PROT 6.8 11/05/2022 1215    ALBUMIN 4.2 11/05/2022 1215    AST 19 11/05/2022 1215    ALT 12 11/05/2022 1215    ALKPHOS 55 11/05/2022 1215    BILITOT 0.4 11/05/2022 1215    GFRNONAA 45 (L) 11/05/2022 1215    GFRAA 47 (L) 03/28/2020 0933    GFRAA 42 (L) 12/21/2019 0850          Latest Ref Rng & Units 11/05/2022   12:15 PM 03/12/2022   10:00 AM 01/08/2022    9:34 AM  Hepatic Function  Total Protein 6.5 - 8.1 g/dL 6.8  6.8  6.7   Albumin 3.5 - 5.0 g/dL 4.2  4.4  4.1   AST 15 - 41 U/L 19  23  20    ALT 0 - 44 U/L 12  13  14    Alk Phosphatase 38 - 126 U/L 55  51  61   Total Bilirubin 0.3 - 1.2 mg/dL 0.4  0.4  0.4       Current Medications:    Current Outpatient Medications (Endocrine & Metabolic):    levothyroxine  (SYNTHROID, LEVOTHROID) 100 MCG tablet, Take 100 mcg by mouth daily before breakfast.    Current Outpatient Medications (Cardiovascular):    atorvastatin (LIPITOR) 20 MG tablet, Take 20 mg by mouth every evening.    losartan-hydrochlorothiazide (HYZAAR) 100-25 MG tablet,    Metoprolol-Hydrochlorothiazide 25-12.5 MG TB24, 1 tablet daily in the afternoon.   olmesartan-hydrochlorothiazide (BENICAR HCT) 40-25 MG tablet, Take  1 tablet by mouth daily.     Current Outpatient Medications (Analgesics):    acetaminophen (TYLENOL) 500 MG tablet, Take 1,000 mg by mouth every 6 (six) hours as needed.   aspirin EC 81 MG tablet, Take 81 mg by mouth daily.     Current Outpatient Medications (Other):    citalopram (CELEXA) 20 MG tablet, Take 20 mg by mouth daily.   Multiple Vitamin (MULTIVITAMIN WITH MINERALS) TABS, Take 1 tablet by mouth daily.   pantoprazole (PROTONIX) 40 MG tablet, Take 40 mg by mouth daily.   estradiol (ESTRACE) 0.1 MG/GM vaginal cream, PLACE 1 APPLICATORFUL VAGINALLY 3 TIMES A WEEK   lidocaine-prilocaine (EMLA) cream, Apply 1 Application topically as needed. (Patient not taking: Reported on 12/23/2022)   valACYclovir (VALTREX) 1000 MG tablet, valacyclovir 1 gram tablet   Medical History:      Past Medical History:  Diagnosis Date   Aortic atherosclerosis (HCC)     Arthritis     Blood transfusion without reported diagnosis     Breast cancer (HCC)       LEFT, '82-left breast cancer-surgery only   Cataract      removed both eyes   Complication of anesthesia      nausea   Diverticulosis     Esophageal stricture     Family history of stomach cancer     GERD (gastroesophageal reflux disease)     Hemorrhoids     Hiatal hernia     High triglycerides     Hypercholesteremia     Hypertension     Hypothyroidism     Osteoarthritis     PONV (postoperative nausea and vomiting)     Uterine cancer (HCC)      Allergies: No Known Allergies    Surgical History:  She  has a past  surgical history that includes Ovary surgery (8119-1478); Mastectomy, radical (Left, 1982); Rotator cuff repair (Left); Total knee arthroplasty (Bilateral, 2009-2010); Foot surgery (Left, 2009-2010); Lumbar disc surgery (01-01-13); Simple mastectomy (Left, 1982); EUS (N/A, 01/07/2013); left heart catheterization with coronary angiogram (N/A, 01/24/2014); EUS (N/A, 01/05/2015); Robotic assisted total hysterectomy with bilateral salpingo oophorectomy (N/A, 10/22/2016); Sentinel node biopsy (N/A, 10/22/2016); Upper gastrointestinal endoscopy; Colonoscopy; and IR IMAGING GUIDED PORT INSERTION (10/19/2018). Family History:  Her family history includes Colon polyps in her brother and sister; Heart attack in her brother and father; Heart disease in her mother; Other in her mother; Stomach cancer in her maternal aunt.   REVIEW OF SYSTEMS  : All other systems reviewed and negative except where noted in the History of Present Illness.   PHYSICAL EXAM: BP (!) 154/78   Pulse 71   Ht 5\' 2"  (1.575 m)   Wt 136 lb 6 oz (61.9 kg)   BMI 24.94 kg/m  General Appearance: Well nourished, in no apparent distress. Head:   Normocephalic and atraumatic. Eyes:  sclerae anicteric,conjunctive pink  Respiratory: Respiratory effort normal, BS equal bilaterally without rales, rhonchi, wheezing. Cardio: RRR with no MRGs. Peripheral pulses intact.  Abdomen: Soft,  Obese ,active bowel sounds. No tenderness . Without guarding and Without rebound. No masses. Rectal: Not evaluated Musculoskeletal: Full ROM, Normal gait. Without edema. Skin:  Dry and intact without significant lesions or rashes Neuro: Alert and  oriented x4;  No focal deficits. Psych:  Cooperative. Normal mood and affect.      Doree Albee, PA-C 2:40 PM

## 2023-01-20 NOTE — Progress Notes (Signed)
Report to PACU, RN, vss, BBS= Clear.  

## 2023-01-20 NOTE — Op Note (Signed)
[facility] Patient Name: Vicki Cervantes Procedure Date: 01/20/2023 9:36 AM MRN: [id number] Endoscopist: Wilhemina Bonito. Marina Goodell , MD, [id number] Age: 82 Referring MD:  Date of Birth: 1941-02-25 Gender: Female Account #: [id number] Procedure:                Upper GI endoscopy Indications:              Abnormal CT of the GI tract. Proximal gastric                            lesion measuring 18 x 14 mm. It may be the same                            lesion initially identified in 2008. Last EUS with                            Dr. Christella Hartigan 2016 with 17 mm lesion. Medicines:                Monitored Anesthesia Care Procedure:                Pre-Anesthesia Assessment:                           - Prior to the procedure, a History and Physical                            was performed, and patient medications and                            allergies were reviewed. The patient's tolerance of                            previous anesthesia was also reviewed. The risks                            and benefits of the procedure and the sedation                            options and risks were discussed with the patient.                            All questions were answered, and informed consent                            was obtained. Prior Anticoagulants: The patient has                            taken no anticoagulant or antiplatelet agents. ASA                            Grade Assessment: II - A patient with mild systemic                            disease. After reviewing the risks and benefits,  the patient was deemed in satisfactory condition to                            undergo the procedure.                           After obtaining informed consent, the endoscope was                            passed under direct vision. Throughout the                            procedure, the patient's blood pressure, pulse, and                            oxygen saturations were  monitored continuously. The                            GIF [id number] #[id number] was introduced through the                            mouth, and advanced to the second part of duodenum.                            The upper GI endoscopy was accomplished without                            difficulty. The patient tolerated the procedure                            well. Scope In: Scope Out: Findings:                 The esophagus was normal.                           The stomach revealed an 18 mm submucosal lesion in                            the proximal stomach. Same as previous identified.                            No irregularity or surface ulceration. No                            significant change from previous. Few benign fundic                            gland polyps incidentally noted. Small hiatal                            hernia.                           The examined duodenum was normal.  The cardia and gastric fundus were normal on                            retroflexion. Complications:            No immediate complications. Estimated Blood Loss:     Estimated blood loss: none. Impression:               1. Proximal gastric submucosal lesion measuring                            less than 2 cm (18 mm) without change in                            characteristics previous examinations. Initially                            identified in 2008. Last [place] 2016. Remained stable.                            Requires no further attention                           2. Small hiatal hernia and small benign fundic                            gland polyps                           3. Otherwise normal EGD. Recommendation:           - Patient has a contact number available for                            emergencies. The signs and symptoms of potential                            delayed complications were discussed with the                            patient. Return to normal  activities tomorrow.                            Written discharge instructions were provided to the                            patient.                           - Resume previous diet.                           - Continue present medications                           - Return to the care of your regular physicians. Wilhemina Bonito. Marina Goodell, MD 01/20/2023 10:07:15 AM This report has been signed electronically.

## 2023-01-20 NOTE — Patient Instructions (Signed)
Resume previous diet and continue present medications! Return to care of your regular physicians   YOU HAD AN ENDOSCOPIC PROCEDURE TODAY AT THE [facility]:   Refer to the procedure report that was given to you for any specific questions about what was found during the examination.  If the procedure report does not answer your questions, please call your gastroenterologist to clarify.  If you requested that your care partner not be given the details of your procedure findings, then the procedure report has been included in a sealed envelope for you to review at your convenience later.  YOU SHOULD EXPECT: Some feelings of bloating in the abdomen. Passage of more gas than usual.  Walking can help get rid of the air that was put into your GI tract during the procedure and reduce the bloating. If you had a lower endoscopy (such as a colonoscopy or flexible sigmoidoscopy) you may notice spotting of blood in your stool or on the toilet paper. If you underwent a bowel prep for your procedure, you may not have a normal bowel movement for a few days.  Please Note:  You might notice some irritation and congestion in your nose or some drainage.  This is from the oxygen used during your procedure.  There is no need for concern and it should clear up in a day or so.  SYMPTOMS TO REPORT IMMEDIATELY:   Following upper endoscopy (EGD)  Vomiting of blood or coffee ground material  New chest pain or pain under the shoulder blades  Painful or persistently difficult swallowing  New shortness of breath  Fever of 100F or higher  Black, tarry-looking stools  For urgent or emergent issues, a gastroenterologist can be reached at any hour by calling [phone]. Do not use MyChart messaging for urgent concerns.    DIET:  We do recommend a small meal at first, but then you may proceed to your regular diet.  Drink plenty of fluids but you should avoid alcoholic beverages for 24 hours.  ACTIVITY:  You  should plan to take it easy for the rest of today and you should NOT DRIVE or use heavy machinery until tomorrow (because of the sedation medicines used during the test).    FOLLOW UP: Our staff will call the number listed on your records the next business day following your procedure.  We will call around 7:15- 8:00 am to check on you and address any questions or concerns that you may have regarding the information given to you following your procedure. If we do not reach you, we will leave a message.     If any biopsies were taken you will be contacted by phone or by letter within the next 1-3 weeks.  Please call us at [phone] if you have not heard about the biopsies in 3 weeks.    SIGNATURES/CONFIDENTIALITY: You and/or your care partner have signed paperwork which will be entered into your electronic medical record.  These signatures attest to the fact that that the information above on your After Visit Summary has been reviewed and is understood.  Full responsibility of the confidentiality of this discharge information lies with you and/or your care-partner.

## 2023-01-21 ENCOUNTER — Telehealth: Payer: Self-pay | Admitting: *Deleted

## 2023-01-21 NOTE — Telephone Encounter (Signed)
Left message on f/u call 

## 2023-01-28 ENCOUNTER — Inpatient Hospital Stay: Payer: Medicare HMO | Attending: Gynecologic Oncology | Admitting: Hematology and Oncology

## 2023-01-28 ENCOUNTER — Other Ambulatory Visit: Payer: Self-pay

## 2023-01-28 ENCOUNTER — Inpatient Hospital Stay: Payer: Medicare HMO

## 2023-01-28 ENCOUNTER — Encounter: Payer: Self-pay | Admitting: Hematology and Oncology

## 2023-01-28 VITALS — BP 143/77 | HR 70 | Resp 18 | Ht 62.0 in | Wt 137.4 lb

## 2023-01-28 DIAGNOSIS — Z7189 Other specified counseling: Secondary | ICD-10-CM | POA: Diagnosis not present

## 2023-01-28 DIAGNOSIS — Z9221 Personal history of antineoplastic chemotherapy: Secondary | ICD-10-CM | POA: Diagnosis not present

## 2023-01-28 DIAGNOSIS — Z7982 Long term (current) use of aspirin: Secondary | ICD-10-CM | POA: Diagnosis not present

## 2023-01-28 DIAGNOSIS — C773 Secondary and unspecified malignant neoplasm of axilla and upper limb lymph nodes: Secondary | ICD-10-CM

## 2023-01-28 DIAGNOSIS — C541 Malignant neoplasm of endometrium: Secondary | ICD-10-CM

## 2023-01-28 DIAGNOSIS — Z79899 Other long term (current) drug therapy: Secondary | ICD-10-CM | POA: Diagnosis not present

## 2023-01-28 DIAGNOSIS — I10 Essential (primary) hypertension: Secondary | ICD-10-CM | POA: Diagnosis not present

## 2023-01-28 DIAGNOSIS — Z5112 Encounter for antineoplastic immunotherapy: Secondary | ICD-10-CM | POA: Insufficient documentation

## 2023-01-28 DIAGNOSIS — Z90710 Acquired absence of both cervix and uterus: Secondary | ICD-10-CM | POA: Insufficient documentation

## 2023-01-28 MED ORDER — HEPARIN SOD (PORK) LOCK FLUSH 100 UNIT/ML IV SOLN
500.0000 [IU] | Freq: Once | INTRAVENOUS | Status: AC | PRN
  Administered 2023-01-28: 500 [IU]

## 2023-01-28 MED ORDER — SODIUM CHLORIDE 0.9 % IV SOLN: Freq: Once | INTRAVENOUS | Status: AC

## 2023-01-28 MED ORDER — TRASTUZUMAB-ANNS CHEMO 150 MG IV SOLR
6.0000 mg/kg | Freq: Once | INTRAVENOUS | Status: AC
  Administered 2023-01-28: 357 mg via INTRAVENOUS
  Filled 2023-01-28: qty 17

## 2023-01-28 MED ORDER — SODIUM CHLORIDE 0.9% FLUSH
10.0000 mL | INTRAVENOUS | Status: DC | PRN
  Administered 2023-01-28: 10 mL

## 2023-01-28 NOTE — Assessment & Plan Note (Signed)
Her last imaging showed no evidence of cancer recurrence Her recent EGD report was reviewed, likely benign etiology and does not need further follow-up Echocardiogram is due next month She will continue trastuzumab indefinitely

## 2023-01-28 NOTE — Progress Notes (Signed)
[facility] OFFICE PROGRESS NOTE  Patient Care Team: Geoffry Paradise, [name]  ASSESSMENT & PLAN:  Endometrial cancer [facility]) Her last imaging showed no evidence of cancer recurrence Her recent EGD report was reviewed, likely benign etiology and does not need further follow-up Echocardiogram is due next month She will continue trastuzumab indefinitely   Orders Placed This Encounter  Procedures   ECHOCARDIOGRAM COMPLETE    Standing Status:   Future    Standing Expiration Date:   01/28/2024    Order Specific Question:   Where should this test be performed    Answer:   [facility]    Order Specific Question:   Perflutren DEFINITY (image enhancing agent) should be administered unless hypersensitivity or allergy exist    Answer:   Administer Perflutren    Order Specific Question:   Reason for exam-Echo    Answer:   Chemo  [id number]    All questions were answered. The patient knows to call the clinic with any problems, questions or concerns. The total time spent in the appointment was 20 minutes encounter with patients including review of chart and various tests results, discussions about plan of care and coordination of care plan   Artis Delay, MD 01/28/2023 11:06 AM  INTERVAL HISTORY: Please see below for problem oriented charting. she returns for treatment follow-up on maintenance trastuzumab She tolerated recent treatment well No new side effects We discussed recent EGD report  REVIEW OF SYSTEMS:   Constitutional: Denies fevers, chills or abnormal weight loss Eyes: Denies blurriness of vision Ears, nose, mouth, throat, and face: Denies mucositis or sore throat Respiratory: Denies cough, dyspnea or wheezes Cardiovascular: Denies palpitation, chest discomfort or lower extremity swelling Gastrointestinal:  Denies nausea, heartburn or change in bowel habits Skin: Denies abnormal skin rashes Lymphatics: Denies new lymphadenopathy or easy  bruising Neurological:Denies numbness, tingling or new weaknesses Behavioral/Psych: Mood is stable, no new changes  All other systems were reviewed with the patient and are negative.  I have reviewed the past medical history, past surgical history, social history and family history with the patient and they are unchanged from previous note.  ALLERGIES:  has No Known Allergies.  MEDICATIONS:  Current Outpatient Medications  Medication Sig Dispense Refill   acetaminophen (TYLENOL) 500 MG tablet Take 1,000 mg by mouth every 6 (six) hours as needed.     amLODipine (NORVASC) 5 MG tablet Take 5 mg by mouth daily.     aspirin EC 81 MG tablet Take 81 mg by mouth daily.     atorvastatin (LIPITOR) 20 MG tablet Take 20 mg by mouth daily.     citalopram (CELEXA) 20 MG tablet Take 20 mg by mouth daily.     estradiol (ESTRACE) 0.1 MG/GM vaginal cream PLACE 1 APPLICATORFUL VAGINALLY 3 TIMES A WEEK (Patient not taking: Reported on 01/20/2023) 42.5 g 12   levothyroxine (SYNTHROID, LEVOTHROID) 100 MCG tablet Take 100 mcg by mouth daily before breakfast.      lidocaine-prilocaine (EMLA) cream Apply 1 Application topically as needed. (Patient not taking: Reported on 12/23/2022) 30 g 3   losartan-hydrochlorothiazide (HYZAAR) 100-25 MG tablet      metoprolol succinate (TOPROL-XL) 25 MG 24 hr tablet Take 25 mg by mouth daily.     Multiple Vitamin (MULTIVITAMIN WITH MINERALS) TABS Take 1 tablet by mouth daily.     olmesartan-hydrochlorothiazide (BENICAR HCT) 40-25 MG tablet Take 1 tablet by mouth daily.     pantoprazole (PROTONIX) 40 MG  tablet Take 40 mg by mouth daily.     valACYclovir (VALTREX) 1000 MG tablet valacyclovir 1 gram tablet     No current facility-administered medications for this visit.   Facility-Administered Medications Ordered in Other Visits  Medication Dose Route Frequency Provider Last Rate Last Admin   heparin lock flush 100 unit/mL  500 Units Intracatheter Once PRN Bertis Ruddy, Aaliya Maultsby, MD        sodium chloride flush (NS) 0.9 % injection 10 mL  10 mL Intracatheter PRN Bertis Ruddy, Alysson Geist, MD       trastuzumab-anns (KANJINTI) 357 mg in sodium chloride 0.9 % 250 mL chemo infusion  6 mg/kg (Treatment Plan Recorded) Intravenous Once Artis Delay, MD 534 mL/hr at 01/28/23 1045 357 mg at 01/28/23 1045    SUMMARY OF ONCOLOGIC HISTORY: Oncology History Overview Note  Vicki Cervantes  has a remote history of left  breast cancer at age 62 but received BRCA testing approximately 5 years ago which was negative. Her cancer was treated with surgery but no radiation or chemotherapy.   Serous endometrial cancer, MSI stable ER positive, PR neg, Her2/neu 3+      Endometrial cancer (HCC)  08/29/2016 Pathology Results   Endometrium, biopsy - HIGH GRADE ENDOMETRIAL CARCINOMA, SEE COMMENT. Microscopic Comment The sections show multiple fragments of adenocarcinoma displaying glandular and papillary patterns associated with high grade cytomorphology characterized by nuclear pleomorphism, prominent nucleoli and brisk mitosis. Immunohistochemical stains show that the tumor cells are positive for vimentin, p16, p53 with increased Ki-67 expression. Estrogen and progesterone receptor stains show patchy weak positivity. No significant positivity is seen with CEA. The findings are consistent with high grade endometrial carcinoma and the overall morphology and phenotypic features favor serous carcinoma.   08/29/2016 Initial Diagnosis   She presented with postmenopausal bleeding   10/03/2016 Imaging   CT C/A/P 09/2016 IMPRESSION: 1. Thickening of the endometrial canal up to 19 mm in fundus, presumably corresponding to the patient's reported endometrial carcinoma. 2. Multiple tiny pulmonary nodules scattered throughout the lungs bilaterally measuring 4 mm or less in size. Nodules of this size are typically considered statistically likely benign. In the setting of known primary malignancy, metastatic disease to the lungs is not  excluded, but is not strongly favored on today's examination. Attention on followup studies is recommended to ensure the stability or resolution of these nodules. 3. Subcentimeter low-attenuation lesion in the central aspect of segment 8 of the liver is too small to characterize. This is statistically likely a tiny cyst, but warrants attention on follow-up studies to exclude the possibility of a solitary hepatic metastasis. 4. 1.5 x 1.5 x 1.7 cm well-circumscribed lesion in the proximal stomach. This is of uncertain etiology and significance, and could represent a benign gastric polyp, however, further evaluation with nonemergent endoscopy is suggested in the near future for further evaluation. 5. **An incidental finding of potential clinical significance has been found. 1.1 x 1.6 cm thyroid nodule in the inferior aspect of the right lobe of the thyroid gland. Follow-up evaluation with nonemergent thyroid ultrasound is recommended in the near future to better evaluate this finding. This recommendation follows ACR consensuss guidelines: Managing Incidental Thyroid Nodules Detected on Imaging: White Paper of the ACR Incidental Thyroid Findings Committee. J Am Coll Radiol 2015;12(2):143-150.** 6. Aortic atherosclerosis, in addition to left anterior descending coronary artery disease   10/22/2016 Surgery   Robotic assisted total hysterectomy, BSO and bilateral pelvic lymphadenectomy  Final pathology revealed a 3cm polyp containing serous carcinoma but with no myometrial  invasion, no LVSI and negative nodes.  Stage IA Uterine serous cancer   10/22/2016 Pathology Results   1. Lymph nodes, regional resection, right pelvic - SIX BENIGN LYMPH NODES (0/6). 2. Lymph nodes, regional resection, left pelvic - SEVEN BENIGN LYMPH NODES (0/7). 3. Uterus +/- tubes/ovaries, neoplastic, with right ovary and fallopian tube ENDOMYOMETRIUM - SEROUS CARCINOMA ARISING WITHIN AN ENDOMETRIAL POLYP - NO MYOMETRIAL INVASION  IDENTIFIED - ADENOMYOSIS - LEIOMYOMA (1 CM) - SEE ONCOLOGY TABLE AND COMMENT CERVIX - CARCINOMA FOCALLY INVOLVES ENDOCERVICAL GLANDS - NABOTHIAN CYSTS RIGHT ADNEXA - BENIGN OVARY AND FALLOPIAN TUBE - NO CARCINOMA IDENTIFIED 4. Cul-de-sac biopsy - MESOTHELIAL HYPERPLASIA Microscopic Comment 3. ONCOLOGY TABLE-UTERUS, CARCINOMA OR CARCINOSARCOMA Specimen: Uterus, right fallopian tube and ovary Procedure: Total hysterectomy and right salpingo-oophorectomy Lymph node sampling performed: Bilateral pelvic regional resection Specimen integrity: Intact Maximum tumor size: 3 cm (polyp) Histologic type: Serous carcinoma Grade: High grade Myometrial invasion: Not identified Cervical stromal involvement: No, focal endocervical gland involvement Extent of involvement of other organs: Not identified Lymph - vascular invasion: Not identified Peritoneal washings: N/A Lymph nodes: Examined: 0 Sentinel 13 Non-sentinel 13 Total Lymph nodes with metastasis: 0 Isolated tumor cells (< 0.2 mm): 0 Micrometastasis: (> 0.2 mm and < 2.0 mm): 0 Macrometastasis: (> 2.0 mm): 0 Extracapsular extension: N/A Pelvic lymph nodes: 0 involved of 13 lymph nodes. Para-aortic lymph nodes: No para-aortic nodes submitted TNM code: pT1a, pNX FIGO Stage (based on pathologic findings, needs clinical correlation): IA Comment: Immunohistochemistry for cytokeratin AE1/AE3 is performed on all of the lymph nodes (parts 1 & 2) and no metastatic carcinoma is identified.   07/04/2017 Genetic Testing   The patient had genetic testing due to a personal history of breast and uterine cancer, and a family history of stomach cancer.  The Multi-Cancer Panel was ordered. The Multi-Cancer Panel offered by [facility] includes sequencing and/or deletion duplication testing of the following 83 genes: ALK, APC, ATM, AXIN2,BAP1,  BARD1, BLM, BMPR1A, BRCA1, BRCA2, BRIP1, CASR, CDC73, CDH1, CDK4, CDKN1B, CDKN1C, CDKN2A (p14ARF), CDKN2A  (p16INK4a), CEBPA, CHEK2, CTNNA1, DICER1, DIS3L2, EGFR (c.2369C>T, p.Thr790Met variant only), EPCAM (Deletion/duplication testing only), FH, FLCN, GATA2, GPC3, GREM1 (Promoter region deletion/duplication testing only), HOXB13 (c.251G>A, p.Gly84Glu), HRAS, KIT, MAX, MEN1, MET, MITF (c.952G>A, p.Glu318Lys variant only), MLH1, MSH2, MSH3, MSH6, MUTYH, NBN, NF1, NF2, NTHL1, PALB2, PDGFRA, PHOX2B, PMS2, POLD1, POLE, POT1, PRKAR1A, PTCH1, PTEN, RAD50, RAD51C, RAD51D, RB1, RECQL4, RET, RUNX1, SDHAF2, SDHA (sequence changes only), SDHB, SDHC, SDHD, SMAD4, SMARCA4, SMARCB1, SMARCE1, STK11, SUFU, TERC, TERT, TMEM127, TP53, TSC1, TSC2, VHL, WRN and WT1.   Results: No pathogenic variants were identified.  A variant of uncertain significance in the gene APC was identified.  c.791A>G (p.Gln264Arg).  The date of this test report is 07/04/2017.     Genetic Testing   Patient has genetic testing done for MSI. 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    12/03/2017 Imaging   CT Chest/Abd/Pelvis to follow pulmonary nodule and gastric mass IMPRESSION: 1. Stable CT of the chest. Small pulmonary nodules are unchanged when compared with previous exam. 2. No new findings identified. 3. Subcentimeter low-attenuation lesions within the liver are remain too small to characterize but are stable from prior exam. 4. Persistent indeterminate low-attenuation structure within the proximal stomach is unchanged measuring 1.4 cm. Correlation with direct visualization is advised   06/30/2018 Imaging   CT CHEST Lungs/Pleura: Stable scattered sub-cm pulmonary nodules are again seen bilaterally and are stable compared to previous studies. No new or  enlarging pulmonary nodules or masses identified. No evidence of pulmonary infiltrate or pleural effusion.   08/19/2018 Echocardiogram   [place] is done in [place]: EF 60-65%. Mild impaired relaxation. (report scanned)   09/17/2018 Relapse/Recurrence   Presented  with c/o vagina discharge.  Lesion noted at the left vaginal apex 6mm lesion removed Path c/w high grade serous cancer   09/17/2018 Pathology Results   Vagina, biopsy, left apex - HIGH GRADE SEROUS CARCINOMA.   09/28/2018 PET scan   1. Two hypermetabolic axial lymph nodes. Unusual site for metastatic endometrial carcinoma however the activity is more intense than typically seen in reactive adenopathy. Suggest ultrasound-guided percutaneous biopsy of the larger RIGHT axial lymph node 2. No evidence of local recurrence at the vaginal cuff.  3. No metastatic adenopathy in the abdomen or pelvis. 4. Stable small pulmonary nodules   10/09/2018 Pathology Results   Lymph node, needle/core biopsy, right axilla - METASTATIC CARCINOMA, SEE COMMENT. Microscopic Comment The carcinoma appears high grade. Immunohistochemistry is positive for cytokeratin 7, PAX-8, and ER. Cytokeratin 20, CDX-2, PR, and GATA-3 are negative. The findings along with the history are consistent with a gynecologic primary.    10/09/2018 Procedure   Ultrasound-guided core biopsies of a right axillary lymph node.   10/14/2018 Cancer Staging   Staging form: Corpus Uteri - Carcinoma and Carcinosarcoma, AJCC 8th Edition - Clinical: Stage IVB (cT1, cN0, pM1) - Signed by Artis Delay, MD on 10/14/2018   10/19/2018 Imaging   Placement of single lumen port a cath via right internal jugular vein. The catheter tip lies at the cavo-atrial junction. A power injectable port a cath was placed and is ready for immediate use.    10/21/2018 -  Chemotherapy   The patient had carboplatin and taxol for treatment   11/11/2018 - 03/12/2022 Chemotherapy   Patient is on Treatment Plan : BREAST Trastuzumab q21d     12/21/2018 PET scan   1. Interval resolution of hypermetabolic right axillary lymph nodes. No metabolic findings highly suspicious for recurrent metastatic disease. 2. New mild hypermetabolism within a borderline prominent portacaval lymph node,  nonspecific. While a reactive node is favored, a lymph node metastasis cannot be entirely excluded. Suggest attention to this lymph node on follow-up PET-CT in 3-6 months. 3. Nonspecific new hypermetabolism at the ileocecal valve, more likely physiologic given absence of CT correlate. 4. Scattered subcentimeter pulmonary nodules are all stable and below PET resolution, more likely benign, continued CT surveillance advised. 5. Chronic findings include: Aortic Atherosclerosis (ICD10-I70.0). Marked diffuse colonic diverticulosis. Coronary atherosclerosis.     12/28/2018 Echocardiogram   1. The left ventricle has normal systolic function with an ejection fraction of 60-65%. The cavity size was normal. Left ventricular diastolic Doppler parameters are consistent with impaired relaxation.  2. GLS recorded as -10.7 but LV appears hyperdynamic and tracking of endocardium appears poor.  3. The right ventricle has normal systolic function. The cavity was normal. There is no increase in right ventricular wall thickness.  4. Mild thickening of the mitral valve leaflet.  5. The aortic valve was not well visualized. Mild thickening of the aortic valve. Aortic valve regurgitation is trivial by color flow Doppler.   03/23/2019 Imaging   PET 1. No findings of hypermetabolic residual/recurrent or metastatic disease. 2. Similar low-level hypermetabolism within normal sized portocaval and right inguinal nodes, favored to be reactive. 3. Ongoing stability of small bilateral pulmonary nodules, favored to be benign. Below PET resolution. 4. Coronary artery atherosclerosis. Aortic Atherosclerosis   03/26/2019 Echocardiogram  1. The left ventricle has normal systolic function, with an ejection fraction of 55-60%. The cavity size was normal. Left ventricular diastolic Doppler parameters are consistent with impaired relaxation.  2. The right ventricle has normal systolic function. The cavity was normal.  3. The mitral  valve is abnormal. Mild thickening of the mitral valve leaflet. There is mild mitral annular calcification present.  4. The tricuspid valve is grossly normal.  5. The aortic valve is tricuspid. Mild calcification of the aortic valve. No stenosis of the aortic valve.  6. The aorta is normal unless otherwise noted.  7. Normal LV systolic function; grade 1 diastolic dysfunction; GLS-15.2%.   06/30/2019 Imaging   Chest Impression:   1. No evidence thoracic metastasis. 2. Stable small benign-appearing pulmonary nodules   Abdomen / Pelvis Impression:   1. No evidence of metastatic disease in the abdomen pelvis. 2.  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 3.  Aortic Atherosclerosis (ICD10-I70.0).   06/30/2019 Echocardiogram    1. Left ventricular ejection fraction, by visual estimation, is 60 to 65%. The left ventricle has normal function. There is no left ventricular hypertrophy.  2. Left ventricular diastolic parameters are consistent with Grade I diastolic dysfunction (impaired relaxation).  3. The left ventricle has no regional wall motion abnormalities.  4. Global right ventricle has normal systolic function.The right ventricular size is normal. No increase in right ventricular wall thickness.  5. Left atrial size was mild-moderately dilated.  6. Right atrial size was normal.  7. The mitral valve is normal in structure. Trivial mitral valve regurgitation.  8. The tricuspid valve is normal in structure. Tricuspid valve regurgitation is trivial.  9. The aortic valve is normal in structure. Aortic valve regurgitation is trivial. No evidence of aortic valve sclerosis or stenosis. 10. The pulmonic valve was grossly normal. Pulmonic valve regurgitation is not visualized. 11. Mildly elevated pulmonary artery systolic pressure. 12. The inferior vena cava is normal in size with greater than 50% respiratory variability, suggesting right atrial pressure of 3 mmHg. 13. The average  left ventricular global longitudinal strain is -12.5 %. GLS underestimated due to poor endocardial tracking.     09/27/2019 Imaging   1. Multiple small, nonspecific pulmonary nodules are again noted. The previously noted lung nodules are unchanged in size from previous exam. There is a new lung nodule within the medial right lower lobe which is nonspecific measure 4 mm. Attention at follow-up imaging is recommended. 2. No evidence of metastatic disease within the abdomen or pelvis. 3. Coronary artery calcifications.  The 4.  Aortic Atherosclerosis (ICD10-I70.0).   09/30/2019 Echocardiogram    1. Left ventricular ejection fraction, by estimation, is 60 to 65%. The left ventricle has normal function. The left ventricle has no regional wall motion abnormalities. Left ventricular diastolic parameters are consistent with Grade I diastolic dysfunction (impaired relaxation).  2. Right ventricular systolic function is normal. The right ventricular size is normal. There is normal pulmonary artery systolic pressure. The estimated right ventricular systolic pressure is 24.2 mmHg.  3. The mitral valve is normal in structure. No evidence of mitral valve regurgitation. No evidence of mitral stenosis.  4. The aortic valve is normal in structure. Aortic valve regurgitation is trivial. No aortic stenosis is present.  5. The inferior vena cava is normal in size with greater than 50% respiratory variability, suggesting right atrial pressure of 3 mmHg.     01/07/2020 Echocardiogram    1. Normal LV function; grade 1 diastolic dysfunction; mild AI; GLS -  21%.  2. Left ventricular ejection fraction, by estimation, is 55 to 60%. The left ventricle has normal function. The left ventricle has no regional wall motion abnormalities. Left ventricular diastolic parameters are consistent with Grade I diastolic dysfunction (impaired relaxation).  3. Right ventricular systolic function is normal. The right ventricular size is  normal.  4. The mitral valve is normal in structure. Trivial mitral valve regurgitation. No evidence of mitral stenosis.  5. The aortic valve is tricuspid. Aortic valve regurgitation is mild. Mild aortic valve sclerosis is present, with no evidence of aortic valve stenosis.  6. The inferior vena cava is normal in size with greater than 50% respiratory variability, suggesting right atrial pressure of 3 mmHg.     03/27/2020 Imaging   1. Status post hysterectomy. No evidence of recurrent or metastatic disease in the abdomen or pelvis. 2. Multiple small pulmonary nodules in the lung bases are unchanged to the extent that they are included on current examination and remain most likely sequelae of prior infection or inflammation. Attention on follow-up. 3. Aortic Atherosclerosis (ICD10-I70.0).   04/17/2020 Echocardiogram   1. Normal LVEF, normal and unchanged GLS: -20.2%.  2. Left ventricular ejection fraction, by estimation, is 60 to 65%. The left ventricle has normal function. The left ventricle has no regional wall motion abnormalities. There is mild concentric left ventricular hypertrophy. Left ventricular diastolic parameters are consistent with Grade I diastolic dysfunction (impaired relaxation). The average left ventricular global longitudinal strain is -20.2 %. The global longitudinal strain is normal.  3. Right ventricular systolic function is normal. The right ventricular size is normal.  4. The mitral valve is normal in structure. Mild mitral valve regurgitation. No evidence of mitral stenosis.  5. The aortic valve is normal in structure. There is mild calcification of the aortic valve. There is mild thickening of the aortic valve. Aortic valve regurgitation is mild. No aortic stenosis is present.  6. The inferior vena cava is normal in size with greater than 50% respiratory variability, suggesting right atrial pressure of 3 mmHg.   08/02/2020 Echocardiogram   1. Left ventricular ejection  fraction, by estimation, is 55 to 60%. The left ventricle has normal function. The left ventricle has no regional wall motion abnormalities. Left ventricular diastolic parameters are consistent with Grade I diastolic dysfunction (impaired relaxation). The average left ventricular global longitudinal strain is -27.0 %. The global longitudinal strain is normal.  2. Right ventricular systolic function is normal. The right ventricular size is normal. Tricuspid regurgitation signal is inadequate for assessing PA pressure.  3. The mitral valve is grossly normal. Trivial mitral valve regurgitation. No evidence of mitral stenosis.  4. The aortic valve is tricuspid. Aortic valve regurgitation is mild. No aortic stenosis is present.  5. The inferior vena cava is normal in size with greater than 50% respiratory variability, suggesting right atrial pressure of 3 mmHg.   09/19/2020 Imaging   1. Status post hysterectomy. No evidence of recurrent or new metastatic disease within the chest, abdomen, or pelvis. 2. Scattered bilateral tiny pulmonary nodules, unchanged from prior studies and most likely sequela of prior infection or inflammation, recommend attention on follow-up imaging. No new suspicious pulmonary nodules or masses. 3. Subcutaneous edema and small hematoma overlying the posterior gluteal musculature, recommend correlation with recent history of trauma. 4. Mild low-density wall thickening of the gastric antrum, which may represent gastritis. 5. Hepatic steatosis. 6. Extensive sigmoid colonic diverticulosis without findings of acute diverticulitis. 7. Aortic atherosclerosis.  Aortic Atherosclerosis (ICD10-I70.0).  11/20/2020 Echocardiogram    1. Compared to echo from Jan 2022, Global longitudinal strain is less negative. (Previously -27%). Left ventricular ejection fraction, by estimation, is 60 to 65%. The left ventricle has normal function. The left ventricle has no regional wall motion abnormalities.  There is mild left ventricular hypertrophy. Left ventricular diastolic parameters are indeterminate.  2. Right ventricular systolic function is normal. The right ventricular size is normal.  3. The mitral valve is normal in structure. Trivial mitral valve regurgitation.  4. Aortic valve regurgitation is mild. Mild aortic valve sclerosis is present, with no evidence of aortic valve stenosis.     03/12/2021 Echocardiogram   1. Left ventricular ejection fraction, by estimation, is 60 to 65%. Left ventricular ejection fraction by 2D MOD biplane is 63.6 %. The left ventricle has normal function. The left ventricle has no regional wall motion abnormalities. There is mild left ventricular hypertrophy. Left ventricular diastolic parameters are consistent with Grade I diastolic dysfunction (impaired relaxation). The average left ventricular global longitudinal strain is -20.0 %. The global longitudinal strain is normal.  2. Right ventricular systolic function is normal. The right ventricular size is normal. There is normal pulmonary artery systolic pressure. The estimated right ventricular systolic pressure is 20.0 mmHg.  3. The mitral valve is normal in structure. No evidence of mitral valve regurgitation. No evidence of mitral stenosis.  4. The aortic valve is tricuspid. Aortic valve regurgitation is trivial. No aortic stenosis is present. Aortic regurgitation PHT measures 403 msec.  5. The inferior vena cava is normal in size with greater than 50% respiratory variability, suggesting right atrial pressure of 3 mmHg.   04/02/2021 Imaging   Stable small pulmonary nodules scattered throughout the chest, unchanged, without new or suspicious pulmonary nodule.   Three-vessel coronary artery disease.   Aortic Atherosclerosis (ICD10-I70.0).   06/06/2021 Imaging    1. Left ventricular ejection fraction, by estimation, is 60 to 65%. The left ventricle has normal function. The left ventricle has no regional wall  motion abnormalities. Left ventricular diastolic parameters are consistent with Grade I diastolic dysfunction (impaired relaxation).  2. Right ventricular systolic function is normal. The right ventricular size is normal. There is normal pulmonary artery systolic pressure. The estimated right ventricular systolic pressure is 28.6 mmHg.  3. Left atrial size was mildly dilated.  4. The mitral valve is normal in structure. Trivial mitral valve regurgitation.  5. The aortic valve is tricuspid. Aortic valve regurgitation is mild. No aortic stenosis is present.  6. The inferior vena cava is normal in size with greater than 50% respiratory variability, suggesting right atrial pressure of 3 mmHg.  7. GLS attempted but not reported due to suboptimal tracking.     09/25/2021 Echocardiogram    1. Left ventricular ejection fraction, by estimation, is 60 to 65%. Left ventricular ejection fraction by 3D volume is 56 %. The left ventricle has normal function. The left ventricle has no regional wall motion abnormalities. Left ventricular diastolic parameters were normal.  2. Right ventricular systolic function is normal. The right ventricular size is normal. There is normal pulmonary artery systolic pressure.  3. The mitral valve is normal in structure. Trivial mitral valve regurgitation. No evidence of mitral stenosis.  4. The aortic valve is tricuspid. Aortic valve regurgitation is mild. No aortic stenosis is present.  5. The inferior vena cava is normal in size with greater than 50% respiratory variability, suggesting right atrial pressure of 3 mmHg.   10/15/2021 Imaging   1. Stable  examination post prior hysterectomy without evidence of local recurrence or metastatic disease within the chest, abdomen, or pelvis. 2. Unchanged small bilateral pulmonary nodules, likely benign. 3. Left-sided colonic diverticulosis without findings of acute diverticulitis. 4. Aortic Atherosclerosis (ICD10-I70.0).       12/31/2021  Echocardiogram    1. Left ventricular ejection fraction, by estimation, is 65 to 70%. The left ventricle has normal function. The left ventricle has no regional wall motion abnormalities. Left ventricular diastolic parameters are consistent with Grade I diastolic dysfunction (impaired relaxation).  2. Right ventricular systolic function is normal. The right ventricular size is normal. There is normal pulmonary artery systolic pressure. The estimated right ventricular systolic pressure is 27.4 mmHg.  3. The mitral valve is grossly normal. Trivial mitral valve regurgitation. No evidence of mitral stenosis.  4. The aortic valve is tricuspid. Aortic valve regurgitation is mild. No aortic stenosis is present.  5. The inferior vena cava is normal in size with greater than 50% respiratory variability, suggesting right atrial pressure of 3 mmHg.     04/02/2022 -  Chemotherapy   Patient is on Treatment Plan : BREAST MAINTENANCE Trastuzumab IV (6) or SQ (600) D1 q21d X 11 Cycles     04/04/2022 Echocardiogram    1. Left ventricular ejection fraction, by estimation, is 65 to 70%. The left ventricle has normal function. The left ventricle has no regional wall motion abnormalities. Left ventricular diastolic parameters are consistent with Grade I diastolic dysfunction (impaired relaxation).  2. Right ventricular systolic function is normal. The right ventricular size is normal.  3. Left atrial size was mildly dilated.  4. The mitral valve is normal in structure. Trivial mitral valve regurgitation. No evidence of mitral stenosis.  5. The aortic valve is tricuspid. There is mild calcification of the aortic valve. Aortic valve regurgitation is mild. No aortic stenosis is present. Aortic regurgitation PHT measures 613 msec. Aortic valve area, by VTI measures 1.70 cm. Aortic valve mean gradient measures 3.0 mmHg. Aortic valve Vmax measures 1.15 m/s.  6. The inferior vena cava is normal in size with greater than 50%  respiratory variability, suggesting right atrial pressure of 3 mmHg.   07/02/2022 Echocardiogram   1. Left ventricular ejection fraction, by estimation, is 60 to 65%. Left ventricular ejection fraction by 3D volume is 58 %. The left ventricle has normal function. The left ventricle has no regional wall motion abnormalities. Left ventricular diastolic parameters are consistent with Grade I diastolic dysfunction (impaired relaxation). The average left ventricular global longitudinal strain is -20.7 %. The global longitudinal strain is normal.  2. Right ventricular systolic function is normal. The right ventricular size is normal. Tricuspid regurgitation signal is inadequate for assessing PA pressure.  3. Left atrial size was mildly dilated.  4. The mitral valve is normal in structure. Trivial mitral valve regurgitation. No evidence of mitral stenosis.  5. The aortic valve is grossly normal. Aortic valve regurgitation is mild. No aortic stenosis is present.  6. The inferior vena cava is normal in size with greater than 50% respiratory variability, suggesting right atrial pressure of 3 mmHg.   11/05/2022 Echocardiogram       1. Left ventricular ejection fraction, by estimation, is 55 to 60%. Left ventricular ejection fraction by 3D volume is 59 %. The left ventricle has normal function. The left ventricle has no regional wall motion abnormalities. Left ventricular diastolic  parameters are consistent with Grade I diastolic dysfunction (impaired relaxation). The average left ventricular global longitudinal strain is -16.8 %.  The global longitudinal strain is normal.  2. Right ventricular systolic function is normal. The right ventricular size is normal. There is normal pulmonary artery systolic pressure.  3. The mitral valve is normal in structure. No evidence of mitral valve regurgitation. No evidence of mitral stenosis.  4. The aortic valve is normal in structure. Aortic valve regurgitation is mild. No  aortic stenosis is present.  5. The inferior vena cava is normal in size with greater than 50% respiratory variability, suggesting right atrial pressure of 3 mmHg.     11/07/2022 Imaging   CT CHEST ABDOMEN PELVIS W CONTRAST  Result Date: 11/07/2022 CLINICAL DATA:  Cervical cancer. Assess treatment response. On chemotherapy * Tracking Code: BO * EXAM: CT CHEST, ABDOMEN, AND PELVIS WITH CONTRAST TECHNIQUE: Multidetector CT imaging of the chest, abdomen and pelvis was performed following the standard protocol during bolus administration of intravenous contrast. RADIATION DOSE REDUCTION: This exam was performed according to the departmental dose-optimization program which includes automated exposure control, adjustment of the mA and/or kV according to patient size and/or use of iterative reconstruction technique. CONTRAST:  OMNIPAQUE IOHEXOL 300 MG/ML  SOLN COMPARISON:  CT 10/15/2021 and older FINDINGS: CT CHEST FINDINGS Cardiovascular: Right upper chest port. Heart is nonenlarged. No pericardial effusion. The thoracic aorta has a normal course and caliber with scattered vascular calcifications. There is a bovine type aortic arch, a normal variant. Mediastinum/Nodes: Heterogeneous small thyroid gland with a nodular area in the right is unchanged from previous. As stated previously, no specific imaging follow-up. No specific abnormal lymph node enlargement seen in the axillary regions, hilum or mediastinum. Surgical clips in the left axillary region. Breast implants are noted. Normal caliber thoracic esophagus. Lungs/Pleura: There is some linear opacity lung bases likely scar or atelectasis. No pleural effusion. No consolidation or pneumothorax. There are a few scattered tiny lung nodules identified. Example 3 mm superior segment left lower lobe on series 6, image 49 which is stable. Lingular nodule measuring 4 mm on the prior, today is stable on series 6, image 73. 3 mm nodule along the margin of the minor  fissure on the right is also stable. There is a larger ill-defined areas seen right lung on the prior examination series 4, image 63 which has resolved. This may have been infectious or inflammatory. There is a small area of bronchiectasis in this location today on series 6, image 58. No new dominant lung nodule. Musculoskeletal: Diffuse moderate degenerative changes of the spine. CT ABDOMEN PELVIS FINDINGS Hepatobiliary: Fatty liver infiltration. There are some tiny low-attenuation lesion seen in the liver which are too small to completely characterize but unchanged from previous. Simple attention on follow-up for the patient's neoplasm. Example series 2, image 44 anterior to branches of the right hepatic vein. Gallbladder is nondilated. Patent portal vein. Pancreas: Unremarkable. No pancreatic ductal dilatation or surrounding inflammatory changes. Spleen: Normal in size without focal abnormality. Adrenals/Urinary Tract: The adrenal glands are preserved. No enhancing renal mass. There are some tiny low-attenuation lesion seen along each kidney which are too small to completely characterize likely small cysts. No specific imaging follow-up. Bosniak 2 lesions. Contracted urinary bladder. Stomach/Bowel: Large bowel has extensive left-sided colonic stool. Extensive colonic diverticulosis as well. The large bowel is nondilated. Small bowel is nondilated. There is a nodular area along the upper aspect of the stomach fold thickening. On series 2, image 45 this measures 18 by 14 mm. This is not well seen previously but on the prior study the stomach is  more collapsed. There may have been something in retrospect in this location. There also is some nodular areas along the antrum. Recommend further evaluation to exclude underlying lesion. Vascular/Lymphatic: Normal caliber aorta and IVC with scattered vascular calcifications. No specific abnormal lymph node enlargement identified in the abdomen and pelvis. Reproductive:  Status post hysterectomy. No adnexal masses. Other: No free air or free fluid. Musculoskeletal: Moderate degenerative changes of the spine and pelvis. Trace anterolisthesis at L4-5 and L5-S1. 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 IMPRESSION: Stable tiny multiple pulmonary nodules. 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 No developing lymph node enlargement or fluid collection. Colonic diverticulosis of the sigmoid colon with moderate stool. There are nodular masslike areas seen in the stomach including towards the fundus and small area towards the antrum with fold thickening. Hyperplastic polyps are possible. Recommend further workup of the stomach when appropriate to exclude an aggressive process. Electronically Signed   By: Karen Kays M.D.   On: 11/07/2022 13:39   ECHOCARDIOGRAM COMPLETE  Result Date: 11/05/2022    ECHOCARDIOGRAM REPORT   Patient Name:   Vicki Cervantes Date of Exam: 11/05/2022 Medical Rec #:  [id number]      Height:       62.0 in Accession #:    [id number]     Weight:       134.8 lb Date of Birth:  1940-12-23      BSA:          1.617 m Patient Age:    81 years       BP:           144/72 mmHg Patient Gender: F              HR:           69 bpm. Exam Location:  Outpatient Procedure: 2D Echo, 3D Echo, Cardiac Doppler, Color Doppler and Strain Analysis Indications:    chemo  History:        Patient has prior history of Echocardiogram examinations. Risk                 Factors:Hypertension.  Sonographer:    Mike Gip Referring Phys: [id number] Meridith Romick IMPRESSIONS  1. Left ventricular ejection fraction, by estimation, is 55 to 60%. Left ventricular ejection fraction by 3D volume is 59 %. The left ventricle has normal function. The left ventricle has no regional wall motion abnormalities. Left ventricular diastolic  parameters are consistent with Grade I diastolic dysfunction (impaired relaxation). The average left ventricular global longitudinal strain is -16.8 %.  The global longitudinal strain is normal.  2. Right ventricular systolic function is normal. The right ventricular size is normal. There is normal pulmonary artery systolic pressure.  3. The mitral valve is normal in structure. No evidence of mitral valve regurgitation. No evidence of mitral stenosis.  4. The aortic valve is normal in structure. Aortic valve regurgitation is mild. No aortic stenosis is present.  5. The inferior vena cava is normal in size with greater than 50% respiratory variability, suggesting right atrial pressure of 3 mmHg. Comparison(s): Prior 07/01/2022: EF 60%, GLS -20.7% Prior 03/2022: GLS-15.3. FINDINGS  Left Ventricle: Left ventricular ejection fraction, by estimation, is 55 to 60%. Left ventricular ejection fraction by 3D volume is 59 %. The left ventricle has normal function. The left ventricle has no regional wall motion abnormalities. The average left ventricular global longitudinal strain is -16.8 %. The global longitudinal strain is normal. The left ventricular internal  cavity size was normal in size. There is no left ventricular hypertrophy. Left ventricular diastolic parameters are consistent  with Grade I diastolic dysfunction (impaired relaxation). Right Ventricle: The right ventricular size is normal. No increase in right ventricular wall thickness. Right ventricular systolic function is normal. There is normal pulmonary artery systolic pressure. The tricuspid regurgitant velocity is 1.79 m/s, and  with an assumed right atrial pressure of 3 mmHg, the estimated right ventricular systolic pressure is 15.8 mmHg. Left Atrium: Left atrial size was normal in size. Right Atrium: Right atrial size was normal in size. Pericardium: There is no evidence of pericardial effusion. Mitral Valve: The mitral valve is normal in structure. No evidence of mitral valve regurgitation. No evidence of mitral valve stenosis. Tricuspid Valve: The tricuspid valve is normal in structure. Tricuspid valve  regurgitation is not demonstrated. No evidence of tricuspid stenosis. Aortic Valve: The aortic valve is normal in structure. Aortic valve regurgitation is mild. Aortic regurgitation PHT measures 497 msec. No aortic stenosis is present. Pulmonic Valve: The pulmonic valve was normal in structure. Pulmonic valve regurgitation is mild. No evidence of pulmonic stenosis. Aorta: The aortic root is normal in size and structure. Venous: The inferior vena cava is normal in size with greater than 50% respiratory variability, suggesting right atrial pressure of 3 mmHg. IAS/Shunts: No atrial level shunt detected by color flow Doppler.  LEFT VENTRICLE PLAX 2D LVIDd:         3.80 cm         Diastology LVIDs:         2.50 cm         LV e' medial:    7.51 cm/s LV PW:         1.00 cm         LV E/e' medial:  7.0 LV IVS:        1.00 cm         LV e' lateral:   8.16 cm/s LVOT diam:     1.90 cm         LV E/e' lateral: 6.4 LV SV:         49 LV SV Index:   30              2D LVOT Area:     2.84 cm        Longitudinal                                Strain                                2D Strain GLS  -16.8 % LV Volumes (MOD)               Avg: LV vol d, MOD    62.3 ml A2C:                           3D Volume EF LV vol d, MOD    68.5 ml       LV 3D EF:    Left A4C:                                        ventricul LV vol s, MOD  25.6 ml                    ar A2C:                                        ejection LV vol s, MOD    24.1 ml                    fraction A4C:                                        by 3D LV SV MOD A2C:   36.7 ml                    volume is LV SV MOD A4C:   68.5 ml                    59 %. LV SV MOD BP:    39.9 ml                                 3D Volume EF:                                3D EF:        59 %                                LV EDV:       116 ml                                LV ESV:       48 ml                                LV SV:        68 ml RIGHT VENTRICLE             IVC RV Basal diam:  3.00 cm      IVC diam: 1.40 cm RV S prime:     10.20 cm/s TAPSE (M-mode): 1.9 cm LEFT ATRIUM             Index        RIGHT ATRIUM           Index LA diam:        3.40 cm 2.10 cm/m   RA Area:     11.20 cm LA Vol (A2C):   49.4 ml 30.56 ml/m  RA Volume:   22.50 ml  13.92 ml/m LA Vol (A4C):   28.3 ml 17.51 ml/m LA Biplane Vol: 38.6 ml 23.88 ml/m  AORTIC VALVE LVOT Vmax:   79.80 cm/s LVOT Vmean:  49.400 cm/s LVOT VTI:    0.173 m AI PHT:      497 msec  AORTA Ao Root diam: 3.20 cm MITRAL VALVE               TRICUSPID VALVE MV Area (PHT): 2.84 cm  TR Peak grad:   12.8 mmHg MV Decel Time: 267 msec    TR Vmax:        179.00 cm/s MV E velocity: 52.40 cm/s MV A velocity: 69.80 cm/s  SHUNTS MV E/A ratio:  0.75        Systemic VTI:  0.17 m                            Systemic Diam: 1.90 cm Donato Schultz MD Electronically signed by Donato Schultz MD Signature Date/Time: 11/05/2022/2:11:35 PM    Final       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/14/2018 Initial Diagnosis   Metastasis to lymph nodes (HCC)   10/21/2018 - 02/24/2019 Chemotherapy   The patient had palonosetron (ALOXI) injection 0.25 mg, 0.25 mg, Intravenous,  Once, 7 of 7 cycles Administration: 0.25 mg (10/21/2018), 0.25 mg (11/11/2018), 0.25 mg (12/02/2018), 0.25 mg (12/23/2018), 0.25 mg (01/13/2019), 0.25 mg (02/03/2019), 0.25 mg (02/24/2019) CARBOplatin (PARAPLATIN) 310 mg in sodium chloride 0.9 % 250 mL chemo infusion, 310 mg, Intravenous,  Once, 7 of 7 cycles Dose modification: 300 mg (original dose 311.5 mg, Cycle 4, Reason: Dose not tolerated) Administration: 310 mg (10/21/2018), 300 mg (11/11/2018), 300 mg (12/02/2018), 300 mg (12/23/2018), 300 mg (01/13/2019), 300 mg (02/03/2019), 300 mg (02/24/2019) PACLitaxel (TAXOL) 222 mg in sodium chloride 0.9 % 250 mL chemo infusion (> 80mg /m2), 135 mg/m2 = 222 mg, Intravenous,  Once, 7 of 7 cycles Administration: 222 mg (10/21/2018), 222 mg (11/11/2018), 222 mg (12/02/2018), 222 mg (12/23/2018), 222 mg (01/13/2019), 222 mg (02/03/2019), 222 mg  (02/24/2019) fosaprepitant (EMEND) 150 mg, dexamethasone (DECADRON) 12 mg in sodium chloride 0.9 % 145 mL IVPB, , Intravenous,  Once, 7 of 7 cycles Administration:  (10/21/2018),  (11/11/2018),  (12/02/2018),  (12/23/2018),  (01/13/2019),  (02/03/2019),  (02/24/2019)  for chemotherapy treatment.    04/02/2022 -  Chemotherapy   Patient is on Treatment Plan : BREAST MAINTENANCE Trastuzumab IV (6) or SQ (600) D1 q21d X 11 Cycles       PHYSICAL EXAMINATION: ECOG PERFORMANCE STATUS: 0 - Asymptomatic  Vitals:   01/28/23 0943  BP: (!) 143/77  Pulse: 70  Resp: 18  SpO2: 95%   Filed Weights   01/28/23 0943  Weight: 137 lb 6.4 oz (62.3 kg)    GENERAL:alert, no distress and comfortable NEURO: alert & oriented x 3 with fluent speech, no focal motor/sensory deficits  LABORATORY DATA:  I have reviewed the data as listed    Component Value Date/Time   NA 136 01/07/2023 0821   NA 139 02/13/2017 1512   K 3.7 01/07/2023 0821   K 4.2 02/13/2017 1512   CL 100 01/07/2023 0821   CO2 27 01/07/2023 0821   CO2 28 02/13/2017 1512   GLUCOSE 104 (H) 01/07/2023 0821   GLUCOSE 118 02/13/2017 1512   BUN 23 01/07/2023 0821   BUN 25.2 02/13/2017 1512   CREATININE 1.30 (H) 01/07/2023 0821   CREATININE 1.20 (H) 11/05/2022 1215   CREATININE 1.2 (H) 02/13/2017 1512   CALCIUM 9.0 01/07/2023 0821   CALCIUM 10.0 02/13/2017 1512   PROT 6.9 01/07/2023 0821   ALBUMIN 4.0 01/07/2023 0821   AST 20 01/07/2023 0821   AST 19 11/05/2022 1215   ALT 13 01/07/2023 0821   ALT 12 11/05/2022 1215   ALKPHOS 60 01/07/2023 0821   BILITOT 0.4 01/07/2023 0821   BILITOT 0.4 11/05/2022 1215   GFRNONAA 41 (L) 01/07/2023 1191  GFRNONAA 45 (L) 11/05/2022 1215   GFRAA 47 (L) 03/28/2020 0933   GFRAA 42 (L) 12/21/2019 0850    No results found for: "SPEP", "UPEP"  Lab Results  Component Value Date   WBC 6.0 01/07/2023   NEUTROABS 3.6 01/07/2023   HGB 10.7 (L) 01/07/2023   HCT 31.0 (L) 01/07/2023   MCV 91.2 01/07/2023   PLT  233 01/07/2023      Chemistry      Component Value Date/Time   NA 136 01/07/2023 0821   NA 139 02/13/2017 1512   K 3.7 01/07/2023 0821   K 4.2 02/13/2017 1512   CL 100 01/07/2023 0821   CO2 27 01/07/2023 0821   CO2 28 02/13/2017 1512   BUN 23 01/07/2023 0821   BUN 25.2 02/13/2017 1512   CREATININE 1.30 (H) 01/07/2023 0821   CREATININE 1.20 (H) 11/05/2022 1215   CREATININE 1.2 (H) 02/13/2017 1512      Component Value Date/Time   CALCIUM 9.0 01/07/2023 0821   CALCIUM 10.0 02/13/2017 1512   ALKPHOS 60 01/07/2023 0821   AST 20 01/07/2023 0821   AST 19 11/05/2022 1215   ALT 13 01/07/2023 0821   ALT 12 11/05/2022 1215   BILITOT 0.4 01/07/2023 0821   BILITOT 0.4 11/05/2022 1215

## 2023-01-28 NOTE — Patient Instructions (Signed)

## 2023-01-29 ENCOUNTER — Other Ambulatory Visit: Payer: Self-pay

## 2023-02-12 DIAGNOSIS — E039 Hypothyroidism, unspecified: Secondary | ICD-10-CM | POA: Diagnosis not present

## 2023-02-12 DIAGNOSIS — I1310 Hypertensive heart and chronic kidney disease without heart failure, with stage 1 through stage 4 chronic kidney disease, or unspecified chronic kidney disease: Secondary | ICD-10-CM | POA: Diagnosis not present

## 2023-02-12 DIAGNOSIS — N1832 Chronic kidney disease, stage 3b: Secondary | ICD-10-CM | POA: Diagnosis not present

## 2023-02-12 DIAGNOSIS — I700 Atherosclerosis of aorta: Secondary | ICD-10-CM | POA: Diagnosis not present

## 2023-02-13 DIAGNOSIS — H35372 Puckering of macula, left eye: Secondary | ICD-10-CM | POA: Diagnosis not present

## 2023-02-13 DIAGNOSIS — H338 Other retinal detachments: Secondary | ICD-10-CM | POA: Diagnosis not present

## 2023-02-13 DIAGNOSIS — H35453 Secondary pigmentary degeneration, bilateral: Secondary | ICD-10-CM | POA: Diagnosis not present

## 2023-02-13 DIAGNOSIS — H353131 Nonexudative age-related macular degeneration, bilateral, early dry stage: Secondary | ICD-10-CM | POA: Diagnosis not present

## 2023-02-13 DIAGNOSIS — H35363 Drusen (degenerative) of macula, bilateral: Secondary | ICD-10-CM | POA: Diagnosis not present

## 2023-02-13 DIAGNOSIS — Z961 Presence of intraocular lens: Secondary | ICD-10-CM | POA: Diagnosis not present

## 2023-02-17 ENCOUNTER — Ambulatory Visit (HOSPITAL_COMMUNITY)
Admission: RE | Admit: 2023-02-17 | Discharge: 2023-02-17 | Disposition: A | Discharge status: Home / Self Care | Payer: Medicare HMO | Source: Ambulatory Visit | Attending: Hematology and Oncology | Admitting: Hematology and Oncology

## 2023-02-17 DIAGNOSIS — I10 Essential (primary) hypertension: Secondary | ICD-10-CM | POA: Insufficient documentation

## 2023-02-17 DIAGNOSIS — C541 Malignant neoplasm of endometrium: Secondary | ICD-10-CM | POA: Diagnosis not present

## 2023-02-17 LAB — ECHOCARDIOGRAM COMPLETE
Area-P 1/2: 4.57 cm2
S' Lateral: 3.1 cm

## 2023-02-17 NOTE — Progress Notes (Signed)
  Echocardiogram 2D Echocardiogram has been performed.  Leda Roys RDCS 02/17/2023, 10:05 AM

## 2023-02-18 ENCOUNTER — Inpatient Hospital Stay: Payer: Medicare HMO | Attending: Gynecologic Oncology

## 2023-02-18 ENCOUNTER — Other Ambulatory Visit: Payer: Self-pay

## 2023-02-18 ENCOUNTER — Inpatient Hospital Stay: Payer: Medicare HMO

## 2023-02-18 VITALS — BP 156/75 | HR 67 | Temp 97.8°F | Resp 16 | Wt 135.8 lb

## 2023-02-18 DIAGNOSIS — Z7189 Other specified counseling: Secondary | ICD-10-CM

## 2023-02-18 DIAGNOSIS — Z79899 Other long term (current) drug therapy: Secondary | ICD-10-CM | POA: Diagnosis not present

## 2023-02-18 DIAGNOSIS — Z7982 Long term (current) use of aspirin: Secondary | ICD-10-CM | POA: Diagnosis not present

## 2023-02-18 DIAGNOSIS — Z9079 Acquired absence of other genital organ(s): Secondary | ICD-10-CM | POA: Insufficient documentation

## 2023-02-18 DIAGNOSIS — Z90710 Acquired absence of both cervix and uterus: Secondary | ICD-10-CM | POA: Insufficient documentation

## 2023-02-18 DIAGNOSIS — I1310 Hypertensive heart and chronic kidney disease without heart failure, with stage 1 through stage 4 chronic kidney disease, or unspecified chronic kidney disease: Secondary | ICD-10-CM | POA: Diagnosis not present

## 2023-02-18 DIAGNOSIS — Z90722 Acquired absence of ovaries, bilateral: Secondary | ICD-10-CM | POA: Diagnosis not present

## 2023-02-18 DIAGNOSIS — N183 Chronic kidney disease, stage 3 unspecified: Secondary | ICD-10-CM | POA: Insufficient documentation

## 2023-02-18 DIAGNOSIS — C541 Malignant neoplasm of endometrium: Secondary | ICD-10-CM

## 2023-02-18 DIAGNOSIS — C773 Secondary and unspecified malignant neoplasm of axilla and upper limb lymph nodes: Secondary | ICD-10-CM | POA: Insufficient documentation

## 2023-02-18 DIAGNOSIS — Z5112 Encounter for antineoplastic immunotherapy: Secondary | ICD-10-CM | POA: Insufficient documentation

## 2023-02-18 LAB — CBC WITH DIFFERENTIAL/PLATELET
Abs Immature Granulocytes: 0.03 10*3/uL (ref 0.00–0.07)
Basophils Absolute: 0.1 10*3/uL (ref 0.0–0.1)
Basophils Relative: 1 %
Eosinophils Absolute: 0.2 10*3/uL (ref 0.0–0.5)
Eosinophils Relative: 3 %
HCT: 31.6 % — ABNORMAL LOW (ref 36.0–46.0)
Hemoglobin: 11.1 g/dL — ABNORMAL LOW (ref 12.0–15.0)
Immature Granulocytes: 1 %
Lymphocytes Relative: 30 %
Lymphs Abs: 1.8 10*3/uL (ref 0.7–4.0)
MCH: 31.9 pg (ref 26.0–34.0)
MCHC: 35.1 g/dL (ref 30.0–36.0)
MCV: 90.8 fL (ref 80.0–100.0)
Monocytes Absolute: 0.7 10*3/uL (ref 0.1–1.0)
Monocytes Relative: 12 %
Neutro Abs: 3.3 10*3/uL (ref 1.7–7.7)
Neutrophils Relative %: 53 %
Platelets: 233 10*3/uL (ref 150–400)
RBC: 3.48 MIL/uL — ABNORMAL LOW (ref 3.87–5.11)
RDW: 11.9 % (ref 11.5–15.5)
WBC: 6.1 10*3/uL (ref 4.0–10.5)
nRBC: 0 % (ref 0.0–0.2)

## 2023-02-18 LAB — COMPREHENSIVE METABOLIC PANEL
ALT: 12 U/L (ref 0–44)
AST: 18 U/L (ref 15–41)
Albumin: 4.3 g/dL (ref 3.5–5.0)
Alkaline Phosphatase: 59 U/L (ref 38–126)
Anion gap: 9 (ref 5–15)
BUN: 28 mg/dL — ABNORMAL HIGH (ref 8–23)
CO2: 27 mmol/L (ref 22–32)
Calcium: 9.3 mg/dL (ref 8.9–10.3)
Chloride: 102 mmol/L (ref 98–111)
Creatinine, Ser: 1.27 mg/dL — ABNORMAL HIGH (ref 0.44–1.00)
GFR, Estimated: 42 mL/min — ABNORMAL LOW (ref >60)
Glucose, Bld: 103 mg/dL — ABNORMAL HIGH (ref 70–99)
Potassium: 3.7 mmol/L (ref 3.5–5.1)
Sodium: 138 mmol/L (ref 135–145)
Total Bilirubin: 0.4 mg/dL (ref 0.3–1.2)
Total Protein: 6.9 g/dL (ref 6.5–8.1)

## 2023-02-18 MED ORDER — SODIUM CHLORIDE 0.9% FLUSH
10.0000 mL | Freq: Once | INTRAVENOUS | Status: AC
  Administered 2023-02-18: 10 mL

## 2023-02-18 MED ORDER — HEPARIN SOD (PORK) LOCK FLUSH 100 UNIT/ML IV SOLN
500.0000 [IU] | Freq: Once | INTRAVENOUS | Status: AC | PRN
  Administered 2023-02-18: 500 [IU]

## 2023-02-18 MED ORDER — SODIUM CHLORIDE 0.9% FLUSH
10.0000 mL | INTRAVENOUS | Status: DC | PRN
  Administered 2023-02-18: 10 mL

## 2023-02-18 MED ORDER — TRASTUZUMAB-ANNS CHEMO 150 MG IV SOLR
6.0000 mg/kg | Freq: Once | INTRAVENOUS | Status: AC
  Administered 2023-02-18: 357 mg via INTRAVENOUS
  Filled 2023-02-18: qty 17

## 2023-02-18 MED ORDER — SODIUM CHLORIDE 0.9 % IV SOLN: Freq: Once | INTRAVENOUS | Status: AC

## 2023-02-18 NOTE — Patient Instructions (Signed)
[facility]  Discharge Instructions: Thank you for choosing [facility] to provide your oncology and hematology care.   If you have a lab appointment with the Cancer Center, please go directly to the Cancer Center and check in at the registration area.   Wear comfortable clothing and clothing appropriate for easy access to any Portacath or PICC line.   We strive to give you quality time with your provider. You may need to reschedule your appointment if you arrive late (15 or more minutes).  Arriving late affects you and other patients whose appointments are after yours.  Also, if you miss three or more appointments without notifying the office, you may be dismissed from the clinic at the provider's discretion.      For prescription refill requests, have your pharmacy contact our office and allow 72 hours for refills to be completed.    Today you received the following chemotherapy and/or immunotherapy agents: [id number]      To help prevent nausea and vomiting after your treatment, we encourage you to take your nausea medication as directed.  BELOW ARE SYMPTOMS THAT SHOULD BE REPORTED IMMEDIATELY: *FEVER GREATER THAN 100.4 F (38 C) OR HIGHER *CHILLS OR SWEATING *NAUSEA AND VOMITING THAT IS NOT CONTROLLED WITH YOUR NAUSEA MEDICATION *UNUSUAL SHORTNESS OF BREATH *UNUSUAL BRUISING OR BLEEDING *URINARY PROBLEMS (pain or burning when urinating, or frequent urination) *BOWEL PROBLEMS (unusual diarrhea, constipation, pain near the anus) TENDERNESS IN MOUTH AND THROAT WITH OR WITHOUT PRESENCE OF ULCERS (sore throat, sores in mouth, or a toothache) UNUSUAL RASH, SWELLING OR PAIN  UNUSUAL VAGINAL DISCHARGE OR ITCHING   Items with * indicate a potential emergency and should be followed up as soon as possible or go to the Emergency Department if any problems should occur.  Please show the CHEMOTHERAPY ALERT CARD or IMMUNOTHERAPY ALERT CARD  at check-in to the Emergency Department and triage nurse.  Should you have questions after your visit or need to cancel or reschedule your appointment, please contact [facility]  Dept: [phone]  and follow the prompts.  Office hours are 8:00 a.m. to 4:30 p.m. Monday - Friday. Please note that voicemails left after 4:00 p.m. may not be returned until the following business day.  We are closed weekends and major holidays. You have access to a nurse at all times for urgent questions. Please call the main number to the clinic Dept: [phone] and follow the prompts.   For any non-urgent questions, you may also contact your provider using MyChart. We now offer e-Visits for anyone 18 and older to request care online for non-urgent symptoms. For details visit [email or web address].   Also download the MyChart app! Go to the app store, search "MyChart", open the app, select [facility], and log in with your MyChart username and password.   

## 2023-03-04 ENCOUNTER — Other Ambulatory Visit: Payer: Self-pay

## 2023-03-04 MED ORDER — ESTRADIOL 0.1 MG/GM VA CREA: 1.0000 | TOPICAL_CREAM | VAGINAL | 12 refills | Status: DC

## 2023-03-07 DIAGNOSIS — L03113 Cellulitis of right upper limb: Secondary | ICD-10-CM | POA: Diagnosis not present

## 2023-03-07 DIAGNOSIS — C4490 Unspecified malignant neoplasm of skin, unspecified: Secondary | ICD-10-CM | POA: Diagnosis not present

## 2023-03-07 DIAGNOSIS — L989 Disorder of the skin and subcutaneous tissue, unspecified: Secondary | ICD-10-CM | POA: Diagnosis not present

## 2023-03-11 ENCOUNTER — Inpatient Hospital Stay: Payer: Medicare HMO

## 2023-03-11 ENCOUNTER — Encounter: Payer: Self-pay | Admitting: Hematology and Oncology

## 2023-03-11 ENCOUNTER — Inpatient Hospital Stay: Payer: Medicare HMO | Admitting: Hematology and Oncology

## 2023-03-11 VITALS — BP 135/64 | HR 71 | Temp 98.3°F | Resp 18 | Ht 62.0 in | Wt 134.6 lb

## 2023-03-11 DIAGNOSIS — I1310 Hypertensive heart and chronic kidney disease without heart failure, with stage 1 through stage 4 chronic kidney disease, or unspecified chronic kidney disease: Secondary | ICD-10-CM | POA: Diagnosis not present

## 2023-03-11 DIAGNOSIS — N183 Chronic kidney disease, stage 3 unspecified: Secondary | ICD-10-CM | POA: Diagnosis not present

## 2023-03-11 DIAGNOSIS — Z79899 Other long term (current) drug therapy: Secondary | ICD-10-CM | POA: Diagnosis not present

## 2023-03-11 DIAGNOSIS — Z7189 Other specified counseling: Secondary | ICD-10-CM | POA: Diagnosis not present

## 2023-03-11 DIAGNOSIS — Z90710 Acquired absence of both cervix and uterus: Secondary | ICD-10-CM | POA: Diagnosis not present

## 2023-03-11 DIAGNOSIS — Z90722 Acquired absence of ovaries, bilateral: Secondary | ICD-10-CM | POA: Diagnosis not present

## 2023-03-11 DIAGNOSIS — Z7982 Long term (current) use of aspirin: Secondary | ICD-10-CM | POA: Diagnosis not present

## 2023-03-11 DIAGNOSIS — C541 Malignant neoplasm of endometrium: Secondary | ICD-10-CM

## 2023-03-11 DIAGNOSIS — C773 Secondary and unspecified malignant neoplasm of axilla and upper limb lymph nodes: Secondary | ICD-10-CM

## 2023-03-11 DIAGNOSIS — Z5112 Encounter for antineoplastic immunotherapy: Secondary | ICD-10-CM | POA: Diagnosis not present

## 2023-03-11 DIAGNOSIS — Z9079 Acquired absence of other genital organ(s): Secondary | ICD-10-CM | POA: Diagnosis not present

## 2023-03-11 MED ORDER — TRASTUZUMAB-ANNS CHEMO 150 MG IV SOLR
6.0000 mg/kg | Freq: Once | INTRAVENOUS | Status: AC
  Administered 2023-03-11: 357 mg via INTRAVENOUS
  Filled 2023-03-11: qty 17

## 2023-03-11 MED ORDER — SODIUM CHLORIDE 0.9 % IV SOLN: Freq: Once | INTRAVENOUS | Status: AC

## 2023-03-11 MED ORDER — SODIUM CHLORIDE 0.9% FLUSH
10.0000 mL | INTRAVENOUS | Status: DC | PRN
  Administered 2023-03-11: 10 mL

## 2023-03-11 MED ORDER — LIDOCAINE-PRILOCAINE 2.5-2.5 % EX CREA: 1.0000 | TOPICAL_CREAM | CUTANEOUS | 3 refills | Status: AC | PRN

## 2023-03-11 MED ORDER — HEPARIN SOD (PORK) LOCK FLUSH 100 UNIT/ML IV SOLN
500.0000 [IU] | Freq: Once | INTRAVENOUS | Status: AC | PRN
  Administered 2023-03-11: 500 [IU]

## 2023-03-11 NOTE — Progress Notes (Signed)
Vicki Cervantes OFFICE PROGRESS NOTE  Patient Care Team: Vicki Cervantes, Vicki Cervantes as PCP - General (Internal Medicine)  ASSESSMENT & PLAN:  Endometrial cancer [facility]) Her last imaging showed no evidence of cancer recurrence Her recent EGD report was reviewed, likely benign etiology and does not need further follow-up Echocardiogram is within normal range She will continue trastuzumab indefinitely   CKD (chronic kidney disease), stage III (HCC) Renal function is stable. Monitor closely every other visit  No orders of the defined types were placed in this encounter.   All questions were answered. The patient knows to call the clinic with any problems, questions or concerns. The total time spent in the appointment was 20 minutes encounter with patients including review of chart and various tests results, discussions about plan of care and coordination of care plan   Vicki Cervantes, Vicki Cervantes 03/11/2023 1:37 PM  INTERVAL HISTORY: Please see below for problem oriented charting. she returns for treatment follow-up She is doing well and tolerated recent treatment well She have no signs or symptoms of congestive heart failure We reviewed echocardiogram results and blood work results  REVIEW OF SYSTEMS:   Constitutional: Denies fevers, chills or abnormal weight loss Eyes: Denies blurriness of vision Ears, nose, mouth, throat, and face: Denies mucositis or sore throat Respiratory: Denies cough, dyspnea or wheezes Cardiovascular: Denies palpitation, chest discomfort or lower extremity swelling Gastrointestinal:  Denies nausea, heartburn or change in bowel habits Skin: Denies abnormal skin rashes Lymphatics: Denies new lymphadenopathy or easy bruising Neurological:Denies numbness, tingling or new weaknesses Behavioral/Psych: Mood is stable, no new changes  All other systems were reviewed with the patient and are negative.  I have reviewed the past medical history, past surgical history,  social history and family history with the patient and they are unchanged from previous note.  ALLERGIES:  has No Known Allergies.  MEDICATIONS:  Current Outpatient Medications  Medication Sig Dispense Refill   acetaminophen (TYLENOL) 500 MG tablet Take 1,000 mg by mouth every 6 (six) hours as needed.     amLODipine (NORVASC) 5 MG tablet Take 5 mg by mouth daily.     aspirin EC 81 MG tablet Take 81 mg by mouth daily.     atorvastatin (LIPITOR) 20 MG tablet Take 20 mg by mouth daily.     citalopram (CELEXA) 20 MG tablet Take 20 mg by mouth daily.     estradiol (ESTRACE) 0.1 MG/GM vaginal cream Place 1 Applicatorful vaginally 3 (three) times a week. 42.5 g 12   levothyroxine (SYNTHROID, LEVOTHROID) 100 MCG tablet Take 100 mcg by mouth daily before breakfast.      lidocaine-prilocaine (EMLA) cream Apply 1 Application topically as needed. 30 g 3   losartan-hydrochlorothiazide (HYZAAR) 100-25 MG tablet      metoprolol succinate (TOPROL-XL) 25 MG 24 hr tablet Take 25 mg by mouth daily.     Multiple Vitamin (MULTIVITAMIN WITH MINERALS) TABS Take 1 tablet by mouth daily.     olmesartan-hydrochlorothiazide (BENICAR HCT) 40-25 MG tablet Take 1 tablet by mouth daily.     pantoprazole (PROTONIX) 40 MG tablet Take 40 mg by mouth daily.     valACYclovir (VALTREX) 1000 MG tablet valacyclovir 1 gram tablet     No current facility-administered medications for this visit.   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 Her cancer was treated with surgery but no radiation or chemotherapy.   Serous endometrial  cancer, MSI stable ER positive, PR neg, Her2/neu 3+      Endometrial cancer (HCC)  08/29/2016 Pathology Results   Endometrium, biopsy - HIGH GRADE ENDOMETRIAL CARCINOMA, SEE COMMENT. Microscopic Comment The sections show multiple fragments of adenocarcinoma displaying glandular and papillary patterns associated with high grade cytomorphology characterized by nuclear pleomorphism, prominent nucleoli and brisk mitosis. Immunohistochemical stains show that the tumor cells are positive for vimentin, p16, p53 with increased Ki-67 expression. Estrogen and progesterone receptor stains show patchy weak positivity. No significant positivity is seen with CEA. The findings are consistent with high grade endometrial carcinoma and the overall morphology and phenotypic features favor serous carcinoma.   08/29/2016 Initial Diagnosis   She presented with postmenopausal bleeding   10/03/2016 Imaging   CT C/A/P 09/2016 IMPRESSION: 1. Thickening of the endometrial canal up to 19 mm in fundus, presumably corresponding to the patient's reported endometrial carcinoma. 2. Multiple tiny pulmonary nodules scattered throughout the lungs bilaterally measuring 4 mm or less in size. Nodules of this size are typically considered statistically likely benign. In the setting of known primary malignancy, metastatic disease to the lungs is not excluded, but is not strongly favored on today's examination. Attention on followup studies is recommended to ensure the stability or resolution of these nodules. 3. Subcentimeter low-attenuation lesion in the central aspect of segment 8 of the liver is too small to characterize. This is statistically likely a tiny cyst, but warrants attention on follow-up studies to exclude the possibility of a solitary hepatic metastasis. 4. 1.5 x 1.5 x 1.7 cm well-circumscribed lesion in the proximal stomach. This is of uncertain etiology and significance, and could represent a benign gastric polyp, however,  further evaluation with nonemergent endoscopy is suggested in the near future for further evaluation. 5. **An incidental finding of potential clinical significance has been found. 1.1 x 1.6 cm thyroid nodule in the inferior aspect of the right lobe of the thyroid gland. Follow-up evaluation with nonemergent thyroid ultrasound is recommended in the near future to better evaluate this finding. This recommendation follows ACR consensuss guidelines: Managing Incidental Thyroid Nodules Detected on Imaging: White Paper of the ACR Incidental Thyroid Findings Committee. J Am Coll Radiol 2015;12(2):143-150.** 6. Aortic atherosclerosis, in addition to left anterior descending coronary artery disease   10/22/2016 Surgery   Robotic assisted total hysterectomy, BSO and bilateral pelvic lymphadenectomy  Final pathology revealed a 3cm polyp containing serous carcinoma but with no myometrial invasion, no LVSI and negative nodes.  Stage IA Uterine serous cancer   10/22/2016 Pathology Results   1. Lymph nodes, regional resection, right pelvic - SIX BENIGN LYMPH NODES (0/6). 2. Lymph nodes, regional resection, left pelvic - SEVEN BENIGN LYMPH NODES (0/7). 3. Uterus +/- tubes/ovaries, neoplastic, with right ovary and fallopian tube ENDOMYOMETRIUM - SEROUS CARCINOMA ARISING WITHIN AN ENDOMETRIAL POLYP - NO MYOMETRIAL INVASION IDENTIFIED - ADENOMYOSIS - LEIOMYOMA (1 CM) - SEE ONCOLOGY TABLE AND COMMENT CERVIX - CARCINOMA FOCALLY INVOLVES ENDOCERVICAL GLANDS - NABOTHIAN CYSTS RIGHT ADNEXA - BENIGN OVARY AND FALLOPIAN TUBE - NO CARCINOMA IDENTIFIED 4. Cul-de-sac biopsy - MESOTHELIAL HYPERPLASIA Microscopic Comment 3. ONCOLOGY TABLE-UTERUS, CARCINOMA OR CARCINOSARCOMA Specimen: Uterus, right fallopian tube and ovary  Procedure: Total hysterectomy and right salpingo-oophorectomy Lymph node sampling performed: Bilateral pelvic regional resection Specimen integrity: Intact Maximum tumor size: 3 cm  (polyp) Histologic type: Serous carcinoma Grade: High grade Myometrial invasion: Not identified Cervical stromal involvement: No, focal endocervical gland involvement Extent of involvement of other organs: Not identified Lymph - vascular invasion: Not identified Peritoneal washings: N/A Lymph nodes: Examined: 0 Sentinel 13 Non-sentinel 13 Total Lymph nodes with metastasis: 0 Isolated tumor cells (< 0.2 mm): 0 Micrometastasis: (> 0.2 mm and < 2.0 mm): 0 Macrometastasis: (> 2.0 mm): 0 Extracapsular extension: N/A Pelvic lymph nodes: 0 involved of 13 lymph nodes. Para-aortic lymph nodes: No para-aortic nodes submitted TNM code: pT1a, pNX FIGO Stage (based on pathologic findings, needs clinical correlation): IA Comment: Immunohistochemistry for cytokeratin AE1/AE3 is performed on all of the lymph nodes (parts 1 & 2) and no metastatic carcinoma is identified.   07/04/2017 Genetic Testing   The patient had genetic testing due to a personal history of breast and uterine cancer, and a family history of stomach cancer.  The Multi-Cancer Panel was ordered. The Multi-Cancer Panel offered by [facility] includes sequencing and/or deletion duplication testing of the following 83 genes: ALK, APC, ATM, AXIN2,BAP1,  BARD1, BLM, BMPR1A, BRCA1, BRCA2, BRIP1, CASR, CDC73, CDH1, CDK4, CDKN1B, CDKN1C, CDKN2A (p14ARF), CDKN2A (p16INK4a), CEBPA, CHEK2, CTNNA1, DICER1, DIS3L2, EGFR (c.2369C>T, p.Thr790Met variant only), EPCAM (Deletion/duplication testing only), FH, FLCN, GATA2, GPC3, GREM1 (Promoter region deletion/duplication testing only), HOXB13 (c.251G>A, p.Gly84Glu), HRAS, KIT, MAX, MEN1, MET, MITF (c.952G>A, p.Glu318Lys variant only), MLH1, MSH2, MSH3, MSH6, MUTYH, NBN, NF1, NF2, NTHL1, PALB2, PDGFRA, PHOX2B, PMS2, POLD1, POLE, POT1, PRKAR1A, PTCH1, PTEN, RAD50, RAD51C, RAD51D, RB1, RECQL4, RET, RUNX1, SDHAF2, SDHA (sequence changes only), SDHB, SDHC, SDHD, SMAD4, SMARCA4, SMARCB1, SMARCE1, STK11, SUFU, TERC,  TERT, TMEM127, TP53, TSC1, TSC2, VHL, WRN and WT1.   Results: No pathogenic variants were identified.  A variant of uncertain significance in the gene APC was identified.  c.791A>G (p.Gln264Arg).  The date of this test report is 07/04/2017.     Genetic Testing   Patient has genetic testing done for MSI. 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    12/03/2017 Imaging   CT Chest/Abd/Pelvis to follow pulmonary nodule and gastric mass IMPRESSION: 1. Stable CT of the chest. Small pulmonary nodules are unchanged when compared with previous exam. 2. No new findings identified. 3. Subcentimeter low-attenuation lesions within the liver are remain too small to characterize but are stable from prior exam. 4. Persistent indeterminate low-attenuation structure within the proximal stomach is unchanged measuring 1.4 cm. Correlation with direct visualization is advised   06/30/2018 Imaging   CT CHEST Lungs/Pleura: Stable scattered sub-cm pulmonary nodules are again seen bilaterally and are stable compared to previous studies. No new or enlarging pulmonary nodules or masses identified. No evidence of pulmonary infiltrate or pleural effusion.   08/19/2018 Echocardiogram   [place] is done in [place]: EF 60-65%. Mild impaired relaxation. (report scanned)   09/17/2018 Relapse/Recurrence   Presented with c/o vagina discharge.  Lesion noted at the left vaginal apex 6mm lesion removed Path c/w high grade serous cancer   09/17/2018 Pathology Results   Vagina, biopsy, left apex - HIGH GRADE SEROUS CARCINOMA.   09/28/2018 PET scan   1. Two hypermetabolic axial lymph nodes. Unusual site for metastatic endometrial carcinoma however the activity is more intense than typically seen in reactive adenopathy. Suggest ultrasound-guided percutaneous biopsy of the larger RIGHT axial lymph node 2. No evidence of local recurrence at  the vaginal cuff.  3. No metastatic adenopathy in the abdomen or  pelvis. 4. Stable small pulmonary nodules   10/09/2018 Pathology Results   Lymph node, needle/core biopsy, right axilla - METASTATIC CARCINOMA, SEE COMMENT. Microscopic Comment The carcinoma appears high grade. Immunohistochemistry is positive for cytokeratin 7, PAX-8, and ER. Cytokeratin 20, CDX-2, PR, and GATA-3 are negative. The findings along with the history are consistent with a gynecologic primary.    10/09/2018 Procedure   Ultrasound-guided core biopsies of a right axillary lymph node.   10/14/2018 Cancer Staging   Staging form: Corpus Uteri - Carcinoma and Carcinosarcoma, AJCC 8th Edition - Clinical: Stage IVB (cT1, cN0, pM1) - Signed by Vicki Cervantes, Vicki Cervantes on 10/14/2018   10/19/2018 Imaging   Placement of single lumen port a cath via right internal jugular vein. The catheter tip lies at the cavo-atrial junction. A power injectable port a cath was placed and is ready for immediate use.    10/21/2018 -  Chemotherapy   The patient had carboplatin and taxol for treatment   11/11/2018 - 03/12/2022 Chemotherapy   Patient is on Treatment Plan : BREAST Trastuzumab q21d     12/21/2018 PET scan   1. Interval resolution of hypermetabolic right axillary lymph nodes. No metabolic findings highly suspicious for recurrent metastatic disease. 2. New mild hypermetabolism within a borderline prominent portacaval lymph node, nonspecific. While a reactive node is favored, a lymph node metastasis cannot be entirely excluded. Suggest attention to this lymph node on follow-up PET-CT in 3-6 months. 3. Nonspecific new hypermetabolism at the ileocecal valve, more likely physiologic given absence of CT correlate. 4. Scattered subcentimeter pulmonary nodules are all stable and below PET resolution, more likely benign, continued CT surveillance advised. 5. Chronic findings include: Aortic Atherosclerosis (ICD10-I70.0). Marked diffuse colonic diverticulosis. Coronary atherosclerosis.     12/28/2018 Echocardiogram   1.  The left ventricle has normal systolic function with an ejection fraction of 60-65%. The cavity size was normal. Left ventricular diastolic Doppler parameters are consistent with impaired relaxation.  2. GLS recorded as -10.7 but LV appears hyperdynamic and tracking of endocardium appears poor.  3. The right ventricle has normal systolic function. The cavity was normal. There is no increase in right ventricular wall thickness.  4. Mild thickening of the mitral valve leaflet.  5. The aortic valve was not well visualized. Mild thickening of the aortic valve. Aortic valve regurgitation is trivial by color flow Doppler.   03/23/2019 Imaging   PET 1. No findings of hypermetabolic residual/recurrent or metastatic disease. 2. Similar low-level hypermetabolism within normal sized portocaval and right inguinal nodes, favored to be reactive. 3. Ongoing stability of small bilateral pulmonary nodules, favored to be benign. Below PET resolution. 4. Coronary artery atherosclerosis. Aortic Atherosclerosis   03/26/2019 Echocardiogram   1. The left ventricle has normal systolic function, with an ejection fraction of 55-60%. The cavity size was normal. Left ventricular diastolic Doppler parameters are consistent with impaired relaxation.  2. The right ventricle has normal systolic function. The cavity was normal.  3. The mitral valve is abnormal. Mild thickening of the mitral valve leaflet. There is mild mitral annular calcification present.  4. The tricuspid valve is grossly normal.  5. The aortic valve is tricuspid. Mild calcification of the aortic valve. No stenosis of the aortic valve.  6. The aorta is normal unless otherwise noted.  7. Normal LV systolic function; grade 1 diastolic dysfunction; GLS-15.2%.   06/30/2019 Imaging   Chest Impression:   1. No evidence  thoracic metastasis. 2. Stable small benign-appearing pulmonary nodules   Abdomen / Pelvis Impression:   1. No evidence of metastatic  disease in the abdomen pelvis. 2.  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 3.  Aortic Atherosclerosis (ICD10-I70.0).   06/30/2019 Echocardiogram    1. Left ventricular ejection fraction, by visual estimation, is 60 to 65%. The left ventricle has normal function. There is no left ventricular hypertrophy.  2. Left ventricular diastolic parameters are consistent with Grade I diastolic dysfunction (impaired relaxation).  3. The left ventricle has no regional wall motion abnormalities.  4. Global right ventricle has normal systolic function.The right ventricular size is normal. No increase in right ventricular wall thickness.  5. Left atrial size was mild-moderately dilated.  6. Right atrial size was normal.  7. The mitral valve is normal in structure. Trivial mitral valve regurgitation.  8. The tricuspid valve is normal in structure. Tricuspid valve regurgitation is trivial.  9. The aortic valve is normal in structure. Aortic valve regurgitation is trivial. No evidence of aortic valve sclerosis or stenosis. 10. The pulmonic valve was grossly normal. Pulmonic valve regurgitation is not visualized. 11. Mildly elevated pulmonary artery systolic pressure. 12. The inferior vena cava is normal in size with greater than 50% respiratory variability, suggesting right atrial pressure of 3 mmHg. 13. The average left ventricular global longitudinal strain is -12.5 %. GLS underestimated due to poor endocardial tracking.     09/27/2019 Imaging   1. Multiple small, nonspecific pulmonary nodules are again noted. The previously noted lung nodules are unchanged in size from previous exam. There is a new lung nodule within the medial right lower lobe which is nonspecific measure 4 mm. Attention at follow-up imaging is recommended. 2. No evidence of metastatic disease within the abdomen or pelvis. 3. Coronary artery calcifications.  The 4.  Aortic Atherosclerosis (ICD10-I70.0).   09/30/2019  Echocardiogram    1. Left ventricular ejection fraction, by estimation, is 60 to 65%. The left ventricle has normal function. The left ventricle has no regional wall motion abnormalities. Left ventricular diastolic parameters are consistent with Grade I diastolic dysfunction (impaired relaxation).  2. Right ventricular systolic function is normal. The right ventricular size is normal. There is normal pulmonary artery systolic pressure. The estimated right ventricular systolic pressure is 24.2 mmHg.  3. The mitral valve is normal in structure. No evidence of mitral valve regurgitation. No evidence of mitral stenosis.  4. The aortic valve is normal in structure. Aortic valve regurgitation is trivial. No aortic stenosis is present.  5. The inferior vena cava is normal in size with greater than 50% respiratory variability, suggesting right atrial pressure of 3 mmHg.     01/07/2020 Echocardiogram    1. Normal LV function; grade 1 diastolic dysfunction; mild AI; GLS -21%.  2. Left ventricular ejection fraction, by estimation, is 55 to 60%. The left ventricle has normal function. The left ventricle has no regional wall motion abnormalities. Left ventricular diastolic parameters are consistent with Grade I diastolic dysfunction (impaired relaxation).  3. Right ventricular systolic function is normal. The right ventricular size is normal.  4. The mitral valve is normal in structure. Trivial mitral valve regurgitation. No evidence of mitral stenosis.  5. The aortic valve is tricuspid. Aortic valve regurgitation is mild. Mild aortic valve sclerosis is present, with no evidence of aortic valve stenosis.  6. The inferior vena cava is normal in size with greater than 50% respiratory variability, suggesting right atrial pressure of 3 mmHg.  03/27/2020 Imaging   1. Status post hysterectomy. No evidence of recurrent or metastatic disease in the abdomen or pelvis. 2. Multiple small pulmonary nodules in the lung  bases are unchanged to the extent that they are included on current examination and remain most likely sequelae of prior infection or inflammation. Attention on follow-up. 3. Aortic Atherosclerosis (ICD10-I70.0).   04/17/2020 Echocardiogram   1. Normal LVEF, normal and unchanged GLS: -20.2%.  2. Left ventricular ejection fraction, by estimation, is 60 to 65%. The left ventricle has normal function. The left ventricle has no regional wall motion abnormalities. There is mild concentric left ventricular hypertrophy. Left ventricular diastolic parameters are consistent with Grade I diastolic dysfunction (impaired relaxation). The average left ventricular global longitudinal strain is -20.2 %. The global longitudinal strain is normal.  3. Right ventricular systolic function is normal. The right ventricular size is normal.  4. The mitral valve is normal in structure. Mild mitral valve regurgitation. No evidence of mitral stenosis.  5. The aortic valve is normal in structure. There is mild calcification of the aortic valve. There is mild thickening of the aortic valve. Aortic valve regurgitation is mild. No aortic stenosis is present.  6. The inferior vena cava is normal in size with greater than 50% respiratory variability, suggesting right atrial pressure of 3 mmHg.   08/02/2020 Echocardiogram   1. Left ventricular ejection fraction, by estimation, is 55 to 60%. The left ventricle has normal function. The left ventricle has no regional wall motion abnormalities. Left ventricular diastolic parameters are consistent with Grade I diastolic dysfunction (impaired relaxation). The average left ventricular global longitudinal strain is -27.0 %. The global longitudinal strain is normal.  2. Right ventricular systolic function is normal. The right ventricular size is normal. Tricuspid regurgitation signal is inadequate for assessing PA pressure.  3. The mitral valve is grossly normal. Trivial mitral valve  regurgitation. No evidence of mitral stenosis.  4. The aortic valve is tricuspid. Aortic valve regurgitation is mild. No aortic stenosis is present.  5. The inferior vena cava is normal in size with greater than 50% respiratory variability, suggesting right atrial pressure of 3 mmHg.   09/19/2020 Imaging   1. Status post hysterectomy. No evidence of recurrent or new metastatic disease within the chest, abdomen, or pelvis. 2. Scattered bilateral tiny pulmonary nodules, unchanged from prior studies and most likely sequela of prior infection or inflammation, recommend attention on follow-up imaging. No new suspicious pulmonary nodules or masses. 3. Subcutaneous edema and small hematoma overlying the posterior gluteal musculature, recommend correlation with recent history of trauma. 4. Mild low-density wall thickening of the gastric antrum, which may represent gastritis. 5. Hepatic steatosis. 6. Extensive sigmoid colonic diverticulosis without findings of acute diverticulitis. 7. Aortic atherosclerosis.  Aortic Atherosclerosis (ICD10-I70.0).   11/20/2020 Echocardiogram    1. Compared to echo from Jan 2022, Global longitudinal strain is less negative. (Previously -27%). Left ventricular ejection fraction, by estimation, is 60 to 65%. The left ventricle has normal function. The left ventricle has no regional wall motion abnormalities. There is mild left ventricular hypertrophy. Left ventricular diastolic parameters are indeterminate.  2. Right ventricular systolic function is normal. The right ventricular size is normal.  3. The mitral valve is normal in structure. Trivial mitral valve regurgitation.  4. Aortic valve regurgitation is mild. Mild aortic valve sclerosis is present, with no evidence of aortic valve stenosis.     03/12/2021 Echocardiogram   1. Left ventricular ejection fraction, by estimation, is 60 to 65%. Left  ventricular ejection fraction by 2D MOD biplane is 63.6 %. The left ventricle has  normal function. The left ventricle has no regional wall motion abnormalities. There is mild left ventricular hypertrophy. Left ventricular diastolic parameters are consistent with Grade I diastolic dysfunction (impaired relaxation). The average left ventricular global longitudinal strain is -20.0 %. The global longitudinal strain is normal.  2. Right ventricular systolic function is normal. The right ventricular size is normal. There is normal pulmonary artery systolic pressure. The estimated right ventricular systolic pressure is 20.0 mmHg.  3. The mitral valve is normal in structure. No evidence of mitral valve regurgitation. No evidence of mitral stenosis.  4. The aortic valve is tricuspid. Aortic valve regurgitation is trivial. No aortic stenosis is present. Aortic regurgitation PHT measures 403 msec.  5. The inferior vena cava is normal in size with greater than 50% respiratory variability, suggesting right atrial pressure of 3 mmHg.   04/02/2021 Imaging   Stable small pulmonary nodules scattered throughout the chest, unchanged, without new or suspicious pulmonary nodule.   Three-vessel coronary artery disease.   Aortic Atherosclerosis (ICD10-I70.0).   06/06/2021 Imaging    1. Left ventricular ejection fraction, by estimation, is 60 to 65%. The left ventricle has normal function. The left ventricle has no regional wall motion abnormalities. Left ventricular diastolic parameters are consistent with Grade I diastolic dysfunction (impaired relaxation).  2. Right ventricular systolic function is normal. The right ventricular size is normal. There is normal pulmonary artery systolic pressure. The estimated right ventricular systolic pressure is 28.6 mmHg.  3. Left atrial size was mildly dilated.  4. The mitral valve is normal in structure. Trivial mitral valve regurgitation.  5. The aortic valve is tricuspid. Aortic valve regurgitation is mild. No aortic stenosis is present.  6. The inferior vena  cava is normal in size with greater than 50% respiratory variability, suggesting right atrial pressure of 3 mmHg.  7. GLS attempted but not reported due to suboptimal tracking.     09/25/2021 Echocardiogram    1. Left ventricular ejection fraction, by estimation, is 60 to 65%. Left ventricular ejection fraction by 3D volume is 56 %. The left ventricle has normal function. The left ventricle has no regional wall motion abnormalities. Left ventricular diastolic parameters were normal.  2. Right ventricular systolic function is normal. The right ventricular size is normal. There is normal pulmonary artery systolic pressure.  3. The mitral valve is normal in structure. Trivial mitral valve regurgitation. No evidence of mitral stenosis.  4. The aortic valve is tricuspid. Aortic valve regurgitation is mild. No aortic stenosis is present.  5. The inferior vena cava is normal in size with greater than 50% respiratory variability, suggesting right atrial pressure of 3 mmHg.   10/15/2021 Imaging   1. Stable examination post prior hysterectomy without evidence of local recurrence or metastatic disease within the chest, abdomen, or pelvis. 2. Unchanged small bilateral pulmonary nodules, likely benign. 3. Left-sided colonic diverticulosis without findings of acute diverticulitis. 4. Aortic Atherosclerosis (ICD10-I70.0).       12/31/2021 Echocardiogram    1. Left ventricular ejection fraction, by estimation, is 65 to 70%. The left ventricle has normal function. The left ventricle has no regional wall motion abnormalities. Left ventricular diastolic parameters are consistent with Grade I diastolic dysfunction (impaired relaxation).  2. Right ventricular systolic function is normal. The right ventricular size is normal. There is normal pulmonary artery systolic pressure. The estimated right ventricular systolic pressure is 27.4 mmHg.  3. The mitral valve  is grossly normal. Trivial mitral valve regurgitation. No  evidence of mitral stenosis.  4. The aortic valve is tricuspid. Aortic valve regurgitation is mild. No aortic stenosis is present.  5. The inferior vena cava is normal in size with greater than 50% respiratory variability, suggesting right atrial pressure of 3 mmHg.     04/02/2022 -  Chemotherapy   Patient is on Treatment Plan : BREAST MAINTENANCE Trastuzumab IV (6) or SQ (600) D1 q21d X 11 Cycles     04/04/2022 Echocardiogram    1. Left ventricular ejection fraction, by estimation, is 65 to 70%. The left ventricle has normal function. The left ventricle has no regional wall motion abnormalities. Left ventricular diastolic parameters are consistent with Grade I diastolic dysfunction (impaired relaxation).  2. Right ventricular systolic function is normal. The right ventricular size is normal.  3. Left atrial size was mildly dilated.  4. The mitral valve is normal in structure. Trivial mitral valve regurgitation. No evidence of mitral stenosis.  5. The aortic valve is tricuspid. There is mild calcification of the aortic valve. Aortic valve regurgitation is mild. No aortic stenosis is present. Aortic regurgitation PHT measures 613 msec. Aortic valve area, by VTI measures 1.70 cm. Aortic valve mean gradient measures 3.0 mmHg. Aortic valve Vmax measures 1.15 m/s.  6. The inferior vena cava is normal in size with greater than 50% respiratory variability, suggesting right atrial pressure of 3 mmHg.   07/02/2022 Echocardiogram   1. Left ventricular ejection fraction, by estimation, is 60 to 65%. Left ventricular ejection fraction by 3D volume is 58 %. The left ventricle has normal function. The left ventricle has no regional wall motion abnormalities. Left ventricular diastolic parameters are consistent with Grade I diastolic dysfunction (impaired relaxation). The average left ventricular global longitudinal strain is -20.7 %. The global longitudinal strain is normal.  2. Right ventricular systolic  function is normal. The right ventricular size is normal. Tricuspid regurgitation signal is inadequate for assessing PA pressure.  3. Left atrial size was mildly dilated.  4. The mitral valve is normal in structure. Trivial mitral valve regurgitation. No evidence of mitral stenosis.  5. The aortic valve is grossly normal. Aortic valve regurgitation is mild. No aortic stenosis is present.  6. The inferior vena cava is normal in size with greater than 50% respiratory variability, suggesting right atrial pressure of 3 mmHg.   11/05/2022 Echocardiogram       1. Left ventricular ejection fraction, by estimation, is 55 to 60%. Left ventricular ejection fraction by 3D volume is 59 %. The left ventricle has normal function. The left ventricle has no regional wall motion abnormalities. Left ventricular diastolic  parameters are consistent with Grade I diastolic dysfunction (impaired relaxation). The average left ventricular global longitudinal strain is -16.8 %. The global longitudinal strain is normal.  2. Right ventricular systolic function is normal. The right ventricular size is normal. There is normal pulmonary artery systolic pressure.  3. The mitral valve is normal in structure. No evidence of mitral valve regurgitation. No evidence of mitral stenosis.  4. The aortic valve is normal in structure. Aortic valve regurgitation is mild. No aortic stenosis is present.  5. The inferior vena cava is normal in size with greater than 50% respiratory variability, suggesting right atrial pressure of 3 mmHg.     11/07/2022 Imaging   CT CHEST ABDOMEN PELVIS W CONTRAST  Result Date: 11/07/2022 CLINICAL DATA:  Cervical cancer. Assess treatment response. On chemotherapy * Tracking Code: BO * EXAM:  CT CHEST, ABDOMEN, AND PELVIS WITH CONTRAST TECHNIQUE: Multidetector CT imaging of the chest, abdomen and pelvis was performed following the standard protocol during bolus administration of intravenous contrast. RADIATION  DOSE REDUCTION: This exam was performed according to the departmental dose-optimization program which includes automated exposure control, adjustment of the mA and/or kV according to patient size and/or use of iterative reconstruction technique. CONTRAST:  OMNIPAQUE IOHEXOL 300 MG/ML  SOLN COMPARISON:  CT 10/15/2021 and older FINDINGS: CT CHEST FINDINGS Cardiovascular: Right upper chest port. Heart is nonenlarged. No pericardial effusion. The thoracic aorta has a normal course and caliber with scattered vascular calcifications. There is a bovine type aortic arch, a normal variant. Mediastinum/Nodes: Heterogeneous small thyroid gland with a nodular area in the right is unchanged from previous. As stated previously, no specific imaging follow-up. No specific abnormal lymph node enlargement seen in the axillary regions, hilum or mediastinum. Surgical clips in the left axillary region. Breast implants are noted. Normal caliber thoracic esophagus. Lungs/Pleura: There is some linear opacity lung bases likely scar or atelectasis. No pleural effusion. No consolidation or pneumothorax. There are a few scattered tiny lung nodules identified. Example 3 mm superior segment left lower lobe on series 6, image 49 which is stable. Lingular nodule measuring 4 mm on the prior, today is stable on series 6, image 73. 3 mm nodule along the margin of the minor fissure on the right is also stable. There is a larger ill-defined areas seen right lung on the prior examination series 4, image 63 which has resolved. This may have been infectious or inflammatory. There is a small area of bronchiectasis in this location today on series 6, image 58. No new dominant lung nodule. Musculoskeletal: Diffuse moderate degenerative changes of the spine. CT ABDOMEN PELVIS FINDINGS Hepatobiliary: Fatty liver infiltration. There are some tiny low-attenuation lesion seen in the liver which are too small to completely characterize but unchanged from  previous. Simple attention on follow-up for the patient's neoplasm. Example series 2, image 44 anterior to branches of the right hepatic vein. Gallbladder is nondilated. Patent portal vein. Pancreas: Unremarkable. No pancreatic ductal dilatation or surrounding inflammatory changes. Spleen: Normal in size without focal abnormality. Adrenals/Urinary Tract: The adrenal glands are preserved. No enhancing renal mass. There are some tiny low-attenuation lesion seen along each kidney which are too small to completely characterize likely small cysts. No specific imaging follow-up. Bosniak 2 lesions. Contracted urinary bladder. Stomach/Bowel: Large bowel has extensive left-sided colonic stool. Extensive colonic diverticulosis as well. The large bowel is nondilated. Small bowel is nondilated. There is a nodular area along the upper aspect of the stomach fold thickening. On series 2, image 45 this measures 18 by 14 mm. This is not well seen previously but on the prior study the stomach is more collapsed. There may have been something in retrospect in this location. There also is some nodular areas along the antrum. Recommend further evaluation to exclude underlying lesion. Vascular/Lymphatic: Normal caliber aorta and IVC with scattered vascular calcifications. No specific abnormal lymph node enlargement identified in the abdomen and pelvis. Reproductive: Status post hysterectomy. No adnexal masses. Other: No free air or free fluid. Musculoskeletal: Moderate degenerative changes of the spine and pelvis. Trace anterolisthesis at L4-5 and L5-S1. 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 IMPRESSION: Stable tiny multiple pulmonary nodules. 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 No developing lymph node enlargement or fluid collection. Colonic diverticulosis of the sigmoid colon with moderate stool. There are nodular masslike areas seen  in the stomach including towards the fundus and small area towards  the antrum with fold thickening. Hyperplastic polyps are possible. Recommend further workup of the stomach when appropriate to exclude an aggressive process. Electronically Signed   By: Karen Kays M.D.   On: 11/07/2022 13:39   ECHOCARDIOGRAM COMPLETE  Result Date: 11/05/2022    ECHOCARDIOGRAM REPORT   Patient Name:   Vicki Cervantes Date of Exam: 11/05/2022 Medical Rec #:  [id number]      Height:       62.0 in Accession #:    [id number]     Weight:       134.8 lb Date of Birth:  June 03, 1941      BSA:          1.617 m Patient Age:    81 years       BP:           144/72 mmHg Patient Gender: F              HR:           69 bpm. Exam Location:  Outpatient Procedure: 2D Echo, 3D Echo, Cardiac Doppler, Color Doppler and Strain Analysis Indications:    chemo  History:        Patient has prior history of Echocardiogram examinations. Risk                 Factors:Hypertension.  Sonographer:    Mike Gip Referring Phys: [id number] Temica Righetti IMPRESSIONS  1. Left ventricular ejection fraction, by estimation, is 55 to 60%. Left ventricular ejection fraction by 3D volume is 59 %. The left ventricle has normal function. The left ventricle has no regional wall motion abnormalities. Left ventricular diastolic  parameters are consistent with Grade I diastolic dysfunction (impaired relaxation). The average left ventricular global longitudinal strain is -16.8 %. The global longitudinal strain is normal.  2. Right ventricular systolic function is normal. The right ventricular size is normal. There is normal pulmonary artery systolic pressure.  3. The mitral valve is normal in structure. No evidence of mitral valve regurgitation. No evidence of mitral stenosis.  4. The aortic valve is normal in structure. Aortic valve regurgitation is mild. No aortic stenosis is present.  5. The inferior vena cava is normal in size with greater than 50% respiratory variability, suggesting right atrial pressure of 3 mmHg. Comparison(s): Prior  07/01/2022: EF 60%, GLS -20.7% Prior 03/2022: GLS-15.3. FINDINGS  Left Ventricle: Left ventricular ejection fraction, by estimation, is 55 to 60%. Left ventricular ejection fraction by 3D volume is 59 %. The left ventricle has normal function. The left ventricle has no regional wall motion abnormalities. The average left ventricular global longitudinal strain is -16.8 %. The global longitudinal strain is normal. The left ventricular internal cavity size was normal in size. There is no left ventricular hypertrophy. Left ventricular diastolic parameters are consistent  with Grade I diastolic dysfunction (impaired relaxation). Right Ventricle: The right ventricular size is normal. No increase in right ventricular wall thickness. Right ventricular systolic function is normal. There is normal pulmonary artery systolic pressure. The tricuspid regurgitant velocity is 1.79 m/s, and  with an assumed right atrial pressure of 3 mmHg, the estimated right ventricular systolic pressure is 15.8 mmHg. Left Atrium: Left atrial size was normal in size. Right Atrium: Right atrial size was normal in size. Pericardium: There is no evidence of pericardial effusion. Mitral Valve: The mitral valve is normal in structure. No evidence of mitral valve regurgitation. No  evidence of mitral valve stenosis. Tricuspid Valve: The tricuspid valve is normal in structure. Tricuspid valve regurgitation is not demonstrated. No evidence of tricuspid stenosis. Aortic Valve: The aortic valve is normal in structure. Aortic valve regurgitation is mild. Aortic regurgitation PHT measures 497 msec. No aortic stenosis is present. Pulmonic Valve: The pulmonic valve was normal in structure. Pulmonic valve regurgitation is mild. No evidence of pulmonic stenosis. Aorta: The aortic root is normal in size and structure. Venous: The inferior vena cava is normal in size with greater than 50% respiratory variability, suggesting right atrial pressure of 3 mmHg. IAS/Shunts:  No atrial level shunt detected by color flow Doppler.  LEFT VENTRICLE PLAX 2D LVIDd:         3.80 cm         Diastology LVIDs:         2.50 cm         LV e' medial:    7.51 cm/s LV PW:         1.00 cm         LV E/e' medial:  7.0 LV IVS:        1.00 cm         LV e' lateral:   8.16 cm/s LVOT diam:     1.90 cm         LV E/e' lateral: 6.4 LV SV:         49 LV SV Index:   30              2D LVOT Area:     2.84 cm        Longitudinal                                Strain                                2D Strain GLS  -16.8 % LV Volumes (MOD)               Avg: LV vol d, MOD    62.3 ml A2C:                           3D Volume EF LV vol d, MOD    68.5 ml       LV 3D EF:    Left A4C:                                        ventricul LV vol s, MOD    25.6 ml                    ar A2C:                                        ejection LV vol s, MOD    24.1 ml                    fraction A4C:  by 3D LV SV MOD A2C:   36.7 ml                    volume is LV SV MOD A4C:   68.5 ml                    59 %. LV SV MOD BP:    39.9 ml                                 3D Volume EF:                                3D EF:        59 %                                LV EDV:       116 ml                                LV ESV:       48 ml                                LV SV:        68 ml RIGHT VENTRICLE             IVC RV Basal diam:  3.00 cm     IVC diam: 1.40 cm RV S prime:     10.20 cm/s TAPSE (M-mode): 1.9 cm LEFT ATRIUM             Index        RIGHT ATRIUM           Index LA diam:        3.40 cm 2.10 cm/m   RA Area:     11.20 cm LA Vol (A2C):   49.4 ml 30.56 ml/m  RA Volume:   22.50 ml  13.92 ml/m LA Vol (A4C):   28.3 ml 17.51 ml/m LA Biplane Vol: 38.6 ml 23.88 ml/m  AORTIC VALVE LVOT Vmax:   79.80 cm/s LVOT Vmean:  49.400 cm/s LVOT VTI:    0.173 m AI PHT:      497 msec  AORTA Ao Root diam: 3.20 cm MITRAL VALVE               TRICUSPID VALVE MV Area (PHT): 2.84 cm    TR Peak grad:   12.8 mmHg MV  Decel Time: 267 msec    TR Vmax:        179.00 cm/s MV E velocity: 52.40 cm/s MV A velocity: 69.80 cm/s  SHUNTS MV E/A ratio:  0.75        Systemic VTI:  0.17 m                            Systemic Diam: 1.90 cm Donato Schultz Vicki Cervantes Electronically signed by Donato Schultz Vicki Cervantes Signature Date/Time: 11/05/2022/2:11:35 PM    Final       02/17/2023 Echocardiogram   ECHOCARDIOGRAM COMPLETE  Result Date: 02/17/2023    ECHOCARDIOGRAM REPORT   Patient Name:   Vicki Cervantes Date of Exam: 02/17/2023 Medical Rec #:  [id number]      Height:       62.0 in Accession #:    [id number]     Weight:       137.4 lb Date of Birth:  1941/05/08      BSA:          1.630 m Patient Age:    82 years       BP:           143/77 mmHg Patient Gender: F              HR:           71 bpm. Exam Location:  Outpatient Procedure: 2D Echo, Strain Analysis, Color Doppler and Cardiac Doppler Indications:    Chemo  History:        Patient has prior history of Echocardiogram examinations, most                 recent 11/05/2022. Risk Factors:Hypertension.  Referring Phys: [id number] Atlee Villers IMPRESSIONS  1. Left ventricular ejection fraction, by estimation, is 60 to 65%. The left ventricle has normal function. The left ventricle has no regional wall motion abnormalities. Left ventricular diastolic parameters are consistent with Grade I diastolic dysfunction (impaired relaxation).  2. Right ventricular systolic function is normal. The right ventricular size is normal.  3. The mitral valve is normal in structure. Trivial mitral valve regurgitation.  4. The aortic valve was not well visualized. Aortic valve regurgitation is not visualized.  5. The inferior vena cava is normal in size with greater than 50% respiratory variability, suggesting right atrial pressure of 3 mmHg. FINDINGS  Left Ventricle: Left ventricular ejection fraction, by estimation, is 60 to 65%. The left ventricle has normal function. The left ventricle has no regional wall motion abnormalities. The left  ventricular internal cavity size was normal in size. There is  no left ventricular hypertrophy. Left ventricular diastolic parameters are consistent with Grade I diastolic dysfunction (impaired relaxation). Right Ventricle: The right ventricular size is normal. Right ventricular systolic function is normal. Left Atrium: Left atrial size was normal in size. Right Atrium: Right atrial size was normal in size. Pericardium: There is no evidence of pericardial effusion. Mitral Valve: The mitral valve is normal in structure. Trivial mitral valve regurgitation. Tricuspid Valve: The tricuspid valve is normal in structure. Tricuspid valve regurgitation is not demonstrated. Aortic Valve: The aortic valve was not well visualized. Aortic valve regurgitation is not visualized. Pulmonic Valve: Pulmonic valve regurgitation is not visualized. Aorta: The aortic root and ascending aorta are structurally normal, with no evidence of dilitation. Venous: The inferior vena cava is normal in size with greater than 50% respiratory variability, suggesting right atrial pressure of 3 mmHg. IAS/Shunts: No atrial level shunt detected by color flow Doppler.  LEFT VENTRICLE PLAX 2D LVIDd:         4.50 cm   Diastology LVIDs:         3.10 cm   LV e' medial:    5.33 cm/s LV PW:         0.90 cm   LV E/e' medial:  13.3 LV IVS:        0.90 cm   LV e' lateral:   7.07 cm/s LVOT diam:     2.00 cm   LV E/e' lateral: 10.1 LV SV:         44 LV SV Index:   27 LVOT Area:  3.14 cm  RIGHT VENTRICLE            IVC RV S prime:     8.59 cm/s  IVC diam: 0.70 cm TAPSE (M-mode): 1.7 cm LEFT ATRIUM             Index        RIGHT ATRIUM           Index LA Vol (A2C):   43.9 ml 26.94 ml/m  RA Area:     11.00 cm LA Vol (A4C):   35.5 ml 21.78 ml/m  RA Volume:   21.50 ml  13.19 ml/m LA Biplane Vol: 41.6 ml 25.53 ml/m  AORTIC VALVE LVOT Vmax:   59.60 cm/s LVOT Vmean:  41.100 cm/s LVOT VTI:    0.139 m MITRAL VALVE MV Area (PHT): 4.57 cm    SHUNTS MV Decel Time: 166  msec    Systemic VTI:  0.14 m MV E velocity: 71.10 cm/s  Systemic Diam: 2.00 cm MV A velocity: 78.40 cm/s MV E/A ratio:  0.91 Photographer signed by Carolan Clines Signature Date/Time: 02/17/2023/12:11:53 PM    Final       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/14/2018 Initial Diagnosis   Metastasis to lymph nodes (HCC)   10/21/2018 - 02/24/2019 Chemotherapy   The patient had palonosetron (ALOXI) injection 0.25 mg, 0.25 mg, Intravenous,  Once, 7 of 7 cycles Administration: 0.25 mg (10/21/2018), 0.25 mg (11/11/2018), 0.25 mg (12/02/2018), 0.25 mg (12/23/2018), 0.25 mg (01/13/2019), 0.25 mg (02/03/2019), 0.25 mg (02/24/2019) CARBOplatin (PARAPLATIN) 310 mg in sodium chloride 0.9 % 250 mL chemo infusion, 310 mg, Intravenous,  Once, 7 of 7 cycles Dose modification: 300 mg (original dose 311.5 mg, Cycle 4, Reason: Dose not tolerated) Administration: 310 mg (10/21/2018), 300 mg (11/11/2018), 300 mg (12/02/2018), 300 mg (12/23/2018), 300 mg (01/13/2019), 300 mg (02/03/2019), 300 mg (02/24/2019) PACLitaxel (TAXOL) 222 mg in sodium chloride 0.9 % 250 mL chemo infusion (> 80mg /m2), 135 mg/m2 = 222 mg, Intravenous,  Once, 7 of 7 cycles Administration: 222 mg (10/21/2018), 222 mg (11/11/2018), 222 mg (12/02/2018), 222 mg (12/23/2018), 222 mg (01/13/2019), 222 mg (02/03/2019), 222 mg (02/24/2019) fosaprepitant (EMEND) 150 mg, dexamethasone (DECADRON) 12 mg in sodium chloride 0.9 % 145 mL IVPB, , Intravenous,  Once, 7 of 7 cycles Administration:  (10/21/2018),  (11/11/2018),  (12/02/2018),  (12/23/2018),  (01/13/2019),  (02/03/2019),  (02/24/2019)  for chemotherapy treatment.    04/02/2022 -  Chemotherapy   Patient is on Treatment Plan : BREAST MAINTENANCE Trastuzumab IV (6) or SQ (600) D1 q21d X 11 Cycles       PHYSICAL EXAMINATION: ECOG PERFORMANCE STATUS: 0 - Asymptomatic  Vitals:   03/11/23 0921  BP: 135/64  Pulse: 71  Resp: 18  Temp: 98.3 F (36.8 C)  SpO2: 96%   Filed Weights   03/11/23 0921  Weight: 134 lb 9.6 oz (61.1  kg)    GENERAL:alert, no distress and comfortable NEURO: alert & oriented x 3 with fluent speech, no focal motor/sensory deficits  LABORATORY DATA:  I have reviewed the data as listed    Component Value Date/Time   NA 138 02/18/2023 0840   NA 139 02/13/2017 1512   K 3.7 02/18/2023 0840   K 4.2 02/13/2017 1512   CL 102 02/18/2023 0840   CO2 27 02/18/2023 0840   CO2 28 02/13/2017 1512   GLUCOSE 103 (H) 02/18/2023 0840   GLUCOSE 118 02/13/2017 1512   BUN 28 (H) 02/18/2023 0840   BUN  25.2 02/13/2017 1512   CREATININE 1.27 (H) 02/18/2023 0840   CREATININE 1.20 (H) 11/05/2022 1215   CREATININE 1.2 (H) 02/13/2017 1512   CALCIUM 9.3 02/18/2023 0840   CALCIUM 10.0 02/13/2017 1512   PROT 6.9 02/18/2023 0840   ALBUMIN 4.3 02/18/2023 0840   AST 18 02/18/2023 0840   AST 19 11/05/2022 1215   ALT 12 02/18/2023 0840   ALT 12 11/05/2022 1215   ALKPHOS 59 02/18/2023 0840   BILITOT 0.4 02/18/2023 0840   BILITOT 0.4 11/05/2022 1215   GFRNONAA 42 (L) 02/18/2023 0840   GFRNONAA 45 (L) 11/05/2022 1215   GFRAA 47 (L) 03/28/2020 0933   GFRAA 42 (L) 12/21/2019 0850    No results found for: "SPEP", "UPEP"  Lab Results  Component Value Date   WBC 6.1 02/18/2023   NEUTROABS 3.3 02/18/2023   HGB 11.1 (L) 02/18/2023   HCT 31.6 (L) 02/18/2023   MCV 90.8 02/18/2023   PLT 233 02/18/2023      Chemistry      Component Value Date/Time   NA 138 02/18/2023 0840   NA 139 02/13/2017 1512   K 3.7 02/18/2023 0840   K 4.2 02/13/2017 1512   CL 102 02/18/2023 0840   CO2 27 02/18/2023 0840   CO2 28 02/13/2017 1512   BUN 28 (H) 02/18/2023 0840   BUN 25.2 02/13/2017 1512   CREATININE 1.27 (H) 02/18/2023 0840   CREATININE 1.20 (H) 11/05/2022 1215   CREATININE 1.2 (H) 02/13/2017 1512      Component Value Date/Time   CALCIUM 9.3 02/18/2023 0840   CALCIUM 10.0 02/13/2017 1512   ALKPHOS 59 02/18/2023 0840   AST 18 02/18/2023 0840   AST 19 11/05/2022 1215   ALT 12 02/18/2023 0840   ALT 12  11/05/2022 1215   BILITOT 0.4 02/18/2023 0840   BILITOT 0.4 11/05/2022 1215       RADIOGRAPHIC STUDIES: I have personally reviewed the radiological images as listed and agreed with the findings in the report. ECHOCARDIOGRAM COMPLETE  Result Date: 02/17/2023    ECHOCARDIOGRAM REPORT   Patient Name:   Vicki Cervantes Date of Exam: 02/17/2023 Medical Rec #:  [id number]      Height:       62.0 in Accession #:    [id number]     Weight:       137.4 lb Date of Birth:  03-Apr-1941      BSA:          1.630 m Patient Age:    82 years       BP:           143/77 mmHg Patient Gender: F              HR:           71 bpm. Exam Location:  Outpatient Procedure: 2D Echo, Strain Analysis, Color Doppler and Cardiac Doppler Indications:    Chemo  History:        Patient has prior history of Echocardiogram examinations, most                 recent 11/05/2022. Risk Factors:Hypertension.  Referring Phys: [id number] Dang Mathison IMPRESSIONS  1. Left ventricular ejection fraction, by estimation, is 60 to 65%. The left ventricle has normal function. The left ventricle has no regional wall motion abnormalities. Left ventricular diastolic parameters are consistent with Grade I diastolic dysfunction (impaired relaxation).  2. Right ventricular systolic function is normal. The right ventricular size is normal.  3. The mitral valve is normal in structure. Trivial mitral valve regurgitation.  4. The aortic valve was not well visualized. Aortic valve regurgitation is not visualized.  5. The inferior vena cava is normal in size with greater than 50% respiratory variability, suggesting right atrial pressure of 3 mmHg. FINDINGS  Left Ventricle: Left ventricular ejection fraction, by estimation, is 60 to 65%. The left ventricle has normal function. The left ventricle has no regional wall motion abnormalities. The left ventricular internal cavity size was normal in size. There is  no left ventricular hypertrophy. Left ventricular diastolic parameters are  consistent with Grade I diastolic dysfunction (impaired relaxation). Right Ventricle: The right ventricular size is normal. Right ventricular systolic function is normal. Left Atrium: Left atrial size was normal in size. Right Atrium: Right atrial size was normal in size. Pericardium: There is no evidence of pericardial effusion. Mitral Valve: The mitral valve is normal in structure. Trivial mitral valve regurgitation. Tricuspid Valve: The tricuspid valve is normal in structure. Tricuspid valve regurgitation is not demonstrated. Aortic Valve: The aortic valve was not well visualized. Aortic valve regurgitation is not visualized. Pulmonic Valve: Pulmonic valve regurgitation is not visualized. Aorta: The aortic root and ascending aorta are structurally normal, with no evidence of dilitation. Venous: The inferior vena cava is normal in size with greater than 50% respiratory variability, suggesting right atrial pressure of 3 mmHg. IAS/Shunts: No atrial level shunt detected by color flow Doppler.  LEFT VENTRICLE PLAX 2D LVIDd:         4.50 cm   Diastology LVIDs:         3.10 cm   LV e' medial:    5.33 cm/s LV PW:         0.90 cm   LV E/e' medial:  13.3 LV IVS:        0.90 cm   LV e' lateral:   7.07 cm/s LVOT diam:     2.00 cm   LV E/e' lateral: 10.1 LV SV:         44 LV SV Index:   27 LVOT Area:     3.14 cm  RIGHT VENTRICLE            IVC RV S prime:     8.59 cm/s  IVC diam: 0.70 cm TAPSE (M-mode): 1.7 cm LEFT ATRIUM             Index        RIGHT ATRIUM           Index LA Vol (A2C):   43.9 ml 26.94 ml/m  RA Area:     11.00 cm LA Vol (A4C):   35.5 ml 21.78 ml/m  RA Volume:   21.50 ml  13.19 ml/m LA Biplane Vol: 41.6 ml 25.53 ml/m  AORTIC VALVE LVOT Vmax:   59.60 cm/s LVOT Vmean:  41.100 cm/s LVOT VTI:    0.139 m MITRAL VALVE MV Area (PHT): 4.57 cm    SHUNTS MV Decel Time: 166 msec    Systemic VTI:  0.14 m MV E velocity: 71.10 cm/s  Systemic Diam: 2.00 cm MV A velocity: 78.40 cm/s MV E/A ratio:  0.91 Actuary signed by Carolan Clines Signature Date/Time: 02/17/2023/12:11:53 PM    Final

## 2023-03-11 NOTE — Patient Instructions (Signed)

## 2023-03-11 NOTE — Assessment & Plan Note (Signed)
Renal function is stable. Monitor closely every other visit

## 2023-03-11 NOTE — Assessment & Plan Note (Signed)
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

## 2023-03-12 ENCOUNTER — Other Ambulatory Visit: Payer: Self-pay

## 2023-03-31 ENCOUNTER — Encounter: Payer: Self-pay | Admitting: Hematology and Oncology

## 2023-03-31 ENCOUNTER — Telehealth: Payer: Self-pay

## 2023-03-31 DIAGNOSIS — Z85828 Personal history of other malignant neoplasm of skin: Secondary | ICD-10-CM | POA: Diagnosis not present

## 2023-03-31 DIAGNOSIS — D485 Neoplasm of uncertain behavior of skin: Secondary | ICD-10-CM | POA: Diagnosis not present

## 2023-03-31 DIAGNOSIS — D045 Carcinoma in situ of skin of trunk: Secondary | ICD-10-CM | POA: Diagnosis not present

## 2023-03-31 DIAGNOSIS — L570 Actinic keratosis: Secondary | ICD-10-CM | POA: Diagnosis not present

## 2023-03-31 DIAGNOSIS — C44622 Squamous cell carcinoma of skin of right upper limb, including shoulder: Secondary | ICD-10-CM | POA: Diagnosis not present

## 2023-03-31 NOTE — Telephone Encounter (Signed)
Called and left a message that her port/flush appt has been canceled for tomorrow. Arrive at 2:30 pm and the infusion nurse will draw her labs.

## 2023-04-01 ENCOUNTER — Inpatient Hospital Stay: Payer: Medicare HMO

## 2023-04-01 ENCOUNTER — Inpatient Hospital Stay: Payer: Medicare HMO | Attending: Gynecologic Oncology

## 2023-04-01 VITALS — BP 143/60 | HR 69 | Temp 98.3°F | Resp 17 | Wt 134.8 lb

## 2023-04-01 DIAGNOSIS — C541 Malignant neoplasm of endometrium: Secondary | ICD-10-CM | POA: Diagnosis not present

## 2023-04-01 DIAGNOSIS — Z5112 Encounter for antineoplastic immunotherapy: Secondary | ICD-10-CM | POA: Insufficient documentation

## 2023-04-01 DIAGNOSIS — Z7189 Other specified counseling: Secondary | ICD-10-CM

## 2023-04-01 DIAGNOSIS — C773 Secondary and unspecified malignant neoplasm of axilla and upper limb lymph nodes: Secondary | ICD-10-CM | POA: Diagnosis not present

## 2023-04-01 DIAGNOSIS — Z853 Personal history of malignant neoplasm of breast: Secondary | ICD-10-CM | POA: Insufficient documentation

## 2023-04-01 DIAGNOSIS — N183 Chronic kidney disease, stage 3 unspecified: Secondary | ICD-10-CM | POA: Diagnosis not present

## 2023-04-01 LAB — CBC WITH DIFFERENTIAL/PLATELET
Abs Immature Granulocytes: 0.03 10*3/uL (ref 0.00–0.07)
Basophils Absolute: 0.1 10*3/uL (ref 0.0–0.1)
Basophils Relative: 1 %
Eosinophils Absolute: 0.2 10*3/uL (ref 0.0–0.5)
Eosinophils Relative: 3 %
HCT: 31.4 % — ABNORMAL LOW (ref 36.0–46.0)
Hemoglobin: 11.4 g/dL — ABNORMAL LOW (ref 12.0–15.0)
Immature Granulocytes: 0 %
Lymphocytes Relative: 18 %
Lymphs Abs: 1.4 10*3/uL (ref 0.7–4.0)
MCH: 32.2 pg (ref 26.0–34.0)
MCHC: 36.3 g/dL — ABNORMAL HIGH (ref 30.0–36.0)
MCV: 88.7 fL (ref 80.0–100.0)
Monocytes Absolute: 0.7 10*3/uL (ref 0.1–1.0)
Monocytes Relative: 9 %
Neutro Abs: 5.3 10*3/uL (ref 1.7–7.7)
Neutrophils Relative %: 69 %
Platelets: 235 10*3/uL (ref 150–400)
RBC: 3.54 MIL/uL — ABNORMAL LOW (ref 3.87–5.11)
RDW: 11.5 % (ref 11.5–15.5)
WBC: 7.6 10*3/uL (ref 4.0–10.5)
nRBC: 0 % (ref 0.0–0.2)

## 2023-04-01 LAB — COMPREHENSIVE METABOLIC PANEL
ALT: 12 U/L (ref 0–44)
AST: 19 U/L (ref 15–41)
Albumin: 4.1 g/dL (ref 3.5–5.0)
Alkaline Phosphatase: 67 U/L (ref 38–126)
Anion gap: 8 (ref 5–15)
BUN: 23 mg/dL (ref 8–23)
CO2: 28 mmol/L (ref 22–32)
Calcium: 9.3 mg/dL (ref 8.9–10.3)
Chloride: 99 mmol/L (ref 98–111)
Creatinine, Ser: 1.14 mg/dL — ABNORMAL HIGH (ref 0.44–1.00)
GFR, Estimated: 48 mL/min — ABNORMAL LOW (ref >60)
Glucose, Bld: 105 mg/dL — ABNORMAL HIGH (ref 70–99)
Potassium: 3.5 mmol/L (ref 3.5–5.1)
Sodium: 135 mmol/L (ref 135–145)
Total Bilirubin: 0.4 mg/dL (ref 0.3–1.2)
Total Protein: 6.8 g/dL (ref 6.5–8.1)

## 2023-04-01 MED ORDER — HEPARIN SOD (PORK) LOCK FLUSH 100 UNIT/ML IV SOLN
500.0000 [IU] | Freq: Once | INTRAVENOUS | Status: AC | PRN
  Administered 2023-04-01: 500 [IU]

## 2023-04-01 MED ORDER — SODIUM CHLORIDE 0.9 % IV SOLN: Freq: Once | INTRAVENOUS | Status: AC

## 2023-04-01 MED ORDER — TRASTUZUMAB-ANNS CHEMO 150 MG IV SOLR
6.0000 mg/kg | Freq: Once | INTRAVENOUS | Status: AC
  Administered 2023-04-01: 357 mg via INTRAVENOUS
  Filled 2023-04-01: qty 17

## 2023-04-01 MED ORDER — SODIUM CHLORIDE 0.9% FLUSH
10.0000 mL | INTRAVENOUS | Status: DC | PRN
  Administered 2023-04-01: 10 mL

## 2023-04-01 NOTE — Patient Instructions (Signed)
[facility]  Discharge Instructions: Thank you for choosing [facility] to provide your oncology and hematology care.   If you have a lab appointment with the Cancer Center, please go directly to the Cancer Center and check in at the registration area.   Wear comfortable clothing and clothing appropriate for easy access to any Portacath or PICC line.   We strive to give you quality time with your provider. You may need to reschedule your appointment if you arrive late (15 or more minutes).  Arriving late affects you and other patients whose appointments are after yours.  Also, if you miss three or more appointments without notifying the office, you may be dismissed from the clinic at the provider's discretion.      For prescription refill requests, have your pharmacy contact our office and allow 72 hours for refills to be completed.    Today you received the following chemotherapy and/or immunotherapy agents: trastuzumab       To help prevent nausea and vomiting after your treatment, we encourage you to take your nausea medication as directed.  BELOW ARE SYMPTOMS THAT SHOULD BE REPORTED IMMEDIATELY: *FEVER GREATER THAN 100.4 F (38 C) OR HIGHER *CHILLS OR SWEATING *NAUSEA AND VOMITING THAT IS NOT CONTROLLED WITH YOUR NAUSEA MEDICATION *UNUSUAL SHORTNESS OF BREATH *UNUSUAL BRUISING OR BLEEDING *URINARY PROBLEMS (pain or burning when urinating, or frequent urination) *BOWEL PROBLEMS (unusual diarrhea, constipation, pain near the anus) TENDERNESS IN MOUTH AND THROAT WITH OR WITHOUT PRESENCE OF ULCERS (sore throat, sores in mouth, or a toothache) UNUSUAL RASH, SWELLING OR PAIN  UNUSUAL VAGINAL DISCHARGE OR ITCHING   Items with * indicate a potential emergency and should be followed up as soon as possible or go to the Emergency Department if any problems should occur.  Please show the CHEMOTHERAPY ALERT CARD or IMMUNOTHERAPY ALERT CARD at  check-in to the Emergency Department and triage nurse.  Should you have questions after your visit or need to cancel or reschedule your appointment, please contact [facility]  Dept: [phone]  and follow the prompts.  Office hours are 8:00 a.m. to 4:30 p.m. Monday - Friday. Please note that voicemails left after 4:00 p.m. may not be returned until the following business day.  We are closed weekends and major holidays. You have access to a nurse at all times for urgent questions. Please call the main number to the clinic Dept: [phone] and follow the prompts.   For any non-urgent questions, you may also contact your provider using MyChart. We now offer e-Visits for anyone 18 and older to request care online for non-urgent symptoms. For details visit [email or web address].   Also download the MyChart app! Go to the app store, search "MyChart", open the app, select [facility], and log in with your MyChart username and password.   

## 2023-04-21 ENCOUNTER — Telehealth: Payer: Self-pay | Admitting: *Deleted

## 2023-04-21 NOTE — Telephone Encounter (Signed)
Spoke with Vicki Cervantes who called the office to schedule a 6 month follow up with Dr. Pricilla Holm. Patient was given an appt. On Friday, November 1 st at 0830. Pt agreed to date and time and had no further concerns or questions.

## 2023-04-22 ENCOUNTER — Encounter: Payer: Self-pay | Admitting: Hematology and Oncology

## 2023-04-22 ENCOUNTER — Inpatient Hospital Stay: Payer: Medicare HMO | Attending: Gynecologic Oncology | Admitting: Hematology and Oncology

## 2023-04-22 ENCOUNTER — Other Ambulatory Visit: Payer: Self-pay

## 2023-04-22 ENCOUNTER — Inpatient Hospital Stay: Payer: Medicare HMO

## 2023-04-22 VITALS — BP 155/65 | HR 69 | Temp 97.4°F | Resp 18 | Ht 62.0 in | Wt 133.4 lb

## 2023-04-22 DIAGNOSIS — C773 Secondary and unspecified malignant neoplasm of axilla and upper limb lymph nodes: Secondary | ICD-10-CM | POA: Insufficient documentation

## 2023-04-22 DIAGNOSIS — Z5112 Encounter for antineoplastic immunotherapy: Secondary | ICD-10-CM | POA: Insufficient documentation

## 2023-04-22 DIAGNOSIS — C541 Malignant neoplasm of endometrium: Secondary | ICD-10-CM | POA: Diagnosis not present

## 2023-04-22 DIAGNOSIS — N183 Chronic kidney disease, stage 3 unspecified: Secondary | ICD-10-CM

## 2023-04-22 DIAGNOSIS — Z23 Encounter for immunization: Secondary | ICD-10-CM | POA: Insufficient documentation

## 2023-04-22 DIAGNOSIS — Z7189 Other specified counseling: Secondary | ICD-10-CM | POA: Diagnosis not present

## 2023-04-22 DIAGNOSIS — Z79899 Other long term (current) drug therapy: Secondary | ICD-10-CM | POA: Diagnosis not present

## 2023-04-22 DIAGNOSIS — Z7982 Long term (current) use of aspirin: Secondary | ICD-10-CM | POA: Diagnosis not present

## 2023-04-22 LAB — COMPREHENSIVE METABOLIC PANEL
ALT: 10 U/L (ref 0–44)
AST: 17 U/L (ref 15–41)
Albumin: 4.1 g/dL (ref 3.5–5.0)
Alkaline Phosphatase: 63 U/L (ref 38–126)
Anion gap: 9 (ref 5–15)
BUN: 22 mg/dL (ref 8–23)
CO2: 27 mmol/L (ref 22–32)
Calcium: 9.6 mg/dL (ref 8.9–10.3)
Chloride: 101 mmol/L (ref 98–111)
Creatinine, Ser: 1.25 mg/dL — ABNORMAL HIGH (ref 0.44–1.00)
GFR, Estimated: 43 mL/min — ABNORMAL LOW (ref >60)
Glucose, Bld: 100 mg/dL — ABNORMAL HIGH (ref 70–99)
Potassium: 3.7 mmol/L (ref 3.5–5.1)
Sodium: 137 mmol/L (ref 135–145)
Total Bilirubin: 0.4 mg/dL (ref 0.3–1.2)
Total Protein: 6.8 g/dL (ref 6.5–8.1)

## 2023-04-22 LAB — CBC WITH DIFFERENTIAL/PLATELET
Abs Immature Granulocytes: 0.02 10*3/uL (ref 0.00–0.07)
Basophils Absolute: 0.1 10*3/uL (ref 0.0–0.1)
Basophils Relative: 2 %
Eosinophils Absolute: 0.2 10*3/uL (ref 0.0–0.5)
Eosinophils Relative: 4 %
HCT: 31.3 % — ABNORMAL LOW (ref 36.0–46.0)
Hemoglobin: 10.9 g/dL — ABNORMAL LOW (ref 12.0–15.0)
Immature Granulocytes: 0 %
Lymphocytes Relative: 26 %
Lymphs Abs: 1.5 10*3/uL (ref 0.7–4.0)
MCH: 31.3 pg (ref 26.0–34.0)
MCHC: 34.8 g/dL (ref 30.0–36.0)
MCV: 89.9 fL (ref 80.0–100.0)
Monocytes Absolute: 0.6 10*3/uL (ref 0.1–1.0)
Monocytes Relative: 10 %
Neutro Abs: 3.3 10*3/uL (ref 1.7–7.7)
Neutrophils Relative %: 58 %
Platelets: 239 10*3/uL (ref 150–400)
RBC: 3.48 MIL/uL — ABNORMAL LOW (ref 3.87–5.11)
RDW: 11.9 % (ref 11.5–15.5)
WBC: 5.8 10*3/uL (ref 4.0–10.5)
nRBC: 0 % (ref 0.0–0.2)

## 2023-04-22 MED ORDER — SODIUM CHLORIDE 0.9% FLUSH
10.0000 mL | INTRAVENOUS | Status: DC | PRN
  Administered 2023-04-22: 10 mL

## 2023-04-22 MED ORDER — HEPARIN SOD (PORK) LOCK FLUSH 100 UNIT/ML IV SOLN
500.0000 [IU] | Freq: Once | INTRAVENOUS | Status: AC | PRN
  Administered 2023-04-22: 500 [IU]

## 2023-04-22 MED ORDER — SODIUM CHLORIDE 0.9 % IV SOLN: Freq: Once | INTRAVENOUS | Status: AC

## 2023-04-22 MED ORDER — INFLUENZA VAC A&B SURF ANT ADJ 0.5 ML IM SUSY
0.5000 mL | PREFILLED_SYRINGE | Freq: Once | INTRAMUSCULAR | Status: AC
  Administered 2023-04-22: 0.5 mL via INTRAMUSCULAR
  Filled 2023-04-22: qty 0.5

## 2023-04-22 MED ORDER — TRASTUZUMAB-ANNS CHEMO 150 MG IV SOLR
6.0000 mg/kg | Freq: Once | INTRAVENOUS | Status: AC
  Administered 2023-04-22: 357 mg via INTRAVENOUS
  Filled 2023-04-22: qty 17

## 2023-04-22 NOTE — Progress Notes (Signed)
11.20 cm LA Vol (A2C):   49.4 ml 30.56 ml/m  RA Volume:   22.50 ml  13.92 ml/m LA Vol (A4C):   28.3 ml 17.51 ml/m LA Biplane Vol: 38.6 ml 23.88 ml/m  AORTIC VALVE LVOT Vmax:   79.80 cm/s LVOT Vmean:  49.400 cm/s LVOT VTI:    0.173 m AI PHT:      497 msec  AORTA Ao Root diam: 3.20 cm MITRAL VALVE               TRICUSPID VALVE MV Area (PHT): 2.84 cm    TR Peak grad:   12.8 mmHg MV Decel Time: 267 msec    TR Vmax:        179.00 cm/s MV E velocity: 52.40 cm/s MV A velocity: 69.80 cm/s  SHUNTS MV E/A ratio:  0.75        Systemic VTI:  0.17 m                            Systemic Diam: 1.90 cm Donato Schultz MD Electronically signed by Donato Schultz MD Signature Date/Time: 11/05/2022/2:11:35 PM    Final       02/17/2023 Echocardiogram   ECHOCARDIOGRAM COMPLETE  Result Date: 02/17/2023    ECHOCARDIOGRAM REPORT   Patient Name:   Vicki Cervantes Date of Exam: 02/17/2023 Medical Rec #:  [id number]      Height:       62.0 in Accession #:    [id number]     Weight:       137.4 lb Date of Birth:  [birth year]      BSA:          1.630 m Patient Age:    82 years       BP:           143/77  mmHg Patient Gender: F              HR:           71 bpm. Exam Location:  Outpatient Procedure: 2D Echo, Strain Analysis, Color Doppler and Cardiac Doppler Indications:    Chemo  History:        Patient has prior history of Echocardiogram examinations, most                 recent 11/05/2022. Risk Factors:Hypertension.  Referring Phys: [id number] Vennessa Affinito IMPRESSIONS  1. Left ventricular ejection fraction, by estimation, is 60 to 65%. The left ventricle has normal function. The left ventricle has no regional wall motion abnormalities. Left ventricular diastolic parameters are consistent with Grade I diastolic dysfunction (impaired relaxation).  2. Right ventricular systolic function is normal. The right ventricular size is normal.  3. The mitral valve is normal in structure. Trivial mitral valve regurgitation.  4. The aortic valve was not well visualized. Aortic valve regurgitation is not visualized.  5. The inferior vena cava is normal in size with greater than 50% respiratory variability, suggesting right atrial pressure of 3 mmHg. FINDINGS  Left Ventricle: Left ventricular ejection fraction, by estimation, is 60 to 65%. The left ventricle has normal function. The left ventricle has no regional wall motion abnormalities. The left ventricular internal cavity size was normal in size. There is  no left ventricular hypertrophy. Left ventricular diastolic parameters are consistent with Grade I diastolic dysfunction (impaired relaxation). Right Ventricle: The right ventricular size is normal. Right ventricular systolic function is normal. Left Atrium: Left atrial size was  Adrenals/Urinary Tract: The adrenal glands are preserved. No enhancing renal mass. There are some tiny low-attenuation lesion seen along each kidney which are too small to completely characterize likely small cysts. No specific imaging follow-up. Bosniak 2 lesions. Contracted urinary bladder. Stomach/Bowel: Large bowel has extensive left-sided colonic stool.  Extensive colonic diverticulosis as well. The large bowel is nondilated. Small bowel is nondilated. There is a nodular area along the upper aspect of the stomach fold thickening. On series 2, image 45 this measures 18 by 14 mm. This is not well seen previously but on the prior study the stomach is more collapsed. There may have been something in retrospect in this location. There also is some nodular areas along the antrum. Recommend further evaluation to exclude underlying lesion. Vascular/Lymphatic: Normal caliber aorta and IVC with scattered vascular calcifications. No specific abnormal lymph node enlargement identified in the abdomen and pelvis. Reproductive: Status post hysterectomy. No adnexal masses. Other: No free air or free fluid. Musculoskeletal: Moderate degenerative changes of the spine and pelvis. Trace anterolisthesis at L4-5 and L5-S1. 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 IMPRESSION: Stable tiny multiple pulmonary nodules. 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 No developing lymph node enlargement or fluid collection. Colonic diverticulosis of the sigmoid colon with moderate stool. There are nodular masslike areas seen in the stomach including towards the fundus and small area towards the antrum with fold thickening. Hyperplastic polyps are possible. Recommend further workup of the stomach when appropriate to exclude an aggressive process. Electronically Signed   By: Karen Kays M.D.   On: 11/07/2022 13:39   ECHOCARDIOGRAM COMPLETE  Result Date: 11/05/2022    ECHOCARDIOGRAM REPORT   Patient Name:   Vicki Cervantes Date of Exam: 11/05/2022 Medical Rec #:  [id number]      Height:       62.0 in Accession #:    [id number]     Weight:       134.8 lb Date of Birth:  05-13-1941      BSA:          1.617 m Patient Age:    81 years       BP:           144/72 mmHg Patient Gender: F              HR:           69 bpm. Exam Location:  Outpatient Procedure: 2D Echo, 3D Echo,  Cardiac Doppler, Color Doppler and Strain Analysis Indications:    chemo  History:        Patient has prior history of Echocardiogram examinations. Risk                 Factors:Hypertension.  Sonographer:    Mike Gip Referring Phys: [id number] Maximilian Tallo IMPRESSIONS  1. Left ventricular ejection fraction, by estimation, is 55 to 60%. Left ventricular ejection fraction by 3D volume is 59 %. The left ventricle has normal function. The left ventricle has no regional wall motion abnormalities. Left ventricular diastolic  parameters are consistent with Grade I diastolic dysfunction (impaired relaxation). The average left ventricular global longitudinal strain is -16.8 %. The global longitudinal strain is normal.  2. Right ventricular systolic function is normal. The right ventricular size is normal. There is normal pulmonary artery systolic pressure.  3. The mitral valve is normal in structure. No evidence of mitral valve regurgitation. No evidence of mitral stenosis.  4. The aortic valve is normal in structure.  Adrenals/Urinary Tract: The adrenal glands are preserved. No enhancing renal mass. There are some tiny low-attenuation lesion seen along each kidney which are too small to completely characterize likely small cysts. No specific imaging follow-up. Bosniak 2 lesions. Contracted urinary bladder. Stomach/Bowel: Large bowel has extensive left-sided colonic stool.  Extensive colonic diverticulosis as well. The large bowel is nondilated. Small bowel is nondilated. There is a nodular area along the upper aspect of the stomach fold thickening. On series 2, image 45 this measures 18 by 14 mm. This is not well seen previously but on the prior study the stomach is more collapsed. There may have been something in retrospect in this location. There also is some nodular areas along the antrum. Recommend further evaluation to exclude underlying lesion. Vascular/Lymphatic: Normal caliber aorta and IVC with scattered vascular calcifications. No specific abnormal lymph node enlargement identified in the abdomen and pelvis. Reproductive: Status post hysterectomy. No adnexal masses. Other: No free air or free fluid. Musculoskeletal: Moderate degenerative changes of the spine and pelvis. Trace anterolisthesis at L4-5 and L5-S1. 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 IMPRESSION: Stable tiny multiple pulmonary nodules. 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 No developing lymph node enlargement or fluid collection. Colonic diverticulosis of the sigmoid colon with moderate stool. There are nodular masslike areas seen in the stomach including towards the fundus and small area towards the antrum with fold thickening. Hyperplastic polyps are possible. Recommend further workup of the stomach when appropriate to exclude an aggressive process. Electronically Signed   By: Karen Kays M.D.   On: 11/07/2022 13:39   ECHOCARDIOGRAM COMPLETE  Result Date: 11/05/2022    ECHOCARDIOGRAM REPORT   Patient Name:   Vicki Cervantes Date of Exam: 11/05/2022 Medical Rec #:  [id number]      Height:       62.0 in Accession #:    [id number]     Weight:       134.8 lb Date of Birth:  05-13-1941      BSA:          1.617 m Patient Age:    81 years       BP:           144/72 mmHg Patient Gender: F              HR:           69 bpm. Exam Location:  Outpatient Procedure: 2D Echo, 3D Echo,  Cardiac Doppler, Color Doppler and Strain Analysis Indications:    chemo  History:        Patient has prior history of Echocardiogram examinations. Risk                 Factors:Hypertension.  Sonographer:    Mike Gip Referring Phys: [id number] Maximilian Tallo IMPRESSIONS  1. Left ventricular ejection fraction, by estimation, is 55 to 60%. Left ventricular ejection fraction by 3D volume is 59 %. The left ventricle has normal function. The left ventricle has no regional wall motion abnormalities. Left ventricular diastolic  parameters are consistent with Grade I diastolic dysfunction (impaired relaxation). The average left ventricular global longitudinal strain is -16.8 %. The global longitudinal strain is normal.  2. Right ventricular systolic function is normal. The right ventricular size is normal. There is normal pulmonary artery systolic pressure.  3. The mitral valve is normal in structure. No evidence of mitral valve regurgitation. No evidence of mitral stenosis.  4. The aortic valve is normal in structure.  Adrenals/Urinary Tract: The adrenal glands are preserved. No enhancing renal mass. There are some tiny low-attenuation lesion seen along each kidney which are too small to completely characterize likely small cysts. No specific imaging follow-up. Bosniak 2 lesions. Contracted urinary bladder. Stomach/Bowel: Large bowel has extensive left-sided colonic stool.  Extensive colonic diverticulosis as well. The large bowel is nondilated. Small bowel is nondilated. There is a nodular area along the upper aspect of the stomach fold thickening. On series 2, image 45 this measures 18 by 14 mm. This is not well seen previously but on the prior study the stomach is more collapsed. There may have been something in retrospect in this location. There also is some nodular areas along the antrum. Recommend further evaluation to exclude underlying lesion. Vascular/Lymphatic: Normal caliber aorta and IVC with scattered vascular calcifications. No specific abnormal lymph node enlargement identified in the abdomen and pelvis. Reproductive: Status post hysterectomy. No adnexal masses. Other: No free air or free fluid. Musculoskeletal: Moderate degenerative changes of the spine and pelvis. Trace anterolisthesis at L4-5 and L5-S1. 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 IMPRESSION: Stable tiny multiple pulmonary nodules. 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 No developing lymph node enlargement or fluid collection. Colonic diverticulosis of the sigmoid colon with moderate stool. There are nodular masslike areas seen in the stomach including towards the fundus and small area towards the antrum with fold thickening. Hyperplastic polyps are possible. Recommend further workup of the stomach when appropriate to exclude an aggressive process. Electronically Signed   By: Karen Kays M.D.   On: 11/07/2022 13:39   ECHOCARDIOGRAM COMPLETE  Result Date: 11/05/2022    ECHOCARDIOGRAM REPORT   Patient Name:   Vicki Cervantes Date of Exam: 11/05/2022 Medical Rec #:  [id number]      Height:       62.0 in Accession #:    [id number]     Weight:       134.8 lb Date of Birth:  05-13-1941      BSA:          1.617 m Patient Age:    81 years       BP:           144/72 mmHg Patient Gender: F              HR:           69 bpm. Exam Location:  Outpatient Procedure: 2D Echo, 3D Echo,  Cardiac Doppler, Color Doppler and Strain Analysis Indications:    chemo  History:        Patient has prior history of Echocardiogram examinations. Risk                 Factors:Hypertension.  Sonographer:    Mike Gip Referring Phys: [id number] Maximilian Tallo IMPRESSIONS  1. Left ventricular ejection fraction, by estimation, is 55 to 60%. Left ventricular ejection fraction by 3D volume is 59 %. The left ventricle has normal function. The left ventricle has no regional wall motion abnormalities. Left ventricular diastolic  parameters are consistent with Grade I diastolic dysfunction (impaired relaxation). The average left ventricular global longitudinal strain is -16.8 %. The global longitudinal strain is normal.  2. Right ventricular systolic function is normal. The right ventricular size is normal. There is normal pulmonary artery systolic pressure.  3. The mitral valve is normal in structure. No evidence of mitral valve regurgitation. No evidence of mitral stenosis.  4. The aortic valve is normal in structure.  Adrenals/Urinary Tract: The adrenal glands are preserved. No enhancing renal mass. There are some tiny low-attenuation lesion seen along each kidney which are too small to completely characterize likely small cysts. No specific imaging follow-up. Bosniak 2 lesions. Contracted urinary bladder. Stomach/Bowel: Large bowel has extensive left-sided colonic stool.  Extensive colonic diverticulosis as well. The large bowel is nondilated. Small bowel is nondilated. There is a nodular area along the upper aspect of the stomach fold thickening. On series 2, image 45 this measures 18 by 14 mm. This is not well seen previously but on the prior study the stomach is more collapsed. There may have been something in retrospect in this location. There also is some nodular areas along the antrum. Recommend further evaluation to exclude underlying lesion. Vascular/Lymphatic: Normal caliber aorta and IVC with scattered vascular calcifications. No specific abnormal lymph node enlargement identified in the abdomen and pelvis. Reproductive: Status post hysterectomy. No adnexal masses. Other: No free air or free fluid. Musculoskeletal: Moderate degenerative changes of the spine and pelvis. Trace anterolisthesis at L4-5 and L5-S1. 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 IMPRESSION: Stable tiny multiple pulmonary nodules. 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 No developing lymph node enlargement or fluid collection. Colonic diverticulosis of the sigmoid colon with moderate stool. There are nodular masslike areas seen in the stomach including towards the fundus and small area towards the antrum with fold thickening. Hyperplastic polyps are possible. Recommend further workup of the stomach when appropriate to exclude an aggressive process. Electronically Signed   By: Karen Kays M.D.   On: 11/07/2022 13:39   ECHOCARDIOGRAM COMPLETE  Result Date: 11/05/2022    ECHOCARDIOGRAM REPORT   Patient Name:   Vicki Cervantes Date of Exam: 11/05/2022 Medical Rec #:  [id number]      Height:       62.0 in Accession #:    [id number]     Weight:       134.8 lb Date of Birth:  05-13-1941      BSA:          1.617 m Patient Age:    81 years       BP:           144/72 mmHg Patient Gender: F              HR:           69 bpm. Exam Location:  Outpatient Procedure: 2D Echo, 3D Echo,  Cardiac Doppler, Color Doppler and Strain Analysis Indications:    chemo  History:        Patient has prior history of Echocardiogram examinations. Risk                 Factors:Hypertension.  Sonographer:    Mike Gip Referring Phys: [id number] Maximilian Tallo IMPRESSIONS  1. Left ventricular ejection fraction, by estimation, is 55 to 60%. Left ventricular ejection fraction by 3D volume is 59 %. The left ventricle has normal function. The left ventricle has no regional wall motion abnormalities. Left ventricular diastolic  parameters are consistent with Grade I diastolic dysfunction (impaired relaxation). The average left ventricular global longitudinal strain is -16.8 %. The global longitudinal strain is normal.  2. Right ventricular systolic function is normal. The right ventricular size is normal. There is normal pulmonary artery systolic pressure.  3. The mitral valve is normal in structure. No evidence of mitral valve regurgitation. No evidence of mitral stenosis.  4. The aortic valve is normal in structure.  11.20 cm LA Vol (A2C):   49.4 ml 30.56 ml/m  RA Volume:   22.50 ml  13.92 ml/m LA Vol (A4C):   28.3 ml 17.51 ml/m LA Biplane Vol: 38.6 ml 23.88 ml/m  AORTIC VALVE LVOT Vmax:   79.80 cm/s LVOT Vmean:  49.400 cm/s LVOT VTI:    0.173 m AI PHT:      497 msec  AORTA Ao Root diam: 3.20 cm MITRAL VALVE               TRICUSPID VALVE MV Area (PHT): 2.84 cm    TR Peak grad:   12.8 mmHg MV Decel Time: 267 msec    TR Vmax:        179.00 cm/s MV E velocity: 52.40 cm/s MV A velocity: 69.80 cm/s  SHUNTS MV E/A ratio:  0.75        Systemic VTI:  0.17 m                            Systemic Diam: 1.90 cm Donato Schultz MD Electronically signed by Donato Schultz MD Signature Date/Time: 11/05/2022/2:11:35 PM    Final       02/17/2023 Echocardiogram   ECHOCARDIOGRAM COMPLETE  Result Date: 02/17/2023    ECHOCARDIOGRAM REPORT   Patient Name:   Vicki Cervantes Date of Exam: 02/17/2023 Medical Rec #:  [id number]      Height:       62.0 in Accession #:    [id number]     Weight:       137.4 lb Date of Birth:  [birth year]      BSA:          1.630 m Patient Age:    82 years       BP:           143/77  mmHg Patient Gender: F              HR:           71 bpm. Exam Location:  Outpatient Procedure: 2D Echo, Strain Analysis, Color Doppler and Cardiac Doppler Indications:    Chemo  History:        Patient has prior history of Echocardiogram examinations, most                 recent 11/05/2022. Risk Factors:Hypertension.  Referring Phys: [id number] Vennessa Affinito IMPRESSIONS  1. Left ventricular ejection fraction, by estimation, is 60 to 65%. The left ventricle has normal function. The left ventricle has no regional wall motion abnormalities. Left ventricular diastolic parameters are consistent with Grade I diastolic dysfunction (impaired relaxation).  2. Right ventricular systolic function is normal. The right ventricular size is normal.  3. The mitral valve is normal in structure. Trivial mitral valve regurgitation.  4. The aortic valve was not well visualized. Aortic valve regurgitation is not visualized.  5. The inferior vena cava is normal in size with greater than 50% respiratory variability, suggesting right atrial pressure of 3 mmHg. FINDINGS  Left Ventricle: Left ventricular ejection fraction, by estimation, is 60 to 65%. The left ventricle has normal function. The left ventricle has no regional wall motion abnormalities. The left ventricular internal cavity size was normal in size. There is  no left ventricular hypertrophy. Left ventricular diastolic parameters are consistent with Grade I diastolic dysfunction (impaired relaxation). Right Ventricle: The right ventricular size is normal. Right ventricular systolic function is normal. Left Atrium: Left atrial size was  11.20 cm LA Vol (A2C):   49.4 ml 30.56 ml/m  RA Volume:   22.50 ml  13.92 ml/m LA Vol (A4C):   28.3 ml 17.51 ml/m LA Biplane Vol: 38.6 ml 23.88 ml/m  AORTIC VALVE LVOT Vmax:   79.80 cm/s LVOT Vmean:  49.400 cm/s LVOT VTI:    0.173 m AI PHT:      497 msec  AORTA Ao Root diam: 3.20 cm MITRAL VALVE               TRICUSPID VALVE MV Area (PHT): 2.84 cm    TR Peak grad:   12.8 mmHg MV Decel Time: 267 msec    TR Vmax:        179.00 cm/s MV E velocity: 52.40 cm/s MV A velocity: 69.80 cm/s  SHUNTS MV E/A ratio:  0.75        Systemic VTI:  0.17 m                            Systemic Diam: 1.90 cm Donato Schultz MD Electronically signed by Donato Schultz MD Signature Date/Time: 11/05/2022/2:11:35 PM    Final       02/17/2023 Echocardiogram   ECHOCARDIOGRAM COMPLETE  Result Date: 02/17/2023    ECHOCARDIOGRAM REPORT   Patient Name:   Vicki Cervantes Date of Exam: 02/17/2023 Medical Rec #:  [id number]      Height:       62.0 in Accession #:    [id number]     Weight:       137.4 lb Date of Birth:  [birth year]      BSA:          1.630 m Patient Age:    82 years       BP:           143/77  mmHg Patient Gender: F              HR:           71 bpm. Exam Location:  Outpatient Procedure: 2D Echo, Strain Analysis, Color Doppler and Cardiac Doppler Indications:    Chemo  History:        Patient has prior history of Echocardiogram examinations, most                 recent 11/05/2022. Risk Factors:Hypertension.  Referring Phys: [id number] Vennessa Affinito IMPRESSIONS  1. Left ventricular ejection fraction, by estimation, is 60 to 65%. The left ventricle has normal function. The left ventricle has no regional wall motion abnormalities. Left ventricular diastolic parameters are consistent with Grade I diastolic dysfunction (impaired relaxation).  2. Right ventricular systolic function is normal. The right ventricular size is normal.  3. The mitral valve is normal in structure. Trivial mitral valve regurgitation.  4. The aortic valve was not well visualized. Aortic valve regurgitation is not visualized.  5. The inferior vena cava is normal in size with greater than 50% respiratory variability, suggesting right atrial pressure of 3 mmHg. FINDINGS  Left Ventricle: Left ventricular ejection fraction, by estimation, is 60 to 65%. The left ventricle has normal function. The left ventricle has no regional wall motion abnormalities. The left ventricular internal cavity size was normal in size. There is  no left ventricular hypertrophy. Left ventricular diastolic parameters are consistent with Grade I diastolic dysfunction (impaired relaxation). Right Ventricle: The right ventricular size is normal. Right ventricular systolic function is normal. Left Atrium: Left atrial size was  Adrenals/Urinary Tract: The adrenal glands are preserved. No enhancing renal mass. There are some tiny low-attenuation lesion seen along each kidney which are too small to completely characterize likely small cysts. No specific imaging follow-up. Bosniak 2 lesions. Contracted urinary bladder. Stomach/Bowel: Large bowel has extensive left-sided colonic stool.  Extensive colonic diverticulosis as well. The large bowel is nondilated. Small bowel is nondilated. There is a nodular area along the upper aspect of the stomach fold thickening. On series 2, image 45 this measures 18 by 14 mm. This is not well seen previously but on the prior study the stomach is more collapsed. There may have been something in retrospect in this location. There also is some nodular areas along the antrum. Recommend further evaluation to exclude underlying lesion. Vascular/Lymphatic: Normal caliber aorta and IVC with scattered vascular calcifications. No specific abnormal lymph node enlargement identified in the abdomen and pelvis. Reproductive: Status post hysterectomy. No adnexal masses. Other: No free air or free fluid. Musculoskeletal: Moderate degenerative changes of the spine and pelvis. Trace anterolisthesis at L4-5 and L5-S1. 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 IMPRESSION: Stable tiny multiple pulmonary nodules. 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 No developing lymph node enlargement or fluid collection. Colonic diverticulosis of the sigmoid colon with moderate stool. There are nodular masslike areas seen in the stomach including towards the fundus and small area towards the antrum with fold thickening. Hyperplastic polyps are possible. Recommend further workup of the stomach when appropriate to exclude an aggressive process. Electronically Signed   By: Karen Kays M.D.   On: 11/07/2022 13:39   ECHOCARDIOGRAM COMPLETE  Result Date: 11/05/2022    ECHOCARDIOGRAM REPORT   Patient Name:   Vicki Cervantes Date of Exam: 11/05/2022 Medical Rec #:  [id number]      Height:       62.0 in Accession #:    [id number]     Weight:       134.8 lb Date of Birth:  05-13-1941      BSA:          1.617 m Patient Age:    81 years       BP:           144/72 mmHg Patient Gender: F              HR:           69 bpm. Exam Location:  Outpatient Procedure: 2D Echo, 3D Echo,  Cardiac Doppler, Color Doppler and Strain Analysis Indications:    chemo  History:        Patient has prior history of Echocardiogram examinations. Risk                 Factors:Hypertension.  Sonographer:    Mike Gip Referring Phys: [id number] Maximilian Tallo IMPRESSIONS  1. Left ventricular ejection fraction, by estimation, is 55 to 60%. Left ventricular ejection fraction by 3D volume is 59 %. The left ventricle has normal function. The left ventricle has no regional wall motion abnormalities. Left ventricular diastolic  parameters are consistent with Grade I diastolic dysfunction (impaired relaxation). The average left ventricular global longitudinal strain is -16.8 %. The global longitudinal strain is normal.  2. Right ventricular systolic function is normal. The right ventricular size is normal. There is normal pulmonary artery systolic pressure.  3. The mitral valve is normal in structure. No evidence of mitral valve regurgitation. No evidence of mitral stenosis.  4. The aortic valve is normal in structure.  Adrenals/Urinary Tract: The adrenal glands are preserved. No enhancing renal mass. There are some tiny low-attenuation lesion seen along each kidney which are too small to completely characterize likely small cysts. No specific imaging follow-up. Bosniak 2 lesions. Contracted urinary bladder. Stomach/Bowel: Large bowel has extensive left-sided colonic stool.  Extensive colonic diverticulosis as well. The large bowel is nondilated. Small bowel is nondilated. There is a nodular area along the upper aspect of the stomach fold thickening. On series 2, image 45 this measures 18 by 14 mm. This is not well seen previously but on the prior study the stomach is more collapsed. There may have been something in retrospect in this location. There also is some nodular areas along the antrum. Recommend further evaluation to exclude underlying lesion. Vascular/Lymphatic: Normal caliber aorta and IVC with scattered vascular calcifications. No specific abnormal lymph node enlargement identified in the abdomen and pelvis. Reproductive: Status post hysterectomy. No adnexal masses. Other: No free air or free fluid. Musculoskeletal: Moderate degenerative changes of the spine and pelvis. Trace anterolisthesis at L4-5 and L5-S1. 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 IMPRESSION: Stable tiny multiple pulmonary nodules. 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 No developing lymph node enlargement or fluid collection. Colonic diverticulosis of the sigmoid colon with moderate stool. There are nodular masslike areas seen in the stomach including towards the fundus and small area towards the antrum with fold thickening. Hyperplastic polyps are possible. Recommend further workup of the stomach when appropriate to exclude an aggressive process. Electronically Signed   By: Karen Kays M.D.   On: 11/07/2022 13:39   ECHOCARDIOGRAM COMPLETE  Result Date: 11/05/2022    ECHOCARDIOGRAM REPORT   Patient Name:   Vicki Cervantes Date of Exam: 11/05/2022 Medical Rec #:  [id number]      Height:       62.0 in Accession #:    [id number]     Weight:       134.8 lb Date of Birth:  05-13-1941      BSA:          1.617 m Patient Age:    81 years       BP:           144/72 mmHg Patient Gender: F              HR:           69 bpm. Exam Location:  Outpatient Procedure: 2D Echo, 3D Echo,  Cardiac Doppler, Color Doppler and Strain Analysis Indications:    chemo  History:        Patient has prior history of Echocardiogram examinations. Risk                 Factors:Hypertension.  Sonographer:    Mike Gip Referring Phys: [id number] Maximilian Tallo IMPRESSIONS  1. Left ventricular ejection fraction, by estimation, is 55 to 60%. Left ventricular ejection fraction by 3D volume is 59 %. The left ventricle has normal function. The left ventricle has no regional wall motion abnormalities. Left ventricular diastolic  parameters are consistent with Grade I diastolic dysfunction (impaired relaxation). The average left ventricular global longitudinal strain is -16.8 %. The global longitudinal strain is normal.  2. Right ventricular systolic function is normal. The right ventricular size is normal. There is normal pulmonary artery systolic pressure.  3. The mitral valve is normal in structure. No evidence of mitral valve regurgitation. No evidence of mitral stenosis.  4. The aortic valve is normal in structure.  Adrenals/Urinary Tract: The adrenal glands are preserved. No enhancing renal mass. There are some tiny low-attenuation lesion seen along each kidney which are too small to completely characterize likely small cysts. No specific imaging follow-up. Bosniak 2 lesions. Contracted urinary bladder. Stomach/Bowel: Large bowel has extensive left-sided colonic stool.  Extensive colonic diverticulosis as well. The large bowel is nondilated. Small bowel is nondilated. There is a nodular area along the upper aspect of the stomach fold thickening. On series 2, image 45 this measures 18 by 14 mm. This is not well seen previously but on the prior study the stomach is more collapsed. There may have been something in retrospect in this location. There also is some nodular areas along the antrum. Recommend further evaluation to exclude underlying lesion. Vascular/Lymphatic: Normal caliber aorta and IVC with scattered vascular calcifications. No specific abnormal lymph node enlargement identified in the abdomen and pelvis. Reproductive: Status post hysterectomy. No adnexal masses. Other: No free air or free fluid. Musculoskeletal: Moderate degenerative changes of the spine and pelvis. Trace anterolisthesis at L4-5 and L5-S1. 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 IMPRESSION: Stable tiny multiple pulmonary nodules. 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 No developing lymph node enlargement or fluid collection. Colonic diverticulosis of the sigmoid colon with moderate stool. There are nodular masslike areas seen in the stomach including towards the fundus and small area towards the antrum with fold thickening. Hyperplastic polyps are possible. Recommend further workup of the stomach when appropriate to exclude an aggressive process. Electronically Signed   By: Karen Kays M.D.   On: 11/07/2022 13:39   ECHOCARDIOGRAM COMPLETE  Result Date: 11/05/2022    ECHOCARDIOGRAM REPORT   Patient Name:   Vicki Cervantes Date of Exam: 11/05/2022 Medical Rec #:  [id number]      Height:       62.0 in Accession #:    [id number]     Weight:       134.8 lb Date of Birth:  05-13-1941      BSA:          1.617 m Patient Age:    81 years       BP:           144/72 mmHg Patient Gender: F              HR:           69 bpm. Exam Location:  Outpatient Procedure: 2D Echo, 3D Echo,  Cardiac Doppler, Color Doppler and Strain Analysis Indications:    chemo  History:        Patient has prior history of Echocardiogram examinations. Risk                 Factors:Hypertension.  Sonographer:    Mike Gip Referring Phys: [id number] Maximilian Tallo IMPRESSIONS  1. Left ventricular ejection fraction, by estimation, is 55 to 60%. Left ventricular ejection fraction by 3D volume is 59 %. The left ventricle has normal function. The left ventricle has no regional wall motion abnormalities. Left ventricular diastolic  parameters are consistent with Grade I diastolic dysfunction (impaired relaxation). The average left ventricular global longitudinal strain is -16.8 %. The global longitudinal strain is normal.  2. Right ventricular systolic function is normal. The right ventricular size is normal. There is normal pulmonary artery systolic pressure.  3. The mitral valve is normal in structure. No evidence of mitral valve regurgitation. No evidence of mitral stenosis.  4. The aortic valve is normal in structure.  11.20 cm LA Vol (A2C):   49.4 ml 30.56 ml/m  RA Volume:   22.50 ml  13.92 ml/m LA Vol (A4C):   28.3 ml 17.51 ml/m LA Biplane Vol: 38.6 ml 23.88 ml/m  AORTIC VALVE LVOT Vmax:   79.80 cm/s LVOT Vmean:  49.400 cm/s LVOT VTI:    0.173 m AI PHT:      497 msec  AORTA Ao Root diam: 3.20 cm MITRAL VALVE               TRICUSPID VALVE MV Area (PHT): 2.84 cm    TR Peak grad:   12.8 mmHg MV Decel Time: 267 msec    TR Vmax:        179.00 cm/s MV E velocity: 52.40 cm/s MV A velocity: 69.80 cm/s  SHUNTS MV E/A ratio:  0.75        Systemic VTI:  0.17 m                            Systemic Diam: 1.90 cm Donato Schultz MD Electronically signed by Donato Schultz MD Signature Date/Time: 11/05/2022/2:11:35 PM    Final       02/17/2023 Echocardiogram   ECHOCARDIOGRAM COMPLETE  Result Date: 02/17/2023    ECHOCARDIOGRAM REPORT   Patient Name:   Vicki Cervantes Date of Exam: 02/17/2023 Medical Rec #:  [id number]      Height:       62.0 in Accession #:    [id number]     Weight:       137.4 lb Date of Birth:  [birth year]      BSA:          1.630 m Patient Age:    82 years       BP:           143/77  mmHg Patient Gender: F              HR:           71 bpm. Exam Location:  Outpatient Procedure: 2D Echo, Strain Analysis, Color Doppler and Cardiac Doppler Indications:    Chemo  History:        Patient has prior history of Echocardiogram examinations, most                 recent 11/05/2022. Risk Factors:Hypertension.  Referring Phys: [id number] Vennessa Affinito IMPRESSIONS  1. Left ventricular ejection fraction, by estimation, is 60 to 65%. The left ventricle has normal function. The left ventricle has no regional wall motion abnormalities. Left ventricular diastolic parameters are consistent with Grade I diastolic dysfunction (impaired relaxation).  2. Right ventricular systolic function is normal. The right ventricular size is normal.  3. The mitral valve is normal in structure. Trivial mitral valve regurgitation.  4. The aortic valve was not well visualized. Aortic valve regurgitation is not visualized.  5. The inferior vena cava is normal in size with greater than 50% respiratory variability, suggesting right atrial pressure of 3 mmHg. FINDINGS  Left Ventricle: Left ventricular ejection fraction, by estimation, is 60 to 65%. The left ventricle has normal function. The left ventricle has no regional wall motion abnormalities. The left ventricular internal cavity size was normal in size. There is  no left ventricular hypertrophy. Left ventricular diastolic parameters are consistent with Grade I diastolic dysfunction (impaired relaxation). Right Ventricle: The right ventricular size is normal. Right ventricular systolic function is normal. Left Atrium: Left atrial size was  Adrenals/Urinary Tract: The adrenal glands are preserved. No enhancing renal mass. There are some tiny low-attenuation lesion seen along each kidney which are too small to completely characterize likely small cysts. No specific imaging follow-up. Bosniak 2 lesions. Contracted urinary bladder. Stomach/Bowel: Large bowel has extensive left-sided colonic stool.  Extensive colonic diverticulosis as well. The large bowel is nondilated. Small bowel is nondilated. There is a nodular area along the upper aspect of the stomach fold thickening. On series 2, image 45 this measures 18 by 14 mm. This is not well seen previously but on the prior study the stomach is more collapsed. There may have been something in retrospect in this location. There also is some nodular areas along the antrum. Recommend further evaluation to exclude underlying lesion. Vascular/Lymphatic: Normal caliber aorta and IVC with scattered vascular calcifications. No specific abnormal lymph node enlargement identified in the abdomen and pelvis. Reproductive: Status post hysterectomy. No adnexal masses. Other: No free air or free fluid. Musculoskeletal: Moderate degenerative changes of the spine and pelvis. Trace anterolisthesis at L4-5 and L5-S1. 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 IMPRESSION: Stable tiny multiple pulmonary nodules. 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 No developing lymph node enlargement or fluid collection. Colonic diverticulosis of the sigmoid colon with moderate stool. There are nodular masslike areas seen in the stomach including towards the fundus and small area towards the antrum with fold thickening. Hyperplastic polyps are possible. Recommend further workup of the stomach when appropriate to exclude an aggressive process. Electronically Signed   By: Karen Kays M.D.   On: 11/07/2022 13:39   ECHOCARDIOGRAM COMPLETE  Result Date: 11/05/2022    ECHOCARDIOGRAM REPORT   Patient Name:   Vicki Cervantes Date of Exam: 11/05/2022 Medical Rec #:  [id number]      Height:       62.0 in Accession #:    [id number]     Weight:       134.8 lb Date of Birth:  05-13-1941      BSA:          1.617 m Patient Age:    81 years       BP:           144/72 mmHg Patient Gender: F              HR:           69 bpm. Exam Location:  Outpatient Procedure: 2D Echo, 3D Echo,  Cardiac Doppler, Color Doppler and Strain Analysis Indications:    chemo  History:        Patient has prior history of Echocardiogram examinations. Risk                 Factors:Hypertension.  Sonographer:    Mike Gip Referring Phys: [id number] Maximilian Tallo IMPRESSIONS  1. Left ventricular ejection fraction, by estimation, is 55 to 60%. Left ventricular ejection fraction by 3D volume is 59 %. The left ventricle has normal function. The left ventricle has no regional wall motion abnormalities. Left ventricular diastolic  parameters are consistent with Grade I diastolic dysfunction (impaired relaxation). The average left ventricular global longitudinal strain is -16.8 %. The global longitudinal strain is normal.  2. Right ventricular systolic function is normal. The right ventricular size is normal. There is normal pulmonary artery systolic pressure.  3. The mitral valve is normal in structure. No evidence of mitral valve regurgitation. No evidence of mitral stenosis.  4. The aortic valve is normal in structure.  Adrenals/Urinary Tract: The adrenal glands are preserved. No enhancing renal mass. There are some tiny low-attenuation lesion seen along each kidney which are too small to completely characterize likely small cysts. No specific imaging follow-up. Bosniak 2 lesions. Contracted urinary bladder. Stomach/Bowel: Large bowel has extensive left-sided colonic stool.  Extensive colonic diverticulosis as well. The large bowel is nondilated. Small bowel is nondilated. There is a nodular area along the upper aspect of the stomach fold thickening. On series 2, image 45 this measures 18 by 14 mm. This is not well seen previously but on the prior study the stomach is more collapsed. There may have been something in retrospect in this location. There also is some nodular areas along the antrum. Recommend further evaluation to exclude underlying lesion. Vascular/Lymphatic: Normal caliber aorta and IVC with scattered vascular calcifications. No specific abnormal lymph node enlargement identified in the abdomen and pelvis. Reproductive: Status post hysterectomy. No adnexal masses. Other: No free air or free fluid. Musculoskeletal: Moderate degenerative changes of the spine and pelvis. Trace anterolisthesis at L4-5 and L5-S1. 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 IMPRESSION: Stable tiny multiple pulmonary nodules. 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 No developing lymph node enlargement or fluid collection. Colonic diverticulosis of the sigmoid colon with moderate stool. There are nodular masslike areas seen in the stomach including towards the fundus and small area towards the antrum with fold thickening. Hyperplastic polyps are possible. Recommend further workup of the stomach when appropriate to exclude an aggressive process. Electronically Signed   By: Karen Kays M.D.   On: 11/07/2022 13:39   ECHOCARDIOGRAM COMPLETE  Result Date: 11/05/2022    ECHOCARDIOGRAM REPORT   Patient Name:   Vicki Cervantes Date of Exam: 11/05/2022 Medical Rec #:  [id number]      Height:       62.0 in Accession #:    [id number]     Weight:       134.8 lb Date of Birth:  05-13-1941      BSA:          1.617 m Patient Age:    81 years       BP:           144/72 mmHg Patient Gender: F              HR:           69 bpm. Exam Location:  Outpatient Procedure: 2D Echo, 3D Echo,  Cardiac Doppler, Color Doppler and Strain Analysis Indications:    chemo  History:        Patient has prior history of Echocardiogram examinations. Risk                 Factors:Hypertension.  Sonographer:    Mike Gip Referring Phys: [id number] Maximilian Tallo IMPRESSIONS  1. Left ventricular ejection fraction, by estimation, is 55 to 60%. Left ventricular ejection fraction by 3D volume is 59 %. The left ventricle has normal function. The left ventricle has no regional wall motion abnormalities. Left ventricular diastolic  parameters are consistent with Grade I diastolic dysfunction (impaired relaxation). The average left ventricular global longitudinal strain is -16.8 %. The global longitudinal strain is normal.  2. Right ventricular systolic function is normal. The right ventricular size is normal. There is normal pulmonary artery systolic pressure.  3. The mitral valve is normal in structure. No evidence of mitral valve regurgitation. No evidence of mitral stenosis.  4. The aortic valve is normal in structure.  Adrenals/Urinary Tract: The adrenal glands are preserved. No enhancing renal mass. There are some tiny low-attenuation lesion seen along each kidney which are too small to completely characterize likely small cysts. No specific imaging follow-up. Bosniak 2 lesions. Contracted urinary bladder. Stomach/Bowel: Large bowel has extensive left-sided colonic stool.  Extensive colonic diverticulosis as well. The large bowel is nondilated. Small bowel is nondilated. There is a nodular area along the upper aspect of the stomach fold thickening. On series 2, image 45 this measures 18 by 14 mm. This is not well seen previously but on the prior study the stomach is more collapsed. There may have been something in retrospect in this location. There also is some nodular areas along the antrum. Recommend further evaluation to exclude underlying lesion. Vascular/Lymphatic: Normal caliber aorta and IVC with scattered vascular calcifications. No specific abnormal lymph node enlargement identified in the abdomen and pelvis. Reproductive: Status post hysterectomy. No adnexal masses. Other: No free air or free fluid. Musculoskeletal: Moderate degenerative changes of the spine and pelvis. Trace anterolisthesis at L4-5 and L5-S1. 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 IMPRESSION: Stable tiny multiple pulmonary nodules. 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 No developing lymph node enlargement or fluid collection. Colonic diverticulosis of the sigmoid colon with moderate stool. There are nodular masslike areas seen in the stomach including towards the fundus and small area towards the antrum with fold thickening. Hyperplastic polyps are possible. Recommend further workup of the stomach when appropriate to exclude an aggressive process. Electronically Signed   By: Karen Kays M.D.   On: 11/07/2022 13:39   ECHOCARDIOGRAM COMPLETE  Result Date: 11/05/2022    ECHOCARDIOGRAM REPORT   Patient Name:   Vicki Cervantes Date of Exam: 11/05/2022 Medical Rec #:  [id number]      Height:       62.0 in Accession #:    [id number]     Weight:       134.8 lb Date of Birth:  05-13-1941      BSA:          1.617 m Patient Age:    81 years       BP:           144/72 mmHg Patient Gender: F              HR:           69 bpm. Exam Location:  Outpatient Procedure: 2D Echo, 3D Echo,  Cardiac Doppler, Color Doppler and Strain Analysis Indications:    chemo  History:        Patient has prior history of Echocardiogram examinations. Risk                 Factors:Hypertension.  Sonographer:    Mike Gip Referring Phys: [id number] Maximilian Tallo IMPRESSIONS  1. Left ventricular ejection fraction, by estimation, is 55 to 60%. Left ventricular ejection fraction by 3D volume is 59 %. The left ventricle has normal function. The left ventricle has no regional wall motion abnormalities. Left ventricular diastolic  parameters are consistent with Grade I diastolic dysfunction (impaired relaxation). The average left ventricular global longitudinal strain is -16.8 %. The global longitudinal strain is normal.  2. Right ventricular systolic function is normal. The right ventricular size is normal. There is normal pulmonary artery systolic pressure.  3. The mitral valve is normal in structure. No evidence of mitral valve regurgitation. No evidence of mitral stenosis.  4. The aortic valve is normal in structure.  Adrenals/Urinary Tract: The adrenal glands are preserved. No enhancing renal mass. There are some tiny low-attenuation lesion seen along each kidney which are too small to completely characterize likely small cysts. No specific imaging follow-up. Bosniak 2 lesions. Contracted urinary bladder. Stomach/Bowel: Large bowel has extensive left-sided colonic stool.  Extensive colonic diverticulosis as well. The large bowel is nondilated. Small bowel is nondilated. There is a nodular area along the upper aspect of the stomach fold thickening. On series 2, image 45 this measures 18 by 14 mm. This is not well seen previously but on the prior study the stomach is more collapsed. There may have been something in retrospect in this location. There also is some nodular areas along the antrum. Recommend further evaluation to exclude underlying lesion. Vascular/Lymphatic: Normal caliber aorta and IVC with scattered vascular calcifications. No specific abnormal lymph node enlargement identified in the abdomen and pelvis. Reproductive: Status post hysterectomy. No adnexal masses. Other: No free air or free fluid. Musculoskeletal: Moderate degenerative changes of the spine and pelvis. Trace anterolisthesis at L4-5 and L5-S1. 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 IMPRESSION: Stable tiny multiple pulmonary nodules. 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 No developing lymph node enlargement or fluid collection. Colonic diverticulosis of the sigmoid colon with moderate stool. There are nodular masslike areas seen in the stomach including towards the fundus and small area towards the antrum with fold thickening. Hyperplastic polyps are possible. Recommend further workup of the stomach when appropriate to exclude an aggressive process. Electronically Signed   By: Karen Kays M.D.   On: 11/07/2022 13:39   ECHOCARDIOGRAM COMPLETE  Result Date: 11/05/2022    ECHOCARDIOGRAM REPORT   Patient Name:   Vicki Cervantes Date of Exam: 11/05/2022 Medical Rec #:  [id number]      Height:       62.0 in Accession #:    [id number]     Weight:       134.8 lb Date of Birth:  05-13-1941      BSA:          1.617 m Patient Age:    81 years       BP:           144/72 mmHg Patient Gender: F              HR:           69 bpm. Exam Location:  Outpatient Procedure: 2D Echo, 3D Echo,  Cardiac Doppler, Color Doppler and Strain Analysis Indications:    chemo  History:        Patient has prior history of Echocardiogram examinations. Risk                 Factors:Hypertension.  Sonographer:    Mike Gip Referring Phys: [id number] Maximilian Tallo IMPRESSIONS  1. Left ventricular ejection fraction, by estimation, is 55 to 60%. Left ventricular ejection fraction by 3D volume is 59 %. The left ventricle has normal function. The left ventricle has no regional wall motion abnormalities. Left ventricular diastolic  parameters are consistent with Grade I diastolic dysfunction (impaired relaxation). The average left ventricular global longitudinal strain is -16.8 %. The global longitudinal strain is normal.  2. Right ventricular systolic function is normal. The right ventricular size is normal. There is normal pulmonary artery systolic pressure.  3. The mitral valve is normal in structure. No evidence of mitral valve regurgitation. No evidence of mitral stenosis.  4. The aortic valve is normal in structure.

## 2023-04-22 NOTE — Patient Instructions (Signed)
[facility]  Discharge Instructions: Thank you for choosing [facility] to provide your oncology and hematology care.   If you have a lab appointment with the Cancer Center, please go directly to the Cancer Center and check in at the registration area.   Wear comfortable clothing and clothing appropriate for easy access to any Portacath or PICC line.   We strive to give you quality time with your provider. You may need to reschedule your appointment if you arrive late (15 or more minutes).  Arriving late affects you and other patients whose appointments are after yours.  Also, if you miss three or more appointments without notifying the office, you may be dismissed from the clinic at the provider's discretion.      For prescription refill requests, have your pharmacy contact our office and allow 72 hours for refills to be completed.    Today you received the following chemotherapy and/or immunotherapy agents: trastuzumab       To help prevent nausea and vomiting after your treatment, we encourage you to take your nausea medication as directed.  BELOW ARE SYMPTOMS THAT SHOULD BE REPORTED IMMEDIATELY: *FEVER GREATER THAN 100.4 F (38 C) OR HIGHER *CHILLS OR SWEATING *NAUSEA AND VOMITING THAT IS NOT CONTROLLED WITH YOUR NAUSEA MEDICATION *UNUSUAL SHORTNESS OF BREATH *UNUSUAL BRUISING OR BLEEDING *URINARY PROBLEMS (pain or burning when urinating, or frequent urination) *BOWEL PROBLEMS (unusual diarrhea, constipation, pain near the anus) TENDERNESS IN MOUTH AND THROAT WITH OR WITHOUT PRESENCE OF ULCERS (sore throat, sores in mouth, or a toothache) UNUSUAL RASH, SWELLING OR PAIN  UNUSUAL VAGINAL DISCHARGE OR ITCHING   Items with * indicate a potential emergency and should be followed up as soon as possible or go to the Emergency Department if any problems should occur.  Please show the CHEMOTHERAPY ALERT CARD or IMMUNOTHERAPY ALERT CARD at  check-in to the Emergency Department and triage nurse.  Should you have questions after your visit or need to cancel or reschedule your appointment, please contact [facility]  Dept: [phone]  and follow the prompts.  Office hours are 8:00 a.m. to 4:30 p.m. Monday - Friday. Please note that voicemails left after 4:00 p.m. may not be returned until the following business day.  We are closed weekends and major holidays. You have access to a nurse at all times for urgent questions. Please call the main number to the clinic Dept: [phone] and follow the prompts.   For any non-urgent questions, you may also contact your provider using MyChart. We now offer e-Visits for anyone 18 and older to request care online for non-urgent symptoms. For details visit [email or web address].   Also download the MyChart app! Go to the app store, search "MyChart", open the app, select [facility], and log in with your MyChart username and password.   

## 2023-04-23 ENCOUNTER — Other Ambulatory Visit: Payer: Self-pay

## 2023-04-24 ENCOUNTER — Other Ambulatory Visit: Payer: Self-pay

## 2023-05-09 ENCOUNTER — Other Ambulatory Visit: Payer: Self-pay | Admitting: Medical Genetics

## 2023-05-09 DIAGNOSIS — Z006 Encounter for examination for normal comparison and control in clinical research program: Secondary | ICD-10-CM

## 2023-05-12 ENCOUNTER — Telehealth: Payer: Self-pay | Admitting: *Deleted

## 2023-05-12 NOTE — Telephone Encounter (Signed)
Spoke with Vicki Cervantes who called the office to reschedule her appt. On Friday, November 1st with Dr. Pricilla Holm because she is out of town taking care of her daughter in law who had surgery. Pt was given an appt. On Friday, January 3 rd at 3pm. Pt agreed to date and time.

## 2023-05-13 ENCOUNTER — Other Ambulatory Visit: Payer: Self-pay

## 2023-05-16 ENCOUNTER — Ambulatory Visit: Payer: Medicare HMO | Admitting: Gynecologic Oncology

## 2023-05-20 ENCOUNTER — Inpatient Hospital Stay: Payer: Medicare HMO | Attending: Gynecologic Oncology

## 2023-05-20 ENCOUNTER — Inpatient Hospital Stay: Payer: Medicare HMO

## 2023-05-20 VITALS — BP 155/54 | HR 71 | Temp 98.1°F | Resp 16 | Ht 62.0 in | Wt 133.8 lb

## 2023-05-20 DIAGNOSIS — Z7189 Other specified counseling: Secondary | ICD-10-CM

## 2023-05-20 DIAGNOSIS — C773 Secondary and unspecified malignant neoplasm of axilla and upper limb lymph nodes: Secondary | ICD-10-CM

## 2023-05-20 DIAGNOSIS — C541 Malignant neoplasm of endometrium: Secondary | ICD-10-CM

## 2023-05-20 DIAGNOSIS — Z5112 Encounter for antineoplastic immunotherapy: Secondary | ICD-10-CM | POA: Diagnosis not present

## 2023-05-20 DIAGNOSIS — N183 Chronic kidney disease, stage 3 unspecified: Secondary | ICD-10-CM

## 2023-05-20 LAB — CBC WITH DIFFERENTIAL/PLATELET
Abs Immature Granulocytes: 0.02 10*3/uL (ref 0.00–0.07)
Basophils Absolute: 0.1 10*3/uL (ref 0.0–0.1)
Basophils Relative: 1 %
Eosinophils Absolute: 0.3 10*3/uL (ref 0.0–0.5)
Eosinophils Relative: 4 %
HCT: 31 % — ABNORMAL LOW (ref 36.0–46.0)
Hemoglobin: 10.8 g/dL — ABNORMAL LOW (ref 12.0–15.0)
Immature Granulocytes: 0 %
Lymphocytes Relative: 23 %
Lymphs Abs: 1.5 10*3/uL (ref 0.7–4.0)
MCH: 31.3 pg (ref 26.0–34.0)
MCHC: 34.8 g/dL (ref 30.0–36.0)
MCV: 89.9 fL (ref 80.0–100.0)
Monocytes Absolute: 0.6 10*3/uL (ref 0.1–1.0)
Monocytes Relative: 9 %
Neutro Abs: 4 10*3/uL (ref 1.7–7.7)
Neutrophils Relative %: 63 %
Platelets: 229 10*3/uL (ref 150–400)
RBC: 3.45 MIL/uL — ABNORMAL LOW (ref 3.87–5.11)
RDW: 11.9 % (ref 11.5–15.5)
WBC: 6.4 10*3/uL (ref 4.0–10.5)
nRBC: 0 % (ref 0.0–0.2)

## 2023-05-20 LAB — COMPREHENSIVE METABOLIC PANEL
ALT: 9 U/L (ref 0–44)
AST: 15 U/L (ref 15–41)
Albumin: 4.1 g/dL (ref 3.5–5.0)
Alkaline Phosphatase: 66 U/L (ref 38–126)
Anion gap: 9 (ref 5–15)
BUN: 32 mg/dL — ABNORMAL HIGH (ref 8–23)
CO2: 27 mmol/L (ref 22–32)
Calcium: 9.3 mg/dL (ref 8.9–10.3)
Chloride: 102 mmol/L (ref 98–111)
Creatinine, Ser: 1.31 mg/dL — ABNORMAL HIGH (ref 0.44–1.00)
GFR, Estimated: 40 mL/min — ABNORMAL LOW (ref >60)
Glucose, Bld: 102 mg/dL — ABNORMAL HIGH (ref 70–99)
Potassium: 3.6 mmol/L (ref 3.5–5.1)
Sodium: 138 mmol/L (ref 135–145)
Total Bilirubin: 0.5 mg/dL (ref <1.2)
Total Protein: 6.8 g/dL (ref 6.5–8.1)

## 2023-05-20 MED ORDER — HEPARIN SOD (PORK) LOCK FLUSH 100 UNIT/ML IV SOLN
500.0000 [IU] | Freq: Once | INTRAVENOUS | Status: AC | PRN
  Administered 2023-05-20: 500 [IU]

## 2023-05-20 MED ORDER — SODIUM CHLORIDE 0.9% FLUSH
10.0000 mL | INTRAVENOUS | Status: DC | PRN
  Administered 2023-05-20: 10 mL

## 2023-05-20 MED ORDER — SODIUM CHLORIDE 0.9 % IV SOLN: Freq: Once | INTRAVENOUS | Status: AC

## 2023-05-20 MED ORDER — TRASTUZUMAB-ANNS CHEMO 150 MG IV SOLR
6.0000 mg/kg | Freq: Once | INTRAVENOUS | Status: AC
  Administered 2023-05-20: 357 mg via INTRAVENOUS
  Filled 2023-05-20: qty 17

## 2023-05-20 NOTE — Patient Instructions (Signed)
[facility] - A DEPT OF [facility]   Discharge Instructions: Thank you for choosing [facility] to provide your oncology and hematology care.   If you have a lab appointment with the Cancer Center, please go directly to the Cancer Center and check in at the registration area.   Wear comfortable clothing and clothing appropriate for easy access to any Portacath or PICC line.   We strive to give you quality time with your provider. You may need to reschedule your appointment if you arrive late (15 or more minutes).  Arriving late affects you and other patients whose appointments are after yours.  Also, if you miss three or more appointments without notifying the office, you may be dismissed from the clinic at the provider's discretion.      For prescription refill requests, have your pharmacy contact our office and allow 72 hours for refills to be completed.    Today you received the following chemotherapy and/or immunotherapy agents: Trastuzumab (Ogivri)      To help prevent nausea and vomiting after your treatment, we encourage you to take your nausea medication as directed.  BELOW ARE SYMPTOMS THAT SHOULD BE REPORTED IMMEDIATELY: *FEVER GREATER THAN 100.4 F (38 C) OR HIGHER *CHILLS OR SWEATING *NAUSEA AND VOMITING THAT IS NOT CONTROLLED WITH YOUR NAUSEA MEDICATION *UNUSUAL SHORTNESS OF BREATH *UNUSUAL BRUISING OR BLEEDING *URINARY PROBLEMS (pain or burning when urinating, or frequent urination) *BOWEL PROBLEMS (unusual diarrhea, constipation, pain near the anus) TENDERNESS IN MOUTH AND THROAT WITH OR WITHOUT PRESENCE OF ULCERS (sore throat, sores in mouth, or a toothache) UNUSUAL RASH, SWELLING OR PAIN  UNUSUAL VAGINAL DISCHARGE OR ITCHING   Items with * indicate a potential emergency and should be followed up as soon as possible or go to the Emergency Department if any problems should occur.  Please show the CHEMOTHERAPY ALERT CARD or  IMMUNOTHERAPY ALERT CARD at check-in to the Emergency Department and triage nurse.  Should you have questions after your visit or need to cancel or reschedule your appointment, please contact [facility] - A DEPT OF [facility]  Dept: [phone]  and follow the prompts.  Office hours are 8:00 a.m. to 4:30 p.m. Monday - Friday. Please note that voicemails left after 4:00 p.m. may not be returned until the following business day.  We are closed weekends and major holidays. You have access to a nurse at all times for urgent questions. Please call the main number to the clinic Dept: [phone] and follow the prompts.   For any non-urgent questions, you may also contact your provider using MyChart. We now offer e-Visits for anyone 67 and older to request care online for non-urgent symptoms. For details visit [email or web address].   Also download the MyChart app! Go to the app store, search "MyChart", open the app, select , and log in with your MyChart username and password.

## 2023-05-21 ENCOUNTER — Other Ambulatory Visit (HOSPITAL_COMMUNITY)
Admission: RE | Admit: 2023-05-21 | Discharge: 2023-05-21 | Disposition: A | Discharge status: Home / Self Care | Payer: Medicare HMO | Source: Ambulatory Visit | Attending: Physician Assistant | Admitting: Physician Assistant

## 2023-05-21 DIAGNOSIS — Z006 Encounter for examination for normal comparison and control in clinical research program: Secondary | ICD-10-CM | POA: Insufficient documentation

## 2023-06-03 LAB — HELIX MOLECULAR SCREEN: Genetic Analysis Overall Interpretation: NEGATIVE

## 2023-06-03 LAB — GENECONNECT MOLECULAR SCREEN

## 2023-06-10 ENCOUNTER — Inpatient Hospital Stay: Payer: Medicare HMO

## 2023-06-10 ENCOUNTER — Encounter: Payer: Self-pay | Admitting: Hematology and Oncology

## 2023-06-10 ENCOUNTER — Ambulatory Visit: Payer: Medicare HMO

## 2023-06-10 ENCOUNTER — Other Ambulatory Visit: Payer: Medicare HMO

## 2023-06-10 ENCOUNTER — Inpatient Hospital Stay: Payer: Medicare HMO | Admitting: Hematology and Oncology

## 2023-06-10 VITALS — BP 158/72 | HR 64 | Temp 97.8°F | Resp 16

## 2023-06-10 VITALS — BP 178/63 | HR 66 | Temp 98.5°F | Resp 18 | Ht 62.0 in | Wt 135.2 lb

## 2023-06-10 DIAGNOSIS — Z5112 Encounter for antineoplastic immunotherapy: Secondary | ICD-10-CM | POA: Diagnosis not present

## 2023-06-10 DIAGNOSIS — N183 Chronic kidney disease, stage 3 unspecified: Secondary | ICD-10-CM

## 2023-06-10 DIAGNOSIS — Z7189 Other specified counseling: Secondary | ICD-10-CM

## 2023-06-10 DIAGNOSIS — C773 Secondary and unspecified malignant neoplasm of axilla and upper limb lymph nodes: Secondary | ICD-10-CM | POA: Diagnosis not present

## 2023-06-10 DIAGNOSIS — C541 Malignant neoplasm of endometrium: Secondary | ICD-10-CM

## 2023-06-10 LAB — COMPREHENSIVE METABOLIC PANEL
ALT: 10 U/L (ref 0–44)
AST: 18 U/L (ref 15–41)
Albumin: 4 g/dL (ref 3.5–5.0)
Alkaline Phosphatase: 62 U/L (ref 38–126)
Anion gap: 8 (ref 5–15)
BUN: 20 mg/dL (ref 8–23)
CO2: 28 mmol/L (ref 22–32)
Calcium: 8.8 mg/dL — ABNORMAL LOW (ref 8.9–10.3)
Chloride: 100 mmol/L (ref 98–111)
Creatinine, Ser: 1.25 mg/dL — ABNORMAL HIGH (ref 0.44–1.00)
GFR, Estimated: 43 mL/min — ABNORMAL LOW (ref >60)
Glucose, Bld: 99 mg/dL (ref 70–99)
Potassium: 3.7 mmol/L (ref 3.5–5.1)
Sodium: 136 mmol/L (ref 135–145)
Total Bilirubin: 0.5 mg/dL (ref <1.2)
Total Protein: 6.8 g/dL (ref 6.5–8.1)

## 2023-06-10 LAB — CBC WITH DIFFERENTIAL/PLATELET
Abs Immature Granulocytes: 0.03 10*3/uL (ref 0.00–0.07)
Basophils Absolute: 0.1 10*3/uL (ref 0.0–0.1)
Basophils Relative: 1 %
Eosinophils Absolute: 0.2 10*3/uL (ref 0.0–0.5)
Eosinophils Relative: 4 %
HCT: 30.6 % — ABNORMAL LOW (ref 36.0–46.0)
Hemoglobin: 10.9 g/dL — ABNORMAL LOW (ref 12.0–15.0)
Immature Granulocytes: 1 %
Lymphocytes Relative: 26 %
Lymphs Abs: 1.5 10*3/uL (ref 0.7–4.0)
MCH: 31.7 pg (ref 26.0–34.0)
MCHC: 35.6 g/dL (ref 30.0–36.0)
MCV: 89 fL (ref 80.0–100.0)
Monocytes Absolute: 0.6 10*3/uL (ref 0.1–1.0)
Monocytes Relative: 10 %
Neutro Abs: 3.5 10*3/uL (ref 1.7–7.7)
Neutrophils Relative %: 58 %
Platelets: 251 10*3/uL (ref 150–400)
RBC: 3.44 MIL/uL — ABNORMAL LOW (ref 3.87–5.11)
RDW: 11.9 % (ref 11.5–15.5)
WBC: 6 10*3/uL (ref 4.0–10.5)
nRBC: 0 % (ref 0.0–0.2)

## 2023-06-10 MED ORDER — HEPARIN SOD (PORK) LOCK FLUSH 100 UNIT/ML IV SOLN
500.0000 [IU] | Freq: Once | INTRAVENOUS | Status: AC | PRN
  Administered 2023-06-10: 500 [IU]

## 2023-06-10 MED ORDER — TRASTUZUMAB-ANNS CHEMO 150 MG IV SOLR
6.0000 mg/kg | Freq: Once | INTRAVENOUS | Status: AC
  Administered 2023-06-10: 357 mg via INTRAVENOUS
  Filled 2023-06-10: qty 17

## 2023-06-10 MED ORDER — SODIUM CHLORIDE 0.9% FLUSH
10.0000 mL | INTRAVENOUS | Status: DC | PRN
  Administered 2023-06-10: 10 mL

## 2023-06-10 MED ORDER — SODIUM CHLORIDE 0.9 % IV SOLN
Freq: Once | INTRAVENOUS | Status: AC
Start: 2023-06-10 — End: 2023-06-10

## 2023-06-10 NOTE — Patient Instructions (Signed)
[facility] - [facility]  Discharge Instructions: Thank you for choosing [facility] to provide your oncology and hematology care.   If you have a lab appointment with the Cancer Center, please go directly to the Cancer Center and check in at the registration area.   Wear comfortable clothing and clothing appropriate for easy access to any Portacath or PICC line.   We strive to give you quality time with your provider. You may need to reschedule your appointment if you arrive late (15 or more minutes).  Arriving late affects you and other patients whose appointments are after yours.  Also, if you miss three or more appointments without notifying the office, you may be dismissed from the clinic at the provider's discretion.      For prescription refill requests, have your pharmacy contact our office and allow 72 hours for refills to be completed.    Today you received the following chemotherapy and/or immunotherapy agents: Trastuzumab (Kanjinti)      To help prevent nausea and vomiting after your treatment, we encourage you to take your nausea medication as directed.  BELOW ARE SYMPTOMS THAT SHOULD BE REPORTED IMMEDIATELY: *FEVER GREATER THAN 100.4 F (38 C) OR HIGHER *CHILLS OR SWEATING *NAUSEA AND VOMITING THAT IS NOT CONTROLLED WITH YOUR NAUSEA MEDICATION *UNUSUAL SHORTNESS OF BREATH *UNUSUAL BRUISING OR BLEEDING *URINARY PROBLEMS (pain or burning when urinating, or frequent urination) *BOWEL PROBLEMS (unusual diarrhea, constipation, pain near the anus) TENDERNESS IN MOUTH AND THROAT WITH OR WITHOUT PRESENCE OF ULCERS (sore throat, sores in mouth, or a toothache) UNUSUAL RASH, SWELLING OR PAIN  UNUSUAL VAGINAL DISCHARGE OR ITCHING   Items with * indicate a potential emergency and should be followed up as soon as possible or go to the Emergency Department if any problems should occur.  Please show the CHEMOTHERAPY ALERT CARD or  IMMUNOTHERAPY ALERT CARD at check-in to the Emergency Department and triage nurse.  Should you have questions after your visit or need to cancel or reschedule your appointment, please contact [facility] - A DEPT OF [facility]  Dept: [phone]  and follow the prompts.  Office hours are 8:00 a.m. to 4:30 p.m. Monday - Friday. Please note that voicemails left after 4:00 p.m. may not be returned until the following business day.  We are closed weekends and major holidays. You have access to a nurse at all times for urgent questions. Please call the main number to the clinic Dept: [phone] and follow the prompts.   For any non-urgent questions, you may also contact your provider using MyChart. We now offer e-Visits for anyone 59 and older to request care online for non-urgent symptoms. For details visit [email or web address].   Also download the MyChart app! Go to the app store, search "MyChart", open the app, select [place], and log in with your MyChart username and password.

## 2023-06-10 NOTE — Progress Notes (Signed)
[facility] OFFICE PROGRESS NOTE  Patient Care Team: Geoffry Paradise, [name]  ASSESSMENT & PLAN:  Endometrial cancer [facility]) Her last imaging showed no evidence of cancer recurrence, due in April 2025 Echocardiogram was normal, due next month She will continue trastuzumab indefinitely   CKD (chronic kidney disease), stage III (HCC) Renal function is stable. Monitor closely every other visit  Orders Placed This Encounter  Procedures   ECHOCARDIOGRAM COMPLETE    Standing Status:   Future    Standing Expiration Date:   06/09/2024    Order Specific Question:   Where should this test be performed    Answer:   [facility]    Order Specific Question:   Perflutren DEFINITY (image enhancing agent) should be administered unless hypersensitivity or allergy exist    Answer:   Administer Perflutren    Order Specific Question:   Reason for exam-Echo    Answer:   Chemo  [id number]    All questions were answered. The patient knows to call the clinic with any problems, questions or concerns. The total time spent in the appointment was 25 minutes encounter with patients including review of chart and various tests results, discussions about plan of care and coordination of care plan   Artis Delay, MD 06/10/2023 10:26 AM  INTERVAL HISTORY: Please see below for problem oriented charting. she returns for treatment follow-up She has no signs or symptoms of congestive heart failure or other side effects from treatment No signs or symptoms of disease progression Her blood pressure is marginally elevated today but in general normal at home  REVIEW OF SYSTEMS:   Constitutional: Denies fevers, chills or abnormal weight loss Eyes: Denies blurriness of vision Ears, nose, mouth, throat, and face: Denies mucositis or sore throat Respiratory: Denies cough, dyspnea or wheezes Cardiovascular: Denies palpitation, chest discomfort or lower extremity  swelling Gastrointestinal:  Denies nausea, heartburn or change in bowel habits Skin: Denies abnormal skin rashes Lymphatics: Denies new lymphadenopathy or easy bruising Neurological:Denies numbness, tingling or new weaknesses Behavioral/Psych: Mood is stable, no new changes  All other systems were reviewed with the patient and are negative.  I have reviewed the past medical history, past surgical history, social history and family history with the patient and they are unchanged from previous note.  ALLERGIES:  is allergic to trazodone hcl.  MEDICATIONS:  Current Outpatient Medications  Medication Sig Dispense Refill   acetaminophen (TYLENOL) 500 MG tablet Take 1,000 mg by mouth every 6 (six) hours as needed.     amLODipine (NORVASC) 5 MG tablet Take 5 mg by mouth daily.     aspirin EC 81 MG tablet Take 81 mg by mouth daily.     atorvastatin (LIPITOR) 20 MG tablet Take 20 mg by mouth daily.     citalopram (CELEXA) 20 MG tablet Take 20 mg by mouth daily.     estradiol (ESTRACE) 0.1 MG/GM vaginal cream Place 1 Applicatorful vaginally 3 (three) times a week. 42.5 g 12   levothyroxine (SYNTHROID, LEVOTHROID) 100 MCG tablet Take 100 mcg by mouth daily before breakfast.      lidocaine-prilocaine (EMLA) cream Apply 1 Application topically as needed. 30 g 3   losartan-hydrochlorothiazide (HYZAAR) 100-25 MG tablet      metoprolol succinate (TOPROL-XL) 25 MG 24 hr tablet Take 25 mg by mouth daily.     Multiple Vitamin (MULTIVITAMIN WITH MINERALS) TABS Take 1 tablet by mouth daily.     olmesartan-hydrochlorothiazide (BENICAR HCT) 40-25  MG tablet Take 1 tablet by mouth daily.     pantoprazole (PROTONIX) 40 MG tablet Take 40 mg by mouth daily.     valACYclovir (VALTREX) 1000 MG tablet valacyclovir 1 gram tablet     No current facility-administered medications for this visit.   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 Her cancer was treated with surgery but no radiation or chemotherapy.   Serous endometrial cancer, MSI stable ER positive, PR neg, Her2/neu 3+      Endometrial cancer (HCC)  08/29/2016 Pathology Results   Endometrium, biopsy - HIGH GRADE ENDOMETRIAL CARCINOMA, SEE COMMENT. Microscopic Comment The sections show multiple fragments of adenocarcinoma displaying glandular and papillary patterns associated with high grade cytomorphology characterized by nuclear pleomorphism, prominent nucleoli and brisk mitosis. Immunohistochemical stains show that the tumor cells are positive for vimentin, p16, p53 with increased Ki-67 expression. Estrogen and progesterone receptor stains show patchy weak positivity. No significant positivity is seen with CEA. The findings are consistent with high grade endometrial carcinoma and the overall morphology and phenotypic features favor serous carcinoma.   08/29/2016 Initial Diagnosis   She presented with postmenopausal bleeding   10/03/2016 Imaging   CT C/A/P 09/2016 IMPRESSION: 1. Thickening of the endometrial canal up to 19 mm in fundus, presumably corresponding to the patient's reported endometrial carcinoma. 2. Multiple tiny pulmonary nodules scattered throughout the lungs bilaterally measuring 4 mm or less in size. Nodules of this size are typically considered statistically likely benign. In the setting of known primary  malignancy, metastatic disease to the lungs is not excluded, but is not strongly favored on today's examination. Attention on followup studies is recommended to ensure the stability or resolution of these nodules. 3. Subcentimeter low-attenuation lesion in the central aspect of segment 8 of the liver is too small to characterize. This is statistically likely a tiny cyst, but warrants attention on follow-up studies to exclude the possibility of a solitary hepatic metastasis. 4. 1.5 x 1.5 x 1.7 cm well-circumscribed lesion in the proximal stomach. This is of uncertain etiology and significance, and could represent a benign gastric polyp, however, further evaluation with nonemergent endoscopy is suggested in the near future for further evaluation. 5. **An incidental finding of potential clinical significance has been found. 1.1 x 1.6 cm thyroid nodule in the inferior aspect of the right lobe of the thyroid gland. Follow-up evaluation with nonemergent thyroid ultrasound is recommended in the near future to better evaluate this finding. This recommendation follows ACR consensuss guidelines: Managing Incidental Thyroid Nodules Detected on Imaging: White Paper of the ACR Incidental Thyroid Findings Committee. J Am Coll Radiol 2015;12(2):143-150.** 6. Aortic atherosclerosis, in addition to left anterior descending coronary artery disease   10/22/2016 Surgery   Robotic assisted total hysterectomy, BSO and bilateral pelvic lymphadenectomy  Final pathology revealed  a 3cm polyp containing serous carcinoma but with no myometrial invasion, no LVSI and negative nodes.  Stage IA Uterine serous cancer   10/22/2016 Pathology Results   1. Lymph nodes, regional resection, right pelvic - SIX BENIGN LYMPH NODES (0/6). 2. Lymph nodes, regional resection, left pelvic - SEVEN BENIGN LYMPH NODES (0/7). 3. Uterus +/- tubes/ovaries, neoplastic, with right ovary and fallopian tube ENDOMYOMETRIUM - SEROUS CARCINOMA ARISING  WITHIN AN ENDOMETRIAL POLYP - NO MYOMETRIAL INVASION IDENTIFIED - ADENOMYOSIS - LEIOMYOMA (1 CM) - SEE ONCOLOGY TABLE AND COMMENT CERVIX - CARCINOMA FOCALLY INVOLVES ENDOCERVICAL GLANDS - NABOTHIAN CYSTS RIGHT ADNEXA - BENIGN OVARY AND FALLOPIAN TUBE - NO CARCINOMA IDENTIFIED 4. Cul-de-sac biopsy - MESOTHELIAL HYPERPLASIA Microscopic Comment 3. ONCOLOGY TABLE-UTERUS, CARCINOMA OR CARCINOSARCOMA Specimen: Uterus, right fallopian tube and ovary Procedure: Total hysterectomy and right salpingo-oophorectomy Lymph node sampling performed: Bilateral pelvic regional resection Specimen integrity: Intact Maximum tumor size: 3 cm (polyp) Histologic type: Serous carcinoma Grade: High grade Myometrial invasion: Not identified Cervical stromal involvement: No, focal endocervical gland involvement Extent of involvement of other organs: Not identified Lymph - vascular invasion: Not identified Peritoneal washings: N/A Lymph nodes: Examined: 0 Sentinel 13 Non-sentinel 13 Total Lymph nodes with metastasis: 0 Isolated tumor cells (< 0.2 mm): 0 Micrometastasis: (> 0.2 mm and < 2.0 mm): 0 Macrometastasis: (> 2.0 mm): 0 Extracapsular extension: N/A Pelvic lymph nodes: 0 involved of 13 lymph nodes. Para-aortic lymph nodes: No para-aortic nodes submitted TNM code: pT1a, pNX FIGO Stage (based on pathologic findings, needs clinical correlation): IA Comment: Immunohistochemistry for cytokeratin AE1/AE3 is performed on all of the lymph nodes (parts 1 & 2) and no metastatic carcinoma is identified.   07/04/2017 Genetic Testing   The patient had genetic testing due to a personal history of breast and uterine cancer, and a family history of stomach cancer.  The Multi-Cancer Panel was ordered. The Multi-Cancer Panel offered by [facility] includes sequencing and/or deletion duplication testing of the following 83 genes: ALK, APC, ATM, AXIN2,BAP1,  BARD1, BLM, BMPR1A, BRCA1, BRCA2, BRIP1, CASR, CDC73, CDH1,  CDK4, CDKN1B, CDKN1C, CDKN2A (p14ARF), CDKN2A (p16INK4a), CEBPA, CHEK2, CTNNA1, DICER1, DIS3L2, EGFR (c.2369C>T, p.Thr790Met variant only), EPCAM (Deletion/duplication testing only), FH, FLCN, GATA2, GPC3, GREM1 (Promoter region deletion/duplication testing only), HOXB13 (c.251G>A, p.Gly84Glu), HRAS, KIT, MAX, MEN1, MET, MITF (c.952G>A, p.Glu318Lys variant only), MLH1, MSH2, MSH3, MSH6, MUTYH, NBN, NF1, NF2, NTHL1, PALB2, PDGFRA, PHOX2B, PMS2, POLD1, POLE, POT1, PRKAR1A, PTCH1, PTEN, RAD50, RAD51C, RAD51D, RB1, RECQL4, RET, RUNX1, SDHAF2, SDHA (sequence changes only), SDHB, SDHC, SDHD, SMAD4, SMARCA4, SMARCB1, SMARCE1, STK11, SUFU, TERC, TERT, TMEM127, TP53, TSC1, TSC2, VHL, WRN and WT1.   Results: No pathogenic variants were identified.  A variant of uncertain significance in the gene APC was identified.  c.791A>G (p.Gln264Arg).  The date of this test report is 07/04/2017.     Genetic Testing   Patient has genetic testing done for MSI. 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    12/03/2017 Imaging   CT Chest/Abd/Pelvis to follow pulmonary nodule and gastric mass IMPRESSION: 1. Stable CT of the chest. Small pulmonary nodules are unchanged when compared with previous exam. 2. No new findings identified. 3. Subcentimeter low-attenuation lesions within the liver are remain too small to characterize but are stable from prior exam. 4. Persistent indeterminate low-attenuation structure within the proximal stomach is unchanged measuring 1.4 cm. Correlation with direct visualization is advised   06/30/2018 Imaging   CT CHEST Lungs/Pleura: Stable scattered sub-cm pulmonary nodules are again seen bilaterally  and are stable compared to previous studies. No new or enlarging pulmonary nodules or masses identified. No evidence of pulmonary infiltrate or pleural effusion.   08/19/2018 Echocardiogram   [place] is done in [place]: EF 60-65%. Mild impaired relaxation. (report scanned)    09/17/2018 Relapse/Recurrence   Presented with c/o vagina discharge.  Lesion noted at the left vaginal apex 6mm lesion removed Path c/w high grade serous cancer   09/17/2018 Pathology Results   Vagina, biopsy, left apex - HIGH GRADE SEROUS CARCINOMA.   09/28/2018 PET scan   1. Two hypermetabolic axial lymph nodes. Unusual site for metastatic endometrial carcinoma however the activity is more intense than typically seen in reactive adenopathy. Suggest ultrasound-guided percutaneous biopsy of the larger RIGHT axial lymph node 2. No evidence of local recurrence at the vaginal cuff.  3. No metastatic adenopathy in the abdomen or pelvis. 4. Stable small pulmonary nodules   10/09/2018 Pathology Results   Lymph node, needle/core biopsy, right axilla - METASTATIC CARCINOMA, SEE COMMENT. Microscopic Comment The carcinoma appears high grade. Immunohistochemistry is positive for cytokeratin 7, PAX-8, and ER. Cytokeratin 20, CDX-2, PR, and GATA-3 are negative. The findings along with the history are consistent with a gynecologic primary.    10/09/2018 Procedure   Ultrasound-guided core biopsies of a right axillary lymph node.   10/14/2018 Cancer Staging   Staging form: Corpus Uteri - Carcinoma and Carcinosarcoma, AJCC 8th Edition - Clinical: Stage IVB (cT1, cN0, pM1) - Signed by Artis Delay, MD on 10/14/2018   10/19/2018 Imaging   Placement of single lumen port a cath via right internal jugular vein. The catheter tip lies at the cavo-atrial junction. A power injectable port a cath was placed and is ready for immediate use.    10/21/2018 -  Chemotherapy   The patient had carboplatin and taxol for treatment   11/11/2018 - 03/12/2022 Chemotherapy   Patient is on Treatment Plan : BREAST Trastuzumab q21d     12/21/2018 PET scan   1. Interval resolution of hypermetabolic right axillary lymph nodes. No metabolic findings highly suspicious for recurrent metastatic disease. 2. New mild hypermetabolism within a  borderline prominent portacaval lymph node, nonspecific. While a reactive node is favored, a lymph node metastasis cannot be entirely excluded. Suggest attention to this lymph node on follow-up PET-CT in 3-6 months. 3. Nonspecific new hypermetabolism at the ileocecal valve, more likely physiologic given absence of CT correlate. 4. Scattered subcentimeter pulmonary nodules are all stable and below PET resolution, more likely benign, continued CT surveillance advised. 5. Chronic findings include: Aortic Atherosclerosis (ICD10-I70.0). Marked diffuse colonic diverticulosis. Coronary atherosclerosis.     12/28/2018 Echocardiogram   1. The left ventricle has normal systolic function with an ejection fraction of 60-65%. The cavity size was normal. Left ventricular diastolic Doppler parameters are consistent with impaired relaxation.  2. GLS recorded as -10.7 but LV appears hyperdynamic and tracking of endocardium appears poor.  3. The right ventricle has normal systolic function. The cavity was normal. There is no increase in right ventricular wall thickness.  4. Mild thickening of the mitral valve leaflet.  5. The aortic valve was not well visualized. Mild thickening of the aortic valve. Aortic valve regurgitation is trivial by color flow Doppler.   03/23/2019 Imaging   PET 1. No findings of hypermetabolic residual/recurrent or metastatic disease. 2. Similar low-level hypermetabolism within normal sized portocaval and right inguinal nodes, favored to be reactive. 3. Ongoing stability of small bilateral pulmonary nodules, favored to be benign. Below PET resolution.  4. Coronary artery atherosclerosis. Aortic Atherosclerosis   03/26/2019 Echocardiogram   1. The left ventricle has normal systolic function, with an ejection fraction of 55-60%. The cavity size was normal. Left ventricular diastolic Doppler parameters are consistent with impaired relaxation.  2. The right ventricle has normal systolic  function. The cavity was normal.  3. The mitral valve is abnormal. Mild thickening of the mitral valve leaflet. There is mild mitral annular calcification present.  4. The tricuspid valve is grossly normal.  5. The aortic valve is tricuspid. Mild calcification of the aortic valve. No stenosis of the aortic valve.  6. The aorta is normal unless otherwise noted.  7. Normal LV systolic function; grade 1 diastolic dysfunction; GLS-15.2%.   06/30/2019 Imaging   Chest Impression:   1. No evidence thoracic metastasis. 2. Stable small benign-appearing pulmonary nodules   Abdomen / Pelvis Impression:   1. No evidence of metastatic disease in the abdomen pelvis. 2.  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 3.  Aortic Atherosclerosis (ICD10-I70.0).   06/30/2019 Echocardiogram    1. Left ventricular ejection fraction, by visual estimation, is 60 to 65%. The left ventricle has normal function. There is no left ventricular hypertrophy.  2. Left ventricular diastolic parameters are consistent with Grade I diastolic dysfunction (impaired relaxation).  3. The left ventricle has no regional wall motion abnormalities.  4. Global right ventricle has normal systolic function.The right ventricular size is normal. No increase in right ventricular wall thickness.  5. Left atrial size was mild-moderately dilated.  6. Right atrial size was normal.  7. The mitral valve is normal in structure. Trivial mitral valve regurgitation.  8. The tricuspid valve is normal in structure. Tricuspid valve regurgitation is trivial.  9. The aortic valve is normal in structure. Aortic valve regurgitation is trivial. No evidence of aortic valve sclerosis or stenosis. 10. The pulmonic valve was grossly normal. Pulmonic valve regurgitation is not visualized. 11. Mildly elevated pulmonary artery systolic pressure. 12. The inferior vena cava is normal in size with greater than 50% respiratory variability, suggesting  right atrial pressure of 3 mmHg. 13. The average left ventricular global longitudinal strain is -12.5 %. GLS underestimated due to poor endocardial tracking.     09/27/2019 Imaging   1. Multiple small, nonspecific pulmonary nodules are again noted. The previously noted lung nodules are unchanged in size from previous exam. There is a new lung nodule within the medial right lower lobe which is nonspecific measure 4 mm. Attention at follow-up imaging is recommended. 2. No evidence of metastatic disease within the abdomen or pelvis. 3. Coronary artery calcifications.  The 4.  Aortic Atherosclerosis (ICD10-I70.0).   09/30/2019 Echocardiogram    1. Left ventricular ejection fraction, by estimation, is 60 to 65%. The left ventricle has normal function. The left ventricle has no regional wall motion abnormalities. Left ventricular diastolic parameters are consistent with Grade I diastolic dysfunction (impaired relaxation).  2. Right ventricular systolic function is normal. The right ventricular size is normal. There is normal pulmonary artery systolic pressure. The estimated right ventricular systolic pressure is 24.2 mmHg.  3. The mitral valve is normal in structure. No evidence of mitral valve regurgitation. No evidence of mitral stenosis.  4. The aortic valve is normal in structure. Aortic valve regurgitation is trivial. No aortic stenosis is present.  5. The inferior vena cava is normal in size with greater than 50% respiratory variability, suggesting right atrial pressure of 3 mmHg.     01/07/2020 Echocardiogram  1. Normal LV function; grade 1 diastolic dysfunction; mild AI; GLS -21%.  2. Left ventricular ejection fraction, by estimation, is 55 to 60%. The left ventricle has normal function. The left ventricle has no regional wall motion abnormalities. Left ventricular diastolic parameters are consistent with Grade I diastolic dysfunction (impaired relaxation).  3. Right ventricular systolic  function is normal. The right ventricular size is normal.  4. The mitral valve is normal in structure. Trivial mitral valve regurgitation. No evidence of mitral stenosis.  5. The aortic valve is tricuspid. Aortic valve regurgitation is mild. Mild aortic valve sclerosis is present, with no evidence of aortic valve stenosis.  6. The inferior vena cava is normal in size with greater than 50% respiratory variability, suggesting right atrial pressure of 3 mmHg.     03/27/2020 Imaging   1. Status post hysterectomy. No evidence of recurrent or metastatic disease in the abdomen or pelvis. 2. Multiple small pulmonary nodules in the lung bases are unchanged to the extent that they are included on current examination and remain most likely sequelae of prior infection or inflammation. Attention on follow-up. 3. Aortic Atherosclerosis (ICD10-I70.0).   04/17/2020 Echocardiogram   1. Normal LVEF, normal and unchanged GLS: -20.2%.  2. Left ventricular ejection fraction, by estimation, is 60 to 65%. The left ventricle has normal function. The left ventricle has no regional wall motion abnormalities. There is mild concentric left ventricular hypertrophy. Left ventricular diastolic parameters are consistent with Grade I diastolic dysfunction (impaired relaxation). The average left ventricular global longitudinal strain is -20.2 %. The global longitudinal strain is normal.  3. Right ventricular systolic function is normal. The right ventricular size is normal.  4. The mitral valve is normal in structure. Mild mitral valve regurgitation. No evidence of mitral stenosis.  5. The aortic valve is normal in structure. There is mild calcification of the aortic valve. There is mild thickening of the aortic valve. Aortic valve regurgitation is mild. No aortic stenosis is present.  6. The inferior vena cava is normal in size with greater than 50% respiratory variability, suggesting right atrial pressure of 3 mmHg.   08/02/2020  Echocardiogram   1. Left ventricular ejection fraction, by estimation, is 55 to 60%. The left ventricle has normal function. The left ventricle has no regional wall motion abnormalities. Left ventricular diastolic parameters are consistent with Grade I diastolic dysfunction (impaired relaxation). The average left ventricular global longitudinal strain is -27.0 %. The global longitudinal strain is normal.  2. Right ventricular systolic function is normal. The right ventricular size is normal. Tricuspid regurgitation signal is inadequate for assessing PA pressure.  3. The mitral valve is grossly normal. Trivial mitral valve regurgitation. No evidence of mitral stenosis.  4. The aortic valve is tricuspid. Aortic valve regurgitation is mild. No aortic stenosis is present.  5. The inferior vena cava is normal in size with greater than 50% respiratory variability, suggesting right atrial pressure of 3 mmHg.   09/19/2020 Imaging   1. Status post hysterectomy. No evidence of recurrent or new metastatic disease within the chest, abdomen, or pelvis. 2. Scattered bilateral tiny pulmonary nodules, unchanged from prior studies and most likely sequela of prior infection or inflammation, recommend attention on follow-up imaging. No new suspicious pulmonary nodules or masses. 3. Subcutaneous edema and small hematoma overlying the posterior gluteal musculature, recommend correlation with recent history of trauma. 4. Mild low-density wall thickening of the gastric antrum, which may represent gastritis. 5. Hepatic steatosis. 6. Extensive sigmoid colonic diverticulosis without findings  of acute diverticulitis. 7. Aortic atherosclerosis.  Aortic Atherosclerosis (ICD10-I70.0).   11/20/2020 Echocardiogram    1. Compared to echo from Jan 2022, Global longitudinal strain is less negative. (Previously -27%). Left ventricular ejection fraction, by estimation, is 60 to 65%. The left ventricle has normal function. The left  ventricle has no regional wall motion abnormalities. There is mild left ventricular hypertrophy. Left ventricular diastolic parameters are indeterminate.  2. Right ventricular systolic function is normal. The right ventricular size is normal.  3. The mitral valve is normal in structure. Trivial mitral valve regurgitation.  4. Aortic valve regurgitation is mild. Mild aortic valve sclerosis is present, with no evidence of aortic valve stenosis.     03/12/2021 Echocardiogram   1. Left ventricular ejection fraction, by estimation, is 60 to 65%. Left ventricular ejection fraction by 2D MOD biplane is 63.6 %. The left ventricle has normal function. The left ventricle has no regional wall motion abnormalities. There is mild left ventricular hypertrophy. Left ventricular diastolic parameters are consistent with Grade I diastolic dysfunction (impaired relaxation). The average left ventricular global longitudinal strain is -20.0 %. The global longitudinal strain is normal.  2. Right ventricular systolic function is normal. The right ventricular size is normal. There is normal pulmonary artery systolic pressure. The estimated right ventricular systolic pressure is 20.0 mmHg.  3. The mitral valve is normal in structure. No evidence of mitral valve regurgitation. No evidence of mitral stenosis.  4. The aortic valve is tricuspid. Aortic valve regurgitation is trivial. No aortic stenosis is present. Aortic regurgitation PHT measures 403 msec.  5. The inferior vena cava is normal in size with greater than 50% respiratory variability, suggesting right atrial pressure of 3 mmHg.   04/02/2021 Imaging   Stable small pulmonary nodules scattered throughout the chest, unchanged, without new or suspicious pulmonary nodule.   Three-vessel coronary artery disease.   Aortic Atherosclerosis (ICD10-I70.0).   06/06/2021 Imaging    1. Left ventricular ejection fraction, by estimation, is 60 to 65%. The left ventricle has normal  function. The left ventricle has no regional wall motion abnormalities. Left ventricular diastolic parameters are consistent with Grade I diastolic dysfunction (impaired relaxation).  2. Right ventricular systolic function is normal. The right ventricular size is normal. There is normal pulmonary artery systolic pressure. The estimated right ventricular systolic pressure is 28.6 mmHg.  3. Left atrial size was mildly dilated.  4. The mitral valve is normal in structure. Trivial mitral valve regurgitation.  5. The aortic valve is tricuspid. Aortic valve regurgitation is mild. No aortic stenosis is present.  6. The inferior vena cava is normal in size with greater than 50% respiratory variability, suggesting right atrial pressure of 3 mmHg.  7. GLS attempted but not reported due to suboptimal tracking.     09/25/2021 Echocardiogram    1. Left ventricular ejection fraction, by estimation, is 60 to 65%. Left ventricular ejection fraction by 3D volume is 56 %. The left ventricle has normal function. The left ventricle has no regional wall motion abnormalities. Left ventricular diastolic parameters were normal.  2. Right ventricular systolic function is normal. The right ventricular size is normal. There is normal pulmonary artery systolic pressure.  3. The mitral valve is normal in structure. Trivial mitral valve regurgitation. No evidence of mitral stenosis.  4. The aortic valve is tricuspid. Aortic valve regurgitation is mild. No aortic stenosis is present.  5. The inferior vena cava is normal in size with greater than 50% respiratory variability, suggesting right atrial  pressure of 3 mmHg.   10/15/2021 Imaging   1. Stable examination post prior hysterectomy without evidence of local recurrence or metastatic disease within the chest, abdomen, or pelvis. 2. Unchanged small bilateral pulmonary nodules, likely benign. 3. Left-sided colonic diverticulosis without findings of acute diverticulitis. 4. Aortic  Atherosclerosis (ICD10-I70.0).       12/31/2021 Echocardiogram    1. Left ventricular ejection fraction, by estimation, is 65 to 70%. The left ventricle has normal function. The left ventricle has no regional wall motion abnormalities. Left ventricular diastolic parameters are consistent with Grade I diastolic dysfunction (impaired relaxation).  2. Right ventricular systolic function is normal. The right ventricular size is normal. There is normal pulmonary artery systolic pressure. The estimated right ventricular systolic pressure is 27.4 mmHg.  3. The mitral valve is grossly normal. Trivial mitral valve regurgitation. No evidence of mitral stenosis.  4. The aortic valve is tricuspid. Aortic valve regurgitation is mild. No aortic stenosis is present.  5. The inferior vena cava is normal in size with greater than 50% respiratory variability, suggesting right atrial pressure of 3 mmHg.     04/02/2022 -  Chemotherapy   Patient is on Treatment Plan : BREAST MAINTENANCE Trastuzumab IV (6) or SQ (600) D1 q21d X 11 Cycles     04/04/2022 Echocardiogram    1. Left ventricular ejection fraction, by estimation, is 65 to 70%. The left ventricle has normal function. The left ventricle has no regional wall motion abnormalities. Left ventricular diastolic parameters are consistent with Grade I diastolic dysfunction (impaired relaxation).  2. Right ventricular systolic function is normal. The right ventricular size is normal.  3. Left atrial size was mildly dilated.  4. The mitral valve is normal in structure. Trivial mitral valve regurgitation. No evidence of mitral stenosis.  5. The aortic valve is tricuspid. There is mild calcification of the aortic valve. Aortic valve regurgitation is mild. No aortic stenosis is present. Aortic regurgitation PHT measures 613 msec. Aortic valve area, by VTI measures 1.70 cm. Aortic valve mean gradient measures 3.0 mmHg. Aortic valve Vmax measures 1.15 m/s.  6. The inferior  vena cava is normal in size with greater than 50% respiratory variability, suggesting right atrial pressure of 3 mmHg.   07/02/2022 Echocardiogram   1. Left ventricular ejection fraction, by estimation, is 60 to 65%. Left ventricular ejection fraction by 3D volume is 58 %. The left ventricle has normal function. The left ventricle has no regional wall motion abnormalities. Left ventricular diastolic parameters are consistent with Grade I diastolic dysfunction (impaired relaxation). The average left ventricular global longitudinal strain is -20.7 %. The global longitudinal strain is normal.  2. Right ventricular systolic function is normal. The right ventricular size is normal. Tricuspid regurgitation signal is inadequate for assessing PA pressure.  3. Left atrial size was mildly dilated.  4. The mitral valve is normal in structure. Trivial mitral valve regurgitation. No evidence of mitral stenosis.  5. The aortic valve is grossly normal. Aortic valve regurgitation is mild. No aortic stenosis is present.  6. The inferior vena cava is normal in size with greater than 50% respiratory variability, suggesting right atrial pressure of 3 mmHg.   11/05/2022 Echocardiogram       1. Left ventricular ejection fraction, by estimation, is 55 to 60%. Left ventricular ejection fraction by 3D volume is 59 %. The left ventricle has normal function. The left ventricle has no regional wall motion abnormalities. Left ventricular diastolic  parameters are consistent with Grade I diastolic  dysfunction (impaired relaxation). The average left ventricular global longitudinal strain is -16.8 %. The global longitudinal strain is normal.  2. Right ventricular systolic function is normal. The right ventricular size is normal. There is normal pulmonary artery systolic pressure.  3. The mitral valve is normal in structure. No evidence of mitral valve regurgitation. No evidence of mitral stenosis.  4. The aortic valve is normal in  structure. Aortic valve regurgitation is mild. No aortic stenosis is present.  5. The inferior vena cava is normal in size with greater than 50% respiratory variability, suggesting right atrial pressure of 3 mmHg.     11/07/2022 Imaging   CT CHEST ABDOMEN PELVIS W CONTRAST  Result Date: 11/07/2022 CLINICAL DATA:  Cervical cancer. Assess treatment response. On chemotherapy * Tracking Code: BO * EXAM: CT CHEST, ABDOMEN, AND PELVIS WITH CONTRAST TECHNIQUE: Multidetector CT imaging of the chest, abdomen and pelvis was performed following the standard protocol during bolus administration of intravenous contrast. RADIATION DOSE REDUCTION: This exam was performed according to the departmental dose-optimization program which includes automated exposure control, adjustment of the mA and/or kV according to patient size and/or use of iterative reconstruction technique. CONTRAST:  OMNIPAQUE IOHEXOL 300 MG/ML  SOLN COMPARISON:  CT 10/15/2021 and older FINDINGS: CT CHEST FINDINGS Cardiovascular: Right upper chest port. Heart is nonenlarged. No pericardial effusion. The thoracic aorta has a normal course and caliber with scattered vascular calcifications. There is a bovine type aortic arch, a normal variant. Mediastinum/Nodes: Heterogeneous small thyroid gland with a nodular area in the right is unchanged from previous. As stated previously, no specific imaging follow-up. No specific abnormal lymph node enlargement seen in the axillary regions, hilum or mediastinum. Surgical clips in the left axillary region. Breast implants are noted. Normal caliber thoracic esophagus. Lungs/Pleura: There is some linear opacity lung bases likely scar or atelectasis. No pleural effusion. No consolidation or pneumothorax. There are a few scattered tiny lung nodules identified. Example 3 mm superior segment left lower lobe on series 6, image 49 which is stable. Lingular nodule measuring 4 mm on the prior, today is stable on series 6,  image 73. 3 mm nodule along the margin of the minor fissure on the right is also stable. There is a larger ill-defined areas seen right lung on the prior examination series 4, image 63 which has resolved. This may have been infectious or inflammatory. There is a small area of bronchiectasis in this location today on series 6, image 58. No new dominant lung nodule. Musculoskeletal: Diffuse moderate degenerative changes of the spine. CT ABDOMEN PELVIS FINDINGS Hepatobiliary: Fatty liver infiltration. There are some tiny low-attenuation lesion seen in the liver which are too small to completely characterize but unchanged from previous. Simple attention on follow-up for the patient's neoplasm. Example series 2, image 44 anterior to branches of the right hepatic vein. Gallbladder is nondilated. Patent portal vein. Pancreas: Unremarkable. No pancreatic ductal dilatation or surrounding inflammatory changes. Spleen: Normal in size without focal abnormality. Adrenals/Urinary Tract: The adrenal glands are preserved. No enhancing renal mass. There are some tiny low-attenuation lesion seen along each kidney which are too small to completely characterize likely small cysts. No specific imaging follow-up. Bosniak 2 lesions. Contracted urinary bladder. Stomach/Bowel: Large bowel has extensive left-sided colonic stool. Extensive colonic diverticulosis as well. The large bowel is nondilated. Small bowel is nondilated. There is a nodular area along the upper aspect of the stomach fold thickening. On series 2, image 45 this measures 18 by 14 mm. This  is not well seen previously but on the prior study the stomach is more collapsed. There may have been something in retrospect in this location. There also is some nodular areas along the antrum. Recommend further evaluation to exclude underlying lesion. Vascular/Lymphatic: Normal caliber aorta and IVC with scattered vascular calcifications. No specific abnormal lymph node enlargement  identified in the abdomen and pelvis. Reproductive: Status post hysterectomy. No adnexal masses. Other: No free air or free fluid. Musculoskeletal: Moderate degenerative changes of the spine and pelvis. Trace anterolisthesis at L4-5 and L5-S1. 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 IMPRESSION: Stable tiny multiple pulmonary nodules. 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 No developing lymph node enlargement or fluid collection. Colonic diverticulosis of the sigmoid colon with moderate stool. There are nodular masslike areas seen in the stomach including towards the fundus and small area towards the antrum with fold thickening. Hyperplastic polyps are possible. Recommend further workup of the stomach when appropriate to exclude an aggressive process. Electronically Signed   By: Karen Kays M.D.   On: 11/07/2022 13:39   ECHOCARDIOGRAM COMPLETE  Result Date: 11/05/2022    ECHOCARDIOGRAM REPORT   Patient Name:   Vicki Cervantes Date of Exam: 11/05/2022 Medical Rec #:  [id number]      Height:       62.0 in Accession #:    [id number]     Weight:       134.8 lb Date of Birth:  30-Oct-1940      BSA:          1.617 m Patient Age:    81 years       BP:           144/72 mmHg Patient Gender: F              HR:           69 bpm. Exam Location:  Outpatient Procedure: 2D Echo, 3D Echo, Cardiac Doppler, Color Doppler and Strain Analysis Indications:    chemo  History:        Patient has prior history of Echocardiogram examinations. Risk                 Factors:Hypertension.  Sonographer:    Mike Gip Referring Phys: [id number] Mylen Mangan IMPRESSIONS  1. Left ventricular ejection fraction, by estimation, is 55 to 60%. Left ventricular ejection fraction by 3D volume is 59 %. The left ventricle has normal function. The left ventricle has no regional wall motion abnormalities. Left ventricular diastolic  parameters are consistent with Grade I diastolic dysfunction (impaired relaxation). The average  left ventricular global longitudinal strain is -16.8 %. The global longitudinal strain is normal.  2. Right ventricular systolic function is normal. The right ventricular size is normal. There is normal pulmonary artery systolic pressure.  3. The mitral valve is normal in structure. No evidence of mitral valve regurgitation. No evidence of mitral stenosis.  4. The aortic valve is normal in structure. Aortic valve regurgitation is mild. No aortic stenosis is present.  5. The inferior vena cava is normal in size with greater than 50% respiratory variability, suggesting right atrial pressure of 3 mmHg. Comparison(s): Prior 07/01/2022: EF 60%, GLS -20.7% Prior 03/2022: GLS-15.3. FINDINGS  Left Ventricle: Left ventricular ejection fraction, by estimation, is 55 to 60%. Left ventricular ejection fraction by 3D volume is 59 %. The left ventricle has normal function. The left ventricle has no regional wall motion abnormalities. The average left ventricular global longitudinal strain  is -16.8 %. The global longitudinal strain is normal. The left ventricular internal cavity size was normal in size. There is no left ventricular hypertrophy. Left ventricular diastolic parameters are consistent  with Grade I diastolic dysfunction (impaired relaxation). Right Ventricle: The right ventricular size is normal. No increase in right ventricular wall thickness. Right ventricular systolic function is normal. There is normal pulmonary artery systolic pressure. The tricuspid regurgitant velocity is 1.79 m/s, and  with an assumed right atrial pressure of 3 mmHg, the estimated right ventricular systolic pressure is 15.8 mmHg. Left Atrium: Left atrial size was normal in size. Right Atrium: Right atrial size was normal in size. Pericardium: There is no evidence of pericardial effusion. Mitral Valve: The mitral valve is normal in structure. No evidence of mitral valve regurgitation. No evidence of mitral valve stenosis. Tricuspid Valve: The  tricuspid valve is normal in structure. Tricuspid valve regurgitation is not demonstrated. No evidence of tricuspid stenosis. Aortic Valve: The aortic valve is normal in structure. Aortic valve regurgitation is mild. Aortic regurgitation PHT measures 497 msec. No aortic stenosis is present. Pulmonic Valve: The pulmonic valve was normal in structure. Pulmonic valve regurgitation is mild. No evidence of pulmonic stenosis. Aorta: The aortic root is normal in size and structure. Venous: The inferior vena cava is normal in size with greater than 50% respiratory variability, suggesting right atrial pressure of 3 mmHg. IAS/Shunts: No atrial level shunt detected by color flow Doppler.  LEFT VENTRICLE PLAX 2D LVIDd:         3.80 cm         Diastology LVIDs:         2.50 cm         LV e' medial:    7.51 cm/s LV PW:         1.00 cm         LV E/e' medial:  7.0 LV IVS:        1.00 cm         LV e' lateral:   8.16 cm/s LVOT diam:     1.90 cm         LV E/e' lateral: 6.4 LV SV:         49 LV SV Index:   30              2D LVOT Area:     2.84 cm        Longitudinal                                Strain                                2D Strain GLS  -16.8 % LV Volumes (MOD)               Avg: LV vol d, MOD    62.3 ml A2C:                           3D Volume EF LV vol d, MOD    68.5 ml       LV 3D EF:    Left A4C:  ventricul LV vol s, MOD    25.6 ml                    ar A2C:                                        ejection LV vol s, MOD    24.1 ml                    fraction A4C:                                        by 3D LV SV MOD A2C:   36.7 ml                    volume is LV SV MOD A4C:   68.5 ml                    59 %. LV SV MOD BP:    39.9 ml                                 3D Volume EF:                                3D EF:        59 %                                LV EDV:       116 ml                                LV ESV:       48 ml                                LV SV:        68 ml  RIGHT VENTRICLE             IVC RV Basal diam:  3.00 cm     IVC diam: 1.40 cm RV S prime:     10.20 cm/s TAPSE (M-mode): 1.9 cm LEFT ATRIUM             Index        RIGHT ATRIUM           Index LA diam:        3.40 cm 2.10 cm/m   RA Area:     11.20 cm LA Vol (A2C):   49.4 ml 30.56 ml/m  RA Volume:   22.50 ml  13.92 ml/m LA Vol (A4C):   28.3 ml 17.51 ml/m LA Biplane Vol: 38.6 ml 23.88 ml/m  AORTIC VALVE LVOT Vmax:   79.80 cm/s LVOT Vmean:  49.400 cm/s LVOT VTI:    0.173 m AI PHT:      497 msec  AORTA Ao Root diam: 3.20 cm MITRAL VALVE               TRICUSPID  VALVE MV Area (PHT): 2.84 cm    TR Peak grad:   12.8 mmHg MV Decel Time: 267 msec    TR Vmax:        179.00 cm/s MV E velocity: 52.40 cm/s MV A velocity: 69.80 cm/s  SHUNTS MV E/A ratio:  0.75        Systemic VTI:  0.17 m                            Systemic Diam: 1.90 cm Donato Schultz MD Electronically signed by Donato Schultz MD Signature Date/Time: 11/05/2022/2:11:35 PM    Final       02/17/2023 Echocardiogram   ECHOCARDIOGRAM COMPLETE  Result Date: 02/17/2023    ECHOCARDIOGRAM REPORT   Patient Name:   Vicki Cervantes Date of Exam: 02/17/2023 Medical Rec #:  [id number]      Height:       62.0 in Accession #:    [id number]     Weight:       137.4 lb Date of Birth:  21-May-1941      BSA:          1.630 m Patient Age:    82 years       BP:           143/77 mmHg Patient Gender: F              HR:           71 bpm. Exam Location:  Outpatient Procedure: 2D Echo, Strain Analysis, Color Doppler and Cardiac Doppler Indications:    Chemo  History:        Patient has prior history of Echocardiogram examinations, most                 recent 11/05/2022. Risk Factors:Hypertension.  Referring Phys: [id number] Alyda Megna IMPRESSIONS  1. Left ventricular ejection fraction, by estimation, is 60 to 65%. The left ventricle has normal function. The left ventricle has no regional wall motion abnormalities. Left ventricular diastolic parameters are consistent with Grade I diastolic  dysfunction (impaired relaxation).  2. Right ventricular systolic function is normal. The right ventricular size is normal.  3. The mitral valve is normal in structure. Trivial mitral valve regurgitation.  4. The aortic valve was not well visualized. Aortic valve regurgitation is not visualized.  5. The inferior vena cava is normal in size with greater than 50% respiratory variability, suggesting right atrial pressure of 3 mmHg. FINDINGS  Left Ventricle: Left ventricular ejection fraction, by estimation, is 60 to 65%. The left ventricle has normal function. The left ventricle has no regional wall motion abnormalities. The left ventricular internal cavity size was normal in size. There is  no left ventricular hypertrophy. Left ventricular diastolic parameters are consistent with Grade I diastolic dysfunction (impaired relaxation). Right Ventricle: The right ventricular size is normal. Right ventricular systolic function is normal. Left Atrium: Left atrial size was normal in size. Right Atrium: Right atrial size was normal in size. Pericardium: There is no evidence of pericardial effusion. Mitral Valve: The mitral valve is normal in structure. Trivial mitral valve regurgitation. Tricuspid Valve: The tricuspid valve is normal in structure. Tricuspid valve regurgitation is not demonstrated. Aortic Valve: The aortic valve was not well visualized. Aortic valve regurgitation is not visualized. Pulmonic Valve: Pulmonic valve regurgitation is not visualized. Aorta: The aortic root and ascending aorta are structurally normal, with no evidence of dilitation. Venous: The inferior vena cava  is normal in size with greater than 50% respiratory variability, suggesting right atrial pressure of 3 mmHg. IAS/Shunts: No atrial level shunt detected by color flow Doppler.  LEFT VENTRICLE PLAX 2D LVIDd:         4.50 cm   Diastology LVIDs:         3.10 cm   LV e' medial:    5.33 cm/s LV PW:         0.90 cm   LV E/e' medial:  13.3 LV IVS:         0.90 cm   LV e' lateral:   7.07 cm/s LVOT diam:     2.00 cm   LV E/e' lateral: 10.1 LV SV:         44 LV SV Index:   27 LVOT Area:     3.14 cm  RIGHT VENTRICLE            IVC RV S prime:     8.59 cm/s  IVC diam: 0.70 cm TAPSE (M-mode): 1.7 cm LEFT ATRIUM             Index        RIGHT ATRIUM           Index LA Vol (A2C):   43.9 ml 26.94 ml/m  RA Area:     11.00 cm LA Vol (A4C):   35.5 ml 21.78 ml/m  RA Volume:   21.50 ml  13.19 ml/m LA Biplane Vol: 41.6 ml 25.53 ml/m  AORTIC VALVE LVOT Vmax:   59.60 cm/s LVOT Vmean:  41.100 cm/s LVOT VTI:    0.139 m MITRAL VALVE MV Area (PHT): 4.57 cm    SHUNTS MV Decel Time: 166 msec    Systemic VTI:  0.14 m MV E velocity: 71.10 cm/s  Systemic Diam: 2.00 cm MV A velocity: 78.40 cm/s MV E/A ratio:  0.91 Photographer signed by Carolan Clines Signature Date/Time: 02/17/2023/12:11:53 PM    Final       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/14/2018 Initial Diagnosis   Metastasis to lymph nodes (HCC)   10/21/2018 - 02/24/2019 Chemotherapy   The patient had palonosetron (ALOXI) injection 0.25 mg, 0.25 mg, Intravenous,  Once, 7 of 7 cycles Administration: 0.25 mg (10/21/2018), 0.25 mg (11/11/2018), 0.25 mg (12/02/2018), 0.25 mg (12/23/2018), 0.25 mg (01/13/2019), 0.25 mg (02/03/2019), 0.25 mg (02/24/2019) CARBOplatin (PARAPLATIN) 310 mg in sodium chloride 0.9 % 250 mL chemo infusion, 310 mg, Intravenous,  Once, 7 of 7 cycles Dose modification: 300 mg (original dose 311.5 mg, Cycle 4, Reason: Dose not tolerated) Administration: 310 mg (10/21/2018), 300 mg (11/11/2018), 300 mg (12/02/2018), 300 mg (12/23/2018), 300 mg (01/13/2019), 300 mg (02/03/2019), 300 mg (02/24/2019) PACLitaxel (TAXOL) 222 mg in sodium chloride 0.9 % 250 mL chemo infusion (> 80mg /m2), 135 mg/m2 = 222 mg, Intravenous,  Once, 7 of 7 cycles Administration: 222 mg (10/21/2018), 222 mg (11/11/2018), 222 mg (12/02/2018), 222 mg (12/23/2018), 222 mg (01/13/2019), 222 mg (02/03/2019), 222 mg (02/24/2019) fosaprepitant (EMEND)  150 mg, dexamethasone (DECADRON) 12 mg in sodium chloride 0.9 % 145 mL IVPB, , Intravenous,  Once, 7 of 7 cycles Administration:  (10/21/2018),  (11/11/2018),  (12/02/2018),  (12/23/2018),  (01/13/2019),  (02/03/2019),  (02/24/2019)  for chemotherapy treatment.    04/02/2022 -  Chemotherapy   Patient is on Treatment Plan : BREAST MAINTENANCE Trastuzumab IV (6) or SQ (600) D1 q21d X 11 Cycles       PHYSICAL EXAMINATION: ECOG PERFORMANCE STATUS: 0 - Asymptomatic  Vitals:   06/10/23 0924  BP: (!) 178/63  Pulse: 66  Resp: 18  Temp: 98.5 F (36.9 C)  SpO2: 95%   Filed Weights   06/10/23 0924  Weight: 135 lb 3.2 oz (61.3 kg)    GENERAL:alert, no distress and comfortable  LABORATORY DATA:  I have reviewed the data as listed    Component Value Date/Time   NA 136 06/10/2023 0846   NA 139 02/13/2017 1512   K 3.7 06/10/2023 0846   K 4.2 02/13/2017 1512   CL 100 06/10/2023 0846   CO2 28 06/10/2023 0846   CO2 28 02/13/2017 1512   GLUCOSE 99 06/10/2023 0846   GLUCOSE 118 02/13/2017 1512   BUN 20 06/10/2023 0846   BUN 25.2 02/13/2017 1512   CREATININE 1.25 (H) 06/10/2023 0846   CREATININE 1.20 (H) 11/05/2022 1215   CREATININE 1.2 (H) 02/13/2017 1512   CALCIUM 8.8 (L) 06/10/2023 0846   CALCIUM 10.0 02/13/2017 1512   PROT 6.8 06/10/2023 0846   ALBUMIN 4.0 06/10/2023 0846   AST 18 06/10/2023 0846   AST 19 11/05/2022 1215   ALT 10 06/10/2023 0846   ALT 12 11/05/2022 1215   ALKPHOS 62 06/10/2023 0846   BILITOT 0.5 06/10/2023 0846   BILITOT 0.4 11/05/2022 1215   GFRNONAA 43 (L) 06/10/2023 0846   GFRNONAA 45 (L) 11/05/2022 1215   GFRAA 47 (L) 03/28/2020 0933   GFRAA 42 (L) 12/21/2019 0850    No results found for: "SPEP", "UPEP"  Lab Results  Component Value Date   WBC 6.0 06/10/2023   NEUTROABS 3.5 06/10/2023   HGB 10.9 (L) 06/10/2023   HCT 30.6 (L) 06/10/2023   MCV 89.0 06/10/2023   PLT 251 06/10/2023      Chemistry      Component Value Date/Time   NA 136 06/10/2023  0846   NA 139 02/13/2017 1512   K 3.7 06/10/2023 0846   K 4.2 02/13/2017 1512   CL 100 06/10/2023 0846   CO2 28 06/10/2023 0846   CO2 28 02/13/2017 1512   BUN 20 06/10/2023 0846   BUN 25.2 02/13/2017 1512   CREATININE 1.25 (H) 06/10/2023 0846   CREATININE 1.20 (H) 11/05/2022 1215   CREATININE 1.2 (H) 02/13/2017 1512      Component Value Date/Time   CALCIUM 8.8 (L) 06/10/2023 0846   CALCIUM 10.0 02/13/2017 1512   ALKPHOS 62 06/10/2023 0846   AST 18 06/10/2023 0846   AST 19 11/05/2022 1215   ALT 10 06/10/2023 0846   ALT 12 11/05/2022 1215   BILITOT 0.5 06/10/2023 0846   BILITOT 0.4 11/05/2022 1215

## 2023-06-10 NOTE — Assessment & Plan Note (Signed)
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

## 2023-06-10 NOTE — Assessment & Plan Note (Signed)
Renal function is stable. Monitor closely every other visit

## 2023-06-11 ENCOUNTER — Other Ambulatory Visit: Payer: Self-pay

## 2023-06-27 ENCOUNTER — Ambulatory Visit (HOSPITAL_COMMUNITY)
Admission: RE | Admit: 2023-06-27 | Discharge: 2023-06-27 | Disposition: A | Discharge status: Home / Self Care | Payer: Medicare HMO | Source: Ambulatory Visit | Attending: Hematology and Oncology | Admitting: Hematology and Oncology

## 2023-06-27 DIAGNOSIS — C773 Secondary and unspecified malignant neoplasm of axilla and upper limb lymph nodes: Secondary | ICD-10-CM | POA: Diagnosis not present

## 2023-06-27 DIAGNOSIS — I351 Nonrheumatic aortic (valve) insufficiency: Secondary | ICD-10-CM | POA: Diagnosis not present

## 2023-06-27 DIAGNOSIS — I10 Essential (primary) hypertension: Secondary | ICD-10-CM | POA: Diagnosis not present

## 2023-06-27 DIAGNOSIS — E785 Hyperlipidemia, unspecified: Secondary | ICD-10-CM | POA: Diagnosis not present

## 2023-06-27 DIAGNOSIS — Z0189 Encounter for other specified special examinations: Secondary | ICD-10-CM

## 2023-06-27 DIAGNOSIS — C541 Malignant neoplasm of endometrium: Secondary | ICD-10-CM

## 2023-06-27 LAB — ECHOCARDIOGRAM COMPLETE
Area-P 1/2: 3.23 cm2
S' Lateral: 2.1 cm

## 2023-06-30 DIAGNOSIS — L570 Actinic keratosis: Secondary | ICD-10-CM | POA: Diagnosis not present

## 2023-06-30 DIAGNOSIS — Z85828 Personal history of other malignant neoplasm of skin: Secondary | ICD-10-CM | POA: Diagnosis not present

## 2023-06-30 DIAGNOSIS — L821 Other seborrheic keratosis: Secondary | ICD-10-CM | POA: Diagnosis not present

## 2023-07-01 ENCOUNTER — Inpatient Hospital Stay: Payer: Medicare HMO | Attending: Gynecologic Oncology

## 2023-07-01 ENCOUNTER — Inpatient Hospital Stay: Payer: Medicare HMO

## 2023-07-01 VITALS — BP 146/65 | HR 80 | Temp 97.9°F | Resp 16 | Wt 134.5 lb

## 2023-07-01 DIAGNOSIS — Z5112 Encounter for antineoplastic immunotherapy: Secondary | ICD-10-CM | POA: Diagnosis not present

## 2023-07-01 DIAGNOSIS — C541 Malignant neoplasm of endometrium: Secondary | ICD-10-CM | POA: Diagnosis not present

## 2023-07-01 DIAGNOSIS — C773 Secondary and unspecified malignant neoplasm of axilla and upper limb lymph nodes: Secondary | ICD-10-CM | POA: Diagnosis not present

## 2023-07-01 DIAGNOSIS — Z7189 Other specified counseling: Secondary | ICD-10-CM

## 2023-07-01 DIAGNOSIS — N183 Chronic kidney disease, stage 3 unspecified: Secondary | ICD-10-CM

## 2023-07-01 LAB — COMPREHENSIVE METABOLIC PANEL
ALT: 11 U/L (ref 0–44)
AST: 19 U/L (ref 15–41)
Albumin: 4.1 g/dL (ref 3.5–5.0)
Alkaline Phosphatase: 68 U/L (ref 38–126)
Anion gap: 8 (ref 5–15)
BUN: 24 mg/dL — ABNORMAL HIGH (ref 8–23)
CO2: 27 mmol/L (ref 22–32)
Calcium: 8.7 mg/dL — ABNORMAL LOW (ref 8.9–10.3)
Chloride: 101 mmol/L (ref 98–111)
Creatinine, Ser: 1.23 mg/dL — ABNORMAL HIGH (ref 0.44–1.00)
GFR, Estimated: 44 mL/min — ABNORMAL LOW (ref >60)
Glucose, Bld: 121 mg/dL — ABNORMAL HIGH (ref 70–99)
Potassium: 3.3 mmol/L — ABNORMAL LOW (ref 3.5–5.1)
Sodium: 136 mmol/L (ref 135–145)
Total Bilirubin: 0.4 mg/dL (ref <1.2)
Total Protein: 6.4 g/dL — ABNORMAL LOW (ref 6.5–8.1)

## 2023-07-01 LAB — CBC WITH DIFFERENTIAL/PLATELET
Abs Immature Granulocytes: 0.02 10*3/uL (ref 0.00–0.07)
Basophils Absolute: 0.1 10*3/uL (ref 0.0–0.1)
Basophils Relative: 1 %
Eosinophils Absolute: 0.2 10*3/uL (ref 0.0–0.5)
Eosinophils Relative: 4 %
HCT: 32.2 % — ABNORMAL LOW (ref 36.0–46.0)
Hemoglobin: 11.3 g/dL — ABNORMAL LOW (ref 12.0–15.0)
Immature Granulocytes: 0 %
Lymphocytes Relative: 23 %
Lymphs Abs: 1.5 10*3/uL (ref 0.7–4.0)
MCH: 31.1 pg (ref 26.0–34.0)
MCHC: 35.1 g/dL (ref 30.0–36.0)
MCV: 88.7 fL (ref 80.0–100.0)
Monocytes Absolute: 0.5 10*3/uL (ref 0.1–1.0)
Monocytes Relative: 9 %
Neutro Abs: 3.9 10*3/uL (ref 1.7–7.7)
Neutrophils Relative %: 63 %
Platelets: 255 10*3/uL (ref 150–400)
RBC: 3.63 MIL/uL — ABNORMAL LOW (ref 3.87–5.11)
RDW: 11.9 % (ref 11.5–15.5)
WBC: 6.2 10*3/uL (ref 4.0–10.5)
nRBC: 0 % (ref 0.0–0.2)

## 2023-07-01 MED ORDER — HEPARIN SOD (PORK) LOCK FLUSH 100 UNIT/ML IV SOLN
500.0000 [IU] | Freq: Once | INTRAVENOUS | Status: AC | PRN
  Administered 2023-07-01: 500 [IU]

## 2023-07-01 MED ORDER — SODIUM CHLORIDE 0.9% FLUSH
10.0000 mL | Freq: Once | INTRAVENOUS | Status: AC
  Administered 2023-07-01: 10 mL

## 2023-07-01 MED ORDER — SODIUM CHLORIDE 0.9 % IV SOLN: Freq: Once | INTRAVENOUS | Status: AC

## 2023-07-01 MED ORDER — SODIUM CHLORIDE 0.9 % IV SOLN
6.0000 mg/kg | Freq: Once | INTRAVENOUS | Status: AC
  Administered 2023-07-01: 357 mg via INTRAVENOUS
  Filled 2023-07-01: qty 17

## 2023-07-01 MED ORDER — SODIUM CHLORIDE 0.9% FLUSH
10.0000 mL | INTRAVENOUS | Status: DC | PRN
  Administered 2023-07-01: 10 mL

## 2023-07-04 ENCOUNTER — Other Ambulatory Visit: Payer: Self-pay

## 2023-07-11 ENCOUNTER — Telehealth: Payer: Self-pay | Admitting: *Deleted

## 2023-07-11 NOTE — Telephone Encounter (Signed)
Patient called the office to reschedule her appt. With Dr. Pricilla Holm from Friday, January 3 rd and new appt. Was made for Friday, February 14 at 3:30 pm. Pt agreed to date and time.

## 2023-07-13 ENCOUNTER — Other Ambulatory Visit: Payer: Self-pay

## 2023-07-18 ENCOUNTER — Inpatient Hospital Stay: Payer: Medicare HMO | Admitting: Gynecologic Oncology

## 2023-07-22 ENCOUNTER — Inpatient Hospital Stay: Payer: Medicare HMO | Admitting: Hematology and Oncology

## 2023-07-22 ENCOUNTER — Encounter: Payer: Self-pay | Admitting: Hematology and Oncology

## 2023-07-22 ENCOUNTER — Inpatient Hospital Stay: Payer: Medicare HMO | Attending: Gynecologic Oncology

## 2023-07-22 ENCOUNTER — Inpatient Hospital Stay: Payer: Medicare HMO

## 2023-07-22 VITALS — BP 182/62 | HR 65 | Temp 98.3°F | Resp 18 | Ht 62.0 in | Wt 134.8 lb

## 2023-07-22 VITALS — BP 157/63

## 2023-07-22 DIAGNOSIS — Z90710 Acquired absence of both cervix and uterus: Secondary | ICD-10-CM | POA: Insufficient documentation

## 2023-07-22 DIAGNOSIS — Z7982 Long term (current) use of aspirin: Secondary | ICD-10-CM | POA: Diagnosis not present

## 2023-07-22 DIAGNOSIS — C541 Malignant neoplasm of endometrium: Secondary | ICD-10-CM | POA: Diagnosis not present

## 2023-07-22 DIAGNOSIS — Z9221 Personal history of antineoplastic chemotherapy: Secondary | ICD-10-CM | POA: Insufficient documentation

## 2023-07-22 DIAGNOSIS — Z7189 Other specified counseling: Secondary | ICD-10-CM

## 2023-07-22 DIAGNOSIS — N183 Chronic kidney disease, stage 3 unspecified: Secondary | ICD-10-CM | POA: Insufficient documentation

## 2023-07-22 DIAGNOSIS — C773 Secondary and unspecified malignant neoplasm of axilla and upper limb lymph nodes: Secondary | ICD-10-CM | POA: Diagnosis not present

## 2023-07-22 DIAGNOSIS — Z79899 Other long term (current) drug therapy: Secondary | ICD-10-CM | POA: Diagnosis not present

## 2023-07-22 DIAGNOSIS — Z5112 Encounter for antineoplastic immunotherapy: Secondary | ICD-10-CM | POA: Diagnosis not present

## 2023-07-22 LAB — COMPREHENSIVE METABOLIC PANEL
ALT: 11 U/L (ref 0–44)
AST: 18 U/L (ref 15–41)
Albumin: 4.1 g/dL (ref 3.5–5.0)
Alkaline Phosphatase: 69 U/L (ref 38–126)
Anion gap: 8 (ref 5–15)
BUN: 25 mg/dL — ABNORMAL HIGH (ref 8–23)
CO2: 27 mmol/L (ref 22–32)
Calcium: 9.3 mg/dL (ref 8.9–10.3)
Chloride: 103 mmol/L (ref 98–111)
Creatinine, Ser: 1.25 mg/dL — ABNORMAL HIGH (ref 0.44–1.00)
GFR, Estimated: 43 mL/min — ABNORMAL LOW (ref >60)
Glucose, Bld: 99 mg/dL (ref 70–99)
Potassium: 3.7 mmol/L (ref 3.5–5.1)
Sodium: 138 mmol/L (ref 135–145)
Total Bilirubin: 0.4 mg/dL (ref 0.0–1.2)
Total Protein: 6.8 g/dL (ref 6.5–8.1)

## 2023-07-22 LAB — CBC WITH DIFFERENTIAL/PLATELET
Abs Immature Granulocytes: 0.02 10*3/uL (ref 0.00–0.07)
Basophils Absolute: 0.1 10*3/uL (ref 0.0–0.1)
Basophils Relative: 1 %
Eosinophils Absolute: 0.3 10*3/uL (ref 0.0–0.5)
Eosinophils Relative: 4 %
HCT: 30.7 % — ABNORMAL LOW (ref 36.0–46.0)
Hemoglobin: 10.7 g/dL — ABNORMAL LOW (ref 12.0–15.0)
Immature Granulocytes: 0 %
Lymphocytes Relative: 22 %
Lymphs Abs: 1.4 10*3/uL (ref 0.7–4.0)
MCH: 30.8 pg (ref 26.0–34.0)
MCHC: 34.9 g/dL (ref 30.0–36.0)
MCV: 88.5 fL (ref 80.0–100.0)
Monocytes Absolute: 0.5 10*3/uL (ref 0.1–1.0)
Monocytes Relative: 9 %
Neutro Abs: 4.1 10*3/uL (ref 1.7–7.7)
Neutrophils Relative %: 64 %
Platelets: 240 10*3/uL (ref 150–400)
RBC: 3.47 MIL/uL — ABNORMAL LOW (ref 3.87–5.11)
RDW: 12.1 % (ref 11.5–15.5)
WBC: 6.3 10*3/uL (ref 4.0–10.5)
nRBC: 0 % (ref 0.0–0.2)

## 2023-07-22 MED ORDER — SODIUM CHLORIDE 0.9 % IV SOLN: Freq: Once | INTRAVENOUS | Status: AC

## 2023-07-22 MED ORDER — SODIUM CHLORIDE 0.9% FLUSH
10.0000 mL | Freq: Once | INTRAVENOUS | Status: AC
  Administered 2023-07-22: 10 mL

## 2023-07-22 MED ORDER — SODIUM CHLORIDE 0.9 % IV SOLN
6.0000 mg/kg | Freq: Once | INTRAVENOUS | Status: AC
  Administered 2023-07-22: 357 mg via INTRAVENOUS
  Filled 2023-07-22: qty 17

## 2023-07-22 MED ORDER — SODIUM CHLORIDE 0.9% FLUSH
10.0000 mL | INTRAVENOUS | Status: DC | PRN
  Administered 2023-07-22: 10 mL

## 2023-07-22 MED ORDER — HEPARIN SOD (PORK) LOCK FLUSH 100 UNIT/ML IV SOLN
500.0000 [IU] | Freq: Once | INTRAVENOUS | Status: AC | PRN
  Administered 2023-07-22: 500 [IU]

## 2023-07-22 NOTE — Patient Instructions (Signed)
 [facility] - A DEPT OF [facility]   Discharge Instructions: Thank you for choosing [facility] to provide your oncology and hematology care.   If you have a lab appointment with the Cancer Center, please go directly to the Cancer Center and check in at the registration area.   Wear comfortable clothing and clothing appropriate for easy access to any Portacath or PICC line.   We strive to give you quality time with your provider. You may need to reschedule your appointment if you arrive late (15 or more minutes).  Arriving late affects you and other patients whose appointments are after yours.  Also, if you miss three or more appointments without notifying the office, you may be dismissed from the clinic at the provider's discretion.      For prescription refill requests, have your pharmacy contact our office and allow 72 hours for refills to be completed.    Today you received the following chemotherapy and/or immunotherapy agents: Trastuzumab (Kanjinti)      To help prevent nausea and vomiting after your treatment, we encourage you to take your nausea medication as directed.  BELOW ARE SYMPTOMS THAT SHOULD BE REPORTED IMMEDIATELY: *FEVER GREATER THAN 100.4 F (38 C) OR HIGHER *CHILLS OR SWEATING *NAUSEA AND VOMITING THAT IS NOT CONTROLLED WITH YOUR NAUSEA MEDICATION *UNUSUAL SHORTNESS OF BREATH *UNUSUAL BRUISING OR BLEEDING *URINARY PROBLEMS (pain or burning when urinating, or frequent urination) *BOWEL PROBLEMS (unusual diarrhea, constipation, pain near the anus) TENDERNESS IN MOUTH AND THROAT WITH OR WITHOUT PRESENCE OF ULCERS (sore throat, sores in mouth, or a toothache) UNUSUAL RASH, SWELLING OR PAIN  UNUSUAL VAGINAL DISCHARGE OR ITCHING   Items with * indicate a potential emergency and should be followed up as soon as possible or go to the Emergency Department if any problems should occur.  Please show the CHEMOTHERAPY ALERT CARD or  IMMUNOTHERAPY ALERT CARD at check-in to the Emergency Department and triage nurse.  Should you have questions after your visit or need to cancel or reschedule your appointment, please contact [facility] - A DEPT [facility]  Dept: [phone]  and follow the prompts.  Office hours are 8:00 a.m. to 4:30 p.m. Monday - Friday. Please note that voicemails left after 4:00 p.m. may not be returned until the following business day.  We are closed weekends and major holidays. You have access to a nurse at all times for urgent questions. Please call the main number to the clinic Dept: [phone] and follow the prompts.   For any non-urgent questions, you may also contact your provider using MyChart. We now offer e-Visits for anyone 33 and older to request care online for non-urgent symptoms. For details visit [email or web address].   Also download the MyChart app! Go to the app store, search "MyChart", open the app, select [facility], and log in with your MyChart username and password.

## 2023-07-22 NOTE — Assessment & Plan Note (Signed)
 Renal function is stable. Monitor closely every other visit

## 2023-07-22 NOTE — Progress Notes (Signed)
 [facility] OFFICE PROGRESS NOTE  Patient Care Team: Shepard Ade, [name]  ASSESSMENT & PLAN:  Endometrial cancer [facility]) Her last imaging showed no evidence of cancer recurrence, due in April 2025 With stability of her cardiac function, I will also change echocardiogram monitoring to once every 4 months, due next April She will continue trastuzumab  indefinitely   CKD (chronic kidney disease), stage III (HCC) Renal function is stable. Monitor closely every other visit  No orders of the defined types were placed in this encounter.   All questions were answered. The patient knows to call the clinic with any problems, questions or concerns. The total time spent in the appointment was 20 minutes encounter with patients including review of chart and various tests results, discussions about plan of care and coordination of care plan   Almarie Bedford, MD 07/22/2023 12:25 PM  INTERVAL HISTORY: Please see below for problem oriented charting. she returns for surveillance follow-up while on maintenance trastuzumab  She is doing well She have no side effects from treatment Discussed timing of next echocardiogram and CT imaging study  REVIEW OF SYSTEMS:   Constitutional: Denies fevers, chills or abnormal weight loss Eyes: Denies blurriness of vision Ears, nose, mouth, throat, and face: Denies mucositis or sore throat Respiratory: Denies cough, dyspnea or wheezes Cardiovascular: Denies palpitation, chest discomfort or lower extremity swelling Gastrointestinal:  Denies nausea, heartburn or change in bowel habits Skin: Denies abnormal skin rashes Lymphatics: Denies new lymphadenopathy or easy bruising Neurological:Denies numbness, tingling or new weaknesses Behavioral/Psych: Mood is stable, no new changes  All other systems were reviewed with the patient and are negative.  I have reviewed the past medical history, past surgical history, social  history and family history with the patient and they are unchanged from previous note.  ALLERGIES:  is allergic to trazodone hcl.  MEDICATIONS:  Current Outpatient Medications  Medication Sig Dispense Refill   acetaminophen  (TYLENOL ) 500 MG tablet Take 1,000 mg by mouth every 6 (six) hours as needed.     amLODipine (NORVASC) 5 MG tablet Take 5 mg by mouth daily.     aspirin  EC 81 MG tablet Take 81 mg by mouth daily.     atorvastatin  (LIPITOR) 20 MG tablet Take 20 mg by mouth daily.     citalopram  (CELEXA ) 20 MG tablet Take 20 mg by mouth daily.     estradiol  (ESTRACE ) 0.1 MG/GM vaginal cream Place 1 Applicatorful vaginally 3 (three) times a week. 42.5 g 12   levothyroxine  (SYNTHROID , LEVOTHROID) 100 MCG tablet Take 100 mcg by mouth daily before breakfast.      lidocaine -prilocaine  (EMLA ) cream Apply 1 Application topically as needed. 30 g 3   losartan-hydrochlorothiazide  (HYZAAR) 100-25 MG tablet      metoprolol succinate (TOPROL-XL) 25 MG 24 hr tablet Take 25 mg by mouth daily.     Multiple Vitamin (MULTIVITAMIN WITH MINERALS) TABS Take 1 tablet by mouth daily.     olmesartan-hydrochlorothiazide  (BENICAR HCT) 40-25 MG tablet Take 1 tablet by mouth daily.     pantoprazole  (PROTONIX ) 40 MG tablet Take 40 mg by mouth daily.     valACYclovir (VALTREX) 1000 MG tablet valacyclovir 1 gram tablet     No current facility-administered medications for this visit.   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 Her cancer was treated with surgery but no radiation or chemotherapy.   Serous endometrial cancer, MSI stable ER positive, PR neg, Her2/neu 3+      Endometrial cancer (HCC)  08/29/2016 Pathology Results   Endometrium, biopsy - HIGH GRADE ENDOMETRIAL CARCINOMA, SEE COMMENT. Microscopic Comment The sections show multiple fragments of adenocarcinoma displaying glandular and papillary patterns associated with high grade cytomorphology characterized by nuclear pleomorphism, prominent nucleoli and brisk mitosis. Immunohistochemical stains show that the tumor cells are positive for vimentin, p16, p53 with increased Ki-67 expression. Estrogen and progesterone receptor stains show patchy weak positivity. No significant positivity is seen with CEA. The findings are consistent with high grade endometrial carcinoma and the overall morphology and phenotypic features favor serous carcinoma.   08/29/2016 Initial Diagnosis   She presented with postmenopausal bleeding   10/03/2016 Imaging   CT C/A/P 09/2016 IMPRESSION: 1. Thickening of the endometrial canal up to 19 mm in fundus, presumably corresponding to the patient's reported endometrial carcinoma. 2. Multiple tiny pulmonary nodules scattered throughout the lungs bilaterally measuring 4 mm or less in size. Nodules of this size are typically considered statistically likely benign. In the setting of known primary malignancy, metastatic disease to the lungs is not excluded, but is not strongly favored on today's examination. Attention on followup studies is recommended to ensure the stability or resolution of these nodules. 3. Subcentimeter low-attenuation lesion in the central aspect of segment 8 of the liver is too small to characterize. This is statistically likely a tiny cyst, but warrants attention on follow-up studies to  exclude the possibility of a solitary hepatic metastasis. 4. 1.5 x 1.5 x 1.7 cm well-circumscribed lesion in the proximal stomach. This is of uncertain etiology and significance, and could represent a benign gastric polyp, however, further evaluation with nonemergent endoscopy is suggested in the near future for further evaluation. 5. **An incidental finding of potential clinical significance has been found. 1.1 x 1.6 cm thyroid  nodule in the inferior aspect of the right lobe of the thyroid  gland. Follow-up evaluation with nonemergent thyroid  ultrasound is recommended in the near future to better evaluate this finding. This recommendation follows ACR consensuss guidelines: Managing Incidental Thyroid  Nodules Detected on Imaging: White Paper of the ACR Incidental Thyroid  Findings Committee. J Am Coll Radiol 2015;12(2):143-150.** 6. Aortic atherosclerosis, in addition to left anterior descending coronary artery disease   10/22/2016 Surgery   Robotic assisted total hysterectomy, BSO and bilateral pelvic lymphadenectomy  Final pathology revealed a 3cm polyp containing serous carcinoma but with no myometrial invasion, no LVSI and negative nodes.  Stage IA Uterine serous cancer   10/22/2016 Pathology Results   1. Lymph nodes, regional resection, right pelvic - SIX BENIGN LYMPH NODES (0/6). 2. Lymph nodes, regional resection, left pelvic - SEVEN BENIGN LYMPH NODES (0/7). 3. Uterus +/- tubes/ovaries, neoplastic, with right ovary and fallopian tube ENDOMYOMETRIUM - SEROUS CARCINOMA ARISING WITHIN AN ENDOMETRIAL POLYP - NO MYOMETRIAL INVASION IDENTIFIED - ADENOMYOSIS - LEIOMYOMA (1 CM) -  SEE ONCOLOGY TABLE AND COMMENT CERVIX - CARCINOMA FOCALLY INVOLVES ENDOCERVICAL GLANDS - NABOTHIAN CYSTS RIGHT ADNEXA - BENIGN OVARY AND FALLOPIAN TUBE - NO CARCINOMA IDENTIFIED 4. Cul-de-sac biopsy - MESOTHELIAL HYPERPLASIA Microscopic Comment 3. ONCOLOGY TABLE-UTERUS, CARCINOMA OR CARCINOSARCOMA Specimen:  Uterus, right fallopian tube and ovary Procedure: Total hysterectomy and right salpingo-oophorectomy Lymph node sampling performed: Bilateral pelvic regional resection Specimen integrity: Intact Maximum tumor size: 3 cm (polyp) Histologic type: Serous carcinoma Grade: High grade Myometrial invasion: Not identified Cervical stromal involvement: No, focal endocervical gland involvement Extent of involvement of other organs: Not identified Lymph - vascular invasion: Not identified Peritoneal washings: N/A Lymph nodes: Examined: 0 Sentinel 13 Non-sentinel 13 Total Lymph nodes with metastasis: 0 Isolated tumor cells (< 0.2 mm): 0 Micrometastasis: (> 0.2 mm and < 2.0 mm): 0 Macrometastasis: (> 2.0 mm): 0 Extracapsular extension: N/A Pelvic lymph nodes: 0 involved of 13 lymph nodes. Para-aortic lymph nodes: No para-aortic nodes submitted TNM code: pT1a, pNX FIGO Stage (based on pathologic findings, needs clinical correlation): IA Comment: Immunohistochemistry for cytokeratin AE1/AE3 is performed on all of the lymph nodes (parts 1 & 2) and no metastatic carcinoma is identified.   07/04/2017 Genetic Testing   The patient had genetic testing due to a personal history of breast and uterine cancer, and a family history of stomach cancer.  The Multi-Cancer Panel was ordered. The Multi-Cancer Panel offered by [facility] includes sequencing and/or deletion duplication testing of the following 83 genes: ALK, APC, ATM, AXIN2,BAP1,  BARD1, BLM, BMPR1A, BRCA1, BRCA2, BRIP1, CASR, CDC73, CDH1, CDK4, CDKN1B, CDKN1C, CDKN2A (p14ARF), CDKN2A (p16INK4a), CEBPA, CHEK2, CTNNA1, DICER1, DIS3L2, EGFR (c.2369C>T, p.Thr790Met variant only), EPCAM (Deletion/duplication testing only), FH, FLCN, GATA2, GPC3, GREM1 (Promoter region deletion/duplication testing only), HOXB13 (c.251G>A, p.Gly84Glu), HRAS, KIT, MAX, MEN1, MET, MITF (c.952G>A, p.Glu318Lys variant only), MLH1, MSH2, MSH3, MSH6, MUTYH, NBN, NF1, NF2, NTHL1, PALB2,  PDGFRA, PHOX2B, PMS2, POLD1, POLE, POT1, PRKAR1A, PTCH1, PTEN, RAD50, RAD51C, RAD51D, RB1, RECQL4, RET, RUNX1, SDHAF2, SDHA (sequence changes only), SDHB, SDHC, SDHD, SMAD4, SMARCA4, SMARCB1, SMARCE1, STK11, SUFU, TERC, TERT, TMEM127, TP53, TSC1, TSC2, VHL, WRN and WT1.   Results: No pathogenic variants were identified.  A variant of uncertain significance in the gene APC was identified.  c.791A>G (p.Gln264Arg).  The date of this test report is 07/04/2017.     Genetic Testing   Patient has genetic testing done for MSI. 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    12/03/2017 Imaging   CT Chest/Abd/Pelvis to follow pulmonary nodule and gastric mass IMPRESSION: 1. Stable CT of the chest. Small pulmonary nodules are unchanged when compared with previous exam. 2. No new findings identified. 3. Subcentimeter low-attenuation lesions within the liver are remain too small to characterize but are stable from prior exam. 4. Persistent indeterminate low-attenuation structure within the proximal stomach is unchanged measuring 1.4 cm. Correlation with direct visualization is advised   06/30/2018 Imaging   CT CHEST Lungs/Pleura: Stable scattered sub-cm pulmonary nodules are again seen bilaterally and are stable compared to previous studies. No new or enlarging pulmonary nodules or masses identified. No evidence of pulmonary infiltrate or pleural effusion.   08/19/2018 Echocardiogram   [place] done in [place]: EF 60-65%. Mild impaired relaxation. (report scanned)   09/17/2018 Relapse/Recurrence   Presented with c/o vagina discharge.  Lesion noted at the left vaginal apex 6mm lesion removed Path c/w high grade serous cancer   09/17/2018 Pathology Results   Vagina, biopsy, left apex - HIGH GRADE SEROUS CARCINOMA.  09/28/2018 PET scan   1. Two hypermetabolic axial lymph nodes. Unusual site for metastatic endometrial carcinoma however the activity is more intense than typically  seen in reactive adenopathy. Suggest ultrasound-guided percutaneous biopsy of the larger RIGHT axial lymph node 2. No evidence of local recurrence at the vaginal cuff.  3. No metastatic adenopathy in the abdomen or pelvis. 4. Stable small pulmonary nodules   10/09/2018 Pathology Results   Lymph node, needle/core biopsy, right axilla - METASTATIC CARCINOMA, SEE COMMENT. Microscopic Comment The carcinoma appears high grade. Immunohistochemistry is positive for cytokeratin 7, PAX-8, and ER. Cytokeratin 20, CDX-2, PR, and GATA-3 are negative. The findings along with the history are consistent with a gynecologic primary.    10/09/2018 Procedure   Ultrasound-guided core biopsies of a right axillary lymph node.   10/14/2018 Cancer Staging   Staging form: Corpus Uteri - Carcinoma and Carcinosarcoma, AJCC 8th Edition - Clinical: Stage IVB (cT1, cN0, pM1) - Signed by Lonn Hicks, MD on 10/14/2018   10/19/2018 Imaging   Placement of single lumen port a cath via right internal jugular vein. The catheter tip lies at the cavo-atrial junction. A power injectable port a cath was placed and is ready for immediate use.    10/21/2018 -  Chemotherapy   The patient had carboplatin  and taxol  for treatment   11/11/2018 - 03/12/2022 Chemotherapy   Patient is on Treatment Plan : BREAST Trastuzumab  q21d     12/21/2018 PET scan   1. Interval resolution of hypermetabolic right axillary lymph nodes. No metabolic findings highly suspicious for recurrent metastatic disease. 2. New mild hypermetabolism within a borderline prominent portacaval lymph node, nonspecific. While a reactive node is favored, a lymph node metastasis cannot be entirely excluded. Suggest attention to this lymph node on follow-up PET-CT in 3-6 months. 3. Nonspecific new hypermetabolism at the ileocecal valve, more likely physiologic given absence of CT correlate. 4. Scattered subcentimeter pulmonary nodules are all stable and below PET resolution, more likely  benign, continued CT surveillance advised. 5. Chronic findings include: Aortic Atherosclerosis (ICD10-I70.0). Marked diffuse colonic diverticulosis. Coronary atherosclerosis.     12/28/2018 Echocardiogram   1. The left ventricle has normal systolic function with an ejection fraction of 60-65%. The cavity size was normal. Left ventricular diastolic Doppler parameters are consistent with impaired relaxation.  2. GLS recorded as -10.7 but LV appears hyperdynamic and tracking of endocardium appears poor.  3. The right ventricle has normal systolic function. The cavity was normal. There is no increase in right ventricular wall thickness.  4. Mild thickening of the mitral valve leaflet.  5. The aortic valve was not well visualized. Mild thickening of the aortic valve. Aortic valve regurgitation is trivial by color flow Doppler.   03/23/2019 Imaging   PET 1. No findings of hypermetabolic residual/recurrent or metastatic disease. 2. Similar low-level hypermetabolism within normal sized portocaval and right inguinal nodes, favored to be reactive. 3. Ongoing stability of small bilateral pulmonary nodules, favored to be benign. Below PET resolution. 4. Coronary artery atherosclerosis. Aortic Atherosclerosis   03/26/2019 Echocardiogram   1. The left ventricle has normal systolic function, with an ejection fraction of 55-60%. The cavity size was normal. Left ventricular diastolic Doppler parameters are consistent with impaired relaxation.  2. The right ventricle has normal systolic function. The cavity was normal.  3. The mitral valve is abnormal. Mild thickening of the mitral valve leaflet. There is mild mitral annular calcification present.  4. The tricuspid valve is grossly normal.  5. The aortic valve  is tricuspid. Mild calcification of the aortic valve. No stenosis of the aortic valve.  6. The aorta is normal unless otherwise noted.  7. Normal LV systolic function; grade 1 diastolic dysfunction;  GLS-15.2%.   06/30/2019 Imaging   Chest Impression:   1. No evidence thoracic metastasis. 2. Stable small benign-appearing pulmonary nodules   Abdomen / Pelvis Impression:   1. No evidence of metastatic disease in the abdomen pelvis. 2.  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 3.  Aortic Atherosclerosis (ICD10-I70.0).   06/30/2019 Echocardiogram    1. Left ventricular ejection fraction, by visual estimation, is 60 to 65%. The left ventricle has normal function. There is no left ventricular hypertrophy.  2. Left ventricular diastolic parameters are consistent with Grade I diastolic dysfunction (impaired relaxation).  3. The left ventricle has no regional wall motion abnormalities.  4. Global right ventricle has normal systolic function.The right ventricular size is normal. No increase in right ventricular wall thickness.  5. Left atrial size was mild-moderately dilated.  6. Right atrial size was normal.  7. The mitral valve is normal in structure. Trivial mitral valve regurgitation.  8. The tricuspid valve is normal in structure. Tricuspid valve regurgitation is trivial.  9. The aortic valve is normal in structure. Aortic valve regurgitation is trivial. No evidence of aortic valve sclerosis or stenosis. 10. The pulmonic valve was grossly normal. Pulmonic valve regurgitation is not visualized. 11. Mildly elevated pulmonary artery systolic pressure. 12. The inferior vena cava is normal in size with greater than 50% respiratory variability, suggesting right atrial pressure of 3 mmHg. 13. The average left ventricular global longitudinal strain is -12.5 %. GLS underestimated due to poor endocardial tracking.     09/27/2019 Imaging   1. Multiple small, nonspecific pulmonary nodules are again noted. The previously noted lung nodules are unchanged in size from previous exam. There is a new lung nodule within the medial right lower lobe which is nonspecific measure 4 mm. Attention  at follow-up imaging is recommended. 2. No evidence of metastatic disease within the abdomen or pelvis. 3. Coronary artery calcifications.  The 4.  Aortic Atherosclerosis (ICD10-I70.0).   09/30/2019 Echocardiogram    1. Left ventricular ejection fraction, by estimation, is 60 to 65%. The left ventricle has normal function. The left ventricle has no regional wall motion abnormalities. Left ventricular diastolic parameters are consistent with Grade I diastolic dysfunction (impaired relaxation).  2. Right ventricular systolic function is normal. The right ventricular size is normal. There is normal pulmonary artery systolic pressure. The estimated right ventricular systolic pressure is 24.2 mmHg.  3. The mitral valve is normal in structure. No evidence of mitral valve regurgitation. No evidence of mitral stenosis.  4. The aortic valve is normal in structure. Aortic valve regurgitation is trivial. No aortic stenosis is present.  5. The inferior vena cava is normal in size with greater than 50% respiratory variability, suggesting right atrial pressure of 3 mmHg.     01/07/2020 Echocardiogram    1. Normal LV function; grade 1 diastolic dysfunction; mild AI; GLS -21%.  2. Left ventricular ejection fraction, by estimation, is 55 to 60%. The left ventricle has normal function. The left ventricle has no regional wall motion abnormalities. Left ventricular diastolic parameters are consistent with Grade I diastolic dysfunction (impaired relaxation).  3. Right ventricular systolic function is normal. The right ventricular size is normal.  4. The mitral valve is normal in structure. Trivial mitral valve regurgitation. No evidence of mitral stenosis.  5. The  aortic valve is tricuspid. Aortic valve regurgitation is mild. Mild aortic valve sclerosis is present, with no evidence of aortic valve stenosis.  6. The inferior vena cava is normal in size with greater than 50% respiratory variability, suggesting right  atrial pressure of 3 mmHg.     03/27/2020 Imaging   1. Status post hysterectomy. No evidence of recurrent or metastatic disease in the abdomen or pelvis. 2. Multiple small pulmonary nodules in the lung bases are unchanged to the extent that they are included on current examination and remain most likely sequelae of prior infection or inflammation. Attention on follow-up. 3. Aortic Atherosclerosis (ICD10-I70.0).   04/17/2020 Echocardiogram   1. Normal LVEF, normal and unchanged GLS: -20.2%.  2. Left ventricular ejection fraction, by estimation, is 60 to 65%. The left ventricle has normal function. The left ventricle has no regional wall motion abnormalities. There is mild concentric left ventricular hypertrophy. Left ventricular diastolic parameters are consistent with Grade I diastolic dysfunction (impaired relaxation). The average left ventricular global longitudinal strain is -20.2 %. The global longitudinal strain is normal.  3. Right ventricular systolic function is normal. The right ventricular size is normal.  4. The mitral valve is normal in structure. Mild mitral valve regurgitation. No evidence of mitral stenosis.  5. The aortic valve is normal in structure. There is mild calcification of the aortic valve. There is mild thickening of the aortic valve. Aortic valve regurgitation is mild. No aortic stenosis is present.  6. The inferior vena cava is normal in size with greater than 50% respiratory variability, suggesting right atrial pressure of 3 mmHg.   08/02/2020 Echocardiogram   1. Left ventricular ejection fraction, by estimation, is 55 to 60%. The left ventricle has normal function. The left ventricle has no regional wall motion abnormalities. Left ventricular diastolic parameters are consistent with Grade I diastolic dysfunction (impaired relaxation). The average left ventricular global longitudinal strain is -27.0 %. The global longitudinal strain is normal.  2. Right ventricular  systolic function is normal. The right ventricular size is normal. Tricuspid regurgitation signal is inadequate for assessing PA pressure.  3. The mitral valve is grossly normal. Trivial mitral valve regurgitation. No evidence of mitral stenosis.  4. The aortic valve is tricuspid. Aortic valve regurgitation is mild. No aortic stenosis is present.  5. The inferior vena cava is normal in size with greater than 50% respiratory variability, suggesting right atrial pressure of 3 mmHg.   09/19/2020 Imaging   1. Status post hysterectomy. No evidence of recurrent or new metastatic disease within the chest, abdomen, or pelvis. 2. Scattered bilateral tiny pulmonary nodules, unchanged from prior studies and most likely sequela of prior infection or inflammation, recommend attention on follow-up imaging. No new suspicious pulmonary nodules or masses. 3. Subcutaneous edema and small hematoma overlying the posterior gluteal musculature, recommend correlation with recent history of trauma. 4. Mild low-density wall thickening of the gastric antrum, which may represent gastritis. 5. Hepatic steatosis. 6. Extensive sigmoid colonic diverticulosis without findings of acute diverticulitis. 7. Aortic atherosclerosis.  Aortic Atherosclerosis (ICD10-I70.0).   11/20/2020 Echocardiogram    1. Compared to echo from Jan 2022, Global longitudinal strain is less negative. (Previously -27%). Left ventricular ejection fraction, by estimation, is 60 to 65%. The left ventricle has normal function. The left ventricle has no regional wall motion abnormalities. There is mild left ventricular hypertrophy. Left ventricular diastolic parameters are indeterminate.  2. Right ventricular systolic function is normal. The right ventricular size is normal.  3. The mitral  valve is normal in structure. Trivial mitral valve regurgitation.  4. Aortic valve regurgitation is mild. Mild aortic valve sclerosis is present, with no evidence of aortic valve  stenosis.     03/12/2021 Echocardiogram   1. Left ventricular ejection fraction, by estimation, is 60 to 65%. Left ventricular ejection fraction by 2D MOD biplane is 63.6 %. The left ventricle has normal function. The left ventricle has no regional wall motion abnormalities. There is mild left ventricular hypertrophy. Left ventricular diastolic parameters are consistent with Grade I diastolic dysfunction (impaired relaxation). The average left ventricular global longitudinal strain is -20.0 %. The global longitudinal strain is normal.  2. Right ventricular systolic function is normal. The right ventricular size is normal. There is normal pulmonary artery systolic pressure. The estimated right ventricular systolic pressure is 20.0 mmHg.  3. The mitral valve is normal in structure. No evidence of mitral valve regurgitation. No evidence of mitral stenosis.  4. The aortic valve is tricuspid. Aortic valve regurgitation is trivial. No aortic stenosis is present. Aortic regurgitation PHT measures 403 msec.  5. The inferior vena cava is normal in size with greater than 50% respiratory variability, suggesting right atrial pressure of 3 mmHg.   04/02/2021 Imaging   Stable small pulmonary nodules scattered throughout the chest, unchanged, without new or suspicious pulmonary nodule.   Three-vessel coronary artery disease.   Aortic Atherosclerosis (ICD10-I70.0).   06/06/2021 Imaging    1. Left ventricular ejection fraction, by estimation, is 60 to 65%. The left ventricle has normal function. The left ventricle has no regional wall motion abnormalities. Left ventricular diastolic parameters are consistent with Grade I diastolic dysfunction (impaired relaxation).  2. Right ventricular systolic function is normal. The right ventricular size is normal. There is normal pulmonary artery systolic pressure. The estimated right ventricular systolic pressure is 28.6 mmHg.  3. Left atrial size was mildly dilated.  4. The  mitral valve is normal in structure. Trivial mitral valve regurgitation.  5. The aortic valve is tricuspid. Aortic valve regurgitation is mild. No aortic stenosis is present.  6. The inferior vena cava is normal in size with greater than 50% respiratory variability, suggesting right atrial pressure of 3 mmHg.  7. GLS attempted but not reported due to suboptimal tracking.     09/25/2021 Echocardiogram    1. Left ventricular ejection fraction, by estimation, is 60 to 65%. Left ventricular ejection fraction by 3D volume is 56 %. The left ventricle has normal function. The left ventricle has no regional wall motion abnormalities. Left ventricular diastolic parameters were normal.  2. Right ventricular systolic function is normal. The right ventricular size is normal. There is normal pulmonary artery systolic pressure.  3. The mitral valve is normal in structure. Trivial mitral valve regurgitation. No evidence of mitral stenosis.  4. The aortic valve is tricuspid. Aortic valve regurgitation is mild. No aortic stenosis is present.  5. The inferior vena cava is normal in size with greater than 50% respiratory variability, suggesting right atrial pressure of 3 mmHg.   10/15/2021 Imaging   1. Stable examination post prior hysterectomy without evidence of local recurrence or metastatic disease within the chest, abdomen, or pelvis. 2. Unchanged small bilateral pulmonary nodules, likely benign. 3. Left-sided colonic diverticulosis without findings of acute diverticulitis. 4. Aortic Atherosclerosis (ICD10-I70.0).       12/31/2021 Echocardiogram    1. Left ventricular ejection fraction, by estimation, is 65 to 70%. The left ventricle has normal function. The left ventricle has no regional wall motion  abnormalities. Left ventricular diastolic parameters are consistent with Grade I diastolic dysfunction (impaired relaxation).  2. Right ventricular systolic function is normal. The right ventricular size is normal.  There is normal pulmonary artery systolic pressure. The estimated right ventricular systolic pressure is 27.4 mmHg.  3. The mitral valve is grossly normal. Trivial mitral valve regurgitation. No evidence of mitral stenosis.  4. The aortic valve is tricuspid. Aortic valve regurgitation is mild. No aortic stenosis is present.  5. The inferior vena cava is normal in size with greater than 50% respiratory variability, suggesting right atrial pressure of 3 mmHg.     04/02/2022 -  Chemotherapy   Patient is on Treatment Plan : BREAST MAINTENANCE Trastuzumab  IV (6) or SQ (600) D1 q21d X 11 Cycles     04/04/2022 Echocardiogram    1. Left ventricular ejection fraction, by estimation, is 65 to 70%. The left ventricle has normal function. The left ventricle has no regional wall motion abnormalities. Left ventricular diastolic parameters are consistent with Grade I diastolic dysfunction (impaired relaxation).  2. Right ventricular systolic function is normal. The right ventricular size is normal.  3. Left atrial size was mildly dilated.  4. The mitral valve is normal in structure. Trivial mitral valve regurgitation. No evidence of mitral stenosis.  5. The aortic valve is tricuspid. There is mild calcification of the aortic valve. Aortic valve regurgitation is mild. No aortic stenosis is present. Aortic regurgitation PHT measures 613 msec. Aortic valve area, by VTI measures 1.70 cm. Aortic valve mean gradient measures 3.0 mmHg. Aortic valve Vmax measures 1.15 m/s.  6. The inferior vena cava is normal in size with greater than 50% respiratory variability, suggesting right atrial pressure of 3 mmHg.   07/02/2022 Echocardiogram   1. Left ventricular ejection fraction, by estimation, is 60 to 65%. Left ventricular ejection fraction by 3D volume is 58 %. The left ventricle has normal function. The left ventricle has no regional wall motion abnormalities. Left ventricular diastolic parameters are consistent with Grade  I diastolic dysfunction (impaired relaxation). The average left ventricular global longitudinal strain is -20.7 %. The global longitudinal strain is normal.  2. Right ventricular systolic function is normal. The right ventricular size is normal. Tricuspid regurgitation signal is inadequate for assessing PA pressure.  3. Left atrial size was mildly dilated.  4. The mitral valve is normal in structure. Trivial mitral valve regurgitation. No evidence of mitral stenosis.  5. The aortic valve is grossly normal. Aortic valve regurgitation is mild. No aortic stenosis is present.  6. The inferior vena cava is normal in size with greater than 50% respiratory variability, suggesting right atrial pressure of 3 mmHg.   11/05/2022 Echocardiogram       1. Left ventricular ejection fraction, by estimation, is 55 to 60%. Left ventricular ejection fraction by 3D volume is 59 %. The left ventricle has normal function. The left ventricle has no regional wall motion abnormalities. Left ventricular diastolic  parameters are consistent with Grade I diastolic dysfunction (impaired relaxation). The average left ventricular global longitudinal strain is -16.8 %. The global longitudinal strain is normal.  2. Right ventricular systolic function is normal. The right ventricular size is normal. There is normal pulmonary artery systolic pressure.  3. The mitral valve is normal in structure. No evidence of mitral valve regurgitation. No evidence of mitral stenosis.  4. The aortic valve is normal in structure. Aortic valve regurgitation is mild. No aortic stenosis is present.  5. The inferior vena cava is normal  in size with greater than 50% respiratory variability, suggesting right atrial pressure of 3 mmHg.     11/07/2022 Imaging   CT CHEST ABDOMEN PELVIS W CONTRAST  Result Date: 11/07/2022 CLINICAL DATA:  Cervical cancer. Assess treatment response. On chemotherapy * Tracking Code: BO * EXAM: CT CHEST, ABDOMEN, AND PELVIS WITH  CONTRAST TECHNIQUE: Multidetector CT imaging of the chest, abdomen and pelvis was performed following the standard protocol during bolus administration of intravenous contrast. RADIATION DOSE REDUCTION: This exam was performed according to the departmental dose-optimization program which includes automated exposure control, adjustment of the mA and/or kV according to patient size and/or use of iterative reconstruction technique. CONTRAST:  OMNIPAQUE  IOHEXOL  300 MG/ML  SOLN COMPARISON:  CT 10/15/2021 and older FINDINGS: CT CHEST FINDINGS Cardiovascular: Right upper chest port. Heart is nonenlarged. No pericardial effusion. The thoracic aorta has a normal course and caliber with scattered vascular calcifications. There is a bovine type aortic arch, a normal variant. Mediastinum/Nodes: Heterogeneous small thyroid  gland with a nodular area in the right is unchanged from previous. As stated previously, no specific imaging follow-up. No specific abnormal lymph node enlargement seen in the axillary regions, hilum or mediastinum. Surgical clips in the left axillary region. Breast implants are noted. Normal caliber thoracic esophagus. Lungs/Pleura: There is some linear opacity lung bases likely scar or atelectasis. No pleural effusion. No consolidation or pneumothorax. There are a few scattered tiny lung nodules identified. Example 3 mm superior segment left lower lobe on series 6, image 49 which is stable. Lingular nodule measuring 4 mm on the prior, today is stable on series 6, image 73. 3 mm nodule along the margin of the minor fissure on the right is also stable. There is a larger ill-defined areas seen right lung on the prior examination series 4, image 63 which has resolved. This may have been infectious or inflammatory. There is a small area of bronchiectasis in this location today on series 6, image 58. No new dominant lung nodule. Musculoskeletal: Diffuse moderate degenerative changes of the spine. CT ABDOMEN  PELVIS FINDINGS Hepatobiliary: Fatty liver infiltration. There are some tiny low-attenuation lesion seen in the liver which are too small to completely characterize but unchanged from previous. Simple attention on follow-up for the patient's neoplasm. Example series 2, image 44 anterior to branches of the right hepatic vein. Gallbladder is nondilated. Patent portal vein. Pancreas: Unremarkable. No pancreatic ductal dilatation or surrounding inflammatory changes. Spleen: Normal in size without focal abnormality. Adrenals/Urinary Tract: The adrenal glands are preserved. No enhancing renal mass. There are some tiny low-attenuation lesion seen along each kidney which are too small to completely characterize likely small cysts. No specific imaging follow-up. Bosniak 2 lesions. Contracted urinary bladder. Stomach/Bowel: Large bowel has extensive left-sided colonic stool. Extensive colonic diverticulosis as well. The large bowel is nondilated. Small bowel is nondilated. There is a nodular area along the upper aspect of the stomach fold thickening. On series 2, image 45 this measures 18 by 14 mm. This is not well seen previously but on the prior study the stomach is more collapsed. There may have been something in retrospect in this location. There also is some nodular areas along the antrum. Recommend further evaluation to exclude underlying lesion. Vascular/Lymphatic: Normal caliber aorta and IVC with scattered vascular calcifications. No specific abnormal lymph node enlargement identified in the abdomen and pelvis. Reproductive: Status post hysterectomy. No adnexal masses. Other: No free air or free fluid. Musculoskeletal: Moderate degenerative changes of the spine and pelvis. Trace  anterolisthesis at L4-5 and L5-S1. 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 IMPRESSION: Stable tiny multiple pulmonary nodules. 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 No developing lymph node enlargement or fluid  collection. Colonic diverticulosis of the sigmoid colon with moderate stool. There are nodular masslike areas seen in the stomach including towards the fundus and small area towards the antrum with fold thickening. Hyperplastic polyps are possible. Recommend further workup of the stomach when appropriate to exclude an aggressive process. Electronically Signed   By: Ranell Bring M.D.   On: 11/07/2022 13:39   ECHOCARDIOGRAM COMPLETE  Result Date: 11/05/2022    ECHOCARDIOGRAM REPORT   Patient Name:   Vicki Cervantes Date of Exam: 11/05/2022 Medical Rec #:  [id number]      Height:       62.0 in Accession #:    [id number]     Weight:       134.8 lb Date of Birth:  [birth year]      BSA:          1.617 m Patient Age:    83 years       BP:           144/72 mmHg Patient Gender: F              HR:           69 bpm. Exam Location:  Outpatient Procedure: 2D Echo, 3D Echo, Cardiac Doppler, Color Doppler and Strain Analysis Indications:    chemo  History:        Patient has prior history of Echocardiogram examinations. Risk                 Factors:Hypertension.  Sonographer:    Morna Luis Referring Phys: [id number] Kooper Chriswell IMPRESSIONS  1. Left ventricular ejection fraction, by estimation, is 55 to 60%. Left ventricular ejection fraction by 3D volume is 59 %. The left ventricle has normal function. The left ventricle has no regional wall motion abnormalities. Left ventricular diastolic  parameters are consistent with Grade I diastolic dysfunction (impaired relaxation). The average left ventricular global longitudinal strain is -16.8 %. The global longitudinal strain is normal.  2. Right ventricular systolic function is normal. The right ventricular size is normal. There is normal pulmonary artery systolic pressure.  3. The mitral valve is normal in structure. No evidence of mitral valve regurgitation. No evidence of mitral stenosis.  4. The aortic valve is normal in structure. Aortic valve regurgitation is mild. No aortic  stenosis is present.  5. The inferior vena cava is normal in size with greater than 50% respiratory variability, suggesting right atrial pressure of 3 mmHg. Comparison(s): Prior 07/01/2022: EF 60%, GLS -20.7% Prior 03/2022: GLS-15.3. FINDINGS  Left Ventricle: Left ventricular ejection fraction, by estimation, is 55 to 60%. Left ventricular ejection fraction by 3D volume is 59 %. The left ventricle has normal function. The left ventricle has no regional wall motion abnormalities. The average left ventricular global longitudinal strain is -16.8 %. The global longitudinal strain is normal. The left ventricular internal cavity size was normal in size. There is no left ventricular hypertrophy. Left ventricular diastolic parameters are consistent  with Grade I diastolic dysfunction (impaired relaxation). Right Ventricle: The right ventricular size is normal. No increase in right ventricular wall thickness. Right ventricular systolic function is normal. There is normal pulmonary artery systolic pressure. The tricuspid regurgitant velocity is 1.79 m/s, and  with an assumed right atrial pressure of 3 mmHg, the estimated  right ventricular systolic pressure is 15.8 mmHg. Left Atrium: Left atrial size was normal in size. Right Atrium: Right atrial size was normal in size. Pericardium: There is no evidence of pericardial effusion. Mitral Valve: The mitral valve is normal in structure. No evidence of mitral valve regurgitation. No evidence of mitral valve stenosis. Tricuspid Valve: The tricuspid valve is normal in structure. Tricuspid valve regurgitation is not demonstrated. No evidence of tricuspid stenosis. Aortic Valve: The aortic valve is normal in structure. Aortic valve regurgitation is mild. Aortic regurgitation PHT measures 497 msec. No aortic stenosis is present. Pulmonic Valve: The pulmonic valve was normal in structure. Pulmonic valve regurgitation is mild. No evidence of pulmonic stenosis. Aorta: The aortic root is  normal in size and structure. Venous: The inferior vena cava is normal in size with greater than 50% respiratory variability, suggesting right atrial pressure of 3 mmHg. IAS/Shunts: No atrial level shunt detected by color flow Doppler.  LEFT VENTRICLE PLAX 2D LVIDd:         3.80 cm         Diastology LVIDs:         2.50 cm         LV e' medial:    7.51 cm/s LV PW:         1.00 cm         LV E/e' medial:  7.0 LV IVS:        1.00 cm         LV e' lateral:   8.16 cm/s LVOT diam:     1.90 cm         LV E/e' lateral: 6.4 LV SV:         49 LV SV Index:   30              2D LVOT Area:     2.84 cm        Longitudinal                                Strain                                2D Strain GLS  -16.8 % LV Volumes (MOD)               Avg: LV vol d, MOD    62.3 ml A2C:                           3D Volume EF LV vol d, MOD    68.5 ml       LV 3D EF:    Left A4C:                                        ventricul LV vol s, MOD    25.6 ml                    ar A2C:                                        ejection LV vol s, MOD    24.1 ml  fraction A4C:                                        by 3D LV SV MOD A2C:   36.7 ml                    volume is LV SV MOD A4C:   68.5 ml                    59 %. LV SV MOD BP:    39.9 ml                                 3D Volume EF:                                3D EF:        59 %                                LV EDV:       116 ml                                LV ESV:       48 ml                                LV SV:        68 ml RIGHT VENTRICLE             IVC RV Basal diam:  3.00 cm     IVC diam: 1.40 cm RV S prime:     10.20 cm/s TAPSE (M-mode): 1.9 cm LEFT ATRIUM             Index        RIGHT ATRIUM           Index LA diam:        3.40 cm 2.10 cm/m   RA Area:     11.20 cm LA Vol (A2C):   49.4 ml 30.56 ml/m  RA Volume:   22.50 ml  13.92 ml/m LA Vol (A4C):   28.3 ml 17.51 ml/m LA Biplane Vol: 38.6 ml 23.88 ml/m  AORTIC VALVE LVOT Vmax:   79.80 cm/s LVOT Vmean:   49.400 cm/s LVOT VTI:    0.173 m AI PHT:      497 msec  AORTA Ao Root diam: 3.20 cm MITRAL VALVE               TRICUSPID VALVE MV Area (PHT): 2.84 cm    TR Peak grad:   12.8 mmHg MV Decel Time: 267 msec    TR Vmax:        179.00 cm/s MV E velocity: 52.40 cm/s MV A velocity: 69.80 cm/s  SHUNTS MV E/A ratio:  0.75        Systemic VTI:  0.17 m                            Systemic Diam: 1.90 cm Oneil Parchment MD Electronically  signed by Oneil Parchment MD Signature Date/Time: 11/05/2022/2:11:35 PM    Final       02/17/2023 Echocardiogram   ECHOCARDIOGRAM COMPLETE  Result Date: 02/17/2023    ECHOCARDIOGRAM REPORT   Patient Name:   Vicki Cervantes Date of Exam: 02/17/2023 Medical Rec #:  [id number]      Height:       62.0 in Accession #:    [id number]     Weight:       137.4 lb Date of Birth:  May 29, 1941      BSA:          1.630 m Patient Age:    82 years       BP:           143/77 mmHg Patient Gender: F              HR:           71 bpm. Exam Location:  Outpatient Procedure: 2D Echo, Strain Analysis, Color Doppler and Cardiac Doppler Indications:    Chemo  History:        Patient has prior history of Echocardiogram examinations, most                 recent 11/05/2022. Risk Factors:Hypertension.  Referring Phys: [id number] Dorette Hartel IMPRESSIONS  1. Left ventricular ejection fraction, by estimation, is 60 to 65%. The left ventricle has normal function. The left ventricle has no regional wall motion abnormalities. Left ventricular diastolic parameters are consistent with Grade I diastolic dysfunction (impaired relaxation).  2. Right ventricular systolic function is normal. The right ventricular size is normal.  3. The mitral valve is normal in structure. Trivial mitral valve regurgitation.  4. The aortic valve was not well visualized. Aortic valve regurgitation is not visualized.  5. The inferior vena cava is normal in size with greater than 50% respiratory variability, suggesting right atrial pressure of 3 mmHg. FINDINGS  Left  Ventricle: Left ventricular ejection fraction, by estimation, is 60 to 65%. The left ventricle has normal function. The left ventricle has no regional wall motion abnormalities. The left ventricular internal cavity size was normal in size. There is  no left ventricular hypertrophy. Left ventricular diastolic parameters are consistent with Grade I diastolic dysfunction (impaired relaxation). Right Ventricle: The right ventricular size is normal. Right ventricular systolic function is normal. Left Atrium: Left atrial size was normal in size. Right Atrium: Right atrial size was normal in size. Pericardium: There is no evidence of pericardial effusion. Mitral Valve: The mitral valve is normal in structure. Trivial mitral valve regurgitation. Tricuspid Valve: The tricuspid valve is normal in structure. Tricuspid valve regurgitation is not demonstrated. Aortic Valve: The aortic valve was not well visualized. Aortic valve regurgitation is not visualized. Pulmonic Valve: Pulmonic valve regurgitation is not visualized. Aorta: The aortic root and ascending aorta are structurally normal, with no evidence of dilitation. Venous: The inferior vena cava is normal in size with greater than 50% respiratory variability, suggesting right atrial pressure of 3 mmHg. IAS/Shunts: No atrial level shunt detected by color flow Doppler.  LEFT VENTRICLE PLAX 2D LVIDd:         4.50 cm   Diastology LVIDs:         3.10 cm   LV e' medial:    5.33 cm/s LV PW:         0.90 cm   LV E/e' medial:  13.3 LV IVS:        0.90 cm  LV e' lateral:   7.07 cm/s LVOT diam:     2.00 cm   LV E/e' lateral: 10.1 LV SV:         44 LV SV Index:   27 LVOT Area:     3.14 cm  RIGHT VENTRICLE            IVC RV S prime:     8.59 cm/s  IVC diam: 0.70 cm TAPSE (M-mode): 1.7 cm LEFT ATRIUM             Index        RIGHT ATRIUM           Index LA Vol (A2C):   43.9 ml 26.94 ml/m  RA Area:     11.00 cm LA Vol (A4C):   35.5 ml 21.78 ml/m  RA Volume:   21.50 ml  13.19 ml/m  LA Biplane Vol: 41.6 ml 25.53 ml/m  AORTIC VALVE LVOT Vmax:   59.60 cm/s LVOT Vmean:  41.100 cm/s LVOT VTI:    0.139 m MITRAL VALVE MV Area (PHT): 4.57 cm    SHUNTS MV Decel Time: 166 msec    Systemic VTI:  0.14 m MV E velocity: 71.10 cm/s  Systemic Diam: 2.00 cm MV A velocity: 78.40 cm/s MV E/A ratio:  0.91 Ronal Ross Electronically signed by Ronal Ross Signature Date/Time: 02/17/2023/12:11:53 PM    Final       06/27/2023 Echocardiogram       1. Left ventricular ejection fraction, by estimation, is 60 to 65%. The left ventricle has normal function. The left ventricle has no regional wall motion abnormalities. Left ventricular diastolic parameters were normal. The average left ventricular  global longitudinal strain is -16.5 %. The global longitudinal strain is normal.  2. Right ventricular systolic function is normal. The right ventricular size is normal.  3. The mitral valve is abnormal. No evidence of mitral valve regurgitation. No evidence of mitral stenosis.  4. The aortic valve is tricuspid. There is moderate calcification of the aortic valve. There is moderate thickening of the aortic valve. Aortic valve regurgitation is trivial. Aortic valve sclerosis is present, with no evidence of aortic valve stenosis.  5. The inferior vena cava is normal in size with greater than 50% respiratory variability, suggesting right atrial pressure of 3 mmHg.   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/14/2018 Initial Diagnosis   Metastasis to lymph nodes (HCC)   10/21/2018 - 02/24/2019 Chemotherapy   The patient had palonosetron  (ALOXI ) injection 0.25 mg, 0.25 mg, Intravenous,  Once, 7 of 7 cycles Administration: 0.25 mg (10/21/2018), 0.25 mg (11/11/2018), 0.25 mg (12/02/2018), 0.25 mg (12/23/2018), 0.25 mg (01/13/2019), 0.25 mg (02/03/2019), 0.25 mg (02/24/2019) CARBOplatin  (PARAPLATIN ) 310 mg in sodium chloride  0.9 % 250 mL chemo infusion, 310 mg, Intravenous,  Once, 7 of 7 cycles Dose modification: 300 mg (original dose  311.5 mg, Cycle 4, Reason: Dose not tolerated) Administration: 310 mg (10/21/2018), 300 mg (11/11/2018), 300 mg (12/02/2018), 300 mg (12/23/2018), 300 mg (01/13/2019), 300 mg (02/03/2019), 300 mg (02/24/2019) PACLitaxel  (TAXOL ) 222 mg in sodium chloride  0.9 % 250 mL chemo infusion (> 80mg /m2), 135 mg/m2 = 222 mg, Intravenous,  Once, 7 of 7 cycles Administration: 222 mg (10/21/2018), 222 mg (11/11/2018), 222 mg (12/02/2018), 222 mg (12/23/2018), 222 mg (01/13/2019), 222 mg (02/03/2019), 222 mg (02/24/2019) fosaprepitant  (EMEND ) 150 mg, dexamethasone  (DECADRON ) 12 mg in sodium chloride  0.9 % 145 mL IVPB, , Intravenous,  Once, 7 of 7 cycles Administration:  (10/21/2018),  (11/11/2018),  (  12/02/2018),  (12/23/2018),  (01/13/2019),  (02/03/2019),  (02/24/2019)  for chemotherapy treatment.    04/02/2022 -  Chemotherapy   Patient is on Treatment Plan : BREAST MAINTENANCE Trastuzumab  IV (6) or SQ (600) D1 q21d X 11 Cycles       PHYSICAL EXAMINATION: ECOG PERFORMANCE STATUS: 0 - Asymptomatic  Vitals:   07/22/23 1122  BP: (!) 182/62  Pulse: 65  Resp: 18  Temp: 98.3 F (36.8 C)  SpO2: 97%   Filed Weights   07/22/23 1122  Weight: 134 lb 12.8 oz (61.1 kg)    GENERAL:alert, no distress and comfortable  LABORATORY DATA:  I have reviewed the data as listed    Component Value Date/Time   NA 138 07/22/2023 1115   NA 139 02/13/2017 1512   K 3.7 07/22/2023 1115   K 4.2 02/13/2017 1512   CL 103 07/22/2023 1115   CO2 27 07/22/2023 1115   CO2 28 02/13/2017 1512   GLUCOSE 99 07/22/2023 1115   GLUCOSE 118 02/13/2017 1512   BUN 25 (H) 07/22/2023 1115   BUN 25.2 02/13/2017 1512   CREATININE 1.25 (H) 07/22/2023 1115   CREATININE 1.20 (H) 11/05/2022 1215   CREATININE 1.2 (H) 02/13/2017 1512   CALCIUM  9.3 07/22/2023 1115   CALCIUM  10.0 02/13/2017 1512   PROT 6.8 07/22/2023 1115   ALBUMIN 4.1 07/22/2023 1115   AST 18 07/22/2023 1115   AST 19 11/05/2022 1215   ALT 11 07/22/2023 1115   ALT 12 11/05/2022 1215   ALKPHOS  69 07/22/2023 1115   BILITOT 0.4 07/22/2023 1115   BILITOT 0.4 11/05/2022 1215   GFRNONAA 43 (L) 07/22/2023 1115   GFRNONAA 45 (L) 11/05/2022 1215   GFRAA 47 (L) 03/28/2020 0933   GFRAA 42 (L) 12/21/2019 0850    No results found for: SPEP, UPEP  Lab Results  Component Value Date   WBC 6.3 07/22/2023   NEUTROABS 4.1 07/22/2023   HGB 10.7 (L) 07/22/2023   HCT 30.7 (L) 07/22/2023   MCV 88.5 07/22/2023   PLT 240 07/22/2023      Chemistry      Component Value Date/Time   NA 138 07/22/2023 1115   NA 139 02/13/2017 1512   K 3.7 07/22/2023 1115   K 4.2 02/13/2017 1512   CL 103 07/22/2023 1115   CO2 27 07/22/2023 1115   CO2 28 02/13/2017 1512   BUN 25 (H) 07/22/2023 1115   BUN 25.2 02/13/2017 1512   CREATININE 1.25 (H) 07/22/2023 1115   CREATININE 1.20 (H) 11/05/2022 1215   CREATININE 1.2 (H) 02/13/2017 1512      Component Value Date/Time   CALCIUM  9.3 07/22/2023 1115   CALCIUM  10.0 02/13/2017 1512   ALKPHOS 69 07/22/2023 1115   AST 18 07/22/2023 1115   AST 19 11/05/2022 1215   ALT 11 07/22/2023 1115   ALT 12 11/05/2022 1215   BILITOT 0.4 07/22/2023 1115   BILITOT 0.4 11/05/2022 1215       RADIOGRAPHIC STUDIES: I have personally reviewed the radiological images as listed and agreed with the findings in the report. ECHOCARDIOGRAM COMPLETE Result Date: 06/27/2023    ECHOCARDIOGRAM REPORT   Patient Name:   Vicki Cervantes Date of Exam: 06/27/2023 Medical Rec #:  [id number]      Height:       62.0 in Accession #:    [id number]     Weight:       135.2 lb Date of Birth:  01/16/1941  BSA:          1.619 m Patient Age:    82 years       BP:           154/74 mmHg Patient Gender: F              HR:           58 bpm. Exam Location:  Outpatient Procedure: 2D Echo, 3D Echo, Color Doppler, Cardiac Doppler and Strain Analysis Indications:    Chemo  History:        Patient has prior history of Echocardiogram examinations, most                 recent 02/17/2023. Risk  Factors:Hypertension and Dyslipidemia.  Sonographer:    Damien Senior RDCS Referring Phys: [phone] Lakiyah Arntson IMPRESSIONS  1. Left ventricular ejection fraction, by estimation, is 60 to 65%. The left ventricle has normal function. The left ventricle has no regional wall motion abnormalities. Left ventricular diastolic parameters were normal. The average left ventricular global longitudinal strain is -16.5 %. The global longitudinal strain is normal.  2. Right ventricular systolic function is normal. The right ventricular size is normal.  3. The mitral valve is abnormal. No evidence of mitral valve regurgitation. No evidence of mitral stenosis.  4. The aortic valve is tricuspid. There is moderate calcification of the aortic valve. There is moderate thickening of the aortic valve. Aortic valve regurgitation is trivial. Aortic valve sclerosis is present, with no evidence of aortic valve stenosis.  5. The inferior vena cava is normal in size with greater than 50% respiratory variability, suggesting right atrial pressure of 3 mmHg. FINDINGS  Left Ventricle: Left ventricular ejection fraction, by estimation, is 60 to 65%. The left ventricle has normal function. The left ventricle has no regional wall motion abnormalities. The average left ventricular global longitudinal strain is -16.5 %. The global longitudinal strain is normal. The left ventricular internal cavity size was normal in size. There is no left ventricular hypertrophy. Left ventricular diastolic parameters were normal. Right Ventricle: The right ventricular size is normal. No increase in right ventricular wall thickness. Right ventricular systolic function is normal. Left Atrium: Left atrial size was normal in size. Right Atrium: Right atrial size was normal in size. Pericardium: There is no evidence of pericardial effusion. Mitral Valve: The mitral valve is abnormal. There is mild thickening of the mitral valve leaflet(s). There is mild calcification of the  mitral valve leaflet(s). Mild mitral annular calcification. No evidence of mitral valve regurgitation. No evidence of mitral valve stenosis. Tricuspid Valve: The tricuspid valve is normal in structure. Tricuspid valve regurgitation is not demonstrated. No evidence of tricuspid stenosis. Aortic Valve: The aortic valve is tricuspid. There is moderate calcification of the aortic valve. There is moderate thickening of the aortic valve. Aortic valve regurgitation is trivial. Aortic valve sclerosis is present, with no evidence of aortic valve  stenosis. Pulmonic Valve: The pulmonic valve was normal in structure. Pulmonic valve regurgitation is not visualized. No evidence of pulmonic stenosis. Aorta: The aortic root is normal in size and structure. Venous: The inferior vena cava is normal in size with greater than 50% respiratory variability, suggesting right atrial pressure of 3 mmHg. IAS/Shunts: No atrial level shunt detected by color flow Doppler.  LEFT VENTRICLE PLAX 2D LVIDd:         3.30 cm   Diastology LVIDs:         2.10 cm   LV e' medial:  5.98 cm/s LV PW:         0.90 cm   LV E/e' medial:  10.5 LV IVS:        0.90 cm   LV e' lateral:   7.18 cm/s LVOT diam:     1.90 cm   LV E/e' lateral: 8.8 LV SV:         41 LV SV Index:   25        2D Longitudinal Strain LVOT Area:     2.84 cm  2D Strain GLS Avg:     -16.5 %                           3D Volume EF:                          3D EF:        58 %                          LV EDV:       131 ml                          LV ESV:       55 ml                          LV SV:        75 ml RIGHT VENTRICLE RV S prime:     11.20 cm/s TAPSE (M-mode): 1.8 cm LEFT ATRIUM             Index        RIGHT ATRIUM           Index LA diam:        2.60 cm 1.61 cm/m   RA Area:     16.10 cm LA Vol (A2C):   30.3 ml 18.72 ml/m  RA Volume:   41.90 ml  25.89 ml/m LA Vol (A4C):   73.5 ml 45.41 ml/m LA Biplane Vol: 47.5 ml 29.35 ml/m  AORTIC VALVE LVOT Vmax:   64.60 cm/s LVOT Vmean:   48.200 cm/s LVOT VTI:    0.145 m  AORTA Ao Root diam: 2.90 cm MITRAL VALVE MV Area (PHT): 3.23 cm    SHUNTS MV Decel Time: 235 msec    Systemic VTI:  0.14 m MV E velocity: 63.00 cm/s  Systemic Diam: 1.90 cm MV A velocity: 84.80 cm/s MV E/A ratio:  0.74 Maude Emmer MD Electronically signed by Maude Emmer MD Signature Date/Time: 06/27/2023/12:07:24 PM    Final

## 2023-07-22 NOTE — Assessment & Plan Note (Signed)
 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

## 2023-07-23 ENCOUNTER — Other Ambulatory Visit: Payer: Self-pay

## 2023-08-13 ENCOUNTER — Inpatient Hospital Stay: Payer: Medicare HMO

## 2023-08-13 VITALS — BP 143/74 | HR 68 | Temp 98.0°F | Resp 17 | Wt 131.8 lb

## 2023-08-13 DIAGNOSIS — N183 Chronic kidney disease, stage 3 unspecified: Secondary | ICD-10-CM | POA: Diagnosis not present

## 2023-08-13 DIAGNOSIS — Z7189 Other specified counseling: Secondary | ICD-10-CM

## 2023-08-13 DIAGNOSIS — C541 Malignant neoplasm of endometrium: Secondary | ICD-10-CM

## 2023-08-13 DIAGNOSIS — Z90710 Acquired absence of both cervix and uterus: Secondary | ICD-10-CM | POA: Diagnosis not present

## 2023-08-13 DIAGNOSIS — C773 Secondary and unspecified malignant neoplasm of axilla and upper limb lymph nodes: Secondary | ICD-10-CM | POA: Diagnosis not present

## 2023-08-13 DIAGNOSIS — Z5112 Encounter for antineoplastic immunotherapy: Secondary | ICD-10-CM | POA: Diagnosis not present

## 2023-08-13 DIAGNOSIS — Z9221 Personal history of antineoplastic chemotherapy: Secondary | ICD-10-CM | POA: Diagnosis not present

## 2023-08-13 DIAGNOSIS — Z79899 Other long term (current) drug therapy: Secondary | ICD-10-CM | POA: Diagnosis not present

## 2023-08-13 DIAGNOSIS — Z7982 Long term (current) use of aspirin: Secondary | ICD-10-CM | POA: Diagnosis not present

## 2023-08-13 LAB — CBC WITH DIFFERENTIAL/PLATELET
Abs Immature Granulocytes: 0.02 10*3/uL (ref 0.00–0.07)
Basophils Absolute: 0.1 10*3/uL (ref 0.0–0.1)
Basophils Relative: 2 %
Eosinophils Absolute: 0.3 10*3/uL (ref 0.0–0.5)
Eosinophils Relative: 5 %
HCT: 31.9 % — ABNORMAL LOW (ref 36.0–46.0)
Hemoglobin: 11.3 g/dL — ABNORMAL LOW (ref 12.0–15.0)
Immature Granulocytes: 0 %
Lymphocytes Relative: 25 %
Lymphs Abs: 1.6 10*3/uL (ref 0.7–4.0)
MCH: 31.4 pg (ref 26.0–34.0)
MCHC: 35.4 g/dL (ref 30.0–36.0)
MCV: 88.6 fL (ref 80.0–100.0)
Monocytes Absolute: 0.6 10*3/uL (ref 0.1–1.0)
Monocytes Relative: 10 %
Neutro Abs: 3.6 10*3/uL (ref 1.7–7.7)
Neutrophils Relative %: 58 %
Platelets: 239 10*3/uL (ref 150–400)
RBC: 3.6 MIL/uL — ABNORMAL LOW (ref 3.87–5.11)
RDW: 11.8 % (ref 11.5–15.5)
WBC: 6.2 10*3/uL (ref 4.0–10.5)
nRBC: 0 % (ref 0.0–0.2)

## 2023-08-13 LAB — COMPREHENSIVE METABOLIC PANEL
ALT: 12 U/L (ref 0–44)
AST: 18 U/L (ref 15–41)
Albumin: 4.2 g/dL (ref 3.5–5.0)
Alkaline Phosphatase: 71 U/L (ref 38–126)
Anion gap: 10 (ref 5–15)
BUN: 25 mg/dL — ABNORMAL HIGH (ref 8–23)
CO2: 27 mmol/L (ref 22–32)
Calcium: 9.3 mg/dL (ref 8.9–10.3)
Chloride: 101 mmol/L (ref 98–111)
Creatinine, Ser: 1.17 mg/dL — ABNORMAL HIGH (ref 0.44–1.00)
GFR, Estimated: 46 mL/min — ABNORMAL LOW (ref >60)
Glucose, Bld: 111 mg/dL — ABNORMAL HIGH (ref 70–99)
Potassium: 3.6 mmol/L (ref 3.5–5.1)
Sodium: 138 mmol/L (ref 135–145)
Total Bilirubin: 0.4 mg/dL (ref 0.0–1.2)
Total Protein: 6.9 g/dL (ref 6.5–8.1)

## 2023-08-13 MED ORDER — SODIUM CHLORIDE 0.9 % IV SOLN: Freq: Once | INTRAVENOUS | Status: AC

## 2023-08-13 MED ORDER — SODIUM CHLORIDE 0.9% FLUSH
10.0000 mL | Freq: Once | INTRAVENOUS | Status: AC
  Administered 2023-08-13: 10 mL

## 2023-08-13 MED ORDER — TRASTUZUMAB-ANNS CHEMO 150 MG IV SOLR
6.0000 mg/kg | Freq: Once | INTRAVENOUS | Status: AC
  Administered 2023-08-13: 357 mg via INTRAVENOUS
  Filled 2023-08-13: qty 17

## 2023-08-24 ENCOUNTER — Other Ambulatory Visit: Payer: Self-pay

## 2023-08-28 ENCOUNTER — Other Ambulatory Visit: Payer: Self-pay

## 2023-08-29 ENCOUNTER — Inpatient Hospital Stay: Payer: Medicare HMO | Attending: Gynecologic Oncology | Admitting: Gynecologic Oncology

## 2023-08-29 ENCOUNTER — Encounter: Payer: Self-pay | Admitting: Gynecologic Oncology

## 2023-08-29 VITALS — BP 154/72 | HR 78 | Temp 97.6°F | Resp 19 | Wt 131.4 lb

## 2023-08-29 DIAGNOSIS — Z9012 Acquired absence of left breast and nipple: Secondary | ICD-10-CM | POA: Insufficient documentation

## 2023-08-29 DIAGNOSIS — Z8542 Personal history of malignant neoplasm of other parts of uterus: Secondary | ICD-10-CM | POA: Insufficient documentation

## 2023-08-29 DIAGNOSIS — Z90710 Acquired absence of both cervix and uterus: Secondary | ICD-10-CM | POA: Diagnosis not present

## 2023-08-29 DIAGNOSIS — Z90722 Acquired absence of ovaries, bilateral: Secondary | ICD-10-CM | POA: Insufficient documentation

## 2023-08-29 DIAGNOSIS — C773 Secondary and unspecified malignant neoplasm of axilla and upper limb lymph nodes: Secondary | ICD-10-CM | POA: Insufficient documentation

## 2023-08-29 DIAGNOSIS — Z5112 Encounter for antineoplastic immunotherapy: Secondary | ICD-10-CM | POA: Diagnosis not present

## 2023-08-29 DIAGNOSIS — Z853 Personal history of malignant neoplasm of breast: Secondary | ICD-10-CM | POA: Diagnosis not present

## 2023-08-29 DIAGNOSIS — Z9079 Acquired absence of other genital organ(s): Secondary | ICD-10-CM | POA: Diagnosis not present

## 2023-08-29 DIAGNOSIS — C541 Malignant neoplasm of endometrium: Secondary | ICD-10-CM

## 2023-08-29 NOTE — Progress Notes (Signed)
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 Her cancer was treated with surgery but no radiation or chemotherapy.   Serous endometrial cancer, MSI stable ER positive, PR neg, Her2/neu 3+      Endometrial cancer (HCC)  08/29/2016 Pathology Results   Endometrium, biopsy - HIGH GRADE ENDOMETRIAL CARCINOMA, SEE COMMENT. Microscopic Comment The sections show multiple fragments of adenocarcinoma displaying glandular and papillary patterns associated with high grade cytomorphology characterized by nuclear pleomorphism, prominent nucleoli and brisk mitosis. Immunohistochemical stains show that the tumor cells are positive for vimentin, p16, p53 with increased Ki-67 expression. Estrogen and progesterone receptor stains show patchy weak positivity. No significant positivity is seen with CEA. The findings are consistent with high grade endometrial carcinoma and the overall morphology and phenotypic features favor serous carcinoma.   08/29/2016 Initial Diagnosis   She presented with postmenopausal bleeding   10/03/2016 Imaging   CT C/A/P 09/2016 IMPRESSION: 1. Thickening of the endometrial canal up to 19 mm in fundus, presumably corresponding to the patient's reported endometrial carcinoma. 2. Multiple tiny pulmonary nodules scattered throughout the lungs bilaterally measuring 4 mm or less in size. Nodules of this size are typically considered statistically likely benign. In the setting of known primary malignancy, metastatic disease to the lungs is not excluded, but is not strongly favored on today's examination. Attention on followup studies is recommended to ensure the stability or resolution of these nodules. 3. Subcentimeter  low-attenuation lesion in the central aspect of segment 8 of the liver is too small to characterize. This is statistically likely a tiny cyst, but warrants attention on follow-up studies to exclude the possibility of a solitary hepatic metastasis. 4. 1.5 x 1.5 x 1.7 cm well-circumscribed lesion in the proximal stomach. This is of uncertain etiology and significance, and could represent a benign gastric polyp, however, further evaluation with nonemergent endoscopy is suggested in the near future for further evaluation. 5. **An incidental finding of potential clinical significance has been found. 1.1 x 1.6 cm thyroid nodule in the inferior aspect of the right lobe of the thyroid gland. Follow-up evaluation with nonemergent thyroid ultrasound is recommended in the near future to better evaluate this finding. This recommendation follows ACR consensuss guidelines: Managing Incidental Thyroid Nodules Detected on Imaging: White Paper of the ACR Incidental Thyroid Findings Committee. J Am Coll Radiol 2015;12(2):143-150.** 6. Aortic atherosclerosis, in addition to left anterior descending coronary artery disease   10/22/2016 Surgery   Robotic assisted total hysterectomy, BSO and bilateral pelvic lymphadenectomy  Final pathology revealed a 3cm polyp containing serous carcinoma but with no myometrial invasion, no LVSI and negative nodes.  Stage IA Uterine serous cancer   10/22/2016 Pathology Results   1. Lymph nodes, regional resection, right pelvic - SIX BENIGN LYMPH NODES (0/6). 2. Lymph nodes, regional resection, left pelvic - SEVEN BENIGN LYMPH NODES (0/7). 3. Uterus +/- tubes/ovaries, neoplastic, with right ovary and fallopian tube ENDOMYOMETRIUM - SEROUS CARCINOMA ARISING WITHIN AN ENDOMETRIAL POLYP - NO MYOMETRIAL INVASION IDENTIFIED - ADENOMYOSIS - LEIOMYOMA (1 CM) - SEE ONCOLOGY TABLE AND COMMENT CERVIX - CARCINOMA FOCALLY INVOLVES ENDOCERVICAL GLANDS - NABOTHIAN CYSTS RIGHT ADNEXA - BENIGN  OVARY AND FALLOPIAN TUBE - NO CARCINOMA IDENTIFIED 4. Cul-de-sac biopsy - MESOTHELIAL HYPERPLASIA Microscopic  Comment 3. ONCOLOGY TABLE-UTERUS, CARCINOMA OR CARCINOSARCOMA Specimen: Uterus, right fallopian tube and ovary Procedure: Total hysterectomy and right salpingo-oophorectomy Lymph node sampling performed: Bilateral pelvic regional resection Specimen integrity: Intact Maximum tumor size: 3 cm (polyp) Histologic type: Serous carcinoma Grade: High grade Myometrial invasion: Not identified Cervical stromal involvement: No, focal endocervical gland involvement Extent of involvement of other organs: Not identified Lymph - vascular invasion: Not identified Peritoneal washings: N/A Lymph nodes: Examined: 0 Sentinel 13 Non-sentinel 13 Total Lymph nodes with metastasis: 0 Isolated tumor cells (< 0.2 mm): 0 Micrometastasis: (> 0.2 mm and < 2.0 mm): 0 Macrometastasis: (> 2.0 mm): 0 Extracapsular extension: N/A Pelvic lymph nodes: 0 involved of 13 lymph nodes. Para-aortic lymph nodes: No para-aortic nodes submitted TNM code: pT1a, pNX FIGO Stage (based on pathologic findings, needs clinical correlation): IA Comment: Immunohistochemistry for cytokeratin AE1/AE3 is performed on all of the lymph nodes (parts 1 & 2) and no metastatic carcinoma is identified.   07/04/2017 Genetic Testing   The patient had genetic testing due to a personal history of breast and uterine cancer, and a family history of stomach cancer.  The Multi-Cancer Panel was ordered. The Multi-Cancer Panel offered by [facility] includes sequencing and/or deletion duplication testing of the following 83 genes: ALK, APC, ATM, AXIN2,BAP1,  BARD1, BLM, BMPR1A, BRCA1, BRCA2, BRIP1, CASR, CDC73, CDH1, CDK4, CDKN1B, CDKN1C, CDKN2A (p14ARF), CDKN2A (p16INK4a), CEBPA, CHEK2, CTNNA1, DICER1, DIS3L2, EGFR (c.2369C>T, p.Thr790Met variant only), EPCAM (Deletion/duplication testing only), FH, FLCN, GATA2, GPC3, GREM1 (Promoter region  deletion/duplication testing only), HOXB13 (c.251G>A, p.Gly84Glu), HRAS, KIT, MAX, MEN1, MET, MITF (c.952G>A, p.Glu318Lys variant only), MLH1, MSH2, MSH3, MSH6, MUTYH, NBN, NF1, NF2, NTHL1, PALB2, PDGFRA, PHOX2B, PMS2, POLD1, POLE, POT1, PRKAR1A, PTCH1, PTEN, RAD50, RAD51C, RAD51D, RB1, RECQL4, RET, RUNX1, SDHAF2, SDHA (sequence changes only), SDHB, SDHC, SDHD, SMAD4, SMARCA4, SMARCB1, SMARCE1, STK11, SUFU, TERC, TERT, TMEM127, TP53, TSC1, TSC2, VHL, WRN and WT1.   Results: No pathogenic variants were identified.  A variant of uncertain significance in the gene APC was identified.  c.791A>G (p.Gln264Arg).  The date of this test report is 07/04/2017.     Genetic Testing   Patient has genetic testing done for MSI. 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    12/03/2017 Imaging   CT Chest/Abd/Pelvis to follow pulmonary nodule and gastric mass IMPRESSION: 1. Stable CT of the chest. Small pulmonary nodules are unchanged when compared with previous exam. 2. No new findings identified. 3. Subcentimeter low-attenuation lesions within the liver are remain too small to characterize but are stable from prior exam. 4. Persistent indeterminate low-attenuation structure within the proximal stomach is unchanged measuring 1.4 cm. Correlation with direct visualization is advised   06/30/2018 Imaging   CT CHEST Lungs/Pleura: Stable scattered sub-cm pulmonary nodules are again seen bilaterally and are stable compared to previous studies. No new or enlarging pulmonary nodules or masses identified. No evidence of pulmonary infiltrate or pleural effusion.   08/19/2018 Echocardiogram   ECHO is done in [place]: EF 60-65%. Mild impaired relaxation. (report scanned)   09/17/2018 Relapse/Recurrence   Presented with c/o vagina discharge.  Lesion noted at the left vaginal apex 6mm lesion removed Path c/w high grade serous cancer   09/17/2018 Pathology Results   Vagina, biopsy, left apex -  HIGH GRADE SEROUS CARCINOMA.   09/28/2018 PET scan   1. Two hypermetabolic axial lymph nodes. Unusual site for metastatic endometrial carcinoma however the activity is more intense than typically seen in reactive adenopathy. Suggest ultrasound-guided percutaneous biopsy  of the larger RIGHT axial lymph node 2. No evidence of local recurrence at the vaginal cuff.  3. No metastatic adenopathy in the abdomen or pelvis. 4. Stable small pulmonary nodules   10/09/2018 Pathology Results   Lymph node, needle/core biopsy, right axilla - METASTATIC CARCINOMA, SEE COMMENT. Microscopic Comment The carcinoma appears high grade. Immunohistochemistry is positive for cytokeratin 7, PAX-8, and ER. Cytokeratin 20, CDX-2, PR, and GATA-3 are negative. The findings along with the history are consistent with a gynecologic primary.    10/09/2018 Procedure   Ultrasound-guided core biopsies of a right axillary lymph node.   10/14/2018 Cancer Staging   Staging form: Corpus Uteri - Carcinoma and Carcinosarcoma, AJCC 8th Edition - Clinical: Stage IVB (cT1, cN0, pM1) - Signed by Artis Delay, MD on 10/14/2018   10/19/2018 Imaging   Placement of single lumen port a cath via right internal jugular vein. The catheter tip lies at the cavo-atrial junction. A power injectable port a cath was placed and is ready for immediate use.    10/21/2018 -  Chemotherapy   The patient had carboplatin and taxol for treatment   11/11/2018 - 03/12/2022 Chemotherapy   Patient is on Treatment Plan : BREAST Trastuzumab q21d     12/21/2018 PET scan   1. Interval resolution of hypermetabolic right axillary lymph nodes. No metabolic findings highly suspicious for recurrent metastatic disease. 2. New mild hypermetabolism within a borderline prominent portacaval lymph node, nonspecific. While a reactive node is favored, a lymph node metastasis cannot be entirely excluded. Suggest attention to this lymph node on follow-up PET-CT in 3-6 months. 3.  Nonspecific new hypermetabolism at the ileocecal valve, more likely physiologic given absence of CT correlate. 4. Scattered subcentimeter pulmonary nodules are all stable and below PET resolution, more likely benign, continued CT surveillance advised. 5. Chronic findings include: Aortic Atherosclerosis (ICD10-I70.0). Marked diffuse colonic diverticulosis. Coronary atherosclerosis.     12/28/2018 Echocardiogram   1. The left ventricle has normal systolic function with an ejection fraction of 60-65%. The cavity size was normal. Left ventricular diastolic Doppler parameters are consistent with impaired relaxation.  2. GLS recorded as -10.7 but LV appears hyperdynamic and tracking of endocardium appears poor.  3. The right ventricle has normal systolic function. The cavity was normal. There is no increase in right ventricular wall thickness.  4. Mild thickening of the mitral valve leaflet.  5. The aortic valve was not well visualized. Mild thickening of the aortic valve. Aortic valve regurgitation is trivial by color flow Doppler.   03/23/2019 Imaging   PET 1. No findings of hypermetabolic residual/recurrent or metastatic disease. 2. Similar low-level hypermetabolism within normal sized portocaval and right inguinal nodes, favored to be reactive. 3. Ongoing stability of small bilateral pulmonary nodules, favored to be benign. Below PET resolution. 4. Coronary artery atherosclerosis. Aortic Atherosclerosis   03/26/2019 Echocardiogram   1. The left ventricle has normal systolic function, with an ejection fraction of 55-60%. The cavity size was normal. Left ventricular diastolic Doppler parameters are consistent with impaired relaxation.  2. The right ventricle has normal systolic function. The cavity was normal.  3. The mitral valve is abnormal. Mild thickening of the mitral valve leaflet. There is mild mitral annular calcification present.  4. The tricuspid valve is grossly normal.  5. The aortic  valve is tricuspid. Mild calcification of the aortic valve. No stenosis of the aortic valve.  6. The aorta is normal unless otherwise noted.  7. Normal LV systolic function; grade 1 diastolic dysfunction;  GLS-15.2%.   06/30/2019 Imaging   Chest Impression:   1. No evidence thoracic metastasis. 2. Stable small benign-appearing pulmonary nodules   Abdomen / Pelvis Impression:   1. No evidence of metastatic disease in the abdomen pelvis. 2.  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 3.  Aortic Atherosclerosis (ICD10-I70.0).   06/30/2019 Echocardiogram    1. Left ventricular ejection fraction, by visual estimation, is 60 to 65%. The left ventricle has normal function. There is no left ventricular hypertrophy.  2. Left ventricular diastolic parameters are consistent with Grade I diastolic dysfunction (impaired relaxation).  3. The left ventricle has no regional wall motion abnormalities.  4. Global right ventricle has normal systolic function.The right ventricular size is normal. No increase in right ventricular wall thickness.  5. Left atrial size was mild-moderately dilated.  6. Right atrial size was normal.  7. The mitral valve is normal in structure. Trivial mitral valve regurgitation.  8. The tricuspid valve is normal in structure. Tricuspid valve regurgitation is trivial.  9. The aortic valve is normal in structure. Aortic valve regurgitation is trivial. No evidence of aortic valve sclerosis or stenosis. 10. The pulmonic valve was grossly normal. Pulmonic valve regurgitation is not visualized. 11. Mildly elevated pulmonary artery systolic pressure. 12. The inferior vena cava is normal in size with greater than 50% respiratory variability, suggesting right atrial pressure of 3 mmHg. 13. The average left ventricular global longitudinal strain is -12.5 %. GLS underestimated due to poor endocardial tracking.     09/27/2019 Imaging   1. Multiple small, nonspecific pulmonary  nodules are again noted. The previously noted lung nodules are unchanged in size from previous exam. There is a new lung nodule within the medial right lower lobe which is nonspecific measure 4 mm. Attention at follow-up imaging is recommended. 2. No evidence of metastatic disease within the abdomen or pelvis. 3. Coronary artery calcifications.  The 4.  Aortic Atherosclerosis (ICD10-I70.0).   09/30/2019 Echocardiogram    1. Left ventricular ejection fraction, by estimation, is 60 to 65%. The left ventricle has normal function. The left ventricle has no regional wall motion abnormalities. Left ventricular diastolic parameters are consistent with Grade I diastolic dysfunction (impaired relaxation).  2. Right ventricular systolic function is normal. The right ventricular size is normal. There is normal pulmonary artery systolic pressure. The estimated right ventricular systolic pressure is 24.2 mmHg.  3. The mitral valve is normal in structure. No evidence of mitral valve regurgitation. No evidence of mitral stenosis.  4. The aortic valve is normal in structure. Aortic valve regurgitation is trivial. No aortic stenosis is present.  5. The inferior vena cava is normal in size with greater than 50% respiratory variability, suggesting right atrial pressure of 3 mmHg.     01/07/2020 Echocardiogram    1. Normal LV function; grade 1 diastolic dysfunction; mild AI; GLS -21%.  2. Left ventricular ejection fraction, by estimation, is 55 to 60%. The left ventricle has normal function. The left ventricle has no regional wall motion abnormalities. Left ventricular diastolic parameters are consistent with Grade I diastolic dysfunction (impaired relaxation).  3. Right ventricular systolic function is normal. The right ventricular size is normal.  4. The mitral valve is normal in structure. Trivial mitral valve regurgitation. No evidence of mitral stenosis.  5. The aortic valve is tricuspid. Aortic valve regurgitation  is mild. Mild aortic valve sclerosis is present, with no evidence of aortic valve stenosis.  6. The inferior vena cava is normal in size with  greater than 50% respiratory variability, suggesting right atrial pressure of 3 mmHg.     03/27/2020 Imaging   1. Status post hysterectomy. No evidence of recurrent or metastatic disease in the abdomen or pelvis. 2. Multiple small pulmonary nodules in the lung bases are unchanged to the extent that they are included on current examination and remain most likely sequelae of prior infection or inflammation. Attention on follow-up. 3. Aortic Atherosclerosis (ICD10-I70.0).   04/17/2020 Echocardiogram   1. Normal LVEF, normal and unchanged GLS: -20.2%.  2. Left ventricular ejection fraction, by estimation, is 60 to 65%. The left ventricle has normal function. The left ventricle has no regional wall motion abnormalities. There is mild concentric left ventricular hypertrophy. Left ventricular diastolic parameters are consistent with Grade I diastolic dysfunction (impaired relaxation). The average left ventricular global longitudinal strain is -20.2 %. The global longitudinal strain is normal.  3. Right ventricular systolic function is normal. The right ventricular size is normal.  4. The mitral valve is normal in structure. Mild mitral valve regurgitation. No evidence of mitral stenosis.  5. The aortic valve is normal in structure. There is mild calcification of the aortic valve. There is mild thickening of the aortic valve. Aortic valve regurgitation is mild. No aortic stenosis is present.  6. The inferior vena cava is normal in size with greater than 50% respiratory variability, suggesting right atrial pressure of 3 mmHg.   08/02/2020 Echocardiogram   1. Left ventricular ejection fraction, by estimation, is 55 to 60%. The left ventricle has normal function. The left ventricle has no regional wall motion abnormalities. Left ventricular diastolic parameters are  consistent with Grade I diastolic dysfunction (impaired relaxation). The average left ventricular global longitudinal strain is -27.0 %. The global longitudinal strain is normal.  2. Right ventricular systolic function is normal. The right ventricular size is normal. Tricuspid regurgitation signal is inadequate for assessing PA pressure.  3. The mitral valve is grossly normal. Trivial mitral valve regurgitation. No evidence of mitral stenosis.  4. The aortic valve is tricuspid. Aortic valve regurgitation is mild. No aortic stenosis is present.  5. The inferior vena cava is normal in size with greater than 50% respiratory variability, suggesting right atrial pressure of 3 mmHg.   09/19/2020 Imaging   1. Status post hysterectomy. No evidence of recurrent or new metastatic disease within the chest, abdomen, or pelvis. 2. Scattered bilateral tiny pulmonary nodules, unchanged from prior studies and most likely sequela of prior infection or inflammation, recommend attention on follow-up imaging. No new suspicious pulmonary nodules or masses. 3. Subcutaneous edema and small hematoma overlying the posterior gluteal musculature, recommend correlation with recent history of trauma. 4. Mild low-density wall thickening of the gastric antrum, which may represent gastritis. 5. Hepatic steatosis. 6. Extensive sigmoid colonic diverticulosis without findings of acute diverticulitis. 7. Aortic atherosclerosis.  Aortic Atherosclerosis (ICD10-I70.0).   11/20/2020 Echocardiogram    1. Compared to echo from Jan 2022, Global longitudinal strain is less negative. (Previously -27%). Left ventricular ejection fraction, by estimation, is 60 to 65%. The left ventricle has normal function. The left ventricle has no regional wall motion abnormalities. There is mild left ventricular hypertrophy. Left ventricular diastolic parameters are indeterminate.  2. Right ventricular systolic function is normal. The right ventricular size is  normal.  3. The mitral valve is normal in structure. Trivial mitral valve regurgitation.  4. Aortic valve regurgitation is mild. Mild aortic valve sclerosis is present, with no evidence of aortic valve stenosis.  03/12/2021 Echocardiogram   1. Left ventricular ejection fraction, by estimation, is 60 to 65%. Left ventricular ejection fraction by 2D MOD biplane is 63.6 %. The left ventricle has normal function. The left ventricle has no regional wall motion abnormalities. There is mild left ventricular hypertrophy. Left ventricular diastolic parameters are consistent with Grade I diastolic dysfunction (impaired relaxation). The average left ventricular global longitudinal strain is -20.0 %. The global longitudinal strain is normal.  2. Right ventricular systolic function is normal. The right ventricular size is normal. There is normal pulmonary artery systolic pressure. The estimated right ventricular systolic pressure is 20.0 mmHg.  3. The mitral valve is normal in structure. No evidence of mitral valve regurgitation. No evidence of mitral stenosis.  4. The aortic valve is tricuspid. Aortic valve regurgitation is trivial. No aortic stenosis is present. Aortic regurgitation PHT measures 403 msec.  5. The inferior vena cava is normal in size with greater than 50% respiratory variability, suggesting right atrial pressure of 3 mmHg.   04/02/2021 Imaging   Stable small pulmonary nodules scattered throughout the chest, unchanged, without new or suspicious pulmonary nodule.   Three-vessel coronary artery disease.   Aortic Atherosclerosis (ICD10-I70.0).   06/06/2021 Imaging    1. Left ventricular ejection fraction, by estimation, is 60 to 65%. The left ventricle has normal function. The left ventricle has no regional wall motion abnormalities. Left ventricular diastolic parameters are consistent with Grade I diastolic dysfunction (impaired relaxation).  2. Right ventricular systolic function is normal.  The right ventricular size is normal. There is normal pulmonary artery systolic pressure. The estimated right ventricular systolic pressure is 28.6 mmHg.  3. Left atrial size was mildly dilated.  4. The mitral valve is normal in structure. Trivial mitral valve regurgitation.  5. The aortic valve is tricuspid. Aortic valve regurgitation is mild. No aortic stenosis is present.  6. The inferior vena cava is normal in size with greater than 50% respiratory variability, suggesting right atrial pressure of 3 mmHg.  7. GLS attempted but not reported due to suboptimal tracking.     09/25/2021 Echocardiogram    1. Left ventricular ejection fraction, by estimation, is 60 to 65%. Left ventricular ejection fraction by 3D volume is 56 %. The left ventricle has normal function. The left ventricle has no regional wall motion abnormalities. Left ventricular diastolic parameters were normal.  2. Right ventricular systolic function is normal. The right ventricular size is normal. There is normal pulmonary artery systolic pressure.  3. The mitral valve is normal in structure. Trivial mitral valve regurgitation. No evidence of mitral stenosis.  4. The aortic valve is tricuspid. Aortic valve regurgitation is mild. No aortic stenosis is present.  5. The inferior vena cava is normal in size with greater than 50% respiratory variability, suggesting right atrial pressure of 3 mmHg.   10/15/2021 Imaging   1. Stable examination post prior hysterectomy without evidence of local recurrence or metastatic disease within the chest, abdomen, or pelvis. 2. Unchanged small bilateral pulmonary nodules, likely benign. 3. Left-sided colonic diverticulosis without findings of acute diverticulitis. 4. Aortic Atherosclerosis (ICD10-I70.0).       12/31/2021 Echocardiogram    1. Left ventricular ejection fraction, by estimation, is 65 to 70%. The left ventricle has normal function. The left ventricle has no regional wall motion  abnormalities. Left ventricular diastolic parameters are consistent with Grade I diastolic dysfunction (impaired relaxation).  2. Right ventricular systolic function is normal. The right ventricular size is normal. There is normal pulmonary artery  systolic pressure. The estimated right ventricular systolic pressure is 27.4 mmHg.  3. The mitral valve is grossly normal. Trivial mitral valve regurgitation. No evidence of mitral stenosis.  4. The aortic valve is tricuspid. Aortic valve regurgitation is mild. No aortic stenosis is present.  5. The inferior vena cava is normal in size with greater than 50% respiratory variability, suggesting right atrial pressure of 3 mmHg.     04/02/2022 -  Chemotherapy   Patient is on Treatment Plan : BREAST MAINTENANCE Trastuzumab IV (6) or SQ (600) D1 q21d X 11 Cycles     04/04/2022 Echocardiogram    1. Left ventricular ejection fraction, by estimation, is 65 to 70%. The left ventricle has normal function. The left ventricle has no regional wall motion abnormalities. Left ventricular diastolic parameters are consistent with Grade I diastolic dysfunction (impaired relaxation).  2. Right ventricular systolic function is normal. The right ventricular size is normal.  3. Left atrial size was mildly dilated.  4. The mitral valve is normal in structure. Trivial mitral valve regurgitation. No evidence of mitral stenosis.  5. The aortic valve is tricuspid. There is mild calcification of the aortic valve. Aortic valve regurgitation is mild. No aortic stenosis is present. Aortic regurgitation PHT measures 613 msec. Aortic valve area, by VTI measures 1.70 cm. Aortic valve mean gradient measures 3.0 mmHg. Aortic valve Vmax measures 1.15 m/s.  6. The inferior vena cava is normal in size with greater than 50% respiratory variability, suggesting right atrial pressure of 3 mmHg.   07/02/2022 Echocardiogram   1. Left ventricular ejection fraction, by estimation, is 60 to 65%. Left  ventricular ejection fraction by 3D volume is 58 %. The left ventricle has normal function. The left ventricle has no regional wall motion abnormalities. Left ventricular diastolic parameters are consistent with Grade I diastolic dysfunction (impaired relaxation). The average left ventricular global longitudinal strain is -20.7 %. The global longitudinal strain is normal.  2. Right ventricular systolic function is normal. The right ventricular size is normal. Tricuspid regurgitation signal is inadequate for assessing PA pressure.  3. Left atrial size was mildly dilated.  4. The mitral valve is normal in structure. Trivial mitral valve regurgitation. No evidence of mitral stenosis.  5. The aortic valve is grossly normal. Aortic valve regurgitation is mild. No aortic stenosis is present.  6. The inferior vena cava is normal in size with greater than 50% respiratory variability, suggesting right atrial pressure of 3 mmHg.   11/05/2022 Echocardiogram       1. Left ventricular ejection fraction, by estimation, is 55 to 60%. Left ventricular ejection fraction by 3D volume is 59 %. The left ventricle has normal function. The left ventricle has no regional wall motion abnormalities. Left ventricular diastolic  parameters are consistent with Grade I diastolic dysfunction (impaired relaxation). The average left ventricular global longitudinal strain is -16.8 %. The global longitudinal strain is normal.  2. Right ventricular systolic function is normal. The right ventricular size is normal. There is normal pulmonary artery systolic pressure.  3. The mitral valve is normal in structure. No evidence of mitral valve regurgitation. No evidence of mitral stenosis.  4. The aortic valve is normal in structure. Aortic valve regurgitation is mild. No aortic stenosis is present.  5. The inferior vena cava is normal in size with greater than 50% respiratory variability, suggesting right atrial pressure of 3 mmHg.      11/07/2022 Imaging   CT CHEST ABDOMEN PELVIS W CONTRAST  Result Date: 11/07/2022  CLINICAL DATA:  Cervical cancer. Assess treatment response. On chemotherapy * Tracking Code: BO * EXAM: CT CHEST, ABDOMEN, AND PELVIS WITH CONTRAST TECHNIQUE: Multidetector CT imaging of the chest, abdomen and pelvis was performed following the standard protocol during bolus administration of intravenous contrast. RADIATION DOSE REDUCTION: This exam was performed according to the departmental dose-optimization program which includes automated exposure control, adjustment of the mA and/or kV according to patient size and/or use of iterative reconstruction technique. CONTRAST:  OMNIPAQUE IOHEXOL 300 MG/ML  SOLN COMPARISON:  CT 10/15/2021 and older FINDINGS: CT CHEST FINDINGS Cardiovascular: Right upper chest port. Heart is nonenlarged. No pericardial effusion. The thoracic aorta has a normal course and caliber with scattered vascular calcifications. There is a bovine type aortic arch, a normal variant. Mediastinum/Nodes: Heterogeneous small thyroid gland with a nodular area in the right is unchanged from previous. As stated previously, no specific imaging follow-up. No specific abnormal lymph node enlargement seen in the axillary regions, hilum or mediastinum. Surgical clips in the left axillary region. Breast implants are noted. Normal caliber thoracic esophagus. Lungs/Pleura: There is some linear opacity lung bases likely scar or atelectasis. No pleural effusion. No consolidation or pneumothorax. There are a few scattered tiny lung nodules identified. Example 3 mm superior segment left lower lobe on series 6, image 49 which is stable. Lingular nodule measuring 4 mm on the prior, today is stable on series 6, image 73. 3 mm nodule along the margin of the minor fissure on the right is also stable. There is a larger ill-defined areas seen right lung on the prior examination series 4, image 63 which has resolved. This may have been  infectious or inflammatory. There is a small area of bronchiectasis in this location today on series 6, image 58. No new dominant lung nodule. Musculoskeletal: Diffuse moderate degenerative changes of the spine. CT ABDOMEN PELVIS FINDINGS Hepatobiliary: Fatty liver infiltration. There are some tiny low-attenuation lesion seen in the liver which are too small to completely characterize but unchanged from previous. Simple attention on follow-up for the patient's neoplasm. Example series 2, image 44 anterior to branches of the right hepatic vein. Gallbladder is nondilated. Patent portal vein. Pancreas: Unremarkable. No pancreatic ductal dilatation or surrounding inflammatory changes. Spleen: Normal in size without focal abnormality. Adrenals/Urinary Tract: The adrenal glands are preserved. No enhancing renal mass. There are some tiny low-attenuation lesion seen along each kidney which are too small to completely characterize likely small cysts. No specific imaging follow-up. Bosniak 2 lesions. Contracted urinary bladder. Stomach/Bowel: Large bowel has extensive left-sided colonic stool. Extensive colonic diverticulosis as well. The large bowel is nondilated. Small bowel is nondilated. There is a nodular area along the upper aspect of the stomach fold thickening. On series 2, image 45 this measures 18 by 14 mm. This is not well seen previously but on the prior study the stomach is more collapsed. There may have been something in retrospect in this location. There also is some nodular areas along the antrum. Recommend further evaluation to exclude underlying lesion. Vascular/Lymphatic: Normal caliber aorta and IVC with scattered vascular calcifications. No specific abnormal lymph node enlargement identified in the abdomen and pelvis. Reproductive: Status post hysterectomy. No adnexal masses. Other: No free air or free fluid. Musculoskeletal: Moderate degenerative changes of the spine and pelvis. Trace anterolisthesis at  L4-5 and L5-S1. 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 IMPRESSION: Stable tiny multiple pulmonary nodules. 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 No developing lymph node enlargement or fluid  collection. Colonic diverticulosis of the sigmoid colon with moderate stool. There are nodular masslike areas seen in the stomach including towards the fundus and small area towards the antrum with fold thickening. Hyperplastic polyps are possible. Recommend further workup of the stomach when appropriate to exclude an aggressive process. Electronically Signed   By: Karen Kays M.D.   On: 11/07/2022 13:39   ECHOCARDIOGRAM COMPLETE  Result Date: 11/05/2022    ECHOCARDIOGRAM REPORT   Patient Name:   Carlena Bjornstad Date of Exam: 11/05/2022 Medical Rec #:  [id number]      Height:       62.0 in Accession #:    [id number]     Weight:       134.8 lb Date of Birth:  12-15-1940      BSA:          1.617 m Patient Age:    81 years       BP:           144/72 mmHg Patient Gender: F              HR:           69 bpm. Exam Location:  Outpatient Procedure: 2D Echo, 3D Echo, Cardiac Doppler, Color Doppler and Strain Analysis Indications:    chemo  History:        Patient has prior history of Echocardiogram examinations. Risk                 Factors:Hypertension.  Sonographer:    Mike Gip Referring Phys: [id number] NI GORSUCH IMPRESSIONS  1. Left ventricular ejection fraction, by estimation, is 55 to 60%. Left ventricular ejection fraction by 3D volume is 59 %. The left ventricle has normal function. The left ventricle has no regional wall motion abnormalities. Left ventricular diastolic  parameters are consistent with Grade I diastolic dysfunction (impaired relaxation). The average left ventricular global longitudinal strain is -16.8 %. The global longitudinal strain is normal.  2. Right ventricular systolic function is normal. The right ventricular size is normal. There is normal pulmonary artery systolic  pressure.  3. The mitral valve is normal in structure. No evidence of mitral valve regurgitation. No evidence of mitral stenosis.  4. The aortic valve is normal in structure. Aortic valve regurgitation is mild. No aortic stenosis is present.  5. The inferior vena cava is normal in size with greater than 50% respiratory variability, suggesting right atrial pressure of 3 mmHg. Comparison(s): Prior 07/01/2022: EF 60%, GLS -20.7% Prior 03/2022: GLS-15.3. FINDINGS  Left Ventricle: Left ventricular ejection fraction, by estimation, is 55 to 60%. Left ventricular ejection fraction by 3D volume is 59 %. The left ventricle has normal function. The left ventricle has no regional wall motion abnormalities. The average left ventricular global longitudinal strain is -16.8 %. The global longitudinal strain is normal. The left ventricular internal cavity size was normal in size. There is no left ventricular hypertrophy. Left ventricular diastolic parameters are consistent  with Grade I diastolic dysfunction (impaired relaxation). Right Ventricle: The right ventricular size is normal. No increase in right ventricular wall thickness. Right ventricular systolic function is normal. There is normal pulmonary artery systolic pressure. The tricuspid regurgitant velocity is 1.79 m/s, and  with an assumed right atrial pressure of 3 mmHg, the estimated right ventricular systolic pressure is 15.8 mmHg. Left Atrium: Left atrial size was normal in size. Right Atrium: Right atrial size was normal in size. Pericardium: There is no evidence of pericardial effusion. Mitral  Valve: The mitral valve is normal in structure. No evidence of mitral valve regurgitation. No evidence of mitral valve stenosis. Tricuspid Valve: The tricuspid valve is normal in structure. Tricuspid valve regurgitation is not demonstrated. No evidence of tricuspid stenosis. Aortic Valve: The aortic valve is normal in structure. Aortic valve regurgitation is mild. Aortic  regurgitation PHT measures 497 msec. No aortic stenosis is present. Pulmonic Valve: The pulmonic valve was normal in structure. Pulmonic valve regurgitation is mild. No evidence of pulmonic stenosis. Aorta: The aortic root is normal in size and structure. Venous: The inferior vena cava is normal in size with greater than 50% respiratory variability, suggesting right atrial pressure of 3 mmHg. IAS/Shunts: No atrial level shunt detected by color flow Doppler.  LEFT VENTRICLE PLAX 2D LVIDd:         3.80 cm         Diastology LVIDs:         2.50 cm         LV e' medial:    7.51 cm/s LV PW:         1.00 cm         LV E/e' medial:  7.0 LV IVS:        1.00 cm         LV e' lateral:   8.16 cm/s LVOT diam:     1.90 cm         LV E/e' lateral: 6.4 LV SV:         49 LV SV Index:   30              2D LVOT Area:     2.84 cm        Longitudinal                                Strain                                2D Strain GLS  -16.8 % LV Volumes (MOD)               Avg: LV vol d, MOD    62.3 ml A2C:                           3D Volume EF LV vol d, MOD    68.5 ml       LV 3D EF:    Left A4C:                                        ventricul LV vol s, MOD    25.6 ml                    ar A2C:                                        ejection LV vol s, MOD    24.1 ml                    fraction A4C:  by 3D LV SV MOD A2C:   36.7 ml                    volume is LV SV MOD A4C:   68.5 ml                    59 %. LV SV MOD BP:    39.9 ml                                 3D Volume EF:                                3D EF:        59 %                                LV EDV:       116 ml                                LV ESV:       48 ml                                LV SV:        68 ml RIGHT VENTRICLE             IVC RV Basal diam:  3.00 cm     IVC diam: 1.40 cm RV S prime:     10.20 cm/s TAPSE (M-mode): 1.9 cm LEFT ATRIUM             Index        RIGHT ATRIUM           Index LA diam:        3.40 cm 2.10  cm/m   RA Area:     11.20 cm LA Vol (A2C):   49.4 ml 30.56 ml/m  RA Volume:   22.50 ml  13.92 ml/m LA Vol (A4C):   28.3 ml 17.51 ml/m LA Biplane Vol: 38.6 ml 23.88 ml/m  AORTIC VALVE LVOT Vmax:   79.80 cm/s LVOT Vmean:  49.400 cm/s LVOT VTI:    0.173 m AI PHT:      497 msec  AORTA Ao Root diam: 3.20 cm MITRAL VALVE               TRICUSPID VALVE MV Area (PHT): 2.84 cm    TR Peak grad:   12.8 mmHg MV Decel Time: 267 msec    TR Vmax:        179.00 cm/s MV E velocity: 52.40 cm/s MV A velocity: 69.80 cm/s  SHUNTS MV E/A ratio:  0.75        Systemic VTI:  0.17 m                            Systemic Diam: 1.90 cm Donato Schultz MD Electronically signed by Donato Schultz MD Signature Date/Time: 11/05/2022/2:11:35 PM    Final       02/17/2023 Echocardiogram   ECHOCARDIOGRAM COMPLETE  Result Date: 02/17/2023    ECHOCARDIOGRAM REPORT   Patient Name:   [facility]  A Chunn Date of Exam: 02/17/2023 Medical Rec #:  [id number]      Height:       62.0 in Accession #:    [id number]     Weight:       137.4 lb Date of Birth:  Nov 23, 1940      BSA:          1.630 m Patient Age:    82 years       BP:           143/77 mmHg Patient Gender: F              HR:           71 bpm. Exam Location:  Outpatient Procedure: 2D Echo, Strain Analysis, Color Doppler and Cardiac Doppler Indications:    Chemo  History:        Patient has prior history of Echocardiogram examinations, most                 recent 11/05/2022. Risk Factors:Hypertension.  Referring Phys: [id number] NI GORSUCH IMPRESSIONS  1. Left ventricular ejection fraction, by estimation, is 60 to 65%. The left ventricle has normal function. The left ventricle has no regional wall motion abnormalities. Left ventricular diastolic parameters are consistent with Grade I diastolic dysfunction (impaired relaxation).  2. Right ventricular systolic function is normal. The right ventricular size is normal.  3. The mitral valve is normal in structure. Trivial mitral valve regurgitation.  4. The aortic valve was  not well visualized. Aortic valve regurgitation is not visualized.  5. The inferior vena cava is normal in size with greater than 50% respiratory variability, suggesting right atrial pressure of 3 mmHg. FINDINGS  Left Ventricle: Left ventricular ejection fraction, by estimation, is 60 to 65%. The left ventricle has normal function. The left ventricle has no regional wall motion abnormalities. The left ventricular internal cavity size was normal in size. There is  no left ventricular hypertrophy. Left ventricular diastolic parameters are consistent with Grade I diastolic dysfunction (impaired relaxation). Right Ventricle: The right ventricular size is normal. Right ventricular systolic function is normal. Left Atrium: Left atrial size was normal in size. Right Atrium: Right atrial size was normal in size. Pericardium: There is no evidence of pericardial effusion. Mitral Valve: The mitral valve is normal in structure. Trivial mitral valve regurgitation. Tricuspid Valve: The tricuspid valve is normal in structure. Tricuspid valve regurgitation is not demonstrated. Aortic Valve: The aortic valve was not well visualized. Aortic valve regurgitation is not visualized. Pulmonic Valve: Pulmonic valve regurgitation is not visualized. Aorta: The aortic root and ascending aorta are structurally normal, with no evidence of dilitation. Venous: The inferior vena cava is normal in size with greater than 50% respiratory variability, suggesting right atrial pressure of 3 mmHg. IAS/Shunts: No atrial level shunt detected by color flow Doppler.  LEFT VENTRICLE PLAX 2D LVIDd:         4.50 cm   Diastology LVIDs:         3.10 cm   LV e' medial:    5.33 cm/s LV PW:         0.90 cm   LV E/e' medial:  13.3 LV IVS:        0.90 cm   LV e' lateral:   7.07 cm/s LVOT diam:     2.00 cm   LV E/e' lateral: 10.1 LV SV:         44 LV SV Index:   27 LVOT Area:  3.14 cm  RIGHT VENTRICLE            IVC RV S prime:     8.59 cm/s  IVC diam: 0.70 cm  TAPSE (M-mode): 1.7 cm LEFT ATRIUM             Index        RIGHT ATRIUM           Index LA Vol (A2C):   43.9 ml 26.94 ml/m  RA Area:     11.00 cm LA Vol (A4C):   35.5 ml 21.78 ml/m  RA Volume:   21.50 ml  13.19 ml/m LA Biplane Vol: 41.6 ml 25.53 ml/m  AORTIC VALVE LVOT Vmax:   59.60 cm/s LVOT Vmean:  41.100 cm/s LVOT VTI:    0.139 m MITRAL VALVE MV Area (PHT): 4.57 cm    SHUNTS MV Decel Time: 166 msec    Systemic VTI:  0.14 m MV E velocity: 71.10 cm/s  Systemic Diam: 2.00 cm MV A velocity: 78.40 cm/s MV E/A ratio:  0.91 Carolan Clines Electronically signed by Carolan Clines Signature Date/Time: 02/17/2023/12:11:53 PM    Final       06/27/2023 Echocardiogram       1. Left ventricular ejection fraction, by estimation, is 60 to 65%. The left ventricle has normal function. The left ventricle has no regional wall motion abnormalities. Left ventricular diastolic parameters were normal. The average left ventricular  global longitudinal strain is -16.5 %. The global longitudinal strain is normal.  2. Right ventricular systolic function is normal. The right ventricular size is normal.  3. The mitral valve is abnormal. No evidence of mitral valve regurgitation. No evidence of mitral stenosis.  4. The aortic valve is tricuspid. There is moderate calcification of the aortic valve. There is moderate thickening of the aortic valve. Aortic valve regurgitation is trivial. Aortic valve sclerosis is present, with no evidence of aortic valve stenosis.  5. The inferior vena cava is normal in size with greater than 50% respiratory variability, suggesting right atrial pressure of 3 mmHg.   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/14/2018 Initial Diagnosis   Metastasis to lymph nodes (HCC)   10/21/2018 - 02/24/2019 Chemotherapy   The patient had palonosetron (ALOXI) injection 0.25 mg, 0.25 mg, Intravenous,  Once, 7 of 7 cycles Administration: 0.25 mg (10/21/2018), 0.25 mg (11/11/2018), 0.25 mg (12/02/2018), 0.25 mg (12/23/2018), 0.25  mg (01/13/2019), 0.25 mg (02/03/2019), 0.25 mg (02/24/2019) CARBOplatin (PARAPLATIN) 310 mg in sodium chloride 0.9 % 250 mL chemo infusion, 310 mg, Intravenous,  Once, 7 of 7 cycles Dose modification: 300 mg (original dose 311.5 mg, Cycle 4, Reason: Dose not tolerated) Administration: 310 mg (10/21/2018), 300 mg (11/11/2018), 300 mg (12/02/2018), 300 mg (12/23/2018), 300 mg (01/13/2019), 300 mg (02/03/2019), 300 mg (02/24/2019) PACLitaxel (TAXOL) 222 mg in sodium chloride 0.9 % 250 mL chemo infusion (> 80mg /m2), 135 mg/m2 = 222 mg, Intravenous,  Once, 7 of 7 cycles Administration: 222 mg (10/21/2018), 222 mg (11/11/2018), 222 mg (12/02/2018), 222 mg (12/23/2018), 222 mg (01/13/2019), 222 mg (02/03/2019), 222 mg (02/24/2019) fosaprepitant (EMEND) 150 mg, dexamethasone (DECADRON) 12 mg in sodium chloride 0.9 % 145 mL IVPB, , Intravenous,  Once, 7 of 7 cycles Administration:  (10/21/2018),  (11/11/2018),  (12/02/2018),  (12/23/2018),  (01/13/2019),  (02/03/2019),  (02/24/2019)  for chemotherapy treatment.    04/02/2022 -  Chemotherapy   Patient is on Treatment Plan : BREAST MAINTENANCE Trastuzumab IV (6) or SQ (600) D1 q21d X 11 Cycles  Interval History: Doing very well.  Continues to spend her time split between [place] and [place].  Denies any abdominal or pelvic pain.  Denies any vaginal bleeding or discharge.  Reports baseline bowel bladder function.  Past Medical/Surgical History: Past Medical History:  Diagnosis Date   Aortic atherosclerosis (HCC)    Arthritis    Blood transfusion without reported diagnosis    Breast cancer (HCC)     LEFT, '82-left breast cancer-surgery only   Cataract    removed both eyes   Complication of anesthesia    nausea   Diverticulosis    Esophageal stricture    Family history of stomach cancer    GERD (gastroesophageal reflux disease)    Hemorrhoids    Hiatal hernia    High triglycerides    Hypercholesteremia    Hypertension    Hypothyroidism    Osteoarthritis    PONV  (postoperative nausea and vomiting)    Uterine cancer (HCC)     Past Surgical History:  Procedure Laterality Date   COLONOSCOPY     EUS N/A 01/07/2013   Procedure: UPPER ENDOSCOPIC ULTRASOUND (EUS) LINEAR;  Surgeon: Rachael Fee, MD;  Location: [facility] END[facility];  Service: Endoscopy;  Laterality: N/A;   EUS N/A 01/05/2015   Procedure: UPPER ENDOSCOPIC ULTRASOUND (EUS) LINEAR;  Surgeon: Rachael Fee, MD;  Location: [facility];  Service: Endoscopy;  Laterality: N/A;   FOOT SURGERY Left 2009-2010   x2. Surgery in 2011   IR IMAGING GUIDED PORT INSERTION  10/19/2018   LEFT HEART CATHETERIZATION WITH CORONARY ANGIOGRAM N/A 01/24/2014   Procedure: LEFT HEART CATHETERIZATION WITH CORONARY ANGIOGRAM;  Surgeon: Peter M [place], MD;  Location: [facility] CATH LAB;  Service: Cardiovascular;  Laterality: N/A;   LUMBAR DISC SURGERY  01-01-13   MASTECTOMY, RADICAL Left 1982   OVARY SURGERY  1972-1973   ROBOTIC ASSISTED TOTAL HYSTERECTOMY WITH BILATERAL SALPINGO OOPHERECTOMY N/A 10/22/2016   Procedure: XI ROBOTIC ASSISTED TOTAL HYSTERECTOMY WITH RIGHT SALPINGO OOPHORECTOMY; INJECTION OF GREEN DYE WITH EXCISION OF BILATERAL PELVIC LYMPH NODES;  Surgeon: Laurette Schimke, MD;  Location: [facility] OR[facility];  Service: Gynecology;  Laterality: N/A;   ROTATOR CUFF REPAIR Left    SENTINEL NODE BIOPSY N/A 10/22/2016   Procedure: SENTINEL NODE BIOPSY;  Surgeon: Laurette Schimke, MD;  Location: [facility];  Service: Gynecology;  Laterality: N/A;   SIMPLE MASTECTOMY Left 1982   TOTAL KNEE ARTHROPLASTY Bilateral 2009-2010   x2   UPPER GASTROINTESTINAL ENDOSCOPY      Family History  Problem Relation Age of Onset   Heart disease Mother    Other Mother        blood disease   Heart attack Father    Colon polyps Sister    Heart attack Brother    Colon polyps Brother    Stomach cancer Maternal Aunt    Colon cancer Neg Hx    Esophageal cancer Neg Hx    Rectal cancer Neg Hx    Pancreatic cancer Neg Hx     Social History   Socioeconomic  History   Marital status: Widowed    Spouse name: Not on file   Number of children: 3   Years of education: Not on file   Highest education level: Not on file  Occupational History   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   Drug use: No   Sexual activity: Never  Other Topics Concern   Not on file  Social History Narrative   Not on file   Social Drivers of Health   Financial Resource Strain: Not on file  Food Insecurity: Not on file  Transportation Needs: Not on file  Physical Activity: Not on file  Stress: Not on file  Social Connections: Not on file    Current Medications:  Current Outpatient Medications:    acetaminophen (TYLENOL) 500 MG tablet, Take 1,000 mg by mouth every 6 (six) hours as needed., Disp: , Rfl:    amLODipine (NORVASC) 5 MG tablet, Take 5 mg by mouth daily., Disp: , Rfl:    aspirin EC 81 MG tablet, Take 81 mg by mouth daily., Disp: , Rfl:    atorvastatin (LIPITOR) 20 MG tablet, Take 20 mg by mouth daily., Disp: , Rfl:    citalopram (CELEXA) 20 MG tablet, Take 20 mg by mouth daily., Disp: , Rfl:    estradiol (ESTRACE) 0.1 MG/GM vaginal cream, Place 1 Applicatorful vaginally 3 (three) times a week., Disp: 42.5 g, Rfl: 12   levothyroxine (SYNTHROID, LEVOTHROID) 100 MCG tablet, Take 100 mcg by mouth daily before breakfast. , Disp: , Rfl:    lidocaine-prilocaine (EMLA) cream, Apply 1 Application topically as needed., Disp: 30 g, Rfl: 3   metoprolol succinate (TOPROL-XL) 25 MG 24 hr tablet, Take 25 mg by mouth daily., Disp: , Rfl:    Multiple Vitamin (MULTIVITAMIN WITH MINERALS) TABS, Take 1 tablet by mouth daily., Disp: , Rfl:    olmesartan-hydrochlorothiazide (BENICAR  HCT) 40-25 MG tablet, Take 1 tablet by mouth daily., Disp: , Rfl:    pantoprazole (PROTONIX) 40 MG tablet, Take 40 mg by mouth daily., Disp: , Rfl:    valACYclovir (VALTREX) 1000 MG tablet, valacyclovir 1 gram tablet, Disp: , Rfl:    Metoprolol-Hydrochlorothiazide 25-12.5 MG TB24, Take 1 tablet every day by oral route., Disp: , Rfl:   Review of Systems: Denies appetite changes, fevers, chills, fatigue, unexplained weight changes. Denies hearing loss, neck lumps or masses, mouth sores, ringing in ears or voice changes. Denies cough or wheezing.  Denies shortness of breath. Denies chest pain or palpitations. Denies leg swelling. Denies abdominal distention, pain, blood in stools, constipation, diarrhea, nausea, vomiting, or early satiety. Denies pain with intercourse, dysuria, frequency, hematuria or incontinence. Denies hot flashes, pelvic pain, vaginal bleeding or vaginal discharge.   Denies joint pain, back pain or muscle pain/cramps. Denies itching, rash, or wounds. Denies dizziness, headaches, numbness or seizures. Denies swollen lymph nodes or glands, denies easy bruising or bleeding. Denies anxiety, depression, confusion, or decreased concentration.  Physical Exam: BP (!) 166/66 (BP Location: Right Arm, Patient Position: Sitting) Comment: Notified RN  Pulse 78   Temp 97.6 F (36.4 C) (Oral)   Resp 19   Wt 131 lb 6.4 oz (59.6 kg)   SpO2 95%   BMI 24.03 kg/m  General: Alert, oriented, no acute distress. HEENT: Normocephalic, atraumatic, sclera anicteric. Chest: Clear to auscultation bilaterally.  No wheezes or rhonchi. Cardiovascular: Regular rate and rhythm, no murmurs. Abdomen: soft, nontender.  Normoactive bowel sounds.  No masses or hepatosplenomegaly appreciated.  Well-healed scar. Extremities: Grossly normal range of motion.  Warm, well perfused.  No edema bilaterally. Skin: No rashes or lesions noted. Lymphatics: No cervical, supraclavicular,  or inguinal adenopathy. GU:  Normal appearing external genitalia without erythema, excoriation, or lesions.  Speculum exam reveals mildly atrophic vaginal mucosa, cuff intact, no lesions, bleeding, or discharge.  Bimanual exam reveals cuff intact and smooth, no nodularity or masses.  Rectovaginal exam confirms findings.  Laboratory & Radiologic Studies: Last ECHO 06/2023  Assessment & Plan: ADALAIDE JASKOLSKI is a 83 y.o. 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  PET imaging notable for hypermetabolic activity and right axillary lymph nodes. 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  Vaginal biopsy and ultrasound-guided biopsy of the right axillary node were positive for metastatic uterine serous cancer. S/p taxol/carbo, herceptin completed 02/2019, has been on maintenance herceptin since.  Last imaging 10/2022 with no evidence of metastatic or recurrent disease.  Upper endoscopy performed in July given findings in the stomach on her scan.  Findings similar to last endoscopy, few benign-appearing polyps.  Patient is overall doing very well, tolerating [name] maintenance without symptoms, and remains NED both on exam and imaging.  She will continue with [name] infusions and visits with Dr. Bertis Ruddy.  I will plan to see her every 12 months now nearly 5 years out from diagnosis of recurrence. We reviewed signs and symptoms that would be concerning for cancer recurrence and I stressed the importance of calling if she develops any of these.  20 minutes of total time was spent for this patient encounter, including preparation, face-to-face counseling with the patient and coordination of care, and documentation of the encounter.  Eugene Garnet, MD  Division of Gynecologic Oncology  Department of Obstetrics and Gynecology  [facility]

## 2023-08-29 NOTE — Patient Instructions (Signed)
It was good to see you today.  I do not see or feel any evidence of cancer recurrence on your exam.  I will see you for follow-up in 10 months.  As always, if you develop any new and concerning symptoms before your next visit, please call to see me sooner.

## 2023-08-30 ENCOUNTER — Other Ambulatory Visit: Payer: Self-pay

## 2023-09-01 DIAGNOSIS — E785 Hyperlipidemia, unspecified: Secondary | ICD-10-CM | POA: Diagnosis not present

## 2023-09-01 DIAGNOSIS — I10 Essential (primary) hypertension: Secondary | ICD-10-CM | POA: Diagnosis not present

## 2023-09-01 DIAGNOSIS — N1832 Chronic kidney disease, stage 3b: Secondary | ICD-10-CM | POA: Diagnosis not present

## 2023-09-01 DIAGNOSIS — E041 Nontoxic single thyroid nodule: Secondary | ICD-10-CM | POA: Diagnosis not present

## 2023-09-01 DIAGNOSIS — I129 Hypertensive chronic kidney disease with stage 1 through stage 4 chronic kidney disease, or unspecified chronic kidney disease: Secondary | ICD-10-CM | POA: Diagnosis not present

## 2023-09-01 DIAGNOSIS — E039 Hypothyroidism, unspecified: Secondary | ICD-10-CM | POA: Diagnosis not present

## 2023-09-01 DIAGNOSIS — R82998 Other abnormal findings in urine: Secondary | ICD-10-CM | POA: Diagnosis not present

## 2023-09-02 ENCOUNTER — Inpatient Hospital Stay: Payer: Medicare HMO | Admitting: Hematology and Oncology

## 2023-09-02 ENCOUNTER — Inpatient Hospital Stay: Payer: Medicare HMO

## 2023-09-02 ENCOUNTER — Encounter: Payer: Self-pay | Admitting: Hematology and Oncology

## 2023-09-02 VITALS — BP 154/51 | HR 71 | Temp 97.6°F | Resp 18 | Ht 62.0 in | Wt 132.2 lb

## 2023-09-02 DIAGNOSIS — Z853 Personal history of malignant neoplasm of breast: Secondary | ICD-10-CM | POA: Diagnosis not present

## 2023-09-02 DIAGNOSIS — N183 Chronic kidney disease, stage 3 unspecified: Secondary | ICD-10-CM | POA: Diagnosis not present

## 2023-09-02 DIAGNOSIS — Z9079 Acquired absence of other genital organ(s): Secondary | ICD-10-CM | POA: Diagnosis not present

## 2023-09-02 DIAGNOSIS — C541 Malignant neoplasm of endometrium: Secondary | ICD-10-CM | POA: Diagnosis not present

## 2023-09-02 DIAGNOSIS — Z8542 Personal history of malignant neoplasm of other parts of uterus: Secondary | ICD-10-CM | POA: Diagnosis not present

## 2023-09-02 DIAGNOSIS — Z5112 Encounter for antineoplastic immunotherapy: Secondary | ICD-10-CM | POA: Diagnosis not present

## 2023-09-02 DIAGNOSIS — Z9012 Acquired absence of left breast and nipple: Secondary | ICD-10-CM | POA: Diagnosis not present

## 2023-09-02 DIAGNOSIS — Z90722 Acquired absence of ovaries, bilateral: Secondary | ICD-10-CM | POA: Diagnosis not present

## 2023-09-02 DIAGNOSIS — Z90710 Acquired absence of both cervix and uterus: Secondary | ICD-10-CM | POA: Diagnosis not present

## 2023-09-02 DIAGNOSIS — C773 Secondary and unspecified malignant neoplasm of axilla and upper limb lymph nodes: Secondary | ICD-10-CM | POA: Diagnosis not present

## 2023-09-02 DIAGNOSIS — Z7189 Other specified counseling: Secondary | ICD-10-CM

## 2023-09-02 LAB — COMPREHENSIVE METABOLIC PANEL
ALT: 12 U/L (ref 0–44)
AST: 19 U/L (ref 15–41)
Albumin: 4.2 g/dL (ref 3.5–5.0)
Alkaline Phosphatase: 78 U/L (ref 38–126)
Anion gap: 9 (ref 5–15)
BUN: 23 mg/dL (ref 8–23)
CO2: 27 mmol/L (ref 22–32)
Calcium: 9.4 mg/dL (ref 8.9–10.3)
Chloride: 102 mmol/L (ref 98–111)
Creatinine, Ser: 1.18 mg/dL — ABNORMAL HIGH (ref 0.44–1.00)
GFR, Estimated: 46 mL/min — ABNORMAL LOW (ref >60)
Glucose, Bld: 127 mg/dL — ABNORMAL HIGH (ref 70–99)
Potassium: 3.5 mmol/L (ref 3.5–5.1)
Sodium: 138 mmol/L (ref 135–145)
Total Bilirubin: 0.5 mg/dL (ref 0.0–1.2)
Total Protein: 6.7 g/dL (ref 6.5–8.1)

## 2023-09-02 LAB — CBC WITH DIFFERENTIAL/PLATELET
Abs Immature Granulocytes: 0.03 10*3/uL (ref 0.00–0.07)
Basophils Absolute: 0.1 10*3/uL (ref 0.0–0.1)
Basophils Relative: 2 %
Eosinophils Absolute: 0.2 10*3/uL (ref 0.0–0.5)
Eosinophils Relative: 4 %
HCT: 33.1 % — ABNORMAL LOW (ref 36.0–46.0)
Hemoglobin: 11.5 g/dL — ABNORMAL LOW (ref 12.0–15.0)
Immature Granulocytes: 1 %
Lymphocytes Relative: 19 %
Lymphs Abs: 1.2 10*3/uL (ref 0.7–4.0)
MCH: 30.7 pg (ref 26.0–34.0)
MCHC: 34.7 g/dL (ref 30.0–36.0)
MCV: 88.5 fL (ref 80.0–100.0)
Monocytes Absolute: 0.5 10*3/uL (ref 0.1–1.0)
Monocytes Relative: 7 %
Neutro Abs: 4.4 10*3/uL (ref 1.7–7.7)
Neutrophils Relative %: 67 %
Platelets: 227 10*3/uL (ref 150–400)
RBC: 3.74 MIL/uL — ABNORMAL LOW (ref 3.87–5.11)
RDW: 11.8 % (ref 11.5–15.5)
WBC: 6.5 10*3/uL (ref 4.0–10.5)
nRBC: 0 % (ref 0.0–0.2)

## 2023-09-02 MED ORDER — SODIUM CHLORIDE 0.9 % IV SOLN: Freq: Once | INTRAVENOUS | Status: AC

## 2023-09-02 MED ORDER — TRASTUZUMAB-ANNS CHEMO 150 MG IV SOLR
6.0000 mg/kg | Freq: Once | INTRAVENOUS | Status: AC
  Administered 2023-09-02: 357 mg via INTRAVENOUS
  Filled 2023-09-02: qty 17

## 2023-09-02 MED ORDER — HEPARIN SOD (PORK) LOCK FLUSH 100 UNIT/ML IV SOLN
500.0000 [IU] | Freq: Once | INTRAVENOUS | Status: AC | PRN
  Administered 2023-09-02: 500 [IU]

## 2023-09-02 MED ORDER — SODIUM CHLORIDE 0.9% FLUSH
10.0000 mL | INTRAVENOUS | Status: DC | PRN
  Administered 2023-09-02: 10 mL

## 2023-09-02 MED ORDER — SODIUM CHLORIDE 0.9% FLUSH
10.0000 mL | Freq: Once | INTRAVENOUS | Status: AC
  Administered 2023-09-02: 10 mL

## 2023-09-02 NOTE — Assessment & Plan Note (Signed)
 Renal function is stable. Monitor closely every other visit

## 2023-09-02 NOTE — Progress Notes (Signed)
[facility] OFFICE PROGRESS NOTE  Patient Care Team: Geoffry Paradise, [name]  ASSESSMENT & PLAN:  Endometrial cancer [facility]) Her last imaging showed no evidence of cancer recurrence, due in April 2025 With stability of her cardiac function, I will also change echocardiogram monitoring to once every 4 months, due next April She will continue trastuzumab indefinitely   CKD (chronic kidney disease), stage III (HCC) Renal function is stable. Monitor closely every other visit  Orders Placed This Encounter  Procedures   CT CHEST ABDOMEN PELVIS W CONTRAST    Standing Status:   Future    Expected Date:   10/22/2023    Expiration Date:   09/01/2024    Scheduling Instructions:     No need oral contrast    If indicated for the ordered procedure, I authorize the administration of contrast media per Radiology protocol:   Yes    Does the patient have a contrast media/X-ray dye allergy?:   No    Preferred imaging location?:   [facility]    If indicated for the ordered procedure, I authorize the administration of oral contrast media per Radiology protocol:   Yes   ECHOCARDIOGRAM COMPLETE    Standing Status:   Future    Expected Date:   10/22/2023    Expiration Date:   09/01/2024    Where should this test be performed:   [place]    Perflutren DEFINITY (image enhancing agent) should be administered unless hypersensitivity or allergy exist:   Administer Perflutren    Reason for exam-Echo:   Chemo  Z09    All questions were answered. The patient knows to call the clinic with any problems, questions or concerns. The total time spent in the appointment was 25 minutes encounter with patients including review of chart and various tests results, discussions about plan of care and coordination of care plan   Artis Delay, MD 09/02/2023 12:47 PM  INTERVAL HISTORY: Please see below for problem oriented charting. she returns for chemo follow-up on  maintenance trastuzumab She denies side effects from treatment We discussed timing of next imaging and echocardiogram  REVIEW OF SYSTEMS:   Constitutional: Denies fevers, chills or abnormal weight loss Eyes: Denies blurriness of vision Ears, nose, mouth, throat, and face: Denies mucositis or sore throat Respiratory: Denies cough, dyspnea or wheezes Cardiovascular: Denies palpitation, chest discomfort or lower extremity swelling Gastrointestinal:  Denies nausea, heartburn or change in bowel habits Skin: Denies abnormal skin rashes Lymphatics: Denies new lymphadenopathy or easy bruising Neurological:Denies numbness, tingling or new weaknesses Behavioral/Psych: Mood is stable, no new changes  All other systems were reviewed with the patient and are negative.  I have reviewed the past medical history, past surgical history, social history and family history with the patient and they are unchanged from previous note.  ALLERGIES:  is allergic to trazodone hcl.  MEDICATIONS:  Current Outpatient Medications  Medication Sig Dispense Refill   acetaminophen (TYLENOL) 500 MG tablet Take 1,000 mg by mouth every 6 (six) hours as needed.     amLODipine (NORVASC) 5 MG tablet Take 5 mg by mouth daily.     aspirin EC 81 MG tablet Take 81 mg by mouth daily.     atorvastatin (LIPITOR) 20 MG tablet Take 20 mg by mouth daily.     citalopram (CELEXA) 20 MG tablet Take 20 mg by mouth daily.     estradiol (ESTRACE) 0.1 MG/GM vaginal cream Place 1 Applicatorful vaginally  3 (three) times a week. 42.5 g 12   levothyroxine (SYNTHROID, LEVOTHROID) 100 MCG tablet Take 100 mcg by mouth daily before breakfast.      lidocaine-prilocaine (EMLA) cream Apply 1 Application topically as needed. 30 g 3   metoprolol succinate (TOPROL-XL) 25 MG 24 hr tablet Take 25 mg by mouth daily.     Metoprolol-Hydrochlorothiazide 25-12.5 MG TB24 Take 1 tablet every day by oral route.     Multiple Vitamin (MULTIVITAMIN WITH MINERALS)  TABS Take 1 tablet by mouth daily.     olmesartan-hydrochlorothiazide (BENICAR HCT) 40-25 MG tablet Take 1 tablet by mouth daily.     pantoprazole (PROTONIX) 40 MG tablet Take 40 mg by mouth daily.     valACYclovir (VALTREX) 1000 MG tablet valacyclovir 1 gram tablet     No current facility-administered medications for this visit.   Facility-Administered Medications Ordered in Other Visits  Medication Dose Route Frequency Provider Last Rate Last Admin   heparin lock flush 100 unit/mL  500 Units Intracatheter Once PRN Bertis Ruddy, Rashada Klontz, MD       sodium chloride flush (NS) 0.9 % injection 10 mL  10 mL Intracatheter PRN Bertis Ruddy, Karlene Southard, MD       trastuzumab-anns (KANJINTI) 357 mg in sodium chloride 0.9 % 250 mL chemo infusion  6 mg/kg (Treatment Plan Recorded) Intravenous Once Artis Delay, MD 534 mL/hr at 09/02/23 1243 357 mg at 09/02/23 1243    SUMMARY OF ONCOLOGIC HISTORY: Oncology History Overview Note  LISET MCMONIGLE  has a remote history of left  breast cancer at age 61 but received BRCA testing approximately 5 years ago which was negative. Her cancer was treated with surgery but no radiation or chemotherapy.   Serous endometrial cancer, MSI stable ER positive, PR neg, Her2/neu 3+      Endometrial cancer (HCC)  08/29/2016 Pathology Results   Endometrium, biopsy - HIGH GRADE ENDOMETRIAL CARCINOMA, SEE COMMENT. Microscopic Comment The sections show multiple fragments of adenocarcinoma displaying glandular and papillary patterns associated with high grade cytomorphology characterized by nuclear pleomorphism, prominent nucleoli and brisk mitosis. Immunohistochemical stains show that the tumor cells are positive for vimentin, p16, p53 with increased Ki-67 expression. Estrogen and progesterone receptor stains show patchy weak positivity. No significant positivity is seen with CEA. The findings are consistent with high grade endometrial carcinoma and the overall morphology and phenotypic features favor  serous carcinoma.   08/29/2016 Initial Diagnosis   She presented with postmenopausal bleeding   10/03/2016 Imaging   CT C/A/P 09/2016 IMPRESSION: 1. Thickening of the endometrial canal up to 19 mm in fundus, presumably corresponding to the patient's reported endometrial carcinoma. 2. Multiple tiny pulmonary nodules scattered throughout the lungs bilaterally measuring 4 mm or less in size. Nodules of this size are typically considered statistically likely benign. In the setting of known primary malignancy, metastatic disease to the lungs is not excluded, but is not strongly favored on today's examination. Attention on followup studies is recommended to ensure the stability or resolution of these nodules. 3. Subcentimeter low-attenuation lesion in the central aspect of segment 8 of the liver is too small to characterize. This is statistically likely a tiny cyst, but warrants attention on follow-up studies to exclude the possibility of a solitary hepatic metastasis. 4. 1.5 x 1.5 x 1.7 cm well-circumscribed lesion in the proximal stomach. This is of uncertain etiology and significance, and could represent a benign gastric polyp, however, further evaluation with nonemergent endoscopy is suggested in the near future for further evaluation. 5. **  An incidental finding of potential clinical significance has been found. 1.1 x 1.6 cm thyroid nodule in the inferior aspect of the right lobe of the thyroid gland. Follow-up evaluation with nonemergent thyroid ultrasound is recommended in the near future to better evaluate this finding. This recommendation follows ACR consensuss guidelines: Managing Incidental Thyroid Nodules Detected on Imaging: White Paper of the ACR Incidental Thyroid Findings Committee. J Am Coll Radiol 2015;12(2):143-150.** 6. Aortic atherosclerosis, in addition to left anterior descending coronary artery disease   10/22/2016 Surgery   Robotic assisted total hysterectomy, BSO and bilateral pelvic  lymphadenectomy  Final pathology revealed a 3cm polyp containing serous carcinoma but with no myometrial invasion, no LVSI and negative nodes.  Stage IA Uterine serous cancer   10/22/2016 Pathology Results   1. Lymph nodes, regional resection, right pelvic - SIX BENIGN LYMPH NODES (0/6). 2. Lymph nodes, regional resection, left pelvic - SEVEN BENIGN LYMPH NODES (0/7). 3. Uterus +/- tubes/ovaries, neoplastic, with right ovary and fallopian tube ENDOMYOMETRIUM - SEROUS CARCINOMA ARISING WITHIN AN ENDOMETRIAL POLYP - NO MYOMETRIAL INVASION IDENTIFIED - ADENOMYOSIS - LEIOMYOMA (1 CM) - SEE ONCOLOGY TABLE AND COMMENT CERVIX - CARCINOMA FOCALLY INVOLVES ENDOCERVICAL GLANDS - NABOTHIAN CYSTS RIGHT ADNEXA - BENIGN OVARY AND FALLOPIAN TUBE - NO CARCINOMA IDENTIFIED 4. Cul-de-sac biopsy - MESOTHELIAL HYPERPLASIA Microscopic Comment 3. ONCOLOGY TABLE-UTERUS, CARCINOMA OR CARCINOSARCOMA Specimen: Uterus, right fallopian tube and ovary Procedure: Total hysterectomy and right salpingo-oophorectomy Lymph node sampling performed: Bilateral pelvic regional resection Specimen integrity: Intact Maximum tumor size: 3 cm (polyp) Histologic type: Serous carcinoma Grade: High grade Myometrial invasion: Not identified Cervical stromal involvement: No, focal endocervical gland involvement Extent of involvement of other organs: Not identified Lymph - vascular invasion: Not identified Peritoneal washings: N/A Lymph nodes: Examined: 0 Sentinel 13 Non-sentinel 13 Total Lymph nodes with metastasis: 0 Isolated tumor cells (< 0.2 mm): 0 Micrometastasis: (> 0.2 mm and < 2.0 mm): 0 Macrometastasis: (> 2.0 mm): 0 Extracapsular extension: N/A Pelvic lymph nodes: 0 involved of 13 lymph nodes. Para-aortic lymph nodes: No para-aortic nodes submitted TNM code: pT1a, pNX FIGO Stage (based on pathologic findings, needs clinical correlation): IA Comment: Immunohistochemistry for cytokeratin AE1/AE3 is  performed on all of the lymph nodes (parts 1 & 2) and no metastatic carcinoma is identified.   07/04/2017 Genetic Testing   The patient had genetic testing due to a personal history of breast and uterine cancer, and a family history of stomach cancer.  The Multi-Cancer Panel was ordered. The Multi-Cancer Panel offered by [facility] includes sequencing and/or deletion duplication testing of the following 83 genes: ALK, APC, ATM, AXIN2,BAP1,  BARD1, BLM, BMPR1A, BRCA1, BRCA2, BRIP1, CASR, CDC73, CDH1, CDK4, CDKN1B, CDKN1C, CDKN2A (p14ARF), CDKN2A (p16INK4a), CEBPA, CHEK2, CTNNA1, DICER1, DIS3L2, EGFR (c.2369C>T, p.Thr790Met variant only), EPCAM (Deletion/duplication testing only), FH, FLCN, GATA2, GPC3, GREM1 (Promoter region deletion/duplication testing only), HOXB13 (c.251G>A, p.Gly84Glu), HRAS, KIT, MAX, MEN1, MET, MITF (c.952G>A, p.Glu318Lys variant only), MLH1, MSH2, MSH3, MSH6, MUTYH, NBN, NF1, NF2, NTHL1, PALB2, PDGFRA, PHOX2B, PMS2, POLD1, POLE, POT1, PRKAR1A, PTCH1, PTEN, RAD50, RAD51C, RAD51D, RB1, RECQL4, RET, RUNX1, SDHAF2, SDHA (sequence changes only), SDHB, SDHC, SDHD, SMAD4, SMARCA4, SMARCB1, SMARCE1, STK11, SUFU, TERC, TERT, TMEM127, TP53, TSC1, TSC2, VHL, WRN and WT1.   Results: No pathogenic variants were identified.  A variant of uncertain significance in the gene APC was identified.  c.791A>G (p.Gln264Arg).  The date of this test report is 07/04/2017.     Genetic Testing   Patient has genetic testing done for MSI. Results revealed patient  is MSI stable on surgical pathology from 10/22/2016.    12/03/2017 Imaging   CT Chest/Abd/Pelvis to follow pulmonary nodule and gastric mass IMPRESSION: 1. Stable CT of the chest. Small pulmonary nodules are unchanged when compared with previous exam. 2. No new findings identified. 3. Subcentimeter low-attenuation lesions within the liver are remain too small to characterize but are stable from prior exam. 4. Persistent indeterminate low-attenuation  structure within the proximal stomach is unchanged measuring 1.4 cm. Correlation with direct visualization is advised   06/30/2018 Imaging   CT CHEST Lungs/Pleura: Stable scattered sub-cm pulmonary nodules are again seen bilaterally and are stable compared to previous studies. No new or enlarging pulmonary nodules or masses identified. No evidence of pulmonary infiltrate or pleural effusion.   08/19/2018 Echocardiogram   [place] is done in [place]: EF 60-65%. Mild impaired relaxation. (report scanned)   09/17/2018 Relapse/Recurrence   Presented with c/o vagina discharge.  Lesion noted at the left vaginal apex 6mm lesion removed Path c/w high grade serous cancer   09/17/2018 Pathology Results   Vagina, biopsy, left apex - HIGH GRADE SEROUS CARCINOMA.   09/28/2018 PET scan   1. Two hypermetabolic axial lymph nodes. Unusual site for metastatic endometrial carcinoma however the activity is more intense than typically seen in reactive adenopathy. Suggest ultrasound-guided percutaneous biopsy of the larger RIGHT axial lymph node 2. No evidence of local recurrence at the vaginal cuff.  3. No metastatic adenopathy in the abdomen or pelvis. 4. Stable small pulmonary nodules   10/09/2018 Pathology Results   Lymph node, needle/core biopsy, right axilla - METASTATIC CARCINOMA, SEE COMMENT. Microscopic Comment The carcinoma appears high grade. Immunohistochemistry is positive for cytokeratin 7, PAX-8, and ER. Cytokeratin 20, CDX-2, PR, and GATA-3 are negative. The findings along with the history are consistent with a gynecologic primary.    10/09/2018 Procedure   Ultrasound-guided core biopsies of a right axillary lymph node.   10/14/2018 Cancer Staging   Staging form: Corpus Uteri - Carcinoma and Carcinosarcoma, AJCC 8th Edition - Clinical: Stage IVB (cT1, cN0, pM1) - Signed by Artis Delay, MD on 10/14/2018   10/19/2018 Imaging   Placement of single lumen port a cath via right internal jugular vein. The catheter  tip lies at the cavo-atrial junction. A power injectable port a cath was placed and is ready for immediate use.    10/21/2018 -  Chemotherapy   The patient had carboplatin and taxol for treatment   11/11/2018 - 03/12/2022 Chemotherapy   Patient is on Treatment Plan : BREAST Trastuzumab q21d     12/21/2018 PET scan   1. Interval resolution of hypermetabolic right axillary lymph nodes. No metabolic findings highly suspicious for recurrent metastatic disease. 2. New mild hypermetabolism within a borderline prominent portacaval lymph node, nonspecific. While a reactive node is favored, a lymph node metastasis cannot be entirely excluded. Suggest attention to this lymph node on follow-up PET-CT in 3-6 months. 3. Nonspecific new hypermetabolism at the ileocecal valve, more likely physiologic given absence of CT correlate. 4. Scattered subcentimeter pulmonary nodules are all stable and below PET resolution, more likely benign, continued CT surveillance advised. 5. Chronic findings include: Aortic Atherosclerosis (ICD10-I70.0). Marked diffuse colonic diverticulosis. Coronary atherosclerosis.     12/28/2018 Echocardiogram   1. The left ventricle has normal systolic function with an ejection fraction of 60-65%. The cavity size was normal. Left ventricular diastolic Doppler parameters are consistent with impaired relaxation.  2. GLS recorded as -10.7 but LV appears hyperdynamic and tracking of endocardium appears  poor.  3. The right ventricle has normal systolic function. The cavity was normal. There is no increase in right ventricular wall thickness.  4. Mild thickening of the mitral valve leaflet.  5. The aortic valve was not well visualized. Mild thickening of the aortic valve. Aortic valve regurgitation is trivial by color flow Doppler.   03/23/2019 Imaging   PET 1. No findings of hypermetabolic residual/recurrent or metastatic disease. 2. Similar low-level hypermetabolism within normal sized  portocaval and right inguinal nodes, favored to be reactive. 3. Ongoing stability of small bilateral pulmonary nodules, favored to be benign. Below PET resolution. 4. Coronary artery atherosclerosis. Aortic Atherosclerosis   03/26/2019 Echocardiogram   1. The left ventricle has normal systolic function, with an ejection fraction of 55-60%. The cavity size was normal. Left ventricular diastolic Doppler parameters are consistent with impaired relaxation.  2. The right ventricle has normal systolic function. The cavity was normal.  3. The mitral valve is abnormal. Mild thickening of the mitral valve leaflet. There is mild mitral annular calcification present.  4. The tricuspid valve is grossly normal.  5. The aortic valve is tricuspid. Mild calcification of the aortic valve. No stenosis of the aortic valve.  6. The aorta is normal unless otherwise noted.  7. Normal LV systolic function; grade 1 diastolic dysfunction; GLS-15.2%.   06/30/2019 Imaging   Chest Impression:   1. No evidence thoracic metastasis. 2. Stable small benign-appearing pulmonary nodules   Abdomen / Pelvis Impression:   1. No evidence of metastatic disease in the abdomen pelvis. 2.  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 3.  Aortic Atherosclerosis (ICD10-I70.0).   06/30/2019 Echocardiogram    1. Left ventricular ejection fraction, by visual estimation, is 60 to 65%. The left ventricle has normal function. There is no left ventricular hypertrophy.  2. Left ventricular diastolic parameters are consistent with Grade I diastolic dysfunction (impaired relaxation).  3. The left ventricle has no regional wall motion abnormalities.  4. Global right ventricle has normal systolic function.The right ventricular size is normal. No increase in right ventricular wall thickness.  5. Left atrial size was mild-moderately dilated.  6. Right atrial size was normal.  7. The mitral valve is normal in structure. Trivial  mitral valve regurgitation.  8. The tricuspid valve is normal in structure. Tricuspid valve regurgitation is trivial.  9. The aortic valve is normal in structure. Aortic valve regurgitation is trivial. No evidence of aortic valve sclerosis or stenosis. 10. The pulmonic valve was grossly normal. Pulmonic valve regurgitation is not visualized. 11. Mildly elevated pulmonary artery systolic pressure. 12. The inferior vena cava is normal in size with greater than 50% respiratory variability, suggesting right atrial pressure of 3 mmHg. 13. The average left ventricular global longitudinal strain is -12.5 %. GLS underestimated due to poor endocardial tracking.     09/27/2019 Imaging   1. Multiple small, nonspecific pulmonary nodules are again noted. The previously noted lung nodules are unchanged in size from previous exam. There is a new lung nodule within the medial right lower lobe which is nonspecific measure 4 mm. Attention at follow-up imaging is recommended. 2. No evidence of metastatic disease within the abdomen or pelvis. 3. Coronary artery calcifications.  The 4.  Aortic Atherosclerosis (ICD10-I70.0).   09/30/2019 Echocardiogram    1. Left ventricular ejection fraction, by estimation, is 60 to 65%. The left ventricle has normal function. The left ventricle has no regional wall motion abnormalities. Left ventricular diastolic parameters are consistent with Grade I diastolic  dysfunction (impaired relaxation).  2. Right ventricular systolic function is normal. The right ventricular size is normal. There is normal pulmonary artery systolic pressure. The estimated right ventricular systolic pressure is 24.2 mmHg.  3. The mitral valve is normal in structure. No evidence of mitral valve regurgitation. No evidence of mitral stenosis.  4. The aortic valve is normal in structure. Aortic valve regurgitation is trivial. No aortic stenosis is present.  5. The inferior vena cava is normal in size with greater  than 50% respiratory variability, suggesting right atrial pressure of 3 mmHg.     01/07/2020 Echocardiogram    1. Normal LV function; grade 1 diastolic dysfunction; mild AI; GLS -21%.  2. Left ventricular ejection fraction, by estimation, is 55 to 60%. The left ventricle has normal function. The left ventricle has no regional wall motion abnormalities. Left ventricular diastolic parameters are consistent with Grade I diastolic dysfunction (impaired relaxation).  3. Right ventricular systolic function is normal. The right ventricular size is normal.  4. The mitral valve is normal in structure. Trivial mitral valve regurgitation. No evidence of mitral stenosis.  5. The aortic valve is tricuspid. Aortic valve regurgitation is mild. Mild aortic valve sclerosis is present, with no evidence of aortic valve stenosis.  6. The inferior vena cava is normal in size with greater than 50% respiratory variability, suggesting right atrial pressure of 3 mmHg.     03/27/2020 Imaging   1. Status post hysterectomy. No evidence of recurrent or metastatic disease in the abdomen or pelvis. 2. Multiple small pulmonary nodules in the lung bases are unchanged to the extent that they are included on current examination and remain most likely sequelae of prior infection or inflammation. Attention on follow-up. 3. Aortic Atherosclerosis (ICD10-I70.0).   04/17/2020 Echocardiogram   1. Normal LVEF, normal and unchanged GLS: -20.2%.  2. Left ventricular ejection fraction, by estimation, is 60 to 65%. The left ventricle has normal function. The left ventricle has no regional wall motion abnormalities. There is mild concentric left ventricular hypertrophy. Left ventricular diastolic parameters are consistent with Grade I diastolic dysfunction (impaired relaxation). The average left ventricular global longitudinal strain is -20.2 %. The global longitudinal strain is normal.  3. Right ventricular systolic function is normal. The  right ventricular size is normal.  4. The mitral valve is normal in structure. Mild mitral valve regurgitation. No evidence of mitral stenosis.  5. The aortic valve is normal in structure. There is mild calcification of the aortic valve. There is mild thickening of the aortic valve. Aortic valve regurgitation is mild. No aortic stenosis is present.  6. The inferior vena cava is normal in size with greater than 50% respiratory variability, suggesting right atrial pressure of 3 mmHg.   08/02/2020 Echocardiogram   1. Left ventricular ejection fraction, by estimation, is 55 to 60%. The left ventricle has normal function. The left ventricle has no regional wall motion abnormalities. Left ventricular diastolic parameters are consistent with Grade I diastolic dysfunction (impaired relaxation). The average left ventricular global longitudinal strain is -27.0 %. The global longitudinal strain is normal.  2. Right ventricular systolic function is normal. The right ventricular size is normal. Tricuspid regurgitation signal is inadequate for assessing PA pressure.  3. The mitral valve is grossly normal. Trivial mitral valve regurgitation. No evidence of mitral stenosis.  4. The aortic valve is tricuspid. Aortic valve regurgitation is mild. No aortic stenosis is present.  5. The inferior vena cava is normal in size with greater than 50% respiratory  variability, suggesting right atrial pressure of 3 mmHg.   09/19/2020 Imaging   1. Status post hysterectomy. No evidence of recurrent or new metastatic disease within the chest, abdomen, or pelvis. 2. Scattered bilateral tiny pulmonary nodules, unchanged from prior studies and most likely sequela of prior infection or inflammation, recommend attention on follow-up imaging. No new suspicious pulmonary nodules or masses. 3. Subcutaneous edema and small hematoma overlying the posterior gluteal musculature, recommend correlation with recent history of trauma. 4. Mild  low-density wall thickening of the gastric antrum, which may represent gastritis. 5. Hepatic steatosis. 6. Extensive sigmoid colonic diverticulosis without findings of acute diverticulitis. 7. Aortic atherosclerosis.  Aortic Atherosclerosis (ICD10-I70.0).   11/20/2020 Echocardiogram    1. Compared to echo from Jan 2022, Global longitudinal strain is less negative. (Previously -27%). Left ventricular ejection fraction, by estimation, is 60 to 65%. The left ventricle has normal function. The left ventricle has no regional wall motion abnormalities. There is mild left ventricular hypertrophy. Left ventricular diastolic parameters are indeterminate.  2. Right ventricular systolic function is normal. The right ventricular size is normal.  3. The mitral valve is normal in structure. Trivial mitral valve regurgitation.  4. Aortic valve regurgitation is mild. Mild aortic valve sclerosis is present, with no evidence of aortic valve stenosis.     03/12/2021 Echocardiogram   1. Left ventricular ejection fraction, by estimation, is 60 to 65%. Left ventricular ejection fraction by 2D MOD biplane is 63.6 %. The left ventricle has normal function. The left ventricle has no regional wall motion abnormalities. There is mild left ventricular hypertrophy. Left ventricular diastolic parameters are consistent with Grade I diastolic dysfunction (impaired relaxation). The average left ventricular global longitudinal strain is -20.0 %. The global longitudinal strain is normal.  2. Right ventricular systolic function is normal. The right ventricular size is normal. There is normal pulmonary artery systolic pressure. The estimated right ventricular systolic pressure is 20.0 mmHg.  3. The mitral valve is normal in structure. No evidence of mitral valve regurgitation. No evidence of mitral stenosis.  4. The aortic valve is tricuspid. Aortic valve regurgitation is trivial. No aortic stenosis is present. Aortic regurgitation PHT  measures 403 msec.  5. The inferior vena cava is normal in size with greater than 50% respiratory variability, suggesting right atrial pressure of 3 mmHg.   04/02/2021 Imaging   Stable small pulmonary nodules scattered throughout the chest, unchanged, without new or suspicious pulmonary nodule.   Three-vessel coronary artery disease.   Aortic Atherosclerosis (ICD10-I70.0).   06/06/2021 Imaging    1. Left ventricular ejection fraction, by estimation, is 60 to 65%. The left ventricle has normal function. The left ventricle has no regional wall motion abnormalities. Left ventricular diastolic parameters are consistent with Grade I diastolic dysfunction (impaired relaxation).  2. Right ventricular systolic function is normal. The right ventricular size is normal. There is normal pulmonary artery systolic pressure. The estimated right ventricular systolic pressure is 28.6 mmHg.  3. Left atrial size was mildly dilated.  4. The mitral valve is normal in structure. Trivial mitral valve regurgitation.  5. The aortic valve is tricuspid. Aortic valve regurgitation is mild. No aortic stenosis is present.  6. The inferior vena cava is normal in size with greater than 50% respiratory variability, suggesting right atrial pressure of 3 mmHg.  7. GLS attempted but not reported due to suboptimal tracking.     09/25/2021 Echocardiogram    1. Left ventricular ejection fraction, by estimation, is 60 to 65%. Left ventricular  ejection fraction by 3D volume is 56 %. The left ventricle has normal function. The left ventricle has no regional wall motion abnormalities. Left ventricular diastolic parameters were normal.  2. Right ventricular systolic function is normal. The right ventricular size is normal. There is normal pulmonary artery systolic pressure.  3. The mitral valve is normal in structure. Trivial mitral valve regurgitation. No evidence of mitral stenosis.  4. The aortic valve is tricuspid. Aortic valve  regurgitation is mild. No aortic stenosis is present.  5. The inferior vena cava is normal in size with greater than 50% respiratory variability, suggesting right atrial pressure of 3 mmHg.   10/15/2021 Imaging   1. Stable examination post prior hysterectomy without evidence of local recurrence or metastatic disease within the chest, abdomen, or pelvis. 2. Unchanged small bilateral pulmonary nodules, likely benign. 3. Left-sided colonic diverticulosis without findings of acute diverticulitis. 4. Aortic Atherosclerosis (ICD10-I70.0).       12/31/2021 Echocardiogram    1. Left ventricular ejection fraction, by estimation, is 65 to 70%. The left ventricle has normal function. The left ventricle has no regional wall motion abnormalities. Left ventricular diastolic parameters are consistent with Grade I diastolic dysfunction (impaired relaxation).  2. Right ventricular systolic function is normal. The right ventricular size is normal. There is normal pulmonary artery systolic pressure. The estimated right ventricular systolic pressure is 27.4 mmHg.  3. The mitral valve is grossly normal. Trivial mitral valve regurgitation. No evidence of mitral stenosis.  4. The aortic valve is tricuspid. Aortic valve regurgitation is mild. No aortic stenosis is present.  5. The inferior vena cava is normal in size with greater than 50% respiratory variability, suggesting right atrial pressure of 3 mmHg.     04/02/2022 -  Chemotherapy   Patient is on Treatment Plan : BREAST MAINTENANCE Trastuzumab IV (6) or SQ (600) D1 q21d X 11 Cycles     04/04/2022 Echocardiogram    1. Left ventricular ejection fraction, by estimation, is 65 to 70%. The left ventricle has normal function. The left ventricle has no regional wall motion abnormalities. Left ventricular diastolic parameters are consistent with Grade I diastolic dysfunction (impaired relaxation).  2. Right ventricular systolic function is normal. The right ventricular  size is normal.  3. Left atrial size was mildly dilated.  4. The mitral valve is normal in structure. Trivial mitral valve regurgitation. No evidence of mitral stenosis.  5. The aortic valve is tricuspid. There is mild calcification of the aortic valve. Aortic valve regurgitation is mild. No aortic stenosis is present. Aortic regurgitation PHT measures 613 msec. Aortic valve area, by VTI measures 1.70 cm. Aortic valve mean gradient measures 3.0 mmHg. Aortic valve Vmax measures 1.15 m/s.  6. The inferior vena cava is normal in size with greater than 50% respiratory variability, suggesting right atrial pressure of 3 mmHg.   07/02/2022 Echocardiogram   1. Left ventricular ejection fraction, by estimation, is 60 to 65%. Left ventricular ejection fraction by 3D volume is 58 %. The left ventricle has normal function. The left ventricle has no regional wall motion abnormalities. Left ventricular diastolic parameters are consistent with Grade I diastolic dysfunction (impaired relaxation). The average left ventricular global longitudinal strain is -20.7 %. The global longitudinal strain is normal.  2. Right ventricular systolic function is normal. The right ventricular size is normal. Tricuspid regurgitation signal is inadequate for assessing PA pressure.  3. Left atrial size was mildly dilated.  4. The mitral valve is normal in structure. Trivial mitral valve  regurgitation. No evidence of mitral stenosis.  5. The aortic valve is grossly normal. Aortic valve regurgitation is mild. No aortic stenosis is present.  6. The inferior vena cava is normal in size with greater than 50% respiratory variability, suggesting right atrial pressure of 3 mmHg.   11/05/2022 Echocardiogram       1. Left ventricular ejection fraction, by estimation, is 55 to 60%. Left ventricular ejection fraction by 3D volume is 59 %. The left ventricle has normal function. The left ventricle has no regional wall motion abnormalities. Left  ventricular diastolic  parameters are consistent with Grade I diastolic dysfunction (impaired relaxation). The average left ventricular global longitudinal strain is -16.8 %. The global longitudinal strain is normal.  2. Right ventricular systolic function is normal. The right ventricular size is normal. There is normal pulmonary artery systolic pressure.  3. The mitral valve is normal in structure. No evidence of mitral valve regurgitation. No evidence of mitral stenosis.  4. The aortic valve is normal in structure. Aortic valve regurgitation is mild. No aortic stenosis is present.  5. The inferior vena cava is normal in size with greater than 50% respiratory variability, suggesting right atrial pressure of 3 mmHg.     11/07/2022 Imaging   CT CHEST ABDOMEN PELVIS W CONTRAST  Result Date: 11/07/2022 CLINICAL DATA:  Cervical cancer. Assess treatment response. On chemotherapy * Tracking Code: BO * EXAM: CT CHEST, ABDOMEN, AND PELVIS WITH CONTRAST TECHNIQUE: Multidetector CT imaging of the chest, abdomen and pelvis was performed following the standard protocol during bolus administration of intravenous contrast. RADIATION DOSE REDUCTION: This exam was performed according to the departmental dose-optimization program which includes automated exposure control, adjustment of the mA and/or kV according to patient size and/or use of iterative reconstruction technique. CONTRAST:  OMNIPAQUE IOHEXOL 300 MG/ML  SOLN COMPARISON:  CT 10/15/2021 and older FINDINGS: CT CHEST FINDINGS Cardiovascular: Right upper chest port. Heart is nonenlarged. No pericardial effusion. The thoracic aorta has a normal course and caliber with scattered vascular calcifications. There is a bovine type aortic arch, a normal variant. Mediastinum/Nodes: Heterogeneous small thyroid gland with a nodular area in the right is unchanged from previous. As stated previously, no specific imaging follow-up. No specific abnormal lymph node  enlargement seen in the axillary regions, hilum or mediastinum. Surgical clips in the left axillary region. Breast implants are noted. Normal caliber thoracic esophagus. Lungs/Pleura: There is some linear opacity lung bases likely scar or atelectasis. No pleural effusion. No consolidation or pneumothorax. There are a few scattered tiny lung nodules identified. Example 3 mm superior segment left lower lobe on series 6, image 49 which is stable. Lingular nodule measuring 4 mm on the prior, today is stable on series 6, image 73. 3 mm nodule along the margin of the minor fissure on the right is also stable. There is a larger ill-defined areas seen right lung on the prior examination series 4, image 63 which has resolved. This may have been infectious or inflammatory. There is a small area of bronchiectasis in this location today on series 6, image 58. No new dominant lung nodule. Musculoskeletal: Diffuse moderate degenerative changes of the spine. CT ABDOMEN PELVIS FINDINGS Hepatobiliary: Fatty liver infiltration. There are some tiny low-attenuation lesion seen in the liver which are too small to completely characterize but unchanged from previous. Simple attention on follow-up for the patient's neoplasm. Example series 2, image 44 anterior to branches of the right hepatic vein. Gallbladder is nondilated. Patent portal vein. Pancreas: Unremarkable.  No pancreatic ductal dilatation or surrounding inflammatory changes. Spleen: Normal in size without focal abnormality. Adrenals/Urinary Tract: The adrenal glands are preserved. No enhancing renal mass. There are some tiny low-attenuation lesion seen along each kidney which are too small to completely characterize likely small cysts. No specific imaging follow-up. Bosniak 2 lesions. Contracted urinary bladder. Stomach/Bowel: Large bowel has extensive left-sided colonic stool. Extensive colonic diverticulosis as well. The large bowel is nondilated. Small bowel is nondilated.  There is a nodular area along the upper aspect of the stomach fold thickening. On series 2, image 45 this measures 18 by 14 mm. This is not well seen previously but on the prior study the stomach is more collapsed. There may have been something in retrospect in this location. There also is some nodular areas along the antrum. Recommend further evaluation to exclude underlying lesion. Vascular/Lymphatic: Normal caliber aorta and IVC with scattered vascular calcifications. No specific abnormal lymph node enlargement identified in the abdomen and pelvis. Reproductive: Status post hysterectomy. No adnexal masses. Other: No free air or free fluid. Musculoskeletal: Moderate degenerative changes of the spine and pelvis. Trace anterolisthesis at L4-5 and L5-S1. 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 IMPRESSION: Stable tiny multiple pulmonary nodules. 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 No developing lymph node enlargement or fluid collection. Colonic diverticulosis of the sigmoid colon with moderate stool. There are nodular masslike areas seen in the stomach including towards the fundus and small area towards the antrum with fold thickening. Hyperplastic polyps are possible. Recommend further workup of the stomach when appropriate to exclude an aggressive process. Electronically Signed   By: Karen Kays M.D.   On: 11/07/2022 13:39   ECHOCARDIOGRAM COMPLETE  Result Date: 11/05/2022    ECHOCARDIOGRAM REPORT   Patient Name:   Vicki Cervantes Date of Exam: 11/05/2022 Medical Rec #:  [id number]      Height:       62.0 in Accession #:    [id number]     Weight:       134.8 lb Date of Birth:  01/31/1941      BSA:          1.617 m Patient Age:    83 years       BP:           144/72 mmHg Patient Gender: F              HR:           69 bpm. Exam Location:  Outpatient Procedure: 2D Echo, 3D Echo, Cardiac Doppler, Color Doppler and Strain Analysis Indications:    chemo  History:        Patient has  prior history of Echocardiogram examinations. Risk                 Factors:Hypertension.  Sonographer:    Mike Gip Referring Phys: [id number] Madalen Gavin IMPRESSIONS  1. Left ventricular ejection fraction, by estimation, is 55 to 60%. Left ventricular ejection fraction by 3D volume is 59 %. The left ventricle has normal function. The left ventricle has no regional wall motion abnormalities. Left ventricular diastolic  parameters are consistent with Grade I diastolic dysfunction (impaired relaxation). The average left ventricular global longitudinal strain is -16.8 %. The global longitudinal strain is normal.  2. Right ventricular systolic function is normal. The right ventricular size is normal. There is normal pulmonary artery systolic pressure.  3. The mitral valve is normal in structure. No evidence of mitral valve  regurgitation. No evidence of mitral stenosis.  4. The aortic valve is normal in structure. Aortic valve regurgitation is mild. No aortic stenosis is present.  5. The inferior vena cava is normal in size with greater than 50% respiratory variability, suggesting right atrial pressure of 3 mmHg. Comparison(s): Prior 07/01/2022: EF 60%, GLS -20.7% Prior 03/2022: GLS-15.3. FINDINGS  Left Ventricle: Left ventricular ejection fraction, by estimation, is 55 to 60%. Left ventricular ejection fraction by 3D volume is 59 %. The left ventricle has normal function. The left ventricle has no regional wall motion abnormalities. The average left ventricular global longitudinal strain is -16.8 %. The global longitudinal strain is normal. The left ventricular internal cavity size was normal in size. There is no left ventricular hypertrophy. Left ventricular diastolic parameters are consistent  with Grade I diastolic dysfunction (impaired relaxation). Right Ventricle: The right ventricular size is normal. No increase in right ventricular wall thickness. Right ventricular systolic function is normal. There is normal  pulmonary artery systolic pressure. The tricuspid regurgitant velocity is 1.79 m/s, and  with an assumed right atrial pressure of 3 mmHg, the estimated right ventricular systolic pressure is 15.8 mmHg. Left Atrium: Left atrial size was normal in size. Right Atrium: Right atrial size was normal in size. Pericardium: There is no evidence of pericardial effusion. Mitral Valve: The mitral valve is normal in structure. No evidence of mitral valve regurgitation. No evidence of mitral valve stenosis. Tricuspid Valve: The tricuspid valve is normal in structure. Tricuspid valve regurgitation is not demonstrated. No evidence of tricuspid stenosis. Aortic Valve: The aortic valve is normal in structure. Aortic valve regurgitation is mild. Aortic regurgitation PHT measures 497 msec. No aortic stenosis is present. Pulmonic Valve: The pulmonic valve was normal in structure. Pulmonic valve regurgitation is mild. No evidence of pulmonic stenosis. Aorta: The aortic root is normal in size and structure. Venous: The inferior vena cava is normal in size with greater than 50% respiratory variability, suggesting right atrial pressure of 3 mmHg. IAS/Shunts: No atrial level shunt detected by color flow Doppler.  LEFT VENTRICLE PLAX 2D LVIDd:         3.80 cm         Diastology LVIDs:         2.50 cm         LV e' medial:    7.51 cm/s LV PW:         1.00 cm         LV E/e' medial:  7.0 LV IVS:        1.00 cm         LV e' lateral:   8.16 cm/s LVOT diam:     1.90 cm         LV E/e' lateral: 6.4 LV SV:         49 LV SV Index:   30              2D LVOT Area:     2.84 cm        Longitudinal                                Strain                                2D Strain GLS  -16.8 % LV Volumes (MOD)  Avg: LV vol d, MOD    62.3 ml A2C:                           3D Volume EF LV vol d, MOD    68.5 ml       LV 3D EF:    Left A4C:                                        ventricul LV vol s, MOD    25.6 ml                    ar A2C:                                         ejection LV vol s, MOD    24.1 ml                    fraction A4C:                                        by 3D LV SV MOD A2C:   36.7 ml                    volume is LV SV MOD A4C:   68.5 ml                    59 %. LV SV MOD BP:    39.9 ml                                 3D Volume EF:                                3D EF:        59 %                                LV EDV:       116 ml                                LV ESV:       48 ml                                LV SV:        68 ml RIGHT VENTRICLE             IVC RV Basal diam:  3.00 cm     IVC diam: 1.40 cm RV S prime:     10.20 cm/s TAPSE (M-mode): 1.9 cm LEFT ATRIUM             Index        RIGHT ATRIUM           Index LA diam:        3.40  cm 2.10 cm/m   RA Area:     11.20 cm LA Vol (A2C):   49.4 ml 30.56 ml/m  RA Volume:   22.50 ml  13.92 ml/m LA Vol (A4C):   28.3 ml 17.51 ml/m LA Biplane Vol: 38.6 ml 23.88 ml/m  AORTIC VALVE LVOT Vmax:   79.80 cm/s LVOT Vmean:  49.400 cm/s LVOT VTI:    0.173 m AI PHT:      497 msec  AORTA Ao Root diam: 3.20 cm MITRAL VALVE               TRICUSPID VALVE MV Area (PHT): 2.84 cm    TR Peak grad:   12.8 mmHg MV Decel Time: 267 msec    TR Vmax:        179.00 cm/s MV E velocity: 52.40 cm/s MV A velocity: 69.80 cm/s  SHUNTS MV E/A ratio:  0.75        Systemic VTI:  0.17 m                            Systemic Diam: 1.90 cm Donato Schultz MD Electronically signed by Donato Schultz MD Signature Date/Time: 11/05/2022/2:11:35 PM    Final       02/17/2023 Echocardiogram   ECHOCARDIOGRAM COMPLETE  Result Date: 02/17/2023    ECHOCARDIOGRAM REPORT   Patient Name:   Vicki Cervantes Date of Exam: 02/17/2023 Medical Rec #:  [id number]      Height:       62.0 in Accession #:    [id number]     Weight:       137.4 lb Date of Birth:  Dec 10, 1940      BSA:          1.630 m Patient Age:    82 years       BP:           143/77 mmHg Patient Gender: F              HR:           71 bpm. Exam Location:  Outpatient Procedure: 2D  Echo, Strain Analysis, Color Doppler and Cardiac Doppler Indications:    Chemo  History:        Patient has prior history of Echocardiogram examinations, most                 recent 11/05/2022. Risk Factors:Hypertension.  Referring Phys: [id number] Arbor Leer IMPRESSIONS  1. Left ventricular ejection fraction, by estimation, is 60 to 65%. The left ventricle has normal function. The left ventricle has no regional wall motion abnormalities. Left ventricular diastolic parameters are consistent with Grade I diastolic dysfunction (impaired relaxation).  2. Right ventricular systolic function is normal. The right ventricular size is normal.  3. The mitral valve is normal in structure. Trivial mitral valve regurgitation.  4. The aortic valve was not well visualized. Aortic valve regurgitation is not visualized.  5. The inferior vena cava is normal in size with greater than 50% respiratory variability, suggesting right atrial pressure of 3 mmHg. FINDINGS  Left Ventricle: Left ventricular ejection fraction, by estimation, is 60 to 65%. The left ventricle has normal function. The left ventricle has no regional wall motion abnormalities. The left ventricular internal cavity size was normal in size. There is  no left ventricular hypertrophy. Left ventricular diastolic parameters are consistent with Grade I diastolic dysfunction (impaired relaxation). Right Ventricle: The right ventricular size is normal. Right  ventricular systolic function is normal. Left Atrium: Left atrial size was normal in size. Right Atrium: Right atrial size was normal in size. Pericardium: There is no evidence of pericardial effusion. Mitral Valve: The mitral valve is normal in structure. Trivial mitral valve regurgitation. Tricuspid Valve: The tricuspid valve is normal in structure. Tricuspid valve regurgitation is not demonstrated. Aortic Valve: The aortic valve was not well visualized. Aortic valve regurgitation is not visualized. Pulmonic Valve: Pulmonic  valve regurgitation is not visualized. Aorta: The aortic root and ascending aorta are structurally normal, with no evidence of dilitation. Venous: The inferior vena cava is normal in size with greater than 50% respiratory variability, suggesting right atrial pressure of 3 mmHg. IAS/Shunts: No atrial level shunt detected by color flow Doppler.  LEFT VENTRICLE PLAX 2D LVIDd:         4.50 cm   Diastology LVIDs:         3.10 cm   LV e' medial:    5.33 cm/s LV PW:         0.90 cm   LV E/e' medial:  13.3 LV IVS:        0.90 cm   LV e' lateral:   7.07 cm/s LVOT diam:     2.00 cm   LV E/e' lateral: 10.1 LV SV:         44 LV SV Index:   27 LVOT Area:     3.14 cm  RIGHT VENTRICLE            IVC RV S prime:     8.59 cm/s  IVC diam: 0.70 cm TAPSE (M-mode): 1.7 cm LEFT ATRIUM             Index        RIGHT ATRIUM           Index LA Vol (A2C):   43.9 ml 26.94 ml/m  RA Area:     11.00 cm LA Vol (A4C):   35.5 ml 21.78 ml/m  RA Volume:   21.50 ml  13.19 ml/m LA Biplane Vol: 41.6 ml 25.53 ml/m  AORTIC VALVE LVOT Vmax:   59.60 cm/s LVOT Vmean:  41.100 cm/s LVOT VTI:    0.139 m MITRAL VALVE MV Area (PHT): 4.57 cm    SHUNTS MV Decel Time: 166 msec    Systemic VTI:  0.14 m MV E velocity: 71.10 cm/s  Systemic Diam: 2.00 cm MV A velocity: 78.40 cm/s MV E/A ratio:  0.91 Carolan Clines Electronically signed by Carolan Clines Signature Date/Time: 02/17/2023/12:11:53 PM    Final       06/27/2023 Echocardiogram       1. Left ventricular ejection fraction, by estimation, is 60 to 65%. The left ventricle has normal function. The left ventricle has no regional wall motion abnormalities. Left ventricular diastolic parameters were normal. The average left ventricular  global longitudinal strain is -16.5 %. The global longitudinal strain is normal.  2. Right ventricular systolic function is normal. The right ventricular size is normal.  3. The mitral valve is abnormal. No evidence of mitral valve regurgitation. No evidence of mitral  stenosis.  4. The aortic valve is tricuspid. There is moderate calcification of the aortic valve. There is moderate thickening of the aortic valve. Aortic valve regurgitation is trivial. Aortic valve sclerosis is present, with no evidence of aortic valve stenosis.  5. The inferior vena cava is normal in size with greater than 50% respiratory variability, suggesting right atrial pressure of 3 mmHg.  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/14/2018 Initial Diagnosis   Metastasis to lymph nodes (HCC)   10/21/2018 - 02/24/2019 Chemotherapy   The patient had palonosetron (ALOXI) injection 0.25 mg, 0.25 mg, Intravenous,  Once, 7 of 7 cycles Administration: 0.25 mg (10/21/2018), 0.25 mg (11/11/2018), 0.25 mg (12/02/2018), 0.25 mg (12/23/2018), 0.25 mg (01/13/2019), 0.25 mg (02/03/2019), 0.25 mg (02/24/2019) CARBOplatin (PARAPLATIN) 310 mg in sodium chloride 0.9 % 250 mL chemo infusion, 310 mg, Intravenous,  Once, 7 of 7 cycles Dose modification: 300 mg (original dose 311.5 mg, Cycle 4, Reason: Dose not tolerated) Administration: 310 mg (10/21/2018), 300 mg (11/11/2018), 300 mg (12/02/2018), 300 mg (12/23/2018), 300 mg (01/13/2019), 300 mg (02/03/2019), 300 mg (02/24/2019) PACLitaxel (TAXOL) 222 mg in sodium chloride 0.9 % 250 mL chemo infusion (> 80mg /m2), 135 mg/m2 = 222 mg, Intravenous,  Once, 7 of 7 cycles Administration: 222 mg (10/21/2018), 222 mg (11/11/2018), 222 mg (12/02/2018), 222 mg (12/23/2018), 222 mg (01/13/2019), 222 mg (02/03/2019), 222 mg (02/24/2019) fosaprepitant (EMEND) 150 mg, dexamethasone (DECADRON) 12 mg in sodium chloride 0.9 % 145 mL IVPB, , Intravenous,  Once, 7 of 7 cycles Administration:  (10/21/2018),  (11/11/2018),  (12/02/2018),  (12/23/2018),  (01/13/2019),  (02/03/2019),  (02/24/2019)  for chemotherapy treatment.    04/02/2022 -  Chemotherapy   Patient is on Treatment Plan : BREAST MAINTENANCE Trastuzumab IV (6) or SQ (600) D1 q21d X 11 Cycles       PHYSICAL EXAMINATION: ECOG PERFORMANCE STATUS: 0 -  Asymptomatic  Vitals:   09/02/23 1152  BP: (!) 154/51  Pulse: 71  Resp: 18  Temp: 97.6 F (36.4 C)  SpO2: 97%   Filed Weights   09/02/23 1152  Weight: 132 lb 3.2 oz (60 kg)    GENERAL:alert, no distress and comfortable LABORATORY DATA:  I have reviewed the data as listed    Component Value Date/Time   NA 138 09/02/2023 1129   NA 139 02/13/2017 1512   K 3.5 09/02/2023 1129   K 4.2 02/13/2017 1512   CL 102 09/02/2023 1129   CO2 27 09/02/2023 1129   CO2 28 02/13/2017 1512   GLUCOSE 127 (H) 09/02/2023 1129   GLUCOSE 118 02/13/2017 1512   BUN 23 09/02/2023 1129   BUN 25.2 02/13/2017 1512   CREATININE 1.18 (H) 09/02/2023 1129   CREATININE 1.20 (H) 11/05/2022 1215   CREATININE 1.2 (H) 02/13/2017 1512   CALCIUM 9.4 09/02/2023 1129   CALCIUM 10.0 02/13/2017 1512   PROT 6.7 09/02/2023 1129   ALBUMIN 4.2 09/02/2023 1129   AST 19 09/02/2023 1129   AST 19 11/05/2022 1215   ALT 12 09/02/2023 1129   ALT 12 11/05/2022 1215   ALKPHOS 78 09/02/2023 1129   BILITOT 0.5 09/02/2023 1129   BILITOT 0.4 11/05/2022 1215   GFRNONAA 46 (L) 09/02/2023 1129   GFRNONAA 45 (L) 11/05/2022 1215   GFRAA 47 (L) 03/28/2020 0933   GFRAA 42 (L) 12/21/2019 0850    No results found for: "SPEP", "UPEP"  Lab Results  Component Value Date   WBC 6.5 09/02/2023   NEUTROABS 4.4 09/02/2023   HGB 11.5 (L) 09/02/2023   HCT 33.1 (L) 09/02/2023   MCV 88.5 09/02/2023   PLT 227 09/02/2023      Chemistry      Component Value Date/Time   NA 138 09/02/2023 1129   NA 139 02/13/2017 1512   K 3.5 09/02/2023 1129   K 4.2 02/13/2017 1512   CL 102 09/02/2023 1129   CO2 27 09/02/2023  1129   CO2 28 02/13/2017 1512   BUN 23 09/02/2023 1129   BUN 25.2 02/13/2017 1512   CREATININE 1.18 (H) 09/02/2023 1129   CREATININE 1.20 (H) 11/05/2022 1215   CREATININE 1.2 (H) 02/13/2017 1512      Component Value Date/Time   CALCIUM 9.4 09/02/2023 1129   CALCIUM 10.0 02/13/2017 1512   ALKPHOS 78 09/02/2023 1129    AST 19 09/02/2023 1129   AST 19 11/05/2022 1215   ALT 12 09/02/2023 1129   ALT 12 11/05/2022 1215   BILITOT 0.5 09/02/2023 1129   BILITOT 0.4 11/05/2022 1215

## 2023-09-02 NOTE — Assessment & Plan Note (Signed)
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

## 2023-09-02 NOTE — Patient Instructions (Signed)
 [facility] - A DEPT OF [facility]   Discharge Instructions: Thank you for choosing [facility] to provide your oncology and hematology care.   If you have a lab appointment with the Cancer Center, please go directly to the Cancer Center and check in at the registration area.   Wear comfortable clothing and clothing appropriate for easy access to any Portacath or PICC line.   We strive to give you quality time with your provider. You may need to reschedule your appointment if you arrive late (15 or more minutes).  Arriving late affects you and other patients whose appointments are after yours.  Also, if you miss three or more appointments without notifying the office, you may be dismissed from the clinic at the provider's discretion.      For prescription refill requests, have your pharmacy contact our office and allow 72 hours for refills to be completed.    Today you received the following chemotherapy and/or immunotherapy agents: Trastuzumab (Kanjinti)      To help prevent nausea and vomiting after your treatment, we encourage you to take your nausea medication as directed.  BELOW ARE SYMPTOMS THAT SHOULD BE REPORTED IMMEDIATELY: *FEVER GREATER THAN 100.4 F (38 C) OR HIGHER *CHILLS OR SWEATING *NAUSEA AND VOMITING THAT IS NOT CONTROLLED WITH YOUR NAUSEA MEDICATION *UNUSUAL SHORTNESS OF BREATH *UNUSUAL BRUISING OR BLEEDING *URINARY PROBLEMS (pain or burning when urinating, or frequent urination) *BOWEL PROBLEMS (unusual diarrhea, constipation, pain near the anus) TENDERNESS IN MOUTH AND THROAT WITH OR WITHOUT PRESENCE OF ULCERS (sore throat, sores in mouth, or a toothache) UNUSUAL RASH, SWELLING OR PAIN  UNUSUAL VAGINAL DISCHARGE OR ITCHING   Items with * indicate a potential emergency and should be followed up as soon as possible or go to the Emergency Department if any problems should occur.  Please show the CHEMOTHERAPY ALERT CARD or  IMMUNOTHERAPY ALERT CARD at check-in to the Emergency Department and triage nurse.  Should you have questions after your visit or need to cancel or reschedule your appointment, please contact [facility] - A DEPT [facility]  Dept: [phone]  and follow the prompts.  Office hours are 8:00 a.m. to 4:30 p.m. Monday - Friday. Please note that voicemails left after 4:00 p.m. may not be returned until the following business day.  We are closed weekends and major holidays. You have access to a nurse at all times for urgent questions. Please call the main number to the clinic Dept: [phone] and follow the prompts.   For any non-urgent questions, you may also contact your provider using MyChart. We now offer e-Visits for anyone 33 and older to request care online for non-urgent symptoms. For details visit [email or web address].   Also download the MyChart app! Go to the app store, search "MyChart", open the app, select [facility], and log in with your MyChart username and password.

## 2023-09-11 ENCOUNTER — Telehealth: Payer: Self-pay

## 2023-09-11 NOTE — Telephone Encounter (Signed)
 Returned her call and reviewed upcoming appts. She verbalized understanding.

## 2023-09-23 ENCOUNTER — Ambulatory Visit: Payer: Medicare HMO

## 2023-09-24 ENCOUNTER — Encounter: Payer: Self-pay | Admitting: Hematology and Oncology

## 2023-09-24 ENCOUNTER — Inpatient Hospital Stay: Payer: Medicare HMO | Attending: Gynecologic Oncology

## 2023-09-24 VITALS — BP 153/59 | HR 69 | Temp 97.8°F | Resp 16 | Wt 131.0 lb

## 2023-09-24 DIAGNOSIS — H353131 Nonexudative age-related macular degeneration, bilateral, early dry stage: Secondary | ICD-10-CM | POA: Diagnosis not present

## 2023-09-24 DIAGNOSIS — C773 Secondary and unspecified malignant neoplasm of axilla and upper limb lymph nodes: Secondary | ICD-10-CM | POA: Diagnosis not present

## 2023-09-24 DIAGNOSIS — Z5112 Encounter for antineoplastic immunotherapy: Secondary | ICD-10-CM | POA: Diagnosis not present

## 2023-09-24 DIAGNOSIS — C541 Malignant neoplasm of endometrium: Secondary | ICD-10-CM

## 2023-09-24 DIAGNOSIS — Z8669 Personal history of other diseases of the nervous system and sense organs: Secondary | ICD-10-CM | POA: Diagnosis not present

## 2023-09-24 DIAGNOSIS — H35721 Serous detachment of retinal pigment epithelium, right eye: Secondary | ICD-10-CM | POA: Diagnosis not present

## 2023-09-24 DIAGNOSIS — Z8542 Personal history of malignant neoplasm of other parts of uterus: Secondary | ICD-10-CM | POA: Insufficient documentation

## 2023-09-24 DIAGNOSIS — H35372 Puckering of macula, left eye: Secondary | ICD-10-CM | POA: Diagnosis not present

## 2023-09-24 DIAGNOSIS — Z7189 Other specified counseling: Secondary | ICD-10-CM

## 2023-09-24 DIAGNOSIS — H35363 Drusen (degenerative) of macula, bilateral: Secondary | ICD-10-CM | POA: Diagnosis not present

## 2023-09-24 MED ORDER — TRASTUZUMAB-ANNS CHEMO 150 MG IV SOLR
6.0000 mg/kg | Freq: Once | INTRAVENOUS | Status: AC
  Administered 2023-09-24: 357 mg via INTRAVENOUS
  Filled 2023-09-24: qty 17

## 2023-09-24 MED ORDER — SODIUM CHLORIDE 0.9% FLUSH
10.0000 mL | INTRAVENOUS | Status: DC | PRN
  Administered 2023-09-24: 10 mL

## 2023-09-24 MED ORDER — HEPARIN SOD (PORK) LOCK FLUSH 100 UNIT/ML IV SOLN
500.0000 [IU] | Freq: Once | INTRAVENOUS | Status: AC | PRN
  Administered 2023-09-24: 500 [IU]

## 2023-09-24 MED ORDER — SODIUM CHLORIDE 0.9 % IV SOLN: Freq: Once | INTRAVENOUS | Status: AC

## 2023-09-24 NOTE — Patient Instructions (Signed)

## 2023-09-26 DIAGNOSIS — H353131 Nonexudative age-related macular degeneration, bilateral, early dry stage: Secondary | ICD-10-CM | POA: Diagnosis not present

## 2023-09-26 DIAGNOSIS — H35721 Serous detachment of retinal pigment epithelium, right eye: Secondary | ICD-10-CM | POA: Diagnosis not present

## 2023-09-26 DIAGNOSIS — H35363 Drusen (degenerative) of macula, bilateral: Secondary | ICD-10-CM | POA: Diagnosis not present

## 2023-09-26 DIAGNOSIS — H35372 Puckering of macula, left eye: Secondary | ICD-10-CM | POA: Diagnosis not present

## 2023-09-30 DIAGNOSIS — E785 Hyperlipidemia, unspecified: Secondary | ICD-10-CM | POA: Diagnosis not present

## 2023-09-30 DIAGNOSIS — E039 Hypothyroidism, unspecified: Secondary | ICD-10-CM | POA: Diagnosis not present

## 2023-10-14 ENCOUNTER — Inpatient Hospital Stay: Payer: Medicare HMO

## 2023-10-14 ENCOUNTER — Inpatient Hospital Stay: Payer: Medicare HMO | Attending: Gynecologic Oncology

## 2023-10-14 ENCOUNTER — Encounter: Payer: Self-pay | Admitting: Hematology and Oncology

## 2023-10-14 VITALS — BP 179/76 | HR 68 | Temp 98.0°F | Resp 16

## 2023-10-14 DIAGNOSIS — Z7189 Other specified counseling: Secondary | ICD-10-CM

## 2023-10-14 DIAGNOSIS — C773 Secondary and unspecified malignant neoplasm of axilla and upper limb lymph nodes: Secondary | ICD-10-CM | POA: Diagnosis not present

## 2023-10-14 DIAGNOSIS — Z5112 Encounter for antineoplastic immunotherapy: Secondary | ICD-10-CM | POA: Insufficient documentation

## 2023-10-14 DIAGNOSIS — Z8542 Personal history of malignant neoplasm of other parts of uterus: Secondary | ICD-10-CM | POA: Diagnosis not present

## 2023-10-14 DIAGNOSIS — C541 Malignant neoplasm of endometrium: Secondary | ICD-10-CM

## 2023-10-14 DIAGNOSIS — N183 Chronic kidney disease, stage 3 unspecified: Secondary | ICD-10-CM

## 2023-10-14 LAB — CBC WITH DIFFERENTIAL/PLATELET
Abs Immature Granulocytes: 0.03 10*3/uL (ref 0.00–0.07)
Basophils Absolute: 0.1 10*3/uL (ref 0.0–0.1)
Basophils Relative: 1 %
Eosinophils Absolute: 0.4 10*3/uL (ref 0.0–0.5)
Eosinophils Relative: 6 %
HCT: 30.2 % — ABNORMAL LOW (ref 36.0–46.0)
Hemoglobin: 10.7 g/dL — ABNORMAL LOW (ref 12.0–15.0)
Immature Granulocytes: 1 %
Lymphocytes Relative: 33 %
Lymphs Abs: 2 10*3/uL (ref 0.7–4.0)
MCH: 30.6 pg (ref 26.0–34.0)
MCHC: 35.4 g/dL (ref 30.0–36.0)
MCV: 86.3 fL (ref 80.0–100.0)
Monocytes Absolute: 0.5 10*3/uL (ref 0.1–1.0)
Monocytes Relative: 8 %
Neutro Abs: 3.1 10*3/uL (ref 1.7–7.7)
Neutrophils Relative %: 51 %
Platelets: 241 10*3/uL (ref 150–400)
RBC: 3.5 MIL/uL — ABNORMAL LOW (ref 3.87–5.11)
RDW: 12 % (ref 11.5–15.5)
WBC: 6.1 10*3/uL (ref 4.0–10.5)
nRBC: 0 % (ref 0.0–0.2)

## 2023-10-14 LAB — COMPREHENSIVE METABOLIC PANEL WITH GFR
ALT: 14 U/L (ref 0–44)
AST: 18 U/L (ref 15–41)
Albumin: 4.2 g/dL (ref 3.5–5.0)
Alkaline Phosphatase: 63 U/L (ref 38–126)
Anion gap: 9 (ref 5–15)
BUN: 26 mg/dL — ABNORMAL HIGH (ref 8–23)
CO2: 28 mmol/L (ref 22–32)
Calcium: 8.6 mg/dL — ABNORMAL LOW (ref 8.9–10.3)
Chloride: 102 mmol/L (ref 98–111)
Creatinine, Ser: 1.24 mg/dL — ABNORMAL HIGH (ref 0.44–1.00)
GFR, Estimated: 43 mL/min — ABNORMAL LOW (ref >60)
Glucose, Bld: 93 mg/dL (ref 70–99)
Potassium: 3.6 mmol/L (ref 3.5–5.1)
Sodium: 139 mmol/L (ref 135–145)
Total Bilirubin: 0.4 mg/dL (ref 0.0–1.2)
Total Protein: 6.6 g/dL (ref 6.5–8.1)

## 2023-10-14 MED ORDER — TRASTUZUMAB-ANNS CHEMO 150 MG IV SOLR
6.0000 mg/kg | Freq: Once | INTRAVENOUS | Status: AC
  Administered 2023-10-14: 357 mg via INTRAVENOUS
  Filled 2023-10-14: qty 17

## 2023-10-14 MED ORDER — HEPARIN SOD (PORK) LOCK FLUSH 100 UNIT/ML IV SOLN
500.0000 [IU] | Freq: Once | INTRAVENOUS | Status: AC | PRN
Start: 2023-10-14 — End: 2023-10-14
  Administered 2023-10-14: 500 [IU]

## 2023-10-14 MED ORDER — SODIUM CHLORIDE 0.9% FLUSH
10.0000 mL | INTRAVENOUS | Status: DC | PRN
  Administered 2023-10-14: 10 mL

## 2023-10-14 MED ORDER — SODIUM CHLORIDE 0.9% FLUSH
10.0000 mL | Freq: Once | INTRAVENOUS | Status: AC
Start: 2023-10-14 — End: 2023-10-14
  Administered 2023-10-14: 10 mL

## 2023-10-14 MED ORDER — SODIUM CHLORIDE 0.9 % IV SOLN
Freq: Once | INTRAVENOUS | Status: AC
Start: 2023-10-14 — End: 2023-10-14

## 2023-10-14 NOTE — Patient Instructions (Signed)

## 2023-10-15 ENCOUNTER — Ambulatory Visit (HOSPITAL_COMMUNITY)
Admission: RE | Admit: 2023-10-15 | Discharge: 2023-10-15 | Disposition: A | Discharge status: Home / Self Care | Payer: Medicare HMO | Source: Ambulatory Visit | Attending: Hematology and Oncology | Admitting: Hematology and Oncology

## 2023-10-15 DIAGNOSIS — C541 Malignant neoplasm of endometrium: Secondary | ICD-10-CM | POA: Insufficient documentation

## 2023-10-15 DIAGNOSIS — C773 Secondary and unspecified malignant neoplasm of axilla and upper limb lymph nodes: Secondary | ICD-10-CM | POA: Diagnosis not present

## 2023-10-15 DIAGNOSIS — I700 Atherosclerosis of aorta: Secondary | ICD-10-CM | POA: Diagnosis not present

## 2023-10-15 DIAGNOSIS — R918 Other nonspecific abnormal finding of lung field: Secondary | ICD-10-CM | POA: Diagnosis not present

## 2023-10-15 DIAGNOSIS — K5730 Diverticulosis of large intestine without perforation or abscess without bleeding: Secondary | ICD-10-CM | POA: Diagnosis not present

## 2023-10-15 MED ORDER — IOHEXOL 300 MG/ML  SOLN
80.0000 mL | Freq: Once | INTRAMUSCULAR | Status: AC | PRN
  Administered 2023-10-15: 80 mL via INTRAVENOUS

## 2023-10-16 ENCOUNTER — Ambulatory Visit (HOSPITAL_COMMUNITY): Payer: Medicare HMO

## 2023-11-04 ENCOUNTER — Ambulatory Visit: Payer: Medicare HMO

## 2023-11-04 ENCOUNTER — Ambulatory Visit: Payer: Medicare HMO | Admitting: Hematology and Oncology

## 2023-11-05 ENCOUNTER — Encounter: Payer: Self-pay | Admitting: Hematology and Oncology

## 2023-11-05 ENCOUNTER — Other Ambulatory Visit: Payer: Self-pay | Admitting: Hematology and Oncology

## 2023-11-05 DIAGNOSIS — L570 Actinic keratosis: Secondary | ICD-10-CM | POA: Diagnosis not present

## 2023-11-05 DIAGNOSIS — D0439 Carcinoma in situ of skin of other parts of face: Secondary | ICD-10-CM | POA: Diagnosis not present

## 2023-11-05 DIAGNOSIS — D485 Neoplasm of uncertain behavior of skin: Secondary | ICD-10-CM | POA: Diagnosis not present

## 2023-11-06 ENCOUNTER — Inpatient Hospital Stay (HOSPITAL_BASED_OUTPATIENT_CLINIC_OR_DEPARTMENT_OTHER): Payer: Medicare HMO | Admitting: Hematology and Oncology

## 2023-11-06 ENCOUNTER — Encounter: Payer: Self-pay | Admitting: Hematology and Oncology

## 2023-11-06 ENCOUNTER — Inpatient Hospital Stay: Payer: Medicare HMO

## 2023-11-06 VITALS — BP 176/60 | HR 72 | Temp 98.8°F | Resp 18 | Ht 62.0 in | Wt 131.0 lb

## 2023-11-06 DIAGNOSIS — C773 Secondary and unspecified malignant neoplasm of axilla and upper limb lymph nodes: Secondary | ICD-10-CM

## 2023-11-06 DIAGNOSIS — N183 Chronic kidney disease, stage 3 unspecified: Secondary | ICD-10-CM | POA: Diagnosis not present

## 2023-11-06 DIAGNOSIS — Z8542 Personal history of malignant neoplasm of other parts of uterus: Secondary | ICD-10-CM | POA: Diagnosis not present

## 2023-11-06 DIAGNOSIS — C541 Malignant neoplasm of endometrium: Secondary | ICD-10-CM

## 2023-11-06 DIAGNOSIS — Z7189 Other specified counseling: Secondary | ICD-10-CM

## 2023-11-06 DIAGNOSIS — Z5112 Encounter for antineoplastic immunotherapy: Secondary | ICD-10-CM | POA: Diagnosis not present

## 2023-11-06 DIAGNOSIS — I10 Essential (primary) hypertension: Secondary | ICD-10-CM | POA: Diagnosis not present

## 2023-11-06 MED ORDER — HEPARIN SOD (PORK) LOCK FLUSH 100 UNIT/ML IV SOLN: 500.0000 [IU] | Freq: Once | INTRAVENOUS | Status: DC | PRN

## 2023-11-06 MED ORDER — SODIUM CHLORIDE 0.9 % IV SOLN: Freq: Once | INTRAVENOUS | Status: AC

## 2023-11-06 MED ORDER — ESTRADIOL 0.1 MG/GM VA CREA: 1.0000 | TOPICAL_CREAM | VAGINAL | 12 refills | Status: AC

## 2023-11-06 MED ORDER — TRASTUZUMAB-ANNS CHEMO 150 MG IV SOLR
6.0000 mg/kg | Freq: Once | INTRAVENOUS | Status: AC
  Administered 2023-11-06: 357 mg via INTRAVENOUS
  Filled 2023-11-06: qty 17

## 2023-11-06 MED ORDER — SODIUM CHLORIDE 0.9% FLUSH: 10.0000 mL | INTRAVENOUS | Status: DC | PRN

## 2023-11-06 NOTE — Assessment & Plan Note (Addendum)
Her blood pressure is elevated today but generally normal at home Her recent echocardiogram showed diastolic dysfunction She will continue medical management

## 2023-11-06 NOTE — Patient Instructions (Signed)

## 2023-11-06 NOTE — Assessment & Plan Note (Addendum)
 The patient was originally diagnosed with uterine cancer in 2018 status post surgery, on background history of left breast cancer.  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

## 2023-11-06 NOTE — Assessment & Plan Note (Addendum)
 Renal function is stable. Monitor closely every 3 mo

## 2023-11-06 NOTE — Progress Notes (Signed)
 [facility] OFFICE PROGRESS NOTE  Patient Care Team: [name], [name]  Assessment & Plan Endometrial cancer [facility]) The patient was originally diagnosed with uterine cancer in 2018 status post surgery, on background history of left breast cancer.  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 Monitor closely every 3 mo Essential hypertension Her blood pressure is elevated today but generally normal at home Her recent echocardiogram showed diastolic dysfunction She will continue medical management   No orders of the defined types were placed in this encounter.    [name], MD  INTERVAL HISTORY: she returns for treatment follow-up Complications related to previous cycle of chemotherapy included elevated BP  PHYSICAL EXAMINATION: ECOG PERFORMANCE STATUS: 0 - Asymptomatic  Vitals:   11/06/23 1021  BP: (!) 176/60  Pulse: 72  Resp: 18  Temp: 98.8 F (37.1 C)  SpO2: 94%   Filed Weights   11/06/23 1021  Weight: 131 lb (59.4 kg)    Relevant data reviewed during this visit included CBC, CMP, CT imaging  from April 2025

## 2023-11-17 ENCOUNTER — Ambulatory Visit (HOSPITAL_COMMUNITY)

## 2023-11-24 ENCOUNTER — Ambulatory Visit (HOSPITAL_COMMUNITY)
Admission: RE | Admit: 2023-11-24 | Discharge: 2023-11-24 | Disposition: A | Discharge status: Home / Self Care | Source: Ambulatory Visit | Attending: Hematology and Oncology | Admitting: Hematology and Oncology

## 2023-11-24 DIAGNOSIS — Z0189 Encounter for other specified special examinations: Secondary | ICD-10-CM

## 2023-11-24 DIAGNOSIS — C773 Secondary and unspecified malignant neoplasm of axilla and upper limb lymph nodes: Secondary | ICD-10-CM | POA: Diagnosis not present

## 2023-11-24 DIAGNOSIS — I351 Nonrheumatic aortic (valve) insufficiency: Secondary | ICD-10-CM | POA: Insufficient documentation

## 2023-11-24 DIAGNOSIS — R079 Chest pain, unspecified: Secondary | ICD-10-CM | POA: Insufficient documentation

## 2023-11-24 DIAGNOSIS — I10 Essential (primary) hypertension: Secondary | ICD-10-CM | POA: Diagnosis not present

## 2023-11-24 DIAGNOSIS — E785 Hyperlipidemia, unspecified: Secondary | ICD-10-CM | POA: Diagnosis not present

## 2023-11-24 DIAGNOSIS — Z01818 Encounter for other preprocedural examination: Secondary | ICD-10-CM | POA: Diagnosis not present

## 2023-11-24 DIAGNOSIS — C541 Malignant neoplasm of endometrium: Secondary | ICD-10-CM | POA: Insufficient documentation

## 2023-11-24 LAB — ECHOCARDIOGRAM COMPLETE
Area-P 1/2: 3.31 cm2
Calc EF: 58.4 %
P 1/2 time: 488 ms
S' Lateral: 2.8 cm
Single Plane A2C EF: 57.2 %
Single Plane A4C EF: 62.5 %

## 2023-11-24 NOTE — Progress Notes (Signed)
  Echocardiogram 2D Echocardiogram has been performed.  Vicki Cervantes 11/24/2023, 11:05 AM

## 2023-11-25 ENCOUNTER — Ambulatory Visit: Admitting: Hematology and Oncology

## 2023-11-25 ENCOUNTER — Inpatient Hospital Stay: Attending: Gynecologic Oncology

## 2023-11-25 VITALS — BP 160/67 | HR 66 | Temp 98.2°F | Resp 17 | Wt 130.2 lb

## 2023-11-25 DIAGNOSIS — Z8542 Personal history of malignant neoplasm of other parts of uterus: Secondary | ICD-10-CM | POA: Diagnosis not present

## 2023-11-25 DIAGNOSIS — C773 Secondary and unspecified malignant neoplasm of axilla and upper limb lymph nodes: Secondary | ICD-10-CM | POA: Insufficient documentation

## 2023-11-25 DIAGNOSIS — Z5112 Encounter for antineoplastic immunotherapy: Secondary | ICD-10-CM | POA: Insufficient documentation

## 2023-11-25 DIAGNOSIS — Z7189 Other specified counseling: Secondary | ICD-10-CM

## 2023-11-25 DIAGNOSIS — C541 Malignant neoplasm of endometrium: Secondary | ICD-10-CM

## 2023-11-25 MED ORDER — SODIUM CHLORIDE 0.9 % IV SOLN: Freq: Once | INTRAVENOUS | Status: AC

## 2023-11-25 MED ORDER — SODIUM CHLORIDE 0.9 % IV SOLN
6.0000 mg/kg | Freq: Once | INTRAVENOUS | Status: AC
  Administered 2023-11-25: 357 mg via INTRAVENOUS
  Filled 2023-11-25: qty 17

## 2023-11-25 MED ORDER — HEPARIN SOD (PORK) LOCK FLUSH 100 UNIT/ML IV SOLN
500.0000 [IU] | Freq: Once | INTRAVENOUS | Status: AC | PRN
  Administered 2023-11-25: 500 [IU]

## 2023-11-25 MED ORDER — SODIUM CHLORIDE 0.9% FLUSH
10.0000 mL | INTRAVENOUS | Status: DC | PRN
  Administered 2023-11-25: 10 mL

## 2023-11-25 NOTE — Patient Instructions (Signed)
 [facility]  Discharge Instructions: Thank you for choosing [facility] to provide your oncology and hematology care.   If you have a lab appointment with the Cancer Center, please go directly to the Cancer Center and check in at the registration area.   Wear comfortable clothing and clothing appropriate for easy access to any Portacath or PICC line.   We strive to give you quality time with your provider. You may need to reschedule your appointment if you arrive late (15 or more minutes).  Arriving late affects you and other patients whose appointments are after yours.  Also, if you miss three or more appointments without notifying the office, you may be dismissed from the clinic at the provider's discretion.      For prescription refill requests, have your pharmacy contact our office and allow 72 hours for refills to be completed.    Today you received the following chemotherapy and/or immunotherapy agents: trastuzumab-anns      To help prevent nausea and vomiting after your treatment, we encourage you to take your nausea medication as directed.  BELOW ARE SYMPTOMS THAT SHOULD BE REPORTED IMMEDIATELY: *FEVER GREATER THAN 100.4 F (38 C) OR HIGHER *CHILLS OR SWEATING *NAUSEA AND VOMITING THAT IS NOT CONTROLLED WITH YOUR NAUSEA MEDICATION *UNUSUAL SHORTNESS OF BREATH *UNUSUAL BRUISING OR BLEEDING *URINARY PROBLEMS (pain or burning when urinating, or frequent urination) *BOWEL PROBLEMS (unusual diarrhea, constipation, pain near the anus) TENDERNESS IN MOUTH AND THROAT WITH OR WITHOUT PRESENCE OF ULCERS (sore throat, sores in mouth, or a toothache) UNUSUAL RASH, SWELLING OR PAIN  UNUSUAL VAGINAL DISCHARGE OR ITCHING   Items with * indicate a potential emergency and should be followed up as soon as possible or go to the Emergency Department if any problems should occur.  Please show the CHEMOTHERAPY ALERT CARD or  IMMUNOTHERAPY ALERT CARD at check-in to the Emergency Department and triage nurse.  Should you have questions after your visit or need to cancel or reschedule your appointment, please contact [facility]  Dept: [phone]  and follow the prompts.  Office hours are 8:00 a.m. to 4:30 p.m. Monday - Friday. Please note that voicemails left after 4:00 p.m. may not be returned until the following business day.  We are closed weekends and major holidays. You have access to a nurse at all times for urgent questions. Please call the main number to the clinic Dept: [phone] and follow the prompts.   For any non-urgent questions, you may also contact your provider using MyChart. We now offer e-Visits for anyone 73 and older to request care online for non-urgent symptoms. For details visit [email or web address].   Also download the MyChart app! Go to the app store, search "MyChart", open the app, select [facility], and log in with your MyChart username and password.

## 2023-12-10 ENCOUNTER — Telehealth: Payer: Self-pay

## 2023-12-10 ENCOUNTER — Other Ambulatory Visit: Payer: Self-pay | Admitting: Hematology and Oncology

## 2023-12-10 NOTE — Telephone Encounter (Signed)
 Appt is canceled and scheduling msg sent

## 2023-12-10 NOTE — Telephone Encounter (Signed)
 Called and left a message appt canceled and Dr. Marton Sleeper sent a scheduling message.

## 2023-12-10 NOTE — Telephone Encounter (Signed)
 She called and left a message requesting to move 6/3 appts to 6/10 please. She has a graduation that she wants to attend.

## 2023-12-16 ENCOUNTER — Ambulatory Visit

## 2023-12-16 ENCOUNTER — Ambulatory Visit: Admitting: Hematology and Oncology

## 2023-12-23 ENCOUNTER — Inpatient Hospital Stay: Attending: Gynecologic Oncology

## 2023-12-23 ENCOUNTER — Inpatient Hospital Stay: Admitting: Hematology and Oncology

## 2023-12-23 ENCOUNTER — Encounter: Payer: Self-pay | Admitting: Hematology and Oncology

## 2023-12-23 VITALS — BP 179/82 | HR 66 | Temp 98.3°F | Resp 18 | Ht 62.0 in | Wt 131.0 lb

## 2023-12-23 DIAGNOSIS — C773 Secondary and unspecified malignant neoplasm of axilla and upper limb lymph nodes: Secondary | ICD-10-CM

## 2023-12-23 DIAGNOSIS — Z8542 Personal history of malignant neoplasm of other parts of uterus: Secondary | ICD-10-CM | POA: Diagnosis not present

## 2023-12-23 DIAGNOSIS — Z7189 Other specified counseling: Secondary | ICD-10-CM

## 2023-12-23 DIAGNOSIS — C541 Malignant neoplasm of endometrium: Secondary | ICD-10-CM

## 2023-12-23 DIAGNOSIS — Z5112 Encounter for antineoplastic immunotherapy: Secondary | ICD-10-CM | POA: Diagnosis not present

## 2023-12-23 DIAGNOSIS — Z853 Personal history of malignant neoplasm of breast: Secondary | ICD-10-CM | POA: Diagnosis not present

## 2023-12-23 MED ORDER — SODIUM CHLORIDE 0.9% FLUSH
10.0000 mL | INTRAVENOUS | Status: DC | PRN
  Administered 2023-12-23: 10 mL

## 2023-12-23 MED ORDER — HEPARIN SOD (PORK) LOCK FLUSH 100 UNIT/ML IV SOLN
500.0000 [IU] | Freq: Once | INTRAVENOUS | Status: AC | PRN
  Administered 2023-12-23: 500 [IU]

## 2023-12-23 MED ORDER — TRASTUZUMAB-ANNS CHEMO 150 MG IV SOLR
6.0000 mg/kg | Freq: Once | INTRAVENOUS | Status: AC
  Administered 2023-12-23: 357 mg via INTRAVENOUS
  Filled 2023-12-23: qty 17

## 2023-12-23 MED ORDER — SODIUM CHLORIDE 0.9 % IV SOLN: Freq: Once | INTRAVENOUS | Status: AC

## 2023-12-23 NOTE — Progress Notes (Signed)
 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    Vicki Cervantes, Vicki  INTERVAL HISTORY: she returns for treatment follow-up Complications related to previous cycle of chemotherapy included none  PHYSICAL EXAMINATION: ECOG PERFORMANCE STATUS: 0 - Asymptomatic  No results found for: "CAN125"    Latest Ref Rng & Units 10/14/2023    8:16 AM 09/02/2023   11:29 AM 08/13/2023    9:12 AM  CBC  WBC 4.0 - 10.5 K/uL 6.1  6.5  6.2   Hemoglobin 12.0 - 15.0 g/dL 16.1  09.6  04.5   Hematocrit 36.0 - 46.0 % 30.2  33.1  31.9   Platelets 150 - 400 K/uL 241  227  239       Chemistry      Component Value Date/Time   NA 139 10/14/2023 0816   NA 139 02/13/2017 1512   K 3.6 10/14/2023 0816   K 4.2 02/13/2017 1512   CL 102 10/14/2023 0816   CO2 28 10/14/2023 0816   CO2 28 02/13/2017 1512   BUN 26 (H) 10/14/2023 0816   BUN 25.2 02/13/2017 1512   CREATININE 1.24 (H)  10/14/2023 0816   CREATININE 1.20 (H) 11/05/2022 1215   CREATININE 1.2 (H) 02/13/2017 1512      Component Value Date/Time   CALCIUM  8.6 (L) 10/14/2023 0816   CALCIUM  10.0 02/13/2017 1512   ALKPHOS 63 10/14/2023 0816   AST 18 10/14/2023 0816   AST 19 11/05/2022 1215   ALT 14 10/14/2023 0816   ALT 12 11/05/2022 1215   BILITOT 0.4 10/14/2023 0816   BILITOT 0.4 11/05/2022 1215       Vitals:   12/23/23 0850  BP: (!) 179/82  Pulse: 66  Resp: 18  Temp: 98.3 F (36.8 C)  SpO2: 95%   Filed Weights   12/23/23 0850  Weight: 131 lb (59.4 kg)   Other relevant data reviewed during this visit included echocardiogram from May 2025

## 2023-12-23 NOTE — Assessment & Plan Note (Addendum)
 The patient was originally diagnosed with uterine cancer in 2018 status post surgery, on background history of left breast cancer.  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

## 2023-12-24 DIAGNOSIS — L570 Actinic keratosis: Secondary | ICD-10-CM | POA: Diagnosis not present

## 2024-01-06 ENCOUNTER — Other Ambulatory Visit: Payer: Self-pay | Admitting: Hematology and Oncology

## 2024-01-06 DIAGNOSIS — C541 Malignant neoplasm of endometrium: Secondary | ICD-10-CM

## 2024-01-06 DIAGNOSIS — C773 Secondary and unspecified malignant neoplasm of axilla and upper limb lymph nodes: Secondary | ICD-10-CM

## 2024-01-06 DIAGNOSIS — Z7189 Other specified counseling: Secondary | ICD-10-CM

## 2024-01-13 ENCOUNTER — Inpatient Hospital Stay: Attending: Gynecologic Oncology

## 2024-01-13 VITALS — BP 166/68 | HR 65 | Temp 98.2°F | Resp 18

## 2024-01-13 DIAGNOSIS — Z853 Personal history of malignant neoplasm of breast: Secondary | ICD-10-CM | POA: Insufficient documentation

## 2024-01-13 DIAGNOSIS — C773 Secondary and unspecified malignant neoplasm of axilla and upper limb lymph nodes: Secondary | ICD-10-CM | POA: Insufficient documentation

## 2024-01-13 DIAGNOSIS — Z8542 Personal history of malignant neoplasm of other parts of uterus: Secondary | ICD-10-CM | POA: Diagnosis not present

## 2024-01-13 DIAGNOSIS — C541 Malignant neoplasm of endometrium: Secondary | ICD-10-CM

## 2024-01-13 DIAGNOSIS — Z5112 Encounter for antineoplastic immunotherapy: Secondary | ICD-10-CM | POA: Diagnosis not present

## 2024-01-13 DIAGNOSIS — Z7189 Other specified counseling: Secondary | ICD-10-CM

## 2024-01-13 MED ORDER — HEPARIN SOD (PORK) LOCK FLUSH 100 UNIT/ML IV SOLN
500.0000 [IU] | Freq: Once | INTRAVENOUS | Status: AC | PRN
  Administered 2024-01-13: 500 [IU]

## 2024-01-13 MED ORDER — TRASTUZUMAB-ANNS CHEMO 150 MG IV SOLR
6.0000 mg/kg | Freq: Once | INTRAVENOUS | Status: AC
  Administered 2024-01-13: 357 mg via INTRAVENOUS
  Filled 2024-01-13: qty 17

## 2024-01-13 MED ORDER — SODIUM CHLORIDE 0.9 % IV SOLN: Freq: Once | INTRAVENOUS | Status: AC

## 2024-01-13 MED ORDER — SODIUM CHLORIDE 0.9% FLUSH
10.0000 mL | INTRAVENOUS | Status: DC | PRN
  Administered 2024-01-13: 10 mL

## 2024-01-13 NOTE — Patient Instructions (Signed)
 [facility]  Discharge Instructions: Thank you for choosing [facility] to provide your oncology and hematology care.   If you have a lab appointment with the Cancer Center, please go directly to the Cancer Center and check in at the registration area.   Wear comfortable clothing and clothing appropriate for easy access to any Portacath or PICC line.   We strive to give you quality time with your provider. You may need to reschedule your appointment if you arrive late (15 or more minutes).  Arriving late affects you and other patients whose appointments are after yours.  Also, if you miss three or more appointments without notifying the office, you may be dismissed from the clinic at the provider's discretion.      For prescription refill requests, have your pharmacy contact our office and allow 72 hours for refills to be completed.    Today you received the following chemotherapy and/or immunotherapy agents: trastuzumab-anns      To help prevent nausea and vomiting after your treatment, we encourage you to take your nausea medication as directed.  BELOW ARE SYMPTOMS THAT SHOULD BE REPORTED IMMEDIATELY: *FEVER GREATER THAN 100.4 F (38 C) OR HIGHER *CHILLS OR SWEATING *NAUSEA AND VOMITING THAT IS NOT CONTROLLED WITH YOUR NAUSEA MEDICATION *UNUSUAL SHORTNESS OF BREATH *UNUSUAL BRUISING OR BLEEDING *URINARY PROBLEMS (pain or burning when urinating, or frequent urination) *BOWEL PROBLEMS (unusual diarrhea, constipation, pain near the anus) TENDERNESS IN MOUTH AND THROAT WITH OR WITHOUT PRESENCE OF ULCERS (sore throat, sores in mouth, or a toothache) UNUSUAL RASH, SWELLING OR PAIN  UNUSUAL VAGINAL DISCHARGE OR ITCHING   Items with * indicate a potential emergency and should be followed up as soon as possible or go to the Emergency Department if any problems should occur.  Please show the CHEMOTHERAPY ALERT CARD or  IMMUNOTHERAPY ALERT CARD at check-in to the Emergency Department and triage nurse.  Should you have questions after your visit or need to cancel or reschedule your appointment, please contact [facility]  Dept: [phone]  and follow the prompts.  Office hours are 8:00 a.m. to 4:30 p.m. Monday - Friday. Please note that voicemails left after 4:00 p.m. may not be returned until the following business day.  We are closed weekends and major holidays. You have access to a nurse at all times for urgent questions. Please call the main number to the clinic Dept: [phone] and follow the prompts.   For any non-urgent questions, you may also contact your provider using MyChart. We now offer e-Visits for anyone 73 and older to request care online for non-urgent symptoms. For details visit [email or web address].   Also download the MyChart app! Go to the app store, search "MyChart", open the app, select [facility], and log in with your MyChart username and password.

## 2024-01-23 DIAGNOSIS — H353111 Nonexudative age-related macular degeneration, right eye, early dry stage: Secondary | ICD-10-CM | POA: Diagnosis not present

## 2024-01-23 DIAGNOSIS — H52203 Unspecified astigmatism, bilateral: Secondary | ICD-10-CM | POA: Diagnosis not present

## 2024-01-23 DIAGNOSIS — Z961 Presence of intraocular lens: Secondary | ICD-10-CM | POA: Diagnosis not present

## 2024-02-03 ENCOUNTER — Inpatient Hospital Stay

## 2024-02-03 ENCOUNTER — Encounter: Payer: Self-pay | Admitting: Hematology and Oncology

## 2024-02-03 ENCOUNTER — Inpatient Hospital Stay: Admitting: Hematology and Oncology

## 2024-02-03 VITALS — BP 170/70 | HR 67 | Temp 98.2°F | Resp 18 | Ht 62.0 in | Wt 130.8 lb

## 2024-02-03 VITALS — BP 173/77 | HR 63 | Temp 97.5°F | Resp 16

## 2024-02-03 DIAGNOSIS — C541 Malignant neoplasm of endometrium: Secondary | ICD-10-CM | POA: Diagnosis not present

## 2024-02-03 DIAGNOSIS — Z8542 Personal history of malignant neoplasm of other parts of uterus: Secondary | ICD-10-CM | POA: Diagnosis not present

## 2024-02-03 DIAGNOSIS — Z5112 Encounter for antineoplastic immunotherapy: Secondary | ICD-10-CM | POA: Diagnosis not present

## 2024-02-03 DIAGNOSIS — Z7189 Other specified counseling: Secondary | ICD-10-CM

## 2024-02-03 DIAGNOSIS — C773 Secondary and unspecified malignant neoplasm of axilla and upper limb lymph nodes: Secondary | ICD-10-CM | POA: Diagnosis not present

## 2024-02-03 DIAGNOSIS — Z853 Personal history of malignant neoplasm of breast: Secondary | ICD-10-CM | POA: Diagnosis not present

## 2024-02-03 MED ORDER — HEPARIN SOD (PORK) LOCK FLUSH 100 UNIT/ML IV SOLN
500.0000 [IU] | Freq: Once | INTRAVENOUS | Status: AC | PRN
Start: 2024-02-03 — End: 2024-02-03
  Administered 2024-02-03: 500 [IU]

## 2024-02-03 MED ORDER — SODIUM CHLORIDE 0.9% FLUSH
10.0000 mL | INTRAVENOUS | Status: DC | PRN
  Administered 2024-02-03: 10 mL

## 2024-02-03 MED ORDER — TRASTUZUMAB-ANNS CHEMO 150 MG IV SOLR
6.0000 mg/kg | Freq: Once | INTRAVENOUS | Status: AC
  Administered 2024-02-03: 357 mg via INTRAVENOUS
  Filled 2024-02-03: qty 17

## 2024-02-03 MED ORDER — SODIUM CHLORIDE 0.9 % IV SOLN: Freq: Once | INTRAVENOUS | Status: AC

## 2024-02-03 NOTE — Progress Notes (Signed)
 [facility] OFFICE PROGRESS NOTE  Patient Care Team: Vicki Ade, [name]  Assessment & Plan Endometrial cancer [facility]) The patient was originally diagnosed with uterine cancer in 2018 status post surgery, on background history of left breast cancer.  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    Vicki Bedford, MD  INTERVAL HISTORY: she returns for treatment follow-up Complications related to previous cycle of chemotherapy included none She has no recent concern for cancer recurrence No recent signs or symptoms of congestive heart failure  PHYSICAL EXAMINATION: ECOG PERFORMANCE STATUS: 0 - Asymptomatic  No results found for: [id number]    Latest Ref Rng & Units 10/14/2023    8:16 AM 09/02/2023   11:29 AM 08/13/2023    9:12 AM  CBC  WBC 4.0 - 10.5 K/uL 6.1  6.5  6.2   Hemoglobin 12.0 - 15.0 g/dL 89.2  88.4  88.6   Hematocrit 36.0 - 46.0 % 30.2  33.1  31.9   Platelets 150 - 400 K/uL 241  227  239       Chemistry      Component Value Date/Time   NA 139 10/14/2023 0816   NA 139 02/13/2017 1512   K 3.6 10/14/2023 0816   K 4.2 02/13/2017 1512   CL 102 10/14/2023 0816   CO2 28 10/14/2023 0816   CO2  28 02/13/2017 1512   BUN 26 (H) 10/14/2023 0816   BUN 25.2 02/13/2017 1512   CREATININE 1.24 (H) 10/14/2023 0816   CREATININE 1.20 (H) 11/05/2022 1215   CREATININE 1.2 (H) 02/13/2017 1512      Component Value Date/Time   CALCIUM  8.6 (L) 10/14/2023 0816   CALCIUM  10.0 02/13/2017 1512   ALKPHOS 63 10/14/2023 0816   AST 18 10/14/2023 0816   AST 19 11/05/2022 1215   ALT 14 10/14/2023 0816   ALT 12 11/05/2022 1215   BILITOT 0.4 10/14/2023 0816   BILITOT 0.4 11/05/2022 1215       Vitals:   02/03/24 1047  BP: (!) 170/70  Pulse: 67  Resp: 18  Temp: 98.2 F (36.8 C)  SpO2: 95%   Filed Weights   02/03/24 1047  Weight: 130 lb 12.8 oz (59.3 kg)

## 2024-02-03 NOTE — Patient Instructions (Signed)
 [facility]  Discharge Instructions: Thank you for choosing [facility] to provide your oncology and hematology care.   If you have a lab appointment with the Cancer Center, please go directly to the Cancer Center and check in at the registration area.   Wear comfortable clothing and clothing appropriate for easy access to any Portacath or PICC line.   We strive to give you quality time with your provider. You may need to reschedule your appointment if you arrive late (15 or more minutes).  Arriving late affects you and other patients whose appointments are after yours.  Also, if you miss three or more appointments without notifying the office, you may be dismissed from the clinic at the provider's discretion.      For prescription refill requests, have your pharmacy contact our office and allow 72 hours for refills to be completed.    Today you received the following chemotherapy and/or immunotherapy agents: Kanjinti      To help prevent nausea and vomiting after your treatment, we encourage you to take your nausea medication as directed.  BELOW ARE SYMPTOMS THAT SHOULD BE REPORTED IMMEDIATELY: *FEVER GREATER THAN 100.4 F (38 C) OR HIGHER *CHILLS OR SWEATING *NAUSEA AND VOMITING THAT IS NOT CONTROLLED WITH YOUR NAUSEA MEDICATION *UNUSUAL SHORTNESS OF BREATH *UNUSUAL BRUISING OR BLEEDING *URINARY PROBLEMS (pain or burning when urinating, or frequent urination) *BOWEL PROBLEMS (unusual diarrhea, constipation, pain near the anus) TENDERNESS IN MOUTH AND THROAT WITH OR WITHOUT PRESENCE OF ULCERS (sore throat, sores in mouth, or a toothache) UNUSUAL RASH, SWELLING OR PAIN  UNUSUAL VAGINAL DISCHARGE OR ITCHING   Items with * indicate a potential emergency and should be followed up as soon as possible or go to the Emergency Department if any problems should occur.  Please show the CHEMOTHERAPY ALERT CARD or IMMUNOTHERAPY  ALERT CARD at check-in to the Emergency Department and triage nurse.  Should you have questions after your visit or need to cancel or reschedule your appointment, please contact [facility]  Dept: [phone]  and follow the prompts.  Office hours are 8:00 a.m. to 4:30 p.m. Monday - Friday. Please note that voicemails left after 4:00 p.m. may not be returned until the following business day.  We are closed weekends and major holidays. You have access to a nurse at all times for urgent questions. Please call the main number to the clinic Dept: [phone] and follow the prompts.   For any non-urgent questions, you may also contact your provider using MyChart. We now offer e-Visits for anyone 60 and older to request care online for non-urgent symptoms. For details visit [email or web address].   Also download the MyChart app! Go to the app store, search "MyChart", open the app, select [facility], and log in with your MyChart username and password.

## 2024-02-03 NOTE — Assessment & Plan Note (Addendum)
 The patient was originally diagnosed with uterine cancer in 2018 status post surgery, on background history of left breast cancer.  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

## 2024-02-06 DIAGNOSIS — C541 Malignant neoplasm of endometrium: Secondary | ICD-10-CM | POA: Diagnosis not present

## 2024-02-06 DIAGNOSIS — R030 Elevated blood-pressure reading, without diagnosis of hypertension: Secondary | ICD-10-CM | POA: Diagnosis not present

## 2024-02-06 DIAGNOSIS — E039 Hypothyroidism, unspecified: Secondary | ICD-10-CM | POA: Diagnosis not present

## 2024-02-06 DIAGNOSIS — I1310 Hypertensive heart and chronic kidney disease without heart failure, with stage 1 through stage 4 chronic kidney disease, or unspecified chronic kidney disease: Secondary | ICD-10-CM | POA: Diagnosis not present

## 2024-02-23 DIAGNOSIS — E039 Hypothyroidism, unspecified: Secondary | ICD-10-CM | POA: Diagnosis not present

## 2024-02-23 DIAGNOSIS — C541 Malignant neoplasm of endometrium: Secondary | ICD-10-CM | POA: Diagnosis not present

## 2024-02-23 DIAGNOSIS — E041 Nontoxic single thyroid nodule: Secondary | ICD-10-CM | POA: Diagnosis not present

## 2024-02-23 DIAGNOSIS — I700 Atherosclerosis of aorta: Secondary | ICD-10-CM | POA: Diagnosis not present

## 2024-02-24 ENCOUNTER — Encounter: Payer: Self-pay | Admitting: Hematology and Oncology

## 2024-02-24 ENCOUNTER — Inpatient Hospital Stay: Attending: Gynecologic Oncology

## 2024-02-24 VITALS — BP 168/65 | HR 72 | Temp 97.7°F | Resp 17 | Wt 130.2 lb

## 2024-02-24 DIAGNOSIS — C541 Malignant neoplasm of endometrium: Secondary | ICD-10-CM

## 2024-02-24 DIAGNOSIS — Z8542 Personal history of malignant neoplasm of other parts of uterus: Secondary | ICD-10-CM | POA: Diagnosis not present

## 2024-02-24 DIAGNOSIS — C773 Secondary and unspecified malignant neoplasm of axilla and upper limb lymph nodes: Secondary | ICD-10-CM | POA: Diagnosis not present

## 2024-02-24 DIAGNOSIS — Z7189 Other specified counseling: Secondary | ICD-10-CM

## 2024-02-24 DIAGNOSIS — Z5112 Encounter for antineoplastic immunotherapy: Secondary | ICD-10-CM | POA: Diagnosis not present

## 2024-02-24 MED ORDER — TRASTUZUMAB-ANNS CHEMO 150 MG IV SOLR
6.0000 mg/kg | Freq: Once | INTRAVENOUS | Status: AC
  Administered 2024-02-24 (×2): 357 mg via INTRAVENOUS
  Filled 2024-02-24: qty 17

## 2024-02-24 MED ORDER — SODIUM CHLORIDE 0.9% FLUSH
10.0000 mL | INTRAVENOUS | Status: DC | PRN
  Administered 2024-02-24 (×2): 10 mL

## 2024-02-24 MED ORDER — SODIUM CHLORIDE 0.9 % IV SOLN: Freq: Once | INTRAVENOUS | Status: AC

## 2024-02-24 NOTE — Patient Instructions (Signed)

## 2024-02-25 DIAGNOSIS — N1832 Chronic kidney disease, stage 3b: Secondary | ICD-10-CM | POA: Diagnosis not present

## 2024-02-25 DIAGNOSIS — E039 Hypothyroidism, unspecified: Secondary | ICD-10-CM | POA: Diagnosis not present

## 2024-02-25 DIAGNOSIS — I129 Hypertensive chronic kidney disease with stage 1 through stage 4 chronic kidney disease, or unspecified chronic kidney disease: Secondary | ICD-10-CM | POA: Diagnosis not present

## 2024-02-25 DIAGNOSIS — E785 Hyperlipidemia, unspecified: Secondary | ICD-10-CM | POA: Diagnosis not present

## 2024-02-27 ENCOUNTER — Ambulatory Visit: Payer: Medicare HMO | Admitting: Gynecologic Oncology

## 2024-03-09 ENCOUNTER — Telehealth: Payer: Self-pay

## 2024-03-09 NOTE — Telephone Encounter (Signed)
 Attempted to return patient's call. LVM with callback number for patient to return call at more convenient time.

## 2024-03-09 NOTE — Telephone Encounter (Signed)
 S/w patient who needed to reschedule appointments from 9/2 to the following week d/t being out of town.  Patient appointments r/s for the following week. New appointment date and times confirmed.

## 2024-03-16 ENCOUNTER — Ambulatory Visit

## 2024-03-16 ENCOUNTER — Ambulatory Visit: Admitting: Hematology and Oncology

## 2024-03-23 ENCOUNTER — Inpatient Hospital Stay

## 2024-03-23 ENCOUNTER — Encounter: Payer: Self-pay | Admitting: Hematology and Oncology

## 2024-03-23 ENCOUNTER — Inpatient Hospital Stay: Attending: Gynecologic Oncology | Admitting: Hematology and Oncology

## 2024-03-23 VITALS — BP 172/63 | HR 84 | Temp 97.8°F | Resp 18 | Ht 62.0 in | Wt 128.6 lb

## 2024-03-23 DIAGNOSIS — Z8542 Personal history of malignant neoplasm of other parts of uterus: Secondary | ICD-10-CM | POA: Diagnosis not present

## 2024-03-23 DIAGNOSIS — Z5112 Encounter for antineoplastic immunotherapy: Secondary | ICD-10-CM | POA: Diagnosis not present

## 2024-03-23 DIAGNOSIS — C541 Malignant neoplasm of endometrium: Secondary | ICD-10-CM | POA: Diagnosis not present

## 2024-03-23 DIAGNOSIS — Z7189 Other specified counseling: Secondary | ICD-10-CM

## 2024-03-23 DIAGNOSIS — C773 Secondary and unspecified malignant neoplasm of axilla and upper limb lymph nodes: Secondary | ICD-10-CM | POA: Insufficient documentation

## 2024-03-23 MED ORDER — SODIUM CHLORIDE 0.9 % IV SOLN: Freq: Once | INTRAVENOUS | Status: AC

## 2024-03-23 MED ORDER — TRASTUZUMAB-ANNS CHEMO 150 MG IV SOLR
6.0000 mg/kg | Freq: Once | INTRAVENOUS | Status: AC
  Administered 2024-03-23: 357 mg via INTRAVENOUS
  Filled 2024-03-23: qty 17

## 2024-03-23 MED ORDER — SODIUM CHLORIDE 0.9% FLUSH
10.0000 mL | INTRAVENOUS | Status: DC | PRN
  Administered 2024-03-23: 10 mL

## 2024-03-23 NOTE — Patient Instructions (Signed)
 [facility]  Discharge Instructions: Thank you for choosing [facility] to provide your oncology and hematology care.   If you have a lab appointment with the Cancer Center, please go directly to the Cancer Center and check in at the registration area.   Wear comfortable clothing and clothing appropriate for easy access to any Portacath or PICC line.   We strive to give you quality time with your provider. You may need to reschedule your appointment if you arrive late (15 or more minutes).  Arriving late affects you and other patients whose appointments are after yours.  Also, if you miss three or more appointments without notifying the office, you may be dismissed from the clinic at the provider's discretion.      For prescription refill requests, have your pharmacy contact our office and allow 72 hours for refills to be completed.    Today you received the following chemotherapy and/or immunotherapy agents kanjinti      To help prevent nausea and vomiting after your treatment, we encourage you to take your nausea medication as directed.  BELOW ARE SYMPTOMS THAT SHOULD BE REPORTED IMMEDIATELY: *FEVER GREATER THAN 100.4 F (38 C) OR HIGHER *CHILLS OR SWEATING *NAUSEA AND VOMITING THAT IS NOT CONTROLLED WITH YOUR NAUSEA MEDICATION *UNUSUAL SHORTNESS OF BREATH *UNUSUAL BRUISING OR BLEEDING *URINARY PROBLEMS (pain or burning when urinating, or frequent urination) *BOWEL PROBLEMS (unusual diarrhea, constipation, pain near the anus) TENDERNESS IN MOUTH AND THROAT WITH OR WITHOUT PRESENCE OF ULCERS (sore throat, sores in mouth, or a toothache) UNUSUAL RASH, SWELLING OR PAIN  UNUSUAL VAGINAL DISCHARGE OR ITCHING   Items with * indicate a potential emergency and should be followed up as soon as possible or go to the Emergency Department if any problems should occur.  Please show the CHEMOTHERAPY ALERT CARD or IMMUNOTHERAPY  ALERT CARD at check-in to the Emergency Department and triage nurse.  Should you have questions after your visit or need to cancel or reschedule your appointment, please contact [facility]  Dept: [phone]  and follow the prompts.  Office hours are 8:00 a.m. to 4:30 p.m. Monday - Friday. Please note that voicemails left after 4:00 p.m. may not be returned until the following business day.  We are closed weekends and major holidays. You have access to a nurse at all times for urgent questions. Please call the main number to the clinic Dept: [phone] and follow the prompts.   For any non-urgent questions, you may also contact your provider using MyChart. We now offer e-Visits for anyone 81 and older to request care online for non-urgent symptoms. For details visit [email or web address].   Also download the MyChart app! Go to the app store, search "MyChart", open the app, select [facility], and log in with your MyChart username and password.

## 2024-03-23 NOTE — Assessment & Plan Note (Addendum)
 The patient was originally diagnosed with uterine cancer in 2018 status post surgery, on background history of left breast cancer.  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

## 2024-03-23 NOTE — Progress Notes (Signed)
 [facility] OFFICE PROGRESS NOTE  Patient Care Team: Shepard Ade, [name]  Assessment & Plan Endometrial cancer [facility]) The patient was originally diagnosed with uterine cancer in 2018 status post surgery, on background history of left breast cancer.  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  She tolerated treatment well without major side effects Recent CT imaging from April 2025 showed no evidence of disease progression The plan will be to continue treatment indefinitely Recent echocardiogram in May showed preserved ejection fraction Her next echocardiogram will be 4 months, due in September I will continue to see her every other visit Her next imaging study would be once a year in April 2026   Orders Placed This Encounter  Procedures   ECHOCARDIOGRAM COMPLETE    Standing Status:   Future    Expected Date:   04/06/2024    Expiration Date:   03/23/2025    Perflutren DEFINITY (image enhancing agent) should be administered unless hypersensitivity or allergy exist:   Administer Perflutren    Where should this test be performed:   [facility]    Does the patient weigh less than or greater than 250 lbs?:   Patient weighs less than 250 lbs    Reason for exam-Echo:   Chemo  Z09     Almarie Bedford, MD  INTERVAL HISTORY: she returns for treatment follow-up on maintenance trastuzumab  Complications related to previous cycle of chemotherapy included none She has no signs or symptoms of congestive heart failure while on trastuzumab   PHYSICAL EXAMINATION: ECOG PERFORMANCE STATUS: 0 - Asymptomatic  No results found for: RJW874    Latest Ref Rng & Units 10/14/2023     8:16 AM 09/02/2023   11:29 AM 08/13/2023    9:12 AM  CBC  WBC 4.0 - 10.5 K/uL 6.1  6.5  6.2   Hemoglobin 12.0 - 15.0 g/dL 89.2  88.4  88.6   Hematocrit 36.0 - 46.0 % 30.2  33.1  31.9   Platelets 150 - 400 K/uL 241  227  239       Chemistry      Component Value Date/Time   NA 139 10/14/2023 0816   NA 139 02/13/2017 1512   K 3.6 10/14/2023 0816   K 4.2 02/13/2017 1512   CL 102 10/14/2023 0816   CO2 28 10/14/2023 0816   CO2 28 02/13/2017 1512   BUN 26 (H) 10/14/2023 0816   BUN 25.2 02/13/2017 1512   CREATININE 1.24 (H) 10/14/2023 0816   CREATININE 1.20 (H) 11/05/2022 1215   CREATININE 1.2 (H) 02/13/2017 1512      Component Value Date/Time   CALCIUM  8.6 (L) 10/14/2023 0816   CALCIUM  10.0 02/13/2017 1512   ALKPHOS 63 10/14/2023 0816   AST 18 10/14/2023 0816   AST 19 11/05/2022 1215   ALT 14 10/14/2023 0816   ALT 12 11/05/2022 1215   BILITOT 0.4 10/14/2023 0816   BILITOT 0.4 11/05/2022 1215       Vitals:   03/23/24 1510  BP: (!) 172/63  Pulse: 84  Resp: 18  Temp: 97.8 F (36.6 C)  SpO2: 96%   Filed Weights   03/23/24 1510  Weight: 128 lb 9.6 oz (58.3 kg)

## 2024-03-29 DIAGNOSIS — H35363 Drusen (degenerative) of macula, bilateral: Secondary | ICD-10-CM | POA: Diagnosis not present

## 2024-03-29 DIAGNOSIS — H353131 Nonexudative age-related macular degeneration, bilateral, early dry stage: Secondary | ICD-10-CM | POA: Diagnosis not present

## 2024-03-29 DIAGNOSIS — H35721 Serous detachment of retinal pigment epithelium, right eye: Secondary | ICD-10-CM | POA: Diagnosis not present

## 2024-03-29 DIAGNOSIS — H26492 Other secondary cataract, left eye: Secondary | ICD-10-CM | POA: Diagnosis not present

## 2024-03-30 ENCOUNTER — Other Ambulatory Visit: Payer: Self-pay | Admitting: Hematology and Oncology

## 2024-03-30 ENCOUNTER — Ambulatory Visit (HOSPITAL_COMMUNITY)
Admission: RE | Admit: 2024-03-30 | Discharge: 2024-03-30 | Disposition: A | Discharge status: Home / Self Care | Source: Ambulatory Visit | Attending: Internal Medicine | Admitting: Internal Medicine

## 2024-03-30 DIAGNOSIS — E785 Hyperlipidemia, unspecified: Secondary | ICD-10-CM | POA: Insufficient documentation

## 2024-03-30 DIAGNOSIS — Z853 Personal history of malignant neoplasm of breast: Secondary | ICD-10-CM | POA: Insufficient documentation

## 2024-03-30 DIAGNOSIS — I129 Hypertensive chronic kidney disease with stage 1 through stage 4 chronic kidney disease, or unspecified chronic kidney disease: Secondary | ICD-10-CM | POA: Insufficient documentation

## 2024-03-30 DIAGNOSIS — N183 Chronic kidney disease, stage 3 unspecified: Secondary | ICD-10-CM | POA: Diagnosis not present

## 2024-03-30 DIAGNOSIS — C773 Secondary and unspecified malignant neoplasm of axilla and upper limb lymph nodes: Secondary | ICD-10-CM | POA: Diagnosis not present

## 2024-03-30 DIAGNOSIS — Z7189 Other specified counseling: Secondary | ICD-10-CM | POA: Diagnosis not present

## 2024-03-30 DIAGNOSIS — Z9221 Personal history of antineoplastic chemotherapy: Secondary | ICD-10-CM | POA: Insufficient documentation

## 2024-03-30 DIAGNOSIS — C541 Malignant neoplasm of endometrium: Secondary | ICD-10-CM

## 2024-03-30 DIAGNOSIS — I351 Nonrheumatic aortic (valve) insufficiency: Secondary | ICD-10-CM | POA: Diagnosis not present

## 2024-03-30 LAB — ECHOCARDIOGRAM COMPLETE
Area-P 1/2: 3.6 cm2
S' Lateral: 2.39 cm

## 2024-04-13 ENCOUNTER — Inpatient Hospital Stay

## 2024-04-13 ENCOUNTER — Encounter: Payer: Self-pay | Admitting: Hematology and Oncology

## 2024-04-13 VITALS — BP 166/69 | HR 65 | Temp 97.9°F | Resp 16 | Wt 130.2 lb

## 2024-04-13 DIAGNOSIS — Z8542 Personal history of malignant neoplasm of other parts of uterus: Secondary | ICD-10-CM | POA: Diagnosis not present

## 2024-04-13 DIAGNOSIS — C773 Secondary and unspecified malignant neoplasm of axilla and upper limb lymph nodes: Secondary | ICD-10-CM | POA: Diagnosis not present

## 2024-04-13 DIAGNOSIS — Z7189 Other specified counseling: Secondary | ICD-10-CM

## 2024-04-13 DIAGNOSIS — C541 Malignant neoplasm of endometrium: Secondary | ICD-10-CM

## 2024-04-13 DIAGNOSIS — Z5112 Encounter for antineoplastic immunotherapy: Secondary | ICD-10-CM | POA: Diagnosis not present

## 2024-04-13 MED ORDER — TRASTUZUMAB-ANNS CHEMO 150 MG IV SOLR
6.0000 mg/kg | Freq: Once | INTRAVENOUS | Status: AC
  Administered 2024-04-13: 357 mg via INTRAVENOUS
  Filled 2024-04-13: qty 17

## 2024-04-13 MED ORDER — SODIUM CHLORIDE 0.9 % IV SOLN: Freq: Once | INTRAVENOUS | Status: AC

## 2024-04-13 NOTE — Patient Instructions (Signed)
 [facility] - A DEPT OF [facility]  Discharge Instructions: Thank you for choosing [facility] to provide your oncology and hematology care.   If you have a lab appointment with the Cancer Center, please go directly to the Cancer Center and check in at the registration area.   Wear comfortable clothing and clothing appropriate for easy access to any Portacath or PICC line.   We strive to give you quality time with your provider. You may need to reschedule your appointment if you arrive late (15 or more minutes).  Arriving late affects you and other patients whose appointments are after yours.  Also, if you miss three or more appointments without notifying the office, you may be dismissed from the clinic at the provider's discretion.      For prescription refill requests, have your pharmacy contact our office and allow 72 hours for refills to be completed.    Today you received the following chemotherapy and/or immunotherapy agents: Trastuzumab -anns (Kanjinti )    To help prevent nausea and vomiting after your treatment, we encourage you to take your nausea medication as directed.  BELOW ARE SYMPTOMS THAT SHOULD BE REPORTED IMMEDIATELY: *FEVER GREATER THAN 100.4 F (38 C) OR HIGHER *CHILLS OR SWEATING *NAUSEA AND VOMITING THAT IS NOT CONTROLLED WITH YOUR NAUSEA MEDICATION *UNUSUAL SHORTNESS OF BREATH *UNUSUAL BRUISING OR BLEEDING *URINARY PROBLEMS (pain or burning when urinating, or frequent urination) *BOWEL PROBLEMS (unusual diarrhea, constipation, pain near the anus) TENDERNESS IN MOUTH AND THROAT WITH OR WITHOUT PRESENCE OF ULCERS (sore throat, sores in mouth, or a toothache) UNUSUAL RASH, SWELLING OR PAIN  UNUSUAL VAGINAL DISCHARGE OR ITCHING   Items with * indicate a potential emergency and should be followed up as soon as possible or go to the Emergency Department if any problems should occur.  Please show the CHEMOTHERAPY ALERT CARD  or IMMUNOTHERAPY ALERT CARD at check-in to the Emergency Department and triage nurse.  Should you have questions after your visit or need to cancel or reschedule your appointment, please contact [facility] - A DEPT OF [facility]  Dept: [phone]  and follow the prompts.  Office hours are 8:00 a.m. to 4:30 p.m. Monday - Friday. Please note that voicemails left after 4:00 p.m. may not be returned until the following business day.  We are closed weekends and major holidays. You have access to a nurse at all times for urgent questions. Please call the main number to the clinic Dept: [phone] and follow the prompts.   For any non-urgent questions, you may also contact your provider using MyChart. We now offer e-Visits for anyone 57 and older to request care online for non-urgent symptoms. For details visit [email or web address].   Also download the MyChart app! Go to the app store, search MyChart, open the app, select [facility], and log in with your MyChart username and password.

## 2024-04-21 ENCOUNTER — Other Ambulatory Visit: Payer: Self-pay | Admitting: Hematology and Oncology

## 2024-04-21 ENCOUNTER — Telehealth: Payer: Self-pay | Admitting: *Deleted

## 2024-04-21 NOTE — Telephone Encounter (Signed)
 I will coordinate changes with scheduler

## 2024-04-21 NOTE — Telephone Encounter (Signed)
 [name] is going out of town 10/18-10/27 to care for her daughter in law post surgery and will be gone on 10/21, date of next infusion.  She would like to change the infusion to 10/28.  Informed her that message will be sent to Dr. Lonn. She verbalized understanding

## 2024-04-21 NOTE — Telephone Encounter (Signed)
 Contacted [name] to inform that Dr. Lonn will coordinate appt change with scheduling and that scheduling will contact her with times. She verbalized understanding and thanked this caller

## 2024-04-30 ENCOUNTER — Other Ambulatory Visit (HOSPITAL_COMMUNITY)

## 2024-05-04 ENCOUNTER — Ambulatory Visit: Admitting: Hematology and Oncology

## 2024-05-04 ENCOUNTER — Ambulatory Visit

## 2024-05-11 ENCOUNTER — Inpatient Hospital Stay: Attending: Gynecologic Oncology

## 2024-05-11 ENCOUNTER — Inpatient Hospital Stay: Admitting: Hematology and Oncology

## 2024-05-11 ENCOUNTER — Encounter: Payer: Self-pay | Admitting: Hematology and Oncology

## 2024-05-11 VITALS — BP 170/63 | HR 62 | Temp 97.7°F | Resp 16 | Wt 131.2 lb

## 2024-05-11 DIAGNOSIS — C541 Malignant neoplasm of endometrium: Secondary | ICD-10-CM

## 2024-05-11 DIAGNOSIS — C773 Secondary and unspecified malignant neoplasm of axilla and upper limb lymph nodes: Secondary | ICD-10-CM | POA: Insufficient documentation

## 2024-05-11 DIAGNOSIS — Z5112 Encounter for antineoplastic immunotherapy: Secondary | ICD-10-CM | POA: Insufficient documentation

## 2024-05-11 DIAGNOSIS — Z8542 Personal history of malignant neoplasm of other parts of uterus: Secondary | ICD-10-CM | POA: Diagnosis not present

## 2024-05-11 DIAGNOSIS — Z7189 Other specified counseling: Secondary | ICD-10-CM

## 2024-05-11 MED ORDER — SODIUM CHLORIDE 0.9% FLUSH
10.0000 mL | INTRAVENOUS | Status: DC | PRN
  Administered 2024-05-11: 10 mL

## 2024-05-11 MED ORDER — TRASTUZUMAB-ANNS CHEMO 150 MG IV SOLR
6.0000 mg/kg | Freq: Once | INTRAVENOUS | Status: AC
  Administered 2024-05-11: 357 mg via INTRAVENOUS
  Filled 2024-05-11: qty 17

## 2024-05-11 MED ORDER — SODIUM CHLORIDE 0.9 % IV SOLN: Freq: Once | INTRAVENOUS | Status: AC

## 2024-05-11 NOTE — Progress Notes (Signed)
 [facility] OFFICE PROGRESS NOTE  Patient Care Team: Shepard Ade, [name]  Assessment & Plan Endometrial cancer [facility]) The patient was originally diagnosed with uterine cancer in 2018 status post surgery, on background history of left breast cancer.  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    Vicki Bedford, MD  INTERVAL HISTORY: she returns for treatment follow-up Complications related to previous cycle of chemotherapy included none  PHYSICAL EXAMINATION: ECOG PERFORMANCE STATUS: 0 - Asymptomatic  No results found for: CAN125    Latest Ref Rng & Units 10/14/2023    8:16 AM 09/02/2023   11:29 AM 08/13/2023    9:12 AM  CBC  WBC 4.0 - 10.5 K/uL 6.1  6.5  6.2   Hemoglobin 12.0 - 15.0 g/dL 89.2  88.4  88.6   Hematocrit 36.0 - 46.0 % 30.2  33.1  31.9   Platelets 150 - 400 K/uL 241  227  239       Chemistry      Component Value Date/Time   NA 139 10/14/2023 0816   NA 139 02/13/2017 1512   K 3.6 10/14/2023 0816   K 4.2 02/13/2017 1512   CL 102 10/14/2023 0816   CO2 28 10/14/2023 0816   CO2 28 02/13/2017 1512   BUN 26 (H) 10/14/2023 0816   BUN 25.2 02/13/2017 1512   CREATININE 1.24 (H)  10/14/2023 0816   CREATININE 1.20 (H) 11/05/2022 1215   CREATININE 1.2 (H) 02/13/2017 1512      Component Value Date/Time   CALCIUM  8.6 (L) 10/14/2023 0816   CALCIUM  10.0 02/13/2017 1512   ALKPHOS 63 10/14/2023 0816   AST 18 10/14/2023 0816   AST 19 11/05/2022 1215   ALT 14 10/14/2023 0816   ALT 12 11/05/2022 1215   BILITOT 0.4 10/14/2023 0816   BILITOT 0.4 11/05/2022 1215       There were no vitals filed for this visit. There were no vitals filed for this visit. Other relevant data reviewed during this visit included echocardiogram report from September

## 2024-05-11 NOTE — Assessment & Plan Note (Addendum)
 The patient was originally diagnosed with uterine cancer in 2018 status post surgery, on background history of left breast cancer.  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

## 2024-05-11 NOTE — Patient Instructions (Signed)

## 2024-06-01 ENCOUNTER — Inpatient Hospital Stay

## 2024-06-01 ENCOUNTER — Telehealth: Payer: Self-pay

## 2024-06-01 ENCOUNTER — Other Ambulatory Visit: Payer: Self-pay | Admitting: Hematology and Oncology

## 2024-06-01 ENCOUNTER — Encounter: Payer: Self-pay | Admitting: Hematology and Oncology

## 2024-06-01 DIAGNOSIS — I10 Essential (primary) hypertension: Secondary | ICD-10-CM | POA: Diagnosis not present

## 2024-06-01 DIAGNOSIS — R5383 Other fatigue: Secondary | ICD-10-CM | POA: Diagnosis not present

## 2024-06-01 NOTE — Telephone Encounter (Signed)
 Called and left a message asking her to call the office back about missing appt today so the office can reschedule her missed appt.

## 2024-06-01 NOTE — Telephone Encounter (Signed)
 Patient did not show up for 0830 infusion appointment. RN called to check in on patient. No answer. Left voicemail.

## 2024-06-03 ENCOUNTER — Encounter: Payer: Self-pay | Admitting: Hematology and Oncology

## 2024-06-03 ENCOUNTER — Inpatient Hospital Stay (HOSPITAL_BASED_OUTPATIENT_CLINIC_OR_DEPARTMENT_OTHER): Admitting: Hematology and Oncology

## 2024-06-03 ENCOUNTER — Inpatient Hospital Stay: Attending: Gynecologic Oncology

## 2024-06-03 DIAGNOSIS — I10 Essential (primary) hypertension: Secondary | ICD-10-CM | POA: Diagnosis not present

## 2024-06-03 DIAGNOSIS — C773 Secondary and unspecified malignant neoplasm of axilla and upper limb lymph nodes: Secondary | ICD-10-CM | POA: Diagnosis not present

## 2024-06-03 DIAGNOSIS — Z8542 Personal history of malignant neoplasm of other parts of uterus: Secondary | ICD-10-CM | POA: Insufficient documentation

## 2024-06-03 DIAGNOSIS — C541 Malignant neoplasm of endometrium: Secondary | ICD-10-CM

## 2024-06-03 DIAGNOSIS — Z853 Personal history of malignant neoplasm of breast: Secondary | ICD-10-CM | POA: Insufficient documentation

## 2024-06-03 NOTE — Progress Notes (Signed)
 Pt scheduled for C37 Kanjinti . BP was 214/90 (manual 188/86). Lonn, MD notified. Advised to hold treatment for today. Pt being seen by PCP for hypertension and medication adjustments this week. Pt ambulatory to lobby for d/c.

## 2024-06-03 NOTE — Assessment & Plan Note (Addendum)
 The patient was originally diagnosed with uterine cancer in 2018 status post surgery, on background history of left breast cancer.  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

## 2024-06-03 NOTE — Assessment & Plan Note (Addendum)
 Her blood pressure has been suboptimally controlled recently Her blood pressure this morning is very high with systolic blood pressure over 200 and will repeat around 180 She is somewhat symptomatic with sensation of lightheadedness but denies headaches or blurriness of vision We discussed the rationale of canceling her treatment today and she agreed When she returns home, she will take 1 extra dose of amlodipine and to take her second dose in the evening as scheduled If her blood pressure is still not improving by tomorrow morning, I recommend the patient to call her primary care doctor for medication adjustment

## 2024-06-03 NOTE — Progress Notes (Signed)
 [facility] OFF[facility] PROGRESS NOTE  Patient Care Team: Shepard Ade, [name]  Assessment & Plan Endometrial cancer [facility]) The patient was originally diagnosed with uterine cancer in 2018 status post surgery, on background history of left breast cancer.  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  She tolerated treatment well without major side effects Recent CT imaging from April 2025 showed no evidence of disease progression The plan will be to continue treatment indefinitely Recent echocardiogram in September showed preserved ejection fraction Her next echocardiogram will be 4 months, due in January  Her next imaging study would be once a year in April 2026 Due to uncontrolled hypertension and symptoms from uncontrolled hypertension, I will cancel her treatment today Essential hypertension Her blood pressure has been suboptimally controlled recently Her blood pressure this morning is very high with systolic blood pressure over 200 and will repeat around 180 She is somewhat symptomatic with sensation of lightheadedness but denies headaches or blurriness of vision We discussed the rationale of canceling her treatment today and she agreed When she returns home, she will take 1 extra dose of amlodipine and to take her second dose in the evening as scheduled If her blood pressure is still not improving by tomorrow morning, I recommend the patient to call her primary care doctor for medication adjustment  No orders of the defined types were placed in this encounter.    Almarie Bedford, MD  INTERVAL HISTORY: she returns for treatment follow-up Complications related to previous cycle  of chemotherapy included elevated BP  PHYSICAL EXAMINATION: ECOG PERFORMANCE STATUS: 0 - Asymptomatic  No results found for: RJW874    Latest Ref Rng & Units 10/14/2023    8:16 AM 09/02/2023   11:29 AM 08/13/2023    9:12 AM  CBC  WBC 4.0 - 10.5 K/uL 6.1  6.5  6.2   Hemoglobin 12.0 - 15.0 g/dL 89.2  88.4  88.6   Hematocrit 36.0 - 46.0 % 30.2  33.1  31.9   Platelets 150 - 400 K/uL 241  227  239       Chemistry      Component Value Date/Time   NA 139 10/14/2023 0816   NA 139 02/13/2017 1512   K 3.6 10/14/2023 0816   K 4.2 02/13/2017 1512   CL 102 10/14/2023 0816   CO2 28 10/14/2023 0816   CO2 28 02/13/2017 1512   BUN 26 (H) 10/14/2023 0816   BUN 25.2 02/13/2017 1512   CREATININE 1.24 (H) 10/14/2023 0816   CREATININE 1.20 (H) 11/05/2022 1215   CREATININE 1.2 (H) 02/13/2017 1512      Component Value Date/Time   CALCIUM  8.6 (L) 10/14/2023 0816   CALCIUM  10.0 02/13/2017 1512   ALKPHOS 63 10/14/2023 0816   AST 18 10/14/2023 0816   AST 19 11/05/2022 1215   ALT 14 10/14/2023 0816   ALT 12 11/05/2022 1215   BILITOT 0.4 10/14/2023 0816   BILITOT 0.4 11/05/2022 1215       There were no vitals filed for this visit. There were no vitals filed for this visit. Other relevant data reviewed during this visit included elevated blood pressure

## 2024-06-09 DIAGNOSIS — R5383 Other fatigue: Secondary | ICD-10-CM | POA: Diagnosis not present

## 2024-06-09 DIAGNOSIS — I5032 Chronic diastolic (congestive) heart failure: Secondary | ICD-10-CM | POA: Diagnosis not present

## 2024-06-09 DIAGNOSIS — N1832 Chronic kidney disease, stage 3b: Secondary | ICD-10-CM | POA: Diagnosis not present

## 2024-06-09 DIAGNOSIS — I1310 Hypertensive heart and chronic kidney disease without heart failure, with stage 1 through stage 4 chronic kidney disease, or unspecified chronic kidney disease: Secondary | ICD-10-CM | POA: Diagnosis not present

## 2024-06-09 DIAGNOSIS — I130 Hypertensive heart and chronic kidney disease with heart failure and stage 1 through stage 4 chronic kidney disease, or unspecified chronic kidney disease: Secondary | ICD-10-CM | POA: Diagnosis not present

## 2024-06-22 ENCOUNTER — Encounter: Payer: Self-pay | Admitting: Hematology and Oncology

## 2024-06-22 ENCOUNTER — Inpatient Hospital Stay: Attending: Gynecologic Oncology

## 2024-06-22 ENCOUNTER — Inpatient Hospital Stay: Admitting: Hematology and Oncology

## 2024-06-22 VITALS — BP 148/68 | HR 72 | Temp 98.1°F | Resp 18 | Wt 132.5 lb

## 2024-06-22 DIAGNOSIS — Z7189 Other specified counseling: Secondary | ICD-10-CM

## 2024-06-22 DIAGNOSIS — Z5112 Encounter for antineoplastic immunotherapy: Secondary | ICD-10-CM | POA: Insufficient documentation

## 2024-06-22 DIAGNOSIS — Z8542 Personal history of malignant neoplasm of other parts of uterus: Secondary | ICD-10-CM | POA: Insufficient documentation

## 2024-06-22 DIAGNOSIS — C773 Secondary and unspecified malignant neoplasm of axilla and upper limb lymph nodes: Secondary | ICD-10-CM

## 2024-06-22 DIAGNOSIS — C541 Malignant neoplasm of endometrium: Secondary | ICD-10-CM | POA: Diagnosis not present

## 2024-06-22 MED ORDER — SODIUM CHLORIDE 0.9 % IV SOLN: Freq: Once | INTRAVENOUS | Status: AC

## 2024-06-22 MED ORDER — TRASTUZUMAB-ANNS CHEMO 150 MG IV SOLR
6.0000 mg/kg | Freq: Once | INTRAVENOUS | Status: AC
  Administered 2024-06-22: 357 mg via INTRAVENOUS
  Filled 2024-06-22: qty 17

## 2024-06-22 NOTE — Assessment & Plan Note (Addendum)
 The patient was originally diagnosed with uterine cancer in 2018 status post surgery, on background history of left breast cancer.  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

## 2024-06-22 NOTE — Patient Instructions (Signed)
 [facility] - A DEPT OF [facility]  Discharge Instructions: Thank you for choosing [facility] to provide your oncology and hematology care.   If you have a lab appointment with the Cancer Center, please go directly to the Cancer Center and check in at the registration area.   Wear comfortable clothing and clothing appropriate for easy access to any Portacath or PICC line.   We strive to give you quality time with your provider. You may need to reschedule your appointment if you arrive late (15 or more minutes).  Arriving late affects you and other patients whose appointments are after yours.  Also, if you miss three or more appointments without notifying the office, you may be dismissed from the clinic at the provider's discretion.      For prescription refill requests, have your pharmacy contact our office and allow 72 hours for refills to be completed.    Today you received the following chemotherapy and/or immunotherapy agents: trastuzumab -anns (KANJINTI )   To help prevent nausea and vomiting after your treatment, we encourage you to take your nausea medication as directed.  BELOW ARE SYMPTOMS THAT SHOULD BE REPORTED IMMEDIATELY: *FEVER GREATER THAN 100.4 F (38 C) OR HIGHER *CHILLS OR SWEATING *NAUSEA AND VOMITING THAT IS NOT CONTROLLED WITH YOUR NAUSEA MEDICATION *UNUSUAL SHORTNESS OF BREATH *UNUSUAL BRUISING OR BLEEDING *URINARY PROBLEMS (pain or burning when urinating, or frequent urination) *BOWEL PROBLEMS (unusual diarrhea, constipation, pain near the anus) TENDERNESS IN MOUTH AND THROAT WITH OR WITHOUT PRESENCE OF ULCERS (sore throat, sores in mouth, or a toothache) UNUSUAL RASH, SWELLING OR PAIN  UNUSUAL VAGINAL DISCHARGE OR ITCHING   Items with * indicate a potential emergency and should be followed up as soon as possible or go to the Emergency Department if any problems should occur.  Please show the CHEMOTHERAPY ALERT CARD or  IMMUNOTHERAPY ALERT CARD at check-in to the Emergency Department and triage nurse.  Should you have questions after your visit or need to cancel or reschedule your appointment, please contact [facility] - A DEPT OF [facility]  Dept: [phone]  and follow the prompts.  Office hours are 8:00 a.m. to 4:30 p.m. Monday - Friday. Please note that voicemails left after 4:00 p.m. may not be returned until the following business day.  We are closed weekends and major holidays. You have access to a nurse at all times for urgent questions. Please call the main number to the clinic Dept: [phone] and follow the prompts.   For any non-urgent questions, you may also contact your provider using MyChart. We now offer e-Visits for anyone 78 and older to request care online for non-urgent symptoms. For details visit [email or web address].   Also download the MyChart app! Go to the app store, search MyChart, open the app, select [place], and log in with your MyChart username and password.  Trastuzumab  Injection What is this medication? TRASTUZUMAB  (tras TOO zoo mab) treats breast cancer and stomach cancer. It works by blocking a protein that causes cancer cells to grow and multiply. This helps to slow or stop the spread of cancer cells. This medicine may be used for other purposes; ask your health care provider or pharmacist if you have questions. COMMON BRAND NAME(S): [name] , HERCESSI, Herzuma , KANJINTI , Ogivri , Ontruzant , Trazimera  What should I tell my care team before I take this medication? They need to know if you have any of these conditions:  Heart failure Lung disease An unusual or allergic reaction to trastuzumab , other medications, foods, dyes, or preservatives Pregnant or trying to get pregnant Breast-feeding How should I use this medication? This medication is injected into a vein. It is given by your care team in a hospital or clinic setting. Talk  to your care team about the use of this medication in children. It is not approved for use in children. Overdosage: If you think you have taken too much of this medicine contact a poison control center or emergency room at once. NOTE: This medicine is only for you. Do not share this medicine with others. What if I miss a dose? Keep appointments for follow-up doses. It is important not to miss your dose. Call your care team if you are unable to keep an appointment. What may interact with this medication? Certain types of chemotherapy, such as daunorubicin, doxorubicin, epirubicin, idarubicin This list may not describe all possible interactions. Give your health care provider a list of all the medicines, herbs, non-prescription drugs, or dietary supplements you use. Also tell them if you smoke, drink alcohol , or use illegal drugs. Some items may interact with your medicine. What should I watch for while using this medication? Your condition will be monitored carefully while you are receiving this medication. This medication may make you feel generally unwell. This is not uncommon, as chemotherapy affects healthy cells as well as cancer cells. Report any side effects. Continue your course of treatment even though you feel ill unless your care team tells you to stop. This medication may increase your risk of getting an infection. Call your care team for advice if you get a fever, chills, sore throat, or other symptoms of a cold or flu. Do not treat yourself. Try to avoid being around people who are sick. Avoid taking medications that contain aspirin , acetaminophen , ibuprofen , naproxen, or ketoprofen unless instructed by your care team. These medications can hide a fever. Talk to your care team if you may be pregnant. Serious birth defects can occur if you take this medication during pregnancy and for 7 months after the last dose. You will need a negative pregnancy test before starting this medication.  Contraception is recommended while taking this medication and for 7 months after the last dose. Your care team can help you find the option that works for you. Do not breastfeed while taking this medication and for 7 months after stopping treatment. What side effects may I notice from receiving this medication? Side effects that you should report to your care team as soon as possible: Allergic reactions or angioedema--skin rash, itching or hives, swelling of the face, eyes, lips, tongue, arms, or legs, trouble swallowing or breathing Dry cough, shortness of breath or trouble breathing Heart failure--shortness of breath, swelling of the ankles, feet, or hands, sudden weight gain, unusual weakness or fatigue Infection--fever, chills, cough, or sore throat Infusion reactions--chest pain, shortness of breath or trouble breathing, feeling faint or lightheaded Side effects that usually do not require medical attention (report to your care team if they continue or are bothersome): Diarrhea Dizziness Headache Nausea Trouble sleeping Vomiting This list may not describe all possible side effects. Call your doctor for medical advice about side effects. You may report side effects to FDA at 1-800-FDA-1088. Where should I keep my medication? This medication is given in a hospital or clinic. It will not be stored at home. NOTE: This sheet is a summary. It may not cover all possible information. If you  have questions about this medicine, talk to your doctor, pharmacist, or health care provider.  2024 Elsevier/Gold Standard (2021-11-13 00:00:00)

## 2024-06-22 NOTE — Progress Notes (Signed)
 [facility] OFFICE PROGRESS NOTE  Patient Care Team: Shepard Ade, [name]  Assessment & Plan Endometrial cancer [facility]) The patient was originally diagnosed with uterine cancer in 2018 status post surgery, on background history of left breast cancer.  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    Almarie Bedford, MD  INTERVAL HISTORY: she returns for treatment follow-up Complications related to previous cycle of chemotherapy included none Her recent blood pressure medication was adjusted and she felt better  PHYSICAL EXAMINATION: ECOG PERFORMANCE STATUS: 0 - Asymptomatic  No results found for: [id number]    Latest Ref Rng & Units 10/14/2023    8:16 AM 09/02/2023   11:29 AM 08/13/2023    9:12 AM  CBC  WBC 4.0 - 10.5 K/uL 6.1  6.5  6.2   Hemoglobin 12.0 - 15.0 g/dL 89.2  88.4  88.6   Hematocrit 36.0 - 46.0 % 30.2  33.1  31.9   Platelets 150 - 400 K/uL 241  227  239       Chemistry      Component Value Date/Time   NA 139 10/14/2023 0816   NA 139 02/13/2017 1512   K 3.6 10/14/2023 0816   K 4.2 02/13/2017 1512   CL 102 10/14/2023 0816   CO2 28 10/14/2023 0816   CO2 28 02/13/2017 1512   BUN 26 (H)  10/14/2023 0816   BUN 25.2 02/13/2017 1512   CREATININE 1.24 (H) 10/14/2023 0816   CREATININE 1.20 (H) 11/05/2022 1215   CREATININE 1.2 (H) 02/13/2017 1512      Component Value Date/Time   CALCIUM  8.6 (L) 10/14/2023 0816   CALCIUM  10.0 02/13/2017 1512   ALKPHOS 63 10/14/2023 0816   AST 18 10/14/2023 0816   AST 19 11/05/2022 1215   ALT 14 10/14/2023 0816   ALT 12 11/05/2022 1215   BILITOT 0.4 10/14/2023 0816   BILITOT 0.4 11/05/2022 1215

## 2024-06-23 ENCOUNTER — Other Ambulatory Visit: Payer: Self-pay

## 2024-06-28 ENCOUNTER — Telehealth: Payer: Self-pay

## 2024-06-28 ENCOUNTER — Other Ambulatory Visit: Payer: Self-pay | Admitting: Hematology and Oncology

## 2024-06-28 DIAGNOSIS — C541 Malignant neoplasm of endometrium: Secondary | ICD-10-CM

## 2024-06-28 DIAGNOSIS — I10 Essential (primary) hypertension: Secondary | ICD-10-CM

## 2024-06-28 NOTE — Telephone Encounter (Signed)
 Returned her call and given echo # to schedule prior to nest treatment. She will call to schedule.

## 2024-06-30 ENCOUNTER — Encounter (HOSPITAL_COMMUNITY): Payer: Self-pay

## 2024-06-30 DIAGNOSIS — L570 Actinic keratosis: Secondary | ICD-10-CM | POA: Diagnosis not present

## 2024-06-30 DIAGNOSIS — D0471 Carcinoma in situ of skin of right lower limb, including hip: Secondary | ICD-10-CM | POA: Diagnosis not present

## 2024-06-30 DIAGNOSIS — C44622 Squamous cell carcinoma of skin of right upper limb, including shoulder: Secondary | ICD-10-CM | POA: Diagnosis not present

## 2024-06-30 DIAGNOSIS — C44722 Squamous cell carcinoma of skin of right lower limb, including hip: Secondary | ICD-10-CM | POA: Diagnosis not present

## 2024-06-30 DIAGNOSIS — L858 Other specified epidermal thickening: Secondary | ICD-10-CM | POA: Diagnosis not present

## 2024-06-30 DIAGNOSIS — Z85828 Personal history of other malignant neoplasm of skin: Secondary | ICD-10-CM | POA: Diagnosis not present

## 2024-06-30 DIAGNOSIS — D485 Neoplasm of uncertain behavior of skin: Secondary | ICD-10-CM | POA: Diagnosis not present

## 2024-07-02 ENCOUNTER — Encounter: Payer: Self-pay | Admitting: Gynecologic Oncology

## 2024-07-02 ENCOUNTER — Inpatient Hospital Stay (HOSPITAL_BASED_OUTPATIENT_CLINIC_OR_DEPARTMENT_OTHER): Payer: Medicare HMO | Admitting: Gynecologic Oncology

## 2024-07-02 VITALS — BP 140/61 | HR 71 | Temp 97.9°F | Resp 20 | Wt 130.0 lb

## 2024-07-02 DIAGNOSIS — C773 Secondary and unspecified malignant neoplasm of axilla and upper limb lymph nodes: Secondary | ICD-10-CM

## 2024-07-02 DIAGNOSIS — C541 Malignant neoplasm of endometrium: Secondary | ICD-10-CM | POA: Diagnosis not present

## 2024-07-02 DIAGNOSIS — Z5112 Encounter for antineoplastic immunotherapy: Secondary | ICD-10-CM | POA: Diagnosis not present

## 2024-07-02 NOTE — Patient Instructions (Signed)
 It was good to see you today.  I do not see or feel any evidence of cancer recurrence on your exam.  I will see you for follow-up in 6 months.  As always, if you develop any new and concerning symptoms before your next visit, please call to see me sooner.

## 2024-07-02 NOTE — Progress Notes (Signed)
 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 Her cancer was treated with surgery but no radiation or chemotherapy.   Serous endometrial cancer, MSI stable ER positive, PR neg, Her2/neu 3+      Endometrial cancer (HCC)  08/29/2016 Pathology Results   Endometrium, biopsy - HIGH GRADE ENDOMETRIAL CARCINOMA, SEE COMMENT. Microscopic Comment The sections show multiple fragments of adenocarcinoma displaying glandular and papillary patterns associated with high grade cytomorphology characterized by nuclear pleomorphism, prominent nucleoli and brisk mitosis. Immunohistochemical stains show that the tumor cells are positive for vimentin, p16, p53 with increased Ki-67 expression. Estrogen and progesterone receptor stains show patchy weak positivity. No significant positivity is seen with CEA. The findings are consistent with high grade endometrial carcinoma and the overall morphology and phenotypic features favor serous carcinoma.   08/29/2016 Initial Diagnosis   She presented with postmenopausal bleeding   10/03/2016 Imaging   CT C/A/P 09/2016 IMPRESSION: 1. Thickening of the endometrial canal up to 19 mm in fundus, presumably corresponding to the patient's reported endometrial carcinoma. 2. Multiple tiny pulmonary nodules scattered throughout the lungs bilaterally measuring 4 mm or less in size. Nodules of this size are typically considered statistically likely benign. In the setting of known primary malignancy, metastatic disease to the lungs is not excluded, but is not strongly favored on today's examination. Attention on followup studies is recommended to ensure the stability or resolution of these nodules. 3.  Subcentimeter low-attenuation lesion in the central aspect of segment 8 of the liver is too small to characterize. This is statistically likely a tiny cyst, but warrants attention on follow-up studies to exclude the possibility of a solitary hepatic metastasis. 4. 1.5 x 1.5 x 1.7 cm well-circumscribed lesion in the proximal stomach. This is of uncertain etiology and significance, and could represent a benign gastric polyp, however, further evaluation with nonemergent endoscopy is suggested in the near future for further evaluation. 5. **An incidental finding of potential clinical significance has been found. 1.1 x 1.6 cm thyroid  nodule in the inferior aspect of the right lobe of the thyroid  gland. Follow-up evaluation with nonemergent thyroid  ultrasound is recommended in the near future to better evaluate this finding. This recommendation follows ACR consensuss guidelines: Managing Incidental Thyroid  Nodules Detected on Imaging: White Paper of the ACR Incidental Thyroid  Findings Committee. J Am Coll Radiol 2015;12(2):143-150.** 6. Aortic atherosclerosis, in addition to left anterior descending coronary artery disease   10/22/2016 Surgery   Robotic assisted total hysterectomy, BSO and bilateral pelvic lymphadenectomy  Final pathology revealed a 3cm polyp containing serous carcinoma but with no myometrial invasion, no LVSI and negative nodes.  Stage IA Uterine serous cancer   10/22/2016 Pathology Results   1. Lymph nodes, regional resection, right pelvic - SIX BENIGN LYMPH NODES (0/6). 2. Lymph nodes, regional resection, left pelvic - SEVEN BENIGN LYMPH NODES (0/7). 3. Uterus +/- tubes/ovaries, neoplastic, with right ovary and fallopian tube ENDOMYOMETRIUM - SEROUS CARCINOMA ARISING WITHIN AN ENDOMETRIAL POLYP - NO MYOMETRIAL INVASION IDENTIFIED - ADENOMYOSIS - LEIOMYOMA (1 CM) - SEE ONCOLOGY TABLE AND COMMENT CERVIX - CARCINOMA FOCALLY INVOLVES ENDOCERVICAL GLANDS - NABOTHIAN CYSTS RIGHT  ADNEXA - BENIGN OVARY AND FALLOPIAN TUBE - NO CARCINOMA IDENTIFIED 4. Cul-de-sac biopsy - MESOTHELIAL HYPERPLASIA Microscopic  Comment 3. ONCOLOGY TABLE-UTERUS, CARCINOMA OR CARCINOSARCOMA Specimen: Uterus, right fallopian tube and ovary Procedure: Total hysterectomy and right salpingo-oophorectomy Lymph node sampling performed: Bilateral pelvic regional resection Specimen integrity: Intact Maximum tumor size: 3 cm (polyp) Histologic type: Serous carcinoma Grade: High grade Myometrial invasion: Not identified Cervical stromal involvement: No, focal endocervical gland involvement Extent of involvement of other organs: Not identified Lymph - vascular invasion: Not identified Peritoneal washings: N/A Lymph nodes: Examined: 0 Sentinel 13 Non-sentinel 13 Total Lymph nodes with metastasis: 0 Isolated tumor cells (< 0.2 mm): 0 Micrometastasis: (> 0.2 mm and < 2.0 mm): 0 Macrometastasis: (> 2.0 mm): 0 Extracapsular extension: N/A Pelvic lymph nodes: 0 involved of 13 lymph nodes. Para-aortic lymph nodes: No para-aortic nodes submitted TNM code: pT1a, pNX FIGO Stage (based on pathologic findings, needs clinical correlation): IA Comment: Immunohistochemistry for cytokeratin AE1/AE3 is performed on all of the lymph nodes (parts 1 & 2) and no metastatic carcinoma is identified.   07/04/2017 Genetic Testing   The patient had genetic testing due to a personal history of breast and uterine cancer, and a family history of stomach cancer.  The Multi-Cancer Panel was ordered. The Multi-Cancer Panel offered by [facility] includes sequencing and/or deletion duplication testing of the following 83 genes: ALK, APC, ATM, AXIN2,BAP1,  BARD1, BLM, BMPR1A, BRCA1, BRCA2, BRIP1, CASR, CDC73, CDH1, CDK4, CDKN1B, CDKN1C, CDKN2A (p14ARF), CDKN2A (p16INK4a), CEBPA, CHEK2, CTNNA1, DICER1, DIS3L2, EGFR (c.2369C>T, p.Thr790Met variant only), EPCAM (Deletion/duplication testing only), FH, FLCN, GATA2, GPC3, GREM1  (Promoter region deletion/duplication testing only), HOXB13 (c.251G>A, p.Gly84Glu), HRAS, KIT, MAX, MEN1, MET, MITF (c.952G>A, p.Glu318Lys variant only), MLH1, MSH2, MSH3, MSH6, MUTYH, NBN, NF1, NF2, NTHL1, PALB2, PDGFRA, PHOX2B, PMS2, POLD1, POLE, POT1, PRKAR1A, PTCH1, PTEN, RAD50, RAD51C, RAD51D, RB1, RECQL4, RET, RUNX1, SDHAF2, SDHA (sequence changes only), SDHB, SDHC, SDHD, SMAD4, SMARCA4, SMARCB1, SMARCE1, STK11, SUFU, TERC, TERT, TMEM127, TP53, TSC1, TSC2, VHL, WRN and WT1.   Results: No pathogenic variants were identified.  A variant of uncertain significance in the gene APC was identified.  c.791A>G (p.Gln264Arg).  The date of this test report is 07/04/2017.     Genetic Testing   Patient has genetic testing done for MSI. 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    12/03/2017 Imaging   CT Chest/Abd/Pelvis to follow pulmonary nodule and gastric mass IMPRESSION: 1. Stable CT of the chest. Small pulmonary nodules are unchanged when compared with previous exam. 2. No new findings identified. 3. Subcentimeter low-attenuation lesions within the liver are remain too small to characterize but are stable from prior exam. 4. Persistent indeterminate low-attenuation structure within the proximal stomach is unchanged measuring 1.4 cm. Correlation with direct visualization is advised   06/30/2018 Imaging   CT CHEST Lungs/Pleura: Stable scattered sub-cm pulmonary nodules are again seen bilaterally and are stable compared to previous studies. No new or enlarging pulmonary nodules or masses identified. No evidence of pulmonary infiltrate or pleural effusion.   08/19/2018 Echocardiogram   ECHO is done in [place]: EF 60-65%. Mild impaired relaxation. (report scanned)   09/17/2018 Relapse/Recurrence   Presented with c/o vagina discharge.  Lesion noted at the left vaginal apex 6mm lesion removed Path c/w high grade serous cancer   09/17/2018 Pathology Results   Vagina,  biopsy, left apex - HIGH GRADE SEROUS CARCINOMA.   09/28/2018 PET scan   1. Two hypermetabolic axial lymph nodes. Unusual site for metastatic endometrial carcinoma however the activity is more intense than typically seen in reactive adenopathy. Suggest ultrasound-guided percutaneous biopsy  of the larger RIGHT axial lymph node 2. No evidence of local recurrence at the vaginal cuff.  3. No metastatic adenopathy in the abdomen or pelvis. 4. Stable small pulmonary nodules   10/09/2018 Pathology Results   Lymph node, needle/core biopsy, right axilla - METASTATIC CARCINOMA, SEE COMMENT. Microscopic Comment The carcinoma appears high grade. Immunohistochemistry is positive for cytokeratin 7, PAX-8, and ER. Cytokeratin 20, CDX-2, PR, and GATA-3 are negative. The findings along with the history are consistent with a gynecologic primary.    10/09/2018 Procedure   Ultrasound-guided core biopsies of a right axillary lymph node.   10/14/2018 Cancer Staging   Staging form: Corpus Uteri - Carcinoma and Carcinosarcoma, AJCC 8th Edition - Clinical: Stage IVB (cT1, cN0, pM1) - Signed by Lonn Hicks, MD on 10/14/2018   10/19/2018 Imaging   Placement of single lumen port a cath via right internal jugular vein. The catheter tip lies at the cavo-atrial junction. A power injectable port a cath was placed and is ready for immediate use.    10/21/2018 -  Chemotherapy   The patient had carboplatin  and taxol  for treatment   11/11/2018 - 03/12/2022 Chemotherapy   Patient is on Treatment Plan : BREAST Trastuzumab  q21d     12/21/2018 PET scan   1. Interval resolution of hypermetabolic right axillary lymph nodes. No metabolic findings highly suspicious for recurrent metastatic disease. 2. New mild hypermetabolism within a borderline prominent portacaval lymph node, nonspecific. While a reactive node is favored, a lymph node metastasis cannot be entirely excluded. Suggest attention to this lymph node on follow-up PET-CT in 3-6  months. 3. Nonspecific new hypermetabolism at the ileocecal valve, more likely physiologic given absence of CT correlate. 4. Scattered subcentimeter pulmonary nodules are all stable and below PET resolution, more likely benign, continued CT surveillance advised. 5. Chronic findings include: Aortic Atherosclerosis (ICD10-I70.0). Marked diffuse colonic diverticulosis. Coronary atherosclerosis.     12/28/2018 Echocardiogram   1. The left ventricle has normal systolic function with an ejection fraction of 60-65%. The cavity size was normal. Left ventricular diastolic Doppler parameters are consistent with impaired relaxation.  2. GLS recorded as -10.7 but LV appears hyperdynamic and tracking of endocardium appears poor.  3. The right ventricle has normal systolic function. The cavity was normal. There is no increase in right ventricular wall thickness.  4. Mild thickening of the mitral valve leaflet.  5. The aortic valve was not well visualized. Mild thickening of the aortic valve. Aortic valve regurgitation is trivial by color flow Doppler.   03/23/2019 Imaging   PET 1. No findings of hypermetabolic residual/recurrent or metastatic disease. 2. Similar low-level hypermetabolism within normal sized portocaval and right inguinal nodes, favored to be reactive. 3. Ongoing stability of small bilateral pulmonary nodules, favored to be benign. Below PET resolution. 4. Coronary artery atherosclerosis. Aortic Atherosclerosis   03/26/2019 Echocardiogram   1. The left ventricle has normal systolic function, with an ejection fraction of 55-60%. The cavity size was normal. Left ventricular diastolic Doppler parameters are consistent with impaired relaxation.  2. The right ventricle has normal systolic function. The cavity was normal.  3. The mitral valve is abnormal. Mild thickening of the mitral valve leaflet. There is mild mitral annular calcification present.  4. The tricuspid valve is grossly normal.  5.  The aortic valve is tricuspid. Mild calcification of the aortic valve. No stenosis of the aortic valve.  6. The aorta is normal unless otherwise noted.  7. Normal LV systolic function; grade 1 diastolic dysfunction;  GLS-15.2%.   06/30/2019 Imaging   Chest Impression:   1. No evidence thoracic metastasis. 2. Stable small benign-appearing pulmonary nodules   Abdomen / Pelvis Impression:   1. No evidence of metastatic disease in the abdomen pelvis. 2.  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 3.  Aortic Atherosclerosis (ICD10-I70.0).   06/30/2019 Echocardiogram    1. Left ventricular ejection fraction, by visual estimation, is 60 to 65%. The left ventricle has normal function. There is no left ventricular hypertrophy.  2. Left ventricular diastolic parameters are consistent with Grade I diastolic dysfunction (impaired relaxation).  3. The left ventricle has no regional wall motion abnormalities.  4. Global right ventricle has normal systolic function.The right ventricular size is normal. No increase in right ventricular wall thickness.  5. Left atrial size was mild-moderately dilated.  6. Right atrial size was normal.  7. The mitral valve is normal in structure. Trivial mitral valve regurgitation.  8. The tricuspid valve is normal in structure. Tricuspid valve regurgitation is trivial.  9. The aortic valve is normal in structure. Aortic valve regurgitation is trivial. No evidence of aortic valve sclerosis or stenosis. 10. The pulmonic valve was grossly normal. Pulmonic valve regurgitation is not visualized. 11. Mildly elevated pulmonary artery systolic pressure. 12. The inferior vena cava is normal in size with greater than 50% respiratory variability, suggesting right atrial pressure of 3 mmHg. 13. The average left ventricular global longitudinal strain is -12.5 %. GLS underestimated due to poor endocardial tracking.     09/27/2019 Imaging   1. Multiple small, nonspecific  pulmonary nodules are again noted. The previously noted lung nodules are unchanged in size from previous exam. There is a new lung nodule within the medial right lower lobe which is nonspecific measure 4 mm. Attention at follow-up imaging is recommended. 2. No evidence of metastatic disease within the abdomen or pelvis. 3. Coronary artery calcifications.  The 4.  Aortic Atherosclerosis (ICD10-I70.0).   09/30/2019 Echocardiogram    1. Left ventricular ejection fraction, by estimation, is 60 to 65%. The left ventricle has normal function. The left ventricle has no regional wall motion abnormalities. Left ventricular diastolic parameters are consistent with Grade I diastolic dysfunction (impaired relaxation).  2. Right ventricular systolic function is normal. The right ventricular size is normal. There is normal pulmonary artery systolic pressure. The estimated right ventricular systolic pressure is 24.2 mmHg.  3. The mitral valve is normal in structure. No evidence of mitral valve regurgitation. No evidence of mitral stenosis.  4. The aortic valve is normal in structure. Aortic valve regurgitation is trivial. No aortic stenosis is present.  5. The inferior vena cava is normal in size with greater than 50% respiratory variability, suggesting right atrial pressure of 3 mmHg.     01/07/2020 Echocardiogram    1. Normal LV function; grade 1 diastolic dysfunction; mild AI; GLS -21%.  2. Left ventricular ejection fraction, by estimation, is 55 to 60%. The left ventricle has normal function. The left ventricle has no regional wall motion abnormalities. Left ventricular diastolic parameters are consistent with Grade I diastolic dysfunction (impaired relaxation).  3. Right ventricular systolic function is normal. The right ventricular size is normal.  4. The mitral valve is normal in structure. Trivial mitral valve regurgitation. No evidence of mitral stenosis.  5. The aortic valve is tricuspid. Aortic valve  regurgitation is mild. Mild aortic valve sclerosis is present, with no evidence of aortic valve stenosis.  6. The inferior vena cava is normal in size with  greater than 50% respiratory variability, suggesting right atrial pressure of 3 mmHg.     03/27/2020 Imaging   1. Status post hysterectomy. No evidence of recurrent or metastatic disease in the abdomen or pelvis. 2. Multiple small pulmonary nodules in the lung bases are unchanged to the extent that they are included on current examination and remain most likely sequelae of prior infection or inflammation. Attention on follow-up. 3. Aortic Atherosclerosis (ICD10-I70.0).   04/17/2020 Echocardiogram   1. Normal LVEF, normal and unchanged GLS: -20.2%.  2. Left ventricular ejection fraction, by estimation, is 60 to 65%. The left ventricle has normal function. The left ventricle has no regional wall motion abnormalities. There is mild concentric left ventricular hypertrophy. Left ventricular diastolic parameters are consistent with Grade I diastolic dysfunction (impaired relaxation). The average left ventricular global longitudinal strain is -20.2 %. The global longitudinal strain is normal.  3. Right ventricular systolic function is normal. The right ventricular size is normal.  4. The mitral valve is normal in structure. Mild mitral valve regurgitation. No evidence of mitral stenosis.  5. The aortic valve is normal in structure. There is mild calcification of the aortic valve. There is mild thickening of the aortic valve. Aortic valve regurgitation is mild. No aortic stenosis is present.  6. The inferior vena cava is normal in size with greater than 50% respiratory variability, suggesting right atrial pressure of 3 mmHg.   08/02/2020 Echocardiogram   1. Left ventricular ejection fraction, by estimation, is 55 to 60%. The left ventricle has normal function. The left ventricle has no regional wall motion abnormalities. Left ventricular diastolic  parameters are consistent with Grade I diastolic dysfunction (impaired relaxation). The average left ventricular global longitudinal strain is -27.0 %. The global longitudinal strain is normal.  2. Right ventricular systolic function is normal. The right ventricular size is normal. Tricuspid regurgitation signal is inadequate for assessing PA pressure.  3. The mitral valve is grossly normal. Trivial mitral valve regurgitation. No evidence of mitral stenosis.  4. The aortic valve is tricuspid. Aortic valve regurgitation is mild. No aortic stenosis is present.  5. The inferior vena cava is normal in size with greater than 50% respiratory variability, suggesting right atrial pressure of 3 mmHg.   09/19/2020 Imaging   1. Status post hysterectomy. No evidence of recurrent or new metastatic disease within the chest, abdomen, or pelvis. 2. Scattered bilateral tiny pulmonary nodules, unchanged from prior studies and most likely sequela of prior infection or inflammation, recommend attention on follow-up imaging. No new suspicious pulmonary nodules or masses. 3. Subcutaneous edema and small hematoma overlying the posterior gluteal musculature, recommend correlation with recent history of trauma. 4. Mild low-density wall thickening of the gastric antrum, which may represent gastritis. 5. Hepatic steatosis. 6. Extensive sigmoid colonic diverticulosis without findings of acute diverticulitis. 7. Aortic atherosclerosis.  Aortic Atherosclerosis (ICD10-I70.0).   11/20/2020 Echocardiogram    1. Compared to echo from Jan 2022, Global longitudinal strain is less negative. (Previously -27%). Left ventricular ejection fraction, by estimation, is 60 to 65%. The left ventricle has normal function. The left ventricle has no regional wall motion abnormalities. There is mild left ventricular hypertrophy. Left ventricular diastolic parameters are indeterminate.  2. Right ventricular systolic function is normal. The right  ventricular size is normal.  3. The mitral valve is normal in structure. Trivial mitral valve regurgitation.  4. Aortic valve regurgitation is mild. Mild aortic valve sclerosis is present, with no evidence of aortic valve stenosis.  03/12/2021 Echocardiogram   1. Left ventricular ejection fraction, by estimation, is 60 to 65%. Left ventricular ejection fraction by 2D MOD biplane is 63.6 %. The left ventricle has normal function. The left ventricle has no regional wall motion abnormalities. There is mild left ventricular hypertrophy. Left ventricular diastolic parameters are consistent with Grade I diastolic dysfunction (impaired relaxation). The average left ventricular global longitudinal strain is -20.0 %. The global longitudinal strain is normal.  2. Right ventricular systolic function is normal. The right ventricular size is normal. There is normal pulmonary artery systolic pressure. The estimated right ventricular systolic pressure is 20.0 mmHg.  3. The mitral valve is normal in structure. No evidence of mitral valve regurgitation. No evidence of mitral stenosis.  4. The aortic valve is tricuspid. Aortic valve regurgitation is trivial. No aortic stenosis is present. Aortic regurgitation PHT measures 403 msec.  5. The inferior vena cava is normal in size with greater than 50% respiratory variability, suggesting right atrial pressure of 3 mmHg.   04/02/2021 Imaging   Stable small pulmonary nodules scattered throughout the chest, unchanged, without new or suspicious pulmonary nodule.   Three-vessel coronary artery disease.   Aortic Atherosclerosis (ICD10-I70.0).   06/06/2021 Imaging    1. Left ventricular ejection fraction, by estimation, is 60 to 65%. The left ventricle has normal function. The left ventricle has no regional wall motion abnormalities. Left ventricular diastolic parameters are consistent with Grade I diastolic dysfunction (impaired relaxation).  2. Right ventricular systolic  function is normal. The right ventricular size is normal. There is normal pulmonary artery systolic pressure. The estimated right ventricular systolic pressure is 28.6 mmHg.  3. Left atrial size was mildly dilated.  4. The mitral valve is normal in structure. Trivial mitral valve regurgitation.  5. The aortic valve is tricuspid. Aortic valve regurgitation is mild. No aortic stenosis is present.  6. The inferior vena cava is normal in size with greater than 50% respiratory variability, suggesting right atrial pressure of 3 mmHg.  7. GLS attempted but not reported due to suboptimal tracking.     09/25/2021 Echocardiogram    1. Left ventricular ejection fraction, by estimation, is 60 to 65%. Left ventricular ejection fraction by 3D volume is 56 %. The left ventricle has normal function. The left ventricle has no regional wall motion abnormalities. Left ventricular diastolic parameters were normal.  2. Right ventricular systolic function is normal. The right ventricular size is normal. There is normal pulmonary artery systolic pressure.  3. The mitral valve is normal in structure. Trivial mitral valve regurgitation. No evidence of mitral stenosis.  4. The aortic valve is tricuspid. Aortic valve regurgitation is mild. No aortic stenosis is present.  5. The inferior vena cava is normal in size with greater than 50% respiratory variability, suggesting right atrial pressure of 3 mmHg.   10/15/2021 Imaging   1. Stable examination post prior hysterectomy without evidence of local recurrence or metastatic disease within the chest, abdomen, or pelvis. 2. Unchanged small bilateral pulmonary nodules, likely benign. 3. Left-sided colonic diverticulosis without findings of acute diverticulitis. 4. Aortic Atherosclerosis (ICD10-I70.0).       12/31/2021 Echocardiogram    1. Left ventricular ejection fraction, by estimation, is 65 to 70%. The left ventricle has normal function. The left ventricle has no regional  wall motion abnormalities. Left ventricular diastolic parameters are consistent with Grade I diastolic dysfunction (impaired relaxation).  2. Right ventricular systolic function is normal. The right ventricular size is normal. There is normal pulmonary artery  systolic pressure. The estimated right ventricular systolic pressure is 27.4 mmHg.  3. The mitral valve is grossly normal. Trivial mitral valve regurgitation. No evidence of mitral stenosis.  4. The aortic valve is tricuspid. Aortic valve regurgitation is mild. No aortic stenosis is present.  5. The inferior vena cava is normal in size with greater than 50% respiratory variability, suggesting right atrial pressure of 3 mmHg.     04/02/2022 -  Chemotherapy   Patient is on Treatment Plan : BREAST MAINTENANCE Trastuzumab  IV (6) or SQ (600) D1 q21d X 11 Cycles     04/04/2022 Echocardiogram    1. Left ventricular ejection fraction, by estimation, is 65 to 70%. The left ventricle has normal function. The left ventricle has no regional wall motion abnormalities. Left ventricular diastolic parameters are consistent with Grade I diastolic dysfunction (impaired relaxation).  2. Right ventricular systolic function is normal. The right ventricular size is normal.  3. Left atrial size was mildly dilated.  4. The mitral valve is normal in structure. Trivial mitral valve regurgitation. No evidence of mitral stenosis.  5. The aortic valve is tricuspid. There is mild calcification of the aortic valve. Aortic valve regurgitation is mild. No aortic stenosis is present. Aortic regurgitation PHT measures 613 msec. Aortic valve area, by VTI measures 1.70 cm. Aortic valve mean gradient measures 3.0 mmHg. Aortic valve Vmax measures 1.15 m/s.  6. The inferior vena cava is normal in size with greater than 50% respiratory variability, suggesting right atrial pressure of 3 mmHg.   07/02/2022 Echocardiogram   1. Left ventricular ejection fraction, by estimation, is 60 to  65%. Left ventricular ejection fraction by 3D volume is 58 %. The left ventricle has normal function. The left ventricle has no regional wall motion abnormalities. Left ventricular diastolic parameters are consistent with Grade I diastolic dysfunction (impaired relaxation). The average left ventricular global longitudinal strain is -20.7 %. The global longitudinal strain is normal.  2. Right ventricular systolic function is normal. The right ventricular size is normal. Tricuspid regurgitation signal is inadequate for assessing PA pressure.  3. Left atrial size was mildly dilated.  4. The mitral valve is normal in structure. Trivial mitral valve regurgitation. No evidence of mitral stenosis.  5. The aortic valve is grossly normal. Aortic valve regurgitation is mild. No aortic stenosis is present.  6. The inferior vena cava is normal in size with greater than 50% respiratory variability, suggesting right atrial pressure of 3 mmHg.   11/05/2022 Echocardiogram       1. Left ventricular ejection fraction, by estimation, is 55 to 60%. Left ventricular ejection fraction by 3D volume is 59 %. The left ventricle has normal function. The left ventricle has no regional wall motion abnormalities. Left ventricular diastolic  parameters are consistent with Grade I diastolic dysfunction (impaired relaxation). The average left ventricular global longitudinal strain is -16.8 %. The global longitudinal strain is normal.  2. Right ventricular systolic function is normal. The right ventricular size is normal. There is normal pulmonary artery systolic pressure.  3. The mitral valve is normal in structure. No evidence of mitral valve regurgitation. No evidence of mitral stenosis.  4. The aortic valve is normal in structure. Aortic valve regurgitation is mild. No aortic stenosis is present.  5. The inferior vena cava is normal in size with greater than 50% respiratory variability, suggesting right atrial pressure of 3  mmHg.     11/07/2022 Imaging   CT CHEST ABDOMEN PELVIS W CONTRAST  Result Date: 11/07/2022  CLINICAL DATA:  Cervical cancer. Assess treatment response. On chemotherapy * Tracking Code: BO * EXAM: CT CHEST, ABDOMEN, AND PELVIS WITH CONTRAST TECHNIQUE: Multidetector CT imaging of the chest, abdomen and pelvis was performed following the standard protocol during bolus administration of intravenous contrast. RADIATION DOSE REDUCTION: This exam was performed according to the departmental dose-optimization program which includes automated exposure control, adjustment of the mA and/or kV according to patient size and/or use of iterative reconstruction technique. CONTRAST:  OMNIPAQUE  IOHEXOL  300 MG/ML  SOLN COMPARISON:  CT 10/15/2021 and older FINDINGS: CT CHEST FINDINGS Cardiovascular: Right upper chest port. Heart is nonenlarged. No pericardial effusion. The thoracic aorta has a normal course and caliber with scattered vascular calcifications. There is a bovine type aortic arch, a normal variant. Mediastinum/Nodes: Heterogeneous small thyroid  gland with a nodular area in the right is unchanged from previous. As stated previously, no specific imaging follow-up. No specific abnormal lymph node enlargement seen in the axillary regions, hilum or mediastinum. Surgical clips in the left axillary region. Breast implants are noted. Normal caliber thoracic esophagus. Lungs/Pleura: There is some linear opacity lung bases likely scar or atelectasis. No pleural effusion. No consolidation or pneumothorax. There are a few scattered tiny lung nodules identified. Example 3 mm superior segment left lower lobe on series 6, image 49 which is stable. Lingular nodule measuring 4 mm on the prior, today is stable on series 6, image 73. 3 mm nodule along the margin of the minor fissure on the right is also stable. There is a larger ill-defined areas seen right lung on the prior examination series 4, image 63 which has resolved. This  may have been infectious or inflammatory. There is a small area of bronchiectasis in this location today on series 6, image 58. No new dominant lung nodule. Musculoskeletal: Diffuse moderate degenerative changes of the spine. CT ABDOMEN PELVIS FINDINGS Hepatobiliary: Fatty liver infiltration. There are some tiny low-attenuation lesion seen in the liver which are too small to completely characterize but unchanged from previous. Simple attention on follow-up for the patient's neoplasm. Example series 2, image 44 anterior to branches of the right hepatic vein. Gallbladder is nondilated. Patent portal vein. Pancreas: Unremarkable. No pancreatic ductal dilatation or surrounding inflammatory changes. Spleen: Normal in size without focal abnormality. Adrenals/Urinary Tract: The adrenal glands are preserved. No enhancing renal mass. There are some tiny low-attenuation lesion seen along each kidney which are too small to completely characterize likely small cysts. No specific imaging follow-up. Bosniak 2 lesions. Contracted urinary bladder. Stomach/Bowel: Large bowel has extensive left-sided colonic stool. Extensive colonic diverticulosis as well. The large bowel is nondilated. Small bowel is nondilated. There is a nodular area along the upper aspect of the stomach fold thickening. On series 2, image 45 this measures 18 by 14 mm. This is not well seen previously but on the prior study the stomach is more collapsed. There may have been something in retrospect in this location. There also is some nodular areas along the antrum. Recommend further evaluation to exclude underlying lesion. Vascular/Lymphatic: Normal caliber aorta and IVC with scattered vascular calcifications. No specific abnormal lymph node enlargement identified in the abdomen and pelvis. Reproductive: Status post hysterectomy. No adnexal masses. Other: No free air or free fluid. Musculoskeletal: Moderate degenerative changes of the spine and pelvis. Trace  anterolisthesis at L4-5 and L5-S1. 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 IMPRESSION: Stable tiny multiple pulmonary nodules. 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 No developing lymph node enlargement or fluid  collection. Colonic diverticulosis of the sigmoid colon with moderate stool. There are nodular masslike areas seen in the stomach including towards the fundus and small area towards the antrum with fold thickening. Hyperplastic polyps are possible. Recommend further workup of the stomach when appropriate to exclude an aggressive process. Electronically Signed   By: Ranell Bring M.D.   On: 11/07/2022 13:39   ECHOCARDIOGRAM COMPLETE  Result Date: 11/05/2022    ECHOCARDIOGRAM REPORT   Patient Name:   Vicki Cervantes Date of Exam: 11/05/2022 Medical Rec #:  [id number]      Height:       62.0 in Accession #:    [id number]     Weight:       134.8 lb Date of Birth:  1940-11-19      BSA:          1.617 m Patient Age:    81 years       BP:           144/72 mmHg Patient Gender: F              HR:           69 bpm. Exam Location:  Outpatient Procedure: 2D Echo, 3D Echo, Cardiac Doppler, Color Doppler and Strain Analysis Indications:    chemo  History:        Patient has prior history of Echocardiogram examinations. Risk                 Factors:Hypertension.  Sonographer:    Morna Luis Referring Phys: [id number] NI GORSUCH IMPRESSIONS  1. Left ventricular ejection fraction, by estimation, is 55 to 60%. Left ventricular ejection fraction by 3D volume is 59 %. The left ventricle has normal function. The left ventricle has no regional wall motion abnormalities. Left ventricular diastolic  parameters are consistent with Grade I diastolic dysfunction (impaired relaxation). The average left ventricular global longitudinal strain is -16.8 %. The global longitudinal strain is normal.  2. Right ventricular systolic function is normal. The right ventricular size is normal. There is normal pulmonary  artery systolic pressure.  3. The mitral valve is normal in structure. No evidence of mitral valve regurgitation. No evidence of mitral stenosis.  4. The aortic valve is normal in structure. Aortic valve regurgitation is mild. No aortic stenosis is present.  5. The inferior vena cava is normal in size with greater than 50% respiratory variability, suggesting right atrial pressure of 3 mmHg. Comparison(s): Prior 07/01/2022: EF 60%, GLS -20.7% Prior 03/2022: GLS-15.3. FINDINGS  Left Ventricle: Left ventricular ejection fraction, by estimation, is 55 to 60%. Left ventricular ejection fraction by 3D volume is 59 %. The left ventricle has normal function. The left ventricle has no regional wall motion abnormalities. The average left ventricular global longitudinal strain is -16.8 %. The global longitudinal strain is normal. The left ventricular internal cavity size was normal in size. There is no left ventricular hypertrophy. Left ventricular diastolic parameters are consistent  with Grade I diastolic dysfunction (impaired relaxation). Right Ventricle: The right ventricular size is normal. No increase in right ventricular wall thickness. Right ventricular systolic function is normal. There is normal pulmonary artery systolic pressure. The tricuspid regurgitant velocity is 1.79 m/s, and  with an assumed right atrial pressure of 3 mmHg, the estimated right ventricular systolic pressure is 15.8 mmHg. Left Atrium: Left atrial size was normal in size. Right Atrium: Right atrial size was normal in size. Pericardium: There is no evidence of pericardial effusion. Mitral  Valve: The mitral valve is normal in structure. No evidence of mitral valve regurgitation. No evidence of mitral valve stenosis. Tricuspid Valve: The tricuspid valve is normal in structure. Tricuspid valve regurgitation is not demonstrated. No evidence of tricuspid stenosis. Aortic Valve: The aortic valve is normal in structure. Aortic valve regurgitation is mild.  Aortic regurgitation PHT measures 497 msec. No aortic stenosis is present. Pulmonic Valve: The pulmonic valve was normal in structure. Pulmonic valve regurgitation is mild. No evidence of pulmonic stenosis. Aorta: The aortic root is normal in size and structure. Venous: The inferior vena cava is normal in size with greater than 50% respiratory variability, suggesting right atrial pressure of 3 mmHg. IAS/Shunts: No atrial level shunt detected by color flow Doppler.  LEFT VENTRICLE PLAX 2D LVIDd:         3.80 cm         Diastology LVIDs:         2.50 cm         LV e' medial:    7.51 cm/s LV PW:         1.00 cm         LV E/e' medial:  7.0 LV IVS:        1.00 cm         LV e' lateral:   8.16 cm/s LVOT diam:     1.90 cm         LV E/e' lateral: 6.4 LV SV:         49 LV SV Index:   30              2D LVOT Area:     2.84 cm        Longitudinal                                Strain                                2D Strain GLS  -16.8 % LV Volumes (MOD)               Avg: LV vol d, MOD    62.3 ml A2C:                           3D Volume EF LV vol d, MOD    68.5 ml       LV 3D EF:    Left A4C:                                        ventricul LV vol s, MOD    25.6 ml                    ar A2C:                                        ejection LV vol s, MOD    24.1 ml                    fraction A4C:  by 3D LV SV MOD A2C:   36.7 ml                    volume is LV SV MOD A4C:   68.5 ml                    59 %. LV SV MOD BP:    39.9 ml                                 3D Volume EF:                                3D EF:        59 %                                LV EDV:       116 ml                                LV ESV:       48 ml                                LV SV:        68 ml RIGHT VENTRICLE             IVC RV Basal diam:  3.00 cm     IVC diam: 1.40 cm RV S prime:     10.20 cm/s TAPSE (M-mode): 1.9 cm LEFT ATRIUM             Index        RIGHT ATRIUM           Index LA diam:        3.40  cm 2.10 cm/m   RA Area:     11.20 cm LA Vol (A2C):   49.4 ml 30.56 ml/m  RA Volume:   22.50 ml  13.92 ml/m LA Vol (A4C):   28.3 ml 17.51 ml/m LA Biplane Vol: 38.6 ml 23.88 ml/m  AORTIC VALVE LVOT Vmax:   79.80 cm/s LVOT Vmean:  49.400 cm/s LVOT VTI:    0.173 m AI PHT:      497 msec  AORTA Ao Root diam: 3.20 cm MITRAL VALVE               TRICUSPID VALVE MV Area (PHT): 2.84 cm    TR Peak grad:   12.8 mmHg MV Decel Time: 267 msec    TR Vmax:        179.00 cm/s MV E velocity: 52.40 cm/s MV A velocity: 69.80 cm/s  SHUNTS MV E/A ratio:  0.75        Systemic VTI:  0.17 m                            Systemic Diam: 1.90 cm Oneil Parchment MD Electronically signed by Oneil Parchment MD Signature Date/Time: 11/05/2022/2:11:35 PM    Final       02/17/2023 Echocardiogram   ECHOCARDIOGRAM COMPLETE  Result Date: 02/17/2023    ECHOCARDIOGRAM REPORT   Patient Name:   Vicki [facility]  A Cervantes Date of Exam: 02/17/2023 Medical Rec #:  [id number]      Height:       62.0 in Accession #:    [id number]     Weight:       137.4 lb Date of Birth:  [birth year]      BSA:          1.630 m Patient Age:    82 years       BP:           143/77 mmHg Patient Gender: F              HR:           71 bpm. Exam Location:  Outpatient Procedure: 2D Echo, Strain Analysis, Color Doppler and Cardiac Doppler Indications:    Chemo  History:        Patient has prior history of Echocardiogram examinations, most                 recent 11/05/2022. Risk Factors:Hypertension.  Referring Phys: [id number] NI GORSUCH IMPRESSIONS  1. Left ventricular ejection fraction, by estimation, is 60 to 65%. The left ventricle has normal function. The left ventricle has no regional wall motion abnormalities. Left ventricular diastolic parameters are consistent with Grade I diastolic dysfunction (impaired relaxation).  2. Right ventricular systolic function is normal. The right ventricular size is normal.  3. The mitral valve is normal in structure. Trivial mitral valve regurgitation.  4. The aortic  valve was not well visualized. Aortic valve regurgitation is not visualized.  5. The inferior vena cava is normal in size with greater than 50% respiratory variability, suggesting right atrial pressure of 3 mmHg. FINDINGS  Left Ventricle: Left ventricular ejection fraction, by estimation, is 60 to 65%. The left ventricle has normal function. The left ventricle has no regional wall motion abnormalities. The left ventricular internal cavity size was normal in size. There is  no left ventricular hypertrophy. Left ventricular diastolic parameters are consistent with Grade I diastolic dysfunction (impaired relaxation). Right Ventricle: The right ventricular size is normal. Right ventricular systolic function is normal. Left Atrium: Left atrial size was normal in size. Right Atrium: Right atrial size was normal in size. Pericardium: There is no evidence of pericardial effusion. Mitral Valve: The mitral valve is normal in structure. Trivial mitral valve regurgitation. Tricuspid Valve: The tricuspid valve is normal in structure. Tricuspid valve regurgitation is not demonstrated. Aortic Valve: The aortic valve was not well visualized. Aortic valve regurgitation is not visualized. Pulmonic Valve: Pulmonic valve regurgitation is not visualized. Aorta: The aortic root and ascending aorta are structurally normal, with no evidence of dilitation. Venous: The inferior vena cava is normal in size with greater than 50% respiratory variability, suggesting right atrial pressure of 3 mmHg. IAS/Shunts: No atrial level shunt detected by color flow Doppler.  LEFT VENTRICLE PLAX 2D LVIDd:         4.50 cm   Diastology LVIDs:         3.10 cm   LV e' medial:    5.33 cm/s LV PW:         0.90 cm   LV E/e' medial:  13.3 LV IVS:        0.90 cm   LV e' lateral:   7.07 cm/s LVOT diam:     2.00 cm   LV E/e' lateral: 10.1 LV SV:         44 LV SV Index:   27 LVOT Area:  3.14 cm  RIGHT VENTRICLE            IVC RV S prime:     8.59 cm/s  IVC diam:  0.70 cm TAPSE (M-mode): 1.7 cm LEFT ATRIUM             Index        RIGHT ATRIUM           Index LA Vol (A2C):   43.9 ml 26.94 ml/m  RA Area:     11.00 cm LA Vol (A4C):   35.5 ml 21.78 ml/m  RA Volume:   21.50 ml  13.19 ml/m LA Biplane Vol: 41.6 ml 25.53 ml/m  AORTIC VALVE LVOT Vmax:   59.60 cm/s LVOT Vmean:  41.100 cm/s LVOT VTI:    0.139 m MITRAL VALVE MV Area (PHT): 4.57 cm    SHUNTS MV Decel Time: 166 msec    Systemic VTI:  0.14 m MV E velocity: 71.10 cm/s  Systemic Diam: 2.00 cm MV A velocity: 78.40 cm/s MV E/A ratio:  0.91 Ronal Ross Electronically signed by Ronal Ross Signature Date/Time: 02/17/2023/12:11:53 PM    Final       06/27/2023 Echocardiogram       1. Left ventricular ejection fraction, by estimation, is 60 to 65%. The left ventricle has normal function. The left ventricle has no regional wall motion abnormalities. Left ventricular diastolic parameters were normal. The average left ventricular  global longitudinal strain is -16.5 %. The global longitudinal strain is normal.  2. Right ventricular systolic function is normal. The right ventricular size is normal.  3. The mitral valve is abnormal. No evidence of mitral valve regurgitation. No evidence of mitral stenosis.  4. The aortic valve is tricuspid. There is moderate calcification of the aortic valve. There is moderate thickening of the aortic valve. Aortic valve regurgitation is trivial. Aortic valve sclerosis is present, with no evidence of aortic valve stenosis.  5. The inferior vena cava is normal in size with greater than 50% respiratory variability, suggesting right atrial pressure of 3 mmHg.   10/15/2023 Imaging   1. Stable examination without evidence of new or progressive disease in the chest, abdomen or pelvis. 2. Stable small bilateral pulmonary nodules and too small to accurately characterize hepatic lesions. Suggest continued attention on follow-up imaging. 3. Stable mass like intramural area in the gastric fundus,  nonspecific suggest further evaluation with upper endoscopy if not previously performed 4. Colonic diverticulosis. 5. Aortic atherosclerosis   11/24/2023 Echocardiogram   1. Left ventricular ejection fraction, by estimation, is 55 to 60%. The left ventricle has normal function. The left ventricle has no regional wall motion abnormalities. Left ventricular diastolic parameters are indeterminate. The average left ventricular global longitudinal strain is -19.4 %. The global longitudinal strain is normal.  2. Right ventricular systolic function is normal. The right ventricular size is normal. Tricuspid regurgitation signal is inadequate for assessing PA pressure.  3. The mitral valve is normal in structure. Trivial mitral valve regurgitation. No evidence of mitral stenosis.  4. The aortic valve is tricuspid. Aortic valve regurgitation is mild. Aortic valve sclerosis is present, with no evidence of aortic valve stenosis.  5. The inferior vena cava is normal in size with greater than 50% respiratory variability, suggesting right atrial pressure of 3 mmHg.   Comparison(s): No significant change from prior study.     03/30/2024 Echocardiogram   1. Left ventricular ejection fraction, by estimation, is 55 to 60%. Left ventricular ejection fraction by 3D volume  is 58 %. The left ventricle has normal function. The left ventricle has no regional wall motion abnormalities. There is mild left ventricular hypertrophy. Left ventricular diastolic parameters are consistent with Grade I diastolic dysfunction (impaired relaxation).  2. Right ventricular systolic function is normal. The right ventricular size is normal. Tricuspid regurgitation signal is inadequate for assessing PA pressure.  3. The mitral valve is normal in structure. Trivial mitral valve regurgitation. No evidence of mitral stenosis.  4. The aortic valve is tricuspid. Aortic valve regurgitation is mild. Aortic valve sclerosis is present, with no  evidence of aortic valve stenosis.  5. The inferior vena cava is normal in size with greater than 50% respiratory variability, suggesting right atrial pressure of 3 mmHg.   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/14/2018 Initial Diagnosis   Metastasis to lymph nodes (HCC)   10/21/2018 - 02/24/2019 Chemotherapy   The patient had palonosetron  (ALOXI ) injection 0.25 mg, 0.25 mg, Intravenous,  Once, 7 of 7 cycles Administration: 0.25 mg (10/21/2018), 0.25 mg (11/11/2018), 0.25 mg (12/02/2018), 0.25 mg (12/23/2018), 0.25 mg (01/13/2019), 0.25 mg (02/03/2019), 0.25 mg (02/24/2019) CARBOplatin  (PARAPLATIN ) 310 mg in sodium chloride  0.9 % 250 mL chemo infusion, 310 mg, Intravenous,  Once, 7 of 7 cycles Dose modification: 300 mg (original dose 311.5 mg, Cycle 4, Reason: Dose not tolerated) Administration: 310 mg (10/21/2018), 300 mg (11/11/2018), 300 mg (12/02/2018), 300 mg (12/23/2018), 300 mg (01/13/2019), 300 mg (02/03/2019), 300 mg (02/24/2019) PACLitaxel  (TAXOL ) 222 mg in sodium chloride  0.9 % 250 mL chemo infusion (> 80mg /m2), 135 mg/m2 = 222 mg, Intravenous,  Once, 7 of 7 cycles Administration: 222 mg (10/21/2018), 222 mg (11/11/2018), 222 mg (12/02/2018), 222 mg (12/23/2018), 222 mg (01/13/2019), 222 mg (02/03/2019), 222 mg (02/24/2019) fosaprepitant  (EMEND ) 150 mg, dexamethasone  (DECADRON ) 12 mg in sodium chloride  0.9 % 145 mL IVPB, , Intravenous,  Once, 7 of 7 cycles Administration:  (10/21/2018),  (11/11/2018),  (12/02/2018),  (12/23/2018),  (01/13/2019),  (02/03/2019),  (02/24/2019)  for chemotherapy treatment.    04/02/2022 -  Chemotherapy   Patient is on Treatment Plan : BREAST MAINTENANCE Trastuzumab  IV (6) or SQ (600) D1 q21d X 11 Cycles       Interval History: Denies vaginal bleeding.  Tolerating maintenance treatment without any issues.  Reports baseline bowel bladder function.  Denies any pain.  Past Medical/Surgical History: Past Medical History:  Diagnosis Date   Aortic atherosclerosis    Arthritis    Blood  transfusion without reported diagnosis    Breast cancer (HCC)     LEFT, '82-left breast cancer-surgery only   Cataract    removed both eyes   Complication of anesthesia    nausea   Diverticulosis    Esophageal stricture    Family history of stomach cancer    GERD (gastroesophageal reflux disease)    Hemorrhoids    Hiatal hernia    High triglycerides    Hypercholesteremia    Hypertension    Hypothyroidism    Osteoarthritis    PONV (postoperative nausea and vomiting)    Uterine cancer (HCC)     Past Surgical History:  Procedure Laterality Date   COLONOSCOPY     EUS N/A 01/07/2013   Procedure: UPPER ENDOSCOPIC ULTRASOUND (EUS) LINEAR;  Surgeon: Toribio SHAUNNA Cedar, MD;  Location: [facility] ENDOSCOPY;  Service: Endoscopy;  Laterality: N/A;   EUS N/A 01/05/2015   Procedure: UPPER ENDOSCOPIC ULTRASOUND (EUS) LINEAR;  Surgeon: Toribio SHAUNNA Cedar, MD;  Location: [facility] ENDOSCOPY;  Service: Endoscopy;  Laterality: N/A;  FOOT SURGERY Left 2009-2010   x2. Surgery in 2011   IR IMAGING GUIDED PORT INSERTION  10/19/2018   LEFT HEART CATHETERIZATION WITH CORONARY ANGIOGRAM N/A 01/24/2014   Procedure: LEFT HEART CATHETERIZATION WITH CORONARY ANGIOGRAM;  Surgeon: Peter M Jordan, MD;  Location: [facility] C[facility] LAB;  Service: Cardiovascular;  Laterality: N/A;   LUMBAR DISC SURGERY  01-01-13   MASTECTOMY, RADICAL Left 1982   OVARY SURGERY  1972-1973   ROBOTIC ASSISTED TOTAL HYSTERECTOMY WITH BILATERAL SALPINGO OOPHERECTOMY N/A 10/22/2016   Procedure: XI ROBOTIC ASSISTED TOTAL HYSTERECTOMY WITH RIGHT SALPINGO OOPHORECTOMY; INJECTION OF GREEN DYE WITH EXCISION OF BILATERAL PELVIC LYMPH NODES;  Surgeon: Sari Bachelor, MD;  Location: [facility];  Service: Gynecology;  Laterality: N/A;   ROTATOR CUFF REPAIR Left    SENTINEL NODE BIOPSY N/A 10/22/2016   Procedure: SENTINEL NODE BIOPSY;  Surgeon: Sari Bachelor, MD;  Location: [facility];  Service: Gynecology;  Laterality: N/A;   SIMPLE MASTECTOMY Left 1982   TOTAL KNEE ARTHROPLASTY  Bilateral 2009-2010   x2   UPPER GASTROINTESTINAL ENDOSCOPY      Family History  Problem Relation Age of Onset   Heart disease Mother    Other Mother        blood disease   Heart attack Father    Colon polyps Sister    Heart attack Brother    Colon polyps Brother    Stomach cancer Maternal Aunt    Colon cancer Neg Hx    Esophageal cancer Neg Hx    Rectal cancer Neg Hx    Pancreatic cancer Neg Hx     Social History   Socioeconomic History   Marital status: Widowed    Spouse name: Not on file   Number of children: 3   Years of education: Not on file   Highest education level: Not on file  Occupational History   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   Drug use: No   Sexual activity: Never  Other Topics Concern   Not on file  Social History Narrative   Not on file   Social Drivers of Health   Tobacco Use: Low Risk (07/02/2024)   Patient History    Smoking Tobacco Use: Never    Smokeless Tobacco Use: Never    Passive Exposure: Not on file  Financial Resource Strain: Not on file  Food Insecurity: Not on file  Transportation Needs: Not on file  Physical Activity: Not on file  Stress: Not on file  Social Connections: Not on file  Depression (PHQ2-9): Low Risk (06/22/2024)   Depression (PHQ2-9)    PHQ-2 Score: 0  Alcohol  Screen: Not on file  Housing: Not on file  Utilities: Not on file  Health Literacy: Not on file    Current Medications: Current Medications[1]  Review of Systems: Denies appetite changes, fevers, chills, fatigue, unexplained weight changes. Denies hearing loss, neck lumps or masses, mouth sores, ringing in ears or voice changes. Denies cough or  wheezing.  Denies shortness of breath. Denies chest pain or palpitations. Denies leg swelling. Denies abdominal distention, pain, blood in stools, constipation, diarrhea, nausea, vomiting, or early satiety. Denies pain with intercourse,  dysuria, frequency, hematuria or incontinence. Denies hot flashes, pelvic pain, vaginal bleeding or vaginal discharge.   Denies joint pain, back pain or muscle pain/cramps. Denies itching, rash, or wounds. Denies dizziness, headaches, numbness or seizures. Denies swollen lymph nodes or glands, denies easy bruising or bleeding. Denies anxiety, depression, confusion, or decreased concentration.  Physical Exam: BP (!) 140/61 (BP Location: Right Arm, Patient Position: Sitting)   Pulse 71   Temp 97.9 F (36.6 C) (Oral)   Resp 20   Wt 130 lb (59 kg)   SpO2 94%   BMI 23.78 kg/m  General: Alert, oriented, no acute distress. HEENT: Normocephalic, atraumatic, sclera anicteric. Chest: Clear to auscultation bilaterally.  No wheezes or rhonchi. Cardiovascular: Regular rate and rhythm, no murmurs. Abdomen: soft, nontender.  Normoactive bowel sounds.  No masses or hepatosplenomegaly appreciated.  Well-healed scar. Extremities: Grossly normal range of motion.  Warm, well perfused.  No edema bilaterally. Skin: No rashes or lesions noted. Lymphatics: No cervical, supraclavicular, or inguinal adenopathy. GU: Normal appearing external genitalia without erythema, excoriation, or lesions.  Speculum exam reveals mildly atrophic vaginal mucosa, cuff intact, no lesions, bleeding, or discharge.  Bimanual exam reveals cuff intact and smooth, no nodularity or masses.  Rectovaginal exam confirms findings.  Laboratory & Radiologic Studies: None new  Assessment & Plan: IDARA WOODSIDE is a 83 y.o. 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  PET imaging notable for hypermetabolic activity and right axillary lymph nodes. 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  She has recurrence diagnosed in 09/2018 - Vaginal biopsy and ultrasound-guided biopsy of the right axillary node were positive for metastatic uterine serous cancer. S/p taxol /carbo, herceptin  completed 02/2019, has been on maintenance herceptin  since.  Last imaging 10/2023 with no evidence of metastatic or recurrent disease.  Stable bilateral pulmonary nodules and small hepatic lesions.  Stable mass in the intramural area in the gastric fundus (has previously undergone endoscopy).  Patient is overall doing very well, NED on exam.   Plan for follow-up in 6 months with me per her request.  We will discuss at that visit spacing visits out to yearly.  Sees Dr. Lonn regularly given maintenance therapy.  20 minutes of total time was spent for this patient encounter, including preparation, face-to-face counseling with the patient and coordination of care, and documentation of the encounter.  Comer Dollar, MD  Division of Gynecologic Oncology  Department of Obstetrics and Gynecology  [facility]      [1]  Current Outpatient Medications:    acetaminophen  (TYLENOL ) 500 MG tablet, Take 1,000 mg by mouth every 6 (six) hours as needed., Disp: , Rfl:    amLODipine (NORVASC) 5 MG tablet, Take 10 mg by mouth daily., Disp: , Rfl:    aspirin  EC 81 MG tablet, Take 81 mg by mouth daily., Disp: , Rfl:    atorvastatin  (LIPITOR) 20 MG tablet, Take 20 mg by mouth daily., Disp: , Rfl:    citalopram  (CELEXA ) 20 MG tablet, Take 20 mg by mouth daily., Disp: , Rfl:    estradiol  (ESTRACE ) 0.1 MG/GM vaginal cream, Place 1 Applicatorful vaginally 3 (three) times a week., Disp: 42.5 g, Rfl: 12   levothyroxine  (SYNTHROID , LEVOTHROID) 100 MCG tablet, Take 100 mcg by mouth daily before breakfast. , Disp: , Rfl:    lidocaine -prilocaine  (EMLA ) cream, Apply 1 Application topically as needed., Disp: 30 g, Rfl: 3   metoprolol  succinate (TOPROL-XL) 50 MG 24 hr tablet, Take 50 mg by mouth daily., Disp: , Rfl:    Multiple Vitamin (MULTIVITAMIN WITH MINERALS) TABS, Take 1 tablet by mouth daily., Disp: , Rfl:    pantoprazole  (PROTONIX ) 40 MG tablet, Take 40 mg by mouth daily., Disp: , Rfl:    valACYclovir (VALTREX) 1000 MG tablet, valacyclovir 1 gram tablet, Disp: , Rfl:

## 2024-07-03 ENCOUNTER — Other Ambulatory Visit: Payer: Self-pay

## 2024-07-16 NOTE — Progress Notes (Signed)
 Vicki Cervantes                                          MRN: [id number]   07/16/2024   The [facility] Quality Team Specialist reviewed this patient medical record for the purposes of chart review for care gap closure. The following were reviewed: chart review for care gap closure-controlling blood pressure.    [facility] Quality Team

## 2024-07-20 ENCOUNTER — Encounter: Payer: Self-pay | Admitting: Hematology and Oncology

## 2024-07-20 ENCOUNTER — Inpatient Hospital Stay: Attending: Gynecologic Oncology | Admitting: Hematology and Oncology

## 2024-07-20 ENCOUNTER — Inpatient Hospital Stay

## 2024-07-20 VITALS — BP 144/56 | HR 67 | Temp 99.1°F | Resp 18 | Ht 62.0 in | Wt 133.4 lb

## 2024-07-20 VITALS — Temp 97.8°F

## 2024-07-20 DIAGNOSIS — C773 Secondary and unspecified malignant neoplasm of axilla and upper limb lymph nodes: Secondary | ICD-10-CM | POA: Insufficient documentation

## 2024-07-20 DIAGNOSIS — Z7189 Other specified counseling: Secondary | ICD-10-CM

## 2024-07-20 DIAGNOSIS — Z5112 Encounter for antineoplastic immunotherapy: Secondary | ICD-10-CM | POA: Diagnosis present

## 2024-07-20 DIAGNOSIS — Z8542 Personal history of malignant neoplasm of other parts of uterus: Secondary | ICD-10-CM | POA: Diagnosis present

## 2024-07-20 DIAGNOSIS — C541 Malignant neoplasm of endometrium: Secondary | ICD-10-CM

## 2024-07-20 DIAGNOSIS — Z853 Personal history of malignant neoplasm of breast: Secondary | ICD-10-CM | POA: Diagnosis not present

## 2024-07-20 MED ORDER — SODIUM CHLORIDE 0.9 % IV SOLN: Freq: Once | INTRAVENOUS | Status: AC

## 2024-07-20 MED ORDER — ESTRADIOL 0.01 % VA CREA: 1.0000 | TOPICAL_CREAM | VAGINAL | 12 refills | Status: AC

## 2024-07-20 MED ORDER — TRASTUZUMAB-ANNS CHEMO 150 MG IV SOLR
6.0000 mg/kg | Freq: Once | INTRAVENOUS | Status: AC
  Administered 2024-07-20: 357 mg via INTRAVENOUS
  Filled 2024-07-20: qty 17

## 2024-07-20 NOTE — Assessment & Plan Note (Addendum)
 The patient was originally diagnosed with uterine cancer in 2018 status post surgery, on background history of left breast cancer.  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  She tolerated treatment well without major side effects Recent CT imaging from April 2025 showed no evidence of disease progression The plan will be to continue treatment indefinitely Recent echocardiogram in September showed preserved ejection fraction Her next echocardiogram will be 4 months, due this month  Her next imaging study would be once a year in April 2026 We will proceed with treatment without delay

## 2024-07-20 NOTE — Progress Notes (Signed)
 [facility] OFFICE PROGRESS NOTE  Patient Care Team: Shepard Ade, [name]  Assessment & Plan Endometrial cancer [facility]) The patient was originally diagnosed with uterine cancer in 2018 status post surgery, on background history of left breast cancer.  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  She tolerated treatment well without major side effects Recent CT imaging from April 2025 showed no evidence of disease progression The plan will be to continue treatment indefinitely Recent echocardiogram in September showed preserved ejection fraction Her next echocardiogram will be 4 months, due this month  Her next imaging study would be once a year in April 2026 We will proceed with treatment without delay 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)  Goals of care, counseling/discussion   Orders Placed This Encounter  Procedures   CMP (Cancer Center only)    Standing Status:   Future    Expiration Date:   07/20/2025   CBC with Differential (Cancer Center Only)    Standing Status:   Future    Expiration Date:   07/20/2025     Almarie Bedford, MD  INTERVAL HISTORY: she returns for treatment follow-up Complications related to previous cycle of chemotherapy included none  PHYSICAL EXAMINATION: ECOG PERFORMANCE STATUS: 0 - Asymptomatic  No results found for: [id number]    Latest Ref Rng & Units 10/14/2023    8:16 AM 09/02/2023   11:29 AM 08/13/2023    9:12 AM  CBC  WBC 4.0 - 10.5 K/uL 6.1  6.5  6.2   Hemoglobin 12.0 - 15.0 g/dL 89.2  88.4  88.6   Hematocrit 36.0 - 46.0 % 30.2  33.1  31.9   Platelets 150 - 400 K/uL 241  227  239       Chemistry       Component Value Date/Time   NA 139 10/14/2023 0816   NA 139 02/13/2017 1512   K 3.6 10/14/2023 0816   K 4.2 02/13/2017 1512   CL 102 10/14/2023 0816   CO2 28 10/14/2023 0816   CO2 28 02/13/2017 1512   BUN 26 (H) 10/14/2023 0816   BUN 25.2 02/13/2017 1512   CREATININE 1.24 (H) 10/14/2023 0816   CREATININE 1.20 (H) 11/05/2022 1215   CREATININE 1.2 (H) 02/13/2017 1512      Component Value Date/Time   CALCIUM  8.6 (L) 10/14/2023 0816   CALCIUM  10.0 02/13/2017 1512   ALKPHOS 63 10/14/2023 0816   AST 18 10/14/2023 0816   AST 19 11/05/2022 1215   ALT 14 10/14/2023 0816   ALT 12 11/05/2022 1215   BILITOT 0.4 10/14/2023 0816   BILITOT 0.4 11/05/2022 1215       Vitals:   07/20/24 1246  BP: (!) 144/56  Pulse: 67  Resp: 18  Temp: 99.1 F (37.3 C)  SpO2: 96%   Filed Weights   07/20/24 1246  Weight: 133 lb 6.4 oz (60.5 kg)

## 2024-08-09 ENCOUNTER — Ambulatory Visit (HOSPITAL_COMMUNITY): Admission: RE | Admit: 2024-08-09 | Source: Ambulatory Visit

## 2024-08-10 ENCOUNTER — Inpatient Hospital Stay

## 2024-08-10 ENCOUNTER — Other Ambulatory Visit: Payer: Self-pay | Admitting: Hematology and Oncology

## 2024-08-10 VITALS — BP 148/56 | HR 69 | Temp 97.7°F | Resp 18 | Wt 132.1 lb

## 2024-08-10 DIAGNOSIS — Z7189 Other specified counseling: Secondary | ICD-10-CM

## 2024-08-10 DIAGNOSIS — C541 Malignant neoplasm of endometrium: Secondary | ICD-10-CM

## 2024-08-10 DIAGNOSIS — Z5112 Encounter for antineoplastic immunotherapy: Secondary | ICD-10-CM | POA: Diagnosis not present

## 2024-08-10 DIAGNOSIS — C773 Secondary and unspecified malignant neoplasm of axilla and upper limb lymph nodes: Secondary | ICD-10-CM

## 2024-08-10 MED ORDER — TRASTUZUMAB-ANNS CHEMO 150 MG IV SOLR
6.0000 mg/kg | Freq: Once | INTRAVENOUS | Status: AC
  Administered 2024-08-10: 357 mg via INTRAVENOUS
  Filled 2024-08-10: qty 17

## 2024-08-10 MED ORDER — SODIUM CHLORIDE 0.9 % IV SOLN: Freq: Once | INTRAVENOUS | Status: AC

## 2024-08-27 ENCOUNTER — Ambulatory Visit (HOSPITAL_COMMUNITY)

## 2024-08-31 ENCOUNTER — Inpatient Hospital Stay

## 2024-08-31 ENCOUNTER — Inpatient Hospital Stay: Admitting: Hematology and Oncology

## 2024-12-31 ENCOUNTER — Inpatient Hospital Stay: Admitting: Gynecologic Oncology
